# Patient Record
Sex: Male | Born: 1950
Health system: Southern US, Community
[De-identification: ages and names within clinical notes are randomized; demographics above are authoritative.]

## PROBLEM LIST (undated history)

## (undated) DIAGNOSIS — C801 Malignant (primary) neoplasm, unspecified: Secondary | ICD-10-CM

## (undated) DIAGNOSIS — Q231 Congenital insufficiency of aortic valve: Secondary | ICD-10-CM

## (undated) DIAGNOSIS — I350 Nonrheumatic aortic (valve) stenosis: Secondary | ICD-10-CM

## (undated) DIAGNOSIS — I2510 Atherosclerotic heart disease of native coronary artery without angina pectoris: Secondary | ICD-10-CM

## (undated) DIAGNOSIS — I10 Essential (primary) hypertension: Secondary | ICD-10-CM

## (undated) DIAGNOSIS — C61 Malignant neoplasm of prostate: Secondary | ICD-10-CM

## (undated) DIAGNOSIS — R519 Headache, unspecified: Secondary | ICD-10-CM

## (undated) DIAGNOSIS — I726 Aneurysm of vertebral artery: Secondary | ICD-10-CM

## (undated) DIAGNOSIS — S065X9A Traumatic subdural hemorrhage with loss of consciousness of unspecified duration, initial encounter: Secondary | ICD-10-CM

## (undated) DIAGNOSIS — Q2381 Bicuspid aortic valve: Secondary | ICD-10-CM

## (undated) DIAGNOSIS — S065XAA Traumatic subdural hemorrhage with loss of consciousness status unknown, initial encounter: Secondary | ICD-10-CM

## (undated) DIAGNOSIS — Z8719 Personal history of other diseases of the digestive system: Secondary | ICD-10-CM

## (undated) DIAGNOSIS — Z952 Presence of prosthetic heart valve: Secondary | ICD-10-CM

## (undated) DIAGNOSIS — M1990 Unspecified osteoarthritis, unspecified site: Secondary | ICD-10-CM

## (undated) DIAGNOSIS — I7121 Aneurysm of the ascending aorta, without rupture: Secondary | ICD-10-CM

## (undated) DIAGNOSIS — K219 Gastro-esophageal reflux disease without esophagitis: Secondary | ICD-10-CM

## (undated) HISTORY — DX: Bicuspid aortic valve: Q23.81

## (undated) HISTORY — DX: Aneurysm of vertebral artery: I72.6

## (undated) HISTORY — DX: Essential (primary) hypertension: I10

## (undated) HISTORY — PX: VASCULAR SURGERY: SHX849

## (undated) HISTORY — PX: CARDIAC CATHETERIZATION: SHX172

## (undated) HISTORY — PX: KNEE ARTHROPLASTY: SHX992

## (undated) HISTORY — DX: Presence of prosthetic heart valve: Z95.2

## (undated) HISTORY — PX: SHOULDER SURGERY: SHX246

## (undated) HISTORY — DX: Malignant neoplasm of prostate: C61

## (undated) HISTORY — DX: Aneurysm of the ascending aorta, without rupture: I71.21

## (undated) HISTORY — DX: Atherosclerotic heart disease of native coronary artery without angina pectoris: I25.10

## (undated) HISTORY — DX: Nonrheumatic aortic (valve) stenosis: I35.0

## (undated) HISTORY — PX: CARPAL TUNNEL RELEASE: SHX101

## (undated) HISTORY — DX: Congenital insufficiency of aortic valve: Q23.1

---

## 1999-03-07 ENCOUNTER — Ambulatory Visit (HOSPITAL_BASED_OUTPATIENT_CLINIC_OR_DEPARTMENT_OTHER): Admission: RE | Admit: 1999-03-07 | Discharge: 1999-03-07 | Payer: Self-pay | Admitting: Orthopedic Surgery

## 2000-04-25 ENCOUNTER — Encounter: Payer: Self-pay | Admitting: Orthopedic Surgery

## 2000-04-25 ENCOUNTER — Ambulatory Visit (HOSPITAL_COMMUNITY): Admission: RE | Admit: 2000-04-25 | Discharge: 2000-04-25 | Payer: Self-pay | Admitting: Orthopedic Surgery

## 2001-05-14 ENCOUNTER — Encounter: Payer: Self-pay | Admitting: Gastroenterology

## 2001-05-14 ENCOUNTER — Encounter: Admission: RE | Admit: 2001-05-14 | Discharge: 2001-05-14 | Payer: Self-pay | Admitting: Gastroenterology

## 2002-07-28 ENCOUNTER — Observation Stay (HOSPITAL_COMMUNITY): Admission: EM | Admit: 2002-07-28 | Discharge: 2002-07-28 | Payer: Self-pay | Admitting: *Deleted

## 2002-07-28 ENCOUNTER — Encounter: Payer: Self-pay | Admitting: *Deleted

## 2003-11-05 ENCOUNTER — Emergency Department (HOSPITAL_COMMUNITY): Admission: EM | Admit: 2003-11-05 | Discharge: 2003-11-05 | Payer: Self-pay | Admitting: Emergency Medicine

## 2004-03-02 ENCOUNTER — Ambulatory Visit (HOSPITAL_COMMUNITY): Admission: RE | Admit: 2004-03-02 | Discharge: 2004-03-02 | Payer: Self-pay | Admitting: General Surgery

## 2004-03-02 ENCOUNTER — Ambulatory Visit (HOSPITAL_BASED_OUTPATIENT_CLINIC_OR_DEPARTMENT_OTHER): Admission: RE | Admit: 2004-03-02 | Discharge: 2004-03-02 | Payer: Self-pay | Admitting: General Surgery

## 2004-03-02 ENCOUNTER — Encounter (INDEPENDENT_AMBULATORY_CARE_PROVIDER_SITE_OTHER): Payer: Self-pay | Admitting: *Deleted

## 2004-08-15 ENCOUNTER — Ambulatory Visit (HOSPITAL_COMMUNITY): Admission: RE | Admit: 2004-08-15 | Discharge: 2004-08-15 | Payer: Self-pay | Admitting: Gastroenterology

## 2005-11-25 ENCOUNTER — Encounter: Admission: RE | Admit: 2005-11-25 | Discharge: 2005-11-25 | Payer: Self-pay | Admitting: General Surgery

## 2005-12-12 ENCOUNTER — Emergency Department (HOSPITAL_COMMUNITY): Admission: EM | Admit: 2005-12-12 | Discharge: 2005-12-12 | Payer: Self-pay | Admitting: Emergency Medicine

## 2006-07-11 ENCOUNTER — Ambulatory Visit: Payer: Self-pay | Admitting: Family Medicine

## 2006-07-25 ENCOUNTER — Ambulatory Visit: Payer: Self-pay | Admitting: Internal Medicine

## 2006-08-06 ENCOUNTER — Ambulatory Visit: Payer: Self-pay | Admitting: Family Medicine

## 2006-08-23 ENCOUNTER — Ambulatory Visit: Payer: Self-pay | Admitting: Family Medicine

## 2006-09-06 ENCOUNTER — Emergency Department (HOSPITAL_COMMUNITY): Admission: EM | Admit: 2006-09-06 | Discharge: 2006-09-06 | Payer: Self-pay | Admitting: Emergency Medicine

## 2006-09-07 ENCOUNTER — Ambulatory Visit: Payer: Self-pay | Admitting: Family Medicine

## 2006-12-06 ENCOUNTER — Ambulatory Visit: Payer: Self-pay | Admitting: Family Medicine

## 2007-01-08 DIAGNOSIS — M109 Gout, unspecified: Secondary | ICD-10-CM | POA: Insufficient documentation

## 2007-01-13 DIAGNOSIS — Z8739 Personal history of other diseases of the musculoskeletal system and connective tissue: Secondary | ICD-10-CM | POA: Insufficient documentation

## 2007-03-01 ENCOUNTER — Ambulatory Visit: Payer: Self-pay | Admitting: Family Medicine

## 2007-03-01 LAB — CONVERTED CEMR LAB
CO2: 31 meq/L (ref 19–32)
Chloride: 106 meq/L (ref 96–112)
Cholesterol: 220 mg/dL (ref 0–200)
Creatinine, Ser: 1.1 mg/dL (ref 0.4–1.5)
PSA: 2.31 ng/mL (ref 0.10–4.00)
Sodium: 144 meq/L (ref 135–145)
Uric Acid, Serum: 8.5 mg/dL — ABNORMAL HIGH (ref 2.4–7.0)
VLDL: 31 mg/dL (ref 0–40)

## 2007-04-22 ENCOUNTER — Ambulatory Visit: Payer: Self-pay | Admitting: Family Medicine

## 2007-07-10 ENCOUNTER — Telehealth (INDEPENDENT_AMBULATORY_CARE_PROVIDER_SITE_OTHER): Payer: Self-pay | Admitting: *Deleted

## 2007-07-11 ENCOUNTER — Telehealth (INDEPENDENT_AMBULATORY_CARE_PROVIDER_SITE_OTHER): Payer: Self-pay | Admitting: *Deleted

## 2007-07-12 ENCOUNTER — Ambulatory Visit: Payer: Self-pay | Admitting: Family Medicine

## 2007-07-12 DIAGNOSIS — N63 Unspecified lump in unspecified breast: Secondary | ICD-10-CM | POA: Insufficient documentation

## 2007-07-17 ENCOUNTER — Encounter: Admission: RE | Admit: 2007-07-17 | Discharge: 2007-07-17 | Payer: Self-pay | Admitting: Family Medicine

## 2007-11-25 ENCOUNTER — Telehealth (INDEPENDENT_AMBULATORY_CARE_PROVIDER_SITE_OTHER): Payer: Self-pay | Admitting: *Deleted

## 2007-11-26 ENCOUNTER — Ambulatory Visit: Payer: Self-pay | Admitting: Family Medicine

## 2007-12-09 ENCOUNTER — Telehealth (INDEPENDENT_AMBULATORY_CARE_PROVIDER_SITE_OTHER): Payer: Self-pay | Admitting: *Deleted

## 2007-12-16 ENCOUNTER — Telehealth (INDEPENDENT_AMBULATORY_CARE_PROVIDER_SITE_OTHER): Payer: Self-pay | Admitting: *Deleted

## 2007-12-16 ENCOUNTER — Ambulatory Visit: Payer: Self-pay | Admitting: Internal Medicine

## 2007-12-16 DIAGNOSIS — N410 Acute prostatitis: Secondary | ICD-10-CM | POA: Insufficient documentation

## 2007-12-16 LAB — CONVERTED CEMR LAB
Bilirubin Urine: NEGATIVE
Glucose, Urine, Semiquant: NEGATIVE
Specific Gravity, Urine: 1.015
pH: 5

## 2007-12-20 ENCOUNTER — Encounter (INDEPENDENT_AMBULATORY_CARE_PROVIDER_SITE_OTHER): Payer: Self-pay | Admitting: *Deleted

## 2007-12-25 ENCOUNTER — Ambulatory Visit: Payer: Self-pay | Admitting: Family Medicine

## 2007-12-31 ENCOUNTER — Encounter (INDEPENDENT_AMBULATORY_CARE_PROVIDER_SITE_OTHER): Payer: Self-pay | Admitting: *Deleted

## 2007-12-31 ENCOUNTER — Telehealth (INDEPENDENT_AMBULATORY_CARE_PROVIDER_SITE_OTHER): Payer: Self-pay | Admitting: *Deleted

## 2007-12-31 LAB — CONVERTED CEMR LAB
CO2: 29 meq/L (ref 19–32)
GFR calc Af Amer: 80 mL/min
Glucose, Bld: 83 mg/dL (ref 70–99)
HDL: 66.4 mg/dL (ref >39.0)
Potassium: 4.9 meq/L (ref 3.5–5.1)
Sodium: 140 meq/L (ref 135–145)
Triglycerides: 126 mg/dL (ref 0–149)

## 2008-01-02 ENCOUNTER — Encounter (INDEPENDENT_AMBULATORY_CARE_PROVIDER_SITE_OTHER): Payer: Self-pay | Admitting: *Deleted

## 2008-01-03 ENCOUNTER — Telehealth (INDEPENDENT_AMBULATORY_CARE_PROVIDER_SITE_OTHER): Payer: Self-pay | Admitting: *Deleted

## 2008-01-07 ENCOUNTER — Encounter: Payer: Self-pay | Admitting: Internal Medicine

## 2008-01-16 ENCOUNTER — Encounter (INDEPENDENT_AMBULATORY_CARE_PROVIDER_SITE_OTHER): Payer: Self-pay | Admitting: Family Medicine

## 2008-02-14 ENCOUNTER — Ambulatory Visit: Payer: Self-pay | Admitting: Family Medicine

## 2008-02-14 DIAGNOSIS — F4329 Adjustment disorder with other symptoms: Secondary | ICD-10-CM | POA: Insufficient documentation

## 2008-02-14 DIAGNOSIS — M62838 Other muscle spasm: Secondary | ICD-10-CM | POA: Insufficient documentation

## 2008-02-17 LAB — CONVERTED CEMR LAB
Chloride: 104 meq/L (ref 96–112)
Creatinine, Ser: 1.3 mg/dL (ref 0.4–1.5)
Glucose, Bld: 127 mg/dL — ABNORMAL HIGH (ref 70–99)
Sodium: 143 meq/L (ref 135–145)

## 2008-02-18 ENCOUNTER — Telehealth (INDEPENDENT_AMBULATORY_CARE_PROVIDER_SITE_OTHER): Payer: Self-pay | Admitting: *Deleted

## 2008-02-20 ENCOUNTER — Encounter (INDEPENDENT_AMBULATORY_CARE_PROVIDER_SITE_OTHER): Payer: Self-pay | Admitting: *Deleted

## 2008-02-27 ENCOUNTER — Telehealth (INDEPENDENT_AMBULATORY_CARE_PROVIDER_SITE_OTHER): Payer: Self-pay | Admitting: *Deleted

## 2008-03-10 ENCOUNTER — Ambulatory Visit: Payer: Self-pay | Admitting: Internal Medicine

## 2008-03-10 DIAGNOSIS — T887XXA Unspecified adverse effect of drug or medicament, initial encounter: Secondary | ICD-10-CM | POA: Insufficient documentation

## 2008-03-10 DIAGNOSIS — Q828 Other specified congenital malformations of skin: Secondary | ICD-10-CM | POA: Insufficient documentation

## 2008-04-13 ENCOUNTER — Telehealth: Payer: Self-pay | Admitting: Internal Medicine

## 2008-12-02 ENCOUNTER — Ambulatory Visit: Payer: Self-pay | Admitting: Family Medicine

## 2008-12-02 LAB — CONVERTED CEMR LAB: Uric Acid, Serum: 9.9 mg/dL — ABNORMAL HIGH (ref 4.0–7.8)

## 2008-12-03 ENCOUNTER — Encounter (INDEPENDENT_AMBULATORY_CARE_PROVIDER_SITE_OTHER): Payer: Self-pay | Admitting: *Deleted

## 2009-01-08 ENCOUNTER — Encounter: Payer: Self-pay | Admitting: Family Medicine

## 2009-01-23 ENCOUNTER — Telehealth: Payer: Self-pay | Admitting: Family Medicine

## 2009-01-25 ENCOUNTER — Ambulatory Visit: Payer: Self-pay | Admitting: Family Medicine

## 2009-01-25 DIAGNOSIS — R109 Unspecified abdominal pain: Secondary | ICD-10-CM | POA: Insufficient documentation

## 2009-01-25 DIAGNOSIS — R319 Hematuria, unspecified: Secondary | ICD-10-CM | POA: Insufficient documentation

## 2009-01-25 LAB — CONVERTED CEMR LAB
ALT: 28 units/L (ref 0–53)
Albumin: 3.8 g/dL (ref 3.5–5.2)
BUN: 12 mg/dL (ref 6–23)
Basophils Absolute: 0.1 10*3/uL (ref 0.0–0.1)
Basophils Relative: 0.9 % (ref 0.0–3.0)
CO2: 31 meq/L (ref 19–32)
Calcium: 9.4 mg/dL (ref 8.4–10.5)
Creatinine, Ser: 1.2 mg/dL (ref 0.4–1.5)
Eosinophils Relative: 1.3 % (ref 0.0–5.0)
Glucose, Bld: 91 mg/dL (ref 70–99)
Hemoglobin: 14.2 g/dL (ref 13.0–17.0)
Ketones, urine, test strip: NEGATIVE
Lymphocytes Relative: 10.6 % — ABNORMAL LOW (ref 12.0–46.0)
MCHC: 34.3 g/dL (ref 30.0–36.0)
Neutro Abs: 8.4 10*3/uL — ABNORMAL HIGH (ref 1.4–7.7)
Nitrite: NEGATIVE
RBC: 4.38 M/uL (ref 4.22–5.81)
Sed Rate: 77 mm/hr — ABNORMAL HIGH (ref 0–16)
Total Bilirubin: 0.9 mg/dL (ref 0.3–1.2)
Total Protein: 7.1 g/dL (ref 6.0–8.3)
Urobilinogen, UA: 0.2

## 2009-01-26 ENCOUNTER — Encounter: Payer: Self-pay | Admitting: Family Medicine

## 2009-01-27 ENCOUNTER — Telehealth (INDEPENDENT_AMBULATORY_CARE_PROVIDER_SITE_OTHER): Payer: Self-pay | Admitting: *Deleted

## 2009-01-28 ENCOUNTER — Encounter: Payer: Self-pay | Admitting: Family Medicine

## 2009-11-02 ENCOUNTER — Telehealth: Payer: Self-pay | Admitting: Family Medicine

## 2011-01-07 ENCOUNTER — Encounter: Payer: Self-pay | Admitting: General Surgery

## 2011-05-05 NOTE — Assessment & Plan Note (Signed)
[facility]                        [facility] OFFICE NOTE   NAME:Jose [name], Jose [name]                      MRN:          [id number]  DATE:04/22/2007                            DOB:          1951-11-15    REASON FOR VISIT:  Cough x3-4 weeks.   Mr. Terhune is a 60 year old male who is having recurrent cough over the  last 3-4 weeks.  He reports that it becomes very productive in nature.  Sometimes, he gets into a significant fit that he cannot stop  coughing.  He gets short of breath with these episodes.  Several weeks  ago, he reported that he was coughing so heavily and hard that he  passed out.  He has not had any recurrence of that.  The patient does  report significant post nasal drip with nasal congestion.  He also  complains of sneezing.  The patient is not taking anything for  allergies.  He denies any fever or chills.   The patient also has a history of gout and complaints of gout flare up  of his right foot.  He states usually when he misses a couple of doses  of his Allopurinol or eats food that helps precipitate his gout, he gets  a flare up.  Of note, Mr. Parke has not followed up as recommended  with rheumatology as of yet.   MEDICATIONS:  Please see medication list.   ALLERGIES:  No known drug allergies.   REVIEW OF SYSTEMS:  As per HPI.  The patient denies any chest pain,  palpitations, nausea, vomiting or diarrhea.   OBJECTIVE:  VITAL SIGNS:  Weight 206.8, temperature 98.7, pulse 76,  respirations 18, blood pressure 120/90.  GENERAL:  A pleasant male in no acute distress with minimal coughing  during the examination.  HEENT:  Nasal mucosal was very swollen with cobblestone appearance,  clear to yellow nasal discharge.  Oropharynx slightly erythematous with  post nasal drip.  NECK:  Supple.  No lymphadenopathy, carotid bruits or JVD.  LUNGS:  Clear with good air movement.  No rhonchi, wheezing or crackles.  HEART:   Regular rate and rhythm, normal S1, S2, no murmurs, rubs or  gallops.  EXTREMITIES:  Examination of the right foot is significant for increased  redness around the first digit, mild swelling up to the base of the  first metatarsal, mildly increased warmth.   IMPRESSION:  8. A 60 year old male with recurrent history of coughing.  It appears      that it is complicated with allergic rhinitis.  2. Gout of the great right toe similar to previous occurrence.   PLAN:  1. In regards to his cough, I advised the patient will treat      empirically as allergic rhinitis.  The patient will start on      Allegra 180 mg daily.  I also provided samples of Nasonex two      squirts in each nostril daily.  Will also treat with Z-pack given      the history of previous bronchitis.  2. Advise the patient that I will refer to  pulmonology for further      assessment given the repetitive nature of his symptoms.  3. In regards to his gout, I will refill the patient's Indocin 50 mg      t.i.d. p.r.n.  He will hold off on the Allopurinol for now and      restart in 7-10 days.  I advised the patient if the symptoms worsen      or do not improve, he is to follow up.  4. Advise the patient that he should follow up with rheumatology as      previously advised.  5. I did review my medical records and it shows that I referred the      patient to allergy and asthma on January 24, 2007, but it does not      appear that Mr. Defrank went to that appointment based on our      discussion today.     Leanne Chang, M.D.  Electronically Signed    LA/MedQ  DD: 04/22/2007  DT: 04/23/2007  Job #: [id number]

## 2011-05-05 NOTE — Assessment & Plan Note (Signed)
[facility]                        [facility] OFFICE NOTE   NAME:Jose [name], Jose [name]                      MRN:          [id number]  DATE:03/01/2007                            DOB:          Feb 03, 1951    REASON FOR VISIT:  Would like his prostate checked.   Mr. Strollo is a 60 year old male who presents today reporting that he  wants a prostate screen.  Additionally he would like to have his uric  acid level checked.  He does have a history of gout which has not flared  up in a while.   The patient also has a family history of diabetes and would like a  fasting glucose performed as well.   The patient also reports that he has been having trouble with allergies.  Complains of sneezing, postnasal drip, and a tickle in his throat that  causes a cough.  The patient states that he prefers not to take any  medications for his allergies.  He describes the symptoms as an  annoyance.   MEDICATIONS:  Allopurinol 100 mg b.i.d.   ALLERGIES:  NO KNOWN DRUG ALLERGIES.   REVIEW OF SYSTEMS:  As per HPI, otherwise unremarkable.   OBJECTIVE:  Weight 208.8, pulse 78, blood pressure 120/84.  We have a  pleasant male in no acute distress, asks questions appropriately.  HEENT:  Nasal mucosa was foggy, cobblestone appearance with clear to  yellow nasal discharge.  Oropharynx benign except for postnasal drip.  LUNGS:  Clear.  HEART:  Regular rate and rhythm.  RECTAL:  Significant for normal tone, prostate within normal limits, no  palpable masses.   IMPRESSION:  1. Allergic rhinitis.  2. Prostate cancer screen.  3. Family history of diabetes.   PLAN:  1. Refill the patient's prescription for Allopurinol 100 mg b.i.d.      with 6 refills.  Will check a BMET and uric acid level.  2. In regards to family history of diabetes, we will do a fasting      glucose.  Additionally we will do a fasting lipid profile.  3. In regards to his allergies the patient did  agree to try a nasal      spray.  Did provide samples of      Nasacort AQ, 2 squirts in each nostril daily.  The patient to call      the office in 10 days to let      [place] know if medication is helping.  4. The patient to follow up as needed in the interim.     Leanne Chang, M.D.  Electronically Signed    LA/MedQ  DD: 03/01/2007  DT: 03/02/2007  Job #: [id number]

## 2011-05-05 NOTE — Op Note (Signed)
NAME:  Jose [name], Jose [name]                         ACCOUNT NO.:  [id number]   MEDICAL RECORD NO.:  [id number]                   PATIENT TYPE:  AMB   LOCATION:  ENDO                                 FACILITY:  [facility]   PHYSICIAN:  James L. Malon Kindle., M.D.          DATE OF BIRTH:  [birth year]   DATE OF PROCEDURE:  08/15/2004  DATE OF DISCHARGE:                                 OPERATIVE REPORT   PROCEDURE:  Esophagogastroduodenoscopy.   MEDICATIONS:  Cetacaine spray, fentanyl 50 mcg, Versed 6 mg IV.   INDICATIONS:  Worsened esophageal reflux and pain, heartburn daily.   DESCRIPTION OF PROCEDURE:  The procedure had been explained to the patient  and consent obtained.  With the patient in the left lateral decubitus  position, the Olympus scope was inserted and advanced under direct  visualization.  The esophagus was entered.  The distal esophagus was  slightly inflamed, and right at the GE junction was a shallow ulcer with  smooth borders consistent with a reflux-induced ulcer.  The GE junction was  widely patent.  There was a small hiatal hernia.  The stomach was entered,  the pylorus identified and passed.  The duodenum, including the bulb and  second portion, were seen well and were unremarkable.  The scope was  withdrawn back in the stomach.  The pyloric channel, antrum, and body of the  stomach were normal.  The fundus and cardia were seen well on the  retroflexed view and were normal.  The scope was withdrawn.  Initial  findings on entry were confirmed.  The proximal esophagus was seen well and  was normal.  The scope was withdrawn.  The patient tolerated the procedure  well.   ASSESSMENT:  Esophageal ulcer, probably secondary to reflux.   PLAN:  Will continue on Aciphex and Reglan, give antireflux instructions.  Will see back in the office in six weeks.                                               James L. Malon Kindle., M.D.    Waldron Session  D:  08/15/2004  T:  08/16/2004  Job:   [id number]   cc:   Leanne Chang, M.D.  [address]  [place]  [place] [address]  Fax: [phone]

## 2011-05-05 NOTE — Cardiovascular Report (Signed)
NAME:  Jose [name], Jose [name]                         ACCOUNT NO.:  [id number]   MEDICAL RECORD NO.:  [id number]                   PATIENT TYPE:  INP   LOCATION:  5735                                 FACILITY:  [facility]   PHYSICIAN:  Thereasa Solo. Little, M.D.              DATE OF BIRTH:  November 21, 1951   DATE OF PROCEDURE:  07/28/2002  DATE OF DISCHARGE:  07/28/2002                              CARDIAC CATHETERIZATION   INDICATIONS FOR PROCEDURE:  The patient is a 60 year old male who developed  left anterior chest pain around 11 o'clock p.m. associated with diaphoresis  and nausea. He states that the pain is completely different from his usual  indigestion pain, was non pleuritic. He presented to the emergency room  around 5 o'clock in the morning.  ECG, no acute changes, but continued to  have chest pain. Because of this, he is brought to the catheterization  laboratory.   DESCRIPTION OF PROCEDURE:  The patient was prepped and draped in the usual  sterile fashion exposing the right groin. Following local anesthetic with 1%  Xylocaine, the Seldinger technique was employed and a 5 [place] introducer  sheath was placed into the right femoral artery. Left and right coronary  arteriography and ventriculography in the RAO projection was performed.   COMPLICATIONS:  None.   EQUIPMENT:  The 5 French Judkins configuration catheters.   RESULTS:  1. Hemodynamic monitoring:  Central aortic pressure 134/80 left ventricular     pressure 134/158 with no aortic valve gradient noted at time of pullback.  2. Ventriculography:  Ventriculography in the RAO projection and LAO     projection using 25 cc of contrast at 12 cc/sec. revealed mild global LV     dysfunction.  The ejection fraction calculated at 48%. The apex seemed to     be slightly more hypokinetic than the remaining segments. The only normal     segment was the anterobasilar segment. No mitral regurgitation was seen.     LV cavity size appeared  normal.   CORONARY ARTERIOGRAPHY:  No calcification was seen on fluoroscopy.  1. Left main:  Normal.  2. LAD:  The LAD extended down to the apex of the heart and the distal LAD     was relatively small in diameter. The mid and proximal LAD was tortuous.     There were two diagonals. This entire system was basically free of     disease.  3. Circumflex:  The circumflex gave rise to one large OM vessel with two     little small OMs coming off in the more proximal segment of the     circumflex. This system was free of disease.  4. Right coronary artery:  The right coronary artery, the posterior     descending artery and the posterolateral branch were free of disease.   CONCLUSION:  1. No coronary artery disease  2. Decreased ejection fraction at 48%  with mild global left ventricular     dysfunction, etiology unclear.    PLAN:  The patient will be discharged today.  The pain appears to be  noncardiac in origin. Will start on low-dose ACE inhibitors to see if we can  increase the LV function.                                                Thereasa Solo. Little, M.D.    ABL/MEDQ  D:  07/28/2002  T:  07/31/2002  Job:  [id number]   cc:   Cardiac Catheterization Laboratory   Lilyan Punt. Sydnee Levans, M.D.

## 2011-05-05 NOTE — Assessment & Plan Note (Signed)
[facility]                                   ON-CALL NOTE   NAME:[name], [name]                      MRN:          [id number]  DATE:08/04/2006                            DOB:          [birth year]    Patient of Willow Ora, MD   The patient is calling because he has had a problem with two weeks of gout.  He had a gouty flare and went to see Dr. Alwyn Ren.  Dr. Alwyn Ren stopped his  allopurinol, put him on Indocin and prednisone pain pills.  He then saw his  primary care physician, Dr. Blossom Hoops, last week, who restarted his  allopurinol and told him to continue to take the prednisone and the Indocin.  His prednisone dose is 5 mg one tablet twice a day.  He is still having gout  but it is now in his finger, where he had old trauma.  He has no symptoms of  an infection.   We will up his prednisone over the weekend.  He may need more steroids to  get the inflammatory response to settle down.  We will give him six 5 mg  tablets, that will be 30 mg now stat, 30 mg Sunday morning, and then he is  to go to the office at 8:15 Monday morning to see his primary care physician  for evaluation.  I advised him he may need that joint injected if oral  medications do not work.  I also called him in Vicodin ES #20 one q.4-6h.  p.r.n. for pain, no refills.  Advised in the future would not be able to  call in narcotics over the weekend.  He would need to get a supply of pain  medicine to keep at home.                                   Jeffrey A. Tawanna Cooler, MD   JAT/MedQ  DD:  08/04/2006  DT:  08/04/2006  Job #:  [id number]   cc:   Willow Ora, MD

## 2011-05-05 NOTE — Assessment & Plan Note (Signed)
[facility]                          [facility] OFFICE NOTE   NAME:Jose [name], Jose [name]                      MRN:          [id number]  DATE:08/06/2006                            DOB:          02/17/1951    REASON FOR VISIT:  Swollen index finger/gout.   Jose [name] is a 60 year old male, who was seen by my partner several weeks  ago, secondary to acute gouty arthritis.  He was started on prednisone, as  well as discontinued from his allopurinol.  Jose [name] reports that, soon  after stopping the allopurinol, he noticed that his arthralgias increased.  He noticed significant swelling in his left index finger and called our  office.  At that point, I went ahead and called in additional prescription  for Indocin for him to take for three more days, since he ran out of it.  After that, he was to start the allopurinol.  Jose [name] reports that he  noticed that the left index finger became more swollen after he restarted  the allopurinol and he called the doctor on call this past weekend.  The  patient was advised to restart the prednisone which he had left over from  the prescription given by Dr. Alwyn Ren.  He states yesterday he had noticed  significant improvement in the left index finger and the pain has improved  slightly.  Of note, this is the finger he had fractured several months ago.   MEDICATIONS:  Indocin and prednisone, which he believes is 5 mg tablets.   OBJECTIVE:  Examination of the left hand is significant for a swollen second  digit from the PIP joint down.  There is no significant increased warmth,  but there is redness and tenderness.   IMPRESSION:  Acute gouty arthritis, responded with one to two-day treatment  of prednisone.   PLAN:  1. We will continue prednisone at 60 mg daily for five days, taper down to      40 for three days, then 20 for two days, then 10 for two days.  We will      have the patient hold allopurinol for  now.  2. I did provide a prescription for Ultram ER 200 mg daily #15 with no      refills.  He was also provided two sample packs.  Side effects were      reviewed with the patient, including sedation.  3. He is to follow up within ten days or sooner, if he noticed worsening      symptoms or no significant improvement over the next 48 hours.                                   Leanne Chang, MD   LA/MedQ  DD:  08/06/2006  DT:  08/07/2006  Job #:  [id number]

## 2011-05-05 NOTE — Op Note (Signed)
NAME:  Jose [name], Jose [name]                         ACCOUNT NO.:  [id number]   MEDICAL RECORD NO.:  [id number]                   PATIENT TYPE:  AMB   LOCATION:  DSC                                  FACILITY:  [facility]   PHYSICIAN:  Ollen Gross. Vernell Morgans, M.D.              DATE OF BIRTH:  [birth year]   DATE OF PROCEDURE:  03/02/2004  DATE OF DISCHARGE:  03/02/2004                                 OPERATIVE REPORT   PREOPERATIVE DIAGNOSES:  Two small masses on the left buttock.   POSTOPERATIVE DIAGNOSES:  Two small masses on the left buttock.   OPERATION PERFORMED:  Excision of two masses from the left buttock.   SURGEON:  Ollen Gross. Carolynne Edouard, M.D.   ANESTHESIA:  Local.   DESCRIPTION OF PROCEDURE:  After informed consent was obtained, the patient  was brought to the operating room and placed in prone position on the  operating table.  The area in question on the patient's left buttock was  prepped with Betadine and draped in the usual sterile manner.  The more  lateral lesion was approximately 1 cm in diameter and the more medial lesion  was about 5 mm in diameter.  Each of these areas was infiltrated with 1%  lidocaine with epinephrine until a good field block was obtained.  Each area  was then excised sharply with a 15 blade knife using an elliptical type  incision which was carried down full thickness into the fat.  Each specimen  was sent to pathology for further evaluation.  Each of the incisions was  closed with interrupted 4-0 Monocryl subcuticular stitches and then covered  with Dermabond dressing.  The patient tolerated the procedure well.  At the  end of the case all sponge, needle and instrument counts were correct.  The  patient was taken to the recovery room in stable condition.                                               Ollen Gross. Vernell Morgans, M.D.    PST/MEDQ  D:  03/05/2004  T:  03/07/2004  Job:  [id number]

## 2014-12-18 HISTORY — PX: BRAIN SURGERY: SHX531

## 2015-02-16 DIAGNOSIS — I6200 Nontraumatic subdural hemorrhage, unspecified: Secondary | ICD-10-CM | POA: Diagnosis not present

## 2015-02-16 DIAGNOSIS — R5383 Other fatigue: Secondary | ICD-10-CM | POA: Diagnosis not present

## 2015-02-16 DIAGNOSIS — I6202 Nontraumatic subacute subdural hemorrhage: Secondary | ICD-10-CM | POA: Diagnosis not present

## 2015-02-16 DIAGNOSIS — R531 Weakness: Secondary | ICD-10-CM | POA: Diagnosis not present

## 2015-02-16 DIAGNOSIS — M109 Gout, unspecified: Secondary | ICD-10-CM | POA: Diagnosis not present

## 2015-02-16 DIAGNOSIS — M79602 Pain in left arm: Secondary | ICD-10-CM | POA: Diagnosis not present

## 2015-02-16 DIAGNOSIS — Z125 Encounter for screening for malignant neoplasm of prostate: Secondary | ICD-10-CM | POA: Diagnosis not present

## 2015-02-16 DIAGNOSIS — R29898 Other symptoms and signs involving the musculoskeletal system: Secondary | ICD-10-CM | POA: Diagnosis not present

## 2015-02-16 DIAGNOSIS — I729 Aneurysm of unspecified site: Secondary | ICD-10-CM | POA: Diagnosis not present

## 2015-02-16 DIAGNOSIS — Z8612 Personal history of poliomyelitis: Secondary | ICD-10-CM | POA: Diagnosis not present

## 2015-02-16 DIAGNOSIS — M6281 Muscle weakness (generalized): Secondary | ICD-10-CM | POA: Diagnosis not present

## 2015-02-16 DIAGNOSIS — B91 Sequelae of poliomyelitis: Secondary | ICD-10-CM | POA: Diagnosis not present

## 2015-02-16 DIAGNOSIS — K449 Diaphragmatic hernia without obstruction or gangrene: Secondary | ICD-10-CM | POA: Diagnosis not present

## 2015-02-16 DIAGNOSIS — I671 Cerebral aneurysm, nonruptured: Secondary | ICD-10-CM | POA: Diagnosis not present

## 2015-02-16 DIAGNOSIS — T148 Other injury of unspecified body region: Secondary | ICD-10-CM | POA: Diagnosis not present

## 2015-02-24 DIAGNOSIS — K449 Diaphragmatic hernia without obstruction or gangrene: Secondary | ICD-10-CM | POA: Diagnosis not present

## 2015-02-24 DIAGNOSIS — I6789 Other cerebrovascular disease: Secondary | ICD-10-CM | POA: Diagnosis not present

## 2015-02-24 DIAGNOSIS — G40109 Localization-related (focal) (partial) symptomatic epilepsy and epileptic syndromes with simple partial seizures, not intractable, without status epilepticus: Secondary | ICD-10-CM | POA: Diagnosis not present

## 2015-02-24 DIAGNOSIS — R4781 Slurred speech: Secondary | ICD-10-CM | POA: Diagnosis not present

## 2015-02-24 DIAGNOSIS — K219 Gastro-esophageal reflux disease without esophagitis: Secondary | ICD-10-CM | POA: Diagnosis not present

## 2015-02-24 DIAGNOSIS — R7989 Other specified abnormal findings of blood chemistry: Secondary | ICD-10-CM | POA: Diagnosis not present

## 2015-02-24 DIAGNOSIS — G459 Transient cerebral ischemic attack, unspecified: Secondary | ICD-10-CM | POA: Diagnosis not present

## 2015-02-24 DIAGNOSIS — Z8679 Personal history of other diseases of the circulatory system: Secondary | ICD-10-CM | POA: Diagnosis not present

## 2015-02-24 DIAGNOSIS — Z79899 Other long term (current) drug therapy: Secondary | ICD-10-CM | POA: Diagnosis not present

## 2015-02-24 DIAGNOSIS — M109 Gout, unspecified: Secondary | ICD-10-CM | POA: Diagnosis not present

## 2015-02-24 DIAGNOSIS — I10 Essential (primary) hypertension: Secondary | ICD-10-CM | POA: Diagnosis not present

## 2015-02-24 DIAGNOSIS — Z9889 Other specified postprocedural states: Secondary | ICD-10-CM | POA: Diagnosis not present

## 2015-02-24 DIAGNOSIS — R930 Abnormal findings on diagnostic imaging of skull and head, not elsewhere classified: Secondary | ICD-10-CM | POA: Diagnosis not present

## 2015-02-25 DIAGNOSIS — G459 Transient cerebral ischemic attack, unspecified: Secondary | ICD-10-CM | POA: Diagnosis not present

## 2015-02-25 DIAGNOSIS — R4781 Slurred speech: Secondary | ICD-10-CM | POA: Diagnosis not present

## 2015-02-25 DIAGNOSIS — R569 Unspecified convulsions: Secondary | ICD-10-CM | POA: Diagnosis not present

## 2015-02-25 DIAGNOSIS — I083 Combined rheumatic disorders of mitral, aortic and tricuspid valves: Secondary | ICD-10-CM | POA: Diagnosis not present

## 2015-02-26 DIAGNOSIS — Z0000 Encounter for general adult medical examination without abnormal findings: Secondary | ICD-10-CM | POA: Diagnosis not present

## 2015-02-26 DIAGNOSIS — M109 Gout, unspecified: Secondary | ICD-10-CM | POA: Diagnosis not present

## 2015-02-26 DIAGNOSIS — R972 Elevated prostate specific antigen [PSA]: Secondary | ICD-10-CM | POA: Diagnosis not present

## 2015-02-26 DIAGNOSIS — Z8679 Personal history of other diseases of the circulatory system: Secondary | ICD-10-CM | POA: Diagnosis not present

## 2015-03-16 DIAGNOSIS — I6200 Nontraumatic subdural hemorrhage, unspecified: Secondary | ICD-10-CM | POA: Diagnosis not present

## 2015-03-18 DIAGNOSIS — Z8679 Personal history of other diseases of the circulatory system: Secondary | ICD-10-CM | POA: Diagnosis not present

## 2015-03-19 DIAGNOSIS — R972 Elevated prostate specific antigen [PSA]: Secondary | ICD-10-CM | POA: Diagnosis not present

## 2015-03-19 DIAGNOSIS — N401 Enlarged prostate with lower urinary tract symptoms: Secondary | ICD-10-CM | POA: Diagnosis not present

## 2015-04-12 DIAGNOSIS — R9431 Abnormal electrocardiogram [ECG] [EKG]: Secondary | ICD-10-CM | POA: Diagnosis not present

## 2015-04-12 DIAGNOSIS — Z8679 Personal history of other diseases of the circulatory system: Secondary | ICD-10-CM | POA: Diagnosis not present

## 2015-04-12 DIAGNOSIS — R251 Tremor, unspecified: Secondary | ICD-10-CM | POA: Diagnosis not present

## 2015-04-12 DIAGNOSIS — R4182 Altered mental status, unspecified: Secondary | ICD-10-CM | POA: Diagnosis not present

## 2015-04-12 DIAGNOSIS — I444 Left anterior fascicular block: Secondary | ICD-10-CM | POA: Diagnosis not present

## 2015-04-12 DIAGNOSIS — R51 Headache: Secondary | ICD-10-CM | POA: Diagnosis not present

## 2015-04-12 DIAGNOSIS — R479 Unspecified speech disturbances: Secondary | ICD-10-CM | POA: Diagnosis not present

## 2015-04-13 DIAGNOSIS — G40909 Epilepsy, unspecified, not intractable, without status epilepticus: Secondary | ICD-10-CM | POA: Diagnosis not present

## 2015-04-13 DIAGNOSIS — G14 Postpolio syndrome: Secondary | ICD-10-CM | POA: Diagnosis not present

## 2015-04-13 DIAGNOSIS — I77810 Thoracic aortic ectasia: Secondary | ICD-10-CM | POA: Diagnosis not present

## 2015-04-13 DIAGNOSIS — I444 Left anterior fascicular block: Secondary | ICD-10-CM | POA: Diagnosis not present

## 2015-04-13 DIAGNOSIS — I700 Atherosclerosis of aorta: Secondary | ICD-10-CM | POA: Diagnosis not present

## 2015-04-13 DIAGNOSIS — R51 Headache: Secondary | ICD-10-CM | POA: Diagnosis not present

## 2015-04-13 DIAGNOSIS — R4781 Slurred speech: Secondary | ICD-10-CM | POA: Diagnosis not present

## 2015-04-13 DIAGNOSIS — Q231 Congenital insufficiency of aortic valve: Secondary | ICD-10-CM | POA: Diagnosis not present

## 2015-04-13 DIAGNOSIS — I6200 Nontraumatic subdural hemorrhage, unspecified: Secondary | ICD-10-CM | POA: Diagnosis not present

## 2015-04-13 DIAGNOSIS — H539 Unspecified visual disturbance: Secondary | ICD-10-CM | POA: Diagnosis not present

## 2015-04-13 DIAGNOSIS — Q282 Arteriovenous malformation of cerebral vessels: Secondary | ICD-10-CM | POA: Diagnosis not present

## 2015-04-13 DIAGNOSIS — R251 Tremor, unspecified: Secondary | ICD-10-CM | POA: Diagnosis not present

## 2015-04-13 DIAGNOSIS — R9431 Abnormal electrocardiogram [ECG] [EKG]: Secondary | ICD-10-CM | POA: Diagnosis not present

## 2015-04-13 DIAGNOSIS — G459 Transient cerebral ischemic attack, unspecified: Secondary | ICD-10-CM | POA: Diagnosis not present

## 2015-04-13 DIAGNOSIS — Q211 Atrial septal defect: Secondary | ICD-10-CM | POA: Diagnosis not present

## 2015-04-13 DIAGNOSIS — Z8673 Personal history of transient ischemic attack (TIA), and cerebral infarction without residual deficits: Secondary | ICD-10-CM | POA: Diagnosis not present

## 2015-04-13 DIAGNOSIS — Z79899 Other long term (current) drug therapy: Secondary | ICD-10-CM | POA: Diagnosis not present

## 2015-04-13 DIAGNOSIS — M109 Gout, unspecified: Secondary | ICD-10-CM | POA: Diagnosis not present

## 2015-04-14 DIAGNOSIS — R4781 Slurred speech: Secondary | ICD-10-CM | POA: Diagnosis not present

## 2015-04-14 DIAGNOSIS — I728 Aneurysm of other specified arteries: Secondary | ICD-10-CM | POA: Diagnosis not present

## 2015-04-14 DIAGNOSIS — I6200 Nontraumatic subdural hemorrhage, unspecified: Secondary | ICD-10-CM | POA: Diagnosis not present

## 2015-04-14 DIAGNOSIS — R51 Headache: Secondary | ICD-10-CM | POA: Diagnosis not present

## 2015-04-14 DIAGNOSIS — H539 Unspecified visual disturbance: Secondary | ICD-10-CM | POA: Diagnosis not present

## 2015-04-14 DIAGNOSIS — R251 Tremor, unspecified: Secondary | ICD-10-CM | POA: Diagnosis not present

## 2015-04-14 DIAGNOSIS — Q282 Arteriovenous malformation of cerebral vessels: Secondary | ICD-10-CM | POA: Diagnosis not present

## 2015-04-14 DIAGNOSIS — G14 Postpolio syndrome: Secondary | ICD-10-CM | POA: Diagnosis not present

## 2015-05-04 DIAGNOSIS — I6200 Nontraumatic subdural hemorrhage, unspecified: Secondary | ICD-10-CM | POA: Diagnosis not present

## 2015-05-10 DIAGNOSIS — Z79899 Other long term (current) drug therapy: Secondary | ICD-10-CM | POA: Diagnosis not present

## 2015-05-10 DIAGNOSIS — R52 Pain, unspecified: Secondary | ICD-10-CM | POA: Diagnosis not present

## 2015-05-14 DIAGNOSIS — M25511 Pain in right shoulder: Secondary | ICD-10-CM | POA: Diagnosis not present

## 2015-05-14 DIAGNOSIS — M1A00X0 Idiopathic chronic gout, unspecified site, without tophus (tophi): Secondary | ICD-10-CM | POA: Diagnosis not present

## 2015-05-14 DIAGNOSIS — M79671 Pain in right foot: Secondary | ICD-10-CM | POA: Diagnosis not present

## 2015-05-14 DIAGNOSIS — M79672 Pain in left foot: Secondary | ICD-10-CM | POA: Diagnosis not present

## 2015-05-17 ENCOUNTER — Encounter (HOSPITAL_COMMUNITY): Payer: Self-pay | Admitting: Emergency Medicine

## 2015-05-17 ENCOUNTER — Emergency Department (HOSPITAL_COMMUNITY)
Admission: EM | Admit: 2015-05-17 | Discharge: 2015-05-17 | Disposition: A | Discharge status: Home / Self Care | Payer: Medicare Other | Attending: Emergency Medicine | Admitting: Emergency Medicine

## 2015-05-17 DIAGNOSIS — Y9389 Activity, other specified: Secondary | ICD-10-CM | POA: Insufficient documentation

## 2015-05-17 DIAGNOSIS — X58XXXA Exposure to other specified factors, initial encounter: Secondary | ICD-10-CM | POA: Insufficient documentation

## 2015-05-17 DIAGNOSIS — Y9289 Other specified places as the place of occurrence of the external cause: Secondary | ICD-10-CM | POA: Insufficient documentation

## 2015-05-17 DIAGNOSIS — Y998 Other external cause status: Secondary | ICD-10-CM | POA: Insufficient documentation

## 2015-05-17 DIAGNOSIS — S90821A Blister (nonthermal), right foot, initial encounter: Secondary | ICD-10-CM | POA: Insufficient documentation

## 2015-05-17 DIAGNOSIS — Z8679 Personal history of other diseases of the circulatory system: Secondary | ICD-10-CM | POA: Diagnosis not present

## 2015-05-17 HISTORY — DX: Traumatic subdural hemorrhage with loss of consciousness status unknown, initial encounter: S06.5XAA

## 2015-05-17 HISTORY — DX: Traumatic subdural hemorrhage with loss of consciousness of unspecified duration, initial encounter: S06.5X9A

## 2015-05-17 MED ORDER — BACITRACIN 500 UNIT/GM EX OINT
1.0000 | TOPICAL_OINTMENT | Freq: Once | CUTANEOUS | Status: AC
Start: 2015-05-17 — End: 2015-05-17
  Administered 2015-05-17: 1 via TOPICAL
  Filled 2015-05-17: qty 28.4

## 2015-05-17 NOTE — ED Notes (Signed)
Pt. reports worsening pain / swelling at right foot blister with drainage onset last week .

## 2015-05-17 NOTE — Discharge Instructions (Signed)
    Blisters Blisters are fluid-filled sacs that form within the skin. Common causes of blistering are friction, burns, and exposure to irritating chemicals. The fluid in the blister protects the underlying damaged skin. Most of the time it is not recommended that you open blisters. When a blister is opened, there is an increased chance for infection. Usually, a blister will open on its own. They then dry up and peel off within 10 days. If the blister is tense and uncomfortable (painful) the fluid may be drained. If it is drained the roof of the blister should be left intact. The draining should only be done by a medical professional under aseptic conditions. Poorly fitting shoes and boots can cause blisters by being too tight or too loose. Wearing extra socks or using tape, bandages, or pads over the blister-prone area helps prevent the problem by reducing friction. Blisters heal more slowly if you have diabetes or if you have problems with your circulation. You need to be careful about medical follow-up to prevent infection. HOME CARE INSTRUCTIONS  Protect areas where blisters have formed until the skin is healed. Use a special bandage with a hole cut in the middle around the blister. This reduces pressure and friction. When the blister breaks, trim off the loose skin and keep the area clean by washing it with soap daily. Soaking the blister or broken-open blister with diluted vinegar twice daily for 15 minutes will dry it up and speed the healing. Use 3 tablespoons of white vinegar per quart of water (45 mL white vinegar per liter of water). An antibiotic ointment and a bandage can be used to cover the area after soaking.  SEEK MEDICAL CARE IF:   You develop increased redness, pain, swelling, or drainage in the blistered area.  You develop a pus-like discharge from the blistered area, chills, or a fever. MAKE SURE YOU:   Understand these instructions.  Will watch your condition.  Will get help  right away if you are not doing well or get worse. Document Released: 01/11/2005 Document Revised: 02/26/2012 Document Reviewed: 12/09/2008 Exit[employer] Patient Information 2015 [employer]. This information is not intended to replace advice given to you by your health care provider. Make sure you discuss any questions you have with your health care provider.  

## 2015-05-17 NOTE — ED Provider Notes (Signed)
CSN: [id number]     Arrival date & time 05/17/15  2249 History  This chart was scribed for non-physician provider Comer Locket, PA-C, working with Evelina Bucy, MD by Irene Pap, ED Scribe. This patient was seen in room TR07C/TR07C and patient care was started at 11:02 PM.   No chief complaint on file.  The history is provided by the patient. No language interpreter was used.  HPI Comments: Jose [name] is a 64 y.o. male with a history of gout who presents to the Emergency Department complaining of a blister to the bottom of his right foot onset one week ago. He states that he had brain surgery back in March for a subdural hematoma due to head trauma and was told by the doctor to walk a lot. He states that he developed blisters on the bottom of his feet last week with the right foot being the worst. He reports the blister on his right foot worsened in pain two days ago, with increased swelling and drainage. He reports putting Neosporin on the area to no relief. He reports that he woke up with drainage on his bed sheets and reports that pain worsens with laying down; states that the pain is burning and throbbing. He states that he saw his rheumatologist for his gout and the PA who saw him told him to see wound care for the blisters. He denies fever, chills, nausea, or vomiting. He denies history of DM, HIV, Lupus, or any other medical problems. He reports taking Keppra, allopurinol,  and Flomax daily.  Past Medical History  Diagnosis Date  . SDH (subdural hematoma)    Past Surgical History  Procedure Laterality Date  . Carpal tunnel release    . Shoulder surgery     No family history on file. History  Substance Use Topics  . Smoking status: Never Smoker   . Smokeless tobacco: Not on file  . Alcohol Use: No    Review of Systems  Constitutional: Negative for fever and chills.  Gastrointestinal: Negative for nausea and vomiting.  Musculoskeletal: Positive for arthralgias.  Skin:  Positive for wound.  All other systems reviewed and are negative.  Allergies  Review of patient's allergies indicates no known allergies.  Home Medications   Prior to Admission medications   Not on File   BP 127/84 mmHg  Pulse 81  Temp(Src) 98.4 F (36.9 C) (Oral)  Resp 16  Ht 6' (1.829 m)  Wt 192 lb 2 oz (87.147 kg)  BMI 26.05 kg/m2  SpO2 96%  Physical Exam  Constitutional: He is oriented to person, place, and time. He appears well-developed and well-nourished. No distress.  Does not have diabetes or any other immunocompromised disorders  HENT:  Head: Normocephalic and atraumatic.  Eyes: Conjunctivae and EOM are normal.  Neck: Normal range of motion. Neck supple.  Cardiovascular: Normal rate, regular rhythm and normal heart sounds.   Pulmonary/Chest: Effort normal and breath sounds normal.  Abdominal: Soft. There is no tenderness.  Musculoskeletal: Normal range of motion. He exhibits no edema.  full active ROM, neurovascularly intact  Neurological: He is alert and oriented to person, place, and time.  Skin: Skin is warm and dry.  Right foot: small area of blistering/ulceration to MTP joint, no active drainage or surrounding evidence of cellulitis; no fluctuance or other evidence of gross infection.  Psychiatric: He has a normal mood and affect. His behavior is normal.  Nursing note and vitals reviewed.   ED Course  Procedures (including critical  care time) DIAGNOSTIC STUDIES: Oxygen Saturation is 96% on room air, normal by my interpretation.    COORDINATION OF CARE: 11:05 PM-Discussed treatment plan which includes decreased exercise on the ball of feet, keeping the area dry, follow up if symptoms worsen, and topical anti-biotics with pt at bedside and pt agreed to plan.   Labs Review Labs Reviewed - No data to display  Imaging Review No results found.   EKG Interpretation None     Meds given in ED:  Medications  bacitracin ointment 1 application (1  application Topical Given 05/17/15 2332)    There are no discharge medications for this patient.  Danley Danker Vitals:   05/17/15 2254 05/17/15 2334  BP: 127/84 116/80  Pulse: 81 77  Temp: 98.4 F (36.9 C) 98.2 F (36.8 C)  TempSrc: Oral Oral  Resp: 16 18  Height: 6' (1.829 m)   Weight: 192 lb 2 oz (87.147 kg)   SpO2: 96% 98%     MDM  Vitals stable - WNL -afebrile Pt resting comfortably in ED. Patient with small, healing blister to plantar surface of right MTP. No history of diabetes, HIV or other immunocompromise. Wound secondary to exercise and activity. Given topical antibiotic and sterile dressing and discussed appropriate foot hygiene at home.  I discussed all relevant lab findings and imaging results with pt and they verbalized understanding. Discussed f/u with PCP within 48 hrs and return precautions, pt very amenable to plan.  Final diagnoses:  Blister of foot, right, initial encounter    I personally performed the services described in this documentation, which was scribed in my presence. The recorded information has been reviewed and is accurate.   Comer Locket, PA-C 05/18/15 0100  Evelina Bucy, MD 05/20/15 Tresa Moore

## 2015-05-17 NOTE — ED Notes (Signed)
Pt stable, ambulatory, states understanding of discharge instructions 

## 2015-05-19 DIAGNOSIS — F4321 Adjustment disorder with depressed mood: Secondary | ICD-10-CM | POA: Diagnosis not present

## 2015-05-21 ENCOUNTER — Ambulatory Visit: Payer: Self-pay | Admitting: Surgery

## 2015-05-21 DIAGNOSIS — F438 Other reactions to severe stress: Secondary | ICD-10-CM | POA: Diagnosis not present

## 2015-05-25 DIAGNOSIS — F438 Other reactions to severe stress: Secondary | ICD-10-CM | POA: Diagnosis not present

## 2015-05-26 DIAGNOSIS — M19041 Primary osteoarthritis, right hand: Secondary | ICD-10-CM | POA: Diagnosis not present

## 2015-05-26 DIAGNOSIS — M19042 Primary osteoarthritis, left hand: Secondary | ICD-10-CM | POA: Diagnosis not present

## 2015-05-27 DIAGNOSIS — F438 Other reactions to severe stress: Secondary | ICD-10-CM | POA: Diagnosis not present

## 2015-06-01 DIAGNOSIS — F438 Other reactions to severe stress: Secondary | ICD-10-CM | POA: Diagnosis not present

## 2015-06-03 DIAGNOSIS — F438 Other reactions to severe stress: Secondary | ICD-10-CM | POA: Diagnosis not present

## 2015-06-03 DIAGNOSIS — F4321 Adjustment disorder with depressed mood: Secondary | ICD-10-CM | POA: Diagnosis not present

## 2015-06-07 DIAGNOSIS — F438 Other reactions to severe stress: Secondary | ICD-10-CM | POA: Diagnosis not present

## 2015-06-09 DIAGNOSIS — F4321 Adjustment disorder with depressed mood: Secondary | ICD-10-CM | POA: Diagnosis not present

## 2015-06-09 DIAGNOSIS — F438 Other reactions to severe stress: Secondary | ICD-10-CM | POA: Diagnosis not present

## 2015-06-14 DIAGNOSIS — F438 Other reactions to severe stress: Secondary | ICD-10-CM | POA: Diagnosis not present

## 2015-06-16 DIAGNOSIS — F438 Other reactions to severe stress: Secondary | ICD-10-CM | POA: Diagnosis not present

## 2015-06-22 DIAGNOSIS — F438 Other reactions to severe stress: Secondary | ICD-10-CM | POA: Diagnosis not present

## 2015-06-25 DIAGNOSIS — F438 Other reactions to severe stress: Secondary | ICD-10-CM | POA: Diagnosis not present

## 2015-06-26 ENCOUNTER — Emergency Department (HOSPITAL_COMMUNITY): Payer: Medicare Other

## 2015-06-26 ENCOUNTER — Emergency Department (HOSPITAL_COMMUNITY)
Admission: EM | Admit: 2015-06-26 | Discharge: 2015-06-26 | Disposition: A | Discharge status: Home / Self Care | Payer: Medicare Other | Attending: Emergency Medicine | Admitting: Emergency Medicine

## 2015-06-26 ENCOUNTER — Encounter (HOSPITAL_COMMUNITY): Payer: Self-pay

## 2015-06-26 DIAGNOSIS — I517 Cardiomegaly: Secondary | ICD-10-CM | POA: Diagnosis not present

## 2015-06-26 DIAGNOSIS — R51 Headache: Secondary | ICD-10-CM | POA: Diagnosis not present

## 2015-06-26 DIAGNOSIS — Z8679 Personal history of other diseases of the circulatory system: Secondary | ICD-10-CM | POA: Insufficient documentation

## 2015-06-26 DIAGNOSIS — G4489 Other headache syndrome: Secondary | ICD-10-CM | POA: Diagnosis not present

## 2015-06-26 DIAGNOSIS — R05 Cough: Secondary | ICD-10-CM | POA: Insufficient documentation

## 2015-06-26 DIAGNOSIS — R059 Cough, unspecified: Secondary | ICD-10-CM

## 2015-06-26 DIAGNOSIS — I712 Thoracic aortic aneurysm, without rupture: Secondary | ICD-10-CM | POA: Diagnosis not present

## 2015-06-26 DIAGNOSIS — Z9889 Other specified postprocedural states: Secondary | ICD-10-CM | POA: Diagnosis not present

## 2015-06-26 DIAGNOSIS — I728 Aneurysm of other specified arteries: Secondary | ICD-10-CM | POA: Diagnosis not present

## 2015-06-26 LAB — CBC WITH DIFFERENTIAL/PLATELET
BASOS ABS: 0 10*3/uL (ref 0.0–0.1)
Basophils Relative: 1 % (ref 0–1)
EOS PCT: 1 % (ref 0–5)
Eosinophils Absolute: 0.1 10*3/uL (ref 0.0–0.7)
HEMATOCRIT: 43.8 % (ref 39.0–52.0)
HEMOGLOBIN: 15.3 g/dL (ref 13.0–17.0)
LYMPHS PCT: 21 % (ref 12–46)
Lymphs Abs: 1.5 10*3/uL (ref 0.7–4.0)
MCH: 31.6 pg (ref 26.0–34.0)
MCHC: 34.9 g/dL (ref 30.0–36.0)
MCV: 90.5 fL (ref 78.0–100.0)
MONO ABS: 0.6 10*3/uL (ref 0.1–1.0)
MONOS PCT: 9 % (ref 3–12)
NEUTROS ABS: 5 10*3/uL (ref 1.7–7.7)
Neutrophils Relative %: 68 % (ref 43–77)
Platelets: 184 10*3/uL (ref 150–400)
RBC: 4.84 MIL/uL (ref 4.22–5.81)
RDW: 13.8 % (ref 11.5–15.5)
WBC: 7.2 10*3/uL (ref 4.0–10.5)

## 2015-06-26 LAB — URINALYSIS, ROUTINE W REFLEX MICROSCOPIC
BILIRUBIN URINE: NEGATIVE
GLUCOSE, UA: NEGATIVE mg/dL
Hgb urine dipstick: NEGATIVE
KETONES UR: NEGATIVE mg/dL
LEUKOCYTES UA: NEGATIVE
Nitrite: NEGATIVE
PH: 6 (ref 5.0–8.0)
Protein, ur: NEGATIVE mg/dL
SPECIFIC GRAVITY, URINE: 1.011 (ref 1.005–1.030)
Urobilinogen, UA: 1 mg/dL (ref 0.0–1.0)

## 2015-06-26 LAB — BASIC METABOLIC PANEL
ANION GAP: 8 (ref 5–15)
BUN: 12 mg/dL (ref 6–20)
CALCIUM: 9 mg/dL (ref 8.9–10.3)
CHLORIDE: 105 mmol/L (ref 101–111)
CO2: 26 mmol/L (ref 22–32)
CREATININE: 1.11 mg/dL (ref 0.61–1.24)
Glucose, Bld: 112 mg/dL — ABNORMAL HIGH (ref 65–99)
Potassium: 3.7 mmol/L (ref 3.5–5.1)
Sodium: 139 mmol/L (ref 135–145)

## 2015-06-26 MED ORDER — IOHEXOL 350 MG/ML SOLN
100.0000 mL | Freq: Once | INTRAVENOUS | Status: AC | PRN
  Administered 2015-06-26: 100 mL via INTRAVENOUS

## 2015-06-26 MED ORDER — OXYCODONE-ACETAMINOPHEN 5-325 MG PO TABS
ORAL_TABLET | ORAL | Status: AC
  Filled 2015-06-26: qty 1

## 2015-06-26 MED ORDER — HYDROCOD POLST-CPM POLST ER 10-8 MG/5ML PO SUER: 5.0000 mL | Freq: Two times a day (BID) | ORAL | Status: DC | PRN

## 2015-06-26 MED ORDER — OXYCODONE-ACETAMINOPHEN 5-325 MG PO TABS
1.0000 | ORAL_TABLET | Freq: Once | ORAL | Status: AC
  Administered 2015-06-26: 1 via ORAL

## 2015-06-26 MED ORDER — HYDROCOD POLST-CPM POLST ER 10-8 MG/5ML PO SUER
5.0000 mL | Freq: Once | ORAL | Status: AC
  Administered 2015-06-26: 5 mL via ORAL
  Filled 2015-06-26: qty 5

## 2015-06-26 NOTE — ED Notes (Signed)
MD at bedside. 

## 2015-06-26 NOTE — ED Provider Notes (Signed)
Patient's chest CT results discussed with him. I spoke with the cardiothoracic surgeon on call, Dr.gherhart, and he will see the patient in follow-up for his 4.5 cm thoracic aneurysm. Patient is asymmetric at this time.  Lacretia Leigh, MD 06/26/15 [phone]

## 2015-06-26 NOTE — Discharge Instructions (Signed)

## 2015-06-26 NOTE — ED Provider Notes (Signed)
CSN: [id number]     Arrival date & time 06/26/15  1226 History   First MD Initiated Contact with Patient 06/26/15 1342     Chief Complaint  Patient presents with  . Headache     (Consider location/radiation/quality/duration/timing/severity/associated sxs/prior Treatment) Patient is a 64 y.o. male presenting with general illness.  Illness Quality:  Malaise, fatigue Severity:  Moderate Onset quality:  Gradual Duration:  3 days Timing:  Constant Progression:  Worsening Chronicity:  New Context:  Had brain surgery for SDH about 4 months ago Relieved by:  Nothing Worsened by:  Nothing Associated symptoms: cough   Associated symptoms: no chest pain, no fever, no nausea, no shortness of breath and no vomiting   Associated symptoms comment:  Right sided headache   Past Medical History  Diagnosis Date  . SDH (subdural hematoma)    Past Surgical History  Procedure Laterality Date  . Carpal tunnel release    . Shoulder surgery     No family history on file. History  Substance Use Topics  . Smoking status: Never Smoker   . Smokeless tobacco: Not on file  . Alcohol Use: No    Review of Systems  Constitutional: Negative for fever.  Respiratory: Positive for cough. Negative for shortness of breath.   Cardiovascular: Negative for chest pain.  Gastrointestinal: Negative for nausea and vomiting.  All other systems reviewed and are negative.     Allergies  Review of patient's allergies indicates no known allergies.  Home Medications   Prior to Admission medications   Not on File   BP 132/86 mmHg  Pulse 72  Temp(Src) 97.8 F (36.6 C) (Oral)  Resp 18  Ht 6' (1.829 m)  Wt 192 lb (87.091 kg)  BMI 26.03 kg/m2  SpO2 97% Physical Exam  Constitutional: He is oriented to person, place, and time. He appears well-developed and well-nourished. No distress.  HENT:  Head: Normocephalic and atraumatic.  Mouth/Throat: Oropharynx is clear and moist.  Eyes: Conjunctivae are  normal. Pupils are equal, round, and reactive to light. No scleral icterus.  Neck: Neck supple.  Cardiovascular: Normal rate, regular rhythm, normal heart sounds and intact distal pulses.   No murmur heard. Pulmonary/Chest: Effort normal and breath sounds normal. No stridor. No respiratory distress. He has no wheezes. He has no rales.  Abdominal: Soft. He exhibits no distension. There is no tenderness.  Musculoskeletal: Normal range of motion. He exhibits no edema.  Neurological: He is alert and oriented to person, place, and time. He has normal strength. No cranial nerve deficit or sensory deficit. Coordination normal. GCS eye subscore is 4. GCS verbal subscore is 5. GCS motor subscore is 6.  Skin: Skin is warm and dry. No rash noted.  Psychiatric: He has a normal mood and affect. His behavior is normal.  Nursing note and vitals reviewed.   ED Course  Procedures (including critical care time) Labs Review Labs Reviewed  CBC WITH DIFFERENTIAL/PLATELET  BASIC METABOLIC PANEL  URINALYSIS, ROUTINE W REFLEX MICROSCOPIC (NOT AT [facility])    Imaging Review Ct Head Wo Contrast  06/26/2015   CLINICAL DATA:  Headache x2 days.  History of subdural hematoma.  EXAM: CT HEAD WITHOUT CONTRAST  TECHNIQUE: Contiguous axial images were obtained from the base of the skull through the vertex without intravenous contrast.  COMPARISON:  None.  FINDINGS: Previous right posterior frontal craniotomy. Scattered dural calcifications deep to the craniotomy flap. Small anterior falcine lipoma. 11 mm partially calcified aneurysm near the junction of the left  vertebral and basilar arteries, abuts the brainstem. Mild atrophy. There is no evidence of acute intracranial hemorrhage, brain edema, acute infarction, mass effect, or midline shift. Acute infarct may be inapparent on noncontrast CT. No other intra-axial abnormalities are seen, and the ventricles and sulci are within normal limits in size and symmetry. No abnormal  extra-axial fluid collections or other masses are identified. No acute calvarial abnormality.  IMPRESSION: 1. Negative for bleed or other acute intracranial process. 2. 11 mm aneurysm near the distal left vertebral artery 3. Previous right  craniotomy   Electronically Signed   By: Lucrezia Europe M.D.   On: 06/26/2015 14:30     EKG Interpretation None      MDM   Final diagnoses:  Cough    64 yo male with malaise and fatigue.  He had brain surgery 4 months ago and is concerned because he has developed a left sided headache.  He also complains of cough, without fever.    CT head negative (he reports he knows about his aneurysm, being followed in [place]).  He had enlargement of mediastinum on chest xray.  CT pending.  Care transferred to Dr. Zenia Resides.    Serita Grit, MD 06/26/15 1655

## 2015-06-26 NOTE — ED Notes (Signed)
Pt ambulated in hall with RN.  Gait steady and even.  Pt requested prescription for cough medication, Dr. Zenia Resides provided.

## 2015-06-26 NOTE — ED Notes (Addendum)
MD made aware of pt's request for cough medicine and pain medicine for his increasing headache ("due to the cough")

## 2015-06-26 NOTE — ED Notes (Signed)
Back in march had a subdural hematoma. Since Thursday has had a headache like someone has had a hand just grabbing the right side of his head which is the same side the subdural was on. Denies any N/V. Called his MD and recommended he be seen.

## 2015-07-01 DIAGNOSIS — F4321 Adjustment disorder with depressed mood: Secondary | ICD-10-CM | POA: Diagnosis not present

## 2015-07-05 DIAGNOSIS — F438 Other reactions to severe stress: Secondary | ICD-10-CM | POA: Diagnosis not present

## 2015-07-06 ENCOUNTER — Encounter: Payer: Self-pay | Admitting: Cardiothoracic Surgery

## 2015-07-06 ENCOUNTER — Institutional Professional Consult (permissible substitution) (INDEPENDENT_AMBULATORY_CARE_PROVIDER_SITE_OTHER): Payer: Medicare Other | Admitting: Cardiothoracic Surgery

## 2015-07-06 VITALS — BP 110/70 | HR 78 | Resp 16 | Ht 72.0 in | Wt 189.0 lb

## 2015-07-06 DIAGNOSIS — I712 Thoracic aortic aneurysm, without rupture, unspecified: Secondary | ICD-10-CM

## 2015-07-06 DIAGNOSIS — R972 Elevated prostate specific antigen [PSA]: Secondary | ICD-10-CM | POA: Diagnosis not present

## 2015-07-06 DIAGNOSIS — N401 Enlarged prostate with lower urinary tract symptoms: Secondary | ICD-10-CM | POA: Diagnosis not present

## 2015-07-06 NOTE — Patient Instructions (Signed)
Thoracic Aortic Aneurysm  An aneurysm is a bulge in an artery. It happens when the wall of the artery is weakened or damaged. If the aneurysm gets too big, it bursts (ruptures) and severe bleeding occurs. A thoracic aortic aneurysm is an aneurysm that occurs in the first part of the aorta, between the heart and the diaphragm. The aorta is the main artery and supplies blood from the heart to the rest of the body.  A thoracic aortic aneurysm can enlarge and rupture or blood can flow between the layers of the wall of the aorta through a tear (aortic dissection). Both of these conditions can cause bleeding inside the body and can be life threatening unless diagnosed and treated promptly.  CAUSES   The exact cause of a thoracic aortic aneurysm is often unknown. Some contributing factors are:   · A hardening of the arteries caused by the buildup of fat and other substances in the lining of a blood vessel (arteriosclerosis).  · Inflammation of the walls of an artery (arteritis).  · Connective tissue diseases, such as Marfan syndrome.  · Injury or trauma to the aorta.  · An infection, such as syphilis or staphylococcus, in the wall of the aorta (infectious aortitis) caused by bacteria.  RISK FACTORS   Risk factors that contribute to a thoracic aortic aneurysm may include:  · Age older than 60 years.  · High blood pressure (hypertension).  · Male gender.  · Ethnicity (white race).  · Obesity.  · Family history of aneurysm (first degree relatives only).  · Tobacco use.  PREVENTION   The following healthy lifestyle habits may help decrease your risk of a thoracic aortic aneurysm:  · Quitting smoking. Smoking can raise your blood pressure and cause arteriosclerosis.  · Limiting or avoiding alcohol.  · Keeping your blood pressure, blood sugar level, and cholesterol levels within normal limits.  · Decreasing your salt intake. In some people, too much salt can raise blood pressure and increase your risk of abdominal aortic  aneurysm.  · Eating a diet low in saturated fats and cholesterol.  · Increasing your fiber intake by including whole grains, vegetables, and fruits in your diet. Eating these foods may help lower blood pressure.  · Maintaining a healthy weight.  · Staying physically active and exercising regularly.  SYMPTOMS   The symptoms of thoracic aortic aneurysm may vary depending on the size and rate of growth of the aneurysm. Most grow slowly and do not have any symptoms. When symptoms do occur, they may include:  · Pain (chest, back, sides, or abdomen). The pain may vary in intensity. A sudden onset of severe pain may indicate that the aneurysm has ruptured.  · Hoarseness.  · Cough.  · Shortness of breath.  · Swallowing problems.  · Nausea or vomiting or both.  DIAGNOSIS   Since most unruptured thoracic aortic aneurysms have no symptoms, they are often discovered during diagnostic exams for other conditions. An aneurysm may be found during the following procedures:  · Ultrasonography (a one-time screening for thoracic aortic aneurysm by ultrasonography is also recommended for all men aged 65-75 years who have ever smoked).  · X-ray exams.  · A CT scan.  · An MRI.  · Angiography or arteriography.  TREATMENT   Treatment of a thoracic aortic aneurysm depends on the size of your aneurysm, your age, and risk factors for rupture. Medicine to control blood pressure and pain may be used to manage aneurysms smaller than 2.3 in (  6 cm). Regular monitoring for enlargement may be recommended by your health care provider if:  · The aneurysm is 1.2-1.5 in (3-4 cm) in size (an annual ultrasonography may be recommended).  · The aneurysm is 1.5-1.8 in (4-4.5 cm) in size (an ultrasonography every 6 months may be recommended).  · The aneurysm is larger than 1.8 in (4.5 cm) in size (your health care provider may ask that you be examined by a vascular surgeon).  If your aneurysm is larger than 2.2 in (5.5 cm) or if it is enlarging quickly,  surgical repair may be recommended. There are two main methods for repair of an aneurysm:   · Endovascular repair (a minimally invasive surgery).  · Open repair. This method is used if an endovascular repair is not possible.  Document Released: 12/04/2005 Document Revised: 09/24/2013 Document Reviewed: 06/16/2013  [employer]® Patient Information ©2015 [employer]. This information is not intended to replace advice given to you by your health care provider. Make sure you discuss any questions you have with your health care provider.

## 2015-07-06 NOTE — Progress Notes (Signed)
[address]       [place] [address]             [phone]                    Janssen A Treto Hosston Medical Record #[id number] Date of Birth: [birth year]  Referring: Lacretia Leigh, MD Primary Care: Pcp Not In System  Chief Complaint:    Chief Complaint  Patient presents with  . TAA    eval and treat..CTA CHEST per ED MD    History of Present Illness:    Jose [name] 64 y.o. male is seen in the office  today [facility] because of a dilated ascending aorta. The patient has no previous history of cardiac disease, no history of known coronary artery disease or valvular disease.   The patient has a history of intracranial bleed/ subdural hematoma without history of trauma.  He underwent right craniotomy.  Last week he was having increasing cough discussed this with his neurologist who recommended that he be seen and have a CAT scan of the head done. While in the emergency room a CT scan of the chest was also done., Demonstrating mild dilatation of the ascending aorta. The patient is a nonsmoker.     Current Activity/ Functional Status:  Patient is independent with mobility/ambulation, transfers, ADL's, IADL's.   Zubrod Score: At the time of surgery this patient's most appropriate activity status/level should be described as: [x]     0    Normal activity, no symptoms []     1    Restricted in physical strenuous activity but ambulatory, able to do out light work []     2    Ambulatory and capable of self care, unable to do work activities, up and about               >50 % of waking hours                              []     3    Only limited self care, in bed greater than 50% of waking hours []     4    Completely disabled, no self care, confined to bed or chair []     5    Moribund   Past Medical History  Diagnosis Date  . SDH (subdural hematoma)     Past Surgical History  Procedure Laterality Date  . Carpal tunnel  release    . Shoulder surgery      FAmily history: Both of the patient's parents are deceased his father died at age 33 of myocardial infarction previously in his early 83s he had 2 myocardial infarctions. His mother died with Parkinson's at age 46. He has a living brother and sister with history of diabetes Parkinson's and depression, he has one son age 35. He has one cousin who died in his 64s of sudden collapse thought to be a brain aneurysm.   History   Social History  . Marital Status: Married    Spouse Name: N/A  . Number of Children: N/A  . 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 Smoking status: Never Smoker   . Smokeless tobacco: Not on file  . Alcohol Use: No  . Drug  Use: No  . Sexual Activity: Not on file   Other Topics Concern  . Not on file   Social History Narrative    History  Smoking status  . Never Smoker   Smokeless tobacco  . Not on file    History  Alcohol Use No     No Known Allergies  Current Outpatient Prescriptions  Medication Sig Dispense Refill  . allopurinol (ZYLOPRIM) 300 MG tablet Take 300 mg by mouth daily.    . chlorpheniramine-HYDROcodone (TUSSIONEX PENNKINETIC ER) 10-8 MG/5ML SUER Take 5 mLs by mouth every 12 (twelve) hours as needed for cough. 140 mL 0  . colchicine 0.6 MG tablet Take 0.6 mg by mouth as needed (FOR FLAREUPS).    Marland Kitchen levETIRAcetam (KEPPRA) 500 MG tablet Take 500 mg by mouth 2 (two) times daily.    . Multiple Vitamin (MULTIVITAMIN WITH MINERALS) TABS tablet Take 1 tablet by mouth daily.    . tamsulosin (FLOMAX) 0.4 MG CAPS capsule Take 0.4 mg by mouth daily after breakfast.     No current facility-administered medications for this visit.      Review of Systems:     Cardiac Review of Systems: Y or N  Chest Pain [  n  ]  Resting SOB [  n ] Exertional SOB  [n  ]  Orthopnea [ n ]   Pedal Edema [ n  ]    Palpitations [ n  ] Syncope  [ n ]   Presyncope [  n ]  General Review of Systems: [Y] = yes [  ]=no Constitional: recent weight change [  ];  Wt loss over the last 3 months [   ] anorexia [ n ]; fatigue [  ]; nausea [  ]; night sweats [  ]; fever n[  ]; or chills [  ];          Dental: poor dentition[  ]; Last Dentist visit:   Eye : blurred vision [  ]; diplopia [   ]; vision changes [  ];  Amaurosis fugax[  ]; Resp: cough [ y ];  wheezing[ n ];  hemoptysis[ n ]; shortness of breath[n  ]; paroxysmal nocturnal dyspnea[  ]; dyspnea on exertion[  ]; or orthopnea[  ];  GI:  gallstones[  ], vomiting[  ];  dysphagia[  ]; melena[  ];  hematochezia [  ]; heartburn[  ];   Hx of  Colonoscopy[  ]; GU: kidney stones [  ]; hematuria[  ];   dysuria [  ];  nocturia[  ];  history of     obstruction [  ]; urinary frequency [ y ]             Skin: rash, swelling[  ];, hair loss[  ];  peripheral edema[  ];  or itching[  ]; Musculosketetal: myalgias[  ];  joint swelling[  ];  joint erythema[  ];  joint pain[  ];  back pain[  ]; history of gout  Heme/Lymph: bruising[  ];  bleeding[  ];  anemia[  ];  Neuro: TIA[  ];  headaches[  ];  stroke[  ];  vertigo[  ];  seizures[  ];   paresthesias[  ];  difficulty walking[  ];  Psych:depression[ n ]; anxiety[ n ];  Endocrine: diabetes[ n ];  thyroid dysfunction[n  ];  Immunizations: Flu up to date [n  ]; Pneumococcal up to date [ n ];  Other:  Physical Exam: BP 110/70 mmHg  Pulse 78  Resp 16  Ht 6' (1.829 m)  Wt 189 lb (85.73 kg)  BMI 25.63 kg/m2  SpO2 98%  PHYSICAL EXAMINATION: General appearance: alert, cooperative, appears stated age and no distress Head: Normocephalic, without obvious abnormality, atraumatic Neck: no adenopathy, no carotid bruit, no JVD, supple, symmetrical, trachea midline and thyroid not enlarged, symmetric, no tenderness/mass/nodules Lymph nodes: Cervical, supraclavicular, and axillary nodes normal. Resp: clear to auscultation bilaterally Back: symmetric, no  curvature. ROM normal. No CVA tenderness. Cardio: regular rate and rhythm, S1, S2 normal, no murmur, click, rub or gallop GI: soft, non-tender; bowel sounds normal; no masses,  no organomegaly Extremities: extremities normal, atraumatic, no cyanosis or edema and Homans sign is negative, no sign of DVT Neurologic: Grossly normal  Patient has 2+ DP and PT pulses bilaterally Brachial pulses are equal and bilateral radial pulses are equal and bilateral  Diagnostic Studies & Laboratory data:     Recent Radiology Findings:   Dg Chest 2 View  06/26/2015   CLINICAL DATA:  Headache and cough for 3 days  EXAM: CHEST  2 VIEW  COMPARISON:  None.  FINDINGS: The anterior mediastinum is prominent. Normal heart size. Clear lungs. No pneumothorax. No pleural effusion.  IMPRESSION: Prominent anterior mediastinum. Mass is not excluded. CT is recommended.   Electronically Signed   By: Marybelle Killings M.D.   On: 06/26/2015 15:14   Ct Head Wo Contrast  06/26/2015   CLINICAL DATA:  Headache x2 days.  History of subdural hematoma.  EXAM: CT HEAD WITHOUT CONTRAST  TECHNIQUE: Contiguous axial images were obtained from the base of the skull through the vertex without intravenous contrast.  COMPARISON:  None.  FINDINGS: Previous right posterior frontal craniotomy. Scattered dural calcifications deep to the craniotomy flap. Small anterior falcine lipoma. 11 mm partially calcified aneurysm near the junction of the left vertebral and basilar arteries, abuts the brainstem. Mild atrophy. There is no evidence of acute intracranial hemorrhage, brain edema, acute infarction, mass effect, or midline shift. Acute infarct may be inapparent on noncontrast CT. No other intra-axial abnormalities are seen, and the ventricles and sulci are within normal limits in size and symmetry. No abnormal extra-axial fluid collections or other masses are identified. No acute calvarial abnormality.  IMPRESSION: 1. Negative for bleed or other acute intracranial  process. 2. 11 mm aneurysm near the distal left vertebral artery 3. Previous right  craniotomy   Electronically Signed   By: Lucrezia Europe M.D.   On: 06/26/2015 14:30   Ct Angio Chest Aorta W/cm &/or Wo/cm  06/26/2015   CLINICAL DATA:  Headache and cough, prominent anterior mediastinum on chest radiography.  EXAM: CT ANGIOGRAPHY CHEST WITH CONTRAST  TECHNIQUE: Multidetector CT imaging of the chest was performed using the standard protocol during bolus administration of intravenous contrast. Multiplanar CT image reconstructions and MIPs were obtained to evaluate the vascular anatomy.  CONTRAST:  156mL OMNIPAQUE IOHEXOL 350 MG/ML SOLN  COMPARISON:  06/26/2015  FINDINGS: Mediastinum/Nodes: Precontrast images demonstrate calcification of the leaflets of the aortic valve. No evidence of acute intramural hematoma.  Proximal ascending thoracic aortic caliber 4.3 cm. Mid ascending thoracic aortic caliber 4.5 cm. Proximal arch caliber 4.2 cm. Distal arch caliber 2.7 cm. Mid descending thoracic aortic caliber 2.6 cm. Distal descending thoracic aortic caliber 2.4 cm. No aortic dissection identified. The branch vessels patent.  Mild prominence of the upper mediastinal adipose tissue. Mild 4 chamber cardiomegaly. No thoracic adenopathy. No pulmonary embolus is identified.  Lungs/Pleura: Unremarkable  Upper abdomen: Unremarkable  Musculoskeletal: Thoracic spondylosis.  Review of the MIP images confirms the above findings.  IMPRESSION: 1. Ascending aortic aneurysm measures 4.5 cm in diameter. Recommend semi-annual imaging followup by CTA or MRA and referral to cardiothoracic surgery if not already obtained. This recommendation follows 2010 ACCF/AHA/AATS/ACR/ASA/SCA/SCAI/SIR/STS/SVM Guidelines for the Diagnosis and Management of Patients With Thoracic Aortic Disease. Circulation. 2010; 121: Y051-T021 2. Mild 4 chamber cardiomegaly. 3. Thoracic spondylosis. 4. Calcification of the leaflets of the aortic valve.   Electronically Signed    By: Van Clines M.D.   On: 06/26/2015 17:38     I have independently reviewed the above radiology studies  and reviewed the findings with the patient.   Recent Lab Findings: Lab Results  Component Value Date   WBC 7.2 06/26/2015   HGB 15.3 06/26/2015   HCT 43.8 06/26/2015   PLT 184 06/26/2015   GLUCOSE 112* 06/26/2015   CHOL 243* 12/25/2007   TRIG 126 12/25/2007   HDL 66.4 12/25/2007   LDLDIRECT 164.4 12/25/2007   ALT 28 01/25/2009   AST 20 01/25/2009   NA 139 06/26/2015   K 3.7 06/26/2015   CL 105 06/26/2015   CREATININE 1.11 06/26/2015   BUN 12 06/26/2015   CO2 26 06/26/2015    Aortic Size Index=   4.4      /Body surface area is 2.09 meters squared. = 2.2   < 2.75 cm/m2      4% risk per year 2.75 to 4.25          8% risk per year > 4.25 cm/m2    20% risk per year   Assessment / Plan:   Mild dilatation of the ascending aorta without other stigmata of connective tissue disorder or family history of dilated aorta or dissection.- The patient reports while traveling in [place] he was hospitalized and an echocardiogram was recently done and he was told it was "okay". I have reviewed with the patient the diagnosis of dilated ascending aorta. The potential risks of rupture or dissection. The need to seek emergency medical attention with any chest pain. He will attempt to obtain the echocardiogram from [place] for a review, to determine if he has a bicuspid or tricuspid aortic valve.  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 The total time spent in the appointment was 60 minutes.  Grace Isaac MD      [address] [place] [address] Office [phone]   Beeper [phone]  07/06/2015  4:32 PM

## 2015-07-12 DIAGNOSIS — F438 Other reactions to severe stress: Secondary | ICD-10-CM | POA: Diagnosis not present

## 2015-07-15 DIAGNOSIS — R030 Elevated blood-pressure reading, without diagnosis of hypertension: Secondary | ICD-10-CM | POA: Diagnosis not present

## 2015-07-15 DIAGNOSIS — Z6825 Body mass index (BMI) 25.0-25.9, adult: Secondary | ICD-10-CM | POA: Diagnosis not present

## 2015-07-15 DIAGNOSIS — I6200 Nontraumatic subdural hemorrhage, unspecified: Secondary | ICD-10-CM | POA: Diagnosis not present

## 2015-07-16 DIAGNOSIS — F438 Other reactions to severe stress: Secondary | ICD-10-CM | POA: Diagnosis not present

## 2015-07-21 DIAGNOSIS — F438 Other reactions to severe stress: Secondary | ICD-10-CM | POA: Diagnosis not present

## 2015-07-21 DIAGNOSIS — L03818 Cellulitis of other sites: Secondary | ICD-10-CM | POA: Diagnosis not present

## 2015-07-23 DIAGNOSIS — F438 Other reactions to severe stress: Secondary | ICD-10-CM | POA: Diagnosis not present

## 2015-07-26 DIAGNOSIS — F438 Other reactions to severe stress: Secondary | ICD-10-CM | POA: Diagnosis not present

## 2015-07-28 DIAGNOSIS — F438 Other reactions to severe stress: Secondary | ICD-10-CM | POA: Diagnosis not present

## 2015-08-02 DIAGNOSIS — F438 Other reactions to severe stress: Secondary | ICD-10-CM | POA: Diagnosis not present

## 2015-08-03 DIAGNOSIS — N401 Enlarged prostate with lower urinary tract symptoms: Secondary | ICD-10-CM | POA: Diagnosis not present

## 2015-08-03 DIAGNOSIS — R972 Elevated prostate specific antigen [PSA]: Secondary | ICD-10-CM | POA: Diagnosis not present

## 2015-08-05 DIAGNOSIS — F438 Other reactions to severe stress: Secondary | ICD-10-CM | POA: Diagnosis not present

## 2015-08-11 DIAGNOSIS — F438 Other reactions to severe stress: Secondary | ICD-10-CM | POA: Diagnosis not present

## 2015-08-26 DIAGNOSIS — F438 Other reactions to severe stress: Secondary | ICD-10-CM | POA: Diagnosis not present

## 2015-08-30 DIAGNOSIS — F438 Other reactions to severe stress: Secondary | ICD-10-CM | POA: Diagnosis not present

## 2015-09-01 DIAGNOSIS — F438 Other reactions to severe stress: Secondary | ICD-10-CM | POA: Diagnosis not present

## 2015-09-06 DIAGNOSIS — F438 Other reactions to severe stress: Secondary | ICD-10-CM | POA: Diagnosis not present

## 2015-09-08 DIAGNOSIS — F438 Other reactions to severe stress: Secondary | ICD-10-CM | POA: Diagnosis not present

## 2015-09-16 DIAGNOSIS — Z79899 Other long term (current) drug therapy: Secondary | ICD-10-CM | POA: Diagnosis not present

## 2015-09-17 DIAGNOSIS — M170 Bilateral primary osteoarthritis of knee: Secondary | ICD-10-CM | POA: Diagnosis not present

## 2015-09-17 DIAGNOSIS — M25571 Pain in right ankle and joints of right foot: Secondary | ICD-10-CM | POA: Diagnosis not present

## 2015-09-17 DIAGNOSIS — M1A00X0 Idiopathic chronic gout, unspecified site, without tophus (tophi): Secondary | ICD-10-CM | POA: Diagnosis not present

## 2015-09-20 DIAGNOSIS — F438 Other reactions to severe stress: Secondary | ICD-10-CM | POA: Diagnosis not present

## 2015-09-22 DIAGNOSIS — F438 Other reactions to severe stress: Secondary | ICD-10-CM | POA: Diagnosis not present

## 2015-09-23 DIAGNOSIS — M170 Bilateral primary osteoarthritis of knee: Secondary | ICD-10-CM | POA: Diagnosis not present

## 2015-09-23 DIAGNOSIS — M1A00X0 Idiopathic chronic gout, unspecified site, without tophus (tophi): Secondary | ICD-10-CM | POA: Diagnosis not present

## 2015-09-23 DIAGNOSIS — M7072 Other bursitis of hip, left hip: Secondary | ICD-10-CM | POA: Diagnosis not present

## 2015-09-27 DIAGNOSIS — Z23 Encounter for immunization: Secondary | ICD-10-CM | POA: Diagnosis not present

## 2015-09-27 DIAGNOSIS — F438 Other reactions to severe stress: Secondary | ICD-10-CM | POA: Diagnosis not present

## 2015-09-30 DIAGNOSIS — F438 Other reactions to severe stress: Secondary | ICD-10-CM | POA: Diagnosis not present

## 2015-10-04 DIAGNOSIS — F438 Other reactions to severe stress: Secondary | ICD-10-CM | POA: Diagnosis not present

## 2015-10-13 DIAGNOSIS — F438 Other reactions to severe stress: Secondary | ICD-10-CM | POA: Diagnosis not present

## 2015-10-15 DIAGNOSIS — F438 Other reactions to severe stress: Secondary | ICD-10-CM | POA: Diagnosis not present

## 2015-10-18 DIAGNOSIS — F438 Other reactions to severe stress: Secondary | ICD-10-CM | POA: Diagnosis not present

## 2015-10-20 DIAGNOSIS — F438 Other reactions to severe stress: Secondary | ICD-10-CM | POA: Diagnosis not present

## 2015-10-24 DIAGNOSIS — M109 Gout, unspecified: Secondary | ICD-10-CM | POA: Diagnosis not present

## 2015-10-26 DIAGNOSIS — F438 Other reactions to severe stress: Secondary | ICD-10-CM | POA: Diagnosis not present

## 2015-10-28 DIAGNOSIS — F438 Other reactions to severe stress: Secondary | ICD-10-CM | POA: Diagnosis not present

## 2015-11-03 DIAGNOSIS — F438 Other reactions to severe stress: Secondary | ICD-10-CM | POA: Diagnosis not present

## 2015-11-07 DIAGNOSIS — J209 Acute bronchitis, unspecified: Secondary | ICD-10-CM | POA: Diagnosis not present

## 2015-11-26 DIAGNOSIS — F438 Other reactions to severe stress: Secondary | ICD-10-CM | POA: Diagnosis not present

## 2015-11-26 DIAGNOSIS — M170 Bilateral primary osteoarthritis of knee: Secondary | ICD-10-CM | POA: Diagnosis not present

## 2015-12-03 DIAGNOSIS — M170 Bilateral primary osteoarthritis of knee: Secondary | ICD-10-CM | POA: Diagnosis not present

## 2015-12-03 DIAGNOSIS — F438 Other reactions to severe stress: Secondary | ICD-10-CM | POA: Diagnosis not present

## 2015-12-04 DIAGNOSIS — M25562 Pain in left knee: Secondary | ICD-10-CM | POA: Diagnosis not present

## 2015-12-07 DIAGNOSIS — I6200 Nontraumatic subdural hemorrhage, unspecified: Secondary | ICD-10-CM | POA: Diagnosis not present

## 2015-12-07 DIAGNOSIS — I671 Cerebral aneurysm, nonruptured: Secondary | ICD-10-CM | POA: Diagnosis not present

## 2015-12-08 DIAGNOSIS — F438 Other reactions to severe stress: Secondary | ICD-10-CM | POA: Diagnosis not present

## 2015-12-10 DIAGNOSIS — M170 Bilateral primary osteoarthritis of knee: Secondary | ICD-10-CM | POA: Diagnosis not present

## 2015-12-14 DIAGNOSIS — F438 Other reactions to severe stress: Secondary | ICD-10-CM | POA: Diagnosis not present

## 2015-12-15 ENCOUNTER — Other Ambulatory Visit: Payer: Self-pay | Admitting: Cardiothoracic Surgery

## 2015-12-15 DIAGNOSIS — I712 Thoracic aortic aneurysm, without rupture, unspecified: Secondary | ICD-10-CM

## 2015-12-16 ENCOUNTER — Other Ambulatory Visit: Payer: Self-pay | Admitting: Cardiothoracic Surgery

## 2015-12-16 DIAGNOSIS — I712 Thoracic aortic aneurysm, without rupture, unspecified: Secondary | ICD-10-CM

## 2015-12-16 DIAGNOSIS — F438 Other reactions to severe stress: Secondary | ICD-10-CM | POA: Diagnosis not present

## 2015-12-27 DIAGNOSIS — F438 Other reactions to severe stress: Secondary | ICD-10-CM | POA: Diagnosis not present

## 2015-12-31 DIAGNOSIS — F438 Other reactions to severe stress: Secondary | ICD-10-CM | POA: Diagnosis not present

## 2016-01-05 DIAGNOSIS — F438 Other reactions to severe stress: Secondary | ICD-10-CM | POA: Diagnosis not present

## 2016-01-06 ENCOUNTER — Other Ambulatory Visit: Payer: Self-pay | Admitting: *Deleted

## 2016-01-06 ENCOUNTER — Encounter: Payer: Self-pay | Admitting: Cardiothoracic Surgery

## 2016-01-06 ENCOUNTER — Ambulatory Visit (INDEPENDENT_AMBULATORY_CARE_PROVIDER_SITE_OTHER): Payer: Medicare Other | Admitting: Cardiothoracic Surgery

## 2016-01-06 ENCOUNTER — Ambulatory Visit
Admission: RE | Admit: 2016-01-06 | Discharge: 2016-01-06 | Disposition: A | Discharge status: Home / Self Care | Payer: Medicare Other | Source: Ambulatory Visit | Attending: Cardiothoracic Surgery | Admitting: Cardiothoracic Surgery

## 2016-01-06 VITALS — BP 128/97 | HR 85 | Resp 16 | Ht 72.0 in | Wt 194.0 lb

## 2016-01-06 DIAGNOSIS — I712 Thoracic aortic aneurysm, without rupture, unspecified: Secondary | ICD-10-CM

## 2016-01-06 DIAGNOSIS — I77810 Thoracic aortic ectasia: Secondary | ICD-10-CM

## 2016-01-06 DIAGNOSIS — I351 Nonrheumatic aortic (valve) insufficiency: Secondary | ICD-10-CM

## 2016-01-06 LAB — CREATININE, ISTAT: Creatinine, IStat: 1.1 mg/dL (ref 0.6–1.3)

## 2016-01-06 MED ORDER — IOPAMIDOL (ISOVUE-370) INJECTION 76%
75.0000 mL | Freq: Once | INTRAVENOUS | Status: AC | PRN
  Administered 2016-01-06: 75 mL via INTRAVENOUS

## 2016-01-06 NOTE — Progress Notes (Signed)
[address]       [place] [address]             [phone]                    Hendricks A Sharber Kirby Medical Record [id number] Date of Birth: [birth year]  Referring: Lacretia Leigh, MD Primary Care: [facility]  Chief Complaint:    Chief Complaint  Patient presents with  . Thoracic Aortic Aneurysm    6 month f/u with CTA Chest    History of Present Illness:    Jose [name] 65 y.o. male was first seen in the office 07/06/2015  referred by the emergency room at [facility]  because of a dilated ascending aorta. The patient has no previous history of cardiac disease, no history of known coronary artery disease or valvular disease.  The patient has a history of intracranial bleed/ subdural hematoma without history of trauma treated in [place].   He underwent right craniotomy.  In July 2016 he was having increasing cough discussed this with his neurologist who recommended that he be seen and have a CAT scan of the head done. While in the emergency room a CT scan of the chest was also done., Demonstrating mild dilatation of the ascending aorta. The patient is a nonsmoker.  He denies any symptoms of angina, has not had an echocardiogram done recently.   History family history is significant for his father who died in his late 75s with myocardial infarction, after having his first myocardial infarction in his 6s. There is no family history of aortic aneurysms, unexplained sudden death at an early age or aortic dissection.He has one cousin who died in his 35s of sudden collapse thought to be a brain aneurysm  Current Activity/ Functional Status:  Patient is independent with mobility/ambulation, transfers, ADL's, IADL's.   Zubrod Score: At the time of surgery this patient's most appropriate activity status/level should be described as: [x]     0    Normal activity, no symptoms []     1    Restricted in physical strenuous activity but ambulatory, able  to do out light work []     2    Ambulatory and capable of self care, unable to do work activities, up and about               >50 % of waking hours                              []     3    Only limited self care, in bed greater than 50% of waking hours []     4    Completely disabled, no self care, confined to bed or chair []     5    Moribund   Past Medical History  Diagnosis Date  . SDH (subdural hematoma) [facility])     Past Surgical History  Procedure Laterality Date  . Carpal tunnel release    . Shoulder surgery      FAmily history: Both of the patient's parents are deceased his father died at age 44 of myocardial infarction previously in his early 40s he had 2 myocardial infarctions. His mother died with Parkinson's at age 26. He has a living brother and sister with history of diabetes Parkinson's and depression, he has one son age 25. He has one cousin who died in his  16s of sudden collapse thought to be a brain aneurysm.   History   Social History  . Marital Status: Married    Spouse Name: N/A  . Number of Children: N/A  . 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 Smoking status: Never Smoker   . Smokeless tobacco: Not on file  . Alcohol Use: No  . Drug Use: No  . Sexual Activity: Not on file   Other Topics Concern  . Not on file   Social History Narrative    History  Smoking status  . Never Smoker   Smokeless tobacco  . Not on file    History  Alcohol Use No     No Known Allergies  Current Outpatient Prescriptions  Medication Sig Dispense Refill  . allopurinol (ZYLOPRIM) 300 MG tablet Take 300 mg by mouth daily.    . colchicine 0.6 MG tablet Take 0.6 mg by mouth as needed (FOR FLAREUPS).    . Multiple Vitamin (MULTIVITAMIN WITH MINERALS) TABS tablet Take 1 tablet by mouth daily.    . tamsulosin (FLOMAX) 0.4 MG CAPS capsule Take 0.4 mg by mouth daily after  breakfast.     No current facility-administered medications for this visit.      Review of Systems:     Cardiac Review of Systems: Y or N  Chest Pain [  n  ]  Resting SOB [  n ] Exertional SOB  [n  ]  Orthopnea [ n ]   Pedal Edema [ n  ]    Palpitations [ n ] Syncope  [ n ]   Presyncope [  n ]  General Review of Systems: [Y] = yes [  ]=no Constitional: recent weight change [  ];  Wt loss over the last 3 months [   ] anorexia [ n ]; fatigue [  ]; nausea [  ]; night sweats [  ]; fever n[  ]; or chills [  ];          Dental: poor dentition[  ]; Last Dentist visit:   Eye : blurred vision [  ]; diplopia [   ]; vision changes [  ];  Amaurosis fugax[  ]; Resp: cough [ y ];  wheezing[ n ];  hemoptysis[ n ]; shortness of breath[n  ]; paroxysmal nocturnal dyspnea[  ]; dyspnea on exertion[  ]; or orthopnea[  ];  GI:  gallstones[  ], vomiting[  ];  dysphagia[  ]; melena[  ];  hematochezia [  ]; heartburn[  ];   Hx of  Colonoscopy[  ]; GU: kidney stones [  ]; hematuria[  ];   dysuria [  ];  nocturia[  ];  history of     obstruction [  ]; urinary frequency [ y ]             Skin: rash, swelling[  ];, hair loss[  ];  peripheral edema[  ];  or itching[  ]; Musculosketetal: myalgias[  ];  joint swelling[  ];  joint erythema[  ];  joint pain[  ];  back pain[  ]; history of gout  Heme/Lymph: bruising[  ];  bleeding[  ];  anemia[  ];  Neuro: TIA[  ];  headaches[  ];  stroke[  ];  vertigo[  ];  seizures[  ];   paresthesias[  ];  difficulty walking[  ];  Psych:depression[ n ];  anxiety[ n ];  Endocrine: diabetes[ n ];  thyroid dysfunction[n  ];  Immunizations: Flu up to date [n  ]; Pneumococcal up to date [ n ];  Other:  Physical Exam: BP 128/97 mmHg  Pulse 85  Resp 16  Ht 6' (1.829 m)  Wt 194 lb (87.998 kg)  BMI 26.31 kg/m2  SpO2 98%  PHYSICAL EXAMINATION: General appearance: alert, cooperative, appears stated age and no distress Head: Normocephalic, without obvious abnormality, atraumatic Neck: no  adenopathy, no carotid bruit, no JVD, supple, symmetrical, trachea midline and thyroid not enlarged, symmetric, no tenderness/mass/nodules Lymph nodes: Cervical, supraclavicular, and axillary nodes normal. Resp: clear to auscultation bilaterally Back: symmetric, no curvature. ROM normal. No CVA tenderness. Cardio: regular rate and rhythm, S1, S2 normal, no murmur, click, rub or gallop GI: soft, non-tender; bowel sounds normal; no masses,  no organomegaly Extremities: extremities normal, atraumatic, no cyanosis or edema and Homans sign is negative, no sign of DVT Neurologic: Grossly normal  Patient has 2+ DP and PT pulses bilaterally Brachial pulses are equal and bilateral radial pulses are equal and bilateral  Diagnostic Studies & Laboratory data:     Recent Radiology Findings:   Ct Angio Chest Aorta W/cm &/or Wo/cm  01/06/2016  CLINICAL DATA:  Follow-up aneurysm.  Cough.  Nonsmoker. EXAM: CT ANGIOGRAPHY CHEST WITH CONTRAST TECHNIQUE: Multidetector CT imaging of the chest was performed using the standard protocol during bolus administration of intravenous contrast. Multiplanar CT image reconstructions and MIPs were obtained to evaluate the vascular anatomy. CONTRAST:  75 mL Isovue 370 COMPARISON:  06/26/2015 FINDINGS: Mediastinum/Lymph Nodes: No thoracic aortic dissection identified. Ascending thoracic aorta measures 4.5 cm at the level of the right main pulmonary artery. No pericardial effusion. Normal heart size. No masses or pathologically enlarged lymph nodes identified. Lungs/Pleura: No pulmonary mass, infiltrate, or effusion. Upper abdomen: No acute findings. Musculoskeletal: No chest wall mass or suspicious bone lesions identified. Review of the MIP images confirms the above findings. IMPRESSION: 1. Stable ascending thoracic aortic aneurysm measuring 4.5 cm. Ascending thoracic aortic aneurysm. Recommend semi-annual imaging followup by CTA or MRA and referral to cardiothoracic surgery if not  already obtained. This recommendation follows 2010 ACCF/AHA/AATS/ACR/ASA/SCA/SCAI/SIR/STS/SVM Guidelines for the Diagnosis and Management of Patients With Thoracic Aortic Disease. Circulation. 2010; 121: [id number] Electronically Signed   By: Kathreen Devoid   On: 01/06/2016 10:43     I have independently reviewed the above radiology studies  and reviewed the findings with the patient.   Recent Lab Findings: Lab Results  Component Value Date   WBC 7.2 06/26/2015   HGB 15.3 06/26/2015   HCT 43.8 06/26/2015   PLT 184 06/26/2015   GLUCOSE 112* 06/26/2015   CHOL 243* 12/25/2007   TRIG 126 12/25/2007   HDL 66.4 12/25/2007   LDLDIRECT 164.4 12/25/2007   ALT 28 01/25/2009   AST 20 01/25/2009   NA 139 06/26/2015   K 3.7 06/26/2015   CL 105 06/26/2015   CREATININE 1.11 06/26/2015   BUN 12 06/26/2015   CO2 26 06/26/2015    Aortic Size Index=   4.4      /Body surface area is 2.11 meters squared. = 2.2   < 2.75 cm/m2      4% risk per year 2.75 to 4.25          8% risk per year > 4.25 cm/m2    20% risk per year   Assessment / Plan:   Mild dilatation of the ascending aorta without other stigmata of connective  tissue disorder or family history of dilated aorta or dissection.- The patient reports while traveling in [place] he was hospitalized and an echocardiogram was recently done and he was told it was "okay". I have reviewed with the patient the diagnosis of dilated ascending aorta. The potential risks of rupture or dissection. The need to seek emergency medical attention with any chest pain.  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 I've cautioned the patient about competitive weightlifting, and also the need for good blood pressure control According to the 2010 ACC/AHA guidelines, we recommend patients with thoracic aortic disease to maintain a LDL of less than 70 and a HDL of greater than 50. We recommend their blood pressure to remain less than 135/85.  We'll plan on seeing him back with a follow-up CTA of the chest in 12 months,       Grace Isaac MD      [address] [place] [address] Office [phone]   Beeper [phone]  01/06/2016 12:07 PM

## 2016-01-07 DIAGNOSIS — F438 Other reactions to severe stress: Secondary | ICD-10-CM | POA: Diagnosis not present

## 2016-01-14 DIAGNOSIS — F438 Other reactions to severe stress: Secondary | ICD-10-CM | POA: Diagnosis not present

## 2016-01-18 ENCOUNTER — Other Ambulatory Visit (HOSPITAL_COMMUNITY): Payer: Medicare Other

## 2016-01-28 ENCOUNTER — Ambulatory Visit: Payer: Medicare Other | Admitting: Cardiology

## 2016-01-31 DIAGNOSIS — F438 Other reactions to severe stress: Secondary | ICD-10-CM | POA: Diagnosis not present

## 2016-02-01 DIAGNOSIS — R972 Elevated prostate specific antigen [PSA]: Secondary | ICD-10-CM | POA: Diagnosis not present

## 2016-02-01 DIAGNOSIS — N401 Enlarged prostate with lower urinary tract symptoms: Secondary | ICD-10-CM | POA: Diagnosis not present

## 2016-02-02 NOTE — Progress Notes (Signed)
Patient ID: RABUN LOMELI, male   DOB: [birth year], 65 y.o.   MRN: [id number]     Cardiology Office Note   Date:  02/07/2016   ID:  Jose [name], DOB [birth year], MRN [id number]  PCP:  [facility]  Cardiologist:   [name], MD   Chief Complaint  Patient presents with  . Establish Care    hx of cad in family      History of Present Illness: Jose [name] is a 65 y.o. male who presents for evaluation of strong family history of CAD  Followed by [name] for ascending aortic dilatations.  Reviewed CTA 12/28/15 and measured stable 4.5 cm  The patient has a history of intracranial bleed/ subdural hematoma without history of trauma treated in [place]. He underwent right craniotomy. 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 There is no family history of aortic aneurysms, unexplained sudden death at an early age or aortic dissection.He has one cousin who died in his 106s of sudden collapse thought to be a brain aneurysm  I reviewed his echo from today and he has a bicuspid aortic valve  Mean gradient 17 mmhg and peak 31 mmHg.  Mild to moderate AS  Aortic root mildly dilated  Long discussion with him about diagnosis and how we follow it. He has mild exertional dsypnea no chest pain.  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 Systolic function was normal. The estimated ejection fraction was in the range of 55% to 60%. - Aortic valve: Bicuspid  aortic valve Bicuspid. There was mild stenosis. Valve area (VTI): 1.32 cm^2. Valve area (Vmax): 1.23 cm^2. Valve area (Vmean): 1.15 cm^2. - Atrial septum: No defect or patent foramen ovale was identified.   Past Medical History  Diagnosis Date  . SDH (subdural hematoma) [facility])     Past Surgical History  Procedure Laterality Date  . Carpal tunnel release    . Shoulder surgery       Current Outpatient Prescriptions  Medication Sig Dispense Refill  . allopurinol (ZYLOPRIM) 300 MG tablet Take 300 mg by mouth daily.    . colchicine 0.6 MG tablet Take 0.6 mg by mouth daily as needed (FOR GOUT FLAREUPS).     . Multiple Vitamin (MULTIVITAMIN WITH MINERALS) TABS tablet Take 1 tablet by mouth daily.    . tamsulosin (FLOMAX) 0.4 MG CAPS capsule Take 0.4 mg by mouth daily after breakfast.     No current facility-administered medications for this visit.    Allergies:   Review of patient's allergies indicates no known allergies.    Social History:  The patient  reports that he has never smoked. He does not have any smokeless tobacco history on file. He reports that he does not drink alcohol or use illicit drugs.   Family History:  The patient's family history is not on file.    ROS:  Please see the history of present  illness.   Otherwise, review of systems are positive for none.   All other systems are reviewed and negative.    PHYSICAL EXAM: VS:  BP 116/90 mmHg  Pulse 83  Ht 6' (1.829 m)  Wt 90.719 kg (200 lb)  BMI 27.12 kg/m2  SpO2 99% , BMI Body mass index is 27.12 kg/(m^2). Affect appropriate Healthy:  appears stated age 2: normal Neck supple with no adenopathy JVP normal no bruits no thyromegaly Lungs clear with no wheezing and good diaphragmatic motion Heart:  S1/S2 spplit SEM murmur, no rub, gallop or click PMI normal Abdomen: benighn, BS positve, no tenderness, no AAA no bruit.  No HSM or HJR Distal pulses intact with no bruits No edema Neuro non-focal Skin  warm and dry No muscular weakness    EKG:  06/27/15  SR rate 52  Low voltage septal infarct ? Lead placement  02/07/16  SR rate 87  Nonspecific ST changes    Recent Labs: 06/26/2015: BUN 12; Creatinine, Ser 1.11; Hemoglobin 15.3; Platelets 184; Potassium 3.7; Sodium 139    Lipid Panel    Component Value Date/Time   CHOL 243* 12/25/2007 1636   TRIG 126 12/25/2007 1636   HDL 66.4 12/25/2007 1636   CHOLHDL 3.7 CALC 12/25/2007 1636   VLDL 25 12/25/2007 1636   LDLDIRECT 164.4 12/25/2007 1636      Wt Readings from Last 3 Encounters:  02/07/16 90.719 kg (200 lb)  01/06/16 87.998 kg (194 lb)  07/06/15 85.73 kg (189 lb)      Other studies Reviewed: Additional studies/ records that were reviewed today include:  Epic notes CTA, ECG Notes from [name] see HPI.    ASSESSMENT AND PLAN:  1.  Aortic Aneurysm:  Now in context of bicuspid valve will need to be followed closer per [name] 4.5 cm 2. Subdural:  Spontaneous avoid antiplatelet agents follow vertebral artery with ? Duplex per neuro 3. Family history CAD.  Given age and family history with abnormal ECG will order exercise myovue r/o CAD 4. AAA:  Given age and aneurysms elsewhere will order abdominal US r/o AAA 5. Bicuspid AV:  Long discussion about prognosis and indications for surgery and relation to his previously diagnosed aneurysm.   Mild to moderate AS  F/u echo in 6 months   Depending on HR/BP response to exercise will likely start beta blocker to decrease rate of change in aneurysm     Current medicines are reviewed at length with the patient today.  The patient does not have concerns regarding medicines.  The following changes have been made:  no change  Labs/ tests ordered today include:  Ex Myovue Abd [place]     Orders Placed This Encounter  Procedures  . Myocardial Perfusion Imaging  . EKG 12-Lead     Disposition:   FU with 6 months with echo   Discussed issues and new diagnosis with [name]       Signed, [name], MD  02/07/2016 4:16 PM    [facility] [place], [place], [place]  [address] Phone: [phone]; Fax: [phone]

## 2016-02-07 ENCOUNTER — Ambulatory Visit (HOSPITAL_COMMUNITY): Payer: Medicare Other | Attending: Cardiovascular Disease

## 2016-02-07 ENCOUNTER — Encounter: Payer: Self-pay | Admitting: Cardiovascular Disease

## 2016-02-07 ENCOUNTER — Ambulatory Visit (INDEPENDENT_AMBULATORY_CARE_PROVIDER_SITE_OTHER): Payer: Medicare Other | Admitting: Cardiovascular Disease

## 2016-02-07 ENCOUNTER — Other Ambulatory Visit: Payer: Self-pay

## 2016-02-07 VITALS — BP 116/90 | HR 83 | Ht 72.0 in | Wt 200.0 lb

## 2016-02-07 DIAGNOSIS — I77810 Thoracic aortic ectasia: Secondary | ICD-10-CM | POA: Diagnosis not present

## 2016-02-07 DIAGNOSIS — Z7189 Other specified counseling: Secondary | ICD-10-CM | POA: Diagnosis not present

## 2016-02-07 DIAGNOSIS — I351 Nonrheumatic aortic (valve) insufficiency: Secondary | ICD-10-CM | POA: Insufficient documentation

## 2016-02-07 DIAGNOSIS — I350 Nonrheumatic aortic (valve) stenosis: Secondary | ICD-10-CM | POA: Diagnosis not present

## 2016-02-07 DIAGNOSIS — Z7689 Persons encountering health services in other specified circumstances: Secondary | ICD-10-CM

## 2016-02-07 DIAGNOSIS — Q231 Congenital insufficiency of aortic valve: Secondary | ICD-10-CM | POA: Diagnosis not present

## 2016-02-07 DIAGNOSIS — R9431 Abnormal electrocardiogram [ECG] [EKG]: Secondary | ICD-10-CM | POA: Diagnosis not present

## 2016-02-07 DIAGNOSIS — F438 Other reactions to severe stress: Secondary | ICD-10-CM | POA: Diagnosis not present

## 2016-02-07 NOTE — Patient Instructions (Signed)
Medication Instructions:  Your physician recommends that you continue on your current medications as directed. Please refer to the Current Medication list given to you today.  Labwork: NONE  Testing/Procedures: Your physician has requested that you have en exercise stress myoview. For further information please visit [email or web address]. Please follow instruction sheet, as given.  Your physician has requested that you have an abdominal aorta duplex. During this test, an ultrasound is used to evaluate the aorta. Allow 30 minutes for this exam. Do not eat after midnight the day before and avoid carbonated beverages  Follow-Up: Your physician wants you to follow-up in: 6 months with Dr. Johnsie Cancel. You will receive a reminder letter in the mail two months in advance. If you don't receive a letter, please call our office to schedule the follow-up appointment.  If you need a refill on your cardiac medications before your next appointment, please call your pharmacy.

## 2016-02-11 DIAGNOSIS — R972 Elevated prostate specific antigen [PSA]: Secondary | ICD-10-CM | POA: Diagnosis not present

## 2016-02-11 DIAGNOSIS — F438 Other reactions to severe stress: Secondary | ICD-10-CM | POA: Diagnosis not present

## 2016-02-15 ENCOUNTER — Other Ambulatory Visit: Payer: Self-pay | Admitting: Cardiovascular Disease

## 2016-02-15 DIAGNOSIS — I714 Abdominal aortic aneurysm, without rupture: Secondary | ICD-10-CM

## 2016-02-15 DIAGNOSIS — I700 Atherosclerosis of aorta: Secondary | ICD-10-CM

## 2016-02-15 DIAGNOSIS — I7161 Supraceliac aneurysm of the abdominal aorta, without rupture: Secondary | ICD-10-CM

## 2016-02-16 DIAGNOSIS — F438 Other reactions to severe stress: Secondary | ICD-10-CM | POA: Diagnosis not present

## 2016-02-22 ENCOUNTER — Telehealth (HOSPITAL_COMMUNITY): Payer: Self-pay

## 2016-02-22 NOTE — Telephone Encounter (Signed)
Encounter complete. 

## 2016-02-23 ENCOUNTER — Telehealth (HOSPITAL_COMMUNITY): Payer: Self-pay

## 2016-02-23 NOTE — Telephone Encounter (Signed)
Encounter complete. 

## 2016-02-24 ENCOUNTER — Encounter (HOSPITAL_COMMUNITY): Payer: Self-pay | Admitting: Cardiovascular Disease

## 2016-02-24 ENCOUNTER — Inpatient Hospital Stay (HOSPITAL_COMMUNITY): Admission: RE | Admit: 2016-02-24 | Payer: Medicare Other | Source: Ambulatory Visit

## 2016-02-29 DIAGNOSIS — F438 Other reactions to severe stress: Secondary | ICD-10-CM | POA: Diagnosis not present

## 2016-03-01 DIAGNOSIS — M1A00X0 Idiopathic chronic gout, unspecified site, without tophus (tophi): Secondary | ICD-10-CM | POA: Diagnosis not present

## 2016-03-01 DIAGNOSIS — M25511 Pain in right shoulder: Secondary | ICD-10-CM | POA: Diagnosis not present

## 2016-03-01 DIAGNOSIS — M19241 Secondary osteoarthritis, right hand: Secondary | ICD-10-CM | POA: Diagnosis not present

## 2016-03-01 DIAGNOSIS — M79641 Pain in right hand: Secondary | ICD-10-CM | POA: Diagnosis not present

## 2016-03-02 DIAGNOSIS — F438 Other reactions to severe stress: Secondary | ICD-10-CM | POA: Diagnosis not present

## 2016-03-06 ENCOUNTER — Encounter (HOSPITAL_COMMUNITY): Payer: Self-pay | Admitting: Cardiovascular Disease

## 2016-03-06 DIAGNOSIS — F438 Other reactions to severe stress: Secondary | ICD-10-CM | POA: Diagnosis not present

## 2016-03-07 DIAGNOSIS — Z79899 Other long term (current) drug therapy: Secondary | ICD-10-CM | POA: Diagnosis not present

## 2016-03-07 DIAGNOSIS — R5381 Other malaise: Secondary | ICD-10-CM | POA: Diagnosis not present

## 2016-03-07 DIAGNOSIS — R52 Pain, unspecified: Secondary | ICD-10-CM | POA: Diagnosis not present

## 2016-03-16 DIAGNOSIS — R972 Elevated prostate specific antigen [PSA]: Secondary | ICD-10-CM | POA: Diagnosis not present

## 2016-03-27 DIAGNOSIS — F438 Other reactions to severe stress: Secondary | ICD-10-CM | POA: Diagnosis not present

## 2016-04-24 DIAGNOSIS — M1A00X0 Idiopathic chronic gout, unspecified site, without tophus (tophi): Secondary | ICD-10-CM | POA: Diagnosis not present

## 2016-04-24 DIAGNOSIS — M79661 Pain in right lower leg: Secondary | ICD-10-CM | POA: Diagnosis not present

## 2016-04-24 DIAGNOSIS — M25551 Pain in right hip: Secondary | ICD-10-CM | POA: Diagnosis not present

## 2016-04-24 DIAGNOSIS — M167 Other unilateral secondary osteoarthritis of hip: Secondary | ICD-10-CM | POA: Diagnosis not present

## 2016-05-01 DIAGNOSIS — F438 Other reactions to severe stress: Secondary | ICD-10-CM | POA: Diagnosis not present

## 2016-05-08 DIAGNOSIS — F438 Other reactions to severe stress: Secondary | ICD-10-CM | POA: Diagnosis not present

## 2016-05-12 DIAGNOSIS — F438 Other reactions to severe stress: Secondary | ICD-10-CM | POA: Diagnosis not present

## 2016-05-29 DIAGNOSIS — F438 Other reactions to severe stress: Secondary | ICD-10-CM | POA: Diagnosis not present

## 2016-06-09 DIAGNOSIS — F438 Other reactions to severe stress: Secondary | ICD-10-CM | POA: Diagnosis not present

## 2016-06-14 DIAGNOSIS — Z6827 Body mass index (BMI) 27.0-27.9, adult: Secondary | ICD-10-CM | POA: Diagnosis not present

## 2016-06-14 DIAGNOSIS — J029 Acute pharyngitis, unspecified: Secondary | ICD-10-CM | POA: Diagnosis not present

## 2016-06-18 DIAGNOSIS — J209 Acute bronchitis, unspecified: Secondary | ICD-10-CM | POA: Diagnosis not present

## 2016-06-19 DIAGNOSIS — F438 Other reactions to severe stress: Secondary | ICD-10-CM | POA: Diagnosis not present

## 2016-06-28 DIAGNOSIS — F438 Other reactions to severe stress: Secondary | ICD-10-CM | POA: Diagnosis not present

## 2016-06-30 DIAGNOSIS — F438 Other reactions to severe stress: Secondary | ICD-10-CM | POA: Diagnosis not present

## 2016-07-07 DIAGNOSIS — R05 Cough: Secondary | ICD-10-CM | POA: Diagnosis not present

## 2016-07-07 DIAGNOSIS — J209 Acute bronchitis, unspecified: Secondary | ICD-10-CM | POA: Diagnosis not present

## 2016-07-07 DIAGNOSIS — R011 Cardiac murmur, unspecified: Secondary | ICD-10-CM | POA: Diagnosis not present

## 2016-07-10 DIAGNOSIS — F438 Other reactions to severe stress: Secondary | ICD-10-CM | POA: Diagnosis not present

## 2016-07-14 DIAGNOSIS — F438 Other reactions to severe stress: Secondary | ICD-10-CM | POA: Diagnosis not present

## 2016-07-24 DIAGNOSIS — F438 Other reactions to severe stress: Secondary | ICD-10-CM | POA: Diagnosis not present

## 2016-07-30 DIAGNOSIS — H1089 Other conjunctivitis: Secondary | ICD-10-CM | POA: Diagnosis not present

## 2016-07-31 DIAGNOSIS — R300 Dysuria: Secondary | ICD-10-CM | POA: Diagnosis not present

## 2016-07-31 DIAGNOSIS — F438 Other reactions to severe stress: Secondary | ICD-10-CM | POA: Diagnosis not present

## 2016-08-11 DIAGNOSIS — F438 Other reactions to severe stress: Secondary | ICD-10-CM | POA: Diagnosis not present

## 2016-08-22 DIAGNOSIS — F438 Other reactions to severe stress: Secondary | ICD-10-CM | POA: Diagnosis not present

## 2016-08-28 DIAGNOSIS — N401 Enlarged prostate with lower urinary tract symptoms: Secondary | ICD-10-CM | POA: Diagnosis not present

## 2016-08-28 DIAGNOSIS — R972 Elevated prostate specific antigen [PSA]: Secondary | ICD-10-CM | POA: Diagnosis not present

## 2016-08-28 DIAGNOSIS — J069 Acute upper respiratory infection, unspecified: Secondary | ICD-10-CM | POA: Diagnosis not present

## 2016-09-22 DIAGNOSIS — F438 Other reactions to severe stress: Secondary | ICD-10-CM | POA: Diagnosis not present

## 2016-09-25 DIAGNOSIS — F438 Other reactions to severe stress: Secondary | ICD-10-CM | POA: Diagnosis not present

## 2016-09-27 DIAGNOSIS — F438 Other reactions to severe stress: Secondary | ICD-10-CM | POA: Diagnosis not present

## 2016-10-04 DIAGNOSIS — F438 Other reactions to severe stress: Secondary | ICD-10-CM | POA: Diagnosis not present

## 2016-10-09 DIAGNOSIS — F438 Other reactions to severe stress: Secondary | ICD-10-CM | POA: Diagnosis not present

## 2016-10-16 DIAGNOSIS — F438 Other reactions to severe stress: Secondary | ICD-10-CM | POA: Diagnosis not present

## 2016-10-20 DIAGNOSIS — F438 Other reactions to severe stress: Secondary | ICD-10-CM | POA: Diagnosis not present

## 2016-11-01 DIAGNOSIS — F438 Other reactions to severe stress: Secondary | ICD-10-CM | POA: Diagnosis not present

## 2016-11-06 DIAGNOSIS — F438 Other reactions to severe stress: Secondary | ICD-10-CM | POA: Diagnosis not present

## 2016-11-16 DIAGNOSIS — F438 Other reactions to severe stress: Secondary | ICD-10-CM | POA: Diagnosis not present

## 2016-11-18 ENCOUNTER — Other Ambulatory Visit: Payer: Self-pay | Admitting: Rheumatology

## 2016-11-20 DIAGNOSIS — F438 Other reactions to severe stress: Secondary | ICD-10-CM | POA: Diagnosis not present

## 2016-11-22 ENCOUNTER — Other Ambulatory Visit: Payer: Self-pay | Admitting: *Deleted

## 2016-11-22 DIAGNOSIS — Z79899 Other long term (current) drug therapy: Secondary | ICD-10-CM

## 2016-11-22 DIAGNOSIS — M1A00X0 Idiopathic chronic gout, unspecified site, without tophus (tophi): Secondary | ICD-10-CM

## 2016-11-22 DIAGNOSIS — F438 Other reactions to severe stress: Secondary | ICD-10-CM | POA: Diagnosis not present

## 2016-11-22 LAB — CBC WITH DIFFERENTIAL/PLATELET
BASOS ABS: 0 {cells}/uL (ref 0–200)
Basophils Relative: 0 %
EOS ABS: 124 {cells}/uL (ref 15–500)
EOS PCT: 2 %
HCT: 44.8 % (ref 38.5–50.0)
Hemoglobin: 15.3 g/dL (ref 13.2–17.1)
Lymphocytes Relative: 19 %
Lymphs Abs: 1178 cells/uL (ref 850–3900)
MCH: 32.2 pg (ref 27.0–33.0)
MCHC: 34.2 g/dL (ref 32.0–36.0)
MCV: 94.3 fL (ref 80.0–100.0)
MONOS PCT: 10 %
MPV: 11.5 fL (ref 7.5–12.5)
Monocytes Absolute: 620 cells/uL (ref 200–950)
NEUTROS PCT: 69 %
Neutro Abs: 4278 cells/uL (ref 1500–7800)
PLATELETS: 204 10*3/uL (ref 140–400)
RBC: 4.75 MIL/uL (ref 4.20–5.80)
RDW: 14 % (ref 11.0–15.0)
WBC: 6.2 10*3/uL (ref 3.8–10.8)

## 2016-11-22 NOTE — Telephone Encounter (Signed)
ok 

## 2016-11-22 NOTE — Telephone Encounter (Signed)
Last Visit: 04/24/16 Next Visit: 12/01/16 Labs: 03/09/16 WNL Patient in office today to update labs  Okay to refill Allopurinol?

## 2016-11-23 LAB — COMPLETE METABOLIC PANEL WITH GFR
ALBUMIN: 4.2 g/dL (ref 3.6–5.1)
ALK PHOS: 53 U/L (ref 40–115)
ALT: 29 U/L (ref 9–46)
AST: 23 U/L (ref 10–35)
BILIRUBIN TOTAL: 0.7 mg/dL (ref 0.2–1.2)
BUN: 18 mg/dL (ref 7–25)
CO2: 27 mmol/L (ref 20–31)
Calcium: 9.3 mg/dL (ref 8.6–10.3)
Chloride: 105 mmol/L (ref 98–110)
Creat: 1.11 mg/dL (ref 0.70–1.25)
GFR, EST AFRICAN AMERICAN: 79 mL/min (ref >=60)
GFR, EST NON AFRICAN AMERICAN: 68 mL/min (ref >=60)
GLUCOSE: 102 mg/dL — AB (ref 65–99)
POTASSIUM: 4.4 mmol/L (ref 3.5–5.3)
SODIUM: 140 mmol/L (ref 135–146)
TOTAL PROTEIN: 6.5 g/dL (ref 6.1–8.1)

## 2016-11-23 LAB — URIC ACID: URIC ACID, SERUM: 7.9 mg/dL (ref 4.0–8.0)

## 2016-11-23 NOTE — Progress Notes (Signed)
Labs normal. Uric acid is better

## 2016-11-27 DIAGNOSIS — H43813 Vitreous degeneration, bilateral: Secondary | ICD-10-CM | POA: Diagnosis not present

## 2016-11-27 DIAGNOSIS — H0289 Other specified disorders of eyelid: Secondary | ICD-10-CM | POA: Diagnosis not present

## 2016-11-27 DIAGNOSIS — H02403 Unspecified ptosis of bilateral eyelids: Secondary | ICD-10-CM | POA: Diagnosis not present

## 2016-11-27 DIAGNOSIS — D3141 Benign neoplasm of right ciliary body: Secondary | ICD-10-CM | POA: Diagnosis not present

## 2016-11-27 DIAGNOSIS — H2513 Age-related nuclear cataract, bilateral: Secondary | ICD-10-CM | POA: Diagnosis not present

## 2016-11-27 DIAGNOSIS — D3142 Benign neoplasm of left ciliary body: Secondary | ICD-10-CM | POA: Diagnosis not present

## 2016-11-27 DIAGNOSIS — H11823 Conjunctivochalasis, bilateral: Secondary | ICD-10-CM | POA: Diagnosis not present

## 2016-11-27 NOTE — Progress Notes (Signed)
Office Visit Note  Patient: Jose [name]             Date of Birth: 05-20-1951           MRN: [id number]             PCP: [facility] Referring: Living, [facility] Visit Date: 12/01/2016 Occupation: @GUAROCC @    Subjective:  No chief complaint on file. Follow-up on gout and left knee joint pain  History of Present Illness: Jose [name] is a 65 y.o. male   Last seen 04/24/2016  Patient is having significant left knee joint pain and is requesting a cortisone injection in office today for indicated.  Patient does not have gout flare per se but he does have microflares from time to time. He states that he can have cooked seafood without any problems but he might have a microflaresfrom time to time. He states that he can drink some wine at times but he may have a microflares. We discussed that he is actually having flares but they're almost subclinical and this is damaging his joint over time with frequent flares and he should avoid these products that are causing him to have these flares. I advised the patient to practice better gout diet, and to stay hydrated. Currently he drinks about 3 glasses of 8 ounces of water daily. And this is considered to be better than what the patient has been doing in the past.  Last time the patient had Euflexxa in his knee joints was December 2016. Since his pain is started once again in his knees with left being worse than right, he is encouraged to restart his Euflex injections once again once approved by the insurance. Patient is actually agreeable and wants [place] to apply for this Euflexxa for his knees.  Activities of Daily Living:  Patient reports morning stiffness for 30 minutes.   Patient Denies nocturnal pain.  Difficulty dressing/grooming: Denies Difficulty climbing stairs: Reports Difficulty getting out of chair: Reports Difficulty using hands for taps, buttons, cutlery, and/or writing: Denies   No Rheumatology  ROS completed.   PMFS History:  Patient Active Problem List   Diagnosis Date Noted  . Idiopathic chronic gout, unspecified site, without tophus (tophi) 11/29/2016  . HEMATURIA UNSPECIFIED 01/25/2009  . ABDOMINAL PAIN 01/25/2009  . XERODERMA 03/10/2008  . UNS ADVRS EFF UNS RX MEDICINAL&BIOLOGICAL SBSTNC 03/10/2008  . ADJUSTMENT REACTION WITH PHYSICAL SYMPTOMS 02/14/2008  . MUSCLE SPASM 02/14/2008  . ACUTE PROSTATITIS 12/16/2007  . BREAST LUMP OR MASS, RIGHT 07/12/2007  . H/O: RCT (rotator cuff tear) 01/13/2007  . GOUT 01/08/2007    Past Medical History:  Diagnosis Date  . SDH (subdural hematoma) (HCC)     History reviewed. No pertinent family history. Past Surgical History:  Procedure Laterality Date  . CARPAL TUNNEL RELEASE    . SHOULDER SURGERY     Social History   Social History Narrative  . No narrative on file     Objective: Vital Signs: BP (!) 142/86 (BP Location: Left Arm, Patient Position: Sitting, Cuff Size: Normal)   Pulse 75   Resp 12   Ht 6' (1.829 m)   Wt 212 lb (96.2 kg)   BMI 28.75 kg/m    Physical Exam   Musculoskeletal Exam:  Full range of motion of all joints Grip strength is equal and strong bilaterally Fiber myalgia tender points are all absent  CDAI Exam: No CDAI exam completed.  No synovitis on examination  Investigation: No additional findings.   Imaging: No results found.  Speciality Comments: No specialty comments available.    Procedures:  Large Joint Inj Date/Time: 12/01/2016 1:30 PM Performed by: Eliezer Lofts Authorized by: Eliezer Lofts   Consent Given by:  Patient Site marked: the procedure site was marked   Timeout: prior to procedure the correct patient, procedure, and site was verified   Indications:  Pain and joint swelling Location:  Knee Site:  L knee Prep: patient was prepped and draped in usual sterile fashion   Needle Size:  27 G Needle Length:  1.5 inches Approach:  Medial Ultrasound Guidance:  No   Fluoroscopic Guidance: No   Arthrogram: No   Medications:  1.5 mL lidocaine 1 %; 40 mg triamcinolone acetonide 40 MG/ML Aspiration Attempted: Yes   Patient tolerance:  Patient tolerated the procedure well with no immediate complications  Left knee joint pain OA of bilateral knees Lasix Euflex injection to both knees was December 2016 Patient is requesting repeat Euflex injections in the near future after getting a cortisone today.    Allergies: Patient has no known allergies.   Assessment / Plan:     Visit Diagnoses: Primary osteoarthritis of both knees  Idiopathic chronic gout of multiple sites without tophus  H/O: RCT (rotator cuff tear)  Primary osteoarthritis of both hands  Uricacidemia    Left knee joint pain OA of bilateral knees Lasix Euflex injection to both knees was December 2016 Patient is requesting repeat Euflex injections in the near future after getting a cortisone today.   Plan: Return to clinic in 3 months CBC with differential CMP with GFR uric acid in 3 months (patient will return a few days before the actual appointment so he can get his blood drawn. Note that he wants to discuss the results in the office at the time of office visit.). Apply for Euflex of both knees 3 in January 2018 for eventual injection sometime in February March.  Refill allopurinol 3 mg daily. Note that I have advised the patient that since his uric acid is 7.9, he can use 450 mg per day for the next couple of weeks and then he can go to 300 mg daily.  Orders: Orders Placed This Encounter  Procedures  . Large Joint Injection/Arthrocentesis   Meds ordered this encounter  Medications  . allopurinol (ZYLOPRIM) 300 MG tablet    Sig: Take 1 tablet (300 mg total) by mouth daily. For the next 2 weeks starting today, patient will actually use 1-1/2 tablets every day for 2 weeks only,.    Dispense:  90 tablet    Refill:  1    Order Specific Question:   Supervising Provider     Answer:   Bo Merino [[id number]]  . colchicine 0.6 MG tablet    Sig: Take 1 tablet (0.6 mg total) by mouth daily as needed (FOR GOUT FLAREUPS).    Dispense:  30 tablet    Refill:  3    Order Specific Question:   Supervising Provider    Answer:   Bo Merino [phone]    Face-to-face time spent with patient was 30 minutes. 50% of time was spent in counseling and coordination of care.  Follow-Up Instructions: Return in about 3 months (around 03/01/2017) for gout, knee pain,oa hands, hyperurecemia.   Eliezer Lofts, PA-C

## 2016-11-29 DIAGNOSIS — M1A00X0 Idiopathic chronic gout, unspecified site, without tophus (tophi): Secondary | ICD-10-CM | POA: Insufficient documentation

## 2016-12-01 ENCOUNTER — Ambulatory Visit (INDEPENDENT_AMBULATORY_CARE_PROVIDER_SITE_OTHER): Payer: Medicare Other | Admitting: Rheumatology

## 2016-12-01 ENCOUNTER — Encounter: Payer: Self-pay | Admitting: Rheumatology

## 2016-12-01 VITALS — BP 142/86 | HR 75 | Resp 12 | Ht 72.0 in | Wt 212.0 lb

## 2016-12-01 DIAGNOSIS — M170 Bilateral primary osteoarthritis of knee: Secondary | ICD-10-CM

## 2016-12-01 DIAGNOSIS — M19041 Primary osteoarthritis, right hand: Secondary | ICD-10-CM | POA: Diagnosis not present

## 2016-12-01 DIAGNOSIS — M25562 Pain in left knee: Secondary | ICD-10-CM

## 2016-12-01 DIAGNOSIS — E790 Hyperuricemia without signs of inflammatory arthritis and tophaceous disease: Secondary | ICD-10-CM

## 2016-12-01 DIAGNOSIS — Z8739 Personal history of other diseases of the musculoskeletal system and connective tissue: Secondary | ICD-10-CM

## 2016-12-01 DIAGNOSIS — M1A09X0 Idiopathic chronic gout, multiple sites, without tophus (tophi): Secondary | ICD-10-CM | POA: Diagnosis not present

## 2016-12-01 DIAGNOSIS — M19042 Primary osteoarthritis, left hand: Secondary | ICD-10-CM

## 2016-12-01 MED ORDER — LIDOCAINE HCL 1 % IJ SOLN
1.5000 mL | INTRAMUSCULAR | Status: AC | PRN
  Administered 2016-12-01: 1.5 mL

## 2016-12-01 MED ORDER — ALLOPURINOL 300 MG PO TABS: 300.0000 mg | ORAL_TABLET | Freq: Every day | ORAL | 1 refills | Status: DC

## 2016-12-01 MED ORDER — TRIAMCINOLONE ACETONIDE 40 MG/ML IJ SUSP
40.0000 mg | INTRAMUSCULAR | Status: AC | PRN
  Administered 2016-12-01: 40 mg via INTRA_ARTICULAR

## 2016-12-01 MED ORDER — COLCHICINE 0.6 MG PO TABS
0.6000 mg | ORAL_TABLET | Freq: Every day | ORAL | 3 refills | Status: DC | PRN
Start: 2016-12-01 — End: 2018-07-24

## 2016-12-01 NOTE — Patient Instructions (Signed)

## 2016-12-05 ENCOUNTER — Other Ambulatory Visit: Payer: Self-pay | Admitting: *Deleted

## 2016-12-05 DIAGNOSIS — F438 Other reactions to severe stress: Secondary | ICD-10-CM | POA: Diagnosis not present

## 2016-12-07 DIAGNOSIS — F438 Other reactions to severe stress: Secondary | ICD-10-CM | POA: Diagnosis not present

## 2016-12-12 DIAGNOSIS — I671 Cerebral aneurysm, nonruptured: Secondary | ICD-10-CM | POA: Diagnosis not present

## 2016-12-13 DIAGNOSIS — F438 Other reactions to severe stress: Secondary | ICD-10-CM | POA: Diagnosis not present

## 2016-12-19 DIAGNOSIS — F438 Other reactions to severe stress: Secondary | ICD-10-CM | POA: Diagnosis not present

## 2016-12-24 DIAGNOSIS — R05 Cough: Secondary | ICD-10-CM | POA: Diagnosis not present

## 2016-12-24 DIAGNOSIS — J209 Acute bronchitis, unspecified: Secondary | ICD-10-CM | POA: Diagnosis not present

## 2016-12-24 DIAGNOSIS — R52 Pain, unspecified: Secondary | ICD-10-CM | POA: Diagnosis not present

## 2016-12-26 DIAGNOSIS — F438 Other reactions to severe stress: Secondary | ICD-10-CM | POA: Diagnosis not present

## 2016-12-27 ENCOUNTER — Other Ambulatory Visit: Payer: Self-pay | Admitting: *Deleted

## 2016-12-27 DIAGNOSIS — I671 Cerebral aneurysm, nonruptured: Secondary | ICD-10-CM | POA: Diagnosis not present

## 2016-12-27 DIAGNOSIS — I712 Thoracic aortic aneurysm, without rupture, unspecified: Secondary | ICD-10-CM

## 2016-12-27 DIAGNOSIS — Z9889 Other specified postprocedural states: Secondary | ICD-10-CM | POA: Diagnosis not present

## 2016-12-27 DIAGNOSIS — R51 Headache: Secondary | ICD-10-CM | POA: Diagnosis not present

## 2016-12-27 DIAGNOSIS — I726 Aneurysm of vertebral artery: Secondary | ICD-10-CM | POA: Diagnosis not present

## 2016-12-29 DIAGNOSIS — F438 Other reactions to severe stress: Secondary | ICD-10-CM | POA: Diagnosis not present

## 2017-01-04 ENCOUNTER — Ambulatory Visit (INDEPENDENT_AMBULATORY_CARE_PROVIDER_SITE_OTHER): Payer: Medicare Other | Admitting: Cardiothoracic Surgery

## 2017-01-04 ENCOUNTER — Encounter: Payer: Self-pay | Admitting: Cardiothoracic Surgery

## 2017-01-04 ENCOUNTER — Ambulatory Visit
Admission: RE | Admit: 2017-01-04 | Discharge: 2017-01-04 | Disposition: A | Discharge status: Home / Self Care | Payer: Medicare Other | Source: Ambulatory Visit | Attending: Cardiothoracic Surgery | Admitting: Cardiothoracic Surgery

## 2017-01-04 VITALS — BP 134/88 | HR 72 | Resp 20 | Ht 72.0 in | Wt 212.0 lb

## 2017-01-04 DIAGNOSIS — Q231 Congenital insufficiency of aortic valve: Secondary | ICD-10-CM

## 2017-01-04 DIAGNOSIS — Q2381 Bicuspid aortic valve: Secondary | ICD-10-CM

## 2017-01-04 DIAGNOSIS — I712 Thoracic aortic aneurysm, without rupture, unspecified: Secondary | ICD-10-CM

## 2017-01-04 MED ORDER — IOPAMIDOL (ISOVUE-370) INJECTION 76%
75.0000 mL | Freq: Once | INTRAVENOUS | Status: AC | PRN
  Administered 2017-01-04: 75 mL via INTRAVENOUS

## 2017-01-04 NOTE — Progress Notes (Signed)
[address]       [place] [address]             [phone]                    Rayman A Cinnamon [place] Medical Record [id number] Date of Birth: 24-Jul-1951  Referring: Lacretia Leigh, MD Primary Care: [facility]  Chief Complaint:    Chief Complaint  Patient presents with  . Thoracic Aortic Aneurysm    1 year f/u with CTA Chest    History of Present Illness:    Jose [name] 66 y.o. male was first seen in the office 07/06/2015  referred by the emergency room at [facility]  because of a dilated ascending aorta. The patient has no previous history of cardiac disease, no history of known coronary artery disease or valvular disease.  The patient has a history of intracranial bleed/ subdural hematoma without history of trauma treated in [place].   He underwent right craniotomy.  In July 2016 he was having increasing cough discussed this with his neurologist who recommended that he be seen and have a CAT scan of the head done. While in the emergency room a CT scan of the chest was also done., Demonstrating mild dilatation of the ascending aorta. The patient is a nonsmoker.  He denies any symptoms of angina, has not had an echocardiogram done recently.   History family history is significant for his father who died in his late 53s with myocardial infarction, after having his first myocardial infarction in his 76s. There is no family history of aortic aneurysms, unexplained sudden death at an early age or aortic dissection.He has one cousin who died in his 28s of sudden collapse thought to be a brain aneurysm   Since last seen he notes that he has been paying more attention to his overall health, working on controlling his weight and blood pressure. He's had no symptoms of heart failure or angina.   Current Activity/ Functional Status:  Patient is independent with mobility/ambulation, transfers, ADL's, IADL's.   Zubrod Score: At the time of surgery  this patient's most appropriate activity status/level should be described as: [x]     0    Normal activity, no symptoms []     1    Restricted in physical strenuous activity but ambulatory, able to do out light work []     2    Ambulatory and capable of self care, unable to do work activities, up and about               >50 % of waking hours                              []     3    Only limited self care, in bed greater than 50% of waking hours []     4    Completely disabled, no self care, confined to bed or chair []     5    Moribund   Past Medical History:  Diagnosis Date  . SDH (subdural hematoma) (HCC)     Past Surgical History:  Procedure Laterality Date  . CARPAL TUNNEL RELEASE    . SHOULDER SURGERY      FAmily history: Both of the patient's parents are deceased his father died at age 40 of myocardial infarction previously in his early 72s he had 2 myocardial infarctions.  His mother died with Parkinson's at age 34. He has a living brother and sister with history of diabetes Parkinson's and depression, he has one son age 12. He has one cousin who died in his 34s of sudden collapse thought to be a brain aneurysm.   History   Social History  . Marital Status: Married    Spouse Name: N/A  . Number of Children: N/A  . Years of Education: N/A   Occupational History  Patient works as a Optometrist in the [employer]  . Smoking status: Never Smoker   . Smokeless tobacco: Not on file  . Alcohol Use: No  . Drug Use: No  . Sexual Activity: Not on file     History  Smoking Status  . Never Smoker  Smokeless Tobacco  . Never Used    History  Alcohol Use  . 1.8 oz/week  . 2 Glasses of wine, 1 Shots of liquor per week     No Known Allergies  Current Outpatient Prescriptions  Medication Sig Dispense Refill  . allopurinol (ZYLOPRIM) 300 MG tablet Take 1 tablet (300 mg total) by mouth daily. For the next 2 weeks starting today, patient will  actually use 1-1/2 tablets every day for 2 weeks only,. 90 tablet 1  . colchicine 0.6 MG tablet Take 1 tablet (0.6 mg total) by mouth daily as needed (FOR GOUT FLAREUPS). 30 tablet 3  . finasteride (PROSCAR) 5 MG tablet TAKE 1 TABLET ORAL DAILY,X90 DAY(S)  3  . Multiple Vitamin (MULTIVITAMIN WITH MINERALS) TABS tablet Take 1 tablet by mouth daily.    . tamsulosin (FLOMAX) 0.4 MG CAPS capsule Take 0.4 mg by mouth daily after breakfast.     No current facility-administered medications for this visit.       Review of Systems:     Cardiac Review of Systems: Y or N  Chest Pain [  n  ]  Resting SOB [  n ] Exertional SOB  [n  ]  Orthopnea [ n ]   Pedal Edema [ n  ]    Palpitations [ n ] Syncope  [ n ]   Presyncope [  n ]  General Review of Systems: [Y] = yes [  ]=no Constitional: recent weight change [  ];  Wt loss over the last 3 months [   ] anorexia [ n ]; fatigue [  ]; nausea [  ]; night sweats [  ]; fever n[  ]; or chills [  ];          Dental: poor dentition[ n ]; Last Dentist visit:   Eye : blurred vision [  ]; diplopia [   ]; vision changes [  ];  Amaurosis fugax[  ]; Resp: cough [ y ];  wheezing[ n ];  hemoptysis[ n ]; shortness of breath[n  ]; paroxysmal nocturnal dyspnea[  ]; dyspnea on exertion[  ]; or orthopnea[  ];  GI:  gallstones[  ], vomiting[  ];  dysphagia[  ]; melena[ n ];  hematochezia [  ]; heartburn[  ];   Hx of  Colonoscopy[  ]; GU: kidney stones [  ]; hematuria[  ];   dysuria [  ];  nocturia[  ];  history of     obstruction [  ]; urinary frequency [ y ]             Skin: rash, swelling[  ];, hair loss[  ];  peripheral edema[  ];  or itching[  ]; Musculosketetal: myalgias[  ];  joint swelling[  ];  joint erythema[  ];  joint pain[  ];  back pain[  ]; history of gout  Heme/Lymph: bruising[  ];  bleeding[  ];  anemia[  ];  Neuro: TIA[  ];  headaches[  ];  stroke[  ];  vertigo[  ];  seizures[  ];   paresthesias[  ];  difficulty walking[  ];  Psych:depression[ n ]; anxiety[ n  ];  Endocrine: diabetes[ n ];  thyroid dysfunction[n  ];  Immunizations: Flu up to date [n  ]; Pneumococcal up to date [ n ];  Other:  Physical Exam: BP 134/88   Pulse 72   Resp 20   Ht 6' (1.829 m)   Wt 212 lb (96.2 kg)   BMI 28.75 kg/m   PHYSICAL EXAMINATION: General appearance: alert, cooperative, appears stated age and no distress Head: Normocephalic, without obvious abnormality, atraumatic Neck: no adenopathy, no carotid bruit, no JVD, supple, symmetrical, trachea midline and thyroid not enlarged, symmetric, no tenderness/mass/nodules Lymph nodes: Cervical, supraclavicular, and axillary nodes normal. Resp: clear to auscultation bilaterally Back: symmetric, no curvature. ROM normal. No CVA tenderness. Cardio: regular rate and rhythm, S1, S2 normal, no murmur, click, rub or gallop GI: soft, non-tender; bowel sounds normal; no masses,  no organomegaly Extremities: extremities normal, atraumatic, no cyanosis or edema and Homans sign is negative, no sign of DVT Neurologic: Grossly normal  Patient has 2+ DP and PT pulses bilaterally Brachial pulses are equal and bilateral radial pulses are equal and bilateral  Diagnostic Studies & Laboratory data:     Recent Radiology Findings:   Ct Angio Chest Aorta W &/or Wo Contrast  Result Date: 01/04/2017 CLINICAL DATA:  Follow-up of ascending thoracic aortic aneurysm. EXAM: CT ANGIOGRAPHY CHEST WITH CONTRAST TECHNIQUE: Multidetector CT imaging of the chest was performed using the standard protocol during bolus administration of intravenous contrast. Multiplanar CT image reconstructions and MIPs were obtained to evaluate the vascular anatomy. CONTRAST:  75 mL of Isovue 370 COMPARISON:  January 06, 2016 FINDINGS: Cardiovascular: The thoracic aorta measures 4.3 cm in AP diameter at its root root on series 3, image 59 versus 4.3 cm previously. The thoracic aorta measures 4.5 cm at the level of the right main pulmonary artery, unchanged. The thoracic  aorta measures 4.1 cm distally, as it enters the arch on series 3, image 36 versus 4.1 cm previously. The remainder of the thoracic aorta is normal in caliber. No dissection. The vessels off of the thoracic aortic arch are stable. Central pulmonary arteries are unremarkable. The heart size is normal and unchanged. No effusions. Mediastinum/Nodes: No enlarged mediastinal, hilar, or axillary lymph nodes. Thyroid gland, trachea, and esophagus demonstrate no significant findings. Lungs/Pleura: Lungs are clear. No pleural effusion or pneumothorax. 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 There is hepatic steatosis. The upper abdomen is otherwise unremarkable. Musculoskeletal: Degenerative changes seen in the thoracic spine. No other bony abnormalities. Review of the MIP images confirms the above findings. IMPRESSION: 1. Aneurysmal dilatation of the ascending thoracic aorta measuring up to 4.5 cm, unchanged in the interval. 2. 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 3. No other acute abnormalities. Ascending thoracic aortic aneurysm. Recommend semi-annual imaging followup by CTA or MRA and referral to cardiothoracic surgery if not already obtained. This recommendation follows 2010 ACCF/AHA/AATS/ACR/ASA/SCA/SCAI/SIR/STS/SVM Guidelines for the Diagnosis  and Management of Patients With Thoracic Aortic Disease. Circulation. 2010; [id number] Electronically Signed   By: Dorise Bullion III M.D   On: 01/04/2017 14:00     I have independently reviewed the above radiology studies  and reviewed the findings with the patient.  ECHO:02/07/2016 Echocardiography  Patient:    Jose [name], Jose [name] MR #:       [id number] Study Date: 02/07/2016 Gender:     M Age:        18 Height:     182.9 cm Weight:     88 kg BSA:        2.13 m^2 Pt. Status: Room:   ATTENDING    Lanelle Bal  MD  ORDERING     Lanelle Bal MD  SONOGRAPHER  Marygrace Drought, RCS  PERFORMING   [facility], Outpatient  cc:  ------------------------------------------------------------------- LV EF: 55% -   60%  ------------------------------------------------------------------- Indications:      Aortic valve disorder (I35.1).  ------------------------------------------------------------------- Study Conclusions  - Left ventricle: Abnormal septal motion The cavity size was   normal. Systolic function was normal. The estimated ejection   fraction was in the range of 55% to 60%. - Aortic valve: Bicuspid aortic valve Bicuspid. There was mild   stenosis. Valve area (VTI): 1.32 cm^2. Valve area (Vmax): 1.23   cm^2. Valve area (Vmean): 1.15 cm^2. - Atrial septum: No defect or patent foramen ovale was identified.  Echocardiography.  M-mode, complete 2D, spectral Doppler, and color Doppler.  Birthdate:  Patient birthdate: June 15, 1951.  Age:  Patient is 66 yr old.  Sex:  Gender: male.    BMI: 26.3 kg/m^2.  Blood pressure:     150/103  Patient status:  Outpatient.  Study date: Study date: 02/07/2016. Study time: 02:31 PM.  Location:  [place] [facility]  -------------------------------------------------------------------  ------------------------------------------------------------------- Left ventricle:  Abnormal septal motion The cavity size was normal. Systolic function was normal. The estimated ejection fraction was in the range of 55% to 60%.  ------------------------------------------------------------------- Aortic valve:  Bicuspid aortic valve  Bicuspid.  Doppler:   There was mild stenosis.      VTI ratio of LVOT to aortic valve: 0.29. Valve area (VTI): 1.32 cm^2. Indexed valve area (VTI): 0.62 cm^2/m^2. Peak velocity ratio of LVOT to aortic valve: 0.27. Valve area (Vmax): 1.23 cm^2. Indexed valve area (Vmax): 0.58 cm^2/m^2. Mean velocity ratio of LVOT to aortic valve: 0.26. Valve  area (Vmean): 1.15 cm^2. Indexed valve area (Vmean): 0.54 cm^2/m^2. Mean gradient (S): 18 mm Hg. Peak gradient (S): 31 mm Hg.  ------------------------------------------------------------------- Aorta:  Ascending aorta: The ascending aorta was mildly dilated.  ------------------------------------------------------------------- Mitral valve:   Doppler:  There was no significant regurgitation.   ------------------------------------------------------------------- Left atrium:  The atrium was normal in size.  ------------------------------------------------------------------- Atrial septum:  No defect or patent foramen ovale was identified.   ------------------------------------------------------------------- Right ventricle:  The cavity size was normal. Wall thickness was normal. Systolic function was normal.  ------------------------------------------------------------------- Pulmonic valve:    Doppler:  There was trivial regurgitation.  ------------------------------------------------------------------- Tricuspid valve:   Doppler:  There was mild regurgitation.  ------------------------------------------------------------------- Right atrium:  The atrium was normal in size.  ------------------------------------------------------------------- Pericardium:  The pericardium was normal in appearance.  ------------------------------------------------------------------- Systemic veins: Inferior vena cava: The vessel was normal in size. The respirophasic diameter changes were in the normal range (= 50%), consistent with normal central venous pressure. Diameter: 18 mm.  ------------------------------------------------------------------- Measurements   IVC  Value          Reference  ID                                        18    mm       ---------    Left ventricle                            Value          Reference  LV ID, ED, PLAX  chordal           (L)     38.7  mm       43 - 52  LV ID, ES, PLAX chordal                   27    mm       23 - 38  LV fx shortening, PLAX chordal            30    %        >=29  LV PW thickness, ED                       10.9  mm       ---------  IVS/LV PW ratio, ED                       0.6            <=1.3  Stroke volume, 2D                         71    ml       ---------  Stroke volume/bsa, 2D                     33    ml/m^2   ---------  LV ejection fraction, 1-p A4C             64    %        ---------  LV e&', lateral                            6.25  cm/s     ---------  LV E/e&', lateral                          8.21           ---------  LV e&', medial                             4.5   cm/s     ---------  LV E/e&', medial                           11.4           ---------  LV e&', average                            5.38  cm/s     ---------  LV E/e&', average  9.54           ---------    Ventricular septum                        Value          Reference  IVS thickness, ED                         6.58  mm       ---------    LVOT                                      Value          Reference  LVOT ID, S                                24    mm       ---------  LVOT area                                 4.52  cm^2     ---------  LVOT peak velocity, S                     75.9  cm/s     ---------  LVOT mean velocity, S                     50.8  cm/s     ---------  LVOT VTI, S                               15.6  cm       ---------  LVOT peak gradient, S                     2     mm Hg    ---------    Aortic valve                              Value          Reference  Aortic valve peak velocity, S             280   cm/s     ---------  Aortic valve mean velocity, S             199   cm/s     ---------  Aortic valve VTI, S                       53.6  cm       ---------  Aortic mean gradient, S                   18    mm Hg    ---------  Aortic peak gradient, S                    31    mm Hg    ---------  VTI ratio, LVOT/AV  0.29           ---------  Aortic valve area, VTI                    1.32  cm^2     ---------  Aortic valve area/bsa, VTI                0.62  cm^2/m^2 ---------  Velocity ratio, peak, LVOT/AV             0.27           ---------  Aortic valve area, peak velocity          1.23  cm^2     ---------  Aortic valve area/bsa, peak               0.58  cm^2/m^2 ---------  velocity  Velocity ratio, mean, LVOT/AV             0.26           ---------  Aortic valve area, mean velocity          1.15  cm^2     ---------  Aortic valve area/bsa, mean               0.54  cm^2/m^2 ---------  velocity    Aorta                                     Value          Reference  Aortic root ID, ED                        32    mm       ---------    Left atrium                               Value          Reference  LA ID, A-P, ES                            34    mm       ---------  LA ID/bsa, A-P                            1.6   cm/m^2   <=2.2  LA volume, S                              27    ml       ---------  LA volume/bsa, S                          12.7  ml/m^2   ---------  LA volume, ES, 1-p A4C                    18    ml       ---------  LA volume/bsa, ES, 1-p A4C                8.5   ml/m^2   ---------  LA volume, ES, 1-p A2C  42    ml       ---------  LA volume/bsa, ES, 1-p A2C                19.8  ml/m^2   ---------    Mitral valve                              Value          Reference  Mitral E-wave peak velocity               51.3  cm/s     ---------  Mitral A-wave peak velocity               77    cm/s     ---------  Mitral deceleration time          (H)     370   ms       150 - 230  Mitral E/A ratio, peak                    0.7            ---------    Pulmonary arteries                        Value          Reference  PA pressure, S, DP                        19    mm Hg    <=30    Tricuspid valve                            Value          Reference  Tricuspid regurg peak velocity            198   cm/s     ---------  Tricuspid peak RV-RA gradient             16    mm Hg    ---------  Tricuspid maximal regurg                  198   cm/s     ---------  velocity, PISA    Systemic veins                            Value          Reference  Estimated CVP                             3     mm Hg    ---------    Right ventricle                           Value          Reference  RV pressure, S, DP                        19    mm Hg    <=30  RV s&', lateral, S  7.46  cm/s     ---------  Legend: (L)  and  (H)  mark values outside specified reference range.  ------------------------------------------------------------------- Prepared and Electronically Authenticated by  [name], M.D. 2017-02-20T15:34:44  Recent Lab Findings: Lab Results  Component Value Date   WBC 6.2 11/22/2016   HGB 15.3 11/22/2016   HCT 44.8 11/22/2016   PLT 204 11/22/2016   GLUCOSE 102 (H) 11/22/2016   CHOL 243 (HH) 12/25/2007   TRIG 126 12/25/2007   HDL 66.4 12/25/2007   LDLDIRECT 164.4 12/25/2007   ALT 29 11/22/2016   AST 23 11/22/2016   NA 140 11/22/2016   K 4.4 11/22/2016   CL 105 11/22/2016   CREATININE 1.11 11/22/2016   BUN 18 11/22/2016   CO2 27 11/22/2016    Aortic Size Index=   4.5     /Body surface area is 2.21 meters squared. = 2.03  < 2.75 cm/m2      4% risk per year 2.75 to 4.25          8% risk per year > 4.25 cm/m2    20% risk per year   Assessment / Plan:    Dilatation of the ascending aorta 4.5  without other stigmata of connective tissue disorder or family history of dilated aorta or dissection. But  With echo documented bicuspid aortic valve with mild stenosis.   I've cautioned the patient about competitive or strenuous weightlifting involving Valsalva, and also the need for good blood pressure control  With abnormal valve the patient was cautioned about  importance of continuing with good dental care  According to the 2010 ACC/AHA guidelines, we recommend patients with thoracic aortic disease to maintain a LDL of less than 70 and a HDL of greater than 50. We recommend their blood pressure to remain less than 135/85.   We will plan on seeing him back with a follow-up CTA of the chest in 12 months,   He will make a follow-up appointment with cardiology- Dr. Ethlyn Daniels MD      [address] [place] [address] Office [phone]   Beeper [phone]  01/04/2017 2:29 PM

## 2017-01-07 ENCOUNTER — Encounter (HOSPITAL_COMMUNITY): Payer: Self-pay | Admitting: Oncology

## 2017-01-07 ENCOUNTER — Emergency Department (HOSPITAL_COMMUNITY): Payer: Medicare Other

## 2017-01-07 ENCOUNTER — Emergency Department (HOSPITAL_COMMUNITY)
Admission: EM | Admit: 2017-01-07 | Discharge: 2017-01-08 | Disposition: A | Discharge status: Home / Self Care | Payer: Medicare Other | Attending: Emergency Medicine | Admitting: Emergency Medicine

## 2017-01-07 DIAGNOSIS — H538 Other visual disturbances: Secondary | ICD-10-CM | POA: Diagnosis not present

## 2017-01-07 DIAGNOSIS — Z79899 Other long term (current) drug therapy: Secondary | ICD-10-CM | POA: Insufficient documentation

## 2017-01-07 DIAGNOSIS — Y999 Unspecified external cause status: Secondary | ICD-10-CM | POA: Insufficient documentation

## 2017-01-07 DIAGNOSIS — W19XXXA Unspecified fall, initial encounter: Secondary | ICD-10-CM

## 2017-01-07 DIAGNOSIS — W000XXA Fall on same level due to ice and snow, initial encounter: Secondary | ICD-10-CM | POA: Diagnosis not present

## 2017-01-07 DIAGNOSIS — Y929 Unspecified place or not applicable: Secondary | ICD-10-CM | POA: Insufficient documentation

## 2017-01-07 DIAGNOSIS — Y939 Activity, unspecified: Secondary | ICD-10-CM | POA: Diagnosis not present

## 2017-01-07 DIAGNOSIS — M25511 Pain in right shoulder: Secondary | ICD-10-CM | POA: Insufficient documentation

## 2017-01-07 DIAGNOSIS — S199XXA Unspecified injury of neck, initial encounter: Secondary | ICD-10-CM | POA: Diagnosis not present

## 2017-01-07 DIAGNOSIS — M25551 Pain in right hip: Secondary | ICD-10-CM | POA: Insufficient documentation

## 2017-01-07 DIAGNOSIS — S0990XA Unspecified injury of head, initial encounter: Secondary | ICD-10-CM | POA: Insufficient documentation

## 2017-01-07 DIAGNOSIS — S79911A Unspecified injury of right hip, initial encounter: Secondary | ICD-10-CM | POA: Diagnosis not present

## 2017-01-07 NOTE — ED Triage Notes (Signed)
Per pt he fell last night after slipping on ice and hit his head.  Pt states he did not have LOC.  Pt w/ hx of subdural hematoma.  Denies anticoagulant use.  Per pt he has felt, "Spacy" today.  Pt is A&O x 4.  Rates pain 7/10.  +blurred vision.

## 2017-01-07 NOTE — ED Provider Notes (Signed)
[facility] DEPT Provider Note   CSN: [id number] Arrival date & time: 01/07/17  2147  By signing my name below, I, Neta Mends, attest that this documentation has been prepared under the direction and in the presence of Calah Gershman, MD . Electronically Signed: Neta Mends, ED Scribe. 01/07/2017. 11:22 PM.    History   Chief Complaint Chief Complaint  Patient presents with  . Head Injury    The history is provided by the patient. No language interpreter was used.  Fall  This is a recurrent problem. The current episode started yesterday. The problem occurs rarely. The problem has not changed since onset.Pertinent negatives include no chest pain, no abdominal pain and no shortness of breath. Nothing aggravates the symptoms. Nothing relieves the symptoms. He has tried acetaminophen for the symptoms. The treatment provided no relief.   HPI Comments:  Jose [name] is a 66 y.o. male with PMHx of subdural hematoma who presents to the Emergency Department, here due to a fall and head injury that occurred 24 hours ago. Pt reports that he slipped on ice and hit his right hip, right shoulder, and head. Pt complains of continuous right hip and right shoulder pain since the fall occurred which he rates at 7/10. Per triage note, pt reports associated blurred vision. Pt has been ambulatory. Pt does not take any anticoagulates. Pt denies any seizure-like activity. No alleviating factors noted. Pt denies LOC.   Past Medical History:  Diagnosis Date  . SDH (subdural hematoma) [facility])     Patient Active Problem List   Diagnosis Date Noted  . Idiopathic chronic gout, unspecified site, without tophus (tophi) 11/29/2016  . HEMATURIA UNSPECIFIED 01/25/2009  . ABDOMINAL PAIN 01/25/2009  . XERODERMA 03/10/2008  . UNS ADVRS EFF UNS RX MEDICINAL&BIOLOGICAL SBSTNC 03/10/2008  . ADJUSTMENT REACTION WITH PHYSICAL SYMPTOMS 02/14/2008  . MUSCLE SPASM 02/14/2008  . ACUTE PROSTATITIS  12/16/2007  . BREAST LUMP OR MASS, RIGHT 07/12/2007  . H/O: RCT (rotator cuff tear) 01/13/2007  . GOUT 01/08/2007    Past Surgical History:  Procedure Laterality Date  . CARPAL TUNNEL RELEASE    . SHOULDER SURGERY         Home Medications    Prior to Admission medications   Medication Sig Start Date End Date Taking? Authorizing Provider  allopurinol (ZYLOPRIM) 300 MG tablet Take 1 tablet (300 mg total) by mouth daily. For the next 2 weeks starting today, patient will actually use 1-1/2 tablets every day for 2 weeks only,. 12/01/16 05/30/17  Naitik Panwala, PA-C  colchicine 0.6 MG tablet Take 1 tablet (0.6 mg total) by mouth daily as needed (FOR GOUT FLAREUPS). 12/01/16   Naitik Panwala, PA-C  finasteride (PROSCAR) 5 MG tablet TAKE 1 TABLET ORAL DAILY,X90 DAY(S) 08/28/16   Historical Provider, MD  Multiple Vitamin (MULTIVITAMIN WITH MINERALS) TABS tablet Take 1 tablet by mouth daily.    Historical Provider, MD  tamsulosin (FLOMAX) 0.4 MG CAPS capsule Take 0.4 mg by mouth daily after breakfast.    Historical Provider, MD    Family History No family history on file.  Social History Social History  Substance Use Topics  . Smoking status: Never Smoker  . Smokeless tobacco: Never Used  . Alcohol use 1.8 oz/week    2 Glasses of wine, 1 Shots of liquor per week     Allergies   Patient has no known allergies.   Review of Systems Review of Systems  Eyes: Positive for visual disturbance.  Respiratory: Negative for  shortness of breath.   Cardiovascular: Negative for chest pain.  Gastrointestinal: Negative for abdominal pain.  Musculoskeletal: Positive for arthralgias.  Neurological: Negative for syncope.  All other systems reviewed and are negative.    Physical Exam Updated Vital Signs BP (!) 156/110 (BP Location: Left Arm)   Pulse (!) 55   Temp 98.3 F (36.8 C) (Oral)   Resp 18   Ht 6' (1.829 m)   Wt 197 lb (89.4 kg)   SpO2 98%   BMI 26.72 kg/m   Physical Exam    Constitutional: He is oriented to person, place, and time. He appears well-developed and well-nourished. No distress.  HENT:  Head: Normocephalic and atraumatic.  Mouth/Throat: Oropharynx is clear and moist. No oropharyngeal exudate.  Trachea midline. No battle's sign, no raccoon eyes. No hemotympanum on right or left.   Eyes: Conjunctivae and EOM are normal. Pupils are equal, round, and reactive to light.  Neck: Trachea normal and normal range of motion. Neck supple. No JVD present. Carotid bruit is not present.  Cardiovascular: Normal rate, regular rhythm and intact distal pulses.  Exam reveals no gallop and no friction rub.   No murmur heard. Pulmonary/Chest: Effort normal and breath sounds normal. No stridor. He has no wheezes. He has no rales.  Lungs clear.  Abdominal: Soft. Bowel sounds are normal. He exhibits no mass. There is no tenderness. There is no rebound and no guarding.  Musculoskeletal: Normal range of motion.  No foreshortening or external rotation. Negative Neers test on the right. Clavicles stable. Pelvis stable.  Lymphadenopathy:    He has no cervical adenopathy.  Neurological: He is alert and oriented to person, place, and time. He has normal reflexes. He displays normal reflexes. No cranial nerve deficit. He exhibits normal muscle tone. Coordination normal.  Cranial nerves 2-12 intact  Skin: Skin is warm and dry. He is not diaphoretic.  Psychiatric: He has a normal mood and affect. His behavior is normal.     ED Treatments / Results  DIAGNOSTIC STUDIES:  Oxygen Saturation is 98% on RA, normal by my interpretation.    COORDINATION OF CARE:  11:22 PM Discussed treatment plan with pt at bedside and pt agreed to plan.   Radiology  Results for orders placed or performed in visit on 11/22/16  Uric acid  Result Value Ref Range   Uric Acid, Serum 7.9 4.0 - 8.0 mg/dL  CBC with Differential/Platelet  Result Value Ref Range   WBC 6.2 3.8 - 10.8 K/uL   RBC 4.75  4.20 - 5.80 MIL/uL   Hemoglobin 15.3 13.2 - 17.1 g/dL   HCT 44.8 38.5 - 50.0 %   MCV 94.3 80.0 - 100.0 fL   MCH 32.2 27.0 - 33.0 pg   MCHC 34.2 32.0 - 36.0 g/dL   RDW 14.0 11.0 - 15.0 %   Platelets 204 140 - 400 K/uL   MPV 11.5 7.5 - 12.5 fL   Neutro Abs 4,278 1,500 - 7,800 cells/uL   Lymphs Abs 1,178 850 - 3,900 cells/uL   Monocytes Absolute 620 200 - 950 cells/uL   Eosinophils Absolute 124 15 - 500 cells/uL   Basophils Absolute 0 0 - 200 cells/uL   Neutrophils Relative % 69 %   Lymphocytes Relative 19 %   Monocytes Relative 10 %   Eosinophils Relative 2 %   Basophils Relative 0 %   Smear Review Criteria for review not met   COMPLETE METABOLIC PANEL WITH GFR  Result Value Ref Range  Sodium 140 135 - 146 mmol/L   Potassium 4.4 3.5 - 5.3 mmol/L   Chloride 105 98 - 110 mmol/L   CO2 27 20 - 31 mmol/L   Glucose, Bld 102 (H) 65 - 99 mg/dL   BUN 18 7 - 25 mg/dL   Creat 1.11 0.70 - 1.25 mg/dL   Total Bilirubin 0.7 0.2 - 1.2 mg/dL   Alkaline Phosphatase 53 40 - 115 U/L   AST 23 10 - 35 U/L   ALT 29 9 - 46 U/L   Total Protein 6.5 6.1 - 8.1 g/dL   Albumin 4.2 3.6 - 5.1 g/dL   Calcium 9.3 8.6 - 10.3 mg/dL   GFR, Est African American 79 >=60 mL/min   GFR, Est Non African American 68 >=60 mL/min   Ct Head Wo Contrast  Result Date: 01/08/2017 CLINICAL DATA:  Slipped on ice hitting posterior head. Blurry vision. No loss consciousness. EXAM: CT HEAD WITHOUT CONTRAST CT CERVICAL SPINE WITHOUT CONTRAST TECHNIQUE: Multidetector CT imaging of the head and cervical spine was performed following the standard protocol without intravenous contrast. Multiplanar CT image reconstructions of the cervical spine were also generated. COMPARISON:  Head CT from 06/26/2015 FINDINGS: CT HEAD: Brain: Mild bifrontal superficial atrophy. No acute intracranial hemorrhage, midline shift or edema. No large vascular territory infarct. Chronic small vessel ischemic change of periventricular white matter. No  intra-axial mass. Vascular: Stable aneurysmal dilatation with calcification involving the distal left vertebral artery up to 10-11 mm near the basilar artery junction. No hyperdense vessels. Skull: Previous right posterior frontal craniotomy. Scattered dural calcifications deep to the craniotomy flap. Small anterior falcine lipoma. Sinuses and orbits: Nonacute Soft tissues:  Unremarkable CT CERVICAL SPINE FINDINGS Alignment: The craniocervical relationship is maintained. There is accentuation of cervical lordosis. The atlantodental interval is within normal limits. Skull base and vertebrae: No acute fracture nor bone destruction of the vertebrae. Skull base is unremarkable. Known partially calcified 10-11 mm left vertebral artery aneurysm near the basilar artery junction. Soft tissues and spinal canal: No intraspinal hematoma. No prevertebral soft tissue swelling. Disc levels: Degenerative disc disease C2 through C5 with small posterior marginal osteophytes. Small disc-osteophyte complex at C3-4.Mild to moderate left-sided neural foraminal encroachment from C3 through C5 and mild on the right. Upper chest: Nonacute Other: None IMPRESSION: 1. Chronic partially calcified left vertebral artery aneurysm near its junction with the basilar artery measuring 10-11 mm in diameter. 2. Mild superficial atrophy. No acute intracranial abnormality. Chronic minimal small vessel ischemic disease of periventricular white matter. 3. Status post right posterior frontal craniotomy. 4. Cervical spinal spondylosis without acute fracture. Mild multilevel degenerative disc disease C2 through C5 with bilateral neural foraminal encroachment left greater than right. Electronically Signed   By: Ashley Royalty M.D.   On: 01/08/2017 01:58   Ct Cervical Spine Wo Contrast  Result Date: 01/08/2017 CLINICAL DATA:  Slipped on ice hitting posterior head. Blurry vision. No loss consciousness. EXAM: CT HEAD WITHOUT CONTRAST CT CERVICAL SPINE WITHOUT  CONTRAST TECHNIQUE: Multidetector CT imaging of the head and cervical spine was performed following the standard protocol without intravenous contrast. Multiplanar CT image reconstructions of the cervical spine were also generated. COMPARISON:  Head CT from 06/26/2015 FINDINGS: CT HEAD: Brain: Mild bifrontal superficial atrophy. No acute intracranial hemorrhage, midline shift or edema. No large vascular territory infarct. Chronic small vessel ischemic change of periventricular white matter. No intra-axial mass. Vascular: Stable aneurysmal dilatation with calcification involving the distal left vertebral artery up to 10-11 mm near the  basilar artery junction. No hyperdense vessels. Skull: Previous right posterior frontal craniotomy. Scattered dural calcifications deep to the craniotomy flap. Small anterior falcine lipoma. Sinuses and orbits: Nonacute Soft tissues:  Unremarkable CT CERVICAL SPINE FINDINGS Alignment: The craniocervical relationship is maintained. There is accentuation of cervical lordosis. The atlantodental interval is within normal limits. Skull base and vertebrae: No acute fracture nor bone destruction of the vertebrae. Skull base is unremarkable. Known partially calcified 10-11 mm left vertebral artery aneurysm near the basilar artery junction. Soft tissues and spinal canal: No intraspinal hematoma. No prevertebral soft tissue swelling. Disc levels: Degenerative disc disease C2 through C5 with small posterior marginal osteophytes. Small disc-osteophyte complex at C3-4.Mild to moderate left-sided neural foraminal encroachment from C3 through C5 and mild on the right. Upper chest: Nonacute Other: None IMPRESSION: 1. Chronic partially calcified left vertebral artery aneurysm near its junction with the basilar artery measuring 10-11 mm in diameter. 2. Mild superficial atrophy. No acute intracranial abnormality. Chronic minimal small vessel ischemic disease of periventricular white matter. 3. Status post  right posterior frontal craniotomy. 4. Cervical spinal spondylosis without acute fracture. Mild multilevel degenerative disc disease C2 through C5 with bilateral neural foraminal encroachment left greater than right. Electronically Signed   By: Ashley Royalty M.D.   On: 01/08/2017 01:58   Ct Angio Chest Aorta W &/or Wo Contrast  Result Date: 01/04/2017 CLINICAL DATA:  Follow-up of ascending thoracic aortic aneurysm. EXAM: CT ANGIOGRAPHY CHEST WITH CONTRAST TECHNIQUE: Multidetector CT imaging of the chest was performed using the standard protocol during bolus administration of intravenous contrast. Multiplanar CT image reconstructions and MIPs were obtained to evaluate the vascular anatomy. CONTRAST:  75 mL of Isovue 370 COMPARISON:  January 06, 2016 FINDINGS: Cardiovascular: The thoracic aorta measures 4.3 cm in AP diameter at its root root on series 3, image 59 versus 4.3 cm previously. The thoracic aorta measures 4.5 cm at the level of the right main pulmonary artery, unchanged. The thoracic aorta measures 4.1 cm distally, as it enters the arch on series 3, image 36 versus 4.1 cm previously. The remainder of the thoracic aorta is normal in caliber. No dissection. The vessels off of the thoracic aortic arch are stable. Central pulmonary arteries are unremarkable. The heart size is normal and unchanged. No effusions. Mediastinum/Nodes: No enlarged mediastinal, hilar, or axillary lymph nodes. Thyroid gland, trachea, and esophagus demonstrate no significant findings. Lungs/Pleura: Lungs are clear. No pleural effusion or pneumothorax. 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 There is hepatic steatosis. The upper abdomen is otherwise unremarkable. Musculoskeletal: Degenerative changes seen in the thoracic spine. No other bony abnormalities. Review of the MIP images confirms the above findings. IMPRESSION: 1. Aneurysmal  dilatation of the ascending thoracic aorta measuring up to 4.5 cm, unchanged in the interval. 2. 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 3. No other acute abnormalities. Ascending thoracic aortic aneurysm. Recommend semi-annual imaging followup by CTA or MRA and referral to cardiothoracic surgery if not already obtained. This recommendation follows 2010 ACCF/AHA/AATS/ACR/ASA/SCA/SCAI/SIR/STS/SVM Guidelines for the Diagnosis and Management of Patients With Thoracic Aortic Disease. Circulation. 2010; 121: N989-Q119 Electronically Signed   By: Dorise Bullion III M.D   On: 01/04/2017 14:00   Dg Hip Unilat W Or Wo Pelvis 2-3 Views Right  Result Date: 01/08/2017 CLINICAL DATA:  Patient fell on ice.  Right hip pain. EXAM: DG HIP (WITH OR WITHOUT  PELVIS) 2-3V RIGHT COMPARISON:  None. FINDINGS: The bony pelvis appears intact. Slight axial joint space narrowing of the hips. 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 No acute fracture. Mild osteoarthritic sclerosis of the right SI joint. There is lower lumbar degenerative disc disease. IMPRESSION: No acute osseous abnormality. Right SI joint osteoarthritis. 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 Electronically Signed   By: Ashley Royalty M.D.   On: 01/08/2017 00:11    Procedures Procedures (including critical care time)  Medications Ordered in ED  Medications  ketorolac (TORADOL) injection 60 mg (not administered)     Final Clinical Impressions(s) / ED Diagnoses  Fall: All questions answered to patient's satisfaction. Based on history and exam patient has been appropriately medically screened and emergency conditions excluded. Patient is stable for discharge at this time. Strict  return precautions given for any further episodes, persistent fever, weakness or any concerns. New Prescriptions I personally performed the services described in this documentation, which was scribed in my presence. The recorded information has been reviewed and is accurate.       Veatrice Kells, MD 01/08/17 2669

## 2017-01-08 ENCOUNTER — Emergency Department (HOSPITAL_COMMUNITY): Payer: Medicare Other

## 2017-01-08 ENCOUNTER — Encounter (HOSPITAL_COMMUNITY): Payer: Self-pay | Admitting: Emergency Medicine

## 2017-01-08 DIAGNOSIS — S0990XA Unspecified injury of head, initial encounter: Secondary | ICD-10-CM | POA: Diagnosis not present

## 2017-01-08 MED ORDER — MELOXICAM 7.5 MG PO TABS: 7.5000 mg | ORAL_TABLET | Freq: Every day | ORAL | 0 refills | Status: DC

## 2017-01-08 MED ORDER — KETOROLAC TROMETHAMINE 60 MG/2ML IM SOLN
60.0000 mg | Freq: Once | INTRAMUSCULAR | Status: AC
  Administered 2017-01-08: 60 mg via INTRAMUSCULAR
  Filled 2017-01-08: qty 2

## 2017-01-08 NOTE — ED Notes (Signed)
Patient transported to CT 

## 2017-01-11 DIAGNOSIS — J111 Influenza due to unidentified influenza virus with other respiratory manifestations: Secondary | ICD-10-CM | POA: Diagnosis not present

## 2017-01-11 DIAGNOSIS — R05 Cough: Secondary | ICD-10-CM | POA: Diagnosis not present

## 2017-01-15 DIAGNOSIS — R7301 Impaired fasting glucose: Secondary | ICD-10-CM | POA: Diagnosis not present

## 2017-01-18 ENCOUNTER — Ambulatory Visit: Payer: Medicare Other | Admitting: Cardiovascular Disease

## 2017-01-24 DIAGNOSIS — J22 Unspecified acute lower respiratory infection: Secondary | ICD-10-CM | POA: Diagnosis not present

## 2017-01-24 DIAGNOSIS — R0602 Shortness of breath: Secondary | ICD-10-CM | POA: Diagnosis not present

## 2017-01-25 NOTE — Progress Notes (Signed)
Patient ID: Jose [name], male   DOB: 03/30/1951, 66 y.o.   MRN: [id number]     Cardiology Office Note   Date:  02/07/2017   ID:  [name], DOB 20-Mar-1951, MRN [id number]  PCP:  [name], MD  Cardiologist:   [name], MD   Chief Complaint  Patient presents with  . Aortic valve stenosis, etiology of cardiac valve disease uns      History of Present Illness: Jose [name] is a 66 y.o. male who presents for evaluation of strong family history of CAD  Followed by [name] for ascending aortic dilatations.  Reviewed CTA 12/28/15 and measured stable 4.5 cm  The patient has a history of intracranial bleed/ subdural hematoma without history of trauma treated in [place]. He underwent right craniotomy. July  2016  Patient has 11 mm left vertebral artery aneurysm followed by neurosurgery in [place]  History family history is significant for his father who died in his late 70s with myocardial infarction, after having his first myocardial infarction in his 34s. There is no family history of aortic aneurysms, unexplained sudden death at an early age or aortic dissection.He has one cousin who died in his 60s of sudden collapse thought to be a brain aneurysm  I reviewed his echo from today and he has a bicuspid aortic valve  Mean gradient 17 mmhg and peak 31 mmHg.  Mild to moderate AS  Aortic root mildly dilated  Long discussion with him about diagnosis and how we follow it. He has mild exertional dsypnea no chest pain.  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 Systolic function was normal. The estimated ejection fraction was in the range of 55% to 60%. - Aortic valve:  Bicuspid aortic valve Bicuspid. There was mild stenosis. Valve area (VTI): 1.32 cm^2. Valve area (Vmax): 1.23 cm^2. Valve area (Vmean): 1.15 cm^2. - Atrial septum: No defect or patent foramen ovale was identified.  Was supposed to have myvoue after last visit due to family history and abnormal ECG   Past Medical History:  Diagnosis Date  . SDH (subdural hematoma) (HCC)     Past Surgical History:  Procedure Laterality Date  . CARPAL TUNNEL RELEASE    . SHOULDER SURGERY       Current Outpatient Prescriptions  Medication Sig Dispense Refill  . acetaminophen (TYLENOL) 500 MG tablet Take 500-1,000 mg by mouth every 6 (six) hours as needed for mild pain.    Marland Kitchen allopurinol (ZYLOPRIM) 300 MG tablet Take 300 mg by mouth daily.    . colchicine 0.6 MG tablet Take 1 tablet (0.6 mg total) by mouth daily as needed (FOR GOUT FLAREUPS). 30 tablet 3  . finasteride (PROSCAR) 5 MG tablet TAKE 1 TABLET ORAL DAILY.  3  . Multiple Vitamin (MULTIVITAMIN WITH MINERALS) TABS tablet Take 1 tablet by mouth daily.    . tamsulosin (FLOMAX) 0.4 MG CAPS capsule Take 0.4 mg by mouth daily after breakfast.     No current facility-administered medications for this visit.     Allergies:   Patient has no known allergies.    Social History:  The patient  reports that he has never smoked. He has never used smokeless tobacco. He reports that he drinks about 1.8 oz of alcohol per week . He reports that he does not use drugs.   Family History:  The patient's family history is not on file.    ROS:  Please see the history of present illness.   Otherwise, review of systems are positive for none.   All other systems are reviewed and negative.    PHYSICAL EXAM: VS:  BP 110/80   Pulse 98   Ht 6' (1.829 m)   Wt 203 lb (92.1 kg)   SpO2 98%   BMI 27.53 kg/m  , BMI Body mass index is 27.53 kg/m. Affect appropriate Healthy:  appears stated age 60: normal Neck supple with no adenopathy JVP normal no bruits  no thyromegaly Lungs clear with no wheezing and good diaphragmatic motion Heart:  S1/S2 spplit SEM murmur, no rub, gallop or click PMI normal Abdomen: benighn, BS positve, no tenderness, no AAA no bruit.  No HSM or HJR Distal pulses intact with no bruits No edema Neuro non-focal Skin warm and dry No muscular weakness    EKG:  06/27/15  SR rate 52  Low voltage septal infarct ? Lead placement  02/07/16  SR rate 87  Nonspecific ST changes 02/07/17  SR rate 97 ? Old anterior MI    Recent Labs: 11/22/2016: ALT 29; BUN 18; Creat 1.11; Hemoglobin 15.3; Platelets 204; Potassium 4.4; Sodium 140    Lipid Panel    Component Value Date/Time   CHOL 243 (HH) 12/25/2007 1636   TRIG 126 12/25/2007 1636   HDL 66.4 12/25/2007 1636   CHOLHDL 3.7 CALC 12/25/2007 1636   VLDL 25 12/25/2007 1636   LDLDIRECT 164.4 12/25/2007 1636      Wt Readings from Last 3 Encounters:  02/07/17 203 lb (92.1 kg)  01/07/17 197 lb (89.4 kg)  01/04/17 212 lb (96.2 kg)      Other studies Reviewed: Additional studies/ records that were reviewed today include:  Epic notes CTA, ECG Notes from [name] see HPI.    ASSESSMENT AND PLAN:  1.  Aortic Aneurysm:  Now in context of bicuspid valve will need to be followed closer per [name] 4.5 cm CTA 01/04/17 stable  Start Toprol 25 mg daily  2. Subdural:  Spontaneous avoid antiplatelet agents follow vertebral artery with ? Duplex per neuro 3. Family history CAD.   4. Bicuspid AV:  Long discussion about prognosis and indications for surgery and relation to his previously diagnosed aneurysm.   Mild to moderate AS  F/u echo ordered 5. Family history CAD and abnormal ECG known valve dx f/u exercise myovue   Depending on HR/BP response to exercise will likely start beta blocker to decrease rate of change in aneurysm     Current medicines are reviewed at length with the patient today.  The patient does not have concerns regarding medicines.  The following changes  have been made:  no change  Labs/ tests ordered today include:     Orders Placed This Encounter  Procedures  . EKG 12-Lead     Disposition:   FU with 6 months consider f/u imaging of aorta  Discussed issues and new diagnosis with [name]      Signed, [name], MD  02/07/2017 12:03 PM    [address], [place], [place]  [address] Phone: [phone]; Fax: [phone]

## 2017-01-26 DIAGNOSIS — Z23 Encounter for immunization: Secondary | ICD-10-CM | POA: Diagnosis not present

## 2017-01-26 DIAGNOSIS — H9190 Unspecified hearing loss, unspecified ear: Secondary | ICD-10-CM | POA: Diagnosis not present

## 2017-01-26 DIAGNOSIS — R51 Headache: Secondary | ICD-10-CM | POA: Diagnosis not present

## 2017-01-26 DIAGNOSIS — R972 Elevated prostate specific antigen [PSA]: Secondary | ICD-10-CM | POA: Diagnosis not present

## 2017-01-26 DIAGNOSIS — Z0000 Encounter for general adult medical examination without abnormal findings: Secondary | ICD-10-CM | POA: Diagnosis not present

## 2017-01-26 DIAGNOSIS — M109 Gout, unspecified: Secondary | ICD-10-CM | POA: Diagnosis not present

## 2017-02-02 DIAGNOSIS — F438 Other reactions to severe stress: Secondary | ICD-10-CM | POA: Diagnosis not present

## 2017-02-05 DIAGNOSIS — F438 Other reactions to severe stress: Secondary | ICD-10-CM | POA: Diagnosis not present

## 2017-02-07 ENCOUNTER — Encounter (INDEPENDENT_AMBULATORY_CARE_PROVIDER_SITE_OTHER): Payer: Self-pay | Admitting: Orthopedic Surgery

## 2017-02-07 ENCOUNTER — Ambulatory Visit (INDEPENDENT_AMBULATORY_CARE_PROVIDER_SITE_OTHER): Payer: Medicare Other

## 2017-02-07 ENCOUNTER — Ambulatory Visit (INDEPENDENT_AMBULATORY_CARE_PROVIDER_SITE_OTHER): Payer: Medicare Other | Admitting: Cardiovascular Disease

## 2017-02-07 ENCOUNTER — Encounter: Payer: Self-pay | Admitting: Cardiovascular Disease

## 2017-02-07 ENCOUNTER — Ambulatory Visit (INDEPENDENT_AMBULATORY_CARE_PROVIDER_SITE_OTHER): Payer: Medicare Other | Admitting: Orthopedic Surgery

## 2017-02-07 VITALS — Ht 72.0 in | Wt 197.0 lb

## 2017-02-07 VITALS — BP 110/80 | HR 98 | Ht 72.0 in | Wt 203.0 lb

## 2017-02-07 DIAGNOSIS — M25562 Pain in left knee: Secondary | ICD-10-CM | POA: Diagnosis not present

## 2017-02-07 DIAGNOSIS — R079 Chest pain, unspecified: Secondary | ICD-10-CM

## 2017-02-07 DIAGNOSIS — R0602 Shortness of breath: Secondary | ICD-10-CM | POA: Diagnosis not present

## 2017-02-07 DIAGNOSIS — I350 Nonrheumatic aortic (valve) stenosis: Secondary | ICD-10-CM | POA: Diagnosis not present

## 2017-02-07 MED ORDER — HYDROCODONE-ACETAMINOPHEN 5-325 MG PO TABS: ORAL_TABLET | ORAL | 0 refills | Status: DC

## 2017-02-07 MED ORDER — METOPROLOL SUCCINATE ER 25 MG PO TB24: 25.0000 mg | ORAL_TABLET | Freq: Every day | ORAL | 3 refills | Status: DC

## 2017-02-07 NOTE — Progress Notes (Signed)
Office Visit Note   Patient: Jose [name]           Date of Birth: 26-Feb-1951           MRN: [id number] Visit Date: 02/07/2017 Requested by: [name], MD [address], [place], [place] [address] PCP: [name], MD  Subjective: Chief Complaint  Patient presents with  . Left Knee - Pain    HPI Jose [name] is a 66 year old patient with left knee pain.  He walked up a flight of stairs yesterday and felt a pop.  Develop severe pain suprapatellar region.  He had a cold sweat the pain was so severe.  Start him to sleep last night.  He tried to walk it off but it wasn't really getting any better.  Try taking Tylenol without relief.  He sees Dr. Rosana Hoes for for gout.  He does not believe this is gout.  He denies any mechanical symptoms in the knee.              Review of Systems All systems reviewed are negative as they relate to the chief complaint within the history of present illness.  Patient denies  fevers or chills.    Assessment & Plan: Visit Diagnoses:  1. Acute pain of left knee     Plan: Impression is focal pain suprapatellar region near where the quadriceps attaches to the patella.  This looks like a partial strain at that site with focal swelling.  His extensor mechanism is intact.  Radiographs unremarkable.  No effusion or joint line tenderness in the knee.  Plan is for one time prescription for pain medicine.  Samples of topical anti-inflammatory.  Ace wrap.  3 week return to decide for or against further intervention.  Follow-Up Instructions: No Follow-up on file.   Orders:  Orders Placed This Encounter  Procedures  . XR KNEE 3 VIEW LEFT   Meds ordered this encounter  Medications  . HYDROcodone-acetaminophen (NORCO/VICODIN) 5-325 MG tablet    Sig: 1 po q 8 hours prn severe pain    Dispense:  30 tablet    Refill:  0      Procedures: No procedures performed   Clinical Data: No additional findings.  Objective: Vital Signs: Ht 6' (1.829 m)   Wt  197 lb (89.4 kg)   BMI 26.72 kg/m   Physical Exam   Constitutional: Patient appears well-developed HEENT:  Head: Normocephalic Eyes:EOM are normal Neck: Normal range of motion Cardiovascular: Normal rate Pulmonary/chest: Effort normal Neurologic: Patient is alert Skin: Skin is warm Psychiatric: Patient has normal mood and affect    Ortho Exam examination of the left knee demonstrates full active and passive range of motion.  Extensor mechanism is intact.  No groin pain with internal/external rotation of the left leg.  Pedal pulses palpable.  Collaterals are stable.  There is no effusion and no joint line tenderness.  He does have a little bit of focal swelling at the quadriceps tendon attachment site to the patella  Specialty Comments:  No specialty comments available.  Imaging: Xr Knee 3 View Left  Result Date: 02/07/2017 AP lateral merchant view left knee reviewed.  Joint spaces maintained.  Patellofemoral articulation normal.  No soft tissue calcifications present in the knee.  No loose bodies noted.    PMFS History: Patient Active Problem List   Diagnosis Date Noted  . Acute pain of left knee 02/07/2017  . Idiopathic chronic gout, unspecified site, without tophus (tophi) 11/29/2016  . HEMATURIA  UNSPECIFIED 01/25/2009  . ABDOMINAL PAIN 01/25/2009  . XERODERMA 03/10/2008  . UNS ADVRS EFF UNS RX MEDICINAL&BIOLOGICAL SBSTNC 03/10/2008  . ADJUSTMENT REACTION WITH PHYSICAL SYMPTOMS 02/14/2008  . MUSCLE SPASM 02/14/2008  . ACUTE PROSTATITIS 12/16/2007  . BREAST LUMP OR MASS, RIGHT 07/12/2007  . H/O: RCT (rotator cuff tear) 01/13/2007  . GOUT 01/08/2007   Past Medical History:  Diagnosis Date  . SDH (subdural hematoma) (HCC)     No family history on file.  Past Surgical History:  Procedure Laterality Date  . CARPAL TUNNEL RELEASE    . SHOULDER SURGERY     Social History   Occupational History  . Not on file.   Social History Main Topics  . Smoking status:  Never Smoker  . Smokeless tobacco: Never Used  . Alcohol use 1.8 oz/week    2 Glasses of wine, 1 Shots of liquor per week  . Drug use: No  . Sexual activity: Not on file

## 2017-02-07 NOTE — Patient Instructions (Addendum)
Medication Instructions:  Your physician has recommended you make the following change in your medication:  1-Toprol 25 mg by mouth daily  Labwork: NONE  Testing/Procedures: Your physician has requested that you have an echocardiogram. Echocardiography is a painless test that uses sound waves to create images of your heart. It provides your doctor with information about the size and shape of your heart and how well your heart's chambers and valves are working. This procedure takes approximately one hour. There are no restrictions for this procedure.  Your physician has requested that you have en exercise stress myoview. For further information please visit [email or web address]. Please follow instruction sheet, as given.  Follow-Up: Your physician wants you to follow-up in: 6 months with Dr. Johnsie Cancel. You will receive a reminder letter in the mail two months in advance. If you don't receive a letter, please call our office to schedule the follow-up appointment.   If you need a refill on your cardiac medications before your next appointment, please call your pharmacy.

## 2017-02-08 ENCOUNTER — Telehealth (HOSPITAL_COMMUNITY): Payer: Self-pay | Admitting: *Deleted

## 2017-02-08 NOTE — Telephone Encounter (Signed)
Patient given detailed instructions per Myocardial Perfusion Study Information Sheet for the test on 02/13/17. Patient notified to arrive 15 minutes early and that it is imperative to arrive on time for appointment to keep from having the test rescheduled.  If you need to cancel or reschedule your appointment, please call the office within 24 hours of your appointment. Failure to do so may result in a cancellation of your appointment, and a $50 no show fee. Patient verbalized understanding.Kirstie Peri

## 2017-02-09 DIAGNOSIS — F438 Other reactions to severe stress: Secondary | ICD-10-CM | POA: Diagnosis not present

## 2017-02-12 DIAGNOSIS — F438 Other reactions to severe stress: Secondary | ICD-10-CM | POA: Diagnosis not present

## 2017-02-13 ENCOUNTER — Encounter (HOSPITAL_COMMUNITY): Payer: Medicare Other

## 2017-02-14 DIAGNOSIS — F438 Other reactions to severe stress: Secondary | ICD-10-CM | POA: Diagnosis not present

## 2017-02-19 ENCOUNTER — Telehealth (HOSPITAL_COMMUNITY): Payer: Self-pay | Admitting: *Deleted

## 2017-02-19 DIAGNOSIS — F438 Other reactions to severe stress: Secondary | ICD-10-CM | POA: Diagnosis not present

## 2017-02-19 NOTE — Telephone Encounter (Signed)
Left message on voicemail per DPR in reference to upcoming appointment scheduled on 02/19/17 at 1000.me  with detailed instructions given per Myocardial Perfusion Study Information Sheet for the test. LM to arrive 15 minutes early, and that it is imperative to arrive on time for appointment to keep from having the test rescheduled. If you need to cancel or reschedule your appointment, please call the office within 24 hours of your appointment. Failure to do so may result in a cancellation of your appointment, and a $50 no show fee. Phone number given for call back for any questions.

## 2017-02-21 ENCOUNTER — Ambulatory Visit (HOSPITAL_COMMUNITY): Payer: Medicare Other | Attending: Cardiology

## 2017-02-21 ENCOUNTER — Ambulatory Visit (HOSPITAL_BASED_OUTPATIENT_CLINIC_OR_DEPARTMENT_OTHER): Payer: Medicare Other

## 2017-02-21 ENCOUNTER — Ambulatory Visit (INDEPENDENT_AMBULATORY_CARE_PROVIDER_SITE_OTHER): Payer: Medicare Other | Admitting: Rheumatology

## 2017-02-21 ENCOUNTER — Other Ambulatory Visit: Payer: Self-pay

## 2017-02-21 DIAGNOSIS — R0602 Shortness of breath: Secondary | ICD-10-CM | POA: Diagnosis not present

## 2017-02-21 DIAGNOSIS — I719 Aortic aneurysm of unspecified site, without rupture: Secondary | ICD-10-CM | POA: Insufficient documentation

## 2017-02-21 DIAGNOSIS — Z8249 Family history of ischemic heart disease and other diseases of the circulatory system: Secondary | ICD-10-CM | POA: Insufficient documentation

## 2017-02-21 DIAGNOSIS — M25562 Pain in left knee: Secondary | ICD-10-CM

## 2017-02-21 DIAGNOSIS — I350 Nonrheumatic aortic (valve) stenosis: Secondary | ICD-10-CM | POA: Diagnosis not present

## 2017-02-21 DIAGNOSIS — G8929 Other chronic pain: Secondary | ICD-10-CM | POA: Diagnosis not present

## 2017-02-21 DIAGNOSIS — I110 Hypertensive heart disease with heart failure: Secondary | ICD-10-CM | POA: Insufficient documentation

## 2017-02-21 DIAGNOSIS — Q231 Congenital insufficiency of aortic valve: Secondary | ICD-10-CM | POA: Insufficient documentation

## 2017-02-21 DIAGNOSIS — J45909 Unspecified asthma, uncomplicated: Secondary | ICD-10-CM | POA: Insufficient documentation

## 2017-02-21 DIAGNOSIS — E119 Type 2 diabetes mellitus without complications: Secondary | ICD-10-CM | POA: Diagnosis not present

## 2017-02-21 DIAGNOSIS — I509 Heart failure, unspecified: Secondary | ICD-10-CM | POA: Insufficient documentation

## 2017-02-21 DIAGNOSIS — M170 Bilateral primary osteoarthritis of knee: Secondary | ICD-10-CM

## 2017-02-21 DIAGNOSIS — M25561 Pain in right knee: Principal | ICD-10-CM

## 2017-02-21 DIAGNOSIS — R079 Chest pain, unspecified: Secondary | ICD-10-CM

## 2017-02-21 LAB — MYOCARDIAL PERFUSION IMAGING
CHL CUP NUCLEAR SRS: 4
CHL CUP NUCLEAR SSS: 4
CHL CUP RESTING HR STRESS: 65 {beats}/min
CSEPED: 8 min
CSEPEDS: 0 s
CSEPEW: 10.1 METS
CSEPHR: 94 %
CSEPPHR: 144 {beats}/min
LV dias vol: 81 mL (ref 62–150)
LV sys vol: 43 mL
MPHR: 153 {beats}/min
NUC STRESS TID: 0.82
RATE: 0.33
SDS: 0

## 2017-02-21 LAB — ECHOCARDIOGRAM COMPLETE
Height: 72 in
Weight: 3248 oz

## 2017-02-21 MED ORDER — LIDOCAINE HCL 1 % IJ SOLN
1.5000 mL | INTRAMUSCULAR | Status: AC | PRN
  Administered 2017-02-21: 1.5 mL

## 2017-02-21 MED ORDER — TECHNETIUM TC 99M TETROFOSMIN IV KIT
11.0000 | PACK | Freq: Once | INTRAVENOUS | Status: AC | PRN
  Administered 2017-02-21: 11 via INTRAVENOUS
  Filled 2017-02-21: qty 11

## 2017-02-21 MED ORDER — SODIUM HYALURONATE (VISCOSUP) 20 MG/2ML IX SOSY
20.0000 mg | PREFILLED_SYRINGE | INTRA_ARTICULAR | Status: AC | PRN
  Administered 2017-02-21: 20 mg via INTRA_ARTICULAR

## 2017-02-21 MED ORDER — TECHNETIUM TC 99M TETROFOSMIN IV KIT
33.0000 | PACK | Freq: Once | INTRAVENOUS | Status: AC | PRN
  Administered 2017-02-21: 33 via INTRAVENOUS
  Filled 2017-02-21: qty 33

## 2017-02-21 NOTE — Progress Notes (Signed)
   Procedure Note  Patient: Jose [name]             Date of Birth: 09-21-1951           MRN: [id number]             Visit Date: 02/21/2017  Procedures: Visit Diagnoses: No diagnosis found.  Here for Euflex and #1 bilateral knees Patient did well with Euflex injections in the past and tolerated injections well today  Patient injured his left knee a week ago. He states that as he was climbing stairs, he heard a pop and a severe pain. It was followed by swelling. He followed up with Dr. Colon Branch. Patient states that Dr. Marlou Sa told him that since there was significant swelling that we'll have to wait until the swelling comes down before he can do an evaluation. That evaluation will, 03/07/2017. On today's examination he does not have any instability of the left knee or any swelling. He does have point tenderness to the superior medial aspect of the left knee with no effusions, no warmth, no redness. Patient does want to proceed with Euflex injection and I am agreeable. He also states that he wants to see how well these injections do for him to help him determine if there may be other anomalies to the left knee.  Large Joint Inj Date/Time: 02/21/2017 11:11 AM Performed by: Eliezer Lofts Authorized by: Eliezer Lofts   Consent Given by:  Patient Site marked: the procedure site was marked   Timeout: prior to procedure the correct patient, procedure, and site was verified   Indications:  Pain and joint swelling Location:  Knee Site:  R knee Prep: patient was prepped and draped in usual sterile fashion   Needle Size:  27 G Needle Length:  1.5 inches Approach:  Medial Ultrasound Guidance: No   Fluoroscopic Guidance: No   Arthrogram: No   Medications:  1.5 mL lidocaine 1 %; 20 mg Sodium Hyaluronate 20 MG/2ML Aspiration Attempted: Yes   Patient tolerance:  Patient tolerated the procedure well with no immediate complications  Euflex #1 to  bilateral knees Patient tolerated  procedure well Large Joint Inj Date/Time: 02/21/2017 11:12 AM Performed by: Eliezer Lofts Authorized by: Eliezer Lofts   Consent Given by:  Patient Site marked: the procedure site was marked   Timeout: prior to procedure the correct patient, procedure, and site was verified   Indications:  Pain and joint swelling Location:  Knee Site:  L knee Prep: patient was prepped and draped in usual sterile fashion   Needle Size:  27 G Needle Length:  1.5 inches Approach:  Medial Ultrasound Guidance: No   Fluoroscopic Guidance: No   Arthrogram: No   Medications:  1.5 mL lidocaine 1 %; 20 mg Sodium Hyaluronate 20 MG/2ML Aspiration Attempted: Yes   Patient tolerance:  Patient tolerated the procedure well with no immediate complications  Euflex #1 to  bilateral knees Patient tolerated procedure well  History of left knee injury last week while climbing the stairs when he heard a pop and significant pain for few seconds and no instability please see history of present illness for full details

## 2017-02-22 ENCOUNTER — Telehealth: Payer: Self-pay | Admitting: Cardiovascular Disease

## 2017-02-22 NOTE — Telephone Encounter (Signed)
Patient aware to take his Toprol 25 mg. Per Dr. Johnsie Cancel, the beta blocker is to help prevent his aneurysm from getting larger. Patient verbalized understanding.

## 2017-02-22 NOTE — Telephone Encounter (Signed)
New Message     Please call he is returning Misty's Call

## 2017-02-23 DIAGNOSIS — F438 Other reactions to severe stress: Secondary | ICD-10-CM | POA: Diagnosis not present

## 2017-02-27 ENCOUNTER — Other Ambulatory Visit: Payer: Self-pay | Admitting: *Deleted

## 2017-02-27 DIAGNOSIS — Z79899 Other long term (current) drug therapy: Secondary | ICD-10-CM

## 2017-02-27 DIAGNOSIS — M2550 Pain in unspecified joint: Secondary | ICD-10-CM | POA: Diagnosis not present

## 2017-02-27 LAB — COMPLETE METABOLIC PANEL WITH GFR
ALK PHOS: 52 U/L (ref 40–115)
ALT: 30 U/L (ref 9–46)
AST: 23 U/L (ref 10–35)
Albumin: 4.3 g/dL (ref 3.6–5.1)
BILIRUBIN TOTAL: 0.6 mg/dL (ref 0.2–1.2)
BUN: 16 mg/dL (ref 7–25)
CALCIUM: 9.2 mg/dL (ref 8.6–10.3)
CO2: 26 mmol/L (ref 20–31)
Chloride: 105 mmol/L (ref 98–110)
Creat: 1.28 mg/dL — ABNORMAL HIGH (ref 0.70–1.25)
GFR, EST NON AFRICAN AMERICAN: 58 mL/min — AB (ref >=60)
GFR, Est African American: 67 mL/min (ref >=60)
Glucose, Bld: 93 mg/dL (ref 65–99)
POTASSIUM: 4.3 mmol/L (ref 3.5–5.3)
Sodium: 140 mmol/L (ref 135–146)
TOTAL PROTEIN: 6.5 g/dL (ref 6.1–8.1)

## 2017-02-27 LAB — CBC WITH DIFFERENTIAL/PLATELET
BASOS PCT: 1 %
Basophils Absolute: 77 cells/uL (ref 0–200)
EOS PCT: 1 %
Eosinophils Absolute: 77 cells/uL (ref 15–500)
HCT: 44.8 % (ref 38.5–50.0)
Hemoglobin: 15.2 g/dL (ref 13.2–17.1)
LYMPHS PCT: 22 %
Lymphs Abs: 1694 cells/uL (ref 850–3900)
MCH: 32 pg (ref 27.0–33.0)
MCHC: 33.9 g/dL (ref 32.0–36.0)
MCV: 94.3 fL (ref 80.0–100.0)
MONOS PCT: 8 %
MPV: 11.6 fL (ref 7.5–12.5)
Monocytes Absolute: 616 cells/uL (ref 200–950)
NEUTROS ABS: 5236 {cells}/uL (ref 1500–7800)
Neutrophils Relative %: 68 %
Platelets: 204 10*3/uL (ref 140–400)
RBC: 4.75 MIL/uL (ref 4.20–5.80)
RDW: 14.3 % (ref 11.0–15.0)
WBC: 7.7 10*3/uL (ref 3.8–10.8)

## 2017-02-28 ENCOUNTER — Ambulatory Visit (INDEPENDENT_AMBULATORY_CARE_PROVIDER_SITE_OTHER): Payer: Medicare Other | Admitting: Rheumatology

## 2017-02-28 DIAGNOSIS — M25562 Pain in left knee: Secondary | ICD-10-CM | POA: Diagnosis not present

## 2017-02-28 DIAGNOSIS — M170 Bilateral primary osteoarthritis of knee: Secondary | ICD-10-CM | POA: Diagnosis not present

## 2017-02-28 DIAGNOSIS — E790 Hyperuricemia without signs of inflammatory arthritis and tophaceous disease: Secondary | ICD-10-CM | POA: Insufficient documentation

## 2017-02-28 DIAGNOSIS — N401 Enlarged prostate with lower urinary tract symptoms: Secondary | ICD-10-CM | POA: Diagnosis not present

## 2017-02-28 DIAGNOSIS — M19041 Primary osteoarthritis, right hand: Secondary | ICD-10-CM | POA: Insufficient documentation

## 2017-02-28 DIAGNOSIS — G8929 Other chronic pain: Secondary | ICD-10-CM | POA: Diagnosis not present

## 2017-02-28 DIAGNOSIS — M19042 Primary osteoarthritis, left hand: Secondary | ICD-10-CM

## 2017-02-28 DIAGNOSIS — M25561 Pain in right knee: Secondary | ICD-10-CM

## 2017-02-28 DIAGNOSIS — R972 Elevated prostate specific antigen [PSA]: Secondary | ICD-10-CM | POA: Diagnosis not present

## 2017-02-28 LAB — URIC ACID: URIC ACID, SERUM: 4.8 mg/dL (ref 4.0–8.0)

## 2017-02-28 MED ORDER — LIDOCAINE HCL 1 % IJ SOLN
1.5000 mL | INTRAMUSCULAR | Status: AC | PRN
  Administered 2017-02-28: 1.5 mL

## 2017-02-28 MED ORDER — SODIUM HYALURONATE (VISCOSUP) 20 MG/2ML IX SOSY
20.0000 mg | PREFILLED_SYRINGE | INTRA_ARTICULAR | Status: AC | PRN
  Administered 2017-02-28: 20 mg via INTRA_ARTICULAR

## 2017-02-28 NOTE — Progress Notes (Signed)
Has mild increase in his creatinine. Please advised not use any anti-inflammatories. Repeat labs in 1 month BMP with GFR only

## 2017-02-28 NOTE — Patient Instructions (Signed)
Return to clinic in 1 week for the next injection

## 2017-02-28 NOTE — Progress Notes (Signed)
Office Visit Note  Patient: Jose [name]             Date of Birth: 02-03-1951           MRN: [id number]             PCP: [name], MD Referring: [facility], [facility] Visit Date: 03/01/2017 Occupation: @GUAROCC @    Subjective:  Follow-up   History of Present Illness: Jose [name] is a 66 y.o. male  Follow-up on gout. Patient is doing well. No flare. Taking allopurinol 3 mg daily. Taking colchicine when necessary. He has not any flares in the long time. He is observing a proper gout diet, staying hydrated, using his medications as prescribed.  He has a history of bilateral knee joint osteoarthritis with currently left knee pain.  His most recent labs showed slight abnormality in his kidney function (we will monitor).  His left knee has been hurting him now for about 2 weeks now. We were hopeful that the Euflex injections that we've been giving him address the left knee pain. Unfortunately the second injection last night did not give him any relief. He had bad night last night and was hurting all through the night. As a result he is asking for cortisone injection to the left knee. I am agreeable.   Activities of Daily Living:  Patient reports morning stiffness for 30 minutes.   Patient Reports nocturnal pain.  Difficulty dressing/grooming: Denies Difficulty climbing stairs: Reports Difficulty getting out of chair: Reports Difficulty using hands for taps, buttons, cutlery, and/or writing: Denies   Review of Systems  Constitutional: Negative for fatigue.  HENT: Negative for mouth sores and mouth dryness.   Eyes: Negative for dryness.  Respiratory: Negative for shortness of breath.   Gastrointestinal: Negative for constipation and diarrhea.  Musculoskeletal: Negative for myalgias and myalgias.  Skin: Negative for sensitivity to sunlight.  Neurological: Negative for memory loss.  Psychiatric/Behavioral: Negative for sleep disturbance.    PMFS History:   Patient Active Problem List   Diagnosis Date Noted  . Primary osteoarthritis of both knees 02/28/2017  . Primary osteoarthritis of both hands 02/28/2017  . Uricacidemia 02/28/2017  . Pain in joint of left knee 02/07/2017  . Idiopathic chronic gout, unspecified site, without tophus (tophi) 11/29/2016  . HEMATURIA UNSPECIFIED 01/25/2009  . ABDOMINAL PAIN 01/25/2009  . XERODERMA 03/10/2008  . UNS ADVRS EFF UNS RX MEDICINAL&BIOLOGICAL SBSTNC 03/10/2008  . ADJUSTMENT REACTION WITH PHYSICAL SYMPTOMS 02/14/2008  . MUSCLE SPASM 02/14/2008  . ACUTE PROSTATITIS 12/16/2007  . BREAST LUMP OR MASS, RIGHT 07/12/2007  . H/O: RCT (rotator cuff tear) 01/13/2007  . GOUT 01/08/2007    Past Medical History:  Diagnosis Date  . SDH (subdural hematoma) (HCC)     No family history on file. Past Surgical History:  Procedure Laterality Date  . CARPAL TUNNEL RELEASE    . SHOULDER SURGERY     Social History   Social History Narrative  . No narrative on file     Objective: Vital Signs: BP 120/80   Pulse 78   Resp 16   Ht 6' (1.829 m)   Wt 206 lb (93.4 kg)   BMI 27.94 kg/m    Physical Exam  Constitutional: He is oriented to person, place, and time. He appears well-developed and well-nourished.  HENT:  Head: Normocephalic and atraumatic.  Eyes: Conjunctivae and EOM are normal. Pupils are equal, round, and reactive to light.  Neck: Normal range of motion. Neck supple.  Cardiovascular: Normal rate, regular rhythm and normal heart sounds.  Exam reveals no gallop and no friction rub.   No murmur heard. Pulmonary/Chest: Effort normal and breath sounds normal. No respiratory distress. He has no wheezes. He has no rales. He exhibits no tenderness.  Abdominal: Soft. He exhibits no distension and no mass. There is no tenderness. There is no guarding.  Musculoskeletal: Normal range of motion.  Lymphadenopathy:    He has no cervical adenopathy.  Neurological: He is alert and oriented to person,  place, and time. He exhibits normal muscle tone. Coordination normal.  Skin: Skin is warm and dry. Capillary refill takes less than 2 seconds. No rash noted.  Psychiatric: He has a normal mood and affect. His behavior is normal. Judgment and thought content normal.  Nursing note and vitals reviewed.    Musculoskeletal Exam:  Full range of motion of all joints Grip strength is equal and strong bilaterally Fibromyalgia tender points are all absent  CDAI Exam: No CDAI exam completed.    Investigation: Findings:  Lasix Euflex injection to both knees was December 2016   Orders Only on 02/27/2017  Component Date Value Ref Range Status  . WBC 02/27/2017 7.7  3.8 - 10.8 K/uL Final  . RBC 02/27/2017 4.75  4.20 - 5.80 MIL/uL Final  . Hemoglobin 02/27/2017 15.2  13.2 - 17.1 g/dL Final  . HCT 02/27/2017 44.8  38.5 - 50.0 % Final  . MCV 02/27/2017 94.3  80.0 - 100.0 fL Final  . MCH 02/27/2017 32.0  27.0 - 33.0 pg Final  . MCHC 02/27/2017 33.9  32.0 - 36.0 g/dL Final  . RDW 02/27/2017 14.3  11.0 - 15.0 % Final  . Platelets 02/27/2017 204  140 - 400 K/uL Final  . MPV 02/27/2017 11.6  7.5 - 12.5 fL Final  . Neutro Abs 02/27/2017 5236  1,500 - 7,800 cells/uL Final  . Lymphs Abs 02/27/2017 1694  850 - 3,900 cells/uL Final  . Monocytes Absolute 02/27/2017 616  200 - 950 cells/uL Final  . Eosinophils Absolute 02/27/2017 77  15 - 500 cells/uL Final  . Basophils Absolute 02/27/2017 77  0 - 200 cells/uL Final  . Neutrophils Relative % 02/27/2017 68  % Final  . Lymphocytes Relative 02/27/2017 22  % Final  . Monocytes Relative 02/27/2017 8  % Final  . Eosinophils Relative 02/27/2017 1  % Final  . Basophils Relative 02/27/2017 1  % Final  . Smear Review 02/27/2017 Criteria for review not met   Final  . Sodium 02/27/2017 140  135 - 146 mmol/L Final  . Potassium 02/27/2017 4.3  3.5 - 5.3 mmol/L Final  . Chloride 02/27/2017 105  98 - 110 mmol/L Final  . CO2 02/27/2017 26  20 - 31 mmol/L Final  .  Glucose, Bld 02/27/2017 93  65 - 99 mg/dL Final  . BUN 02/27/2017 16  7 - 25 mg/dL Final  . Creat 02/27/2017 1.28* 0.70 - 1.25 mg/dL Final   Comment:   For patients > or = 68 years of age: The upper reference limit for Creatinine is approximately 13% higher for people identified as African-American.     . Total Bilirubin 02/27/2017 0.6  0.2 - 1.2 mg/dL Final  . Alkaline Phosphatase 02/27/2017 52  40 - 115 U/L Final  . AST 02/27/2017 23  10 - 35 U/L Final  . ALT 02/27/2017 30  9 - 46 U/L Final  . Total Protein 02/27/2017 6.5  6.1 - 8.1 g/dL Final  .  Albumin 02/27/2017 4.3  3.6 - 5.1 g/dL Final  . Calcium 02/27/2017 9.2  8.6 - 10.3 mg/dL Final  . GFR, Est African American 02/27/2017 67  >=60 mL/min Final  . GFR, Est Non African American 02/27/2017 58* >=60 mL/min Final  . Uric Acid, Serum 02/27/2017 4.8  4.0 - 8.0 mg/dL Final  Appointment on 02/21/2017  Component Date Value Ref Range Status  . Weight 02/21/2017 3248  oz Final  . Height 02/21/2017 72.000  in Final  Appointment on 02/21/2017  Component Date Value Ref Range Status  . Rest HR 02/21/2017 65  bpm Final  . Rest BP 02/21/2017 131/99  mmHg Final  . Exercise duration (sec) 02/21/2017 0  sec Final  . Percent HR 02/21/2017 94  % Final  . Exercise duration (min) 02/21/2017 8  min Final  . Estimated workload 02/21/2017 10.1  METS Final  . Peak HR 02/21/2017 144  bpm Final  . Peak BP 02/21/2017 169/98  mmHg Final  . MPHR 02/21/2017 153  bpm Final  . SSS 02/21/2017 4   Final  . SRS 02/21/2017 4   Final  . SDS 02/21/2017 0   Final  . LHR 02/21/2017 0.33   Final  . TID 02/21/2017 0.82   Final  . LV sys vol 02/21/2017 43  mL Final  . LV dias vol 02/21/2017 81  62 - 150 mL Final     Orders Only on 02/27/2017  Component Date Value Ref Range Status  . WBC 02/27/2017 7.7  3.8 - 10.8 K/uL Final  . RBC 02/27/2017 4.75  4.20 - 5.80 MIL/uL Final  . Hemoglobin 02/27/2017 15.2  13.2 - 17.1 g/dL Final  . HCT 02/27/2017 44.8  38.5 -  50.0 % Final  . MCV 02/27/2017 94.3  80.0 - 100.0 fL Final  . MCH 02/27/2017 32.0  27.0 - 33.0 pg Final  . MCHC 02/27/2017 33.9  32.0 - 36.0 g/dL Final  . RDW 02/27/2017 14.3  11.0 - 15.0 % Final  . Platelets 02/27/2017 204  140 - 400 K/uL Final  . MPV 02/27/2017 11.6  7.5 - 12.5 fL Final  . Neutro Abs 02/27/2017 5236  1,500 - 7,800 cells/uL Final  . Lymphs Abs 02/27/2017 1694  850 - 3,900 cells/uL Final  . Monocytes Absolute 02/27/2017 616  200 - 950 cells/uL Final  . Eosinophils Absolute 02/27/2017 77  15 - 500 cells/uL Final  . Basophils Absolute 02/27/2017 77  0 - 200 cells/uL Final  . Neutrophils Relative % 02/27/2017 68  % Final  . Lymphocytes Relative 02/27/2017 22  % Final  . Monocytes Relative 02/27/2017 8  % Final  . Eosinophils Relative 02/27/2017 1  % Final  . Basophils Relative 02/27/2017 1  % Final  . Smear Review 02/27/2017 Criteria for review not met   Final  . Sodium 02/27/2017 140  135 - 146 mmol/L Final  . Potassium 02/27/2017 4.3  3.5 - 5.3 mmol/L Final  . Chloride 02/27/2017 105  98 - 110 mmol/L Final  . CO2 02/27/2017 26  20 - 31 mmol/L Final  . Glucose, Bld 02/27/2017 93  65 - 99 mg/dL Final  . BUN 02/27/2017 16  7 - 25 mg/dL Final  . Creat 02/27/2017 1.28* 0.70 - 1.25 mg/dL Final   Comment:   For patients > or = 68 years of age: The upper reference limit for Creatinine is approximately 13% higher for people identified as African-American.     . Total Bilirubin 02/27/2017 0.6  0.2 - 1.2 mg/dL Final  . Alkaline Phosphatase 02/27/2017 52  40 - 115 U/L Final  . AST 02/27/2017 23  10 - 35 U/L Final  . ALT 02/27/2017 30  9 - 46 U/L Final  . Total Protein 02/27/2017 6.5  6.1 - 8.1 g/dL Final  . Albumin 02/27/2017 4.3  3.6 - 5.1 g/dL Final  . Calcium 02/27/2017 9.2  8.6 - 10.3 mg/dL Final  . GFR, Est African American 02/27/2017 67  >=60 mL/min Final  . GFR, Est Non African American 02/27/2017 58* >=60 mL/min Final  . Uric Acid, Serum 02/27/2017 4.8  4.0 - 8.0  mg/dL Final  Appointment on 02/21/2017  Component Date Value Ref Range Status  . Weight 02/21/2017 3248  oz Final  . Height 02/21/2017 72.000  in Final  Appointment on 02/21/2017  Component Date Value Ref Range Status  . Rest HR 02/21/2017 65  bpm Final  . Rest BP 02/21/2017 131/99  mmHg Final  . Exercise duration (sec) 02/21/2017 0  sec Final  . Percent HR 02/21/2017 94  % Final  . Exercise duration (min) 02/21/2017 8  min Final  . Estimated workload 02/21/2017 10.1  METS Final  . Peak HR 02/21/2017 144  bpm Final  . Peak BP 02/21/2017 169/98  mmHg Final  . MPHR 02/21/2017 153  bpm Final  . SSS 02/21/2017 4   Final  . SRS 02/21/2017 4   Final  . SDS 02/21/2017 0   Final  . LHR 02/21/2017 0.33   Final  . TID 02/21/2017 0.82   Final  . LV sys vol 02/21/2017 43  mL Final  . LV dias vol 02/21/2017 81  62 - 150 mL Final  Lab on 11/22/2016  Component Date Value Ref Range Status  . Uric Acid, Serum 11/22/2016 7.9  4.0 - 8.0 mg/dL Final  . WBC 11/22/2016 6.2  3.8 - 10.8 K/uL Final  . RBC 11/22/2016 4.75  4.20 - 5.80 MIL/uL Final  . Hemoglobin 11/22/2016 15.3  13.2 - 17.1 g/dL Final  . HCT 11/22/2016 44.8  38.5 - 50.0 % Final  . MCV 11/22/2016 94.3  80.0 - 100.0 fL Final  . MCH 11/22/2016 32.2  27.0 - 33.0 pg Final  . MCHC 11/22/2016 34.2  32.0 - 36.0 g/dL Final  . RDW 11/22/2016 14.0  11.0 - 15.0 % Final  . Platelets 11/22/2016 204  140 - 400 K/uL Final  . MPV 11/22/2016 11.5  7.5 - 12.5 fL Final  . Neutro Abs 11/22/2016 4278  1,500 - 7,800 cells/uL Final  . Lymphs Abs 11/22/2016 1178  850 - 3,900 cells/uL Final  . Monocytes Absolute 11/22/2016 620  200 - 950 cells/uL Final  . Eosinophils Absolute 11/22/2016 124  15 - 500 cells/uL Final  . Basophils Absolute 11/22/2016 0  0 - 200 cells/uL Final  . Neutrophils Relative % 11/22/2016 69  % Final  . Lymphocytes Relative 11/22/2016 19  % Final  . Monocytes Relative 11/22/2016 10  % Final  . Eosinophils Relative 11/22/2016 2  % Final    . Basophils Relative 11/22/2016 0  % Final  . Smear Review 11/22/2016 Criteria for review not met   Final  . Sodium 11/22/2016 140  135 - 146 mmol/L Final  . Potassium 11/22/2016 4.4  3.5 - 5.3 mmol/L Final  . Chloride 11/22/2016 105  98 - 110 mmol/L Final  . CO2 11/22/2016 27  20 - 31 mmol/L Final  . Glucose, Bld 11/22/2016 102* 65 -  99 mg/dL Final  . BUN 11/22/2016 18  7 - 25 mg/dL Final  . Creat 11/22/2016 1.11  0.70 - 1.25 mg/dL Final   Comment:   For patients > or = 68 years of age: The upper reference limit for Creatinine is approximately 13% higher for people identified as African-American.     . Total Bilirubin 11/22/2016 0.7  0.2 - 1.2 mg/dL Final  . Alkaline Phosphatase 11/22/2016 53  40 - 115 U/L Final  . AST 11/22/2016 23  10 - 35 U/L Final  . ALT 11/22/2016 29  9 - 46 U/L Final  . Total Protein 11/22/2016 6.5  6.1 - 8.1 g/dL Final  . Albumin 11/22/2016 4.2  3.6 - 5.1 g/dL Final  . Calcium 11/22/2016 9.3  8.6 - 10.3 mg/dL Final  . GFR, Est African American 11/22/2016 79  >=60 mL/min Final  . GFR, Est Non African American 11/22/2016 68  >=60 mL/min Final      Imaging: Xr Knee 3 View Left  Result Date: 02/07/2017 AP lateral merchant view left knee reviewed.  Joint spaces maintained.  Patellofemoral articulation normal.  No soft tissue calcifications present in the knee.  No loose bodies noted.   Speciality Comments: No specialty comments available.    Procedures:  Large Joint Inj Date/Time: 03/01/2017 2:35 PM Performed by: Eliezer Lofts Authorized by: Eliezer Lofts   Consent Given by:  Patient Site marked: the procedure site was marked   Timeout: prior to procedure the correct patient, procedure, and site was verified   Indications:  Pain and joint swelling Location:  Knee Site:  L knee Prep: patient was prepped and draped in usual sterile fashion   Needle Size:  27 G Needle Length:  1.5 inches Approach:  Medial Ultrasound Guidance: No    Fluoroscopic Guidance: No   Arthrogram: No   Medications:  1.5 mL lidocaine 1 %; 40 mg triamcinolone acetonide 40 MG/ML Aspiration Attempted: Yes   Patient tolerance:  Patient tolerated the procedure well with no immediate complications  Status post Visco #2 to the left knee (Euflex or). Prior to starting the Euflex couple weeks ago, patient was having left knee joint pain. We are hopeful that it would improve with the Visco supplementation but it hasn't. He received a Euflex injection yesterday and it "hurt like a toothache all night long last night". I will give him a cortisone injection today to help with any inflammation that may be causing his discomfort   Allergies: Patient has no known allergies.   Assessment / Plan:     Visit Diagnoses: Primary osteoarthritis of both knees  Idiopathic chronic gout of multiple sites without tophus  H/O: RCT (rotator cuff tear)  Primary osteoarthritis of both hands  Uricacidemia  Pain in joint of left knee - Plan: Large Joint Injection/Arthrocentesis   Plan: #1: Gout. No flare. Patient is taking allopurinol 3 mg daily. Currently he uric acid is in the desirable range of 4.8. This blood draw was on 02/28/2017 Patient uses [facility] when necessary.  #2: Left knee joint pain. History of osteoarthritis. Pain is been going on for the last 2 weeks now. Despite a second Euflex injection yesterday, patient had ongoing pain to the left knee. After informed consent was obtained the left knee was injected with 40 mg of Kenalog mixed with one half mL's 1% lidocaine. Patient tolerated procedure well. There is no complication  #3: Bilateral knee OA  #4: Mild increasing creatinine and decrease in GFR. We will monitor.  #  5: We'll see the patient back in 6 months and our hope is that if he continues to do well, we will bring him back in 8 months.  Orders: Orders Placed This Encounter  Procedures  . Large Joint Injection/Arthrocentesis   No  orders of the defined types were placed in this encounter.   Face-to-face time spent with patient was 30 minutes. 50% of time was spent in counseling and coordination of care.  Follow-Up Instructions: Return in about 6 months (around 09/01/2017) for Gout, bilateral oa kj, left knee pain, abnl creatinine and gfr ( will monitor).   Eliezer Lofts, PA-C  Note - This record has been created using [employer].  Chart creation errors have been sought, but may not always  have been located. Such creation errors do not reflect on  the standard of medical care.

## 2017-02-28 NOTE — Progress Notes (Signed)
   Procedure Note  Patient: Jose [name]             Date of Birth: [birth year]           MRN: [id number]             Visit Date: 02/28/2017  Procedures: Visit Diagnoses: Primary osteoarthritis of both knees  Bilateral chronic knee pain  Large Joint Inj Date/Time: 02/28/2017 10:08 AM Performed by: Eliezer Lofts Authorized by: Eliezer Lofts   Consent Given by:  Patient Site marked: the procedure site was marked   Timeout: prior to procedure the correct patient, procedure, and site was verified   Indications:  Pain and joint swelling Location:  Knee Site:  R knee Prep: patient was prepped and draped in usual sterile fashion   Needle Size:  27 G Needle Length:  1.5 inches Approach:  Medial Ultrasound Guidance: No   Fluoroscopic Guidance: No   Arthrogram: No   Medications:  1.5 mL lidocaine 1 %; 20 mg Sodium Hyaluronate 20 MG/2ML Aspiration Attempted: Yes   Patient tolerance:  Patient tolerated the procedure well with no immediate complications Large Joint Inj Date/Time: 02/28/2017 10:08 AM Performed by: Eliezer Lofts Authorized by: Eliezer Lofts   Consent Given by:  Patient Site marked: the procedure site was marked   Timeout: prior to procedure the correct patient, procedure, and site was verified   Indications:  Pain and joint swelling Location:  Knee Site:  L knee Prep: patient was prepped and draped in usual sterile fashion   Needle Size:  27 G Needle Length:  1.5 inches Approach:  Medial Ultrasound Guidance: No   Fluoroscopic Guidance: No   Arthrogram: No   Medications:  20 mg Sodium Hyaluronate 20 MG/2ML; 1.5 mL lidocaine 1 % Aspiration Attempted: Yes   Patient tolerance:  Patient tolerated the procedure well with no immediate complications  Euflex #2 bilateral knees Patient tolerating procedure well  Return to clinic in 1 week for Euflex and #3 to both knees  Buy an Bill    Note: Patient has no pain in the right knee. He is really doing well  with Euflex injection. The left knee was painful prior to getting his first injection. The pain has now gone from being general pain to the left knee to a more localized pain to the left knee but has had fair amount of improvement. He's happy with the results. We will see what the second injection does for the patient. If the third injection does not give him the good relief, I have suggested to the patient that the next series that we give we can consider doing Hyalgan injections. Patient will keep that in mind and let us know so that we can document properly and we can ask his insurance company to approve Hyalgan in the future. Note that the left knee has not suffered any trauma/injury prior to his Euflex injection.

## 2017-03-01 ENCOUNTER — Ambulatory Visit (INDEPENDENT_AMBULATORY_CARE_PROVIDER_SITE_OTHER): Payer: Medicare Other | Admitting: Rheumatology

## 2017-03-01 ENCOUNTER — Encounter: Payer: Self-pay | Admitting: Rheumatology

## 2017-03-01 VITALS — BP 120/80 | HR 78 | Resp 16 | Ht 72.0 in | Wt 206.0 lb

## 2017-03-01 DIAGNOSIS — E790 Hyperuricemia without signs of inflammatory arthritis and tophaceous disease: Secondary | ICD-10-CM | POA: Diagnosis not present

## 2017-03-01 DIAGNOSIS — Z8739 Personal history of other diseases of the musculoskeletal system and connective tissue: Secondary | ICD-10-CM

## 2017-03-01 DIAGNOSIS — M170 Bilateral primary osteoarthritis of knee: Secondary | ICD-10-CM | POA: Diagnosis not present

## 2017-03-01 DIAGNOSIS — M19042 Primary osteoarthritis, left hand: Secondary | ICD-10-CM | POA: Diagnosis not present

## 2017-03-01 DIAGNOSIS — M19041 Primary osteoarthritis, right hand: Secondary | ICD-10-CM

## 2017-03-01 DIAGNOSIS — M25562 Pain in left knee: Secondary | ICD-10-CM

## 2017-03-01 DIAGNOSIS — M1A09X0 Idiopathic chronic gout, multiple sites, without tophus (tophi): Secondary | ICD-10-CM | POA: Diagnosis not present

## 2017-03-01 MED ORDER — TRIAMCINOLONE ACETONIDE 40 MG/ML IJ SUSP
40.0000 mg | INTRAMUSCULAR | Status: AC | PRN
  Administered 2017-03-01: 40 mg via INTRA_ARTICULAR

## 2017-03-01 MED ORDER — LIDOCAINE HCL 1 % IJ SOLN
1.5000 mL | INTRAMUSCULAR | Status: AC | PRN
  Administered 2017-03-01: 1.5 mL

## 2017-03-02 DIAGNOSIS — F438 Other reactions to severe stress: Secondary | ICD-10-CM | POA: Diagnosis not present

## 2017-03-07 ENCOUNTER — Ambulatory Visit (INDEPENDENT_AMBULATORY_CARE_PROVIDER_SITE_OTHER): Payer: Medicare Other | Admitting: Orthopedic Surgery

## 2017-03-07 ENCOUNTER — Encounter (INDEPENDENT_AMBULATORY_CARE_PROVIDER_SITE_OTHER): Payer: Self-pay | Admitting: Orthopedic Surgery

## 2017-03-07 ENCOUNTER — Ambulatory Visit (INDEPENDENT_AMBULATORY_CARE_PROVIDER_SITE_OTHER): Payer: Medicare Other | Admitting: Rheumatology

## 2017-03-07 DIAGNOSIS — M25562 Pain in left knee: Secondary | ICD-10-CM

## 2017-03-07 DIAGNOSIS — M170 Bilateral primary osteoarthritis of knee: Secondary | ICD-10-CM | POA: Diagnosis not present

## 2017-03-07 DIAGNOSIS — N401 Enlarged prostate with lower urinary tract symptoms: Secondary | ICD-10-CM | POA: Diagnosis not present

## 2017-03-07 DIAGNOSIS — R972 Elevated prostate specific antigen [PSA]: Secondary | ICD-10-CM | POA: Diagnosis not present

## 2017-03-07 MED ORDER — SODIUM HYALURONATE (VISCOSUP) 20 MG/2ML IX SOSY
20.0000 mg | PREFILLED_SYRINGE | INTRA_ARTICULAR | Status: AC | PRN
  Administered 2017-03-07: 20 mg via INTRA_ARTICULAR

## 2017-03-07 MED ORDER — LIDOCAINE HCL 1 % IJ SOLN
1.5000 mL | INTRAMUSCULAR | Status: AC | PRN
  Administered 2017-03-07: 1.5 mL

## 2017-03-07 NOTE — Progress Notes (Signed)
   Procedure Note  Patient: Jose [name]             Date of Birth: September 13, 1951           MRN: [id number]             Visit Date: 03/07/2017  Procedures: Visit Diagnoses: No diagnosis found.  Large Joint Inj Date/Time: 03/07/2017 9:21 AM Performed by: Eliezer Lofts Authorized by: Eliezer Lofts   Consent Given by:  Patient Site marked: the procedure site was marked   Timeout: prior to procedure the correct patient, procedure, and site was verified   Indications:  Pain and joint swelling Location:  Knee Site:  R knee Prep: patient was prepped and draped in usual sterile fashion   Needle Size:  27 G Needle Length:  1.5 inches Approach:  Medial Ultrasound Guidance: No   Fluoroscopic Guidance: No   Arthrogram: No   Medications:  20 mg Sodium Hyaluronate 20 MG/2ML; 1.5 mL lidocaine 1 % Aspiration Attempted: Yes   Patient tolerance:  Patient tolerated the procedure well with no immediate complications  Patient has done well with the Euflex injections in the past. Today he comes in for his third and final injection. He seen good results. His right knee has done's very well in the past and it is doing very well now and it usually causes a very little problems.  Patient has had good results with the Euflex and the left knee but he had some pain prior to his first injection which we thought would get better after the first Euflex injection. He did not get better. After We gave him the second Euflex injection, the next day we gave him a cortisone shot which was very effective for the patient. Today he comes in for his third and final Euflex injection and he continues to do well with his left knee.  I advised the patient that in 6 months from today, we can restart his Euflexxa series if there is a need or we can wait a little bit until his knee started bothering him. Patient understands and is agreeable  Large Joint Inj Date/Time: 03/07/2017 9:28 AM Performed by: Eliezer Lofts Authorized by: Eliezer Lofts   Consent Given by:  Patient Site marked: the procedure site was marked   Timeout: prior to procedure the correct patient, procedure, and site was verified   Indications:  Pain and joint swelling Location:  Knee Site:  L knee Prep: patient was prepped and draped in usual sterile fashion   Needle Size:  27 G Needle Length:  1.5 inches Approach:  Medial Ultrasound Guidance: No   Fluoroscopic Guidance: No   Arthrogram: No   Medications:  1.5 mL lidocaine 1 %; 20 mg Sodium Hyaluronate 20 MG/2ML Aspiration Attempted: Yes   Patient tolerance:  Patient tolerated the procedure well with no immediate complications

## 2017-03-07 NOTE — Progress Notes (Signed)
Office Visit Note   Patient: Jose [name]           Date of Birth: July 07, 1951           MRN: [id number] Visit Date: 03/07/2017 Requested by: [name], MD [address] [place], [place] [address] PCP: [name], MD  Subjective: Chief Complaint  Patient presents with  . Left Knee - Follow-up   signe  HPI: take care seen in 3 weeks Jose [name] is a 66 year old patient with left knee pain.  Date of injury to 2018.  Here for recheck.  Tried to flex the injection with Dr. Burman Nieves.  After the second injection in the is very painful.  They gave him a cortisone injection with good relief from that.  He is current taking Tylenol for pain.  He localizes the pain to the quad insertional region.  Pain is much better now than it was.  Initially was level VIII out of 10 now for level II out of 10.  Pain does not wake him from sleep he denies any swelling no activity restriction.  He is using a topical for the problem.  Ace wrap helps.  He is able to do a stress test.  He was to do an exercise plan and has walked for an elliptical and weights.              ROS: All systems reviewed are negative as they relate to the chief complaint within the history of present illness.  Patient denies  fevers or chills.   Assessment & Plan: Visit Diagnoses:  1. Pain in joint of left knee   2. Primary osteoarthritis of both knees     Plan: impression is left knee pain improved from where it was after his injury to 2018.  No effusion or mechanical symptoms today.  No real restrictions.  I would say that option for Johm if he has recurrent symptoms would be a different type of gel injection besides the you flex it.  Also consider repeating the cortisone injection if needed.  I will see him back as needed  Follow-Up Instructions: Return if symptoms worsen or fail to improve.   Orders:  No orders of the defined types were placed in this encounter.  No orders of the defined types were placed in  this encounter.     Procedures: No procedures performed   Clinical Data: No additional findings.  Objective: Vital Signs: There were no vitals taken for this visit.  Physical Exam:   Constitutional: Patient appears well-developed HEENT:  Head: Normocephalic Eyes:EOM are normal Neck: Normal range of motion Cardiovascular: Normal rate Pulmonary/chest: Effort normal Neurologic: Patient is alert Skin: Skin is warm Psychiatric: Patient has normal mood and affect    Ortho Exam: examination of the left knee demonstrates full active and passive range of motion no effusion stable collateral crucial ligaments palpable pedal pulses no other masses lymph adenopathy or skin changes in the left knee region no focal joint line tenderness is present  Specialty Comments:  No specialty comments available.  Imaging: No results found.   PMFS History: Patient Active Problem List   Diagnosis Date Noted  . Primary osteoarthritis of both knees 02/28/2017  . Primary osteoarthritis of both hands 02/28/2017  . Uricacidemia 02/28/2017  . Pain in joint of left knee 02/07/2017  . Idiopathic chronic gout, unspecified site, without tophus (tophi) 11/29/2016  . HEMATURIA UNSPECIFIED 01/25/2009  . ABDOMINAL PAIN 01/25/2009  . XERODERMA 03/10/2008  .  UNS ADVRS EFF UNS RX MEDICINAL&BIOLOGICAL SBSTNC 03/10/2008  . ADJUSTMENT REACTION WITH PHYSICAL SYMPTOMS 02/14/2008  . MUSCLE SPASM 02/14/2008  . ACUTE PROSTATITIS 12/16/2007  . BREAST LUMP OR MASS, RIGHT 07/12/2007  . H/O: RCT (rotator cuff tear) 01/13/2007  . GOUT 01/08/2007   Past Medical History:  Diagnosis Date  . SDH (subdural hematoma) (HCC)     No family history on file.  Past Surgical History:  Procedure Laterality Date  . CARPAL TUNNEL RELEASE    . SHOULDER SURGERY     Social History   Occupational History  . Not on file.   Social History Main Topics  . Smoking status: Never Smoker  . Smokeless tobacco: Never Used  .  Alcohol use 1.8 oz/week    2 Glasses of wine, 1 Shots of liquor per week  . Drug use: No  . Sexual activity: Not on file

## 2017-03-08 DIAGNOSIS — F438 Other reactions to severe stress: Secondary | ICD-10-CM | POA: Diagnosis not present

## 2017-03-14 DIAGNOSIS — H903 Sensorineural hearing loss, bilateral: Secondary | ICD-10-CM | POA: Diagnosis not present

## 2017-03-14 DIAGNOSIS — F438 Other reactions to severe stress: Secondary | ICD-10-CM | POA: Diagnosis not present

## 2017-03-15 DIAGNOSIS — R51 Headache: Secondary | ICD-10-CM | POA: Diagnosis not present

## 2017-03-15 DIAGNOSIS — Z8679 Personal history of other diseases of the circulatory system: Secondary | ICD-10-CM | POA: Diagnosis not present

## 2017-03-15 DIAGNOSIS — G4485 Primary stabbing headache: Secondary | ICD-10-CM | POA: Diagnosis not present

## 2017-03-16 DIAGNOSIS — F438 Other reactions to severe stress: Secondary | ICD-10-CM | POA: Diagnosis not present

## 2017-03-19 DIAGNOSIS — F438 Other reactions to severe stress: Secondary | ICD-10-CM | POA: Diagnosis not present

## 2017-03-21 DIAGNOSIS — L814 Other melanin hyperpigmentation: Secondary | ICD-10-CM | POA: Diagnosis not present

## 2017-03-21 DIAGNOSIS — L570 Actinic keratosis: Secondary | ICD-10-CM | POA: Diagnosis not present

## 2017-03-21 DIAGNOSIS — L820 Inflamed seborrheic keratosis: Secondary | ICD-10-CM | POA: Diagnosis not present

## 2017-03-21 DIAGNOSIS — D225 Melanocytic nevi of trunk: Secondary | ICD-10-CM | POA: Diagnosis not present

## 2017-03-21 DIAGNOSIS — C44519 Basal cell carcinoma of skin of other part of trunk: Secondary | ICD-10-CM | POA: Diagnosis not present

## 2017-03-21 DIAGNOSIS — L219 Seborrheic dermatitis, unspecified: Secondary | ICD-10-CM | POA: Diagnosis not present

## 2017-03-21 DIAGNOSIS — D485 Neoplasm of uncertain behavior of skin: Secondary | ICD-10-CM | POA: Diagnosis not present

## 2017-03-23 ENCOUNTER — Ambulatory Visit (HOSPITAL_COMMUNITY)
Admission: EM | Admit: 2017-03-23 | Discharge: 2017-03-23 | Disposition: A | Discharge status: Home / Self Care | Payer: Medicare Other | Attending: Internal Medicine | Admitting: Internal Medicine

## 2017-03-23 ENCOUNTER — Encounter (HOSPITAL_COMMUNITY): Payer: Self-pay | Admitting: Emergency Medicine

## 2017-03-23 DIAGNOSIS — M25562 Pain in left knee: Secondary | ICD-10-CM | POA: Diagnosis not present

## 2017-03-23 DIAGNOSIS — H5711 Ocular pain, right eye: Secondary | ICD-10-CM

## 2017-03-23 MED ORDER — MELOXICAM 15 MG PO TABS: 15.0000 mg | ORAL_TABLET | Freq: Every day | ORAL | 1 refills | Status: DC

## 2017-03-23 MED ORDER — FLUORESCEIN SODIUM 0.6 MG OP STRP
ORAL_STRIP | OPHTHALMIC | Status: AC
  Filled 2017-03-23: qty 1

## 2017-03-23 NOTE — ED Triage Notes (Signed)
Pt c/o left knee pain and right eye irritation onset yest   Reports he was pumping gas on his car when he went to take the nozzle out, gasoline was still pumping and it splashed onto right eye and sts he twisted his right knee  Slow gait... A&O x4... NAD

## 2017-03-23 NOTE — Discharge Instructions (Signed)
With regard to your eye pain, there were no acute findings on exam. Should you have any changes in your vision, or any worsening of your vision I would recommend going to the emergency room as soon as possible. If your symptoms fail to resolve within one week, I'd recommend following up with an ophthalmologist.  With regard to your knee pain, most likely musculoskeletal in nature rather than fracture or dislocation. I prescribed a medicine for pain and inflammation called meloxicam, take one tablet daily. We have placed your knee in a knee sleeve, I recommend this for support. I also recommend rest, ice 15 minutes at a time alternated with heat and elevation when possible. If your symptoms persist or fail to resolve, follow up with your primary care doctor or an orthopedist

## 2017-03-23 NOTE — ED Provider Notes (Signed)
CSN: [id number]     Arrival date & time 03/23/17  1901 History   First MD Initiated Contact with Patient 03/23/17 2004     Chief Complaint  Patient presents with  . Knee Pain  . Eye Pain   (Consider location/radiation/quality/duration/timing/severity/associated sxs/prior Treatment) 66 year old male presents to clinic for evaluation of right eye pain, and left knee pain. He states he was pumping gas yesterday, when there was a spell, and he was "splashed in the eye" he also states that this caused him to step back onto the curve, twisting his knee. He has no change in vision, no loss of vision, no blurred vision, however he does have some eye pain. He has no known history of eye difficulties underlying eye pathology, no glaucoma, no history of retinal detachment, no history of diabetes or hypertension. With regard to his knee pain, he is able to walk and bear weight, pain is centered in the lateral side of the knee. He has had some swelling but no bruising.   The history is provided by the patient.  Knee Pain  Location:  Knee Time since incident:  1 day Injury: yes   Mechanism of injury: fall   Fall:    Fall occurred:  Standing   Height of fall:  Standing position   Impact surface:  Hard floor   Point of impact: side. Knee location:  L knee Pain details:    Quality:  Aching and dull   Radiates to:  Does not radiate   Severity:  Moderate   Onset quality:  Sudden   Duration:  1 day   Timing:  Constant   Progression:  Unchanged Chronicity:  New Dislocation: no   Foreign body present:  No foreign bodies Tetanus status:  Unknown Prior injury to area:  No Relieved by:  Rest and NSAIDs Worsened by:  Bearing weight and rotation Associated symptoms: swelling   Associated symptoms: no back pain, no decreased ROM, no fever, no muscle weakness, no neck pain, no numbness, no stiffness and no tingling   Eye Pain  Pertinent negatives include no chest pain, no abdominal pain and no shortness  of breath.    Past Medical History:  Diagnosis Date  . SDH (subdural hematoma) (HCC)    Past Surgical History:  Procedure Laterality Date  . CARPAL TUNNEL RELEASE    . SHOULDER SURGERY     History reviewed. No pertinent family history. Social History  Substance Use Topics  . Smoking status: Never Smoker  . Smokeless tobacco: Never Used  . Alcohol use 1.8 oz/week    2 Glasses of wine, 1 Shots of liquor per week    Review of Systems  Constitutional: Negative for chills and fever.  HENT: Positive for ear pain. Negative for ear discharge, sinus pain and sinus pressure.   Eyes: Positive for pain.  Respiratory: Negative for shortness of breath and wheezing.   Cardiovascular: Negative for chest pain and palpitations.  Gastrointestinal: Negative for abdominal pain, nausea and vomiting.  Musculoskeletal: Positive for joint swelling. Negative for back pain, neck pain, neck stiffness and stiffness.  Skin: Negative for color change and wound.  Neurological: Negative for dizziness, light-headedness and numbness.    Allergies  Patient has no known allergies.  Home Medications   Prior to Admission medications   Medication Sig Start Date End Date Taking? Authorizing Provider  allopurinol (ZYLOPRIM) 300 MG tablet Take 300 mg by mouth daily.   Yes Historical Provider, MD  finasteride (PROSCAR) 5 MG tablet  TAKE 1 TABLET ORAL DAILY. 08/28/16  Yes Historical Provider, MD  metoprolol succinate (TOPROL-XL) 25 MG 24 hr tablet Take 1 tablet (25 mg total) by mouth daily. Take with or immediately following a meal. 02/07/17 05/08/17 Yes Josue Hector, MD  tamsulosin (FLOMAX) 0.4 MG CAPS capsule Take 0.4 mg by mouth daily after breakfast.   Yes Historical Provider, MD  acetaminophen (TYLENOL) 500 MG tablet Take 500-1,000 mg by mouth every 6 (six) hours as needed for mild pain.    Historical Provider, MD  colchicine 0.6 MG tablet Take 1 tablet (0.6 mg total) by mouth daily as needed (FOR GOUT FLAREUPS).  12/01/16   Naitik Panwala, PA-C  meloxicam (MOBIC) 15 MG tablet Take 1 tablet (15 mg total) by mouth daily. 03/23/17   Barnet Glasgow, NP  Multiple Vitamin (MULTIVITAMIN WITH MINERALS) TABS tablet Take 1 tablet by mouth daily.    Historical Provider, MD   Meds Ordered and Administered this Visit  Medications - No data to display  BP (!) 129/97 (BP Location: Left Arm)   Pulse 90   Temp 98.1 F (36.7 C) (Oral)   Resp 16   SpO2 100%  No data found.   Physical Exam  Constitutional: He is oriented to person, place, and time. He appears well-developed and well-nourished. No distress.  HENT:  Head: Normocephalic and atraumatic.  Right Ear: External ear normal.  Left Ear: External ear normal.  Eyes: Conjunctivae and lids are normal. Pupils are equal, round, and reactive to light. Lids are everted and swept, no foreign bodies found. Right eye exhibits no discharge. No foreign body present in the right eye. Left eye exhibits no discharge. No foreign body present in the left eye. Right conjunctiva is not injected. Right conjunctiva has no hemorrhage. Left conjunctiva is not injected. Left conjunctiva has no hemorrhage. No scleral icterus.  Fundoscopic exam:      The right eye shows no AV nicking, no hemorrhage and no papilledema. The right eye shows red reflex.       The left eye shows no AV nicking, no hemorrhage and no papilledema. The left eye shows red reflex.  Slit lamp exam:      The right eye shows no corneal abrasion and no fluorescein uptake.  Neck: Normal range of motion.  Cardiovascular: Normal rate and regular rhythm.   Pulmonary/Chest: Effort normal and breath sounds normal.  Musculoskeletal: Normal range of motion. He exhibits no edema or deformity.       Left knee: He exhibits swelling. He exhibits no deformity, no LCL laxity and no MCL laxity. Tenderness found. LCL tenderness noted.  Neurological: He is alert and oriented to person, place, and time.  Skin: Skin is warm.  Capillary refill takes less than 2 seconds. He is not diaphoretic.  Psychiatric: He has a normal mood and affect. His behavior is normal.  Nursing note and vitals reviewed.   Urgent Care Course     Procedures (including critical care time)  Labs Review Labs Reviewed - No data to display  Imaging Review No results found.   Visual Acuity Review  Right Eye Distance:  20/50 Left Eye Distance:  20/50 Bilateral Distance:  20/50  Right Eye Near:   Left Eye Near:    Bilateral Near:         MDM   1. Acute pain of left knee   2. Eye pain, right    Funduscopic exam unremarkable, no fluorescein uptake in the right eye, no foreign bodies present.  Visual acuity was 20/50 in both eyes, visual fields were tested by [employer] no deficit were noted. Patient was advised if at any time if symptoms change, worsen, there is any new symptoms, then go to the emergency room as soon as possible.  With regard to the knee pain, knee was placed in a knee sleeve, prescription given for meloxicam, advised RICE and to follow-up with primary care or orthopedics if symptoms persist.    Barnet Glasgow, NP 03/23/17 [phone]

## 2017-04-02 ENCOUNTER — Encounter (INDEPENDENT_AMBULATORY_CARE_PROVIDER_SITE_OTHER): Payer: Self-pay | Admitting: Orthopedic Surgery

## 2017-04-02 ENCOUNTER — Ambulatory Visit (INDEPENDENT_AMBULATORY_CARE_PROVIDER_SITE_OTHER): Payer: Medicare Other | Admitting: Orthopedic Surgery

## 2017-04-02 DIAGNOSIS — M25562 Pain in left knee: Secondary | ICD-10-CM

## 2017-04-02 MED ORDER — BUPIVACAINE HCL 0.25 % IJ SOLN
4.0000 mL | INTRAMUSCULAR | Status: AC | PRN
  Administered 2017-04-02: 4 mL via INTRA_ARTICULAR

## 2017-04-02 MED ORDER — LIDOCAINE HCL 1 % IJ SOLN
5.0000 mL | INTRAMUSCULAR | Status: AC | PRN
  Administered 2017-04-02: 5 mL

## 2017-04-02 NOTE — Progress Notes (Signed)
Office Visit Note   Patient: Jose [name]           Date of Birth: [birth year]           MRN: [id number] Visit Date: 04/02/2017 Requested by: [name], MD [address], [place], [place] [address] PCP: [name], MD  Subjective: Chief Complaint  Patient presents with  . Left Knee - Pain    HPI: Jose [name] is a 66 year old patient with left knee pain.  Date of injury 03/22/2017.  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  Gas was getting on his body and his face and he had to quickly inside.  He slipped and twisted his knee.  The floor was wet in the restroom at the gas station.  He was doing well after the you flex the series until this incident occurred.  He reports more pain and instability.  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  He localizes the pain about 3 fingerbreadths above the patella.  Last cortisone shot helped him but it was 3 weeks ago.  He states his pain is identical.              ROS: All systems reviewed are negative as they relate to the chief complaint within the history of present illness.  Patient denies  fevers or chills.   Assessment & Plan: Visit Diagnoses:  1. Left knee pain, unspecified chronicity     Plan: Impression is left knee pain without effusion some superior patellar tenderness.  Could represent partial quad tear though his extension is full and intact.  Plan is for Toradol injection into the knee today with an Ace wrap.  Think if that doesn't help we could consider further evaluation or intervention.  I'll see him back as needed  Follow-Up Instructions: Return if symptoms worsen or fail to improve.   Orders:  No orders of the defined types were placed in this encounter.  No orders of the defined types were placed in this encounter.     Procedures: Large Joint Inj Date/Time: 04/02/2017 7:27 PM Performed by: Meredith Pel Authorized by: Meredith Pel   Consent  Given by:  Patient Site marked: the procedure site was marked   Timeout: prior to procedure the correct patient, procedure, and site was verified   Indications:  Pain, joint swelling and diagnostic evaluation Location:  Knee Site:  L knee Prep: patient was prepped and draped in usual sterile fashion   Needle Size:  18 G Needle Length:  1.5 inches Approach:  Superolateral Ultrasound Guidance: No   Fluoroscopic Guidance: No   Arthrogram: No   Medications:  5 mL lidocaine 1 %; 4 mL bupivacaine 0.25 % Patient tolerance:  Patient tolerated the procedure well with no immediate complications  30 mg of Toradol injected along with Marcaine     Clinical Data: No additional findings.  Objective: Vital Signs: There were no vitals taken for this visit.  Physical Exam:   Constitutional: Patient appears well-developed HEENT:  Head: Normocephalic Eyes:EOM are normal Neck: Normal range of motion Cardiovascular: Normal rate Pulmonary/chest: Effort normal Neurologic: Patient is alert Skin: Skin is warm Psychiatric: Patient has normal mood and affect    Ortho Exam: Examination of the left knee demonstrates full active and passive range of motion but with some superior quadrant tenderness.  Extensor mechanism is intact with no lag.  Collateral cruciate ligaments are stable.  No discrete  joint line tenderness present.  No effusion in the knee.  No bruising is noted.  Pedal pulses palpable.  No other masses lymph adenopathy or skin changes noted in the left knee region.  Specialty Comments:  No specialty comments available.  Imaging: No results found.   PMFS History: Patient Active Problem List   Diagnosis Date Noted  . Primary osteoarthritis of both knees 02/28/2017  . Primary osteoarthritis of both hands 02/28/2017  . Uricacidemia 02/28/2017  . Pain in joint of left knee 02/07/2017  . Idiopathic chronic gout, unspecified site, without tophus (tophi) 11/29/2016  . HEMATURIA  UNSPECIFIED 01/25/2009  . ABDOMINAL PAIN 01/25/2009  . XERODERMA 03/10/2008  . UNS ADVRS EFF UNS RX MEDICINAL&BIOLOGICAL SBSTNC 03/10/2008  . ADJUSTMENT REACTION WITH PHYSICAL SYMPTOMS 02/14/2008  . MUSCLE SPASM 02/14/2008  . ACUTE PROSTATITIS 12/16/2007  . BREAST LUMP OR MASS, RIGHT 07/12/2007  . H/O: RCT (rotator cuff tear) 01/13/2007  . GOUT 01/08/2007   Past Medical History:  Diagnosis Date  . SDH (subdural hematoma) (HCC)     No family history on file.  Past Surgical History:  Procedure Laterality Date  . CARPAL TUNNEL RELEASE    . SHOULDER SURGERY     Social History   Occupational History  . Not on file.   Social History Main Topics  . Smoking status: Never Smoker  . Smokeless tobacco: Never Used  . Alcohol use 1.8 oz/week    2 Glasses of wine, 1 Shots of liquor per week  . Drug use: No  . Sexual activity: Not on file

## 2017-04-06 ENCOUNTER — Encounter (HOSPITAL_COMMUNITY): Payer: Self-pay | Admitting: *Deleted

## 2017-04-06 ENCOUNTER — Emergency Department (HOSPITAL_COMMUNITY)
Admission: EM | Admit: 2017-04-06 | Discharge: 2017-04-06 | Disposition: A | Discharge status: Home / Self Care | Payer: Medicare Other | Attending: Emergency Medicine | Admitting: Emergency Medicine

## 2017-04-06 DIAGNOSIS — H5711 Ocular pain, right eye: Secondary | ICD-10-CM | POA: Diagnosis present

## 2017-04-06 DIAGNOSIS — H578 Other specified disorders of eye and adnexa: Secondary | ICD-10-CM | POA: Diagnosis not present

## 2017-04-06 DIAGNOSIS — Z79899 Other long term (current) drug therapy: Secondary | ICD-10-CM | POA: Diagnosis not present

## 2017-04-06 DIAGNOSIS — H53141 Visual discomfort, right eye: Secondary | ICD-10-CM | POA: Diagnosis not present

## 2017-04-06 MED ORDER — TETRACAINE HCL 0.5 % OP SOLN
1.0000 [drp] | Freq: Once | OPHTHALMIC | Status: AC
  Administered 2017-04-06: 1 [drp] via OPHTHALMIC
  Filled 2017-04-06: qty 2

## 2017-04-06 MED ORDER — FLUORESCEIN SODIUM 0.6 MG OP STRP
1.0000 | ORAL_STRIP | Freq: Once | OPHTHALMIC | Status: AC
  Administered 2017-04-06: 1 via OPHTHALMIC
  Filled 2017-04-06: qty 1

## 2017-04-06 NOTE — ED Notes (Signed)
Visual acuity on Right eye, no glasses, 10/25

## 2017-04-06 NOTE — ED Notes (Signed)
The pt also wants to be seen for rt knee pain  That has hurt since the 5th of April.  He has been seen by dr s dean the ortho doctor and had it xrayed

## 2017-04-06 NOTE — ED Triage Notes (Signed)
Pt states that he was pumping gas and the gas got in his rt eye on the 5th. Pt was seen at [facility] for that. Pt reports continued eye irritation and blurred vision.

## 2017-04-06 NOTE — ED Notes (Signed)
The pt   Got gas in his rt eye April 5th  Since then he has had some floaters and sometimes a visual cut  No pain.  He has just been too busy with the  [employer] to have it checked until today,  It just bothers him

## 2017-04-06 NOTE — Discharge Instructions (Signed)
Please return to the Emergency Department for new symptoms or if your current symptoms worsen. Please call your optometrist for a follow-up appointment if symptoms persist. Please call your orthopedist to schedule a follow-up appointment for your knee.
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
## 2017-04-12 ENCOUNTER — Encounter (HOSPITAL_COMMUNITY): Payer: Self-pay | Admitting: Family Medicine

## 2017-04-12 ENCOUNTER — Ambulatory Visit (HOSPITAL_COMMUNITY)
Admission: EM | Admit: 2017-04-12 | Discharge: 2017-04-12 | Disposition: A | Discharge status: Home / Self Care | Payer: Medicare Other | Attending: Family Medicine | Admitting: Family Medicine

## 2017-04-12 DIAGNOSIS — K219 Gastro-esophageal reflux disease without esophagitis: Secondary | ICD-10-CM | POA: Diagnosis not present

## 2017-04-12 MED ORDER — ESOMEPRAZOLE MAGNESIUM 40 MG PO CPDR: 40.0000 mg | DELAYED_RELEASE_CAPSULE | Freq: Every day | ORAL | 0 refills | Status: DC

## 2017-04-12 NOTE — ED Provider Notes (Signed)
[place]    CSN: [id number] Arrival date & time: 04/12/17  1128     History   Chief Complaint No chief complaint on file.   HPI Jose [name] is a 66 y.o. male.   This is a 66 year old man who comes in complaining of symptoms consistent with reflux esophagitis.  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 He's also had some hoarseness and burning in his throat.  Patient denies any abdominal pain. He's not a smoker. He has not changed his diet recently. He has had some fatigue however  Patient has tried Zantac without relief. His symptoms are mainly in the evening and at night after his evening meal.      Past Medical History:  Diagnosis Date  . SDH (subdural hematoma) [facility])     Patient Active Problem List   Diagnosis Date Noted  . Primary osteoarthritis of both knees 02/28/2017  . Primary osteoarthritis of both hands 02/28/2017  . Uricacidemia 02/28/2017  . Pain in joint of left knee 02/07/2017  . Idiopathic chronic gout, unspecified site, without tophus (tophi) 11/29/2016  . HEMATURIA UNSPECIFIED 01/25/2009  . ABDOMINAL PAIN 01/25/2009  . XERODERMA 03/10/2008  . UNS ADVRS EFF UNS RX MEDICINAL&BIOLOGICAL SBSTNC 03/10/2008  . ADJUSTMENT REACTION WITH PHYSICAL SYMPTOMS 02/14/2008  . MUSCLE SPASM 02/14/2008  . ACUTE PROSTATITIS 12/16/2007  . BREAST LUMP OR MASS, RIGHT 07/12/2007  . H/O: RCT (rotator cuff tear) 01/13/2007  . GOUT 01/08/2007    Past Surgical History:  Procedure Laterality Date  . CARPAL TUNNEL RELEASE    . SHOULDER SURGERY         Home Medications    Prior to Admission medications   Medication Sig Start Date End Date Taking? Authorizing Provider  acetaminophen (TYLENOL) 500 MG tablet Take 500-1,000 mg by mouth every 6 (six) hours as needed for mild pain.    Historical Provider, MD  allopurinol (ZYLOPRIM) 300 MG  tablet Take 300 mg by mouth daily.    Historical Provider, MD  colchicine 0.6 MG tablet Take 1 tablet (0.6 mg total) by mouth daily as needed (FOR GOUT FLAREUPS). 12/01/16   Naitik Panwala, PA-C  esomeprazole (NEXIUM) 40 MG capsule Take 1 capsule (40 mg total) by mouth daily. 04/12/17   Robyn Haber, MD  finasteride (PROSCAR) 5 MG tablet TAKE 1 TABLET ORAL DAILY. 08/28/16   Historical Provider, MD  metoprolol succinate (TOPROL-XL) 25 MG 24 hr tablet Take 1 tablet (25 mg total) by mouth daily. Take with or immediately following a meal. 02/07/17 05/08/17  Josue Hector, MD  Multiple Vitamin (MULTIVITAMIN WITH MINERALS) TABS tablet Take 1 tablet by mouth daily.    Historical Provider, MD  tamsulosin (FLOMAX) 0.4 MG CAPS capsule Take 0.4 mg by mouth daily after breakfast.    Historical Provider, MD    Family History No family history on file.  Social History Social History  Substance Use Topics  . Smoking status: Never Smoker  . Smokeless tobacco: Never Used  . Alcohol use 1.8 oz/week    2 Glasses of wine, 1 Shots of liquor per week     Allergies   Patient has no known allergies.   Review of Systems Review of Systems  Constitutional: Positive for fatigue.  Gastrointestinal: Positive for vomiting.  All other systems reviewed and are negative.    Physical Exam Triage Vital Signs ED Triage Vitals  Enc Vitals Group     BP      Pulse      Resp      Temp      Temp src      SpO2      Weight      Height      Head Circumference      Peak Flow      Pain Score      Pain Loc      Pain Edu?      Excl. in [place]?    No data found.   Updated Vital Signs BP 120/82 (BP Location: Left Arm)   Pulse 76   Temp 97.9 F (36.6 C) (Oral)   Resp 16   Ht 6' (1.829 m)   Wt 197 lb (89.4 kg)   SpO2 95%   BMI 26.72 kg/m    Physical Exam  Constitutional: He is oriented to person, place, and time. He appears well-developed and well-nourished.  HENT:  Right Ear: External ear normal.    Left Ear: External ear normal.  Mouth/Throat: Oropharynx is clear and moist.  Somewhat raspy voice  Eyes: Conjunctivae and EOM are normal. Pupils are equal, round, and reactive to light.  Neck: Normal range of motion. Neck supple.  Cardiovascular: Normal rate, regular rhythm and normal heart sounds.   Pulmonary/Chest: Effort normal and breath sounds normal.  Abdominal: Soft. He exhibits no distension. There is no tenderness.  Musculoskeletal: Normal range of motion.  Neurological: He is alert and oriented to person, place, and time.  Skin: Skin is warm and dry.  Nursing note and vitals reviewed.    UC Treatments / Results  Labs (all labs ordered are listed, but only abnormal results are displayed) Labs Reviewed - No data to display  EKG  EKG Interpretation None       Radiology No results found.  Procedures Procedures (including critical care time)  Medications Ordered in UC Medications - No data to display   Initial Impression / Assessment and Plan / UC Course  I have reviewed the triage vital signs and the nursing notes.  Pertinent labs & imaging results that were available during my care of the patient were reviewed by me and considered in my medical decision making (see chart for details).     Final Clinical Impressions(s) / UC Diagnoses   Final diagnoses:  Gastroesophageal reflux disease without esophagitis    New Prescriptions New Prescriptions   ESOMEPRAZOLE (NEXIUM) 40 MG CAPSULE    Take 1 capsule (40 mg total) by mouth daily.     Robyn Haber, MD 04/12/17 1215

## 2017-04-12 NOTE — ED Triage Notes (Signed)
PT reports for the past few weeks while laying down or around 5am he wakes up with a "hacking" cough that makes him vomit. PT vomits multiple times when this happens. PT describes vomit as yellow stomach acid. PT reports yesterday his throat was burning significantly. PT reports his voice has become more raspy as well. PT denies chest pain and abdominal pain. PT has had gastric reflux in the past. PT takes zantac PRN.

## 2017-04-12 NOTE — Discharge Instructions (Signed)
Call Dr. Benson Norway if your symptoms are persisting. You may have a gallbladder problem and there may be some esophagitis that continuing to precipitate the bitter vomiting.

## 2017-04-16 ENCOUNTER — Ambulatory Visit (INDEPENDENT_AMBULATORY_CARE_PROVIDER_SITE_OTHER): Payer: Medicare Other | Admitting: Orthopedic Surgery

## 2017-04-16 ENCOUNTER — Encounter (INDEPENDENT_AMBULATORY_CARE_PROVIDER_SITE_OTHER): Payer: Self-pay | Admitting: Orthopedic Surgery

## 2017-04-16 DIAGNOSIS — M170 Bilateral primary osteoarthritis of knee: Secondary | ICD-10-CM | POA: Diagnosis not present

## 2017-04-16 DIAGNOSIS — M25562 Pain in left knee: Secondary | ICD-10-CM | POA: Diagnosis not present

## 2017-04-16 NOTE — Progress Notes (Signed)
Office Visit Note   Patient: Jose [name]           Date of Birth: 05-16-1951           MRN: [id number] Visit Date: 04/16/2017 Requested by: [name], MD [address], [place], [place] [address] PCP: [name], MD  Subjective: Chief Complaint  Patient presents with  . Left Knee - Follow-up    HPI: Jose [name] is a 66 year old patient with left knee pain.  Injury 03/22/2017.  Twisted the knee and he had minimal relief from a Toradol injection performed last office visit.  He localizes the pain superior to the patella.  States he feels like there is a bump in his knee.  Does have difficulty sleeping.  He's taking Tylenol for pain.  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  He has to stop twice during a trip to [place]..              ROS: All systems reviewed are negative as they relate to the chief complaint within the history of present illness.  Patient denies  fevers or chills.   Assessment & Plan: Visit Diagnoses:  1. Primary osteoarthritis of both knees   2. Left knee pain, unspecified chronicity     Plan: Impression is left knee partial quad tendon rupture.  Patient does have full extension against gravity but ultrasound examination today suggest partial quad tendon tear.  Difficult to really quantify the amount of the tear.  MRI scan would be necessary to guide surgical decision-making regarding repair of this partial quad tendon tear.  I'll see him back after that scan.  He has been having symptoms now for over 6 weeks and it is not relenting.  Follow-Up Instructions: Return for after MRI.   Orders:  Orders Placed This Encounter  Procedures  . MR Knee Left w/o contrast   No orders of the defined types were placed in this encounter.     Procedures: No procedures performed   Clinical Data: No additional findings.  Objective: Vital Signs: There were no vitals taken for this visit.  Physical Exam:   Constitutional: Patient appears  well-developed HEENT:  Head: Normocephalic Eyes:EOM are normal Neck: Normal range of motion Cardiovascular: Normal rate Pulmonary/chest: Effort normal Neurologic: Patient is alert Skin: Skin is warm Psychiatric: Patient has normal mood and affect    Ortho Exam: Orthopedic exam demonstrates slight antalgic gait to the left does have a little bit of focal fusiform swelling proximal to the patella by 2-3 fingerbreadths.  Full extension against gravity with palpable quad tendon.  Collateral patient's are stable there is no knee effusion on the left.  Strength is about 5 minus out of 5 on the left compared to 5+ out of 5 on the right.  There is no extensor lag.  Specialty Comments:  No specialty comments available.  Imaging: No results found.   PMFS History: Patient Active Problem List   Diagnosis Date Noted  . Primary osteoarthritis of both knees 02/28/2017  . Primary osteoarthritis of both hands 02/28/2017  . Uricacidemia 02/28/2017  . Pain in joint of left knee 02/07/2017  . Idiopathic chronic gout, unspecified site, without tophus (tophi) 11/29/2016  . HEMATURIA UNSPECIFIED 01/25/2009  . ABDOMINAL PAIN 01/25/2009  . XERODERMA 03/10/2008  . UNS ADVRS EFF UNS RX MEDICINAL&BIOLOGICAL SBSTNC 03/10/2008  . ADJUSTMENT REACTION WITH PHYSICAL SYMPTOMS 02/14/2008  . MUSCLE SPASM 02/14/2008  . ACUTE PROSTATITIS 12/16/2007  . BREAST  LUMP OR MASS, RIGHT 07/12/2007  . H/O: RCT (rotator cuff tear) 01/13/2007  . GOUT 01/08/2007   Past Medical History:  Diagnosis Date  . SDH (subdural hematoma) ([facility])     No family history on file.  Past Surgical History:  Procedure Laterality Date  . CARPAL TUNNEL RELEASE    . SHOULDER SURGERY     Social History   Occupational History  . Not on file.   Social History Main Topics  . Smoking status: Never Smoker  . Smokeless tobacco: Never Used  . Alcohol use 1.8 oz/week    2 Glasses of wine, 1 Shots of liquor per week  . Drug use: No  .  Sexual activity: Not on file

## 2017-04-17 DIAGNOSIS — N400 Enlarged prostate without lower urinary tract symptoms: Secondary | ICD-10-CM | POA: Diagnosis not present

## 2017-04-20 DIAGNOSIS — N138 Other obstructive and reflux uropathy: Secondary | ICD-10-CM | POA: Diagnosis not present

## 2017-04-20 DIAGNOSIS — Z8679 Personal history of other diseases of the circulatory system: Secondary | ICD-10-CM | POA: Diagnosis not present

## 2017-04-20 DIAGNOSIS — K219 Gastro-esophageal reflux disease without esophagitis: Secondary | ICD-10-CM | POA: Diagnosis not present

## 2017-04-20 DIAGNOSIS — N401 Enlarged prostate with lower urinary tract symptoms: Secondary | ICD-10-CM | POA: Diagnosis not present

## 2017-04-20 DIAGNOSIS — N3289 Other specified disorders of bladder: Secondary | ICD-10-CM | POA: Diagnosis not present

## 2017-04-20 DIAGNOSIS — I10 Essential (primary) hypertension: Secondary | ICD-10-CM | POA: Diagnosis not present

## 2017-04-20 DIAGNOSIS — Z79899 Other long term (current) drug therapy: Secondary | ICD-10-CM | POA: Diagnosis not present

## 2017-04-20 DIAGNOSIS — R972 Elevated prostate specific antigen [PSA]: Secondary | ICD-10-CM | POA: Diagnosis not present

## 2017-04-25 DIAGNOSIS — L988 Other specified disorders of the skin and subcutaneous tissue: Secondary | ICD-10-CM | POA: Diagnosis not present

## 2017-04-25 DIAGNOSIS — D485 Neoplasm of uncertain behavior of skin: Secondary | ICD-10-CM | POA: Diagnosis not present

## 2017-04-25 DIAGNOSIS — C44519 Basal cell carcinoma of skin of other part of trunk: Secondary | ICD-10-CM | POA: Diagnosis not present

## 2017-04-26 ENCOUNTER — Ambulatory Visit
Admission: RE | Admit: 2017-04-26 | Discharge: 2017-04-26 | Disposition: A | Discharge status: Home / Self Care | Payer: Medicare Other | Source: Ambulatory Visit | Attending: Orthopedic Surgery | Admitting: Orthopedic Surgery

## 2017-04-26 DIAGNOSIS — F438 Other reactions to severe stress: Secondary | ICD-10-CM | POA: Diagnosis not present

## 2017-04-26 DIAGNOSIS — M25562 Pain in left knee: Secondary | ICD-10-CM

## 2017-04-27 DIAGNOSIS — Z79899 Other long term (current) drug therapy: Secondary | ICD-10-CM | POA: Diagnosis not present

## 2017-04-27 DIAGNOSIS — R972 Elevated prostate specific antigen [PSA]: Secondary | ICD-10-CM | POA: Diagnosis not present

## 2017-04-27 DIAGNOSIS — I10 Essential (primary) hypertension: Secondary | ICD-10-CM | POA: Diagnosis not present

## 2017-04-27 DIAGNOSIS — N138 Other obstructive and reflux uropathy: Secondary | ICD-10-CM | POA: Diagnosis not present

## 2017-04-27 DIAGNOSIS — K219 Gastro-esophageal reflux disease without esophagitis: Secondary | ICD-10-CM | POA: Diagnosis not present

## 2017-04-27 DIAGNOSIS — Z8679 Personal history of other diseases of the circulatory system: Secondary | ICD-10-CM | POA: Diagnosis not present

## 2017-04-27 DIAGNOSIS — N401 Enlarged prostate with lower urinary tract symptoms: Secondary | ICD-10-CM | POA: Diagnosis not present

## 2017-04-27 DIAGNOSIS — N3289 Other specified disorders of bladder: Secondary | ICD-10-CM | POA: Diagnosis not present

## 2017-04-27 DIAGNOSIS — N4289 Other specified disorders of prostate: Secondary | ICD-10-CM | POA: Diagnosis not present

## 2017-04-27 HISTORY — PX: GREEN LIGHT LASER TURP (TRANSURETHRAL RESECTION OF PROSTATE: SHX6260

## 2017-04-28 ENCOUNTER — Inpatient Hospital Stay: Admission: RE | Admit: 2017-04-28 | Payer: Medicare Other | Source: Ambulatory Visit

## 2017-04-28 DIAGNOSIS — N3289 Other specified disorders of bladder: Secondary | ICD-10-CM | POA: Diagnosis not present

## 2017-04-28 DIAGNOSIS — I10 Essential (primary) hypertension: Secondary | ICD-10-CM | POA: Diagnosis not present

## 2017-04-28 DIAGNOSIS — N401 Enlarged prostate with lower urinary tract symptoms: Secondary | ICD-10-CM | POA: Diagnosis not present

## 2017-04-28 DIAGNOSIS — K219 Gastro-esophageal reflux disease without esophagitis: Secondary | ICD-10-CM | POA: Diagnosis not present

## 2017-04-28 DIAGNOSIS — N138 Other obstructive and reflux uropathy: Secondary | ICD-10-CM | POA: Diagnosis not present

## 2017-04-28 DIAGNOSIS — R972 Elevated prostate specific antigen [PSA]: Secondary | ICD-10-CM | POA: Diagnosis not present

## 2017-05-03 DIAGNOSIS — F438 Other reactions to severe stress: Secondary | ICD-10-CM | POA: Diagnosis not present

## 2017-05-07 DIAGNOSIS — F438 Other reactions to severe stress: Secondary | ICD-10-CM | POA: Diagnosis not present

## 2017-05-09 DIAGNOSIS — L820 Inflamed seborrheic keratosis: Secondary | ICD-10-CM | POA: Diagnosis not present

## 2017-05-10 ENCOUNTER — Encounter (HOSPITAL_COMMUNITY): Payer: Self-pay | Admitting: Family Medicine

## 2017-05-10 ENCOUNTER — Ambulatory Visit (HOSPITAL_COMMUNITY)
Admission: EM | Admit: 2017-05-10 | Discharge: 2017-05-10 | Disposition: A | Discharge status: Home / Self Care | Payer: Medicare Other | Attending: Internal Medicine | Admitting: Internal Medicine

## 2017-05-10 DIAGNOSIS — R310 Gross hematuria: Secondary | ICD-10-CM | POA: Insufficient documentation

## 2017-05-10 DIAGNOSIS — R319 Hematuria, unspecified: Secondary | ICD-10-CM | POA: Diagnosis present

## 2017-05-10 LAB — URINALYSIS, ROUTINE W REFLEX MICROSCOPIC
Bilirubin Urine: NEGATIVE
GLUCOSE, UA: NEGATIVE mg/dL
KETONES UR: 5 mg/dL — AB
NITRITE: NEGATIVE
PH: 5 (ref 5.0–8.0)
Protein, ur: 100 mg/dL — AB
Specific Gravity, Urine: 1.019 (ref 1.005–1.030)

## 2017-05-10 NOTE — ED Provider Notes (Signed)
[facility]    CSN: [id number] Arrival date & time: 05/10/17  1830     History   Chief Complaint Chief Complaint  Patient presents with  . Hematuria    HPI Kabir A Rabe is a 66 y.o. male. He had some sort of prostate procedure involving a green laser in [place] recently, about 2 weeks ago. He had some blood in his urine last night, and was advised by his urologist to come have this checked with the urinalysis and urine culture at the urgent care. No fever, no malaise, no flank pain. Not vomiting.    HPI  Past Medical History:  Diagnosis Date  . SDH (subdural hematoma) [facility])     Patient Active Problem List   Diagnosis Date Noted  . Primary osteoarthritis of both knees 02/28/2017  . Primary osteoarthritis of both hands 02/28/2017  . Uricacidemia 02/28/2017  . Pain in joint of left knee 02/07/2017  . Idiopathic chronic gout, unspecified site, without tophus (tophi) 11/29/2016  . HEMATURIA UNSPECIFIED 01/25/2009  . ABDOMINAL PAIN 01/25/2009  . XERODERMA 03/10/2008  . UNS ADVRS EFF UNS RX MEDICINAL&BIOLOGICAL SBSTNC 03/10/2008  . ADJUSTMENT REACTION WITH PHYSICAL SYMPTOMS 02/14/2008  . MUSCLE SPASM 02/14/2008  . ACUTE PROSTATITIS 12/16/2007  . BREAST LUMP OR MASS, RIGHT 07/12/2007  . H/O: RCT (rotator cuff tear) 01/13/2007  . GOUT 01/08/2007    Past Surgical History:  Procedure Laterality Date  . CARPAL TUNNEL RELEASE    . SHOULDER SURGERY         Home Medications    Prior to Admission medications   Medication Sig Start Date End Date Taking? Authorizing Provider  acetaminophen (TYLENOL) 500 MG tablet Take 500-1,000 mg by mouth every 6 (six) hours as needed for mild pain.    [provider]  allopurinol (ZYLOPRIM) 300 MG tablet Take 300 mg by mouth daily.    [provider]  colchicine 0.6 MG tablet Take 1 tablet (0.6 mg total) by mouth daily as needed (FOR GOUT FLAREUPS). 12/01/16   Panwala, Naitik, PA-C  esomeprazole  (NEXIUM) 40 MG capsule Take 1 capsule (40 mg total) by mouth daily. 04/12/17   Robyn Haber, MD  finasteride (PROSCAR) 5 MG tablet TAKE 1 TABLET ORAL DAILY. 08/28/16   [provider]  metoprolol succinate (TOPROL-XL) 25 MG 24 hr tablet Take 1 tablet (25 mg total) by mouth daily. Take with or immediately following a meal. 02/07/17 05/08/17  Josue Hector, MD  Multiple Vitamin (MULTIVITAMIN WITH MINERALS) TABS tablet Take 1 tablet by mouth daily.    [provider]  tamsulosin (FLOMAX) 0.4 MG CAPS capsule Take 0.4 mg by mouth daily after breakfast.    [provider]    Family History History reviewed. No pertinent family history.  Social History Social History  Substance Use Topics  . Smoking status: Never Smoker  . Smokeless tobacco: Never Used  . Alcohol use 1.8 oz/week    2 Glasses of wine, 1 Shots of liquor per week     Allergies   Patient has no known allergies.   Review of Systems Review of Systems  All other systems reviewed and are negative.    Physical Exam Triage Vital Signs ED Triage Vitals [05/10/17 1904]  Enc Vitals Group     BP 129/90     Pulse Rate 85     Resp 18     Temp 98.5 F (36.9 C)     Temp src      SpO2  100 %     Weight      Height      Pain Score    Updated Vital Signs BP 129/90   Pulse 85   Temp 98.5 F (36.9 C)   Resp 18   SpO2 100%   Physical Exam  Constitutional: He is oriented to person, place, and time. No distress.  Alert, nicely groomed  HENT:  Head: Atraumatic.  Eyes:  Conjugate gaze, no eye redness/drainage  Neck: Neck supple.  Cardiovascular: Normal rate.   Pulmonary/Chest: No respiratory distress.  Abdominal: He exhibits no distension.  Musculoskeletal: Normal range of motion.  Neurological: He is alert and oriented to person, place, and time.  Skin: Skin is warm and dry.  No cyanosis  Nursing note and vitals reviewed.    UC Treatments / Results  Labs Results for orders placed or  performed during the hospital encounter of 05/10/17  Urinalysis, Routine w reflex microscopic  Result Value Ref Range   Color, Urine YELLOW YELLOW   APPearance CLOUDY (A) CLEAR   Specific Gravity, Urine 1.019 1.005 - 1.030   pH 5.0 5.0 - 8.0   Glucose, UA NEGATIVE NEGATIVE mg/dL   Hgb urine dipstick LARGE (A) NEGATIVE   Bilirubin Urine NEGATIVE NEGATIVE   Ketones, ur 5 (A) NEGATIVE mg/dL   Protein, ur 100 (A) NEGATIVE mg/dL   Nitrite NEGATIVE NEGATIVE   Leukocytes, UA LARGE (A) NEGATIVE   RBC / HPF TOO NUMEROUS TO COUNT 0 - 5 RBC/hpf   WBC, UA TOO NUMEROUS TO COUNT 0 - 5 WBC/hpf   Bacteria, UA RARE (A) NONE SEEN   Squamous Epithelial / LPF 0-5 (A) NONE SEEN   WBC Clumps PRESENT    Mucous PRESENT     Procedures Procedures (including critical care time) None today  Final Clinical Impressions(s) / UC Diagnoses   Final diagnoses:  Gross hematuria   Urinalysis and culture were obtained this evening.  No fever or GI symptoms, no flank pain, so no antibiotics started.  Followup with urologist as planned.     Sherlene Shams, MD 05/11/17 [phone]

## 2017-05-10 NOTE — ED Triage Notes (Signed)
Pt here for hematuria that he noticed last night. sts some dysuria x 4 days ago. sts that he had recent surgery on prostate. Denies fever.

## 2017-05-10 NOTE — Discharge Instructions (Addendum)
Urinalysis and culture were obtained this evening.  No fever or GI symptoms, no flank pain, so no antibiotics started.  Followup with urologist as planned.

## 2017-05-11 DIAGNOSIS — F438 Other reactions to severe stress: Secondary | ICD-10-CM | POA: Diagnosis not present

## 2017-05-12 LAB — URINE CULTURE

## 2017-05-14 ENCOUNTER — Ambulatory Visit (HOSPITAL_COMMUNITY)
Admission: EM | Admit: 2017-05-14 | Discharge: 2017-05-14 | Disposition: A | Discharge status: Home / Self Care | Payer: Medicare Other | Attending: Internal Medicine | Admitting: Internal Medicine

## 2017-05-14 ENCOUNTER — Encounter (HOSPITAL_COMMUNITY): Payer: Self-pay | Admitting: Emergency Medicine

## 2017-05-14 DIAGNOSIS — R319 Hematuria, unspecified: Secondary | ICD-10-CM

## 2017-05-14 LAB — POCT URINALYSIS DIP (DEVICE)
Glucose, UA: NEGATIVE mg/dL
NITRITE: NEGATIVE
PH: 5.5 (ref 5.0–8.0)
Protein, ur: >=300 mg/dL — AB
Specific Gravity, Urine: 1.025 (ref 1.005–1.030)
Urobilinogen, UA: 0.2 mg/dL (ref 0.0–1.0)

## 2017-05-14 MED ORDER — CEPHALEXIN 500 MG PO CAPS
500.0000 mg | ORAL_CAPSULE | Freq: Four times a day (QID) | ORAL | 0 refills | Status: DC
Start: 2017-05-14 — End: 2017-06-19

## 2017-05-14 NOTE — ED Provider Notes (Signed)
CSN: [id number]     Arrival date & time 05/14/17  1048 History   None    Chief Complaint  Patient presents with  . Hematuria   (Consider location/radiation/quality/duration/timing/severity/associated sxs/prior Treatment) 66 year old male presents to clinic approximately 14 days status post laser TURP, he reports his urologist asked him to come here for urinalysis due to hematuria, and also the possibility of starting antibiotics. He was seen here for similar complaint a few days ago, he is requesting to have this laboratory work. He is denying discomfort, lying complaining of discoloration to his urine.   The history is provided by the patient.  Hematuria     Past Medical History:  Diagnosis Date  . SDH (subdural hematoma) (HCC)    Past Surgical History:  Procedure Laterality Date  . CARPAL TUNNEL RELEASE    . GREEN LIGHT LASER TURP (TRANSURETHRAL RESECTION OF PROSTATE  04/27/2017  . SHOULDER SURGERY     History reviewed. No pertinent family history. Social History  Substance Use Topics  . Smoking status: Never Smoker  . Smokeless tobacco: Never Used  . Alcohol use 1.8 oz/week    2 Glasses of wine, 1 Shots of liquor per week    Review of Systems  Constitutional: Negative.   HENT: Negative.   Gastrointestinal: Negative.   Genitourinary: Positive for hematuria. Negative for dysuria, flank pain, frequency, scrotal swelling and testicular pain.  Musculoskeletal: Negative.   Skin: Negative.   Neurological: Negative.     Allergies  Patient has no known allergies.  Home Medications   Prior to Admission medications   Medication Sig Start Date End Date Taking? Authorizing Provider  acetaminophen (TYLENOL) 500 MG tablet Take 500-1,000 mg by mouth every 6 (six) hours as needed for mild pain.    [provider]  allopurinol (ZYLOPRIM) 300 MG tablet Take 300 mg by mouth daily.    [provider]  cephALEXin (KEFLEX) 500 MG capsule Take 1 capsule (500 mg  total) by mouth 4 (four) times daily. 05/14/17   Barnet Glasgow, NP  colchicine 0.6 MG tablet Take 1 tablet (0.6 mg total) by mouth daily as needed (FOR GOUT FLAREUPS). 12/01/16   Panwala, Naitik, PA-C  esomeprazole (NEXIUM) 40 MG capsule Take 1 capsule (40 mg total) by mouth daily. 04/12/17   Robyn Haber, MD  finasteride (PROSCAR) 5 MG tablet TAKE 1 TABLET ORAL DAILY. 08/28/16   [provider]  metoprolol succinate (TOPROL-XL) 25 MG 24 hr tablet Take 1 tablet (25 mg total) by mouth daily. Take with or immediately following a meal. 02/07/17 05/08/17  Josue Hector, MD  Multiple Vitamin (MULTIVITAMIN WITH MINERALS) TABS tablet Take 1 tablet by mouth daily.    [provider]  tamsulosin (FLOMAX) 0.4 MG CAPS capsule Take 0.4 mg by mouth daily after breakfast.    [provider]   Meds Ordered and Administered this Visit  Medications - No data to display  BP (!) 129/92 (BP Location: Left Arm) Comment: notified cma  Pulse 60   Temp 98.4 F (36.9 C) (Oral)   Resp 14   SpO2 99%  No data found.   Physical Exam  Constitutional: He is oriented to person, place, and time. He appears well-developed and well-nourished. No distress.  HENT:  Head: Normocephalic and atraumatic.  Right Ear: External ear normal.  Left Ear: External ear normal.  Eyes: Conjunctivae are normal.  Neck: Normal range of motion.  Cardiovascular: Normal rate and regular rhythm.   Pulmonary/Chest: Effort normal and breath  sounds normal.  Abdominal: Soft. Bowel sounds are normal. There is no CVA tenderness.  Neurological: He is alert and oriented to person, place, and time.  Skin: Skin is warm and dry. Capillary refill takes less than 2 seconds. He is not diaphoretic.  Psychiatric: He has a normal mood and affect. His behavior is normal.  Nursing note and vitals reviewed.   Urgent Care Course     Procedures (including critical care time)  Labs Review Labs Reviewed  POCT URINALYSIS  DIP (DEVICE) - Abnormal; Notable for the following:       Result Value   Bilirubin Urine SMALL (*)    Ketones, ur TRACE (*)    Hgb urine dipstick LARGE (*)    Protein, ur >=300 (*)    Leukocytes, UA SMALL (*)    All other components within normal limits  URINE CULTURE    Imaging Review No results found.   MDM   1. Hematuria, unspecified type    Patient given paper copies of his laboratory work from both this visit, and his last visit, started on Keflex prophylactically, follow-up with his urologist.     Barnet Glasgow, NP 05/14/17 1229

## 2017-05-14 NOTE — Discharge Instructions (Signed)
Follow up with your urologist as needed for further management and evaluation of your condition.

## 2017-05-14 NOTE — ED Triage Notes (Signed)
The patient presented to the [facility] with a complaint of hematuria post Laser TURP that was completed on 04/27/2017.

## 2017-05-15 LAB — URINE CULTURE: Culture: NO GROWTH

## 2017-05-16 DIAGNOSIS — N401 Enlarged prostate with lower urinary tract symptoms: Secondary | ICD-10-CM | POA: Diagnosis not present

## 2017-05-17 ENCOUNTER — Encounter (INDEPENDENT_AMBULATORY_CARE_PROVIDER_SITE_OTHER): Payer: Self-pay | Admitting: Orthopedic Surgery

## 2017-05-17 ENCOUNTER — Ambulatory Visit (INDEPENDENT_AMBULATORY_CARE_PROVIDER_SITE_OTHER): Payer: Medicare Other | Admitting: Orthopedic Surgery

## 2017-05-17 DIAGNOSIS — F438 Other reactions to severe stress: Secondary | ICD-10-CM | POA: Diagnosis not present

## 2017-05-17 DIAGNOSIS — S76112A Strain of left quadriceps muscle, fascia and tendon, initial encounter: Secondary | ICD-10-CM | POA: Diagnosis not present

## 2017-05-17 NOTE — Progress Notes (Signed)
Office Visit Note   Patient: Jose [name]           Date of Birth: Jul 17, 1951           MRN: [id number] Visit Date: 05/17/2017 Requested by: [name], MD [address] [place], [place] [address] PCP: [name], MD  Subjective: Chief Complaint  Patient presents with  . Left Knee - Follow-up, Pain    HPI: [name] is a 66 year old active patient with left knee pain.  He is about 6-8 weeks out from injury.  He is functional with his leg but does have pain going up and down stairs.  In taking, for symptoms.  Recently had an MRI scan which is reviewed.  The scan shows partial quad tear.  Reviewed with him on the monitor.  He does have tears of the VMO and vastus intermedius.  It is retracted about 2 cm.              ROS: All systems reviewed are negative as they relate to the chief complaint within the history of present illness.  Patient denies  fevers or chills.   Assessment & Plan: Visit Diagnoses:  1. Quadriceps tendon rupture, left, initial encounter     Plan: Impression is left quad tendon partial rupture with clinical symptoms.  I think in general he would do better with operative fixation.  This been 2 months now there is some retraction but still I think there is enough problems that he can benefit from reattaching the quad back to the patella.  That would be an outpatient procedure.  He'll consider his options and call back if he wants to schedule.  I did tell him he would be in a knee immobilizer for about 3-4 weeks post surgery in order to optimize healing.  Follow-Up Instructions: Return if symptoms worsen or fail to improve.   Orders:  No orders of the defined types were placed in this encounter.  No orders of the defined types were placed in this encounter.     Procedures: No procedures performed   Clinical Data: No additional findings.  Objective: Vital Signs: There were no vitals taken for this visit.  Physical Exam:    Constitutional: Patient appears well-developed HEENT:  Head: Normocephalic Eyes:EOM are normal Neck: Normal range of motion Cardiovascular: Normal rate Pulmonary/chest: Effort normal Neurologic: Patient is alert Skin: Skin is warm Psychiatric: Patient has normal mood and affect    Ortho Exam: Orthopedic exam demonstrates full active extension and flexion of the left knee with an effusion.  There is palpable defect laterally around the proximal pole of the patella.  Extensive and strength is actually about 5 minus out of 5 on the left.  Collaterals are stable and there is no knee effusion.  Pedal pulses palpable.  No extensor lag is present.  Specialty Comments:  No specialty comments available.  Imaging: No results found.   PMFS History: Patient Active Problem List   Diagnosis Date Noted  . Quadriceps tendon rupture, left, initial encounter 05/17/2017  . Primary osteoarthritis of both knees 02/28/2017  . Primary osteoarthritis of both hands 02/28/2017  . Uricacidemia 02/28/2017  . Pain in joint of left knee 02/07/2017  . Idiopathic chronic gout, unspecified site, without tophus (tophi) 11/29/2016  . HEMATURIA UNSPECIFIED 01/25/2009  . ABDOMINAL PAIN 01/25/2009  . XERODERMA 03/10/2008  . UNS ADVRS EFF UNS RX MEDICINAL&BIOLOGICAL SBSTNC 03/10/2008  . ADJUSTMENT REACTION WITH PHYSICAL SYMPTOMS 02/14/2008  . MUSCLE SPASM 02/14/2008  .  ACUTE PROSTATITIS 12/16/2007  . BREAST LUMP OR MASS, RIGHT 07/12/2007  . H/O: RCT (rotator cuff tear) 01/13/2007  . GOUT 01/08/2007   Past Medical History:  Diagnosis Date  . SDH (subdural hematoma) (HCC)     No family history on file.  Past Surgical History:  Procedure Laterality Date  . CARPAL TUNNEL RELEASE    . GREEN LIGHT LASER TURP (TRANSURETHRAL RESECTION OF PROSTATE  04/27/2017  . SHOULDER SURGERY     Social History   Occupational History  . Not on file.   Social History Main Topics  . Smoking status: Never Smoker  .  Smokeless tobacco: Never Used  . Alcohol use 1.8 oz/week    2 Glasses of wine, 1 Shots of liquor per week  . Drug use: No  . Sexual activity: Not on file

## 2017-05-25 DIAGNOSIS — F438 Other reactions to severe stress: Secondary | ICD-10-CM | POA: Diagnosis not present

## 2017-05-29 DIAGNOSIS — N401 Enlarged prostate with lower urinary tract symptoms: Secondary | ICD-10-CM | POA: Diagnosis not present

## 2017-06-02 ENCOUNTER — Other Ambulatory Visit: Payer: Self-pay | Admitting: Rheumatology

## 2017-06-02 DIAGNOSIS — F438 Other reactions to severe stress: Secondary | ICD-10-CM | POA: Diagnosis not present

## 2017-06-04 NOTE — Telephone Encounter (Signed)
Last Visit: 03/01/17 Next Visit: 08/31/17 Labs: 02/27/17 mild increase in his creatinine 1.28 Previously normal   Okay to refill Allopurinol?

## 2017-06-07 DIAGNOSIS — F438 Other reactions to severe stress: Secondary | ICD-10-CM | POA: Diagnosis not present

## 2017-06-08 DIAGNOSIS — F438 Other reactions to severe stress: Secondary | ICD-10-CM | POA: Diagnosis not present

## 2017-06-11 DIAGNOSIS — F438 Other reactions to severe stress: Secondary | ICD-10-CM | POA: Diagnosis not present

## 2017-06-14 DIAGNOSIS — F438 Other reactions to severe stress: Secondary | ICD-10-CM | POA: Diagnosis not present

## 2017-06-15 ENCOUNTER — Emergency Department (HOSPITAL_COMMUNITY): Payer: Medicare Other

## 2017-06-15 ENCOUNTER — Inpatient Hospital Stay (HOSPITAL_COMMUNITY)
Admission: EM | Admit: 2017-06-15 | Discharge: 2017-06-19 | DRG: 502 | Disposition: A | Discharge status: Rehabilitation Facility | Payer: Medicare Other | Attending: Orthopedic Surgery | Admitting: Orthopedic Surgery

## 2017-06-15 ENCOUNTER — Encounter (HOSPITAL_COMMUNITY)
Admission: EM | Disposition: A | Discharge status: Rehabilitation Facility | Payer: Self-pay | Source: Home / Self Care | Attending: Orthopedic Surgery

## 2017-06-15 ENCOUNTER — Emergency Department (HOSPITAL_COMMUNITY): Payer: Medicare Other | Admitting: Anesthesiology

## 2017-06-15 ENCOUNTER — Encounter (HOSPITAL_COMMUNITY): Payer: Self-pay | Admitting: Emergency Medicine

## 2017-06-15 DIAGNOSIS — M1A0410 Idiopathic chronic gout, right hand, without tophus (tophi): Secondary | ICD-10-CM | POA: Diagnosis not present

## 2017-06-15 DIAGNOSIS — M1A0420 Idiopathic chronic gout, left hand, without tophus (tophi): Secondary | ICD-10-CM | POA: Diagnosis present

## 2017-06-15 DIAGNOSIS — E46 Unspecified protein-calorie malnutrition: Secondary | ICD-10-CM | POA: Diagnosis present

## 2017-06-15 DIAGNOSIS — Y9301 Activity, walking, marching and hiking: Secondary | ICD-10-CM | POA: Diagnosis present

## 2017-06-15 DIAGNOSIS — S76119A Strain of unspecified quadriceps muscle, fascia and tendon, initial encounter: Secondary | ICD-10-CM | POA: Diagnosis present

## 2017-06-15 DIAGNOSIS — D62 Acute posthemorrhagic anemia: Secondary | ICD-10-CM | POA: Diagnosis not present

## 2017-06-15 DIAGNOSIS — S8992XA Unspecified injury of left lower leg, initial encounter: Secondary | ICD-10-CM | POA: Diagnosis not present

## 2017-06-15 DIAGNOSIS — K5903 Drug induced constipation: Secondary | ICD-10-CM | POA: Diagnosis not present

## 2017-06-15 DIAGNOSIS — Z8639 Personal history of other endocrine, nutritional and metabolic disease: Secondary | ICD-10-CM | POA: Diagnosis not present

## 2017-06-15 DIAGNOSIS — S83241A Other tear of medial meniscus, current injury, right knee, initial encounter: Secondary | ICD-10-CM | POA: Diagnosis not present

## 2017-06-15 DIAGNOSIS — M19041 Primary osteoarthritis, right hand: Secondary | ICD-10-CM | POA: Diagnosis present

## 2017-06-15 DIAGNOSIS — S76111A Strain of right quadriceps muscle, fascia and tendon, initial encounter: Secondary | ICD-10-CM | POA: Diagnosis not present

## 2017-06-15 DIAGNOSIS — W19XXXA Unspecified fall, initial encounter: Secondary | ICD-10-CM

## 2017-06-15 DIAGNOSIS — T50904A Poisoning by unspecified drugs, medicaments and biological substances, undetermined, initial encounter: Secondary | ICD-10-CM | POA: Diagnosis not present

## 2017-06-15 DIAGNOSIS — M170 Bilateral primary osteoarthritis of knee: Secondary | ICD-10-CM | POA: Diagnosis not present

## 2017-06-15 DIAGNOSIS — Z9181 History of falling: Secondary | ICD-10-CM | POA: Diagnosis not present

## 2017-06-15 DIAGNOSIS — M1A0710 Idiopathic chronic gout, right ankle and foot, without tophus (tophi): Secondary | ICD-10-CM | POA: Diagnosis present

## 2017-06-15 DIAGNOSIS — S76111D Strain of right quadriceps muscle, fascia and tendon, subsequent encounter: Secondary | ICD-10-CM | POA: Diagnosis not present

## 2017-06-15 DIAGNOSIS — S76112S Strain of left quadriceps muscle, fascia and tendon, sequela: Secondary | ICD-10-CM | POA: Diagnosis not present

## 2017-06-15 DIAGNOSIS — R2689 Other abnormalities of gait and mobility: Secondary | ICD-10-CM | POA: Diagnosis present

## 2017-06-15 DIAGNOSIS — W109XXA Fall (on) (from) unspecified stairs and steps, initial encounter: Secondary | ICD-10-CM | POA: Diagnosis present

## 2017-06-15 DIAGNOSIS — M19042 Primary osteoarthritis, left hand: Secondary | ICD-10-CM | POA: Diagnosis not present

## 2017-06-15 DIAGNOSIS — F17290 Nicotine dependence, other tobacco product, uncomplicated: Secondary | ICD-10-CM | POA: Diagnosis present

## 2017-06-15 DIAGNOSIS — Z4889 Encounter for other specified surgical aftercare: Secondary | ICD-10-CM | POA: Diagnosis not present

## 2017-06-15 DIAGNOSIS — T148XXA Other injury of unspecified body region, initial encounter: Secondary | ICD-10-CM | POA: Diagnosis not present

## 2017-06-15 DIAGNOSIS — S86811A Strain of other muscle(s) and tendon(s) at lower leg level, right leg, initial encounter: Secondary | ICD-10-CM | POA: Diagnosis not present

## 2017-06-15 DIAGNOSIS — S79911A Unspecified injury of right hip, initial encounter: Secondary | ICD-10-CM | POA: Diagnosis not present

## 2017-06-15 DIAGNOSIS — S76111S Strain of right quadriceps muscle, fascia and tendon, sequela: Secondary | ICD-10-CM | POA: Diagnosis not present

## 2017-06-15 DIAGNOSIS — M1A0720 Idiopathic chronic gout, left ankle and foot, without tophus (tophi): Secondary | ICD-10-CM | POA: Diagnosis present

## 2017-06-15 DIAGNOSIS — Z79899 Other long term (current) drug therapy: Secondary | ICD-10-CM | POA: Diagnosis not present

## 2017-06-15 DIAGNOSIS — K5900 Constipation, unspecified: Secondary | ICD-10-CM | POA: Diagnosis not present

## 2017-06-15 DIAGNOSIS — Z833 Family history of diabetes mellitus: Secondary | ICD-10-CM | POA: Diagnosis not present

## 2017-06-15 DIAGNOSIS — M79609 Pain in unspecified limb: Secondary | ICD-10-CM | POA: Diagnosis not present

## 2017-06-15 DIAGNOSIS — R296 Repeated falls: Secondary | ICD-10-CM

## 2017-06-15 DIAGNOSIS — M1A0110 Idiopathic chronic gout, right shoulder, without tophus (tophi): Secondary | ICD-10-CM | POA: Diagnosis present

## 2017-06-15 DIAGNOSIS — M109 Gout, unspecified: Secondary | ICD-10-CM | POA: Diagnosis present

## 2017-06-15 DIAGNOSIS — G8918 Other acute postprocedural pain: Secondary | ICD-10-CM

## 2017-06-15 DIAGNOSIS — S76112A Strain of left quadriceps muscle, fascia and tendon, initial encounter: Principal | ICD-10-CM | POA: Diagnosis present

## 2017-06-15 DIAGNOSIS — Z8249 Family history of ischemic heart disease and other diseases of the circulatory system: Secondary | ICD-10-CM | POA: Diagnosis not present

## 2017-06-15 DIAGNOSIS — M1A00X0 Idiopathic chronic gout, unspecified site, without tophus (tophi): Secondary | ICD-10-CM | POA: Diagnosis not present

## 2017-06-15 DIAGNOSIS — Q231 Congenital insufficiency of aortic valve: Secondary | ICD-10-CM | POA: Diagnosis not present

## 2017-06-15 DIAGNOSIS — Z820 Family history of epilepsy and other diseases of the nervous system: Secondary | ICD-10-CM | POA: Diagnosis not present

## 2017-06-15 DIAGNOSIS — F419 Anxiety disorder, unspecified: Secondary | ICD-10-CM | POA: Diagnosis present

## 2017-06-15 DIAGNOSIS — M25551 Pain in right hip: Secondary | ICD-10-CM | POA: Diagnosis not present

## 2017-06-15 DIAGNOSIS — W19XXXD Unspecified fall, subsequent encounter: Secondary | ICD-10-CM | POA: Diagnosis not present

## 2017-06-15 DIAGNOSIS — M25569 Pain in unspecified knee: Secondary | ICD-10-CM | POA: Diagnosis not present

## 2017-06-15 DIAGNOSIS — R319 Hematuria, unspecified: Secondary | ICD-10-CM | POA: Diagnosis not present

## 2017-06-15 DIAGNOSIS — H9190 Unspecified hearing loss, unspecified ear: Secondary | ICD-10-CM | POA: Diagnosis present

## 2017-06-15 DIAGNOSIS — R269 Unspecified abnormalities of gait and mobility: Secondary | ICD-10-CM | POA: Diagnosis not present

## 2017-06-15 DIAGNOSIS — S76112D Strain of left quadriceps muscle, fascia and tendon, subsequent encounter: Secondary | ICD-10-CM | POA: Diagnosis not present

## 2017-06-15 DIAGNOSIS — G479 Sleep disorder, unspecified: Secondary | ICD-10-CM | POA: Diagnosis not present

## 2017-06-15 DIAGNOSIS — Z862 Personal history of diseases of the blood and blood-forming organs and certain disorders involving the immune mechanism: Secondary | ICD-10-CM | POA: Diagnosis not present

## 2017-06-15 DIAGNOSIS — E860 Dehydration: Secondary | ICD-10-CM | POA: Diagnosis present

## 2017-06-15 DIAGNOSIS — M25562 Pain in left knee: Secondary | ICD-10-CM | POA: Diagnosis not present

## 2017-06-15 DIAGNOSIS — K219 Gastro-esophageal reflux disease without esophagitis: Secondary | ICD-10-CM | POA: Diagnosis present

## 2017-06-15 DIAGNOSIS — G4700 Insomnia, unspecified: Secondary | ICD-10-CM | POA: Diagnosis present

## 2017-06-15 DIAGNOSIS — K449 Diaphragmatic hernia without obstruction or gangrene: Secondary | ICD-10-CM | POA: Diagnosis present

## 2017-06-15 DIAGNOSIS — M25561 Pain in right knee: Secondary | ICD-10-CM | POA: Diagnosis not present

## 2017-06-15 DIAGNOSIS — K5909 Other constipation: Secondary | ICD-10-CM | POA: Diagnosis not present

## 2017-06-15 DIAGNOSIS — Z8679 Personal history of other diseases of the circulatory system: Secondary | ICD-10-CM

## 2017-06-15 DIAGNOSIS — Z8739 Personal history of other diseases of the musculoskeletal system and connective tissue: Secondary | ICD-10-CM | POA: Diagnosis not present

## 2017-06-15 DIAGNOSIS — B379 Candidiasis, unspecified: Secondary | ICD-10-CM | POA: Diagnosis present

## 2017-06-15 HISTORY — PX: QUADRICEPS TENDON REPAIR: SHX756

## 2017-06-15 HISTORY — DX: Gastro-esophageal reflux disease without esophagitis: K21.9

## 2017-06-15 HISTORY — DX: Personal history of other diseases of the digestive system: Z87.19

## 2017-06-15 HISTORY — DX: Malignant (primary) neoplasm, unspecified: C80.1

## 2017-06-15 HISTORY — DX: Unspecified osteoarthritis, unspecified site: M19.90

## 2017-06-15 LAB — COMPREHENSIVE METABOLIC PANEL
ALT: 32 U/L (ref 17–63)
ANION GAP: 10 (ref 5–15)
AST: 29 U/L (ref 15–41)
Albumin: 4 g/dL (ref 3.5–5.0)
Alkaline Phosphatase: 56 U/L (ref 38–126)
BUN: 11 mg/dL (ref 6–20)
CHLORIDE: 104 mmol/L (ref 101–111)
CO2: 25 mmol/L (ref 22–32)
CREATININE: 1.01 mg/dL (ref 0.61–1.24)
Calcium: 9.2 mg/dL (ref 8.9–10.3)
Glucose, Bld: 99 mg/dL (ref 65–99)
POTASSIUM: 3.5 mmol/L (ref 3.5–5.1)
SODIUM: 139 mmol/L (ref 135–145)
Total Bilirubin: 0.6 mg/dL (ref 0.3–1.2)
Total Protein: 6.9 g/dL (ref 6.5–8.1)

## 2017-06-15 LAB — CBC
HEMATOCRIT: 45.2 % (ref 39.0–52.0)
HEMOGLOBIN: 15.3 g/dL (ref 13.0–17.0)
MCH: 31.7 pg (ref 26.0–34.0)
MCHC: 33.8 g/dL (ref 30.0–36.0)
MCV: 93.6 fL (ref 78.0–100.0)
Platelets: 191 10*3/uL (ref 150–400)
RBC: 4.83 MIL/uL (ref 4.22–5.81)
RDW: 13.7 % (ref 11.5–15.5)
WBC: 9.3 10*3/uL (ref 4.0–10.5)

## 2017-06-15 SURGERY — REPAIR, TENDON, QUADRICEPS
Anesthesia: General | Laterality: Bilateral

## 2017-06-15 MED ORDER — LIDOCAINE HCL (CARDIAC) 20 MG/ML IV SOLN
INTRAVENOUS | Status: DC | PRN
  Administered 2017-06-15: 60 mg via INTRAVENOUS

## 2017-06-15 MED ORDER — LACTATED RINGERS IV SOLN
INTRAVENOUS | Status: DC | PRN
  Administered 2017-06-15 (×2): via INTRAVENOUS

## 2017-06-15 MED ORDER — MIDAZOLAM HCL 2 MG/2ML IJ SOLN
INTRAMUSCULAR | Status: AC
  Administered 2017-06-15: 1 mg via INTRAVENOUS
  Filled 2017-06-15: qty 2

## 2017-06-15 MED ORDER — MORPHINE SULFATE (PF) 4 MG/ML IV SOLN
4.0000 mg | Freq: Once | INTRAVENOUS | Status: AC
  Administered 2017-06-15: 4 mg via INTRAVENOUS
  Filled 2017-06-15: qty 1

## 2017-06-15 MED ORDER — FENTANYL CITRATE (PF) 100 MCG/2ML IJ SOLN
50.0000 ug | Freq: Once | INTRAMUSCULAR | Status: AC
  Administered 2017-06-15: 50 ug via INTRAVENOUS
  Filled 2017-06-15: qty 1

## 2017-06-15 MED ORDER — CHLORHEXIDINE GLUCONATE 4 % EX LIQD: 60.0000 mL | Freq: Once | CUTANEOUS | Status: DC

## 2017-06-15 MED ORDER — FENTANYL CITRATE (PF) 100 MCG/2ML IJ SOLN
INTRAMUSCULAR | Status: DC | PRN
  Administered 2017-06-15: 50 ug via INTRAVENOUS
  Administered 2017-06-15: 100 ug via INTRAVENOUS

## 2017-06-15 MED ORDER — OXYCODONE HCL 5 MG/5ML PO SOLN: 5.0000 mg | Freq: Once | ORAL | Status: DC | PRN

## 2017-06-15 MED ORDER — FENTANYL CITRATE (PF) 100 MCG/2ML IJ SOLN
INTRAMUSCULAR | Status: AC
  Administered 2017-06-15: 50 ug via INTRAVENOUS
  Filled 2017-06-15: qty 2

## 2017-06-15 MED ORDER — LORAZEPAM 0.5 MG PO TABS
0.5000 mg | ORAL_TABLET | Freq: Once | ORAL | Status: AC
  Administered 2017-06-15: 0.5 mg via ORAL
  Filled 2017-06-15: qty 1

## 2017-06-15 MED ORDER — POTASSIUM CHLORIDE IN NACL 20-0.9 MEQ/L-% IV SOLN
INTRAVENOUS | Status: AC
  Administered 2017-06-15: 23:00:00 via INTRAVENOUS
  Filled 2017-06-15: qty 1000

## 2017-06-15 MED ORDER — FENTANYL CITRATE (PF) 100 MCG/2ML IJ SOLN
50.0000 ug | Freq: Once | INTRAMUSCULAR | Status: DC
  Administered 2017-06-15: 50 ug via INTRAVENOUS

## 2017-06-15 MED ORDER — METOPROLOL SUCCINATE ER 25 MG PO TB24
ORAL_TABLET | ORAL | Status: AC
  Filled 2017-06-15: qty 1

## 2017-06-15 MED ORDER — ONDANSETRON HCL 4 MG/2ML IJ SOLN: 4.0000 mg | Freq: Once | INTRAMUSCULAR | Status: DC | PRN

## 2017-06-15 MED ORDER — PROPOFOL 10 MG/ML IV BOLUS
INTRAVENOUS | Status: DC | PRN
Start: 2017-06-15 — End: 2017-06-15
  Administered 2017-06-15: 180 mg via INTRAVENOUS

## 2017-06-15 MED ORDER — POVIDONE-IODINE 10 % EX SWAB: 2.0000 "application " | Freq: Once | CUTANEOUS | Status: DC

## 2017-06-15 MED ORDER — FENTANYL CITRATE (PF) 100 MCG/2ML IJ SOLN
50.0000 ug | Freq: Once | INTRAMUSCULAR | Status: AC
  Administered 2017-06-15: 50 ug via INTRAVENOUS

## 2017-06-15 MED ORDER — HYDROMORPHONE HCL 1 MG/ML IJ SOLN: 0.2500 mg | INTRAMUSCULAR | Status: DC | PRN

## 2017-06-15 MED ORDER — CEFAZOLIN SODIUM-DEXTROSE 2-4 GM/100ML-% IV SOLN
2.0000 g | INTRAVENOUS | Status: AC
Start: 2017-06-15 — End: 2017-06-15
  Administered 2017-06-15: 2 g via INTRAVENOUS
  Filled 2017-06-15: qty 100

## 2017-06-15 MED ORDER — FENTANYL CITRATE (PF) 250 MCG/5ML IJ SOLN
INTRAMUSCULAR | Status: AC
  Filled 2017-06-15: qty 5

## 2017-06-15 MED ORDER — ASPIRIN 325 MG PO TABS
325.0000 mg | ORAL_TABLET | Freq: Once | ORAL | Status: AC
  Administered 2017-06-15: 325 mg via ORAL
  Filled 2017-06-15: qty 1

## 2017-06-15 MED ORDER — FENTANYL CITRATE (PF) 100 MCG/2ML IJ SOLN
50.0000 ug | Freq: Once | INTRAMUSCULAR | Status: DC
  Filled 2017-06-15 (×2): qty 2
  Filled 2017-06-15: qty 1

## 2017-06-15 MED ORDER — LACTATED RINGERS IV SOLN
INTRAVENOUS | Status: DC
  Administered 2017-06-15: 17:00:00 via INTRAVENOUS

## 2017-06-15 MED ORDER — MIDAZOLAM HCL 2 MG/2ML IJ SOLN
INTRAMUSCULAR | Status: AC
  Filled 2017-06-15: qty 2

## 2017-06-15 MED ORDER — PROPOFOL 10 MG/ML IV BOLUS
INTRAVENOUS | Status: AC
  Filled 2017-06-15: qty 20

## 2017-06-15 MED ORDER — METOPROLOL SUCCINATE ER 25 MG PO TB24
25.0000 mg | ORAL_TABLET | Freq: Once | ORAL | Status: AC
  Administered 2017-06-15: 25 mg via ORAL
  Filled 2017-06-15: qty 1

## 2017-06-15 MED ORDER — MIDAZOLAM HCL 2 MG/2ML IJ SOLN
1.0000 mg | Freq: Once | INTRAMUSCULAR | Status: AC
  Administered 2017-06-15: 1 mg via INTRAVENOUS

## 2017-06-15 MED ORDER — OXYCODONE HCL 5 MG PO TABS: 5.0000 mg | ORAL_TABLET | Freq: Once | ORAL | Status: DC | PRN

## 2017-06-15 MED ORDER — MIDAZOLAM HCL 5 MG/5ML IJ SOLN
INTRAMUSCULAR | Status: DC | PRN
  Administered 2017-06-15: 2 mg via INTRAVENOUS

## 2017-06-15 MED ORDER — ASPIRIN 325 MG PO TABS
325.0000 mg | ORAL_TABLET | Freq: Every day | ORAL | Status: DC
  Administered 2017-06-16 – 2017-06-19 (×4): 325 mg via ORAL
  Filled 2017-06-15 (×5): qty 1

## 2017-06-15 MED ORDER — ONDANSETRON HCL 4 MG/2ML IJ SOLN
INTRAMUSCULAR | Status: DC | PRN
  Administered 2017-06-15: 4 mg via INTRAVENOUS

## 2017-06-15 SURGICAL SUPPLY — 83 items
BANDAGE ACE 4X5 VEL STRL LF (GAUZE/BANDAGES/DRESSINGS) ×2 IMPLANT
BANDAGE ACE 6X5 VEL STRL LF (GAUZE/BANDAGES/DRESSINGS) ×2 IMPLANT
BANDAGE ESMARK 6X9 LF (GAUZE/BANDAGES/DRESSINGS) ×1 IMPLANT
BIT DRILL 5/64X5 DISP (BIT) ×2 IMPLANT
BLADE SURG 10 STRL SS (BLADE) ×2 IMPLANT
BNDG CMPR 9X6 STRL LF SNTH (GAUZE/BANDAGES/DRESSINGS) ×1
BNDG CMPR MED 15X6 ELC VLCR LF (GAUZE/BANDAGES/DRESSINGS) ×2
BNDG COHESIVE 4X5 TAN STRL (GAUZE/BANDAGES/DRESSINGS) ×2 IMPLANT
BNDG ELASTIC 6X15 VLCR STRL LF (GAUZE/BANDAGES/DRESSINGS) ×2 IMPLANT
BNDG ESMARK 6X9 LF (GAUZE/BANDAGES/DRESSINGS) ×2
BNDG GAUZE ELAST 4 BULKY (GAUZE/BANDAGES/DRESSINGS) ×4 IMPLANT
COVER MAYO STAND STRL (DRAPES) ×2 IMPLANT
COVER SURGICAL LIGHT HANDLE (MISCELLANEOUS) ×2 IMPLANT
CUFF TOURNIQUET SINGLE 24IN (TOURNIQUET CUFF) IMPLANT
CUFF TOURNIQUET SINGLE 34IN LL (TOURNIQUET CUFF) ×2 IMPLANT
CUFF TOURNIQUET SINGLE 44IN (TOURNIQUET CUFF) IMPLANT
DRAPE IMP U-DRAPE 54X76 (DRAPES) ×1 IMPLANT
DRAPE INCISE IOBAN 66X45 STRL (DRAPES) ×4 IMPLANT
DRAPE U-SHAPE 47X51 STRL (DRAPES) ×2 IMPLANT
DRILL BIT 7/64X5 (BIT) IMPLANT
DRSG AQUACEL AG ADV 3.5X10 (GAUZE/BANDAGES/DRESSINGS) ×2 IMPLANT
DRSG PAD ABDOMINAL 8X10 ST (GAUZE/BANDAGES/DRESSINGS) ×4 IMPLANT
DURAPREP 26ML APPLICATOR (WOUND CARE) ×3 IMPLANT
ELECT REM PT RETURN 9FT ADLT (ELECTROSURGICAL) ×2
ELECTRODE REM PT RTRN 9FT ADLT (ELECTROSURGICAL) ×1 IMPLANT
GAUZE SPONGE 4X4 12PLY STRL (GAUZE/BANDAGES/DRESSINGS) ×2 IMPLANT
GAUZE XEROFORM 5X9 LF (GAUZE/BANDAGES/DRESSINGS) ×2 IMPLANT
GLOVE BIO SURGEON STRL SZ8 (GLOVE) ×1 IMPLANT
GLOVE BIOGEL PI IND STRL 6.5 (GLOVE) IMPLANT
GLOVE BIOGEL PI IND STRL 8 (GLOVE) ×1 IMPLANT
GLOVE BIOGEL PI INDICATOR 6.5 (GLOVE) ×1
GLOVE BIOGEL PI INDICATOR 8 (GLOVE) ×1
GLOVE SURG ORTHO 8.0 STRL STRW (GLOVE) ×2 IMPLANT
GOWN STRL REUS W/ TWL LRG LVL3 (GOWN DISPOSABLE) ×1 IMPLANT
GOWN STRL REUS W/ TWL XL LVL3 (GOWN DISPOSABLE) ×1 IMPLANT
GOWN STRL REUS W/TWL LRG LVL3 (GOWN DISPOSABLE) ×2
GOWN STRL REUS W/TWL XL LVL3 (GOWN DISPOSABLE) ×2
IMMOBILIZER KNEE 22 UNIV (SOFTGOODS) ×2 IMPLANT
IMMOBILIZER KNEE 24 THIGH 36 (MISCELLANEOUS) IMPLANT
IMMOBILIZER KNEE 24 UNIV (MISCELLANEOUS) ×2
KIT BASIN OR (CUSTOM PROCEDURE TRAY) ×2 IMPLANT
KIT ROOM TURNOVER OR (KITS) ×2 IMPLANT
MANIFOLD NEPTUNE II (INSTRUMENTS) ×2 IMPLANT
NDL 18GX1X1/2 (RX/OR ONLY) (NEEDLE) ×1 IMPLANT
NDL SPNL 18GX3.5 QUINCKE PK (NEEDLE) ×1 IMPLANT
NDL TAPERED W/ NITINOL LOOP (MISCELLANEOUS) IMPLANT
NEEDLE 18GX1X1/2 (RX/OR ONLY) (NEEDLE) ×2 IMPLANT
NEEDLE SPNL 18GX3.5 QUINCKE PK (NEEDLE) ×2 IMPLANT
NEEDLE TAPERED W/ NITINOL LOOP (MISCELLANEOUS) ×4 IMPLANT
NS IRRIG 1000ML POUR BTL (IV SOLUTION) ×2 IMPLANT
PACK ORTHO EXTREMITY (CUSTOM PROCEDURE TRAY) ×2 IMPLANT
PAD ARMBOARD 7.5X6 YLW CONV (MISCELLANEOUS) ×4 IMPLANT
PAD CAST 4YDX4 CTTN HI CHSV (CAST SUPPLIES) ×1 IMPLANT
PADDING CAST COTTON 4X4 STRL (CAST SUPPLIES) ×6
PADDING CAST COTTON 6X4 STRL (CAST SUPPLIES) ×3 IMPLANT
PASSER SUT SWANSON 36MM LOOP (INSTRUMENTS) ×2 IMPLANT
PENCIL BUTTON HOLSTER BLD 10FT (ELECTRODE) ×2 IMPLANT
RETRIEVER SUT HEWSON (MISCELLANEOUS) ×1 IMPLANT
SPONGE LAP 18X18 X RAY DECT (DISPOSABLE) ×4 IMPLANT
STAPLER VISISTAT 35W (STAPLE) ×2 IMPLANT
STOCKINETTE IMPERVIOUS 9X36 MD (GAUZE/BANDAGES/DRESSINGS) ×2 IMPLANT
STRIP CLOSURE SKIN 1/2X4 (GAUZE/BANDAGES/DRESSINGS) ×2 IMPLANT
SUT FIBERWIRE #2 38 T-5 BLUE (SUTURE)
SUT MNCRL AB 3-0 PS2 18 (SUTURE) ×2 IMPLANT
SUT VIC AB 0 CT1 27 (SUTURE) ×4
SUT VIC AB 0 CT1 27XBRD ANBCTR (SUTURE) ×2 IMPLANT
SUT VIC AB 0 CT2 27 (SUTURE) ×2 IMPLANT
SUT VIC AB 1 CT1 27 (SUTURE) ×8
SUT VIC AB 1 CT1 27XBRD ANBCTR (SUTURE) ×1 IMPLANT
SUT VIC AB 2-0 CT1 27 (SUTURE) ×8
SUT VIC AB 2-0 CT1 36 (SUTURE) ×1 IMPLANT
SUT VIC AB 2-0 CT1 TAPERPNT 27 (SUTURE) ×2 IMPLANT
SUT VIC AB 2-0 SH 27 (SUTURE) ×2
SUT VIC AB 2-0 SH 27XBRD (SUTURE) IMPLANT
SUT VICRYL AB 2 0 TIES (SUTURE) ×2 IMPLANT
SUTURE FIBERWR #2 38 T-5 BLUE (SUTURE) IMPLANT
SYR CONTROL 10ML LL (SYRINGE) ×2 IMPLANT
SYR TB 1ML LUER SLIP (SYRINGE) ×2 IMPLANT
TOWEL GREEN STERILE FF (TOWEL DISPOSABLE) ×1 IMPLANT
TOWEL OR 17X26 10 PK STRL BLUE (TOWEL DISPOSABLE) ×2 IMPLANT
TUBE CONNECTING 12X1/4 (SUCTIONS) ×2 IMPLANT
WATER STERILE IRR 1000ML POUR (IV SOLUTION) ×2 IMPLANT
YANKAUER SUCT BULB TIP NO VENT (SUCTIONS) ×2 IMPLANT

## 2017-06-15 NOTE — H&P (Signed)
Jose [name] is an 66 y.o. male.   Chief Complaint: Bilateral knee pain HPI: Jose [name] is a [age] patient with bilateral knee pain.  He fell today.  He has a history of partial quad rupture on the left but was very functional with that leg.  Currently the patient reports inability to extend either knee.  No family history or personal history of DVT or pulmonary embolism.  He does have stairs at home.  Past Medical History:  Diagnosis Date  . Arthritis    R shoulder, great toes, hands- has gout & has injections in knees with Dr. Estanislado Pandy   . Cancer ([place])    skin ca-   . GERD (gastroesophageal reflux disease)   . History of hiatal hernia   . SDH (subdural hematoma) (HCC)     Past Surgical History:  Procedure Laterality Date  . BRAIN SURGERY  2016   evacuation of SDH  . CARPAL TUNNEL RELEASE Bilateral   . GREEN LIGHT LASER TURP (TRANSURETHRAL RESECTION OF PROSTATE  04/27/2017  . SHOULDER SURGERY Right     History reviewed. No pertinent family history. Social History:  reports that he has been smoking Cigars.  He has never used smokeless tobacco. He reports that he drinks about 1.8 oz of alcohol per week . He reports that he does not use drugs.  Allergies: No Known Allergies  Medications Prior to Admission  Medication Sig Dispense Refill  . acetaminophen (TYLENOL) 500 MG tablet Take 500-1,000 mg by mouth every 6 (six) hours as needed for mild pain.    Marland Kitchen allopurinol (ZYLOPRIM) 300 MG tablet TAKE 1 TABLET BY MOUTH EVERY DAY 90 tablet 1  . colchicine 0.6 MG tablet Take 1 tablet (0.6 mg total) by mouth daily as needed (FOR GOUT FLAREUPS). 30 tablet 3  . esomeprazole (NEXIUM) 40 MG capsule Take 1 capsule (40 mg total) by mouth daily. 30 capsule 0  . metoprolol succinate (TOPROL-XL) 25 MG 24 hr tablet Take 1 tablet (25 mg total) by mouth daily. Take with or immediately following a meal. 90 tablet 3  . Multiple Vitamin (MULTIVITAMIN WITH MINERALS) TABS tablet Take 1 tablet by mouth daily.     . cephALEXin (KEFLEX) 500 MG capsule Take 1 capsule (500 mg total) by mouth 4 (four) times daily. (Patient not taking: Reported on 06/15/2017) 20 capsule 0    Results for orders placed or performed during the hospital encounter of 06/15/17 (from the past 48 hour(s))  Comprehensive metabolic panel     Status: None   Collection Time: 06/15/17  4:45 PM  Result Value Ref Range   Sodium 139 135 - 145 mmol/L   Potassium 3.5 3.5 - 5.1 mmol/L   Chloride 104 101 - 111 mmol/L   CO2 25 22 - 32 mmol/L   Glucose, Bld 99 65 - 99 mg/dL   BUN 11 6 - 20 mg/dL   Creatinine, Ser 1.01 0.61 - 1.24 mg/dL   Calcium 9.2 8.9 - 10.3 mg/dL   Total Protein 6.9 6.5 - 8.1 g/dL   Albumin 4.0 3.5 - 5.0 g/dL   AST 29 15 - 41 U/L   ALT 32 17 - 63 U/L   Alkaline Phosphatase 56 38 - 126 U/L   Total Bilirubin 0.6 0.3 - 1.2 mg/dL   GFR calc non Af Amer >60 >60 mL/min   GFR calc Af Amer >60 >60 mL/min    Comment: (NOTE) The eGFR has been calculated using the CKD EPI equation. This calculation has  not been validated in all clinical situations. eGFR's persistently <60 mL/min signify possible Chronic Kidney Disease.    Anion gap 10 5 - 15  CBC     Status: None   Collection Time: 06/15/17  4:45 PM  Result Value Ref Range   WBC 9.3 4.0 - 10.5 K/uL   RBC 4.83 4.22 - 5.81 MIL/uL   Hemoglobin 15.3 13.0 - 17.0 g/dL   HCT 45.2 39.0 - 52.0 %   MCV 93.6 78.0 - 100.0 fL   MCH 31.7 26.0 - 34.0 pg   MCHC 33.8 30.0 - 36.0 g/dL   RDW 13.7 11.5 - 15.5 %   Platelets 191 150 - 400 K/uL   Mr Knee Right Wo Contrast  Result Date: 06/15/2017 CLINICAL DATA:  [place] on wet hardwood floor, then after arriving to ED, Pt found lying on R side on floor in Radiology. EXAM: MRI OF THE RIGHT KNEE WITHOUT CONTRAST TECHNIQUE: Multiplanar, multisequence MR imaging of the knee was performed. No intravenous contrast was administered. COMPARISON:  None. FINDINGS: MENISCI Medial meniscus: Small undersurface tear of the posterior horn of the medial  meniscus. Lateral meniscus:  Intact. LIGAMENTS Cruciates:  Intact ACL and PCL. Collaterals: Medial collateral ligament is intact. Lateral collateral ligament complex is intact. CARTILAGE Patellofemoral: Partial-thickness cartilage loss of the lateral patellofemoral compartment. Medial: Mild partial-thickness cartilage loss of the medial femorotibial compartment. Lateral: Partial-thickness cartilage loss of the lateral femorotibial compartment. Joint: Small joint effusion. Normal Hoffa's fat. No plical thickening. Popliteal Fossa: No significant Baker cyst. Intact popliteus tendon. Extensor Mechanism: Complete tear of the quadriceps tendon at its insertion with 10 mm of retraction. Intact patellar tendon. Intact medial and lateral patellar retinaculum. TT -TG distance 18 mm. Bones: Lateral patellar tilting. No acute osseous abnormality. No fracture or dislocation. No aggressive osseous lesion. Other: No fluid collection or hematoma. IMPRESSION: 1. Complete tear of the quadriceps tendon at its insertion with 10 mm of retraction. 2. Small undersurface tear of the posterior horn of the medial meniscus. Electronically Signed   By: Kathreen Devoid   On: 06/15/2017 14:21   Dg Knee Complete 4 Views Left  Result Date: 06/15/2017 CLINICAL DATA:  Fall today with bilateral knee pain, initial encounter EXAM: LEFT KNEE - COMPLETE 4+ VIEW COMPARISON:  04/26/17 FINDINGS: No acute fracture or dislocation is noted. Thinning of the quadriceps tendon is noted consistent with the patient's given clinical history. No joint effusion is seen. IMPRESSION: No acute abnormality noted. Electronically Signed   By: Inez Catalina M.D.   On: 06/15/2017 09:43   Dg Knee Complete 4 Views Right  Result Date: 06/15/2017 CLINICAL DATA:  Recent fall with right knee pain, initial encounter EXAM: RIGHT KNEE - COMPLETE 4+ VIEW COMPARISON:  None. FINDINGS: No evidence of fracture, dislocation, or joint effusion. No evidence of arthropathy or other focal  bone abnormality. Soft tissues are unremarkable. IMPRESSION: No acute abnormality noted. Electronically Signed   By: Inez Catalina M.D.   On: 06/15/2017 09:43   Dg Hip Unilat W Or Wo Pelvis 2-3 Views Right  Result Date: 06/15/2017 CLINICAL DATA:  Fall at home this a.m landing onto bilat knees and then fell in ED transferring from bed to w/c complains of generalized rt hip pain, ltd rom due to knee pain EXAM: DG HIP (WITH OR WITHOUT PELVIS) 2-3V RIGHT COMPARISON:  None. FINDINGS: Hips are located. No evidence of pelvic fracture or sacral fracture. Dedicated view of the RIGHT hip demonstrates no femoral neck fracture. IMPRESSION: No pelvic  fracture or RIGHT hip fracture Electronically Signed   By: Suzy Bouchard M.D.   On: 06/15/2017 11:17    Review of Systems  Musculoskeletal: Positive for joint pain.  All other systems reviewed and are negative.   Blood pressure (!) 132/94, pulse 87, temperature 97.4 F (36.3 C), temperature source Oral, resp. rate 16, SpO2 98 %. Physical Exam  Constitutional: He appears well-developed.  HENT:  Head: Normocephalic.  Eyes: Pupils are equal, round, and reactive to light.  Neck: Normal range of motion.  Cardiovascular: Normal rate.   Respiratory: Effort normal.  Neurological: He is alert.  Skin: Skin is warm.  Psychiatric: He has a normal mood and affect.   Bilateral knee exam demonstrates palpable defect above the patella.  There is no active extension in either leg.  Collaterals are stable bilaterally.  Pedal pulses palpable.  Assessment/Plan Impression is bilateral quad tendon rupture.  Patient had previous partial quad tendon rupture sustained 3-4 months ago.  That has been completed.  Plan at this time is for bilateral quad tendon rupture repair.  Risks and benefits are discussed.  Patient will need to be in the hospital likely 1-2 nights for rehabilitation.  We'll see him back in clinic in 7-10 days after surgery.  Anticipate he will be able to be  weightbearing as tolerated but in the locked knee immobilizers.  Anderson Malta, MD 06/15/2017, 5:49 PM

## 2017-06-15 NOTE — ED Triage Notes (Signed)
Per EMS pt complaint of bilateral knee pain post falling on wet hardwood floor; pt verbalizes unable to straighten knees.

## 2017-06-15 NOTE — ED Notes (Signed)
Pt reports increasing pain and may need medication to reduce anxiety for the MRI.  Gerald Stabs, [place] made aware.

## 2017-06-15 NOTE — Progress Notes (Signed)
My initial thought for DVT prophylaxis was to place the patient on Xarelto.  However he does have a history of subdural hematoma.  Plan at this time is to start him on aspirin tomorrow and we will check him for DVT with ultrasound prior to discharge on Sunday.  Continue with SCDs.

## 2017-06-15 NOTE — Anesthesia Procedure Notes (Signed)
Procedure Name: LMA Insertion Date/Time: 06/15/2017 8:44 PM Performed by: Manus Gunning, Kamilla Hands J Pre-anesthesia Checklist: Patient identified, Emergency Drugs available, Suction available, Patient being monitored and Timeout performed Patient Re-evaluated:Patient Re-evaluated prior to inductionOxygen Delivery Method: Circle system utilized Intubation Type: IV induction Ventilation: Mask ventilation without difficulty LMA: LMA inserted LMA Size: 5.0 Number of attempts: 1 Placement Confirmation: positive ETCO2 and breath sounds checked- equal and bilateral Tube secured with: Tape Dental Injury: Teeth and Oropharynx as per pre-operative assessment

## 2017-06-15 NOTE — ED Provider Notes (Signed)
[facility] DEPT Provider Note   CSN: [id number] Arrival date & time: 06/15/17  7035     History   Chief Complaint Chief Complaint  Patient presents with  . Fall  . Knee Pain    HPI Jose [name] is a 66 y.o. male.  HPI Patient presents to the emergency department with injuries following a fall.  Patient states he was walking this morning when his left knee gave out and he fell, landing on both knees.  The patient states he is having severe right and left knee pain.  States that he is unable to walk.  The patient states he partially tore his quadriceps tendon following a fall several months ago.  He was seen by an orthopedist.  They stated that he most likely need surgical intervention to repair the tendon.  Patient has not seen them to schedule the surgery.  Patient states that his right knee feels similar to his left.  Patient denies any other injuries.  Patient has headache, blurred vision, dysphagia, shortness of breath, syncope, syncope, or loss of consciousness.  Past Medical History:  Diagnosis Date  . SDH (subdural hematoma) [facility])     Patient Active Problem List   Diagnosis Date Noted  . Quadriceps tendon rupture, left, initial encounter 05/17/2017  . Primary osteoarthritis of both knees 02/28/2017  . Primary osteoarthritis of both hands 02/28/2017  . Uricacidemia 02/28/2017  . Pain in joint of left knee 02/07/2017  . Idiopathic chronic gout, unspecified site, without tophus (tophi) 11/29/2016  . HEMATURIA UNSPECIFIED 01/25/2009  . ABDOMINAL PAIN 01/25/2009  . XERODERMA 03/10/2008  . UNS ADVRS EFF UNS RX MEDICINAL&BIOLOGICAL SBSTNC 03/10/2008  . ADJUSTMENT REACTION WITH PHYSICAL SYMPTOMS 02/14/2008  . MUSCLE SPASM 02/14/2008  . ACUTE PROSTATITIS 12/16/2007  . BREAST LUMP OR MASS, RIGHT 07/12/2007  . H/O: RCT (rotator cuff tear) 01/13/2007  . GOUT 01/08/2007    Past Surgical History:  Procedure Laterality Date  . CARPAL TUNNEL RELEASE    . GREEN LIGHT  LASER TURP (TRANSURETHRAL RESECTION OF PROSTATE  04/27/2017  . SHOULDER SURGERY         Home Medications    Prior to Admission medications   Medication Sig Start Date End Date Taking? Authorizing Provider  acetaminophen (TYLENOL) 500 MG tablet Take 500-1,000 mg by mouth every 6 (six) hours as needed for mild pain.   Yes [provider]  allopurinol (ZYLOPRIM) 300 MG tablet TAKE 1 TABLET BY MOUTH EVERY DAY 06/04/17  Yes Panwala, Naitik, PA-C  colchicine 0.6 MG tablet Take 1 tablet (0.6 mg total) by mouth daily as needed (FOR GOUT FLAREUPS). 12/01/16  Yes Panwala, Naitik, PA-C  esomeprazole (NEXIUM) 40 MG capsule Take 1 capsule (40 mg total) by mouth daily. 04/12/17  Yes Robyn Haber, MD  metoprolol succinate (TOPROL-XL) 25 MG 24 hr tablet Take 1 tablet (25 mg total) by mouth daily. Take with or immediately following a meal. 02/07/17 06/15/17 Yes Josue Hector, MD  Multiple Vitamin (MULTIVITAMIN WITH MINERALS) TABS tablet Take 1 tablet by mouth daily.   Yes [provider]  cephALEXin (KEFLEX) 500 MG capsule Take 1 capsule (500 mg total) by mouth 4 (four) times daily. Patient not taking: Reported on 06/15/2017 05/14/17   Barnet Glasgow, NP    Family History No family history on file.  Social History Social History  Substance Use Topics  . Smoking status: Never Smoker  . Smokeless tobacco: Never Used  . Alcohol use 1.8 oz/week    2 Glasses of  wine, 1 Shots of liquor per week     Allergies   Patient has no known allergies.   Review of Systems Review of Systems All other systems negative except as documented in the HPI. All pertinent positives and negatives as reviewed in the HPI.  Physical Exam Updated Vital Signs BP (!) 149/103 (BP Location: Left Arm)   Pulse 68   Temp 97.4 F (36.3 C) (Oral)   Resp 16   SpO2 100%   Physical Exam  Constitutional: He is oriented to person, place, and time. He appears well-developed and well-nourished. No distress.    HENT:  Head: Normocephalic and atraumatic.  Mouth/Throat: Oropharynx is clear and moist.  Eyes: Pupils are equal, round, and reactive to light.  Neck: Normal range of motion. Neck supple.  Cardiovascular: Normal rate, regular rhythm and normal heart sounds.  Exam reveals no gallop and no friction rub.   No murmur heard. Pulmonary/Chest: Effort normal and breath sounds normal. No respiratory distress. He has no wheezes.  Musculoskeletal:       Right knee: He exhibits decreased range of motion. He exhibits no swelling, no effusion, no ecchymosis, no erythema and no bony tenderness. Tenderness found.       Legs: Neurological: He is alert and oriented to person, place, and time. He exhibits normal muscle tone. Coordination normal.  Skin: Skin is warm and dry. Capillary refill takes less than 2 seconds. No rash noted. No erythema.  Psychiatric: He has a normal mood and affect. His behavior is normal.  Nursing note and vitals reviewed.    ED Treatments / Results  Labs (all labs ordered are listed, but only abnormal results are displayed) Labs Reviewed - No data to display  EKG  EKG Interpretation None       Radiology Dg Knee Complete 4 Views Left  Result Date: 06/15/2017 CLINICAL DATA:  Fall today with bilateral knee pain, initial encounter EXAM: LEFT KNEE - COMPLETE 4+ VIEW COMPARISON:  04/26/17 FINDINGS: No acute fracture or dislocation is noted. Thinning of the quadriceps tendon is noted consistent with the patient's given clinical history. No joint effusion is seen. IMPRESSION: No acute abnormality noted. Electronically Signed   By: Inez Catalina M.D.   On: 06/15/2017 09:43   Dg Knee Complete 4 Views Right  Result Date: 06/15/2017 CLINICAL DATA:  Recent fall with right knee pain, initial encounter EXAM: RIGHT KNEE - COMPLETE 4+ VIEW COMPARISON:  None. FINDINGS: No evidence of fracture, dislocation, or joint effusion. No evidence of arthropathy or other focal bone abnormality. Soft  tissues are unremarkable. IMPRESSION: No acute abnormality noted. Electronically Signed   By: Inez Catalina M.D.   On: 06/15/2017 09:43   Dg Hip Unilat W Or Wo Pelvis 2-3 Views Right  Result Date: 06/15/2017 CLINICAL DATA:  Fall at home this a.m landing onto bilat knees and then fell in ED transferring from bed to w/c complains of generalized rt hip pain, ltd rom due to knee pain EXAM: DG HIP (WITH OR WITHOUT PELVIS) 2-3V RIGHT COMPARISON:  None. FINDINGS: Hips are located. No evidence of pelvic fracture or sacral fracture. Dedicated view of the RIGHT hip demonstrates no femoral neck fracture. IMPRESSION: No pelvic fracture or RIGHT hip fracture Electronically Signed   By: Suzy Bouchard M.D.   On: 06/15/2017 11:17    Procedures Procedures (including critical care time)  Medications Ordered in ED Medications  morphine 4 MG/ML injection 4 mg (4 mg Intravenous Given 06/15/17 1117)  LORazepam (ATIVAN) tablet 0.5  mg (0.5 mg Oral Given 06/15/17 1310)  morphine 4 MG/ML injection 4 mg (4 mg Intravenous Given 06/15/17 1310)     Initial Impression / Assessment and Plan / ED Course  I have reviewed the triage vital signs and the nursing notes.  Pertinent labs & imaging results that were available during my care of the patient were reviewed by me and considered in my medical decision making (see chart for details).    I spoke with the patient's orthopedist, who requested we do an MRI of the right to look for quadricep tendon tear.  There is a defect noted at the insertion of the quadriceps tendon. Patient is unable to bear weight.I spoke with Dr. Marlou Sa, who will have the patient transferred to [facility] for surgical intervention  Final Clinical Impressions(s) / ED Diagnoses   Final diagnoses:  Fall    New Prescriptions New Prescriptions   No medications on file     Dalia Heading, PA-C 06/15/17 Wheatley, Cliford, MD 06/22/17 2340    Sherwood Gambler, MD 06/22/17 [phone]

## 2017-06-15 NOTE — Anesthesia Procedure Notes (Signed)
Anesthesia Regional Block: Femoral nerve block   Pre-Anesthetic Checklist: ,, timeout performed, Correct Patient, Correct Site, Correct Laterality, Correct Procedure, Correct Position, site marked, Risks and benefits discussed,  Surgical consent,  Pre-op evaluation,  At surgeon's request and post-op pain management  Laterality: Left  Prep: chloraprep       Needles:  Injection technique: Single-shot  Needle Type: Echogenic Stimulator Needle      Needle Gauge: 21     Additional Needles:   Procedures: ultrasound guided,,,,,,,,  Narrative:  Start time: 06/15/2017 6:55 PM End time: 06/15/2017 7:00 PM Injection made incrementally with aspirations every 5 mL.  Performed by: Personally   Additional Notes: 30 cc 1:1 0.%% Bupivacaine with 1:200 epi and 0.5% Naropin injected easily

## 2017-06-15 NOTE — Brief Op Note (Signed)
06/15/2017  10:43 PM  PATIENT:  Jose [name]  66 y.o. male  PRE-OPERATIVE DIAGNOSIS:  Quadricep tendon repair   POST-OPERATIVE DIAGNOSIS:  QUADRICEP TENDON RUPTURE  PROCEDURE:  Procedure(s): REPAIR QUADRICEP TENDON  SURGEON:  Surgeon(s): Meredith Pel, MD  ASSISTANT: Darlen Round rnfa  ANESTHESIA:   regional and general  EBL: 50 ml    Total I/O In: 1000 [I.V.:1000] Out: -   BLOOD ADMINISTERED: none  DRAINS: none   LOCAL MEDICATIONS USED:  none  SPECIMEN:  No Specimen  COUNTS:  YES  TOURNIQUET:   Total Tourniquet Time Documented: Thigh (Right) - 31 minutes Thigh (Right) - 39 minutes Total: Thigh (Right) - 70 minutes   DICTATION: .Other Dictation: Dictation Number [id number]  PLAN OF CARE: Admit to inpatient     PATIENT DISPOSITION:  PACU - hemodynamically stable

## 2017-06-15 NOTE — Anesthesia Preprocedure Evaluation (Addendum)
Anesthesia Evaluation  Patient identified by MRN, date of birth, ID band Patient awake    Reviewed: Allergy & Precautions, NPO status , Patient's Chart, lab work & pertinent test results  Airway Mallampati: II  TM Distance: >3 FB Neck ROM: Full    Dental  (+) Teeth Intact, Dental Advisory Given   Pulmonary Current Smoker,    breath sounds clear to auscultation       Cardiovascular  Rhythm:Regular Rate:Normal     Neuro/Psych    GI/Hepatic   Endo/Other    Renal/GU      Musculoskeletal   Abdominal   Peds  Hematology   Anesthesia Other Findings   Reproductive/Obstetrics                            Anesthesia Physical Anesthesia Plan  ASA: III  Anesthesia Plan: General   Post-op Pain Management: GA combined w/ Regional for post-op pain   Induction:   PONV Risk Score and Plan: Ondansetron and Dexamethasone  Airway Management Planned: LMA  Additional Equipment:   Intra-op Plan:   Post-operative Plan:   Informed Consent: I have reviewed the patients History and Physical, chart, labs and discussed the procedure including the risks, benefits and alternatives for the proposed anesthesia with the patient or authorized representative who has indicated his/her understanding and acceptance.   Dental advisory given  Plan Discussed with: CRNA and Anesthesiologist  Anesthesia Plan Comments:        Anesthesia Quick Evaluation

## 2017-06-15 NOTE — ED Provider Notes (Signed)
MSE was initiated and I personally evaluated the patient and placed orders (if any) at  9:25 AM on June 15, 2017.  Patient fell while trying to get to the chair after his x-rays in the radiology suite. I went to evaluate patient, he did not hit his head or neck. C-spine appears cleared. Ground level fall.  The patient appears stable so that the remainder of the MSE may be completed by another provider.    Sherwood Gambler, MD 06/15/17 [phone]

## 2017-06-15 NOTE — ED Notes (Signed)
Patient transported to MRI 

## 2017-06-15 NOTE — Anesthesia Procedure Notes (Signed)
Anesthesia Regional Block: Femoral nerve block   Pre-Anesthetic Checklist: ,, timeout performed, Correct Patient, Correct Site, Correct Laterality, Correct Procedure, Correct Position, site marked, Risks and benefits discussed,  Surgical consent,  Pre-op evaluation,  At surgeon's request and post-op pain management  Laterality: Right  Prep: chloraprep       Needles:  Injection technique: Single-shot  Needle Type: Echogenic Stimulator Needle      Needle Gauge: 22     Additional Needles:   Procedures: ultrasound guided,,,,,,,,  Narrative:  Start time: 06/15/2017 6:50 PM End time: 06/15/2017 6:55 PM Injection made incrementally with aspirations every 5 mL.  Performed by: Personally   Additional Notes: A functioning IV was confirmed and monitors were applied.  Sterile prep and drape, hand hygiene and sterile gloves were used.  Negative aspiration prior to incremental administration of local anesthetic using the 22 ga needle. The patient tolerated the procedure well.   30 cc 1:1 0.%% Bupivacaine with 1:200 epi and 0.5% Naropin injected easily

## 2017-06-15 NOTE — Transfer of Care (Signed)
Immediate Anesthesia Transfer of Care Note  Patient: Jose [name]  Procedure(s) Performed: Procedure(s): REPAIR QUADRICEP TENDON (Bilateral)  Patient Location: PACU  Anesthesia Type:General  Level of Consciousness: awake  Airway & Oxygen Therapy: Patient Spontanous Breathing  Post-op Assessment: Report given to RN and Post -op Vital signs reviewed and stable  Post vital signs: Reviewed and stable  Last Vitals:  Vitals:   06/15/17 1905 06/15/17 1906  BP:  (!) 141/104  Pulse: 77 83  Resp: 11 13  Temp:      Last Pain:  Vitals:   06/15/17 1906  TempSrc:   PainSc: 0-No pain      Patients Stated Pain Goal: 2 (79/02/40 9735)  Complications: No apparent anesthesia complications

## 2017-06-15 NOTE — ED Notes (Signed)
Alarm pulled in radiology.  Pt found lying on R side in floor.  Tech reports Pt ambulated from wheelchair onto exam table and fell when transferring back.  Pt denying neck and back pain.  Refusing c-collar.  EDP called room for evaluation.

## 2017-06-15 NOTE — Progress Notes (Signed)
325 mg aspirin given PO at 2325.

## 2017-06-16 MED ORDER — METOCLOPRAMIDE HCL 5 MG PO TABS: 5.0000 mg | ORAL_TABLET | Freq: Three times a day (TID) | ORAL | Status: DC | PRN

## 2017-06-16 MED ORDER — ONDANSETRON HCL 4 MG/2ML IJ SOLN: 4.0000 mg | Freq: Four times a day (QID) | INTRAMUSCULAR | Status: DC | PRN

## 2017-06-16 MED ORDER — ZOLPIDEM TARTRATE 5 MG PO TABS
5.0000 mg | ORAL_TABLET | Freq: Once | ORAL | Status: AC
  Administered 2017-06-16: 5 mg via ORAL
  Filled 2017-06-16: qty 1

## 2017-06-16 MED ORDER — ACETAMINOPHEN 325 MG PO TABS
650.0000 mg | ORAL_TABLET | Freq: Four times a day (QID) | ORAL | Status: DC | PRN
  Filled 2017-06-16: qty 2

## 2017-06-16 MED ORDER — KETOROLAC TROMETHAMINE 30 MG/ML IJ SOLN
30.0000 mg | Freq: Four times a day (QID) | INTRAMUSCULAR | Status: AC | PRN
  Administered 2017-06-16 – 2017-06-17 (×3): 30 mg via INTRAVENOUS
  Filled 2017-06-16 (×3): qty 1

## 2017-06-16 MED ORDER — ONDANSETRON HCL 4 MG PO TABS: 4.0000 mg | ORAL_TABLET | Freq: Four times a day (QID) | ORAL | Status: DC | PRN

## 2017-06-16 MED ORDER — METHOCARBAMOL 1000 MG/10ML IJ SOLN: 500.0000 mg | Freq: Four times a day (QID) | INTRAMUSCULAR | Status: DC | PRN

## 2017-06-16 MED ORDER — OXYCODONE HCL 5 MG PO TABS
5.0000 mg | ORAL_TABLET | ORAL | Status: DC | PRN
  Administered 2017-06-16 – 2017-06-17 (×6): 10 mg via ORAL
  Filled 2017-06-16 (×8): qty 2

## 2017-06-16 MED ORDER — METOPROLOL SUCCINATE ER 25 MG PO TB24
25.0000 mg | ORAL_TABLET | Freq: Every day | ORAL | Status: DC
  Administered 2017-06-16 – 2017-06-19 (×4): 25 mg via ORAL
  Filled 2017-06-16 (×4): qty 1

## 2017-06-16 MED ORDER — HYDROCODONE-ACETAMINOPHEN 5-325 MG PO TABS
1.0000 | ORAL_TABLET | ORAL | Status: DC | PRN
  Administered 2017-06-16 – 2017-06-19 (×8): 2 via ORAL
  Filled 2017-06-16 (×9): qty 2

## 2017-06-16 MED ORDER — ALLOPURINOL 300 MG PO TABS
300.0000 mg | ORAL_TABLET | Freq: Every day | ORAL | Status: DC
  Administered 2017-06-16 – 2017-06-19 (×4): 300 mg via ORAL
  Filled 2017-06-16 (×4): qty 1

## 2017-06-16 MED ORDER — CEFAZOLIN SODIUM-DEXTROSE 1-4 GM/50ML-% IV SOLN
1.0000 g | Freq: Four times a day (QID) | INTRAVENOUS | Status: AC
  Administered 2017-06-16 (×2): 1 g via INTRAVENOUS
  Filled 2017-06-16 (×2): qty 50

## 2017-06-16 MED ORDER — METOCLOPRAMIDE HCL 5 MG/ML IJ SOLN: 5.0000 mg | Freq: Three times a day (TID) | INTRAMUSCULAR | Status: DC | PRN

## 2017-06-16 MED ORDER — DIPHENHYDRAMINE HCL 25 MG PO CAPS
25.0000 mg | ORAL_CAPSULE | Freq: Once | ORAL | Status: AC
  Administered 2017-06-16: 25 mg via ORAL
  Filled 2017-06-16: qty 1

## 2017-06-16 MED ORDER — ADULT MULTIVITAMIN W/MINERALS CH
1.0000 | ORAL_TABLET | Freq: Every day | ORAL | Status: DC
  Administered 2017-06-16 – 2017-06-19 (×4): 1 via ORAL
  Filled 2017-06-16 (×4): qty 1

## 2017-06-16 MED ORDER — ACETAMINOPHEN 650 MG RE SUPP: 650.0000 mg | Freq: Four times a day (QID) | RECTAL | Status: DC | PRN

## 2017-06-16 MED ORDER — PANTOPRAZOLE SODIUM 40 MG PO TBEC
40.0000 mg | DELAYED_RELEASE_TABLET | Freq: Every day | ORAL | Status: DC
  Administered 2017-06-16 – 2017-06-19 (×4): 40 mg via ORAL
  Filled 2017-06-16 (×4): qty 1

## 2017-06-16 MED ORDER — METHOCARBAMOL 500 MG PO TABS
500.0000 mg | ORAL_TABLET | Freq: Four times a day (QID) | ORAL | Status: DC | PRN
  Administered 2017-06-16 – 2017-06-19 (×6): 500 mg via ORAL
  Filled 2017-06-16 (×7): qty 1

## 2017-06-16 MED ORDER — COLCHICINE 0.6 MG PO TABS: 0.6000 mg | ORAL_TABLET | Freq: Every day | ORAL | Status: DC | PRN

## 2017-06-16 NOTE — Anesthesia Postprocedure Evaluation (Signed)
Anesthesia Post Note  Patient: Jose [name]  Procedure(s) Performed: Procedure(s) (LRB): REPAIR QUADRICEP TENDON (Bilateral)     Patient location during evaluation: PACU Anesthesia Type: General Level of consciousness: awake, awake and alert and oriented Pain management: pain level controlled Vital Signs Assessment: post-procedure vital signs reviewed and stable Respiratory status: spontaneous breathing, nonlabored ventilation and respiratory function stable Cardiovascular status: blood pressure returned to baseline Anesthetic complications: no    Last Vitals:  Vitals:   06/15/17 2330 06/15/17 2345  BP: (!) 153/99 (!) 157/99  Pulse: 64 73  Resp: 11 19  Temp:  37.4 C    Last Pain:  Vitals:   06/15/17 2315  TempSrc:   PainSc: Asleep                 Alaney Witter COKER

## 2017-06-16 NOTE — Evaluation (Addendum)
Physical Therapy Evaluation Patient Details Name: Jose [name] MRN: [id number] DOB: 1951/12/12 Today's Date: 06/16/2017   History of Present Illness  Pt is a 66 yo male admitted through ED on 06/15/17 following a fall at home resulting in bilateral quadriceps tendon ruptures. Pt had a partial tear of left quad tendon which was ultimately a weak point as his knee have out and he fell to the floor landing on his knees and rupturing both quadiceps tendons. Pt underwent patella tendon repair on 06/15/17. Pt did have a fall while with imaging. PMH significant for OA, GERD and subdural hematoma.   Clinical Impression  Pt presents with the above diagnosis and below deficits for therapy evaluation. 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 Pt has a history of subdural hematoma which he is does not recall the cause or events from. Pt requires Mod to Max A for all mobility this session due to pain and instability which limits his ability to tolerate standing. Pt does not want to perform any mobility away from the bed at this time and will require Mod-Max A for mobility at this time. Due to current functional limitations, lack of 24hr caregiver assistance and 17 steps to get up to his apartment, pt will benefit from CIR at discharge. Pt continues to benefit from continued acute rehab services in order to address the below deficits prior to discharge.     Follow Up Recommendations CIR    Equipment Recommendations  Rolling walker with 5" wheels;3in1 (PT);Wheelchair (measurements PT);Wheelchair cushion (measurements PT)    Recommendations for Other Services Rehab consult     Precautions / Restrictions Precautions Precautions: Fall;Knee Precaution Booklet Issued: No Required Braces or Orthoses: Knee Immobilizer - Right;Knee Immobilizer - Left Knee Immobilizer - Right: On at all times Knee Immobilizer - Left: On at all  times Restrictions Weight Bearing Restrictions: Yes RLE Weight Bearing: Weight bearing as tolerated LLE Weight Bearing: Weight bearing as tolerated Other Position/Activity Restrictions: in knee immobilizers      Mobility  Bed Mobility Overal bed mobility: Needs Assistance Bed Mobility: Supine to Sit;Sit to Supine     Supine to sit: Min assist;HOB elevated Sit to supine: Min assist   General bed mobility comments: MIn A to bring LE's into and out of bed with use of railings  Transfers Overall transfer level: Needs assistance Equipment used: Rolling walker (2 wheeled) Transfers: Sit to/from Stand Sit to Stand: Mod assist;+2 physical assistance;From elevated surface         General transfer comment: +2 A from elevated bed due to immobilization of bilateral knees. Pt becomes anxious with weight bearing due to multiple falls.   Ambulation/Gait         Gait velocity: Did not attempt this session      Stairs            Wheelchair Mobility    Modified Rankin (Stroke Patients Only)       Balance Overall balance assessment: Needs assistance Sitting-balance support: No upper extremity supported;Feet supported Sitting balance-Leahy Scale: Fair     Standing balance support: Bilateral upper extremity supported Standing balance-Leahy Scale: Poor                               Pertinent Vitals/Pain Pain Assessment: 0-10 Pain Score: 8  Pain Location:  L>R knees Pain Descriptors / Indicators: Aching Pain Intervention(s): Monitored during  session;Premedicated before session;Repositioned    Home Living Family/patient expects to be discharged to:: Private residence Living Arrangements: Alone Available Help at Discharge: Friend(s);Available PRN/intermittently Type of Home: Apartment Home Access: Stairs to enter Entrance Stairs-Rails: Psychiatric nurse of Steps: 17 Home Layout: One level        Prior Function Level of  Independence: Independent         Comments: completely independent, active, driving, still working full time.      Hand Dominance   Dominant Hand: Right    Extremity/Trunk Assessment   Upper Extremity Assessment Upper Extremity Assessment: Defer to OT evaluation    Lower Extremity Assessment Lower Extremity Assessment: RLE deficits/detail;LLE deficits/detail RLE Deficits / Details: post op pain and weakness. At least 3/5 grossly  RLE: Unable to fully assess due to immobilization LLE Deficits / Details: post op pain and weakness. At least 3/5 grossly  LLE: Unable to fully assess due to immobilization    Cervical / Trunk Assessment Cervical / Trunk Assessment: Normal  Communication   Communication: No difficulties  Cognition Arousal/Alertness: Awake/alert Behavior During Therapy: WFL for tasks assessed/performed Overall Cognitive Status: Within Functional Limits for tasks assessed                                        General Comments      Exercises Total Joint Exercises Ankle Circles/Pumps: AROM;Both;20 reps;Supine Hip ABduction/ADduction: AROM;Both;10 reps;Supine   Assessment/Plan    PT Assessment Patient needs continued PT services  PT Problem List Decreased strength;Decreased range of motion;Decreased activity tolerance;Decreased balance;Decreased mobility;Decreased knowledge of use of DME;Pain       PT Treatment Interventions DME instruction;Gait training;Functional mobility training;Therapeutic activities;Therapeutic exercise;Balance training    PT Goals (Current goals can be found in the Care Plan section)  Acute Rehab PT Goals Patient Stated Goal: to get back to moving PT Goal Formulation: With patient Time For Goal Achievement: 06/30/17 Potential to Achieve Goals: Good    Frequency Min 5X/week   Barriers to discharge        Co-evaluation               AM-PAC PT "6 Clicks" Daily Activity  Outcome Measure Difficulty  turning over in bed (including adjusting bedclothes, sheets and blankets)?: Total Difficulty moving from lying on back to sitting on the side of the bed? : Total Difficulty sitting down on and standing up from a chair with arms (e.g., wheelchair, bedside commode, etc,.)?: Total Help needed moving to and from a bed to chair (including a wheelchair)?: Total Help needed walking in hospital room?: Total Help needed climbing 3-5 steps with a railing? : Total 6 Click Score: 6    End of Session Equipment Utilized During Treatment: Gait belt;Right knee immobilizer;Left knee immobilizer Activity Tolerance: Patient limited by pain Patient left: in bed;with call bell/phone within reach;with family/visitor present Nurse Communication: Mobility status PT Visit Diagnosis: Unsteadiness on feet (R26.81);Difficulty in walking, not elsewhere classified (R26.2);Pain;Muscle weakness (generalized) (M62.81) Pain - Right/Left: Left Pain - part of body: Knee    Time: 4854-6270 PT Time Calculation (min) (ACUTE ONLY): 29 min   Charges:   PT Evaluation $PT Eval Moderate Complexity: 1 Procedure PT Treatments $Therapeutic Activity: 8-22 mins   PT G Codes:        Scheryl Marten PT, DPT  [phone]   Jacqulyn Liner Sloan Leiter 06/16/2017, 2:43 PM

## 2017-06-17 ENCOUNTER — Inpatient Hospital Stay (HOSPITAL_COMMUNITY): Payer: Medicare Other

## 2017-06-17 DIAGNOSIS — G8918 Other acute postprocedural pain: Secondary | ICD-10-CM

## 2017-06-17 DIAGNOSIS — S76111D Strain of right quadriceps muscle, fascia and tendon, subsequent encounter: Secondary | ICD-10-CM

## 2017-06-17 DIAGNOSIS — W19XXXD Unspecified fall, subsequent encounter: Secondary | ICD-10-CM

## 2017-06-17 DIAGNOSIS — R296 Repeated falls: Secondary | ICD-10-CM

## 2017-06-17 DIAGNOSIS — M79609 Pain in unspecified limb: Secondary | ICD-10-CM

## 2017-06-17 DIAGNOSIS — Z8679 Personal history of other diseases of the circulatory system: Secondary | ICD-10-CM

## 2017-06-17 MED ORDER — DOCUSATE SODIUM 100 MG PO CAPS
100.0000 mg | ORAL_CAPSULE | Freq: Two times a day (BID) | ORAL | Status: DC
  Administered 2017-06-17 – 2017-06-19 (×5): 100 mg via ORAL
  Filled 2017-06-17 (×5): qty 1

## 2017-06-17 MED ORDER — ZOLPIDEM TARTRATE 5 MG PO TABS
5.0000 mg | ORAL_TABLET | Freq: Every evening | ORAL | Status: DC | PRN
  Administered 2017-06-17 – 2017-06-18 (×2): 5 mg via ORAL
  Filled 2017-06-17 (×2): qty 1

## 2017-06-17 NOTE — Progress Notes (Signed)
Orthopedic Tech Progress Note Patient Details:  Jose [name] 12/20/1950 [id number]  Patient ID: Elayne Snare Cherry, male   DOB: 1951-06-07, 66 y.o.   MRN: [id number]   [place] Pink 06/17/2017, 10:52 AMCalled [employer] for bilateral Bledsoe brace.

## 2017-06-17 NOTE — Progress Notes (Addendum)
Subjective: Patient stable and pain controlled.  He can do bilateral straight leg raises at this time   Objective: Vital signs in last 24 hours: Temp:  [98.3 F (36.8 C)-100.6 F (38.1 C)] 98.8 F (37.1 C) (07/01 0443) Pulse Rate:  [63-71] 63 (07/01 0443) Resp:  [17-18] 18 (07/01 0443) BP: (122-131)/(69-84) 123/76 (07/01 0443) SpO2:  [95 %-98 %] 95 % (07/01 0443)  Intake/Output from previous day: 06/30 0701 - 07/01 0700 In: 720 [P.O.:720] Out: 1400 [Urine:1400] Intake/Output this shift: Total I/O In: -  Out: 200 [Urine:200]  Exam:  Intact pulses distally Dorsiflexion/Plantar flexion intact No groin pain with right leg range of motion Labs:  Recent Labs  06/15/17 1645  HGB 15.3    Recent Labs  06/15/17 1645  WBC 9.3  RBC 4.83  HCT 45.2  PLT 191    Recent Labs  06/15/17 1645  NA 139  K 3.5  CL 104  CO2 25  BUN 11  CREATININE 1.01  GLUCOSE 99  CALCIUM 9.2   No results for input(s): LABPT, INR in the last 72 hours.  Assessment/Plan: Plan at this time is to remove the Ace wrap which is done.  It's okay for him to be in the bed without the need mobilize there is no more knee flexion and when he has right now.  He has an atraumatic subdural, history from 2 years ago.  For that reason wanted to avoid Xarelto and Coumadin type blood thinners.  He is on aspirin for deep prophylaxis at this time.  Plan today to obtain bilateral lower 70 ultrasound to rule out DVT.  If negative we will continue aspirin for DVT prophylaxis if positive we will need to reconsider risk and benefits of pharmacologic DVT prophylaxis versus filter placement.  I also want to obtain bilateral Bledsoe braces for the knees. In extension to start with.  Acute inpatient rehabilitation consult pending   Eliab Closson 06/17/2017, 10:38 AM

## 2017-06-17 NOTE — Progress Notes (Signed)
VASCULAR LAB PRELIMINARY  PRELIMINARY  PRELIMINARY  PRELIMINARY  Bilateral lower extremity venous duplex completed.    Preliminary report:  There is no DVT or SVT noted in the bilateral lower extremities.   Cathi Hazan, RVT 06/17/2017, 12:55 PM

## 2017-06-17 NOTE — Progress Notes (Signed)
OT Cancellation Note  Patient Details Name: Jose [name] MRN: [id number] DOB: 11/09/1951   Cancelled Treatment:    Reason Eval/Treat Not Completed: Medical issues which prohibited therapy.  Pt awaiting bil. LE ultrsound to rule out DVT.  Will proceed once results negative or if cleared by MD.  Lucille Passy, OTR/L [phone]   Lucille Passy M 06/17/2017, 12:05 PM

## 2017-06-17 NOTE — Progress Notes (Signed)
Rehab Admissions Coordinator Note:  Patient was screened by Retta Diones for appropriateness for an Inpatient Acute Rehab Consult.  At this time, we are recommending Inpatient Rehab consult.  Jodell Cipro M 06/17/2017, 9:24 AM  I can be reached at [phone].

## 2017-06-18 ENCOUNTER — Encounter (HOSPITAL_COMMUNITY): Payer: Self-pay | Admitting: Physical Medicine and Rehabilitation

## 2017-06-18 DIAGNOSIS — R296 Repeated falls: Secondary | ICD-10-CM

## 2017-06-18 DIAGNOSIS — G8918 Other acute postprocedural pain: Secondary | ICD-10-CM

## 2017-06-18 DIAGNOSIS — Z8679 Personal history of other diseases of the circulatory system: Secondary | ICD-10-CM

## 2017-06-18 DIAGNOSIS — W19XXXA Unspecified fall, initial encounter: Secondary | ICD-10-CM

## 2017-06-18 NOTE — Progress Notes (Deleted)
Spoke with lab in reference to lab draw this am.  Lab tech stated that an attempt was made but unsuccessful. Lab will re attempt this am.

## 2017-06-18 NOTE — Consult Note (Addendum)
Physical Medicine and Rehabilitation Consult  Reason for Consult:  Bilateral quad tendon rupture Referring Physician: Dr. Marlou Sa   HPI: Jose [name] is a 66 y.o. male with history of SDH, multiple falls, partial quad rupture on left but functional. He was admitted on 06/15/17 with recurrent fall with pain and inability to extend either knee due to bilateral quad tendon rupture. He underwent quad tendon repair by Dr. Marlou Sa and is to wear B-KI at all times. Therapy evaluations done and patient limited by B-KI and difficulty to WB due to anxiety/fear CIR recommended for follow up therapy.  Patient lives alone in an apartment with 17 stairs at entry.  Reports that he is making plans to stay with a friend after discharge.  He was  independent and working PTA. Needed min assist to transfer and walked 25 ' with mobility tech this am.    Review of Systems  HENT: Negative for hearing loss and tinnitus.   Eyes: Negative for blurred vision and double vision.  Respiratory: Negative for cough and shortness of breath.   Cardiovascular: Negative for chest pain and palpitations.  Gastrointestinal: Positive for constipation (no BM since 6/29). Negative for heartburn and nausea.  Genitourinary: Negative for dysuria and urgency.  Musculoskeletal: Positive for joint pain. Negative for myalgias.  Skin: Negative for rash.  Neurological: Negative for dizziness and headaches.  Psychiatric/Behavioral: Negative for hallucinations. The patient has insomnia. The patient is not nervous/anxious.   All other systems reviewed and are negative.     Past Medical History:  Diagnosis Date  . Arthritis    R shoulder, great toes, hands- has gout & has injections in knees with Dr. Estanislado Pandy   . Cancer ([place])    skin ca-   . GERD (gastroesophageal reflux disease)   . History of hiatal hernia   . SDH (subdural hematoma) ([facility])     Past Surgical History:  Procedure Laterality Date  . BRAIN SURGERY  2016   evacuation of SDH  . CARPAL TUNNEL RELEASE Bilateral   . GREEN LIGHT LASER TURP (TRANSURETHRAL RESECTION OF PROSTATE  04/27/2017  . SHOULDER SURGERY Right     Family History  Problem Relation Age of Onset  . Parkinson's disease Mother   . Heart disease Father   . Parkinson's disease Brother   . Diabetes Brother      Social History:  Lives alone. 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 He reports that he has been smoking Cigars.  He has never used smokeless tobacco. He reports that he drinks about 1.8 oz of alcohol per week . He reports that he does not use drugs.    Allergies: No Known Allergies    Medications Prior to Admission  Medication Sig Dispense Refill  . acetaminophen (TYLENOL) 500 MG tablet Take 500-1,000 mg by mouth every 6 (six) hours as needed for mild pain.    Marland Kitchen allopurinol (ZYLOPRIM) 300 MG tablet TAKE 1 TABLET BY MOUTH EVERY DAY 90 tablet 1  . colchicine 0.6 MG tablet Take 1 tablet (0.6 mg total) by mouth daily as needed (FOR GOUT FLAREUPS). 30 tablet 3  . esomeprazole (NEXIUM) 40 MG capsule Take 1 capsule (40 mg total) by mouth daily. 30 capsule 0  . metoprolol succinate (TOPROL-XL) 25 MG 24 hr tablet Take 1 tablet (25 mg total) by mouth daily. Take with or immediately following a meal. 90 tablet 3  . Multiple Vitamin (MULTIVITAMIN WITH MINERALS) TABS tablet Take 1 tablet by mouth daily.    Marland Kitchen  cephALEXin (KEFLEX) 500 MG capsule Take 1 capsule (500 mg total) by mouth 4 (four) times daily. (Patient not taking: Reported on 06/15/2017) 20 capsule 0    Home: Home Living Family/patient expects to be discharged to:: Private residence Living Arrangements: Alone Available Help at Discharge: Friend(s), Available PRN/intermittently Type of Home: Apartment Home Access: Stairs to enter [employer] of Steps: 17 Entrance Stairs-Rails: Right, Left Home Layout: One level Bathroom Shower/Tub: Research officer, trade [employer] Accessibility: Yes  Functional History: Prior  Function Level of Independence: Independent Comments: completely independent, active, driving, still working full time.  Functional Status:  Mobility: Bed Mobility Overal bed mobility: Needs Assistance Bed Mobility: Supine to Sit, Sit to Supine Supine to sit: Min assist, HOB elevated Sit to supine: Min assist General bed mobility comments: MIn A to bring LE's into and out of bed with use of railings Transfers Overall transfer level: Needs assistance Equipment used: Rolling walker (2 wheeled) Transfers: Sit to/from Stand Sit to Stand: Mod assist, +2 physical assistance, From elevated surface General transfer comment: +2 A from elevated bed due to immobilization of bilateral knees. Pt becomes anxious with weight bearing due to multiple falls.  Ambulation/Gait Gait velocity: Did not attempt this session    ADL:    Cognition: Cognition Overall Cognitive Status: Within Functional Limits for tasks assessed Orientation Level: Oriented X4 Cognition Arousal/Alertness: Awake/alert Behavior During Therapy: WFL for tasks assessed/performed Overall Cognitive Status: Within Functional Limits for tasks assessed  Blood pressure 134/71, pulse 66, temperature 99.1 F (37.3 C), resp. rate 19, height 6' (1.829 m), weight 89.4 kg (197 lb), SpO2 96 %. Physical Exam  Nursing note and vitals reviewed. Constitutional: He is oriented to person, place, and time. He appears well-developed and well-nourished.  HENT:  Head: Normocephalic and atraumatic.  Mouth/Throat: Oropharynx is clear and moist.  Eyes: Conjunctivae and EOM are normal. Pupils are equal, round, and reactive to light.  Neck: Normal range of motion. Neck supple.  Cardiovascular: Normal rate and regular rhythm.   Respiratory: Effort normal and breath sounds normal. No stridor. No respiratory distress. He has no wheezes.  GI: Soft. Bowel sounds are normal. He exhibits no distension. There is no tenderness.  Musculoskeletal: He exhibits  edema and tenderness.  Neurological: He is alert and oriented to person, place, and time.  Motor: B/l UE 5/5 proximal to distal B/l LE: HF 4/5, ADF/PF 5/5 Sensation subjectively diminished to light touch b/l toes  Skin: Skin is warm and dry.  Psychiatric: He has a normal mood and affect. His behavior is normal.    No results found for this or any previous visit (from the past 24 hour(s)). No results found.  Assessment/Plan: Diagnosis: B/l quad tendon rupture s/p repair Labs independently reviewed.  Records reviewed and summated above.  1. Does the need for close, 24 hr/day medical supervision in concert with the patient's rehab needs make it unreasonable for this patient to be served in a less intensive setting? No  2. Co-Morbidities requiring supervision/potential complications: recurrent falls, SDH, post-op pain (Biofeedback training with therapies to help reduce reliance on opiate pain medications, particularly IV toradol, monitor pain control during therapies, and sedation at rest and titrate to maximum efficacy to ensure participation and gains in therapies) 3. Due to safety, skin/wound care, disease management, pain management and patient education, does the patient require 24 hr/day rehab nursing? Potentially 4. Does the patient require coordinated care of a physician, rehab nurse, PT (1-2 hrs/day, 5 days/week) and OT (1-2 hrs/day, 5 days/week) to  address physical and functional deficits in the context of the above medical diagnosis(es)? Yes Addressing deficits in the following areas: balance, endurance, locomotion, strength, transferring, bathing, dressing, toileting and psychosocial support 5. Can the patient actively participate in an intensive therapy program of at least 3 hrs of therapy per day at least 5 days per week? Yes 6. The potential for patient to make measurable gains while on inpatient rehab is excellent 7. Anticipated functional outcomes upon discharge from inpatient  rehab are modified independent and supervision  with PT, modified independent and supervision with OT, n/a with SLP. 8. Estimated rehab length of stay to reach the above functional goals is: 6-9 days. 9. Anticipated D/C setting: Home 10. Anticipated post D/C treatments: Outpatient therapy 11. Overall Rehab/Functional Prognosis: excellent  RECOMMENDATIONS: This patient's condition is appropriate for continued rehabilitative care in the following setting: Pt appears to be making good functional progress. Further, pt does not have a medical need that would warrant IRF.  Recommend home with outpatient therapies. Patient has agreed to participate in recommended program. Potentially Note that insurance prior authorization may be required for reimbursement for recommended care.  Comment: Rehab Admissions Coordinator to follow up.  Delice Lesch, MD, Mellody Drown 06/17/17 Bary Leriche, PA-C 06/18/2017

## 2017-06-18 NOTE — Progress Notes (Signed)
Subjective: Pt stable - pain ok - u/s neg for dvt - will continue on asa  Objective: Vital signs in last 24 hours: Temp:  [98.6 F (37 C)-99.1 F (37.3 C)] 99.1 F (37.3 C) (07/02 0601) Pulse Rate:  [60-66] 66 (07/02 0601) Resp:  [19] 19 (07/02 0601) BP: (124-141)/(71-83) 134/71 (07/02 0601) SpO2:  [96 %-97 %] 96 % (07/02 0601)  Intake/Output from previous day: 07/01 0701 - 07/02 0700 In: -  Out: 900 [Urine:900] Intake/Output this shift: No intake/output data recorded.  Exam:  Intact pulses distally Dorsiflexion/Plantar flexion intact  Labs:  Recent Labs  06/15/17 1645  HGB 15.3    Recent Labs  06/15/17 1645  WBC 9.3  RBC 4.83  HCT 45.2  PLT 191    Recent Labs  06/15/17 1645  NA 139  K 3.5  CL 104  CO2 25  BUN 11  CREATININE 1.01  GLUCOSE 99  CALCIUM 9.2   No results for input(s): LABPT, INR in the last 72 hours.  Assessment/Plan: Plan for mobilization wbat in bil bledsoe braces today - rehab consult - ready for transfer once bed available - sw consult   G Tommaso Cavitt 06/18/2017, 7:57 AM

## 2017-06-18 NOTE — Op Note (Signed)
NAME:  Jose [name], Jose [name] NO.:  [id number]  MEDICAL RECORD NO.:  [id number]  LOCATION:                                 FACILITY:  PHYSICIAN:  Anderson Malta, M.D.         DATE OF BIRTH:  DATE OF PROCEDURE:  07/15/2017 DATE OF DISCHARGE:                              OPERATIVE REPORT   PREOPERATIVE DIAGNOSIS:  Bilateral quadriceps tendon rupture.  POSTOPERATIVE DIAGNOSIS:  Bilateral quadriceps tendon rupture.  PROCEDURE:  Bilateral quadriceps tendon repair.  SURGEON:  Anderson Malta, M.D.  ASSISTANT:  Dyke Brackett, RNFA.  INDICATIONS:  Jose [name] is a patient, 66 years old, who fell down the steps today.  Reports bilateral quadriceps pain and inability to extend either knee.  He was seen in the emergency room where MRI scanning of the right quadriceps tendon demonstrated rupture.  Clinically, the patient also had left quadriceps tendon rupture.  He had partial quadriceps rupture about a month ago, which he elected to treat nonoperatively.  After the fall, the patient had both quadriceps tendons ruptured.  He presents now for operative management.  PROCEDURE IN DETAIL:  The patient was brought to the operating room where general anesthetic was induced where LMA anesthetic was induced. Preoperative antibiotics administered.  Time-out was called.  Bilateral legs were prescrubbed with alcohol and Betadine and allowed to air dry, prepped with DuraPrep solution and draped in a sterile manner.  Right leg was operated on first.  [name] was used to cover the operative field. Leg was elevated and exsanguinated with the Esmarch wrap.  Tourniquet was inflated to 300 mmHg.  Total tourniquet time, 31 minutes on the right-hand side.  Anterior approach to the knee was made.  Skin and subcutaneous tissue were sharply divided.  Gap in the quadriceps tendon attachment to the patellar was encountered.  The FiberTape suture was placed in grasping Krackow fashion x2 through the tendon  proximal to distal.  Thorough irrigation performed of the joint.  The bed of the recipient patella site was prepared with a rongeur.  Three drill holes were drilled.  The sutures were then passed, 2 through the central drill hole from proximal to distal, 1 through each of the peripheral holes in the patellar tendon.  The retinaculum was closed using #1 Vicryl suture. The quadriceps tendon was then brought into the prepared bony bed of the proximal patellar itself.  The FiberTapes were then tied.  This gave a very secure repair.  Soft tissue was then reinforced over the superior anterior aspect of this repair.  At this time, the tourniquet was released.  Bleeding points encountered and controlled using electrocautery.  Thorough irrigation again performed, and the right knee was closed using 0 Vicryl suture, 2-0 Vicryl suture, and a running 3-0 Monocryl, followed by Aquacel.  On the left-hand side, the leg was elevated and exsanguinated with the Esmarch wrap.  Tourniquet was inflated.  Total tourniquet time on the left, 39 minutes.  This is the side that had a more chronic partial rupture.  Anterior approach of the knee was made.  Skin and subcutaneous tissue were sharply divided. Patellar tendon was completely avulsed.  Two FiberTapes  were placed in grasping Krackow fashion proximal to distal.  The proximal patella was prepared with a rongeur.  Three drill holes were placed proximal to distal.  FiberTapes were passed.  Retinaculum was repaired using #1 Vicryl suture on both the medial and lateral sides.  This was done with the knee in extension.  The sutures were then tied with the knee in extension to pull the quadriceps tendon into and adjacent to the bleeding proximal patellar bone.  At this time, the proximal aspect was oversewn with remnant quadriceps tendon on the superior and anterior aspects of the patella.  This gave a very secure repair.  Tourniquet released at this time.   Thorough irrigation performed.  Again, the incision was closed using a combination of 0 Vicryl suture, 2-0 Vicryl suture, and 3-0 Monocryl, followed by Aquacel bulky wrap and knee immobilizer.  The patient tolerated the procedure well without immediate complications.  Transferred to the recovery room in stable condition.     Anderson Malta, M.D.   ______________________________ [name]. [name], M.D.    GSD/MEDQ  D:  06/15/2017  T:  06/15/2017  Job:  [id number]

## 2017-06-18 NOTE — Progress Notes (Signed)
Physical Therapy Treatment Patient Details Name: Jose [name] MRN: [id number] DOB: 1951-09-01 Today's Date: 06/18/2017    History of Present Illness Pt is a 66 yo male admitted through ED on 06/15/17 following a fall at home resulting in bilateral quadriceps tendon ruptures. Pt had a partial tear of left quad tendon which was ultimately a weak point as his knee have out and he fell to the floor landing on his knees and rupturing both quadiceps tendons. Pt underwent patella tendon repair on 06/15/17. Pt did have a fall while with imaging. PMH significant for OA, GERD and subdural hematoma.     PT Comments    Patient progressing to hallway ambulation, however, still concerned about fall risk, requiring two person assist for sit to stand for safety and currently unable to consider negotiating stairs appropriate for home entry.  Feel he will need post acute inpatient rehab.  If continues to be inappropriate for CIR would need SNF level rehab.  PT to follow acutely.    Follow Up Recommendations  SNF     Equipment Recommendations  Rolling walker with 5" wheels    Recommendations for Other Services       Precautions / Restrictions Precautions Precautions: Fall;Knee Required Braces or Orthoses: Knee Immobilizer - Right;Knee Immobilizer - Left (bilateral bledsoe braces) Knee Immobilizer - Right: On at all times Knee Immobilizer - Left: On at all times Restrictions Weight Bearing Restrictions: Yes RLE Weight Bearing: Weight bearing as tolerated LLE Weight Bearing: Weight bearing as tolerated Other Position/Activity Restrictions: in knee immobilizers    Mobility  Bed Mobility Overal bed mobility: Needs Assistance Bed Mobility: Sit to Supine       Sit to supine: Mod assist   General bed mobility comments: assist for bilat LE's  Transfers Overall transfer level: Needs assistance Equipment used: Rolling walker (2 wheeled) Transfers: Sit to/from Stand Sit to Stand: +2 physical  assistance;Mod assist         General transfer comment: needs assist for anterior weight shift and lifting assist; cues for technique  Ambulation/Gait Ambulation/Gait assistance: Min assist Ambulation Distance (Feet): 90 Feet Assistive device: Rolling walker (2 wheeled) Gait Pattern/deviations: Step-to pattern;Decreased stride length;Step-through pattern     General Gait Details: gait with bilateral Bledsoe braces and lowered walker to allow improved step length and able to bear weight on walker   Stairs            Wheelchair Mobility    Modified Rankin (Stroke Patients Only)       Balance Overall balance assessment: Needs assistance Sitting-balance support: No upper extremity supported;Feet supported Sitting balance-Leahy Scale: Fair     Standing balance support: Bilateral upper extremity supported Standing balance-Leahy Scale: Poor Standing balance comment: UE support needed for balance in standing as well as reliance on Bledsoe knee braces                            Cognition Arousal/Alertness: Awake/alert Behavior During Therapy: WFL for tasks assessed/performed Overall Cognitive Status: Within Functional Limits for tasks assessed                                        Exercises      General Comments General comments (skin integrity, edema, etc.): educated on don doff brace in full extension bed vs chair.       Pertinent Vitals/Pain  Pain Assessment: 0-10 Pain Score: 5  Pain Location: bil LE Pain Descriptors / Indicators: Operative site guarding Pain Intervention(s): Monitored during session;Repositioned    Home Living Family/patient expects to be discharged to:: Private residence Living Arrangements: Alone Available Help at Discharge: Friend(s);Available PRN/intermittently Type of Home: Apartment Home Access: Stairs to enter Entrance Stairs-Rails: Right;Left Home Layout: One level Home Equipment: None Additional  Comments: will have (A) from "friend" never called her girlfriend but did exchange a kiss lip to lip with "friend" . Friend present reports she will be (A)ing up d/c     Prior Function Level of Independence: Independent      Comments: completely independent, active, driving, still working full time.    PT Goals (current goals can now be found in the care plan section) Acute Rehab PT Goals Patient Stated Goal: to get back to moving Progress towards PT goals: Progressing toward goals    Frequency    Min 5X/week      PT Plan Discharge plan needs to be updated    Co-evaluation              AM-PAC PT "6 Clicks" Daily Activity  Outcome Measure  Difficulty turning over in bed (including adjusting bedclothes, sheets and blankets)?: Total Difficulty moving from lying on back to sitting on the side of the bed? : Total Difficulty sitting down on and standing up from a chair with arms (e.g., wheelchair, bedside commode, etc,.)?: Total Help needed moving to and from a bed to chair (including a wheelchair)?: A Little Help needed walking in hospital room?: A Little Help needed climbing 3-5 steps with a railing? : Total 6 Click Score: 10    End of Session Equipment Utilized During Treatment: Gait belt;Right knee immobilizer;Left knee immobilizer   Patient left: in bed;with call bell/phone within reach   PT Visit Diagnosis: Unsteadiness on feet (R26.81);Difficulty in walking, not elsewhere classified (R26.2);Pain;Muscle weakness (generalized) (M62.81) Pain - Right/Left: Left Pain - part of body: Knee     Time: 3875-6433 PT Time Calculation (min) (ACUTE ONLY): 26 min  Charges:  $Gait Training: 8-22 mins $Therapeutic Activity: 8-22 mins                    G CodesMagda Kiel, [place] [phone] 06/18/2017    Reginia Naas 06/18/2017, 5:13 PM

## 2017-06-18 NOTE — Evaluation (Signed)
Occupational Therapy Evaluation Patient Details Name: Jose [name] MRN: [id number] DOB: 02-15-1951 Today's Date: 06/18/2017    History of Present Illness Pt is a 66 yo male admitted through ED on 06/15/17 following a fall at home resulting in bilateral quadriceps tendon ruptures. Pt had a partial tear of left quad tendon which was ultimately a weak point as his knee have out and he fell to the floor landing on his knees and rupturing both quadiceps tendons. Pt underwent patella tendon repair on 06/15/17. Pt did have a fall while with imaging. PMH significant for OA, GERD and subdural hematoma.    Clinical Impression   Patient is s/p patella tendon repair surgery resulting in functional limitations due to the deficits listed below (see OT problem list). Pt denied CIR so recommend HHOT at this time. Pt currently requires (A) to power up from chair surface. Pt reports " I dont feel ready to go home by myself. "Patient will benefit from skilled OT acutely to increase independence and safety with ADLS to allow discharge HHOT due to CIR denial.     Follow Up Recommendations  Home health OT    Equipment Recommendations  3 in 1 bedside commode;Other (comment) (RW)    Recommendations for Other Services       Precautions / Restrictions Precautions Precautions: Fall;Knee Required Braces or Orthoses: Knee Immobilizer - Right;Knee Immobilizer - Left (bledsoe) Knee Immobilizer - Right: On at all times (locked into full extension) Restrictions Weight Bearing Restrictions: Yes RLE Weight Bearing: Weight bearing as tolerated LLE Weight Bearing: Weight bearing as tolerated Other Position/Activity Restrictions: in knee immobilizers      Mobility Bed Mobility               General bed mobility comments: inc hair on arrival  Transfers Overall transfer level: Needs assistance Equipment used: Rolling walker (2 wheeled) Transfers: Sit to/from Stand Sit to Stand: +2 physical assistance;Mod  assist         General transfer comment: pt requires (A) to anterior weight shift but able to scoot to edge of chair and needs (A) to anterior shift weight onto toes    Balance Overall balance assessment: Needs assistance         Standing balance support: Bilateral upper extremity supported;During functional activity Standing balance-Leahy Scale: Fair                             ADL either performed or assessed with clinical judgement   ADL Overall ADL's : Needs assistance/impaired Eating/Feeding: Independent   Grooming: Wash/dry hands;Wash/dry face;Oral care;Min guard   Upper Body Bathing: Supervision/ safety   Lower Body Bathing: Moderate assistance;Sit to/from stand       Lower Body Dressing: Moderate assistance   Toilet Transfer: Moderate assistance Toilet Transfer Details (indicate cue type and reason): pt able to complete transfer but          Functional mobility during ADLs: Min guard;Rolling walker General ADL Comments: pt requires (A) to power up from chair and toilet. Pt able to progress from toilet with less (A) than chair. pt works for [employer] and already expressed interest in Warden/ranger Baseline Vision/History: No visual deficits       Environmental education officer      Pertinent Vitals/Pain Pain Assessment: 0-10 Pain Score: 5  Pain Location: bil LE Pain Descriptors / Indicators: Aching;Operative site guarding Pain Intervention(s): Monitored during  session;Limited activity within patient's tolerance;Repositioned;Patient requesting pain meds-RN notified     Hand Dominance Right   Extremity/Trunk Assessment Upper Extremity Assessment Upper Extremity Assessment: Overall WFL for tasks assessed   Lower Extremity Assessment Lower Extremity Assessment: Defer to PT evaluation   Cervical / Trunk Assessment Cervical / Trunk Assessment: Normal   Communication Communication Communication: No difficulties   Cognition  Arousal/Alertness: Awake/alert Behavior During Therapy: WFL for tasks assessed/performed Overall Cognitive Status: Within Functional Limits for tasks assessed                                     General Comments  educated on don doff brace in full extension bed vs chair.     Exercises     Shoulder Instructions      Home Living Family/patient expects to be discharged to:: Private residence Living Arrangements: Alone Available Help at Discharge: Friend(s);Available PRN/intermittently Type of Home: Apartment Home Access: Stairs to enter Entrance Stairs-Number of Steps: 17 Entrance Stairs-Rails: Right;Left Home Layout: One level     Bathroom Shower/Tub: Teacher, early years/pre: Standard     Home Equipment: None   Additional Comments: will have (A) from "friend" never called her girlfriend but did exchange a kiss lip to lip with "friend" . Friend present reports she will be (A)ing up d/c       Prior Functioning/Environment Level of Independence: Independent        Comments: completely independent, active, driving, still working full time.         OT Problem List: Decreased strength;Decreased activity tolerance;Impaired balance (sitting and/or standing);Decreased safety awareness;Decreased knowledge of use of DME or AE;Decreased knowledge of precautions;Pain      OT Treatment/Interventions: Self-care/ADL training;Therapeutic exercise;DME and/or AE instruction;Therapeutic activities;Patient/family education;Balance training    OT Goals(Current goals can be found in the care plan section) Acute Rehab OT Goals Patient Stated Goal: to get back to moving OT Goal Formulation: With patient Time For Goal Achievement: 07/02/17 Potential to Achieve Goals: Good  OT Frequency: Min 2X/week   Barriers to D/C:            Co-evaluation              AM-PAC PT "6 Clicks" Daily Activity     Outcome Measure Help from another person eating meals?:  None Help from another person taking care of personal grooming?: None Help from another person toileting, which includes using toliet, bedpan, or urinal?: A Little Help from another person bathing (including washing, rinsing, drying)?: A Little Help from another person to put on and taking off regular upper body clothing?: A Little Help from another person to put on and taking off regular lower body clothing?: A Lot 6 Click Score: 19   End of Session Equipment Utilized During Treatment: Gait belt;Rolling walker Nurse Communication: Mobility status;Precautions  Activity Tolerance: Patient tolerated treatment well Patient left: in chair;with call bell/phone within reach  OT Visit Diagnosis: Unsteadiness on feet (R26.81)                Time: 1329-1350 OT Time Calculation (min): 21 min Charges:  OT General Charges $OT Visit: 1 Procedure OT Evaluation $OT Eval Moderate Complexity: 1 Procedure G-Codes:      Jeri Modena   OTR/L Pager: [phone] Office: [phone] .   Parke Poisson B 06/18/2017, 3:08 PM

## 2017-06-18 NOTE — Progress Notes (Signed)
Rehab admissions - Please see rehab consult done by Dr. Posey Pronto recommending [facility] or outpatient therapy follow-up.  Patient does not have the medical necessity to support an acute inpatient rehab admission.  Call me for questions.  #[phone]

## 2017-06-18 NOTE — Progress Notes (Signed)
Pt sitting up in a chair at this time watching television. Pt stable. Dressings to bilateral knees intact. Immobilizer in place. Pt denies pain at this time. Call bell within reach. Will continue to monitor.

## 2017-06-18 NOTE — Progress Notes (Deleted)
Offered and encouraged prn Miralax but pt refused stating that bowel activity normal. Pt educated. Pt continued to refuse.

## 2017-06-19 ENCOUNTER — Encounter (HOSPITAL_COMMUNITY): Payer: Self-pay

## 2017-06-19 ENCOUNTER — Inpatient Hospital Stay (HOSPITAL_COMMUNITY)
Admission: RE | Admit: 2017-06-19 | Discharge: 2017-06-27 | DRG: 949 | Disposition: A | Discharge status: Home Health | Payer: Medicare Other | Source: Intra-hospital | Attending: Physical Medicine & Rehabilitation | Admitting: Physical Medicine & Rehabilitation

## 2017-06-19 DIAGNOSIS — R2689 Other abnormalities of gait and mobility: Secondary | ICD-10-CM | POA: Diagnosis present

## 2017-06-19 DIAGNOSIS — Z820 Family history of epilepsy and other diseases of the nervous system: Secondary | ICD-10-CM

## 2017-06-19 DIAGNOSIS — K219 Gastro-esophageal reflux disease without esophagitis: Secondary | ICD-10-CM | POA: Diagnosis present

## 2017-06-19 DIAGNOSIS — K5900 Constipation, unspecified: Secondary | ICD-10-CM | POA: Diagnosis not present

## 2017-06-19 DIAGNOSIS — H9190 Unspecified hearing loss, unspecified ear: Secondary | ICD-10-CM | POA: Diagnosis present

## 2017-06-19 DIAGNOSIS — M109 Gout, unspecified: Secondary | ICD-10-CM | POA: Diagnosis present

## 2017-06-19 DIAGNOSIS — Z9181 History of falling: Secondary | ICD-10-CM

## 2017-06-19 DIAGNOSIS — Z8249 Family history of ischemic heart disease and other diseases of the circulatory system: Secondary | ICD-10-CM

## 2017-06-19 DIAGNOSIS — S76111D Strain of right quadriceps muscle, fascia and tendon, subsequent encounter: Secondary | ICD-10-CM

## 2017-06-19 DIAGNOSIS — S76112S Strain of left quadriceps muscle, fascia and tendon, sequela: Secondary | ICD-10-CM

## 2017-06-19 DIAGNOSIS — S76111S Strain of right quadriceps muscle, fascia and tendon, sequela: Secondary | ICD-10-CM

## 2017-06-19 DIAGNOSIS — F419 Anxiety disorder, unspecified: Secondary | ICD-10-CM | POA: Diagnosis present

## 2017-06-19 DIAGNOSIS — Z4889 Encounter for other specified surgical aftercare: Secondary | ICD-10-CM | POA: Diagnosis not present

## 2017-06-19 DIAGNOSIS — Z833 Family history of diabetes mellitus: Secondary | ICD-10-CM | POA: Diagnosis not present

## 2017-06-19 DIAGNOSIS — F17290 Nicotine dependence, other tobacco product, uncomplicated: Secondary | ICD-10-CM | POA: Diagnosis present

## 2017-06-19 DIAGNOSIS — E8809 Other disorders of plasma-protein metabolism, not elsewhere classified: Secondary | ICD-10-CM

## 2017-06-19 DIAGNOSIS — K5903 Drug induced constipation: Secondary | ICD-10-CM | POA: Diagnosis not present

## 2017-06-19 DIAGNOSIS — G479 Sleep disorder, unspecified: Secondary | ICD-10-CM

## 2017-06-19 DIAGNOSIS — G4700 Insomnia, unspecified: Secondary | ICD-10-CM | POA: Diagnosis present

## 2017-06-19 DIAGNOSIS — E860 Dehydration: Secondary | ICD-10-CM | POA: Diagnosis present

## 2017-06-19 DIAGNOSIS — Z8739 Personal history of other diseases of the musculoskeletal system and connective tissue: Secondary | ICD-10-CM | POA: Diagnosis not present

## 2017-06-19 DIAGNOSIS — D62 Acute posthemorrhagic anemia: Secondary | ICD-10-CM

## 2017-06-19 DIAGNOSIS — Z79899 Other long term (current) drug therapy: Secondary | ICD-10-CM

## 2017-06-19 DIAGNOSIS — B379 Candidiasis, unspecified: Secondary | ICD-10-CM

## 2017-06-19 DIAGNOSIS — R269 Unspecified abnormalities of gait and mobility: Secondary | ICD-10-CM

## 2017-06-19 DIAGNOSIS — S76112D Strain of left quadriceps muscle, fascia and tendon, subsequent encounter: Principal | ICD-10-CM

## 2017-06-19 DIAGNOSIS — Q231 Congenital insufficiency of aortic valve: Secondary | ICD-10-CM

## 2017-06-19 DIAGNOSIS — Z862 Personal history of diseases of the blood and blood-forming organs and certain disorders involving the immune mechanism: Secondary | ICD-10-CM | POA: Diagnosis not present

## 2017-06-19 DIAGNOSIS — E46 Unspecified protein-calorie malnutrition: Secondary | ICD-10-CM | POA: Diagnosis present

## 2017-06-19 DIAGNOSIS — M170 Bilateral primary osteoarthritis of knee: Secondary | ICD-10-CM | POA: Diagnosis present

## 2017-06-19 DIAGNOSIS — K449 Diaphragmatic hernia without obstruction or gangrene: Secondary | ICD-10-CM | POA: Diagnosis present

## 2017-06-19 DIAGNOSIS — Z8639 Personal history of other endocrine, nutritional and metabolic disease: Secondary | ICD-10-CM | POA: Diagnosis not present

## 2017-06-19 DIAGNOSIS — G8918 Other acute postprocedural pain: Secondary | ICD-10-CM | POA: Diagnosis not present

## 2017-06-19 DIAGNOSIS — K5909 Other constipation: Secondary | ICD-10-CM | POA: Diagnosis not present

## 2017-06-19 DIAGNOSIS — T50904A Poisoning by unspecified drugs, medicaments and biological substances, undetermined, initial encounter: Secondary | ICD-10-CM | POA: Diagnosis not present

## 2017-06-19 MED ORDER — ASPIRIN 325 MG PO TABS
325.0000 mg | ORAL_TABLET | Freq: Every day | ORAL | Status: DC
  Administered 2017-06-20 – 2017-06-26 (×7): 325 mg via ORAL
  Filled 2017-06-19 (×7): qty 1

## 2017-06-19 MED ORDER — PROCHLORPERAZINE 25 MG RE SUPP
12.5000 mg | Freq: Four times a day (QID) | RECTAL | Status: DC | PRN
  Filled 2017-06-19: qty 1

## 2017-06-19 MED ORDER — ADULT MULTIVITAMIN W/MINERALS CH
1.0000 | ORAL_TABLET | Freq: Every day | ORAL | Status: DC
  Administered 2017-06-20 – 2017-06-27 (×8): 1 via ORAL
  Filled 2017-06-19 (×8): qty 1

## 2017-06-19 MED ORDER — PROCHLORPERAZINE MALEATE 5 MG PO TABS: 5.0000 mg | ORAL_TABLET | Freq: Four times a day (QID) | ORAL | Status: DC | PRN

## 2017-06-19 MED ORDER — METHOCARBAMOL 500 MG PO TABS
500.0000 mg | ORAL_TABLET | Freq: Four times a day (QID) | ORAL | Status: DC | PRN
  Administered 2017-06-19 – 2017-06-24 (×8): 500 mg via ORAL
  Filled 2017-06-19 (×8): qty 1

## 2017-06-19 MED ORDER — COLCHICINE 0.6 MG PO TABS: 0.6000 mg | ORAL_TABLET | Freq: Every day | ORAL | Status: DC | PRN

## 2017-06-19 MED ORDER — BISACODYL 10 MG RE SUPP
10.0000 mg | Freq: Every day | RECTAL | Status: DC | PRN
  Administered 2017-06-20: 10 mg via RECTAL
  Filled 2017-06-19 (×2): qty 1

## 2017-06-19 MED ORDER — ALUM & MAG HYDROXIDE-SIMETH 200-200-20 MG/5ML PO SUSP
30.0000 mL | ORAL | Status: DC | PRN
  Administered 2017-06-26: 30 mL via ORAL
  Filled 2017-06-19: qty 30

## 2017-06-19 MED ORDER — METOPROLOL SUCCINATE ER 25 MG PO TB24
25.0000 mg | ORAL_TABLET | Freq: Every day | ORAL | Status: DC
  Administered 2017-06-20 – 2017-06-27 (×8): 25 mg via ORAL
  Filled 2017-06-19 (×8): qty 1

## 2017-06-19 MED ORDER — PANTOPRAZOLE SODIUM 40 MG PO TBEC
40.0000 mg | DELAYED_RELEASE_TABLET | Freq: Every day | ORAL | Status: DC
  Administered 2017-06-20 – 2017-06-27 (×8): 40 mg via ORAL
  Filled 2017-06-19 (×8): qty 1

## 2017-06-19 MED ORDER — GUAIFENESIN-DM 100-10 MG/5ML PO SYRP: 5.0000 mL | ORAL_SOLUTION | Freq: Four times a day (QID) | ORAL | Status: DC | PRN

## 2017-06-19 MED ORDER — ACETAMINOPHEN 325 MG PO TABS
325.0000 mg | ORAL_TABLET | ORAL | Status: DC | PRN
  Filled 2017-06-19: qty 2

## 2017-06-19 MED ORDER — ENOXAPARIN SODIUM 40 MG/0.4ML ~~LOC~~ SOLN
40.0000 mg | SUBCUTANEOUS | Status: DC
  Administered 2017-06-19 – 2017-06-26 (×8): 40 mg via SUBCUTANEOUS
  Filled 2017-06-19 (×8): qty 0.4

## 2017-06-19 MED ORDER — ASPIRIN 325 MG PO TABS: 325.0000 mg | ORAL_TABLET | Freq: Every day | ORAL | 0 refills | Status: DC

## 2017-06-19 MED ORDER — ALLOPURINOL 300 MG PO TABS
300.0000 mg | ORAL_TABLET | Freq: Every day | ORAL | Status: DC
  Administered 2017-06-20 – 2017-06-27 (×8): 300 mg via ORAL
  Filled 2017-06-19 (×8): qty 1

## 2017-06-19 MED ORDER — POLYETHYLENE GLYCOL 3350 17 G PO PACK
17.0000 g | PACK | Freq: Two times a day (BID) | ORAL | Status: DC
  Administered 2017-06-20 – 2017-06-26 (×14): 17 g via ORAL
  Filled 2017-06-19 (×14): qty 1

## 2017-06-19 MED ORDER — SORBITOL 70 % SOLN
30.0000 mL | Status: AC
  Administered 2017-06-19: 30 mL via ORAL
  Filled 2017-06-19: qty 30

## 2017-06-19 MED ORDER — TRAMADOL HCL 50 MG PO TABS
50.0000 mg | ORAL_TABLET | Freq: Four times a day (QID) | ORAL | Status: DC | PRN
  Administered 2017-06-20 – 2017-06-26 (×7): 50 mg via ORAL
  Filled 2017-06-19 (×9): qty 1

## 2017-06-19 MED ORDER — POLYETHYLENE GLYCOL 3350 17 G PO PACK
17.0000 g | PACK | Freq: Once | ORAL | Status: AC
  Administered 2017-06-19: 17 g via ORAL
  Filled 2017-06-19: qty 1

## 2017-06-19 MED ORDER — DIPHENHYDRAMINE HCL 12.5 MG/5ML PO ELIX
12.5000 mg | ORAL_SOLUTION | Freq: Four times a day (QID) | ORAL | Status: DC | PRN
  Administered 2017-06-20 – 2017-06-23 (×2): 25 mg via ORAL
  Filled 2017-06-19 (×2): qty 10

## 2017-06-19 MED ORDER — TRAZODONE HCL 50 MG PO TABS
50.0000 mg | ORAL_TABLET | Freq: Every evening | ORAL | Status: DC | PRN
  Administered 2017-06-19: 50 mg via ORAL
  Filled 2017-06-19: qty 1

## 2017-06-19 MED ORDER — HYDROCODONE-ACETAMINOPHEN 5-325 MG PO TABS
1.0000 | ORAL_TABLET | ORAL | Status: DC | PRN
  Administered 2017-06-19 – 2017-06-21 (×6): 2 via ORAL
  Administered 2017-06-21 – 2017-06-22 (×2): 1 via ORAL
  Administered 2017-06-22 – 2017-06-23 (×3): 2 via ORAL
  Administered 2017-06-23 (×2): 1 via ORAL
  Administered 2017-06-24 – 2017-06-25 (×5): 2 via ORAL
  Administered 2017-06-26: 1 via ORAL
  Filled 2017-06-19: qty 1
  Filled 2017-06-19 (×4): qty 2
  Filled 2017-06-19 (×3): qty 1
  Filled 2017-06-19 (×3): qty 2
  Filled 2017-06-19 (×3): qty 1
  Filled 2017-06-19 (×7): qty 2

## 2017-06-19 MED ORDER — OXYCODONE HCL 5 MG PO TABS: 5.0000 mg | ORAL_TABLET | ORAL | 0 refills | Status: DC | PRN

## 2017-06-19 MED ORDER — PROCHLORPERAZINE EDISYLATE 5 MG/ML IJ SOLN: 5.0000 mg | Freq: Four times a day (QID) | INTRAMUSCULAR | Status: DC | PRN

## 2017-06-19 MED ORDER — METHOCARBAMOL 500 MG PO TABS: 500.0000 mg | ORAL_TABLET | Freq: Four times a day (QID) | ORAL | 0 refills | Status: DC | PRN

## 2017-06-19 MED ORDER — FLEET ENEMA 7-19 GM/118ML RE ENEM: 1.0000 | ENEMA | Freq: Once | RECTAL | Status: DC | PRN

## 2017-06-19 NOTE — H&P (Signed)
Physical Medicine and Rehabilitation Admission H&P    Chief Complaint  Patient presents with  . Bilateral quad tendon rupture.     HPI:  Jose [name] is a 66 y.o. male with history of SDH, OA bilateral knees, history of falls, partial quad rupture on left but functional. He was admitted on 06/15/17 with recurrent fall with pain and inability to extend either knee due to bilateral quad tendon rupture. He underwent quad tendon repair by Dr. Marlou Sa and is to wear B-KI at all times. Therapy evaluations done and patient with deficits in mobility and ability to carry out ADL tasks.  CIR recommended for follow up therapy   Review of Systems  HENT: Positive for hearing loss (mild). Negative for tinnitus.   Eyes: Negative for blurred vision and double vision.  Respiratory: Negative for cough and shortness of breath.   Cardiovascular: Negative for chest pain and palpitations.  Gastrointestinal: Positive for constipation. Negative for heartburn and nausea.  Genitourinary: Negative for dysuria and urgency.  Musculoskeletal: Positive for joint pain (bilateral knees) and myalgias.  Skin: Negative for itching and rash.  Neurological: Positive for headaches (no meds). Negative for dizziness, sensory change, speech change and weakness.  Psychiatric/Behavioral: The patient has insomnia.      Past Medical History:  Diagnosis Date  . Arthritis    R shoulder, great toes, hands- has gout & has injections in knees with Dr. Estanislado Pandy   . Cancer ([place])    skin ca-   . GERD (gastroesophageal reflux disease)   . History of hiatal hernia   . SDH (subdural hematoma) ([facility])     Past Surgical History:  Procedure Laterality Date  . BRAIN SURGERY  2016   evacuation of SDH  . CARPAL TUNNEL RELEASE Bilateral   . GREEN LIGHT LASER TURP (TRANSURETHRAL RESECTION OF PROSTATE  04/27/2017  . QUADRICEPS TENDON REPAIR Bilateral 06/15/2017   Procedure: REPAIR QUADRICEP TENDON;  Surgeon: Meredith Pel, MD;   Location: [facility];  Service: Orthopedics;  Laterality: Bilateral;  . SHOULDER SURGERY Right     Family History  Problem Relation Age of Onset  . Parkinson's disease Mother   . Heart disease Father   . Parkinson's disease Brother   . Diabetes Brother     Social History:  Lives alone. 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 He reports that he has been smoking Cigars.  He has never used smokeless tobacco. He reports that he drinks about twice a week--wine and/or liquor. He reports that he does not use drugs.    Allergies: No Known Allergies    Medications Prior to Admission  Medication Sig Dispense Refill  . acetaminophen (TYLENOL) 500 MG tablet Take 500-1,000 mg by mouth every 6 (six) hours as needed for mild pain.    Marland Kitchen allopurinol (ZYLOPRIM) 300 MG tablet TAKE 1 TABLET BY MOUTH EVERY DAY 90 tablet 1  . colchicine 0.6 MG tablet Take 1 tablet (0.6 mg total) by mouth daily as needed (FOR GOUT FLAREUPS). 30 tablet 3  . esomeprazole (NEXIUM) 40 MG capsule Take 1 capsule (40 mg total) by mouth daily. 30 capsule 0  . metoprolol succinate (TOPROL-XL) 25 MG 24 hr tablet Take 1 tablet (25 mg total) by mouth daily. Take with or immediately following a meal. 90 tablet 3  . Multiple Vitamin (MULTIVITAMIN WITH MINERALS) TABS tablet Take 1 tablet by mouth daily.    . cephALEXin (KEFLEX) 500 MG capsule Take 1 capsule (500 mg total) by mouth 4 (four) times daily. (  Patient not taking: Reported on 06/15/2017) 20 capsule 0    Home: Home Living Family/patient expects to be discharged to:: Private residence Living Arrangements: Alone Available Help at Discharge: Friend(s), Available PRN/intermittently Type of Home: Apartment Home Access: Stairs to enter [employer] of Steps: 17 Entrance Stairs-Rails: Right, Left Home Layout: One level Bathroom Shower/Tub: Chiropodist: Standard Bathroom Accessibility: Yes Home Equipment: None Additional Comments: will have (A) from  "friend" never called her girlfriend but did exchange a kiss lip to lip with "friend" . Friend present reports she will be (A)ing up d/c    Functional History: Prior Function Level of Independence: Independent Comments: completely independent, active, driving, still working full time.   Functional Status:  Mobility: Bed Mobility Overal bed mobility: Needs Assistance Bed Mobility: Sit to Supine Supine to sit: Min assist, HOB elevated Sit to supine: Mod assist General bed mobility comments: Pt OOB in chair upon arrival Transfers Overall transfer level: Needs assistance Equipment used: Rolling walker (2 wheeled) Transfers: Sit to/from Stand Sit to Stand: Min guard General transfer comment: min guard for sit to stand from chair. Pt with good hand placement and technique with RW Ambulation/Gait Ambulation/Gait assistance: Min assist Ambulation Distance (Feet): 90 Feet Assistive device: Rolling walker (2 wheeled) Gait Pattern/deviations: Step-to pattern, Decreased stride length, Step-through pattern General Gait Details: gait with bilateral Bledsoe braces and lowered walker to allow improved step length and able to bear weight on walker Gait velocity: Did not attempt this session    ADL: ADL Overall ADL's : Needs assistance/impaired Eating/Feeding: Independent Grooming: Wash/dry hands, Wash/dry face, Oral care, Min guard Upper Body Bathing: Supervision/ safety Lower Body Bathing: Moderate assistance, Sit to/from stand Lower Body Dressing: Maximal assistance Lower Body Dressing Details (indicate cue type and reason): to don/doff socks Toilet Transfer: Min guard, Ambulation, RW Toilet Transfer Details (indicate cue type and reason): Simulated by sit to stand from chair with functional mobility. Functional mobility during ADLs: Min guard, Rolling walker General ADL Comments: pt requires (A) to power up from chair and toilet. Pt able to progress from toilet with less (A) than chair.  pt works for [employer] and already expressed interest in Teacher, English as a foreign language  Cognition: Cognition Overall Cognitive Status: Within Functional Limits for tasks assessed Orientation Level: Oriented X4 Cognition Arousal/Alertness: Awake/alert Behavior During Therapy: WFL for tasks assessed/performed Overall Cognitive Status: Within Functional Limits for tasks assessed   Blood pressure 123/76, pulse 64, temperature 98.6 F (37 C), temperature source Oral, resp. rate 18, height 6' (1.829 m), weight 89.4 kg (197 lb), SpO2 96 %. Physical Exam  Nursing note and vitals reviewed. Constitutional: He is oriented to person, place, and time. He appears well-developed and well-nourished. He appears distressed.  HENT:  Head: Normocephalic and atraumatic.  Eyes: Conjunctivae and EOM are normal. Pupils are equal, round, and reactive to light.  Neck: Normal range of motion. Neck supple.  Cardiovascular: Normal rate and regular rhythm.   Respiratory: Effort normal and breath sounds normal. No stridor.  GI: Soft. Bowel sounds are normal. He exhibits no distension. There is no tenderness.  Musculoskeletal: He exhibits edema (min edema BLE).  Bilateral knees with surgical dressing in place--dry and no drainage noted.  Neurological: He is alert and oriented to person, place, and time.  Skin: Skin is warm and dry. He is not diaphoretic.  Psychiatric: He has a normal mood and affect. His behavior is normal. Judgment and thought content normal.    No results found for this or any  previous visit (from the past 48 hour(s)). No results found.     Medical Problem List and Plan: 1.  Functional and mobility deficits secondary to bilateral quad tendon ruptures  -admit to inpatient rehab 2.  DVT Prophylaxis/Anticoagulation: Pharmaceutical: Lovenox 3. Pain Management: will continue hydrocodone prn with robaxin for muscle spams. Encourage use of ice and heat.  4. Mood: LCSW to follow for evaluation and  support.  5. Neuropsych: This patient is capable of making decisions on his own behalf. 6. Skin/Wound Care: monitor incisions daily for healing. Maintain adequate nutritional and hydration status.  7. Fluids/Electrolytes/Nutrition: monitor I/O. Check lytes in am 8. Constipation: Augment bowel program---sorbitol today.  9. GERD: on protonix.  10. H/o gout: controlled on allopurinol.  11. Ascending Aortic dilations/ Bicuspid aortic valve: on low dose BB   Post Admission Physician Evaluation: 1. Functional deficits secondary  to bilateral quad tendon ruptures. 2. Patient is admitted to receive collaborative, interdisciplinary care between the physiatrist, rehab nursing staff, and therapy team. 3. Patient's level of medical complexity and substantial therapy needs in context of that medical necessity cannot be provided at a lesser intensity of care such as a SNF. 4. Patient has experienced substantial functional loss from his/her baseline which was documented above under the "Functional History" and "Functional Status" headings.  Judging by the patient's diagnosis, physical exam, and functional history, the patient has potential for functional progress which will result in measurable gains while on inpatient rehab.  These gains will be of substantial and practical use upon discharge  in facilitating mobility and self-care at the household level. 5. Physiatrist will provide 24 hour management of medical needs as well as oversight of the therapy plan/treatment and provide guidance as appropriate regarding the interaction of the two. 6. The Preadmission Screening has been reviewed and patient status is unchanged unless otherwise stated above. 7. 24 hour rehab nursing will assist with bladder management, bowel management, safety, skin/wound care, disease management, medication administration, pain management and patient education  and help integrate therapy concepts, techniques,education, etc. 8. PT will  assess and treat for/with: Lower extremity strength, range of motion, stamina, balance, functional mobility, safety, adaptive techniques and equipment, ortho precautions, pain control.   Goals are: mod I. 9. OT will assess and treat for/with: ADL's, functional mobility, safety, upper extremity strength, adaptive techniques and equipment, pain mgt, ortho precautions.   Goals are: mod I to set up. Therapy may proceed with showering this patient. 10. SLP will assess and treat for/with: n/a.  Goals are: n/a. 11. Case Management and Social Worker will assess and treat for psychological issues and discharge planning. 12. Team conference will be held weekly to assess progress toward goals and to determine barriers to discharge. 13. Patient will receive at least 3 hours of therapy per day at least 5 days per week. 14. ELOS: 6-10 days       15. Prognosis:  excellent     Meredith Staggers, MD, [facility] 06/19/2017  Bary Leriche, Hershal Coria 06/19/2017

## 2017-06-19 NOTE — Progress Notes (Signed)
Rehab admissions - I was asked to reconsider inpatient rehab admission by PT, social worker, by patient and by attending MD.  Patient feels strongly that he would like to remain in the hospital for his rehab.  I have asked rehab MD to take a second look at patient to see if we can reconsider inpatient rehab admission.  I will update all once I have feedback from rehab MD.  Call me for questions.  #[phone]

## 2017-06-19 NOTE — Discharge Summary (Signed)
Physician Discharge Summary  Patient ID: SHED NIXON MRN: [id number] DOB/AGE: 68-May-1952 68 y.o.  Admit date: 06/15/2017 Discharge date: 06/19/2017  Admission Diagnoses:  Active Problems:   Quadriceps tendon rupture   Fall   History of subdural hematoma   Post-operative pain   Recurrent falls   Discharge Diagnoses:  Same  Surgeries: Procedure(s): REPAIR QUADRICEP TENDON on 06/15/2017   Consultants:   Discharged Condition: Stable  Hospital Course: Tedric Leeth Tisdell is an 66 y.o. male who was admitted 06/15/2017 with a chief complaint of  Chief Complaint  Patient presents with  . Fall  . Knee Pain  , and found to have a diagnosis of Bilateral quadriceps tendon rupture.  They were brought to the operating room on 06/15/2017 and underwent the above named procedures.  Patient tolerated the procedure well and was kept in knee immobilizers for the first 2 days and then transition to Bledsoe brace locked in extension.  He was allowed to be weightbearing as tolerated both lower extremities with the leg locked in extension.  Ultrasound was obtained postop day #2 for DVT which was negative.  Patient does have a history of subdural hematoma which was spontaneous.  Therefore decision was made to use aspirin for DVT prophylaxis as opposed to one of the other agents.  He is discharged in good condition.  He is making progress in therapy.  He will follow-up with me in 7 days.  At that time I'll unlock the Bledsoe brace is to let him bend 0-30.  Antibiotics given:  Anti-infectives    Start     Dose/Rate Route Frequency Ordered Stop   06/16/17 0200  ceFAZolin (ANCEF) IVPB 1 g/50 mL premix     1 g 100 mL/hr over 30 Minutes Intravenous Every 6 hours 06/16/17 0011 06/16/17 0913   06/15/17 1800  ceFAZolin (ANCEF) IVPB 2g/100 mL premix     2 g 200 mL/hr over 30 Minutes Intravenous On call to O.R. 06/15/17 1753 06/15/17 2021    .  Recent vital signs:  Vitals:   06/19/17 0517 06/19/17 1424  BP:  123/76 132/76  Pulse: 64 65  Resp: 18   Temp: 98.6 F (37 C) 98.7 F (37.1 C)    Recent laboratory studies:  Results for orders placed or performed during the hospital encounter of 06/15/17  Comprehensive metabolic panel  Result Value Ref Range   Sodium 139 135 - 145 mmol/L   Potassium 3.5 3.5 - 5.1 mmol/L   Chloride 104 101 - 111 mmol/L   CO2 25 22 - 32 mmol/L   Glucose, Bld 99 65 - 99 mg/dL   BUN 11 6 - 20 mg/dL   Creatinine, Ser 1.01 0.61 - 1.24 mg/dL   Calcium 9.2 8.9 - 10.3 mg/dL   Total Protein 6.9 6.5 - 8.1 g/dL   Albumin 4.0 3.5 - 5.0 g/dL   AST 29 15 - 41 U/L   ALT 32 17 - 63 U/L   Alkaline Phosphatase 56 38 - 126 U/L   Total Bilirubin 0.6 0.3 - 1.2 mg/dL   GFR calc non Af Amer >60 >60 mL/min   GFR calc Af Amer >60 >60 mL/min   Anion gap 10 5 - 15  CBC  Result Value Ref Range   WBC 9.3 4.0 - 10.5 K/uL   RBC 4.83 4.22 - 5.81 MIL/uL   Hemoglobin 15.3 13.0 - 17.0 g/dL   HCT 45.2 39.0 - 52.0 %   MCV 93.6 78.0 - 100.0 fL  MCH 31.7 26.0 - 34.0 pg   MCHC 33.8 30.0 - 36.0 g/dL   RDW 13.7 11.5 - 15.5 %   Platelets 191 150 - 400 K/uL    Discharge Medications:   Allergies as of 06/19/2017   No Known Allergies     Medication List    STOP taking these medications   cephALEXin 500 MG capsule Commonly known as:  KEFLEX     TAKE these medications   acetaminophen 500 MG tablet Commonly known as:  TYLENOL Take 500-1,000 mg by mouth every 6 (six) hours as needed for mild pain.   allopurinol 300 MG tablet Commonly known as:  ZYLOPRIM TAKE 1 TABLET BY MOUTH EVERY DAY   aspirin 325 MG tablet Take 1 tablet (325 mg total) by mouth daily. Start taking on:  06/20/2017   colchicine 0.6 MG tablet Take 1 tablet (0.6 mg total) by mouth daily as needed (FOR GOUT FLAREUPS).   esomeprazole 40 MG capsule Commonly known as:  NEXIUM Take 1 capsule (40 mg total) by mouth daily.   methocarbamol 500 MG tablet Commonly known as:  ROBAXIN Take 1 tablet (500 mg total) by mouth  every 6 (six) hours as needed for muscle spasms.   metoprolol succinate 25 MG 24 hr tablet Commonly known as:  TOPROL-XL Take 1 tablet (25 mg total) by mouth daily. Take with or immediately following a meal.   multivitamin with minerals Tabs tablet Take 1 tablet by mouth daily.   oxyCODONE 5 MG immediate release tablet Commonly known as:  Oxy IR/ROXICODONE Take 1-2 tablets (5-10 mg total) by mouth every 3 (three) hours as needed for breakthrough pain.       Diagnostic Studies: Mr Knee Right Wo Contrast  Result Date: 06/15/2017 CLINICAL DATA:  [place] on wet hardwood floor, then after arriving to ED, Pt found lying on R side on floor in Radiology. EXAM: MRI OF THE RIGHT KNEE WITHOUT CONTRAST TECHNIQUE: Multiplanar, multisequence MR imaging of the knee was performed. No intravenous contrast was administered. COMPARISON:  None. FINDINGS: MENISCI Medial meniscus: Small undersurface tear of the posterior horn of the medial meniscus. Lateral meniscus:  Intact. LIGAMENTS Cruciates:  Intact ACL and PCL. Collaterals: Medial collateral ligament is intact. Lateral collateral ligament complex is intact. CARTILAGE Patellofemoral: Partial-thickness cartilage loss of the lateral patellofemoral compartment. Medial: Mild partial-thickness cartilage loss of the medial femorotibial compartment. Lateral: Partial-thickness cartilage loss of the lateral femorotibial compartment. Joint: Small joint effusion. Normal Hoffa's fat. No plical thickening. Popliteal Fossa: No significant Baker cyst. Intact popliteus tendon. Extensor Mechanism: Complete tear of the quadriceps tendon at its insertion with 10 mm of retraction. Intact patellar tendon. Intact medial and lateral patellar retinaculum. TT -TG distance 18 mm. Bones: Lateral patellar tilting. No acute osseous abnormality. No fracture or dislocation. No aggressive osseous lesion. Other: No fluid collection or hematoma. IMPRESSION: 1. Complete tear of the quadriceps tendon at  its insertion with 10 mm of retraction. 2. Small undersurface tear of the posterior horn of the medial meniscus. Electronically Signed   By: Kathreen Devoid   On: 06/15/2017 14:21   Dg Knee Complete 4 Views Left  Result Date: 06/15/2017 CLINICAL DATA:  Fall today with bilateral knee pain, initial encounter EXAM: LEFT KNEE - COMPLETE 4+ VIEW COMPARISON:  04/26/17 FINDINGS: No acute fracture or dislocation is noted. Thinning of the quadriceps tendon is noted consistent with the patient's given clinical history. No joint effusion is seen. IMPRESSION: No acute abnormality noted. Electronically Signed   By: Elta Guadeloupe  Lukens M.D.   On: 06/15/2017 09:43   Dg Knee Complete 4 Views Right  Result Date: 06/15/2017 CLINICAL DATA:  Recent fall with right knee pain, initial encounter EXAM: RIGHT KNEE - COMPLETE 4+ VIEW COMPARISON:  None. FINDINGS: No evidence of fracture, dislocation, or joint effusion. No evidence of arthropathy or other focal bone abnormality. Soft tissues are unremarkable. IMPRESSION: No acute abnormality noted. Electronically Signed   By: Inez Catalina M.D.   On: 06/15/2017 09:43   Dg Hip Unilat W Or Wo Pelvis 2-3 Views Right  Result Date: 06/15/2017 CLINICAL DATA:  Fall at home this a.m landing onto bilat knees and then fell in ED transferring from bed to w/c complains of generalized rt hip pain, ltd rom due to knee pain EXAM: DG HIP (WITH OR WITHOUT PELVIS) 2-3V RIGHT COMPARISON:  None. FINDINGS: Hips are located. No evidence of pelvic fracture or sacral fracture. Dedicated view of the RIGHT hip demonstrates no femoral neck fracture. IMPRESSION: No pelvic fracture or RIGHT hip fracture Electronically Signed   By: Suzy Bouchard M.D.   On: 06/15/2017 11:17    Disposition: 01-Home or Self Care  Discharge Instructions    Call MD / Call 911    Complete by:  As directed    If you experience chest pain or shortness of breath, CALL 911 and be transported to the hospital emergency room.  If you  develope a fever above 101 F, pus (white drainage) or increased drainage or redness at the wound, or calf pain, call your surgeon's office.   Constipation Prevention    Complete by:  As directed    Drink plenty of fluids.  Prune juice may be helpful.  You may use a stool softener, such as Colace (over the counter) 100 mg twice a day.  Use MiraLax (over the counter) for constipation as needed.   Diet - low sodium heart healthy    Complete by:  As directed    Discharge instructions    Complete by:  As directed    Weightbearing as tolerated with Bledsoe brace is locked in extension for both legs Okay to shower with leg straight Okay for straight leg raises as exercise but don't bend the knee until I see him back in clinic Okay to remove the Bledsoe braces when the patient is reclining in bed Continue one aspirin a day for DVT prophylaxis   Increase activity slowly as tolerated    Complete by:  As directed          Signed: Anderson Malta 06/19/2017, 3:48 PM

## 2017-06-19 NOTE — Progress Notes (Signed)
Jamse Arn, MD Physician Addendum Physical Medicine and Rehabilitation  Consult Note Date of Service: 06/17/2017 12:01 PM  Related encounter: ED to Hosp-Admission (Current) from 06/15/2017 in [facility] All Collapse All   [] Hide copied text [] Hover for attribution information      Physical Medicine and Rehabilitation Consult  Reason for Consult:  Bilateral quad tendon rupture Referring Physician: Dr. Marlou Sa   HPI: Jose [name] is a 66 y.o. male with history of SDH, multiple falls, partial quad rupture on left but functional. He was admitted on 06/15/17 with recurrent fall with pain and inability to extend either knee due to bilateral quad tendon rupture. He underwent quad tendon repair by Dr. Marlou Sa and is to wear B-KI at all times. Therapy evaluations done and patient limited by B-KI and difficulty to WB due to anxiety/fear CIR recommended for follow up therapy.  Patient lives alone in an apartment with 17 stairs at entry.  Reports that he is making plans to stay with a friend after discharge.  He was  independent and working PTA. Needed min assist to transfer and walked 25 ' with mobility tech this am.    Review of Systems  HENT: Negative for hearing loss and tinnitus.   Eyes: Negative for blurred vision and double vision.  Respiratory: Negative for cough and shortness of breath.   Cardiovascular: Negative for chest pain and palpitations.  Gastrointestinal: Positive for constipation (no BM since 6/29). Negative for heartburn and nausea.  Genitourinary: Negative for dysuria and urgency.  Musculoskeletal: Positive for joint pain. Negative for myalgias.  Skin: Negative for rash.  Neurological: Negative for dizziness and headaches.  Psychiatric/Behavioral: Negative for hallucinations. The patient has insomnia. The patient is not nervous/anxious.   All other systems reviewed and are negative.         Past Medical History:   Diagnosis Date  . Arthritis    R shoulder, great toes, hands- has gout & has injections in knees with Dr. Estanislado Pandy   . Cancer ([facility])    skin ca-   . GERD (gastroesophageal reflux disease)   . History of hiatal hernia   . SDH (subdural hematoma) ([facility])          Past Surgical History:  Procedure Laterality Date  . BRAIN SURGERY  2016   evacuation of SDH  . CARPAL TUNNEL RELEASE Bilateral   . GREEN LIGHT LASER TURP (TRANSURETHRAL RESECTION OF PROSTATE  04/27/2017  . SHOULDER SURGERY Right          Family History  Problem Relation Age of Onset  . Parkinson's disease Mother   . Heart disease Father   . Parkinson's disease Brother   . Diabetes Brother      Social History:  Lives alone. 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 He reports that he has been smoking Cigars.  He has never used smokeless tobacco. He reports that he drinks about 1.8 oz of alcohol per week . He reports that he does not use drugs.    Allergies: No Known Allergies          Medications Prior to Admission  Medication Sig Dispense Refill  . acetaminophen (TYLENOL) 500 MG tablet Take 500-1,000 mg by mouth every 6 (six) hours as needed for mild pain.    Marland Kitchen allopurinol (ZYLOPRIM) 300 MG tablet TAKE 1 TABLET BY MOUTH EVERY DAY 90 tablet 1  . colchicine 0.6 MG tablet Take 1 tablet (0.6 mg total) by mouth  daily as needed (FOR GOUT FLAREUPS). 30 tablet 3  . esomeprazole (NEXIUM) 40 MG capsule Take 1 capsule (40 mg total) by mouth daily. 30 capsule 0  . metoprolol succinate (TOPROL-XL) 25 MG 24 hr tablet Take 1 tablet (25 mg total) by mouth daily. Take with or immediately following a meal. 90 tablet 3  . Multiple Vitamin (MULTIVITAMIN WITH MINERALS) TABS tablet Take 1 tablet by mouth daily.    . cephALEXin (KEFLEX) 500 MG capsule Take 1 capsule (500 mg total) by mouth 4 (four) times daily. (Patient not taking: Reported on 06/15/2017) 20 capsule 0    Home: Home Living Family/patient  expects to be discharged to:: Private residence Living Arrangements: Alone Available Help at Discharge: Friend(s), Available PRN/intermittently Type of Home: Apartment Home Access: Stairs to enter [employer] of Steps: 17 Entrance Stairs-Rails: Right, Left Home Layout: One level Bathroom Shower/Tub: Research officer, trade union Accessibility: Yes  Functional History: Prior Function Level of Independence: Independent Comments: completely independent, active, driving, still working full time.  Functional Status:  Mobility: Bed Mobility Overal bed mobility: Needs Assistance Bed Mobility: Supine to Sit, Sit to Supine Supine to sit: Min assist, HOB elevated Sit to supine: Min assist General bed mobility comments: MIn A to bring LE's into and out of bed with use of railings Transfers Overall transfer level: Needs assistance Equipment used: Rolling walker (2 wheeled) Transfers: Sit to/from Stand Sit to Stand: Mod assist, +2 physical assistance, From elevated surface General transfer comment: +2 A from elevated bed due to immobilization of bilateral knees. Pt becomes anxious with weight bearing due to multiple falls.  Ambulation/Gait Gait velocity: Did not attempt this session  ADL:  Cognition: Cognition Overall Cognitive Status: Within Functional Limits for tasks assessed Orientation Level: Oriented X4 Cognition Arousal/Alertness: Awake/alert Behavior During Therapy: WFL for tasks assessed/performed Overall Cognitive Status: Within Functional Limits for tasks assessed  Blood pressure 134/71, pulse 66, temperature 99.1 F (37.3 C), resp. rate 19, height 6' (1.829 m), weight 89.4 kg (197 lb), SpO2 96 %. Physical Exam  Nursing note and vitals reviewed. Constitutional: He is oriented to person, place, and time. He appears well-developed and well-nourished.  HENT:  Head: Normocephalic and atraumatic.  Mouth/Throat: Oropharynx is clear and moist.  Eyes: Conjunctivae and  EOM are normal. Pupils are equal, round, and reactive to light.  Neck: Normal range of motion. Neck supple.  Cardiovascular: Normal rate and regular rhythm.   Respiratory: Effort normal and breath sounds normal. No stridor. No respiratory distress. He has no wheezes.  GI: Soft. Bowel sounds are normal. He exhibits no distension. There is no tenderness.  Musculoskeletal: He exhibits edema and tenderness.  Neurological: He is alert and oriented to person, place, and time.  Motor: B/l UE 5/5 proximal to distal B/l LE: HF 4/5, ADF/PF 5/5 Sensation subjectively diminished to light touch b/l toes  Skin: Skin is warm and dry.  Psychiatric: He has a normal mood and affect. His behavior is normal.    Lab Results Last 24 Hours  No results found for this or any previous visit (from the past 24 hour(s)).   Imaging Results (Last 48 hours)  No results found.    Assessment/Plan: Diagnosis: B/l quad tendon rupture s/p repair Labs independently reviewed.  Records reviewed and summated above.  1. Does the need for close, 24 hr/day medical supervision in concert with the patient's rehab needs make it unreasonable for this patient to be served in a less intensive setting? No  2. Co-Morbidities requiring supervision/potential  complications: recurrent falls, SDH, post-op pain (Biofeedback training with therapies to help reduce reliance on opiate pain medications, particularly IV toradol, monitor pain control during therapies, and sedation at rest and titrate to maximum efficacy to ensure participation and gains in therapies) 3. Due to safety, skin/wound care, disease management, pain management and patient education, does the patient require 24 hr/day rehab nursing? Potentially 4. Does the patient require coordinated care of a physician, rehab nurse, PT (1-2 hrs/day, 5 days/week) and OT (1-2 hrs/day, 5 days/week) to address physical and functional deficits in the context of the above medical diagnosis(es)?  Yes Addressing deficits in the following areas: balance, endurance, locomotion, strength, transferring, bathing, dressing, toileting and psychosocial support 5. Can the patient actively participate in an intensive therapy program of at least 3 hrs of therapy per day at least 5 days per week? Yes 6. The potential for patient to make measurable gains while on inpatient rehab is excellent 7. Anticipated functional outcomes upon discharge from inpatient rehab are modified independent and supervision  with PT, modified independent and supervision with OT, n/a with SLP. 8. Estimated rehab length of stay to reach the above functional goals is: 6-9 days. 9. Anticipated D/C setting: Home 10. Anticipated post D/C treatments: Outpatient therapy 11. Overall Rehab/Functional Prognosis: excellent  RECOMMENDATIONS: This patient's condition is appropriate for continued rehabilitative care in the following setting: Pt appears to be making good functional progress. Further, pt does not have a medical need that would warrant IRF.  Recommend home with outpatient therapies. Patient has agreed to participate in recommended program. Potentially Note that insurance prior authorization may be required for reimbursement for recommended care.  Comment: Rehab Admissions Coordinator to follow up.  Delice Lesch, MD, Mellody Drown 06/17/17 Bary Leriche, PA-C 06/18/2017    Revision History                             Routing History

## 2017-06-19 NOTE — Clinical Social Work Note (Addendum)
CSW consulted for "Skilled nursing facility placement." CSW met with pt in the room to discuss SNF placement. Pt under the impression he would be going to inpatient rehab. CSW informed pt, per CIR note, pt not does not have medical necessity to support an inpatient rehab admission. Pt stated he was not told this and wants to speak with someone from CIR and the doctor. Pt refused SNF workup until after speaking with CIR. P/T recommending SNF. O/T recommending HHPT.   CSW contacted Genie (CIR) and updated. Genie to speak with pt today in the afternoon. CSW made pt aware.  2:53pm-CSW received a call from [facility] stating CIR will accept pt and admit today, if MD agrees. CSW left VM for MD and called office. MD in surgery today. CSW updated RN. CSW signing off as no further Social Work needs identified. Please reconsult if new Social Work needs arise.   Jose [name], [place], [place] Work [phone]

## 2017-06-19 NOTE — Progress Notes (Signed)
Occupational Therapy Treatment Patient Details Name: Jose [name] MRN: [id number] DOB: 1951-06-29 Today's Date: 06/19/2017    History of present illness Pt is a 66 yo male admitted through ED on 06/15/17 following a fall at home resulting in bilateral quadriceps tendon ruptures. Pt had a partial tear of left quad tendon which was ultimately a weak point as his knee have out and he fell to the floor landing on his knees and rupturing both quadiceps tendons. Pt underwent patella tendon repair on 06/15/17. Pt did have a fall while with imaging. PMH significant for OA, GERD and subdural hematoma.    OT comments  Pt making good progress toward OT goals this session but continues to require close min guard assist for functional mobility and max assist for LB dressing. Continue to feel pt is a high fall risk and would have difficulty managing ADL at home; pt continues to be appropriate for CIR admission. Will continue to follow acutely.   Follow Up Recommendations  CIR (if unable to d/c to CIR will likely need SNF)    Equipment Recommendations  3 in 1 bedside commode;Other (comment) (RW)    Recommendations for Other Services      Precautions / Restrictions Precautions Precautions: Fall;Knee Required Braces or Orthoses: Knee Immobilizer - Right;Knee Immobilizer - Left (bil bledsoe braces) Knee Immobilizer - Right: On at all times Knee Immobilizer - Left: On at all times Restrictions Weight Bearing Restrictions: Yes RLE Weight Bearing: Weight bearing as tolerated LLE Weight Bearing: Weight bearing as tolerated       Mobility Bed Mobility               General bed mobility comments: Pt OOB in chair upon arrival  Transfers Overall transfer level: Needs assistance Equipment used: Rolling walker (2 wheeled) Transfers: Sit to/from Stand Sit to Stand: Min guard         General transfer comment: min guard for sit to stand from chair. Pt with good hand placement and technique with  RW    Balance Overall balance assessment: Needs assistance Sitting-balance support: Feet supported;No upper extremity supported Sitting balance-Leahy Scale: Good     Standing balance support: Bilateral upper extremity supported Standing balance-Leahy Scale: Poor Standing balance comment: RW for support                           ADL either performed or assessed with clinical judgement   ADL Overall ADL's : Needs assistance/impaired                     Lower Body Dressing: Maximal assistance Lower Body Dressing Details (indicate cue type and reason): to don/doff socks Toilet Transfer: Min guard;Ambulation;RW Toilet Transfer Details (indicate cue type and reason): Simulated by sit to stand from chair with functional mobility.         Functional mobility during ADLs: Min guard;Rolling walker       Vision       Perception     Praxis      Cognition Arousal/Alertness: Awake/alert Behavior During Therapy: WFL for tasks assessed/performed Overall Cognitive Status: Within Functional Limits for tasks assessed                                          Exercises     Shoulder Instructions  General Comments      Pertinent Vitals/ Pain       Pain Assessment: Faces Faces Pain Scale: Hurts little more Pain Location: bil LEs Pain Descriptors / Indicators: Sore Pain Intervention(s): Monitored during session;Ice applied  Home Living                                          Prior Functioning/Environment              Frequency  Min 2X/week        Progress Toward Goals  OT Goals(current goals can now be found in the care plan section)  Progress towards OT goals: Progressing toward goals  Acute Rehab OT Goals Patient Stated Goal: rehab before home OT Goal Formulation: With patient/family  Plan Discharge plan needs to be updated    Co-evaluation                 AM-PAC PT "6 Clicks" Daily  Activity     Outcome Measure   Help from another person eating meals?: None Help from another person taking care of personal grooming?: None Help from another person toileting, which includes using toliet, bedpan, or urinal?: A Little Help from another person bathing (including washing, rinsing, drying)?: A Lot Help from another person to put on and taking off regular upper body clothing?: A Little Help from another person to put on and taking off regular lower body clothing?: A Lot 6 Click Score: 18    End of Session Equipment Utilized During Treatment: Rolling walker;Other (comment) (bil bledsoe braces)  OT Visit Diagnosis: Unsteadiness on feet (R26.81)   Activity Tolerance Patient tolerated treatment well   Patient Left in chair;with call bell/phone within reach;with family/visitor present   Nurse Communication          Time: 1202-1219 OT Time Calculation (min): 17 min  Charges: OT General Charges $OT Visit: 1 Procedure OT Treatments $Self Care/Home Management : 8-22 mins  Kunio Cummiskey A. Ulice Brilliant, M.S., OTR/L Pager: [place] 06/19/2017, 12:29 PM

## 2017-06-19 NOTE — H&P (Signed)
Physical Medicine and Rehabilitation Admission H&P       Chief Complaint  Patient presents with  . Bilateral quad tendon rupture.     HPI:  Adeyemi A Coreminis a 66 y.o.malewith history of SDH, OA bilateral knees, history of falls, partial quad rupture on left but functional. He was admitted on 06/15/17 with recurrent fall with pain and inability to extend either knee due to bilateral quad tendon rupture. He underwent quad tendon repair by Dr. Marlou Sa and is to wear B-KI at all times. Therapy evaluations done and patient with deficits in mobility and ability to carry out ADL tasks.  CIR recommended for follow up therapy   Review of Systems  HENT: Positive for hearing loss (mild). Negative for tinnitus.   Eyes: Negative for blurred vision and double vision.  Respiratory: Negative for cough and shortness of breath.   Cardiovascular: Negative for chest pain and palpitations.  Gastrointestinal: Positive for constipation. Negative for heartburn and nausea.  Genitourinary: Negative for dysuria and urgency.  Musculoskeletal: Positive for joint pain (bilateral knees) and myalgias.  Skin: Negative for itching and rash.  Neurological: Positive for headaches (no meds). Negative for dizziness, sensory change, speech change and weakness.  Psychiatric/Behavioral: The patient has insomnia.          Past Medical History:  Diagnosis Date  . Arthritis    R shoulder, great toes, hands- has gout & has injections in knees with Dr. Estanislado Pandy   . Cancer ([place])    skin ca-   . GERD (gastroesophageal reflux disease)   . History of hiatal hernia   . SDH (subdural hematoma) (HCC)     Past Surgical History:  Procedure Laterality Date  . BRAIN SURGERY  2016   evacuation of SDH  . CARPAL TUNNEL RELEASE Bilateral   . GREEN LIGHT LASER TURP (TRANSURETHRAL RESECTION OF PROSTATE  04/27/2017  . QUADRICEPS TENDON REPAIR Bilateral 06/15/2017   Procedure: REPAIR QUADRICEP TENDON;   Surgeon: Meredith Pel, MD;  Location: [facility];  Service: Orthopedics;  Laterality: Bilateral;  . SHOULDER SURGERY Right          Family History  Problem Relation Age of Onset  . Parkinson's disease Mother   . Heart disease Father   . Parkinson's disease Brother   . Diabetes Brother     Social History:  Lives alone. 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 He reports that he has been smoking Cigars. He has never used smokeless tobacco. He reports that he drinks about twice a week--wine and/or liquor. He reports that he does not use drugs.   Allergies: No Known Allergies          Medications Prior to Admission  Medication Sig Dispense Refill  . acetaminophen (TYLENOL) 500 MG tablet Take 500-1,000 mg by mouth every 6 (six) hours as needed for mild pain.    Marland Kitchen allopurinol (ZYLOPRIM) 300 MG tablet TAKE 1 TABLET BY MOUTH EVERY DAY 90 tablet 1  . colchicine 0.6 MG tablet Take 1 tablet (0.6 mg total) by mouth daily as needed (FOR GOUT FLAREUPS). 30 tablet 3  . esomeprazole (NEXIUM) 40 MG capsule Take 1 capsule (40 mg total) by mouth daily. 30 capsule 0  . metoprolol succinate (TOPROL-XL) 25 MG 24 hr tablet Take 1 tablet (25 mg total) by mouth daily. Take with or immediately following a meal. 90 tablet 3  . Multiple Vitamin (MULTIVITAMIN WITH MINERALS) TABS tablet Take 1 tablet by mouth daily.    . cephALEXin (KEFLEX) 500 MG  capsule Take 1 capsule (500 mg total) by mouth 4 (four) times daily. (Patient not taking: Reported on 06/15/2017) 20 capsule 0    Home: Home Living Family/patient expects to be discharged to:: Private residence Living Arrangements: Alone Available Help at Discharge: Friend(s), Available PRN/intermittently Type of Home: Apartment Home Access: Stairs to enter [employer] of Steps: 17 Entrance Stairs-Rails: Right, Left Home Layout: One level Bathroom Shower/Tub: Chiropodist: Standard Bathroom Accessibility:  Yes Home Equipment: None Additional Comments: will have (A) from "friend" never called her girlfriend but did exchange a kiss lip to lip with "friend" . Friend present reports she will be (A)ing up d/c    Functional History: Prior Function Level of Independence: Independent Comments: completely independent, active, driving, still working full time.   Functional Status:  Mobility: Bed Mobility Overal bed mobility: Needs Assistance Bed Mobility: Sit to Supine Supine to sit: Min assist, HOB elevated Sit to supine: Mod assist General bed mobility comments: Pt OOB in chair upon arrival Transfers Overall transfer level: Needs assistance Equipment used: Rolling walker (2 wheeled) Transfers: Sit to/from Stand Sit to Stand: Min guard General transfer comment: min guard for sit to stand from chair. Pt with good hand placement and technique with RW Ambulation/Gait Ambulation/Gait assistance: Min assist Ambulation Distance (Feet): 90 Feet Assistive device: Rolling walker (2 wheeled) Gait Pattern/deviations: Step-to pattern, Decreased stride length, Step-through pattern General Gait Details: gait with bilateral Bledsoe braces and lowered walker to allow improved step length and able to bear weight on walker Gait velocity: Did not attempt this session  ADL: ADL Overall ADL's : Needs assistance/impaired Eating/Feeding: Independent Grooming: Wash/dry hands, Wash/dry face, Oral care, Min guard Upper Body Bathing: Supervision/ safety Lower Body Bathing: Moderate assistance, Sit to/from stand Lower Body Dressing: Maximal assistance Lower Body Dressing Details (indicate cue type and reason): to don/doff socks Toilet Transfer: Min guard, Ambulation, RW Toilet Transfer Details (indicate cue type and reason): Simulated by sit to stand from chair with functional mobility. Functional mobility during ADLs: Min guard, Rolling walker General ADL Comments: pt requires (A) to power up from chair and  toilet. Pt able to progress from toilet with less (A) than chair. pt works for [employer] and already expressed interest in Teacher, English as a foreign language  Cognition: Cognition Overall Cognitive Status: Within Functional Limits for tasks assessed Orientation Level: Oriented X4 Cognition Arousal/Alertness: Awake/alert Behavior During Therapy: WFL for tasks assessed/performed Overall Cognitive Status: Within Functional Limits for tasks assessed   Blood pressure 123/76, pulse 64, temperature 98.6 F (37 C), temperature source Oral, resp. rate 18, height 6' (1.829 m), weight 89.4 kg (197 lb), SpO2 96 %. Physical Exam  Nursing note and vitals reviewed. Constitutional: He is oriented to person, place, and time. He appears well-developed and well-nourished. He appears distressed.  HENT:  Head: Normocephalic and atraumatic.  Eyes: Conjunctivae and EOM are normal. Pupils are equal, round, and reactive to light.  Neck: Normal range of motion. Neck supple.  Cardiovascular: Normal rate and regular rhythm.   Respiratory: Effort normal and breath sounds normal. No stridor.  GI: Soft. Bowel sounds are normal. He exhibits no distension. There is no tenderness.  Musculoskeletal: He exhibits edema (min edema BLE).  Bilateral knees with surgical dressing in place--dry and no drainage noted.  Neurological: He is alert and oriented to person, place, and time.  Skin: Skin is warm and dry. He is not diaphoretic.  Psychiatric: He has a normal mood and affect. His behavior is normal. Judgment and thought content  normal.    Lab Results Last 48 Hours  No results found for this or any previous visit (from the past 48 hour(s)).   Imaging Results (Last 48 hours)  No results found.       Medical Problem List and Plan: 1.  Functional and mobility deficits secondary to bilateral quad tendon ruptures             -admit to inpatient rehab 2.  DVT Prophylaxis/Anticoagulation: Pharmaceutical: Lovenox 3. Pain  Management: will continue hydrocodone prn with robaxin for muscle spams. Encourage use of ice and heat.  4. Mood: LCSW to follow for evaluation and support.  5. Neuropsych: This patient is capable of making decisions on his own behalf. 6. Skin/Wound Care: monitor incisions daily for healing. Maintain adequate nutritional and hydration status.  7. Fluids/Electrolytes/Nutrition: monitor I/O. Check lytes in am 8. Constipation: Augment bowel program---sorbitol today.  9. GERD: on protonix.  10. H/o gout: controlled on allopurinol.  11. Ascending Aortic dilations/ Bicuspid aortic valve: on low dose BB   Post Admission Physician Evaluation: 1. Functional deficits secondary  to bilateral quad tendon ruptures. 2. Patient is admitted to receive collaborative, interdisciplinary care between the physiatrist, rehab nursing staff, and therapy team. 3. Patient's level of medical complexity and substantial therapy needs in context of that medical necessity cannot be provided at a lesser intensity of care such as a SNF. 4. Patient has experienced substantial functional loss from his/her baseline which was documented above under the "Functional History" and "Functional Status" headings.  Judging by the patient's diagnosis, physical exam, and functional history, the patient has potential for functional progress which will result in measurable gains while on inpatient rehab.  These gains will be of substantial and practical use upon discharge  in facilitating mobility and self-care at the household level. 5. Physiatrist will provide 24 hour management of medical needs as well as oversight of the therapy plan/treatment and provide guidance as appropriate regarding the interaction of the two. 6. The Preadmission Screening has been reviewed and patient status is unchanged unless otherwise stated above. 7. 24 hour rehab nursing will assist with bladder management, bowel management, safety, skin/wound care, disease  management, medication administration, pain management and patient education  and help integrate therapy concepts, techniques,education, etc. 8. PT will assess and treat for/with: Lower extremity strength, range of motion, stamina, balance, functional mobility, safety, adaptive techniques and equipment, ortho precautions, pain control.   Goals are: mod I. 9. OT will assess and treat for/with: ADL's, functional mobility, safety, upper extremity strength, adaptive techniques and equipment, pain mgt, ortho precautions.   Goals are: mod I to set up. Therapy may proceed with showering this patient. 10. SLP will assess and treat for/with: n/a.  Goals are: n/a. 11. Case Management and Social Worker will assess and treat for psychological issues and discharge planning. 12. Team conference will be held weekly to assess progress toward goals and to determine barriers to discharge. 13. Patient will receive at least 3 hours of therapy per day at least 5 days per week. 14. ELOS: 6-10 days       15. Prognosis:  excellent     Meredith Staggers, MD, [facility] 06/19/2017  Bary Leriche, Hershal Coria 06/19/2017

## 2017-06-19 NOTE — Care Management Important Message (Signed)
Important Message  Patient Details  Name: Jose [name] MRN: [id number] Date of Birth: June 10, 1951   Medicare Important Message Given:  Yes    Brandyce Dimario 06/19/2017, 11:31 AM

## 2017-06-19 NOTE — PMR Pre-admission (Signed)
PMR Admission Coordinator Pre-Admission Assessment  Patient: Jose [name] is an 66 y.o., male MRN: [id number] DOB: March 16, 1951 Height: 6' (182.9 cm) Weight: 89.4 kg (197 lb)              Insurance Information HMO:     PPO:      PCP:      IPA:      80/20:      OTHER:  PRIMARY: Medicare A & B      Policy#: [id number]      Subscriber: Self CM Name:       Phone#:      Fax#:  Pre-Cert#: eligible via [employer]      Employer: Self employed  Benefits:  Phone #: verified online     Name:  Eff. Date: A:09/17/14 B:02/16/15     Deduct: $1340      Out of Pocket Max: None      Life Max: None CIR: 100%       SNF: 100% days 1-20; 80% days 21-100 Outpatient: 80%      Co-Pay: 20% Home Health: 100%      Co-Pay: None DME: 80%     Co-Pay: 20% Providers: Patient's choice   SECONDARY: BCBS       Policy#: [id number]      Subscriber: Self CM Name:       Phone#:      Fax#:  Pre-Cert#:       Employer: Self employed  Benefits:  Phone #: [phone]     Name:  Eff. Date:      Deduct:       Out of Pocket Max:       Life Max:  CIR:       SNF:  Outpatient:      Co-Pay:  Home Health:       Co-Pay:  DME:      Co-Pay:   Medicaid Application Date:       Case Manager:  Disability Application Date:       Case Worker:   Emergency Facilities manager Information    Name Relation Home Work Mobile   Brittingham,Adam Son [phone]     Sherlene Shams Friend   [phone]     Current Medical History  Patient Admitting Diagnosis:  B/l quad tendon rupture s/p repair  History of Present Illness: Jose [name] a 66 y.o.malewith history of SDH, OA bilateral knees, history of falls, partial quad rupture on left but functional. He was admitted on 06/15/17 with recurrent fall with pain and inability to extend either knee due to bilateral quad tendon rupture. He underwent quad tendon repair by Dr. Marlou Sa and is to wear B-KI at all times. Therapy evaluations done and patient with deficits in mobility and ability  to carry out ADL tasks.  CIR recommended for follow up therapy and patient admitted 06/19/17.      Past Medical History  Past Medical History:  Diagnosis Date  . Arthritis    R shoulder, great toes, hands- has gout & has injections in knees with Dr. Estanislado Pandy   . Cancer ([place])    skin ca-   . GERD (gastroesophageal reflux disease)   . History of hiatal hernia   . SDH (subdural hematoma) (HCC)     Family History  family history includes Diabetes in his brother; Heart disease in his father; Parkinson's disease in his brother and mother.  Prior Rehab/Hospitalizations:  Has the patient had major surgery during 100 days prior to admission?  Yes  Current Medications   Current Facility-Administered Medications:  .  acetaminophen (TYLENOL) tablet 650 mg, 650 mg, Oral, Q6H PRN **OR** acetaminophen (TYLENOL) suppository 650 mg, 650 mg, Rectal, Q6H PRN, Meredith Pel, MD .  allopurinol (ZYLOPRIM) tablet 300 mg, 300 mg, Oral, Daily, Meredith Pel, MD, 300 mg at 06/19/17 0948 .  aspirin tablet 325 mg, 325 mg, Oral, Daily, Meredith Pel, MD, 325 mg at 06/19/17 0948 .  colchicine tablet 0.6 mg, 0.6 mg, Oral, Daily PRN, Meredith Pel, MD .  docusate sodium (COLACE) capsule 100 mg, 100 mg, Oral, BID, Meredith Pel, MD, 100 mg at 06/19/17 0948 .  fentaNYL (SUBLIMAZE) injection 50 mcg, 50 mcg, Intravenous, Once, Oleta Mouse, MD .  HYDROcodone-acetaminophen (NORCO/VICODIN) 5-325 MG per tablet 1-2 tablet, 1-2 tablet, Oral, Q4H PRN, Leandrew Koyanagi, MD, 2 tablet at 06/19/17 1319 .  methocarbamol (ROBAXIN) tablet 500 mg, 500 mg, Oral, Q6H PRN, 500 mg at 06/19/17 0301 **OR** methocarbamol (ROBAXIN) 500 mg in dextrose 5 % 50 mL IVPB, 500 mg, Intravenous, Q6H PRN, Meredith Pel, MD .  metoCLOPramide (REGLAN) tablet 5-10 mg, 5-10 mg, Oral, Q8H PRN **OR** metoCLOPramide (REGLAN) injection 5-10 mg, 5-10 mg, Intravenous, Q8H PRN, Meredith Pel, MD .  metoprolol  succinate (TOPROL-XL) 24 hr tablet 25 mg, 25 mg, Oral, Daily, Meredith Pel, MD, 25 mg at 06/19/17 0948 .  multivitamin with minerals tablet 1 tablet, 1 tablet, Oral, Daily, Meredith Pel, MD, 1 tablet at 06/19/17 [phone] .  ondansetron (ZOFRAN) tablet 4 mg, 4 mg, Oral, Q6H PRN **OR** ondansetron (ZOFRAN) injection 4 mg, 4 mg, Intravenous, Q6H PRN, Meredith Pel, MD .  oxyCODONE (Oxy IR/ROXICODONE) immediate release tablet 5-10 mg, 5-10 mg, Oral, Q3H PRN, Meredith Pel, MD, 10 mg at 06/17/17 1843 .  pantoprazole (PROTONIX) EC tablet 40 mg, 40 mg, Oral, Daily, Meredith Pel, MD, 40 mg at 06/19/17 0948 .  zolpidem (AMBIEN) tablet 5 mg, 5 mg, Oral, QHS PRN, Leandrew Koyanagi, MD, 5 mg at 06/18/17 2330  Patients Current Diet: Diet regular Room service appropriate? Yes; Fluid consistency: Thin  Precautions / Restrictions Precautions Precautions: Fall, Knee Precaution Booklet Issued: No Restrictions Weight Bearing Restrictions: Yes RLE Weight Bearing: Weight bearing as tolerated LLE Weight Bearing: Weight bearing as tolerated Other Position/Activity Restrictions: in knee immobilizers   Has the patient had 2 or more falls or a fall with injury in the past year?Yes the fall that led to this admission   Prior Activity Level Community (5-7x/wk): Prior to admission patient was fully independent.  He shares his time between [place] and [place] and was working as a Optometrist.   Home Assistive Devices / Equipment Home Assistive Devices/Equipment: Eyeglasses Home Equipment: None  Prior Device Use: Indicate devices/aids used by the patient prior to current illness, exacerbation or injury? None of the above  Prior Functional Level Prior Function Level of Independence: Independent Comments: completely independent, active, driving, still working full time.   Self Care: Did the patient need help bathing, dressing, using the toilet or eating? Independent  Indoor Mobility:  Did the patient need assistance with walking from room to room (with or without device)? Independent  Stairs: Did the patient need assistance with internal or external stairs (with or without device)? Independent  Functional Cognition: Did the patient need help planning regular tasks such as shopping or remembering to take medications? Independent  Current Functional Level Cognition  Overall Cognitive Status: Within Functional Limits for tasks  assessed Orientation Level: Oriented X4    Extremity Assessment (includes Sensation/Coordination)  Upper Extremity Assessment: Overall WFL for tasks assessed  Lower Extremity Assessment: Defer to PT evaluation RLE Deficits / Details: post op pain and weakness. At least 3/5 grossly  RLE: Unable to fully assess due to immobilization LLE Deficits / Details: post op pain and weakness. At least 3/5 grossly  LLE: Unable to fully assess due to immobilization    ADLs  Overall ADL's : Needs assistance/impaired Eating/Feeding: Independent Grooming: Wash/dry hands, Wash/dry face, Oral care, Min guard Upper Body Bathing: Supervision/ safety Lower Body Bathing: Moderate assistance, Sit to/from stand Lower Body Dressing: Maximal assistance Lower Body Dressing Details (indicate cue type and reason): to don/doff socks Toilet Transfer: Min guard, Ambulation, RW Toilet Transfer Details (indicate cue type and reason): Simulated by sit to stand from chair with functional mobility. Functional mobility during ADLs: Min guard, Rolling walker General ADL Comments: pt requires (A) to power up from chair and toilet. Pt able to progress from toilet with less (A) than chair. pt works for [employer] and already expressed interest in [place] bed mobility: Needs Assistance Bed Mobility: Sit to Supine Supine to sit: Min assist, HOB elevated Sit to supine: Mod assist General bed mobility comments: Pt OOB in chair upon arrival     Transfers  Overall transfer level: Needs assistance Equipment used: Rolling walker (2 wheeled) Transfers: Sit to/from Stand Sit to Stand: Min assist General transfer comment: increased time to rise from chair with support for balance throughout; utilizes UE's and walking feet back under him technique    Ambulation / Gait / Stairs / Wheelchair Mobility  Ambulation/Gait Ambulation/Gait assistance: Museum/gallery curator (Feet): 140 Feet Assistive device: Rolling walker (2 wheeled) Gait Pattern/deviations: Step-through pattern, Step-to pattern, Decreased stride length General Gait Details: increased time to ambulate in hallway; waddling pattern Gait velocity: Did not attempt this session    Posture / Balance Balance Overall balance assessment: Needs assistance Sitting-balance support: Feet supported, No upper extremity supported Sitting balance-Leahy Scale: Good Standing balance support: Bilateral upper extremity supported Standing balance-Leahy Scale: Poor Standing balance comment: RW for support    Special needs/care consideration BiPAP/CPAP: No CPM: No Continuous Drip IV: No Dialysis: No        Life Vest: No Oxygen: No Special Bed: NO Trach Size: NO Wound Vac (area): No       Skin: bruise on bottom of left foot and bilateral surgical incisions knees                               Bowel mgmt: Continent, last BM 06/14/17 Bladder mgmt: Continent  Diabetic mgmt: No     Previous Home Environment Living Arrangements: Alone Available Help at Discharge: Friend(s), Available PRN/intermittently Type of Home: Apartment Home Layout: One level Home Access: Stairs to enter Entrance Stairs-Rails: Right, Left Entrance Stairs-Number of Steps: 17 Bathroom Shower/Tub: Chiropodist: Standard Bathroom Accessibility: Yes Home Care Services: No Additional Comments: will have (A) from "friend" never called her girlfriend but did exchange a kiss lip to lip with  "friend" . Friend present reports she will be (A)ing up d/c   Discharge Living Setting Plans for Discharge Living Setting: Patient's home, Apartment, Alone Type of Home at Discharge: Apartment Discharge Home Layout: One level Discharge Home Access: Stairs to enter Entrance Stairs-Rails: Right Entrance Stairs-Number of Steps: 17 Discharge Bathroom Shower/Tub:  Tub/shower unit, Curtain Discharge Bathroom Toilet: Standard Discharge Bathroom Accessibility: Yes How Accessible: Accessible via walker Does the patient have any problems obtaining your medications?: No  Social/Family/Support Systems Patient Roles: Other (Comment) (Friend ) Contact Information: Butch Penny [phone]; Sherlene Shams [phone]; Son Quita Skye Johnstone  Anticipated Caregiver: TBD patient aware of recommendations and working on developing a dischrage plan  Anticipated Ambulance person Information: TBD Discharge Plan Discussed with Primary Caregiver: No (Discussed with patient ) Is Caregiver In Agreement with Plan?:  (Patient in agreement with plan ) Does Caregiver/Family have Issues with Lodging/Transportation while Pt is in Rehab?: No  Goals/Additional Needs Patient/Family Goal for Rehab: PT/OT Mod I-Supervision  Expected length of stay: 6-9 days Cultural Considerations: None Dietary Needs: Regular textuers and thin liquids Equipment Needs: TBD Special Service Needs: Patient could benefit from more education to assist with discharge planning  Additional Information: None Pt/Family Agrees to Admission and willing to participate: Yes Program Orientation Provided & Reviewed with Pt/Caregiver Including Roles  & Responsibilities: Yes Additional Information Needs: Team to assist with and encourage discharge planning for next level of care  Information Needs to be Provided By: Team   Decrease burden of Care through IP rehab admission: No  Possible need for SNF placement upon discharge: No  Patient Condition: This  patient's medical and functional status has changed since the consult dated 06/18/17 in which the Rehabilitation Physician determined and documented that the patient was potentially appropriate for intensive rehabilitative care in an inpatient rehabilitation facility. Issues have been addressed and update has been discussed with Dr. Naaman Plummer and patient now appropriate for inpatient rehabilitation. Will admit to inpatient rehab today.   Preadmission Screen Completed By:  Gunnar Fusi, 06/19/2017 3:20 PM ______________________________________________________________________   Discussed status with Dr. Naaman Plummer on 06/19/17 at 1525 and received telephone approval for admission today.  Admission Coordinator:  Gunnar Fusi, time 1525/Date 06/19/17

## 2017-06-19 NOTE — Progress Notes (Addendum)
Inpatient Rehabilitation  I have assumed this case from my co-worker. I have called Dr.Dean's office and left a message with staff requesting medical clearance to admit patient to IP Rehab today.  I await a response and plan to update the team as I know.  Please call with questions.  Addendum: O have received acute medical clearance and will proceed with admitting patient to IP Rehab today.    Carmelia Roller., CCC/SLP Admission Coordinator  [place]  Cell [phone]

## 2017-06-19 NOTE — Progress Notes (Signed)
Physical Therapy Treatment Patient Details Name: CHER EGNOR MRN: [id number] DOB: 07-21-1951 Today's Date: 06/19/2017    History of Present Illness Pt is a 66 yo male admitted through ED on 06/15/17 following a fall at home resulting in bilateral quadriceps tendon ruptures. Pt had a partial tear of left quad tendon which was ultimately a weak point as his knee have out and he fell to the floor landing on his knees and rupturing both quadiceps tendons. Pt underwent patella tendon repair on 06/15/17. Pt did have a fall while with imaging. PMH significant for OA, GERD and subdural hematoma.     PT Comments    Patient progressing with ambulation distance and decreased assist for sit to stand transfers though posterior bias throughout and high fall risk.  Feel he will benefit from continued skilled PT in the acute setting as well as CIR level rehab at d/c if approved.     Follow Up Recommendations  CIR     Equipment Recommendations  Rolling walker with 5" wheels    Recommendations for Other Services Rehab consult     Precautions / Restrictions Precautions Precautions: Fall;Knee Required Braces or Orthoses: Knee Immobilizer - Right;Knee Immobilizer - Left (bil Bledsoe braces locked in extension) Knee Immobilizer - Right: On at all times Knee Immobilizer - Left: On at all times Restrictions Weight Bearing Restrictions: Yes RLE Weight Bearing: Weight bearing as tolerated LLE Weight Bearing: Weight bearing as tolerated    Mobility  Bed Mobility               General bed mobility comments: Pt OOB in chair upon arrival  Transfers Overall transfer level: Needs assistance Equipment used: Rolling walker (2 wheeled) Transfers: Sit to/from Stand Sit to Stand: Min assist         General transfer comment: increased time to rise from chair with support for balance throughout; utilizes UE's and walking feet back under him technique  Ambulation/Gait Ambulation/Gait assistance: Min  assist Ambulation Distance (Feet): 140 Feet Assistive device: Rolling walker (2 wheeled) Gait Pattern/deviations: Step-through pattern;Step-to pattern;Decreased stride length     General Gait Details: increased time to ambulate in hallway; waddling pattern   Stairs            Wheelchair Mobility    Modified Rankin (Stroke Patients Only)       Balance Overall balance assessment: Needs assistance Sitting-balance support: Feet supported;No upper extremity supported Sitting balance-Leahy Scale: Good     Standing balance support: Bilateral upper extremity supported Standing balance-Leahy Scale: Poor Standing balance comment: RW for support                            Cognition Arousal/Alertness: Awake/alert Behavior During Therapy: WFL for tasks assessed/performed Overall Cognitive Status: Within Functional Limits for tasks assessed                                        Exercises      General Comments General comments (skin integrity, edema, etc.): braces partially doffed with ice on thighs upon my entry; friend in room assisting with foot hygiene      Pertinent Vitals/Pain Pain Assessment: Faces Pain Score: 3  Faces Pain Scale: Hurts little more Pain Location: bil LEs Pain Descriptors / Indicators: Sore Pain Intervention(s): Monitored during session    Home Living  Prior Function Level of Independence: Independent          PT Goals (current goals can now be found in the care plan section) Acute Rehab PT Goals Patient Stated Goal: rehab before home Progress towards PT goals: Progressing toward goals    Frequency    Min 5X/week      PT Plan Discharge plan needs to be updated    Co-evaluation              AM-PAC PT "6 Clicks" Daily Activity  Outcome Measure  Difficulty turning over in bed (including adjusting bedclothes, sheets and blankets)?: A Lot Difficulty moving from lying on  back to sitting on the side of the bed? : Total Difficulty sitting down on and standing up from a chair with arms (e.g., wheelchair, bedside commode, etc,.)?: Total Help needed moving to and from a bed to chair (including a wheelchair)?: A Little Help needed walking in hospital room?: A Little Help needed climbing 3-5 steps with a railing? : Total 6 Click Score: 11    End of Session Equipment Utilized During Treatment: Right knee immobilizer;Left knee immobilizer;Gait belt Activity Tolerance: Patient tolerated treatment well Patient left: in chair;with call bell/phone within reach;with family/visitor present   PT Visit Diagnosis: Unsteadiness on feet (R26.81);Difficulty in walking, not elsewhere classified (R26.2);Pain;Muscle weakness (generalized) (M62.81) Pain - Right/Left: Right Pain - part of body: Knee     Time: 9166-0600 PT Time Calculation (min) (ACUTE ONLY): 29 min  Charges:  $Gait Training: 8-22 mins $Therapeutic Activity: 8-22 mins                    G CodesMagda Kiel, [place] [phone] 06/19/2017    Reginia Naas 06/19/2017, 3:20 PM

## 2017-06-19 NOTE — Progress Notes (Signed)
Gunnar Fusi Rehab Admission Coordinator Signed Physical Medicine and Rehabilitation  PMR Pre-admission Date of Service: 06/19/2017 3:12 PM  Related encounter: ED to Hosp-Admission (Current) from 06/15/2017 in [facility]       [] Hide copied text PMR Admission Coordinator Pre-Admission Assessment  Patient: Jose [name] is an 66 y.o., male MRN: [id number] DOB: November 28, 1951 Height: 6' (182.9 cm) Weight: 89.4 kg (197 lb)                                                                                                                                                  Insurance Information HMO:     PPO:      PCP:      IPA:      80/20:      OTHER:  PRIMARY: Medicare A & B      Policy#: [id number]      Subscriber: Self CM Name:       Phone#:      Fax#:  Pre-Cert#: eligible via Passport [employer]      Employer: Self employed  Benefits:  Phone #: verified online     Name:  Eff. Date: A:09/17/14 B:02/16/15     Deduct: $1340      Out of Pocket Max: None      Life Max: None CIR: 100%       SNF: 100% days 1-20; 80% days 21-100 Outpatient: 80%      Co-Pay: 20% Home Health: 100%      Co-Pay: None DME: 80%     Co-Pay: 20% Providers: Patient's choice   SECONDARY: BCBS       Policy#: [id number]      Subscriber: Self CM Name:       Phone#:      Fax#:  Pre-Cert#:       Employer: Self employed  Benefits:  Phone #: [phone]     Name:  Eff. Date:      Deduct:       Out of Pocket Max:       Life Max:  CIR:       SNF:  Outpatient:      Co-Pay:  Home Health:       Co-Pay:  DME:      Co-Pay:   Medicaid Application Date:       Case Manager:  Disability Application Date:       Case Worker:   Emergency Tax adviser Information    Name Relation Home Work Mobile   Starzyk,Adam Son [phone]     Sherlene Shams Friend   [phone]     Current Medical History  Patient Admitting Diagnosis: B/l quad tendon rupture s/p repair  History  of Present Illness: Peace A Coreminis a 66 y.o.malewith history of SDH, OA bilateral knees, history  offalls, partial quad rupture on left but functional. He was admitted on 06/15/17 with recurrent fall with pain and inability to extend either knee due to bilateral quad tendon rupture. He underwent quad tendon repair by Dr. Marlou Sa and is to wear B-KI at all times. Therapy evaluations done and patient with deficits in mobility and ability to carry out ADL tasks. CIR recommended for follow up therapy and patient admitted 06/19/17.  Past Medical History      Past Medical History:  Diagnosis Date  . Arthritis    R shoulder, great toes, hands- has gout & has injections in knees with Dr. Estanislado Pandy   . Cancer ([place])    skin ca-   . GERD (gastroesophageal reflux disease)   . History of hiatal hernia   . SDH (subdural hematoma) (HCC)     Family History  family history includes Diabetes in his brother; Heart disease in his father; Parkinson's disease in his brother and mother.  Prior Rehab/Hospitalizations:  Has the patient had major surgery during 100 days prior to admission? Yes  Current Medications   Current Facility-Administered Medications:  .  acetaminophen (TYLENOL) tablet 650 mg, 650 mg, Oral, Q6H PRN **OR** acetaminophen (TYLENOL) suppository 650 mg, 650 mg, Rectal, Q6H PRN, Meredith Pel, MD .  allopurinol (ZYLOPRIM) tablet 300 mg, 300 mg, Oral, Daily, Meredith Pel, MD, 300 mg at 06/19/17 0948 .  aspirin tablet 325 mg, 325 mg, Oral, Daily, Meredith Pel, MD, 325 mg at 06/19/17 0948 .  colchicine tablet 0.6 mg, 0.6 mg, Oral, Daily PRN, Meredith Pel, MD .  docusate sodium (COLACE) capsule 100 mg, 100 mg, Oral, BID, Meredith Pel, MD, 100 mg at 06/19/17 0948 .  fentaNYL (SUBLIMAZE) injection 50 mcg, 50 mcg, Intravenous, Once, Oleta Mouse, MD .  HYDROcodone-acetaminophen (NORCO/VICODIN) 5-325 MG per tablet 1-2 tablet, 1-2 tablet, Oral, Q4H  PRN, Leandrew Koyanagi, MD, 2 tablet at 06/19/17 1319 .  methocarbamol (ROBAXIN) tablet 500 mg, 500 mg, Oral, Q6H PRN, 500 mg at 06/19/17 0301 **OR** methocarbamol (ROBAXIN) 500 mg in dextrose 5 % 50 mL IVPB, 500 mg, Intravenous, Q6H PRN, Meredith Pel, MD .  metoCLOPramide (REGLAN) tablet 5-10 mg, 5-10 mg, Oral, Q8H PRN **OR** metoCLOPramide (REGLAN) injection 5-10 mg, 5-10 mg, Intravenous, Q8H PRN, Meredith Pel, MD .  metoprolol succinate (TOPROL-XL) 24 hr tablet 25 mg, 25 mg, Oral, Daily, Meredith Pel, MD, 25 mg at 06/19/17 0948 .  multivitamin with minerals tablet 1 tablet, 1 tablet, Oral, Daily, Meredith Pel, MD, 1 tablet at 06/19/17 [phone] .  ondansetron (ZOFRAN) tablet 4 mg, 4 mg, Oral, Q6H PRN **OR** ondansetron (ZOFRAN) injection 4 mg, 4 mg, Intravenous, Q6H PRN, Meredith Pel, MD .  oxyCODONE (Oxy IR/ROXICODONE) immediate release tablet 5-10 mg, 5-10 mg, Oral, Q3H PRN, Meredith Pel, MD, 10 mg at 06/17/17 1843 .  pantoprazole (PROTONIX) EC tablet 40 mg, 40 mg, Oral, Daily, Meredith Pel, MD, 40 mg at 06/19/17 0948 .  zolpidem (AMBIEN) tablet 5 mg, 5 mg, Oral, QHS PRN, Leandrew Koyanagi, MD, 5 mg at 06/18/17 2330  Patients Current Diet: Diet regular Room service appropriate? Yes; Fluid consistency: Thin  Precautions / Restrictions Precautions Precautions: Fall, Knee Precaution Booklet Issued: No Restrictions Weight Bearing Restrictions: Yes RLE Weight Bearing: Weight bearing as tolerated LLE Weight Bearing: Weight bearing as tolerated Other Position/Activity Restrictions: in knee immobilizers   Has the patient had 2 or more falls or a fall with injury in  the past year?Yes the fall that led to this admission   Prior Activity Level Community (5-7x/wk): Prior to admission patient was fully independent.  He shares his time between [place] and [place] and was working as a Optometrist.   Home Assistive Devices / Equipment Home Assistive  Devices/Equipment: Eyeglasses Home Equipment: None  Prior Device Use: Indicate devices/aids used by the patient prior to current illness, exacerbation or injury? None of the above  Prior Functional Level Prior Function Level of Independence: Independent Comments: completely independent, active, driving, still working full time.   Self Care: Did the patient need help bathing, dressing, using the toilet or eating? Independent  Indoor Mobility: Did the patient need assistance with walking from room to room (with or without device)? Independent  Stairs: Did the patient need assistance with internal or external stairs (with or without device)? Independent  Functional Cognition: Did the patient need help planning regular tasks such as shopping or remembering to take medications? Independent  Current Functional Level Cognition  Overall Cognitive Status: Within Functional Limits for tasks assessed Orientation Level: Oriented X4    Extremity Assessment (includes Sensation/Coordination)  Upper Extremity Assessment: Overall WFL for tasks assessed  Lower Extremity Assessment: Defer to PT evaluation RLE Deficits / Details: post op pain and weakness. At least 3/5 grossly  RLE: Unable to fully assess due to immobilization LLE Deficits / Details: post op pain and weakness. At least 3/5 grossly  LLE: Unable to fully assess due to immobilization    ADLs  Overall ADL's : Needs assistance/impaired Eating/Feeding: Independent Grooming: Wash/dry hands, Wash/dry face, Oral care, Min guard Upper Body Bathing: Supervision/ safety Lower Body Bathing: Moderate assistance, Sit to/from stand Lower Body Dressing: Maximal assistance Lower Body Dressing Details (indicate cue type and reason): to don/doff socks Toilet Transfer: Min guard, Ambulation, RW Toilet Transfer Details (indicate cue type and reason): Simulated by sit to stand from chair with functional mobility. Functional mobility during  ADLs: Min guard, Rolling walker General ADL Comments: pt requires (A) to power up from chair and toilet. Pt able to progress from toilet with less (A) than chair. pt works for [employer] and already expressed interest in [place] bed mobility: Needs Assistance Bed Mobility: Sit to Supine Supine to sit: Min assist, HOB elevated Sit to supine: Mod assist General bed mobility comments: Pt OOB in chair upon arrival    Transfers  Overall transfer level: Needs assistance Equipment used: Rolling walker (2 wheeled) Transfers: Sit to/from Stand Sit to Stand: Min assist General transfer comment: increased time to rise from chair with support for balance throughout; utilizes UE's and walking feet back under him technique    Ambulation / Gait / Stairs / Wheelchair Mobility  Ambulation/Gait Ambulation/Gait assistance: Museum/gallery curator (Feet): 140 Feet Assistive device: Rolling walker (2 wheeled) Gait Pattern/deviations: Step-through pattern, Step-to pattern, Decreased stride length General Gait Details: increased time to ambulate in hallway; waddling pattern Gait velocity: Did not attempt this session    Posture / Balance Balance Overall balance assessment: Needs assistance Sitting-balance support: Feet supported, No upper extremity supported Sitting balance-Leahy Scale: Good Standing balance support: Bilateral upper extremity supported Standing balance-Leahy Scale: Poor Standing balance comment: RW for support    Special needs/care consideration BiPAP/CPAP: No CPM: No Continuous Drip IV: No Dialysis: No        Life Vest: No Oxygen: No Special Bed: NO Trach Size: NO Wound Vac (area): No       Skin:  bruise on bottom of left foot and bilateral surgical incisions knees                               Bowel mgmt: Continent, last BM 06/14/17 Bladder mgmt: Continent  Diabetic mgmt: No     Previous Home Environment Living  Arrangements: Alone Available Help at Discharge: Friend(s), Available PRN/intermittently Type of Home: Apartment Home Layout: One level Home Access: Stairs to enter Entrance Stairs-Rails: Right, Left Entrance Stairs-Number of Steps: 17 Bathroom Shower/Tub: Optometrist: Yes Home Care Services: No Additional Comments: will have (A) from "friend" never called her girlfriend but did exchange a kiss lip to lip with "friend" . Friend present reports she will be (A)ing up d/c   Discharge Living Setting Plans for Discharge Living Setting: Patient's home, Apartment, Alone Type of Home at Discharge: Apartment Discharge Home Layout: One level Discharge Home Access: Stairs to enter Entrance Stairs-Rails: Right Entrance Stairs-Number of Steps: 17 Discharge Bathroom Shower/Tub: Tub/shower unit, Curtain Discharge Bathroom Toilet: Standard Discharge Bathroom Accessibility: Yes How Accessible: Accessible via walker Does the patient have any problems obtaining your medications?: No  Social/Family/Support Systems Patient Roles: Other (Comment) (Friend ) Contact Information: Butch Penny [phone]; Sherlene Shams [phone]; Son Quita Skye Posa  Anticipated Caregiver: TBD patient aware of recommendations and working on developing a dischrage plan  Anticipated Ambulance person Information: TBD Discharge Plan Discussed with Primary Caregiver: No (Discussed with patient ) Is Caregiver In Agreement with Plan?:  (Patient in agreement with plan ) Does Caregiver/Family have Issues with Lodging/Transportation while Pt is in Rehab?: No  Goals/Additional Needs Patient/Family Goal for Rehab: PT/OT Mod I-Supervision  Expected length of stay: 6-9 days Cultural Considerations: None Dietary Needs: Regular textuers and thin liquids Equipment Needs: TBD Special Service Needs: Patient could benefit from more education to assist with discharge planning    Additional Information: None Pt/Family Agrees to Admission and willing to participate: Yes Program Orientation Provided & Reviewed with Pt/Caregiver Including Roles  & Responsibilities: Yes Additional Information Needs: Team to assist with and encourage discharge planning for next level of care  Information Needs to be Provided By: Team   Decrease burden of Care through IP rehab admission: No  Possible need for SNF placement upon discharge: No  Patient Condition: This patient's medical and functional status has changed since the consult dated 06/18/17 in which the Rehabilitation Physician determined and documented that the patient was potentially appropriate for intensive rehabilitative care in an inpatient rehabilitation facility. Issues have been addressed and update has been discussed with Dr. Naaman Plummer and patient now appropriate for inpatient rehabilitation. Will admit to inpatient rehab today.   Preadmission Screen Completed By:  Gunnar Fusi, 06/19/2017 3:20 PM ______________________________________________________________________   Discussed status with Dr. Naaman Plummer on 06/19/17 at 1525 and received telephone approval for admission today.  Admission Coordinator:  Gunnar Fusi, time 1525/Date 06/19/17       Cosigned by: Meredith Staggers, MD at 06/19/2017 4:07 PM  Revision History

## 2017-06-19 NOTE — Progress Notes (Signed)
Rehab admissions - I have reached out to our medical director and I now have approval from rehab MD to admit patient to acute inpatient rehab.  I do have a bed open today and can admit today if attending MD agrees.  Call me for questions.  #[phone]

## 2017-06-19 NOTE — Progress Notes (Signed)
Subjective: Patient stable.  Moving feet well.  Was walking and also today   Objective: Vital signs in last 24 hours: Temp:  [98.6 F (37 C)-98.7 F (37.1 C)] 98.7 F (37.1 C) (07/03 1424) Pulse Rate:  [64-76] 65 (07/03 1424) Resp:  [18] 18 (07/03 0517) BP: (123-137)/(76-91) 132/76 (07/03 1424) SpO2:  [96 %-99 %] 99 % (07/03 1424)  Intake/Output from previous day: 07/02 0701 - 07/03 0700 In: 1020 [P.O.:1020] Out: 1695 [Urine:1695] Intake/Output this shift: Total I/O In: 240 [P.O.:240] Out: 100 [Urine:100]  Exam:  Dorsiflexion/Plantar flexion intact  Labs: No results for input(s): HGB in the last 72 hours. No results for input(s): WBC, RBC, HCT, PLT in the last 72 hours. No results for input(s): NA, K, CL, CO2, BUN, CREATININE, GLUCOSE, CALCIUM in the last 72 hours. No results for input(s): LABPT, INR in the last 72 hours.  Assessment/Plan: Plan admission to inpatient rehabilitation today   Trinton Prewitt 06/19/2017, 3:44 PM

## 2017-06-19 NOTE — Care Management Note (Signed)
Case Management Note  Patient Details  Name: Jose [name] MRN: [id number] Date of Birth: November 26, 1951  Subjective/Objective:                    Action/Plan: Pt discharging to CIR today. No further needs per CM.  Expected Discharge Date:  06/19/17               Expected Discharge Plan:  [place]  In-House Referral:     Discharge planning Services     Post Acute Care Choice:    Choice offered to:     DME Arranged:    DME Agency:     HH Arranged:    HH Agency:     Status of Service:  Completed, signed off  If discussed at [employer], dates discussed:    Additional Comments:  Pollie Friar, RN 06/19/2017, 5:05 PM

## 2017-06-19 NOTE — Progress Notes (Signed)
Discharged from 6N. Transferring to 4MW-acute inpatient rehab. Patient has ambulated twice with PT and once with nursing assistant. Tolerated well using rolling walker. Medicated for pain once and given a muscle relaxant (see emar) at time of transfer. Left unit via bed, going to room 4Mw05

## 2017-06-19 NOTE — Progress Notes (Signed)
Pt arrived in room around 1700. He is settled in comfortably  With no pain at the moment. He was clean and dry with no complaints

## 2017-06-20 ENCOUNTER — Inpatient Hospital Stay (HOSPITAL_COMMUNITY): Payer: Self-pay | Admitting: Occupational Therapy

## 2017-06-20 ENCOUNTER — Inpatient Hospital Stay (HOSPITAL_COMMUNITY): Payer: Medicare Other | Admitting: Physical Therapy

## 2017-06-20 DIAGNOSIS — Z8639 Personal history of other endocrine, nutritional and metabolic disease: Secondary | ICD-10-CM

## 2017-06-20 DIAGNOSIS — K5900 Constipation, unspecified: Secondary | ICD-10-CM

## 2017-06-20 DIAGNOSIS — E8809 Other disorders of plasma-protein metabolism, not elsewhere classified: Secondary | ICD-10-CM

## 2017-06-20 DIAGNOSIS — E46 Unspecified protein-calorie malnutrition: Secondary | ICD-10-CM

## 2017-06-20 DIAGNOSIS — Z8739 Personal history of other diseases of the musculoskeletal system and connective tissue: Secondary | ICD-10-CM

## 2017-06-20 DIAGNOSIS — G479 Sleep disorder, unspecified: Secondary | ICD-10-CM

## 2017-06-20 DIAGNOSIS — Z862 Personal history of diseases of the blood and blood-forming organs and certain disorders involving the immune mechanism: Secondary | ICD-10-CM

## 2017-06-20 LAB — CBC WITH DIFFERENTIAL/PLATELET
Basophils Absolute: 0.1 10*3/uL (ref 0.0–0.1)
Basophils Relative: 1 %
EOS PCT: 2 %
Eosinophils Absolute: 0.2 10*3/uL (ref 0.0–0.7)
HEMATOCRIT: 37.4 % — AB (ref 39.0–52.0)
HEMOGLOBIN: 12.4 g/dL — AB (ref 13.0–17.0)
LYMPHS ABS: 1.3 10*3/uL (ref 0.7–4.0)
LYMPHS PCT: 17 %
MCH: 31.4 pg (ref 26.0–34.0)
MCHC: 33.2 g/dL (ref 30.0–36.0)
MCV: 94.7 fL (ref 78.0–100.0)
Monocytes Absolute: 1.1 10*3/uL — ABNORMAL HIGH (ref 0.1–1.0)
Monocytes Relative: 14 %
NEUTROS ABS: 5.2 10*3/uL (ref 1.7–7.7)
Neutrophils Relative %: 66 %
PLATELETS: 231 10*3/uL (ref 150–400)
RBC: 3.95 MIL/uL — AB (ref 4.22–5.81)
RDW: 13.4 % (ref 11.5–15.5)
WBC: 7.8 10*3/uL (ref 4.0–10.5)

## 2017-06-20 LAB — COMPREHENSIVE METABOLIC PANEL
ALK PHOS: 48 U/L (ref 38–126)
ALT: 23 U/L (ref 17–63)
AST: 24 U/L (ref 15–41)
Albumin: 2.6 g/dL — ABNORMAL LOW (ref 3.5–5.0)
Anion gap: 8 (ref 5–15)
BILIRUBIN TOTAL: 0.6 mg/dL (ref 0.3–1.2)
BUN: 12 mg/dL (ref 6–20)
CALCIUM: 8.7 mg/dL — AB (ref 8.9–10.3)
CO2: 28 mmol/L (ref 22–32)
CREATININE: 1 mg/dL (ref 0.61–1.24)
Chloride: 104 mmol/L (ref 101–111)
Glucose, Bld: 108 mg/dL — ABNORMAL HIGH (ref 65–99)
Potassium: 3.8 mmol/L (ref 3.5–5.1)
Sodium: 140 mmol/L (ref 135–145)
TOTAL PROTEIN: 5.7 g/dL — AB (ref 6.5–8.1)

## 2017-06-20 MED ORDER — DIPHENHYDRAMINE HCL 25 MG PO CAPS
25.0000 mg | ORAL_CAPSULE | Freq: Four times a day (QID) | ORAL | Status: DC | PRN
  Administered 2017-06-20 – 2017-06-25 (×5): 25 mg via ORAL
  Filled 2017-06-20 (×5): qty 1

## 2017-06-20 MED ORDER — PRO-STAT SUGAR FREE PO LIQD
30.0000 mL | Freq: Two times a day (BID) | ORAL | Status: DC
  Administered 2017-06-20 – 2017-06-27 (×15): 30 mL via ORAL
  Filled 2017-06-20 (×15): qty 30

## 2017-06-20 MED ORDER — ALPRAZOLAM 0.25 MG PO TABS
0.5000 mg | ORAL_TABLET | Freq: Every evening | ORAL | Status: DC | PRN
Start: 2017-06-20 — End: 2017-06-27
  Administered 2017-06-20 – 2017-06-26 (×7): 0.5 mg via ORAL
  Filled 2017-06-20 (×7): qty 2

## 2017-06-20 NOTE — Plan of Care (Signed)
Problem: RH Balance Goal: LTG Patient will maintain dynamic standing balance (PT) LTG:  Patient will maintain dynamic standing balance with assistance during mobility activities (PT) With LRAD  Problem: RH Bed to Chair Transfers Goal: LTG Patient will perform bed/chair transfers w/assist (PT) LTG: Patient will perform bed/chair transfers with assistance, with/without cues (PT). With LRAD  Problem: RH Car Transfers Goal: LTG Patient will perform car transfers with assist (PT) LTG: Patient will perform car transfers with assistance (PT). With LRAD  Problem: RH Furniture Transfers Goal: LTG Patient will perform furniture transfers w/assist (OT/PT LTG: Patient will perform furniture transfers  with assistance (OT/PT). With LRAD  Problem: RH Ambulation Goal: LTG Patient will ambulate in controlled environment (PT) LTG: Patient will ambulate in a controlled environment, # of feet with assistance (PT). 150 ft with LRAD Goal: LTG Patient will ambulate in home environment (PT) LTG: Patient will ambulate in home environment, # of feet with assistance (PT). 19 ft with LRAD  Problem: RH Wheelchair Mobility Goal: LTG Patient will propel w/c in community environment (PT) LTG: Patient will propel wheelchair in community environment, # of feet with assist (PT) 150 ft   Problem: RH Stairs Goal: LTG Patient will ambulate up and down stairs w/assist (PT) LTG: Patient will ambulate up and down # of stairs with assistance (PT) 17 steps with R rail for apartment access

## 2017-06-20 NOTE — Patient Care Conference (Signed)
Inpatient RehabilitationTeam Conference and Plan of Care Update Date: 06/20/2017   Time: 10:25 AM    Patient Name: Jose [name]      Medical Record Number: [id number]  Date of Birth: [birth year] Sex: Male         Room/Bed: 4M05C/4M05C-01 Payor Info: Payor: MEDICARE / Plan: MEDICARE PART A AND B / Product Type: *No Product type* /    Admitting Diagnosis: B Quad Tendon Ruptures  Admit Date/Time:  06/19/2017  5:09 PM Admission Comments: No comment available   Primary Diagnosis:  Quadriceps tendon rupture, left, sequela Principal Problem: Quadriceps tendon rupture, left, sequela  Patient Active Problem List   Diagnosis Date Noted  . Abnormality of gait   . Hypoalbuminemia due to protein-calorie malnutrition ([place])   . History of gout   . Sleep disturbance   . Constipation   . Rupture of quadriceps tendon, right, sequela 06/19/2017  . Quadriceps tendon rupture, left, sequela 06/19/2017  . Acute blood loss anemia 06/19/2017  . Fall   . History of subdural hematoma   . Post-operative pain   . Recurrent falls   . Quadriceps tendon rupture 06/15/2017  . Primary osteoarthritis of both knees 02/28/2017  . Primary osteoarthritis of both hands 02/28/2017  . Uricacidemia 02/28/2017  . Pain in joint of left knee 02/07/2017  . Idiopathic chronic gout, unspecified site, without tophus (tophi) 11/29/2016  . HEMATURIA UNSPECIFIED 01/25/2009  . ABDOMINAL PAIN 01/25/2009  . XERODERMA 03/10/2008  . UNS ADVRS EFF UNS RX MEDICINAL&BIOLOGICAL SBSTNC 03/10/2008  . ADJUSTMENT REACTION WITH PHYSICAL SYMPTOMS 02/14/2008  . MUSCLE SPASM 02/14/2008  . ACUTE PROSTATITIS 12/16/2007  . BREAST LUMP OR MASS, RIGHT 07/12/2007  . H/O: RCT (rotator cuff tear) 01/13/2007  . GOUT 01/08/2007    Expected Discharge Date:    Team Members Present: Physician leading conference: Dr. Delice Lesch Social Worker Present: Ovidio Kin, LCSW Nurse Present: Other (comment) Alda Lea) PT Present: Kem Parkinson, PT OT Present: Clyda Greener, OT SLP Present: Windell Moulding, SLP PPS Coordinator present : Daiva Nakayama, RN, CRRN     Current Status/Progress Goal Weekly Team Focus  Medical   Functional and mobility deficits secondary to bilateral quad tendon ruptures  Improve mobility, transfers, ABLA, constipation, sleep  See above   Bowel/Bladder   continenet of bowel & bladder, LBM 06/12/17, reported to have refused miralax on other floor, given miralx & sorbital tonight  remain continent, regain a pattern for BMs  continue to monitor   Swallow/Nutrition/ Hydration             ADL's     eval pending        Mobility   supervision for ambulation with RW up to 122 ft, steady assist transfers, supervision w/c mobility  Mod I overall, supervision stair negotiation & car transfer  pt education, transfers, gait, stair negotiation, endurance, w/c mobility   Communication             Safety/Cognition/ Behavioral Observations            Pain   c/o pain tonight 6/10 to the left knee, has tylenol, tramadol, robaxin & norco prn  pain scale <4  continue to assess & treat as needed   Skin   mepilex drsgs to bil knees incisions, no drainage noted, edema to both  no signs of infection, no new areas of skin break down  assess q shift      *See Care Plan and progress notes for long and short-term goals.  Barriers to Discharge: Mobility, transfers, ABLA, constipation, sleep    Possible Resolutions to Barriers:  Therapies, follow labs, optimize bowel reg    Discharge Planning/Teaching Needs:    HOme with his friend assisting him. He realizes he will need some help at discharge from rehab.     Team Discussion:  New eval-setting goals for home. Pt is having pain issues which MD is aware of and working on pain management.  Revisions to Treatment Plan:  New eval   Continued Need for Acute Rehabilitation Level of Care: The patient requires daily medical management by a physician with specialized  training in physical medicine and rehabilitation for the following conditions: Daily direction of a multidisciplinary physical rehabilitation program to ensure safe treatment while eliciting the highest outcome that is of practical value to the patient.: Yes Daily medical management of patient stability for increased activity during participation in an intensive rehabilitation regime.: Yes Daily analysis of laboratory values and/or radiology reports with any subsequent need for medication adjustment of medical intervention for : Post surgical problems;Wound care problems;Other  [name] 06/21/2017, 8:41 AM

## 2017-06-20 NOTE — Evaluation (Signed)
Physical Therapy Assessment and Plan  Patient Details  Name: LASON EVELAND MRN: [id number] Date of Birth: January 28, 1951  PT Diagnosis: Abnormality of gait, Difficulty walking, Impaired sensation, Muscle weakness and Pain in B knees Rehab Potential: Excellent ELOS: 7-10 days   Today's Date: 06/20/2017 PT Individual Time: 1006-1106 PT Individual Time Calculation (min): 60 min    Problem List:  Patient Active Problem List   Diagnosis Date Noted  . Hypoalbuminemia due to protein-calorie malnutrition ([place])   . History of gout   . Sleep disturbance   . Constipation   . Rupture of quadriceps tendon, right, sequela 06/19/2017  . Quadriceps tendon rupture, left, sequela 06/19/2017  . Acute blood loss anemia 06/19/2017  . Fall   . History of subdural hematoma   . Post-operative pain   . Recurrent falls   . Quadriceps tendon rupture 06/15/2017  . Primary osteoarthritis of both knees 02/28/2017  . Primary osteoarthritis of both hands 02/28/2017  . Uricacidemia 02/28/2017  . Pain in joint of left knee 02/07/2017  . Idiopathic chronic gout, unspecified site, without tophus (tophi) 11/29/2016  . HEMATURIA UNSPECIFIED 01/25/2009  . ABDOMINAL PAIN 01/25/2009  . XERODERMA 03/10/2008  . UNS ADVRS EFF UNS RX MEDICINAL&BIOLOGICAL SBSTNC 03/10/2008  . ADJUSTMENT REACTION WITH PHYSICAL SYMPTOMS 02/14/2008  . MUSCLE SPASM 02/14/2008  . ACUTE PROSTATITIS 12/16/2007  . BREAST LUMP OR MASS, RIGHT 07/12/2007  . H/O: RCT (rotator cuff tear) 01/13/2007  . GOUT 01/08/2007    Past Medical History:  Past Medical History:  Diagnosis Date  . Arthritis    R shoulder, great toes, hands- has gout & has injections in knees with Dr. Estanislado Pandy   . Cancer ([place])    skin ca-   . GERD (gastroesophageal reflux disease)   . History of hiatal hernia   . SDH (subdural hematoma) ([facility])    Past Surgical History:  Past Surgical History:  Procedure Laterality Date  . BRAIN SURGERY  2016   evacuation of SDH  .  CARPAL TUNNEL RELEASE Bilateral   . GREEN LIGHT LASER TURP (TRANSURETHRAL RESECTION OF PROSTATE  04/27/2017  . QUADRICEPS TENDON REPAIR Bilateral 06/15/2017   Procedure: REPAIR QUADRICEP TENDON;  Surgeon: Meredith Pel, MD;  Location: [facility];  Service: Orthopedics;  Laterality: Bilateral;  . SHOULDER SURGERY Right     Assessment & Plan Clinical Impression: Patient is a 66 y.o. year old male with history of SDH, OA bilateral knees, history offalls, partial quad rupture on left but functional. He was admitted on 06/15/17 with recurrent fall with pain and inability to extend either knee due to bilateral quad tendon rupture. He underwent quad tendon repair by Dr. Marlou Sa and is to wear B-KI at all times. Therapy evaluations done and patient with deficits in mobility and ability to carry out ADL tasks. CIR recommended for follow up therapy.  Patient transferred to CIR on 06/19/2017 .   Patient currently requires min with mobility secondary to muscle weakness, decreased cardiorespiratoy endurance, and decreased standing balance, decreased postural control and decreased balance strategies.  Prior to hospitalization, patient was independent  with mobility and lived with Alone in a [place] home.  Home access is 17Stairs to enter.  Patient will benefit from skilled PT intervention to maximize safe functional mobility, minimize fall risk and decrease caregiver burden for planned discharge home with intermittent assist.  Anticipate patient will HHPT vs OPPT at discharge.  PT - End of Session Activity Tolerance: Decreased this session Endurance Deficit: Yes Endurance Deficit Description: 2/2  fatigue PT Assessment Rehab Potential (ACUTE/IP ONLY): Excellent Barriers to Discharge: Decreased caregiver support;Inaccessible home environment PT Patient demonstrates impairments in the following area(s): Balance;Edema;Endurance;Motor;Pain;Safety;Sensory PT Transfers Functional Problem(s): Bed Mobility;Bed to  Chair;Furniture;Car PT Locomotion Functional Problem(s): Stairs;Wheelchair Mobility;Ambulation PT Plan PT Intensity: Minimum of 1-2 x/day ,45 to 90 minutes PT Frequency: 5 out of 7 days PT Duration Estimated Length of Stay: 7-10 days PT Treatment/Interventions: Ambulation/gait training;Community reintegration;DME/adaptive equipment instruction;Neuromuscular re-education;Psychosocial support;Stair training;UE/LE Strength taining/ROM;Wheelchair propulsion/positioning;UE/LE Coordination activities;Therapeutic Activities;Skin care/wound management;Pain management;Discharge planning;Balance/vestibular training;Functional mobility training;Patient/family education;Therapeutic Exercise PT Transfers Anticipated Outcome(s): mod I with LRAD PT Locomotion Anticipated Outcome(s): supervision<>mod I PT Recommendation Recommendations for Other Services: Therapeutic Recreation consult Therapeutic Recreation Interventions: Pet therapy;Outing/community reintergration Follow Up Recommendations:  (HHPT vs OPPT, intermittent Supervision) Patient destination: Home Equipment Recommended: Wheelchair (measurements);Rolling walker with 5" wheels;3 in 1 bedside comode  Skilled Therapeutic Intervention Patient received in w/c & agreeable to tx. PT evaluation initiated with therapist educating him on ELOS, safety plan, use of B bledsoe braces, CIR schedule, weekly interdisciplinary team meeting and other CIR information. Pt provided PLOF & home set up information. Pt propelled w/c room<>gym with BUE & supervision, with extra time. In gym pt ambulated 122 ft with RW & close supervision with short step length BLE. Pt completes sit<>stand transfers with steady assist and significant reliance on arms 2/2 B knee extension in braces. Pt declined further functional mobility tasks 2/2 fatigue after not sleeping well and feeling fatigued. Pt returned to room & transferred w/c>recliner with RW & supervision. Pt performed BUE bicep curls  with 6# dumbbells. At end of session pt left sitting in recliner with all needs within reach, BLE elevated, and ice applied to B knees (educated pt on need to have ice 20 min, 20 min off & pt verbalized understanding).   PT Evaluation Precautions/Restrictions Precautions Precautions: Fall;Knee Required Braces or Orthoses: Knee Immobilizer - Right;Knee Immobilizer - Left (BLEDSOE braces) Knee Immobilizer - Right: On at all times Knee Immobilizer - Left: On at all times Restrictions RLE Weight Bearing: Weight bearing as tolerated LLE Weight Bearing: Weight bearing as tolerated  General Chart Reviewed: Yes Additional Pertinent History: hx SDH, OA B knees, arthritis, hx R shoulder surgery, hiatal hernia, GERD, skin CA Response to Previous Treatment: Patient reporting fatigue but able to participate. 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 Perception WFL  Cognition Overall Cognitive Status: Within Functional Limits for tasks assessed Arousal/Alertness: Awake/alert Orientation Level: Oriented X4 Memory: Appears intact Awareness: Appears intact Safety/Judgment: Appears intact   Sensation Sensation Light Touch:  (numbness & tingling in B feet, impaired sensation lateral aspect L foot otherwise BLE equal) Proprioception: Appears Intact (BLE)  Motor  Motor Motor:  (general weakness)    Mobility Transfers Sit to Stand: 4: Min assist Stand to Sit: 4: Min assist  Locomotion  Ambulation Ambulation: Yes Ambulation/Gait Assistance: 5: Supervision Ambulation Distance (Feet): 122 Feet Assistive device: Rolling walker Gait Gait: Yes Gait Pattern: Decreased hip/knee flexion -  left;Decreased hip/knee flexion - right;Decreased stride length;Decreased step length - right;Decreased step length - left (2/2 B bledsoe braces) Stairs / Additional Locomotion Stairs: No Architect: Yes Wheelchair Assistance: 5: Investment banker, operational Details: Verbal cues for Marketing executive: Both upper extremities Wheelchair Parts Management: Needs assistance Distance: 125 ft   Trunk/Postural Assessment  Cervical Assessment Cervical Assessment: Within Functional Limits Thoracic Assessment Thoracic Assessment: Within Functional Limits Lumbar Assessment Lumbar Assessment: Within Functional Limits Postural Control Postural Control: Within Functional Limits   Balance Balance Balance Assessed: Yes Dynamic Standing Balance Dynamic Standing - Balance Support: Bilateral upper extremity supported Dynamic Standing - Level of Assistance: 5: Stand by assistance Dynamic Standing - Balance Activities:  (during gait)  Extremity Assessment  RLE Assessment RLE Assessment:  (ROM limited by B bledsoe braces locked in extension) LLE Assessment LLE Assessment:  (ROM limited by B bledsoe braces locked in extension)   See Function Navigator for Current Functional Status.   Refer to Care Plan for Long Term Goals  Recommendations for other services: Therapeutic Recreation  Pet therapy, Stress management and Outing/community reintegration  Discharge Criteria: Patient will be discharged from PT if patient refuses treatment 3 consecutive times without medical reason, if treatment goals not met, if there is a change in medical status, if patient  makes no progress towards goals or if patient is discharged from hospital.  The above assessment, treatment plan, treatment alternatives and goals were discussed and mutually agreed upon: by patient  Waunita Schooner 06/20/2017, 12:25 PM

## 2017-06-20 NOTE — Progress Notes (Signed)
Patient information reviewed and entered into eRehab system by Amiir Heckard, RN, CRRN, PPS Coordinator.  Information including medical coding and functional independence measure will be reviewed and updated through discharge.     Per nursing patient was given "Data Collection Information Summary for Patients in Inpatient Rehabilitation Facilities with attached "Privacy Act Statement-Health Care Records" upon admission.  

## 2017-06-20 NOTE — Progress Notes (Addendum)
[facility]     PROGRESS NOTE  Subjective/Complaints:  Pt seen laying in bed this AM.  He did not sleep well overnight because of being in the hospital.  He is ready for therapies.  ROS: Denies CP, SOB, N/V/D.  Objective: Vital Signs: Blood pressure 122/79, pulse 60, temperature 99 F (37.2 C), temperature source Oral, resp. rate 18, height 6' (1.829 m), SpO2 97 %. No results found.  Recent Labs  06/20/17 0410  WBC 7.8  HGB 12.4*  HCT 37.4*  PLT 231    Recent Labs  06/20/17 0410  NA 140  K 3.8  CL 104  GLUCOSE 108*  BUN 12  CREATININE 1.00  CALCIUM 8.7*   CBG (last 3)  No results for input(s): GLUCAP in the last 72 hours.  Wt Readings from Last 3 Encounters:  06/16/17 89.4 kg (197 lb)  04/12/17 89.4 kg (197 lb)  03/01/17 93.4 kg (206 lb)    Physical Exam:  BP 122/79 (BP Location: Right Arm)   Pulse 60   Temp 99 F (37.2 C) (Oral)   Resp 18   Ht 6' (1.829 m)   SpO2 97%  Constitutional: He appears well-developedand well-nourished. NAD. HENT: Normocephalicand atraumatic.  Eyes: EOMare normal. No discharge.  Cardiovascular: Normal rateand regular rhythm. No JVD. Respiratory: Effort normaland breath sounds normal. GI: Soft. Bowel sounds are normal.   Musculoskeletal: He exhibits edema(min edema BLE).  Neurological: He is alertand oriented  Motor: 5/5 b/l UE, b/l ADF/PF B/l HF 4/5 (pain inhibition) Skin: Bilateral knees with surgical dressing in place, c/d/i. Psychiatric: He has a normal mood and affect. His behavior is normal. Judgmentand thought contentnormal.    Assessment/Plan: 1. Functional deficits secondary to bilateral quad tendon ruptures which require 3+ hours per day of interdisciplinary therapy in a comprehensive inpatient rehab setting. Physiatrist is providing close team supervision and 24 hour management of active medical problems listed below. Physiatrist and rehab team continue to assess  barriers to discharge/monitor patient progress toward functional and medical goals.  Function:  Bathing Bathing position      Bathing parts      Bathing assist        Upper Body Dressing/Undressing Upper body dressing                    Upper body assist        Lower Body Dressing/Undressing Lower body dressing                                  Lower body assist        Toileting Toileting          Toileting assist     Transfers Chair/bed transfer             Locomotion Ambulation           Wheelchair          Cognition Comprehension    Expression    Social Interaction    Problem Solving    Memory      Medical Problem List and Plan: 1. Functional and mobility deficitssecondary to bilateral quad tendon ruptures  Begin CIR 2. DVT Prophylaxis/Anticoagulation: Pharmaceutical: Lovenox 3. Pain Management: will continue hydrocodone prn with robaxin for muscle spams. Encourage use of ice and heat.  4. Mood: LCSW to follow for evaluation and support.  5. Neuropsych: This patient iscapable of making  decisions on hisown behalf. 6. Skin/Wound Care: monitor incisions daily for healing. Maintain adequate nutritional and hydration status.  7. Fluids/Electrolytes/Nutrition: monitor I/Os  BMP within acceptable range on 7/4 8. Constipation: Augment bowel program.  9. GERD: on protonix.  10. H/o gout: controlled on allopurinol.  11. Ascending Aortic dilations/ Bicuspid aortic valve: on low dose BB 12. Hypoalbuminemia  Supplement initiated 7/4 13. ABLA  Hb 12.4 on 7/4  Cont to monitor 14. Sleep disturbance  Xanax per pt  LOS (Days) 1 A FACE TO FACE EVALUATION WAS PERFORMED  Nela Bascom Lorie Phenix 06/20/2017 7:55 AM

## 2017-06-20 NOTE — Progress Notes (Signed)
Social Work Assessment and Plan Social Work Assessment and Plan  Patient Details  Name: Jose [name] MRN: [id number] Date of Birth: January 02, 1951  Today's Date: 06/20/2017  Problem List:  Patient Active Problem List   Diagnosis Date Noted  . Hypoalbuminemia due to protein-calorie malnutrition ([place])   . History of gout   . Sleep disturbance   . Constipation   . Rupture of quadriceps tendon, right, sequela 06/19/2017  . Quadriceps tendon rupture, left, sequela 06/19/2017  . Acute blood loss anemia 06/19/2017  . Fall   . History of subdural hematoma   . Post-operative pain   . Recurrent falls   . Quadriceps tendon rupture 06/15/2017  . Primary osteoarthritis of both knees 02/28/2017  . Primary osteoarthritis of both hands 02/28/2017  . Uricacidemia 02/28/2017  . Pain in joint of left knee 02/07/2017  . Idiopathic chronic gout, unspecified site, without tophus (tophi) 11/29/2016  . HEMATURIA UNSPECIFIED 01/25/2009  . ABDOMINAL PAIN 01/25/2009  . XERODERMA 03/10/2008  . UNS ADVRS EFF UNS RX MEDICINAL&BIOLOGICAL SBSTNC 03/10/2008  . ADJUSTMENT REACTION WITH PHYSICAL SYMPTOMS 02/14/2008  . MUSCLE SPASM 02/14/2008  . ACUTE PROSTATITIS 12/16/2007  . BREAST LUMP OR MASS, RIGHT 07/12/2007  . H/O: RCT (rotator cuff tear) 01/13/2007  . GOUT 01/08/2007   Past Medical History:  Past Medical History:  Diagnosis Date  . Arthritis    R shoulder, great toes, hands- has gout & has injections in knees with Dr. Estanislado Pandy   . Cancer ([place])    skin ca-   . GERD (gastroesophageal reflux disease)   . History of hiatal hernia   . SDH (subdural hematoma) ([facility])    Past Surgical History:  Past Surgical History:  Procedure Laterality Date  . BRAIN SURGERY  2016   evacuation of SDH  . CARPAL TUNNEL RELEASE Bilateral   . GREEN LIGHT LASER TURP (TRANSURETHRAL RESECTION OF PROSTATE  04/27/2017  . QUADRICEPS TENDON REPAIR Bilateral 06/15/2017   Procedure: REPAIR QUADRICEP TENDON;  Surgeon: Meredith Pel, MD;  Location: [facility];  Service: Orthopedics;  Laterality: Bilateral;  . SHOULDER SURGERY Right    Social History:  reports that he has been smoking Cigars.  He has never used smokeless tobacco. He reports that he drinks about 1.8 oz of alcohol per week . He reports that he does not use drugs.  Family / Support Systems Marital Status: Single Patient Roles: Other (Comment), Parent (Employee & Friend) Children: Adam-son who is supportive Other Supports: Ishmal Owens-freind [phone]  Donna-freind [phone] Anticipated Caregiver: Pt is working on a plan aware will need assist at home for a short time Ability/Limitations of Caregiver: Lives alone needs to come up with a plan Caregiver Availability: Other (Comment) (working on a plan) Family Dynamics: Close with son and has many friends who are willing to come by and check on him. He will need someone to assist him at discharge and he is working on this. He does not like asking for help from others, he guesses he will need to get over this now.   Social History Preferred language: English Religion: Non-Denominational Cultural Background: No issues Education: Secretary/administrator educated Read: Yes Write: Yes Employment Status: Employed Name of Employer: Consult[employer] Return to Work Plans: Plans to return when able and recovered. Legal Hisotry/Current Legal Issues: No issues Guardian/Conservator: none-according to MD pt is capable of making his own decisions while here.   Abuse/Neglect Physical Abuse: Denies Verbal Abuse: Denies Sexual Abuse: Denies Exploitation of patient/patient's resources: Denies Self-Neglect: Denies  Emotional Status Pt's affect, behavior adn adjustment status: Pt is motviated to do all that he can for himself, since he doesn't like asking for assistance from others. He has always been independent and could take care of himself. He will do all that he can to recover from this, but realizes it will take time  to do so. Recent Psychosocial Issues: healthy prior to admission has had surgery in 2016 but it went well and he recovered quickly. Pyschiatric History: No history deferred depression screen due to doing well and adjusting to the new unit. He is having pain issues and trying to get comfortable with the braces he has to wear at all times. Will monitor and have neuro-psych see if needed. Substance Abuse History: No issues  Patient / Family Perceptions, Expectations & Goals Pt/Family understanding of illness & functional limitations: Pt can explain his injuries and surgerical repair of his tendons. He is not comfortable in the braces and is still adjusting to them. He does talk with the MD and feels he has a good understanding of his plan. Premorbid pt/family roles/activities: father, employee, freind, etc Anticipated changes in roles/activities/participation: resume Pt/family expectations/goals: Pt states: " I want to get as independent as I can be with these things-braces." " I know I will need some help at home for awhile."  [employer]: None Premorbid Home Care/DME Agencies: None Transportation available at discharge: Friends now he was driving prior to admission  Discharge Planning Living Arrangements: Alone Support Systems: Friends/neighbors, Children Type of Residence: Private residence Insurance Resources: [employer], Multimedia programmer (specify) Nurse, mental health) Financial Resources: Employment Financial Screen Referred: No Living Expenses: Own Money Management: Patient Does the patient have any problems obtaining your medications?: No Home Management: Patient or had a house cleaner Patient/Family Preliminary Plans: Return home with a friend or hired assistance. He is still working on his discharge plan. He realizes he will need someone to assist him and is limited due to the braces he needs to wear at all times. Awaiting therapy team's evaluations and will work on a  safe plan for him. Social Work Anticipated Follow Up Needs: HH/OP  Clinical Impression Pleasant gentleman who is motivated to do well here, but has limitations due to the braces he needs to wear all of the time. He has supportive friends and a son. He is working on a discharge plan and will await therapy team's evaluations. Pt has two homes one in [place] and one in [place] will see which one is more accessible for him.  [name] 06/20/2017, 3:13 PM

## 2017-06-20 NOTE — Care Management Note (Signed)
Inpatient Rehabilitation Center Individual Statement of Services  Patient Name:  Jose [name]  Date:  06/20/2017  Welcome to the [facility].  Our goal is to provide you with an individualized program based on your diagnosis and situation, designed to meet your specific needs.  With this comprehensive rehabilitation program, you will be expected to participate in at least 3 hours of rehabilitation therapies Monday-Friday, with modified therapy programming on the weekends.  Your rehabilitation program will include the following services:  Physical Therapy (PT), Occupational Therapy (OT), 24 hour per day rehabilitation nursing, Case Management (Social Worker), Rehabilitation Medicine, Nutrition Services and Pharmacy Services  Weekly team conferences will be held on Wednesday to discuss your progress.  Your Social Worker will talk with you frequently to get your input and to update you on team discussions.  Team conferences with you and your family in attendance may also be held.  Expected length of stay: 7-10 days  Overall anticipated outcome: supervision with bathing and dressing & mod/i ambulation  Depending on your progress and recovery, your program may change. Your Social Worker will coordinate services and will keep you informed of any changes. Your Social Worker's name and contact numbers are listed  below.  The following services may also be recommended but are not provided by the [facility] will be made to provide these services after discharge if needed.  Arrangements include referral to agencies that provide these services.  Your insurance has been verified to be:  [place] Your primary doctor is:  [name]  Pertinent information will be shared with your doctor and your insurance  company.  Social Worker:  Ovidio Kin, [place] or ([phone]  Information discussed with and copy given to patient by: [name], 06/20/2017, 2:58 PM

## 2017-06-20 NOTE — IPOC Note (Signed)
Overall Plan of Care [facility]) Patient Details Name: Jose [name] MRN: [id number] DOB: May 17, 1951  Admitting Diagnosis: B Quad Tendon Ruptures  Hospital Problems: Principal Problem:   Quadriceps tendon rupture, left, sequela Active Problems:   Rupture of quadriceps tendon, right, sequela   Acute blood loss anemia   Hypoalbuminemia due to protein-calorie malnutrition (HCC)   History of gout   Sleep disturbance   Constipation     Functional Problem List: Nursing Bowel, Pain, Safety, Skin Integrity  PT Balance, Edema, Endurance, Motor, Pain, Safety, Sensory  OT Balance, Edema, Endurance, Motor, Pain, Safety, Skin Integrity  SLP    TR         Basic ADL's: OT Grooming, Bathing, Dressing, Toileting     Advanced  ADL's: OT Simple Meal Preparation     Transfers: PT Bed Mobility, Bed to Chair, Furniture, Teacher, early years/pre, Metallurgist: PT Stairs, Emergency planning/management officer, Ambulation     Additional Impairments: OT None  SLP        TR      Anticipated Outcomes Item Anticipated Outcome  Self Feeding n/a  Swallowing      Basic self-care  supervision to mod I   Toileting  mod I    Bathroom Transfers mod I   Bowel/Bladder  Have a bowel movement within a couple of days. Pt continue to be continent of bowel and bladder during admission  Transfers  mod I with LRAD  Locomotion  supervision<>mod I  Communication     Cognition     Pain  Pt to be free from pain or less than 3 during admission  Safety/Judgment  Pt to free from falls during admission   Therapy Plan: PT Intensity: Minimum of 1-2 x/day ,45 to 90 minutes PT Frequency: 5 out of 7 days PT Duration Estimated Length of Stay: 7-10 days OT Intensity: Minimum of 1-2 x/day, 45 to 90 minutes OT Frequency: 5 out of 7 days OT Duration/Estimated Length of Stay: ~7-10 days         Team Interventions: Nursing Interventions Bowel Management, Pain Management, Skin Care/Wound Management  PT interventions  Ambulation/gait training, Community reintegration, DME/adaptive equipment instruction, Neuromuscular re-education, Psychosocial support, Stair training, UE/LE Strength taining/ROM, Wheelchair propulsion/positioning, UE/LE Coordination activities, Therapeutic Activities, Skin care/wound management, Pain management, Discharge planning, Training and development officer, Functional mobility training, Patient/family education, Therapeutic Exercise  OT Interventions Balance/vestibular training, Disease mangement/prevention, Self Care/advanced ADL retraining, Therapeutic Exercise, Wheelchair propulsion/positioning, UE/LE Strength taining/ROM, Skin care/wound managment, Pain management, DME/adaptive equipment instruction, Community reintegration, Barrister's clerk education, UE/LE Coordination activities, Therapeutic Activities, Psychosocial support, Functional mobility training, Discharge planning  SLP Interventions    TR Interventions    SW/CM Interventions Discharge Planning, Psychosocial Support, Patient/Family Education    Team Discharge Planning: Destination: PT-Home ,OT- Home , SLP-  Projected Follow-up: PT- (HHPT vs OPPT, intermittent Supervision), OT-  Home health OT, SLP-  Projected Equipment Needs: PT-Wheelchair (measurements), Rolling walker with 5" wheels, 3 in 1 bedside comode, OT- To be determined, Tub/shower bench, SLP-  Equipment Details: PT- , OT-  Patient/family involved in discharge planning: PT- Patient,  OT-Patient, SLP-   MD ELOS: 3-6 days. Medical Rehab Prognosis:  Excellent Assessment:  65 y.o.malewith history of SDH, OA bilateral knees, history offalls, partial quad rupture on left but functional. He was admitted on 06/15/17 with recurrent fall with pain and inability to extend either knee due to bilateral quad tendon rupture. He underwent quad tendon repair by Dr. Marlou Sa and is to wear B-KI at  all times. Therapy evaluations done and patient with deficits in mobility and ability to carry  out ADL tasks. Will set goals for Mod I/Supervision with PT/OT.    See Team Conference Notes for weekly updates to the plan of care

## 2017-06-20 NOTE — Evaluation (Signed)
Occupational Therapy Assessment and Plan  Patient Details  Name: LESLIE LANGILLE MRN: [id number] Date of Birth: [birth year]  OT Diagnosis: acute pain and muscle weakness (generalized) Rehab Potential: Rehab Potential (ACUTE ONLY): Good ELOS: ~7-10 days   Today's Date: 06/20/2017 OT Individual Time: 2458-0998 OT Individual Time Calculation (min): 60 min     Problem List:  Patient Active Problem List   Diagnosis Date Noted  . Hypoalbuminemia due to protein-calorie malnutrition ([place])   . History of gout   . Sleep disturbance   . Constipation   . Rupture of quadriceps tendon, right, sequela 06/19/2017  . Quadriceps tendon rupture, left, sequela 06/19/2017  . Acute blood loss anemia 06/19/2017  . Fall   . History of subdural hematoma   . Post-operative pain   . Recurrent falls   . Quadriceps tendon rupture 06/15/2017  . Primary osteoarthritis of both knees 02/28/2017  . Primary osteoarthritis of both hands 02/28/2017  . Uricacidemia 02/28/2017  . Pain in joint of left knee 02/07/2017  . Idiopathic chronic gout, unspecified site, without tophus (tophi) 11/29/2016  . HEMATURIA UNSPECIFIED 01/25/2009  . ABDOMINAL PAIN 01/25/2009  . XERODERMA 03/10/2008  . UNS ADVRS EFF UNS RX MEDICINAL&BIOLOGICAL SBSTNC 03/10/2008  . ADJUSTMENT REACTION WITH PHYSICAL SYMPTOMS 02/14/2008  . MUSCLE SPASM 02/14/2008  . ACUTE PROSTATITIS 12/16/2007  . BREAST LUMP OR MASS, RIGHT 07/12/2007  . H/O: RCT (rotator cuff tear) 01/13/2007  . GOUT 01/08/2007    Past Medical History:  Past Medical History:  Diagnosis Date  . Arthritis    R shoulder, great toes, hands- has gout & has injections in knees with Dr. Estanislado Pandy   . Cancer ([place])    skin ca-   . GERD (gastroesophageal reflux disease)   . History of hiatal hernia   . SDH (subdural hematoma) ([facility])    Past Surgical History:  Past Surgical History:  Procedure Laterality Date  . BRAIN SURGERY  2016   evacuation of SDH  . CARPAL TUNNEL RELEASE  Bilateral   . GREEN LIGHT LASER TURP (TRANSURETHRAL RESECTION OF PROSTATE  04/27/2017  . QUADRICEPS TENDON REPAIR Bilateral 06/15/2017   Procedure: REPAIR QUADRICEP TENDON;  Surgeon: Meredith Pel, MD;  Location: [facility];  Service: Orthopedics;  Laterality: Bilateral;  . SHOULDER SURGERY Right     Assessment & Plan Clinical Impression: Patient is a 66 y.o. year old male  history of SDH, OA bilateral knees, history offalls, partial quad rupture on left but functional. He was admitted on 06/15/17 with recurrent fall with pain and inability to extend either knee due to bilateral quad tendon rupture. He underwent quad tendon repair by Dr. Marlou Sa and is to wear B-KI at all times. Therapy evaluations done and patient with deficits in mobility and ability to carry out ADL tasks. Patient transferred to CIR on 06/19/2017 .    Patient currently requires mod with basic self-care skills and functional mobility  secondary to muscle weakness and acute pain, decreased cardiorespiratoy endurance and decreased standing balance, decreased balance strategies and difficulty maintaining precautions.  Prior to hospitalization, patient could complete ADL with independent .  Patient will benefit from skilled intervention to decrease level of assist with basic self-care skills and increase independence with basic self-care skills prior to discharge home with care partner.  Anticipate patient will require intermittent supervision and follow up home health.  OT - End of Session Activity Tolerance: Tolerates 30+ min activity with multiple rests Endurance Deficit: Yes Endurance Deficit Description: alot to due with pain  OT Assessment Rehab Potential (ACUTE ONLY): Good Barriers to Discharge: Inaccessible home environment Barriers to Discharge Comments: 17 stairs, will need A with transportation OT Patient demonstrates impairments in the following area(s): Balance;Edema;Endurance;Motor;Pain;Safety;Skin Integrity OT Basic  ADL's Functional Problem(s): Grooming;Bathing;Dressing;Toileting OT Advanced ADL's Functional Problem(s): Simple Meal Preparation OT Transfers Functional Problem(s): Toilet;Tub/Shower OT Additional Impairment(s): None OT Plan OT Intensity: Minimum of 1-2 x/day, 45 to 90 minutes OT Frequency: 5 out of 7 days OT Duration/Estimated Length of Stay: ~7-10 days OT Treatment/Interventions: Balance/vestibular training;Disease mangement/prevention;Self Care/advanced ADL retraining;Therapeutic Exercise;Wheelchair propulsion/positioning;UE/LE Strength taining/ROM;Skin care/wound managment;Pain management;DME/adaptive equipment instruction;Community reintegration;Patient/family education;UE/LE Coordination activities;Therapeutic Activities;Psychosocial support;Functional mobility training;Discharge planning OT Self Feeding Anticipated Outcome(s): n/a OT Basic Self-Care Anticipated Outcome(s): supervision to mod I  OT Toileting Anticipated Outcome(s): mod I  OT Bathroom Transfers Anticipated Outcome(s): mod I  OT Recommendation Recommendations for Other Services: Neuropsych consult Patient destination: Home Follow Up Recommendations: Home health OT Equipment Recommended: To be determined;Tub/shower bench   Skilled Therapeutic Intervention 1:1 Ot eval initiated with Ot goals, purpose and role discussed. Self care retraining at sink level. Discussed showering (but would need to keep BLEDSOE braces donned and cover them. Pt performed UB bathing and dressing in standing position. Pt able to perform sit to stand with min to mod A and min A to help control decent along with VC for sequencing and hand placement. Pt required total A for threading LB clothing - discussed using reacher tomorrow to improve independence. Pt demonstrating good activity tolerate this session. Discussed d/c plans and possible options for DME.    2nd session pt decline activity due to fatigue. Missed 30 min   OT  Evaluation Precautions/Restrictions  Precautions Precautions: Fall;Knee Required Braces or Orthoses: Knee Immobilizer - Right;Knee Immobilizer - Left (BLEDSOE braces) Knee Immobilizer - Right: On at all times Knee Immobilizer - Left: On at all times Restrictions RLE Weight Bearing: Weight bearing as tolerated LLE Weight Bearing: Weight bearing as tolerated General Chart Reviewed: Yes Family/Caregiver Present: No    Pain  6/10 in left knee - applied ice after session and allowed for rest as needed.  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 User may not have seen previous data.) Arousal/Alertness: Awake/alert Orientation Level: Person;Place;Situation Person: Oriented Place: Oriented Year: 2018 Month: July Day of Week: Correct Memory: Appears intact Immediate Memory Recall: Sock;Blue;Bed Memory Recall: Sock;Blue;Bed Memory Recall Sock: Without Cue Memory Recall Blue: Without Cue Memory Recall  Bed: Without Cue Attention: Selective Selective Attention: Appears intact Awareness: Appears intact Problem Solving: Appears intact Safety/Judgment: Appears intact Sensation Sensation Light Touch: Appears Intact Stereognosis: Appears Intact Hot/Cold: Appears Intact Proprioception: Appears Intact Coordination Gross Motor Movements are Fluid and Coordinated: No Fine Motor Movements are Fluid and Coordinated:  Yes Heel Shin Test: limited LE mobility due to precautions Motor  Motor Motor - Skilled Clinical Observations: generalized weakness; acute pain Mobility  Transfers Transfers: Sit to Stand;Stand to Sit Sit to Stand: 4: Min assist Stand to Sit: 4: Min assist  Trunk/Postural Assessment  Cervical Assessment Cervical Assessment: Within Functional Limits Thoracic Assessment Thoracic Assessment: Within Functional Limits Lumbar Assessment Lumbar Assessment: Within Functional Limits Postural Control Postural Control: Within Functional Limits  Balance Balance Balance Assessed: Yes Dynamic Sitting Balance Dynamic Sitting - Balance Support: During functional activity Dynamic Sitting - Level of Assistance: 5: Stand by assistance Static Standing Balance Static Standing - Level of Assistance: 5: Stand by assistance;4: Min assist Dynamic Standing Balance Dynamic Standing - Level of Assistance: 4: Min assist;3: Mod assist Extremity/Trunk Assessment RUE Assessment RUE Assessment: Within Functional Limits LUE Assessment LUE Assessment: Within Functional Limits   See Function Navigator for Current Functional Status.   Refer to Care Plan for Long Term Goals  Recommendations for other services: Neuropsych   Discharge Criteria: Patient will be discharged from OT if patient refuses treatment 3 consecutive times without medical reason, if treatment goals not met, if there is a change in medical status, if patient makes no progress towards goals or if patient is discharged from  hospital.  The above assessment, treatment plan, treatment alternatives and goals were discussed and mutually agreed upon: by patient  Nicoletta Ba 06/20/2017, 10:25 AM

## 2017-06-21 ENCOUNTER — Inpatient Hospital Stay (HOSPITAL_COMMUNITY): Payer: Medicare Other | Admitting: Physical Therapy

## 2017-06-21 ENCOUNTER — Inpatient Hospital Stay (HOSPITAL_COMMUNITY): Payer: Medicare Other | Admitting: Occupational Therapy

## 2017-06-21 ENCOUNTER — Inpatient Hospital Stay (HOSPITAL_COMMUNITY): Payer: Medicare Other | Admitting: *Deleted

## 2017-06-21 DIAGNOSIS — R269 Unspecified abnormalities of gait and mobility: Secondary | ICD-10-CM

## 2017-06-21 DIAGNOSIS — K5909 Other constipation: Secondary | ICD-10-CM

## 2017-06-21 DIAGNOSIS — G8918 Other acute postprocedural pain: Secondary | ICD-10-CM

## 2017-06-21 DIAGNOSIS — K5903 Drug induced constipation: Secondary | ICD-10-CM

## 2017-06-21 DIAGNOSIS — T50904A Poisoning by unspecified drugs, medicaments and biological substances, undetermined, initial encounter: Secondary | ICD-10-CM

## 2017-06-21 NOTE — Progress Notes (Signed)
Occupational Therapy Session Note  Patient Details  Name: RICKE KIMOTO MRN: [id number] Date of Birth: [birth year]  Today's Date: 06/21/2017 OT Individual Time: 0930-1030 OT Individual Time Calculation (min): 60 min    Short Term Goals: Week 1:  OT Short Term Goal 1 (Week 1): STG=LTG  Skilled Therapeutic Interventions/Progress Updates:    1:1 Pt reported having a better night and was able to get some rest. Pt able to ambulate to the bathroom with steadying with demonstrating improved ability to perform sit to stands today. Pt showered sitting on tub bench facing out of the shower with LEs propped out (in extension) in the w/c. PT able to bathe peri area up with setup. When pt returned to recliner - pt able to thread pants with reacher with LEs extended out on recliner leg rest. Pt performed lateral leans to pull up pants.  Therapist assisted with bathing lower LEs with braces open with LEs supported. Left in recliner to rest with ice on bilateral knees.   Therapy Documentation Precautions:  Precautions Precautions: Fall, Knee Required Braces or Orthoses: Knee Immobilizer - Right, Knee Immobilizer - Left (BLEDSOE braces) Knee Immobilizer - Right: On at all times Knee Immobilizer - Left: On at all times Restrictions Weight Bearing Restrictions: Yes RLE Weight Bearing: Weight bearing as tolerated LLE Weight Bearing: Weight bearing as tolerated Pain: No report of active pain but iced knees after session.  ADL: ADL ADL Comments: see functional navigator  See Function Navigator for Current Functional Status.   Therapy/Group: Individual Therapy  Willeen Cass [facility] 06/21/2017, 2:26 PM

## 2017-06-21 NOTE — Progress Notes (Signed)
Social Work [place]inda [place], Eliezer Champagne Social Worker Signed   Patient Care Conference Date of Service: 06/20/2017  2:55 PM      Hide copied text Hover for attribution information Inpatient RehabilitationTeam Conference and Plan of Care Update Date: 06/20/2017   Time: 10:25 AM      Patient Name: Jose [name]      Medical Record Number: [id number]  Date of Birth: 23-Sep-1951 Sex: Male         Room/Bed: 4M05C/4M05C-01 Payor Info: Payor: MEDICARE / Plan: MEDICARE PART A AND B / Product Type: *No Product type* /     Admitting Diagnosis: B Quad Tendon Ruptures  Admit Date/Time:  06/19/2017  5:09 PM Admission Comments: No comment available    Primary Diagnosis:  Quadriceps tendon rupture, left, sequela Principal Problem: Quadriceps tendon rupture, left, sequela       Patient Active Problem List    Diagnosis Date Noted  . Abnormality of gait    . Hypoalbuminemia due to protein-calorie malnutrition ([place])    . History of gout    . Sleep disturbance    . Constipation    . Rupture of quadriceps tendon, right, sequela 06/19/2017  . Quadriceps tendon rupture, left, sequela 06/19/2017  . Acute blood loss anemia 06/19/2017  . Fall    . History of subdural hematoma    . Post-operative pain    . Recurrent falls    . Quadriceps tendon rupture 06/15/2017  . Primary osteoarthritis of both knees 02/28/2017  . Primary osteoarthritis of both hands 02/28/2017  . Uricacidemia 02/28/2017  . Pain in joint of left knee 02/07/2017  . Idiopathic chronic gout, unspecified site, without tophus (tophi) 11/29/2016  . HEMATURIA UNSPECIFIED 01/25/2009  . ABDOMINAL PAIN 01/25/2009  . XERODERMA 03/10/2008  . UNS ADVRS EFF UNS RX MEDICINAL&BIOLOGICAL SBSTNC 03/10/2008  . ADJUSTMENT REACTION WITH PHYSICAL SYMPTOMS 02/14/2008  . MUSCLE SPASM 02/14/2008  . ACUTE PROSTATITIS 12/16/2007  . BREAST LUMP OR MASS, RIGHT 07/12/2007  . H/O: RCT (rotator cuff tear) 01/13/2007  . GOUT 01/08/2007      Expected Discharge  Date:     Team Members Present: Physician leading conference: Dr. Delice Lesch Social Worker Present: Ovidio Kin, LCSW Nurse Present: Other (comment) Alda Lea) PT Present: Kem Parkinson, PT OT Present: Clyda Greener, OT SLP Present: Windell Moulding, SLP PPS Coordinator present : Daiva Nakayama, RN, CRRN       Current Status/Progress Goal Weekly Team Focus  Medical     Functional and mobility deficits secondary to bilateral quad tendon ruptures  Improve mobility, transfers, ABLA, constipation, sleep  See above   Bowel/Bladder     continenet of bowel & bladder, LBM 06/12/17, reported to have refused miralax on other floor, given miralx & sorbital tonight  remain continent, regain a pattern for BMs  continue to monitor   Swallow/Nutrition/ Hydration               ADL's       eval pending        Mobility     supervision for ambulation with RW up to 122 ft, steady assist transfers, supervision w/c mobility  Mod I overall, supervision stair negotiation & car transfer  pt education, transfers, gait, stair negotiation, endurance, w/c mobility   Communication               Safety/Cognition/ Behavioral Observations             Pain     c/o pain tonight 6/10  to the left knee, has tylenol, tramadol, robaxin & norco prn  pain scale <4  continue to assess & treat as needed   Skin     mepilex drsgs to bil knees incisions, no drainage noted, edema to both  no signs of infection, no new areas of skin break down  assess q shift     *See Care Plan and progress notes for long and short-term goals.   Barriers to Discharge: Mobility, transfers, ABLA, constipation, sleep     Possible Resolutions to Barriers:  Therapies, follow labs, optimize bowel reg     Discharge Planning/Teaching Needs:    HOme with his friend assisting him. He realizes he will need some help at discharge from rehab.     Team Discussion:  New eval-setting goals for home. Pt is having pain issues which MD is aware  of and working on pain management.  Revisions to Treatment Plan:  New eval    Continued Need for Acute Rehabilitation Level of Care: The patient requires daily medical management by a physician with specialized training in physical medicine and rehabilitation for the following conditions: Daily direction of a multidisciplinary physical rehabilitation program to ensure safe treatment while eliciting the highest outcome that is of practical value to the patient.: Yes Daily medical management of patient stability for increased activity during participation in an intensive rehabilitation regime.: Yes Daily analysis of laboratory values and/or radiology reports with any subsequent need for medication adjustment of medical intervention for : Post surgical problems;Wound care problems;Other   [name] 06/21/2017, 8:41 AM       Patient ID: Jose [name], male   DOB: 03/14/1951, 66 y.o.   MRN: [id number]

## 2017-06-21 NOTE — Progress Notes (Signed)
Physical Therapy Session Note  Patient Details  Name: Jose [name] MRN: [id number] Date of Birth: May 20, 1951  Today's Date: 06/21/2017 PT Individual Time: 1430-1500 PT Individual Time Calculation (min): 30 min   Short Term Goals: Week 1:  PT Short Term Goal 1 (Week 1): STG = LTG due to short ELOS.  Skilled Therapeutic Interventions/Progress Updates: Pt received seated in recliner, denies pain and agreeable to treatment. Pt dons bilateral bledsoe braces with modI. Sit <>stand x4 during session with close S, armrests and RW. Gait to/from gym with RW and S, slow speed. Tennis balls donned to RW to improve pt comfort d/t report of feeling like it is "driving crooked"; reports increased ease of management after. Standing heel raises x15 reps. Remained seated in recliner at end of session, all needs in reach.      Therapy Documentation Precautions:  Precautions Precautions: Fall, Knee Required Braces or Orthoses: Knee Immobilizer - Right, Knee Immobilizer - Left (BLEDSOE braces) Knee Immobilizer - Right: On at all times Knee Immobilizer - Left: On at all times Restrictions Weight Bearing Restrictions: Yes RLE Weight Bearing: Weight bearing as tolerated LLE Weight Bearing: Weight bearing as tolerated   See Function Navigator for Current Functional Status.   Therapy/Group: Individual Therapy  Luberta Mutter 06/21/2017, 3:02 PM

## 2017-06-21 NOTE — Progress Notes (Signed)
Physical Therapy Session Note  Patient Details  Name: Jose [name] MRN: [id number] Date of Birth: [birth year]  Today's Date: 06/21/2017 PT Individual Time: 1300-1400 PT Individual Time Calculation (min): 60 min   Short Term Goals: Week 1:  PT Short Term Goal 1 (Week 1): STG = LTG due to short ELOS.  Skilled Therapeutic Interventions/Progress Updates:    no c/o pain throughout session focus on activity tolerance, gait, and stair negotiation.  Pt with increased anxiety with all new activities per self report; states that upon his arrival to the ED, following his xray he fell trying to get into the transport chair and during his first therapy session his knees buckled, which have led him to be fearful of falling and of attempting new things.  PT provided emotional support and used therapeutic use of self for pt comfort with new activities.   Pt ambulates to and from therapy gym with RW and supervision.  PT instructed pt in stair negotiation with 2 rails, forward negotiation up 3" stairs and down 6" steps.  Pt uses UEs to lift BLEs up and lower to sequential step on descent, discussed only having 1 rail at home and needing to work towards being able to negotiate stairs up and down with 1 rail.  Pt verbalized understanding.  Returned to room at end of session, in recliner with call bell in reach and needs met.   Therapy Documentation Precautions:  Precautions Precautions: Fall, Knee Required Braces or Orthoses: Knee Immobilizer - Right, Knee Immobilizer - Left (BLEDSOE braces) Knee Immobilizer - Right: On at all times Knee Immobilizer - Left: On at all times Restrictions Weight Bearing Restrictions: Yes RLE Weight Bearing: Weight bearing as tolerated LLE Weight Bearing: Weight bearing as tolerated  See Function Navigator for Current Functional Status.   Therapy/Group: Individual Therapy  Keidra Withers E Penven-Crew 06/21/2017, 3:05 PM

## 2017-06-21 NOTE — Progress Notes (Signed)
Physical Therapy Session Note  Patient Details  Name: Jose [name] MRN: [id number] Date of Birth: [birth year]  Today's Date: 06/21/2017 PT Individual Time: 5329-9242 PT Individual Time Calculation (min): 53 min   Short Term Goals: Week 1:  PT Short Term Goal 1 (Week 1): STG = LTG due to short ELOS.  Skilled Therapeutic Interventions/Progress Updates:  Pt received in recliner & agreeable to tx. Pt completes sit<>stand and stand pivot transfers recliner <>w/c with supervision and use of RW. Pt propelled w/c unit<>outside for BUE strengthening & cardiopulmonary endurance training. Pt unable to propel entire way outside but at least 150 ft at a time. Back in room pt performed BLE hip adduction squeezes & ankle pumps with blue theraband. Pt then reporting BLE fatigue & requesting to end session. Pt left in recliner with BLE elevated & all needs within reach, ice applied to B knees.  Therapy Documentation Precautions:  Precautions Precautions: Fall, Knee Required Braces or Orthoses: Knee Immobilizer - Right, Knee Immobilizer - Left (BLEDSOE braces) Knee Immobilizer - Right: On at all times Knee Immobilizer - Left: On at all times Restrictions Weight Bearing Restrictions: Yes RLE Weight Bearing: Weight bearing as tolerated LLE Weight Bearing: Weight bearing as tolerated  General: PT Amount of Missed Time (min): 7 Minutes PT Missed Treatment Reason: Patient fatigue  Pain: 3/10 B knees - ice applied at beginning and end of session.   See Function Navigator for Current Functional Status.   Therapy/Group: Individual Therapy  Waunita Schooner 06/21/2017, 4:39 PM

## 2017-06-21 NOTE — Progress Notes (Signed)
[facility] PHYSICAL MEDICINE & REHABILITATION     PROGRESS NOTE  Subjective/Complaints:  Pt seen laying in bed this AM.  He states she had a much better night last night with the Xanax.  ROS: Denies CP, SOB, N/V/D.  Objective: Vital Signs: Blood pressure 124/88, pulse 63, temperature 97.9 F (36.6 C), temperature source Oral, resp. rate 18, height 6' (1.829 m), SpO2 100 %. No results found.  Recent Labs  06/20/17 0410  WBC 7.8  HGB 12.4*  HCT 37.4*  PLT 231    Recent Labs  06/20/17 0410  NA 140  K 3.8  CL 104  GLUCOSE 108*  BUN 12  CREATININE 1.00  CALCIUM 8.7*   CBG (last 3)  No results for input(s): GLUCAP in the last 72 hours.  Wt Readings from Last 3 Encounters:  06/16/17 89.4 kg (197 lb)  04/12/17 89.4 kg (197 lb)  03/01/17 93.4 kg (206 lb)    Physical Exam:  BP 124/88 (BP Location: Left Arm)   Pulse 63   Temp 97.9 F (36.6 C) (Oral)   Resp 18   Ht 6' (1.829 m)   SpO2 100%  Constitutional: He appears well-developedand well-nourished. NAD. HENT: Normocephalicand atraumatic.  Eyes: EOMare normal. No discharge.  Cardiovascular: RRR. No JVD. Respiratory: Effort normal and breath sounds normal. GI: Soft. Bowel sounds are normal.   Musculoskeletal: He exhibits edema BLE.  Neurological: He is alert and oriented  Motor: 5/5 b/l UE, b/l ADF/PF B/l HF 4/5 (pain inhibition, improving) Skin: Bilateral knees with surgical dressing in place, c/d/i. Psychiatric: He has a normal mood and affect. His behavior is normal. Judgmentand thought contentnormal.    Assessment/Plan: 1. Functional deficits secondary to bilateral quad tendon ruptures which require 3+ hours per day of interdisciplinary therapy in a comprehensive inpatient rehab setting. Physiatrist is providing close team supervision and 24 hour management of active medical problems listed below. Physiatrist and rehab team continue to assess barriers to discharge/monitor patient progress toward  functional and medical goals.  Function:  Bathing Bathing position   Position: Standing at sink  Bathing parts Body parts bathed by patient: Right arm, Left arm, Chest, Abdomen, Front perineal area, Buttocks, Right upper leg, Left upper leg Body parts bathed by helper: Back  Bathing assist Assist Level: Touching or steadying assistance(Pt > 75%)      Upper Body Dressing/Undressing Upper body dressing   What is the patient wearing?: Pull over shirt/dress     Pull over shirt/dress - Perfomed by patient: Thread/unthread right sleeve, Thread/unthread left sleeve, Put head through opening, Pull shirt over trunk          Upper body assist Assist Level: Set up      Lower Body Dressing/Undressing Lower body dressing   What is the patient wearing?: Pants, Non-skid slipper socks     Pants- Performed by patient: Pull pants up/down Pants- Performed by helper: Thread/unthread right pants leg, Thread/unthread left pants leg   Non-skid slipper socks- Performed by helper: Don/doff right sock, Don/doff left sock                  Lower body assist Assist for lower body dressing: Touching or steadying assistance (Pt > 75%)      Toileting Toileting   Toileting steps completed by patient: Adjust clothing prior to toileting, Performs perineal hygiene, Adjust clothing after toileting      Toileting assist Assist level: Supervision or verbal cues   Transfers Chair/bed transfer   Chair/bed transfer method:  Ambulatory Chair/bed transfer assist level: Supervision or verbal cues Chair/bed transfer assistive device: Medical sales representative     Max distance: 122 ft Assist level: Supervision or verbal cues   Wheelchair   Type: Manual Max wheelchair distance: 125 ft Assist Level: Supervision or verbal cues  Cognition Comprehension Comprehension assist level: Follows complex conversation/direction with extra time/assistive device  Expression Expression assist level:  Expresses complex ideas: With extra time/assistive device  Social Interaction Social Interaction assist level: Interacts appropriately with others with medication or extra time (anti-anxiety, antidepressant).  Problem Solving Problem solving assist level: Solves complex problems: With extra time  Memory Memory assist level: Recognizes or recalls 90% of the time/requires cueing < 10% of the time    Medical Problem List and Plan: 1. Functional and mobility deficitssecondary to bilateral quad tendon ruptures on 6/29  Cont CIR 2. DVT Prophylaxis/Anticoagulation: Pharmaceutical: Lovenox 3. Pain Management: will continue hydrocodone prn with robaxin for muscle spams. Encourage use of ice and heat.  4. Mood: LCSW to follow for evaluation and support.  5. Neuropsych: This patient iscapable of making decisions on hisown behalf. 6. Skin/Wound Care: monitor incisions daily for healing. Maintain adequate nutritional and hydration status.  7. Fluids/Electrolytes/Nutrition: monitor I/Os  BMP within acceptable range on 7/4 8. Constipation: Augment bowel program.   Improving 9. GERD: on protonix.  10. H/o gout: controlled on allopurinol.  11. Ascending Aortic dilations/ Bicuspid aortic valve: on low dose BB 12. Hypoalbuminemia  Supplement initiated 7/4 13. ABLA  Hb 12.4 on 7/4  Cont to monitor 14. Sleep disturbance  Xanax per pt  Improving  LOS (Days) 2 A FACE TO FACE EVALUATION WAS PERFORMED  Sheba Whaling Lorie Phenix 06/21/2017 7:58 AM

## 2017-06-21 NOTE — Progress Notes (Signed)
Physical Therapy Session Note  Patient Details  Name: Jose [name] MRN: [id number] Date of Birth: 1951/08/12  Today's Date: 06/20/2017 PT Individual Time: 1630-1700   30 min  Short Term Goals: Week 1:  PT Short Term Goal 1 (Week 1): STG = LTG due to short ELOS.  Skilled Therapeutic Interventions/Progress Updates:   PT instructed pt in Seat BLE therex due to pt declining any ambulation at this time.  BLE hip abduction 2x 10  BLE SLE 2 x 10 .  Ankle PF with level 2 tband 2x 15  Pt provided min assist to initiate LE exercises due to pain in L knee. Mod cues for proper ROM and speed of movement. Education for ankle pumps and BLE positioning to decrease edema around LLE.   Pt left sitting in WC with call bell in reach.      Therapy Documentation Precautions:  Precautions Precautions: Fall, Knee Required Braces or Orthoses: Knee Immobilizer - Right, Knee Immobilizer - Left (BLEDSOE braces) Knee Immobilizer - Right: On at all times Knee Immobilizer - Left: On at all times Restrictions Weight Bearing Restrictions: Yes RLE Weight Bearing: Weight bearing as tolerated LLE Weight Bearing: Weight bearing as tolerated Vital Signs: Therapy Vitals Temp: 97.9 F (36.6 C) Temp Source: Oral Pulse Rate: 63 Resp: 18 BP: 124/88 Patient Position (if appropriate): Lying Oxygen Therapy SpO2: 100 % O2 Device: Not Delivered Pain:   4/10  . L  Knee  See Function Navigator for Current Functional Status.   Therapy/Group: Individual Therapy  Lorie Phenix 06/21/2017, 8:05 AM

## 2017-06-22 ENCOUNTER — Inpatient Hospital Stay (HOSPITAL_COMMUNITY): Payer: Medicare Other

## 2017-06-22 ENCOUNTER — Inpatient Hospital Stay (HOSPITAL_COMMUNITY): Payer: Medicare Other | Admitting: Occupational Therapy

## 2017-06-22 ENCOUNTER — Inpatient Hospital Stay (HOSPITAL_COMMUNITY): Payer: Medicare Other | Admitting: Physical Therapy

## 2017-06-22 MED ORDER — SENNOSIDES-DOCUSATE SODIUM 8.6-50 MG PO TABS
2.0000 | ORAL_TABLET | Freq: Every day | ORAL | Status: DC
  Administered 2017-06-22 – 2017-06-26 (×5): 2 via ORAL
  Filled 2017-06-22 (×5): qty 2

## 2017-06-22 NOTE — Progress Notes (Signed)
Physical Therapy Session Note  Patient Details  Name: Jose [name] MRN: [id number] Date of Birth: November 25, 1951  Today's Date: 06/22/2017 PT Individual Time: 1000-1100, 1540-1625 PT Individual Time Calculation (min): 60 min , 45 min  Short Term Goals: Week 1:  PT Short Term Goal 1 (Week 1): STG = LTG due to short ELOS.  Skilled Therapeutic Interventions/Progress Updates:    Session 1: 1000-1100 Pt sitting in recliner upon PT arrival, agreeable to therapy tx. Pt ambulated x 250 ft, x 172 ft, x165 ft and x133ft with supervision and RW in order to work on endurance, activity tolerance and LE weightbearing tolerance. Worked on stair training with 2 hand rails up/down four 6 inch steps with min assist for balance and verbal cues for technique. Pt ascended/descended six 3 inch steps using single handrail and one crutch, min assist for balance, verbal cueing for technique and crutch placement. Pt reported tolerable aching pain throughout session. Pt left sitting in recliner with call bell in reach and ice for pain releif.   Session 2: 1545-1630 Pt sitting in recliner upon PT arrival, agreeable to therapy tx. Pt reported increased pain and fatigue, requesting to work on w/c propulsion to limit LE weightbearing. Pt transferred from recliner > w/c min assist with RW. Sitting in w/c, assist for set up to don leg rests and lift LEs. Pt propelled w/c x 200 ft, x 170 ft, x 168 ft with rest breaks between. During rest breaks discussed possible options for stair navigation when pt goes home. He is trying to see if his landloard will add a rail, also discussed either using a crutch, hemi walker or quad cane if a second rail is not available. Pt transferred back to recliner from w/c, stand step transfer with RW and min assist. Pt left in recliner with needs in reach.   Therapy Documentation Precautions:  Precautions Precautions: Fall, Knee Required Braces or Orthoses: Knee Immobilizer - Right, Knee Immobilizer -  Left (BLEDSOE braces) Knee Immobilizer - Right: On at all times Knee Immobilizer - Left: On at all times Restrictions Weight Bearing Restrictions: Yes RLE Weight Bearing: Weight bearing as tolerated LLE Weight Bearing: Weight bearing as tolerated   See Function Navigator for Current Functional Status.   Therapy/Group: Individual Therapy  Netta Corrigan, PT, DPT 06/22/2017, 10:53 AM

## 2017-06-22 NOTE — Progress Notes (Signed)
Occupational Therapy Session Note  Patient Details  Name: Jose [name] MRN: [id number] Date of Birth: December 04, 1951  Today's Date: 06/22/2017 OT Individual Time: 0254-2706 OT Individual Time Calculation (min): 32 min    Short Term Goals: Week 1:  OT Short Term Goal 1 (Week 1): STG=LTG  Skilled Therapeutic Interventions/Progress Updates:    Pt completed sit to stand from the lowered recliner with min assist.  Noted his feet sliding with use of the gripper socks.  He reports anticipating use of shoes once he gets them in, but does not have any at the hospital at this time.  He was able to ambulate down to the tub/shower room with close supervision using the RW for support.  Practiced tub/shower transfers using the tub bench.  He was able to complete transfer into the tub with min guard assist.  Min assist also needed for practice of removing left Bledsoe brace and then donning while keeping his left knee straight.  Will need to get clearance from MD regarding if braces can be removed for showering.  Ambulated to the ADL apartment for practice with bed transfers as well.  Supervision for transition in and out of the bed.  Pt reports that he is going to have a higher bed arranged at home as well.  Ambulated back to the room and to the recliner with supervision to conclude session.  Pt left with call button and phone in reach.    Therapy Documentation Precautions:  Precautions Precautions: Fall, Knee Required Braces or Orthoses: Knee Immobilizer - Right, Knee Immobilizer - Left Knee Immobilizer - Right: On at all times Knee Immobilizer - Left: On at all times Restrictions Weight Bearing Restrictions: No RLE Weight Bearing: Weight bearing as tolerated LLE Weight Bearing: Weight bearing as tolerated  Pain: Pain Assessment Pain Assessment: No/denies pain ADL: See Function Navigator for Current Functional Status.   Therapy/Group: Individual Therapy  Karigan Cloninger OTR/L 06/22/2017, 2:56  PM

## 2017-06-22 NOTE — Progress Notes (Signed)
Occupational Therapy Session Note  Patient Details  Name: Jose [name] MRN: [id number] Date of Birth: 02-05-1951  Today's Date: 06/22/2017 OT Individual Time: 0700-0730 OT Individual Time Calculation (min): 30 min    Short Term Goals: Week 1:  OT Short Term Goal 1 (Week 1): STG=LTG  Skilled Therapeutic Interventions/Progress Updates:    1;1. No pain reported. BKI donned and locked in extension throughout session. Pt received with RN on toilet voiding bowel. Pt completes all steps of toileting with MIN A for balance. Pt ambulates to sink and stands with min A to shave, brush teeth, wash UB/peri area, and don pull over shirt. Pt uses reacher to thread BLE into pant legs with A for lifting BLE off recliner surface to pull pants up to hips. Pt leans laterally to advance pants up hips. Exited session with pt seated in w/c with call light in reach and all needs met.   Therapy Documentation Precautions:  Precautions Precautions: Fall, Knee Required Braces or Orthoses: Knee Immobilizer - Right, Knee Immobilizer - Left (BLEDSOE braces) Knee Immobilizer - Right: On at all times Knee Immobilizer - Left: On at all times Restrictions Weight Bearing Restrictions: Yes RLE Weight Bearing: Weight bearing as tolerated LLE Weight Bearing: Weight bearing as tolerated  See Function Navigator for Current Functional Status.   Therapy/Group: Individual Therapy  Tonny Branch 06/22/2017, 12:21 PM

## 2017-06-22 NOTE — Progress Notes (Signed)
Occupational Therapy Session Note  Patient Details  Name: Jose [name] MRN: [id number] Date of Birth: 26-Aug-1951  Today's Date: 06/22/2017 OT Individual Time: 0805-0905 OT Individual Time Calculation (min): 60 min    Short Term Goals: Week 1:  OT Short Term Goal 1 (Week 1): STG=LTG  Skilled Therapeutic Interventions/Progress Updates:    Pt seen this session to work on transitional movements of sit><stands, functional mobilty and activity tolerance.  Pt worked on sit to stands with modified step in approach as he had both Bledsoe braces on with S to RW. Pt was able to ambulate over 300 ft with RW with S.  Rested in arm chair and worked on arm pushups from arm chair with various speeds to change the intensity. Reviewed and practiced UE self stretching for shoulders and forearms.  Pt ambulated back to room and sat in recliner. Elevated legs. Pt in room with all needs met.  Therapy Documentation Precautions:  Precautions Precautions: Fall, Knee Required Braces or Orthoses: Knee Immobilizer - Right, Knee Immobilizer - Left (BLEDSOE braces) Knee Immobilizer - Right: On at all times Knee Immobilizer - Left: On at all times Restrictions Weight Bearing Restrictions: Yes RLE Weight Bearing: Weight bearing as tolerated LLE Weight Bearing: Weight bearing as tolerated       Pain: Pain Assessment Pain Assessment: 0-10 Pain Score: 1  Faces Pain Scale: Hurts a little bit Pain Type: Surgical pain Pain Location: Leg Pain Orientation: Right Pain Descriptors / Indicators: Aching Pain Onset: Gradual Patients Stated Pain Goal: 2 Pain Intervention(s): Medication (See eMAR);Cold applied;Emotional support;Distraction Multiple Pain Sites: No 2nd Pain Site Pain Score: 0 PAINAD (Pain Assessment in Advanced Dementia) Breathing: normal Negative Vocalization: none Facial Expression: smiling or inexpressive Body Language: relaxed Critical Care Pain Observation Tool (CPOT) Facial Expression:  Relaxed, neutral Body Movements: Absence of movements ADL: ADL ADL Comments: see functional navigator  See Function Navigator for Current Functional Status.   Therapy/Group: Individual Therapy  [place] 06/22/2017, 12:01 PM

## 2017-06-22 NOTE — Progress Notes (Signed)
[facility]     PROGRESS [name]  Subjective/Complaints:  Jose [name] seen sitting up in Jose [name] chair this AM.  Jose [name] slept very well overnight and feels energized this AM.    ROS: Denies CP, SOB, N/V/D.  Objective: Vital Signs: Blood pressure 117/76, pulse 64, temperature 98.2 F (36.8 C), temperature source Oral, resp. rate 14, height 6' (1.829 m), SpO2 92 %. No results found.  Recent Labs  06/20/17 0410  WBC 7.8  HGB 12.4*  HCT 37.4*  PLT 231    Recent Labs  06/20/17 0410  NA 140  K 3.8  CL 104  GLUCOSE 108*  BUN 12  CREATININE 1.00  CALCIUM 8.7*   CBG (last 3)  No results for input(s): GLUCAP in the last 72 hours.  Wt Readings from Last 3 Encounters:  06/16/17 89.4 kg (197 lb)  04/12/17 89.4 kg (197 lb)  03/01/17 93.4 kg (206 lb)    Physical Exam:  BP 117/76 (BP Location: Right Arm)   Pulse 64   Temp 98.2 F (36.8 C) (Oral)   Resp 14   Ht 6' (1.829 m)   SpO2 92%  Constitutional: Jose [name] appears well-developedand well-nourished. NAD. HENT: Normocephalicand atraumatic.  Eyes: EOMare normal. No discharge.  Cardiovascular: RRR. No JVD. Respiratory: Effort normal and breath sounds normal. GI: Soft. Bowel sounds are normal.   Musculoskeletal: Jose [name] exhibits edema BLE.  Neurological: Jose [name] is alert and oriented  Motor: 5/5 b/l UE, b/l ADF/PF B/l HF 4/5 (pain inhibition, improving) Skin: Bilateral knees with surgical dressing in place, C/d/i. Psychiatric: Jose [name] has a normal mood and affect. Jose [name] behavior is normal. Judgmentand thought contentnormal.    Assessment/Plan: 1. Functional deficits secondary to bilateral quad tendon ruptures which require 3+ hours per day of interdisciplinary therapy in a comprehensive inpatient rehab setting. Physiatrist is providing close team supervision and 24 hour management of active medical problems listed below. Physiatrist and rehab team continue to assess barriers to discharge/monitor patient progress toward  functional and medical goals.  Function:  Bathing Bathing position   Position: Shower  Bathing parts Body parts bathed by patient: Right arm, Left arm, Chest, Abdomen, Front perineal area, Buttocks, Right upper leg, Left upper leg Body parts bathed by helper: Right lower leg, Left lower leg, Back  Bathing assist Assist Level: Touching or steadying assistance(Jose [name] > 75%)      Upper Body Dressing/Undressing Upper body dressing   What is the patient wearing?: Pull over shirt/dress     Pull over shirt/dress - Perfomed by patient: Thread/unthread right sleeve, Thread/unthread left sleeve, Put head through opening, Pull shirt over trunk          Upper body assist Assist Level: Set up      Lower Body Dressing/Undressing Lower body dressing   What is the patient wearing?: Pants, Non-skid slipper socks     Pants- Performed by patient: Thread/unthread right pants leg, Thread/unthread left pants leg, Pull pants up/down (with reacher) Pants- Performed by helper: Thread/unthread right pants leg, Thread/unthread left pants leg   Non-skid slipper socks- Performed by helper: Don/doff right sock, Don/doff left sock                  Lower body assist Assist for lower body dressing: Touching or steadying assistance (Jose [name] > 75%)      Toileting Toileting   Toileting steps completed by patient: Adjust clothing prior to toileting, Performs perineal hygiene, Adjust clothing after toileting      Toileting assist Assist  level: Supervision or verbal cues   Transfers Chair/bed transfer   Chair/bed transfer method: Stand pivot Chair/bed transfer assist level: Supervision or verbal cues Chair/bed transfer assistive device: Medical sales representative     Max distance: 150 Assist level: Supervision or verbal cues   Wheelchair   Type: Manual Max wheelchair distance: 150 ft Assist Level: Supervision or verbal cues  Cognition Comprehension Comprehension assist level: Follows  complex conversation/direction with extra time/assistive device  Expression Expression assist level: Expresses complex ideas: With extra time/assistive device  Social Interaction Social Interaction assist level: Interacts appropriately with others with medication or extra time (anti-anxiety, antidepressant).  Problem Solving Problem solving assist level: Solves complex problems: With extra time  Memory Memory assist level: Recognizes or recalls 90% of the time/requires cueing < 10% of the time    Medical Problem List and Plan: 1. Functional and mobility deficitssecondary to bilateral quad tendon ruptures on 6/29  Cont CIR 2. DVT Prophylaxis/Anticoagulation: Pharmaceutical: Lovenox 3. Pain Management: will continue hydrocodone prn with robaxin for muscle spams. Encourage use of ice and heat.  4. Mood: LCSW to follow for evaluation and support.  5. Neuropsych: This patient iscapable of making decisions on hisown behalf. 6. Skin/Wound Care: monitor incisions daily for healing. Maintain adequate nutritional and hydration status.  7. Fluids/Electrolytes/Nutrition: monitor I/Os  BMP within acceptable range on 7/4 8. Constipation: Augment bowel program.   Bowel reg increased 9. GERD: on protonix.  10. H/o gout: controlled on allopurinol.  11. Ascending Aortic dilations/ Bicuspid aortic valve: on low dose BB 12. Hypoalbuminemia  Supplement initiated 7/4 13. ABLA  Hb 12.4 on 7/4  Cont to monitor 14. Sleep disturbance  Xanax per Jose [name]  Improved  LOS (Days) 3 A FACE TO FACE EVALUATION WAS PERFORMED  Akylah Hascall Lorie Phenix 06/22/2017 8:14 AM

## 2017-06-23 NOTE — Progress Notes (Signed)
[place] [facility]YSICAL MEDICINE & REHABILITATION     PROGRESS NOTE  Subjective/Complaints:  Overall feeling well. Able to sleep. Pain controlled  ROS: pt denies nausea, vomiting, diarrhea, cough, shortness of breath or chest pain   Objective: Vital Signs: Blood pressure (!) 103/56, pulse (!) 59, temperature 98 F (36.7 C), temperature source Oral, resp. rate 16, height 6' (1.829 m), SpO2 99 %. No results found. No results for input(s): WBC, HGB, HCT, PLT in the last 72 hours. No results for input(s): NA, K, CL, GLUCOSE, BUN, CREATININE, CALCIUM in the last 72 hours.  Invalid input(s): CO CBG (last 3)  No results for input(s): GLUCAP in the last 72 hours.  Wt Readings from Last 3 Encounters:  06/16/17 89.4 kg (197 lb)  04/12/17 89.4 kg (197 lb)  03/01/17 93.4 kg (206 lb)    Physical Exam:  BP (!) 103/56 (BP Location: Left Arm)   Pulse (!) 59   Temp 98 F (36.7 C) (Oral)   Resp 16   Ht 6' (1.829 m)   SpO2 99%  Constitutional: He appears well-developedand well-nourished. NAD. HENT: Normocephalicand atraumatic.  Eyes: EOMare normal. No discharge.  Cardiovascular: RRR. No JVD. Respiratory: Effort normal and breath sounds normal. GI: Soft. Bowel sounds are normal.   Musculoskeletal: He exhibits tr to 1+ edema BLE.  Neurological: He is alert and oriented  Motor: 5/5 b/l UE, b/l ADF/PF B/l HF 4/5 (pain inhibition, improving) Skin: Bilateral knees with surgical dressing in place, C/d/i. Psychiatric: He has a normal mood and affect. His behavior is normal. Judgmentand thought contentnormal.    Assessment/Plan: 1. Functional deficits secondary to bilateral quad tendon ruptures which require 3+ hours per day of interdisciplinary therapy in a comprehensive inpatient rehab setting. Physiatrist is providing close team supervision and 24 hour management of active medical problems listed below. Physiatrist and rehab team continue to assess barriers to discharge/monitor patient  progress toward functional and medical goals.  Function:  Bathing Bathing position   Position: Shower  Bathing parts Body parts bathed by patient: Right arm, Left arm, Chest, Abdomen, Front perineal area, Buttocks, Right upper leg, Left upper leg Body parts bathed by helper: Back  Bathing assist Assist Level: Touching or steadying assistance(Pt > 75%)      Upper Body Dressing/Undressing Upper body dressing   What is the patient wearing?: Pull over shirt/dress     Pull over shirt/dress - Perfomed by patient: Thread/unthread right sleeve, Thread/unthread left sleeve, Put head through opening, Pull shirt over trunk          Upper body assist Assist Level: Set up      Lower Body Dressing/Undressing Lower body dressing   What is the patient wearing?: Pants     Pants- Performed by patient: Thread/unthread right pants leg, Thread/unthread left pants leg, Pull pants up/down Pants- Performed by helper: Thread/unthread right pants leg, Thread/unthread left pants leg   Non-skid slipper socks- Performed by helper: Don/doff right sock, Don/doff left sock                  Lower body assist Assist for lower body dressing: Touching or steadying assistance (Pt > 75%)      Toileting Toileting   Toileting steps completed by patient: Adjust clothing prior to toileting, Performs perineal hygiene, Adjust clothing after toileting   Toileting Assistive Devices: Grab bar or rail  Toileting assist Assist level: Supervision or verbal cues   Transfers Chair/bed transfer   Chair/bed transfer method: Stand pivot Chair/bed transfer assist  level: Supervision or verbal cues Chair/bed transfer assistive device: Walker, Air cabin crew     Max distance: 250 Assist level: Supervision or verbal cues   Wheelchair   Type: Manual Max wheelchair distance: 200 Assist Level: Supervision or verbal cues  Cognition Comprehension Comprehension assist level: Follows complex  conversation/direction with no assist  Expression Expression assist level: Expresses complex ideas: With no assist  Social Interaction Social Interaction assist level: Interacts appropriately with others - No medications needed.  Problem Solving Problem solving assist level: Solves complex problems: With extra time  Memory Memory assist level: Complete Independence: No helper    Medical Problem List and Plan: 1. Functional and mobility deficitssecondary to bilateral quad tendon ruptures on 6/29  Cont CIR 2. DVT Prophylaxis/Anticoagulation: Pharmaceutical: Lovenox 3. Pain Management: will continue hydrocodone prn with robaxin for muscle spams. Encourage use of ice and heat.  4. Mood: LCSW to follow for evaluation and support.  5. Neuropsych: This patient iscapable of making decisions on hisown behalf. 6. Skin/Wound Care: monitor incisions daily for healing. Maintain adequate nutritional and hydration status.  7. Fluids/Electrolytes/Nutrition: monitor I/Os  BMP within acceptable range on 7/4 8. Constipation: Augment bowel program.   Bowel reg increased 9. GERD: on protonix.  10. H/o gout: controlled on allopurinol.  11. Ascending Aortic dilations/ Bicuspid aortic valve: on low dose BB 12. Hypoalbuminemia  Supplement initiated 7/4 13. ABLA  Hb 12.4 on 7/4  Cont to monitor 14. Sleep disturbance  Xanax per pt  Improved  LOS (Days) 4 A FACE TO FACE EVALUATION WAS PERFORMED  Jose [name] T 06/23/2017 9:33 AM

## 2017-06-24 ENCOUNTER — Inpatient Hospital Stay (HOSPITAL_COMMUNITY): Payer: Medicare Other

## 2017-06-24 ENCOUNTER — Inpatient Hospital Stay (HOSPITAL_COMMUNITY): Payer: Medicare Other | Admitting: Occupational Therapy

## 2017-06-24 NOTE — Progress Notes (Signed)
[place] PHYSICAL MEDICINE & REHABILITATION     PROGRESS NOTE  Subjective/Complaints:  No new issues. Anxious to get back to therapies today  ROS: pt denies nausea, vomiting, diarrhea, cough, shortness of breath or chest pain   Objective: Vital Signs: Blood pressure 123/75, pulse 60, temperature 97.9 F (36.6 C), temperature source Oral, resp. rate 16, height 6' (1.829 m), SpO2 100 %. No results found. No results for input(s): WBC, HGB, HCT, PLT in the last 72 hours. No results for input(s): NA, K, CL, GLUCOSE, BUN, CREATININE, CALCIUM in the last 72 hours.  Invalid input(s): CO CBG (last 3)  No results for input(s): GLUCAP in the last 72 hours.  Wt Readings from Last 3 Encounters:  06/16/17 89.4 kg (197 lb)  04/12/17 89.4 kg (197 lb)  03/01/17 93.4 kg (206 lb)    Physical Exam:  BP 123/75 (BP Location: Right Arm)   Pulse 60   Temp 97.9 F (36.6 C) (Oral)   Resp 16   Ht 6' (1.829 m)   SpO2 100%  Constitutional: He appears well-developedand well-nourished. NAD. HENT: Normocephalicand atraumatic.  Eyes: EOMare normal. No discharge.  Cardiovascular:RRR without murmur. No JVD . Respiratory: Effort normal and breath sounds normal. GI: Soft. Bowel sounds are normal.   Musculoskeletal: He exhibits tr to tr to 1+ edema BLE.  Neurological: He is alert and oriented  Motor: 5/5 b/l UE, b/l ADF/PF B/l HF 4/5 (pain inhibition, improving) Skin: Bilateral knees with surgical dressing in place--no drainage. C/d/i. Psychiatric: He has a normal mood and affect. His behavior is normal. Judgmentand thought contentnormal.    Assessment/Plan: 1. Functional deficits secondary to bilateral quad tendon ruptures which require 3+ hours per day of interdisciplinary therapy in a comprehensive inpatient rehab setting. Physiatrist is providing close team supervision and 24 hour management of active medical problems listed below. Physiatrist and rehab team continue to assess barriers to  discharge/monitor patient progress toward functional and medical goals.  Function:  Bathing Bathing position   Position: Shower  Bathing parts Body parts bathed by patient: Right arm, Left arm, Chest, Abdomen, Front perineal area, Buttocks, Right upper leg, Left upper leg Body parts bathed by helper: Back  Bathing assist Assist Level: Touching or steadying assistance(Pt > 75%)      Upper Body Dressing/Undressing Upper body dressing   What is the patient wearing?: Pull over shirt/dress     Pull over shirt/dress - Perfomed by patient: Thread/unthread right sleeve, Thread/unthread left sleeve, Put head through opening, Pull shirt over trunk          Upper body assist Assist Level: Set up      Lower Body Dressing/Undressing Lower body dressing   What is the patient wearing?: Pants     Pants- Performed by patient: Thread/unthread right pants leg, Thread/unthread left pants leg, Pull pants up/down Pants- Performed by helper: Thread/unthread right pants leg, Thread/unthread left pants leg   Non-skid slipper socks- Performed by helper: Don/doff right sock, Don/doff left sock                  Lower body assist Assist for lower body dressing: Touching or steadying assistance (Pt > 75%)      Toileting Toileting   Toileting steps completed by patient: Adjust clothing prior to toileting, Performs perineal hygiene, Adjust clothing after toileting   Toileting Assistive Devices: Grab bar or rail  Toileting assist Assist level: Supervision or verbal cues   Transfers Chair/bed transfer   Chair/bed transfer method: Stand pivot  Chair/bed transfer assist level: Supervision or verbal cues Chair/bed transfer assistive device: Walker, Air cabin crew     Max distance: 250 Assist level: Supervision or verbal cues   Wheelchair   Type: Manual Max wheelchair distance: 200 Assist Level: Supervision or verbal cues  Cognition Comprehension Comprehension  assist level: Follows complex conversation/direction with no assist  Expression Expression assist level: Expresses complex ideas: With no assist  Social Interaction Social Interaction assist level: Interacts appropriately with others - No medications needed.  Problem Solving Problem solving assist level: Solves complex problems: Recognizes & self-corrects  Memory Memory assist level: Complete Independence: No helper    Medical Problem List and Plan: 1. Functional and mobility deficitssecondary to bilateral quad tendon ruptures on 6/29  Cont CIR 2. DVT Prophylaxis/Anticoagulation: Pharmaceutical: Lovenox 3. Pain Management: will continue hydrocodone prn with robaxin for muscle spams. Encourage use of ice and heat.  4. Mood: LCSW to follow for evaluation and support.  5. Neuropsych: This patient iscapable of making decisions on hisown behalf. 6. Skin/Wound Care: monitor incisions daily for healing. Maintain adequate nutritional and hydration status.  7. Fluids/Electrolytes/Nutrition: monitor I/Os  BMP within acceptable range on 7/4 8. Constipation: Augment bowel program.   Bowel reg increased--moving bowels 9. GERD: on protonix.  10. H/o gout: controlled on allopurinol.  11. Ascending Aortic dilations/ Bicuspid aortic valve: on low dose BB 12. Hypoalbuminemia  Supplement initiated 7/4 13. ABLA  Hb 12.4 on 7/4  Cont to monitor 14. Sleep disturbance  Xanax per pt  Improved  LOS (Days) 5 A FACE TO FACE EVALUATION WAS PERFORMED  Machael Raine T 06/24/2017 7:38 AM

## 2017-06-24 NOTE — Progress Notes (Signed)
Occupational Therapy Session Note  Patient Details  Name: Jose [name] MRN: [id number] Date of Birth: May 29, 1951  Today's Date: 06/24/2017 OT Individual Time: 1655-3748 and 2707-8675 OT Individual Time Calculation (min): 85 min and 60 min   Short Term Goals: Week 1:  OT Short Term Goal 1 (Week 1): STG=LTG    Skilled Therapeutic Interventions/Progress Updates:    Tx focus on functional ambulation, adaptive dressing skills/AE proficiency, and standing endurance during meaningful tasks.   Pt greeted in recliner, agreeable to go outdoors for tx. Pt ambulating from room 528 ft towards [employer], escorted in w/c remainder of way. While outdoors, trained pt on use of shoe funnel for donning shoes. After therapist modeled technique with use of reacher as needed, pt able to demonstrate carryover of education. Pt tying laces/positioning tongue under laces prior to lowering shoe funnel with reacher to insert feet with instruction. Pt ambulating outdoors with RW, up/down ramp,  retrieving items off of ground with reacher for increasing balance demands and AE proficiency. Issued reacher bag. Stand<sit and sit<stand from low bench with close supervision and extra time! Pt then reported feeling motivated to ambulate back to unit. Pt making it halfway up ramp of [employer] (218 ft). He was then escorted remainder of way back to unit. Toilet transfer/toileting completed with supervision, and then pt returned to recliner. He was set up for lunch and left with all needs a time of departure.  2nd Session 1:1 tx (60 minutes) Tx focus on functional ambulation, activity tolerance, and UB strengthening.   Pt greeted in recliner, requesting to go outdoors again. Pt ambulating 881 ft with RW and supervision to outdoor settings (without rest breaks). UB strengthening while seated in supported chair in community, with theraband placed under LEs for bicep/tricep and LE strengthening. L UE strength limited due  to polio diagnosis at age 66. Significant deltoid atrophy palpated and observed with attempt at shoulder flexion exercises with this arm. Sit<stand from low metal chair completed with extra time and supervision. Pt ambulating on uneven surfaces, up/down inclines, and avoiding environmental barriers with RW 94 ft without rest. He then ambulated back to unit without rest breaks, 582 ft.   Total ambulation distance approx 757 ft (used foot measuring wheel)   Once in room, pt completed toilet transfer/toileting and oral care with supervision, ambulating with RW as needed.  He was left in recliner with all needs/ice packs at time of departure.     Therapy Documentation Precautions:  Precautions Precautions: Fall, Knee Required Braces or Orthoses: Knee Immobilizer - Right, Knee Immobilizer - Left Knee Immobilizer - Right: On at all times Knee Immobilizer - Left: On at all times Restrictions Weight Bearing Restrictions: Yes RLE Weight Bearing: Weight bearing as tolerated LLE Weight Bearing: Weight bearing as tolerated Pain: Pt reported pain to be manageable with rest breaks  Pain Assessment Pain Assessment: 0-10 Pain Score: 6  Pain Type: Surgical pain Pain Location: Knee Pain Orientation: Right;Left;Lateral ADL: ADL ADL Comments: see functional navigator :    See Function Navigator for Current Functional Status.   Therapy/Group: Individual Therapy  Jose [name] 06/24/2017, 12:45 PM

## 2017-06-24 NOTE — Progress Notes (Signed)
Physical Therapy Session Note  Patient Details  Name: Jose [name] MRN: [id number] Date of Birth: 1951/03/31  Today's Date: 06/24/2017 PT Individual Time: 0805-0900 PT Individual Time Calculation (min): 55 min   Short Term Goals: Week 1:  PT Short Term Goal 1 (Week 1): STG = LTG due to short ELOS.  Skilled Therapeutic Interventions/Progress Updates:    Pt c/o of some pain and states he hasn't received his medication yet today. PT treatment session focused on stair navigation strategy and ambulation for activity tolerance.   Pt sitting in recliner upon arrival and agreeable to PT treatment session. Pt performs sit to stands throughout session using B armrest and RW with supervision for safety. Pt ambulates with RW throughout session with supervision for safety. Pt requests to ascend/descend the 3in stairs prior to the 6in steps for confidence and does so using a SBQC on the L and uses the R HR with steadying assist. Pt ascends four 6in steps using B hand rails with a step to pattern and descends backwards using B UE support on R handrail and a step to pattern. Pt ambulates using RW to bathroom in room for a continent bowel and performs 3/3 toileting steps independently. Pt ascends/descends 3 steps in the stairwell using B UE support on the R hand rail using a step to pattern and going down backwards on the descent with min guard assist. Pt reports this method feels more steady compared to using and AD in one UE, but reports an increase in pain. Pt ambulates >364f with RW with supervision for for safety focusing on activity tolerance. PT discussed with pt a plan to have a family member bring the car that will be used at D/C to practice car transfers. Pt returned to room left in recliner with call bell in place and needs met.   Therapy Documentation Precautions:  Precautions Precautions: Fall, Knee Required Braces or Orthoses: Knee Immobilizer - Right, Knee Immobilizer - Left Knee Immobilizer -  Right: On at all times Knee Immobilizer - Left: On at all times Restrictions Weight Bearing Restrictions: Yes RLE Weight Bearing: Weight bearing as tolerated LLE Weight Bearing: Weight bearing as tolerated  See Function Navigator for Current Functional Status.   Therapy/Group: Individual Therapy  Stephan Draughn 06/24/2017, 10:26 AM

## 2017-06-25 ENCOUNTER — Inpatient Hospital Stay (HOSPITAL_COMMUNITY): Payer: Medicare Other

## 2017-06-25 ENCOUNTER — Telehealth (INDEPENDENT_AMBULATORY_CARE_PROVIDER_SITE_OTHER): Payer: Self-pay | Admitting: Orthopedic Surgery

## 2017-06-25 ENCOUNTER — Inpatient Hospital Stay (HOSPITAL_COMMUNITY): Payer: Medicare Other | Admitting: Occupational Therapy

## 2017-06-25 ENCOUNTER — Inpatient Hospital Stay (HOSPITAL_COMMUNITY): Payer: Medicare Other | Admitting: Physical Therapy

## 2017-06-25 DIAGNOSIS — B379 Candidiasis, unspecified: Secondary | ICD-10-CM

## 2017-06-25 MED ORDER — METHOCARBAMOL 500 MG PO TABS
1000.0000 mg | ORAL_TABLET | Freq: Four times a day (QID) | ORAL | Status: DC | PRN
  Administered 2017-06-25 – 2017-06-26 (×3): 1000 mg via ORAL
  Filled 2017-06-25 (×4): qty 2

## 2017-06-25 MED ORDER — NYSTATIN 100000 UNIT/GM EX CREA
TOPICAL_CREAM | Freq: Two times a day (BID) | CUTANEOUS | Status: DC
  Administered 2017-06-25 – 2017-06-26 (×4): via TOPICAL
  Filled 2017-06-25: qty 15

## 2017-06-25 NOTE — Progress Notes (Signed)
Social Work Patient ID: Jose [name], male   DOB: 28-Dec-1950, 66 y.o.   MRN: [id number]  Team feels pt will be ready for discharge on Wed, MD in agreement with this plan. Met with pt to discuss The plan and his discharge needs. He is in agreement with this plan. He would like to rent a recliner chair if able instead of purchasing one. Have made referral to [facility] for wheelchair, rolling walker and bedside commode Will look into if can rent recliner form them also. Home Health to be provided by [facility] also. Will work toward discharge on Wed. Pt feels he will be ready by then. Pam-PA aware of this plan also.

## 2017-06-25 NOTE — Progress Notes (Signed)
Occupational Therapy Session Note  Patient Details  Name: Jose [name] MRN: [id number] Date of Birth: 17-Feb-1951  Today's Date: 06/25/2017 OT Individual Time: 1500-1530 OT Individual Time Calculation (min): 30 min    Short Term Goals: Week 1:  OT Short Term Goal 1 (Week 1): STG=LTG  Skilled Therapeutic Interventions/Progress Updates:    Pt completed functional mobility around the unit with use of the RW and supervision.  He was able to ambulate to the ADL apartment as well and practice bed transfers, including folding down covers and putting them back over him after laying down.  Supervision for completion of task.  Provided education on positioning of walker toward the head of his bed in order to allow for less restriction with bringing LEs into the bed.  Pt ambulated to the dayroom and down the [employer] without rest after completion of bed mobility.  Returned to room and pt transferred to his recliner to conclude session.    Therapy Documentation Precautions:  Precautions Precautions: Fall, Knee Required Braces or Orthoses: Knee Immobilizer - Right, Knee Immobilizer - Left Knee Immobilizer - Right: On at all times Knee Immobilizer - Left: On at all times Restrictions Weight Bearing Restrictions: No RLE Weight Bearing: Weight bearing as tolerated LLE Weight Bearing: Weight bearing as tolerated  Pain: Pain Assessment Pain Assessment: 0-10 Pain Score: 6  Faces Pain Scale: Hurts even more Pain Type: Surgical pain Pain Location: Leg Pain Orientation: Right;Left Pain Descriptors / Indicators: Aching Pain Onset: With Activity Patients Stated Pain Goal: 1 Pain Intervention(s): Repositioned;Cold applied 2nd Pain Site Pain Type: Surgical pain Pain Descriptors / Indicators: Aching ADL: See Function Navigator for Current Functional Status.   Therapy/Group: Individual Therapy  Katherene Dinino OTR/L 06/25/2017, 3:55 PM

## 2017-06-25 NOTE — Progress Notes (Signed)
[place] PHYSICAL MEDICINE & REHABILITATION     PROGRESS NOTE  Subjective/Complaints:  Pt seen laying in bed this AM.  He slept well overnight and states he had a good weekend.  He does complain about a rash in his groin area and spasms at night.   ROS: Denies nausea, vomiting, diarrhea, shortness of breath or chest pain   Objective: Vital Signs: Blood pressure 121/73, pulse (!) 57, temperature 98 F (36.7 C), temperature source Oral, resp. rate 16, height 6' (1.829 m), SpO2 96 %. No results found. No results for input(s): WBC, HGB, HCT, PLT in the last 72 hours. No results for input(s): NA, K, CL, GLUCOSE, BUN, CREATININE, CALCIUM in the last 72 hours.  Invalid input(s): CO CBG (last 3)  No results for input(s): GLUCAP in the last 72 hours.  Wt Readings from Last 3 Encounters:  06/16/17 89.4 kg (197 lb)  04/12/17 89.4 kg (197 lb)  03/01/17 93.4 kg (206 lb)    Physical Exam:  BP 121/73 (BP Location: Left Arm)   Pulse (!) 57 Comment: rn notified  Temp 98 F (36.7 C) (Oral)   Resp 16   Ht 6' (1.829 m)   SpO2 96%  Constitutional: He appears well-developedand well-nourished. NAD. HENT: Normocephalicand atraumatic.  Eyes: EOMare normal. No discharge.  Cardiovascular: RRR. No JVD . Respiratory: Effort normal and breath sounds normal. GI: Soft. Bowel sounds are normal.   Musculoskeletal: He exhibits tr to tr to 1+ edema BLE.  Neurological: He is alert and oriented  Motor: 5/5 b/l UE, b/l ADF/PF B/l HF 4/5 (pain inhibition, improving) Skin: Bilateral knees c/d/i. Psychiatric: He has a normal mood and affect. His behavior is normal. Judgmentand thought contentnormal.    Assessment/Plan: 1. Functional deficits secondary to bilateral quad tendon ruptures which require 3+ hours per day of interdisciplinary therapy in a comprehensive inpatient rehab setting. Physiatrist is providing close team supervision and 24 hour management of active medical problems listed  below. Physiatrist and rehab team continue to assess barriers to discharge/monitor patient progress toward functional and medical goals.  Function:  Bathing Bathing position   Position: Shower  Bathing parts Body parts bathed by patient: Right arm, Left arm, Chest, Abdomen, Front perineal area, Buttocks, Right upper leg, Left upper leg Body parts bathed by helper: Back  Bathing assist Assist Level: Touching or steadying assistance(Pt > 75%)      Upper Body Dressing/Undressing Upper body dressing   What is the patient wearing?: Pull over shirt/dress     Pull over shirt/dress - Perfomed by patient: Thread/unthread right sleeve, Thread/unthread left sleeve, Put head through opening, Pull shirt over trunk          Upper body assist Assist Level: Set up      Lower Body Dressing/Undressing Lower body dressing   What is the patient wearing?: Pants     Pants- Performed by patient: Thread/unthread right pants leg, Thread/unthread left pants leg, Pull pants up/down Pants- Performed by helper: Thread/unthread right pants leg, Thread/unthread left pants leg   Non-skid slipper socks- Performed by helper: Don/doff right sock, Don/doff left sock                  Lower body assist Assist for lower body dressing: Touching or steadying assistance (Pt > 75%)      Toileting Toileting   Toileting steps completed by patient: Adjust clothing prior to toileting, Performs perineal hygiene, Adjust clothing after toileting   Toileting Assistive Devices: Grab bar or rail  Toileting assist Assist level: Supervision or verbal cues   Transfers Chair/bed transfer   Chair/bed transfer method: Ambulatory, Stand pivot Chair/bed transfer assist level: Supervision or verbal cues Chair/bed transfer assistive device: Walker, Air cabin crew     Max distance: >322ft Assist level: Supervision or verbal cues   Wheelchair   Type: Manual Max wheelchair distance: 200 Assist  Level: Supervision or verbal cues  Cognition Comprehension Comprehension assist level: Follows complex conversation/direction with no assist  Expression Expression assist level: Expresses complex ideas: With no assist  Social Interaction Social Interaction assist level: Interacts appropriately with others - No medications needed.  Problem Solving Problem solving assist level: Solves complex problems: Recognizes & self-corrects  Memory Memory assist level: Complete Independence: No helper    Medical Problem List and Plan: 1. Functional and mobility deficitssecondary to bilateral quad tendon ruptures on 6/29  Cont CIR 2. DVT Prophylaxis/Anticoagulation: Pharmaceutical: Lovenox 3. Pain Management: hydrocodone prn   robaxin for muscle spasms, increased on 7/9  Encourage use of ice and heat.  4. Mood: LCSW to follow for evaluation and support.  5. Neuropsych: This patient iscapable of making decisions on hisown behalf. 6. Skin/Wound Care: monitor incisions daily for healing. Maintain adequate nutritional and hydration status.  7. Fluids/Electrolytes/Nutrition: monitor I/Os  BMP within acceptable range on 7/4 8. Constipation: Augment bowel program.   Bowel reg increased  Improving 9. GERD: on protonix.  10. H/o gout: controlled on allopurinol.  11. Ascending Aortic dilations/ Bicuspid aortic valve: on low dose BB 12. Hypoalbuminemia  Supplement initiated 7/4 13. ABLA  Hb 12.4 on 7/4  Cont to monitor 14. Sleep disturbance  Xanax per pt  Improved 15. Candidiasis  Nystatin ordered  LOS (Days) 6 A FACE TO FACE EVALUATION WAS PERFORMED  Brycelyn Gambino Lorie Phenix 06/25/2017 8:44 AM

## 2017-06-25 NOTE — Progress Notes (Addendum)
Physical Therapy Session Note  Patient Details  Name: Jose [name] MRN: [id number] Date of Birth: 27-Sep-1951  Today's Date: 06/25/2017 PT Individual Time: 1000-1100, 1335-1415 PT Individual Time Calculation (min): 60 min, 40 min  Short Term Goals: Week 1:  PT Short Term Goal 1 (Week 1): STG = LTG due to short ELOS.  Skilled Therapeutic Interventions/Progress Updates:     Session 1: 1000-1100 Pt sitting in recliner upon PT arrival, agreeable to therapy session. Pt transferred from sitting to standing with supervision and RW. Pt ambulated >300 ft with RW and supervision for balance. Pt ambulated outside on uneven surfaces x 50 ft with RW and supervision for balance. PT ascended/descended 17 steps using R handrail and step to pattern. Pt ascended forwards and descended the steps going backwards, leading with the L LE. Pt ambulated >300 ft using RW and supervision back to the gym. Sitting in chair, pt performed seated straight leg raise exercises 2 x 10 per leg for LE strengthening. In standing, pt performed hip abduction, hip flexion and hip extension 2 x 10 for LE strengthening. Pt ambulated back to room and left in recliner with call bell in reach. Pt reported pain 4/10 after stair navigation, requested pain meds from RN.   Session 2: [id number] Pt sitting in recliner upon PT arrival, agreeable to therapy tx. Pt transferred from recliner to standing with supervision and RW. Pt ambulated to sink in order to brush teeth, working on dynamic standing balance without UE support. Pt ambulated to the bathroom, worked on dynamic standing balance to don/doff shorts and wash hands. Session focused on ambulation and LE weight bearing tolerance. Pt ambulated >359ft using RW and supervision for balance, ambulating on an incline/decline, navigating uneven surfaces, and ascending/descending a curb. Pt left in recliner in room with call bell in reach. Pt denies pain this session.    Therapy  Documentation Precautions:  Precautions Precautions: Fall, Knee Required Braces or Orthoses: Knee Immobilizer - Right, Knee Immobilizer - Left Knee Immobilizer - Right: On at all times Knee Immobilizer - Left: On at all times Restrictions Weight Bearing Restrictions: Yes RLE Weight Bearing: Weight bearing as tolerated LLE Weight Bearing: Weight bearing as tolerated      See Function Navigator for Current Functional Status.   Therapy/Group: Individual Therapy  Netta Corrigan, PT, DPT 06/25/2017, 10:21 AM

## 2017-06-25 NOTE — Telephone Encounter (Signed)
Patient called advised he is going to be released Wednesday and want to know if Dr Marlou Sa can come by to see him before he is discharged. Patient want to know if he is going to be set up for (PT) and what medicines he will be on when he is discharged. Patient said he may have a few other questions to ask Dr Marlou Sa. The number to contact patient is [phone]

## 2017-06-25 NOTE — Telephone Encounter (Signed)
Please advise. Thanks.  

## 2017-06-25 NOTE — Progress Notes (Signed)
Occupational Therapy Session Note  Patient Details  Name: Jose [name] MRN: [id number] Date of Birth: 1951/11/05  Today's Date: 06/25/2017 OT Individual Time: 0900-1002 OT Individual Time Calculation (min): 62 min    Short Term Goals: Week 1:  OT Short Term Goal 1 (Week 1): STG=LTG  Skilled Therapeutic Interventions/Progress Updates:    Pt completed shower and dressing during session.  Supervision for all aspects of task.  He was able to ambulate in and out of the shower with supervision using the RW for support.  Therapist applied plastic bags over BLEs and braces secondary to not being able to remove the braces currently for shower.  Utilized reacher and shoe funnel for LB dressing.  Pt tied his shoes and applied before transfer from the tub bench.  He donned pullover shirt in sitting as well as threading shorts over his feet with use of the reacher before standing to pull them up over his bottom.  Completed session by having pt use the RW to gather used towels and washcloths and then place in dirty laundry.  Finished session with pt in bedside recliner and call button in reach.    Therapy Documentation Precautions:  Precautions Precautions: Fall, Knee Required Braces or Orthoses: Knee Immobilizer - Right, Knee Immobilizer - Left Knee Immobilizer - Right: On at all times Knee Immobilizer - Left: On at all times Restrictions Weight Bearing Restrictions: No RLE Weight Bearing: Weight bearing as tolerated LLE Weight Bearing: Weight bearing as tolerated  Pain: Pain Assessment Pain Assessment: Faces Pain Score: 1  Faces Pain Scale: Hurts a little bit Pain Type: Surgical pain Pain Location: Knee Pain Orientation: Right;Left Pain Descriptors / Indicators: Discomfort Pain Onset: With Activity PAINAD (Pain Assessment in Advanced Dementia) Breathing: normal Negative Vocalization: none Facial Expression: smiling or inexpressive Body Language: relaxed Critical Care Pain Observation  Tool (CPOT) Facial Expression: Relaxed, neutral ADL: See Function Navigator for Current Functional Status.   Therapy/Group: Individual Therapy  Omair Dettmer OTR/L 06/25/2017, 12:21 PM

## 2017-06-26 ENCOUNTER — Inpatient Hospital Stay (HOSPITAL_COMMUNITY): Payer: Medicare Other

## 2017-06-26 ENCOUNTER — Inpatient Hospital Stay (HOSPITAL_COMMUNITY): Payer: Medicare Other | Admitting: Occupational Therapy

## 2017-06-26 LAB — BASIC METABOLIC PANEL
ANION GAP: 7 (ref 5–15)
BUN: 21 mg/dL — ABNORMAL HIGH (ref 6–20)
CO2: 26 mmol/L (ref 22–32)
Calcium: 8.8 mg/dL — ABNORMAL LOW (ref 8.9–10.3)
Chloride: 106 mmol/L (ref 101–111)
Creatinine, Ser: 0.99 mg/dL (ref 0.61–1.24)
GFR calc Af Amer: >60 mL/min (ref >60)
Glucose, Bld: 95 mg/dL (ref 65–99)
POTASSIUM: 3.8 mmol/L (ref 3.5–5.1)
Sodium: 139 mmol/L (ref 135–145)

## 2017-06-26 LAB — CBC
HEMATOCRIT: 38 % — AB (ref 39.0–52.0)
HEMOGLOBIN: 12.4 g/dL — AB (ref 13.0–17.0)
MCH: 31.4 pg (ref 26.0–34.0)
MCHC: 32.6 g/dL (ref 30.0–36.0)
MCV: 96.2 fL (ref 78.0–100.0)
Platelets: 286 10*3/uL (ref 150–400)
RBC: 3.95 MIL/uL — ABNORMAL LOW (ref 4.22–5.81)
RDW: 13.2 % (ref 11.5–15.5)
WBC: 7.2 10*3/uL (ref 4.0–10.5)

## 2017-06-26 MED ORDER — ASPIRIN 325 MG PO TABS: 325.0000 mg | ORAL_TABLET | Freq: Every day | ORAL | Status: DC

## 2017-06-26 NOTE — Progress Notes (Signed)
Social Work  Discharge Note  The overall goal for the admission was met for:   Discharge location: Yes-HOME WITH FRIEND'S ASSISTING HIM  Length of Stay: Yes-8 DAYS  Discharge activity level: Yes-SUPERVISION-MOD/I LEVEL  Home/community participation: Yes  Services provided included: MD, RD, PT, OT, RN, CM, Pharmacy and SW  Financial Services: Medicare and Private Insurance: [employer]  Follow-up services arranged: Home Health: [facility]-PT,OT,RN, DME: [facility]-WHEELCHAIR, ROLLING WALKER, BEDSIDE COMMODE, TUB BENCH AND GERI CHAIR and Patient/Family has no preference for HH/DME agencies  Comments (or additional information):PT DID WELL AND REACHED MOD/I-SUPERVISION LEVEL GOALS. PLANS TO GO TO HIS HOME AND FRIEND'S HOME WHERE SOMEONE WILL BE THERE WITH HIM.  Patient/Family verbalized understanding of follow-up arrangements: Yes  Individual responsible for coordination of the follow-up plan: SELF  Confirmed correct DME delivered: [name] 06/26/2017    [name]

## 2017-06-26 NOTE — Progress Notes (Signed)
Occupational Therapy Session Note  Patient Details  Name: Jose [name] MRN: [id number] Date of Birth: 04/08/1951  Today's Date: 06/26/2017 OT Individual Time: 1130-1200 OT Individual Time Calculation (min): 30 min    Short Term Goals: Week 1:  OT Short Term Goal 1 (Week 1): STG=LTG  Skilled Therapeutic Interventions/Progress Updates:    1:1 Pt's d/c equipment adjusted for him including walker and w/c. Practiced maneuvering w/c around and operating LE rests. Pt self propelled from room to [place] tower and then returned by walking with RW with distant supervision to mod I . Left in recliner to rest. Pt able to doff shoes himself.   Therapy Documentation Precautions:  Precautions Precautions: Fall, Knee Required Braces or Orthoses: Knee Immobilizer - Right, Knee Immobilizer - Left Knee Immobilizer - Right: On at all times Knee Immobilizer - Left: On at all times Restrictions Weight Bearing Restrictions: Yes RLE Weight Bearing: Weight bearing as tolerated LLE Weight Bearing: Weight bearing as tolerated Other Position/Activity Restrictions: in knee immobilizers Pain:  no c/o pain ADL: ADL ADL Comments: see functional navigator  See Function Navigator for Current Functional Status.   Therapy/Group: Individual Therapy  Willeen [facility] 06/26/2017, 1:37 PM

## 2017-06-26 NOTE — Progress Notes (Signed)
[place] PHYSICAL MEDICINE & REHABILITATION     PROGRESS NOTE  Subjective/Complaints:  Pt seen sitting up in his chair this AM.  He states he slept the best he has slept yet.  He also notes that he was able to climb 15 stairs yesterday.   ROS: Denies nausea, vomiting, diarrhea, shortness of breath or chest pain   Objective: Vital Signs: Blood pressure 134/80, pulse 60, temperature 97.8 F (36.6 C), temperature source Oral, resp. rate 18, height 6' (1.829 m), SpO2 97 %. No results found.  Recent Labs  06/26/17 0445  WBC 7.2  HGB 12.4*  HCT 38.0*  PLT 286    Recent Labs  06/26/17 0445  NA 139  K 3.8  CL 106  GLUCOSE 95  BUN 21*  CREATININE 0.99  CALCIUM 8.8*   CBG (last 3)  No results for input(s): GLUCAP in the last 72 hours.  Wt Readings from Last 3 Encounters:  06/16/17 89.4 kg (197 lb)  04/12/17 89.4 kg (197 lb)  03/01/17 93.4 kg (206 lb)    Physical Exam:  BP 134/80 (BP Location: Left Arm)   Pulse 60   Temp 97.8 F (36.6 C) (Oral)   Resp 18   Ht 6' (1.829 m)   SpO2 97%  Constitutional: He appears well-developedand well-nourished. NAD. HENT: Normocephalicand atraumatic.  Eyes: EOMare normal. No discharge.  Cardiovascular: RRR. No JVD . Respiratory: Effort normal and breath sounds normal. GI: Soft. Bowel sounds are normal.   Musculoskeletal: He exhibits tr to tr to 1+ edema BLE.  Neurological: He is alert and oriented  Motor: 5/5 b/l UE, b/l ADF/PF B/l HF 4-4+/5 (pain inhibition, improving) Skin: Bilateral knees c/d/i. Psychiatric: He has a normal mood and affect. His behavior is normal. Judgmentand thought contentnormal.    Assessment/Plan: 1. Functional deficits secondary to bilateral quad tendon ruptures which require 3+ hours per day of interdisciplinary therapy in a comprehensive inpatient rehab setting. Physiatrist is providing close team supervision and 24 hour management of active medical problems listed below. Physiatrist and rehab  team continue to assess barriers to discharge/monitor patient progress toward functional and medical goals.  Function:  Bathing Bathing position   Position: Shower  Bathing parts Body parts bathed by patient: Right arm, Left arm, Chest, Abdomen, Front perineal area, Buttocks, Right upper leg, Left upper leg, Back Body parts bathed by helper: Right lower leg, Left lower leg  Bathing assist Assist Level: Supervision or verbal cues      Upper Body Dressing/Undressing Upper body dressing   What is the patient wearing?: Pull over shirt/dress     Pull over shirt/dress - Perfomed by patient: Thread/unthread right sleeve, Thread/unthread left sleeve, Put head through opening, Pull shirt over trunk          Upper body assist Assist Level: Set up      Lower Body Dressing/Undressing Lower body dressing   What is the patient wearing?: Pants, Shoes     Pants- Performed by patient: Thread/unthread right pants leg, Thread/unthread left pants leg, Pull pants up/down Pants- Performed by helper: Thread/unthread right pants leg, Thread/unthread left pants leg   Non-skid slipper socks- Performed by helper: Don/doff right sock, Don/doff left sock     Shoes - Performed by patient: Don/doff right shoe, Don/doff left shoe, Fasten right, Fasten left            Lower body assist Assist for lower body dressing: Supervision or verbal cues      Child psychotherapist  steps completed by patient: Adjust clothing prior to toileting, Performs perineal hygiene, Adjust clothing after toileting   Toileting Assistive Devices: Grab bar or rail  Toileting assist Assist level: Supervision or verbal cues   Transfers Chair/bed transfer   Chair/bed transfer method: Ambulatory, Stand pivot Chair/bed transfer assist level: Supervision or verbal cues Chair/bed transfer assistive device: Walker, Air cabin crew     Max distance: >355ft Assist level: Supervision or verbal  cues   Wheelchair   Type: Manual Max wheelchair distance: 200 Assist Level: Supervision or verbal cues  Cognition Comprehension Comprehension assist level: Follows complex conversation/direction with no assist  Expression Expression assist level: Expresses complex ideas: With no assist  Social Interaction Social Interaction assist level: Interacts appropriately with others - No medications needed.  Problem Solving Problem solving assist level: Solves complex problems: Recognizes & self-corrects  Memory Memory assist level: Complete Independence: No helper    Medical Problem List and Plan: 1. Functional and mobility deficitssecondary to bilateral quad tendon ruptures on 6/29  Cont CIR, plan for d/c tomorrow 2. DVT Prophylaxis/Anticoagulation: Pharmaceutical: Lovenox 3. Pain Management: hydrocodone prn   Robaxin for muscle spasms, increased on 7/9  Encourage use of ice and heat.  4. Mood: LCSW to follow for evaluation and support.  5. Neuropsych: This patient iscapable of making decisions on hisown behalf. 6. Skin/Wound Care: monitor incisions daily for healing. Maintain adequate nutritional and hydration status.  7. Fluids/Electrolytes/Nutrition: monitor I/Os  BMP within acceptable range on 7/10 8. Constipation: Augment bowel program.   Bowel reg increased  Improving 9. GERD: on protonix.  10. H/o gout: controlled on allopurinol.  11. Ascending Aortic dilations/ Bicuspid aortic valve: on low dose BB 12. Hypoalbuminemia  Supplement initiated 7/4 13. ABLA  Hb 12.4 on 7/10  Cont to monitor 14. Sleep disturbance  Xanax per pt  Improved 15. Candidiasis  Nystatin ordered  LOS (Days) 7 A FACE TO FACE EVALUATION WAS PERFORMED  Demetria Lightsey Lorie Phenix 06/26/2017 8:26 AM

## 2017-06-26 NOTE — Progress Notes (Signed)
Physical Therapy Session Note  Patient Details  Name: Jose [name] MRN: [id number] Date of Birth: 05-22-1951  Today's Date: 06/26/2017 PT Individual Time: 1300-1330 PT Individual Time Calculation (min): 30 min   Short Term Goals: Week 1:  PT Short Term Goal 1 (Week 1): STG = LTG due to short ELOS.  Skilled Therapeutic Interventions/Progress Updates:    Session focused on functional gait and endurance on unit and outside over uneven surfaces to simulate community mobility > 2000'. Education on overall activity progress, energy conservation, and d/c planning. Pt mod I with RW for transfers and gait.   Therapy Documentation Precautions:  Precautions Precautions: Fall, Knee Required Braces or Orthoses: Knee Immobilizer - Right, Knee Immobilizer - Left Knee Immobilizer - Right: On at all times Knee Immobilizer - Left: On at all times Restrictions Weight Bearing Restrictions: Yes RLE Weight Bearing: Weight bearing as tolerated LLE Weight Bearing: Weight bearing as tolerated Other Position/Activity Restrictions: in knee immobilizers  Pain:  No complaints.   See Function Navigator for Current Functional Status.   Therapy/Group: Individual Therapy  Canary Brim Ivory Broad, PT, DPT  06/26/2017, 1:34 PM

## 2017-06-26 NOTE — Discharge Summary (Signed)
Physician Discharge Summary  Patient ID: Jose [name] MRN: [id number] DOB/AGE: [birth year] 66 y.o.  Admit date: 06/19/2017 Discharge date: 06/27/2017  Discharge Diagnoses:  Principal Problem:   Quadriceps tendon rupture, left, sequela Active Problems:   Rupture of quadriceps tendon, right, sequela   Acute blood loss anemia   Hypoalbuminemia due to protein-calorie malnutrition (HCC)   History of gout   Sleep disturbance   Constipation   Abnormality of gait   Candidiasis   Discharged Condition: stable   Significant Diagnostic Studies:   Labs:  Basic Metabolic Panel: BMP Latest Ref Rng & Units 06/26/2017 06/20/2017 06/15/2017  Glucose 65 - 99 mg/dL 95 108(H) 99  BUN 6 - 20 mg/dL 21(H) 12 11  Creatinine 0.61 - 1.24 mg/dL 0.99 1.00 1.01  Sodium 135 - 145 mmol/L 139 140 139  Potassium 3.5 - 5.1 mmol/L 3.8 3.8 3.5  Chloride 101 - 111 mmol/L 106 104 104  CO2 22 - 32 mmol/L 26 28 25   Calcium 8.9 - 10.3 mg/dL 8.8(L) 8.7(L) 9.2    CBC: CBC Latest Ref Rng & Units 06/26/2017 06/20/2017 06/15/2017  WBC 4.0 - 10.5 K/uL 7.2 7.8 9.3  Hemoglobin 13.0 - 17.0 g/dL 12.4(L) 12.4(L) 15.3  Hematocrit 39.0 - 52.0 % 38.0(L) 37.4(L) 45.2  Platelets 150 - 400 K/uL 286 231 191    CBG: No results for input(s): GLUCAP in the last 168 hours.  Brief HPI:   Jose [name] a 66 y.o.malewith history of SDH, OA bilateral knees, history offalls, partial quad rupture on left but functional. He was admitted on 06/15/17 with recurrent fall with pain and inability to extend either knee due to bilateral quad tendon rupture. He underwent quad tendon repair by Dr. Marlou Sa and is to wear B-KI at all times. Therapy evaluations done and patient with deficits in mobility and ability to carry out ADL tasks. CIR recommended for follow up therapy   Hospital Course: Jose [name] was admitted to rehab 06/19/2017 for inpatient therapies to consist of PT  and OT at least three hours five days a week. Past admission  physiatrist, therapy team and rehab RN have worked together to provide customized collaborative inpatient rehab.  He was maintained Lovenox for DVT prophylaxis. BLE dopplers were negative for DVT.  Protein supplement was added due to hypoalbuminemia  and to help with healing. CBC done at admission revealed  ABLA which is stable on recheck.  He was started on bowel program due to constipation and this was augmented with good results. Follow up lytes shows evidence of dehydration and he was encouraged to push fluids. He has had issues with muscle spasms and robaxin was increased to help with symptoms. Insomnia has been managed with use of xanax at night to help manage anxiety. Pain is controlled with use of hydrocodone or tramadol and he has been educated on tapering use of medications after discharge. Bilateral knee incision are C/D/I without signs of infection. He has made great progress during his rehab stay and is modified independent for mobility. He will continue to receive follow up HHPT and [place] after [place] after discharge.    Rehab course: During patient's stay in rehab weekly team conferences were held to monitor patient's progress, set goals and discuss barriers to discharge. At admission, patient required min assist with mobility and mod assist with basic self-care tasks.   He has had improvement in activity tolerance, balance, postural control, as well as ability to compensate for deficits.  He is able  to complete ADL tasks with supervision.  He is able to perform transfers at modified independent level and to  to ambulate 300' X 2 with RW. He is able to and is able to climb 15 stairs with bilateral rails and supervision.     Disposition: 01-Home or Self Care  Diet: Regular.   Special Instructions: 1. Keep immobilizers on at all times. May loosen when in bed with legs supported. 2. Continue ASA 325 mg daily for at least a month to prevent blood clots.  3. Use Ice after therapy  and 2-3 X day for pain control.    Discharge Instructions    Ambulatory referral to Physical Medicine Rehab    Complete by:  As directed    3-4 week follow up with Dr. Posey Pronto     Allergies as of 06/27/2017   No Known Allergies     Medication List    STOP taking these medications   oxyCODONE 5 MG immediate release tablet Commonly known as:  Oxy IR/ROXICODONE     TAKE these medications   acetaminophen 500 MG tablet Commonly known as:  TYLENOL Take 500-1,000 mg by mouth every 6 (six) hours as needed for mild pain.   allopurinol 300 MG tablet Commonly known as:  ZYLOPRIM TAKE 1 TABLET BY MOUTH EVERY DAY   aspirin 325 MG tablet Take 1 tablet (325 mg total) by mouth daily.   colchicine 0.6 MG tablet Take 1 tablet (0.6 mg total) by mouth daily as needed (FOR GOUT FLAREUPS).   esomeprazole 40 MG capsule Commonly known as:  NEXIUM Take 1 capsule (40 mg total) by mouth daily.   HYDROcodone-acetaminophen 5-325 MG tablet--Rx # 6 pills Commonly known as:  NORCO Take 1 tablet by mouth every 12 (twelve) hours as needed (breakthrough pain).   methocarbamol 500 MG tablet Commonly known as:  ROBAXIN Take 1-2 tablets (500-1,000 mg total) by mouth every 6 (six) hours as needed for muscle spasms. What changed:  how much to take   metoprolol succinate 25 MG 24 hr tablet Commonly known as:  TOPROL-XL Take 1 tablet (25 mg total) by mouth daily. Take with or immediately following a meal.   multivitamin with minerals Tabs tablet Take 1 tablet by mouth daily.   senna-docusate 8.6-50 MG tablet Commonly known as:  Senokot-S Take 2 tablets by mouth at bedtime.   traMADol 50 MG tablet Commonly known as:  ULTRAM Take 1 tablet (50 mg total) by mouth every 6 (six) hours as needed for severe pain.      Follow-up Information    Jamse Arn, MD Follow up.   Specialty:  Physical Medicine and Rehabilitation Why:  Office will call you with follow up appointment Contact  information: [address] [place] [address] [phone]        Meredith Pel, MD. Call on 06/29/2017.   Specialty:  Orthopedic Surgery Why:  Be there at 8:30 am for follow appointment Contact information: [place] [address] [phone]           Signed: Bary Leriche 06/27/2017, 2:49 PM

## 2017-06-26 NOTE — Progress Notes (Signed)
Occupational Therapy Discharge Summary  Patient Details  Name: Jose [name] MRN: [id number] Date of Birth: 1951/03/09  Today's Date: 06/26/2017 OT Individual Time: 1415-1530 OT Individual Time Calculation (min): 75 min   Session Note:  Pt completed bathing and dressing during session. Modified independent for ambulation to the toilet and for urinating in standing with UE support. Supervision for applying bags over BLEs and [place]e braces.  He was able to bathe sitting on shower bench with supervision and lateral leans for washing peri area.  Dressing sit to stand from the bedside recliner.  He was able to use the reacher only for donning all LB clothing and did not need use of the shoe funnel.  Once finished he ambulated to the sink with modified independence for brushing his teeth and completing other grooming tasks.  He next completed functional mobility from his room to outside of the [place] tower before needing a rest break.  After 4-5 mins rest he ambulated back to his room with modified independence.  Pt left in recliner with walker in reach.  Pt modified independent in room at this time.    Patient has met 7 of 7 long term goals due to improved balance and ability to compensate for deficits.  Patient to discharge at overall Modified Independent level.   Reasons goals not met: NA  Recommendation:  Patient will benefit from ongoing skilled OT services in home health setting to continue to advance functional skills in the area of BADL, iADL and Vocation.  Feel pt will benefit from [facility] eval for safety and ongoing compensation techniques for selfcare tasks and home management in familiar setting since pt will be alone most of the time.    Equipment: tub bench, BSC  Reasons for discharge: treatment goals met and discharge from hospital  Patient/family agrees with progress made and goals achieved: Yes  OT Discharge Precautions/Restrictions  Precautions Precautions: Fall;Knee Required  Braces or Orthoses: Knee Immobilizer - Right;Knee Immobilizer - Left Knee Immobilizer - Right: On at all times Knee Immobilizer - Left: On at all times Restrictions Weight Bearing Restrictions: No RLE Weight Bearing: Weight bearing as tolerated LLE Weight Bearing: Weight bearing as tolerated  Pain Pain Assessment Pain Assessment: Faces Faces Pain Scale: Hurts a little bit Pain Type: Surgical pain Pain Location: Leg Pain Orientation: Right;Left Pain Descriptors / Indicators: Discomfort Pain Onset: With Activity Pain Intervention(s): Medication (See eMAR);Repositioned ADL ADL ADL Comments: see functional navigator Vision Baseline Vision/History: Wears glasses Wears Glasses: Reading only Patient Visual Report: No change from baseline Vision Assessment?: No apparent visual deficits Perception  Perception: Within Functional Limits Praxis Praxis: Intact Cognition Overall Cognitive Status: Within Functional Limits for tasks assessed Arousal/Alertness: Awake/alert Orientation Level: Oriented X4 Attention: Focused;Sustained;Selective Focused Attention: Appears intact Sustained Attention: Appears intact Selective Attention: Appears intact Memory: Appears intact Awareness: Appears intact Problem Solving: Appears intact Safety/Judgment: Appears intact Sensation Sensation Light Touch: Appears Intact Stereognosis: Appears Intact Hot/Cold: Appears Intact Proprioception: Appears Intact Additional Comments: Sensation intact in BUEs Coordination Gross Motor Movements are Fluid and Coordinated: Yes (In BUEs) Fine Motor Movements are Fluid and Coordinated: Yes Motor  Motor Motor: Within Functional Limits Mobility  Transfers Transfers: Sit to Stand;Stand to Sit Sit to Stand: 6: Modified independent (Device/Increase time);With upper extremity assist;From bed;From toilet Stand to Sit: 6: Modified independent (Device/Increase time);With upper extremity assist;To bed;To toilet   Trunk/Postural Assessment  Cervical Assessment Cervical Assessment: Within Functional Limits Thoracic Assessment Thoracic Assessment: Within Functional Limits Lumbar Assessment Lumbar Assessment: Within Functional  Limits Postural Control Postural Control: Within Functional Limits  Balance Balance Balance Assessed: Yes Dynamic Sitting Balance Dynamic Sitting - Balance Support: During functional activity Dynamic Sitting - Level of Assistance: 6: Modified independent (Device/Increase time) Static Standing Balance Static Standing - Balance Support: During functional activity Static Standing - Level of Assistance: 6: Modified independent (Device/Increase time) Dynamic Standing Balance Dynamic Standing - Balance Support: Bilateral upper extremity supported;During functional activity Dynamic Standing - Level of Assistance: 6: Modified independent (Device/Increase time) Extremity/Trunk Assessment RUE Assessment RUE Assessment: Within Functional Limits LUE Assessment LUE Assessment: Within Functional Limits   See Function Navigator for Current Functional Status.  Thien Berka OTR/L 06/26/2017, 4:37 PM

## 2017-06-26 NOTE — Progress Notes (Signed)
Physical Therapy Discharge Summary  Patient Details  Name: Jose [name] MRN: [id number] Date of Birth: 03-14-1951  Today's Date: 06/26/2017 PT Individual Time: 1000-1100 PT Individual Time Calculation (min): 60 min   Therapy interventions: Pt sitting in recliner upon PT arrival, agreeable to therapy tx. Pt ambulated 148f using RW Mod I to the gym. Pt performedbed transfer Mod I. Pt performed car transfer with supervision, pt used RW to get in discussed sitting in the back seat vs front seat in order to scoot across seat to keep legs straight across back seat. Worked on stair navigation, pt ascended/descended 17 steps with supervision for balance and safety. Pt worked on [employer]tolerance and endurance, ambulated >3081fx 2 throughout session, ambulated on uneven surfaces, incline/decline Mod I. Worked on standing balance, standing on foam with eyes open and no UE support 2 x 30 sec, standing on foam with eyes closed 2 x 30 sec. Pt reports no further questions or concerns about going home tomorrow. Discussed the importance of always having someone with him when navigating steps for safety reasons and to carry his RW. Pt left seated in w/c with call bell in reach.    Patient has met 8 of 8 long term goals due to improved activity tolerance, improved balance, increased strength and decreased pain.  Patient to discharge at an ambulatory level Modified Independent.   Patient's care partner is independent to provide the necessary assistance at discharge.  Reasons goals not met: N/A  Recommendation:  Patient will benefit from ongoing skilled PT services in home health setting to continue to advance safe functional mobility, address ongoing impairments in LE ROM, strength, balance and minimize fall risk.  Equipment: w/c and RW  Reasons for discharge: treatment goals met  Patient/family agrees with progress made and goals achieved: Yes  PT  Discharge Precautions/Restrictions Precautions Precautions: Fall;Knee Required Braces or Orthoses: Knee Immobilizer - Right;Knee Immobilizer - Left Knee Immobilizer - Right: On at all times Knee Immobilizer - Left: On at all times Restrictions Weight Bearing Restrictions: Yes RLE Weight Bearing: Weight bearing as tolerated LLE Weight Bearing: Weight bearing as tolerated Other Position/Activity Restrictions: in knee immobilizers Pain Pain Assessment Pain Assessment: 0-10 Pain Score: 4  Faces Pain Scale: Hurts little more Pain Type: Surgical pain Pain Location: Leg Pain Orientation: Right;Left Pain Descriptors / Indicators: Aching Pain Onset: Gradual Patients Stated Pain Goal: 1 Pain Intervention(s): Medication (See eMAR) Multiple Pain Sites: No 2nd Pain Site Pain Intervention(s): Medication (See eMAR) PAINAD (Pain Assessment in Advanced Dementia) Breathing: normal Critical Care Pain Observation Tool (CPOT) Facial Expression: Relaxed, neutral Body Movements: Absence of movements Muscle Tension: Relaxed Cognition Overall Cognitive Status: Within Functional Limits for tasks assessed Arousal/Alertness: Awake/alert Orientation Level: Oriented X4 Attention: Focused Focused Attention: Appears intact Selective Attention: Appears intact Memory: Appears intact Awareness: Appears intact Problem Solving: Appears intact Safety/Judgment: Appears intact Sensation Sensation Light Touch: Impaired Detail (Pt reports numbness in B feet, decreased sensation lateral knees B) Stereognosis: Appears Intact Hot/Cold: Appears Intact Proprioception: Appears Intact Coordination Gross Motor Movements are Fluid and Coordinated: No (due to knee imobilization) Fine Motor Movements are Fluid and Coordinated: Yes Heel Shin Test: limited LE mobility due to precautions, knee immobilization  Motor  Motor Motor:  (weakness secondary to quad tendon rupture and immobilization) Motor - Skilled  Clinical Observations: generalized weakness; acute pain  Trunk/Postural Assessment  Cervical Assessment Cervical Assessment: Within Functional Limits Thoracic Assessment Thoracic Assessment: Within Functional Limits Lumbar Assessment Lumbar Assessment: Within Functional Limits Postural Control  Postural Control: Within Functional Limits  Balance Balance Balance Assessed: Yes Dynamic Sitting Balance Dynamic Sitting - Level of Assistance: 6: Modified independent (Device/Increase time) Static Standing Balance Static Standing - Level of Assistance: 6: Modified independent (Device/Increase time) Dynamic Standing Balance Dynamic Standing - Level of Assistance: 6: Modified independent (Device/Increase time) Extremity Assessment  RLE Assessment RLE Assessment: Exceptions to WFL (ROM limited due to LE locked in extension ) LLE Assessment LLE Assessment: Exceptions to [facility] (ROM limited due to LE locked in extension)   See Function Navigator for Current Functional Status.  Netta Corrigan, PT, DPT 06/26/2017, 10:43 AM

## 2017-06-27 ENCOUNTER — Telehealth (INDEPENDENT_AMBULATORY_CARE_PROVIDER_SITE_OTHER): Payer: Self-pay | Admitting: *Deleted

## 2017-06-27 MED ORDER — TRAMADOL HCL 50 MG PO TABS: 50.0000 mg | ORAL_TABLET | Freq: Four times a day (QID) | ORAL | 0 refills | Status: DC | PRN

## 2017-06-27 MED ORDER — METHOCARBAMOL 500 MG PO TABS: 500.0000 mg | ORAL_TABLET | Freq: Four times a day (QID) | ORAL | 0 refills | Status: DC | PRN

## 2017-06-27 MED ORDER — SENNOSIDES-DOCUSATE SODIUM 8.6-50 MG PO TABS: 2.0000 | ORAL_TABLET | Freq: Every day | ORAL | 0 refills | Status: DC

## 2017-06-27 MED ORDER — HYDROCODONE-ACETAMINOPHEN 5-325 MG PO TABS: 1.0000 | ORAL_TABLET | Freq: Two times a day (BID) | ORAL | 0 refills | Status: DC | PRN

## 2017-06-27 NOTE — Telephone Encounter (Signed)
Pt called left vm on triage stating he received a call yesterday but could not get to phone and thought it may have been Dr. Marlou Sa. Would like a call back [phone]

## 2017-06-27 NOTE — Telephone Encounter (Signed)
I called.

## 2017-06-27 NOTE — Progress Notes (Signed)
Patient discharged this afternoon. Discharge instructions and belonging in his possession. Nurse tech escorted patient via wheelchair along with his girlfriend to the lobby.

## 2017-06-27 NOTE — Progress Notes (Signed)
[facility]     PROGRESS NOTE  Subjective/Complaints:  Pt seen sitting up in his chair this AM.  He slept well overnight. He is appreciative of his care and ready for discharge.   ROS: Denies nausea, vomiting, diarrhea, shortness of breath or chest pain   Objective: Vital Signs: Blood pressure 127/78, pulse (!) 55, temperature 98.4 F (36.9 C), temperature source Oral, resp. rate 16, height 6' (1.829 m), SpO2 98 %. No results found.  Recent Labs  06/26/17 0445  WBC 7.2  HGB 12.4*  HCT 38.0*  PLT 286    Recent Labs  06/26/17 0445  NA 139  K 3.8  CL 106  GLUCOSE 95  BUN 21*  CREATININE 0.99  CALCIUM 8.8*   CBG (last 3)  No results for input(s): GLUCAP in the last 72 hours.  Wt Readings from Last 3 Encounters:  06/16/17 89.4 kg (197 lb)  04/12/17 89.4 kg (197 lb)  03/01/17 93.4 kg (206 lb)    Physical Exam:  BP 127/78 (BP Location: Left Arm)   Pulse (!) 55   Temp 98.4 F (36.9 C) (Oral)   Resp 16   Ht 6' (1.829 m)   SpO2 98%  Constitutional: He appears well-developedand well-nourished. NAD. HENT: Normocephalicand atraumatic.  Eyes: EOMare normal. No discharge.  Cardiovascular: RRR. No JVD . Respiratory: Effort normal and breath sounds normal. GI: Soft. Bowel sounds are normal.   Musculoskeletal: He exhibits edema BLE.  Neurological: He is alert and oriented  Motor: 5/5 b/l UE, b/l ADF/PF B/l HF 4-4+/5 (pain inhibition, improving) Skin: Bilateral knees c/d/i. Psychiatric: He has a normal mood and affect. His behavior is normal. Judgmentand thought contentnormal.    Assessment/Plan: 1. Functional deficits secondary to bilateral quad tendon ruptures which require 3+ hours per day of interdisciplinary therapy in a comprehensive inpatient rehab setting. Physiatrist is providing close team supervision and 24 hour management of active medical problems listed below. Physiatrist and rehab team continue to assess barriers  to discharge/monitor patient progress toward functional and medical goals.  Function:  Bathing Bathing position   Position: Shower  Bathing parts Body parts bathed by patient: Right arm, Left arm, Chest, Abdomen, Front perineal area, Buttocks, Right upper leg, Left upper leg, Back Body parts bathed by helper: Right lower leg, Left lower leg  Bathing assist Assist Level: Supervision or verbal cues      Upper Body Dressing/Undressing Upper body dressing   What is the patient wearing?: Pull over shirt/dress     Pull over shirt/dress - Perfomed by patient: Thread/unthread right sleeve, Thread/unthread left sleeve, Put head through opening, Pull shirt over trunk          Upper body assist Assist Level: More than reasonable time      Lower Body Dressing/Undressing Lower body dressing   What is the patient wearing?: Pants, Shoes     Pants- Performed by patient: Thread/unthread right pants leg, Thread/unthread left pants leg, Pull pants up/down Pants- Performed by helper: Thread/unthread right pants leg, Thread/unthread left pants leg   Non-skid slipper socks- Performed by helper: Don/doff right sock, Don/doff left sock     Shoes - Performed by patient: Don/doff right shoe, Don/doff left shoe, Fasten right, Fasten left            Lower body assist Assist for lower body dressing: More than reasonable time      Toileting Toileting   Toileting steps completed by patient: Adjust clothing prior to toileting, Performs  perineal hygiene, Adjust clothing after toileting   Toileting Assistive Devices: Grab bar or rail  Toileting assist Assist level: More than reasonable time   Transfers Chair/bed transfer   Chair/bed transfer method: Ambulatory Chair/bed transfer assist level: No Help, no cues, assistive device, takes more than a reasonable amount of time Chair/bed transfer assistive device: Armrests, Walker, Orthosis     Locomotion Ambulation     Max distance:  >1000' Assist level: No help, No cues, assistive device, takes more than a reasonable amount of time   Wheelchair   Type: Manual Max wheelchair distance: 200 Assist Level: No help, No cues, assistive device, takes more than reasonable amount of time  Cognition Comprehension Comprehension assist level: Follows complex conversation/direction with no assist  Expression Expression assist level: Expresses complex ideas: With no assist  Social Interaction Social Interaction assist level: Interacts appropriately with others - No medications needed.  Problem Solving Problem solving assist level: Solves complex problems: Recognizes & self-corrects  Memory Memory assist level: Complete Independence: No helper    Medical Problem List and Plan: 1. Functional and mobility deficitssecondary to bilateral quad tendon ruptures on 6/29  D/c today  Will see patient in 1 month for hospital follow up 2. DVT Prophylaxis/Anticoagulation: Pharmaceutical: Lovenox 3. Pain Management: hydrocodone prn   Robaxin for muscle spasms, increased on 7/9, improved  Encourage use of ice and heat.  4. Mood: LCSW to follow for evaluation and support.  5. Neuropsych: This patient iscapable of making decisions on hisown behalf. 6. Skin/Wound Care: monitor incisions daily for healing. Maintain adequate nutritional and hydration status.  7. Fluids/Electrolytes/Nutrition: monitor I/Os  BMP within acceptable range on 7/10 8. Constipation: Augment bowel program.   Bowel reg increased  Improving 9. GERD: on protonix.  10. H/o gout: controlled on allopurinol.  11. Ascending Aortic dilations/ Bicuspid aortic valve: on low dose BB 12. Hypoalbuminemia  Supplement initiated 7/4 13. ABLA  Hb 12.4 on 7/10  Cont to monitor 14. Sleep disturbance  Xanax per pt  Improved 15. Candidiasis  Nystatin ordered, improved  LOS (Days) 8 A FACE TO FACE EVALUATION WAS PERFORMED  Kyrene Longan Lorie Phenix 06/27/2017 8:24 AM

## 2017-06-27 NOTE — Discharge Instructions (Signed)
Inpatient Rehab Discharge Instructions  Jose [name] Discharge date and time: 06/26/17   Activities/Precautions/ Functional Status: Activity: no lifting, driving, or strenuous exercise for till cleared by MD Diet: regular diet Need to drink plenty of fluids--you are getting dehydrated.  Wound Care: Wash with soap and water. Pat dry. Keep Knee immobilizers on at al time. May loose when flat and supported in bed.    Functional status:  ___ No restrictions     ___ Walk up steps independently ___ 24/7 supervision/assistance   ___ Walk up steps with assistance ___ Intermittent supervision/assistance  ___ Bathe/dress independently _X__ Walk with walker    _X__ Bathe/dress with supervision  ___ Walk Independently    ___ Shower independently _X__ Walk with supervision    ___ Shower with assistance _X__ No alcohol     ___ Return to work/school ________  Special Instructions: 1. Ice knees after activity and 2-3 times a day for pain control.    COMMUNITY REFERRALS UPON DISCHARGE:    Home Health:   PT, OT, RN  Agency:[facility]   Phone:[phone]   Date of last service:06/27/2017  Medical Equipment/Items Ordered:WHEELCHAIR, ROLLING WALKER, BEDSIDE COMMODE, TUB TRANSFER BENCH & GERI CHAIR  Agency/Supplier:[facility]   [phone] Other:PLANS TO PRIVATELY PAY FOR GERI CHAIR MADE ARRANGEMENTS WITH [facility]    Opioid Use Disorder Opioids are powerful substances that relieve pain. Opioids include illegal drugs, such as heroin, as well as prescription pain medicines, such as codeine, morphine, hydrocodone, oxycodone, and fentanyl. Opioid use disorder is when you take opioids for nonmedical reasons even though taking them hurts your health and well-being. Taking prescribed opioids regularly can lead to dependence, especially if you take them in larger amounts or more often than they should be taken. Opioid use disorder often disrupts life at home, work, or school. It can cause mental  and physical problems. It also increases your risk of suicide and death from overdose. What are the causes? This condition is caused by taking opioids. Taking opioids repeatedly results in changes in the brain that make it hard to control opioid use. Many people develop this condition because they like the way they feel when they take opioids or because they get addicted to them. What increases the risk? This condition is more likely to develop in:  People with a family history of opioid use disorder.  People who misuse other drugs.  People with a mental illness, such as depression, post-traumatic stress disorder, or antisocial personality disorder.  People who begin use at an early age, such as during their teen years.  What are the signs or symptoms? Symptoms of this condition include:  Taking greater amounts of an opioid than you want to or for longer than you want to.  Trying several times to control your opioid use.  Spending a lot of time getting opioids, using them, or recovering from their effects.  Craving opioids.  Having problems at work, at school, at home, or in a relationship because of opioid use.  Giving up or cutting down on important life activities because of opioid use.  Using opioids when it is dangerous, such as when driving a car.  Continuing to use an opioid even though it has led to a physical problem, such as: ? Severe constipation. ? Poor nutrition. ? Infertility. ? Tuberculosis. ? Aspiration pneumonia. ? An infection, such as hepatitis or HIV (human immunodeficiency virus).  Continuing to use an opioid even though it is causing a mental problem, such  as: ? Depression or anxiety. ? Hallucinations. ? Sleep problems. ? Loss of interest in sex.  Needing more and more of an opioid to get the same effect that you want from the opioid (building up a tolerance).  Having symptoms of withdrawal when you stop using an opioid. Some symptoms of withdrawal  are: ? Depression or anxiety. ? Irritability. ? Nausea or vomiting. ? Muscle aches or spasms. ? Watery eyes. ? Trouble sleeping. ? Yawning.  How is this diagnosed? This condition is diagnosed with an assessment. During the assessment, your health care provider will ask about your opioid use and how it affects your life. Your health care provider may perform a physical exam or do lab tests to see if you have physical problems resulting from opioid use. Your health care provider may also screen for drug use and refer you to a mental health professional for evaluation. How is this treated? Treatment for this condition is usually provided by mental health professionals with training in substance use disorders. The first step in treatment is detoxification, which involves taking medicines to lessen withdrawal symptoms. Additional treatment may involve:  Counseling. This treatment is also called talk therapy. It is provided by substance use treatment counselors. A counselor can address the reasons you use opioids and suggest ways to keep you from using opioids again. The goals of talk therapy are to: ? Find healthy activities and ways to cope with stress. ? Identify and avoid what triggers your opioid use. ? Help you learn how to handle cravings.  Support groups. Support groups are run by people who have quit using opioids. They provide emotional support, advice, and guidance.  A medicine that blocks opioid receptors in your brain. This medicine can reduce opioid cravings that lead to relapse. This medicine also blocks the good feeling that you get from using opioids.  Opioid maintenance treatment. This involves taking certain kinds of opioid medicines. These medicines satisfy cravings but are safer than commonly misused opioids. This is often the best option for people who continue to relapse with other treatments.  Follow these instructions at home:  Take over-the-counter and prescription  medicines only as told by your health care provider.  Check with your health care provider before starting any new medicines.  Keep all follow-up visits as told by your health care provider. This is important. Where to find more information:  [employer] on Drug Abuse: motorcyclefax.com  Substance Abuse and Mental Health Services Administration: ktimeonline.com Contact a health care provider if:  You are not able to take your medicines as told.  Your symptoms get worse. Get help right away if:  You may have taken too much of an opioid (overdosed).  You have serious thoughts about hurting yourself or others. If you ever feel like you may hurt yourself or others, or have thoughts about taking your own life, get help right away. You can go to your nearest emergency department or call:  Your local emergency services (911 in the [place]  A suicide crisis helpline, such as the [place] at 219-785-1241. This is open 24 hours a day.  This information is not intended to replace advice given to you by your health care provider. Make sure you discuss any questions you have with your health care provider. Document Released: 10/01/2007 Document Revised: 09/15/2016 Document Reviewed: 09/15/2016 Elsevier Interactive Patient Education  2018 [employer].     My questions have been answered and I understand these instructions. I will adhere  to these goals and the provided educational materials after my discharge from the hospital.  Patient/Caregiver Signature _______________________________ Date __________  Clinician Signature _______________________________________ Date __________  Please bring this form and your medication list with you to all your follow-up doctor's appointments.

## 2017-06-27 NOTE — Plan of Care (Signed)
Problem: RH BOWEL ELIMINATION Goal: RH STG MANAGE BOWEL WITH ASSISTANCE STG Manage Bowel with Assistance.  Outcome: Progressing Minimum assistance Goal: RH STG MANAGE BOWEL W/MEDICATION W/ASSISTANCE STG Manage Bowel with Medication with Assistance.  Outcome: Progressing On Senna and Miralax  Problem: RH SKIN INTEGRITY Goal: RH STG SKIN FREE OF INFECTION/BREAKDOWN Outcome: Progressing No skin breakdown or infection noted

## 2017-06-29 ENCOUNTER — Ambulatory Visit (INDEPENDENT_AMBULATORY_CARE_PROVIDER_SITE_OTHER): Payer: Medicare Other | Admitting: Orthopedic Surgery

## 2017-06-29 ENCOUNTER — Encounter (INDEPENDENT_AMBULATORY_CARE_PROVIDER_SITE_OTHER): Payer: Self-pay | Admitting: Orthopedic Surgery

## 2017-06-29 ENCOUNTER — Telehealth (INDEPENDENT_AMBULATORY_CARE_PROVIDER_SITE_OTHER): Payer: Self-pay

## 2017-06-29 DIAGNOSIS — S76111S Strain of right quadriceps muscle, fascia and tendon, sequela: Secondary | ICD-10-CM

## 2017-06-29 DIAGNOSIS — S76112S Strain of left quadriceps muscle, fascia and tendon, sequela: Secondary | ICD-10-CM

## 2017-06-29 MED ORDER — HYDROCODONE-ACETAMINOPHEN 5-325 MG PO TABS: 1.0000 | ORAL_TABLET | Freq: Four times a day (QID) | ORAL | 0 refills | Status: DC | PRN

## 2017-06-29 NOTE — Telephone Encounter (Signed)
Patient stated that brace for right new is not locked and that knee is moving. Patient stated that he is on his way back to the office.  Cb# is [phone].

## 2017-06-30 DIAGNOSIS — K219 Gastro-esophageal reflux disease without esophagitis: Secondary | ICD-10-CM | POA: Diagnosis not present

## 2017-06-30 DIAGNOSIS — D62 Acute posthemorrhagic anemia: Secondary | ICD-10-CM | POA: Diagnosis not present

## 2017-06-30 DIAGNOSIS — S76102D Unspecified injury of left quadriceps muscle, fascia and tendon, subsequent encounter: Secondary | ICD-10-CM | POA: Diagnosis not present

## 2017-06-30 DIAGNOSIS — E46 Unspecified protein-calorie malnutrition: Secondary | ICD-10-CM | POA: Diagnosis not present

## 2017-06-30 DIAGNOSIS — M170 Bilateral primary osteoarthritis of knee: Secondary | ICD-10-CM | POA: Diagnosis not present

## 2017-06-30 DIAGNOSIS — R296 Repeated falls: Secondary | ICD-10-CM | POA: Diagnosis not present

## 2017-06-30 DIAGNOSIS — S76101D Unspecified injury of right quadriceps muscle, fascia and tendon, subsequent encounter: Secondary | ICD-10-CM | POA: Diagnosis not present

## 2017-07-01 DIAGNOSIS — R296 Repeated falls: Secondary | ICD-10-CM | POA: Diagnosis not present

## 2017-07-01 DIAGNOSIS — S76102D Unspecified injury of left quadriceps muscle, fascia and tendon, subsequent encounter: Secondary | ICD-10-CM | POA: Diagnosis not present

## 2017-07-01 DIAGNOSIS — S76101D Unspecified injury of right quadriceps muscle, fascia and tendon, subsequent encounter: Secondary | ICD-10-CM | POA: Diagnosis not present

## 2017-07-01 DIAGNOSIS — D62 Acute posthemorrhagic anemia: Secondary | ICD-10-CM | POA: Diagnosis not present

## 2017-07-01 DIAGNOSIS — M170 Bilateral primary osteoarthritis of knee: Secondary | ICD-10-CM | POA: Diagnosis not present

## 2017-07-01 DIAGNOSIS — K219 Gastro-esophageal reflux disease without esophagitis: Secondary | ICD-10-CM | POA: Diagnosis not present

## 2017-07-01 NOTE — Progress Notes (Signed)
Post-Op Visit Note   Patient: Jose [name]           Date of Birth: Mar 19, 1951           MRN: [id number] Visit Date: 06/29/2017 PCP: [name], MD   Assessment & Plan:  Chief Complaint:  Chief Complaint  Patient presents with  . Left Leg - Routine Post Op  . Right Leg - Routine Post Op   Visit Diagnoses:  1. Quadriceps tendon rupture, left, sequela   2. Rupture of quadriceps tendon, right, sequela     Plan:   Jose [name] is a 66 year old patient who underwent bilateral quad rupture repair 06/06/2018.  Been ambulating.  His been in bilateral Bledsoe braces.  Walked 1.3 miles yesterday.  On exam incisions intact no calf tenderness present no refill is Norco change the brace settings 20 30.  He does have full quad extension and can do straight leg raises about 10-15 reps each with each leg.  0-30 for both knees follow-up in 2 weeks change him over to 60 at that time.  Follow-Up Instructions: Return in about 2 weeks (around 07/13/2017).   Orders:  No orders of the defined types were placed in this encounter.  Meds ordered this encounter  Medications  . HYDROcodone-acetaminophen (NORCO/VICODIN) 5-325 MG tablet    Sig: Take 1 tablet by mouth every 6 (six) hours as needed for moderate pain.    Dispense:  42 tablet    Refill:  0    Imaging: No results found.  PMFS History: Patient Active Problem List   Diagnosis Date Noted  . Candidiasis   . Abnormality of gait   . Hypoalbuminemia due to protein-calorie malnutrition ([place])   . History of gout   . Sleep disturbance   . Constipation   . Rupture of quadriceps tendon, right, sequela 06/19/2017  . Quadriceps tendon rupture, left, sequela 06/19/2017  . Acute blood loss anemia 06/19/2017  . Fall   . History of subdural hematoma   . Post-operative pain   . Recurrent falls   . Quadriceps tendon rupture 06/15/2017  . Primary osteoarthritis of both knees 02/28/2017  . Primary osteoarthritis of both hands 02/28/2017  .  Uricacidemia 02/28/2017  . Pain in joint of left knee 02/07/2017  . Idiopathic chronic gout, unspecified site, without tophus (tophi) 11/29/2016  . HEMATURIA UNSPECIFIED 01/25/2009  . ABDOMINAL PAIN 01/25/2009  . XERODERMA 03/10/2008  . UNS ADVRS EFF UNS RX MEDICINAL&BIOLOGICAL SBSTNC 03/10/2008  . ADJUSTMENT REACTION WITH PHYSICAL SYMPTOMS 02/14/2008  . MUSCLE SPASM 02/14/2008  . ACUTE PROSTATITIS 12/16/2007  . BREAST LUMP OR MASS, RIGHT 07/12/2007  . H/O: RCT (rotator cuff tear) 01/13/2007  . GOUT 01/08/2007   Past Medical History:  Diagnosis Date  . Arthritis    R shoulder, great toes, hands- has gout & has injections in knees with Dr. Estanislado Pandy   . Cancer ([place])    skin ca-   . GERD (gastroesophageal reflux disease)   . History of hiatal hernia   . SDH (subdural hematoma) ([facility])     Family History  Problem Relation Age of Onset  . Parkinson's disease Mother   . Heart disease Father   . Parkinson's disease Brother   . Diabetes Brother     Past Surgical History:  Procedure Laterality Date  . BRAIN SURGERY  2016   evacuation of SDH  . CARPAL TUNNEL RELEASE Bilateral   . GREEN LIGHT LASER TURP (TRANSURETHRAL RESECTION OF PROSTATE  04/27/2017  . QUADRICEPS TENDON  REPAIR Bilateral 06/15/2017   Procedure: REPAIR QUADRICEP TENDON;  Surgeon: Meredith Pel, MD;  Location: [facility];  Service: Orthopedics;  Laterality: Bilateral;  . SHOULDER SURGERY Right    Social History   Occupational History  . Not on file.   Social History Main Topics  . Smoking status: Current Every Day Smoker    Types: Cigars  . Smokeless tobacco: Never Used     Comment: celebration use   . Alcohol use 1.8 oz/week    2 Glasses of wine, 1 Shots of liquor per week     Comment: 2 drinks per day  . Drug use: No  . Sexual activity: Not on file

## 2017-07-02 NOTE — Telephone Encounter (Signed)
Patient never came by. He said that he went by [employer] instead. But assured me that brace was locked at 30.

## 2017-07-03 DIAGNOSIS — D62 Acute posthemorrhagic anemia: Secondary | ICD-10-CM | POA: Diagnosis not present

## 2017-07-03 DIAGNOSIS — S76101D Unspecified injury of right quadriceps muscle, fascia and tendon, subsequent encounter: Secondary | ICD-10-CM | POA: Diagnosis not present

## 2017-07-03 DIAGNOSIS — S76102D Unspecified injury of left quadriceps muscle, fascia and tendon, subsequent encounter: Secondary | ICD-10-CM | POA: Diagnosis not present

## 2017-07-03 DIAGNOSIS — R296 Repeated falls: Secondary | ICD-10-CM | POA: Diagnosis not present

## 2017-07-03 DIAGNOSIS — K219 Gastro-esophageal reflux disease without esophagitis: Secondary | ICD-10-CM | POA: Diagnosis not present

## 2017-07-03 DIAGNOSIS — M170 Bilateral primary osteoarthritis of knee: Secondary | ICD-10-CM | POA: Diagnosis not present

## 2017-07-04 DIAGNOSIS — S76102D Unspecified injury of left quadriceps muscle, fascia and tendon, subsequent encounter: Secondary | ICD-10-CM | POA: Diagnosis not present

## 2017-07-04 DIAGNOSIS — R296 Repeated falls: Secondary | ICD-10-CM | POA: Diagnosis not present

## 2017-07-04 DIAGNOSIS — M170 Bilateral primary osteoarthritis of knee: Secondary | ICD-10-CM | POA: Diagnosis not present

## 2017-07-04 DIAGNOSIS — K219 Gastro-esophageal reflux disease without esophagitis: Secondary | ICD-10-CM | POA: Diagnosis not present

## 2017-07-04 DIAGNOSIS — S76101D Unspecified injury of right quadriceps muscle, fascia and tendon, subsequent encounter: Secondary | ICD-10-CM | POA: Diagnosis not present

## 2017-07-04 DIAGNOSIS — D62 Acute posthemorrhagic anemia: Secondary | ICD-10-CM | POA: Diagnosis not present

## 2017-07-06 ENCOUNTER — Telehealth: Payer: Self-pay | Admitting: *Deleted

## 2017-07-06 DIAGNOSIS — D62 Acute posthemorrhagic anemia: Secondary | ICD-10-CM | POA: Diagnosis not present

## 2017-07-06 DIAGNOSIS — S76102D Unspecified injury of left quadriceps muscle, fascia and tendon, subsequent encounter: Secondary | ICD-10-CM | POA: Diagnosis not present

## 2017-07-06 DIAGNOSIS — R296 Repeated falls: Secondary | ICD-10-CM | POA: Diagnosis not present

## 2017-07-06 DIAGNOSIS — K219 Gastro-esophageal reflux disease without esophagitis: Secondary | ICD-10-CM | POA: Diagnosis not present

## 2017-07-06 DIAGNOSIS — S76101D Unspecified injury of right quadriceps muscle, fascia and tendon, subsequent encounter: Secondary | ICD-10-CM | POA: Diagnosis not present

## 2017-07-06 DIAGNOSIS — M170 Bilateral primary osteoarthritis of knee: Secondary | ICD-10-CM | POA: Diagnosis not present

## 2017-07-06 NOTE — Telephone Encounter (Signed)
Yes, he may.  Thanks

## 2017-07-06 NOTE — Telephone Encounter (Signed)
Notified Mr Bohnsack.  He asked about swelling and I advised to keep elevated and ice 15 min intervals.

## 2017-07-06 NOTE — Telephone Encounter (Signed)
Jose [name] is having increased pain in his left knee with increased walking therapy. Stanton Kidney PT is asking if he can take some advil in the evening to help with this.  He is ok during the day.  Please advise.

## 2017-07-07 ENCOUNTER — Other Ambulatory Visit (INDEPENDENT_AMBULATORY_CARE_PROVIDER_SITE_OTHER): Payer: Self-pay | Admitting: Specialist

## 2017-07-07 MED ORDER — DICLOFENAC SODIUM 1 % TD GEL: 4.0000 g | Freq: Four times a day (QID) | TRANSDERMAL | 1 refills | Status: DC

## 2017-07-10 DIAGNOSIS — R296 Repeated falls: Secondary | ICD-10-CM | POA: Diagnosis not present

## 2017-07-10 DIAGNOSIS — S76102D Unspecified injury of left quadriceps muscle, fascia and tendon, subsequent encounter: Secondary | ICD-10-CM | POA: Diagnosis not present

## 2017-07-10 DIAGNOSIS — K219 Gastro-esophageal reflux disease without esophagitis: Secondary | ICD-10-CM | POA: Diagnosis not present

## 2017-07-10 DIAGNOSIS — D62 Acute posthemorrhagic anemia: Secondary | ICD-10-CM | POA: Diagnosis not present

## 2017-07-10 DIAGNOSIS — S76101D Unspecified injury of right quadriceps muscle, fascia and tendon, subsequent encounter: Secondary | ICD-10-CM | POA: Diagnosis not present

## 2017-07-10 DIAGNOSIS — M170 Bilateral primary osteoarthritis of knee: Secondary | ICD-10-CM | POA: Diagnosis not present

## 2017-07-12 ENCOUNTER — Ambulatory Visit (INDEPENDENT_AMBULATORY_CARE_PROVIDER_SITE_OTHER): Payer: Medicare Other | Admitting: Orthopedic Surgery

## 2017-07-12 DIAGNOSIS — S76102D Unspecified injury of left quadriceps muscle, fascia and tendon, subsequent encounter: Secondary | ICD-10-CM | POA: Diagnosis not present

## 2017-07-12 DIAGNOSIS — M170 Bilateral primary osteoarthritis of knee: Secondary | ICD-10-CM | POA: Diagnosis not present

## 2017-07-12 DIAGNOSIS — K219 Gastro-esophageal reflux disease without esophagitis: Secondary | ICD-10-CM | POA: Diagnosis not present

## 2017-07-12 DIAGNOSIS — D62 Acute posthemorrhagic anemia: Secondary | ICD-10-CM | POA: Diagnosis not present

## 2017-07-12 DIAGNOSIS — R296 Repeated falls: Secondary | ICD-10-CM | POA: Diagnosis not present

## 2017-07-12 DIAGNOSIS — S76112S Strain of left quadriceps muscle, fascia and tendon, sequela: Secondary | ICD-10-CM

## 2017-07-12 DIAGNOSIS — S76101D Unspecified injury of right quadriceps muscle, fascia and tendon, subsequent encounter: Secondary | ICD-10-CM | POA: Diagnosis not present

## 2017-07-12 MED ORDER — BACLOFEN 10 MG PO TABS: 10.0000 mg | ORAL_TABLET | Freq: Three times a day (TID) | ORAL | 0 refills | Status: DC

## 2017-07-12 MED ORDER — HYDROCODONE-ACETAMINOPHEN 5-325 MG PO TABS: ORAL_TABLET | ORAL | 0 refills | Status: DC

## 2017-07-12 NOTE — Progress Notes (Signed)
Post-Op Visit Note   Patient: Jose [name]           Date of Birth: 05/09/1951           MRN: [id number] Visit Date: 07/12/2017 PCP: [name], MD   Assessment & Plan:  Chief Complaint:  Chief Complaint  Patient presents with  . Right Leg - Routine Post Op  . Left Leg - Routine Post Op   Visit Diagnoses: No diagnosis found.  Plan: Jose [name] is a 66 year old patient bilateral quad tendon rupture repair.  He is in Bledsoe brace is set 0-30.  He has been a relating.  Left one hurts worse than the right.  On exam the calf tenderness on either side.  No effusion.  He condition leg raises bilaterally.  Plan is to continue with home health physical therapy to work on range of motion not to exceed 90.  Continue ambulating with the brace is 0-30.  Like to see him back in a week change braces 0-45.  Follow-Up Instructions: Return in about 1 week (around 07/19/2017).   Orders:  No orders of the defined types were placed in this encounter.  Meds ordered this encounter  Medications  . baclofen (LIORESAL) 10 MG tablet    Sig: Take 1 tablet (10 mg total) by mouth 3 (three) times daily.    Dispense:  40 each    Refill:  0  . HYDROcodone-acetaminophen (NORCO) 5-325 MG tablet    Sig: 1 po q 6-8hrs prn pain    Dispense:  40 tablet    Refill:  0    Imaging: No results found.  PMFS History: Patient Active Problem List   Diagnosis Date Noted  . Candidiasis   . Abnormality of gait   . Hypoalbuminemia due to protein-calorie malnutrition ([place])   . History of gout   . Sleep disturbance   . Constipation   . Rupture of quadriceps tendon, right, sequela 06/19/2017  . Quadriceps tendon rupture, left, sequela 06/19/2017  . Acute blood loss anemia 06/19/2017  . Fall   . History of subdural hematoma   . Post-operative pain   . Recurrent falls   . Quadriceps tendon rupture 06/15/2017  . Primary osteoarthritis of both knees 02/28/2017  . Primary osteoarthritis of both hands  02/28/2017  . Uricacidemia 02/28/2017  . Pain in joint of left knee 02/07/2017  . Idiopathic chronic gout, unspecified site, without tophus (tophi) 11/29/2016  . HEMATURIA UNSPECIFIED 01/25/2009  . ABDOMINAL PAIN 01/25/2009  . XERODERMA 03/10/2008  . UNS ADVRS EFF UNS RX MEDICINAL&BIOLOGICAL SBSTNC 03/10/2008  . ADJUSTMENT REACTION WITH PHYSICAL SYMPTOMS 02/14/2008  . MUSCLE SPASM 02/14/2008  . ACUTE PROSTATITIS 12/16/2007  . BREAST LUMP OR MASS, RIGHT 07/12/2007  . H/O: RCT (rotator cuff tear) 01/13/2007  . GOUT 01/08/2007   Past Medical History:  Diagnosis Date  . Arthritis    R shoulder, great toes, hands- has gout & has injections in knees with Dr. Estanislado Pandy   . Cancer ([place])    skin ca-   . GERD (gastroesophageal reflux disease)   . History of hiatal hernia   . SDH (subdural hematoma) ([facility])     Family History  Problem Relation Age of Onset  . Parkinson's disease Mother   . Heart disease Father   . Parkinson's disease Brother   . Diabetes Brother     Past Surgical History:  Procedure Laterality Date  . BRAIN SURGERY  2016   evacuation of SDH  . CARPAL TUNNEL RELEASE  Bilateral   . GREEN LIGHT LASER TURP (TRANSURETHRAL RESECTION OF PROSTATE  04/27/2017  . QUADRICEPS TENDON REPAIR Bilateral 06/15/2017   Procedure: REPAIR QUADRICEP TENDON;  Surgeon: Meredith Pel, MD;  Location: [facility];  Service: Orthopedics;  Laterality: Bilateral;  . SHOULDER SURGERY Right    Social History   Occupational History  . Not on file.   Social History Main Topics  . Smoking status: Current Every Day Smoker    Types: Cigars  . Smokeless tobacco: Never Used     Comment: celebration use   . Alcohol use 1.8 oz/week    2 Glasses of wine, 1 Shots of liquor per week     Comment: 2 drinks per day  . Drug use: No  . Sexual activity: Not on file

## 2017-07-13 DIAGNOSIS — K219 Gastro-esophageal reflux disease without esophagitis: Secondary | ICD-10-CM | POA: Diagnosis not present

## 2017-07-13 DIAGNOSIS — D62 Acute posthemorrhagic anemia: Secondary | ICD-10-CM | POA: Diagnosis not present

## 2017-07-13 DIAGNOSIS — M170 Bilateral primary osteoarthritis of knee: Secondary | ICD-10-CM | POA: Diagnosis not present

## 2017-07-13 DIAGNOSIS — R296 Repeated falls: Secondary | ICD-10-CM | POA: Diagnosis not present

## 2017-07-13 DIAGNOSIS — S76102D Unspecified injury of left quadriceps muscle, fascia and tendon, subsequent encounter: Secondary | ICD-10-CM | POA: Diagnosis not present

## 2017-07-13 DIAGNOSIS — S76101D Unspecified injury of right quadriceps muscle, fascia and tendon, subsequent encounter: Secondary | ICD-10-CM | POA: Diagnosis not present

## 2017-07-17 DIAGNOSIS — D62 Acute posthemorrhagic anemia: Secondary | ICD-10-CM | POA: Diagnosis not present

## 2017-07-17 DIAGNOSIS — M170 Bilateral primary osteoarthritis of knee: Secondary | ICD-10-CM | POA: Diagnosis not present

## 2017-07-17 DIAGNOSIS — K219 Gastro-esophageal reflux disease without esophagitis: Secondary | ICD-10-CM | POA: Diagnosis not present

## 2017-07-17 DIAGNOSIS — S76101D Unspecified injury of right quadriceps muscle, fascia and tendon, subsequent encounter: Secondary | ICD-10-CM | POA: Diagnosis not present

## 2017-07-17 DIAGNOSIS — R296 Repeated falls: Secondary | ICD-10-CM | POA: Diagnosis not present

## 2017-07-17 DIAGNOSIS — S76102D Unspecified injury of left quadriceps muscle, fascia and tendon, subsequent encounter: Secondary | ICD-10-CM | POA: Diagnosis not present

## 2017-07-18 DIAGNOSIS — K219 Gastro-esophageal reflux disease without esophagitis: Secondary | ICD-10-CM | POA: Diagnosis not present

## 2017-07-18 DIAGNOSIS — S76102D Unspecified injury of left quadriceps muscle, fascia and tendon, subsequent encounter: Secondary | ICD-10-CM | POA: Diagnosis not present

## 2017-07-18 DIAGNOSIS — D62 Acute posthemorrhagic anemia: Secondary | ICD-10-CM | POA: Diagnosis not present

## 2017-07-18 DIAGNOSIS — E46 Unspecified protein-calorie malnutrition: Secondary | ICD-10-CM | POA: Diagnosis not present

## 2017-07-18 DIAGNOSIS — R296 Repeated falls: Secondary | ICD-10-CM | POA: Diagnosis not present

## 2017-07-18 DIAGNOSIS — S76101D Unspecified injury of right quadriceps muscle, fascia and tendon, subsequent encounter: Secondary | ICD-10-CM | POA: Diagnosis not present

## 2017-07-18 DIAGNOSIS — M170 Bilateral primary osteoarthritis of knee: Secondary | ICD-10-CM | POA: Diagnosis not present

## 2017-07-19 ENCOUNTER — Encounter (INDEPENDENT_AMBULATORY_CARE_PROVIDER_SITE_OTHER): Payer: Self-pay | Admitting: Orthopedic Surgery

## 2017-07-19 ENCOUNTER — Encounter: Payer: Medicare Other | Attending: Physical Medicine & Rehabilitation | Admitting: Physical Medicine & Rehabilitation

## 2017-07-19 ENCOUNTER — Ambulatory Visit (INDEPENDENT_AMBULATORY_CARE_PROVIDER_SITE_OTHER): Payer: Medicare Other | Admitting: Orthopedic Surgery

## 2017-07-19 ENCOUNTER — Encounter: Payer: Self-pay | Admitting: Physical Medicine & Rehabilitation

## 2017-07-19 VITALS — BP 109/80 | HR 60

## 2017-07-19 DIAGNOSIS — S76102D Unspecified injury of left quadriceps muscle, fascia and tendon, subsequent encounter: Secondary | ICD-10-CM | POA: Diagnosis not present

## 2017-07-19 DIAGNOSIS — S76112S Strain of left quadriceps muscle, fascia and tendon, sequela: Secondary | ICD-10-CM | POA: Diagnosis not present

## 2017-07-19 DIAGNOSIS — R296 Repeated falls: Secondary | ICD-10-CM | POA: Diagnosis not present

## 2017-07-19 DIAGNOSIS — K219 Gastro-esophageal reflux disease without esophagitis: Secondary | ICD-10-CM | POA: Diagnosis not present

## 2017-07-19 DIAGNOSIS — R269 Unspecified abnormalities of gait and mobility: Secondary | ICD-10-CM | POA: Diagnosis not present

## 2017-07-19 DIAGNOSIS — K5900 Constipation, unspecified: Secondary | ICD-10-CM | POA: Diagnosis not present

## 2017-07-19 DIAGNOSIS — S76111S Strain of right quadriceps muscle, fascia and tendon, sequela: Secondary | ICD-10-CM | POA: Diagnosis not present

## 2017-07-19 DIAGNOSIS — M109 Gout, unspecified: Secondary | ICD-10-CM | POA: Diagnosis not present

## 2017-07-19 DIAGNOSIS — G479 Sleep disorder, unspecified: Secondary | ICD-10-CM | POA: Diagnosis not present

## 2017-07-19 DIAGNOSIS — Z09 Encounter for follow-up examination after completed treatment for conditions other than malignant neoplasm: Secondary | ICD-10-CM | POA: Diagnosis not present

## 2017-07-19 DIAGNOSIS — F17290 Nicotine dependence, other tobacco product, uncomplicated: Secondary | ICD-10-CM | POA: Insufficient documentation

## 2017-07-19 DIAGNOSIS — G8918 Other acute postprocedural pain: Secondary | ICD-10-CM | POA: Diagnosis not present

## 2017-07-19 DIAGNOSIS — M170 Bilateral primary osteoarthritis of knee: Secondary | ICD-10-CM | POA: Diagnosis not present

## 2017-07-19 DIAGNOSIS — M62838 Other muscle spasm: Secondary | ICD-10-CM | POA: Insufficient documentation

## 2017-07-19 DIAGNOSIS — D62 Acute posthemorrhagic anemia: Secondary | ICD-10-CM | POA: Diagnosis not present

## 2017-07-19 DIAGNOSIS — S76101D Unspecified injury of right quadriceps muscle, fascia and tendon, subsequent encounter: Secondary | ICD-10-CM | POA: Diagnosis not present

## 2017-07-19 NOTE — Progress Notes (Signed)
Post-Op Visit Note   Patient: Jose [name]           Date of Birth: 1951/09/09           MRN: [id number] Visit Date: 07/19/2017 PCP: [name], MD   Assessment & Plan:  Chief Complaint:  Chief Complaint  Patient presents with  . Right Leg - Routine Post Op  . Left Leg - Routine Post Op   Visit Diagnoses: No diagnosis found.  Plan: Jose [name] is a patient underwent bilateral quad tendon rupture repair 06/15/2017.  He is doing well.  He is weightbearing as tolerated in bilateral Bledsoe braces at 0-30.  Magnesium has helped his spasms.  On examination tenderness present.  Extension is intact bilaterally.  Plan is to change his brace settings 0-45 today and then in 1 week to like him to go to 60 when he sees my partner.  After that To 75 a week later.  After that I will likely discontinue use of the braces.  Follow-Up Instructions: Return in about 1 week (around 07/26/2017).   Orders:  No orders of the defined types were placed in this encounter.  No orders of the defined types were placed in this encounter.   Imaging: No results found.  PMFS History: Patient Active Problem List   Diagnosis Date Noted  . Candidiasis   . Abnormality of gait   . Hypoalbuminemia due to protein-calorie malnutrition ([place])   . History of gout   . Sleep disturbance   . Constipation   . Rupture of quadriceps tendon, right, sequela 06/19/2017  . Quadriceps tendon rupture, left, sequela 06/19/2017  . Acute blood loss anemia 06/19/2017  . Fall   . History of subdural hematoma   . Post-operative pain   . Recurrent falls   . Quadriceps tendon rupture 06/15/2017  . Primary osteoarthritis of both knees 02/28/2017  . Primary osteoarthritis of both hands 02/28/2017  . Uricacidemia 02/28/2017  . Pain in joint of left knee 02/07/2017  . Idiopathic chronic gout, unspecified site, without tophus (tophi) 11/29/2016  . HEMATURIA UNSPECIFIED 01/25/2009  . ABDOMINAL PAIN 01/25/2009  . XERODERMA  03/10/2008  . UNS ADVRS EFF UNS RX MEDICINAL&BIOLOGICAL SBSTNC 03/10/2008  . ADJUSTMENT REACTION WITH PHYSICAL SYMPTOMS 02/14/2008  . MUSCLE SPASM 02/14/2008  . ACUTE PROSTATITIS 12/16/2007  . BREAST LUMP OR MASS, RIGHT 07/12/2007  . H/O: RCT (rotator cuff tear) 01/13/2007  . GOUT 01/08/2007   Past Medical History:  Diagnosis Date  . Arthritis    R shoulder, great toes, hands- has gout & has injections in knees with Dr. Estanislado Pandy   . Cancer ([place])    skin ca-   . GERD (gastroesophageal reflux disease)   . History of hiatal hernia   . SDH (subdural hematoma) ([facility])     Family History  Problem Relation Age of Onset  . Parkinson's disease Mother   . Heart disease Father   . Parkinson's disease Brother   . Diabetes Brother     Past Surgical History:  Procedure Laterality Date  . BRAIN SURGERY  2016   evacuation of SDH  . CARPAL TUNNEL RELEASE Bilateral   . GREEN LIGHT LASER TURP (TRANSURETHRAL RESECTION OF PROSTATE  04/27/2017  . QUADRICEPS TENDON REPAIR Bilateral 06/15/2017   Procedure: REPAIR QUADRICEP TENDON;  Surgeon: Meredith Pel, MD;  Location: [facility];  Service: Orthopedics;  Laterality: Bilateral;  . SHOULDER SURGERY Right    Social History   Occupational History  . Not on file.  Social History Main Topics  . Smoking status: Current Every Day Smoker    Types: Cigars  . Smokeless tobacco: Never Used     Comment: celebration use   . Alcohol use 1.8 oz/week    2 Glasses of wine, 1 Shots of liquor per week     Comment: 2 drinks per day  . Drug use: No  . Sexual activity: Not on file

## 2017-07-19 NOTE — Progress Notes (Signed)
Subjective:    Patient ID: Jose [name], male    DOB: [birth year], 66 y.o.   MRN: [id number]  HPI 66 y.o. male with history of SDH, OA bilateral knees, history of falls, partial quad rupture on left presents for hospital follow up for b/l quad tendon ruptures.   Admit date: 06/19/2017 Discharge date: 06/27/2017  At discharge, he was instructed to wear knee immobilizers at all times, which he has been doing.  He continues to take ASA.  He continues to use ice.  He sees Ortho later today. He is making progress with ROM.  He has spasms,mostly at night.  Pain has been controlled. Bowel movements are regular.  His sleep is okay if his spasms are controlled.  Denies falls.   Therapies: 2/week HH.  Pain Inventory Average Pain 3 Pain Right Now 2 My pain is sharp and dull  In the last 24 hours, has pain interfered with the following? General activity 7 Relation with others 9 Enjoyment of life 10 What TIME of day is your pain at its worst? night Sleep (in general) Poor  Pain is worse with: inactivity Pain improves with: heat/ice, therapy/exercise, pacing activities and medication Relief from Meds: 9  Mobility use a walker ability to climb steps?  yes do you drive?  no  Function employed # of hrs/week . I need assistance with the following:  bathing  Neuro/Psych numbness spasms  Prior Studies Any changes since last visit?  no  Physicians involved in your care Any changes since last visit?  no   Family History  Problem Relation Age of Onset  . Parkinson's disease Mother   . Heart disease Father   . Parkinson's disease Brother   . Diabetes Brother    Social History   Social History  . Marital status: Married    Spouse name: N/A  . Number of children: N/A  . Years of education: N/A   Social History Main Topics  . Smoking status: Current Every Day Smoker    Types: Cigars  . Smokeless tobacco: Never Used     Comment: celebration use   . Alcohol use 1.8 oz/week      2 Glasses of wine, 1 Shots of liquor per week     Comment: 2 drinks per day  . Drug use: No  . Sexual activity: Not Asked   Other Topics Concern  . None   Social History Narrative  . None   Past Surgical History:  Procedure Laterality Date  . BRAIN SURGERY  2016   evacuation of SDH  . CARPAL TUNNEL RELEASE Bilateral   . GREEN LIGHT LASER TURP (TRANSURETHRAL RESECTION OF PROSTATE  04/27/2017  . QUADRICEPS TENDON REPAIR Bilateral 06/15/2017   Procedure: REPAIR QUADRICEP TENDON;  Surgeon: Meredith Pel, MD;  Location[place];  Service: Orthopedics;  Laterality: Bilateral;  . SHOULDER SURGERY Right    Past Medical History:  Diagnosis Date  . Arthritis    R shoulder, great toes, hands- has gout & has injections in knees with Dr. Estanislado Pandy   . Cancer ([place])    skin ca-   . GERD (gastroesophageal reflux disease)   . History of hiatal hernia   . SDH (subdural hematoma) (HCC)    BP 109/80   Pulse 60   SpO2 98%   Opioid Risk Score:   Fall Risk Score:  `1  Depression screen PHQ 2/9  Depression screen PHQ 2/9 07/19/2017  Decreased Interest 0  Down, Depressed, Hopeless 0  PHQ - 2 Score 0  Altered sleeping 2  Tired, decreased energy 0  Change in appetite 0  Feeling bad or failure about yourself  0  Trouble concentrating 0  Moving slowly or fidgety/restless 0  Suicidal thoughts 0  PHQ-9 Score 2  Difficult doing work/chores Not difficult at all     Review of Systems  Constitutional: Negative.   HENT: Negative.   Eyes: Negative.   Respiratory: Negative.   Cardiovascular: Negative.   Gastrointestinal: Negative.   Endocrine: Negative.   Genitourinary: Negative.   Musculoskeletal: Negative.   Skin: Negative.   Allergic/Immunologic: Negative.   Neurological: Negative.   Hematological: Negative.   Psychiatric/Behavioral: Negative.   All other systems reviewed and are negative.      Objective:   Physical Exam Constitutional: He appears well-developed and  well-nourished. NAD. HENT: Normocephalic and atraumatic.  Eyes: EOM are normal. No discharge.  Cardiovascular: RRR. No JVD . Respiratory: Effort normal and breath sounds normal.  GI: Soft. Bowel sounds are normal.   Musculoskeletal: He exhibits edema bilateral knees.  Neurological: He is alert and oriented  Motor: 5/5 b/l UE, b/l ADF/PF B/l HF 4+/5  Skin: Bilateral knees incisions c/d/i.  Psychiatric: He has a normal mood and affect. His behavior is normal. Judgment and thought content normal.     Assessment & Plan:  66 y.o. male with history of SDH, OA bilateral knees, history of falls, partial quad rupture on left presents for hospital follow up for b/l quad tendon ruptures.   1. Functional and mobility deficits secondary to bilateral quad tendon ruptures on 6/29  Cont therapies  Cont follow up with Ortho  2. Pain: mainly spasms  Cont Robaxin PRN  Cont ice PRN  3. Constipation:   Regular with meds  4. Sleep disturbance  Improved and continues to improve  5. Gait abnormality  Cont walker at all times.  Meds reviewed Referrals reviewed All questions answered

## 2017-07-23 DIAGNOSIS — S76101D Unspecified injury of right quadriceps muscle, fascia and tendon, subsequent encounter: Secondary | ICD-10-CM | POA: Diagnosis not present

## 2017-07-23 DIAGNOSIS — M170 Bilateral primary osteoarthritis of knee: Secondary | ICD-10-CM | POA: Diagnosis not present

## 2017-07-23 DIAGNOSIS — K219 Gastro-esophageal reflux disease without esophagitis: Secondary | ICD-10-CM | POA: Diagnosis not present

## 2017-07-23 DIAGNOSIS — D62 Acute posthemorrhagic anemia: Secondary | ICD-10-CM | POA: Diagnosis not present

## 2017-07-23 DIAGNOSIS — S76102D Unspecified injury of left quadriceps muscle, fascia and tendon, subsequent encounter: Secondary | ICD-10-CM | POA: Diagnosis not present

## 2017-07-23 DIAGNOSIS — R296 Repeated falls: Secondary | ICD-10-CM | POA: Diagnosis not present

## 2017-07-26 DIAGNOSIS — D62 Acute posthemorrhagic anemia: Secondary | ICD-10-CM | POA: Diagnosis not present

## 2017-07-26 DIAGNOSIS — M170 Bilateral primary osteoarthritis of knee: Secondary | ICD-10-CM | POA: Diagnosis not present

## 2017-07-26 DIAGNOSIS — S76102D Unspecified injury of left quadriceps muscle, fascia and tendon, subsequent encounter: Secondary | ICD-10-CM | POA: Diagnosis not present

## 2017-07-26 DIAGNOSIS — R296 Repeated falls: Secondary | ICD-10-CM | POA: Diagnosis not present

## 2017-07-26 DIAGNOSIS — K219 Gastro-esophageal reflux disease without esophagitis: Secondary | ICD-10-CM | POA: Diagnosis not present

## 2017-07-26 DIAGNOSIS — S76101D Unspecified injury of right quadriceps muscle, fascia and tendon, subsequent encounter: Secondary | ICD-10-CM | POA: Diagnosis not present

## 2017-07-27 ENCOUNTER — Encounter (INDEPENDENT_AMBULATORY_CARE_PROVIDER_SITE_OTHER): Payer: Self-pay

## 2017-07-27 ENCOUNTER — Ambulatory Visit (INDEPENDENT_AMBULATORY_CARE_PROVIDER_SITE_OTHER): Payer: Medicare Other | Admitting: Orthopaedic Surgery

## 2017-07-27 DIAGNOSIS — F438 Other reactions to severe stress: Secondary | ICD-10-CM | POA: Diagnosis not present

## 2017-07-30 ENCOUNTER — Ambulatory Visit (INDEPENDENT_AMBULATORY_CARE_PROVIDER_SITE_OTHER): Payer: Medicare Other | Admitting: Physician Assistant

## 2017-07-30 DIAGNOSIS — S76111S Strain of right quadriceps muscle, fascia and tendon, sequela: Secondary | ICD-10-CM

## 2017-07-30 MED ORDER — ALPRAZOLAM 0.25 MG PO TABS: 0.2500 mg | ORAL_TABLET | Freq: Every evening | ORAL | 0 refills | Status: DC | PRN

## 2017-07-30 NOTE — Progress Notes (Signed)
Post-Op Visit Note   Patient: Jose [name]           Date of Birth: Oct 28, 1951           MRN: [id number] Visit Date: 07/30/2017 PCP: [name], MD   Assessment & Plan: 5 days status post bilateral quadriceps tendon repair. Brace settings changed from 0-60 flexion today. He'll follow with Dr. Marlou Sa in 1 week change flexion to 75. He did ask for some Xanax to help sleep at night. I did discuss with him good sleep hygiene. He is just having a rough time sleeping due to these injuries.  Chief Complaint: No chief complaint on file.  Visit Diagnoses:  1. Rupture of quadriceps tendon, right, sequela    History of present illness: Mr.Jose [name] 45 days status post repair of bilateral quadricep tendon ruptures. Overall doing well. He is having some difficulty sleeping due to the pain been taking naps during the day. Otherwise no complaints  Physical exam: Surgical incisions are healing well no signs of infection. He has good range of motion of both knees without pain. Calves are supple and nontender. Plan:  Follow-Up Instructions: No Follow-up on file.   Orders:  No orders of the defined types were placed in this encounter.  Meds ordered this encounter  Medications  . ALPRAZolam (XANAX) 0.25 MG tablet    Sig: Take 1 tablet (0.25 mg total) by mouth at bedtime as needed for anxiety.    Dispense:  20 tablet    Refill:  0    Imaging: No results found.  PMFS History: Patient Active Problem List   Diagnosis Date Noted  . Candidiasis   . Abnormality of gait   . Hypoalbuminemia due to protein-calorie malnutrition ([place])   . History of gout   . Sleep disturbance   . Constipation   . Rupture of quadriceps tendon, right, sequela 06/19/2017  . Quadriceps tendon rupture, left, sequela 06/19/2017  . Acute blood loss anemia 06/19/2017  . Fall   . History of subdural hematoma   . Post-operative pain   . Recurrent falls   . Quadriceps tendon rupture 06/15/2017  . Primary  osteoarthritis of both knees 02/28/2017  . Primary osteoarthritis of both hands 02/28/2017  . Uricacidemia 02/28/2017  . Pain in joint of left knee 02/07/2017  . Idiopathic chronic gout, unspecified site, without tophus (tophi) 11/29/2016  . HEMATURIA UNSPECIFIED 01/25/2009  . ABDOMINAL PAIN 01/25/2009  . XERODERMA 03/10/2008  . UNS ADVRS EFF UNS RX MEDICINAL&BIOLOGICAL SBSTNC 03/10/2008  . ADJUSTMENT REACTION WITH PHYSICAL SYMPTOMS 02/14/2008  . MUSCLE SPASM 02/14/2008  . ACUTE PROSTATITIS 12/16/2007  . BREAST LUMP OR MASS, RIGHT 07/12/2007  . H/O: RCT (rotator cuff tear) 01/13/2007  . GOUT 01/08/2007   Past Medical History:  Diagnosis Date  . Arthritis    R shoulder, great toes, hands- has gout & has injections in knees with Dr. Estanislado Pandy   . Cancer ([place])    skin ca-   . GERD (gastroesophageal reflux disease)   . History of hiatal hernia   . SDH (subdural hematoma) (HCC)     Family History  Problem Relation Age of Onset  . Parkinson's disease Mother   . Heart disease Father   . Parkinson's disease Brother   . Diabetes Brother     Past Surgical History:  Procedure Laterality Date  . BRAIN SURGERY  2016   evacuation of SDH  . CARPAL TUNNEL RELEASE Bilateral   . GREEN LIGHT LASER TURP (TRANSURETHRAL RESECTION  OF PROSTATE  04/27/2017  . QUADRICEPS TENDON REPAIR Bilateral 06/15/2017   Procedure: REPAIR QUADRICEP TENDON;  Surgeon: Meredith Pel, MD;  Location: [place];  Service: Orthopedics;  Laterality: Bilateral;  . SHOULDER SURGERY Right    Social History   Occupational History  . Not on file.   Social History Main Topics  . Smoking status: Current Every Day Smoker    Types: Cigars  . Smokeless tobacco: Never Used     Comment: celebration use   . Alcohol use 1.8 oz/week    2 Glasses of wine, 1 Shots of liquor per week     Comment: 2 drinks per day  . Drug use: No  . Sexual activity: Not on file

## 2017-07-31 DIAGNOSIS — K219 Gastro-esophageal reflux disease without esophagitis: Secondary | ICD-10-CM | POA: Diagnosis not present

## 2017-07-31 DIAGNOSIS — R296 Repeated falls: Secondary | ICD-10-CM | POA: Diagnosis not present

## 2017-07-31 DIAGNOSIS — M170 Bilateral primary osteoarthritis of knee: Secondary | ICD-10-CM | POA: Diagnosis not present

## 2017-07-31 DIAGNOSIS — D62 Acute posthemorrhagic anemia: Secondary | ICD-10-CM | POA: Diagnosis not present

## 2017-07-31 DIAGNOSIS — S76101D Unspecified injury of right quadriceps muscle, fascia and tendon, subsequent encounter: Secondary | ICD-10-CM | POA: Diagnosis not present

## 2017-07-31 DIAGNOSIS — S76102D Unspecified injury of left quadriceps muscle, fascia and tendon, subsequent encounter: Secondary | ICD-10-CM | POA: Diagnosis not present

## 2017-08-02 ENCOUNTER — Telehealth (INDEPENDENT_AMBULATORY_CARE_PROVIDER_SITE_OTHER): Payer: Self-pay | Admitting: Orthopedic Surgery

## 2017-08-02 DIAGNOSIS — R296 Repeated falls: Secondary | ICD-10-CM | POA: Diagnosis not present

## 2017-08-02 DIAGNOSIS — S76101D Unspecified injury of right quadriceps muscle, fascia and tendon, subsequent encounter: Secondary | ICD-10-CM | POA: Diagnosis not present

## 2017-08-02 DIAGNOSIS — S76102D Unspecified injury of left quadriceps muscle, fascia and tendon, subsequent encounter: Secondary | ICD-10-CM | POA: Diagnosis not present

## 2017-08-02 DIAGNOSIS — M170 Bilateral primary osteoarthritis of knee: Secondary | ICD-10-CM | POA: Diagnosis not present

## 2017-08-02 DIAGNOSIS — K219 Gastro-esophageal reflux disease without esophagitis: Secondary | ICD-10-CM | POA: Diagnosis not present

## 2017-08-02 DIAGNOSIS — D62 Acute posthemorrhagic anemia: Secondary | ICD-10-CM | POA: Diagnosis not present

## 2017-08-02 NOTE — Telephone Encounter (Signed)
IC verbal given.  

## 2017-08-02 NOTE — Telephone Encounter (Signed)
Mary from [facility] called asking for a continuation of PT for the patient. 2 times a week for 2 weeks. CB # [phone]

## 2017-08-06 ENCOUNTER — Telehealth: Payer: Self-pay | Admitting: *Deleted

## 2017-08-06 DIAGNOSIS — R296 Repeated falls: Secondary | ICD-10-CM | POA: Diagnosis not present

## 2017-08-06 DIAGNOSIS — K219 Gastro-esophageal reflux disease without esophagitis: Secondary | ICD-10-CM | POA: Diagnosis not present

## 2017-08-06 DIAGNOSIS — D62 Acute posthemorrhagic anemia: Secondary | ICD-10-CM | POA: Diagnosis not present

## 2017-08-06 DIAGNOSIS — S76102D Unspecified injury of left quadriceps muscle, fascia and tendon, subsequent encounter: Secondary | ICD-10-CM | POA: Diagnosis not present

## 2017-08-06 DIAGNOSIS — M170 Bilateral primary osteoarthritis of knee: Secondary | ICD-10-CM | POA: Diagnosis not present

## 2017-08-06 DIAGNOSIS — S76101D Unspecified injury of right quadriceps muscle, fascia and tendon, subsequent encounter: Secondary | ICD-10-CM | POA: Diagnosis not present

## 2017-08-06 NOTE — Telephone Encounter (Signed)
Mary PT [facility] called for orders for PT 2wk2 as he will be homebound for a couple of more weeks.Marland Kitchen Approval given.

## 2017-08-08 ENCOUNTER — Encounter (HOSPITAL_COMMUNITY): Payer: Self-pay | Admitting: Orthopedic Surgery

## 2017-08-09 ENCOUNTER — Encounter (INDEPENDENT_AMBULATORY_CARE_PROVIDER_SITE_OTHER): Payer: Self-pay | Admitting: Orthopedic Surgery

## 2017-08-09 ENCOUNTER — Ambulatory Visit (INDEPENDENT_AMBULATORY_CARE_PROVIDER_SITE_OTHER): Payer: Medicare Other | Admitting: Orthopedic Surgery

## 2017-08-09 DIAGNOSIS — S76111S Strain of right quadriceps muscle, fascia and tendon, sequela: Secondary | ICD-10-CM

## 2017-08-09 MED ORDER — OXYCODONE HCL 5 MG PO CAPS
5.0000 mg | ORAL_CAPSULE | Freq: Two times a day (BID) | ORAL | 0 refills | Status: DC | PRN
Start: 2017-08-09 — End: 2017-08-23

## 2017-08-09 MED ORDER — TEMAZEPAM 7.5 MG PO CAPS: 7.5000 mg | ORAL_CAPSULE | Freq: Every evening | ORAL | 0 refills | Status: DC | PRN

## 2017-08-09 NOTE — Progress Notes (Signed)
Post-Op Visit Note   Patient: Rosemary Pentecost Graley           Date of Birth: 1951-02-07           MRN: [id number] Visit Date: 08/09/2017 PCP: [name], MD   Assessment & Plan:  Chief Complaint:  Chief Complaint  Patient presents with  . Left Leg - Follow-up  . Right Leg - Follow-up   Visit Diagnoses: No diagnosis found.  Plan: Discussed corpsman is a 67 mL patient bilateral quad tendon repair.  He's doing better.  He is making progress.  On examination he can do about 20-30 straight-leg raises with each knee.  Less than 5 extensor lag.  Flexion is to about 90 bilaterally.  Plan is to refill oxycodone one twice a day and add Restoril to take at night to help him sleep.  He shouldn't take both of those together.  Increased brace to 90 with 2 week return and likely discontinuation of the braces at that time.  He has changed from using a walker to canes.  He is doing exercises 3 times a day.  Overall is making good progress  Follow-Up Instructions: Return in about 2 weeks (around 08/23/2017).   Orders:  No orders of the defined types were placed in this encounter.  Meds ordered this encounter  Medications  . oxycodone (OXY-IR) 5 MG capsule    Sig: Take 1 capsule (5 mg total) by mouth every 12 (twelve) hours as needed.    Dispense:  30 capsule    Refill:  0  . temazepam (RESTORIL) 7.5 MG capsule    Sig: Take 1 capsule (7.5 mg total) by mouth at bedtime as needed for sleep.    Dispense:  30 capsule    Refill:  0    Imaging: No results found.  PMFS History: Patient Active Problem List   Diagnosis Date Noted  . Candidiasis   . Abnormality of gait   . Hypoalbuminemia due to protein-calorie malnutrition ([place])   . History of gout   . Sleep disturbance   . Constipation   . Rupture of quadriceps tendon, right, sequela 06/19/2017  . Quadriceps tendon rupture, left, sequela 06/19/2017  . Acute blood loss anemia 06/19/2017  . Fall   . History of subdural hematoma   .  Post-operative pain   . Recurrent falls   . Quadriceps tendon rupture 06/15/2017  . Primary osteoarthritis of both knees 02/28/2017  . Primary osteoarthritis of both hands 02/28/2017  . Uricacidemia 02/28/2017  . Pain in joint of left knee 02/07/2017  . Idiopathic chronic gout, unspecified site, without tophus (tophi) 11/29/2016  . HEMATURIA UNSPECIFIED 01/25/2009  . ABDOMINAL PAIN 01/25/2009  . XERODERMA 03/10/2008  . UNS ADVRS EFF UNS RX MEDICINAL&BIOLOGICAL SBSTNC 03/10/2008  . ADJUSTMENT REACTION WITH PHYSICAL SYMPTOMS 02/14/2008  . MUSCLE SPASM 02/14/2008  . ACUTE PROSTATITIS 12/16/2007  . BREAST LUMP OR MASS, RIGHT 07/12/2007  . H/O: RCT (rotator cuff tear) 01/13/2007  . GOUT 01/08/2007   Past Medical History:  Diagnosis Date  . Arthritis    R shoulder, great toes, hands- has gout & has injections in knees with Dr. Estanislado Pandy   . Cancer ([place])    skin ca-   . GERD (gastroesophageal reflux disease)   . History of hiatal hernia   . SDH (subdural hematoma) (HCC)     Family History  Problem Relation Age of Onset  . Parkinson's disease Mother   . Heart disease Father   . Parkinson's disease  Brother   . Diabetes Brother     Past Surgical History:  Procedure Laterality Date  . BRAIN SURGERY  2016   evacuation of SDH  . CARPAL TUNNEL RELEASE Bilateral   . GREEN LIGHT LASER TURP (TRANSURETHRAL RESECTION OF PROSTATE  04/27/2017  . QUADRICEPS TENDON REPAIR Bilateral 06/15/2017   Procedure: REPAIR QUADRICEP TENDON;  Surgeon: Meredith Pel, MD;  Location: [place];  Service: Orthopedics;  Laterality: Bilateral;  . SHOULDER SURGERY Right    Social History   Occupational History  . Not on file.   Social History Main Topics  . Smoking status: Current Every Day Smoker    Types: Cigars  . Smokeless tobacco: Never Used     Comment: celebration use   . Alcohol use 1.8 oz/week    2 Glasses of wine, 1 Shots of liquor per week     Comment: 2 drinks per day  . Drug use: No    . Sexual activity: Not on file

## 2017-08-10 ENCOUNTER — Telehealth (INDEPENDENT_AMBULATORY_CARE_PROVIDER_SITE_OTHER): Payer: Self-pay

## 2017-08-10 DIAGNOSIS — R296 Repeated falls: Secondary | ICD-10-CM | POA: Diagnosis not present

## 2017-08-10 DIAGNOSIS — S76101D Unspecified injury of right quadriceps muscle, fascia and tendon, subsequent encounter: Secondary | ICD-10-CM | POA: Diagnosis not present

## 2017-08-10 DIAGNOSIS — S76102D Unspecified injury of left quadriceps muscle, fascia and tendon, subsequent encounter: Secondary | ICD-10-CM | POA: Diagnosis not present

## 2017-08-10 DIAGNOSIS — K219 Gastro-esophageal reflux disease without esophagitis: Secondary | ICD-10-CM | POA: Diagnosis not present

## 2017-08-10 DIAGNOSIS — M170 Bilateral primary osteoarthritis of knee: Secondary | ICD-10-CM | POA: Diagnosis not present

## 2017-08-10 DIAGNOSIS — D62 Acute posthemorrhagic anemia: Secondary | ICD-10-CM | POA: Diagnosis not present

## 2017-08-10 NOTE — Telephone Encounter (Signed)
IC advised.  

## 2017-08-10 NOTE — Telephone Encounter (Signed)
Okay for 10 pounds or less please call thanks

## 2017-08-10 NOTE — Telephone Encounter (Signed)
Please advise 

## 2017-08-10 NOTE — Telephone Encounter (Signed)
Mary with [facility] would like to confirm if patient was told by Dr. Marlou Sa to use light weight for extension.  Cb# is [phone].  Please advise.  Thank You.

## 2017-08-13 DIAGNOSIS — D62 Acute posthemorrhagic anemia: Secondary | ICD-10-CM | POA: Diagnosis not present

## 2017-08-13 DIAGNOSIS — K219 Gastro-esophageal reflux disease without esophagitis: Secondary | ICD-10-CM | POA: Diagnosis not present

## 2017-08-13 DIAGNOSIS — S76101D Unspecified injury of right quadriceps muscle, fascia and tendon, subsequent encounter: Secondary | ICD-10-CM | POA: Diagnosis not present

## 2017-08-13 DIAGNOSIS — M170 Bilateral primary osteoarthritis of knee: Secondary | ICD-10-CM | POA: Diagnosis not present

## 2017-08-13 DIAGNOSIS — S76102D Unspecified injury of left quadriceps muscle, fascia and tendon, subsequent encounter: Secondary | ICD-10-CM | POA: Diagnosis not present

## 2017-08-13 DIAGNOSIS — R296 Repeated falls: Secondary | ICD-10-CM | POA: Diagnosis not present

## 2017-08-14 DIAGNOSIS — F438 Other reactions to severe stress: Secondary | ICD-10-CM | POA: Diagnosis not present

## 2017-08-17 ENCOUNTER — Telehealth (INDEPENDENT_AMBULATORY_CARE_PROVIDER_SITE_OTHER): Payer: Self-pay

## 2017-08-17 DIAGNOSIS — S76102D Unspecified injury of left quadriceps muscle, fascia and tendon, subsequent encounter: Secondary | ICD-10-CM | POA: Diagnosis not present

## 2017-08-17 DIAGNOSIS — K219 Gastro-esophageal reflux disease without esophagitis: Secondary | ICD-10-CM | POA: Diagnosis not present

## 2017-08-17 DIAGNOSIS — D62 Acute posthemorrhagic anemia: Secondary | ICD-10-CM | POA: Diagnosis not present

## 2017-08-17 DIAGNOSIS — S76101D Unspecified injury of right quadriceps muscle, fascia and tendon, subsequent encounter: Secondary | ICD-10-CM | POA: Diagnosis not present

## 2017-08-17 DIAGNOSIS — M170 Bilateral primary osteoarthritis of knee: Secondary | ICD-10-CM | POA: Diagnosis not present

## 2017-08-17 DIAGNOSIS — R296 Repeated falls: Secondary | ICD-10-CM | POA: Diagnosis not present

## 2017-08-17 NOTE — Telephone Encounter (Signed)
Mary with [facility] stated that patient would like 2 more PT visits and then transfer to out patient therapy.  Would like to know if Dr. Marlou Sa had any recommendations for out patient therapy.  Cb# is [phone].  Please advise.  Thank You.

## 2017-08-21 NOTE — Telephone Encounter (Signed)
Please advise on outpatient therapy. Thanks.

## 2017-08-21 NOTE — Telephone Encounter (Signed)
Called gave verbal. Also faxed order to [facility] Patient will call to schedule.

## 2017-08-21 NOTE — Telephone Encounter (Signed)
Please send to [facility] physical therapy 1-2 times a week for leg strengthening and gait training for 6 weeks total thank you

## 2017-08-22 DIAGNOSIS — S76101D Unspecified injury of right quadriceps muscle, fascia and tendon, subsequent encounter: Secondary | ICD-10-CM | POA: Diagnosis not present

## 2017-08-22 DIAGNOSIS — F438 Other reactions to severe stress: Secondary | ICD-10-CM | POA: Diagnosis not present

## 2017-08-22 DIAGNOSIS — R296 Repeated falls: Secondary | ICD-10-CM | POA: Diagnosis not present

## 2017-08-22 DIAGNOSIS — K219 Gastro-esophageal reflux disease without esophagitis: Secondary | ICD-10-CM | POA: Diagnosis not present

## 2017-08-22 DIAGNOSIS — D62 Acute posthemorrhagic anemia: Secondary | ICD-10-CM | POA: Diagnosis not present

## 2017-08-22 DIAGNOSIS — S76102D Unspecified injury of left quadriceps muscle, fascia and tendon, subsequent encounter: Secondary | ICD-10-CM | POA: Diagnosis not present

## 2017-08-22 DIAGNOSIS — M170 Bilateral primary osteoarthritis of knee: Secondary | ICD-10-CM | POA: Diagnosis not present

## 2017-08-23 ENCOUNTER — Ambulatory Visit (INDEPENDENT_AMBULATORY_CARE_PROVIDER_SITE_OTHER): Payer: Medicare Other | Admitting: Orthopedic Surgery

## 2017-08-23 ENCOUNTER — Encounter (INDEPENDENT_AMBULATORY_CARE_PROVIDER_SITE_OTHER): Payer: Self-pay | Admitting: Orthopedic Surgery

## 2017-08-23 DIAGNOSIS — S76111S Strain of right quadriceps muscle, fascia and tendon, sequela: Secondary | ICD-10-CM | POA: Diagnosis not present

## 2017-08-23 MED ORDER — OXYCODONE HCL 5 MG PO CAPS: 5.0000 mg | ORAL_CAPSULE | Freq: Two times a day (BID) | ORAL | 0 refills | Status: DC | PRN

## 2017-08-23 NOTE — Progress Notes (Signed)
Post-Op Visit Note   Patient: Jose [name]           Date of Birth: 12/31/1950           MRN: [id number] Visit Date: 08/23/2017 PCP: [name], MD   Assessment & Plan:  Chief Complaint:  Chief Complaint  Patient presents with  . Post-op Follow-up    bilateral quad tendon repair on 06/15/17   Visit Diagnoses: No diagnosis found.  Plan: Phil is a patient who 66 years old who is now about 10 weeks out bilateral quad tendon repair.  He's been in hinged knee braces.  On exam right leg looks excellent.  Excellent strength with no extensor lag.  On the left-hand side there is a slight defect superior lateral to the patella.  This is where his chronic quad rupture was.  The medial half of the quad repair feels excellent and he has only about a 5 extensor lag on the left-hand side.  Plan at this time is to transition outpatient physical therapy.  One time prescription pain medicine prescribed.  He ising himself off with no pain medicine during  In his knee brace will be supplied.  8 week return for final check.  Follow-Up Instructions: Return in about 8 weeks (around 10/18/2017).   Orders:  No orders of the defined types were placed in this encounter.  Meds ordered this encounter  Medications  . oxycodone (OXY-IR) 5 MG capsule    Sig: Take 1 capsule (5 mg total) by mouth every 12 (twelve) hours as needed.    Dispense:  30 capsule    Refill:  0    Imaging: No results found.  PMFS History: Patient Active Problem List   Diagnosis Date Noted  . Candidiasis   . Abnormality of gait   . Hypoalbuminemia due to protein-calorie malnutrition ([place])   . History of gout   . Sleep disturbance   . Constipation   . Rupture of quadriceps tendon, right, sequela 06/19/2017  . Quadriceps tendon rupture, left, sequela 06/19/2017  . Acute blood loss anemia 06/19/2017  . Fall   . History of subdural hematoma   . Post-operative pain   . Recurrent falls   . Quadriceps tendon rupture  06/15/2017  . Primary osteoarthritis of both knees 02/28/2017  . Primary osteoarthritis of both hands 02/28/2017  . Uricacidemia 02/28/2017  . Pain in joint of left knee 02/07/2017  . Idiopathic chronic gout, unspecified site, without tophus (tophi) 11/29/2016  . HEMATURIA UNSPECIFIED 01/25/2009  . ABDOMINAL PAIN 01/25/2009  . XERODERMA 03/10/2008  . UNS ADVRS EFF UNS RX MEDICINAL&BIOLOGICAL SBSTNC 03/10/2008  . ADJUSTMENT REACTION WITH PHYSICAL SYMPTOMS 02/14/2008  . MUSCLE SPASM 02/14/2008  . ACUTE PROSTATITIS 12/16/2007  . BREAST LUMP OR MASS, RIGHT 07/12/2007  . H/O: RCT (rotator cuff tear) 01/13/2007  . GOUT 01/08/2007   Past Medical History:  Diagnosis Date  . Arthritis    R shoulder, great toes, hands- has gout & has injections in knees with Dr. Estanislado Pandy   . Cancer ([place])    skin ca-   . GERD (gastroesophageal reflux disease)   . History of hiatal hernia   . SDH (subdural hematoma) (HCC)     Family History  Problem Relation Age of Onset  . Parkinson's disease Mother   . Heart disease Father   . Parkinson's disease Brother   . Diabetes Brother     Past Surgical History:  Procedure Laterality Date  . BRAIN SURGERY  2016  evacuation of SDH  . CARPAL TUNNEL RELEASE Bilateral   . GREEN LIGHT LASER TURP (TRANSURETHRAL RESECTION OF PROSTATE  04/27/2017  . QUADRICEPS TENDON REPAIR Bilateral 06/15/2017   Procedure: REPAIR QUADRICEP TENDON;  Surgeon: Meredith Pel, MD;  Location: [place];  Service: Orthopedics;  Laterality: Bilateral;  . SHOULDER SURGERY Right    Social History   Occupational History  . Not on file.   Social History Main Topics  . Smoking status: Current Every Day Smoker    Types: Cigars  . Smokeless tobacco: Never Used     Comment: celebration use   . Alcohol use 1.8 oz/week    2 Glasses of wine, 1 Shots of liquor per week     Comment: 2 drinks per day  . Drug use: No  . Sexual activity: Not on file

## 2017-08-24 ENCOUNTER — Telehealth (INDEPENDENT_AMBULATORY_CARE_PROVIDER_SITE_OTHER): Payer: Self-pay

## 2017-08-24 DIAGNOSIS — M170 Bilateral primary osteoarthritis of knee: Secondary | ICD-10-CM | POA: Diagnosis not present

## 2017-08-24 DIAGNOSIS — G8918 Other acute postprocedural pain: Secondary | ICD-10-CM

## 2017-08-24 DIAGNOSIS — D62 Acute posthemorrhagic anemia: Secondary | ICD-10-CM | POA: Diagnosis not present

## 2017-08-24 DIAGNOSIS — R296 Repeated falls: Secondary | ICD-10-CM | POA: Diagnosis not present

## 2017-08-24 DIAGNOSIS — S76101D Unspecified injury of right quadriceps muscle, fascia and tendon, subsequent encounter: Secondary | ICD-10-CM | POA: Diagnosis not present

## 2017-08-24 DIAGNOSIS — S76102D Unspecified injury of left quadriceps muscle, fascia and tendon, subsequent encounter: Secondary | ICD-10-CM | POA: Diagnosis not present

## 2017-08-24 DIAGNOSIS — K219 Gastro-esophageal reflux disease without esophagitis: Secondary | ICD-10-CM | POA: Diagnosis not present

## 2017-08-24 NOTE — Telephone Encounter (Signed)
Please advise. Thanks.  

## 2017-08-24 NOTE — Telephone Encounter (Signed)
Pls rf to [facility] thx

## 2017-08-24 NOTE — Addendum Note (Signed)
Addended byLaurann Montana on: 08/24/2017 10:39 AM   Modules accepted: Orders

## 2017-08-24 NOTE — Telephone Encounter (Signed)
Error

## 2017-08-24 NOTE — Telephone Encounter (Signed)
Referral made. Patient aware.  

## 2017-08-24 NOTE — Telephone Encounter (Signed)
Patient called stating [facility] does not accept his insurance and would like to know about being referred to another place for physical therapy.  Cb# is [phone].  Please advise.  Thank You.

## 2017-08-25 NOTE — Progress Notes (Deleted)
Office Visit Note  Patient: Jose [name]             Date of Birth: 07/16/1951           MRN: [id number]             PCP: [name], MD Referring: [name], MD Visit Date: 08/31/2017 Occupation: @GUAROCC @    Subjective:  No chief complaint on file.   History of Present Illness: Jose [name] is a 66 y.o. male ***   Activities of Daily Living:  Patient reports morning stiffness for *** {minute/hour:19697}.   Patient {ACTIONS;DENIES/REPORTS:21021675::"Denies"} nocturnal pain.  Difficulty dressing/grooming: {ACTIONS;DENIES/REPORTS:21021675::"Denies"} Difficulty climbing stairs: {ACTIONS;DENIES/REPORTS:21021675::"Denies"} Difficulty getting out of chair: {ACTIONS;DENIES/REPORTS:21021675::"Denies"} Difficulty using hands for taps, buttons, cutlery, and/or writing: {ACTIONS;DENIES/REPORTS:21021675::"Denies"}   No Rheumatology ROS completed.   PMFS History:  Patient Active Problem List   Diagnosis Date Noted  . Candidiasis   . Abnormality of gait   . Hypoalbuminemia due to protein-calorie malnutrition ([place])   . History of gout   . Sleep disturbance   . Constipation   . Rupture of quadriceps tendon, right, sequela 06/19/2017  . Quadriceps tendon rupture, left, sequela 06/19/2017  . Acute blood loss anemia 06/19/2017  . Fall   . History of subdural hematoma   . Post-operative pain   . Recurrent falls   . Quadriceps tendon rupture 06/15/2017  . Primary osteoarthritis of both knees 02/28/2017  . Primary osteoarthritis of both hands 02/28/2017  . Uricacidemia 02/28/2017  . Pain in joint of left knee 02/07/2017  . Idiopathic chronic gout, unspecified site, without tophus (tophi) 11/29/2016  . HEMATURIA UNSPECIFIED 01/25/2009  . ABDOMINAL PAIN 01/25/2009  . XERODERMA 03/10/2008  . UNS ADVRS EFF UNS RX MEDICINAL&BIOLOGICAL SBSTNC 03/10/2008  . ADJUSTMENT REACTION WITH PHYSICAL SYMPTOMS 02/14/2008  . MUSCLE SPASM 02/14/2008  . ACUTE PROSTATITIS 12/16/2007    . BREAST LUMP OR MASS, RIGHT 07/12/2007  . H/O: RCT (rotator cuff tear) 01/13/2007  . GOUT 01/08/2007    Past Medical History:  Diagnosis Date  . Arthritis    R shoulder, great toes, hands- has gout & has injections in knees with Dr. Estanislado Pandy   . Cancer ([place])    skin ca-   . GERD (gastroesophageal reflux disease)   . History of hiatal hernia   . SDH (subdural hematoma) (HCC)     Family History  Problem Relation Age of Onset  . Parkinson's disease Mother   . Heart disease Father   . Parkinson's disease Brother   . Diabetes Brother    Past Surgical History:  Procedure Laterality Date  . BRAIN SURGERY  2016   evacuation of SDH  . CARPAL TUNNEL RELEASE Bilateral   . GREEN LIGHT LASER TURP (TRANSURETHRAL RESECTION OF PROSTATE  04/27/2017  . QUADRICEPS TENDON REPAIR Bilateral 06/15/2017   Procedure: REPAIR QUADRICEP TENDON;  Surgeon: Meredith Pel, MD;  Location: [place];  Service: Orthopedics;  Laterality: Bilateral;  . SHOULDER SURGERY Right    Social History   Social History Narrative  . No narrative on file     Objective: Vital Signs: There were no vitals taken for this visit.   Physical Exam   Musculoskeletal Exam: ***  CDAI Exam: No CDAI exam completed.    Investigation: No additional findings.Uric acid: 4.8 in 02/2017 CBC Latest Ref Rng & Units 06/26/2017 06/20/2017 06/15/2017  WBC 4.0 - 10.5 K/uL 7.2 7.8 9.3  Hemoglobin 13.0 - 17.0 g/dL 12.4(L) 12.4(L) 15.3  Hematocrit 39.0 - 52.0 %  38.0(L) 37.4(L) 45.2  Platelets 150 - 400 K/uL 286 231 191   CMP Latest Ref Rng & Units 06/26/2017 06/20/2017 06/15/2017  Glucose 65 - 99 mg/dL 95 108(H) 99  BUN 6 - 20 mg/dL 21(H) 12 11  Creatinine 0.61 - 1.24 mg/dL 0.99 1.00 1.01  Sodium 135 - 145 mmol/L 139 140 139  Potassium 3.5 - 5.1 mmol/L 3.8 3.8 3.5  Chloride 101 - 111 mmol/L 106 104 104  CO2 22 - 32 mmol/L 26 28 25   Calcium 8.9 - 10.3 mg/dL 8.8(L) 8.7(L) 9.2  Total Protein 6.5 - 8.1 g/dL - 5.7(L) 6.9  Total  Bilirubin 0.3 - 1.2 mg/dL - 0.6 0.6  Alkaline Phos 38 - 126 U/L - 48 56  AST 15 - 41 U/L - 24 29  ALT 17 - 63 U/L - 23 32    Imaging: No results found.  Speciality Comments: No specialty comments available.    Procedures:  No procedures performed Allergies: Patient has no known allergies.   Assessment / Plan:     Visit Diagnoses: No diagnosis found.    Orders: No orders of the defined types were placed in this encounter.  No orders of the defined types were placed in this encounter.   Face-to-face time spent with patient was *** minutes. 50% of time was spent in counseling and coordination of care.  Follow-Up Instructions: No Follow-up on file.   Earnestine Mealing, NT  Note - This record has been created using Editor, commissioning.  Chart creation errors have been sought, but may not always  have been located. Such creation errors do not reflect on  the standard of medical care.

## 2017-08-27 DIAGNOSIS — F438 Other reactions to severe stress: Secondary | ICD-10-CM | POA: Diagnosis not present

## 2017-08-29 DIAGNOSIS — F438 Other reactions to severe stress: Secondary | ICD-10-CM | POA: Diagnosis not present

## 2017-08-31 ENCOUNTER — Ambulatory Visit: Attending: Internal Medicine | Admitting: Physical Therapy

## 2017-08-31 ENCOUNTER — Ambulatory Visit: Payer: Medicare Other | Admitting: Rheumatology

## 2017-08-31 DIAGNOSIS — R2689 Other abnormalities of gait and mobility: Secondary | ICD-10-CM | POA: Diagnosis not present

## 2017-08-31 DIAGNOSIS — X58XXXS Exposure to other specified factors, sequela: Secondary | ICD-10-CM | POA: Insufficient documentation

## 2017-08-31 DIAGNOSIS — M25562 Pain in left knee: Secondary | ICD-10-CM | POA: Insufficient documentation

## 2017-08-31 DIAGNOSIS — M25561 Pain in right knee: Secondary | ICD-10-CM | POA: Insufficient documentation

## 2017-08-31 DIAGNOSIS — S76111S Strain of right quadriceps muscle, fascia and tendon, sequela: Secondary | ICD-10-CM | POA: Insufficient documentation

## 2017-09-03 ENCOUNTER — Encounter: Payer: Self-pay | Admitting: Physical Therapy

## 2017-09-03 ENCOUNTER — Ambulatory Visit: Admitting: Physical Therapy

## 2017-09-03 DIAGNOSIS — M25561 Pain in right knee: Secondary | ICD-10-CM | POA: Diagnosis not present

## 2017-09-03 DIAGNOSIS — S76111S Strain of right quadriceps muscle, fascia and tendon, sequela: Secondary | ICD-10-CM | POA: Diagnosis not present

## 2017-09-03 DIAGNOSIS — R2689 Other abnormalities of gait and mobility: Secondary | ICD-10-CM

## 2017-09-03 DIAGNOSIS — M25562 Pain in left knee: Secondary | ICD-10-CM | POA: Diagnosis not present

## 2017-09-03 NOTE — Therapy (Signed)
[place], [place], [address] Phone: [phone]   Fax:  [phone]  Physical Therapy Evaluation  Patient Details  Name: Jose [name] MRN: [id number] Date of Birth: [birth year] Referring Provider: Dr Meredith Pel   Encounter Date: 08/31/2017      PT End of Session - 09/03/17 1046    Visit Number 1   Number of Visits 16   Date for PT Re-Evaluation 10/29/17   Authorization Type Blue cross blue shield    PT Start Time 0845   PT Stop Time 0930   PT Time Calculation (min) 45 min   Activity Tolerance Patient tolerated treatment well   Behavior During Therapy [facility] for tasks assessed/performed      Past Medical History:  Diagnosis Date  . Arthritis    R shoulder, great toes, hands- has gout & has injections in knees with Dr. Estanislado Pandy   . Cancer ([place])    skin ca-   . GERD (gastroesophageal reflux disease)   . History of hiatal hernia   . SDH (subdural hematoma) (HCC)     Past Surgical History:  Procedure Laterality Date  . BRAIN SURGERY  2016   evacuation of SDH  . CARPAL TUNNEL RELEASE Bilateral   . GREEN LIGHT LASER TURP (TRANSURETHRAL RESECTION OF PROSTATE  04/27/2017  . QUADRICEPS TENDON REPAIR Bilateral 06/15/2017   Procedure: REPAIR QUADRICEP TENDON;  Surgeon: Meredith Pel, MD;  Location: [place];  Service: Orthopedics;  Laterality: Bilateral;  . SHOULDER SURGERY Right     There were no vitals filed for this visit.       Subjective Assessment - 09/03/17 1254    Subjective Patient had a left quad tear. He was holding off on surgery. His left quad gave way and he fell tearing his right side. He had both repaired on 06/15/2017. He reports most of the pain is at night. He has some increased pain in standing. he was active prior to his surgery.    Limitations Lifting   How long can you stand comfortably? < 20 minutes    Diagnostic tests nothing post op    Currently in Pain? Yes   Pain Score 8    Pain Location Knee   Pain Orientation Left   Pain Descriptors / Indicators Aching   Pain Type Surgical pain   Pain Onset More than a month ago   Pain Frequency Constant   Aggravating Factors  standing, walking, night time    Pain Relieving Factors ice    Pain Score 8  at worst    Pain Location Knee   Pain Orientation Right   Pain Descriptors / Indicators Aching   Pain Type Surgical pain   Pain Onset More than a month ago   Pain Frequency Constant   Aggravating Factors  standing, sitting for long periods of time    Pain Relieving Factors ice, movement             [facility] PT Assessment - 09/03/17 0001      Assessment   Medical Diagnosis Bilateral quad tear and repair    Referring Provider Dr Meredith Pel    Onset Date/Surgical Date 06/15/17   Hand Dominance Right   Next MD Visit 10/18/2017    Prior Therapy [facility]      Precautions   Precautions None     Restrictions   Weight Bearing Restrictions No     Balance Screen   Has the  patient fallen in the past 6 months No   Has the patient had a decrease in activity level because of a fear of falling?  No   Is the patient reluctant to leave their home because of a fear of falling?  No     Home Environment   Additional Comments 17 steps inside the house. Walking up and down stairs      Prior Function   Level of Independence Independent   Vocation Full time employment   Vocation Requirements Has to drive    Leisure Going to the gym; Golf      Cognition   Overall Cognitive Status Within Functional Limits for tasks assessed   Attention Focused   Focused Attention Appears intact   Memory Appears intact   Awareness Appears intact   Problem Solving Appears intact     Observation/Other Assessments   Observations wearing brace on the left    Focus on Therapeutic Outcomes (FOTO)  43%      Sensation   Light Touch Appears Intact   Additional Comments denies parathesias       Coordination   Gross Motor Movements are Fluid and Coordinated Yes   Fine Motor Movements are Fluid and Coordinated Yes     Posture/Postural Control   Posture/Postural Control No significant limitations     AROM   Right Knee Extension 0   Right Knee Flexion 130   Left Knee Extension 123     PROM   Right Knee Extension 0   Right Knee Flexion 130   Left Knee Extension 0   Left Knee Flexion 127     Strength   Right Hip Flexion 4+/5   Right Hip ABduction 4+/5   Right Hip ADduction 4+/5   Left Hip Flexion 4/5   Left Hip ABduction 4/5   Left Hip ADduction 4/5   Right Knee Flexion 4+/5   Right Knee Extension 4+/5   Left Knee Flexion 4+/5   Left Knee Extension 4+/5     Palpation   Palpation comment No tenderness to palpation      Ambulation/Gait   Gait Comments increased hip abduction bulateral.Latetral trunk perterbations with gait.             Objective measurements completed on examination: See above findings.          [place] Adult PT Treatment/Exercise - 09/03/17 0001      Knee/Hip Exercises: Standing   Other Standing Knee Exercises 2x10 standing      Knee/Hip Exercises: Seated   Long Arc Quad Limitations yellow band 2x10    Knee/Hip Flexion knee flexion 2x10 yellow      Knee/Hip Exercises: Supine   Bridges Limitations 2x10    Straight Leg Raises Limitations 2x10                 PT Education - 09/03/17 1045    Education provided Yes   Education Details HEP; symptom managament    Person(s) Educated Patient   Methods Explanation;Demonstration;Tactile cues;Verbal cues;Handout   Comprehension Verbalized understanding;Returned demonstration;Verbal cues required;Tactile cues required;Need further instruction          PT Short Term Goals - 09/03/17 1055      PT SHORT TERM GOAL #1   Title Patient will increase bilateral lower extremity strength to 5/5    Time 4   Period Weeks   Status New     PT SHORT TERM GOAL #2   Title Patient will be  independent with inital  HEP    Time 4   Period Weeks   Status New     PT SHORT TERM GOAL #3   Title Patient will demsotrate 20 second single leg stance bilateral    Time 4   Period Weeks   Status New           PT Long Term Goals - 09/03/17 1058      PT LONG TERM GOAL #1   Title Patient will walk 1 moile without self report of pain    Time 8   Period Weeks   Status New     PT LONG TERM GOAL #2   Title Patient will demsotrate a 30% limitation on FOTO    Time 8   Period Weeks   Status New     PT LONG TERM GOAL #3   Title Patient will sleep through the night without increased pain    Time 8   Period Weeks   Status New     PT LONG TERM GOAL #4   Title Patient will stand for 1 hour without increased pain in order to perfrom daily tasks   Time 8   Period Weeks   Status New                Plan - 09/03/17 1047    Clinical Impression Statement Patient is a 66 year old male S/P bilateral quad repairs. His left is weaker then the right. He has increased pain when he sits and stiffnes up or when he stands. He also has pain at night. His motion is full. He has strength deficits L > R. He was very active prior to surgery. He would benefit from skilled therapy to progress strength and progress back to an active lifestyle.    History and Personal Factors relevant to plan of care: multi joint arthirtis    Clinical Presentation Stable   Clinical Decision Making Low   PT Frequency 2x / week   PT Duration 8 weeks   PT Treatment/Interventions ADLs/Self Care Home Management;Cryotherapy;Electrical Stimulation;Iontophoresis 4mg /ml Dexamethasone;Stair training;Gait training;Moist Heat;Therapeutic activities;Therapeutic exercise;Neuromuscular re-education;Patient/family education;Passive range of motion;Manual techniques;Taping;Ultrasound   PT Next Visit Plan advance patient as tolerated. Continue to review the importance of symptom management and activity progression. Nu-step; heel  raises; step up 4 inch; leg press; SAQ; hip abduction; hip extension    PT Home Exercise Plan SLR; bridge, LAQ yellow; hamstring yellow; standing march    Consulted and Agree with Plan of Care Patient      Patient will benefit from skilled therapeutic intervention in order to improve the following deficits and impairments:  Abnormal gait, Decreased range of motion, Pain, Decreased strength, Decreased activity tolerance, Impaired UE functional use, Decreased knowledge of use of DME  Visit Diagnosis: Acute pain of left knee - Plan: PT plan of care cert/re-cert  Acute pain of right knee - Plan: PT plan of care cert/re-cert  Other abnormalities of gait and mobility - Plan: PT plan of care cert/re-cert      G-Codes - 34/74/25 1127    Functional Assessment Tool Used (Outpatient Only) foto, clinical decision making    Functional Limitation Mobility: Walking and moving around   Mobility: Walking and Moving Around Current Status (Z5638) At least 20 percent but less than 40 percent impaired, limited or restricted   Mobility: Walking and Moving Around Goal Status (V5643) At least 1 percent but less than 20 percent impaired, limited or restricted       Problem List  Patient Active Problem List   Diagnosis Date Noted  . Candidiasis   . Abnormality of gait   . Hypoalbuminemia due to protein-calorie malnutrition ([place])   . History of gout   . Sleep disturbance   . Constipation   . Rupture of quadriceps tendon, right, sequela 06/19/2017  . Quadriceps tendon rupture, left, sequela 06/19/2017  . Acute blood loss anemia 06/19/2017  . Fall   . History of subdural hematoma   . Post-operative pain   . Recurrent falls   . Quadriceps tendon rupture 06/15/2017  . Primary osteoarthritis of both knees 02/28/2017  . Primary osteoarthritis of both hands 02/28/2017  . Uricacidemia 02/28/2017  . Pain in joint of left knee 02/07/2017  . Idiopathic chronic gout, unspecified site, without tophus (tophi)  11/29/2016  . HEMATURIA UNSPECIFIED 01/25/2009  . ABDOMINAL PAIN 01/25/2009  . XERODERMA 03/10/2008  . UNS ADVRS EFF UNS RX MEDICINAL&BIOLOGICAL SBSTNC 03/10/2008  . ADJUSTMENT REACTION WITH PHYSICAL SYMPTOMS 02/14/2008  . MUSCLE SPASM 02/14/2008  . ACUTE PROSTATITIS 12/16/2007  . BREAST LUMP OR MASS, RIGHT 07/12/2007  . H/O: RCT (rotator cuff tear) 01/13/2007  . GOUT 01/08/2007    Carney Living  PT DPT  09/03/2017, 12:56 PM  [facility] [address] [place], [place], [address] Phone: [phone]   Fax:  [phone]  Name: Jose [name] MRN: [id number] Date of Birth: 04-Oct-1951

## 2017-09-04 ENCOUNTER — Ambulatory Visit (INDEPENDENT_AMBULATORY_CARE_PROVIDER_SITE_OTHER): Payer: Medicare Other | Admitting: Rheumatology

## 2017-09-04 ENCOUNTER — Encounter: Payer: Self-pay | Admitting: Rheumatology

## 2017-09-04 VITALS — BP 130/74 | HR 78 | Resp 18 | Ht 72.0 in | Wt 194.0 lb

## 2017-09-04 DIAGNOSIS — F438 Other reactions to severe stress: Secondary | ICD-10-CM | POA: Diagnosis not present

## 2017-09-04 DIAGNOSIS — M19042 Primary osteoarthritis, left hand: Secondary | ICD-10-CM

## 2017-09-04 DIAGNOSIS — M1A09X0 Idiopathic chronic gout, multiple sites, without tophus (tophi): Secondary | ICD-10-CM

## 2017-09-04 DIAGNOSIS — M170 Bilateral primary osteoarthritis of knee: Secondary | ICD-10-CM

## 2017-09-04 DIAGNOSIS — R739 Hyperglycemia, unspecified: Secondary | ICD-10-CM

## 2017-09-04 DIAGNOSIS — E790 Hyperuricemia without signs of inflammatory arthritis and tophaceous disease: Secondary | ICD-10-CM

## 2017-09-04 DIAGNOSIS — M19041 Primary osteoarthritis, right hand: Secondary | ICD-10-CM

## 2017-09-04 MED ORDER — ALLOPURINOL 300 MG PO TABS: 300.0000 mg | ORAL_TABLET | Freq: Every day | ORAL | 1 refills | Status: DC

## 2017-09-04 NOTE — Therapy (Signed)
[place], [place], [address] Phone: [phone]   Fax:  [phone]  Physical Therapy Treatment  Patient Details  Name: Jose [name] MRN: [id number] Date of Birth: 04/12/1951 Referring Provider: Dr Meredith Pel   Encounter Date: 09/03/2017      PT End of Session - 09/03/17 1438    Visit Number 2   Number of Visits 16   Date for PT Re-Evaluation 10/29/17   Authorization Type Blue cross blue shield    PT Start Time 1417   PT Stop Time 1500   PT Time Calculation (min) 43 min   Activity Tolerance Patient tolerated treatment well   Behavior During Therapy [facility] for tasks assessed/performed      Past Medical History:  Diagnosis Date  . Arthritis    R shoulder, great toes, hands- has gout & has injections in knees with Dr. Estanislado Pandy   . Cancer ([place])    skin ca-   . GERD (gastroesophageal reflux disease)   . History of hiatal hernia   . SDH (subdural hematoma) (HCC)     Past Surgical History:  Procedure Laterality Date  . BRAIN SURGERY  2016   evacuation of SDH  . CARPAL TUNNEL RELEASE Bilateral   . GREEN LIGHT LASER TURP (TRANSURETHRAL RESECTION OF PROSTATE  04/27/2017  . QUADRICEPS TENDON REPAIR Bilateral 06/15/2017   Procedure: REPAIR QUADRICEP TENDON;  Surgeon: Meredith Pel, MD;  Location: [place];  Service: Orthopedics;  Laterality: Bilateral;  . SHOULDER SURGERY Right     There were no vitals filed for this visit.      Subjective Assessment - 09/04/17 0855    Subjective No significant increase in pain after the last treatment. He has been working on his exercises at home. He continues to have pain at night in the left knee.    Limitations Lifting   How long can you stand comfortably? < 20 minutes    Diagnostic tests nothing post op    Currently in Pain? Yes   Pain Score 3    Pain Location Knee   Pain Orientation Left   Pain Descriptors / Indicators Aching   Pain Type  Surgical pain   Pain Onset More than a month ago   Pain Frequency Constant   Aggravating Factors  standing    Pain Relieving Factors ice    Pain Score 2   Pain Location Knee   Pain Orientation Right   Pain Descriptors / Indicators Aching   Pain Type Surgical pain   Pain Onset More than a month ago   Pain Frequency Constant   Aggravating Factors  standing and sitting for a long period of time    Pain Relieving Factors ice and movement                          [facility] Adult PT Treatment/Exercise - 09/04/17 0001      Knee/Hip Exercises: Standing   Forward Step Up Limitations 4 inch x10 each leg    Step Down Limitations 4 inch x10 each   Other Standing Knee Exercises 2x10 standing      Knee/Hip Exercises: Seated   Long Arc Quad Limitations red band 2x10    Knee/Hip Flexion red knee flexion 2x10 yellow      Knee/Hip Exercises: Supine   Short Arc Quad Sets Limitations SAQ 1 lb 2x10    Bridges Limitations 2x10    Straight Leg Raises  Limitations 2x10 1 lb                 PT Education - 09/25/2017 1437    Education provided Yes   Education Details reviewed activity progression and HEP    Person(s) Educated Patient   Methods Explanation;Demonstration;Tactile cues;Verbal cues   Comprehension Verbalized understanding;Returned demonstration          PT Short Term Goals - September 25, 2017 1055      PT SHORT TERM GOAL #1   Title Patient will increase bilateral lower extremity strength to 5/5    Time 4   Period Weeks   Status New     PT SHORT TERM GOAL #2   Title Patient will be independent with inital HEP    Time 4   Period Weeks   Status New     PT SHORT TERM GOAL #3   Title Patient will demsotrate 20 second single leg stance bilateral    Time 4   Period Weeks   Status New           PT Long Term Goals - 2017/09/25 1058      PT LONG TERM GOAL #1   Title Patient will walk 1 moile without self report of pain    Time 8   Period Weeks   Status New      PT LONG TERM GOAL #2   Title Patient will demsotrate a 30% limitation on FOTO    Time 8   Period Weeks   Status New     PT LONG TERM GOAL #3   Title Patient will sleep through the night without increased pain    Time 8   Period Weeks   Status New     PT LONG TERM GOAL #4   Title Patient will stand for 1 hour without increased pain in order to perfrom daily tasks   Time 8   Period Weeks   Status New               Plan - 09/04/17 0917    Clinical Impression Statement Patient is making good progress. Therapy was able to add leg press and low step without increased pain. he was advised to continue exercises at home. Therapy added heel raise to HEP.    PT Frequency 2x / week   PT Duration 8 weeks   PT Treatment/Interventions ADLs/Self Care Home Management;Cryotherapy;Electrical Stimulation;Iontophoresis 4mg /ml Dexamethasone;Stair training;Gait training;Moist Heat;Therapeutic activities;Therapeutic exercise;Neuromuscular re-education;Patient/family education;Passive range of motion;Manual techniques;Taping;Ultrasound   PT Next Visit Plan continue with steps; consider standing hip exercises; progress weights and bands as tolerated.    PT Home Exercise Plan SLR; bridge, LAQ yellow; hamstring yellow; standing march    Consulted and Agree with Plan of Care Patient      Patient will benefit from skilled therapeutic intervention in order to improve the following deficits and impairments:  Abnormal gait, Decreased range of motion, Pain, Decreased strength, Decreased activity tolerance, Impaired UE functional use, Decreased knowledge of use of DME  Visit Diagnosis: Acute pain of left knee  Acute pain of right knee  Other abnormalities of gait and mobility       G-Codes - 2017-09-25 1127    Functional Assessment Tool Used (Outpatient Only) foto, clinical decision making    Functional Limitation Mobility: Walking and moving around   Mobility: Walking and Moving Around Current  Status (B7628) At least 20 percent but less than 40 percent impaired, limited or restricted   Mobility: Walking and Moving  Around Goal Status [phone]) At least 1 percent but less than 20 percent impaired, limited or restricted      Problem List Patient Active Problem List   Diagnosis Date Noted  . Candidiasis   . Abnormality of gait   . Hypoalbuminemia due to protein-calorie malnutrition ([place])   . History of gout   . Sleep disturbance   . Constipation   . Rupture of quadriceps tendon, right, sequela 06/19/2017  . Quadriceps tendon rupture, left, sequela 06/19/2017  . Acute blood loss anemia 06/19/2017  . Fall   . History of subdural hematoma   . Post-operative pain   . Recurrent falls   . Quadriceps tendon rupture 06/15/2017  . Primary osteoarthritis of both knees 02/28/2017  . Primary osteoarthritis of both hands 02/28/2017  . Uricacidemia 02/28/2017  . Pain in joint of left knee 02/07/2017  . Idiopathic chronic gout, unspecified site, without tophus (tophi) 11/29/2016  . HEMATURIA UNSPECIFIED 01/25/2009  . ABDOMINAL PAIN 01/25/2009  . XERODERMA 03/10/2008  . UNS ADVRS EFF UNS RX MEDICINAL&BIOLOGICAL SBSTNC 03/10/2008  . ADJUSTMENT REACTION WITH PHYSICAL SYMPTOMS 02/14/2008  . MUSCLE SPASM 02/14/2008  . ACUTE PROSTATITIS 12/16/2007  . BREAST LUMP OR MASS, RIGHT 07/12/2007  . H/O: RCT (rotator cuff tear) 01/13/2007  . GOUT 01/08/2007    Carney Living PT DPT  09/04/2017, 9:59 AM  [facility] [address] [place], [place], [address] Phone: [phone]   Fax:  [phone]  Name: Jose [name] MRN: [id number] Date of Birth: August 22, 1951

## 2017-09-04 NOTE — Progress Notes (Signed)
Office Visit Note  Patient: Jose [name]             Date of Birth: 06-28-1951           MRN: [id number]             PCP: [name], MD Referring: [name], MD Visit Date: 09/04/2017 Occupation: _0 @    Subjective:  Medication Management (has torn both quadriceps )  History of Present Illness: Jose [name] is a 66 y.o. male  Last seen in our office on 03/07/2017 for osteoarthritis of the knees (received Euflexxa 2 bilateral knees).  Patient also reports that he had a quadricep rupture recently. Please see Epic for full details. (pt saw dr. Nicki Reaper dean around may 2018). Hx:  Slipped on wet floor at [employer] across from [employer]. On June 29, pt's left leg gave out while getting dressed and when he caught him self on the right leg, the right leg also gave out and sustained a tear to the right quadriceps (and left quadriceps was already torn in may -- and awaiting surgery). Pt had surgery done and did physical therapy and has done well.   Pt's gout is doing well. No flare. Using allopurinol as rx'd.   Activities of Daily Living:  Patient reports morning stiffness for 15 minutes.   Patient Denies nocturnal pain.  Difficulty dressing/grooming: Denies Difficulty climbing stairs: Denies Difficulty getting out of chair: Denies Difficulty using hands for taps, buttons, cutlery, and/or writing: Denies   Review of Systems  Constitutional: Negative for fatigue.  HENT: Negative for mouth sores and mouth dryness.   Eyes: Negative for dryness.  Respiratory: Negative for shortness of breath.   Gastrointestinal: Negative for constipation and diarrhea.  Musculoskeletal: Negative for myalgias and myalgias.  Skin: Negative for sensitivity to sunlight.  Neurological: Negative for memory loss.  Psychiatric/Behavioral: Negative for sleep disturbance.    PMFS History:  Patient Active Problem List   Diagnosis Date Noted  . Candidiasis   . Abnormality of  gait   . Hypoalbuminemia due to protein-calorie malnutrition ([place])   . History of gout   . Sleep disturbance   . Constipation   . Rupture of quadriceps tendon, right, sequela 06/19/2017  . Quadriceps tendon rupture, left, sequela 06/19/2017  . Acute blood loss anemia 06/19/2017  . Fall   . History of subdural hematoma   . Post-operative pain   . Recurrent falls   . Quadriceps tendon rupture 06/15/2017  . Primary osteoarthritis of both knees 02/28/2017  . Primary osteoarthritis of both hands 02/28/2017  . Uricacidemia 02/28/2017  . Pain in joint of left knee 02/07/2017  . Idiopathic chronic gout, unspecified site, without tophus (tophi) 11/29/2016  . HEMATURIA UNSPECIFIED 01/25/2009  . ABDOMINAL PAIN 01/25/2009  . XERODERMA 03/10/2008  . UNS ADVRS EFF UNS RX MEDICINAL&BIOLOGICAL SBSTNC 03/10/2008  . ADJUSTMENT REACTION WITH PHYSICAL SYMPTOMS 02/14/2008  . MUSCLE SPASM 02/14/2008  . ACUTE PROSTATITIS 12/16/2007  . BREAST LUMP OR MASS, RIGHT 07/12/2007  . H/O: RCT (rotator cuff tear) 01/13/2007  . GOUT 01/08/2007    Past Medical History:  Diagnosis Date  . Arthritis    R shoulder, great toes, hands- has gout & has injections in knees with Dr. Estanislado Pandy   . Cancer ([place])    skin ca-   . GERD (gastroesophageal reflux disease)   . History of hiatal hernia   . SDH (subdural hematoma) (HCC)     Family History  Problem Relation Age  of Onset  . Parkinson's disease Mother   . Heart disease Father   . Parkinson's disease Brother   . Diabetes Brother    Past Surgical History:  Procedure Laterality Date  . BRAIN SURGERY  2016   evacuation of SDH  . CARPAL TUNNEL RELEASE Bilateral   . GREEN LIGHT LASER TURP (TRANSURETHRAL RESECTION OF PROSTATE  04/27/2017  . QUADRICEPS TENDON REPAIR Bilateral 06/15/2017   Procedure: REPAIR QUADRICEP TENDON;  Surgeon: Meredith Pel, MD;  Location: [facility];  Service: Orthopedics;  Laterality: Bilateral;  . SHOULDER SURGERY Right    Social  History   Social History Narrative  . No narrative on file     Objective: Vital Signs: BP 130/74   Pulse 78   Resp 18   Ht 6' (1.829 m)   Wt 194 lb (88 kg)   BMI 26.31 kg/m    Physical Exam  Constitutional: He is oriented to person, place, and time. He appears well-developed and well-nourished.  HENT:  Head: Normocephalic and atraumatic.  Eyes: Pupils are equal, round, and reactive to light. Conjunctivae and EOM are normal.  Neck: Normal range of motion. Neck supple.  Cardiovascular: Normal rate, regular rhythm and normal heart sounds.  Exam reveals no gallop and no friction rub.   No murmur heard. Pulmonary/Chest: Effort normal and breath sounds normal. No respiratory distress. He has no wheezes. He has no rales. He exhibits no tenderness.  Abdominal: Soft. He exhibits no distension and no mass. There is no tenderness. There is no guarding.  Musculoskeletal: Normal range of motion.  Lymphadenopathy:    He has no cervical adenopathy.  Neurological: He is alert and oriented to person, place, and time. He exhibits normal muscle tone. Coordination normal.  Skin: Skin is warm and dry. Capillary refill takes less than 2 seconds. No rash noted.  Psychiatric: He has a normal mood and affect. His behavior is normal. Judgment and thought content normal.  Vitals reviewed.    Musculoskeletal Exam:  Full range of motion of all joints except decreased range of motion of bilateral shoulder joint with left worse than right (history of left rotator cuff tear). Grip strength is equal and strong bilaterally Fiber myalgia tender points are absent  CDAI Exam: No CDAI exam completed.  No synovitis on examination  Investigation: No additional findings. Admission on 06/19/2017, Discharged on 06/27/2017  Component Date Value Ref Range Status  . WBC 06/20/2017 7.8  4.0 - 10.5 K/uL Final  . RBC 06/20/2017 3.95* 4.22 - 5.81 MIL/uL Final  . Hemoglobin 06/20/2017 12.4* 13.0 - 17.0 g/dL Final  .  HCT 06/20/2017 37.4* 39.0 - 52.0 % Final  . MCV 06/20/2017 94.7  78.0 - 100.0 fL Final  . MCH 06/20/2017 31.4  26.0 - 34.0 pg Final  . MCHC 06/20/2017 33.2  30.0 - 36.0 g/dL Final  . RDW 06/20/2017 13.4  11.5 - 15.5 % Final  . Platelets 06/20/2017 231  150 - 400 K/uL Final  . Neutrophils Relative % 06/20/2017 66  % Final  . Neutro Abs 06/20/2017 5.2  1.7 - 7.7 K/uL Final  . Lymphocytes Relative 06/20/2017 17  % Final  . Lymphs Abs 06/20/2017 1.3  0.7 - 4.0 K/uL Final  . Monocytes Relative 06/20/2017 14  % Final  . Monocytes Absolute 06/20/2017 1.1* 0.1 - 1.0 K/uL Final  . Eosinophils Relative 06/20/2017 2  % Final  . Eosinophils Absolute 06/20/2017 0.2  0.0 - 0.7 K/uL Final  . Basophils Relative 06/20/2017 1  %  Final  . Basophils Absolute 06/20/2017 0.1  0.0 - 0.1 K/uL Final  . Sodium 06/20/2017 140  135 - 145 mmol/L Final  . Potassium 06/20/2017 3.8  3.5 - 5.1 mmol/L Final  . Chloride 06/20/2017 104  101 - 111 mmol/L Final  . CO2 06/20/2017 28  22 - 32 mmol/L Final  . Glucose, Bld 06/20/2017 108* 65 - 99 mg/dL Final  . BUN 06/20/2017 12  6 - 20 mg/dL Final  . Creatinine, Ser 06/20/2017 1.00  0.61 - 1.24 mg/dL Final  . Calcium 06/20/2017 8.7* 8.9 - 10.3 mg/dL Final  . Total Protein 06/20/2017 5.7* 6.5 - 8.1 g/dL Final  . Albumin 06/20/2017 2.6* 3.5 - 5.0 g/dL Final  . AST 06/20/2017 24  15 - 41 U/L Final  . ALT 06/20/2017 23  17 - 63 U/L Final  . Alkaline Phosphatase 06/20/2017 48  38 - 126 U/L Final  . Total Bilirubin 06/20/2017 0.6  0.3 - 1.2 mg/dL Final  . GFR calc non Af Amer 06/20/2017 >60  >60 mL/min Final  . GFR calc Af Amer 06/20/2017 >60  >60 mL/min Final   Comment: (NOTE) The eGFR has been calculated using the CKD EPI equation. This calculation has not been validated in all clinical situations. eGFR's persistently <60 mL/min signify possible Chronic Kidney Disease.   . Anion gap 06/20/2017 8  5 - 15 Final  . Sodium 06/26/2017 139  135 - 145 mmol/L Final  . Potassium  06/26/2017 3.8  3.5 - 5.1 mmol/L Final  . Chloride 06/26/2017 106  101 - 111 mmol/L Final  . CO2 06/26/2017 26  22 - 32 mmol/L Final  . Glucose, Bld 06/26/2017 95  65 - 99 mg/dL Final  . BUN 06/26/2017 21* 6 - 20 mg/dL Final  . Creatinine, Ser 06/26/2017 0.99  0.61 - 1.24 mg/dL Final  . Calcium 06/26/2017 8.8* 8.9 - 10.3 mg/dL Final  . GFR calc non Af Amer 06/26/2017 >60  >60 mL/min Final  . GFR calc Af Amer 06/26/2017 >60  >60 mL/min Final   Comment: (NOTE) The eGFR has been calculated using the CKD EPI equation. This calculation has not been validated in all clinical situations. eGFR's persistently <60 mL/min signify possible Chronic Kidney Disease.   . Anion gap 06/26/2017 7  5 - 15 Final  . WBC 06/26/2017 7.2  4.0 - 10.5 K/uL Final  . RBC 06/26/2017 3.95* 4.22 - 5.81 MIL/uL Final  . Hemoglobin 06/26/2017 12.4* 13.0 - 17.0 g/dL Final  . HCT 06/26/2017 38.0* 39.0 - 52.0 % Final  . MCV 06/26/2017 96.2  78.0 - 100.0 fL Final  . MCH 06/26/2017 31.4  26.0 - 34.0 pg Final  . MCHC 06/26/2017 32.6  30.0 - 36.0 g/dL Final  . RDW 06/26/2017 13.2  11.5 - 15.5 % Final  . Platelets 06/26/2017 286  150 - 400 K/uL Final  Admission on 06/15/2017, Discharged on 06/19/2017  Component Date Value Ref Range Status  . Sodium 06/15/2017 139  135 - 145 mmol/L Final  . Potassium 06/15/2017 3.5  3.5 - 5.1 mmol/L Final  . Chloride 06/15/2017 104  101 - 111 mmol/L Final  . CO2 06/15/2017 25  22 - 32 mmol/L Final  . Glucose, Bld 06/15/2017 99  65 - 99 mg/dL Final  . BUN 06/15/2017 11  6 - 20 mg/dL Final  . Creatinine, Ser 06/15/2017 1.01  0.61 - 1.24 mg/dL Final  . Calcium 06/15/2017 9.2  8.9 - 10.3 mg/dL Final  . Total  Protein 06/15/2017 6.9  6.5 - 8.1 g/dL Final  . Albumin 06/15/2017 4.0  3.5 - 5.0 g/dL Final  . AST 06/15/2017 29  15 - 41 U/L Final  . ALT 06/15/2017 32  17 - 63 U/L Final  . Alkaline Phosphatase 06/15/2017 56  38 - 126 U/L Final  . Total Bilirubin 06/15/2017 0.6  0.3 - 1.2 mg/dL Final   . GFR calc non Af Amer 06/15/2017 >60  >60 mL/min Final  . GFR calc Af Amer 06/15/2017 >60  >60 mL/min Final   Comment: (NOTE) The eGFR has been calculated using the CKD EPI equation. This calculation has not been validated in all clinical situations. eGFR's persistently <60 mL/min signify possible Chronic Kidney Disease.   . Anion gap 06/15/2017 10  5 - 15 Final  . WBC 06/15/2017 9.3  4.0 - 10.5 K/uL Final  . RBC 06/15/2017 4.83  4.22 - 5.81 MIL/uL Final  . Hemoglobin 06/15/2017 15.3  13.0 - 17.0 g/dL Final  . HCT 06/15/2017 45.2  39.0 - 52.0 % Final  . MCV 06/15/2017 93.6  78.0 - 100.0 fL Final  . MCH 06/15/2017 31.7  26.0 - 34.0 pg Final  . MCHC 06/15/2017 33.8  30.0 - 36.0 g/dL Final  . RDW 06/15/2017 13.7  11.5 - 15.5 % Final  . Platelets 06/15/2017 191  150 - 400 K/uL Final  Admission on 05/14/2017, Discharged on 05/14/2017  Component Date Value Ref Range Status  . Glucose, UA 05/14/2017 NEGATIVE  NEGATIVE mg/dL Final  . Bilirubin Urine 05/14/2017 SMALL* NEGATIVE Final  . Ketones, ur 05/14/2017 TRACE* NEGATIVE mg/dL Final  . Specific Gravity, Urine 05/14/2017 1.025  1.005 - 1.030 Final  . Hgb urine dipstick 05/14/2017 LARGE* NEGATIVE Final  . pH 05/14/2017 5.5  5.0 - 8.0 Final  . Protein, ur 05/14/2017 >=300* NEGATIVE mg/dL Final  . Urobilinogen, UA 05/14/2017 0.2  0.0 - 1.0 mg/dL Final  . Nitrite 05/14/2017 NEGATIVE  NEGATIVE Final  . Leukocytes, UA 05/14/2017 SMALL* NEGATIVE Final   Biochemical Testing Only. Please order routine urinalysis from main lab if confirmatory testing is needed.  Marland Kitchen Specimen Description 05/14/2017 URINE, CLEAN CATCH   Final  . Special Requests 05/14/2017 NONE   Final  . Culture 05/14/2017 NO GROWTH   Final  . Report Status 05/14/2017 05/15/2017 FINAL   Final  Admission on 05/10/2017, Discharged on 05/10/2017  Component Date Value Ref Range Status  . Color, Urine 05/10/2017 YELLOW  YELLOW Final  . APPearance 05/10/2017 CLOUDY* CLEAR Final  .  Specific Gravity, Urine 05/10/2017 1.019  1.005 - 1.030 Final  . pH 05/10/2017 5.0  5.0 - 8.0 Final  . Glucose, UA 05/10/2017 NEGATIVE  NEGATIVE mg/dL Final  . Hgb urine dipstick 05/10/2017 LARGE* NEGATIVE Final  . Bilirubin Urine 05/10/2017 NEGATIVE  NEGATIVE Final  . Ketones, ur 05/10/2017 5* NEGATIVE mg/dL Final  . Protein, ur 05/10/2017 100* NEGATIVE mg/dL Final  . Nitrite 05/10/2017 NEGATIVE  NEGATIVE Final  . Leukocytes, UA 05/10/2017 LARGE* NEGATIVE Final  . RBC / HPF 05/10/2017 TOO NUMEROUS TO COUNT  0 - 5 RBC/hpf Final  . WBC, UA 05/10/2017 TOO NUMEROUS TO COUNT  0 - 5 WBC/hpf Final  . Bacteria, UA 05/10/2017 RARE* NONE SEEN Final  . Squamous Epithelial / LPF 05/10/2017 0-5* NONE SEEN Final  . WBC Clumps 05/10/2017 PRESENT   Final  . Mucus 05/10/2017 PRESENT   Final  . Specimen Description 05/10/2017 URINE, RANDOM   Final  . Special Requests 05/10/2017 NONE  Final  . Culture 05/10/2017 <10,000 COLONIES/mL INSIGNIFICANT GROWTH*  Final  . Report Status 05/10/2017 05/12/2017 FINAL   Final     Imaging: No results found.  Speciality Comments: No specialty comments available.    Procedures:  No procedures performed Allergies: Patient has no known allergies.   Assessment / Plan:     Visit Diagnoses: Idiopathic chronic gout of multiple sites without tophus - Plan: Uric acid, CBC with Differential/Platelet, COMPLETE METABOLIC PANEL WITH GFR  Uricacidemia - Plan: Uric acid  Primary osteoarthritis of both knees  Primary osteoarthritis of both hands  Elevated serum glucose - Plan: Hemoglobin A1c   Plan: #1: History of gout. No flare. Doing well. Using allopurinol 3 mg daily.  #2: History of OA of bilateral knees. Has done well with Euflex in the past. Would like to start Euflex again in early 2019  #3: OA of bilateral hands. Doing well.  #4: Elevated serum glucose on past labs. Patient is requesting hemoglobin A1c.  #5: Recent history of bilateral quadriceps  tear. Please see Epic for full details.  #6: Refill allopurinol 300 mg daily; ninety-day supply with a refill  #7: CBC with differential, CMP with GFR, uric acid, hemoglobin A1c today   Orders: Orders Placed This Encounter  Procedures  . Uric acid  . CBC with Differential/Platelet  . COMPLETE METABOLIC PANEL WITH GFR  . Hemoglobin A1c   No orders of the defined types were placed in this encounter.   Face-to-face time spent with patient was 30 minutes. 50% of time was spent in counseling and coordination of care.  Follow-Up Instructions: No Follow-up on file.   Eliezer Lofts, PA-C  Note - This record has been created using [employer].  Chart creation errors have been sought, but may not always  have been located. Such creation errors do not reflect on  the standard of medical care.

## 2017-09-04 NOTE — Patient Instructions (Signed)
Ask about applying for visco (euflexxa if your insurance approves) on your feb 2019 visit. Continue allopurinol qd Use colchicine prn flares,  Discuss hgA1c RESULTS w/ your pcp

## 2017-09-05 LAB — COMPLETE METABOLIC PANEL WITH GFR
AG RATIO: 1.9 (calc) (ref 1.0–2.5)
ALKALINE PHOSPHATASE (APISO): 64 U/L (ref 40–115)
ALT: 17 U/L (ref 9–46)
AST: 14 U/L (ref 10–35)
Albumin: 4.1 g/dL (ref 3.6–5.1)
BILIRUBIN TOTAL: 0.2 mg/dL (ref 0.2–1.2)
BUN: 20 mg/dL (ref 7–25)
CHLORIDE: 106 mmol/L (ref 98–110)
CO2: 26 mmol/L (ref 20–32)
CREATININE: 1.08 mg/dL (ref 0.70–1.25)
Calcium: 9.3 mg/dL (ref 8.6–10.3)
GFR, Est African American: 82 mL/min/{1.73_m2} (ref >=60)
GFR, Est Non African American: 71 mL/min/{1.73_m2} (ref >=60)
GLOBULIN: 2.2 g/dL (ref 1.9–3.7)
Glucose, Bld: 76 mg/dL (ref 65–99)
POTASSIUM: 4.4 mmol/L (ref 3.5–5.3)
Sodium: 141 mmol/L (ref 135–146)
Total Protein: 6.3 g/dL (ref 6.1–8.1)

## 2017-09-05 LAB — CBC WITH DIFFERENTIAL/PLATELET
BASOS PCT: 0.9 %
Basophils Absolute: 62 cells/uL (ref 0–200)
Eosinophils Absolute: 104 cells/uL (ref 15–500)
Eosinophils Relative: 1.5 %
HEMATOCRIT: 43 % (ref 38.5–50.0)
Hemoglobin: 14.1 g/dL (ref 13.2–17.1)
LYMPHS ABS: 1311 {cells}/uL (ref 850–3900)
MCH: 29.9 pg (ref 27.0–33.0)
MCHC: 32.8 g/dL (ref 32.0–36.0)
MCV: 91.1 fL (ref 80.0–100.0)
MPV: 12.4 fL (ref 7.5–12.5)
Monocytes Relative: 10.4 %
NEUTROS PCT: 68.2 %
Neutro Abs: 4706 cells/uL (ref 1500–7800)
Platelets: 239 10*3/uL (ref 140–400)
RBC: 4.72 10*6/uL (ref 4.20–5.80)
RDW: 13.1 % (ref 11.0–15.0)
TOTAL LYMPHOCYTE: 19 %
WBC: 6.9 10*3/uL (ref 3.8–10.8)
WBCMIX: 718 {cells}/uL (ref 200–950)

## 2017-09-05 LAB — HEMOGLOBIN A1C
EAG (MMOL/L): 6.2 (calc)
Hgb A1c MFr Bld: 5.5 % of total Hgb (ref <5.7)
Mean Plasma Glucose: 111 (calc)

## 2017-09-05 LAB — URIC ACID: Uric Acid, Serum: 5 mg/dL (ref 4.0–8.0)

## 2017-09-07 ENCOUNTER — Ambulatory Visit: Admitting: Physical Therapy

## 2017-09-07 DIAGNOSIS — M25561 Pain in right knee: Secondary | ICD-10-CM | POA: Diagnosis not present

## 2017-09-07 DIAGNOSIS — R2689 Other abnormalities of gait and mobility: Secondary | ICD-10-CM

## 2017-09-07 DIAGNOSIS — M25562 Pain in left knee: Secondary | ICD-10-CM

## 2017-09-07 DIAGNOSIS — S76111S Strain of right quadriceps muscle, fascia and tendon, sequela: Secondary | ICD-10-CM | POA: Diagnosis not present

## 2017-09-07 NOTE — Therapy (Signed)
[place], [place], [address] Phone: [phone]   Fax:  [phone]  Physical Therapy Treatment  Patient Details  Name: Jose [name] MRN: [id number] Date of Birth: 05-27-1951 Referring Provider: Dr Meredith Pel   Encounter Date: 09/07/2017      PT End of Session - 09/07/17 0809    Visit Number 3   Number of Visits 16   Date for PT Re-Evaluation 10/29/17   Authorization Type Blue cross blue shield    PT Start Time 0800   PT Stop Time 0855   PT Time Calculation (min) 55 min      Past Medical History:  Diagnosis Date  . Arthritis    R shoulder, great toes, hands- has gout & has injections in knees with Dr. Estanislado Pandy   . Cancer ([place])    skin ca-   . GERD (gastroesophageal reflux disease)   . History of hiatal hernia   . SDH (subdural hematoma) (HCC)     Past Surgical History:  Procedure Laterality Date  . BRAIN SURGERY  2016   evacuation of SDH  . CARPAL TUNNEL RELEASE Bilateral   . GREEN LIGHT LASER TURP (TRANSURETHRAL RESECTION OF PROSTATE  04/27/2017  . QUADRICEPS TENDON REPAIR Bilateral 06/15/2017   Procedure: REPAIR QUADRICEP TENDON;  Surgeon: Meredith Pel, MD;  Location: [facility];  Service: Orthopedics;  Laterality: Bilateral;  . SHOULDER SURGERY Right     There were no vitals filed for this visit.      Subjective Assessment - 09/07/17 0809    Subjective Pain at the end of the day can be 8/10 in knees    Currently in Pain? No/denies                         [facility] Adult PT Treatment/Exercise - 09/07/17 0001      Knee/Hip Exercises: Stretches   Other Knee/Hip Stretches slant board stretch      Knee/Hip Exercises: Aerobic   Nustep L3 LE only x 5 minutes      Knee/Hip Exercises: Machines for Strengthening   Cybex Leg Press 1 plate 2 plates x 20 each      Knee/Hip Exercises: Standing   Heel Raises 20 reps   Knee Flexion 10 reps   Knee Flexion  Limitations each    Lateral Step Up Limitations 6 inch x 15 each    Forward Step Up Limitations 6 inch x 15 each      Knee/Hip Exercises: Seated   Long Arc Quad 10 reps;2 sets   Long Arc Quad Weight 3 lbs.   Knee/Hip Flexion red knee flexion 2x10      Knee/Hip Exercises: Supine   Bridges Limitations 2x10    Straight Leg Raises Limitations 2x10      Modalities   Modalities Cryotherapy     Cryotherapy   Number Minutes Cryotherapy 10 Minutes   Cryotherapy Location Knee  bilateral    Type of Cryotherapy Ice pack                  PT Short Term Goals - 09/03/17 1055      PT SHORT TERM GOAL #1   Title Patient will increase bilateral lower extremity strength to 5/5    Time 4   Period Weeks   Status New     PT SHORT TERM GOAL #2   Title Patient will be independent with inital HEP  Time 4   Period Weeks   Status New     PT SHORT TERM GOAL #3   Title Patient will demsotrate 20 second single leg stance bilateral    Time 4   Period Weeks   Status New           PT Long Term Goals - 09/03/17 1058      PT LONG TERM GOAL #1   Title Patient will walk 1 moile without self report of pain    Time 8   Period Weeks   Status New     PT LONG TERM GOAL #2   Title Patient will demsotrate a 30% limitation on FOTO    Time 8   Period Weeks   Status New     PT LONG TERM GOAL #3   Title Patient will sleep through the night without increased pain    Time 8   Period Weeks   Status New     PT LONG TERM GOAL #4   Title Patient will stand for 1 hour without increased pain in order to perfrom daily tasks   Time 8   Period Weeks   Status New               Plan - 09/07/17 9371    Clinical Impression Statement Continued leg press and step ups, increased to 6 inch. Began standing hamstring curls. Patella mobility decreased. Some pain with patella mobs. Quad lag present on left, cues for initial quad set with SLR. Ice post session.     PT Next Visit Plan  continue with steps; consider standing hip exercises; progress weights and bands as tolerated.    PT Home Exercise Plan SLR; bridge, LAQ yellow; hamstring yellow; standing march    Consulted and Agree with Plan of Care Patient      Patient will benefit from skilled therapeutic intervention in order to improve the following deficits and impairments:  Abnormal gait, Decreased range of motion, Pain, Decreased strength, Decreased activity tolerance, Impaired UE functional use, Decreased knowledge of use of DME  Visit Diagnosis: Acute pain of left knee  Acute pain of right knee  Other abnormalities of gait and mobility  Rupture of quadriceps tendon, right, sequela     Problem List Patient Active Problem List   Diagnosis Date Noted  . Candidiasis   . Abnormality of gait   . Hypoalbuminemia due to protein-calorie malnutrition ([place])   . History of gout   . Sleep disturbance   . Constipation   . Rupture of quadriceps tendon, right, sequela 06/19/2017  . Quadriceps tendon rupture, left, sequela 06/19/2017  . Acute blood loss anemia 06/19/2017  . Fall   . History of subdural hematoma   . Post-operative pain   . Recurrent falls   . Quadriceps tendon rupture 06/15/2017  . Primary osteoarthritis of both knees 02/28/2017  . Primary osteoarthritis of both hands 02/28/2017  . Uricacidemia 02/28/2017  . Pain in joint of left knee 02/07/2017  . Idiopathic chronic gout, unspecified site, without tophus (tophi) 11/29/2016  . HEMATURIA UNSPECIFIED 01/25/2009  . ABDOMINAL PAIN 01/25/2009  . XERODERMA 03/10/2008  . UNS ADVRS EFF UNS RX MEDICINAL&BIOLOGICAL SBSTNC 03/10/2008  . ADJUSTMENT REACTION WITH PHYSICAL SYMPTOMS 02/14/2008  . MUSCLE SPASM 02/14/2008  . ACUTE PROSTATITIS 12/16/2007  . BREAST LUMP OR MASS, RIGHT 07/12/2007  . H/O: RCT (rotator cuff tear) 01/13/2007  . GOUT 01/08/2007    Dorene Ar, PTA 09/07/2017, 9:02 AM  [facility]  [facility] [address] [place], [place], [address] Phone: [phone]   Fax:  [phone]  Name: Jose [name] MRN: [id number] Date of Birth: 1951-07-01

## 2017-09-10 ENCOUNTER — Ambulatory Visit: Admitting: Physical Therapy

## 2017-09-10 DIAGNOSIS — M25561 Pain in right knee: Secondary | ICD-10-CM | POA: Diagnosis not present

## 2017-09-10 DIAGNOSIS — R2689 Other abnormalities of gait and mobility: Secondary | ICD-10-CM

## 2017-09-10 DIAGNOSIS — S76111S Strain of right quadriceps muscle, fascia and tendon, sequela: Secondary | ICD-10-CM

## 2017-09-10 DIAGNOSIS — M25562 Pain in left knee: Secondary | ICD-10-CM

## 2017-09-10 NOTE — Therapy (Signed)
[place], [place], [address] Phone: [phone]   Fax:  [phone]  Physical Therapy Treatment  Patient Details  Name: Jose [name] MRN: [id number] Date of Birth: 03-12-1951 Referring Provider: Dr Meredith Pel   Encounter Date: 09/10/2017      PT End of Session - 09/10/17 0845    Visit Number 4   Number of Visits 16   Date for PT Re-Evaluation 10/29/17   Authorization Type Blue cross blue shield    PT Start Time 0843   PT Stop Time 0935   PT Time Calculation (min) 52 min      Past Medical History:  Diagnosis Date  . Arthritis    R shoulder, great toes, hands- has gout & has injections in knees with Dr. Estanislado Pandy   . Cancer ([place])    skin ca-   . GERD (gastroesophageal reflux disease)   . History of hiatal hernia   . SDH (subdural hematoma) (HCC)     Past Surgical History:  Procedure Laterality Date  . BRAIN SURGERY  2016   evacuation of SDH  . CARPAL TUNNEL RELEASE Bilateral   . GREEN LIGHT LASER TURP (TRANSURETHRAL RESECTION OF PROSTATE  04/27/2017  . QUADRICEPS TENDON REPAIR Bilateral 06/15/2017   Procedure: REPAIR QUADRICEP TENDON;  Surgeon: Meredith Pel, MD;  Location: [facility];  Service: Orthopedics;  Laterality: Bilateral;  . SHOULDER SURGERY Right     There were no vitals filed for this visit.      Subjective Assessment - 09/10/17 0844    Subjective I walked 4 miles yesterday and then did my exercises. Felt good afterward. No pain today.    Currently in Pain? No/denies                         [facility] Adult PT Treatment/Exercise - 09/10/17 0001      Knee/Hip Exercises: Stretches   Other Knee/Hip Stretches slant board stretch      Knee/Hip Exercises: Aerobic   Nustep L4 LE only x 5 minutes      Knee/Hip Exercises: Machines for Strengthening   Cybex Leg Press 2 plates, request to lower seat     Knee/Hip Exercises: Standing   Heel Raises 20 reps    Knee Flexion 10 reps   Knee Flexion Limitations each    Lateral Step Up Limitations 8 inch x 10 each   Forward Step Up Limitations 8 inch x 10 each    Other Standing Knee Exercises 3 way hip x 10 each way bilateral with 2 finger support for proprioception     Knee/Hip Exercises: Seated   Long Arc Quad 10 reps;2 sets   Long Arc Quad Weight 3 lbs.   Knee/Hip Flexion green knee flexion in doorway     Knee/Hip Exercises: Supine   Quad Sets 10 reps   Short Arc Quad Sets Limitations 3lb 20x 10   Bridges Limitations 2x10    Straight Leg Raises Limitations 2x10   with initial quad set                   PT Short Term Goals - 09/03/17 1055      PT SHORT TERM GOAL #1   Title Patient will increase bilateral lower extremity strength to 5/5    Time 4   Period Weeks   Status New     PT SHORT TERM GOAL #2   Title Patient will be  independent with inital HEP    Time 4   Period Weeks   Status New     PT SHORT TERM GOAL #3   Title Patient will demsotrate 20 second single leg stance bilateral    Time 4   Period Weeks   Status New           PT Long Term Goals - 09/03/17 1058      PT LONG TERM GOAL #1   Title Patient will walk 1 moile without self report of pain    Time 8   Period Weeks   Status New     PT LONG TERM GOAL #2   Title Patient will demsotrate a 30% limitation on FOTO    Time 8   Period Weeks   Status New     PT LONG TERM GOAL #3   Title Patient will sleep through the night without increased pain    Time 8   Period Weeks   Status New     PT LONG TERM GOAL #4   Title Patient will stand for 1 hour without increased pain in order to perfrom daily tasks   Time 8   Period Weeks   Status New               Plan - 09/10/17 2637    Clinical Impression Statement Able to walk 4 miles without increased pain yesterday. Working on step ups at home with some difficulty due to height of step. Did okay with 8 inch step in clinic today. SLS 5 sec best  on left. Began 3 way hip  to increase proprioception. Instructed pt in seated hamstring curls at doorway due to voiced difficulty using his other leg as anchor. Advised him not to use too low of a chair at home. Continues with quad lag on left.    PT Next Visit Plan continue with steps; consider standing hip exercises; progress weights and bands as tolerated.    PT Home Exercise Plan SLR; bridge, LAQ yellow; hamstring yellow; standing march    Consulted and Agree with Plan of Care Patient      Patient will benefit from skilled therapeutic intervention in order to improve the following deficits and impairments:  Abnormal gait, Decreased range of motion, Pain, Decreased strength, Decreased activity tolerance, Impaired UE functional use, Decreased knowledge of use of DME  Visit Diagnosis: Acute pain of right knee  Acute pain of left knee  Other abnormalities of gait and mobility  Rupture of quadriceps tendon, right, sequela     Problem List Patient Active Problem List   Diagnosis Date Noted  . Candidiasis   . Abnormality of gait   . Hypoalbuminemia due to protein-calorie malnutrition ([place])   . History of gout   . Sleep disturbance   . Constipation   . Rupture of quadriceps tendon, right, sequela 06/19/2017  . Quadriceps tendon rupture, left, sequela 06/19/2017  . Acute blood loss anemia 06/19/2017  . Fall   . History of subdural hematoma   . Post-operative pain   . Recurrent falls   . Quadriceps tendon rupture 06/15/2017  . Primary osteoarthritis of both knees 02/28/2017  . Primary osteoarthritis of both hands 02/28/2017  . Uricacidemia 02/28/2017  . Pain in joint of left knee 02/07/2017  . Idiopathic chronic gout, unspecified site, without tophus (tophi) 11/29/2016  . HEMATURIA UNSPECIFIED 01/25/2009  . ABDOMINAL PAIN 01/25/2009  . XERODERMA 03/10/2008  . UNS ADVRS EFF UNS RX MEDICINAL&BIOLOGICAL SBSTNC 03/10/2008  .  ADJUSTMENT REACTION WITH PHYSICAL SYMPTOMS 02/14/2008   . MUSCLE SPASM 02/14/2008  . ACUTE PROSTATITIS 12/16/2007  . BREAST LUMP OR MASS, RIGHT 07/12/2007  . H/O: RCT (rotator cuff tear) 01/13/2007  . GOUT 01/08/2007    Dorene Ar, PTA 09/10/2017, 12:09 PM  [place], [place], [address] Phone: [phone]   Fax:  [phone]  Name: KELE BARTHELEMY MRN: [id number] Date of Birth: Dec 01, 1951

## 2017-09-12 DIAGNOSIS — F438 Other reactions to severe stress: Secondary | ICD-10-CM | POA: Diagnosis not present

## 2017-09-13 ENCOUNTER — Ambulatory Visit: Admitting: Physical Therapy

## 2017-09-13 DIAGNOSIS — M25562 Pain in left knee: Secondary | ICD-10-CM

## 2017-09-13 DIAGNOSIS — M25561 Pain in right knee: Secondary | ICD-10-CM | POA: Diagnosis not present

## 2017-09-13 DIAGNOSIS — R2689 Other abnormalities of gait and mobility: Secondary | ICD-10-CM | POA: Diagnosis not present

## 2017-09-13 DIAGNOSIS — S76111S Strain of right quadriceps muscle, fascia and tendon, sequela: Secondary | ICD-10-CM | POA: Diagnosis not present

## 2017-09-13 NOTE — Therapy (Signed)
[place], [place], [address] Phone: [phone]   Fax:  [phone]  Physical Therapy Treatment  Patient Details  Name: Jose [name] MRN: [id number] Date of Birth: 06-01-1951 Referring Provider: Dr Meredith Pel   Encounter Date: 09/13/2017      PT End of Session - 09/13/17 1538    Visit Number 5   Number of Visits 16   Date for PT Re-Evaluation 10/29/17   Authorization Type Blue cross blue shield    PT Start Time 0932   PT Stop Time 1025   PT Time Calculation (min) 53 min   Activity Tolerance Patient tolerated treatment well   Behavior During Therapy [facility] for tasks assessed/performed      Past Medical History:  Diagnosis Date  . Arthritis    R shoulder, great toes, hands- has gout & has injections in knees with Dr. Estanislado Pandy   . Cancer ([place])    skin ca-   . GERD (gastroesophageal reflux disease)   . History of hiatal hernia   . SDH (subdural hematoma) (HCC)     Past Surgical History:  Procedure Laterality Date  . BRAIN SURGERY  2016   evacuation of SDH  . CARPAL TUNNEL RELEASE Bilateral   . GREEN LIGHT LASER TURP (TRANSURETHRAL RESECTION OF PROSTATE  04/27/2017  . QUADRICEPS TENDON REPAIR Bilateral 06/15/2017   Procedure: REPAIR QUADRICEP TENDON;  Surgeon: Meredith Pel, MD;  Location: [facility];  Service: Orthopedics;  Laterality: Bilateral;  . SHOULDER SURGERY Right     There were no vitals filed for this visit.      Subjective Assessment - 09/13/17 1536    Subjective Patients right knee is doing well. He is having pain in his left knee and feels like it is swelling significantly. He continues to do his exercises at home. He has been wearing his brace onthe left.    Limitations Lifting   How long can you stand comfortably? < 20 minutes    Diagnostic tests nothing post op    Currently in Pain? Yes   Pain Score 4    Pain Location Knee   Pain Orientation Left   Pain  Descriptors / Indicators Aching   Pain Type Surgical pain   Pain Onset More than a month ago   Pain Frequency Constant   Aggravating Factors  standing   Pain Relieving Factors ice                          [facility] Adult PT Treatment/Exercise - 09/13/17 0001      Knee/Hip Exercises: Stretches   Active Hamstring Stretch Limitations with strap 3x20 sec hold    Quad Stretch Limitations thomas stretch 3x20 sec hold    Other Knee/Hip Stretches slant board stretch      Knee/Hip Exercises: Aerobic   Nustep L4 LE only x 5 minutes      Knee/Hip Exercises: Standing   Forward Step Up Limitations 6 inch right 2x10 4 inch left x10      Knee/Hip Exercises: Supine   Quad Sets Limitations 3x10 bilateral    Short Arc Quad Sets Limitations 3lb 2x10 right no weight left    Bridges Limitations 2x10    Straight Leg Raises Limitations 2x10 right x10 left quad lag noted on the left      Modalities   Modalities Cryotherapy     Cryotherapy   Number Minutes Cryotherapy 10 Minutes  Cryotherapy Location Knee  bilateral    Type of Cryotherapy Ice pack                  PT Short Term Goals - 09/13/17 1539      PT SHORT TERM GOAL #1   Title Patient will increase bilateral lower extremity strength to 5/5    Baseline working on strengthening    Time 4   Period Weeks   Status On-going     PT SHORT TERM GOAL #2   Title Patient will be independent with inital HEP    Time 4   Period Weeks   Status On-going     PT SHORT TERM GOAL #3   Title Patient will demsotrate 20 second single leg stance bilateral    Time 4   Period Weeks   Status On-going           PT Long Term Goals - 09/03/17 1058      PT LONG TERM GOAL #1   Title Patient will walk 1 moile without self report of pain    Time 8   Period Weeks   Status New     PT LONG TERM GOAL #2   Title Patient will demsotrate a 30% limitation on FOTO    Time 8   Period Weeks   Status New     PT LONG TERM GOAL #3    Title Patient will sleep through the night without increased pain    Time 8   Period Weeks   Status New     PT LONG TERM GOAL #4   Title Patient will stand for 1 hour without increased pain in order to perfrom daily tasks   Time 8   Period Weeks   Status New               Plan - 09/13/17 1538    Clinical Impression Statement Therapy scaled back the work on his left leg but kept his right consistent. He was advised to see how his swelling and pain goes over the next few days on the left. Therapy will progress him as tolerated. Monitor quad lag on the left.    Clinical Presentation Stable   Clinical Decision Making Low   Rehab Potential Good   PT Frequency 2x / week   PT Duration 8 weeks   PT Treatment/Interventions ADLs/Self Care Home Management;Cryotherapy;Electrical Stimulation;Iontophoresis 4mg /ml Dexamethasone;Stair training;Gait training;Moist Heat;Therapeutic activities;Therapeutic exercise;Neuromuscular re-education;Patient/family education;Passive range of motion;Manual techniques;Taping;Ultrasound   PT Next Visit Plan continue with steps; consider standing hip exercises; progress weights and bands as tolerated.    PT Home Exercise Plan SLR; bridge, LAQ yellow; hamstring yellow; standing march    Consulted and Agree with Plan of Care Patient      Patient will benefit from skilled therapeutic intervention in order to improve the following deficits and impairments:  Abnormal gait, Decreased range of motion, Pain, Decreased strength, Decreased activity tolerance, Impaired UE functional use, Decreased knowledge of use of DME  Visit Diagnosis: Acute pain of right knee  Acute pain of left knee  Other abnormalities of gait and mobility     Problem List Patient Active Problem List   Diagnosis Date Noted  . Candidiasis   . Abnormality of gait   . Hypoalbuminemia due to protein-calorie malnutrition ([place])   . History of gout   . Sleep disturbance   . Constipation    . Rupture of quadriceps tendon, right, sequela 06/19/2017  . Quadriceps tendon rupture,  left, sequela 06/19/2017  . Acute blood loss anemia 06/19/2017  . Fall   . History of subdural hematoma   . Post-operative pain   . Recurrent falls   . Quadriceps tendon rupture 06/15/2017  . Primary osteoarthritis of both knees 02/28/2017  . Primary osteoarthritis of both hands 02/28/2017  . Uricacidemia 02/28/2017  . Pain in joint of left knee 02/07/2017  . Idiopathic chronic gout, unspecified site, without tophus (tophi) 11/29/2016  . HEMATURIA UNSPECIFIED 01/25/2009  . ABDOMINAL PAIN 01/25/2009  . XERODERMA 03/10/2008  . UNS ADVRS EFF UNS RX MEDICINAL&BIOLOGICAL SBSTNC 03/10/2008  . ADJUSTMENT REACTION WITH PHYSICAL SYMPTOMS 02/14/2008  . MUSCLE SPASM 02/14/2008  . ACUTE PROSTATITIS 12/16/2007  . BREAST LUMP OR MASS, RIGHT 07/12/2007  . H/O: RCT (rotator cuff tear) 01/13/2007  . GOUT 01/08/2007    Carney Living PT DPT  09/13/2017, 3:42 PM  [facility] [address] [place], [place], [address] Phone: [phone]   Fax:  [phone]  Name: Jose [name] MRN: [id number] Date of Birth: [birth year]

## 2017-09-14 DIAGNOSIS — F438 Other reactions to severe stress: Secondary | ICD-10-CM | POA: Diagnosis not present

## 2017-09-17 ENCOUNTER — Ambulatory Visit: Payer: Medicare Other | Admitting: Physical Therapy

## 2017-09-17 ENCOUNTER — Ambulatory Visit: Payer: Medicare Other | Attending: Internal Medicine | Admitting: Physical Therapy

## 2017-09-17 ENCOUNTER — Encounter: Payer: Self-pay | Admitting: Physical Therapy

## 2017-09-17 DIAGNOSIS — M25562 Pain in left knee: Secondary | ICD-10-CM | POA: Diagnosis not present

## 2017-09-17 DIAGNOSIS — X58XXXS Exposure to other specified factors, sequela: Secondary | ICD-10-CM | POA: Diagnosis not present

## 2017-09-17 DIAGNOSIS — R2689 Other abnormalities of gait and mobility: Secondary | ICD-10-CM | POA: Diagnosis not present

## 2017-09-17 DIAGNOSIS — M25561 Pain in right knee: Secondary | ICD-10-CM | POA: Diagnosis not present

## 2017-09-17 DIAGNOSIS — S76111S Strain of right quadriceps muscle, fascia and tendon, sequela: Secondary | ICD-10-CM | POA: Diagnosis not present

## 2017-09-17 DIAGNOSIS — F438 Other reactions to severe stress: Secondary | ICD-10-CM | POA: Diagnosis not present

## 2017-09-17 DIAGNOSIS — N401 Enlarged prostate with lower urinary tract symptoms: Secondary | ICD-10-CM | POA: Diagnosis not present

## 2017-09-17 DIAGNOSIS — R972 Elevated prostate specific antigen [PSA]: Secondary | ICD-10-CM | POA: Diagnosis not present

## 2017-09-17 NOTE — Therapy (Signed)
[place], [place], [address] Phone: [phone]   Fax:  [phone]  Physical Therapy Treatment  Patient Details  Name: Jose [name] MRN: [id number] Date of Birth: 05/15/1951 Referring Provider: Dr Meredith Pel   Encounter Date: 09/17/2017      PT End of Session - 09/17/17 1057    Visit Number 6   Number of Visits 16   Date for PT Re-Evaluation 10/29/17   PT Start Time 0847   PT Stop Time 0930   PT Time Calculation (min) 43 min   Activity Tolerance Patient tolerated treatment well   Behavior During Therapy [facility] for tasks assessed/performed      Past Medical History:  Diagnosis Date  . Arthritis    R shoulder, great toes, hands- has gout & has injections in knees with Dr. Estanislado Pandy   . Cancer ([place])    skin ca-   . GERD (gastroesophageal reflux disease)   . History of hiatal hernia   . SDH (subdural hematoma) (HCC)     Past Surgical History:  Procedure Laterality Date  . BRAIN SURGERY  2016   evacuation of SDH  . CARPAL TUNNEL RELEASE Bilateral   . GREEN LIGHT LASER TURP (TRANSURETHRAL RESECTION OF PROSTATE  04/27/2017  . QUADRICEPS TENDON REPAIR Bilateral 06/15/2017   Procedure: REPAIR QUADRICEP TENDON;  Surgeon: Meredith Pel, MD;  Location: [facility];  Service: Orthopedics;  Laterality: Bilateral;  . SHOULDER SURGERY Right     There were no vitals filed for this visit.      Subjective Assessment - 09/17/17 0852    Currently in Pain? Yes   Pain Score 8    Pain Location Knee   Pain Orientation Left   Pain Descriptors / Indicators Aching   Pain Type Surgical pain   Pain Frequency Constant   Aggravating Factors  night,  activity during the day delay   Pain Relieving Factors ice,  tylenol,  pain meds   Pain Score 2   Pain Orientation Right                         [facility] Adult PT Treatment/Exercise - 09/17/17 0001      Ambulation/Gait   Stairs Yes    Height of Stairs --  4, 6 inches.On 4 in. step over step with hands on rails   Pre-Gait Activities wall slides facing forward,  cued to avoid locking knee   Gait Comments YOGA walking,  cued  30 feet,  less knee locking noted.     Self-Care   Self-Care Other Self-Care Comments   Other Self-Care Comments  OK to use heat as long as it does not ach.  May try elevating leg prior to going to bed.       Knee/Hip Exercises: Stretches   Passive Hamstring Stretch 3 reps   Other Knee/Hip Stretches slant board stretch      Knee/Hip Exercises: Aerobic   Nustep LE 6 minutes L4     Knee/Hip Exercises: Machines for Strengthening   Cybex Leg Press 1, plate single , double legs up to 3 plates     Knee/Hip Exercises: Standing   Lateral Step Up Limitations 6 inch 10 x on steps   Forward Step Up Limitations 10 x 6 inches,  cued to avoid locking knee  on steps   Other Standing Knee Exercises walking with knees slightly bent 10 feet X2.   Other Standing Knee  Exercises 3 X 3 way.  Cued to do with knee slightly flexed.  Difficult     Knee/Hip Exercises: Supine   Short Arc Quad Sets 10 reps   Short Arc Quad Sets Limitations 4 LBS,  10 x 10 second holds.   Patellar Mobs yes,  creeping medial lateral glides concurrent with moist heat     Moist Heat Therapy   Number Minutes Moist Heat 5 Minutes   Moist Heat Location Knee  concurrent with moist heat.                 PT Education - 09/17/17 1056    Education provided Yes   Education Details Pain control,  gait   Person(s) Educated Patient   Methods Explanation;Demonstration;Verbal cues   Comprehension Verbalized understanding;Returned demonstration          PT Short Term Goals - 09/17/17 1059      PT SHORT TERM GOAL #1   Title Patient will increase bilateral lower extremity strength to 5/5    Time 4   Period Weeks     PT SHORT TERM GOAL #2   Title Patient will be independent with inital HEP    Baseline independent with exercises  issued so far   Time 4   Period Weeks   Status On-going     PT SHORT TERM GOAL #3   Title Patient will demsotrate 20 second single leg stance bilateral    Time 4   Period Weeks   Status Unable to assess           PT Long Term Goals - 09/03/17 1058      PT LONG TERM GOAL #1   Title Patient will walk 1 moile without self report of pain    Time 8   Period Weeks   Status New     PT LONG TERM GOAL #2   Title Patient will demsotrate a 30% limitation on FOTO    Time 8   Period Weeks   Status New     PT LONG TERM GOAL #3   Title Patient will sleep through the night without increased pain    Time 8   Period Weeks   Status New     PT LONG TERM GOAL #4   Title Patient will stand for 1 hour without increased pain in order to perfrom daily tasks   Time 8   Period Weeks   Status New               Plan - 09/17/17 1057    Clinical Impression Statement Exercise today open and closed chain.  care taken to avoid pain.  Noted increased use of quads at end of session.  He tends  to walk/stand  with Left  knee locked.  No pain increase with session.   PT Treatment/Interventions ADLs/Self Care Home Management;Cryotherapy;Electrical Stimulation;Iontophoresis 4mg /ml Dexamethasone;Stair training;Gait training;Moist Heat;Therapeutic activities;Therapeutic exercise;Neuromuscular re-education;Patient/family education;Passive range of motion;Manual techniques;Taping;Ultrasound   PT Next Visit Plan continue with steps; consider standing hip exercises; progress weights and bands as tolerated. Terminal knee extension.   PT Home Exercise Plan SLR; bridge, LAQ yellow; hamstring yellow; standing march    Consulted and Agree with Plan of Care Patient      Patient will benefit from skilled therapeutic intervention in order to improve the following deficits and impairments:     Visit Diagnosis: Acute pain of right knee  Acute pain of left knee  Other abnormalities of gait and  mobility  Rupture of quadriceps tendon, right, sequela     Problem List Patient Active Problem List   Diagnosis Date Noted  . Candidiasis   . Abnormality of gait   . Hypoalbuminemia due to protein-calorie malnutrition ([place])   . History of gout   . Sleep disturbance   . Constipation   . Rupture of quadriceps tendon, right, sequela 06/19/2017  . Quadriceps tendon rupture, left, sequela 06/19/2017  . Acute blood loss anemia 06/19/2017  . Fall   . History of subdural hematoma   . Post-operative pain   . Recurrent falls   . Quadriceps tendon rupture 06/15/2017  . Primary osteoarthritis of both knees 02/28/2017  . Primary osteoarthritis of both hands 02/28/2017  . Uricacidemia 02/28/2017  . Pain in joint of left knee 02/07/2017  . Idiopathic chronic gout, unspecified site, without tophus (tophi) 11/29/2016  . HEMATURIA UNSPECIFIED 01/25/2009  . ABDOMINAL PAIN 01/25/2009  . XERODERMA 03/10/2008  . UNS ADVRS EFF UNS RX MEDICINAL&BIOLOGICAL SBSTNC 03/10/2008  . ADJUSTMENT REACTION WITH PHYSICAL SYMPTOMS 02/14/2008  . MUSCLE SPASM 02/14/2008  . ACUTE PROSTATITIS 12/16/2007  . BREAST LUMP OR MASS, RIGHT 07/12/2007  . H/O: RCT (rotator cuff tear) 01/13/2007  . GOUT 01/08/2007    HARRIS,KAREN PTA 09/17/2017, 11:35 AM  [facility] [address] [place], [place], [address] Phone: [phone]   Fax:  [phone]  Name: Jose [name] MRN: [id number] Date of Birth: 14-Feb-1951

## 2017-09-19 ENCOUNTER — Ambulatory Visit: Payer: Medicare Other | Admitting: Physical Therapy

## 2017-09-19 DIAGNOSIS — Z23 Encounter for immunization: Secondary | ICD-10-CM | POA: Diagnosis not present

## 2017-09-20 DIAGNOSIS — F438 Other reactions to severe stress: Secondary | ICD-10-CM | POA: Diagnosis not present

## 2017-09-21 ENCOUNTER — Ambulatory Visit: Payer: Medicare Other | Admitting: Physical Therapy

## 2017-09-21 DIAGNOSIS — M25562 Pain in left knee: Secondary | ICD-10-CM | POA: Diagnosis not present

## 2017-09-21 DIAGNOSIS — R2689 Other abnormalities of gait and mobility: Secondary | ICD-10-CM | POA: Diagnosis not present

## 2017-09-21 DIAGNOSIS — S76111S Strain of right quadriceps muscle, fascia and tendon, sequela: Secondary | ICD-10-CM

## 2017-09-21 DIAGNOSIS — M25561 Pain in right knee: Secondary | ICD-10-CM | POA: Diagnosis not present

## 2017-09-21 NOTE — Therapy (Signed)
[place], [place], [address] Phone: [phone]   Fax:  [phone]  Physical Therapy Treatment  Patient Details  Name: Jose [name] MRN: [id number] Date of Birth: December 01, 1951 Referring Provider: Dr Meredith Pel   Encounter Date: 09/21/2017      PT End of Session - 09/21/17 1205    Visit Number 7   Number of Visits 16   Date for PT Re-Evaluation 10/29/17   Authorization Type Blue cross blue shield    PT Start Time 1100   PT Stop Time 1155   PT Time Calculation (min) 55 min      Past Medical History:  Diagnosis Date  . Arthritis    R shoulder, great toes, hands- has gout & has injections in knees with Dr. Estanislado Pandy   . Cancer ([place])    skin ca-   . GERD (gastroesophageal reflux disease)   . History of hiatal hernia   . SDH (subdural hematoma) (HCC)     Past Surgical History:  Procedure Laterality Date  . BRAIN SURGERY  2016   evacuation of SDH  . CARPAL TUNNEL RELEASE Bilateral   . GREEN LIGHT LASER TURP (TRANSURETHRAL RESECTION OF PROSTATE  04/27/2017  . QUADRICEPS TENDON REPAIR Bilateral 06/15/2017   Procedure: REPAIR QUADRICEP TENDON;  Surgeon: Meredith Pel, MD;  Location: [facility];  Service: Orthopedics;  Laterality: Bilateral;  . SHOULDER SURGERY Right     There were no vitals filed for this visit.      Subjective Assessment - 09/21/17 1103    Subjective No pain now. Left knee is giving away more.    Currently in Pain? No/denies                         [facility] Adult PT Treatment/Exercise - 09/21/17 0001      Knee/Hip Exercises: Aerobic   Nustep LE 8 minutes L4     Knee/Hip Exercises: Machines for Strengthening   Cybex Leg Press 1, plate single , double legs up to 3 plates     Knee/Hip Exercises: Standing   Lateral Step Up Limitations 6 inch x 15 each    Forward Step Up Limitations 10 x 6 inches,  cued to avoid locking knee   SLS 17 Rt, 15 Lt   Other Standing Knee Exercises  3 way hip 10x .  Cued to do with knee slightly flexed.  Difficult     Knee/Hip Exercises: Seated   Long Arc Quad 2 sets;10 reps  left   Long Arc Quad Weight 5 lbs.     Knee/Hip Exercises: Supine   Quad Sets 10 reps   Short Arc Quad Sets 20 reps   Short Arc Quad Sets Limitations assist to reach full extension    Terminal Knee Extension 10 reps   Theraband Level (Terminal Knee Extension) Level 3 (Green)   Terminal Knee Extension Limitations blue    Straight Leg Raise with External Rotation 10 reps     Cryotherapy   Number Minutes Cryotherapy 10 Minutes   Cryotherapy Location Knee   Type of Cryotherapy Ice pack                  PT Short Term Goals - 09/21/17 1203      PT SHORT TERM GOAL #1   Title Patient will increase bilateral lower extremity strength to 5/5    Baseline working on strengthening    Time 4  Period Weeks   Status Unable to assess     PT SHORT TERM GOAL #2   Title Patient will be independent with inital HEP    Baseline independent with exercises issued so far   Time 4   Period Weeks   Status Achieved     PT SHORT TERM GOAL #3   Title Patient will demsotrate 20 second single leg stance bilateral    Time 4   Period Weeks   Status On-going           PT Long Term Goals - 09/03/17 1058      PT LONG TERM GOAL #1   Title Patient will walk 1 moile without self report of pain    Time 8   Period Weeks   Status New     PT LONG TERM GOAL #2   Title Patient will demsotrate a 30% limitation on FOTO    Time 8   Period Weeks   Status New     PT LONG TERM GOAL #3   Title Patient will sleep through the night without increased pain    Time 8   Period Weeks   Status New     PT LONG TERM GOAL #4   Title Patient will stand for 1 hour without increased pain in order to perfrom daily tasks   Time 8   Period Weeks   Status New               Plan - 09/21/17 1103    Clinical Impression Statement Feels  left knee is buckling more often. Is walking up and down stairs and ambulating during the days several times and will not have pain until evening. Continued closed chain with slight knee flexion as well as closed chain quad exercises. Pt continues with quad lag on left. SLS improved however goal not yet met.    PT Next Visit Plan continue with steps; consider standing hip exercises; progress weights and bands as tolerated. Terminal knee extension.   PT Home Exercise Plan SLR; bridge, LAQ yellow; hamstring yellow; standing march    Consulted and Agree with Plan of Care Patient      Patient will benefit from skilled therapeutic intervention in order to improve the following deficits and impairments:  Abnormal gait, Decreased range of motion, Pain, Decreased strength, Decreased activity tolerance, Impaired UE functional use, Decreased knowledge of use of DME  Visit Diagnosis: Acute pain of right knee  Acute pain of left knee  Other abnormalities of gait and mobility  Rupture of quadriceps tendon, right, sequela     Problem List Patient Active Problem List   Diagnosis Date Noted  . Candidiasis   . Abnormality of gait   . Hypoalbuminemia due to protein-calorie malnutrition ([place])   . History of gout   . Sleep disturbance   . Constipation   . Rupture of quadriceps tendon, right, sequela 06/19/2017  . Quadriceps tendon rupture, left, sequela 06/19/2017  . Acute blood loss anemia 06/19/2017  . Fall   . History of subdural hematoma   . Post-operative pain   . Recurrent falls   . Quadriceps tendon rupture 06/15/2017  . Primary osteoarthritis of both knees 02/28/2017  . Primary osteoarthritis of both hands 02/28/2017  . Uricacidemia 02/28/2017  . Pain in joint of left knee 02/07/2017  . Idiopathic chronic gout, unspecified site, without tophus (tophi) 11/29/2016  . HEMATURIA UNSPECIFIED 01/25/2009  . ABDOMINAL PAIN 01/25/2009  . XERODERMA 03/10/2008  . UNS ADVRS  EFF UNS RX  MEDICINAL&BIOLOGICAL SBSTNC 03/10/2008  . ADJUSTMENT REACTION WITH PHYSICAL SYMPTOMS 02/14/2008  . MUSCLE SPASM 02/14/2008  . ACUTE PROSTATITIS 12/16/2007  . BREAST LUMP OR MASS, RIGHT 07/12/2007  . H/O: RCT (rotator cuff tear) 01/13/2007  . GOUT 01/08/2007    Dorene Ar , PTA 09/21/2017, 12:05 PM  [facility] [address] [place], [place], [address] Phone: [phone]   Fax:  [phone]  Name: Jose [name] MRN: [id number] Date of Birth: 01-Jul-1951

## 2017-09-24 ENCOUNTER — Telehealth (INDEPENDENT_AMBULATORY_CARE_PROVIDER_SITE_OTHER): Payer: Self-pay | Admitting: Orthopedic Surgery

## 2017-09-24 ENCOUNTER — Ambulatory Visit: Payer: Medicare Other | Admitting: Physical Therapy

## 2017-09-24 NOTE — Telephone Encounter (Signed)
Has been working really hard at [employer].  No pain during the day but at night but has severe muscle spasms and cramping in his leg. He is not sleeping at all. As soon as he lays down the spasms start. Wants to know what you suggest. Not currently taking anything. He has to be able to work.  Wants to know if he could have rx for muscle relaxer? And any other suggestions?

## 2017-09-24 NOTE — Telephone Encounter (Signed)
Received voicemail message from patient stating he is having problem with his left knee. Patient said he is also having problems when he has therapy. Patient asked for a call back to discuss. The number to contact patient is [phone]

## 2017-09-24 NOTE — Telephone Encounter (Signed)
Recommend that he buy cramp 911 over the Internet and we can also send him in a prescription for baclofen 10 mg twice a day #40 scratch that #60 with 1 refill

## 2017-09-25 MED ORDER — BACLOFEN 10 MG PO TABS: 10.0000 mg | ORAL_TABLET | Freq: Two times a day (BID) | ORAL | 1 refills | Status: DC

## 2017-09-25 NOTE — Telephone Encounter (Signed)
IC s/w patient and advised rx submitted.  

## 2017-09-26 ENCOUNTER — Ambulatory Visit: Payer: Medicare Other | Admitting: Physical Therapy

## 2017-09-26 ENCOUNTER — Encounter: Payer: Self-pay | Admitting: Physical Therapy

## 2017-09-26 DIAGNOSIS — M25562 Pain in left knee: Secondary | ICD-10-CM | POA: Diagnosis not present

## 2017-09-26 DIAGNOSIS — S76111S Strain of right quadriceps muscle, fascia and tendon, sequela: Secondary | ICD-10-CM | POA: Diagnosis not present

## 2017-09-26 DIAGNOSIS — R2689 Other abnormalities of gait and mobility: Secondary | ICD-10-CM | POA: Diagnosis not present

## 2017-09-26 DIAGNOSIS — M25561 Pain in right knee: Secondary | ICD-10-CM | POA: Diagnosis not present

## 2017-09-26 NOTE — Therapy (Signed)
[place], [place], [address] Phone: [phone]   Fax:  [phone]  Physical Therapy Treatment  Patient Details  Name: Jose [name] MRN: [id number] Date of Birth: Nov 19, 1951 Referring Provider: Dr Meredith Pel   Encounter Date: 09/26/2017      PT End of Session - 09/26/17 0952    Visit Number 8   Number of Visits 16   Date for PT Re-Evaluation 10/29/17   Authorization Type Blue cross blue shield    PT Start Time 0855   PT Stop Time 0953   PT Time Calculation (min) 58 min   Activity Tolerance Patient tolerated treatment well   Behavior During Therapy [facility] for tasks assessed/performed      Past Medical History:  Diagnosis Date  . Arthritis    R shoulder, great toes, hands- has gout & has injections in knees with Dr. Estanislado Pandy   . Cancer ([place])    skin ca-   . GERD (gastroesophageal reflux disease)   . History of hiatal hernia   . SDH (subdural hematoma) (HCC)     Past Surgical History:  Procedure Laterality Date  . BRAIN SURGERY  2016   evacuation of SDH  . CARPAL TUNNEL RELEASE Bilateral   . GREEN LIGHT LASER TURP (TRANSURETHRAL RESECTION OF PROSTATE  04/27/2017  . QUADRICEPS TENDON REPAIR Bilateral 06/15/2017   Procedure: REPAIR QUADRICEP TENDON;  Surgeon: Meredith Pel, MD;  Location: [facility];  Service: Orthopedics;  Laterality: Bilateral;  . SHOULDER SURGERY Right     There were no vitals filed for this visit.      Subjective Assessment - 09/26/17 0902    Subjective Walks 1.7 miles every day since last Friday.  Was on his feet in grocery store for 48 min and did OK.  No cramping, pain last night.  MD prescribed new meds but not the cream.     Currently in Pain? No/denies            [facility] PT Assessment - 09/26/17 0001      Observation/Other Assessments   Focus on Therapeutic Outcomes (FOTO)  46%     AROM   Right Knee Extension 0   Right Knee Flexion 138   Left  Knee Extension 0   Left Knee Flexion 130     Strength   Right Hip Flexion 4+/5   Left Hip Flexion 4+/5   Right Knee Flexion 4+/5   Right Knee Extension 5/5   Left Knee Flexion 5/5   Left Knee Extension 4/5  pain             [facility] Adult PT Treatment/Exercise - 09/26/17 0001      Knee/Hip Exercises: Standing   Heel Raises 1 set;20 reps   Hip Abduction Stengthening;1 set;10 reps   Hip Extension Stengthening;Both;1 set;10 reps   Forward Step Up Both;1 set;10 reps   Forward Step Up Limitations no UE assist focus on knee control    Functional Squat 1 set;10 reps   Wall Squat 1 set;20 reps   Wall Squat Limitations mid range, 5 sec hold      Knee/Hip Exercises: Seated   Long Arc Quad Strengthening;Both;1 set;20 reps;Weights   Long Arc Quad Weight 5 lbs.  ball squeeze     Knee/Hip Exercises: Supine   Bridges Limitations knee ext with ball squeeze alternating x 10    Bridges with [employer] Strengthening;Both;1 set;10 reps   Straight Leg Raises Strengthening;Both;1 set;10 reps  Cryotherapy   Number Minutes Cryotherapy 10 Minutes   Cryotherapy Location Knee   Type of Cryotherapy Ice pack                PT Education - 09/26/17 0952    Education provided Yes   Education Details progress with ROM and strength, FOTO    Person(s) Educated Patient   Methods Explanation   Comprehension Verbalized understanding          PT Short Term Goals - 09/26/17 0953      PT SHORT TERM GOAL #1   Title Patient will increase bilateral lower extremity strength to 5/5    Baseline progressing, L quad 4/5   Status Partially Met     PT SHORT TERM GOAL #2   Title Patient will be independent with inital HEP    Status Achieved     PT SHORT TERM GOAL #3   Title Patient will demsotrate 20 second single leg stance bilateral    Baseline 15 sec needs occ UE A   Status Partially Met           PT Long Term Goals - 09/26/17 0953      PT LONG TERM GOAL #1   Title Patient  will walk 1 moile without self report of pain    Baseline min difficulty   Status Partially Met     PT LONG TERM GOAL #2   Title Patient will demsotrate a 30% limitation on FOTO    Baseline 46%   Status On-going     PT LONG TERM GOAL #3   Title Patient will sleep through the night without increased pain    Baseline did so last night, recently added Baclofen for night time muscle   Status Partially Met     PT LONG TERM GOAL #4   Title Patient will stand for 1 hour without increased pain in order to perfrom daily tasks   Baseline 48 min with min pain    Status Partially Met               Plan - 09/26/17 0954    Clinical Impression Statement Patient doing well, reports min pain with walking and periods of standing up to 48 min (timed in grocery store).  He has increased AROM in bilateral knees up to [facility] (130 deg) in flexion.  He continues to demo knee hyperextension in standing.  Decreased quad endurance and has a 10 -15 deg quad lag bilaterally.     PT Next Visit Plan continue with steps; SLS, knee control , standing hip exercises (add to HEP?) ; progress weights and bands as tolerated. Terminal knee extension.   PT Home Exercise Plan SLR; bridge, LAQ yellow; hamstring yellow; standing march    Consulted and Agree with Plan of Care Patient      Patient will benefit from skilled therapeutic intervention in order to improve the following deficits and impairments:  Abnormal gait, Decreased range of motion, Pain, Decreased strength, Decreased activity tolerance, Impaired UE functional use, Decreased knowledge of use of DME  Visit Diagnosis: Acute pain of right knee  Acute pain of left knee  Other abnormalities of gait and mobility  Rupture of quadriceps tendon, right, sequela     Problem List Patient Active Problem List   Diagnosis Date Noted  . Candidiasis   . Abnormality of gait   . Hypoalbuminemia due to protein-calorie malnutrition ([place])   . History of gout   .  Sleep disturbance   .  Constipation   . Rupture of quadriceps tendon, right, sequela 06/19/2017  . Quadriceps tendon rupture, left, sequela 06/19/2017  . Acute blood loss anemia 06/19/2017  . Fall   . History of subdural hematoma   . Post-operative pain   . Recurrent falls   . Quadriceps tendon rupture 06/15/2017  . Primary osteoarthritis of both knees 02/28/2017  . Primary osteoarthritis of both hands 02/28/2017  . Uricacidemia 02/28/2017  . Pain in joint of left knee 02/07/2017  . Idiopathic chronic gout, unspecified site, without tophus (tophi) 11/29/2016  . HEMATURIA UNSPECIFIED 01/25/2009  . ABDOMINAL PAIN 01/25/2009  . XERODERMA 03/10/2008  . UNS ADVRS EFF UNS RX MEDICINAL&BIOLOGICAL SBSTNC 03/10/2008  . ADJUSTMENT REACTION WITH PHYSICAL SYMPTOMS 02/14/2008  . MUSCLE SPASM 02/14/2008  . ACUTE PROSTATITIS 12/16/2007  . BREAST LUMP OR MASS, RIGHT 07/12/2007  . H/O: RCT (rotator cuff tear) 01/13/2007  . GOUT 01/08/2007    Jose [name] 09/26/2017, 10:02 AM  [facility] [address] [place], [place], [address] Phone: [phone]   Fax:  [phone]  Name: Jose [name] MRN: [id number] Date of Birth: 1951-04-20  Raeford Razor, PT 09/26/17 10:05 AM Phone: [phone] Fax: [phone]

## 2017-10-01 ENCOUNTER — Ambulatory Visit: Payer: Medicare Other | Admitting: Physical Therapy

## 2017-10-01 ENCOUNTER — Encounter: Payer: Self-pay | Admitting: Physical Therapy

## 2017-10-01 DIAGNOSIS — R2689 Other abnormalities of gait and mobility: Secondary | ICD-10-CM | POA: Diagnosis not present

## 2017-10-01 DIAGNOSIS — S76111S Strain of right quadriceps muscle, fascia and tendon, sequela: Secondary | ICD-10-CM | POA: Diagnosis not present

## 2017-10-01 DIAGNOSIS — M25561 Pain in right knee: Secondary | ICD-10-CM

## 2017-10-01 DIAGNOSIS — M25562 Pain in left knee: Secondary | ICD-10-CM

## 2017-10-01 NOTE — Therapy (Signed)
[place], [place], [address] Phone: [phone]   Fax:  [phone]  Physical Therapy Treatment  Patient Details  Name: Jose [name] MRN: [id number] Date of Birth: 03-25-1951 Referring Provider: Dr Meredith Pel   Encounter Date: 10/01/2017      PT End of Session - 10/01/17 1549    Visit Number 9   Number of Visits 16   Date for PT Re-Evaluation 10/29/17   PT Start Time 0300   PT Stop Time 1417   PT Time Calculation (min) 42 min   Activity Tolerance Patient tolerated treatment well   Behavior During Therapy [facility] for tasks assessed/performed      Past Medical History:  Diagnosis Date  . Arthritis    R shoulder, great toes, hands- has gout & has injections in knees with Dr. Estanislado Pandy   . Cancer ([place])    skin ca-   . GERD (gastroesophageal reflux disease)   . History of hiatal hernia   . SDH (subdural hematoma) (HCC)     Past Surgical History:  Procedure Laterality Date  . BRAIN SURGERY  2016   evacuation of SDH  . CARPAL TUNNEL RELEASE Bilateral   . GREEN LIGHT LASER TURP (TRANSURETHRAL RESECTION OF PROSTATE  04/27/2017  . QUADRICEPS TENDON REPAIR Bilateral 06/15/2017   Procedure: REPAIR QUADRICEP TENDON;  Surgeon: Meredith Pel, MD;  Location: [facility];  Service: Orthopedics;  Laterality: Bilateral;  . SHOULDER SURGERY Right     There were no vitals filed for this visit.                       [facility] Adult PT Treatment/Exercise - 10/01/17 0001      Knee/Hip Exercises: Machines for Strengthening   Cybex Knee Flexion 25 X 10,  35 X 10,  both     Knee/Hip Exercises: Standing   Terminal Knee Extension Limitations 5 X ball press into wall   Gait Training 30- 40 feet cued  with brace     Knee/Hip Exercises: Seated   Long Arc Quad Strengthening;3 sets   [employer] Limitations  with Russian stim,  Passive extension then hold.     Knee/Hip Exercises: Supine   Quad Sets Limitations 8 minutes with russian stim.  able to grt a fair quad contraction without movinng patella superiorly.  Improved post stim.   Short Arc [employer] Limitations 4 minutes with [place] stim                  PT Short Term Goals - 10/01/17 1552      PT SHORT TERM GOAL #1   Title Patient will increase bilateral lower extremity strength to 5/5    Baseline progressing, L quad 4/5   Time 4   Period Weeks   Status Partially Met     PT SHORT TERM GOAL #2   Title Patient will be independent with inital HEP    Baseline independent   Time 4   Period Weeks   Status Achieved     PT SHORT TERM GOAL #3   Title Patient will demsotrate 20 second single leg stance bilateral    Time 4   Period Weeks   Status Unable to assess           PT Long Term Goals - 09/26/17 9233      PT LONG TERM GOAL #1   Title Patient will walk 1 moile without self  report of pain    Baseline min difficulty   Status Partially Met     PT LONG TERM GOAL #2   Title Patient will demsotrate a 30% limitation on FOTO    Baseline 46%   Status On-going     PT LONG TERM GOAL #3   Title Patient will sleep through the night without increased pain    Baseline did so last night, recently added Baclofen for night time muscle   Status Partially Met     PT LONG TERM GOAL #4   Title Patient will stand for 1 hour without increased pain in order to perfrom daily tasks   Baseline 48 min with min pain    Status Partially Met               Plan - 10/01/17 1549    Clinical Impression Statement Trial of FES to quads,  He has been able to have a fair quad contraction without moving patella superiorly.  Patellar active ROM improved some .  Gait less guarded at end of session.  No pain.  Divot proximal patella from brace strap?     PT Next Visit Plan continue with steps; SLS, knee control ,  ; progress weights and bands as tolerated. Terminal knee extension.  Consider IFC again,     PT Home  Exercise Plan SLR; bridge, LAQ yellow; hamstring yellow; standing march    Consulted and Agree with Plan of Care Patient      Patient will benefit from skilled therapeutic intervention in order to improve the following deficits and impairments:     Visit Diagnosis: Acute pain of right knee  Acute pain of left knee  Other abnormalities of gait and mobility  Rupture of quadriceps tendon, right, sequela     Problem List Patient Active Problem List   Diagnosis Date Noted  . Candidiasis   . Abnormality of gait   . Hypoalbuminemia due to protein-calorie malnutrition ([place])   . History of gout   . Sleep disturbance   . Constipation   . Rupture of quadriceps tendon, right, sequela 06/19/2017  . Quadriceps tendon rupture, left, sequela 06/19/2017  . Acute blood loss anemia 06/19/2017  . Fall   . History of subdural hematoma   . Post-operative pain   . Recurrent falls   . Quadriceps tendon rupture 06/15/2017  . Primary osteoarthritis of both knees 02/28/2017  . Primary osteoarthritis of both hands 02/28/2017  . Uricacidemia 02/28/2017  . Pain in joint of left knee 02/07/2017  . Idiopathic chronic gout, unspecified site, without tophus (tophi) 11/29/2016  . HEMATURIA UNSPECIFIED 01/25/2009  . ABDOMINAL PAIN 01/25/2009  . XERODERMA 03/10/2008  . UNS ADVRS EFF UNS RX MEDICINAL&BIOLOGICAL SBSTNC 03/10/2008  . ADJUSTMENT REACTION WITH PHYSICAL SYMPTOMS 02/14/2008  . MUSCLE SPASM 02/14/2008  . ACUTE PROSTATITIS 12/16/2007  . BREAST LUMP OR MASS, RIGHT 07/12/2007  . H/O: RCT (rotator cuff tear) 01/13/2007  . GOUT 01/08/2007    Shaun Runyon PTA 10/01/2017, 3:54 PM  [facility] [address] [place], [place], [address] Phone: [phone]   Fax:  [phone]  Name: LUAY BALDING MRN: [id number] Date of Birth: 28-Jun-1951

## 2017-10-03 ENCOUNTER — Ambulatory Visit: Payer: Medicare Other | Admitting: Physical Therapy

## 2017-10-03 DIAGNOSIS — M25562 Pain in left knee: Secondary | ICD-10-CM | POA: Diagnosis not present

## 2017-10-03 DIAGNOSIS — R2689 Other abnormalities of gait and mobility: Secondary | ICD-10-CM | POA: Diagnosis not present

## 2017-10-03 DIAGNOSIS — M25561 Pain in right knee: Secondary | ICD-10-CM | POA: Diagnosis not present

## 2017-10-03 DIAGNOSIS — S76111S Strain of right quadriceps muscle, fascia and tendon, sequela: Secondary | ICD-10-CM

## 2017-10-03 NOTE — Therapy (Addendum)
[place], [place], [address] Phone: [phone]   Fax:  [phone]  Physical Therapy Treatment  Patient Details  Name: Jose [name] MRN: [id number] Date of Birth: 29-Sep-1951 Referring Provider: Dr Meredith Pel   Encounter Date: 10/03/2017      PT End of Session - 10/03/17 1322    Visit Number 10   Number of Visits 16   Date for PT Re-Evaluation 10/29/17   PT Start Time 0017   PT Stop Time 1350   PT Time Calculation (min) 48 min      Past Medical History:  Diagnosis Date  . Arthritis    R shoulder, great toes, hands- has gout & has injections in knees with Dr. Estanislado Pandy   . Cancer ([place])    skin ca-   . GERD (gastroesophageal reflux disease)   . History of hiatal hernia   . SDH (subdural hematoma) (HCC)     Past Surgical History:  Procedure Laterality Date  . BRAIN SURGERY  2016   evacuation of SDH  . CARPAL TUNNEL RELEASE Bilateral   . GREEN LIGHT LASER TURP (TRANSURETHRAL RESECTION OF PROSTATE  04/27/2017  . QUADRICEPS TENDON REPAIR Bilateral 06/15/2017   Procedure: REPAIR QUADRICEP TENDON;  Surgeon: Meredith Pel, MD;  Location: [place];  Service: Orthopedics;  Laterality: Bilateral;  . SHOULDER SURGERY Right     There were no vitals filed for this visit.      Subjective Assessment - 10/03/17 1305    Currently in Pain? No/denies                         [facility] Adult PT Treatment/Exercise - 10/03/17 0001      Knee/Hip Exercises: Aerobic   Nustep LE 5 minutes L5     Knee/Hip Exercises: Machines for Strengthening   Cybex Knee Flexion 25 X 10,  35 X 10,  both   Cybex Leg Press 45#, 55#  x 20 , single L 15#      Knee/Hip Exercises: Standing   Terminal Knee Extension Limitations 20 x ball press into wall      Knee/Hip Exercises: Supine   Quad Sets Limitations 5 minutes with russian stim   Short Arc [employer] 20 reps   Short Arc Quad Sets Limitations   5 min with russian stim, passive extension then hold    Straight Leg Raises Limitations 5 min  with russian stim      Modalities   Modalities Social worker Location Left quad   Chartered certified accountant Forensic scientist Parameters 17 ma 10 on/20 off    Electrical Stimulation Goals Strength                  PT Short Term Goals - 10/01/17 1552      PT SHORT TERM GOAL #1   Title Patient will increase bilateral lower extremity strength to 5/5    Baseline progressing, L quad 4/5   Time 4   Period Weeks   Status Partially Met     PT SHORT TERM GOAL #2   Title Patient will be independent with inital HEP    Baseline independent   Time 4   Period Weeks   Status Achieved     PT SHORT TERM GOAL #3   Title Patient will demsotrate 20 second single leg stance bilateral  Time 4   Period Weeks   Status Unable to assess           PT Long Term Goals - 09/26/17 0932      PT LONG TERM GOAL #1   Title Patient will walk 1 moile without self report of pain    Baseline min difficulty   Status Partially Met     PT LONG TERM GOAL #2   Title Patient will demsotrate a 30% limitation on FOTO    Baseline 46%   Status On-going     PT LONG TERM GOAL #3   Title Patient will sleep through the night without increased pain    Baseline did so last night, recently added Baclofen for night time muscle   Status Partially Met     PT LONG TERM GOAL #4   Title Patient will stand for 1 hour without increased pain in order to perfrom daily tasks   Baseline 48 min with min pain    Status Partially Met               Plan - 10/03/17 1306    Clinical Impression Statement Pt reports stairs are still difficult with LLE. He continues with quad lag on left side but with good quad contraction. Improving rectus femoris contraction and improved patella movement superior with contraction. He reports this  round of Estim made his left knee feel tighter than the last time. He declined modalities.   PT Next Visit Plan continue with steps; SLS, knee control ,  ; progress weights and bands as tolerated. Terminal knee extension.  Repeat Russian    PT Home Exercise Plan SLR; bridge, LAQ yellow; hamstring yellow; standing march    Consulted and Agree with Plan of Care Patient      Patient will benefit from skilled therapeutic intervention in order to improve the following deficits and impairments:  Abnormal gait, Decreased range of motion, Pain, Decreased strength, Decreased activity tolerance, Impaired UE functional use, Decreased knowledge of use of DME  Visit Diagnosis: Acute pain of right knee  Acute pain of left knee  Other abnormalities of gait and mobility  Rupture of quadriceps tendon, right, sequela     Problem List Patient Active Problem List   Diagnosis Date Noted  . Candidiasis   . Abnormality of gait   . Hypoalbuminemia due to protein-calorie malnutrition ([place])   . History of gout   . Sleep disturbance   . Constipation   . Rupture of quadriceps tendon, right, sequela 06/19/2017  . Quadriceps tendon rupture, left, sequela 06/19/2017  . Acute blood loss anemia 06/19/2017  . Fall   . History of subdural hematoma   . Post-operative pain   . Recurrent falls   . Quadriceps tendon rupture 06/15/2017  . Primary osteoarthritis of both knees 02/28/2017  . Primary osteoarthritis of both hands 02/28/2017  . Uricacidemia 02/28/2017  . Pain in joint of left knee 02/07/2017  . Idiopathic chronic gout, unspecified site, without tophus (tophi) 11/29/2016  . HEMATURIA UNSPECIFIED 01/25/2009  . ABDOMINAL PAIN 01/25/2009  . XERODERMA 03/10/2008  . UNS ADVRS EFF UNS RX MEDICINAL&BIOLOGICAL SBSTNC 03/10/2008  . ADJUSTMENT REACTION WITH PHYSICAL SYMPTOMS 02/14/2008  . MUSCLE SPASM 02/14/2008  . ACUTE PROSTATITIS 12/16/2007  . BREAST LUMP OR MASS, RIGHT 07/12/2007  . H/O: RCT (rotator  cuff tear) 01/13/2007  . GOUT 01/08/2007    Dorene Ar, PTA 10/03/2017, 1:57 PM  [facility] [place],  [place], [address] Phone: [phone]   Fax:  [phone]  Name: Jose [name] MRN: [id number] Date of Birth: [birth year]

## 2017-10-09 ENCOUNTER — Ambulatory Visit: Payer: Medicare Other | Admitting: Physical Therapy

## 2017-10-09 DIAGNOSIS — M25561 Pain in right knee: Secondary | ICD-10-CM

## 2017-10-09 DIAGNOSIS — S76111S Strain of right quadriceps muscle, fascia and tendon, sequela: Secondary | ICD-10-CM | POA: Diagnosis not present

## 2017-10-09 DIAGNOSIS — M25562 Pain in left knee: Secondary | ICD-10-CM | POA: Diagnosis not present

## 2017-10-09 DIAGNOSIS — R2689 Other abnormalities of gait and mobility: Secondary | ICD-10-CM

## 2017-10-09 NOTE — Therapy (Signed)
[place], [place], [address] Phone: [phone]   Fax:  [phone]  Physical Therapy Treatment  Patient Details  Name: Jose [name] MRN: [id number] Date of Birth: May 16, 1951 Referring Provider: Dr Meredith Pel   Encounter Date: 10/09/2017      PT End of Session - 10/09/17 1511    Visit Number 11   Number of Visits 16   Date for PT Re-Evaluation 10/29/17   Authorization Type Blue cross blue shield    PT Start Time 0300   PT Stop Time 0345   PT Time Calculation (min) 45 min      Past Medical History:  Diagnosis Date  . Arthritis    R shoulder, great toes, hands- has gout & has injections in knees with Dr. Estanislado Pandy   . Cancer ([place])    skin ca-   . GERD (gastroesophageal reflux disease)   . History of hiatal hernia   . SDH (subdural hematoma) (HCC)     Past Surgical History:  Procedure Laterality Date  . BRAIN SURGERY  2016   evacuation of SDH  . CARPAL TUNNEL RELEASE Bilateral   . GREEN LIGHT LASER TURP (TRANSURETHRAL RESECTION OF PROSTATE  04/27/2017  . QUADRICEPS TENDON REPAIR Bilateral 06/15/2017   Procedure: REPAIR QUADRICEP TENDON;  Surgeon: Meredith Pel, MD;  Location: [facility];  Service: Orthopedics;  Laterality: Bilateral;  . SHOULDER SURGERY Right     There were no vitals filed for this visit.      Subjective Assessment - 10/09/17 1506    Subjective Walked 4.8 miles , attended a football game yesterday. I had to walk up 3 huge ramps.    Currently in Pain? Yes   Pain Score 2    Pain Location Knee   Pain Orientation Left;Right   Pain Descriptors / Indicators Aching   Aggravating Factors  increased walking, inclines    Pain Relieving Factors ice, tylenol, pain meds,                          [facility] Adult PT Treatment/Exercise - 10/09/17 0001      Knee/Hip Exercises: Aerobic   Nustep LE 5 minutes L5     Knee/Hip Exercises: Standing   Terminal  Knee Extension Limitations 20 x ball press into wall      Knee/Hip Exercises: Supine   Quad Sets 20 reps   Short Arc Quad Sets 20 reps   Terminal Knee Extension 10 reps   Straight Leg Raises 15 reps   Straight Leg Raises Limitations with initial quad set      Cryotherapy   Number Minutes Cryotherapy 10 Minutes   Cryotherapy Location Knee   Type of Cryotherapy Ice pack                  PT Short Term Goals - 10/01/17 1552      PT SHORT TERM GOAL #1   Title Patient will increase bilateral lower extremity strength to 5/5    Baseline progressing, L quad 4/5   Time 4   Period Weeks   Status Partially Met     PT SHORT TERM GOAL #2   Title Patient will be independent with inital HEP    Baseline independent   Time 4   Period Weeks   Status Achieved     PT SHORT TERM GOAL #3   Title Patient will demsotrate 20 second single leg stance bilateral  Time 4   Period Weeks   Status Unable to assess           PT Long Term Goals - 09/26/17 1607      PT LONG TERM GOAL #1   Title Patient will walk 1 moile without self report of pain    Baseline min difficulty   Status Partially Met     PT LONG TERM GOAL #2   Title Patient will demsotrate a 30% limitation on FOTO    Baseline 46%   Status On-going     PT LONG TERM GOAL #3   Title Patient will sleep through the night without increased pain    Baseline did so last night, recently added Baclofen for night time muscle   Status Partially Met     PT LONG TERM GOAL #4   Title Patient will stand for 1 hour without increased pain in order to perfrom daily tasks   Baseline 48 min with min pain    Status Partially Met               Plan - 10/09/17 1535    Clinical Impression Statement Pt walked 4.8 miles on saturday while attending a football games. Notes difficulty with inclines. Was sore afterward, better today. He continues with a significant divot superior to knee cap. Quad lag does seem a little better today.  Recommended pt call MD.    PT Next Visit Plan continue with steps; SLS, knee control ,  ; progress weights and bands as tolerated. Terminal knee extension.  Repeat Russian    PT Home Exercise Plan SLR; bridge, LAQ yellow; hamstring yellow; standing march    Consulted and Agree with Plan of Care Patient      Patient will benefit from skilled therapeutic intervention in order to improve the following deficits and impairments:  Abnormal gait, Decreased range of motion, Pain, Decreased strength, Decreased activity tolerance, Impaired UE functional use, Decreased knowledge of use of DME  Visit Diagnosis: Acute pain of right knee  Acute pain of left knee  Other abnormalities of gait and mobility  Rupture of quadriceps tendon, right, sequela     Problem List Patient Active Problem List   Diagnosis Date Noted  . Candidiasis   . Abnormality of gait   . Hypoalbuminemia due to protein-calorie malnutrition ([place])   . History of gout   . Sleep disturbance   . Constipation   . Rupture of quadriceps tendon, right, sequela 06/19/2017  . Quadriceps tendon rupture, left, sequela 06/19/2017  . Acute blood loss anemia 06/19/2017  . Fall   . History of subdural hematoma   . Post-operative pain   . Recurrent falls   . Quadriceps tendon rupture 06/15/2017  . Primary osteoarthritis of both knees 02/28/2017  . Primary osteoarthritis of both hands 02/28/2017  . Uricacidemia 02/28/2017  . Pain in joint of left knee 02/07/2017  . Idiopathic chronic gout, unspecified site, without tophus (tophi) 11/29/2016  . HEMATURIA UNSPECIFIED 01/25/2009  . ABDOMINAL PAIN 01/25/2009  . XERODERMA 03/10/2008  . UNS ADVRS EFF UNS RX MEDICINAL&BIOLOGICAL SBSTNC 03/10/2008  . ADJUSTMENT REACTION WITH PHYSICAL SYMPTOMS 02/14/2008  . MUSCLE SPASM 02/14/2008  . ACUTE PROSTATITIS 12/16/2007  . BREAST LUMP OR MASS, RIGHT 07/12/2007  . H/O: RCT (rotator cuff tear) 01/13/2007  . GOUT 01/08/2007    Dorene Ar, PTA 10/09/2017, 3:47 PM  [place], [place], [address] Phone: [phone]   Fax:  [phone]  Name: Jose [name] MRN: [id number] Date of Birth: 22-Jun-1951

## 2017-10-10 ENCOUNTER — Ambulatory Visit (INDEPENDENT_AMBULATORY_CARE_PROVIDER_SITE_OTHER): Payer: Medicare Other | Admitting: Orthopedic Surgery

## 2017-10-10 ENCOUNTER — Encounter (INDEPENDENT_AMBULATORY_CARE_PROVIDER_SITE_OTHER): Payer: Self-pay | Admitting: Orthopedic Surgery

## 2017-10-10 DIAGNOSIS — S76112S Strain of left quadriceps muscle, fascia and tendon, sequela: Secondary | ICD-10-CM

## 2017-10-10 NOTE — Progress Notes (Signed)
Office Visit Note   Patient: Jose [name]           Date of Birth: 1951-11-22           MRN: [id number] Visit Date: 10/10/2017 Requested by: Jose Spurling, MD [address] [place], [place] [address] PCP: Jose Spurling, MD  Subjective: Chief Complaint  Patient presents with  . Office Visit    L knee pain    HPI: Jose [name] is now 4 months out bilateral quad tendon repair.  Physical therapy was concerned about a divot proximal to the patella on the left-hand side.  This was the side of his chronic tear prior to repair.  He has been walking around with no assistive devices but does use a brace on that left knee.  He has had a couple of occasions of giving way.  This is been only on the left-hand side              ROS: All systems reviewed are negative as they relate to the chief complaint within the history of present illness.  Patient denies  fevers or chills.   Assessment & Plan: Visit Diagnoses: No diagnosis found.  Plan: Impression is well-functioning right quad tendon repair.  On the left-hand side I think he has had partial pulling away of that chronically torn and repaired lateral sided quad tendon rupture.  Centrally and medially the repair is intact as viewed by ultrasound.  He has about a 10 extensor lag.  I don't think a repeat surgery would necessarily be indicated for [place] at this time.  I would favor continued therapy and a home exercise program with final check in 3 months.  Follow-Up Instructions: Return in about 3 months (around 01/10/2018).   Orders:  No orders of the defined types were placed in this encounter.  No orders of the defined types were placed in this encounter.     Procedures: No procedures performed   Clinical Data: No additional findings.  Objective: Vital Signs: There were no vitals taken for this visit.  Physical Exam:   Constitutional: Patient appears well-developed HEENT:  Head: Normocephalic Eyes:EOM are normal Neck:  Normal range of motion Cardiovascular: Normal rate Pulmonary/chest: Effort normal Neurologic: Patient is alert Skin: Skin is warm Psychiatric: Patient has normal mood and affect    Ortho Exam: Orthopedic exam demonstrates pretty normal gait and alignment.  Right leg has full extension and full flexion with no effusion.  On the left-hand side there is a 10 extensor lag but 5 out of 5 leg extension strength.  Does have palpable defect about a centimeter on the lateral aspect of the superior lateral patella.  This is in the area where he has had chronic tear before.  Plan as described above  Specialty Comments:  No specialty comments available.  Imaging: No results found.   PMFS History: Patient Active Problem List   Diagnosis Date Noted  . Candidiasis   . Abnormality of gait   . Hypoalbuminemia due to protein-calorie malnutrition ([place])   . History of gout   . Sleep disturbance   . Constipation   . Rupture of quadriceps tendon, right, sequela 06/19/2017  . Quadriceps tendon rupture, left, sequela 06/19/2017  . Acute blood loss anemia 06/19/2017  . Fall   . History of subdural hematoma   . Post-operative pain   . Recurrent falls   . Quadriceps tendon rupture 06/15/2017  . Primary osteoarthritis of both knees 02/28/2017  . Primary osteoarthritis of  both hands 02/28/2017  . Uricacidemia 02/28/2017  . Pain in joint of left knee 02/07/2017  . Idiopathic chronic gout, unspecified site, without tophus (tophi) 11/29/2016  . HEMATURIA UNSPECIFIED 01/25/2009  . ABDOMINAL PAIN 01/25/2009  . XERODERMA 03/10/2008  . UNS ADVRS EFF UNS RX MEDICINAL&BIOLOGICAL SBSTNC 03/10/2008  . ADJUSTMENT REACTION WITH PHYSICAL SYMPTOMS 02/14/2008  . MUSCLE SPASM 02/14/2008  . ACUTE PROSTATITIS 12/16/2007  . BREAST LUMP OR MASS, RIGHT 07/12/2007  . H/O: RCT (rotator cuff tear) 01/13/2007  . GOUT 01/08/2007   Past Medical History:  Diagnosis Date  . Arthritis    R shoulder, great toes, hands-  has gout & has injections in knees with Dr. Estanislado Pandy   . Cancer ([place])    skin ca-   . GERD (gastroesophageal reflux disease)   . History of hiatal hernia   . SDH (subdural hematoma) (HCC)     Family History  Problem Relation Age of Onset  . Parkinson's disease Mother   . Heart disease Father   . Parkinson's disease Brother   . Diabetes Brother     Past Surgical History:  Procedure Laterality Date  . BRAIN SURGERY  2016   evacuation of SDH  . CARPAL TUNNEL RELEASE Bilateral   . GREEN LIGHT LASER TURP (TRANSURETHRAL RESECTION OF PROSTATE  04/27/2017  . QUADRICEPS TENDON REPAIR Bilateral 06/15/2017   Procedure: REPAIR QUADRICEP TENDON;  Surgeon: Meredith Pel, MD;  Location: [place];  Service: Orthopedics;  Laterality: Bilateral;  . SHOULDER SURGERY Right    Social History   Occupational History  . Not on file.   Social History Main Topics  . Smoking status: Current Every Day Smoker    Types: Cigars  . Smokeless tobacco: Never Used     Comment: celebration use   . Alcohol use 1.8 oz/week    2 Glasses of wine, 1 Shots of liquor per week     Comment: 2 drinks per day  . Drug use: No  . Sexual activity: Not on file

## 2017-10-11 ENCOUNTER — Ambulatory Visit: Payer: Medicare Other | Admitting: Physical Therapy

## 2017-10-11 ENCOUNTER — Encounter: Payer: Self-pay | Admitting: Physical Therapy

## 2017-10-11 DIAGNOSIS — S76111S Strain of right quadriceps muscle, fascia and tendon, sequela: Secondary | ICD-10-CM | POA: Diagnosis not present

## 2017-10-11 DIAGNOSIS — M25561 Pain in right knee: Secondary | ICD-10-CM

## 2017-10-11 DIAGNOSIS — R2689 Other abnormalities of gait and mobility: Secondary | ICD-10-CM | POA: Diagnosis not present

## 2017-10-11 DIAGNOSIS — M25562 Pain in left knee: Secondary | ICD-10-CM | POA: Diagnosis not present

## 2017-10-11 NOTE — Therapy (Signed)
[facility] [address] [place], [place], [address] Phone: [phone]   Fax:  [phone]  Physical Therapy Treatment  Patient Details  Name: Jose [name] MRN: [id number] Date of Birth: 10-31-1951 Referring Provider: Dr Cammy Copa   Encounter Date: 10/11/2017      PT End of Session - 10/11/17 1809    Visit Number 12   Number of Visits 16   Date for PT Re-Evaluation 10/29/17   PT Start Time 1631   PT Stop Time 1735   PT Time Calculation (min) 64 min   Activity Tolerance Patient tolerated treatment well   Behavior During Therapy [facility] for tasks assessed/performed      Past Medical History:  Diagnosis Date  . Arthritis    R shoulder, great toes, hands- has gout & has injections in knees with Dr. Corliss Skains   . Cancer (HCC)    skin ca-   . GERD (gastroesophageal reflux disease)   . History of hiatal hernia   . SDH (subdural hematoma) (HCC)     Past Surgical History:  Procedure Laterality Date  . BRAIN SURGERY  2016   evacuation of SDH  . CARPAL TUNNEL RELEASE Bilateral   . GREEN LIGHT LASER TURP (TRANSURETHRAL RESECTION OF PROSTATE  04/27/2017  . QUADRICEPS TENDON REPAIR Bilateral 06/15/2017   Procedure: REPAIR QUADRICEP TENDON;  Surgeon: Cammy Copa, MD;  Location: [facility];  Service: Orthopedics;  Laterality: Bilateral;  . SHOULDER SURGERY Right     There were no vitals filed for this visit.      Subjective Assessment - 10/11/17 1635    Subjective Saw Md  yesterday.  he has a retear of left quads.  New script says quad strengthening 3 x a week  for 8 weeks.  No pain ,  has a lot of swelling.    Currently in Pain? No/denies   Pain Location Knee   Pain Orientation Left;Right   Pain Relieving Factors ice,                         [facility] Adult PT Treatment/Exercise - 10/11/17 0001      Knee/Hip Exercises: Aerobic   Nustep LE 5 minutes L5     Knee/Hip Exercises: Machines for  Strengthening   Cybex Leg Press 40, 45 X 10   both,, single 1, 1.5 plate 12 ax each     Knee/Hip Exercises: Standing   Forward Step Up 1 set;10 reps;Both   Step Down 1 set;10 reps;Hand Hold: 1;Step Height: 4"  both   Step Down Limitations mod use of cues and hand   Functional Squat Limitations mini squat with 2 step side steps, 10 sets   Wall Squat 1 set;10 reps   Wall Squat Limitations small range     Knee/Hip Exercises: Seated   Long Arc Quad Strengthening  10 X 10-15 second hold each slow lowering.    Long Arc Quad Weight 5 lbs.   Long [employer] Limitations 5   difficult left.  AA into extension     Knee/Hip Exercises: Supine   Quad Sets Limitations with Russian stim   Short Arc [employer] --  with [place] stim   Straight Leg Raises Limitations with Russian stim     Cryotherapy   Number Minutes Cryotherapy 10 Minutes   Cryotherapy Location Knee  both   Type of Cryotherapy --  cold pack     Insurance claims handler  Stimulation Location left quad   Electrical Stimulation Action Russian  cramp with resisted extension right in anterior tib.     Electrical Stimulation Parameters 63 Ma 10 on 20 off    Electrical Stimulation Goals Strength  QS,  SAQ with 2 LB weight,  also 5 reps concurrent with RT                  PT Short Term Goals - 10/11/17 1814      PT SHORT TERM GOAL #1   Title Patient will increase bilateral lower extremity strength to 5/5    Baseline progressing,    Time 4   Period Weeks   Status On-going     PT SHORT TERM GOAL #2   Baseline independent   Time 4   Period Weeks   Status Achieved     PT SHORT TERM GOAL #3   Title Patient will demsotrate 20 second single leg stance bilateral    Time 4   Period Weeks   Status Unable to assess           PT Long Term Goals - 09/26/17 9371      PT LONG TERM GOAL #1   Title Patient will walk 1 moile without self report of pain    Baseline min difficulty   Status Partially Met      PT LONG TERM GOAL #2   Title Patient will demsotrate a 30% limitation on FOTO    Baseline 46%   Status On-going     PT LONG TERM GOAL #3   Title Patient will sleep through the night without increased pain    Baseline did so last night, recently added Baclofen for night time muscle   Status Partially Met     PT LONG TERM GOAL #4   Title Patient will stand for 1 hour without increased pain in order to perfrom daily tasks   Baseline 48 min with min pain    Status Partially Met               Plan - 10/11/17 1810    Clinical Impression Statement Patient found out he has a tear in Left quads.  MD suggested 3 X a week for 8 weeks to strengthen Left quad, as tolerated .  Modalities as needed,  25% retear Left quad  I think script also says: ( will strengthen the rest)  Increased strengthening today . Patient was carefully monitored so all exercises were pain free.  No increased pain at end of session.  Rt anterior tib had 1 cramp with resisted SAQ.   PT Treatment/Interventions ADLs/Self Care Home Management;Cryotherapy;Electrical Stimulation;Iontophoresis '4mg'$ /ml Dexamethasone;Stair training;Gait training;Moist Heat;Therapeutic activities;Therapeutic exercise;Neuromuscular re-education;Patient/family education;Passive range of motion;Manual techniques;Taping;Ultrasound   PT Next Visit Plan continue with steps; SLS, knee control ,  ; progress weights and bands as tolerated. Terminal knee extension.  Repeat [place] ,  Quad strength as tolerated.   PT Home Exercise Plan SLR; bridge, LAQ yellow; hamstring yellow; standing march    Consulted and Agree with Plan of Care Patient      Patient will benefit from skilled therapeutic intervention in order to improve the following deficits and impairments:     Visit Diagnosis: Acute pain of right knee  Acute pain of left knee  Other abnormalities of gait and mobility  Rupture of quadriceps tendon, right, sequela     Problem List Patient  Active Problem List   Diagnosis Date Noted  . Candidiasis   .  Abnormality of gait   . Hypoalbuminemia due to protein-calorie malnutrition ([place])   . History of gout   . Sleep disturbance   . Constipation   . Rupture of quadriceps tendon, right, sequela 06/19/2017  . Quadriceps tendon rupture, left, sequela 06/19/2017  . Acute blood loss anemia 06/19/2017  . Fall   . History of subdural hematoma   . Post-operative pain   . Recurrent falls   . Quadriceps tendon rupture 06/15/2017  . Primary osteoarthritis of both knees 02/28/2017  . Primary osteoarthritis of both hands 02/28/2017  . Uricacidemia 02/28/2017  . Pain in joint of left knee 02/07/2017  . Idiopathic chronic gout, unspecified site, without tophus (tophi) 11/29/2016  . HEMATURIA UNSPECIFIED 01/25/2009  . ABDOMINAL PAIN 01/25/2009  . XERODERMA 03/10/2008  . UNS ADVRS EFF UNS RX MEDICINAL&BIOLOGICAL SBSTNC 03/10/2008  . ADJUSTMENT REACTION WITH PHYSICAL SYMPTOMS 02/14/2008  . MUSCLE SPASM 02/14/2008  . ACUTE PROSTATITIS 12/16/2007  . BREAST LUMP OR MASS, RIGHT 07/12/2007  . H/O: RCT (rotator cuff tear) 01/13/2007  . GOUT 01/08/2007    HARRIS,KAREN PTA 10/11/2017, 6:15 PM  [place], [place], [address] Phone: [phone]   Fax:  [phone]  Name: Jose [name] MRN: [id number] Date of Birth: 1951/07/21

## 2017-10-15 ENCOUNTER — Ambulatory Visit: Payer: Medicare Other | Admitting: Physical Therapy

## 2017-10-15 ENCOUNTER — Encounter: Payer: Self-pay | Admitting: Physical Therapy

## 2017-10-15 DIAGNOSIS — M25561 Pain in right knee: Secondary | ICD-10-CM

## 2017-10-15 DIAGNOSIS — R2689 Other abnormalities of gait and mobility: Secondary | ICD-10-CM

## 2017-10-15 DIAGNOSIS — M25562 Pain in left knee: Secondary | ICD-10-CM | POA: Diagnosis not present

## 2017-10-15 DIAGNOSIS — S76111S Strain of right quadriceps muscle, fascia and tendon, sequela: Secondary | ICD-10-CM | POA: Diagnosis not present

## 2017-10-15 NOTE — Therapy (Signed)
[place], [place], [address] Phone: [phone]   Fax:  [phone]  Physical Therapy Treatment  Patient Details  Name: Jose [name] MRN: [id number] Date of Birth: 1951-10-11 Referring Provider: Dr Meredith Pel   Encounter Date: 10/15/2017      PT End of Session - 10/15/17 1147    Visit Number 13   Number of Visits 16   Date for PT Re-Evaluation 10/29/17   Authorization Type Blue cross blue shield    PT Start Time 1145   PT Stop Time 1230   PT Time Calculation (min) 45 min   Activity Tolerance Patient tolerated treatment well   Behavior During Therapy [facility] for tasks assessed/performed      Past Medical History:  Diagnosis Date  . Arthritis    R shoulder, great toes, hands- has gout & has injections in knees with Dr. Estanislado Pandy   . Cancer ([place])    skin ca-   . GERD (gastroesophageal reflux disease)   . History of hiatal hernia   . SDH (subdural hematoma) (HCC)     Past Surgical History:  Procedure Laterality Date  . BRAIN SURGERY  2016   evacuation of SDH  . CARPAL TUNNEL RELEASE Bilateral   . GREEN LIGHT LASER TURP (TRANSURETHRAL RESECTION OF PROSTATE  04/27/2017  . QUADRICEPS TENDON REPAIR Bilateral 06/15/2017   Procedure: REPAIR QUADRICEP TENDON;  Surgeon: Meredith Pel, MD;  Location: [place];  Service: Orthopedics;  Laterality: Bilateral;  . SHOULDER SURGERY Right     There were no vitals filed for this visit.      Subjective Assessment - 10/15/17 1200    Subjective Pain at night is imprving. He has been riding a bike and improving his mile time. He has not had swelling since PTA did lymphnode therapy.    Limitations Lifting   How long can you stand comfortably? < 20 minutes    Diagnostic tests nothing post op    Currently in Pain? No/denies                         [facility] Adult PT Treatment/Exercise - 10/15/17 0001      Knee/Hip Exercises: Aerobic    Nustep LE 5 minutes L5     Knee/Hip Exercises: Machines for Strengthening   Cybex Leg Press 60# x20; 20lb left 2x10; right 2x10 40#      Knee/Hip Exercises: Standing   Hip Flexion Limitations standing march 2x10 2lb weight    Forward Step Up Limitations 2x10 6 inch    Step Down 1 set;10 reps;Hand Hold: 1;Step Height: 4"  both   Step Down Limitations mod use of cues and hand   Functional Squat Limitations mini squat with 2 step side steps, 10 sets   Wall Squat 1 set;10 reps   Wall Squat Limitations small range   SLS single legf stance 4x15 second hold bilateral                 PT Education - 10/15/17 1159    Education provided Yes   Education Details reviewed tehchnique with ther-ex    Person(s) Educated Patient   Methods Explanation   Comprehension Verbalized understanding;Returned demonstration;Verbal cues required;Tactile cues required          PT Short Term Goals - 10/11/17 1814      PT SHORT TERM GOAL #1   Title Patient will increase bilateral lower extremity strength to 5/5  Baseline progressing,    Time 4   Period Weeks   Status On-going     PT SHORT TERM GOAL #2   Baseline independent   Time 4   Period Weeks   Status Achieved     PT SHORT TERM GOAL #3   Title Patient will demsotrate 20 second single leg stance bilateral    Time 4   Period Weeks   Status Unable to assess           PT Long Term Goals - 09/26/17 7915      PT LONG TERM GOAL #1   Title Patient will walk 1 moile without self report of pain    Baseline min difficulty   Status Partially Met     PT LONG TERM GOAL #2   Title Patient will demsotrate a 30% limitation on FOTO    Baseline 46%   Status On-going     PT LONG TERM GOAL #3   Title Patient will sleep through the night without increased pain    Baseline did so last night, recently added Baclofen for night time muscle   Status Partially Met     PT LONG TERM GOAL #4   Title Patient will stand for 1 hour without  increased pain in order to perfrom daily tasks   Baseline 48 min with min pain    Status Partially Met               Plan - 10/15/17 1200    Clinical Impression Statement Patient tolerated treatment well. he had no increase in pain with exercises. He continues to have a 10- degree extensor lag. Therapy will continue to progress exercises as tolerated. Therapy added single leg stance to HEP.    Rehab Potential Good   PT Frequency 2x / week   PT Duration 8 weeks   PT Treatment/Interventions ADLs/Self Care Home Management;Cryotherapy;Electrical Stimulation;Iontophoresis 63m/ml Dexamethasone;Stair training;Gait training;Moist Heat;Therapeutic activities;Therapeutic exercise;Neuromuscular re-education;Patient/family education;Passive range of motion;Manual techniques;Taping;Ultrasound   PT Next Visit Plan continue with steps; SLS, knee control ,  ; progress weights and bands as tolerated. Terminal knee extension.  Repeat [place],  Quad strength as tolerated.   PT Home Exercise Plan SLR; bridge, LAQ yellow; hamstring yellow; standing march    Consulted and Agree with Plan of Care Patient      Patient will benefit from skilled therapeutic intervention in order to improve the following deficits and impairments:  Abnormal gait, Decreased range of motion, Pain, Decreased strength, Decreased activity tolerance, Impaired UE functional use, Decreased knowledge of use of DME  Visit Diagnosis: Acute pain of right knee  Acute pain of left knee  Other abnormalities of gait and mobility     Problem List Patient Active Problem List   Diagnosis Date Noted  . Candidiasis   . Abnormality of gait   . Hypoalbuminemia due to protein-calorie malnutrition ([place]   . History of gout   . Sleep disturbance   . Constipation   . Rupture of quadriceps tendon, right, sequela 06/19/2017  . Quadriceps tendon rupture, left, sequela 06/19/2017  . Acute blood loss anemia 06/19/2017  . Fall   . History of  subdural hematoma   . Post-operative pain   . Recurrent falls   . Quadriceps tendon rupture 06/15/2017  . Primary osteoarthritis of both knees 02/28/2017  . Primary osteoarthritis of both hands 02/28/2017  . Uricacidemia 02/28/2017  . Pain in joint of left knee 02/07/2017  . Idiopathic chronic gout, unspecified site,  without tophus (tophi) 11/29/2016  . HEMATURIA UNSPECIFIED 01/25/2009  . ABDOMINAL PAIN 01/25/2009  . XERODERMA 03/10/2008  . UNS ADVRS EFF UNS RX MEDICINAL&BIOLOGICAL SBSTNC 03/10/2008  . ADJUSTMENT REACTION WITH PHYSICAL SYMPTOMS 02/14/2008  . MUSCLE SPASM 02/14/2008  . ACUTE PROSTATITIS 12/16/2007  . BREAST LUMP OR MASS, RIGHT 07/12/2007  . H/O: RCT (rotator cuff tear) 01/13/2007  . GOUT 01/08/2007    Carney Living PT DTP  10/15/2017, 2:33 PM  [facility] [address] [place], [place], [address] Phone: [phone]   Fax:  [phone]  Name: VALERIA KRISKO MRN: [id number] Date of Birth: [birth year]

## 2017-10-16 ENCOUNTER — Encounter: Payer: Self-pay | Admitting: Physical Therapy

## 2017-10-16 ENCOUNTER — Ambulatory Visit: Payer: Medicare Other | Admitting: Physical Therapy

## 2017-10-16 DIAGNOSIS — R2689 Other abnormalities of gait and mobility: Secondary | ICD-10-CM | POA: Diagnosis not present

## 2017-10-16 DIAGNOSIS — M25561 Pain in right knee: Secondary | ICD-10-CM | POA: Diagnosis not present

## 2017-10-16 DIAGNOSIS — S76111S Strain of right quadriceps muscle, fascia and tendon, sequela: Secondary | ICD-10-CM | POA: Diagnosis not present

## 2017-10-16 DIAGNOSIS — M25562 Pain in left knee: Secondary | ICD-10-CM

## 2017-10-16 NOTE — Therapy (Signed)
[place], [place], [address] Phone: [phone]   Fax:  [phone]  Physical Therapy Treatment  Patient Details  Name: Jose [name] MRN: [id number] Date of Birth: 02/03/1951 Referring Provider: Dr Meredith Pel   Encounter Date: 10/16/2017      PT End of Session - 10/16/17 1310    Visit Number 14   Number of Visits 16   Date for PT Re-Evaluation 10/29/17   PT Start Time 1150   PT Stop Time 1241   PT Time Calculation (min) 51 min   Activity Tolerance Patient tolerated treatment well   Behavior During Therapy [facility] for tasks assessed/performed      Past Medical History:  Diagnosis Date  . Arthritis    R shoulder, great toes, hands- has gout & has injections in knees with Dr. Estanislado Pandy   . Cancer ([employer])    skin ca-   . GERD (gastroesophageal reflux disease)   . History of hiatal hernia   . SDH (subdural hematoma) (HCC)     Past Surgical History:  Procedure Laterality Date  . BRAIN SURGERY  2016   evacuation of SDH  . CARPAL TUNNEL RELEASE Bilateral   . GREEN LIGHT LASER TURP (TRANSURETHRAL RESECTION OF PROSTATE  04/27/2017  . QUADRICEPS TENDON REPAIR Bilateral 06/15/2017   Procedure: REPAIR QUADRICEP TENDON;  Surgeon: Meredith Pel, MD;  Location: [place];  Service: Orthopedics;  Laterality: Bilateral;  . SHOULDER SURGERY Right     There were no vitals filed for this visit.      Subjective Assessment - 10/16/17 1156    Subjective Edema cxontinues to improved.  consistantl                         [facility] Adult PT Treatment/Exercise - 10/16/17 0001      Knee/Hip Exercises: Aerobic   Nustep LE 5 minutes L5     Knee/Hip Exercises: Machines for Strengthening   Cybex Knee Flexion 55 Lbs both 10 x 2 sets   Cybex Leg Press 60# x20; 20lb left 2x10; right 2x10 40#      Knee/Hip Exercises: Standing   Heel Raises Limitations heel lifts,  tip toe walk forward/reverse.   cued to keep knees a little flexed,  This was difficult   Hip Flexion Limitations standing march 2x10 2lb weight    SLS with Vectors SLS with green ball toss /catch , 3 in row best for left,  ,  Both.  Min assist for balance needed  a few times.   Walking with Sports Cord 5 x forward 17 LBS.  cued for keeping knee slightly flexed.    Revers up to 23 LBS  with cues for terminal knee control     Cryotherapy   Number Minutes Cryotherapy 10 Minutes   Cryotherapy Location Knee  both   Type of Cryotherapy --  cold packs                PT Education - 10/16/17 1310    Education provided No          PT Short Term Goals - 10/11/17 1814      PT SHORT TERM GOAL #1   Title Patient will increase bilateral lower extremity strength to 5/5    Baseline progressing,    Time 4   Period Weeks   Status On-going     PT SHORT TERM GOAL #2   Baseline independent  Time 4   Period Weeks   Status Achieved     PT SHORT TERM GOAL #3   Title Patient will demsotrate 20 second single leg stance bilateral    Time 4   Period Weeks   Status Unable to assess           PT Long Term Goals - 09/26/17 0086      PT LONG TERM GOAL #1   Title Patient will walk 1 moile without self report of pain    Baseline min difficulty   Status Partially Met     PT LONG TERM GOAL #2   Title Patient will demsotrate a 30% limitation on FOTO    Baseline 46%   Status On-going     PT LONG TERM GOAL #3   Title Patient will sleep through the night without increased pain    Baseline did so last night, recently added Baclofen for night time muscle   Status Partially Met     PT LONG TERM GOAL #4   Title Patient will stand for 1 hour without increased pain in order to perfrom daily tasks   Baseline 48 min with min pain    Status Partially Met               Plan - 10/16/17 1311    Clinical Impression Statement Patient continues to tolerate exercise difficulty progression.  He noted shakey hips and  knees post session.  No pain.  Balance was an issue with plyotoss and SLS.  Min assist needed 3 x to avoid falls.    PT Treatment/Interventions ADLs/Self Care Home Management;Cryotherapy;Electrical Stimulation;Iontophoresis 53m/ml Dexamethasone;Stair training;Gait training;Moist Heat;Therapeutic activities;Therapeutic exercise;Neuromuscular re-education;Patient/family education;Passive range of motion;Manual techniques;Taping;Ultrasound   PT Next Visit Plan continue with steps; SLS, knee control ,  ; progress weights and bands as tolerated. Terminal knee extension.  Repeat [place],  Quad strength as tolerated.   PT Home Exercise Plan SLR; bridge, LAQ yellow; hamstring yellow; standing march    Consulted and Agree with Plan of Care Patient      Patient will benefit from skilled therapeutic intervention in order to improve the following deficits and impairments:     Visit Diagnosis: Acute pain of right knee  Acute pain of left knee  Other abnormalities of gait and mobility  Rupture of quadriceps tendon, right, sequela     Problem List Patient Active Problem List   Diagnosis Date Noted  . Candidiasis   . Abnormality of gait   . Hypoalbuminemia due to protein-calorie malnutrition ([place]   . History of gout   . Sleep disturbance   . Constipation   . Rupture of quadriceps tendon, right, sequela 06/19/2017  . Quadriceps tendon rupture, left, sequela 06/19/2017  . Acute blood loss anemia 06/19/2017  . Fall   . History of subdural hematoma   . Post-operative pain   . Recurrent falls   . Quadriceps tendon rupture 06/15/2017  . Primary osteoarthritis of both knees 02/28/2017  . Primary osteoarthritis of both hands 02/28/2017  . Uricacidemia 02/28/2017  . Pain in joint of left knee 02/07/2017  . Idiopathic chronic gout, unspecified site, without tophus (tophi) 11/29/2016  . HEMATURIA UNSPECIFIED 01/25/2009  . ABDOMINAL PAIN 01/25/2009  . XERODERMA 03/10/2008  . UNS ADVRS EFF UNS RX  MEDICINAL&BIOLOGICAL SBSTNC 03/10/2008  . ADJUSTMENT REACTION WITH PHYSICAL SYMPTOMS 02/14/2008  . MUSCLE SPASM 02/14/2008  . ACUTE PROSTATITIS 12/16/2007  . BREAST LUMP OR MASS, RIGHT 07/12/2007  . H/O: RCT (rotator cuff  tear) 01/13/2007  . GOUT 01/08/2007    HARRIS,KAREN PTA 10/16/2017, 1:14 PM  [place], [place], [address] Phone: [phone]   Fax:  [phone]  Name: SHOAIB SIEFKER MRN: [id number] Date of Birth: 1951-12-13

## 2017-10-16 NOTE — Progress Notes (Signed)
Patient ID: Jose [name], male   DOB: Mar 18, 1951, 66 y.o.   MRN: [id number]     Cardiology Office Note   Date:  10/17/2017   ID:  Jose [name], DOB 1951/07/18, MRN [id number]  PCP:  [name], MD  Cardiologist:   [name], MD   No chief complaint on file.     History of Present Illness: Jose [name] is a 66 y.o. male who presents for f/u vascular disease and bicuspid AV with moderate AS   Followed by [name] for ascending aortic dilatations.  Reviewed CTA 12/28/15 and measured stable 4.5 cm  The patient has a history of intracranial bleed/ subdural hematoma without history of trauma treated in [place]. He underwent right craniotomy. 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 There is no family history of aortic aneurysms, unexplained sudden death at an early age or aortic dissection.He has one cousin who died in his 37s of sudden collapse thought to be a brain aneurysm  Echo 02/21/17 reviewed Study Conclusions  - Left ventricle: The cavity size was normal. Systolic function was   normal. The estimated ejection fraction was in the range of 50%   to 55%. Wall motion was normal; there were no regional wall   motion abnormalities. Doppler parameters are consistent with   abnormal left ventricular relaxation (grade 1 diastolic   dysfunction). - Aortic valve: Bicuspid; normal thickness leaflets. There was   moderate regurgitation. Mean gradient (S): 22 mm Hg. Peak   gradient (S): 35 mm Hg. Valve area (VTI): 1.66 cm^2. Valve area   (Vmax): 1.8 cm^2. Valve area (Vmean): 1.53 cm^2. - Aortic root: The aortic root was mildly dilated measuring 40 mm. - Ascending aorta: The ascending aorta was dilated measuring 45 mm. - Mitral valve: There was trivial  regurgitation. - Left atrium: The atrium was mildly dilated. - Right ventricle: Systolic function was normal. - Tricuspid valve: There was no regurgitation. - Pulmonary arteries: Systolic pressure could not be accurately   estimated. - Inferior vena cava: The vessel was normal in size.  Impressions:  - Compared to the prior study on 02/07/2016 aortic stenosis is now   in moderate range.   LVEF appears mildly decreased at 50-55%  Had a nonischemic myovue 02/21/17 EF estimated 46% but low normal on echo .  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 Arthritis    R shoulder, great toes, hands- has gout & has injections in knees with Dr. Estanislado Pandy   . Cancer ([place])    skin ca-   . GERD (gastroesophageal reflux disease)   . History of hiatal hernia   . SDH (subdural hematoma) (HCC)     Past Surgical History:  Procedure Laterality Date  . BRAIN SURGERY  2016   evacuation of SDH  . CARPAL TUNNEL RELEASE Bilateral   . GREEN LIGHT LASER TURP (TRANSURETHRAL RESECTION OF PROSTATE  04/27/2017  . QUADRICEPS TENDON REPAIR Bilateral 06/15/2017   Procedure: REPAIR QUADRICEP  TENDON;  Surgeon: Meredith Pel, MD;  Location: [place];  Service: Orthopedics;  Laterality: Bilateral;  . SHOULDER SURGERY Right      Current Outpatient Prescriptions  Medication Sig Dispense Refill  . acetaminophen (TYLENOL) 500 MG tablet Take 500-1,000 mg by mouth every 6 (six) hours as needed for mild pain.    Marland Kitchen allopurinol (ZYLOPRIM) 300 MG tablet Take 1 tablet (300 mg total) by mouth daily. 90 tablet 1  . baclofen (LIORESAL) 10 MG tablet Take 1 tablet (10 mg total) by mouth 2 (two) times daily. 60 each 1  . colchicine 0.6 MG tablet Take 1 tablet  (0.6 mg total) by mouth daily as needed (FOR GOUT FLAREUPS). 30 tablet 3  . diclofenac sodium (VOLTAREN) 1 % GEL Apply 4 g topically 4 (four) times daily. 3 Tube 1  . esomeprazole (NEXIUM) 40 MG capsule Take 1 capsule (40 mg total) by mouth daily. 30 capsule 0  . Multiple Vitamin (MULTIVITAMIN WITH MINERALS) TABS tablet Take 1 tablet by mouth daily.     No current facility-administered medications for this visit.     Allergies:   Patient has no known allergies.    Social History:  The patient  reports that he has been smoking Cigars.  He has never used smokeless tobacco. He reports that he drinks about 1.8 oz of alcohol per week . He reports that he does not use drugs.   Family History:  The patient's family history includes Diabetes in his brother; Heart disease in his father; Parkinson's disease in his brother and mother.    ROS:  Please see the history of present illness.   Otherwise, review of systems are positive for none.   All other systems are reviewed and negative.    PHYSICAL EXAM: VS:  BP 132/70   Pulse (!) 56   Ht 6' (1.829 m)   Wt 202 lb 12.8 oz (92 kg)   SpO2 97%   BMI 27.50 kg/m  , BMI Body mass index is 27.5 kg/m. Affect appropriate Healthy:  appears stated age 1: normal Neck supple with no adenopathy JVP normal no bruits no thyromegaly Lungs clear with no wheezing and good diaphragmatic motion Heart:  S1/S2 spplit SEM murmur, no rub, gallop or click PMI normal Abdomen: benighn, BS positve, no tenderness, no AAA no bruit.  No HSM or HJR Distal pulses intact with no bruits No edema Neuro non-focal Skin warm and dry Post bilateral knee replacements with brace on left still     EKG:  06/27/15  SR rate 52  Low voltage septal infarct ? Lead placement  02/07/16  SR rate 87  Nonspecific ST changes 02/07/17  SR rate 97 ? Old anterior MI    Recent Labs: 09/04/2017: ALT 17; BUN 20; Creat 1.08; Hemoglobin 14.1; Platelets 239; Potassium 4.4; Sodium 141     Lipid Panel    Component Value Date/Time   CHOL 243 (HH) 12/25/2007 1636   TRIG 126 12/25/2007 1636   HDL 66.4 12/25/2007 1636   CHOLHDL 3.7 CALC 12/25/2007 1636   VLDL 25 12/25/2007 1636   LDLDIRECT 164.4 12/25/2007 1636      Wt Readings from Last 3 Encounters:  10/17/17 202 lb 12.8 oz (92 kg)  09/04/17 194 lb (88 kg)  06/16/17 197 lb (89.4 kg)      Other studies Reviewed: Additional studies/ records that were reviewed today include:  Epic notes CTA, ECG Notes from [name] see HPI.    ASSESSMENT AND PLAN:  1.  Aortic Aneurysm:  Now in context of bicuspid valve will need to be followed closer per [name] 4.5 cm CTA 01/04/17 stable  Continue Toprol 25 mg daily  Repeat CT January 2019 if stable consider f/u with MRI/MRA 2. Subdural:  Spontaneous avoid antiplatelet agents follow vertebral artery with ? Duplex per neuro 3. Family history CAD.  Non ischemic myovue 02/21/17 can image coronary arteries with cardiac CTA at time of aneurysm imaging  4. Bicuspid AV:  Moderate AS mean gradient 22 mmHg f/u echo March 2019    [name]

## 2017-10-16 NOTE — Patient Instructions (Addendum)
Take an easier day of exercise tomorrow.

## 2017-10-17 ENCOUNTER — Encounter: Payer: Self-pay | Admitting: Cardiovascular Disease

## 2017-10-17 ENCOUNTER — Ambulatory Visit (INDEPENDENT_AMBULATORY_CARE_PROVIDER_SITE_OTHER): Payer: Medicare Other | Admitting: Cardiovascular Disease

## 2017-10-17 VITALS — BP 132/70 | HR 56 | Ht 72.0 in | Wt 202.8 lb

## 2017-10-17 DIAGNOSIS — Q2381 Bicuspid aortic valve: Secondary | ICD-10-CM

## 2017-10-17 DIAGNOSIS — I350 Nonrheumatic aortic (valve) stenosis: Secondary | ICD-10-CM | POA: Diagnosis not present

## 2017-10-17 DIAGNOSIS — Q231 Congenital insufficiency of aortic valve: Secondary | ICD-10-CM | POA: Diagnosis not present

## 2017-10-17 NOTE — Patient Instructions (Addendum)
Medication Instructions:  Your physician recommends that you continue on your current medications as directed. Please refer to the Current Medication list given to you today.  Labwork: Your physician recommends that you return for lab work in: January, [place] before cardiac CT  Testing/Procedures: Your physician has requested that you have an echocardiogram. Echocardiography is a painless test that uses sound waves to create images of your heart. It provides your doctor with information about the size and shape of your heart and how well your heart's chambers and valves are working. This procedure takes approximately one hour. There are no restrictions for this procedure.  Your physician has requested that you have cardiac CT. Cardiac computed tomography (CT) is a painless test that uses an x-ray machine to take clear, detailed pictures of your heart. For further information please visit [email or web address]tn. Please follow instruction sheet as given.  Follow-Up: Your physician wants you to follow-up in: 12 months with Dr. Johnsie Cancel. You will receive a reminder letter in the mail two months in advance. If you don't receive a letter, please call our office to schedule the follow-up appointment.   If you need a refill on your cardiac medications before your next appointment, please call your pharmacy.   Please arrive at the [facility] main entrance of [facility] at xx:xx AM (30-45 minutes prior to test start time)  [facility] [address] [place], [place] [address] [phone]  Proceed to the [facility] Radiology Department (First Floor).  Please follow these instructions carefully (unless otherwise directed):  Hold all erectile dysfunction medications at least 48 hours prior to test.  On the Night Before the Test: . Drink plenty of water. . Do not consume any caffeinated/decaffeinated beverages or chocolate 12 hours prior to your test. . Do not take any  antihistamines 12 hours prior to your test.  On the Day of the Test: . Drink plenty of water. Do not drink any water within one hour of the test. . Do not eat any food 4 hours prior to the test. . You may take your regular medications prior to the test. . IF NOT ON A BETA BLOCKER - Take 50 mg of lopressor (metoprolol) one hour before the test. After the Test: . Drink plenty of water. . After receiving IV contrast, you may experience a mild flushed feeling. This is normal. . On occasion, you may experience a mild rash up to 24 hours after the test. This is not dangerous. If this occurs, you can take Benadryl 25 mg and increase your fluid intake. . If you experience trouble breathing, this can be serious. If it is severe call 911 IMMEDIATELY. If it is mild, please call our office.

## 2017-10-18 ENCOUNTER — Encounter: Payer: Self-pay | Admitting: Physical Therapy

## 2017-10-18 ENCOUNTER — Ambulatory Visit (INDEPENDENT_AMBULATORY_CARE_PROVIDER_SITE_OTHER): Payer: Medicare Other | Admitting: Orthopedic Surgery

## 2017-10-18 ENCOUNTER — Ambulatory Visit: Payer: Medicare Other | Attending: Internal Medicine | Admitting: Physical Therapy

## 2017-10-18 DIAGNOSIS — R2689 Other abnormalities of gait and mobility: Secondary | ICD-10-CM | POA: Diagnosis not present

## 2017-10-18 DIAGNOSIS — M25561 Pain in right knee: Secondary | ICD-10-CM

## 2017-10-18 DIAGNOSIS — M25562 Pain in left knee: Secondary | ICD-10-CM | POA: Diagnosis not present

## 2017-10-18 DIAGNOSIS — X58XXXS Exposure to other specified factors, sequela: Secondary | ICD-10-CM | POA: Insufficient documentation

## 2017-10-18 DIAGNOSIS — S76111S Strain of right quadriceps muscle, fascia and tendon, sequela: Secondary | ICD-10-CM

## 2017-10-18 NOTE — Patient Instructions (Signed)
Make an appointment with MD if there is a concern about quad divot.

## 2017-10-18 NOTE — Therapy (Signed)
[address] [place], [place], [address] Phone: [phone]   Fax:  [phone]  Physical Therapy Treatment  Patient Details  Name: Jose [name] MRN: [id number] Date of Birth: 1951/11/15 Referring Provider: Dr Meredith Pel   Encounter Date: 10/18/2017      PT End of Session - 10/18/17 1259    Visit Number 15   Number of Visits 16   Date for PT Re-Evaluation 10/29/17   PT Start Time 1104   PT Stop Time 1157   PT Time Calculation (min) 53 min   Behavior During Therapy [facility] for tasks assessed/performed      Past Medical History:  Diagnosis Date  . Arthritis    R shoulder, great toes, hands- has gout & has injections in knees with Dr. Estanislado Pandy   . Cancer ([place])    skin ca-   . GERD (gastroesophageal reflux disease)   . History of hiatal hernia   . SDH (subdural hematoma) (HCC)     Past Surgical History:  Procedure Laterality Date  . BRAIN SURGERY  2016   evacuation of SDH  . CARPAL TUNNEL RELEASE Bilateral   . GREEN LIGHT LASER TURP (TRANSURETHRAL RESECTION OF PROSTATE  04/27/2017  . QUADRICEPS TENDON REPAIR Bilateral 06/15/2017   Procedure: REPAIR QUADRICEP TENDON;  Surgeon: Meredith Pel, MD;  Location: [facility];  Service: Orthopedics;  Laterality: Bilateral;  . SHOULDER SURGERY Right     There were no vitals filed for this visit.      Subjective Assessment - 10/18/17 1110    Subjective   I feel my quads are weaker,  I feel my divot is getting bigger.   I noticed Tuesday evening  I was weaker    I noticed the divit yesterday.My hips are starting to get defination.     Currently in Pain? Yes   Pain Score --  4/10   right,  7/LT stiffness   Pain Location Knee   Pain Orientation Right;Left   Pain Descriptors / Indicators --  stiff                         [facility] Adult PT Treatment/Exercise - 10/18/17 0001      Knee/Hip Exercises: Aerobic   Nustep 8 minutes L3,  decrease  to 3 to decrease sting eft     Knee/Hip Exercises: Machines for Strengthening   Cybex Knee Flexion 55 Lbs both 10 x 3 sets     Knee/Hip Exercises: Standing   Heel Raises Limitations walking on heels and on toes.   10 feet about 5 X     Knee/Hip Exercises: Supine   Quad Sets 5 reps   Bridges Limitations 10 x both   Bridges with Clamshell 10 reps  green band   Single Leg Bridge 1 set;10 reps;Right  not with left.   Straight Leg Raises 1 set;10 reps  2 LBS,  each cued    Straight Leg Raises Limitations quad lagboth Left >right.     Cryotherapy   Number Minutes Cryotherapy 10 Minutes   Cryotherapy Location Knee  both,  eletavated   Type of Cryotherapy --  cold pack                PT Education - 10/18/17 1258    Education provided No          PT Short Term Goals - 10/11/17 1814      PT SHORT TERM  GOAL #1   Title Patient will increase bilateral lower extremity strength to 5/5    Baseline progressing,    Time 4   Period Weeks   Status On-going     PT SHORT TERM GOAL #2   Baseline independent   Time 4   Period Weeks   Status Achieved     PT SHORT TERM GOAL #3   Title Patient will demsotrate 20 second single leg stance bilateral    Time 4   Period Weeks   Status Unable to assess           PT Long Term Goals - 09/26/17 6063      PT LONG TERM GOAL #1   Title Patient will walk 1 moile without self report of pain    Baseline min difficulty   Status Partially Met     PT LONG TERM GOAL #2   Title Patient will demsotrate a 30% limitation on FOTO    Baseline 46%   Status On-going     PT LONG TERM GOAL #3   Title Patient will sleep through the night without increased pain    Baseline did so last night, recently added Baclofen for night time muscle   Status Partially Met     PT LONG TERM GOAL #4   Title Patient will stand for 1 hour without increased pain in order to perfrom daily tasks   Baseline 48 min with min pain    Status Partially Met                Plan - 10/18/17 1259    Clinical Impression Statement Patient concerened about his divot getting bigger.  It is difficult to assess .  It is possible it appeard bigger due to decreased edema.  Patellar movement superiorly with quad contraction is as good and may be slightly improved since I last assessed.   patient to call MD for assessment.Less intense workout today per PT Shanon Brow.  No increased pain with exerise except   "Stinging"  both quads  Right 4/10 and Left 6/10.  No pain after exercise prior to cold pack.    PT Treatment/Interventions ADLs/Self Care Home Management;Cryotherapy;Electrical Stimulation;Iontophoresis 35m/ml Dexamethasone;Stair training;Gait training;Moist Heat;Therapeutic activities;Therapeutic exercise;Neuromuscular re-education;Patient/family education;Passive range of motion;Manual techniques;Taping;Ultrasound   PT Next Visit Plan   See what MD said.  Exercise progression as tolerated. continue with steps; SLS, knee control ,  ; progress weights and bands as tolerated. Terminal knee extension.  [place]peat [place],  Quad strength as tolerated.   PT Home Exercise Plan SLR; bridge, LAQ yellow; hamstring yellow; standing march    Consulted and Agree with Plan of Care Patient      Patient will benefit from skilled therapeutic intervention in order to improve the following deficits and impairments:     Visit Diagnosis: Acute pain of right knee  Acute pain of left knee  Other abnormalities of gait and mobility  Rupture of quadriceps tendon, right, sequela     Problem List Patient Active Problem List   Diagnosis Date Noted  . Candidiasis   . Abnormality of gait   . Hypoalbuminemia due to protein[place]calorie malnutrition ([place]   . History of gout   . Sleep disturbance   . Constipation   . Rupture of quadriceps tendon, right, sequela 06/19/2017  . Quadriceps tendon rupture, left, sequela 06/19/2017  . Acute blood loss anemia 06/19/2017  . Fall   .  History of subdural hematoma   . Post-operative pain   .  Recurrent falls   . Quadriceps tendon rupture 06/15/2017  . Primary osteoarthritis of both knees 02/28/2017  . Primary osteoarthritis of both hands 02/28/2017  . Uricacidemia 02/28/2017  . Pain in joint of left knee 02/07/2017  . Idiopathic chronic gout, unspecified site, without tophus (tophi) 11/29/2016  . HEMATURIA UNSPECIFIED 01/25/2009  . ABDOMINAL PAIN 01/25/2009  . XERODERMA 03/10/2008  . UNS ADVRS EFF UNS RX MEDICINAL&BIOLOGICAL SBSTNC 03/10/2008  . ADJUSTMENT REACTION WITH PHYSICAL SYMPTOMS 02/14/2008  . MUSCLE SPASM 02/14/2008  . ACUTE PROSTATITIS 12/16/2007  . BREAST LUMP OR MASS, RIGHT 07/12/2007  . H/O: RCT (rotator cuff tear) 01/13/2007  . GOUT 01/08/2007    Quin Mathenia PTA 10/18/2017, 1:04 PM  [facility] [address] [place], [place], [address] Phone: [phone]   Fax:  [phone]  Name: JOANDY BURGET MRN: [id number] Date of Birth: [birth year]

## 2017-10-19 ENCOUNTER — Encounter: Payer: Self-pay | Admitting: Physical Medicine & Rehabilitation

## 2017-10-19 ENCOUNTER — Encounter: Payer: Medicare Other | Attending: Physical Medicine & Rehabilitation | Admitting: Physical Medicine & Rehabilitation

## 2017-10-19 VITALS — BP 112/80 | HR 69

## 2017-10-19 DIAGNOSIS — S76112S Strain of left quadriceps muscle, fascia and tendon, sequela: Secondary | ICD-10-CM | POA: Diagnosis not present

## 2017-10-19 DIAGNOSIS — M170 Bilateral primary osteoarthritis of knee: Secondary | ICD-10-CM | POA: Insufficient documentation

## 2017-10-19 DIAGNOSIS — S76111S Strain of right quadriceps muscle, fascia and tendon, sequela: Secondary | ICD-10-CM | POA: Diagnosis not present

## 2017-10-19 DIAGNOSIS — F17290 Nicotine dependence, other tobacco product, uncomplicated: Secondary | ICD-10-CM | POA: Diagnosis not present

## 2017-10-19 DIAGNOSIS — M62838 Other muscle spasm: Secondary | ICD-10-CM | POA: Diagnosis not present

## 2017-10-19 DIAGNOSIS — Z09 Encounter for follow-up examination after completed treatment for conditions other than malignant neoplasm: Secondary | ICD-10-CM | POA: Insufficient documentation

## 2017-10-19 DIAGNOSIS — G479 Sleep disorder, unspecified: Secondary | ICD-10-CM | POA: Diagnosis not present

## 2017-10-19 DIAGNOSIS — K219 Gastro-esophageal reflux disease without esophagitis: Secondary | ICD-10-CM | POA: Insufficient documentation

## 2017-10-19 DIAGNOSIS — R269 Unspecified abnormalities of gait and mobility: Secondary | ICD-10-CM | POA: Diagnosis not present

## 2017-10-19 DIAGNOSIS — G8918 Other acute postprocedural pain: Secondary | ICD-10-CM | POA: Diagnosis not present

## 2017-10-19 DIAGNOSIS — M109 Gout, unspecified: Secondary | ICD-10-CM | POA: Insufficient documentation

## 2017-10-19 DIAGNOSIS — K5900 Constipation, unspecified: Secondary | ICD-10-CM | POA: Diagnosis not present

## 2017-10-19 NOTE — Progress Notes (Addendum)
Subjective:    Patient ID: Jose [name], male    DOB: 04-30-1951, 66 y.o.   MRN: [id number]  HPI 66 y.o. male with history of SDH, OA bilateral knees, history of falls, partial quad rupture on left presents for follow up for b/l quad tendon ruptures.   Last clinic visit 07/19/13.  Since last visit, he is continuing therapies.  He saw ortho recently and therapies were increased.  Pain is controlled. Bowel movements are controlled.  Sleep is fair.  Using cane for ambulation.  Denies falls.   Pain Inventory Average Pain 5 Pain Right Now 3 My pain is sharp and dull  In the last 24 hours, has pain interfered with the following? General activity 2 Relation with others 0 Enjoyment of life 4 What TIME of day is your pain at its worst? night Sleep (in general) Fair  Pain is worse with: inactivity Pain improves with: heat/ice, therapy/exercise, pacing activities and medication Relief from Meds: 9  Mobility use a walker ability to climb steps?  yes do you drive?  no  Function employed # of hrs/week . I need assistance with the following:  bathing  Neuro/Psych numbness spasms  Prior Studies Any changes since last visit?  no  Physicians involved in your care Any changes since last visit?  no   Family History  Problem Relation Age of Onset  . Parkinson's disease Mother   . Heart disease Father   . Parkinson's disease Brother   . Diabetes Brother    Social History   Social History  . Marital status: Married    Spouse name: N/A  . Number of children: N/A  . Years of education: N/A   Social History Main Topics  . Smoking status: Current Every Day Smoker    Types: Cigars  . Smokeless tobacco: Never Used     Comment: celebration use   . Alcohol use 1.8 oz/week    2 Glasses of wine, 1 Shots of liquor per week     Comment: 2 drinks per day  . Drug use: No  . Sexual activity: Not Asked   Other Topics Concern  . None   Social History Narrative  . None   Past  Surgical History:  Procedure Laterality Date  . BRAIN SURGERY  2016   evacuation of SDH  . CARPAL TUNNEL RELEASE Bilateral   . GREEN LIGHT LASER TURP (TRANSURETHRAL RESECTION OF PROSTATE  04/27/2017  . QUADRICEPS TENDON REPAIR Bilateral 06/15/2017   Procedure: REPAIR QUADRICEP TENDON;  Surgeon: Meredith Pel, MD;  Location: [facility];  Service: Orthopedics;  Laterality: Bilateral;  . SHOULDER SURGERY Right    Past Medical History:  Diagnosis Date  . Arthritis    R shoulder, great toes, hands- has gout & has injections in knees with Dr. Estanislado Pandy   . Cancer ([place])    skin ca-   . GERD (gastroesophageal reflux disease)   . History of hiatal hernia   . SDH (subdural hematoma) (HCC)    BP 112/80   Pulse 69   SpO2 98%   Opioid Risk Score:   Fall Risk Score:  `1  Depression screen PHQ 2/9  Depression screen PHQ 2/9 07/19/2017  Decreased Interest 0  Down, Depressed, Hopeless 0  PHQ - 2 Score 0  Altered sleeping 2  Tired, decreased energy 0  Change in appetite 0  Feeling bad or failure about yourself  0  Trouble concentrating 0  Moving slowly or fidgety/restless 0  Suicidal thoughts  0  PHQ-9 Score 2  Difficult doing work/chores Not difficult at all     Review of Systems  Constitutional: Negative.   HENT: Negative.   Eyes: Negative.   Respiratory: Negative.   Cardiovascular: Negative.   Gastrointestinal: Negative.   Endocrine: Negative.   Genitourinary: Negative.   Musculoskeletal: Positive for arthralgias, gait problem, joint swelling and myalgias.  Skin: Negative.   Allergic/Immunologic: Negative.   Hematological: Negative.   Psychiatric/Behavioral: Negative.   All other systems reviewed and are negative.      Objective:   Physical Exam Constitutional: He appears well-developed and well-nourished. NAD. HENT: Normocephalic and atraumatic.  Eyes: EOM are normal. No discharge.  Cardiovascular: RRR. No JVD . Respiratory: Effort normal and breath sounds normal.    GI: Soft. Bowel sounds are normal.   Musculoskeletal: He exhibits edema left knee.  Gait: Mildly antalgic Neurological: He is alert and oriented  Motor: 5/5 b/l UE, b/l ADF/PF B/l: HF, KE 4+/5, 5/5 ADF/PF Skin: Bilateral knees incisions healed. Some discoloration of left knee.  Psychiatric: He has a normal mood and affect. His behavior is normal. Judgment and thought content normal.     Assessment & Plan:  66 y.o. male with history of SDH, OA bilateral knees, history of falls, partial quad rupture on left presents for follow up for b/l quad tendon ruptures.   1. Functional and mobility deficits secondary to bilateral quad tendon ruptures on 6/29  Cont therapies, ortho prescribing  Cont follow up with Ortho  2. Pain: mainly spasms  Cont Robaxin PRN, ortho now prescribing  Cont ice PRN  3. Constipation:   Regular with meds  4. Sleep disturbance  Improved and continues to improve  5. Gait abnormality  Cont cane at all times.

## 2017-10-22 ENCOUNTER — Encounter: Payer: Self-pay | Admitting: Physical Therapy

## 2017-10-22 ENCOUNTER — Ambulatory Visit: Payer: Medicare Other | Admitting: Physical Therapy

## 2017-10-22 DIAGNOSIS — S76111S Strain of right quadriceps muscle, fascia and tendon, sequela: Secondary | ICD-10-CM | POA: Diagnosis not present

## 2017-10-22 DIAGNOSIS — R2689 Other abnormalities of gait and mobility: Secondary | ICD-10-CM

## 2017-10-22 DIAGNOSIS — M25561 Pain in right knee: Secondary | ICD-10-CM

## 2017-10-22 DIAGNOSIS — M25562 Pain in left knee: Secondary | ICD-10-CM | POA: Diagnosis not present

## 2017-10-22 NOTE — Therapy (Signed)
[place], [place], [address] Phone: [phone]   Fax:  [phone]  Physical Therapy Treatment/ Recert   Patient Details  Name: Jose [name] MRN: [id number] Date of Birth: [birth year] Referring Provider: Dr Meredith Pel    Encounter Date: 10/22/2017  PT End of Session - 10/22/17 0851    Visit Number  16    Number of Visits  24    Date for PT Re-Evaluation  11/19/17    Authorization Type  [employer] blue shield     PT Start Time  0847    PT Stop Time  0930    PT Time Calculation (min)  43 min    Activity Tolerance  Patient tolerated treatment well    Behavior During Therapy  [facility] for tasks assessed/performed       Past Medical History:  Diagnosis Date  . Arthritis    R shoulder, great toes, hands- has gout & has injections in knees with Dr. Estanislado Pandy   . Cancer ([place])    skin ca-   . GERD (gastroesophageal reflux disease)   . History of hiatal hernia   . SDH (subdural hematoma) (HCC)     Past Surgical History:  Procedure Laterality Date  . BRAIN SURGERY  2016   evacuation of SDH  . CARPAL TUNNEL RELEASE Bilateral   . GREEN LIGHT LASER TURP (TRANSURETHRAL RESECTION OF PROSTATE  04/27/2017  . SHOULDER SURGERY Right     There were no vitals filed for this visit.  Subjective Assessment - 10/22/17 0850    Subjective  Patient reports no pain on the left side just weakness. He hads been rising the bije and improving his time.     Limitations  Lifting    How long can you stand comfortably?  < 20 minutes     Diagnostic tests  nothing post op     Currently in Pain?  No/denies    Multiple Pain Sites  No         [facility] PT Assessment - 10/22/17 0001      Functional Tests   Functional tests  Squat;Single leg stance;Step up      Squat   Comments  Decreased control on the left       Step Up   Comments  Decreased control with the left       Single Leg Stance   Comments  decresed  stability on the left       Strength   Right Hip Flexion  4+/5    Left Hip Flexion  4+/5    Right Knee Flexion  5/5    Right Knee Extension  5/5    Left Knee Flexion  4+/5    Left Knee Extension  4/5                  [facility] Adult PT Treatment/Exercise - 10/22/17 0001      Knee/Hip Exercises: Aerobic   Nustep  8 minutes L3,  decrease to 3 to decrease sting eft      Knee/Hip Exercises: Machines for Strengthening   Cybex Knee Flexion  55 Lbs both 10 x 3 sets      Knee/Hip Exercises: Standing   Hip Flexion Limitations  standing march 2x10 2lb weight     Forward Step Up Limitations  2x10 6 inch     SLS  single legf stance 4x15 second hold bilateral     Walking with Sports Cord  5 x forward 17 LBS.  cued for keeping knee slightly flexed.    Revers up to 23 LBS  with cues for terminal knee control      Knee/Hip Exercises: Supine   Quad Sets Limitations  x10     Bridges Limitations  2x10 with blue band     Straight Leg Raises Limitations  2x10              PT Education - 10/22/17 0851    Education provided  No       PT Short Term Goals - 10/22/17 0854      PT SHORT TERM GOAL #1   Title  Patient will increase bilateral lower extremity strength to 5/5     Baseline  progressing,     Time  4    Period  Weeks    Status  On-going      PT SHORT TERM GOAL #2   Title  Patient will be independent with inital HEP     Baseline  independent    Time  4    Period  Weeks    Status  Achieved      PT SHORT TERM GOAL #3   Title  Patient will demsotrate 20 second single leg stance bilateral     Baseline  15 sec needs occ UE A    Time  4    Period  Weeks    Status  Achieved        PT Long Term Goals - 09/26/17 2244      PT LONG TERM GOAL #1   Title  Patient will walk 1 moile without self report of pain     Baseline  min difficulty    Status  Partially Met      PT LONG TERM GOAL #2   Title  Patient will demsotrate a 30% limitation on FOTO     Baseline  46%     Status  On-going      PT LONG TERM GOAL #3   Title  Patient will sleep through the night without increased pain     Baseline  did so last night, recently added Baclofen for night time muscle    Status  Partially Met      PT LONG TERM GOAL #4   Title  Patient will stand for 1 hour without increased pain in order to perfrom daily tasks    Baseline  48 min with min pain     Status  Partially Met            Plan - 10/22/17 0853    Clinical Impression Statement  No pain with treatment today. Patient otolerated treatment wll. He continues to have an extensor lag. His right leg is doing very well. His left leg is progressing slowly. He would beneift from skilled therapy 3x a week for 4 more weeks he will likley progress to HEPat that time. His FOTO score has improved.     Clinical Presentation  Stable    Clinical Decision Making  Low    Rehab Potential  Good    PT Frequency  2x / week    PT Duration  8 weeks    PT Treatment/Interventions  ADLs/Self Care Home Management;Cryotherapy;Electrical Stimulation;Iontophoresis 56m/ml Dexamethasone;Stair training;Gait training;Moist Heat;Therapeutic activities;Therapeutic exercise;Neuromuscular re-education;Patient/family education;Passive range of motion;Manual techniques;Taping;Ultrasound    PT Next Visit Plan    See what MD said.  Exercise progression as tolerated. continue with steps; SLS, knee control ,  ;  progress weights and bands as tolerated. Terminal knee extension.  Repeat [place] ,  Quad strength as tolerated.    PT Home Exercise Plan  SLR; bridge, LAQ yellow; hamstring yellow; standing march     Consulted and Agree with Plan of Care  Patient       Patient will benefit from skilled therapeutic intervention in order to improve the following deficits and impairments:  Abnormal gait, Decreased range of motion, Pain, Decreased strength, Decreased activity tolerance, Impaired UE functional use, Decreased knowledge of use of DME  Visit  Diagnosis: Acute pain of right knee - Plan: PT plan of care cert/re-cert  Acute pain of left knee - Plan: PT plan of care cert/re-cert  Other abnormalities of gait and mobility - Plan: PT plan of care cert/re-cert     Problem List Patient Active Problem List   Diagnosis Date Noted  . Candidiasis   . Abnormality of gait   . Hypoalbuminemia due to protein-calorie malnutrition ([place])   . History of gout   . Sleep disturbance   . Constipation   . Rupture of quadriceps tendon, right, sequela 06/19/2017  . Quadriceps tendon rupture, left, sequela 06/19/2017  . Acute blood loss anemia 06/19/2017  . Fall   . History of subdural hematoma   . Post-operative pain   . Recurrent falls   . Quadriceps tendon rupture 06/15/2017  . Primary osteoarthritis of both knees 02/28/2017  . Primary osteoarthritis of both hands 02/28/2017  . Uricacidemia 02/28/2017  . Pain in joint of left knee 02/07/2017  . Idiopathic chronic gout, unspecified site, without tophus (tophi) 11/29/2016  . HEMATURIA UNSPECIFIED 01/25/2009  . ABDOMINAL PAIN 01/25/2009  . XERODERMA 03/10/2008  . UNS ADVRS EFF UNS RX MEDICINAL&BIOLOGICAL SBSTNC 03/10/2008  . ADJUSTMENT REACTION WITH PHYSICAL SYMPTOMS 02/14/2008  . MUSCLE SPASM 02/14/2008  . ACUTE PROSTATITIS 12/16/2007  . BREAST LUMP OR MASS, RIGHT 07/12/2007  . H/O: RCT (rotator cuff tear) 01/13/2007  . GOUT 01/08/2007    Carney Living PT DPT  10/22/2017, 12:01 PM  [facility] [address] [place], [place], [address] Phone: [phone]   Fax:  [phone]  Name: Jose [name] MRN: [id number] Date of Birth: 10-Aug-1951

## 2017-10-24 ENCOUNTER — Ambulatory Visit: Payer: Medicare Other | Admitting: Physical Therapy

## 2017-10-24 ENCOUNTER — Encounter: Payer: Self-pay | Admitting: Physical Therapy

## 2017-10-24 DIAGNOSIS — M25562 Pain in left knee: Secondary | ICD-10-CM

## 2017-10-24 DIAGNOSIS — S76111S Strain of right quadriceps muscle, fascia and tendon, sequela: Secondary | ICD-10-CM

## 2017-10-24 DIAGNOSIS — R2689 Other abnormalities of gait and mobility: Secondary | ICD-10-CM | POA: Diagnosis not present

## 2017-10-24 DIAGNOSIS — M25561 Pain in right knee: Secondary | ICD-10-CM

## 2017-10-24 NOTE — Therapy (Signed)
[place], [place], [address] Phone: [phone]   Fax:  [phone]  Physical Therapy Treatment  Patient Details  Name: Jose [name] MRN: [id number] Date of Birth: July 10, 1951 Referring Provider: Dr Meredith Pel    Encounter Date: 10/24/2017  PT End of Session - 10/24/17 0850    Visit Number  17    Number of Visits  24    Date for PT Re-Evaluation  11/19/17    Authorization Type  [employer] cross blue shield     PT Start Time  0845    PT Stop Time  0939    PT Time Calculation (min)  54 min    Activity Tolerance  Patient tolerated treatment well    Behavior During Therapy  [facility] for tasks assessed/performed       Past Medical History:  Diagnosis Date  . Arthritis    R shoulder, great toes, hands- has gout & has injections in knees with Dr. Estanislado Pandy   . Cancer ([place])    skin ca-   . GERD (gastroesophageal reflux disease)   . History of hiatal hernia   . SDH (subdural hematoma) (HCC)     Past Surgical History:  Procedure Laterality Date  . BRAIN SURGERY  2016   evacuation of SDH  . CARPAL TUNNEL RELEASE Bilateral   . GREEN LIGHT LASER TURP (TRANSURETHRAL RESECTION OF PROSTATE  04/27/2017  . SHOULDER SURGERY Right     There were no vitals filed for this visit.  Subjective Assessment - 10/24/17 0848    Subjective  Patient had no soreness after the last visit. He still feels like something is off on the left lower extremity, He is having no pain this morning,     Limitations  Lifting    How long can you stand comfortably?  < 20 minutes     Currently in Pain?  No/denies                      [facility] Adult PT Treatment/Exercise - 10/24/17 0001      Knee/Hip Exercises: Aerobic   Nustep  8 minutes L3,  decrease to 3 to decrease sting eft      Knee/Hip Exercises: Machines for Strengthening   Cybex Knee Flexion  55 Lbs both 10 x 3 sets      Knee/Hip Exercises: Standing   Hip  Flexion Limitations  standing march 2x10 2lb weight     Forward Step Up Limitations  2x10 6 inch     SLS  single leg stance 4x15 second hold bilateral eyes closed with CGA     Other Standing Knee Exercises  step onto air-ex 2x10     Other Standing Knee Exercises  rocker board controlled side to side 2x10       Knee/Hip Exercises: Supine   Bridges Limitations  2x10 with blue band     Straight Leg Raises Limitations  2x10       Cryotherapy   Number Minutes Cryotherapy  10 Minutes    Cryotherapy Location  Knee             PT Education - 10/24/17 0849    Education provided  Yes    Education Details  reviewed higher level ther-ex     Person(s) Educated  Patient    Methods  Explanation    Comprehension  Verbalized understanding;Returned demonstration;Verbal cues required;Tactile cues required       PT Short Term  Goals - 10/22/17 0854      PT SHORT TERM GOAL #1   Title  Patient will increase bilateral lower extremity strength to 5/5     Baseline  progressing,     Time  4    Period  Weeks    Status  On-going      PT SHORT TERM GOAL #2   Title  Patient will be independent with inital HEP     Baseline  independent    Time  4    Period  Weeks    Status  Achieved      PT SHORT TERM GOAL #3   Title  Patient will demsotrate 20 second single leg stance bilateral     Baseline  15 sec needs occ UE A    Time  4    Period  Weeks    Status  Achieved        PT Long Term Goals - 09/26/17 9935      PT LONG TERM GOAL #1   Title  Patient will walk 1 moile without self report of pain     Baseline  min difficulty    Status  Partially Met      PT LONG TERM GOAL #2   Title  Patient will demsotrate a 30% limitation on FOTO     Baseline  46%    Status  On-going      PT LONG TERM GOAL #3   Title  Patient will sleep through the night without increased pain     Baseline  did so last night, recently added Baclofen for night time muscle    Status  Partially Met      PT LONG TERM  GOAL #4   Title  Patient will stand for 1 hour without increased pain in order to perfrom daily tasks    Baseline  48 min with min pain     Status  Partially Met            Plan - 10/24/17 0916    Clinical Impression Statement  Patient contnues to tolerate treatment well. he appeared to have more control with air-ex ther-ex. Therapy added higher level stability exercises.     Clinical Presentation  Stable    Clinical Decision Making  Low    Rehab Potential  Good    PT Frequency  2x / week    PT Duration  8 weeks    PT Treatment/Interventions  ADLs/Self Care Home Management;Cryotherapy;Electrical Stimulation;Iontophoresis 38m/ml Dexamethasone;Stair training;Gait training;Moist Heat;Therapeutic activities;Therapeutic exercise;Neuromuscular re-education;Patient/family education;Passive range of motion;Manual techniques;Taping;Ultrasound    PT Next Visit Plan  continue to focus on neuro-musuclar re-ex and stabilization exerises     PT Home Exercise Plan  SLR; bridge, LAQ yellow; hamstring yellow; standing march     Consulted and Agree with Plan of Care  Patient       Patient will benefit from skilled therapeutic intervention in order to improve the following deficits and impairments:  Abnormal gait, Decreased range of motion, Pain, Decreased strength, Decreased activity tolerance, Impaired UE functional use, Decreased knowledge of use of DME  Visit Diagnosis: Acute pain of right knee  Acute pain of left knee  Other abnormalities of gait and mobility  Rupture of quadriceps tendon, right, sequela     Problem List Patient Active Problem List   Diagnosis Date Noted  . Candidiasis   . Abnormality of gait   . Hypoalbuminemia due to protein-calorie malnutrition ([place]   . History of  gout   . Sleep disturbance   . Constipation   . Rupture of quadriceps tendon, right, sequela 06/19/2017  . Quadriceps tendon rupture, left, sequela 06/19/2017  . Acute blood loss anemia 06/19/2017   . Fall   . History of subdural hematoma   . Post-operative pain   . Recurrent falls   . Quadriceps tendon rupture 06/15/2017  . Primary osteoarthritis of both knees 02/28/2017  . Primary osteoarthritis of both hands 02/28/2017  . Uricacidemia 02/28/2017  . Pain in joint of left knee 02/07/2017  . Idiopathic chronic gout, unspecified site, without tophus (tophi) 11/29/2016  . HEMATURIA UNSPECIFIED 01/25/2009  . ABDOMINAL PAIN 01/25/2009  . XERODERMA 03/10/2008  . UNS ADVRS EFF UNS RX MEDICINAL&BIOLOGICAL SBSTNC 03/10/2008  . ADJUSTMENT REACTION WITH PHYSICAL SYMPTOMS 02/14/2008  . MUSCLE SPASM 02/14/2008  . ACUTE PROSTATITIS 12/16/2007  . BREAST LUMP OR MASS, RIGHT 07/12/2007  . H/O: RCT (rotator cuff tear) 01/13/2007  . GOUT 01/08/2007    Carney Living PT DPT  10/24/2017, 3:26 PM  [facility] [address] [place], [place], [address] Phone: [phone]   Fax:  [phone]  Name: Jose [name] MRN: [id number] Date of Birth: 06-Nov-1951

## 2017-10-26 ENCOUNTER — Ambulatory Visit: Payer: Medicare Other | Admitting: Physical Therapy

## 2017-10-26 ENCOUNTER — Encounter: Payer: Self-pay | Admitting: Physical Therapy

## 2017-10-26 DIAGNOSIS — R2689 Other abnormalities of gait and mobility: Secondary | ICD-10-CM | POA: Diagnosis not present

## 2017-10-26 DIAGNOSIS — M25562 Pain in left knee: Secondary | ICD-10-CM | POA: Diagnosis not present

## 2017-10-26 DIAGNOSIS — M25561 Pain in right knee: Secondary | ICD-10-CM

## 2017-10-26 DIAGNOSIS — S76111S Strain of right quadriceps muscle, fascia and tendon, sequela: Secondary | ICD-10-CM | POA: Diagnosis not present

## 2017-10-26 NOTE — Therapy (Signed)
[place], [place], [address] Phone: [phone]   Fax:  [phone]  Physical Therapy Treatment  Patient Details  Name: Jose [name] MRN: [id number] Date of Birth: 1951-11-25 Referring Provider: Dr Meredith Pel    Encounter Date: 10/26/2017  PT End of Session - 10/26/17 1111    Visit Number  18    Number of Visits  24    Date for PT Re-Evaluation  11/19/17    Authorization Type  [employer] cross blue shield     PT Start Time  1101    PT Stop Time  1153    PT Time Calculation (min)  52 min    Activity Tolerance  Patient tolerated treatment well    Behavior During Therapy  [facility] for tasks assessed/performed       Past Medical History:  Diagnosis Date  . Arthritis    R shoulder, great toes, hands- has gout & has injections in knees with Dr. Estanislado Pandy   . Cancer ([place])    skin ca-   . GERD (gastroesophageal reflux disease)   . History of hiatal hernia   . SDH (subdural hematoma) (HCC)     Past Surgical History:  Procedure Laterality Date  . BRAIN SURGERY  2016   evacuation of SDH  . CARPAL TUNNEL RELEASE Bilateral   . GREEN LIGHT LASER TURP (TRANSURETHRAL RESECTION OF PROSTATE  04/27/2017  . SHOULDER SURGERY Right     There were no vitals filed for this visit.  Subjective Assessment - 10/26/17 1102    Subjective  Patient has no complaints today. He feels like the divot in his quad has not been as bad. He has not had any pain. He has been working on his exercises. He is still riding the bike.     Limitations  Lifting    How long can you stand comfortably?  < 20 minutes     Diagnostic tests  nothing post op     Currently in Pain?  No/denies                      [facility] Adult PT Treatment/Exercise - 10/26/17 0001      Knee/Hip Exercises: Aerobic   Nustep  8 minutes L3,  decrease to 3 to decrease sting eft      Knee/Hip Exercises: Machines for Strengthening   Cybex Knee  Flexion  55 Lbs both 10 x 3 sets      Knee/Hip Exercises: Standing   Hip Flexion Limitations  standing march 2x10 2lb weight     Abduction Limitations  red band x10 each leg     Extension Limitations  red band x10 each leg    Forward Step Up Limitations  2x10 6 inch     SLS  single leg stance 4x15 second hold bilateral eyes closed with CGA     Other Standing Knee Exercises  step onto air-ex 2x10; cable walk with close guard 3 plates x5 forward/ 5 times back;     Other Standing Knee Exercises  rocker board controlled side to side 2x10; lateral band walk 5 laps at short counter       Knee/Hip Exercises: Supine   Bridges Limitations  2x10 with blue band     Straight Leg Raises Limitations  2x10       Cryotherapy   Number Minutes Cryotherapy  10 Minutes    Cryotherapy Location  Knee    Type of Cryotherapy  Ice pack             PT Education - 10/26/17 1105    Education provided  Yes    Education Details  reviewed stability exercises     Person(s) Educated  Patient    Methods  Explanation    Comprehension  Verbalized understanding;Returned demonstration;Verbal cues required;Tactile cues required       PT Short Term Goals - 10/22/17 0854      PT SHORT TERM GOAL #1   Title  Patient will increase bilateral lower extremity strength to 5/5     Baseline  progressing,     Time  4    Period  Weeks    Status  On-going      PT SHORT TERM GOAL #2   Title  Patient will be independent with inital HEP     Baseline  independent    Time  4    Period  Weeks    Status  Achieved      PT SHORT TERM GOAL #3   Title  Patient will demsotrate 20 second single leg stance bilateral     Baseline  15 sec needs occ UE A    Time  4    Period  Weeks    Status  Achieved        PT Long Term Goals - 09/26/17 1751      PT LONG TERM GOAL #1   Title  Patient will walk 1 moile without self report of pain     Baseline  min difficulty    Status  Partially Met      PT LONG TERM GOAL #2    Title  Patient will demsotrate a 30% limitation on FOTO     Baseline  46%    Status  On-going      PT LONG TERM GOAL #3   Title  Patient will sleep through the night without increased pain     Baseline  did so last night, recently added Baclofen for night time muscle    Status  Partially Met      PT LONG TERM GOAL #4   Title  Patient will stand for 1 hour without increased pain in order to perfrom daily tasks    Baseline  48 min with min pain     Status  Partially Met            Plan - 10/26/17 1128    Clinical Impression Statement  Patient is making good progress. He is having no pain but continues to have some buckling at toimes. He was fatigued with advanced ther-ex. He was advised to continue his exercises over the weekend.     Clinical Presentation  Stable    Clinical Decision Making  Low       Patient will benefit from skilled therapeutic intervention in order to improve the following deficits and impairments:     Visit Diagnosis: Acute pain of right knee  Acute pain of left knee  Other abnormalities of gait and mobility     Problem List Patient Active Problem List   Diagnosis Date Noted  . Candidiasis   . Abnormality of gait   . Hypoalbuminemia due to protein-calorie malnutrition ([place])   . History of gout   . Sleep disturbance   . Constipation   . Rupture of quadriceps tendon, right, sequela 06/19/2017  . Quadriceps tendon rupture, left, sequela 06/19/2017  . Acute blood loss anemia 06/19/2017  . Fall   .  History of subdural hematoma   . Post-operative pain   . Recurrent falls   . Quadriceps tendon rupture 06/15/2017  . Primary osteoarthritis of both knees 02/28/2017  . Primary osteoarthritis of both hands 02/28/2017  . Uricacidemia 02/28/2017  . Pain in joint of left knee 02/07/2017  . Idiopathic chronic gout, unspecified site, without tophus (tophi) 11/29/2016  . HEMATURIA UNSPECIFIED 01/25/2009  . ABDOMINAL PAIN 01/25/2009  . XERODERMA  03/10/2008  . UNS ADVRS EFF UNS RX MEDICINAL&BIOLOGICAL SBSTNC 03/10/2008  . ADJUSTMENT REACTION WITH PHYSICAL SYMPTOMS 02/14/2008  . MUSCLE SPASM 02/14/2008  . ACUTE PROSTATITIS 12/16/2007  . BREAST LUMP OR MASS, RIGHT 07/12/2007  . H/O: RCT (rotator cuff tear) 01/13/2007  . GOUT 01/08/2007    Carney Living PT DPT  10/26/2017, 1:40 PM  [place], [place], [address] Phone: [phone]   Fax:  [phone]  Name: Jose [name] MRN: [id number] Date of Birth: 1951-12-09

## 2017-10-29 ENCOUNTER — Ambulatory Visit: Payer: Medicare Other | Admitting: Physical Therapy

## 2017-10-29 ENCOUNTER — Telehealth (INDEPENDENT_AMBULATORY_CARE_PROVIDER_SITE_OTHER): Payer: Self-pay | Admitting: Radiology

## 2017-10-29 ENCOUNTER — Encounter: Payer: Self-pay | Admitting: Physical Therapy

## 2017-10-29 DIAGNOSIS — S76111S Strain of right quadriceps muscle, fascia and tendon, sequela: Secondary | ICD-10-CM

## 2017-10-29 DIAGNOSIS — R2689 Other abnormalities of gait and mobility: Secondary | ICD-10-CM | POA: Diagnosis not present

## 2017-10-29 DIAGNOSIS — M25561 Pain in right knee: Secondary | ICD-10-CM

## 2017-10-29 DIAGNOSIS — M25562 Pain in left knee: Secondary | ICD-10-CM | POA: Diagnosis not present

## 2017-10-29 DIAGNOSIS — F438 Other reactions to severe stress: Secondary | ICD-10-CM | POA: Diagnosis not present

## 2017-10-29 NOTE — Telephone Encounter (Signed)
Patient called stating his left knee brace is ripped and was wanting a new one. Will insurance cover another brace?  Call back # [phone]

## 2017-10-29 NOTE — Telephone Encounter (Signed)
I think since he has worn it for two months, it would be considered wear and tear, not a faulty brace.  I think since insurance covered it, and he was postop, that maybe they would cover it again, but we cannot know that for sure until we file it with them.

## 2017-10-29 NOTE — Telephone Encounter (Signed)
Can you please help with this? Patient is s/p bilateral quad tendon repair. He has been wearing hinged knee brace on left side only.......I called and spoke with patient he was asking if insurance would cover another hinged knee brace. We billed his insurance for it back in September. He stated that the material has worn away from the inside hinge bar and is sticking out and he is afraid that it is going to rip his clothing. After speaking with insurance dept, I called him back and let him know that I could not guarantee that his insurance would cover, they may say its too soon since he just received one in Sept. He asked if there was anything else that could be done due to the amount he would be charged if we billed him and insurance denied coverage and especially since he has only had product for 2 months. Please advise.

## 2017-10-29 NOTE — Therapy (Signed)
[place], [place], [address] Phone: [phone]   Fax:  [phone]  Physical Therapy Treatment  Patient Details  Name: Jose [name] MRN: [id number] Date of Birth: 1951-02-18 Referring Provider: Dr Meredith Pel    Encounter Date: 10/29/2017  PT End of Session - 10/29/17 1050    Visit Number  19    Number of Visits  24    Date for PT Re-Evaluation  11/19/17    PT Start Time  0848    PT Stop Time  0943    PT Time Calculation (min)  55 min    Activity Tolerance  Patient tolerated treatment well    Behavior During Therapy  [name] for tasks assessed/performed       Past Medical History:  Diagnosis Date  . Arthritis    R shoulder, great toes, hands- has gout & has injections in knees with Dr. Estanislado Pandy   . Cancer ([place])    skin ca-   . GERD (gastroesophageal reflux disease)   . History of hiatal hernia   . SDH (subdural hematoma) (HCC)     Past Surgical History:  Procedure Laterality Date  . BRAIN SURGERY  2016   evacuation of SDH  . CARPAL TUNNEL RELEASE Bilateral   . GREEN LIGHT LASER TURP (TRANSURETHRAL RESECTION OF PROSTATE  04/27/2017  . SHOULDER SURGERY Right     There were no vitals filed for this visit.  Subjective Assessment - 10/29/17 0852    Subjective  Patient has been walking some inside withoud brace with cane.  He is sore from standing to work out arms more at the gym.  he has been riding stationary bike with a longer range to work it differtntly.  i want to work on stretching and edema.  I feel I am walking stronger.      Currently in Pain?  No/denies    Pain Location  Knee    Pain Orientation  Left;Right    Pain Descriptors / Indicators  Sore                      [facility] Adult PT Treatment/Exercise - 10/29/17 0001      High Level Balance   High Level Balance Activities  -- multiple dynamic/ static, compliant/non compliant     High Level Balance Comments   SBA      Knee/Hip Exercises: Aerobic   Nustep  8 min L3      Knee/Hip Exercises: Machines for Strengthening   Total Gym Leg Press  60 LBS 20 X both and 20 X each single      Knee/Hip Exercises: Standing   Forward Step Up Limitations  2x10 6 inch  1 hand,  cued to engage hips    Functional Squat  10 reps    Functional Squat Limitations  to different levels,  cued le position.     Wall Squat  1 set;10 reps    SLS  eyes open on floor and airex,  challanging      Cryotherapy   Number Minutes Cryotherapy  10 Minutes    Cryotherapy Location  Knee    Type of Cryotherapy  -- cold pack      Manual Therapy   Manual therapy comments  retrograde and PROM extension left at patient's request.                PT Short Term Goals - 10/22/17 8416  PT SHORT TERM GOAL #1   Title  Patient will increase bilateral lower extremity strength to 5/5     Baseline  progressing,     Time  4    Period  Weeks    Status  On-going      PT SHORT TERM GOAL #2   Title  Patient will be independent with inital HEP     Baseline  independent    Time  4    Period  Weeks    Status  Achieved      PT SHORT TERM GOAL #3   Title  Patient will demsotrate 20 second single leg stance bilateral     Baseline  15 sec needs occ UE A    Time  4    Period  Weeks    Status  Achieved        PT Long Term Goals - 10/29/17 1054      PT LONG TERM GOAL #1   Title  Patient will walk 1 moile without self report of pain     Baseline  able    Time  8    Period  Weeks    Status  Achieved      PT LONG TERM GOAL #2   Title  Patient will demsotrate a 30% limitation on FOTO     Time  8    Period  Weeks    Status  Unable to assess      PT LONG TERM GOAL #3   Title  Patient will sleep through the night without increased pain     Baseline  pain at times    Time  8    Period  Weeks    Status  On-going      PT LONG TERM GOAL #4   Title  Patient will stand for 1 hour without increased pain in order to  perfrom daily tasks    Baseline  able    Time  8    Period  Weeks    Status  Achieved            Plan - 10/29/17 1051    Clinical Impression Statement  Quad strength improving with increased superior tracking of patella noted since I last saw.    LTG #1,  #4 met.  Felt exercised at end of session.  No pain increased.  distal quad soreness noted during session with functional squats,   brief.All session with brace off except for Nu step. at beginning of session.    PT Treatment/Interventions  ADLs/Self Care Home Management;Cryotherapy;Electrical Stimulation;Iontophoresis 33m/ml Dexamethasone;Stair training;Gait training;Moist Heat;Therapeutic activities;Therapeutic exercise;Neuromuscular re-education;Patient/family education;Passive range of motion;Manual techniques;Taping;Ultrasound    PT Next Visit Plan  continue to focus on neuro-musuclar re-ex and stabilization exerises     PT Home Exercise Plan  SLR; bridge, LAQ yellow; hamstring yellow; standing march     Consulted and Agree with Plan of Care  Patient       Patient will benefit from skilled therapeutic intervention in order to improve the following deficits and impairments:     Visit Diagnosis: Acute pain of right knee  Acute pain of left knee  Other abnormalities of gait and mobility  Rupture of quadriceps tendon, right, sequela     Problem List Patient Active Problem List   Diagnosis Date Noted  . Candidiasis   . Abnormality of gait   . Hypoalbuminemia due to protein-calorie malnutrition ([place]   . History of gout   . Sleep  disturbance   . Constipation   . Rupture of quadriceps tendon, right, sequela 06/19/2017  . Quadriceps tendon rupture, left, sequela 06/19/2017  . Acute blood loss anemia 06/19/2017  . Fall   . History of subdural hematoma   . Post-operative pain   . Recurrent falls   . Quadriceps tendon rupture 06/15/2017  . Primary osteoarthritis of both knees 02/28/2017  . Primary osteoarthritis of  both hands 02/28/2017  . Uricacidemia 02/28/2017  . Pain in joint of left knee 02/07/2017  . Idiopathic chronic gout, unspecified site, without tophus (tophi) 11/29/2016  . HEMATURIA UNSPECIFIED 01/25/2009  . ABDOMINAL PAIN 01/25/2009  . XERODERMA 03/10/2008  . UNS ADVRS EFF UNS RX MEDICINAL&BIOLOGICAL SBSTNC 03/10/2008  . ADJUSTMENT REACTION WITH PHYSICAL SYMPTOMS 02/14/2008  . MUSCLE SPASM 02/14/2008  . ACUTE PROSTATITIS 12/16/2007  . BREAST LUMP OR MASS, RIGHT 07/12/2007  . H/O: RCT (rotator cuff tear) 01/13/2007  . GOUT 01/08/2007    HARRIS,KAREN PTA 10/29/2017, 10:56 AM  [facility] [address] [place], [place], [address] Phone: [phone]   Fax:  [phone]  Name: Jose [name] MRN: [id number] Date of Birth: June 06, 1951

## 2017-10-29 NOTE — Telephone Encounter (Signed)
Tried calling to discuss with patient. No answer. Left detailed message advising of below.

## 2017-10-30 ENCOUNTER — Telehealth (INDEPENDENT_AMBULATORY_CARE_PROVIDER_SITE_OTHER): Payer: Self-pay | Admitting: Orthopedic Surgery

## 2017-10-30 ENCOUNTER — Ambulatory Visit (INDEPENDENT_AMBULATORY_CARE_PROVIDER_SITE_OTHER): Payer: Medicare Other

## 2017-10-30 NOTE — Telephone Encounter (Signed)
Patient called back returning your call and wanted to speak with you. CB # [phone]

## 2017-10-30 NOTE — Telephone Encounter (Signed)
Put up front for patient to come by and pick up.

## 2017-10-31 ENCOUNTER — Encounter: Payer: Self-pay | Admitting: Physical Therapy

## 2017-10-31 ENCOUNTER — Ambulatory Visit: Payer: Medicare Other | Admitting: Physical Therapy

## 2017-10-31 DIAGNOSIS — R2689 Other abnormalities of gait and mobility: Secondary | ICD-10-CM | POA: Diagnosis not present

## 2017-10-31 DIAGNOSIS — M25562 Pain in left knee: Secondary | ICD-10-CM

## 2017-10-31 DIAGNOSIS — D485 Neoplasm of uncertain behavior of skin: Secondary | ICD-10-CM | POA: Diagnosis not present

## 2017-10-31 DIAGNOSIS — L820 Inflamed seborrheic keratosis: Secondary | ICD-10-CM | POA: Diagnosis not present

## 2017-10-31 DIAGNOSIS — C44612 Basal cell carcinoma of skin of right upper limb, including shoulder: Secondary | ICD-10-CM | POA: Diagnosis not present

## 2017-10-31 DIAGNOSIS — S76111S Strain of right quadriceps muscle, fascia and tendon, sequela: Secondary | ICD-10-CM

## 2017-10-31 DIAGNOSIS — M25561 Pain in right knee: Secondary | ICD-10-CM | POA: Diagnosis not present

## 2017-10-31 DIAGNOSIS — Z08 Encounter for follow-up examination after completed treatment for malignant neoplasm: Secondary | ICD-10-CM | POA: Diagnosis not present

## 2017-10-31 DIAGNOSIS — Z85828 Personal history of other malignant neoplasm of skin: Secondary | ICD-10-CM | POA: Diagnosis not present

## 2017-10-31 DIAGNOSIS — L570 Actinic keratosis: Secondary | ICD-10-CM | POA: Diagnosis not present

## 2017-10-31 NOTE — Therapy (Signed)
[place], [place], [address] Phone: [phone]   Fax:  [phone]  Physical Therapy Treatment  Patient Details  Name: Jose [name] MRN: [id number] Date of Birth: 1951-10-14 Referring Provider: Dr Meredith Pel    Encounter Date: 10/31/2017  PT End of Session - 10/31/17 1049    Visit Number  20    Number of Visits  24    Date for PT Re-Evaluation  11/19/17    PT Start Time  0853    PT Stop Time  0942    PT Time Calculation (min)  49 min    Activity Tolerance  Patient tolerated treatment well    Behavior During Therapy  [facility] for tasks assessed/performed       Past Medical History:  Diagnosis Date  . Arthritis    R shoulder, great toes, hands- has gout & has injections in knees with Dr. Estanislado Pandy   . Cancer ([place])    skin ca-   . GERD (gastroesophageal reflux disease)   . History of hiatal hernia   . SDH (subdural hematoma) (HCC)     Past Surgical History:  Procedure Laterality Date  . BRAIN SURGERY  2016   evacuation of SDH  . CARPAL TUNNEL RELEASE Bilateral   . GREEN LIGHT LASER TURP (TRANSURETHRAL RESECTION OF PROSTATE  04/27/2017  . SHOULDER SURGERY Right     There were no vitals filed for this visit.      [facility] PT Assessment - 10/31/17 0001      Observation/Other Assessments-Edema    Edema  Circumferential      Circumferential Edema   Circumferential - Right  40.3 cm    Circumferential - Left   40.6 mid patella                  [facility] Adult PT Treatment/Exercise - 10/31/17 0001      Knee/Hip Exercises: Aerobic   Elliptical  3 min ramp, level 1.  limited by fatigue      Knee/Hip Exercises: Machines for Strengthening   Cybex Knee Extension  5  LBS  up 2 lower one 20 X patirny feels a 30 % difference right vs left.     Cybex Knee Flexion  both and single (     Total Gym Leg Press  60 LBS 20 X both and 20 X each single  letf 1       Knee/Hip Exercises:  Standing   Heel Raises  1 set singles both    Forward Step Up  Both;2 sets;10 reps;Hand Hold: 1;Hand Hold: 0;Step Height: 6";Limitations    Forward Step Up Limitations  cued for no hands,  then a balance challange    Step Down  Both;1 set;20 reps;Hand Hold: 1;Step Height: 4";Limitations    Step Down Limitations  needs hands     Functional Squat Limitations  small squat walking 25 feet estimated      Cryotherapy   Number Minutes Cryotherapy  10 Minutes    Cryotherapy Location  Knee both    Type of Cryotherapy  -- cold pack      Manual Therapy   Manual Therapy  -- patellar mobilization  med/lat/superior mobility  improved    Manual therapy comments  retrograde at patient's request               PT Short Term Goals - 10/31/17 1054      PT SHORT TERM GOAL #1   Title  Patient will increase bilateral lower extremity strength to 5/5     Baseline  progressing,     Time  4    Period  Weeks    Status  On-going      PT SHORT TERM GOAL #2   Title  Patient will be independent with inital HEP     Baseline  independent    Time  4    Period  Weeks    Status  Achieved      PT SHORT TERM GOAL #3   Title  Patient will demsotrate 20 second single leg stance bilateral     Time  4    Period  Weeks    Status  Achieved        PT Long Term Goals - 10/31/17 1054      PT LONG TERM GOAL #1   Title  Patient will walk 1 mile without self report of pain     Baseline  able    Time  8    Period  Weeks    Status  Achieved      PT LONG TERM GOAL #2   Title  Patient will demsotrate a 30% limitation on FOTO     Time  8    Period  Weeks    Status  On-going      PT LONG TERM GOAL #3   Title  Patient will sleep through the night without increased pain     Time  8    Period  Weeks    Status  Unable to assess      PT LONG TERM GOAL #4   Title  Patient will stand for 1 hour without increased pain in order to perfrom daily tasks    Baseline  able    Time  8    Period  Weeks     Status  Achieved            Plan - 10/31/17 1049    Clinical Impression Statement  Patient continues to be dedicated to his HEP. His POC is 11/19/2017 and he realizes strengthening is going to be a lifelong thing.  No pain increased at the end of session,  Nothing other than good exercise discomfort noted.  Patient Tolerated elliptical 3 minutes prior to fatugue limiting.  ( ramp 1)      PT Next Visit Plan  continue to focus on neuro-musuclar re-ex and stabilization exerises .  Needs FOTO.    PT Home Exercise Plan  SLR; bridge, LAQ yellow; hamstring yellow; standing march     Consulted and Agree with Plan of Care  Patient       Patient will benefit from skilled therapeutic intervention in order to improve the following deficits and impairments:     Visit Diagnosis: Acute pain of right knee  Acute pain of left knee  Other abnormalities of gait and mobility  Rupture of quadriceps tendon, right, sequela     Problem List Patient Active Problem List   Diagnosis Date Noted  . Candidiasis   . Abnormality of gait   . Hypoalbuminemia due to protein-calorie malnutrition ([place])   . History of gout   . Sleep disturbance   . Constipation   . Rupture of quadriceps tendon, right, sequela 06/19/2017  . Quadriceps tendon rupture, left, sequela 06/19/2017  . Acute blood loss anemia 06/19/2017  . Fall   . History of subdural hematoma   . Post-operative pain   . Recurrent falls   .  Quadriceps tendon rupture 06/15/2017  . Primary osteoarthritis of both knees 02/28/2017  . Primary osteoarthritis of both hands 02/28/2017  . Uricacidemia 02/28/2017  . Pain in joint of left knee 02/07/2017  . Idiopathic chronic gout, unspecified site, without tophus (tophi) 11/29/2016  . HEMATURIA UNSPECIFIED 01/25/2009  . ABDOMINAL PAIN 01/25/2009  . XERODERMA 03/10/2008  . UNS ADVRS EFF UNS RX MEDICINAL&BIOLOGICAL SBSTNC 03/10/2008  . ADJUSTMENT REACTION WITH PHYSICAL SYMPTOMS 02/14/2008  . MUSCLE  SPASM 02/14/2008  . ACUTE PROSTATITIS 12/16/2007  . BREAST LUMP OR MASS, RIGHT 07/12/2007  . H/O: RCT (rotator cuff tear) 01/13/2007  . GOUT 01/08/2007    HARRIS,KAREN PTA 10/31/2017, 10:56 AM  [facility] [address] [place], [place], [address] Phone: [phone]   Fax:  [phone]  Name: Jose [name] MRN: [id number] Date of Birth: 16-May-1951

## 2017-11-02 ENCOUNTER — Ambulatory Visit: Payer: Medicare Other | Admitting: Physical Therapy

## 2017-11-02 ENCOUNTER — Encounter: Payer: Self-pay | Admitting: Physical Therapy

## 2017-11-02 DIAGNOSIS — M25562 Pain in left knee: Secondary | ICD-10-CM | POA: Diagnosis not present

## 2017-11-02 DIAGNOSIS — S76111S Strain of right quadriceps muscle, fascia and tendon, sequela: Secondary | ICD-10-CM | POA: Diagnosis not present

## 2017-11-02 DIAGNOSIS — M25561 Pain in right knee: Secondary | ICD-10-CM

## 2017-11-02 DIAGNOSIS — R2689 Other abnormalities of gait and mobility: Secondary | ICD-10-CM | POA: Diagnosis not present

## 2017-11-02 NOTE — Therapy (Signed)
[place], [place], [address] Phone: [phone]   Fax:  [phone]  Physical Therapy Treatment  Patient Details  Name: Jose [name] MRN: [id number] Date of Birth: October 19, 1951 Referring Provider: Dr Meredith Pel    Encounter Date: 11/02/2017  PT End of Session - 11/02/17 0941    Visit Number  21    Number of Visits  24    Date for PT Re-Evaluation  11/19/17    Authorization Type  [employer]     PT Start Time  0850    PT Stop Time  0940    PT Time Calculation (min)  50 min       Past Medical History:  Diagnosis Date  . Arthritis    R shoulder, great toes, hands- has gout & has injections in knees with Dr. Estanislado Pandy   . Cancer ([place])    skin ca-   . GERD (gastroesophageal reflux disease)   . History of hiatal hernia   . SDH (subdural hematoma) (HCC)     Past Surgical History:  Procedure Laterality Date  . BRAIN SURGERY  2016   evacuation of SDH  . CARPAL TUNNEL RELEASE Bilateral   . GREEN LIGHT LASER TURP (TRANSURETHRAL RESECTION OF PROSTATE  04/27/2017  . REPAIR QUADRICEP TENDON Bilateral 06/15/2017   Performed by Meredith Pel, MD at [facility]     There were no vitals filed for this visit.  Subjective Assessment - 11/02/17 0858    Subjective   No pain today. Had some swelling yesterday and numbness left knee and below. Numbness has resolved.     Currently in Pain?  No/denies                      [facility] Adult PT Treatment/Exercise - 11/02/17 0001      Knee/Hip Exercises: Aerobic   Elliptical  4 min Level 3 ramp and incline       Knee/Hip Exercises: Machines for Strengthening   Cybex Knee Extension  5  LBS  up 2 lower one 20 X patirny feels a 30 % difference right vs left.     Cybex Knee Flexion  both and single , bilateral with 20#     Total Gym Leg Press  60 LBS 20 X both and 10 X 2 each single   40#      Knee/Hip  Exercises: Standing   Forward Step Up  Both;2 sets;10 reps;Hand Hold: 1;Hand Hold: 0;Step Height: 6";Limitations    Forward Step Up Limitations  cued for no hands,  then a balance challange    SLS  25 sec right, 10 sec left , tandem stance x 60 sec left leg back     Other Standing Knee Exercises  step onto air-ex 2x10 forawrd and side step 2 x 10  .tandem gait       Cryotherapy   Number Minutes Cryotherapy  10 Minutes    Cryotherapy Location  Knee    Type of Cryotherapy  Ice pack               PT Short Term Goals - 10/31/17 1054      PT SHORT TERM GOAL #1   Title  Patient will increase bilateral lower extremity strength to 5/5     Baseline  progressing,     Time  4    Period  Weeks    Status  On-going      PT SHORT TERM GOAL #2   Title  Patient will be independent with inital HEP     Baseline  independent    Time  4    Period  Weeks    Status  Achieved      PT SHORT TERM GOAL #3   Title  Patient will demsotrate 20 second single leg stance bilateral     Time  4    Period  Weeks    Status  Achieved        PT Long Term Goals - 10/31/17 1054      PT LONG TERM GOAL #1   Title  Patient will walk 1 mile without self report of pain     Baseline  able    Time  8    Period  Weeks    Status  Achieved      PT LONG TERM GOAL #2   Title  Patient will demsotrate a 30% limitation on FOTO     Time  8    Period  Weeks    Status  On-going      PT LONG TERM GOAL #3   Title  Patient will sleep through the night without increased pain     Time  8    Period  Weeks    Status  Unable to assess      PT LONG TERM GOAL #4   Title  Patient will stand for 1 hour without increased pain in order to perfrom daily tasks    Baseline  able    Time  8    Period  Weeks    Status  Achieved            Plan - 11/02/17 0942    Clinical Impression Statement  Able to tolerate Elliptical 4 minutes with increased ramp and resistance. Continued strengthening and balance exercises.  Improving SLS times bilateral.     PT Next Visit Plan  Check goals. continue to focus on neuro-musuclar re-ed and stabilization exerises .  Needs FOTO.    PT Home Exercise Plan  SLR; bridge, LAQ yellow; hamstring yellow; standing march     Consulted and Agree with Plan of Care  Patient       Patient will benefit from skilled therapeutic intervention in order to improve the following deficits and impairments:  Abnormal gait, Decreased range of motion, Pain, Decreased strength, Decreased activity tolerance, Impaired UE functional use, Decreased knowledge of use of DME  Visit Diagnosis: Acute pain of right knee  Acute pain of left knee  Other abnormalities of gait and mobility  Rupture of quadriceps tendon, right, sequela     Problem List Patient Active Problem List   Diagnosis Date Noted  . Candidiasis   . Abnormality of gait   . Hypoalbuminemia due to protein-calorie malnutrition ([place])   . History of gout   . Sleep disturbance   . Constipation   . Rupture of quadriceps tendon, right, sequela 06/19/2017  . Quadriceps tendon rupture, left, sequela 06/19/2017  . Acute blood loss anemia 06/19/2017  . Fall   . History of subdural hematoma   . Post-operative pain   . Recurrent falls   . Quadriceps tendon rupture 06/15/2017  . Primary osteoarthritis of both knees 02/28/2017  . Primary osteoarthritis of both hands 02/28/2017  . Uricacidemia 02/28/2017  . Pain in joint of left knee 02/07/2017  . Idiopathic chronic gout, unspecified site, without tophus (tophi) 11/29/2016  .  HEMATURIA UNSPECIFIED 01/25/2009  . ABDOMINAL PAIN 01/25/2009  . XERODERMA 03/10/2008  . UNS ADVRS EFF UNS RX MEDICINAL&BIOLOGICAL SBSTNC 03/10/2008  . ADJUSTMENT REACTION WITH PHYSICAL SYMPTOMS 02/14/2008  . MUSCLE SPASM 02/14/2008  . ACUTE PROSTATITIS 12/16/2007  . BREAST LUMP OR MASS, RIGHT 07/12/2007  . H/O: RCT (rotator cuff tear) 01/13/2007  . GOUT 01/08/2007    Dorene Ar,  PTA 11/02/2017, 9:48 AM  [place], [place], [address] Phone: [phone]   Fax:  [phone]  Name: Jose [name] MRN: [id number] Date of Birth: 29-Aug-1951

## 2017-11-05 ENCOUNTER — Telehealth (INDEPENDENT_AMBULATORY_CARE_PROVIDER_SITE_OTHER): Payer: Self-pay | Admitting: Orthopedic Surgery

## 2017-11-05 ENCOUNTER — Encounter: Payer: Self-pay | Admitting: Physical Therapy

## 2017-11-05 ENCOUNTER — Ambulatory Visit: Payer: Medicare Other | Admitting: Physical Therapy

## 2017-11-05 ENCOUNTER — Telehealth: Payer: Self-pay | Admitting: Cardiovascular Disease

## 2017-11-05 DIAGNOSIS — S76111S Strain of right quadriceps muscle, fascia and tendon, sequela: Secondary | ICD-10-CM

## 2017-11-05 DIAGNOSIS — M25561 Pain in right knee: Secondary | ICD-10-CM | POA: Diagnosis not present

## 2017-11-05 DIAGNOSIS — M25562 Pain in left knee: Secondary | ICD-10-CM

## 2017-11-05 DIAGNOSIS — R2689 Other abnormalities of gait and mobility: Secondary | ICD-10-CM | POA: Diagnosis not present

## 2017-11-05 NOTE — Telephone Encounter (Signed)
Pt is just giving you a call back. Says he will have his phone available today [phone]

## 2017-11-05 NOTE — Telephone Encounter (Signed)
Jose [name] is wanting the nurse to give him a call.  ( did want to leave content of message) . Please call

## 2017-11-05 NOTE — Telephone Encounter (Signed)
IC patient advised him that I had talked with Dr Marlou Sa and advised him that per Dr Marlou Sa, not likely that the Cipro had caused his bilateral quad tendon rupture.

## 2017-11-05 NOTE — Therapy (Signed)
[place], [place], [address] Phone: [phone]   Fax:  [phone]  Physical Therapy Treatment  Patient Details  Name: Jose [name] MRN: [id number] Date of Birth: 04-07-1951 Referring Provider: Dr Meredith Pel    Encounter Date: 11/05/2017  PT End of Session - 11/05/17 1316    Visit Number  22    Number of Visits  24    Date for PT Re-Evaluation  11/19/17    PT Start Time  0933    PT Stop Time  1026    PT Time Calculation (min)  53 min    Activity Tolerance  Patient tolerated treatment well    Behavior During Therapy  [facility] for tasks assessed/performed       Past Medical History:  Diagnosis Date  . Arthritis    R shoulder, great toes, hands- has gout & has injections in knees with Dr. Estanislado Pandy   . Cancer ([place])    skin ca-   . GERD (gastroesophageal reflux disease)   . History of hiatal hernia   . SDH (subdural hematoma) (HCC)     Past Surgical History:  Procedure Laterality Date  . BRAIN SURGERY  2016   evacuation of SDH  . CARPAL TUNNEL RELEASE Bilateral   . GREEN LIGHT LASER TURP (TRANSURETHRAL RESECTION OF PROSTATE  04/27/2017  . REPAIR QUADRICEP TENDON Bilateral 06/15/2017   Performed by Meredith Pel, MD at [facility]     There were no vitals filed for this visit.  Subjective Assessment - 11/05/17 0937    Subjective  Saw Dermatologist.  She said powerful antibiotics  have been known to affect tendons.      Currently in Pain?  No/denies    Pain Score  0-No pain    Aggravating Factors   -- has pins/ needles in feet  at night mostly  feet 2/10 numbness,  has been like that since surgery    Pain Location  Knee    Pain Orientation  Left    Pain Descriptors / Indicators  Aching                      [facility] Adult PT Treatment/Exercise - 11/05/17 0001      High Level Balance   High Level Balance Comments  Multiple balance activities in  bars,  narrow, single, static, dynamic, compliant and non compliant,    balance improved with rep increase      Knee/Hip Exercises: Aerobic   Elliptical  5 minutes ramp 1 endurance improving.      Knee/Hip Exercises: Machines for Strengthening   Total Gym Leg Press  60 LBS 20 X both and 10 X 2 each single   40#  then 80 both      Knee/Hip Exercises: Standing   Wall Squat  1 set;10 seconds      Knee/Hip Exercises: Supine   Quad Sets  10 reps;Both    Single Leg Bridge  5 reps;2 sets each,  min assist with lifted leg,  both      Cryotherapy   Number Minutes Cryotherapy  10 Minutes    Cryotherapy Location  Knee    Type of Cryotherapy  -- cold pack               PT Short Term Goals - 11/05/17 1319      PT SHORT TERM GOAL #1   Title  Patient will  increase bilateral lower extremity strength to 5/5     Time  4    Period  Weeks    Status  Unable to assess      PT SHORT TERM GOAL #2   Title  Patient will be independent with inital HEP     Baseline  independent    Time  4    Period  Weeks    Status  Achieved      PT SHORT TERM GOAL #3   Time  4    Period  Weeks    Status  Achieved        PT Long Term Goals - 11/05/17 1320      PT LONG TERM GOAL #1   Title  Patient will walk 1 mile without self report of pain     Baseline  able    Time  8    Period  Weeks    Status  Achieved      PT LONG TERM GOAL #2   Title  Patient will demsotrate a 30% limitation on FOTO     Baseline  30% limitation    Time  8    Period  Weeks    Status  Achieved      PT LONG TERM GOAL #3   Title  Patient will sleep through the night without increased pain     Baseline  occasionally wakes with pain in the night    Time  8    Period  Weeks    Status  On-going      PT LONG TERM GOAL #4   Title  Patient will stand for 1 hour without increased pain in order to perfrom daily tasks    Baseline  able    Time  8    Period  Weeks    Status  Achieved            Plan - 11/05/17  1317    Clinical Impression Statement  No pain increased during session today.  Balance activities improve with repitition. FOTO 30% limitation.(Predicted)  Pain will occasionally wake him at night.    PT Next Visit Plan   continue to focus on neuro-musuclar re-ed and stabilization exerises .      PT Home Exercise Plan  SLR; bridge, LAQ yellow; hamstring yellow; standing march     Consulted and Agree with Plan of Care  Patient       Patient will benefit from skilled therapeutic intervention in order to improve the following deficits and impairments:     Visit Diagnosis: Acute pain of right knee  Acute pain of left knee  Other abnormalities of gait and mobility  Rupture of quadriceps tendon, right, sequela     Problem List Patient Active Problem List   Diagnosis Date Noted  . Candidiasis   . Abnormality of gait   . Hypoalbuminemia due to protein-calorie malnutrition ([place])   . History of gout   . Sleep disturbance   . Constipation   . Rupture of quadriceps tendon, right, sequela 06/19/2017  . Quadriceps tendon rupture, left, sequela 06/19/2017  . Acute blood loss anemia 06/19/2017  . Fall   . History of subdural hematoma   . Post-operative pain   . Recurrent falls   . Quadriceps tendon rupture 06/15/2017  . Primary osteoarthritis of both knees 02/28/2017  . Primary osteoarthritis of both hands 02/28/2017  . Uricacidemia 02/28/2017  . Pain in joint of left knee 02/07/2017  . Idiopathic  chronic gout, unspecified site, without tophus (tophi) 11/29/2016  . HEMATURIA UNSPECIFIED 01/25/2009  . ABDOMINAL PAIN 01/25/2009  . XERODERMA 03/10/2008  . UNS ADVRS EFF UNS RX MEDICINAL&BIOLOGICAL SBSTNC 03/10/2008  . ADJUSTMENT REACTION WITH PHYSICAL SYMPTOMS 02/14/2008  . MUSCLE SPASM 02/14/2008  . ACUTE PROSTATITIS 12/16/2007  . BREAST LUMP OR MASS, RIGHT 07/12/2007  . H/O: RCT (rotator cuff tear) 01/13/2007  . GOUT 01/08/2007    Tawnee Clegg PTA 11/05/2017, 1:22 PM  [facility] [address] [place], [place], [address] Phone: [phone]   Fax:  [phone]  Name: Jose [name] MRN: [id number] Date of Birth: June 06, 1951

## 2017-11-05 NOTE — Telephone Encounter (Signed)
Has not been a clinical issue that I have seen

## 2017-11-05 NOTE — Telephone Encounter (Signed)
I tried calling patient back. No answer. LMVM advising was returning his call and could call back to discuss his concerns.

## 2017-11-05 NOTE — Telephone Encounter (Signed)
Patient would like a call back.  CB# is [phone].  Please advise.  Thank you.

## 2017-11-05 NOTE — Telephone Encounter (Signed)
Patient calling wanting to discuss a concern that has recently come up. Patient stated that throughout the past 3 years that he has been dealing with his prostate issues, procedures, ect., that they have given him Cipro. Patient recently discovered an article that states that Cipro can have a side effect of an aortic aneurysm. Patient stated what brought this about was his ortho doctor said it was rare for both quadricepts to go out at the same time. His ortho doctor stated that Cipro could have caused this. That is how the patient found the information. Patient wanted to know what Dr. Johnsie Cancel thought, and see if he has heard of this. Will forward to Dr. Johnsie Cancel.

## 2017-11-05 NOTE — Telephone Encounter (Signed)
Patient called wanting to speak with you about an issue he's having, asked to be more descriptive but said he only wanted to explain it once to you. CB #  [phone]

## 2017-11-06 ENCOUNTER — Telehealth (INDEPENDENT_AMBULATORY_CARE_PROVIDER_SITE_OTHER): Payer: Self-pay

## 2017-11-06 DIAGNOSIS — F438 Other reactions to severe stress: Secondary | ICD-10-CM | POA: Diagnosis not present

## 2017-11-06 NOTE — Progress Notes (Addendum)
Office Visit Note  Patient: Jose [name]             Date of Birth: 23-Nov-1951           MRN: [id number]             PCP: [name], MD Referring: [name], MD Visit Date: 11/16/2017 Occupation: @GUAROCC @    Subjective:  Tingling in bilateral feet.   History of Present Illness: Jose [name] is a 66 y.o. male with a history of chronic gout and osteoarthritis. Patient states in February 2018 he was on a skiing  trip and injured his left knee.  He states the pain was not severe and he did not have swelling.  He followed up with Dr. Marlou Sa who ordered an MRI.  He was diagnosed with a partial quadricep rupture. In June he fell and injured his right knee and had a partial quadricep rupture of  his right quadriceps.   Patient states he has periodically taken Ciprofloxacin over the past year from his urologist, and he wonders if this is related.  He had bilateral quadriceps ruptures in June.Both quadricep ruptures were repaired by Dr. Marlou Sa.  He wore bilateral braces during the summer, and he now wearing one on his left knee.  He experiences knee stiffness.  He is in physical therapy 4 times a week.  He has been using a cane   Since then he has been experiencing "pins and needles tingling" in bilateral feet."  He denies any numbness.    He states he has intermittently been having "gout-like pain" in his feet and large toe. He denies any joint swelling..  Activities of Daily Living:  Patient reports morning stiffness for 2 minutes.   Patient Reports nocturnal pain.  Difficulty dressing/grooming: Denies Difficulty climbing stairs: Reports Difficulty getting out of chair: Reports Difficulty using hands for taps, buttons, cutlery, and/or writing: Denies   Review of Systems  Constitutional: Positive for fatigue and weakness. Negative for night sweats.  HENT: Negative.  Negative for mouth sores, mouth dryness and nose dryness.   Eyes: Negative.  Negative for redness and dryness.    Respiratory: Negative.  Negative for shortness of breath and difficulty breathing.   Cardiovascular: Negative.  Negative for chest pain, palpitations, hypertension, irregular heartbeat and swelling in legs/feet.  Gastrointestinal: Negative.  Negative for constipation and diarrhea.  Endocrine: Negative.  Negative for increased urination.  Genitourinary: Positive for urgency.  Musculoskeletal: Positive for arthralgias, joint pain, joint swelling, myalgias, muscle weakness, morning stiffness and myalgias. Negative for muscle tenderness.  Skin: Negative for color change, rash, hair loss, nodules/bumps, skin tightness, ulcers and sensitivity to sunlight.  Allergic/Immunologic: Positive for susceptible to infections.  Neurological: Negative for dizziness, fainting, memory loss and night sweats.  Hematological: Negative for swollen glands.  Psychiatric/Behavioral: Positive for sleep disturbance. Negative for depressed mood. The patient is not nervous/anxious.     PMFS History:  Patient Active Problem List   Diagnosis Date Noted  . Candidiasis   . Abnormality of gait   . Hypoalbuminemia due to protein-calorie malnutrition ([place])   . History of gout   . Sleep disturbance   . Constipation   . Rupture of quadriceps tendon, right, sequela 06/19/2017  . Quadriceps tendon rupture, left, sequela 06/19/2017  . Acute blood loss anemia 06/19/2017  . Fall   . History of subdural hematoma   . Post-operative pain   . Recurrent falls   . Quadriceps tendon rupture 06/15/2017  . Primary osteoarthritis  of both knees 02/28/2017  . Primary osteoarthritis of both hands 02/28/2017  . Uricacidemia 02/28/2017  . Pain in joint of left knee 02/07/2017  . Idiopathic chronic gout, unspecified site, without tophus (tophi) 11/29/2016  . HEMATURIA UNSPECIFIED 01/25/2009  . ABDOMINAL PAIN 01/25/2009  . XERODERMA 03/10/2008  . UNS ADVRS EFF UNS RX MEDICINAL&BIOLOGICAL SBSTNC 03/10/2008  . ADJUSTMENT REACTION WITH  PHYSICAL SYMPTOMS 02/14/2008  . MUSCLE SPASM 02/14/2008  . ACUTE PROSTATITIS 12/16/2007  . BREAST LUMP OR MASS, RIGHT 07/12/2007  . H/O: RCT (rotator cuff tear) 01/13/2007  . GOUT 01/08/2007    Past Medical History:  Diagnosis Date  . Arthritis    R shoulder, great toes, hands- has gout & has injections in knees with Dr. Estanislado Pandy   . Cancer ([place])    skin ca-   . GERD (gastroesophageal reflux disease)   . History of hiatal hernia   . SDH (subdural hematoma) ([facility])     Family History  Problem Relation Age of Onset  . Parkinson's disease Mother   . Heart disease Father   . Parkinson's disease Brother   . Diabetes Brother    Past Surgical History:  Procedure Laterality Date  . BRAIN SURGERY  2016   evacuation of SDH  . CARPAL TUNNEL RELEASE Bilateral   . GREEN LIGHT LASER TURP (TRANSURETHRAL RESECTION OF PROSTATE  04/27/2017  . QUADRICEPS TENDON REPAIR Bilateral 06/15/2017   Procedure: REPAIR QUADRICEP TENDON;  Surgeon: Meredith Pel, MD;  Location: [facility];  Service: Orthopedics;  Laterality: Bilateral;  . SHOULDER SURGERY Right    Social History   Social History Narrative  . Not on file     Objective: Vital Signs: BP 119/79 (BP Location: Left Arm, Patient Position: Sitting, Cuff Size: Normal)   Pulse 68   Ht 6' (1.829 m)   Wt 201 lb (91.2 kg)   BMI 27.26 kg/m    Physical Exam  Constitutional: He is oriented to person, place, and time. He appears well-developed and well-nourished.  HENT:  Head: Normocephalic and atraumatic.  Eyes: Conjunctivae and EOM are normal. Pupils are equal, round, and reactive to light.  Neck: Normal range of motion. Neck supple.  Cardiovascular: Normal rate, regular rhythm and normal heart sounds.  Pulmonary/Chest: Effort normal and breath sounds normal.  Abdominal: Soft. Bowel sounds are normal.  Neurological: He is alert and oriented to person, place, and time.  Skin: Skin is warm and dry. Capillary refill takes less than 2  seconds.  Psychiatric: He has a normal mood and affect. His behavior is normal.  Nursing note and vitals reviewed.    Musculoskeletal Exam:   CDAI Exam: No CDAI exam completed.    Investigation: No additional findings.uric acid: 09/04/2017 5.0 CBC Latest Ref Rng & Units 09/04/2017 06/26/2017 06/20/2017  WBC 3.8 - 10.8 Thousand/uL 6.9 7.2 7.8  Hemoglobin 13.2 - 17.1 g/dL 14.1 12.4(L) 12.4(L)  Hematocrit 38.5 - 50.0 % 43.0 38.0(L) 37.4(L)  Platelets 140 - 400 Thousand/uL 239 286 231   CMP Latest Ref Rng & Units 11/12/2017 09/04/2017 06/26/2017  Glucose 65 - 99 mg/dL 90 76 95  BUN 8 - 27 mg/dL 23 20 21(H)  Creatinine 0.76 - 1.27 mg/dL 1.04 1.08 0.99  Sodium 134 - 144 mmol/L 142 141 139  Potassium 3.5 - 5.2 mmol/L 4.2 4.4 3.8  Chloride 96 - 106 mmol/L 105 106 106  CO2 20 - 29 mmol/L 22 26 26   Calcium 8.6 - 10.2 mg/dL 9.2 9.3 8.8(L)  Total Protein 6.1 -  8.1 g/dL - 6.3 -  Total Bilirubin 0.2 - 1.2 mg/dL - 0.2 -  Alkaline Phos 38 - 126 U/L - - -  AST 10 - 35 U/L - 14 -  ALT 9 - 46 U/L - 17 -    Imaging: No results found.  Speciality Comments: No specialty comments available.    Procedures:  No procedures performed Allergies: Patient has no known allergies.   Assessment / Plan:     Visit Diagnoses: Idiopathic chronic gout of multiple sites without tophus - allopurinol, colchicineUric acid: 09/04/2017 5.0 - Plan: Uric acid. Patient has not had any gout flares since his last visit. His uric acid isn't desirable range. He is on allopurinol.  Neuropathy: He's been having new onset paresthesias in his bilateral feet. He also had some numbness on palpation of his bilateral feet. He has a neurologist in the room. I've advised him to schedule an appointment with neurologist for nerve conduction velocities and evaluation of neuropathy.  Primary osteoarthritis of both knees: He has some chronic discomfort.  Primary osteoarthritis of both hands: He has osteoarthritic changes in his hands  which causes stiffness.  Rupture of quadriceps tendon, bilateral, sequela: He had bilateral quadriceps tendon repair by Dr. Marlou Sa and is going through rehabilitation currently. It is uncertain if his tendon rupture was related to Ciprofloxacillin use. He states that he has taken ciprofloxacin and twice in the last 1 year by his urologist.  Medication monitoring encounter - Plan: CBC with Differential/Platelet, COMPLETE METABOLIC PANEL WITH GFR, Uric acid in March for long-term allopurinol use.  Other medical problems are listed as follows:   Polio - child. decreased ROM in left shoulder joint.  Subdural hematoma ([place]) - 2016    Orders: Orders Placed This Encounter  Procedures  . CBC with Differential/Platelet  . COMPLETE METABOLIC PANEL WITH GFR  . Uric acid   No orders of the defined types were placed in this encounter.   Face-to-face time spent with patient was 30 minutes. greater than 50% of time was spent in counseling and coordination of care.  Follow-Up Instructions: No Follow-up on file.   Bo Merino, MD  Note - This record has been created using Editor, commissioning.  Chart creation errors have been sought, but may not always  have been located. Such creation errors do not reflect on  the standard of medical care.

## 2017-11-06 NOTE — Telephone Encounter (Signed)
Patient calling again to see if he can be worked into Dr. Estanislado Pandy schedule.  Stated that he is having foot pain.  CB# is [phone].  Please advise.  Thank you.

## 2017-11-06 NOTE — Telephone Encounter (Signed)
Patient states he is having discomfort in bilateral feet. Patient states it is like a pens and needles feeling. More like numbness. Started around the time of his bilateral knee surgery. Patient states he mentioned it to the surgeon but did not get it worked up through them. Patient has been scheduled for 11/16/17 at 9:00 am.

## 2017-11-06 NOTE — Telephone Encounter (Signed)
See previous phone message. 

## 2017-11-07 ENCOUNTER — Encounter: Payer: Self-pay | Admitting: Physical Therapy

## 2017-11-07 ENCOUNTER — Ambulatory Visit: Payer: Medicare Other | Admitting: Physical Therapy

## 2017-11-07 DIAGNOSIS — M25561 Pain in right knee: Secondary | ICD-10-CM | POA: Diagnosis not present

## 2017-11-07 DIAGNOSIS — M25562 Pain in left knee: Secondary | ICD-10-CM | POA: Diagnosis not present

## 2017-11-07 DIAGNOSIS — S76111S Strain of right quadriceps muscle, fascia and tendon, sequela: Secondary | ICD-10-CM | POA: Diagnosis not present

## 2017-11-07 DIAGNOSIS — R2689 Other abnormalities of gait and mobility: Secondary | ICD-10-CM

## 2017-11-07 NOTE — Therapy (Signed)
[place], [place], [address] Phone: [phone]   Fax:  [phone]  Physical Therapy Treatment  Patient Details  Name: Jose [name] MRN: [id number] Date of Birth: 1951/09/11 Referring Provider: Dr Meredith Pel    Encounter Date: 11/07/2017  PT End of Session - 11/07/17 0942    Visit Number  23    Number of Visits  24    Date for PT Re-Evaluation  11/19/17    Authorization Type  Blue cross blue shield     PT Start Time  0934    PT Stop Time  1015    PT Time Calculation (min)  41 min    Activity Tolerance  Patient tolerated treatment well    Behavior During Therapy  [facility] for tasks assessed/performed       Past Medical History:  Diagnosis Date  . Arthritis    R shoulder, great toes, hands- has gout & has injections in knees with Dr. Estanislado Pandy   . Cancer ([place])    skin ca-   . GERD (gastroesophageal reflux disease)   . History of hiatal hernia   . SDH (subdural hematoma) (HCC)     Past Surgical History:  Procedure Laterality Date  . BRAIN SURGERY  2016   evacuation of SDH  . CARPAL TUNNEL RELEASE Bilateral   . GREEN LIGHT LASER TURP (TRANSURETHRAL RESECTION OF PROSTATE  04/27/2017  . QUADRICEPS TENDON REPAIR Bilateral 06/15/2017   Procedure: REPAIR QUADRICEP TENDON;  Surgeon: Meredith Pel, MD;  Location: [facility];  Service: Orthopedics;  Laterality: Bilateral;  . SHOULDER SURGERY Right     There were no vitals filed for this visit.  Subjective Assessment - 11/07/17 0940    Subjective  Patient has no complaints today.  He is sleeping a little better at night. He is still using the cane in case he needs it.     Limitations  Lifting    How long can you stand comfortably?  < 20 minutes     Diagnostic tests  nothing post op     Currently in Pain?  No/denies                      [facility] Adult PT Treatment/Exercise - 11/07/17 0001      Knee/Hip Exercises: Aerobic   Elliptical  5 minutes ramp 1 endurance improving.      Knee/Hip Exercises: Machines for Strengthening   Total Gym Leg Press  60 LBS 20 X both and 10 X 2 each single   40#  then 80 both      Knee/Hip Exercises: Standing   SLS  25 sec right, 10 sec left , tandem stance x 60 sec left leg back     Other Standing Knee Exercises  step onto air-ex 2x10 forawrd and side step 2 x 10  .tandem gait ; rocker board x10 each side     Other Standing Knee Exercises  cone drill 2x10; air-ex; narrow base of support eyes closed 3x30sec hold; tandem stance on air-ex 2x20 sec bilateral       Knee/Hip Exercises: Supine   Bridges Limitations  2x10 with blue band     Single Leg Bridge  -- each,  min assist with lifted leg,  both      Cryotherapy   Number Minutes Cryotherapy  10 Minutes    Cryotherapy Location  Knee    Type of Cryotherapy  Ice pack  PT Education - 11/07/17 1422    Education provided  Yes    Education Details  reviewed exercises     Person(s) Educated  Patient    Methods  Explanation;Demonstration;Tactile cues;Verbal cues    Comprehension  Returned demonstration;Verbalized understanding;Verbal cues required;Tactile cues required       PT Short Term Goals - 11/05/17 1319      PT SHORT TERM GOAL #1   Title  Patient will increase bilateral lower extremity strength to 5/5     Time  4    Period  Weeks    Status  Unable to assess      PT SHORT TERM GOAL #2   Title  Patient will be independent with inital HEP     Baseline  independent    Time  4    Period  Weeks    Status  Achieved      PT SHORT TERM GOAL #3   Time  4    Period  Weeks    Status  Achieved        PT Long Term Goals - 11/05/17 1320      PT LONG TERM GOAL #1   Title  Patient will walk 1 mile without self report of pain     Baseline  able    Time  8    Period  Weeks    Status  Achieved      PT LONG TERM GOAL #2   Title  Patient will demsotrate a 30% limitation on FOTO     Baseline  30%  limitation    Time  8    Period  Weeks    Status  Achieved      PT LONG TERM GOAL #3   Title  Patient will sleep through the night without increased pain     Baseline  occasionally wakes with pain in the night    Time  8    Period  Weeks    Status  On-going      PT LONG TERM GOAL #4   Title  Patient will stand for 1 hour without increased pain in order to perfrom daily tasks    Baseline  able    Time  8    Period  Weeks    Status  Achieved            Plan - 11/07/17 0946    Clinical Impression Statement  Patient continues to make good progress. Patient will be seen 2-3x a week s to continue to work on strength and balance.     Clinical Presentation  Stable    Clinical Decision Making  Low    Rehab Potential  Good    PT Frequency  2x / week    PT Duration  8 weeks    PT Treatment/Interventions  ADLs/Self Care Home Management;Cryotherapy;Electrical Stimulation;Iontophoresis 4mg /ml Dexamethasone;Stair training;Gait training;Moist Heat;Therapeutic activities;Therapeutic exercise;Neuromuscular re-education;Patient/family education;Passive range of motion;Manual techniques;Taping;Ultrasound    PT Next Visit Plan   continue to focus on neuro-musuclar re-ed and stabilization exerises .      PT Home Exercise Plan  SLR; bridge, LAQ yellow; hamstring yellow; standing march     Consulted and Agree with Plan of Care  Patient       Patient will benefit from skilled therapeutic intervention in order to improve the following deficits and impairments:  Abnormal gait, Decreased range of motion, Pain, Decreased strength, Decreased activity tolerance, Impaired UE functional use, Decreased knowledge of use of DME  Visit Diagnosis: Acute pain of right knee  Acute pain of left knee  Other abnormalities of gait and mobility     Problem List Patient Active Problem List   Diagnosis Date Noted  . Candidiasis   . Abnormality of gait   . Hypoalbuminemia due to protein-calorie malnutrition  ([place])   . History of gout   . Sleep disturbance   . Constipation   . Rupture of quadriceps tendon, right, sequela 06/19/2017  . Quadriceps tendon rupture, left, sequela 06/19/2017  . Acute blood loss anemia 06/19/2017  . Fall   . History of subdural hematoma   . Post-operative pain   . Recurrent falls   . Quadriceps tendon rupture 06/15/2017  . Primary osteoarthritis of both knees 02/28/2017  . Primary osteoarthritis of both hands 02/28/2017  . Uricacidemia 02/28/2017  . Pain in joint of left knee 02/07/2017  . Idiopathic chronic gout, unspecified site, without tophus (tophi) 11/29/2016  . HEMATURIA UNSPECIFIED 01/25/2009  . ABDOMINAL PAIN 01/25/2009  . XERODERMA 03/10/2008  . UNS ADVRS EFF UNS RX MEDICINAL&BIOLOGICAL SBSTNC 03/10/2008  . ADJUSTMENT REACTION WITH PHYSICAL SYMPTOMS 02/14/2008  . MUSCLE SPASM 02/14/2008  . ACUTE PROSTATITIS 12/16/2007  . BREAST LUMP OR MASS, RIGHT 07/12/2007  . H/O: RCT (rotator cuff tear) 01/13/2007  . GOUT 01/08/2007    Carney Living 11/07/2017, 8:51 PM  [facility] [address] [place], [place], [address] Phone: [phone]   Fax:  [phone]  Name: Jose [name] MRN: [id number] Date of Birth: 16-Mar-1951

## 2017-11-12 ENCOUNTER — Ambulatory Visit: Payer: Medicare Other | Admitting: Physical Therapy

## 2017-11-12 ENCOUNTER — Encounter: Payer: Self-pay | Admitting: Physical Therapy

## 2017-11-12 ENCOUNTER — Other Ambulatory Visit: Payer: Medicare Other | Admitting: *Deleted

## 2017-11-12 DIAGNOSIS — Q231 Congenital insufficiency of aortic valve: Secondary | ICD-10-CM | POA: Diagnosis not present

## 2017-11-12 DIAGNOSIS — R2689 Other abnormalities of gait and mobility: Secondary | ICD-10-CM | POA: Diagnosis not present

## 2017-11-12 DIAGNOSIS — S76111S Strain of right quadriceps muscle, fascia and tendon, sequela: Secondary | ICD-10-CM | POA: Diagnosis not present

## 2017-11-12 DIAGNOSIS — M25562 Pain in left knee: Secondary | ICD-10-CM | POA: Diagnosis not present

## 2017-11-12 DIAGNOSIS — I350 Nonrheumatic aortic (valve) stenosis: Secondary | ICD-10-CM

## 2017-11-12 DIAGNOSIS — Q2381 Bicuspid aortic valve: Secondary | ICD-10-CM

## 2017-11-12 DIAGNOSIS — M25561 Pain in right knee: Secondary | ICD-10-CM | POA: Diagnosis not present

## 2017-11-12 LAB — BASIC METABOLIC PANEL
BUN / CREAT RATIO: 22 (ref 10–24)
BUN: 23 mg/dL (ref 8–27)
CO2: 22 mmol/L (ref 20–29)
CREATININE: 1.04 mg/dL (ref 0.76–1.27)
Calcium: 9.2 mg/dL (ref 8.6–10.2)
Chloride: 105 mmol/L (ref 96–106)
GFR calc Af Amer: 85 mL/min/{1.73_m2} (ref >59)
GFR, EST NON AFRICAN AMERICAN: 73 mL/min/{1.73_m2} (ref >59)
GLUCOSE: 90 mg/dL (ref 65–99)
POTASSIUM: 4.2 mmol/L (ref 3.5–5.2)
SODIUM: 142 mmol/L (ref 134–144)

## 2017-11-12 NOTE — Therapy (Signed)
[place], [place], [address] Phone: [phone]   Fax:  [phone]  Physical Therapy Treatment/ recertification  Patient Details  Name: Jose [name] MRN: [id number] Date of Birth: 02-09-1951 Referring Provider: Dr Meredith Pel    Encounter Date: 11/12/2017  PT End of Session - 11/12/17 0851    Visit Number  24    Number of Visits  34    Date for PT Re-Evaluation  12/17/17    Authorization Type  Blue cross blue shield     PT Start Time  0846    PT Stop Time  0930    PT Time Calculation (min)  44 min    Activity Tolerance  Patient tolerated treatment well    Behavior During Therapy  [facility] for tasks assessed/performed       Past Medical History:  Diagnosis Date  . Arthritis    R shoulder, great toes, hands- has gout & has injections in knees with Dr. Estanislado Pandy   . Cancer ([place])    skin ca-   . GERD (gastroesophageal reflux disease)   . History of hiatal hernia   . SDH (subdural hematoma) (HCC)     Past Surgical History:  Procedure Laterality Date  . BRAIN SURGERY  2016   evacuation of SDH  . CARPAL TUNNEL RELEASE Bilateral   . GREEN LIGHT LASER TURP (TRANSURETHRAL RESECTION OF PROSTATE  04/27/2017  . QUADRICEPS TENDON REPAIR Bilateral 06/15/2017   Procedure: REPAIR QUADRICEP TENDON;  Surgeon: Meredith Pel, MD;  Location: [facility];  Service: Orthopedics;  Laterality: Bilateral;  . SHOULDER SURGERY Right     There were no vitals filed for this visit.  Subjective Assessment - 11/12/17 0850    Subjective  Patient feels like he is having pressure in his knees. He was able to go the entire day yesterday without the brace. His knee did not buckle. He is not having much pain just the pressure in his knees.     Limitations  Lifting    How long can you stand comfortably?  < 20 minutes     Diagnostic tests  nothing post op     Currently in Pain?  No/denies         [facility] PT Assessment -  11/12/17 0001      Strength   Right Hip Flexion  5/5    Right Hip ABduction  5/5    Left Hip Flexion  4+/5    Left Hip ABduction  4/5    Right Knee Flexion  5/5    Right Knee Extension  5/5    Left Knee Flexion  5/5    Left Knee Extension  4+/5      Palpation   Palpation comment  contiues to have indentation between patella and quad muscle on the left but does not appear to have changed.                   [facility] Adult PT Treatment/Exercise - 11/12/17 0001      Knee/Hip Exercises: Aerobic   Elliptical  5 minutes ramp 1 endurance improving.      Knee/Hip Exercises: Machines for Strengthening   Total Gym Leg Press  60 LBS 20 X both and 10 X 2 each single   40#  then 80 both      Knee/Hip Exercises: Standing   SLS  25 sec right, 10 sec left , tandem stance x 60 sec left leg back  Other Standing Knee Exercises  step onto air-ex 2x10 forawrd and side step 2 x 10  .tandem gait ; rocker board x10 each side; cable walk 2x10     Other Standing Knee Exercises  cone drill 2x10; air-ex; narrow base of support eyes closed 3x30sec hold; tandem stance on air-ex 2x20 sec bilateral       Knee/Hip Exercises: Supine   Bridges Limitations  2x10 with blue band     Single Leg Bridge  -- each,  min assist with lifted leg,  both      Cryotherapy   Number Minutes Cryotherapy  10 Minutes    Cryotherapy Location  Knee    Type of Cryotherapy  Ice pack             PT Education - 11/12/17 0851    Education provided  Yes    Education Details  reviewed technique with exercises.     Person(s) Educated  Patient    Methods  Explanation;Demonstration;Tactile cues;Verbal cues    Comprehension  Verbalized understanding;Returned demonstration;Verbal cues required;Tactile cues required       PT Short Term Goals - 11/12/17 0932      PT SHORT TERM GOAL #1   Title  Patient will increase bilateral lower extremity strength to 5/5     Baseline  4+/5 left lower extremity 5/5 right     Status   Partially Met      PT SHORT TERM GOAL #2   Title  Patient will be independent with inital HEP     Baseline  independent    Time  4    Period  Weeks    Status  Achieved      PT SHORT TERM GOAL #3   Title  Patient will demsotrate 20 second single leg stance bilateral     Baseline  continues to have         PT Long Term Goals - 11/05/17 1320      PT LONG TERM GOAL #1   Title  Patient will walk 1 mile without self report of pain     Baseline  able    Time  8    Period  Weeks    Status  Achieved      PT LONG TERM GOAL #2   Title  Patient will demsotrate a 30% limitation on FOTO     Baseline  30% limitation    Time  8    Period  Weeks    Status  Achieved      PT LONG TERM GOAL #3   Title  Patient will sleep through the night without increased pain     Baseline  occasionally wakes with pain in the night    Time  8    Period  Weeks    Status  On-going      PT LONG TERM GOAL #4   Title  Patient will stand for 1 hour without increased pain in order to perfrom daily tasks    Baseline  able    Time  8    Period  Weeks    Status  Achieved            Plan - 11/12/17 0904    Clinical Impression Statement  Patient's satrength has improved. Patient still feels like his left not as stable. He has some instability with walking and stairs. Therapy will continue to work on functional training.     Clinical Presentation  Stable  Clinical Decision Making  Low    Rehab Potential  Good    PT Frequency  2x / week    PT Duration  8 weeks    PT Treatment/Interventions  ADLs/Self Care Home Management;Cryotherapy;Electrical Stimulation;Iontophoresis 61m/ml Dexamethasone;Stair training;Gait training;Moist Heat;Therapeutic activities;Therapeutic exercise;Neuromuscular re-education;Patient/family education;Passive range of motion;Manual techniques;Taping;Ultrasound    PT Next Visit Plan   continue to focus on neuro-musuclar re-ed and stabilization exerises .      PT Home Exercise Plan   SLR; bridge, LAQ yellow; hamstring yellow; standing march     Consulted and Agree with Plan of Care  Patient       Patient will benefit from skilled therapeutic intervention in order to improve the following deficits and impairments:  Pain  Visit Diagnosis: Acute pain of right knee  Acute pain of left knee  Other abnormalities of gait and mobility  Rupture of quadriceps tendon, right, sequela     Problem List Patient Active Problem List   Diagnosis Date Noted  . Candidiasis   . Abnormality of gait   . Hypoalbuminemia due to protein-calorie malnutrition ([place]   . History of gout   . Sleep disturbance   . Constipation   . Rupture of quadriceps tendon, right, sequela 06/19/2017  . Quadriceps tendon rupture, left, sequela 06/19/2017  . Acute blood loss anemia 06/19/2017  . Fall   . History of subdural hematoma   . Post-operative pain   . Recurrent falls   . Quadriceps tendon rupture 06/15/2017  . Primary osteoarthritis of both knees 02/28/2017  . Primary osteoarthritis of both hands 02/28/2017  . Uricacidemia 02/28/2017  . Pain in joint of left knee 02/07/2017  . Idiopathic chronic gout, unspecified site, without tophus (tophi) 11/29/2016  . HEMATURIA UNSPECIFIED 01/25/2009  . ABDOMINAL PAIN 01/25/2009  . XERODERMA 03/10/2008  . UNS ADVRS EFF UNS RX MEDICINAL&BIOLOGICAL SBSTNC 03/10/2008  . ADJUSTMENT REACTION WITH PHYSICAL SYMPTOMS 02/14/2008  . MUSCLE SPASM 02/14/2008  . ACUTE PROSTATITIS 12/16/2007  . BREAST LUMP OR MASS, RIGHT 07/12/2007  . H/O: RCT (rotator cuff tear) 01/13/2007  . GOUT 01/08/2007    DCarney LivingPT DPT  11/12/2017, 11:41 AM  [facility] [address]Phone: [phone]  Fax:  [phone] Name: Jose CAMBREMRN: [id number] of Birth: 101/22/1952

## 2017-11-12 NOTE — Telephone Encounter (Signed)
Patient called back. Informed patient of Dr. Kyla Balzarine message. Patient verbalized understanding.

## 2017-11-12 NOTE — Telephone Encounter (Signed)
Left message for patient to call back  

## 2017-11-13 DIAGNOSIS — F438 Other reactions to severe stress: Secondary | ICD-10-CM | POA: Diagnosis not present

## 2017-11-14 ENCOUNTER — Other Ambulatory Visit (INDEPENDENT_AMBULATORY_CARE_PROVIDER_SITE_OTHER): Payer: Self-pay | Admitting: Orthopedic Surgery

## 2017-11-14 ENCOUNTER — Encounter: Payer: Self-pay | Admitting: Physical Therapy

## 2017-11-14 ENCOUNTER — Ambulatory Visit: Payer: Medicare Other | Admitting: Physical Therapy

## 2017-11-14 DIAGNOSIS — M25561 Pain in right knee: Secondary | ICD-10-CM

## 2017-11-14 DIAGNOSIS — R2689 Other abnormalities of gait and mobility: Secondary | ICD-10-CM

## 2017-11-14 DIAGNOSIS — S76111S Strain of right quadriceps muscle, fascia and tendon, sequela: Secondary | ICD-10-CM | POA: Diagnosis not present

## 2017-11-14 DIAGNOSIS — M25562 Pain in left knee: Secondary | ICD-10-CM

## 2017-11-14 NOTE — Therapy (Signed)
[place], [place], [address] Phone: [phone]   Fax:  [phone]  Physical Therapy Treatment  Patient Details  Name: Jose [name] MRN: [id number] Date of Birth: 1950-12-23 Referring Provider: Dr Meredith Pel    Encounter Date: 11/14/2017  PT End of Session - 11/14/17 0940    Visit Number  25    Number of Visits  34    Date for PT Re-Evaluation  12/17/17    PT Start Time  0845    PT Stop Time  0946    PT Time Calculation (min)  61 min    Activity Tolerance  Patient tolerated treatment well    Behavior During Therapy  [facility] for tasks assessed/performed       Past Medical History:  Diagnosis Date  . Arthritis    R shoulder, great toes, hands- has gout & has injections in knees with Dr. Estanislado Pandy   . Cancer ([place])    skin ca-   . GERD (gastroesophageal reflux disease)   . History of hiatal hernia   . SDH (subdural hematoma) (HCC)     Past Surgical History:  Procedure Laterality Date  . BRAIN SURGERY  2016   evacuation of SDH  . CARPAL TUNNEL RELEASE Bilateral   . GREEN LIGHT LASER TURP (TRANSURETHRAL RESECTION OF PROSTATE  04/27/2017  . QUADRICEPS TENDON REPAIR Bilateral 06/15/2017   Procedure: REPAIR QUADRICEP TENDON;  Surgeon: Meredith Pel, MD;  Location: [facility];  Service: Orthopedics;  Laterality: Bilateral;  . SHOULDER SURGERY Right     There were no vitals filed for this visit.  Subjective Assessment - 11/14/17 0856    Subjective  Patient has been walking inside without brace and has noticed some slight "weakness"  almost give away,   No pain.      Currently in Pain?  No/denies    Pain Score  0-No pain 2-4/10 pain upon standin from sitting    Pain Location  Knee    Pain Orientation  Right;Left    Pain Descriptors / Indicators  Sharp pressure distal quads,  both    Pain Frequency  Intermittent    Aggravating Factors   standing up from lower surfaces.                       [facility] Adult PT Treatment/Exercise - 11/14/17 0001      High Level Balance   High Level Balance Comments  Kettle bell, 25 LBS at side walking hand right,  hand left,  single leg stand with @ Lbs on bar lift with 2 hands 5-8 X,  each,  warrior pose, each, ,  single leg stand with 5 LB kettlebell moving bell out and in with both hands,  both,   etc,  standing Baps L3  single leg stand, then DF/PF.  Unable to SLS on Lt on BAPS,  even with hands on counter,  Close SBA for all.      Knee/Hip Exercises: Aerobic   Nustep  8 minutes L5.  patient's request.      Knee/Hip Exercises: Machines for Strengthening   Cybex Knee Flexion  35. 45,, 55  X 10,  35 X both x    Total Gym Leg Press  80 X 20 both,  20 LBS single   Lt 12 X single right   12,  left  12,  both 20 LBs x  20      Cryotherapy  Number Minutes Cryotherapy  15 Minutes    Cryotherapy Location  Knee Both elevated    Type of Cryotherapy  -- cold packs               PT Short Term Goals - 11/12/17 0932      PT SHORT TERM GOAL #1   Title  Patient will increase bilateral lower extremity strength to 5/5     Baseline  4+/5 left lower extremity 5/5 right     Status  Partially Met      PT SHORT TERM GOAL #2   Title  Patient will be independent with inital HEP     Baseline  independent    Time  4    Period  Weeks    Status  Achieved      PT SHORT TERM GOAL #3   Title  Patient will demsotrate 20 second single leg stance bilateral     Baseline  continues to have         PT Long Term Goals - 11/05/17 1320      PT LONG TERM GOAL #1   Title  Patient will walk 1 mile without self report of pain     Baseline  able    Time  8    Period  Weeks    Status  Achieved      PT LONG TERM GOAL #2   Title  Patient will demsotrate a 30% limitation on FOTO     Baseline  30% limitation    Time  8    Period  Weeks    Status  Achieved      PT LONG TERM GOAL #3   Title  Patient will sleep through the  night without increased pain     Baseline  occasionally wakes with pain in the night    Time  8    Period  Weeks    Status  On-going      PT LONG TERM GOAL #4   Title  Patient will stand for 1 hour without increased pain in order to perfrom daily tasks    Baseline  able    Time  8    Period  Weeks    Status  Achieved            Plan - 11/14/17 0940    Clinical Impression Statement  patient's almost "Give away"  happens only with brace off.  The strap distal quads may be helping with quad strength.  Balance activities continues with ankle control lacking in challanging exercises.  No pain reported post session or during session.   He  is concerned about divot getting bigger.  I referred him to the MD.  There is no obvious change in divot.    PT Next Visit Plan   continue to focus on neuro-musuclar re-ed and stabilization exerises .      PT Home Exercise Plan  SLR; bridge, LAQ yellow; hamstring yellow; standing march     Consulted and Agree with Plan of Care  Patient       Patient will benefit from skilled therapeutic intervention in order to improve the following deficits and impairments:     Visit Diagnosis: Acute pain of right knee  Acute pain of left knee  Other abnormalities of gait and mobility  Rupture of quadriceps tendon, right, sequela     Problem List Patient Active Problem List   Diagnosis Date Noted  . Candidiasis   . Abnormality  of gait   . Hypoalbuminemia due to protein-calorie malnutrition ([place])   . History of gout   . Sleep disturbance   . Constipation   . Rupture of quadriceps tendon, right, sequela 06/19/2017  . Quadriceps tendon rupture, left, sequela 06/19/2017  . Acute blood loss anemia 06/19/2017  . Fall   . History of subdural hematoma   . Post-operative pain   . Recurrent falls   . Quadriceps tendon rupture 06/15/2017  . Primary osteoarthritis of both knees 02/28/2017  . Primary osteoarthritis of both hands 02/28/2017  . Uricacidemia  02/28/2017  . Pain in joint of left knee 02/07/2017  . Idiopathic chronic gout, unspecified site, without tophus (tophi) 11/29/2016  . HEMATURIA UNSPECIFIED 01/25/2009  . ABDOMINAL PAIN 01/25/2009  . XERODERMA 03/10/2008  . UNS ADVRS EFF UNS RX MEDICINAL&BIOLOGICAL SBSTNC 03/10/2008  . ADJUSTMENT REACTION WITH PHYSICAL SYMPTOMS 02/14/2008  . MUSCLE SPASM 02/14/2008  . ACUTE PROSTATITIS 12/16/2007  . BREAST LUMP OR MASS, RIGHT 07/12/2007  . H/O: RCT (rotator cuff tear) 01/13/2007  . GOUT 01/08/2007    Zhane Donlan PTA 11/14/2017, 9:47 AM  [facility] [address] [place], [place], [address] Phone: [phone]   Fax:  [phone]  Name: Jose [name] MRN: [id number] Date of Birth: 1951-05-07

## 2017-11-15 DIAGNOSIS — C44612 Basal cell carcinoma of skin of right upper limb, including shoulder: Secondary | ICD-10-CM | POA: Diagnosis not present

## 2017-11-16 ENCOUNTER — Encounter: Payer: Self-pay | Admitting: Rheumatology

## 2017-11-16 ENCOUNTER — Ambulatory Visit: Payer: Medicare Other | Admitting: Physical Therapy

## 2017-11-16 ENCOUNTER — Encounter: Payer: Self-pay | Admitting: Physical Therapy

## 2017-11-16 ENCOUNTER — Ambulatory Visit (INDEPENDENT_AMBULATORY_CARE_PROVIDER_SITE_OTHER): Payer: Medicare Other | Admitting: Rheumatology

## 2017-11-16 VITALS — BP 119/79 | HR 68 | Ht 72.0 in | Wt 201.0 lb

## 2017-11-16 DIAGNOSIS — Z5181 Encounter for therapeutic drug level monitoring: Secondary | ICD-10-CM

## 2017-11-16 DIAGNOSIS — R2689 Other abnormalities of gait and mobility: Secondary | ICD-10-CM | POA: Diagnosis not present

## 2017-11-16 DIAGNOSIS — G629 Polyneuropathy, unspecified: Secondary | ICD-10-CM | POA: Diagnosis not present

## 2017-11-16 DIAGNOSIS — M170 Bilateral primary osteoarthritis of knee: Secondary | ICD-10-CM | POA: Diagnosis not present

## 2017-11-16 DIAGNOSIS — S76111S Strain of right quadriceps muscle, fascia and tendon, sequela: Secondary | ICD-10-CM | POA: Diagnosis not present

## 2017-11-16 DIAGNOSIS — M25562 Pain in left knee: Secondary | ICD-10-CM

## 2017-11-16 DIAGNOSIS — M25561 Pain in right knee: Secondary | ICD-10-CM | POA: Diagnosis not present

## 2017-11-16 DIAGNOSIS — S065X9A Traumatic subdural hemorrhage with loss of consciousness of unspecified duration, initial encounter: Secondary | ICD-10-CM

## 2017-11-16 DIAGNOSIS — M1A09X0 Idiopathic chronic gout, multiple sites, without tophus (tophi): Secondary | ICD-10-CM

## 2017-11-16 DIAGNOSIS — M19041 Primary osteoarthritis, right hand: Secondary | ICD-10-CM | POA: Diagnosis not present

## 2017-11-16 DIAGNOSIS — S76112S Strain of left quadriceps muscle, fascia and tendon, sequela: Secondary | ICD-10-CM | POA: Diagnosis not present

## 2017-11-16 DIAGNOSIS — M19042 Primary osteoarthritis, left hand: Secondary | ICD-10-CM | POA: Diagnosis not present

## 2017-11-16 DIAGNOSIS — A809 Acute poliomyelitis, unspecified: Secondary | ICD-10-CM | POA: Diagnosis not present

## 2017-11-16 DIAGNOSIS — S065XAA Traumatic subdural hemorrhage with loss of consciousness status unknown, initial encounter: Secondary | ICD-10-CM

## 2017-11-16 NOTE — Patient Instructions (Addendum)
Please schedule an appointment with your neurologist to have a work-up for bilateral peripheral neuropathy.     Standing Labs We placed an order today for your standing lab work.    Please come back and get your standing labs in March and every 6 months following (CBC, CMP, and uric acid)  We have open lab Monday through Friday from 8:30-11:30 AM and 1:30-4 PM at the office of Dr. Bo Merino.   The office is located at [address] [place], [place], [place] [address] No appointment is necessary.   Labs are drawn by [facility].  You may receive a bill from [facility] for your lab work. If you have any questions regarding directions or hours of operation,  please call [phone].

## 2017-11-18 NOTE — Therapy (Signed)
[place], [place], [address] Phone: [phone]   Fax:  [phone]  Physical Therapy Treatment  Patient Details  Name: Jose [name] MRN: [id number] Date of Birth: 08-05-1951 Referring Provider: Dr Meredith Pel    Encounter Date: 11/16/2017  PT End of Session - 11/18/17 2126    Visit Number  26    Number of Visits  34    Date for PT Re-Evaluation  12/17/17    Authorization Type  [employer] blue shield     PT Start Time  0930    PT Stop Time  1022    PT Time Calculation (min)  52 min    Activity Tolerance  Patient tolerated treatment well    Behavior During Therapy  [facility] for tasks assessed/performed       Past Medical History:  Diagnosis Date  . Arthritis    R shoulder, great toes, hands- has gout & has injections in knees with Dr. Estanislado Pandy   . Cancer ([place])    skin ca-   . GERD (gastroesophageal reflux disease)   . History of hiatal hernia   . SDH (subdural hematoma) (HCC)     Past Surgical History:  Procedure Laterality Date  . BRAIN SURGERY  2016   evacuation of SDH  . CARPAL TUNNEL RELEASE Bilateral   . GREEN LIGHT LASER TURP (TRANSURETHRAL RESECTION OF PROSTATE  04/27/2017  . QUADRICEPS TENDON REPAIR Bilateral 06/15/2017   Procedure: REPAIR QUADRICEP TENDON;  Surgeon: Meredith Pel, MD;  Location: [facility];  Service: Orthopedics;  Laterality: Bilateral;  . SHOULDER SURGERY Right     There were no vitals filed for this visit.  Subjective Assessment - 11/18/17 2114    Subjective  Patient reports that yesterday his leg gave way on the left. It has been happening frequently on the left and also from time to time on the right. He has been to pain mangement today.     Limitations  Lifting    How long can you stand comfortably?  < 20 minutes     Diagnostic tests  nothing post op     Currently in Pain?  No/denies                      [facility] Adult PT  Treatment/Exercise - 11/18/17 2118      Ambulation/Gait   Gait Comments  Up and down steps with cuing. Does not have good control with left LE going up and down stairs.       High Level Balance   High Level Balance Comments  tandem stance EC; narrow bace on air-ex EC; step onto air-ex;       Knee/Hip Exercises: Stretches   Active Hamstring Stretch Limitations  with strap 3x20 sec hold       Knee/Hip Exercises: Aerobic   Nustep  5 min Level 5       Knee/Hip Exercises: Machines for Strengthening   Cybex Knee Flexion  35. 45,, 55  X 10,  35 X both x      Knee/Hip Exercises: Supine   Bridges Limitations  2x10 with blue band     Straight Leg Raises Limitations  2x10 2lb each leg       Cryotherapy   Number Minutes Cryotherapy  10 Minutes    Cryotherapy Location  Knee Both elevated    Type of Cryotherapy  Ice pack  PT Education - 11/18/17 2115    Education provided  Yes    Education Details  reviewed expected outcomes and improtance of continued     Person(s) Educated  Patient    Methods  Explanation;Demonstration;Tactile cues;Verbal cues    Comprehension  Verbalized understanding;Returned demonstration;Verbal cues required;Tactile cues required       PT Short Term Goals - 11/12/17 0932      PT SHORT TERM GOAL #1   Title  Patient will increase bilateral lower extremity strength to 5/5     Baseline  4+/5 left lower extremity 5/5 right     Status  Partially Met      PT SHORT TERM GOAL #2   Title  Patient will be independent with inital HEP     Baseline  independent    Time  4    Period  Weeks    Status  Achieved      PT SHORT TERM GOAL #3   Title  Patient will demsotrate 20 second single leg stance bilateral     Baseline  continues to have         PT Long Term Goals - 11/05/17 1320      PT LONG TERM GOAL #1   Title  Patient will walk 1 mile without self report of pain     Baseline  able    Time  8    Period  Weeks    Status  Achieved      PT  LONG TERM GOAL #2   Title  Patient will demsotrate a 30% limitation on FOTO     Baseline  30% limitation    Time  8    Period  Weeks    Status  Achieved      PT LONG TERM GOAL #3   Title  Patient will sleep through the night without increased pain     Baseline  occasionally wakes with pain in the night    Time  8    Period  Weeks    Status  On-going      PT LONG TERM GOAL #4   Title  Patient will stand for 1 hour without increased pain in order to perfrom daily tasks    Baseline  able    Time  8    Period  Weeks    Status  Achieved            Plan - 11/18/17 2116    Clinical Impression Statement  Therapy reviewed patients gait on steps. He continues to have to vault with the left but has a smooth gait on the right. He also did well with balance activity. No sing of buckling during treatment. Patient reports on the days he has therapy he has very little actual buckling. He does his exercises on the days he is not here but does not seem to have the same activation. Therapy will contrinue     Clinical Presentation  Stable    Clinical Decision Making  Low    Rehab Potential  Good    PT Frequency  2x / week    PT Duration  8 weeks    PT Treatment/Interventions  ADLs/Self Care Home Management;Cryotherapy;Electrical Stimulation;Iontophoresis 58m/ml Dexamethasone;Stair training;Gait training;Moist Heat;Therapeutic activities;Therapeutic exercise;Neuromuscular re-education;Patient/family education;Passive range of motion;Manual techniques;Taping;Ultrasound    PT Next Visit Plan   continue to focus on neuro-musuclar re-ed and stabilization exerises .      PT Home Exercise Plan  SLR; bridge, LAQ yellow; hamstring  yellow; standing march     Consulted and Agree with Plan of Care  Patient       Patient will benefit from skilled therapeutic intervention in order to improve the following deficits and impairments:  Pain  Visit Diagnosis: Acute pain of right knee  Acute pain of left  knee  Other abnormalities of gait and mobility  Rupture of quadriceps tendon, right, sequela     Problem List Patient Active Problem List   Diagnosis Date Noted  . Candidiasis   . Abnormality of gait   . Hypoalbuminemia due to protein-calorie malnutrition ([place])   . History of gout   . Sleep disturbance   . Constipation   . Rupture of quadriceps tendon, right, sequela 06/19/2017  . Quadriceps tendon rupture, left, sequela 06/19/2017  . Acute blood loss anemia 06/19/2017  . Fall   . History of subdural hematoma   . Post-operative pain   . Recurrent falls   . Quadriceps tendon rupture 06/15/2017  . Primary osteoarthritis of both knees 02/28/2017  . Primary osteoarthritis of both hands 02/28/2017  . Uricacidemia 02/28/2017  . Pain in joint of left knee 02/07/2017  . Idiopathic chronic gout, unspecified site, without tophus (tophi) 11/29/2016  . HEMATURIA UNSPECIFIED 01/25/2009  . ABDOMINAL PAIN 01/25/2009  . XERODERMA 03/10/2008  . UNS ADVRS EFF UNS RX MEDICINAL&BIOLOGICAL SBSTNC 03/10/2008  . ADJUSTMENT REACTION WITH PHYSICAL SYMPTOMS 02/14/2008  . MUSCLE SPASM 02/14/2008  . ACUTE PROSTATITIS 12/16/2007  . BREAST LUMP OR MASS, RIGHT 07/12/2007  . H/O: RCT (rotator cuff tear) 01/13/2007  . GOUT 01/08/2007    Carney Living  PT DPT  11/18/2017, 9:27 PM  [facility] [address] [place], [place], [address] Phone: [phone]   Fax:  [phone]  Name: ANTIONNE ENRIQUE MRN: [id number] Date of Birth: 05/08/1951

## 2017-11-19 ENCOUNTER — Ambulatory Visit: Payer: Medicare Other | Admitting: Physical Therapy

## 2017-11-21 ENCOUNTER — Ambulatory Visit: Payer: Medicare Other | Attending: Internal Medicine | Admitting: Physical Therapy

## 2017-11-21 DIAGNOSIS — F438 Other reactions to severe stress: Secondary | ICD-10-CM | POA: Diagnosis not present

## 2017-11-21 DIAGNOSIS — M25562 Pain in left knee: Secondary | ICD-10-CM | POA: Insufficient documentation

## 2017-11-21 DIAGNOSIS — M25561 Pain in right knee: Secondary | ICD-10-CM | POA: Insufficient documentation

## 2017-11-21 DIAGNOSIS — R2689 Other abnormalities of gait and mobility: Secondary | ICD-10-CM | POA: Insufficient documentation

## 2017-11-22 ENCOUNTER — Ambulatory Visit (HOSPITAL_COMMUNITY): Admission: RE | Admit: 2017-11-22 | Payer: Medicare Other | Source: Ambulatory Visit

## 2017-11-22 ENCOUNTER — Ambulatory Visit (HOSPITAL_COMMUNITY)
Admission: RE | Admit: 2017-11-22 | Discharge: 2017-11-22 | Disposition: A | Discharge status: Home / Self Care | Payer: Medicare Other | Source: Ambulatory Visit | Attending: Cardiovascular Disease | Admitting: Cardiovascular Disease

## 2017-11-22 DIAGNOSIS — I712 Thoracic aortic aneurysm, without rupture: Secondary | ICD-10-CM | POA: Insufficient documentation

## 2017-11-22 DIAGNOSIS — Q231 Congenital insufficiency of aortic valve: Secondary | ICD-10-CM | POA: Insufficient documentation

## 2017-11-22 DIAGNOSIS — I350 Nonrheumatic aortic (valve) stenosis: Secondary | ICD-10-CM

## 2017-11-22 DIAGNOSIS — Q2381 Bicuspid aortic valve: Secondary | ICD-10-CM

## 2017-11-22 MED ORDER — METOPROLOL TARTRATE 5 MG/5ML IV SOLN
INTRAVENOUS | Status: AC
  Administered 2017-11-22: 5 mg via INTRAVENOUS
  Filled 2017-11-22: qty 5

## 2017-11-22 MED ORDER — METOPROLOL TARTRATE 5 MG/5ML IV SOLN
5.0000 mg | Freq: Once | INTRAVENOUS | Status: AC
  Administered 2017-11-22: 5 mg via INTRAVENOUS

## 2017-11-22 MED ORDER — NITROGLYCERIN 0.4 MG SL SUBL
SUBLINGUAL_TABLET | SUBLINGUAL | Status: AC
  Filled 2017-11-22: qty 2

## 2017-11-22 MED ORDER — NITROGLYCERIN 0.4 MG SL SUBL
0.8000 mg | SUBLINGUAL_TABLET | Freq: Once | SUBLINGUAL | Status: AC
  Administered 2017-11-22: 0.8 mg via SUBLINGUAL

## 2017-11-22 MED ORDER — IOPAMIDOL (ISOVUE-370) INJECTION 76%
INTRAVENOUS | Status: AC
  Administered 2017-11-22: 80 mL
  Filled 2017-11-22: qty 100

## 2017-11-23 ENCOUNTER — Ambulatory Visit: Payer: Medicare Other | Admitting: Physical Therapy

## 2017-11-23 ENCOUNTER — Encounter: Payer: Self-pay | Admitting: Physical Therapy

## 2017-11-23 ENCOUNTER — Telehealth: Payer: Self-pay | Admitting: Physical Therapy

## 2017-11-23 NOTE — Telephone Encounter (Signed)
Patient contacted regarding missed visit. Therapy left message. Patients next visit is on 12/10 at 8:45.

## 2017-11-26 ENCOUNTER — Ambulatory Visit: Payer: Medicare Other | Admitting: Physical Therapy

## 2017-11-28 ENCOUNTER — Ambulatory Visit: Payer: Medicare Other | Admitting: Physical Therapy

## 2017-11-29 ENCOUNTER — Telehealth (INDEPENDENT_AMBULATORY_CARE_PROVIDER_SITE_OTHER): Payer: Self-pay | Admitting: Orthopedic Surgery

## 2017-11-29 ENCOUNTER — Ambulatory Visit: Payer: Medicare Other | Admitting: Physical Therapy

## 2017-11-29 NOTE — Telephone Encounter (Signed)
FYI patient says divot is getting really bad, so could you call him if an appointment opens before 12/21? Thanks!

## 2017-11-30 ENCOUNTER — Encounter: Admitting: Physical Therapy

## 2017-12-03 ENCOUNTER — Encounter (INDEPENDENT_AMBULATORY_CARE_PROVIDER_SITE_OTHER): Payer: Self-pay | Admitting: Orthopedic Surgery

## 2017-12-03 ENCOUNTER — Ambulatory Visit (INDEPENDENT_AMBULATORY_CARE_PROVIDER_SITE_OTHER): Payer: Medicare Other | Admitting: Orthopedic Surgery

## 2017-12-03 ENCOUNTER — Encounter: Admitting: Physical Therapy

## 2017-12-03 DIAGNOSIS — M25562 Pain in left knee: Secondary | ICD-10-CM

## 2017-12-05 ENCOUNTER — Encounter: Admitting: Physical Therapy

## 2017-12-05 DIAGNOSIS — F438 Other reactions to severe stress: Secondary | ICD-10-CM | POA: Diagnosis not present

## 2017-12-06 ENCOUNTER — Encounter: Payer: Self-pay | Admitting: Physical Therapy

## 2017-12-06 ENCOUNTER — Ambulatory Visit: Payer: Medicare Other | Admitting: Physical Therapy

## 2017-12-06 DIAGNOSIS — R2689 Other abnormalities of gait and mobility: Secondary | ICD-10-CM | POA: Diagnosis not present

## 2017-12-06 DIAGNOSIS — M25561 Pain in right knee: Secondary | ICD-10-CM

## 2017-12-06 DIAGNOSIS — M25562 Pain in left knee: Secondary | ICD-10-CM

## 2017-12-06 NOTE — Therapy (Addendum)
[place], [place], [address] Phone: [phone]   Fax:  [phone]  Physical Therapy Treatment/ discharge   Patient Details  Name: Jose [name] MRN: [id number] Date of Birth: 10-Jan-1951 Referring Provider: Dr Meredith Pel    Encounter Date: 12/06/2017  PT End of Session - 12/06/17 0947    Visit Number  27    Number of Visits  34    Date for PT Re-Evaluation  12/17/17    PT Start Time  0935 Patient 5 min later or not checked in     PT Stop Time  1025    PT Time Calculation (min)  50 min    Activity Tolerance  Patient tolerated treatment well    Behavior During Therapy  [facility] for tasks assessed/performed       Past Medical History:  Diagnosis Date  . Arthritis    R shoulder, great toes, hands- has gout & has injections in knees with Dr. Estanislado Pandy   . Cancer ([place])    skin ca-   . GERD (gastroesophageal reflux disease)   . History of hiatal hernia   . SDH (subdural hematoma) (HCC)     Past Surgical History:  Procedure Laterality Date  . BRAIN SURGERY  2016   evacuation of SDH  . CARPAL TUNNEL RELEASE Bilateral   . GREEN LIGHT LASER TURP (TRANSURETHRAL RESECTION OF PROSTATE  04/27/2017  . QUADRICEPS TENDON REPAIR Bilateral 06/15/2017   Procedure: REPAIR QUADRICEP TENDON;  Surgeon: Meredith Pel, MD;  Location: [facility];  Service: Orthopedics;  Laterality: Bilateral;  . SHOULDER SURGERY Right     There were no vitals filed for this visit.                   [facility] Adult PT Treatment/Exercise - 12/06/17 0001      Knee/Hip Exercises: Stretches   Active Hamstring Stretch Limitations  with strap 3x20 sec hold       Knee/Hip Exercises: Aerobic   Nustep  5 min Level 5       Knee/Hip Exercises: Machines for Strengthening   Cybex Knee Flexion  35. 45,, 55  X 10,  35 X both x      Knee/Hip Exercises: Supine   Bridges Limitations  2x10 with blue band     Straight Leg  Raises Limitations  2x10 2lb right leg  no weight left because of pain with weight       Cryotherapy   Number Minutes Cryotherapy  10 Minutes    Cryotherapy Location  Knee Both elevated    Type of Cryotherapy  Ice pack               PT Short Term Goals - 11/12/17 0932      PT SHORT TERM GOAL #1   Title  Patient will increase bilateral lower extremity strength to 5/5     Baseline  4+/5 left lower extremity 5/5 right     Status  Partially Met      PT SHORT TERM GOAL #2   Title  Patient will be independent with inital HEP     Baseline  independent    Time  4    Period  Weeks    Status  Achieved      PT SHORT TERM GOAL #3   Title  Patient will demsotrate 20 second single leg stance bilateral     Baseline  continues to have  PT Long Term Goals - 11/05/17 1320      PT LONG TERM GOAL #1   Title  Patient will walk 1 mile without self report of pain     Baseline  able    Time  8    Period  Weeks    Status  Achieved      PT LONG TERM GOAL #2   Title  Patient will demsotrate a 30% limitation on FOTO     Baseline  30% limitation    Time  8    Period  Weeks    Status  Achieved      PT LONG TERM GOAL #3   Title  Patient will sleep through the night without increased pain     Baseline  occasionally wakes with pain in the night    Time  8    Period  Weeks    Status  On-going      PT LONG TERM GOAL #4   Title  Patient will stand for 1 hour without increased pain in order to perfrom daily tasks    Baseline  able    Time  8    Period  Weeks    Status  Achieved            Plan - 12/06/17 1405    Clinical Impression Statement  Per MD therapy is to continue to work on strengthening for 6 more weeks. Therapy will have to be careful not to worssen the tear. The patient will be careful to let therapy know how his knee is feeling. Therapy will continue to advance exercises as tolerated. At this time the patient feels like he is having difficulty walking  outside on uneven surfaces and going up/down stairs. Therapy will continue to work on functional strengthening.     Clinical Presentation  Stable    Clinical Decision Making  Low    Rehab Potential  Good    PT Frequency  2x / week    PT Duration  8 weeks    PT Treatment/Interventions  ADLs/Self Care Home Management;Cryotherapy;Electrical Stimulation;Iontophoresis 36m/ml Dexamethasone;Stair training;Gait training;Moist Heat;Therapeutic activities;Therapeutic exercise;Neuromuscular re-education;Patient/family education;Passive range of motion;Manual techniques;Taping;Ultrasound    PT Next Visit Plan   continue to focus on neuro-musuclar re-ed and stabilization exerises .      PT Home Exercise Plan  SLR; bridge, LAQ yellow; hamstring yellow; standing march     Consulted and Agree with Plan of Care  Patient       Patient will benefit from skilled therapeutic intervention in order to improve the following deficits and impairments:  Pain  Visit Diagnosis: Acute pain of right knee  Acute pain of left knee  Other abnormalities of gait and mobility     Problem List Patient Active Problem List   Diagnosis Date Noted  . Candidiasis   . Abnormality of gait   . Hypoalbuminemia due to protein[place]calorie malnutrition [place]   . History of gout   . Sleep disturbance   . Constipation   . Rupture of quadriceps tendon, right, sequela 06/19/2017  . Quadriceps tendon rupture, left, sequela 06/19/2017  . Acute blood loss anemia 06/19/2017  . Fall   . History of subdural hematoma   . Post-operative pain   . Recurrent falls   . Quadriceps tendon rupture 06/15/2017  . Primary osteoarthritis of both knees 02/28/2017  . Primary osteoarthritis of both hands 02/28/2017  . Uricacidemia 02/28/2017  . Pain in joint of left knee 02/07/2017  . Idiopathic  chronic gout, unspecified site, without tophus (tophi) 11/29/2016  . HEMATURIA UNSPECIFIED 01/25/2009  . ABDOMINAL PAIN 01/25/2009  . XERODERMA  03/10/2008  . UNS ADVRS EFF UNS RX MEDICINAL&BIOLOGICAL SBSTNC 03/10/2008  . ADJUSTMENT REACTION WITH PHYSICAL SYMPTOMS 02/14/2008  . MUSCLE SPASM 02/14/2008  . ACUTE PROSTATITIS 12/16/2007  . BREAST LUMP OR MASS, RIGHT 07/12/2007  . H/O: RCT (rotator cuff tear) 01/13/2007  . GOUT 01/08/2007   PHYSICAL THERAPY DISCHARGE SUMMARY  Visits from Start of Care: 27  Current functional level related to goals / functional outcomes: Continued to have weakness    Remaining deficits: None    Education / Equipment: HEP  Plan: Patient agrees to discharge.  Patient goals were not met. Patient is being discharged due to meeting the stated rehab goals.  ?????      Carney Living PT DPT 12/06/2017, 2:37 PM  [facility] [address] [place], [place], [address] Phone: [phone]   Fax:  [phone]  Name: Jose [name] MRN: [id number] Date of Birth: [birth year]

## 2017-12-07 ENCOUNTER — Other Ambulatory Visit: Payer: Self-pay | Admitting: *Deleted

## 2017-12-07 ENCOUNTER — Ambulatory Visit (INDEPENDENT_AMBULATORY_CARE_PROVIDER_SITE_OTHER): Payer: Medicare Other | Admitting: Orthopedic Surgery

## 2017-12-07 ENCOUNTER — Encounter: Admitting: Physical Therapy

## 2017-12-07 DIAGNOSIS — I712 Thoracic aortic aneurysm, without rupture, unspecified: Secondary | ICD-10-CM

## 2017-12-08 ENCOUNTER — Encounter (INDEPENDENT_AMBULATORY_CARE_PROVIDER_SITE_OTHER): Payer: Self-pay | Admitting: Orthopedic Surgery

## 2017-12-08 NOTE — Progress Notes (Signed)
Office Visit Note   Patient: Jose [name]           Date of Birth: [birth year]           MRN: [id number] Visit Date: 12/03/2017 Requested by: [name], MD [address] [place], [place] [address] PCP: [name], MD  Subjective: Chief Complaint  Patient presents with  . Left Knee - Follow-up    HPI: Several months out from bilateral quad tendon repair.  Well on the right side.  On the left side this was a repair states that the left knee is not really getting much stronger.  He has been ambulating with a cane. At the last clinic visit the patient had pain in the left knee region.  The area in the left knee which was chronically torn prior to surgery had evidence of tearing.                ROS:All systems reviewed are negative as they relate to the chief complaint within the history of present illness.  Patient denies  fevers or chills. All systems reviewed are negative as they relate to the chief complaint within the history of present illness.  Patient denies  fevers or chills.  essment & Plan: Visit Diagnoses:  1. Pain in joint of left knee     Plan: Impression is left knee pain.  I think the patient may have expanding partial re-tearing of his repaired left quad tendon.  Still has good function and hasHe still has good function and has full extension against gravity but the palpable defect above the patella is increasing in size.  I think that he will likely need some type of revision and tissue interposition surgery.  He's going to consider his options and continue with therapy or another 6 weeks.  I will see him back at that time and we can make a final decision about surgery . Follow-Up Instructions: Return in about 6 weeks (around 01/14/2018).   Orders:  No orders of the defined types were placed in this encounter.  No orders of the defined types were placed in this encounter.     Procedures: No procedures performed   Clinical Data: No  additional findings.  Objective: Vital Signs: There were no vitals taken for this visit.  Physical Exam:   Constitutional: Patient appears well-developed HEENT:  Head: Normocephalic Eyes:EOM are normal Neck: Normal range of motion Cardiovascular: Normal rate Pulmonary/chest: Effort normal Neurologic: Patient is alert Skin: Skin is warm Psychiatric: Patient has normal mood and affect    Ortho Exam: orthopedic exam demonstrates full active and passive range of motion of the right knee with good strength.  On the left knee there is a palpable defect that used to be one finger width but now is about finger and a half with above the patella.  Patient still has full active extension against gravity.  There is no warmth or effusion in the left knee.   Patient still has full active extension against gravity. Specialty Comments:  No specialty comments available.  Imaging: No results found.   PMFS History: Patient Active Problem List   Diagnosis Date Noted  . Candidiasis   . Abnormality of gait   . Hypoalbuminemia due to protein-calorie malnutrition ([place])   . History of gout   . Sleep disturbance   . Constipation   . Rupture of quadriceps tendon, right, sequela 06/19/2017  . Quadriceps tendon rupture, left, sequela 06/19/2017  . Acute blood loss anemia  06/19/2017  . Fall   . History of subdural hematoma   . Post-operative pain   . Recurrent falls   . Quadriceps tendon rupture 06/15/2017  . Primary osteoarthritis of both knees 02/28/2017  . Primary osteoarthritis of both hands 02/28/2017  . Uricacidemia 02/28/2017  . Pain in joint of left knee 02/07/2017  . Idiopathic chronic gout, unspecified site, without tophus (tophi) 11/29/2016  . HEMATURIA UNSPECIFIED 01/25/2009  . ABDOMINAL PAIN 01/25/2009  . XERODERMA 03/10/2008  . UNS ADVRS EFF UNS RX MEDICINAL&BIOLOGICAL SBSTNC 03/10/2008  . ADJUSTMENT REACTION WITH PHYSICAL SYMPTOMS 02/14/2008  . MUSCLE SPASM 02/14/2008  .  ACUTE PROSTATITIS 12/16/2007  . BREAST LUMP OR MASS, RIGHT 07/12/2007  . H/O: RCT (rotator cuff tear) 01/13/2007  . GOUT 01/08/2007   Past Medical History:  Diagnosis Date  . Arthritis    R shoulder, great toes, hands- has gout & has injections in knees with Dr. Estanislado Pandy   . Cancer ([place])    skin ca-   . GERD (gastroesophageal reflux disease)   . History of hiatal hernia   . SDH (subdural hematoma) ([facility])     Family History  Problem Relation Age of Onset  . Parkinson's disease Mother   . Heart disease Father   . Parkinson's disease Brother   . Diabetes Brother     Past Surgical History:  Procedure Laterality Date  . BRAIN SURGERY  2016   evacuation of SDH  . CARPAL TUNNEL RELEASE Bilateral   . GREEN LIGHT LASER TURP (TRANSURETHRAL RESECTION OF PROSTATE  04/27/2017  . QUADRICEPS TENDON REPAIR Bilateral 06/15/2017   Procedure: REPAIR QUADRICEP TENDON;  Surgeon: Meredith Pel, MD;  Location: [facility];  Service: Orthopedics;  Laterality: Bilateral;  . SHOULDER SURGERY Right    Social History   Occupational History  . Not on file  Tobacco Use  . Smoking status: Current Every Day Smoker    Types: Cigars  . Smokeless tobacco: Never Used  . Tobacco comment: celebration use   Substance and Sexual Activity  . Alcohol use: Yes    Alcohol/week: 1.8 oz    Types: 2 Glasses of wine, 1 Shots of liquor per week    Comment: 2 drinks per day  . Drug use: No  . Sexual activity: Not on file

## 2017-12-10 ENCOUNTER — Encounter: Admitting: Physical Therapy

## 2017-12-12 ENCOUNTER — Encounter: Admitting: Physical Therapy

## 2017-12-12 DIAGNOSIS — F438 Other reactions to severe stress: Secondary | ICD-10-CM | POA: Diagnosis not present

## 2017-12-14 ENCOUNTER — Encounter: Admitting: Physical Therapy

## 2017-12-17 ENCOUNTER — Encounter: Admitting: Physical Therapy

## 2017-12-19 DIAGNOSIS — F438 Other reactions to severe stress: Secondary | ICD-10-CM | POA: Diagnosis not present

## 2017-12-20 ENCOUNTER — Telehealth (INDEPENDENT_AMBULATORY_CARE_PROVIDER_SITE_OTHER): Payer: Self-pay | Admitting: Orthopedic Surgery

## 2017-12-20 NOTE — Telephone Encounter (Signed)
12/03/2017 OV note faxed to [facility] [phone]

## 2017-12-22 ENCOUNTER — Other Ambulatory Visit (INDEPENDENT_AMBULATORY_CARE_PROVIDER_SITE_OTHER): Payer: Self-pay | Admitting: Orthopedic Surgery

## 2017-12-23 ENCOUNTER — Other Ambulatory Visit (INDEPENDENT_AMBULATORY_CARE_PROVIDER_SITE_OTHER): Payer: Self-pay | Admitting: Orthopedic Surgery

## 2017-12-24 NOTE — Telephone Encounter (Signed)
y

## 2017-12-24 NOTE — Telephone Encounter (Signed)
Duplicate. Waiting to be advised by Dr Marlou Sa.

## 2017-12-24 NOTE — Telephone Encounter (Signed)
Ok to rf? 

## 2017-12-26 DIAGNOSIS — F438 Other reactions to severe stress: Secondary | ICD-10-CM | POA: Diagnosis not present

## 2018-01-02 DIAGNOSIS — F438 Other reactions to severe stress: Secondary | ICD-10-CM | POA: Diagnosis not present

## 2018-01-09 DIAGNOSIS — F438 Other reactions to severe stress: Secondary | ICD-10-CM | POA: Diagnosis not present

## 2018-01-16 ENCOUNTER — Encounter (INDEPENDENT_AMBULATORY_CARE_PROVIDER_SITE_OTHER): Payer: Self-pay | Admitting: Orthopedic Surgery

## 2018-01-16 ENCOUNTER — Ambulatory Visit (INDEPENDENT_AMBULATORY_CARE_PROVIDER_SITE_OTHER): Payer: Medicare Other | Admitting: Orthopedic Surgery

## 2018-01-16 DIAGNOSIS — F438 Other reactions to severe stress: Secondary | ICD-10-CM | POA: Diagnosis not present

## 2018-01-16 DIAGNOSIS — M25562 Pain in left knee: Secondary | ICD-10-CM | POA: Diagnosis not present

## 2018-01-17 ENCOUNTER — Encounter: Payer: Self-pay | Admitting: Cardiothoracic Surgery

## 2018-01-17 ENCOUNTER — Ambulatory Visit (INDEPENDENT_AMBULATORY_CARE_PROVIDER_SITE_OTHER): Payer: Medicare Other | Admitting: Cardiothoracic Surgery

## 2018-01-17 ENCOUNTER — Ambulatory Visit
Admission: RE | Admit: 2018-01-17 | Discharge: 2018-01-17 | Disposition: A | Discharge status: Home / Self Care | Payer: Medicare Other | Source: Ambulatory Visit | Attending: Cardiothoracic Surgery | Admitting: Cardiothoracic Surgery

## 2018-01-17 VITALS — BP 160/94 | HR 60 | Resp 20 | Ht 72.0 in | Wt 195.0 lb

## 2018-01-17 DIAGNOSIS — I712 Thoracic aortic aneurysm, without rupture, unspecified: Secondary | ICD-10-CM

## 2018-01-17 DIAGNOSIS — Q231 Congenital insufficiency of aortic valve: Secondary | ICD-10-CM

## 2018-01-17 MED ORDER — IOPAMIDOL (ISOVUE-370) INJECTION 76%
75.0000 mL | Freq: Once | INTRAVENOUS | Status: AC | PRN
  Administered 2018-01-17: 75 mL via INTRAVENOUS

## 2018-01-17 NOTE — Patient Instructions (Signed)

## 2018-01-17 NOTE — Progress Notes (Signed)
[address]       [place] [address]             [phone]                    [name] Medical Record #[id number] Date of Birth: 1951/01/30  Referring: Lacretia Leigh, MD Primary Care: [name], MD  Chief Complaint:    Chief Complaint  Patient presents with  . Thoracic Aortic Aneurysm    1 year f/u with CTA Chest    History of Present Illness:    Jose [name] 67 y.o. male was first seen in the office 07/06/2015  referred by the emergency room at [facility]  because of a dilated ascending aorta. The patient has no previous history of cardiac disease, no history of known coronary artery disease or valvular disease.  The patient has a history of intracranial bleed/ subdural hematoma without history of trauma treated in [place].   He underwent right craniotomy.  In July 2016 he was having increasing cough discussed this with his neurologist who recommended that he be seen and have a CAT scan of the head done. While in the emergency room a CT scan of the chest was also done., Demonstrating mild dilatation of the ascending aorta. The patient is a nonsmoker.  He denies any symptoms of angina, echocardiograms confirmed a bicuspid aortic valve   History family history is significant for his father who died in his late 42s with myocardial infarction, after having his first myocardial infarction in his 98s. There is no family history of aortic aneurysms, unexplained sudden death at an early age or aortic dissection.He has one cousin who died in his 24s of sudden collapse thought to be a brain aneurysm   Since last seen , patient had repair of bilateral ruptured quadricep tendons, right right leg is now working well still having problems with the left leg and requires wearing a brace.   Current Activity/ Functional Status:  Patient is independent with mobility/ambulation, transfers, ADL's, IADL's.   Zubrod Score: At the time of surgery this  patient's most appropriate activity status/level should be described as: []     0    Normal activity, no symptoms [x]     1    Restricted in physical strenuous activity but ambulatory, able to do out light work []     2    Ambulatory and capable of self care, unable to do work activities, up and about               >50 % of waking hours                              []     3    Only limited self care, in bed greater than 50% of waking hours []     4    Completely disabled, no self care, confined to bed or chair []     5    Moribund   Past Medical History:  Diagnosis Date  . Arthritis    R shoulder, great toes, hands- has gout & has injections in knees with Dr. Estanislado Pandy   . Cancer ([facility])    skin ca-   . GERD (gastroesophageal reflux disease)   . History of hiatal hernia   . SDH (subdural hematoma) (HCC)     Past Surgical History:  Procedure Laterality Date  .  BRAIN SURGERY  2016   evacuation of SDH  . CARPAL TUNNEL RELEASE Bilateral   . GREEN LIGHT LASER TURP (TRANSURETHRAL RESECTION OF PROSTATE  04/27/2017  . QUADRICEPS TENDON REPAIR Bilateral 06/15/2017   Procedure: REPAIR QUADRICEP TENDON;  Surgeon: Meredith Pel, MD;  Location: [facility];  Service: Orthopedics;  Laterality: Bilateral;  . SHOULDER SURGERY Right     FAmily history: Both of the patient's parents are deceased his father died at age 26 of myocardial infarction previously in his early 60s he had 2 myocardial infarctions. His mother died with Parkinson's at age 80. He has a living brother and sister with history of diabetes Parkinson's and depression, he has one son age 82. He has one cousin who died in his 51s of sudden collapse thought to be a brain aneurysm.   History   Social History  . Marital Status: Married    Spouse Name: N/A  . Number of Children: N/A  . Years of Education: N/A   Occupational History  Patient works as a Optometrist in the [employer]  . Smoking status:  Never Smoker   . Smokeless tobacco: Not on file  . Alcohol Use: No  . Drug Use: No  . Sexual Activity: Not on file     Social History   Tobacco Use  Smoking Status Current Every Day Smoker  . Types: Cigars  Smokeless Tobacco Never Used  Tobacco Comment   celebration use     Social History   Substance and Sexual Activity  Alcohol Use Yes  . Alcohol/week: 1.8 oz  . Types: 2 Glasses of wine, 1 Shots of liquor per week   Comment: 2 drinks per day     No Known Allergies  Current Outpatient Medications  Medication Sig Dispense Refill  . acetaminophen (TYLENOL) 500 MG tablet Take 500-1,000 mg by mouth every 6 (six) hours as needed for mild pain.    . baclofen (LIORESAL) 10 MG tablet TAKE 1 TABLET BY MOUTH TWICE A DAY 60 tablet 1  . colchicine 0.6 MG tablet Take 1 tablet (0.6 mg total) by mouth daily as needed (FOR GOUT FLAREUPS). 30 tablet 3  . esomeprazole (NEXIUM) 40 MG capsule Take 1 capsule (40 mg total) by mouth daily. 30 capsule 0  . metoprolol succinate (TOPROL-XL) 25 MG 24 hr tablet TAKE 1 TABLET (25 MG TOTAL) BY MOUTH DAILY. TAKE WITH OR IMMEDIATELY FOLLOWING A MEAL.  3  . metoprolol tartrate (LOPRESSOR) 25 MG tablet Take 25 mg by mouth once.    . Multiple Vitamin (MULTIVITAMIN WITH MINERALS) TABS tablet Take 1 tablet by mouth daily.    . tamsulosin (FLOMAX) 0.4 MG CAPS capsule Take by mouth.    . VOLTAREN 1 % GEL APPLY 4 G TOPICALLY 4 (FOUR) TIMES DAILY. 300 g 1   No current facility-administered medications for this visit.       Review of Systems:  Review of Systems  Constitutional: Negative for chills, diaphoresis, fever, malaise/fatigue and weight loss.  HENT: Negative.   Eyes: Negative.   Respiratory: Negative.   Cardiovascular: Negative.   Gastrointestinal: Negative.   Genitourinary: Positive for dysuria. Negative for flank pain, frequency, hematuria and urgency.  Musculoskeletal: Positive for falls and joint pain.  Skin: Negative.   Neurological:  Positive for weakness.  Endo/Heme/Allergies: Negative.   Psychiatric/Behavioral: Negative.     Immunizations: Flu up to date [n  ]; Pneumococcal up to date [ n ];   Physical Exam:  BP (!) 160/94   Pulse 60   Resp 20   Ht 6' (1.829 m)   Wt 195 lb (88.5 kg)   SpO2 95% Comment: RA  BMI 26.45 kg/m   PHYSICAL EXAMINATION: General appearance: alert and cooperative Head: Normocephalic, without obvious abnormality, atraumatic Neck: no adenopathy, no carotid bruit, no JVD, supple, symmetrical, trachea midline and thyroid not enlarged, symmetric, no tenderness/mass/nodules Lymph nodes: Cervical, supraclavicular, and axillary nodes normal. Resp: clear to auscultation bilaterally Back: symmetric, no curvature. ROM normal. No CVA tenderness. Cardio: systolic murmur: early systolic 2/6, crescendo at apex GI: soft, non-tender; bowel sounds normal; no masses,  no organomegaly Extremities: extremities normal, atraumatic, no cyanosis or edema and Homans sign is negative, no sign of DVT Neurologic: Grossly normal Patient has 2+ DP and PT pulses bilaterally Brachial pulses are equal and bilateral radial pulses are equal and bilateral  Diagnostic Studies & Laboratory data:     Recent Radiology Findings:  Ct Angio Chest Aorta W/cm &/or Wo/cm  Result Date: 01/17/2018 CLINICAL DATA:  Followup thoracic aortic aneurysm EXAM: CT ANGIOGRAPHY CHEST WITH CONTRAST TECHNIQUE: Multidetector CT imaging of the chest was performed using the standard protocol during bolus administration of intravenous contrast. Multiplanar CT image reconstructions and MIPs were obtained to evaluate the vascular anatomy. CONTRAST:  75 mL Isovue 370. Creatinine was obtained on site at [facility] at [address] Results: Creatinine 1.2 mg/dL. COMPARISON:  Coronary CT angiogram from 11/22/2017 FINDINGS: Cardiovascular: Thoracic aorta again demonstrates dilatation of the ascending aorta. It measures approximately 4.5 x 4.3  cm in greatest dimension at the level of the main pulmonary artery. It measures approximately 4.1 cm at the level of the sinus of Valsalva. The sino-tubular junction measures 3.6 cm stable from the prior exam. Bicuspid aortic valve is again noted with calcifications similar to that seen on the recent exam. No significant coronary calcifications are noted. No cardiac enlargement is noted. The pulmonary artery as visualized is within normal limits. Mediastinum/Nodes: Thoracic inlet is within normal limits. No hilar or mediastinal adenopathy is noted. The esophagus is within normal limits. Lungs/Pleura: The lungs are well aerated bilaterally without evidence of focal infiltrate or sizable effusion. Upper Abdomen: No acute abnormality is noted in the upper abdomen. Fatty infiltration of the liver is seen. Musculoskeletal: Degenerative changes of the thoracic spine are noted. No acute bony abnormality is seen. Review of the MIP images confirms the above findings. IMPRESSION: Overall stable appearance of the ascending aortic aneurysm measuring 4.5 cm in greatest dimension. Recommend semi-annual imaging followup by CTA or MRA. This recommendation follows 2010 ACCF/AHA/AATS/ACR/ASA/SCA/SCAI/SIR/STS/SVM Guidelines for the Diagnosis and Management of Patients With Thoracic Aortic Disease. Circulation. 2010; 121: N562-Z308 Bicuspid aortic valve. No acute abnormality is noted. Aortic aneurysm NOS (ICD10-I71.9). Electronically Signed   By: Inez Catalina M.D.   On: 01/17/2018 15:20       Ct Angio Chest Aorta W &/or Wo Contrast  Result Date: 01/04/2017 CLINICAL DATA:  Follow-up of ascending thoracic aortic aneurysm. EXAM: CT ANGIOGRAPHY CHEST WITH CONTRAST TECHNIQUE: Multidetector CT imaging of the chest was performed using the standard protocol during bolus administration of intravenous contrast. Multiplanar CT image reconstructions and MIPs were obtained to evaluate the vascular anatomy. CONTRAST:  75 mL of Isovue 370  COMPARISON:  January 06, 2016 FINDINGS: Cardiovascular: The thoracic aorta measures 4.3 cm in AP diameter at its root root on series 3, image 59 versus 4.3 cm previously. The thoracic aorta measures 4.5 cm at the level of the  right main pulmonary artery, unchanged. The thoracic aorta measures 4.1 cm distally, as it enters the arch on series 3, image 36 versus 4.1 cm previously. The remainder of the thoracic aorta is normal in caliber. No dissection. The vessels off of the thoracic aortic arch are stable. Central pulmonary arteries are unremarkable. The heart size is normal and unchanged. No effusions. Mediastinum/Nodes: No enlarged mediastinal, hilar, or axillary lymph nodes. Thyroid gland, trachea, and esophagus demonstrate no significant findings. Lungs/Pleura: Lungs are clear. No pleural effusion or pneumothorax. 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 There is hepatic steatosis. The upper abdomen is otherwise unremarkable. Musculoskeletal: Degenerative changes seen in the thoracic spine. No other bony abnormalities. Review of the MIP images confirms the above findings. IMPRESSION: 1. Aneurysmal dilatation of the ascending thoracic aorta measuring up to 4.5 cm, unchanged in the interval. 2. 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 3. No other acute abnormalities. Ascending thoracic aortic aneurysm. Recommend semi-annual imaging followup by CTA or MRA and referral to cardiothoracic surgery if not already obtained. This recommendation follows 2010 ACCF/AHA/AATS/ACR/ASA/SCA/SCAI/SIR/STS/SVM Guidelines for the Diagnosis and Management of Patients With Thoracic Aortic Disease. Circulation. 2010; 121: D408-X448 Electronically Signed   By: Dorise Bullion III M.D   On: 01/04/2017 14:00     I have independently reviewed the above radiology studies  and reviewed the findings  with the patient.  02/2017: Echocardiography  Patient:    Tamotsu, Wiederholt MR #:       [id number] Study Date: 02/21/2017 Gender:     M Age:        63 Height:     182.9 cm Weight:     89.4 kg BSA:        2.14 m^2 Pt. Status: Room:   Jetty Duhamel, M.D.  REFERRING    [name], M.D.  ATTENDING    Ena Dawley, M.D.  PERFORMING   [facility], Outpatient  SONOGRAPHER  [facility], RDCS  cc:  ------------------------------------------------------------------- LV EF: 50% -   55%  ------------------------------------------------------------------- Indications:      Aortic Stenosis (I35.0).  ------------------------------------------------------------------- History:   PMH:  Asthma, Aortic Aneurysm, Bicuspid Aortic Valve. Congestive heart failure.  Risk factors:  Family history of coronary artery disease. Hypertension. Diabetes mellitus.  ------------------------------------------------------------------- Study Conclusions  - Left ventricle: The cavity size was normal. Systolic function was   normal. The estimated ejection fraction was in the range of 50%   to 55%. Wall motion was normal; there were no regional wall   motion abnormalities. Doppler parameters are consistent with   abnormal left ventricular relaxation (grade 1 diastolic   dysfunction). - Aortic valve: Bicuspid; normal thickness leaflets. There was   moderate regurgitation. Mean gradient (S): 22 mm Hg. Peak   gradient (S): 35 mm Hg. Valve area (VTI): 1.66 cm^2. Valve area   (Vmax): 1.8 cm^2. Valve area (Vmean): 1.53 cm^2. - Aortic root: The aortic root was mildly dilated measuring 40 mm. - Ascending aorta: The ascending aorta was dilated measuring 45 mm. - Mitral valve: There was trivial regurgitation. - Left atrium: The atrium was mildly dilated. - Right ventricle: Systolic function was normal. - Tricuspid valve: There was no regurgitation. - Pulmonary arteries: Systolic pressure  could not be accurately   estimated. - Inferior vena cava: The vessel was normal in size.  Impressions:  - Compared to the  prior study on 02/07/2016 aortic stenosis is now   in moderate range.   LVEF appears mildly decreased at 50-55%.  ------------------------------------------------------------------- Labs, prior tests, procedures, and surgery: ECG.     Abnormal.  ------------------------------------------------------------------- Study data:  Comparison was made to the study of 02/07/2016.  Study status:  Routine.  Procedure:  Transthoracic echocardiography. Image quality was adequate.          Echocardiography.  M-mode, complete 2D, spectral Doppler, and color Doppler.  Birthdate: Patient birthdate: [birth year].  Age:  Patient is 67 yr old.  Sex: Gender: male.    BMI: 26.7 kg/m^2.  Blood pressure:     110/80 Patient status:  Outpatient.  Study date:  Study date: 02/21/2017. Study time: 01:23 PM.  Location:  [facility]  -------------------------------------------------------------------  ------------------------------------------------------------------- Left ventricle:  The cavity size was normal. Systolic function was normal. The estimated ejection fraction was in the range of 50% to 55%. Wall motion was normal; there were no regional wall motion abnormalities. Doppler parameters are consistent with abnormal left ventricular relaxation (grade 1 diastolic dysfunction). There was no evidence of elevated ventricular filling pressure by Doppler parameters.  ------------------------------------------------------------------- Aortic valve:   Bicuspid; normal thickness leaflets. Mobility was not restricted.  Doppler:  Transvalvular velocity was within the normal range. There was no stenosis. There was moderate regurgitation.    VTI ratio of LVOT to aortic valve: 0.29. Valve area (VTI): 1.66 cm^2. Indexed valve area (VTI): 0.77 cm^2/m^2. Peak velocity ratio of LVOT  to aortic valve: 0.31. Valve area (Vmax): 1.8 cm^2. Indexed valve area (Vmax): 0.84 cm^2/m^2. Mean velocity ratio of LVOT to aortic valve: 0.27. Valve area (Vmean): 1.53 cm^2. Indexed valve area (Vmean): 0.71 cm^2/m^2.    Mean gradient (S): 22 mm Hg. Peak gradient (S): 35 mm Hg.  ------------------------------------------------------------------- Aorta:  Aortic root: The aortic root was mildly dilated measuring 40 mm. Ascending aorta: The ascending aorta was dilated measuring 45 mm.  ------------------------------------------------------------------- Mitral valve:   Structurally normal valve.   Mobility was not restricted.  Doppler:  Transvalvular velocity was within the normal range. There was no evidence for stenosis. There was trivial regurgitation.  ------------------------------------------------------------------- Left atrium:  The atrium was mildly dilated.  ------------------------------------------------------------------- Right ventricle:  The cavity size was normal. Wall thickness was normal. Systolic function was normal.  ------------------------------------------------------------------- Pulmonic valve:    Doppler:  Transvalvular velocity was within the normal range. There was no evidence for stenosis.  ------------------------------------------------------------------- Tricuspid valve:   Structurally normal valve.    Doppler: Transvalvular velocity was within the normal range. There was no regurgitation.  ------------------------------------------------------------------- Pulmonary artery:   The main pulmonary artery was normal-sized. Systolic pressure could not be accurately estimated.  ------------------------------------------------------------------- Right atrium:  The atrium was normal in size.  ------------------------------------------------------------------- Pericardium:  There was no pericardial  effusion.  ------------------------------------------------------------------- Systemic veins: Inferior vena cava: The vessel was normal in size.  ------------------------------------------------------------------- Measurements   Left ventricle                           Value          Reference  LV ID, ED, PLAX chordal          (L)     41.2  mm       43 - 52  LV ID, ES, PLAX chordal                  27    mm  23 - 38  LV fx shortening, PLAX chordal           34    %        >=29  LV PW thickness, ED                      11.5  mm       ----------  IVS/LV PW ratio, ED                      0.96           <=1.3  Stroke volume, 2D                        99    ml       ----------  Stroke volume/bsa, 2D                    46    ml/m^2   ----------  LV e&', lateral                           9.68  cm/s     ----------  LV E/e&', lateral                         4.81           ----------  LV e&', medial                            4.46  cm/s     ----------  LV E/e&', medial                          10.45          ----------  LV e&', average                           7.07  cm/s     ----------  LV E/e&', average                         6.59           ----------    Ventricular septum                       Value          Reference  IVS thickness, ED                        11    mm       ----------    LVOT                                     Value          Reference  LVOT ID, S                               27    mm       ----------  LVOT area  5.73  cm^2     ----------  LVOT peak velocity, S                    86.8  cm/s     ----------  LVOT mean velocity, S                    54.5  cm/s     ----------  LVOT VTI, S                              17.3  cm       ----------    Aortic valve                             Value          Reference  Aortic valve peak velocity, S            277   cm/s     ----------  Aortic valve mean velocity, S            204   cm/s      ----------  Aortic valve VTI, S                      59.6  cm       ----------  Aortic mean gradient, S                  19    mm Hg    ----------  Aortic peak gradient, S                  31    mm Hg    ----------  VTI ratio, LVOT/AV                       0.29           ----------  Aortic valve area, VTI                   1.66  cm^2     ----------  Aortic valve area/bsa, VTI               0.77  cm^2/m^2 ----------  Velocity ratio, peak, LVOT/AV            0.31           ----------  Aortic valve area, peak velocity         1.8   cm^2     ----------  Aortic valve area/bsa, peak              0.84  cm^2/m^2 ----------  velocity  Velocity ratio, mean, LVOT/AV            0.27           ----------  Aortic valve area, mean velocity         1.53  cm^2     ----------  Aortic valve area/bsa, mean              0.71  cm^2/m^2 ----------  velocity    Aorta                                    Value  Reference  Aortic root ID, ED                       40    mm       ----------  Ascending aorta ID, A-P, S               42    mm       ----------    Left atrium                              Value          Reference  LA ID, A-P, ES                           42    mm       ----------  LA ID/bsa, A-P                           1.96  cm/m^2   <=2.2  LA volume, S                             32.9  ml       ----------  LA volume/bsa, S                         15.4  ml/m^2   ----------  LA volume, ES, 1-p A4C                   27.2  ml       ----------  LA volume/bsa, ES, 1-p A4C               12.7  ml/m^2   ----------  LA volume, ES, 1-p A2C                   33.9  ml       ----------  LA volume/bsa, ES, 1-p A2C               15.8  ml/m^2   ----------    Mitral valve                             Value          Reference  Mitral E-wave peak velocity              46.6  cm/s     ----------  Mitral A-wave peak velocity              84.4  cm/s     ----------  Mitral deceleration time         (H)     292   ms        150 - 230  Mitral E/A ratio, peak                   0.5            ----------    Pulmonary arteries                       Value          Reference  PA pressure, S, DP  20    mm Hg    <=30    Tricuspid valve                          Value          Reference  Tricuspid regurg peak velocity           205   cm/s     ----------  Tricuspid peak RV-RA gradient            17    mm Hg    ----------    Right atrium                             Value          Reference  RA ID, S-I, ES, A4C                      48.3  mm       34 - 49  RA area, ES, A4C                         15.8  cm^2     8.3 - 19.5  RA volume, ES, A/L                       38.3  ml       ----------  RA volume/bsa, ES, A/L                   17.9  ml/m^2   ----------    Right ventricle                          Value          Reference  TAPSE                                    15.5  mm       ----------  RV s&', lateral, S                        8.27  cm/s     ----------  Legend: (L)  and  (H)  mark values outside specified reference range.  ------------------------------------------------------------------- Prepared and Electronically Authenticated by  Ena Dawley, M.D. 2018-03-07T15:04:47  Recent Lab Findings: Lab Results  Component Value Date   WBC 6.9 09/04/2017   HGB 14.1 09/04/2017   HCT 43.0 09/04/2017   PLT 239 09/04/2017   GLUCOSE 90 11/12/2017   CHOL 243 (HH) 12/25/2007   TRIG 126 12/25/2007   HDL 66.4 12/25/2007   LDLDIRECT 164.4 12/25/2007   ALT 17 09/04/2017   AST 14 09/04/2017   NA 142 11/12/2017   K 4.2 11/12/2017   CL 105 11/12/2017   CREATININE 1.04 11/12/2017   BUN 23 11/12/2017   CO2 22 11/12/2017   HGBA1C 5.5 09/04/2017     Aortic Size Index=  4.5  /Body surface area is 2.12 meters squared. =2.13  < 2.75 cm/m2      4% risk per year 2.75 to 4.25          8% risk per year > 4.25 cm/m2    20% risk per year  Aortic Cross  section area/ Height ratio=  8.7    Assessment / Plan:   1/ Dilatation of the ascending aorta 4.5  without other stigmata of connective tissue disorder or family history of dilated aorta or dissection.  2/ echo documented bicuspid aortic valve with mild stenosis.  3/hypertension   Patient was warned about not using Cipro and similar antibiotics. Recent studies have raised concern that fluoroquinolone antibiotics could be associated with an increased risk of aortic aneurysm Fluoroquinolones have non-antimicrobial properties that might jeopardise the integrity of the extracellular matrix of the vascular wall In a  propensity score matched cohort study in [place], there was a 66% increased rate of aortic aneurysm or dissection associated with oral fluoroquinolone use, compared with amoxicillin use, within a 60 day risk period from start of treatment  I've cautioned the patient about competitive or strenuous weightlifting involving Valsalva, and also the need for good blood pressure control  With abnormal valve the patient was cautioned about importance of continuing with good dental care   According to the 2010 ACC/AHA guidelines, we recommend patients with thoracic aortic disease to maintain a LDL of less than 70 and a HDL of greater than 50. We recommend their blood pressure to remain less than 135/85.   Discussed with patient follow-up CTA of chest 8 months He is aware of the signs and symptoms of aortic dissection   Grace Isaac MD      [address] [place] [address] Office [phone]   Beeper [phone]  01/17/2018 3:48 PM

## 2018-01-19 ENCOUNTER — Encounter (INDEPENDENT_AMBULATORY_CARE_PROVIDER_SITE_OTHER): Payer: Self-pay | Admitting: Orthopedic Surgery

## 2018-01-19 NOTE — Progress Notes (Signed)
Office Visit Note   Patient: Jose [name]           Date of Birth: 03-11-1951           MRN: [id number] Visit Date: 01/16/2018 Requested by: [name], MD [address] [place], [place] [address] PCP: [name], MD  Subjective: Chief Complaint  Patient presents with  . Right Leg - Follow-up  . Left Leg - Follow-up    HPI: See above              ROS: See above  Assessment & Plan: Visit Diagnoses:  1. Left knee pain, unspecified chronicity     Plan: See above  Follow-Up Instructions: No Follow-up on file.   Orders:  No orders of the defined types were placed in this encounter.  No orders of the defined types were placed in this encounter.     Procedures: No procedures performed   Clinical Data: No additional findings.  Objective: Vital Signs: There were no vitals taken for this visit.  Physical Exam: See above  Ortho Exam: See above  Specialty Comments:  No specialty comments available.  Imaging: No results found.   PMFS History: Patient Active Problem List   Diagnosis Date Noted  . Candidiasis   . Abnormality of gait   . Hypoalbuminemia due to protein-calorie malnutrition ([facility])   . History of gout   . Sleep disturbance   . Constipation   . Rupture of quadriceps tendon, right, sequela 06/19/2017  . Quadriceps tendon rupture, left, sequela 06/19/2017  . Acute blood loss anemia 06/19/2017  . Fall   . History of subdural hematoma   . Post-operative pain   . Recurrent falls   . Quadriceps tendon rupture 06/15/2017  . Primary osteoarthritis of both knees 02/28/2017  . Primary osteoarthritis of both hands 02/28/2017  . Uricacidemia 02/28/2017  . Pain in joint of left knee 02/07/2017  . Idiopathic chronic gout, unspecified site, without tophus (tophi) 11/29/2016  . HEMATURIA UNSPECIFIED 01/25/2009  . ABDOMINAL PAIN 01/25/2009  . XERODERMA 03/10/2008  . UNS ADVRS EFF UNS RX MEDICINAL&BIOLOGICAL SBSTNC 03/10/2008  .  ADJUSTMENT REACTION WITH PHYSICAL SYMPTOMS 02/14/2008  . MUSCLE SPASM 02/14/2008  . ACUTE PROSTATITIS 12/16/2007  . BREAST LUMP OR MASS, RIGHT 07/12/2007  . H/O: RCT (rotator cuff tear) 01/13/2007  . GOUT 01/08/2007   Past Medical History:  Diagnosis Date  . Arthritis    R shoulder, great toes, hands- has gout & has injections in knees with Dr. Estanislado Pandy   . Cancer ([facility])    skin ca-   . GERD (gastroesophageal reflux disease)   . History of hiatal hernia   . SDH (subdural hematoma) ([facility])     Family History  Problem Relation Age of Onset  . Parkinson's disease Mother   . Heart disease Father   . Parkinson's disease Brother   . Diabetes Brother     Past Surgical History:  Procedure Laterality Date  . BRAIN SURGERY  2016   evacuation of SDH  . CARPAL TUNNEL RELEASE Bilateral   . GREEN LIGHT LASER TURP (TRANSURETHRAL RESECTION OF PROSTATE  04/27/2017  . QUADRICEPS TENDON REPAIR Bilateral 06/15/2017   Procedure: REPAIR QUADRICEP TENDON;  Surgeon: Meredith Pel, MD;  Location: [facility];  Service: Orthopedics;  Laterality: Bilateral;  . SHOULDER SURGERY Right    Social History   Occupational History  . Not on file  Tobacco Use  . Smoking status: Current Every Day Smoker  Types: Cigars  . Smokeless tobacco: Never Used  . Tobacco comment: celebration use   Substance and Sexual Activity  . Alcohol use: Yes    Alcohol/week: 1.8 oz    Types: 2 Glasses of wine, 1 Shots of liquor per week    Comment: 2 drinks per day  . Drug use: No  . Sexual activity: Not on file

## 2018-01-19 NOTE — Progress Notes (Signed)
Office Visit Note   Patient: Jose [name]           Date of Birth: October 12, 1951           MRN: [id number] Visit Date: 01/16/2018 Requested by: Jose Spurling, MD [address] [place], [place] [address] PCP: Jose Spurling, MD  Subjective: Chief Complaint  Patient presents with  . Right Leg - Follow-up  . Left Leg - Follow-up    HPI: Jose [name] is a patient who underwent bilateral quad tendon repair 618.  The brace on the left leg.  He has had a partial disruption of his quad repair on the left-hand side.  This was a chronic tear which was partial which tore completely back in June his right leg is doing well.  For not to have further intervention at this particular time but could consider further intervention on the left leg in the future if needed.              ROS: All systems reviewed are negative as they relate to the chief complaint within the history of present illness.  Patient denies  fevers or chills.   Assessment & Plan: Visit Diagnoses:  1. 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  Gradually getting a little bit worse but he does have full extension with only a 5 degree extension lag.  Re-repair could be considered with autograft hamstring tendon.  He will consider that option.  For now he is functional.  I will see him back in 6 weeks for determination.  Follow-Up Instructions: Return in about 6 weeks (around 02/27/2018).   Orders:  No orders of the defined types were placed in this encounter.  No orders of the defined types were placed in this encounter.     Procedures: No procedures performed   Clinical Data: No additional findings.  Objective: Vital Signs: There were no vitals taken for this visit.  Physical Exam:   Constitutional: Patient appears well-developed HEENT:  Head: Normocephalic Eyes:EOM are normal Neck: Normal range of motion Cardiovascular: Normal  rate Pulmonary/chest: Effort normal Neurologic: Patient is alert Skin: Skin is warm Psychiatric: Patient has normal mood and affect    Ortho Exam: Orthopedic exam demonstrates full extension on the right with no extensor lag.  Examination on the left demonstrates palpable defect lateral aspect of the left proximal patella.  I does have extension to within 5 degrees of full extension on the left-hand side.  Flexion is easily past 90.  There is no effusion in the knee.  The medially and laterally the retinaculum is intact.  Specialty Comments:  No specialty comments available.  Imaging: No results found.   PMFS History: Patient Active Problem List   Diagnosis Date Noted  . Candidiasis   . Abnormality of gait   . Hypoalbuminemia due to protein-calorie malnutrition ([facility])   . History of gout   . Sleep disturbance   . Constipation   . Rupture of quadriceps tendon, right, sequela 06/19/2017  . Quadriceps tendon rupture, left, sequela 06/19/2017  . Acute blood loss anemia 06/19/2017  . Fall   . History of subdural hematoma   . Post-operative pain   . Recurrent falls   . Quadriceps tendon rupture 06/15/2017  . Primary osteoarthritis of both knees 02/28/2017  . Primary osteoarthritis of both hands 02/28/2017  . Uricacidemia 02/28/2017  . Pain in joint of left knee 02/07/2017  . Idiopathic  chronic gout, unspecified site, without tophus (tophi) 11/29/2016  . HEMATURIA UNSPECIFIED 01/25/2009  . ABDOMINAL PAIN 01/25/2009  . XERODERMA 03/10/2008  . UNS ADVRS EFF UNS RX MEDICINAL&BIOLOGICAL SBSTNC 03/10/2008  . ADJUSTMENT REACTION WITH PHYSICAL SYMPTOMS 02/14/2008  . MUSCLE SPASM 02/14/2008  . ACUTE PROSTATITIS 12/16/2007  . BREAST LUMP OR MASS, RIGHT 07/12/2007  . H/O: RCT (rotator cuff tear) 01/13/2007  . GOUT 01/08/2007   Past Medical History:  Diagnosis Date  . Arthritis    R shoulder, great toes, hands- has gout & has injections in knees with Dr. Estanislado [name]   . Cancer ([facility])     skin ca-   . GERD (gastroesophageal reflux disease)   . History of hiatal hernia   . SDH (subdural hematoma) ([facility])     Family History  Problem Relation Age of Onset  . Parkinson's disease Mother   . Heart disease Father   . Parkinson's disease Brother   . Diabetes Brother     Past Surgical History:  Procedure Laterality Date  . BRAIN SURGERY  2016   evacuation of SDH  . CARPAL TUNNEL RELEASE Bilateral   . GREEN LIGHT LASER TURP (TRANSURETHRAL RESECTION OF PROSTATE  04/27/2017  . QUADRICEPS TENDON REPAIR Bilateral 06/15/2017   Procedure: REPAIR QUADRICEP TENDON;  Surgeon: Jose Pel, MD;  Location: [facility];  Service: Orthopedics;  Laterality: Bilateral;  . SHOULDER SURGERY Right    Social History   Occupational History  . Not on file  Tobacco Use  . Smoking status: Current Every Day Smoker    Types: Cigars  . Smokeless tobacco: Never Used  . Tobacco comment: celebration use   Substance and Sexual Activity  . Alcohol use: Yes    Alcohol/week: 1.8 oz    Types: 2 Glasses of wine, 1 Shots of liquor per week    Comment: 2 drinks per day  . Drug use: No  . Sexual activity: Not on file

## 2018-01-28 DIAGNOSIS — R7303 Prediabetes: Secondary | ICD-10-CM | POA: Diagnosis not present

## 2018-01-28 DIAGNOSIS — Z23 Encounter for immunization: Secondary | ICD-10-CM | POA: Diagnosis not present

## 2018-01-28 DIAGNOSIS — N400 Enlarged prostate without lower urinary tract symptoms: Secondary | ICD-10-CM | POA: Diagnosis not present

## 2018-01-28 DIAGNOSIS — E781 Pure hyperglyceridemia: Secondary | ICD-10-CM | POA: Diagnosis not present

## 2018-01-28 DIAGNOSIS — M109 Gout, unspecified: Secondary | ICD-10-CM | POA: Diagnosis not present

## 2018-01-28 DIAGNOSIS — K219 Gastro-esophageal reflux disease without esophagitis: Secondary | ICD-10-CM | POA: Diagnosis not present

## 2018-01-28 DIAGNOSIS — Z0000 Encounter for general adult medical examination without abnormal findings: Secondary | ICD-10-CM | POA: Diagnosis not present

## 2018-01-31 DIAGNOSIS — F438 Other reactions to severe stress: Secondary | ICD-10-CM | POA: Diagnosis not present

## 2018-02-04 ENCOUNTER — Ambulatory Visit: Payer: Medicare Other | Admitting: Rheumatology

## 2018-02-11 ENCOUNTER — Emergency Department (HOSPITAL_COMMUNITY): Payer: Medicare Other

## 2018-02-11 ENCOUNTER — Other Ambulatory Visit: Payer: Self-pay

## 2018-02-11 ENCOUNTER — Emergency Department (HOSPITAL_COMMUNITY)
Admission: EM | Admit: 2018-02-11 | Discharge: 2018-02-11 | Disposition: A | Discharge status: Home / Self Care | Payer: Medicare Other | Attending: Emergency Medicine | Admitting: Emergency Medicine

## 2018-02-11 ENCOUNTER — Telehealth (INDEPENDENT_AMBULATORY_CARE_PROVIDER_SITE_OTHER): Payer: Self-pay | Admitting: Orthopedic Surgery

## 2018-02-11 ENCOUNTER — Encounter (HOSPITAL_COMMUNITY): Payer: Self-pay | Admitting: *Deleted

## 2018-02-11 DIAGNOSIS — Z79899 Other long term (current) drug therapy: Secondary | ICD-10-CM | POA: Insufficient documentation

## 2018-02-11 DIAGNOSIS — M25551 Pain in right hip: Secondary | ICD-10-CM | POA: Insufficient documentation

## 2018-02-11 DIAGNOSIS — F17290 Nicotine dependence, other tobacco product, uncomplicated: Secondary | ICD-10-CM | POA: Diagnosis not present

## 2018-02-11 MED ORDER — OXYCODONE-ACETAMINOPHEN 5-325 MG PO TABS
1.0000 | ORAL_TABLET | ORAL | Status: AC | PRN
  Administered 2018-02-11 (×2): 1 via ORAL
  Filled 2018-02-11 (×2): qty 1

## 2018-02-11 MED ORDER — TRAMADOL HCL 50 MG PO TABS: 50.0000 mg | ORAL_TABLET | Freq: Four times a day (QID) | ORAL | 0 refills | Status: DC | PRN

## 2018-02-11 MED ORDER — KETOROLAC TROMETHAMINE 60 MG/2ML IM SOLN
60.0000 mg | Freq: Once | INTRAMUSCULAR | Status: AC
  Administered 2018-02-11: 60 mg via INTRAMUSCULAR
  Filled 2018-02-11: qty 2

## 2018-02-11 MED ORDER — NAPROXEN 500 MG PO TABS: 500.0000 mg | ORAL_TABLET | Freq: Two times a day (BID) | ORAL | 0 refills | Status: DC

## 2018-02-11 NOTE — ED Notes (Signed)
Patient transported to xray via wheelchair.

## 2018-02-11 NOTE — Discharge Instructions (Signed)
X-ray reveals arthritis in your right hip.  Please take Aleve 500 mg twice a day for pain.  Go to your appointment tomorrow with the orthopedic doctor for further evaluation and workup.  Your blood pressure was elevated in the ER today, please have this rechecked with your primary doctor.  Return to the emergency department if you can no longer walk due to pain, have numbness in which you can no longer feel your foot or legs or have any new or worsening symptoms.

## 2018-02-11 NOTE — ED Provider Notes (Signed)
[facility] EMERGENCY DEPARTMENT Provider Note   CSN: [id number] Arrival date & time: 02/11/18  1658     History   Chief Complaint Chief Complaint  Patient presents with  . Hip Pain    HPI Jose [name] is a 67 y.o. male.  HPI  Jose [name] is a 67yo male with a history of gout, bilateral quadriceps tendon rupture who presents to the emergency department for evaluation of right hip pain.  Patient states that his pain began four days ago.  Denies inciting injury.  States that pain initially was moderate, but over time has worsened.  He states his pain is 8/10 in severity and located over the right hip.  Pain radiates down the right leg at times.  The pain is worsened with laying on the right side or with ambulation.  He has not taken any over-the-counter medications for his symptoms.  He denies numbness, weakness, fever, chills, abdominal pain, dysuria, urinary frequency, hematuria.  He is able to ambulate independently despite pain.  States that he has an appointment with his orthopedic doctor tomorrow for further evaluation.  Past Medical History:  Diagnosis Date  . Arthritis    R shoulder, great toes, hands- has gout & has injections in knees with Dr. Estanislado Pandy   . Cancer ([facility])    skin ca-   . GERD (gastroesophageal reflux disease)   . History of hiatal hernia   . SDH (subdural hematoma) [facility])     Patient Active Problem List   Diagnosis Date Noted  . Candidiasis   . Abnormality of gait   . Hypoalbuminemia due to protein-calorie malnutrition ([facility])   . History of gout   . Sleep disturbance   . Constipation   . Rupture of quadriceps tendon, right, sequela 06/19/2017  . Quadriceps tendon rupture, left, sequela 06/19/2017  . Acute blood loss anemia 06/19/2017  . Fall   . History of subdural hematoma   . Post-operative pain   . Recurrent falls   . Quadriceps tendon rupture 06/15/2017  . Primary osteoarthritis of both knees 02/28/2017  . Primary  osteoarthritis of both hands 02/28/2017  . Uricacidemia 02/28/2017  . Pain in joint of left knee 02/07/2017  . Idiopathic chronic gout, unspecified site, without tophus (tophi) 11/29/2016  . HEMATURIA UNSPECIFIED 01/25/2009  . ABDOMINAL PAIN 01/25/2009  . XERODERMA 03/10/2008  . UNS ADVRS EFF UNS RX MEDICINAL&BIOLOGICAL SBSTNC 03/10/2008  . ADJUSTMENT REACTION WITH PHYSICAL SYMPTOMS 02/14/2008  . MUSCLE SPASM 02/14/2008  . ACUTE PROSTATITIS 12/16/2007  . BREAST LUMP OR MASS, RIGHT 07/12/2007  . H/O: RCT (rotator cuff tear) 01/13/2007  . GOUT 01/08/2007    Past Surgical History:  Procedure Laterality Date  . BRAIN SURGERY  2016   evacuation of SDH  . CARPAL TUNNEL RELEASE Bilateral   . GREEN LIGHT LASER TURP (TRANSURETHRAL RESECTION OF PROSTATE  04/27/2017  . QUADRICEPS TENDON REPAIR Bilateral 06/15/2017   Procedure: REPAIR QUADRICEP TENDON;  Surgeon: Meredith Pel, MD;  Location: [facility];  Service: Orthopedics;  Laterality: Bilateral;  . SHOULDER SURGERY Right        Home Medications    Prior to Admission medications   Medication Sig Start Date End Date Taking? Authorizing Provider  acetaminophen (TYLENOL) 500 MG tablet Take 500-1,000 mg by mouth every 6 (six) hours as needed for mild pain.    [provider]  baclofen (LIORESAL) 10 MG tablet TAKE 1 TABLET BY MOUTH TWICE A DAY 12/24/17   Meredith Pel, MD  colchicine 0.6 MG tablet Take 1 tablet (0.6 mg total) by mouth daily as needed (FOR GOUT FLAREUPS). 12/01/16   Panwala, Naitik, PA-C  esomeprazole (NEXIUM) 40 MG capsule Take 1 capsule (40 mg total) by mouth daily. 04/12/17   Robyn Haber, MD  metoprolol succinate (TOPROL-XL) 25 MG 24 hr tablet TAKE 1 TABLET (25 MG TOTAL) BY MOUTH DAILY. TAKE WITH OR IMMEDIATELY FOLLOWING A MEAL. 12/08/17   [provider]  metoprolol tartrate (LOPRESSOR) 25 MG tablet Take 25 mg by mouth once.    [provider]  Multiple Vitamin (MULTIVITAMIN WITH  MINERALS) TABS tablet Take 1 tablet by mouth daily.    [provider]  tamsulosin (FLOMAX) 0.4 MG CAPS capsule Take by mouth.    [provider]  VOLTAREN 1 % GEL APPLY 4 G TOPICALLY 4 (FOUR) TIMES DAILY. 11/15/17   Meredith Pel, MD    Family History Family History  Problem Relation Age of Onset  . Parkinson's disease Mother   . Heart disease Father   . Parkinson's disease Brother   . Diabetes Brother     Social History Social History   Tobacco Use  . Smoking status: Current Every Day Smoker    Types: Cigars  . Smokeless tobacco: Never Used  . Tobacco comment: celebration use   Substance Use Topics  . Alcohol use: Yes    Alcohol/week: 1.8 oz    Types: 2 Glasses of wine, 1 Shots of liquor per week    Comment: 2 drinks per day  . Drug use: No     Allergies   Patient has no known allergies.   Review of Systems Review of Systems  Constitutional: Negative for chills and fever.  Gastrointestinal: Negative for abdominal pain, nausea and vomiting.  Genitourinary: Negative for difficulty urinating, dysuria, frequency and hematuria.  Musculoskeletal: Positive for arthralgias (right hip). Negative for gait problem.  Skin: Negative for color change, rash and wound.  Neurological: Negative for weakness and numbness.     Physical Exam Updated Vital Signs BP (!) 141/109   Pulse (!) 52   Temp 97.6 F (36.4 C) (Oral)   Resp 17   Ht 6' (1.829 m)   Wt 87.5 kg (193 lb)   SpO2 99%   BMI 26.18 kg/m   Physical Exam  Constitutional: He is oriented to person, place, and time. He appears well-developed and well-nourished. No distress.  HENT:  Head: Normocephalic and atraumatic.  Eyes: Right eye exhibits no discharge. Left eye exhibits no discharge.  Pulmonary/Chest: Effort normal. No respiratory distress.  Abdominal: Soft. Bowel sounds are normal. There is no tenderness. There is no guarding.  Musculoskeletal:  Right hip non-tender to palpation. No  erythema, warmth, induration overlying the joint. No overlying rash. Full active flexion and extension of the hip joint. Painful log roll of the right hip. Strength 5/5 in bilateral knee flexion/extension. DP pulses 2+ bilaterally.   Neurological: He is alert and oriented to person, place, and time. Coordination normal.  Distal sensation to light touch intact in bilateral lower extremities.  Patellar reflex 2+ bilaterally.  Gait normal in coronation and balance.  Skin: Skin is warm and dry. He is not diaphoretic.  Psychiatric: He has a normal mood and affect. His behavior is normal.  Nursing note and vitals reviewed.    ED Treatments / Results  Labs (all labs ordered are listed, but only abnormal results are displayed) Labs Reviewed - No data to display  EKG  EKG Interpretation None  Radiology Dg Hip Unilat W Or Wo Pelvis 2-3 Views Right  Result Date: 02/11/2018 CLINICAL DATA:  67 year old male with right hip pain for the past 4 days. EXAM: DG HIP (WITH OR WITHOUT PELVIS) 2-3V RIGHT COMPARISON:  Pelvic radiograph dated 06/15/2017. FINDINGS: There is no acute fracture or dislocation. Mild arthritic changes of the hips. The soft tissues appear unremarkable. IMPRESSION: 1. No acute fracture or dislocation. 2. Mild arthritic changes. Electronically Signed   By: Anner Crete M.D.   On: 02/11/2018 22:21    Procedures Procedures (including critical care time)  Medications Ordered in ED Medications  oxyCODONE-acetaminophen (PERCOCET/ROXICET) 5-325 MG per tablet 1 tablet (1 tablet Oral Given 02/11/18 2132)     Initial Impression / Assessment and Plan / ED Course  I have reviewed the triage vital signs and the nursing notes.  Pertinent labs & imaging results that were available during my care of the patient were reviewed by me and considered in my medical decision making (see chart for details).    Presents with atraumatic right hip pain.  No abdominal tenderness or urinary  symptoms. Leg is neurovascularly intact on exam.  No erythema, warmth or induration over joint to suggest infection.  Patient is able to ambulate independently.  X-ray right hip reveals arthritic changes, no acute fracture or dislocation.  Exam is also consistent with arthritis given painful log roll of left leg. Have counseled patient on use of NSAIDs for pain.  He has appointment with his orthopedic doctor tomorrow.  His blood pressure was elevated in the ER today, counseled him to have this rechecked.  Discussed return precautions and patient agrees and voices understanding to the above plan.   Final Clinical Impressions(s) / ED Diagnoses   Final diagnoses:  Right hip pain    ED Discharge Orders        Ordered    naproxen (NAPROSYN) 500 MG tablet  2 times daily     02/11/18 2251       Bernarda Caffey 02/11/18 2251    Blanchie Dessert, MD 02/12/18 2033

## 2018-02-11 NOTE — Telephone Encounter (Signed)
Come in tomorro ow

## 2018-02-11 NOTE — Telephone Encounter (Signed)
Patient called stating he is in a lot of pain and would like to speak with you as soon as possible. CB # [phone]

## 2018-02-11 NOTE — Telephone Encounter (Signed)
Patient called again, sounded like he was in a lot of pain. Just wanted to speak with you real fast whenever you get a chance. He as for a call back as soon as possible. CB # [phone]

## 2018-02-11 NOTE — Telephone Encounter (Signed)
I called patient and he states that he is having severe low back and right hip pain. States that this started on Saturday and has gotten much worse. No injury that he can recall,recently but the only thing that he can think of was when he initially was injured and fell off of the xray table at [facility]. He said that he feels like he has having a severe spasm. Cannot get comfortable. Tried tylenol, voltaren, heat and ice without relief.

## 2018-02-11 NOTE — Telephone Encounter (Signed)
IC s/w patient he was presently at ER for pain.

## 2018-02-11 NOTE — Telephone Encounter (Signed)
IC s/w patient again and advised per our earlier conversation this afternoon, we scheduled him to see Dr Marlou Sa tomorrow and that I had sent message to Dr Marlou Sa to see if he could advise if anything could be done/prescribed between now and then to help relieve some of the pain. I let him know I would be in touch with him as soon as I knew something but could not do anything without Dr Randel Pigg approval. Also did advise if he felt he could not tolerate the pain could go to an urgent care or ER

## 2018-02-11 NOTE — ED Notes (Signed)
Patient's pain decreased. Ready for home.

## 2018-02-11 NOTE — Telephone Encounter (Signed)
Patient left voicemail to let Dr. Marlou Sa know he was at the ER.

## 2018-02-11 NOTE — ED Triage Notes (Signed)
Pt reports recent fall and having progressively worsening pain to right hip, causing difficulty ambulating. Pain radiates down right leg. Pt is ambulatory at triage.

## 2018-02-12 ENCOUNTER — Other Ambulatory Visit (INDEPENDENT_AMBULATORY_CARE_PROVIDER_SITE_OTHER): Payer: Self-pay | Admitting: Orthopedic Surgery

## 2018-02-12 ENCOUNTER — Ambulatory Visit (INDEPENDENT_AMBULATORY_CARE_PROVIDER_SITE_OTHER): Payer: Medicare Other

## 2018-02-12 ENCOUNTER — Encounter (INDEPENDENT_AMBULATORY_CARE_PROVIDER_SITE_OTHER): Payer: Self-pay | Admitting: Orthopedic Surgery

## 2018-02-12 ENCOUNTER — Ambulatory Visit (INDEPENDENT_AMBULATORY_CARE_PROVIDER_SITE_OTHER): Payer: Medicare Other | Admitting: Orthopedic Surgery

## 2018-02-12 DIAGNOSIS — M5441 Lumbago with sciatica, right side: Secondary | ICD-10-CM | POA: Diagnosis not present

## 2018-02-12 MED ORDER — HYDROCODONE-ACETAMINOPHEN 5-325 MG PO TABS: 1.0000 | ORAL_TABLET | Freq: Four times a day (QID) | ORAL | 0 refills | Status: DC | PRN

## 2018-02-12 MED ORDER — METHOCARBAMOL 500 MG PO TABS: 500.0000 mg | ORAL_TABLET | Freq: Three times a day (TID) | ORAL | 0 refills | Status: DC | PRN

## 2018-02-12 MED ORDER — METHYLPREDNISOLONE 4 MG PO TABS: ORAL_TABLET | ORAL | 0 refills | Status: DC

## 2018-02-12 NOTE — Progress Notes (Signed)
Office Visit Note   Patient: Jose [name]           Date of Birth: [birth year]           MRN: [id number] Visit Date: 02/12/2018 Requested by: [name], MD [address], [place],  [address] PCP: [name], MD  Subjective: Chief Complaint  Patient presents with  . Lower Back - Pain  . Right Hip - Pain    HPI: Jose [name] is a 67 year old patient with buttock pain on the right-hand side for the last 5 days.  Denies any history of injury.  Was in the emergency room last night where he was provided with Toradol and tramadol.  He is out of baclofen.  Denies any frank groin pain.  Radiographs from that emergency room visit are reviewed.  Hip shows no arthritis.  He reports some numbness and tingling radiating down to the right toes.  He is using a cane.  He does report falling on that right hip in June 2018 when he was getting x-rays of his knees.  This happened while he was at [facility].  Thursday night he woke up with stabbing pain in the back and right buttock region.  Denies any saddle paresthesias.              ROS: All systems reviewed are negative as they relate to the chief complaint within the history of present illness.  Patient denies  fevers or chills.   Assessment & Plan: Visit Diagnoses:  1. Acute bilateral low back pain with right-sided sciatica     Plan: Impression is L5-S1 spondylolisthesis which is likely now becoming symptomatic.  Plan is 6-day Medrol Dosepak with Robaxin and Norco.  Needs MRI scan L-spine right-sided radiculopathy with likely ESI to follow.  I will see him back after that study.  Follow-Up Instructions: Return for after MRI.   Orders:  Orders Placed This Encounter  Procedures  . XR Lumbar Spine 2-3 Views   Meds ordered this encounter  Medications  . HYDROcodone-acetaminophen (NORCO/VICODIN) 5-325 MG tablet    Sig: Take 1 tablet by mouth every 6 (six) hours as needed for moderate pain.    Dispense:  30 tablet   Refill:  0  . methylPREDNISolone (MEDROL) 4 MG tablet    Sig: Take as directed    Dispense:  21 tablet    Refill:  0  . methocarbamol (ROBAXIN) 500 MG tablet    Sig: Take 1 tablet (500 mg total) by mouth every 8 (eight) hours as needed for muscle spasms.    Dispense:  30 tablet    Refill:  0      Procedures: No procedures performed   Clinical Data: No additional findings.  Objective: Vital Signs: There were no vitals taken for this visit.  Physical Exam:   Constitutional: Patient appears well-developed HEENT:  Head: Normocephalic Eyes:EOM are normal Neck: Normal range of motion Cardiovascular: Normal rate Pulmonary/chest: Effort normal Neurologic: Patient is alert Skin: Skin is warm Psychiatric: Patient has normal mood and affect    Ortho Exam: Orthopedic exam demonstrates slightly antalgic gait to the right using a cane.  Patient has no definite nerve root tension signs right versus left but does have some paresthesias in the L5 distribution right versus left.  Pedal pulses palpable.  Patient has 5 out of 5 ankle dorsiflexion plantar flexion quad and hamstring strength with negative Babinski bilaterally.  No definite saddle paresthesias present.  No groin pain with internal/external rotation  of the leg.  With  Specialty Comments:  No specialty comments available.  Imaging: Dg Hip Unilat W Or Wo Pelvis 2-3 Views Right  Result Date: 02/11/2018 CLINICAL DATA:  67 year old male with right hip pain for the past 4 days. EXAM: DG HIP (WITH OR WITHOUT PELVIS) 2-3V RIGHT COMPARISON:  Pelvic radiograph dated 06/15/2017. FINDINGS: There is no acute fracture or dislocation. Mild arthritic changes of the hips. The soft tissues appear unremarkable. IMPRESSION: 1. No acute fracture or dislocation. 2. Mild arthritic changes. Electronically Signed   By: Anner Crete M.D.   On: 02/11/2018 22:21   Xr Lumbar Spine 2-3 Views  Result Date: 02/12/2018 AP lateral lumbar spine reviewed.   Hip joints appear preserved.  No scoliosis is present.  There is grade 2 spondylolisthesis at L5-S1.  Facet arthritis also present at this level.  Remainder of the lumbar spine looks reasonable with minimal arthritic change.    PMFS History: Patient Active Problem List   Diagnosis Date Noted  . Candidiasis   . Abnormality of gait   . Hypoalbuminemia due to protein-calorie malnutrition ([place])   . History of gout   . Sleep disturbance   . Constipation   . Rupture of quadriceps tendon, right, sequela 06/19/2017  . Quadriceps tendon rupture, left, sequela 06/19/2017  . Acute blood loss anemia 06/19/2017  . Fall   . History of subdural hematoma   . Post-operative pain   . Recurrent falls   . Quadriceps tendon rupture 06/15/2017  . Primary osteoarthritis of both knees 02/28/2017  . Primary osteoarthritis of both hands 02/28/2017  . Uricacidemia 02/28/2017  . Pain in joint of left knee 02/07/2017  . Idiopathic chronic gout, unspecified site, without tophus (tophi) 11/29/2016  . HEMATURIA UNSPECIFIED 01/25/2009  . ABDOMINAL PAIN 01/25/2009  . XERODERMA 03/10/2008  . UNS ADVRS EFF UNS RX MEDICINAL&BIOLOGICAL SBSTNC 03/10/2008  . ADJUSTMENT REACTION WITH PHYSICAL SYMPTOMS 02/14/2008  . MUSCLE SPASM 02/14/2008  . ACUTE PROSTATITIS 12/16/2007  . BREAST LUMP OR MASS, RIGHT 07/12/2007  . H/O: RCT (rotator cuff tear) 01/13/2007  . GOUT 01/08/2007   Past Medical History:  Diagnosis Date  . Arthritis    R shoulder, great toes, hands- has gout & has injections in knees with Dr. Estanislado Pandy   . Cancer ([place])    skin ca-   . GERD (gastroesophageal reflux disease)   . History of hiatal hernia   . SDH (subdural hematoma) ([facility])     Family History  Problem Relation Age of Onset  . Parkinson's disease Mother   . Heart disease Father   . Parkinson's disease Brother   . Diabetes Brother     Past Surgical History:  Procedure Laterality Date  . BRAIN SURGERY  2016   evacuation of SDH  .  CARPAL TUNNEL RELEASE Bilateral   . GREEN LIGHT LASER TURP (TRANSURETHRAL RESECTION OF PROSTATE  04/27/2017  . QUADRICEPS TENDON REPAIR Bilateral 06/15/2017   Procedure: REPAIR QUADRICEP TENDON;  Surgeon: Meredith Pel, MD;  Location: [facility];  Service: Orthopedics;  Laterality: Bilateral;  . SHOULDER SURGERY Right    Social History   Occupational History  . Not on file  Tobacco Use  . Smoking status: Current Every Day Smoker    Types: Cigars  . Smokeless tobacco: Never Used  . Tobacco comment: celebration use   Substance and Sexual Activity  . Alcohol use: Yes    Alcohol/week: 1.8 oz    Types: 2 Glasses of wine, 1 Shots of liquor per  week    Comment: 2 drinks per day  . Drug use: No  . Sexual activity: Not on file

## 2018-02-12 NOTE — Telephone Encounter (Signed)
y

## 2018-02-12 NOTE — Telephone Encounter (Signed)
Ok to rf? 

## 2018-02-15 ENCOUNTER — Telehealth (INDEPENDENT_AMBULATORY_CARE_PROVIDER_SITE_OTHER): Payer: Self-pay | Admitting: Orthopedic Surgery

## 2018-02-15 DIAGNOSIS — M5410 Radiculopathy, site unspecified: Secondary | ICD-10-CM

## 2018-02-15 NOTE — Telephone Encounter (Signed)
Patient said Dr. Marlou Sa was supposed to order an MRI or a CT but he hasnt heard anything about getting it scheduled. I didn't see any referrals for one either, please advise [phone]

## 2018-02-15 NOTE — Telephone Encounter (Signed)
IC s/w patient. Advised order for scan submitted and he would be contacted to get this scheduled once auth'd by insurance.

## 2018-02-18 ENCOUNTER — Other Ambulatory Visit: Payer: Self-pay

## 2018-02-18 ENCOUNTER — Ambulatory Visit (HOSPITAL_COMMUNITY): Payer: Medicare Other | Attending: Cardiology

## 2018-02-18 DIAGNOSIS — Q231 Congenital insufficiency of aortic valve: Secondary | ICD-10-CM | POA: Insufficient documentation

## 2018-02-18 DIAGNOSIS — I5189 Other ill-defined heart diseases: Secondary | ICD-10-CM | POA: Diagnosis not present

## 2018-02-18 DIAGNOSIS — Z720 Tobacco use: Secondary | ICD-10-CM | POA: Insufficient documentation

## 2018-02-18 DIAGNOSIS — I350 Nonrheumatic aortic (valve) stenosis: Secondary | ICD-10-CM

## 2018-02-19 ENCOUNTER — Telehealth: Payer: Self-pay | Admitting: Cardiovascular Disease

## 2018-02-19 ENCOUNTER — Telehealth: Payer: Self-pay

## 2018-02-19 DIAGNOSIS — I350 Nonrheumatic aortic (valve) stenosis: Secondary | ICD-10-CM

## 2018-02-19 NOTE — Telephone Encounter (Signed)
Sent results through [facility]. Placed order for echo, to be scheduled in one year.

## 2018-02-19 NOTE — Telephone Encounter (Signed)
Called patient with echo results, and clarified results for patient. Patient verbalized understanding.

## 2018-02-19 NOTE — Telephone Encounter (Signed)
Jose [name] is calling because he had a echocardiogram done on yesterday and is asking for clarification of the results . Please call   Thanks

## 2018-02-19 NOTE — Telephone Encounter (Signed)
-----   Message from Josue Hector, MD sent at 02/19/2018  7:49 AM EST ----- Low normal EF moderate AS mean gradient has gone up a bit from 22 to 30 mmHg f/u echo in a year

## 2018-02-20 ENCOUNTER — Encounter (INDEPENDENT_AMBULATORY_CARE_PROVIDER_SITE_OTHER): Payer: Self-pay | Admitting: Orthopedic Surgery

## 2018-02-20 ENCOUNTER — Ambulatory Visit (INDEPENDENT_AMBULATORY_CARE_PROVIDER_SITE_OTHER): Payer: Medicare Other | Admitting: Orthopedic Surgery

## 2018-02-20 DIAGNOSIS — M5410 Radiculopathy, site unspecified: Secondary | ICD-10-CM

## 2018-02-20 NOTE — Progress Notes (Signed)
Office Visit Note   Patient: Jose [name]           Date of Birth: 06-01-1951           MRN: [id number] Visit Date: 02/20/2018 Requested by: [name], MD [address] [place], [place] [address] PCP: [name], MD  Subjective: Chief Complaint  Patient presents with  . Follow-up    bilateral quad tendon repair on 06/15/17    HPI: Jose [name] the patient with bilateral quad tendon repair but he is been having some back pain recently.  He did start to get some relief from the Medrol Dosepak on Sunday.  MRI scan of his back is pending.  Still does have pain and discomfort and right greater than left pain with numbness and tingling in the leg.  He is not traveling now with work.  He finished his Medrol Dosepak on Monday.              ROS: All systems reviewed are negative as they relate to the chief complaint within the history of present illness.  Patient denies  fevers or chills.   Assessment & Plan: Visit Diagnoses:  1. Radicular leg pain     Plan: Impression is radicular leg pain right greater than left.  He still has very good quad and hamstring strength as well as ankle dorsiflexion and plantar flexion strength bilaterally.  Needs MRI scan because I think he is got either bulging disc or some component of spinal stenosis.  Symptoms are worse when he is walking.  Also has some symptoms when he is laying down.  I will see him back after MRI scan.  Likely ESI to follow.  Follow-Up Instructions: Return for after MRI.   Orders:  No orders of the defined types were placed in this encounter.  No orders of the defined types were placed in this encounter.     Procedures: No procedures performed   Clinical Data: No additional findings.  Objective: Vital Signs: There were no vitals taken for this visit.  Physical Exam:   Constitutional: Patient appears well-developed HEENT:  Head: Normocephalic Eyes:EOM are normal Neck: Normal range of  motion Cardiovascular: Normal rate Pulmonary/chest: Effort normal Neurologic: Patient is alert Skin: Skin is warm Psychiatric: Patient has normal mood and affect  Orthopedic exam demonstrates full active and passive range of motion of the right leg.  Left leg also has excellent range of motion.  Ortho Exam: Collaterals are stable.  Patient has 5 out of 5 ankle dorsiflex and plantar flexion quad hamstring strength.  Has palpable pedal pulses.  No nerve root tension signs and no groin pain with internal/external rotation of either leg.  No definite paresthesias in either leg.  Specialty Comments:  No specialty comments available.  Imaging: No results found.   PMFS History: Patient Active Problem List   Diagnosis Date Noted  . Candidiasis   . Abnormality of gait   . Hypoalbuminemia due to protein-calorie malnutrition ([place])   . History of gout   . Sleep disturbance   . Constipation   . Rupture of quadriceps tendon, right, sequela 06/19/2017  . Quadriceps tendon rupture, left, sequela 06/19/2017  . Acute blood loss anemia 06/19/2017  . Fall   . History of subdural hematoma   . Post-operative pain   . Recurrent falls   . Quadriceps tendon rupture 06/15/2017  . Primary osteoarthritis of both knees 02/28/2017  . Primary osteoarthritis of both hands 02/28/2017  . Uricacidemia 02/28/2017  .  Pain in joint of left knee 02/07/2017  . Idiopathic chronic gout, unspecified site, without tophus (tophi) 11/29/2016  . HEMATURIA UNSPECIFIED 01/25/2009  . ABDOMINAL PAIN 01/25/2009  . XERODERMA 03/10/2008  . UNS ADVRS EFF UNS RX MEDICINAL&BIOLOGICAL SBSTNC 03/10/2008  . ADJUSTMENT REACTION WITH PHYSICAL SYMPTOMS 02/14/2008  . MUSCLE SPASM 02/14/2008  . ACUTE PROSTATITIS 12/16/2007  . BREAST LUMP OR MASS, RIGHT 07/12/2007  . H/O: RCT (rotator cuff tear) 01/13/2007  . GOUT 01/08/2007   Past Medical History:  Diagnosis Date  . Arthritis    R shoulder, great toes, hands- has gout & has  injections in knees with Dr. Estanislado Pandy   . Cancer ([facility])    skin ca-   . GERD (gastroesophageal reflux disease)   . History of hiatal hernia   . SDH (subdural hematoma) ([facility])     Family History  Problem Relation Age of Onset  . Parkinson's disease Mother   . Heart disease Father   . Parkinson's disease Brother   . Diabetes Brother     Past Surgical History:  Procedure Laterality Date  . BRAIN SURGERY  2016   evacuation of SDH  . CARPAL TUNNEL RELEASE Bilateral   . GREEN LIGHT LASER TURP (TRANSURETHRAL RESECTION OF PROSTATE  04/27/2017  . QUADRICEPS TENDON REPAIR Bilateral 06/15/2017   Procedure: REPAIR QUADRICEP TENDON;  Surgeon: Meredith Pel, MD;  Location: [facility];  Service: Orthopedics;  Laterality: Bilateral;  . SHOULDER SURGERY Right    Social History   Occupational History  . Not on file  Tobacco Use  . Smoking status: Current Every Day Smoker    Types: Cigars  . Smokeless tobacco: Never Used  . Tobacco comment: celebration use   Substance and Sexual Activity  . Alcohol use: Yes    Alcohol/week: 1.8 oz    Types: 2 Glasses of wine, 1 Shots of liquor per week    Comment: 2 drinks per day  . Drug use: No  . Sexual activity: Not on file

## 2018-02-21 ENCOUNTER — Telehealth (INDEPENDENT_AMBULATORY_CARE_PROVIDER_SITE_OTHER): Payer: Self-pay | Admitting: Orthopedic Surgery

## 2018-02-21 MED ORDER — HYDROCODONE-ACETAMINOPHEN 5-325 MG PO TABS: 1.0000 | ORAL_TABLET | Freq: Three times a day (TID) | ORAL | 0 refills | Status: DC | PRN

## 2018-02-21 MED ORDER — PREDNISONE 5 MG (21) PO TBPK: ORAL_TABLET | ORAL | 0 refills | Status: DC

## 2018-02-21 NOTE — Telephone Encounter (Signed)
IC s/w patient.  Appt to review scan scheduled for Tuesday. I s/w Dr Marlou Sa who ok'd refill on dosepak and norco to hold patient until he can get in to review results on Tuesday.

## 2018-02-21 NOTE — Addendum Note (Signed)
Addended byLaurann Montana on: 02/21/2018 04:28 PM   Modules accepted: Orders

## 2018-02-21 NOTE — Telephone Encounter (Signed)
Patient called stating that his MRI is scheduled for tomorrow, but he has started having sharpe pains in his lower back and would like to speak with you. CB # [phone]

## 2018-02-22 ENCOUNTER — Ambulatory Visit
Admission: RE | Admit: 2018-02-22 | Discharge: 2018-02-22 | Disposition: A | Discharge status: Home / Self Care | Payer: Medicare Other | Source: Ambulatory Visit | Attending: Orthopedic Surgery | Admitting: Orthopedic Surgery

## 2018-02-22 DIAGNOSIS — M5410 Radiculopathy, site unspecified: Secondary | ICD-10-CM

## 2018-02-22 DIAGNOSIS — M48061 Spinal stenosis, lumbar region without neurogenic claudication: Secondary | ICD-10-CM | POA: Diagnosis not present

## 2018-02-26 ENCOUNTER — Ambulatory Visit (INDEPENDENT_AMBULATORY_CARE_PROVIDER_SITE_OTHER): Payer: Medicare Other | Admitting: Orthopedic Surgery

## 2018-02-26 ENCOUNTER — Encounter (INDEPENDENT_AMBULATORY_CARE_PROVIDER_SITE_OTHER): Payer: Self-pay | Admitting: Orthopedic Surgery

## 2018-02-26 DIAGNOSIS — M5441 Lumbago with sciatica, right side: Secondary | ICD-10-CM | POA: Diagnosis not present

## 2018-02-26 MED ORDER — HYDROCODONE-ACETAMINOPHEN 5-325 MG PO TABS: ORAL_TABLET | ORAL | 0 refills | Status: DC

## 2018-02-26 MED ORDER — NAPROXEN 500 MG PO TABS: ORAL_TABLET | ORAL | 0 refills | Status: DC

## 2018-02-26 MED ORDER — METHOCARBAMOL 500 MG PO TABS: ORAL_TABLET | ORAL | 0 refills | Status: DC

## 2018-02-27 ENCOUNTER — Ambulatory Visit (INDEPENDENT_AMBULATORY_CARE_PROVIDER_SITE_OTHER): Payer: Self-pay

## 2018-02-27 ENCOUNTER — Encounter (INDEPENDENT_AMBULATORY_CARE_PROVIDER_SITE_OTHER): Payer: Self-pay | Admitting: Physical Medicine and Rehabilitation

## 2018-02-27 ENCOUNTER — Encounter (INDEPENDENT_AMBULATORY_CARE_PROVIDER_SITE_OTHER): Payer: Self-pay | Admitting: Orthopedic Surgery

## 2018-02-27 ENCOUNTER — Ambulatory Visit (INDEPENDENT_AMBULATORY_CARE_PROVIDER_SITE_OTHER): Payer: Medicare Other | Admitting: Physical Medicine and Rehabilitation

## 2018-02-27 VITALS — BP 129/97 | HR 83 | Temp 98.1°F

## 2018-02-27 DIAGNOSIS — M5416 Radiculopathy, lumbar region: Secondary | ICD-10-CM

## 2018-02-27 DIAGNOSIS — M5116 Intervertebral disc disorders with radiculopathy, lumbar region: Secondary | ICD-10-CM | POA: Diagnosis not present

## 2018-02-27 NOTE — Progress Notes (Signed)
   Numeric Pain Rating Scale and Functional Assessment Average Pain (8)   In the last MONTH (on 0-10 scale) has pain interfered with the following?  1. General activity like being  able to carry out your everyday physical activities such as walking, climbing stairs, carrying groceries, or moving a chair?  Rating(9)   +Driver, -BT, -Dye Allergies.  

## 2018-02-27 NOTE — Progress Notes (Signed)
Office Visit Note   Patient: Jose [name]           Date of Birth: [birth year]           MRN: [id number] Visit Date: 02/26/2018 Requested by: [name], MD [address] [place], [place] [address] PCP: [name], MD  Subjective: Chief Complaint  Patient presents with  . Lower Back - Follow-up    HPI: Jose [name] is a patient with low back pain.  Since I have seen him he has had an MRI scan of his lumbar spine.  2 weeks ago his pain was unbearable now it is better.  He is taking anti-inflammatory and pain meds.  MRI scan is reviewed and he does have a right-sided L3-4 disc.  He is using a cane for ambulation              ROS: All systems reviewed are negative as they relate to the chief complaint within the history of present illness.  Patient denies  fevers or chills.   Assessment & Plan: Visit Diagnoses:  1. Acute right-sided low back pain with right-sided sciatica     Plan: Impression is right sided L3-4 disc which may or may not be operative.  Plan is to refill Norco muscle relaxer nonsteroidal.  Start physical therapy next week.  Arrange for him to have a foraminal injection on 02/27/2018.  I will see him back in 3 weeks for decision for or against surgical referral  Follow-Up Instructions: No Follow-up on file.   Orders:  No orders of the defined types were placed in this encounter.  Meds ordered this encounter  Medications  . HYDROcodone-acetaminophen (NORCO/VICODIN) 5-325 MG tablet    Sig: 1 po tid prn pain    Dispense:  30 tablet    Refill:  0  . methocarbamol (ROBAXIN) 500 MG tablet    Sig: 1 po q 8 hrs prn    Dispense:  30 tablet    Refill:  0  . naproxen (NAPROSYN) 500 MG tablet    Sig: 1 po q d prn    Dispense:  30 tablet    Refill:  0      Procedures: No procedures performed   Clinical Data: No additional findings.  Objective: Vital Signs: There were no vitals taken for this visit.  Physical Exam:   Constitutional: Patient  appears well-developed HEENT:  Head: Normocephalic Eyes:EOM are normal Neck: Normal range of motion Cardiovascular: Normal rate Pulmonary/chest: Effort normal Neurologic: Patient is alert Skin: Skin is warm Psychiatric: Patient has normal mood and affect    Ortho Exam: Orthopedic exam demonstrates some nerve root tension signs on the right.  Strength is actually reasonable with hip flexion abduction and adduction.  He has had bilateral quad repairs in the left one is a little weaker than the right.  No definite paresthesias today L1-S1.  Specialty Comments:  No specialty comments available.  Imaging: No results found.   PMFS History: Patient Active Problem List   Diagnosis Date Noted  . Candidiasis   . Abnormality of gait   . Hypoalbuminemia due to protein-calorie malnutrition ([place])   . History of gout   . Sleep disturbance   . Constipation   . Rupture of quadriceps tendon, right, sequela 06/19/2017  . Quadriceps tendon rupture, left, sequela 06/19/2017  . Acute blood loss anemia 06/19/2017  . Fall   . History of subdural hematoma   . Post-operative pain   . Recurrent falls   .  Quadriceps tendon rupture 06/15/2017  . Primary osteoarthritis of both knees 02/28/2017  . Primary osteoarthritis of both hands 02/28/2017  . Uricacidemia 02/28/2017  . Pain in joint of left knee 02/07/2017  . Idiopathic chronic gout, unspecified site, without tophus (tophi) 11/29/2016  . HEMATURIA UNSPECIFIED 01/25/2009  . ABDOMINAL PAIN 01/25/2009  . XERODERMA 03/10/2008  . UNS ADVRS EFF UNS RX MEDICINAL&BIOLOGICAL SBSTNC 03/10/2008  . ADJUSTMENT REACTION WITH PHYSICAL SYMPTOMS 02/14/2008  . MUSCLE SPASM 02/14/2008  . ACUTE PROSTATITIS 12/16/2007  . BREAST LUMP OR MASS, RIGHT 07/12/2007  . H/O: RCT (rotator cuff tear) 01/13/2007  . GOUT 01/08/2007   Past Medical History:  Diagnosis Date  . Arthritis    R shoulder, great toes, hands- has gout & has injections in knees with Dr.  Estanislado Pandy   . Cancer ([facility])    skin ca-   . GERD (gastroesophageal reflux disease)   . History of hiatal hernia   . SDH (subdural hematoma) ([facility])     Family History  Problem Relation Age of Onset  . Parkinson's disease Mother   . Heart disease Father   . Parkinson's disease Brother   . Diabetes Brother     Past Surgical History:  Procedure Laterality Date  . BRAIN SURGERY  2016   evacuation of SDH  . CARPAL TUNNEL RELEASE Bilateral   . GREEN LIGHT LASER TURP (TRANSURETHRAL RESECTION OF PROSTATE  04/27/2017  . QUADRICEPS TENDON REPAIR Bilateral 06/15/2017   Procedure: REPAIR QUADRICEP TENDON;  Surgeon: Meredith Pel, MD;  Location: [facility];  Service: Orthopedics;  Laterality: Bilateral;  . SHOULDER SURGERY Right    Social History   Occupational History  . Not on file  Tobacco Use  . Smoking status: Current Every Day Smoker    Types: Cigars  . Smokeless tobacco: Never Used  . Tobacco comment: celebration use   Substance and Sexual Activity  . Alcohol use: Yes    Alcohol/week: 1.8 oz    Types: 2 Glasses of wine, 1 Shots of liquor per week    Comment: 2 drinks per day  . Drug use: No  . Sexual activity: Not on file

## 2018-02-28 ENCOUNTER — Telehealth (INDEPENDENT_AMBULATORY_CARE_PROVIDER_SITE_OTHER): Payer: Self-pay | Admitting: Orthopedic Surgery

## 2018-02-28 DIAGNOSIS — M5416 Radiculopathy, lumbar region: Secondary | ICD-10-CM | POA: Diagnosis not present

## 2018-02-28 DIAGNOSIS — F438 Other reactions to severe stress: Secondary | ICD-10-CM | POA: Diagnosis not present

## 2018-02-28 DIAGNOSIS — M5116 Intervertebral disc disorders with radiculopathy, lumbar region: Secondary | ICD-10-CM

## 2018-02-28 MED ORDER — BETAMETHASONE SOD PHOS & ACET 6 (3-3) MG/ML IJ SUSP
12.0000 mg | Freq: Once | INTRAMUSCULAR | Status: AC
  Administered 2018-02-28: 12 mg

## 2018-02-28 NOTE — Telephone Encounter (Signed)
Patient would like to talk to you in regards to his injections that he received yesterday.  [phone].  Thank you.

## 2018-02-28 NOTE — Telephone Encounter (Signed)
S/w patient and he is already feeling good after injection with Dr Ernestina Patches yesterday.  Still some pain but not as sharp he is confident that he is going to get great relief from the injection. He just wanted to let you know.

## 2018-02-28 NOTE — Patient Instructions (Signed)

## 2018-03-01 NOTE — Telephone Encounter (Signed)
ok 

## 2018-03-04 ENCOUNTER — Encounter: Payer: Self-pay | Admitting: Physical Therapy

## 2018-03-04 ENCOUNTER — Other Ambulatory Visit: Payer: Self-pay

## 2018-03-04 ENCOUNTER — Ambulatory Visit: Payer: Medicare Other | Attending: Orthopedic Surgery | Admitting: Physical Therapy

## 2018-03-04 DIAGNOSIS — F438 Other reactions to severe stress: Secondary | ICD-10-CM | POA: Diagnosis not present

## 2018-03-04 DIAGNOSIS — M545 Low back pain, unspecified: Secondary | ICD-10-CM

## 2018-03-04 DIAGNOSIS — M25562 Pain in left knee: Secondary | ICD-10-CM | POA: Diagnosis not present

## 2018-03-04 DIAGNOSIS — M62838 Other muscle spasm: Secondary | ICD-10-CM | POA: Diagnosis not present

## 2018-03-04 DIAGNOSIS — R262 Difficulty in walking, not elsewhere classified: Secondary | ICD-10-CM | POA: Insufficient documentation

## 2018-03-04 DIAGNOSIS — M25561 Pain in right knee: Secondary | ICD-10-CM | POA: Insufficient documentation

## 2018-03-04 DIAGNOSIS — G8929 Other chronic pain: Secondary | ICD-10-CM | POA: Insufficient documentation

## 2018-03-05 ENCOUNTER — Encounter: Payer: Self-pay | Admitting: Physical Therapy

## 2018-03-05 ENCOUNTER — Ambulatory Visit: Payer: Medicare Other | Admitting: Physical Therapy

## 2018-03-05 DIAGNOSIS — M25562 Pain in left knee: Secondary | ICD-10-CM | POA: Diagnosis not present

## 2018-03-05 DIAGNOSIS — M62838 Other muscle spasm: Secondary | ICD-10-CM | POA: Diagnosis not present

## 2018-03-05 DIAGNOSIS — M545 Low back pain, unspecified: Secondary | ICD-10-CM

## 2018-03-05 DIAGNOSIS — G8929 Other chronic pain: Secondary | ICD-10-CM

## 2018-03-05 DIAGNOSIS — R262 Difficulty in walking, not elsewhere classified: Secondary | ICD-10-CM

## 2018-03-05 DIAGNOSIS — M25561 Pain in right knee: Secondary | ICD-10-CM | POA: Diagnosis not present

## 2018-03-05 NOTE — Therapy (Signed)
[place], [place], [address] Phone: [phone]   Fax:  [phone]  Physical Therapy Evaluation  Patient Details  Name: Jose [name] MRN: [id number] Date of Birth: 06-03-1951 Referring Provider: Dr Heather Roberts    Encounter Date: 03/04/2018  PT End of Session - 03/04/18 1659    Visit Number  1    Number of Visits  18    Date for PT Re-Evaluation  04/15/18    Authorization Type  Blue cross blue shield     PT Start Time  1545    PT Stop Time  1630    PT Time Calculation (min)  45 min    Activity Tolerance  Patient tolerated treatment well    Behavior During Therapy  [facility] for tasks assessed/performed       Past Medical History:  Diagnosis Date  . Arthritis    R shoulder, great toes, hands- has gout & has injections in knees with Dr. Estanislado Pandy   . Cancer ([place])    skin ca-   . GERD (gastroesophageal reflux disease)   . History of hiatal hernia   . SDH (subdural hematoma) (HCC)     Past Surgical History:  Procedure Laterality Date  . BRAIN SURGERY  2016   evacuation of SDH  . CARPAL TUNNEL RELEASE Bilateral   . GREEN LIGHT LASER TURP (TRANSURETHRAL RESECTION OF PROSTATE  04/27/2017  . QUADRICEPS TENDON REPAIR Bilateral 06/15/2017   Procedure: REPAIR QUADRICEP TENDON;  Surgeon: Meredith Pel, MD;  Location: [facility];  Service: Orthopedics;  Laterality: Bilateral;  . SHOULDER SURGERY Right     There were no vitals filed for this visit.   Subjective Assessment - 03/05/18 1154    Subjective  Pt reports that he has performed HEP twice since yesterdays visit. Pt reports no increase in pain or soreness from HEP. Pt denies pain coming into therapy today.   (Pended)     Limitations  --  (Pended)     Currently in Pain?  No/denies  (Pended)     Pain Score  0-No pain  (Pended)          [facility] PT Assessment - 03/05/18 0001      Assessment   Medical Diagnosis  Right sided low back pain    Referring Provider  Dr Heather Roberts     Onset Date/Surgical Date  02/11/18    Hand Dominance  Right    Next MD Visit  Nothing scheudled     Prior Therapy  Fotr bilateral quad tear       Precautions   Precautions  None      Restrictions   Weight Bearing Restrictions  No      Balance Screen   Has the patient fallen in the past 6 months  No    Has the patient had a decrease in activity level because of a fear of falling?   No    Is the patient reluctant to leave their home because of a fear of falling?   No      Home Environment   Additional Comments  17 steps inside the house. Walking up and down stairs       Prior Function   Level of Independence  Independent with community mobility with device    Leisure  Going to the gym; Golf       Cognition   Overall Cognitive Status  Within Functional Limits for tasks assessed  Attention  Focused    Focused Attention  Appears intact    Memory  Appears intact    Awareness  Appears intact    Problem Solving  Appears intact      Observation/Other Assessments   Observations  wearing brace on the left       Sensation   Additional Comments  pain that radiates into his buttocks at times       AROM   Lumbar Flexion  25% LIMITED     Lumbar Extension  50%    Lumbar - Right Side Bend  pain with right side bending     Lumbar - Left Side Bend  no limit     Lumbar - Right Rotation  no limit     Lumbar - Left Rotation  no limit       PROM   Overall PROM Comments  No restrictions in hip motion       Strength   Right Hip Flexion  4/5    Right Hip ADduction  5/5    Left Hip Flexion  4+/5      Ambulation/Gait   Gait Comments  decreased left single leg stance time on the left. Incresed movement to the right.              Objective measurements completed on examination: See above findings.      [facility] Adult PT Treatment/Exercise - 03/05/18 0001      Lumbar Exercises: Stretches   Piriformis Stretch Limitations  3x20 sec  hold       Lumbar Exercises: Standing   Other Standing Lumbar Exercises  tennis ball tirgger point release       Lumbar Exercises: Supine   Bent Knee Raise Limitations  x10     Bridge Limitations  x10              PT Education - 03/04/18 1657    Education Details  HEP; Symptom management; Lumbar anatomy     Person(s) Educated  Patient    Methods  Explanation;Demonstration;Verbal cues    Comprehension  Verbalized understanding;Returned demonstration;Need further instruction       PT Short Term Goals - 03/05/18 0757      PT SHORT TERM GOAL #1   Title  Patient will become independent with HEP for core strength and stretching.    Baseline  pain 4/10     Time  3    Period  Weeks    Status  New    Target Date  03/26/18      PT SHORT TERM GOAL #2   Title  Patient will decrease self reported tenderness in the lumbar paraspinals with palpation.    Time  3    Period  Weeks    Status  New    Target Date  03/26/18      PT SHORT TERM GOAL #3   Title  Patient will increase lumbar extensbility by 25%.    Time  3    Period  Weeks    Status  New    Target Date  03/26/18        PT Long Term Goals - 03/05/18 0811      PT LONG TERM GOAL #1   Title  Patient will demonstrat a 35% limitaion on FOTO.    Time  6    Period  Weeks    Status  New    Target Date  04/16/18      PT LONG TERM  GOAL #2   Title  Pt will increase hip flexor strength from a 4/5 to a 5/5 in order to stand at work greater than 30 mins with no pain.    Time  6    Period  Weeks    Status  New    Target Date  04/16/18      PT LONG TERM GOAL #3   Title  Patient will increase core strength in odert o stabilize the lower back to perform daily functional activities.     Time  6    Period  Weeks    Status  New    Target Date  04/16/18             Plan - 03/04/18 1701    Clinical Impression Statement  Patient is a 67 year old male with right sided low back pain. He recently had an injection which  improved his pain and decreased his radiuclar symptoms. Signs and symptoms are consistent with diagnosis of lumbar disc degeneration.  He presented with limited right hip flexion strength and spasming in the lumbar paraspianls and upper gluteals. Patient would beneift from skilled therapy to decrease spasming in the hip and back and to improve core strength. He was seen for a low complexity evaluation.     History and Personal Factors relevant to plan of care:  bilateral quad tears; subdural hematoma;      Clinical Presentation  Stable    Clinical Decision Making  Low    Rehab Potential  Good    PT Frequency  3x / week    PT Duration  8 weeks    PT Treatment/Interventions  ADLs/Self Care Home Management;Cryotherapy;Electrical Stimulation;Moist Heat;Traction;Ultrasound;Gait Scientist, forensic;Therapeutic activities;Therapeutic exercise;Patient/family education;Manual techniques;Passive range of motion;Dry needling;Taping    PT Next Visit Plan  soft tissue mobilization to the lower  back; progress HEP for hip strenghtening; dry needling; LAD; look at joint mobility; LAD;     PT Home Exercise Plan  SLR; bridge, LAQ yellow; hamstring yellow; standing march     Consulted and Agree with Plan of Care  Patient       Patient will benefit from skilled therapeutic intervention in order to improve the following deficits and impairments:  Abnormal gait, Pain, Increased muscle spasms, Decreased activity tolerance, Decreased endurance, Decreased strength  Visit Diagnosis: Chronic right-sided low back pain without sciatica - Plan: PT plan of care cert/re-cert  Other muscle spasm - Plan: PT plan of care cert/re-cert  Difficulty in walking, not elsewhere classified - Plan: PT plan of care cert/re-cert     Problem List Patient Active Problem List   Diagnosis Date Noted  . Candidiasis   . Abnormality of gait   . Hypoalbuminemia due to protein-calorie malnutrition ([place])   . History of gout   . Sleep  disturbance   . Constipation   . Rupture of quadriceps tendon, right, sequela 06/19/2017  . Quadriceps tendon rupture, left, sequela 06/19/2017  . Acute blood loss anemia 06/19/2017  . Fall   . History of subdural hematoma   . Post-operative pain   . Recurrent falls   . Quadriceps tendon rupture 06/15/2017  . Primary osteoarthritis of both knees 02/28/2017  . Primary osteoarthritis of both hands 02/28/2017  . Uricacidemia 02/28/2017  . Pain in joint of left knee 02/07/2017  . Idiopathic chronic gout, unspecified site, without tophus (tophi) 11/29/2016  . HEMATURIA UNSPECIFIED 01/25/2009  . ABDOMINAL PAIN 01/25/2009  . XERODERMA 03/10/2008  . UNS ADVRS EFF UNS  RX MEDICINAL&BIOLOGICAL SBSTNC 03/10/2008  . ADJUSTMENT REACTION WITH PHYSICAL SYMPTOMS 02/14/2008  . MUSCLE SPASM 02/14/2008  . ACUTE PROSTATITIS 12/16/2007  . BREAST LUMP OR MASS, RIGHT 07/12/2007  . H/O: RCT (rotator cuff tear) 01/13/2007  . GOUT 01/08/2007    Carney Living PT DPT  03/05/2018, 1:00 PM  [facility] [address] [place], [place], [address] Phone: [phone]   Fax:  [phone]  Name: Jose [name] MRN: [id number] Date of Birth: Oct 02, 1951

## 2018-03-06 NOTE — Addendum Note (Signed)
Addended byLaurann Montana on: 03/06/2018 09:37 AM   Modules accepted: Orders

## 2018-03-06 NOTE — Therapy (Signed)
[place], [place], [address] Phone: [phone]   Fax:  [phone]  Physical Therapy Treatment  Patient Details  Name: Jose [name] MRN: [id number] Date of Birth: 25-May-1951 Referring Provider: Dr Heather Roberts    Encounter Date: 03/05/2018  PT End of Session - 03/06/18 1254    Visit Number  2    Number of Visits  18    Date for PT Re-Evaluation  04/15/18    Authorization Type  Blue cross blue shield     PT Start Time  1145    PT Stop Time  1228    PT Time Calculation (min)  43 min    Activity Tolerance  Patient tolerated treatment well    Behavior During Therapy  [facility] for tasks assessed/performed       Past Medical History:  Diagnosis Date  . Arthritis    R shoulder, great toes, hands- has gout & has injections in knees with Dr. Estanislado Pandy   . Cancer ([place])    skin ca-   . GERD (gastroesophageal reflux disease)   . History of hiatal hernia   . SDH (subdural hematoma) (HCC)     Past Surgical History:  Procedure Laterality Date  . BRAIN SURGERY  2016   evacuation of SDH  . CARPAL TUNNEL RELEASE Bilateral   . GREEN LIGHT LASER TURP (TRANSURETHRAL RESECTION OF PROSTATE  04/27/2017  . QUADRICEPS TENDON REPAIR Bilateral 06/15/2017   Procedure: REPAIR QUADRICEP TENDON;  Surgeon: Meredith Pel, MD;  Location: [facility];  Service: Orthopedics;  Laterality: Bilateral;  . SHOULDER SURGERY Right     There were no vitals filed for this visit.  Subjective Assessment - 03/05/18 1154    Subjective  Pt reports that he has performed HEP twice since yesterdays visit. Pt reports no increase in pain or soreness from HEP. Pt denies pain coming into therapy today.   (Pended)     Limitations  --  (Pended)     Currently in Pain?  No/denies  (Pended)     Pain Score  0-No pain  (Pended)                       [facility] Adult PT Treatment/Exercise - 03/06/18 0001      Lumbar Exercises:  Stretches   Piriformis Stretch Limitations  3x20 sec hold       Lumbar Exercises: Standing   Other Standing Lumbar Exercises  standing extension red 2x10; scap retractions 2x10 red       Manual Therapy   Manual Therapy  Soft tissue mobilization;Manual Traction    Soft tissue mobilization  IASTYM to lumbar spine; held after report of pain     Manual Traction  LAD of right lower extremity 5x30 sec hold        Trigger Point Dry Needling - 03/06/18 1253    Consent Given?  Yes    Education Handout Provided  Yes    Muscles Treated Upper Body  Longissimus    Longissimus Response  Twitch response elicited           PT Education - 03/06/18 1254    Education provided  Yes    Education Details  reviewed HEP, symotkm mangement     Person(s) Educated  Patient    Methods  Demonstration;Tactile cues;Explanation;Verbal cues;Handout    Comprehension  Verbalized understanding;Returned demonstration;Verbal cues required;Tactile cues required       PT Short Term Goals -  03/05/18 0757      PT SHORT TERM GOAL #1   Title  Patient will become independent with HEP for core strength and stretching.    Baseline  pain 4/10     Time  3    Period  Weeks    Status  New    Target Date  03/26/18      PT SHORT TERM GOAL #2   Title  Patient will decrease self reported tenderness in the lumbar paraspinals with palpation.    Time  3    Period  Weeks    Status  New    Target Date  03/26/18      PT SHORT TERM GOAL #3   Title  Patient will increase lumbar extensbility by 25%.    Time  3    Period  Weeks    Status  New    Target Date  03/26/18        PT Long Term Goals - 03/05/18 0811      PT LONG TERM GOAL #1   Title  Patient will demonstrat a 35% limitaion on FOTO.    Time  6    Period  Weeks    Status  New    Target Date  04/16/18      PT LONG TERM GOAL #2   Title  Pt will increase hip flexor strength from a 4/5 to a 5/5 in order to stand at work greater than 30 mins with no pain.     Time  6    Period  Weeks    Status  New    Target Date  04/16/18      PT LONG TERM GOAL #3   Title  Patient will increase core strength in odert o stabilize the lower back to perform daily functional activities.     Time  6    Period  Weeks    Status  New    Target Date  04/16/18            Plan - 03/06/18 1254    Clinical Impression Statement  Patient emdonstrated a good twitch respose with TPDN. He did report an increase inpain with soft tissue work. he reported an improvement after standing and walking for a minute. He reports this is what his pain does on a regular basis. Patient goiven standing core exercises 2nd to increased painlying supine and prone.     Clinical Presentation  Stable    Clinical Decision Making  Low    Rehab Potential  Good    PT Frequency  3x / week    PT Duration  8 weeks    PT Treatment/Interventions  ADLs/Self Care Home Management;Cryotherapy;Electrical Stimulation;Moist Heat;Traction;Ultrasound;Gait Scientist, forensic;Therapeutic activities;Therapeutic exercise;Patient/family education;Manual techniques;Passive range of motion;Dry needling;Taping    PT Next Visit Plan  soft tissue mobilization to the lower  back; progress HEP for hip strenghtening; dry needling; LAD; look at joint mobility; LAD;     PT Home Exercise Plan  SLR; bridge, LAQ yellow; hamstring yellow; standing march     Consulted and Agree with Plan of Care  Patient       Patient will benefit from skilled therapeutic intervention in order to improve the following deficits and impairments:  Abnormal gait, Pain, Increased muscle spasms, Decreased activity tolerance, Decreased endurance, Decreased strength  Visit Diagnosis: Chronic right-sided low back pain without sciatica  Other muscle spasm  Difficulty in walking, not elsewhere classified     Problem List Patient  Active Problem List   Diagnosis Date Noted  . Candidiasis   . Abnormality of gait   . Hypoalbuminemia due  to protein-calorie malnutrition ([place])   . History of gout   . Sleep disturbance   . Constipation   . Rupture of quadriceps tendon, right, sequela 06/19/2017  . Quadriceps tendon rupture, left, sequela 06/19/2017  . Acute blood loss anemia 06/19/2017  . Fall   . History of subdural hematoma   . Post-operative pain   . Recurrent falls   . Quadriceps tendon rupture 06/15/2017  . Primary osteoarthritis of both knees 02/28/2017  . Primary osteoarthritis of both hands 02/28/2017  . Uricacidemia 02/28/2017  . Pain in joint of left knee 02/07/2017  . Idiopathic chronic gout, unspecified site, without tophus (tophi) 11/29/2016  . HEMATURIA UNSPECIFIED 01/25/2009  . ABDOMINAL PAIN 01/25/2009  . XERODERMA 03/10/2008  . UNS ADVRS EFF UNS RX MEDICINAL&BIOLOGICAL SBSTNC 03/10/2008  . ADJUSTMENT REACTION WITH PHYSICAL SYMPTOMS 02/14/2008  . MUSCLE SPASM 02/14/2008  . ACUTE PROSTATITIS 12/16/2007  . BREAST LUMP OR MASS, RIGHT 07/12/2007  . H/O: RCT (rotator cuff tear) 01/13/2007  . GOUT 01/08/2007    Carney Living PT DPT  03/06/2018, 1:00 PM  [facility] [address] [place], [place], [address] Phone: [phone]   Fax:  [phone]  Name: BRACKEN MOFFA MRN: [id number] Date of Birth: 1951-07-28

## 2018-03-07 ENCOUNTER — Ambulatory Visit: Payer: Medicare Other | Admitting: Physical Therapy

## 2018-03-07 DIAGNOSIS — M62838 Other muscle spasm: Secondary | ICD-10-CM

## 2018-03-07 DIAGNOSIS — M545 Low back pain, unspecified: Secondary | ICD-10-CM

## 2018-03-07 DIAGNOSIS — R262 Difficulty in walking, not elsewhere classified: Secondary | ICD-10-CM

## 2018-03-07 DIAGNOSIS — G8929 Other chronic pain: Secondary | ICD-10-CM | POA: Diagnosis not present

## 2018-03-07 DIAGNOSIS — M25561 Pain in right knee: Secondary | ICD-10-CM | POA: Diagnosis not present

## 2018-03-07 DIAGNOSIS — M25562 Pain in left knee: Secondary | ICD-10-CM | POA: Diagnosis not present

## 2018-03-07 NOTE — Therapy (Signed)
[place], [place], [address] Phone: [phone]   Fax:  [phone]  Physical Therapy Treatment  Patient Details  Name: Jose [name] MRN: [id number] Date of Birth: 05/01/1951 Referring Provider: Dr Heather Roberts    Encounter Date: 03/07/2018  PT End of Session - 03/07/18 1423    Visit Number  3    Number of Visits  18    Date for PT Re-Evaluation  04/15/18    Authorization Type  Blue cross blue shield     PT Start Time  1415    PT Stop Time  1512    PT Time Calculation (min)  57 min    Activity Tolerance  Patient tolerated treatment well    Behavior During Therapy  [facility] for tasks assessed/performed       Past Medical History:  Diagnosis Date  . Arthritis    R shoulder, great toes, hands- has gout & has injections in knees with Dr. Estanislado Pandy   . Cancer ([place])    skin ca-   . GERD (gastroesophageal reflux disease)   . History of hiatal hernia   . SDH (subdural hematoma) (HCC)     Past Surgical History:  Procedure Laterality Date  . BRAIN SURGERY  2016   evacuation of SDH  . CARPAL TUNNEL RELEASE Bilateral   . GREEN LIGHT LASER TURP (TRANSURETHRAL RESECTION OF PROSTATE  04/27/2017  . QUADRICEPS TENDON REPAIR Bilateral 06/15/2017   Procedure: REPAIR QUADRICEP TENDON;  Surgeon: Meredith Pel, MD;  Location: [facility];  Service: Orthopedics;  Laterality: Bilateral;  . SHOULDER SURGERY Right     There were no vitals filed for this visit.  Subjective Assessment - 03/07/18 1419    Subjective  Pt reports some soreness after last therapy visit. Pt has been attending the gym the past 2 days doing 6 mins on the bike to warm up and 36-48 reps on the machines. Pt reports that the soreness he feels is what he would expect to feel while workingout.     Limitations  Lifting    How long can you stand comfortably?  < 20 minutes     Diagnostic tests  nothing post op     Currently in Pain?  Other  (Comment)    Pain Score  4     Pain Descriptors / Indicators  Sore    Pain Type  Acute pain    Pain Onset  More than a month ago    Pain Frequency  Intermittent    Multiple Pain Sites  No                      [facility] Adult PT Treatment/Exercise - 03/07/18 0001      Lumbar Exercises: Stretches   Piriformis Stretch Limitations  3x20 sec hold       Lumbar Exercises: Supine   Clam  10 reps;Limitations    Clam Limitations  green band     Bent Knee Raise Limitations  x10     Bridge Limitations  x10       Knee/Hip Exercises: Stretches   Active Hamstring Stretch  20 seconds;Right;3 reps      Manual Therapy   Manual Therapy  Soft tissue mobilization;Manual Traction R glute and ITB rolled out; R lumbar trigger point release     Soft tissue mobilization  side lying STM to trigger point on right side of the lumbar spine; glut and IT band roll  Manual Traction  LAD of right lower extremity 5x30 sec hold              PT Education - 03/07/18 1425    Education provided  Yes    Education Details  review HEP; exercise technique    Person(s) Educated  Patient    Methods  Explanation;Demonstration;Tactile cues;Verbal cues    Comprehension  Verbalized understanding;Returned demonstration;Need further instruction       PT Short Term Goals - 03/05/18 0757      PT SHORT TERM GOAL #1   Title  Patient will become independent with HEP for core strength and stretching.    Baseline  pain 4/10     Time  3    Period  Weeks    Status  New    Target Date  03/26/18      PT SHORT TERM GOAL #2   Title  Patient will decrease self reported tenderness in the lumbar paraspinals with palpation.    Time  3    Period  Weeks    Status  New    Target Date  03/26/18      PT SHORT TERM GOAL #3   Title  Patient will increase lumbar extensbility by 25%.    Time  3    Period  Weeks    Status  New    Target Date  03/26/18        PT Long Term Goals - 03/05/18 0811      PT LONG  TERM GOAL #1   Title  Patient will demonstrat a 35% limitaion on FOTO.    Time  6    Period  Weeks    Status  New    Target Date  04/16/18      PT LONG TERM GOAL #2   Title  Pt will increase hip flexor strength from a 4/5 to a 5/5 in order to stand at work greater than 30 mins with no pain.    Time  6    Period  Weeks    Status  New    Target Date  04/16/18      PT LONG TERM GOAL #3   Title  Patient will increase core strength in odert o stabilize the lower back to perform daily functional activities.     Time  6    Period  Weeks    Status  New    Target Date  04/16/18            Plan - 03/07/18 1424    Clinical Impression Statement  Therapy continues to work on lumbar stabilization exercises and hip exercises. Patient responded well to manual soft tissue work and trigger release to the right lumbar region.     Rehab Potential  Good    PT Frequency  3x / week    PT Duration  8 weeks    PT Treatment/Interventions  ADLs/Self Care Home Management;Cryotherapy;Electrical Stimulation;Moist Heat;Traction;Ultrasound;Gait Scientist, forensic;Therapeutic activities;Therapeutic exercise;Patient/family education;Manual techniques;Passive range of motion;Dry needling;Taping    PT Next Visit Plan  soft tissue mobilization to the lower back; progress hip strenghtening exercises     PT Home Exercise Plan  SLR; bridge, LAQ yellow; hamstring yellow; standing march     Consulted and Agree with Plan of Care  Patient       Patient will benefit from skilled therapeutic intervention in order to improve the following deficits and impairments:  Abnormal gait, Pain, Increased muscle spasms, Decreased activity tolerance, Decreased  endurance, Decreased strength  Visit Diagnosis: Chronic right-sided low back pain without sciatica  Other muscle spasm  Difficulty in walking, not elsewhere classified     Problem List Patient Active Problem List   Diagnosis Date Noted  . Candidiasis   .  Abnormality of gait   . Hypoalbuminemia due to protein-calorie malnutrition ([place])   . History of gout   . Sleep disturbance   . Constipation   . Rupture of quadriceps tendon, right, sequela 06/19/2017  . Quadriceps tendon rupture, left, sequela 06/19/2017  . Acute blood loss anemia 06/19/2017  . Fall   . History of subdural hematoma   . Post-operative pain   . Recurrent falls   . Quadriceps tendon rupture 06/15/2017  . Primary osteoarthritis of both knees 02/28/2017  . Primary osteoarthritis of both hands 02/28/2017  . Uricacidemia 02/28/2017  . Pain in joint of left knee 02/07/2017  . Idiopathic chronic gout, unspecified site, without tophus (tophi) 11/29/2016  . HEMATURIA UNSPECIFIED 01/25/2009  . ABDOMINAL PAIN 01/25/2009  . XERODERMA 03/10/2008  . UNS ADVRS EFF UNS RX MEDICINAL&BIOLOGICAL SBSTNC 03/10/2008  . ADJUSTMENT REACTION WITH PHYSICAL SYMPTOMS 02/14/2008  . MUSCLE SPASM 02/14/2008  . ACUTE PROSTATITIS 12/16/2007  . BREAST LUMP OR MASS, RIGHT 07/12/2007  . H/O: RCT (rotator cuff tear) 01/13/2007  . GOUT 01/08/2007  Cooper Render SPT  03/07/2018   Carney Living PT DPT  03/07/2018, 11:36 PM   Student participated in treatment under direct supervision of therapist.   [place], [place], [address] Phone: [phone]   Fax:  [phone]  Name: Jose [name] MRN: [id number] Date of Birth: [birth year]

## 2018-03-09 ENCOUNTER — Other Ambulatory Visit: Payer: Self-pay | Admitting: Cardiovascular Disease

## 2018-03-11 NOTE — Telephone Encounter (Signed)
Pt has 2 different metoprolol medications on his list. Which metoprolol is the correction medication? Please advise

## 2018-03-12 ENCOUNTER — Ambulatory Visit: Payer: Medicare Other | Admitting: Physical Therapy

## 2018-03-12 DIAGNOSIS — M62838 Other muscle spasm: Secondary | ICD-10-CM

## 2018-03-12 DIAGNOSIS — R262 Difficulty in walking, not elsewhere classified: Secondary | ICD-10-CM

## 2018-03-12 DIAGNOSIS — G8929 Other chronic pain: Secondary | ICD-10-CM

## 2018-03-12 DIAGNOSIS — M25562 Pain in left knee: Secondary | ICD-10-CM | POA: Diagnosis not present

## 2018-03-12 DIAGNOSIS — M25561 Pain in right knee: Secondary | ICD-10-CM | POA: Diagnosis not present

## 2018-03-12 DIAGNOSIS — M545 Low back pain, unspecified: Secondary | ICD-10-CM

## 2018-03-13 ENCOUNTER — Encounter: Payer: Self-pay | Admitting: Physical Therapy

## 2018-03-13 ENCOUNTER — Ambulatory Visit: Payer: Medicare Other | Admitting: Physical Therapy

## 2018-03-13 DIAGNOSIS — M25561 Pain in right knee: Secondary | ICD-10-CM | POA: Diagnosis not present

## 2018-03-13 DIAGNOSIS — M62838 Other muscle spasm: Secondary | ICD-10-CM

## 2018-03-13 DIAGNOSIS — G8929 Other chronic pain: Secondary | ICD-10-CM

## 2018-03-13 DIAGNOSIS — M545 Low back pain, unspecified: Secondary | ICD-10-CM

## 2018-03-13 DIAGNOSIS — M25562 Pain in left knee: Secondary | ICD-10-CM | POA: Diagnosis not present

## 2018-03-13 DIAGNOSIS — R262 Difficulty in walking, not elsewhere classified: Secondary | ICD-10-CM | POA: Diagnosis not present

## 2018-03-13 NOTE — Therapy (Signed)
[place], [place], [address] Phone: [phone]   Fax:  [phone]  Physical Therapy Treatment  Patient Details  Name: Jose [name] MRN: [id number] Date of Birth: 06-Mar-1951 Referring Provider: Dr Heather Roberts    Encounter Date: 03/12/2018  PT End of Session - 03/12/18 1527    Visit Number  4    Number of Visits  18    Date for PT Re-Evaluation  04/15/18    Authorization Type  [employer] cross blue shield     PT Start Time  0300    PT Stop Time  0340    PT Time Calculation (min)  40 min    Activity Tolerance  Patient tolerated treatment well    Behavior During Therapy  [employer] for tasks assessed/performed       Past Medical History:  Diagnosis Date  . Arthritis    R shoulder, great toes, hands- has gout & has injections in knees with Dr. Estanislado Pandy   . Cancer ([place])    skin ca-   . GERD (gastroesophageal reflux disease)   . History of hiatal hernia   . SDH (subdural hematoma) (HCC)     Past Surgical History:  Procedure Laterality Date  . BRAIN SURGERY  2016   evacuation of SDH  . CARPAL TUNNEL RELEASE Bilateral   . GREEN LIGHT LASER TURP (TRANSURETHRAL RESECTION OF PROSTATE  04/27/2017  . QUADRICEPS TENDON REPAIR Bilateral 06/15/2017   Procedure: REPAIR QUADRICEP TENDON;  Surgeon: Meredith Pel, MD;  Location: [facility];  Service: Orthopedics;  Laterality: Bilateral;  . SHOULDER SURGERY Right     There were no vitals filed for this visit.  Subjective Assessment - 03/13/18 1149    Subjective  Pt reports that he has been to the gym and that has felt good.     Limitations  Lifting    How long can you stand comfortably?  < 20 mins     Diagnostic tests  noting post op     Pain Score  1     Pain Location  Back    Pain Orientation  Right    Pain Descriptors / Indicators  Sore    Pain Onset  More than a month ago    Pain Frequency  Intermittent    Aggravating Factors   standing up from a  lower surface     Pain Relieving Factors  ice     Effect of Pain on Daily Activities  pain with daily activites     Pain Score  4    Pain Location  Knee    Pain Orientation  Right;Left    Pain Descriptors / Indicators  Aching    Pain Type  Surgical pain    Pain Onset  More than a month ago    Pain Frequency  Constant    Aggravating Factors   standing and sitting for a long period of time     Pain Relieving Factors  ice and movement                 No data recorded       [facility] Adult PT Treatment/Exercise - 03/13/18 1329      Lumbar Exercises: Supine   Clam  10 reps;Limitations    Clam Limitations  green band     Bent Knee Raise Limitations  x10       Knee/Hip Exercises: Stretches   Active Hamstring Stretch  20 seconds;Right;3 reps  Knee/Hip Exercises: Standing   Hip Flexion Limitations  x10    Abduction Limitations  x10    Extension Limitations  x10      Knee/Hip Exercises: Supine   Bridges Limitations  x10      Manual Therapy   Manual Therapy  Soft tissue mobilization;Manual Traction R glute and ITB rolled out; R lumbar trigger point release     Soft tissue mobilization  side lying STM to trigger point on right side of the lumbar spine; glut and IT band roll     Manual Traction  LAD of right lower extremity 5x30 sec hold        Trigger Point Dry Needling - 03/13/18 1319    Consent Given?  Yes    Education Handout Provided  Yes    Muscles Treated Upper Body  Longissimus    Longissimus Response  Twitch response elicited           PT Education - 03/12/18 1526    Education provided  Yes    Education Details  reviewed benefits and risks of TPDN     Person(s) Educated  Patient    Methods  Explanation;Demonstration;Tactile cues;Verbal cues    Comprehension  Verbalized understanding;Returned demonstration;Verbal cues required;Tactile cues required       PT Short Term Goals - 03/05/18 0757      PT SHORT TERM GOAL #1   Title  Patient will  become independent with HEP for core strength and stretching.    Baseline  pain 4/10     Time  3    Period  Weeks    Status  New    Target Date  03/26/18      PT SHORT TERM GOAL #2   Title  Patient will decrease self reported tenderness in the lumbar paraspinals with palpation.    Time  3    Period  Weeks    Status  New    Target Date  03/26/18      PT SHORT TERM GOAL #3   Title  Patient will increase lumbar extensbility by 25%.    Time  3    Period  Weeks    Status  New    Target Date  03/26/18        PT Long Term Goals - 03/05/18 0811      PT LONG TERM GOAL #1   Title  Patient will demonstrat a 35% limitaion on FOTO.    Time  6    Period  Weeks    Status  New    Target Date  04/16/18      PT LONG TERM GOAL #2   Title  Pt will increase hip flexor strength from a 4/5 to a 5/5 in order to stand at work greater than 30 mins with no pain.    Time  6    Period  Weeks    Status  New    Target Date  04/16/18      PT LONG TERM GOAL #3   Title  Patient will increase core strength in odert o stabilize the lower back to perform daily functional activities.     Time  6    Period  Weeks    Status  New    Target Date  04/16/18            Plan - 03/12/18 1532    Clinical Impression Statement  Pt recieved dry needling to three areas in the lumbar paraspinals after  skill palpation found trigger points in the lower back. Pt tolerated dry needling well. Therapy progressed hip strengthening with standing hip exercises.     Clinical Presentation  Stable    Rehab Potential  Good    PT Frequency  3x / week    PT Duration  8 weeks    PT Treatment/Interventions  ADLs/Self Care Home Management;Cryotherapy;Electrical Stimulation;Moist Heat;Traction;Ultrasound;Gait Scientist, forensic;Therapeutic activities;Therapeutic exercise;Patient/family education;Manual techniques;Passive range of motion;Dry needling;Taping    PT Next Visit Plan  soft tissue mobilization to the lower back;  progress hip strenghtening exercises; PA mobilizations L3-L5    PT Home Exercise Plan  SLR; bridge, LAQ yellow; hamstring yellow; standing march     Consulted and Agree with Plan of Care  Patient       Patient will benefit from skilled therapeutic intervention in order to improve the following deficits and impairments:  Abnormal gait, Pain, Increased muscle spasms, Decreased activity tolerance, Decreased endurance, Decreased strength  Visit Diagnosis: Chronic right-sided low back pain without sciatica  Other muscle spasm  Difficulty in walking, not elsewhere classified     Problem List Patient Active Problem List   Diagnosis Date Noted  . Candidiasis   . Abnormality of gait   . Hypoalbuminemia due to protein-calorie malnutrition ([place])   . History of gout   . Sleep disturbance   . Constipation   . Rupture of quadriceps tendon, right, sequela 06/19/2017  . Quadriceps tendon rupture, left, sequela 06/19/2017  . Acute blood loss anemia 06/19/2017  . Fall   . History of subdural hematoma   . Post-operative pain   . Recurrent falls   . Quadriceps tendon rupture 06/15/2017  . Primary osteoarthritis of both knees 02/28/2017  . Primary osteoarthritis of both hands 02/28/2017  . Uricacidemia 02/28/2017  . Pain in joint of left knee 02/07/2017  . Idiopathic chronic gout, unspecified site, without tophus (tophi) 11/29/2016  . HEMATURIA UNSPECIFIED 01/25/2009  . ABDOMINAL PAIN 01/25/2009  . XERODERMA 03/10/2008  . UNS ADVRS EFF UNS RX MEDICINAL&BIOLOGICAL SBSTNC 03/10/2008  . ADJUSTMENT REACTION WITH PHYSICAL SYMPTOMS 02/14/2008  . MUSCLE SPASM 02/14/2008  . ACUTE PROSTATITIS 12/16/2007  . BREAST LUMP OR MASS, RIGHT 07/12/2007  . H/O: RCT (rotator cuff tear) 01/13/2007  . GOUT 01/08/2007   Cooper Render SPT  03/13/2018   During this treatment session, the therapist was present, participating in and directing the treatment.  Carney Living PT DPT  03/13/2018, 5:46 PM  [facility] [address] [place], [place], [address] Phone: [phone]   Fax:  [phone]  Name: Jose [name] MRN: [id number] Date of Birth: 1951/02/18

## 2018-03-14 ENCOUNTER — Encounter: Payer: Self-pay | Admitting: Physical Therapy

## 2018-03-14 ENCOUNTER — Ambulatory Visit: Payer: Medicare Other | Admitting: Physical Therapy

## 2018-03-14 DIAGNOSIS — M545 Low back pain, unspecified: Secondary | ICD-10-CM

## 2018-03-14 DIAGNOSIS — M62838 Other muscle spasm: Secondary | ICD-10-CM | POA: Diagnosis not present

## 2018-03-14 DIAGNOSIS — G8929 Other chronic pain: Secondary | ICD-10-CM | POA: Diagnosis not present

## 2018-03-14 DIAGNOSIS — M25562 Pain in left knee: Secondary | ICD-10-CM

## 2018-03-14 DIAGNOSIS — F438 Other reactions to severe stress: Secondary | ICD-10-CM | POA: Diagnosis not present

## 2018-03-14 DIAGNOSIS — M25561 Pain in right knee: Secondary | ICD-10-CM | POA: Diagnosis not present

## 2018-03-14 DIAGNOSIS — R262 Difficulty in walking, not elsewhere classified: Secondary | ICD-10-CM

## 2018-03-14 NOTE — Procedures (Signed)
Lumbosacral Transforaminal Epidural Steroid Injection - Sub-Pedicular Approach with Fluoroscopic Guidance  Patient: Jose [name]      Date of Birth: 11-Mar-1951 MRN: [id number] PCP: [name], MD      Visit Date: 02/27/2018   Universal Protocol:    Date/Time: 02/27/2018  Consent Given By: the patient  Position: PRONE  Additional Comments: Vital signs were monitored before and after the procedure. Patient was prepped and draped in the usual sterile fashion. The correct patient, procedure, and site was verified.   Injection Procedure Details:  Procedure Site One Meds Administered:  Meds ordered this encounter  Medications  . betamethasone acetate-betamethasone sodium phosphate (CELESTONE) injection 12 mg    Laterality: Right  Location/Site:  L3-L4 L4-L5  Needle size: 22 G  Needle type: Spinal  Needle Placement: Transforaminal  Findings:    -Comments: Excellent flow of contrast along the nerve and into the epidural space.  Procedure Details: After squaring off the end-plates to get a true AP view, the C-arm was positioned so that an oblique view of the foramen as noted above was visualized. The target area is just inferior to the "nose of the scotty dog" or sub pedicular. The soft tissues overlying this structure were infiltrated with 2-3 ml. of 1% Lidocaine without Epinephrine.  The spinal needle was inserted toward the target using a "trajectory" view along the fluoroscope beam.  Under AP and lateral visualization, the needle was advanced so it did not puncture dura and was located close the 6 O'Clock position of the pedical in AP tracterory. Biplanar projections were used to confirm position. Aspiration was confirmed to be negative for CSF and/or blood. A 1-2 ml. volume of Isovue-250 was injected and flow of contrast was noted at each level. Radiographs were obtained for documentation purposes.   After attaining the desired flow of contrast documented above, a  0.5 to 1.0 ml test dose of 0.25% Marcaine was injected into each respective transforaminal space.  The patient was observed for 90 seconds post injection.  After no sensory deficits were reported, and normal lower extremity motor function was noted,   the above injectate was administered so that equal amounts of the injectate were placed at each foramen (level) into the transforaminal epidural space.   Additional Comments:  The patient tolerated the procedure well Dressing: Band-Aid    Post-procedure details: Patient was observed during the procedure. Post-procedure instructions were reviewed.  Patient left the clinic in stable condition.

## 2018-03-14 NOTE — Therapy (Addendum)
[place], [place], [address] Phone: [phone]   Fax:  [phone]  Physical Therapy Treatment  Patient Details  Name: Jose [name] MRN: [id number] Date of Birth: 1951-11-15 Referring Provider: Dr Heather Roberts    Encounter Date: 03/13/2018  PT End of Session - 03/14/18 1256    Visit Number  5    Number of Visits  18    Date for PT Re-Evaluation  04/15/18    Authorization Type  Blue cross blue shield     PT Start Time  1145    PT Stop Time  1228    PT Time Calculation (min)  43 min    Activity Tolerance  Patient tolerated treatment well    Behavior During Therapy  [facility] for tasks assessed/performed       Past Medical History:  Diagnosis Date  . Arthritis    R shoulder, great toes, hands- has gout & has injections in knees with Dr. Estanislado Pandy   . Cancer ([place])    skin ca-   . GERD (gastroesophageal reflux disease)   . History of hiatal hernia   . SDH (subdural hematoma) (HCC)     Past Surgical History:  Procedure Laterality Date  . BRAIN SURGERY  2016   evacuation of SDH  . CARPAL TUNNEL RELEASE Bilateral   . GREEN LIGHT LASER TURP (TRANSURETHRAL RESECTION OF PROSTATE  04/27/2017  . QUADRICEPS TENDON REPAIR Bilateral 06/15/2017   Procedure: REPAIR QUADRICEP TENDON;  Surgeon: Meredith Pel, MD;  Location: [place];  Service: Orthopedics;  Laterality: Bilateral;  . SHOULDER SURGERY Right     There were no vitals filed for this visit.  Subjective Assessment - 03/13/18 1149    Subjective  Pt reports that he has been to the gym and that has felt good. Pt reports mild muscle soreness after last PT visit. Pt's back pain is a 1/10.     Limitations  Lifting    How long can you stand comfortably?  < 20 mins     Diagnostic tests  noting post op     Pain Score  1     Pain Location  Back    Pain Orientation  Right    Pain Descriptors / Indicators  Sore    Pain Onset  More than a month ago     Pain Frequency  Intermittent    Aggravating Factors   standing up from a lower surface     Pain Relieving Factors  ice     Effect of Pain on Daily Activities  pain with daily activites     Pain Score  4    Pain Location  Knee    Pain Orientation  Right;Left    Pain Descriptors / Indicators  Aching    Pain Type  Surgical pain    Pain Onset  More than a month ago    Pain Frequency  Constant    Aggravating Factors   standing and sitting for a long period of time     Pain Relieving Factors  ice and movement                 No data recorded       [facility] Adult PT Treatment/Exercise - 03/14/18 0001      Lumbar Exercises: Standing   Other Standing Lumbar Exercises  standing extension red 2x10; scap retractions 2x10 red       Lumbar Exercises: Supine   Clam  10 reps;Limitations    Clam Limitations  blue band     Bridge Limitations  2x10     Other Supine Lumbar Exercises  SLR      Knee/Hip Exercises: Stretches   Active Hamstring Stretch  20 seconds;Right;3 reps      Knee/Hip Exercises: Standing   Hip Flexion Limitations  x10    Abduction Limitations  x10    Extension Limitations  x10      Knee/Hip Exercises: Supine   Bridges Limitations  x10    Straight Leg Raises  Both;2 sets;10 reps      Manual Therapy   Manual Therapy  Soft tissue mobilization;Manual Traction R glute and ITB rolled out; R lumbar trigger point release     Soft tissue mobilization  side lying grade 3-4 PA glides to L3-L5    Manual Traction  LAD of right lower extremity 5x30 sec hold        Trigger Point Dry Needling - 03/13/18 1319    Consent Given?  Yes    Education Handout Provided  Yes    Muscles Treated Upper Body  Longissimus    Longissimus Response  Twitch response elicited           PT Education - 03/14/18 1255    Education Details  Anatomy of spinal segments; exercise technique     Person(s) Educated  Patient    Methods  Explanation;Demonstration;Tactile cues;Verbal cues     Comprehension  Verbalized understanding;Need further instruction;Returned demonstration       PT Short Term Goals - 03/05/18 0757      PT SHORT TERM GOAL #1   Title  Patient will become independent with HEP for core strength and stretching.    Baseline  pain 4/10     Time  3    Period  Weeks    Status  New    Target Date  03/26/18      PT SHORT TERM GOAL #2   Title  Patient will decrease self reported tenderness in the lumbar paraspinals with palpation.    Time  3    Period  Weeks    Status  New    Target Date  03/26/18      PT SHORT TERM GOAL #3   Title  Patient will increase lumbar extensbility by 25%.    Time  3    Period  Weeks    Status  New    Target Date  03/26/18        PT Long Term Goals - 03/05/18 0811      PT LONG TERM GOAL #1   Title  Patient will demonstrat a 35% limitaion on FOTO.    Time  6    Period  Weeks    Status  New    Target Date  04/16/18      PT LONG TERM GOAL #2   Title  Pt will increase hip flexor strength from a 4/5 to a 5/5 in order to stand at work greater than 30 mins with no pain.    Time  6    Period  Weeks    Status  New    Target Date  04/16/18      PT LONG TERM GOAL #3   Title  Patient will increase core strength in odert o stabilize the lower back to perform daily functional activities.     Time  6    Period  Weeks    Status  New  Target Date  04/16/18            Plan - 03/14/18 1257    Clinical Impression Statement  Therapy assessed pt's spinal mobility and found decreased spinal mobility of L3-L4. Pain was increase when assessing L3 mobility. Therapy provided grades 3-4 PA mobilizations to these lumbar segments. Pt tolerated manual treatment well. Therapy continues to progress pt's hip strengthening exercies.     Clinical Presentation  Stable    Clinical Decision Making  Low    Rehab Potential  Good    PT Frequency  3x / week    PT Duration  8 weeks    PT Treatment/Interventions  ADLs/Self Care Home  Management;Cryotherapy;Electrical Stimulation;Moist Heat;Traction;Ultrasound;Gait Scientist, forensic;Therapeutic activities;Therapeutic exercise;Patient/family education;Manual techniques;Passive range of motion;Dry needling;Taping    PT Next Visit Plan  soft tissue mobilization to the lower back; progress hip strenghtening exercises; PA mobilizations L3-L5    PT Home Exercise Plan  SLR; bridge, clamshell; standing hip abduction and extension; standing march     Consulted and Agree with Plan of Care  Patient       Patient will benefit from skilled therapeutic intervention in order to improve the following deficits and impairments:  Abnormal gait, Pain, Increased muscle spasms, Decreased activity tolerance, Decreased endurance, Decreased strength  Visit Diagnosis: Chronic right-sided low back pain without sciatica  Other muscle spasm  Difficulty in walking, not elsewhere classified     Problem List Patient Active Problem List   Diagnosis Date Noted  . Candidiasis   . Abnormality of gait   . Hypoalbuminemia due to protein-calorie malnutrition ([place])   . History of gout   . Sleep disturbance   . Constipation   . Rupture of quadriceps tendon, right, sequela 06/19/2017  . Quadriceps tendon rupture, left, sequela 06/19/2017  . Acute blood loss anemia 06/19/2017  . Fall   . History of subdural hematoma   . Post-operative pain   . Recurrent falls   . Quadriceps tendon rupture 06/15/2017  . Primary osteoarthritis of both knees 02/28/2017  . Primary osteoarthritis of both hands 02/28/2017  . Uricacidemia 02/28/2017  . Pain in joint of left knee 02/07/2017  . Idiopathic chronic gout, unspecified site, without tophus (tophi) 11/29/2016  . HEMATURIA UNSPECIFIED 01/25/2009  . ABDOMINAL PAIN 01/25/2009  . XERODERMA 03/10/2008  . UNS ADVRS EFF UNS RX MEDICINAL&BIOLOGICAL SBSTNC 03/10/2008  . ADJUSTMENT REACTION WITH PHYSICAL SYMPTOMS 02/14/2008  . MUSCLE SPASM 02/14/2008  . ACUTE  PROSTATITIS 12/16/2007  . BREAST LUMP OR MASS, RIGHT 07/12/2007  . H/O: RCT (rotator cuff tear) 01/13/2007  . GOUT 01/08/2007   Carolyne Littles PT DPT  03/14/2018   During this treatment session, the therapist was present, participating in and directing the treatment.   Cooper Render 03/14/2018, 1:05 PM  [facility] [address] [place], [place], [address] Phone: [phone]   Fax:  [phone]  Name: Jose [name] MRN: [id number] Date of Birth: [birth year]

## 2018-03-14 NOTE — Progress Notes (Signed)
Jose [name] - 67 y.o. male MRN [id number]  Date of birth: [birth year]  Office Visit Note: Visit Date: 02/27/2018 PCP: [name], MD Referred by: [name], MD  Subjective: Chief Complaint  Patient presents with  . Lower Back - Pain  . Right Hip - Pain   HPI: Jose [name] is a 67 year old gentleman referred here by Dr. Marlou Sa in our office for severe right hip and leg pain.  The patient has large disc herniation which is foraminal extraforaminal somewhat lateral recess with probably free fragment extraforaminal E.  This is at L3-4.  This is consistent with his symptoms.  He has not gotten relief with conservative care to this point he is in quite a bit of pain.  We are going to complete a right L3 and L4 transforaminal epidural steroid injection.  We discussed at length the natural history of these findings and possible surgical issues as well as what to look for with the injection.  As a side note he has an interesting story of being recruited to play football at the [employer] which is where I graduated from some had some interesting stories to tell.   ROS Otherwise per HPI.  Assessment & Plan: Visit Diagnoses:  1. Lumbar radiculopathy   2. Radiculopathy due to lumbar intervertebral disc disorder     Plan: No additional findings.   Meds & Orders:  Meds ordered this encounter  Medications  . betamethasone acetate-betamethasone sodium phosphate (CELESTONE) injection 12 mg    Orders Placed This Encounter  Procedures  . XR C-ARM NO REPORT  . Epidural Steroid injection    Follow-up: Return if symptoms worsen or fail to improve.   Procedures: No procedures performed  Lumbosacral Transforaminal Epidural Steroid Injection - Sub-Pedicular Approach with Fluoroscopic Guidance  Patient: Jose [name]      Date of Birth: 01-19-1951 MRN: [id number] PCP: [name], MD      Visit Date: 02/27/2018   Universal Protocol:    Date/Time:  02/27/2018  Consent Given By: the patient  Position: PRONE  Additional Comments: Vital signs were monitored before and after the procedure. Patient was prepped and draped in the usual sterile fashion. The correct patient, procedure, and site was verified.   Injection Procedure Details:  Procedure Site One Meds Administered:  Meds ordered this encounter  Medications  . betamethasone acetate-betamethasone sodium phosphate (CELESTONE) injection 12 mg    Laterality: Right  Location/Site:  L3-L4 L4-L5  Needle size: 22 G  Needle type: Spinal  Needle Placement: Transforaminal  Findings:    -Comments: Excellent flow of contrast along the nerve and into the epidural space.  Procedure Details: After squaring off the end-plates to get a true AP view, the C-arm was positioned so that an oblique view of the foramen as noted above was visualized. The target area is just inferior to the "nose of the scotty dog" or sub pedicular. The soft tissues overlying this structure were infiltrated with 2-3 ml. of 1% Lidocaine without Epinephrine.  The spinal needle was inserted toward the target using a "trajectory" view along the fluoroscope beam.  Under AP and lateral visualization, the needle was advanced so it did not puncture dura and was located close the 6 O'Clock position of the pedical in AP tracterory. Biplanar projections were used to confirm position. Aspiration was confirmed to be negative for CSF and/or blood. A 1-2 ml. volume of Isovue-250 was injected and flow of contrast was noted at each level. Radiographs were obtained  for documentation purposes.   After attaining the desired flow of contrast documented above, a 0.5 to 1.0 ml test dose of 0.25% Marcaine was injected into each respective transforaminal space.  The patient was observed for 90 seconds post injection.  After no sensory deficits were reported, and normal lower extremity motor function was noted,   the above injectate was  administered so that equal amounts of the injectate were placed at each foramen (level) into the transforaminal epidural space.   Additional Comments:  The patient tolerated the procedure well Dressing: Band-Aid    Post-procedure details: Patient was observed during the procedure. Post-procedure instructions were reviewed.  Patient left the clinic in stable condition.    Clinical History: MRI LUMBAR SPINE WITHOUT CONTRAST  TECHNIQUE: Multiplanar, multisequence MR imaging of the lumbar spine was performed. No intravenous contrast was administered.  COMPARISON:  Lumbar spine radiographs February 12, 2018  FINDINGS: SEGMENTATION: For the purposes of this report, the last well-formed intervertebral disc is reported as L5-S1.  ALIGNMENT: Maintained lumbar lordosis. Grade 1 L5-S1 anterolisthesis. Chronic bilateral L5 pars interarticularis defects.  VERTEBRAE:Vertebral bodies are intact. Severe L4-5 and L5-S1 disc height loss with moderate to severe subacute discogenic endplate changes W5-4. Moderate acute discogenic endplate changes O2-V0. Mild acute discogenic endplate changes J5-0. Multilevel mild disc desiccation and mild subacute on chronic discogenic endplate changes. Scattered small Schmorl's nodes severe in a gist.  CONUS MEDULLARIS AND CAUDA EQUINA: Conus medullaris terminates at L1 and demonstrates normal morphology and signal characteristics. Cauda equina is normal.  PARASPINAL AND OTHER SOFT TISSUES: Nonacute. 8 mm T2 bright cyst upper pole LEFT kidney.  DISC LEVELS:  T11-12 and T12-L1: Annular bulging without canal stenosis nor neural foraminal narrowing.  L1-2, L2-3: No disc bulge, canal stenosis nor neural foraminal narrowing. Mild LEFT L2-3 facet arthropathy.  L3-4: Large RIGHT subarticular to extraforaminal disc extrusion, difficult to quantify due to slice selection with surrounding inflammatory changes. Potential free fragment along the  superior RIGHT extraforaminal space. Posteriorly displaced exited RIGHT L3 nerve. Partially effaced RIGHT lateral recess posteriorly displacing the traversing RIGHT L4 nerve. Mild facet arthropathy and ligamentum flavum redundancy without canal stenosis. Moderate RIGHT neural foraminal narrowing with extruded disc within superior foramen. No LEFT neural foraminal narrowing.  L4-5: Small broad-based disc osteophyte complex asymmetric to the RIGHT. Moderate facet arthropathy and ligamentum flavum redundancy without canal stenosis. Mild to moderate bilateral neural foraminal narrowing.  L5-S1: Anterolisthesis. Unroofing of the disc with annular bulging. Minimal facet arthropathy without canal stenosis. Moderate RIGHT, severe LEFT neural foraminal narrowing.  IMPRESSION: 1. Large RIGHT L3-4 subarticular two extraforaminal disc extrusion with suspected extraforaminal free fragment. Exited RIGHT L3 and traversing RIGHT L4 nerve impingement. 2. Grade 1 L5-S1 anterolisthesis on the basis of chronic bilateral L5 pars interarticularis defects. 3. No canal stenosis. Neural foraminal narrowing L3-4 through L5-S1: Severe on the LEFT at L5-S1.   Electronically Signed   By: Elon Alas M.D.   On: 02/22/2018 20:33   He reports that he has been smoking cigars.  He has never used smokeless tobacco.  Recent Labs    09/04/17 0936  HGBA1C 5.5  LABURIC 5.0    Objective:  VS:  HT:    WT:   BMI:     BP:(!) 129/97  HR:83bpm  TEMP:98.1 F (36.7 C)(Oral)  RESP:100 % Physical Exam  Ortho Exam Imaging: No results found.  Past Medical/Family/Surgical/Social History: Medications & Allergies reviewed per EMR, new medications updated. Patient Active Problem List   Diagnosis Date  Noted  . Candidiasis   . Abnormality of gait   . Hypoalbuminemia due to protein-calorie malnutrition ([facility])   . History of gout   . Sleep disturbance   . Constipation   . Rupture of quadriceps tendon,  right, sequela 06/19/2017  . Quadriceps tendon rupture, left, sequela 06/19/2017  . Acute blood loss anemia 06/19/2017  . Fall   . History of subdural hematoma   . Post-operative pain   . Recurrent falls   . Quadriceps tendon rupture 06/15/2017  . Primary osteoarthritis of both knees 02/28/2017  . Primary osteoarthritis of both hands 02/28/2017  . Uricacidemia 02/28/2017  . Pain in joint of left knee 02/07/2017  . Idiopathic chronic gout, unspecified site, without tophus (tophi) 11/29/2016  . HEMATURIA UNSPECIFIED 01/25/2009  . ABDOMINAL PAIN 01/25/2009  . XERODERMA 03/10/2008  . UNS ADVRS EFF UNS RX MEDICINAL&BIOLOGICAL SBSTNC 03/10/2008  . ADJUSTMENT REACTION WITH PHYSICAL SYMPTOMS 02/14/2008  . MUSCLE SPASM 02/14/2008  . ACUTE PROSTATITIS 12/16/2007  . BREAST LUMP OR MASS, RIGHT 07/12/2007  . H/O: RCT (rotator cuff tear) 01/13/2007  . GOUT 01/08/2007   Past Medical History:  Diagnosis Date  . Arthritis    R shoulder, great toes, hands- has gout & has injections in knees with Dr. Estanislado Pandy   . Cancer ([facility])    skin ca-   . GERD (gastroesophageal reflux disease)   . History of hiatal hernia   . SDH (subdural hematoma) ([facility])    Family History  Problem Relation Age of Onset  . Parkinson's disease Mother   . Heart disease Father   . Parkinson's disease Brother   . Diabetes Brother    Past Surgical History:  Procedure Laterality Date  . BRAIN SURGERY  2016   evacuation of SDH  . CARPAL TUNNEL RELEASE Bilateral   . GREEN LIGHT LASER TURP (TRANSURETHRAL RESECTION OF PROSTATE  04/27/2017  . QUADRICEPS TENDON REPAIR Bilateral 06/15/2017   Procedure: REPAIR QUADRICEP TENDON;  Surgeon: Meredith Pel, MD;  Location: [facility];  Service: Orthopedics;  Laterality: Bilateral;  . SHOULDER SURGERY Right    Social History   Occupational History  . Not on file  Tobacco Use  . Smoking status: Current Every Day Smoker    Types: Cigars  . Smokeless tobacco: Never Used  .  Tobacco comment: celebration use   Substance and Sexual Activity  . Alcohol use: Yes    Alcohol/week: 1.8 oz    Types: 2 Glasses of wine, 1 Shots of liquor per week    Comment: 2 drinks per day  . Drug use: No  . Sexual activity: Not on file

## 2018-03-15 NOTE — Therapy (Signed)
[place], [place], [address] Phone: [phone]   Fax:  [phone]  Physical Therapy Treatment  Patient Details  Name: Jose [name] MRN: [id number] Date of Birth: Apr 13, 1951 Referring Provider: Dr Heather Roberts    Encounter Date: 03/14/2018  PT End of Session - 03/15/18 0823    Visit Number  6    Number of Visits  18    Date for PT Re-Evaluation  04/15/18    Authorization Type  [employer] cross blue shield     PT Start Time  1500    PT Stop Time  1541    PT Time Calculation (min)  41 min    Activity Tolerance  Patient tolerated treatment well    Behavior During Therapy  [facility] for tasks assessed/performed       Past Medical History:  Diagnosis Date  . Arthritis    R shoulder, great toes, hands- has gout & has injections in knees with Dr. Estanislado Pandy   . Cancer ([facility])    skin ca-   . GERD (gastroesophageal reflux disease)   . History of hiatal hernia   . SDH (subdural hematoma) ([facility])     Past Surgical History:  Procedure Laterality Date  . BRAIN SURGERY  2016   evacuation of SDH  . CARPAL TUNNEL RELEASE Bilateral   . GREEN LIGHT LASER TURP (TRANSURETHRAL RESECTION OF PROSTATE  04/27/2017  . QUADRICEPS TENDON REPAIR Bilateral 06/15/2017   Procedure: REPAIR QUADRICEP TENDON;  Surgeon: Meredith Pel, MD;  Location: [facility];  Service: Orthopedics;  Laterality: Bilateral;  . SHOULDER SURGERY Right     There were no vitals filed for this visit.  Subjective Assessment - 03/14/18 1512    Subjective  Patient reports bilateral knee soreness but overall his back is feeling good. He feels like the back is a little more swollen when he is lying flat at the gym.     How long can you stand comfortably?  < 20 mins     Diagnostic tests  noting post op     Currently in Pain?  Yes    Pain Score  4     Pain Location  Knee    Pain Orientation  Left;Right    Pain Onset  More than a month ago    Pain  Frequency  Intermittent    Aggravating Factors   standing up from a low surface     Effect of Pain on Daily Activities  pain with daily activity     Multiple Pain Sites  No    Pain Onset  More than a month ago    Aggravating Factors   standing and sitting for long periods of time     Pain Relieving Factors  ice and movement                 No data recorded       [facility] Adult PT Treatment/Exercise - 03/15/18 0001      Lumbar Exercises: Standing   Other Standing Lumbar Exercises  standing extension red 2x10; scap retractions 2x10 red       Lumbar Exercises: Supine   Clam  10 reps;Limitations    Clam Limitations  blue band     Bridge Limitations  2x10     Other Supine Lumbar Exercises  SLR      Knee/Hip Exercises: Stretches   Active Hamstring Stretch  20 seconds;Right;3 reps      Knee/Hip Exercises:  Standing   Hip Flexion Limitations  x10 1 lb     Abduction Limitations  x10 1lb     Extension Limitations  x10 1 lb       Knee/Hip Exercises: Supine   Bridges Limitations  x10    Straight Leg Raises  Both;2 sets;10 reps      Manual Therapy   Manual Therapy  Soft tissue mobilization;Manual Traction R glute and ITB rolled out; R lumbar trigger point release     Manual therapy comments  IASTM to left lumbar spine     Soft tissue mobilization  side lying grade 3-4 PA glides to L3-L5    Manual Traction  LAD of right lower extremity 5x30 sec hold              PT Education - 03/14/18 1524    Education provided  Yes    Education Details  reviewed exercises     Person(s) Educated  Patient    Methods  Explanation;Demonstration;Tactile cues;Verbal cues    Comprehension  Verbalized understanding;Returned demonstration;Verbal cues required;Tactile cues required       PT Short Term Goals - 03/05/18 0757      PT SHORT TERM GOAL #1   Title  Patient will become independent with HEP for core strength and stretching.    Baseline  pain 4/10     Time  3    Period  Weeks     Status  New    Target Date  03/26/18      PT SHORT TERM GOAL #2   Title  Patient will decrease self reported tenderness in the lumbar paraspinals with palpation.    Time  3    Period  Weeks    Status  New    Target Date  03/26/18      PT SHORT TERM GOAL #3   Title  Patient will increase lumbar extensbility by 25%.    Time  3    Period  Weeks    Status  New    Target Date  03/26/18        PT Long Term Goals - 03/05/18 0811      PT LONG TERM GOAL #1   Title  Patient will demonstrat a 35% limitaion on FOTO.    Time  6    Period  Weeks    Status  New    Target Date  04/16/18      PT LONG TERM GOAL #2   Title  Pt will increase hip flexor strength from a 4/5 to a 5/5 in order to stand at work greater than 30 mins with no pain.    Time  6    Period  Weeks    Status  New    Target Date  04/16/18      PT LONG TERM GOAL #3   Title  Patient will increase core strength in odert o stabilize the lower back to perform daily functional activities.     Time  6    Period  Weeks    Status  New    Target Date  04/16/18            Plan - 03/15/18 0825    Clinical Impression Statement  Patient continues to tolerate exercises well. he is limited somewhat by his knees . Overall he is doing well. 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     Clinical Presentation  Stable    Clinical Decision Making  Low    Rehab Potential  Good    PT Frequency  3x / week    PT Duration  8 weeks    PT Treatment/Interventions  ADLs/Self Care Home Management;Cryotherapy;Electrical Stimulation;Moist Heat;Traction;Ultrasound;Gait Scientist, forensic;Therapeutic activities;Therapeutic exercise;Patient/family education;Manual techniques;Passive range of motion;Dry needling;Taping    PT Next Visit Plan  soft tissue mobilization to the lower back; progress hip strenghtening exercises; PA mobilizations L3-L5    PT Home Exercise Plan  SLR; bridge, clamshell; standing hip abduction and  extension; standing march     Consulted and Agree with Plan of Care  Patient       Patient will benefit from skilled therapeutic intervention in order to improve the following deficits and impairments:  Abnormal gait, Pain, Increased muscle spasms, Decreased activity tolerance, Decreased endurance, Decreased strength  Visit Diagnosis: Chronic right-sided low back pain without sciatica  Other muscle spasm  Difficulty in walking, not elsewhere classified  Acute pain of right knee  Acute pain of left knee     Problem List Patient Active Problem List   Diagnosis Date Noted  . Candidiasis   . Abnormality of gait   . Hypoalbuminemia due to protein-calorie malnutrition ([facility])   . History of gout   . Sleep disturbance   . Constipation   . Rupture of quadriceps tendon, right, sequela 06/19/2017  . Quadriceps tendon rupture, left, sequela 06/19/2017  . Acute blood loss anemia 06/19/2017  . Fall   . History of subdural hematoma   . Post-operative pain   . Recurrent falls   . Quadriceps tendon rupture 06/15/2017  . Primary osteoarthritis of both knees 02/28/2017  . Primary osteoarthritis of both hands 02/28/2017  . Uricacidemia 02/28/2017  . Pain in joint of left knee 02/07/2017  . Idiopathic chronic gout, unspecified site, without tophus (tophi) 11/29/2016  . HEMATURIA UNSPECIFIED 01/25/2009  . ABDOMINAL PAIN 01/25/2009  . XERODERMA 03/10/2008  . UNS ADVRS EFF UNS RX MEDICINAL&BIOLOGICAL SBSTNC 03/10/2008  . ADJUSTMENT REACTION WITH PHYSICAL SYMPTOMS 02/14/2008  . MUSCLE SPASM 02/14/2008  . ACUTE PROSTATITIS 12/16/2007  . BREAST LUMP OR MASS, RIGHT 07/12/2007  . H/O: RCT (rotator cuff tear) 01/13/2007  . GOUT 01/08/2007    Carney Living PT DPT  03/15/2018, 8:31 AM   Cooper Render SPT  03/15/2018   [facility] [place], [place], [address] Phone: [phone]   Fax:  [phone]  Name: Jose [name] MRN: [id number] Date of Birth: [birth year]

## 2018-03-18 DIAGNOSIS — Z08 Encounter for follow-up examination after completed treatment for malignant neoplasm: Secondary | ICD-10-CM | POA: Diagnosis not present

## 2018-03-18 DIAGNOSIS — L820 Inflamed seborrheic keratosis: Secondary | ICD-10-CM | POA: Diagnosis not present

## 2018-03-18 DIAGNOSIS — D485 Neoplasm of uncertain behavior of skin: Secondary | ICD-10-CM | POA: Diagnosis not present

## 2018-03-18 DIAGNOSIS — C4441 Basal cell carcinoma of skin of scalp and neck: Secondary | ICD-10-CM | POA: Diagnosis not present

## 2018-03-18 DIAGNOSIS — Z85828 Personal history of other malignant neoplasm of skin: Secondary | ICD-10-CM | POA: Diagnosis not present

## 2018-03-18 DIAGNOSIS — D0461 Carcinoma in situ of skin of right upper limb, including shoulder: Secondary | ICD-10-CM | POA: Diagnosis not present

## 2018-03-18 DIAGNOSIS — N401 Enlarged prostate with lower urinary tract symptoms: Secondary | ICD-10-CM | POA: Diagnosis not present

## 2018-03-18 DIAGNOSIS — D225 Melanocytic nevi of trunk: Secondary | ICD-10-CM | POA: Diagnosis not present

## 2018-03-18 DIAGNOSIS — L570 Actinic keratosis: Secondary | ICD-10-CM | POA: Diagnosis not present

## 2018-03-18 NOTE — Progress Notes (Signed)
Office Visit Note  Patient: Jose [name]             Date of Birth: [birth year]           MRN: [id number]             PCP: [name], MD Referring: [name], MD Visit Date: 03/29/2018 Occupation: @GUAROCC @    Subjective:  Other (BIL knee pain )   History of Present Illness: Jose [name] is a 67 y.o. male with history of gout and osteoarthritis.  He has not had a gout flare in a long time.  He continues to have some stiffness in his hands and knee joints.  He has had bilateral quadriceps repair which is still causes discomfort.  The discomfort from neuropathy is tolerable.  Activities of Daily Living:  Patient reports morning stiffness for 2 minutes.   Patient Denies nocturnal pain.  Difficulty dressing/grooming: Denies Difficulty climbing stairs: Reports Difficulty getting out of chair: Denies Difficulty using hands for taps, buttons, cutlery, and/or writing: Denies   Review of Systems  Constitutional: Positive for fatigue. Negative for night sweats.  HENT: Negative for mouth sores, mouth dryness and nose dryness.   Eyes: Negative for redness and dryness.  Respiratory: Negative for shortness of breath and difficulty breathing.   Cardiovascular: Negative for chest pain, palpitations, hypertension, irregular heartbeat and swelling in legs/feet.  Gastrointestinal: Negative for constipation and diarrhea.  Endocrine: Negative for increased urination.  Musculoskeletal: Positive for arthralgias, joint pain and morning stiffness. Negative for joint swelling, myalgias, muscle weakness, muscle tenderness and myalgias.  Skin: Negative for color change, rash, hair loss, nodules/bumps, skin tightness, ulcers and sensitivity to sunlight.  Allergic/Immunologic: Negative for susceptible to infections.  Neurological: Negative for dizziness, fainting, memory loss, night sweats and weakness ( ).  Hematological: Negative for swollen glands.  Psychiatric/Behavioral: Negative for  depressed mood and sleep disturbance. The patient is not nervous/anxious.     PMFS History:  Patient Active Problem List   Diagnosis Date Noted  . Candidiasis   . Abnormality of gait   . Hypoalbuminemia due to protein-calorie malnutrition ([place])   . History of gout   . Sleep disturbance   . Constipation   . Rupture of quadriceps tendon, right, sequela 06/19/2017  . Quadriceps tendon rupture, left, sequela 06/19/2017  . Acute blood loss anemia 06/19/2017  . Fall   . History of subdural hematoma   . Post-operative pain   . Recurrent falls   . Quadriceps tendon rupture 06/15/2017  . Primary osteoarthritis of both knees 02/28/2017  . Primary osteoarthritis of both hands 02/28/2017  . Uricacidemia 02/28/2017  . Pain in joint of left knee 02/07/2017  . Idiopathic chronic gout, unspecified site, without tophus (tophi) 11/29/2016  . HEMATURIA UNSPECIFIED 01/25/2009  . ABDOMINAL PAIN 01/25/2009  . XERODERMA 03/10/2008  . UNS ADVRS EFF UNS RX MEDICINAL&BIOLOGICAL SBSTNC 03/10/2008  . ADJUSTMENT REACTION WITH PHYSICAL SYMPTOMS 02/14/2008  . MUSCLE SPASM 02/14/2008  . ACUTE PROSTATITIS 12/16/2007  . BREAST LUMP OR MASS, RIGHT 07/12/2007  . H/O: RCT (rotator cuff tear) 01/13/2007  . GOUT 01/08/2007    Past Medical History:  Diagnosis Date  . Arthritis    R shoulder, great toes, hands- has gout & has injections in knees with Dr. Estanislado Pandy   . Cancer ([place])    skin ca-   . GERD (gastroesophageal reflux disease)   . History of hiatal hernia   . SDH (subdural hematoma) ([place])     Family History  Problem Relation Age of Onset  . Parkinson's disease Mother   . Heart disease Father   . Parkinson's disease Brother   . Diabetes Brother    Past Surgical History:  Procedure Laterality Date  . BRAIN SURGERY  2016   evacuation of SDH  . CARPAL TUNNEL RELEASE Bilateral   . GREEN LIGHT LASER TURP (TRANSURETHRAL RESECTION OF PROSTATE  04/27/2017  . QUADRICEPS TENDON REPAIR Bilateral  06/15/2017   Procedure: REPAIR QUADRICEP TENDON;  Surgeon: Meredith Pel, MD;  Location: [facility];  Service: Orthopedics;  Laterality: Bilateral;  . SHOULDER SURGERY Right    Social History   Social History Narrative  . Not on file     Objective: Vital Signs: BP 111/87 (BP Location: Left Arm, Patient Position: Sitting, Cuff Size: Normal)   Pulse 64   Resp 15   Ht 6' (1.829 m)   Wt 200 lb (90.7 kg)   BMI 27.12 kg/m    Physical Exam  Constitutional: He is oriented to person, place, and time. He appears well-developed and well-nourished.  HENT:  Head: Normocephalic and atraumatic.  Eyes: Pupils are equal, round, and reactive to light. Conjunctivae and EOM are normal.  Neck: Normal range of motion. Neck supple.  Cardiovascular: Normal rate, regular rhythm and normal heart sounds.  Pulmonary/Chest: Effort normal and breath sounds normal.  Abdominal: Soft. Bowel sounds are normal.  Neurological: He is alert and oriented to person, place, and time.  Skin: Skin is warm and dry. Capillary refill takes less than 2 seconds.  Psychiatric: He has a normal mood and affect. His behavior is normal.  Nursing note and vitals reviewed.    Musculoskeletal Exam: C-spine thoracic spine good range of motion.  He has some discomfort range of motion of his lumbar spine.  Decreased range of motion of his left shoulder due to polio.  Elbow joints wrist joints are in good range of motion.  He has DIP PIP thickening in his hands.  His knee joints were in good range of motion.  He has bilateral quadriceps repair which causes some discomfort with range of motion.  He also has atrophy of bilateral quadriceps.  CDAI Exam: No CDAI exam completed.    Investigation: No additional findings.Uric acid: 09/04/2017 5.0 CBC Latest Ref Rng & Units 09/04/2017 06/26/2017 06/20/2017  WBC 3.8 - 10.8 Thousand/uL 6.9 7.2 7.8  Hemoglobin 13.2 - 17.1 g/dL 14.1 12.4(L) 12.4(L)  Hematocrit 38.5 - 50.0 % 43.0 38.0(L) 37.4(L)    Platelets 140 - 400 Thousand/uL 239 286 231   CMP Latest Ref Rng & Units 11/12/2017 09/04/2017 06/26/2017  Glucose 65 - 99 mg/dL 90 76 95  BUN 8 - 27 mg/dL 23 20 21(H)  Creatinine 0.76 - 1.27 mg/dL 1.04 1.08 0.99  Sodium 134 - 144 mmol/L 142 141 139  Potassium 3.5 - 5.2 mmol/L 4.2 4.4 3.8  Chloride 96 - 106 mmol/L 105 106 106  CO2 20 - 29 mmol/L 22 26 26   Calcium 8.6 - 10.2 mg/dL 9.2 9.3 8.8(L)  Total Protein 6.1 - 8.1 g/dL - 6.3 -  Total Bilirubin 0.2 - 1.2 mg/dL - 0.2 -  Alkaline Phos 38 - 126 U/L - - -  AST 10 - 35 U/L - 14 -  ALT 9 - 46 U/L - 17 -    Imaging: Xr C-arm No Report  Result Date: 02/27/2018 Please see Notes or Procedures tab for imaging impression.   Speciality Comments: No specialty comments available.    Procedures:  No  procedures performed Allergies: Patient has no known allergies.   Assessment / Plan:     Visit Diagnoses: Idiopathic chronic gout of multiple sites without tophus - colchicine Uric acid: 09/04/2017 5.0 .patient not patient has not had any gout flare since the last visit. He has been taking allopurinol and colchicine on a as needed basis.  Plan: Uric acid  Primary osteoarthritis of both hands: He has some stiffness in his hands due to underlying osteoarthritis.  Primary osteoarthritis of both knees - status post bilateral quadriceps repair.  He has been trying to work out and strengthen the muscles in bilateral lower extremities.  Lower back pain: He does not have any radiculopathy.  A handout on back muscle strengthening exercises was given.  Neuropathy  Subdural hematoma ([place]) - 2016  Polio - child. decreased ROM in left shoulder joint.  Medication monitoring encounter - Plan: CBC with Differential/Platelet, COMPLETE METABOLIC PANEL WITH GFR    Orders: Orders Placed This Encounter  Procedures  . CBC with Differential/Platelet  . COMPLETE METABOLIC PANEL WITH GFR  . Uric acid   No orders of the defined types were placed in this  encounter.    Follow-Up Instructions: Return in about 6 months (around 09/28/2018) for Gout, Osteoarthritis.   Bo Merino, MD  Note - This record has been created using Editor, commissioning.  Chart creation errors have been sought, but may not always  have been located. Such creation errors do not reflect on  the standard of medical care.

## 2018-03-19 ENCOUNTER — Ambulatory Visit: Payer: Medicare Other | Attending: Orthopedic Surgery | Admitting: Physical Therapy

## 2018-03-19 ENCOUNTER — Encounter: Payer: Self-pay | Admitting: Physical Therapy

## 2018-03-19 DIAGNOSIS — M25561 Pain in right knee: Secondary | ICD-10-CM | POA: Diagnosis not present

## 2018-03-19 DIAGNOSIS — R262 Difficulty in walking, not elsewhere classified: Secondary | ICD-10-CM | POA: Diagnosis not present

## 2018-03-19 DIAGNOSIS — M62838 Other muscle spasm: Secondary | ICD-10-CM | POA: Diagnosis not present

## 2018-03-19 DIAGNOSIS — M25562 Pain in left knee: Secondary | ICD-10-CM | POA: Insufficient documentation

## 2018-03-19 DIAGNOSIS — R2689 Other abnormalities of gait and mobility: Secondary | ICD-10-CM | POA: Diagnosis not present

## 2018-03-19 DIAGNOSIS — G8929 Other chronic pain: Secondary | ICD-10-CM | POA: Diagnosis not present

## 2018-03-19 DIAGNOSIS — M545 Low back pain, unspecified: Secondary | ICD-10-CM

## 2018-03-20 ENCOUNTER — Encounter: Payer: Self-pay | Admitting: Physical Therapy

## 2018-03-20 ENCOUNTER — Ambulatory Visit: Payer: Medicare Other | Admitting: Physical Therapy

## 2018-03-20 DIAGNOSIS — M62838 Other muscle spasm: Secondary | ICD-10-CM

## 2018-03-20 DIAGNOSIS — M25562 Pain in left knee: Secondary | ICD-10-CM | POA: Diagnosis not present

## 2018-03-20 DIAGNOSIS — M545 Low back pain, unspecified: Secondary | ICD-10-CM

## 2018-03-20 DIAGNOSIS — R262 Difficulty in walking, not elsewhere classified: Secondary | ICD-10-CM | POA: Diagnosis not present

## 2018-03-20 DIAGNOSIS — G8929 Other chronic pain: Secondary | ICD-10-CM | POA: Diagnosis not present

## 2018-03-20 DIAGNOSIS — M25561 Pain in right knee: Secondary | ICD-10-CM | POA: Diagnosis not present

## 2018-03-20 NOTE — Therapy (Signed)
[place], [place], [address] Phone: [phone]   Fax:  [phone]  Physical Therapy Treatment  Patient Details  Name: Jose [name] MRN: [id number] Date of Birth: 1951/01/04 Referring Provider: Dr Heather Roberts    Encounter Date: 03/19/2018  PT End of Session - 03/20/18 0819    Visit Number  7    Number of Visits  18    Date for PT Re-Evaluation  04/15/18    Authorization Type  Blue cross blue shield     PT Start Time  1545    PT Stop Time  1625    PT Time Calculation (min)  40 min    Activity Tolerance  Patient tolerated treatment well    Behavior During Therapy  [facility] for tasks assessed/performed       Past Medical History:  Diagnosis Date  . Arthritis    R shoulder, great toes, hands- has gout & has injections in knees with Dr. Estanislado Pandy   . Cancer ([place])    skin ca-   . GERD (gastroesophageal reflux disease)   . History of hiatal hernia   . SDH (subdural hematoma) ([facility])     Past Surgical History:  Procedure Laterality Date  . BRAIN SURGERY  2016   evacuation of SDH  . CARPAL TUNNEL RELEASE Bilateral   . GREEN LIGHT LASER TURP (TRANSURETHRAL RESECTION OF PROSTATE  04/27/2017  . QUADRICEPS TENDON REPAIR Bilateral 06/15/2017   Procedure: REPAIR QUADRICEP TENDON;  Surgeon: Meredith Pel, MD;  Location: [facility];  Service: Orthopedics;  Laterality: Bilateral;  . SHOULDER SURGERY Right     There were no vitals filed for this visit.  Subjective Assessment - 03/19/18 1610    Subjective  Patient reports his back was sore this morning. The pain reached a 9/10. He was able to use his stretches to get his pain back down to a 0/10. He took the weekend off of the gym.     Limitations  Lifting    How long can you stand comfortably?  < 20 mins     Diagnostic tests  noting post op     Currently in Pain?  No/denies                       [facility] Adult PT Treatment/Exercise -  03/20/18 0001      Lumbar Exercises: Standing   Other Standing Lumbar Exercises  standing extension red 2x10; scap retractions 2x10 red       Lumbar Exercises: Supine   Clam  10 reps;Limitations    Clam Limitations  blue band     Bridge Limitations  2x10     Other Supine Lumbar Exercises  SLR      Knee/Hip Exercises: Stretches   Active Hamstring Stretch  20 seconds;Right;3 reps      Knee/Hip Exercises: Standing   Hip Flexion Limitations  2x10 2lb     Abduction Limitations  2x10 2lb     Extension Limitations  2x10 2 lb       Knee/Hip Exercises: Supine   Bridges Limitations  x10    Straight Leg Raises  Both;2 sets;10 reps      Manual Therapy   Manual Therapy  Soft tissue mobilization;Manual Traction R glute and ITB rolled out; R lumbar trigger point release     Manual Traction  LAD of right lower extremity 5x30 sec hold  PT Education - 03/19/18 1614    Education provided  Yes    Education Details  technique with ther-ex     Person(s) Educated  Patient    Methods  Explanation;Tactile cues;Demonstration;Verbal cues    Comprehension  Verbalized understanding;Returned demonstration;Verbal cues required;Tactile cues required       PT Short Term Goals - 03/20/18 0820      PT SHORT TERM GOAL #1   Title  Patient will become independent with HEP for core strength and stretching.    Baseline  pain 4/10     Time  3    Period  Weeks    Status  On-going      PT SHORT TERM GOAL #2   Title  Patient will decrease self reported tenderness in the lumbar paraspinals with palpation.    Baseline  independent    Time  3    Period  Weeks    Status  On-going      PT SHORT TERM GOAL #3   Title  Patient will increase lumbar extensbility by 25%.    Baseline  continues to have     Time  3    Period  Weeks    Status  On-going        PT Long Term Goals - 03/05/18 1287      PT LONG TERM GOAL #1   Title  Patient will demonstrat a 35% limitaion on FOTO.    Time  6     Period  Weeks    Status  New    Target Date  04/16/18      PT LONG TERM GOAL #2   Title  Pt will increase hip flexor strength from a 4/5 to a 5/5 in order to stand at work greater than 30 mins with no pain.    Time  6    Period  Weeks    Status  New    Target Date  04/16/18      PT LONG TERM GOAL #3   Title  Patient will increase core strength in odert o stabilize the lower back to perform daily functional activities.     Time  6    Period  Weeks    Status  New    Target Date  04/16/18            Plan - 03/20/18 0819    Clinical Impression Statement  Despite pain this morning the patient the patient tolerated treatment well. He had no increase in pain. He will be going to th gym later.     Clinical Presentation  Stable    Clinical Decision Making  Low    Rehab Potential  Good    PT Frequency  3x / week    PT Duration  8 weeks    PT Treatment/Interventions  ADLs/Self Care Home Management;Cryotherapy;Electrical Stimulation;Moist Heat;Traction;Ultrasound;Gait Scientist, forensic;Therapeutic activities;Therapeutic exercise;Patient/family education;Manual techniques;Passive range of motion;Dry needling;Taping    PT Next Visit Plan  soft tissue mobilization to the lower back; progress hip strenghtening exercises; PA mobilizations L3-L5    PT Home Exercise Plan  SLR; bridge, clamshell; standing hip abduction and extension; standing march     Consulted and Agree with Plan of Care  Patient       Patient will benefit from skilled therapeutic intervention in order to improve the following deficits and impairments:  Abnormal gait, Pain, Increased muscle spasms, Decreased activity tolerance, Decreased endurance, Decreased strength  Visit Diagnosis: Chronic right-sided low back pain  without sciatica  Other muscle spasm  Difficulty in walking, not elsewhere classified  Acute pain of right knee  Acute pain of left knee  Other abnormalities of gait and  mobility     Problem List Patient Active Problem List   Diagnosis Date Noted  . Candidiasis   . Abnormality of gait   . Hypoalbuminemia due to protein-calorie malnutrition ([place])   . History of gout   . Sleep disturbance   . Constipation   . Rupture of quadriceps tendon, right, sequela 06/19/2017  . Quadriceps tendon rupture, left, sequela 06/19/2017  . Acute blood loss anemia 06/19/2017  . Fall   . History of subdural hematoma   . Post-operative pain   . Recurrent falls   . Quadriceps tendon rupture 06/15/2017  . Primary osteoarthritis of both knees 02/28/2017  . Primary osteoarthritis of both hands 02/28/2017  . Uricacidemia 02/28/2017  . Pain in joint of left knee 02/07/2017  . Idiopathic chronic gout, unspecified site, without tophus (tophi) 11/29/2016  . HEMATURIA UNSPECIFIED 01/25/2009  . ABDOMINAL PAIN 01/25/2009  . XERODERMA 03/10/2008  . UNS ADVRS EFF UNS RX MEDICINAL&BIOLOGICAL SBSTNC 03/10/2008  . ADJUSTMENT REACTION WITH PHYSICAL SYMPTOMS 02/14/2008  . MUSCLE SPASM 02/14/2008  . ACUTE PROSTATITIS 12/16/2007  . BREAST LUMP OR MASS, RIGHT 07/12/2007  . H/O: RCT (rotator cuff tear) 01/13/2007  . GOUT 01/08/2007    Carney Living PT DPT  03/20/2018, 8:22 AM  Cooper Render SPT  03/20/2018   [facility] [place], [place], [address] Phone: [phone]   Fax:  [phone]  Name: Jose [name] MRN: [id number] Date of Birth: 1951-03-14

## 2018-03-20 NOTE — Therapy (Signed)
[place], [place], [address] Phone: [phone]   Fax:  [phone]  Physical Therapy Treatment  Patient Details  Name: Jose [name] MRN: [id number] Date of Birth: Mar 07, 1951 Referring Provider: Dr Heather Roberts    Encounter Date: 03/20/2018  PT End of Session - 03/20/18 1233    Visit Number  8    Number of Visits  18    Date for PT Re-Evaluation  04/15/18    Authorization Type  Blue cross blue shield     PT Start Time  0945 Patient 15 minutes late to appointment.     PT Stop Time  1020    PT Time Calculation (min)  35 min    Activity Tolerance  Patient tolerated treatment well    Behavior During Therapy  W[facility] for tasks assessed/performed       Past Medical History:  Diagnosis Date  . Arthritis    R shoulder, great toes, hands- has gout & has injections in knees with Dr. Estanislado Pandy   . Cancer ([place])    skin ca-   . GERD (gastroesophageal reflux disease)   . History of hiatal hernia   . SDH (subdural hematoma) ([facility])     Past Surgical History:  Procedure Laterality Date  . BRAIN SURGERY  2016   evacuation of SDH  . CARPAL TUNNEL RELEASE Bilateral   . GREEN LIGHT LASER TURP (TRANSURETHRAL RESECTION OF PROSTATE  04/27/2017  . QUADRICEPS TENDON REPAIR Bilateral 06/15/2017   Procedure: REPAIR QUADRICEP TENDON;  Surgeon: Meredith Pel, MD;  Location: [facility];  Service: Orthopedics;  Laterality: Bilateral;  . SHOULDER SURGERY Right     There were no vitals filed for this visit.  Subjective Assessment - 03/20/18 0949    Subjective  Patient reports he hasd minor pain this morning. He went to the gym last night. He was able to stretch and decrease the pain.     Limitations  Lifting    How long can you stand comfortably?  < 20 mins     Currently in Pain?  Yes    Pain Location  Knee    Pain Orientation  Right;Left    Pain Descriptors / Indicators  Sore    Pain Type  Acute pain    Pain Onset   More than a month ago    Pain Frequency  Intermittent    Aggravating Factors   standing up from alow surface     Pain Relieving Factors  ice     Effect of Pain on Daily Activities  pain with daily activity     Pain Score  4    Pain Location  Knee    Pain Orientation  Right;Left    Pain Descriptors / Indicators  Aching    Pain Onset  More than a month ago    Pain Frequency  Constant    Aggravating Factors   standing and sitting for long periods of time     Pain Relieving Factors  ice and movement                        [facility] Adult PT Treatment/Exercise - 03/20/18 1045      Lumbar Exercises: Stretches   Other Lumbar Stretch Exercise  glute stretch 3x20 sec hold       Manual Therapy   Manual therapy comments  IASTM to left lumbar spine     Manual Traction  LAD  of right lower extremity 5x30 sec hold              PT Education - 03/20/18 1217    Education provided  Yes    Education Details  technique with exercises     Person(s) Educated  Patient    Methods  Explanation;Demonstration;Tactile cues;Verbal cues    Comprehension  Verbalized understanding;Returned demonstration;Verbal cues required;Tactile cues required;Need further instruction       PT Short Term Goals - 03/20/18 0820      PT SHORT TERM GOAL #1   Title  Patient will become independent with HEP for core strength and stretching.    Baseline  pain 4/10     Time  3    Period  Weeks    Status  On-going      PT SHORT TERM GOAL #2   Title  Patient will decrease self reported tenderness in the lumbar paraspinals with palpation.    Baseline  independent    Time  3    Period  Weeks    Status  On-going      PT SHORT TERM GOAL #3   Title  Patient will increase lumbar extensbility by 25%.    Baseline  continues to have     Time  3    Period  Weeks    Status  On-going        PT Long Term Goals - 03/05/18 7026      PT LONG TERM GOAL #1   Title  Patient will demonstrat a 35% limitaion on  FOTO.    Time  6    Period  Weeks    Status  New    Target Date  04/16/18      PT LONG TERM GOAL #2   Title  Pt will increase hip flexor strength from a 4/5 to a 5/5 in order to stand at work greater than 30 mins with no pain.    Time  6    Period  Weeks    Status  New    Target Date  04/16/18      PT LONG TERM GOAL #3   Title  Patient will increase core strength in odert o stabilize the lower back to perform daily functional activities.     Time  6    Period  Weeks    Status  New    Target Date  04/16/18            Plan - 03/20/18 1234    Clinical Impression Statement  Patient reported feeling like the muscle released with soft tissue mobilization. He was advised to do exercises at home 2nd to being late. He will have several meetings over the next week. He was advised to stretch when needed.     Clinical Presentation  Stable    Clinical Decision Making  Low    Rehab Potential  Good    PT Frequency  3x / week    PT Duration  8 weeks    PT Treatment/Interventions  ADLs/Self Care Home Management;Cryotherapy;Electrical Stimulation;Moist Heat;Traction;Ultrasound;Gait Scientist, forensic;Therapeutic activities;Therapeutic exercise;Patient/family education;Manual techniques;Passive range of motion;Dry needling;Taping    PT Next Visit Plan  soft tissue mobilization to the lower back; progress hip strenghtening exercises; PA mobilizations L3-L5    PT Home Exercise Plan  SLR; bridge, clamshell; standing hip abduction and extension; standing march     Consulted and Agree with Plan of Care  Patient       Patient will  benefit from skilled therapeutic intervention in order to improve the following deficits and impairments:  Abnormal gait, Pain, Increased muscle spasms, Decreased activity tolerance, Decreased endurance, Decreased strength  Visit Diagnosis: Chronic right-sided low back pain without sciatica  Other muscle spasm  Difficulty in walking, not elsewhere  classified     Problem List Patient Active Problem List   Diagnosis Date Noted  . Candidiasis   . Abnormality of gait   . Hypoalbuminemia due to protein-calorie malnutrition ([place])   . History of gout   . Sleep disturbance   . Constipation   . Rupture of quadriceps tendon, right, sequela 06/19/2017  . Quadriceps tendon rupture, left, sequela 06/19/2017  . Acute blood loss anemia 06/19/2017  . Fall   . History of subdural hematoma   . Post-operative pain   . Recurrent falls   . Quadriceps tendon rupture 06/15/2017  . Primary osteoarthritis of both knees 02/28/2017  . Primary osteoarthritis of both hands 02/28/2017  . Uricacidemia 02/28/2017  . Pain in joint of left knee 02/07/2017  . Idiopathic chronic gout, unspecified site, without tophus (tophi) 11/29/2016  . HEMATURIA UNSPECIFIED 01/25/2009  . ABDOMINAL PAIN 01/25/2009  . XERODERMA 03/10/2008  . UNS ADVRS EFF UNS RX MEDICINAL&BIOLOGICAL SBSTNC 03/10/2008  . ADJUSTMENT REACTION WITH PHYSICAL SYMPTOMS 02/14/2008  . MUSCLE SPASM 02/14/2008  . ACUTE PROSTATITIS 12/16/2007  . BREAST LUMP OR MASS, RIGHT 07/12/2007  . H/O: RCT (rotator cuff tear) 01/13/2007  . GOUT 01/08/2007    Carney Living  PT DPT  03/20/2018, 12:38 PM  [facility] [address] [place], [place], [address] Phone: [phone]   Fax:  [phone]  Name: Jose [name] MRN: [id number] Date of Birth: 12/18/1951

## 2018-03-27 DIAGNOSIS — F438 Other reactions to severe stress: Secondary | ICD-10-CM | POA: Diagnosis not present

## 2018-03-28 ENCOUNTER — Encounter: Payer: Self-pay | Admitting: Physical Therapy

## 2018-03-28 ENCOUNTER — Ambulatory Visit: Payer: Medicare Other | Admitting: Physical Therapy

## 2018-03-28 DIAGNOSIS — M62838 Other muscle spasm: Secondary | ICD-10-CM | POA: Diagnosis not present

## 2018-03-28 DIAGNOSIS — M545 Low back pain, unspecified: Secondary | ICD-10-CM

## 2018-03-28 DIAGNOSIS — G8929 Other chronic pain: Secondary | ICD-10-CM

## 2018-03-28 DIAGNOSIS — M25561 Pain in right knee: Secondary | ICD-10-CM | POA: Diagnosis not present

## 2018-03-28 DIAGNOSIS — R262 Difficulty in walking, not elsewhere classified: Secondary | ICD-10-CM

## 2018-03-28 DIAGNOSIS — M25562 Pain in left knee: Secondary | ICD-10-CM | POA: Diagnosis not present

## 2018-03-28 NOTE — Therapy (Signed)
[place], [place], [address] Phone: [phone]   Fax:  [phone]  Physical Therapy Treatment  Patient Details  Name: Jose [name] MRN: [id number] Date of Birth: [birth year] Referring Provider: Dr Heather Roberts    Encounter Date: 03/28/2018  PT End of Session - 03/28/18 0854    Visit Number  9    Number of Visits  18    Date for PT Re-Evaluation  04/15/18    Authorization Type  Blue cross blue shield     PT Start Time  0848    PT Stop Time  0940    PT Time Calculation (min)  52 min    Activity Tolerance  Patient tolerated treatment well    Behavior During Therapy  [facility] for tasks assessed/performed       Past Medical History:  Diagnosis Date  . Arthritis    R shoulder, great toes, hands- has gout & has injections in knees with Dr. Estanislado Pandy   . Cancer ([place])    skin ca-   . GERD (gastroesophageal reflux disease)   . History of hiatal hernia   . SDH (subdural hematoma) (HCC)     Past Surgical History:  Procedure Laterality Date  . BRAIN SURGERY  2016   evacuation of SDH  . CARPAL TUNNEL RELEASE Bilateral   . GREEN LIGHT LASER TURP (TRANSURETHRAL RESECTION OF PROSTATE  04/27/2017  . QUADRICEPS TENDON REPAIR Bilateral 06/15/2017   Procedure: REPAIR QUADRICEP TENDON;  Surgeon: Meredith Pel, MD;  Location: [place];  Service: Orthopedics;  Laterality: Bilateral;  . SHOULDER SURGERY Right     There were no vitals filed for this visit.  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 Patient reports back pain at 6/10.     Limitations  Lifting    How long can you stand comfortably?  < 20 mins     Diagnostic tests  noting post op     Currently in Pain?  Yes    Pain Score  4     Pain Location  Knee    Pain Descriptors / Indicators  Sore    Pain Type  Acute pain     Pain Onset  More than a month ago    Pain Frequency  Intermittent    Pain Score  6    Pain Location  Back    Pain Descriptors / Indicators  Aching                       [facility] Adult PT Treatment/Exercise - 03/28/18 0001      Lumbar Exercises: Supine   Clam  10 reps;Limitations    Clam Limitations  Red Band     Bridge Limitations  2x10     Other Supine Lumbar Exercises  SLR 2x10      Knee/Hip Exercises: Stretches   Piriformis Stretch  3 reps;20 seconds;Both      Cryotherapy   Number Minutes Cryotherapy  10 Minutes    Cryotherapy Location  Lumbar Spine    Type of Cryotherapy  Ice pack      Manual Therapy   Manual therapy comments  IASTM to right lumbar spine and upper gluteals             PT Education - 03/28/18 7858  Education provided  Yes    Education Details  Reviewed effectiveness of dry needling for symptom relief     Person(s) Educated  Patient    Methods  Explanation;Demonstration;Tactile cues;Verbal cues    Comprehension  Verbalized understanding;Returned demonstration;Need further instruction       PT Short Term Goals - 03/20/18 0820      PT SHORT TERM GOAL #1   Title  Patient will become independent with HEP for core strength and stretching.    Baseline  pain 4/10     Time  3    Period  Weeks    Status  On-going      PT SHORT TERM GOAL #2   Title  Patient will decrease self reported tenderness in the lumbar paraspinals with palpation.    Baseline  independent    Time  3    Period  Weeks    Status  On-going      PT SHORT TERM GOAL #3   Title  Patient will increase lumbar extensbility by 25%.    Baseline  continues to have     Time  3    Period  Weeks    Status  On-going        PT Long Term Goals - 03/05/18 1540      PT LONG TERM GOAL #1   Title  Patient will demonstrat a 35% limitaion on FOTO.    Time  6    Period  Weeks    Status  New    Target Date  04/16/18      PT LONG TERM GOAL #2   Title  Pt will increase  hip flexor strength from a 4/5 to a 5/5 in order to stand at work greater than 30 mins with no pain.    Time  6    Period  Weeks    Status  New    Target Date  04/16/18      PT LONG TERM GOAL #3   Title  Patient will increase core strength in odert o stabilize the lower back to perform daily functional activities.     Time  6    Period  Weeks    Status  New    Target Date  04/16/18            Plan - 03/28/18 0856    Clinical Impression Statement  Patient reported tightness in the right side of his lower back. With skilled palpation therapy found muscle spasming and trigger points in the lumbar paraspinals and upper gluteals. Therapy performed dry needling targeting trigger points that were found and then performed soft tissue mobilization to the area. Patient responded well the manual treatment and was able to perform light stretches and exercises.     Clinical Presentation  Stable    Clinical Decision Making  Low    Rehab Potential  Good    PT Frequency  3x / week    PT Duration  8 weeks    PT Treatment/Interventions  ADLs/Self Care Home Management;Cryotherapy;Electrical Stimulation;Moist Heat;Traction;Ultrasound;Gait Scientist, forensic;Therapeutic activities;Therapeutic exercise;Patient/family education;Manual techniques;Passive range of motion;Dry needling;Taping    PT Next Visit Plan  soft tissue mobilization to the lower back; progress hip strenghtening exercises; PA mobilizations L3-L5    PT Home Exercise Plan  SLR; bridge, clamshell; standing hip abduction and extension; standing march     Consulted and Agree with Plan of Care  Patient       Patient will benefit from skilled therapeutic  intervention in order to improve the following deficits and impairments:  Abnormal gait, Pain, Increased muscle spasms, Decreased activity tolerance, Decreased endurance, Decreased strength  Visit Diagnosis: Chronic right-sided low back pain without sciatica  Other muscle  spasm  Difficulty in walking, not elsewhere classified     Problem List Patient Active Problem List   Diagnosis Date Noted  . Candidiasis   . Abnormality of gait   . Hypoalbuminemia due to protein-calorie malnutrition ([place])   . History of gout   . Sleep disturbance   . Constipation   . Rupture of quadriceps tendon, right, sequela 06/19/2017  . Quadriceps tendon rupture, left, sequela 06/19/2017  . Acute blood loss anemia 06/19/2017  . Fall   . History of subdural hematoma   . Post-operative pain   . Recurrent falls   . Quadriceps tendon rupture 06/15/2017  . Primary osteoarthritis of both knees 02/28/2017  . Primary osteoarthritis of both hands 02/28/2017  . Uricacidemia 02/28/2017  . Pain in joint of left knee 02/07/2017  . Idiopathic chronic gout, unspecified site, without tophus (tophi) 11/29/2016  . HEMATURIA UNSPECIFIED 01/25/2009  . ABDOMINAL PAIN 01/25/2009  . XERODERMA 03/10/2008  . UNS ADVRS EFF UNS RX MEDICINAL&BIOLOGICAL SBSTNC 03/10/2008  . ADJUSTMENT REACTION WITH PHYSICAL SYMPTOMS 02/14/2008  . MUSCLE SPASM 02/14/2008  . ACUTE PROSTATITIS 12/16/2007  . BREAST LUMP OR MASS, RIGHT 07/12/2007  . H/O: RCT (rotator cuff tear) 01/13/2007  . GOUT 01/08/2007    Carney Living PT DPT  03/28/2018, 9:52 AM  Cooper Render SPT  03/28/2018 1:24PM   During this treatment session, the therapist was present, participating in and directing the treatment.  [place], [place], [address] Phone: [phone]   Fax:  [phone]  Name: Jose [name] MRN: [id number] Date of Birth: 1951/09/27

## 2018-03-29 ENCOUNTER — Encounter: Payer: Self-pay | Admitting: Rheumatology

## 2018-03-29 ENCOUNTER — Ambulatory Visit (INDEPENDENT_AMBULATORY_CARE_PROVIDER_SITE_OTHER): Payer: Medicare Other | Admitting: Rheumatology

## 2018-03-29 VITALS — BP 111/87 | HR 64 | Resp 15 | Ht 72.0 in | Wt 200.0 lb

## 2018-03-29 DIAGNOSIS — A809 Acute poliomyelitis, unspecified: Secondary | ICD-10-CM | POA: Diagnosis not present

## 2018-03-29 DIAGNOSIS — M170 Bilateral primary osteoarthritis of knee: Secondary | ICD-10-CM | POA: Diagnosis not present

## 2018-03-29 DIAGNOSIS — G8929 Other chronic pain: Secondary | ICD-10-CM

## 2018-03-29 DIAGNOSIS — M545 Low back pain, unspecified: Secondary | ICD-10-CM

## 2018-03-29 DIAGNOSIS — M1A09X0 Idiopathic chronic gout, multiple sites, without tophus (tophi): Secondary | ICD-10-CM

## 2018-03-29 DIAGNOSIS — S065XAA Traumatic subdural hemorrhage with loss of consciousness status unknown, initial encounter: Secondary | ICD-10-CM

## 2018-03-29 DIAGNOSIS — Z5181 Encounter for therapeutic drug level monitoring: Secondary | ICD-10-CM | POA: Diagnosis not present

## 2018-03-29 DIAGNOSIS — M19041 Primary osteoarthritis, right hand: Secondary | ICD-10-CM

## 2018-03-29 DIAGNOSIS — G629 Polyneuropathy, unspecified: Secondary | ICD-10-CM

## 2018-03-29 DIAGNOSIS — S065X9A Traumatic subdural hemorrhage with loss of consciousness of unspecified duration, initial encounter: Secondary | ICD-10-CM

## 2018-03-29 DIAGNOSIS — M19042 Primary osteoarthritis, left hand: Secondary | ICD-10-CM

## 2018-03-29 NOTE — Patient Instructions (Signed)

## 2018-03-30 LAB — CBC WITH DIFFERENTIAL/PLATELET
BASOS PCT: 0.8 %
Basophils Absolute: 52 cells/uL (ref 0–200)
Eosinophils Absolute: 72 cells/uL (ref 15–500)
Eosinophils Relative: 1.1 %
HCT: 40.3 % (ref 38.5–50.0)
HEMOGLOBIN: 13.9 g/dL (ref 13.2–17.1)
LYMPHS ABS: 1463 {cells}/uL (ref 850–3900)
MCH: 31.7 pg (ref 27.0–33.0)
MCHC: 34.5 g/dL (ref 32.0–36.0)
MCV: 91.8 fL (ref 80.0–100.0)
MPV: 11.6 fL (ref 7.5–12.5)
Monocytes Relative: 8.4 %
NEUTROS ABS: 4368 {cells}/uL (ref 1500–7800)
Neutrophils Relative %: 67.2 %
Platelets: 193 10*3/uL (ref 140–400)
RBC: 4.39 10*6/uL (ref 4.20–5.80)
RDW: 13.6 % (ref 11.0–15.0)
Total Lymphocyte: 22.5 %
WBC: 6.5 10*3/uL (ref 3.8–10.8)
WBCMIX: 546 {cells}/uL (ref 200–950)

## 2018-03-30 LAB — COMPLETE METABOLIC PANEL WITH GFR
AG RATIO: 1.8 (calc) (ref 1.0–2.5)
ALBUMIN MSPROF: 3.8 g/dL (ref 3.6–5.1)
ALT: 24 U/L (ref 9–46)
AST: 18 U/L (ref 10–35)
Alkaline phosphatase (APISO): 61 U/L (ref 40–115)
BUN: 19 mg/dL (ref 7–25)
CHLORIDE: 108 mmol/L (ref 98–110)
CO2: 31 mmol/L (ref 20–32)
Calcium: 9.4 mg/dL (ref 8.6–10.3)
Creat: 1.01 mg/dL (ref 0.70–1.25)
GFR, EST AFRICAN AMERICAN: 88 mL/min/{1.73_m2} (ref >=60)
GFR, Est Non African American: 76 mL/min/{1.73_m2} (ref >=60)
GLUCOSE: 118 mg/dL — AB (ref 65–99)
Globulin: 2.1 g/dL (calc) (ref 1.9–3.7)
Potassium: 4.7 mmol/L (ref 3.5–5.3)
Sodium: 144 mmol/L (ref 135–146)
TOTAL PROTEIN: 5.9 g/dL — AB (ref 6.1–8.1)
Total Bilirubin: 0.4 mg/dL (ref 0.2–1.2)

## 2018-03-30 LAB — URIC ACID: Uric Acid, Serum: 4.8 mg/dL (ref 4.0–8.0)

## 2018-04-01 ENCOUNTER — Encounter: Payer: Self-pay | Admitting: Physical Therapy

## 2018-04-01 ENCOUNTER — Ambulatory Visit: Payer: Medicare Other | Admitting: Physical Therapy

## 2018-04-01 DIAGNOSIS — G8929 Other chronic pain: Secondary | ICD-10-CM

## 2018-04-01 DIAGNOSIS — M62838 Other muscle spasm: Secondary | ICD-10-CM

## 2018-04-01 DIAGNOSIS — M545 Low back pain, unspecified: Secondary | ICD-10-CM

## 2018-04-01 DIAGNOSIS — F438 Other reactions to severe stress: Secondary | ICD-10-CM | POA: Diagnosis not present

## 2018-04-01 DIAGNOSIS — M25561 Pain in right knee: Secondary | ICD-10-CM | POA: Diagnosis not present

## 2018-04-01 DIAGNOSIS — R262 Difficulty in walking, not elsewhere classified: Secondary | ICD-10-CM

## 2018-04-01 DIAGNOSIS — M25562 Pain in left knee: Secondary | ICD-10-CM | POA: Diagnosis not present

## 2018-04-01 NOTE — Therapy (Addendum)
[place], [place], [address] Phone: [phone]   Fax:  [phone]  Physical Therapy Treatment  Patient Details  Name: Jose [name] MRN: [id number] Date of Birth: 27-Oct-1951 Referring Provider: Dr Heather Roberts    Encounter Date: 04/01/2018  PT End of Session - 04/01/18 1515    Visit Number  10    Number of Visits  18    Date for PT Re-Evaluation  04/15/18    Authorization Type  Blue cross blue shield     PT Start Time  1508    PT Stop Time  1559    PT Time Calculation (min)  51 min    Activity Tolerance  Patient tolerated treatment well    Behavior During Therapy  [facility] for tasks assessed/performed       Past Medical History:  Diagnosis Date  . Arthritis    R shoulder, great toes, hands- has gout & has injections in knees with Dr. Estanislado Pandy   . Cancer ([place])    skin ca-   . GERD (gastroesophageal reflux disease)   . History of hiatal hernia   . SDH (subdural hematoma) (HCC)     Past Surgical History:  Procedure Laterality Date  . BRAIN SURGERY  2016   evacuation of SDH  . CARPAL TUNNEL RELEASE Bilateral   . GREEN LIGHT LASER TURP (TRANSURETHRAL RESECTION OF PROSTATE  04/27/2017  . QUADRICEPS TENDON REPAIR Bilateral 06/15/2017   Procedure: REPAIR QUADRICEP TENDON;  Surgeon: Meredith Pel, MD;  Location: [place];  Service: Orthopedics;  Laterality: Bilateral;  . SHOULDER SURGERY Right     There were no vitals filed for this visit.  Subjective Assessment - 04/01/18 1511    Subjective  Patient reports that he is experiencing pain today in his back and knees. Patient reports that he felt better after last visit but the pain came back on after a few days because of his increase in activity over the weekenc.     Limitations  Lifting    How long can you stand comfortably?  < 20 mins     Diagnostic tests  noting post op     Currently in Pain?  Yes    Pain Score  7     Pain Location   Knee    Pain Orientation  Right;Left    Pain Descriptors / Indicators  Sore    Pain Type  Acute pain    Pain Onset  More than a month ago    Pain Frequency  Intermittent    Aggravating Factors   standing up from a low surface     Pain Relieving Factors  ice     Effect of Pain on Daily Activities  pain with daily activity     Pain Score  6    Pain Location  Back    Pain Orientation  Right;Left    Pain Descriptors / Indicators  Aching    Pain Type  Surgical pain    Pain Onset  More than a month ago    Pain Frequency  Constant    Aggravating Factors   standing and sitting for long periods of time     Pain Relieving Factors  ice and movement                        [facility] Adult PT Treatment/Exercise - 04/01/18 0001      Lumbar Exercises: Supine  Clam  Limitations;20 reps    Clam Limitations  Green band    Bridge Limitations  2x10     Other Supine Lumbar Exercises  SLR 2x10; bilateral       Cryotherapy   Number Minutes Cryotherapy  10 Minutes    Cryotherapy Location  Lumbar Spine;Knee    Type of Cryotherapy  Ice pack      Manual Therapy   Manual therapy comments  IASTM to right lumbar spine and upper gluteals    Manual Traction  LAD of right lower extremity 5x30 sec hold        Trigger Point Dry Needling - 04/01/18 1557    Consent Given?  Yes    Education Handout Provided  Yes    Muscles Treated Upper Body  Longissimus    Longissimus Response  Twitch response elicited           PT Education - 04/01/18 1553    Education provided  Yes    Education Details  Reviewed importance of stretches for symptom relief     Person(s) Educated  Patient    Methods  Explanation;Demonstration;Tactile cues;Verbal cues    Comprehension  Verbalized understanding;Need further instruction;Returned demonstration       PT Short Term Goals - 03/20/18 0820      PT SHORT TERM GOAL #1   Title  Patient will become independent with HEP for core strength and stretching.     Baseline  pain 4/10     Time  3    Period  Weeks    Status  On-going      PT SHORT TERM GOAL #2   Title  Patient will decrease self reported tenderness in the lumbar paraspinals with palpation.    Baseline  independent    Time  3    Period  Weeks    Status  On-going      PT SHORT TERM GOAL #3   Title  Patient will increase lumbar extensbility by 25%.    Baseline  continues to have     Time  3    Period  Weeks    Status  On-going        PT Long Term Goals - 03/05/18 7425      PT LONG TERM GOAL #1   Title  Patient will demonstrat a 35% limitaion on FOTO.    Time  6    Period  Weeks    Status  New    Target Date  04/16/18      PT LONG TERM GOAL #2   Title  Pt will increase hip flexor strength from a 4/5 to a 5/5 in order to stand at work greater than 30 mins with no pain.    Time  6    Period  Weeks    Status  New    Target Date  04/16/18      PT LONG TERM GOAL #3   Title  Patient will increase core strength in odert o stabilize the lower back to perform daily functional activities.     Time  6    Period  Weeks    Status  New    Target Date  04/16/18            Plan - 04/01/18 1600    Clinical Impression Statement  Patient recieved dry needling after trigger points were located in the lower back with skilled palpation. Patient responded well to TPDN. Therapy contined with the use of  IASTM to the lumbar paraspinals and upper gluteals in order to further release trigger points. Patient reported no increase in pain with hip strengthening exercises following soft tissue mobilization. Therapy will continue with manual treatment to paitents lower back and strengthening.     Clinical Presentation  Stable    Clinical Decision Making  Low    Rehab Potential  Good    PT Frequency  3x / week    PT Duration  8 weeks    PT Treatment/Interventions  ADLs/Self Care Home Management;Cryotherapy;Electrical Stimulation;Moist Heat;Traction;Ultrasound;Gait Art gallery manager;Therapeutic activities;Therapeutic exercise;Patient/family education;Manual techniques;Passive range of motion;Dry needling;Taping    PT Next Visit Plan  soft tissue mobilization to the lower back; progress hip strenghtening exercises; PA mobilizations L3-L5    PT Home Exercise Plan  SLR; bridge, clamshell; standing hip abduction and extension; standing march     Consulted and Agree with Plan of Care  Patient       Patient will benefit from skilled therapeutic intervention in order to improve the following deficits and impairments:  Abnormal gait, Pain, Increased muscle spasms, Decreased activity tolerance, Decreased endurance, Decreased strength  Visit Diagnosis: Chronic right-sided low back pain without sciatica  Other muscle spasm  Difficulty in walking, not elsewhere classified     Problem List Patient Active Problem List   Diagnosis Date Noted  . Candidiasis   . Abnormality of gait   . Hypoalbuminemia due to protein-calorie malnutrition ([place])   . History of gout   . Sleep disturbance   . Constipation   . Rupture of quadriceps tendon, right, sequela 06/19/2017  . Quadriceps tendon rupture, left, sequela 06/19/2017  . Acute blood loss anemia 06/19/2017  . Fall   . History of subdural hematoma   . Post-operative pain   . Recurrent falls   . Quadriceps tendon rupture 06/15/2017  . Primary osteoarthritis of both knees 02/28/2017  . Primary osteoarthritis of both hands 02/28/2017  . Uricacidemia 02/28/2017  . Pain in joint of left knee 02/07/2017  . Idiopathic chronic gout, unspecified site, without tophus (tophi) 11/29/2016  . HEMATURIA UNSPECIFIED 01/25/2009  . ABDOMINAL PAIN 01/25/2009  . XERODERMA 03/10/2008  . UNS ADVRS EFF UNS RX MEDICINAL&BIOLOGICAL SBSTNC 03/10/2008  . ADJUSTMENT REACTION WITH PHYSICAL SYMPTOMS 02/14/2008  . MUSCLE SPASM 02/14/2008  . ACUTE PROSTATITIS 12/16/2007  . BREAST LUMP OR MASS, RIGHT 07/12/2007  . H/O: RCT (rotator cuff tear)  01/13/2007  . GOUT 01/08/2007   Carolyne Littles PT DPT  04/01/2018  Cooper Render SPT  04/01/2018, 4:03 PM   During this treatment session, the therapist was present, participating in and directing the treatment.   [place], [place], [address] Phone: [phone]   Fax:  [phone]  Name: Jose [name] MRN: [id number] Date of Birth: May 05, 1951

## 2018-04-03 ENCOUNTER — Ambulatory Visit: Payer: Medicare Other | Admitting: Physical Therapy

## 2018-04-03 ENCOUNTER — Encounter: Payer: Self-pay | Admitting: Physical Therapy

## 2018-04-03 DIAGNOSIS — M25562 Pain in left knee: Secondary | ICD-10-CM | POA: Diagnosis not present

## 2018-04-03 DIAGNOSIS — M545 Low back pain, unspecified: Secondary | ICD-10-CM

## 2018-04-03 DIAGNOSIS — G8929 Other chronic pain: Secondary | ICD-10-CM

## 2018-04-03 DIAGNOSIS — D485 Neoplasm of uncertain behavior of skin: Secondary | ICD-10-CM | POA: Diagnosis not present

## 2018-04-03 DIAGNOSIS — M62838 Other muscle spasm: Secondary | ICD-10-CM | POA: Diagnosis not present

## 2018-04-03 DIAGNOSIS — L988 Other specified disorders of the skin and subcutaneous tissue: Secondary | ICD-10-CM | POA: Diagnosis not present

## 2018-04-03 DIAGNOSIS — R262 Difficulty in walking, not elsewhere classified: Secondary | ICD-10-CM | POA: Diagnosis not present

## 2018-04-03 DIAGNOSIS — M25561 Pain in right knee: Secondary | ICD-10-CM | POA: Diagnosis not present

## 2018-04-03 DIAGNOSIS — D045 Carcinoma in situ of skin of trunk: Secondary | ICD-10-CM | POA: Diagnosis not present

## 2018-04-04 NOTE — Therapy (Addendum)
[place], [place], [address] Phone: [phone]   Fax:  [phone]  Physical Therapy Treatment  Patient Details  Name: Jose [name] MRN: [id number] Date of Birth: Dec 02, 1951 Referring Provider: Dr Heather Roberts    Encounter Date: 04/03/2018  PT End of Session - 04/03/18 1536    Visit Number  11    Number of Visits  18    Date for PT Re-Evaluation  04/15/18    Authorization Type  Blue cross blue shield     PT Start Time  1520    PT Stop Time  1600    PT Time Calculation (min)  40 min    Activity Tolerance  Patient tolerated treatment well    Behavior During Therapy  [facility] for tasks assessed/performed       Past Medical History:  Diagnosis Date  . Arthritis    R shoulder, great toes, hands- has gout & has injections in knees with Dr. Estanislado Pandy   . Cancer ([place])    skin ca-   . GERD (gastroesophageal reflux disease)   . History of hiatal hernia   . SDH (subdural hematoma) (HCC)     Past Surgical History:  Procedure Laterality Date  . BRAIN SURGERY  2016   evacuation of SDH  . CARPAL TUNNEL RELEASE Bilateral   . GREEN LIGHT LASER TURP (TRANSURETHRAL RESECTION OF PROSTATE  04/27/2017  . QUADRICEPS TENDON REPAIR Bilateral 06/15/2017   Procedure: REPAIR QUADRICEP TENDON;  Surgeon: Meredith Pel, MD;  Location: [place];  Service: Orthopedics;  Laterality: Bilateral;  . SHOULDER SURGERY Right     There were no vitals filed for this visit.  Subjective Assessment - 04/03/18 1523    Subjective  Patient reports that his pain has gotten better over the past few days and that he has returned to the gym. Patient reports that the pain that he was orginally feeling in his right side has gotten better but he is now experiencing that same pain on the left..    Limitations  Lifting    How long can you stand comfortably?  <62mins     Diagnostic tests  noting post op    Pain Score  4     Pain Location   Knee    Pain Orientation  Right;Left    Pain Descriptors / Indicators  Sore    Pain Onset  More than a month ago    Pain Frequency  Intermittent    Aggravating Factors   standing up from a low surface     Pain Relieving Factors  ice     Effect of Pain on Daily Activities  pain with daily activity     Pain Score  1    Pain Location  Back    Pain Orientation  Right;Left    Pain Descriptors / Indicators  Aching    Pain Type  Chronic pain    Pain Onset  More than a month ago    Pain Frequency  Constant    Aggravating Factors   standing and sitting for long periods of time     Pain Relieving Factors  ice and movement                        [facility] Adult PT Treatment/Exercise - 04/04/18 0001      Lumbar Exercises: Stretches   Single Knee to Chest Stretch  Right;Left;3 reps;20 seconds  Hip Flexor Stretch  Right;2 reps;20 seconds    Other Lumbar Stretch Exercise  glute stretch 3x20 sec hold       Cryotherapy   Cryotherapy Location  Lumbar Spine;Knee      Manual Therapy   Manual therapy comments  IASTM to left lumbar spine and upper gluteals             PT Education - 04/03/18 1535    Education provided  Yes    Education Details  updated HEP and reviewed technique; anatomy of condition    Person(s) Educated  Patient    Methods  Explanation;Demonstration;Tactile cues;Verbal cues    Comprehension  Verbalized understanding;Returned demonstration;Verbal cues required;Tactile cues required       PT Short Term Goals - 04/03/18 1615      PT SHORT TERM GOAL #1   Title  Patient will become independent with HEP for core strength and stretching.    Baseline  Patient independent with HEP    Time  3    Period  Weeks    Status  Achieved    Target Date  03/26/18      PT SHORT TERM GOAL #2   Title  Patient will decrease self reported tenderness in the lumbar paraspinals with palpation.    Baseline  Patient continues to experience tenderness in lumbar paraspinals;  pain at 4/10    Time  3    Period  Weeks    Status  On-going    Target Date  03/26/18      PT SHORT TERM GOAL #3   Title  Patient will increase lumbar extensbility by 25%.    Baseline  slight limitation     Time  3    Period  Weeks    Status  On-going    Target Date  03/26/18        PT Long Term Goals - 03/05/18 0811      PT LONG TERM GOAL #1   Title  Patient will demonstrat a 35% limitaion on FOTO.    Time  6    Period  Weeks    Status  New    Target Date  04/16/18      PT LONG TERM GOAL #2   Title  Pt will increase hip flexor strength from a 4/5 to a 5/5 in order to stand at work greater than 30 mins with no pain.    Time  6    Period  Weeks    Status  New    Target Date  04/16/18      PT LONG TERM GOAL #3   Title  Patient will increase core strength in odert o stabilize the lower back to perform daily functional activities.     Time  6    Period  Weeks    Status  New    Target Date  04/16/18            Plan - 04/03/18 1537    Clinical Impression Statement  Patient was 20 minutes late for his appointment. Patient reported that he is now feeling the same pain that he typically feels in his right lower back in the left lower back. Therapy focused on soft tissue mobilization using IASTM to the left lumbar paraspinals and upper gluteals. Patient tolerated IASTM well and reported relief after treatment. Therapy will continue to moniter bilateral lumbar paraspinals through palapation and patient reported tenderness and strengthening exercises as tolerated.  Clinical Presentation  Stable    Clinical Decision Making  Low    PT Frequency  3x / week    PT Duration  8 weeks    PT Treatment/Interventions  ADLs/Self Care Home Management;Cryotherapy;Electrical Stimulation;Moist Heat;Traction;Ultrasound;Gait Scientist, forensic;Therapeutic activities;Therapeutic exercise;Patient/family education;Manual techniques;Passive range of motion;Dry needling;Taping    PT Next  Visit Plan  soft tissue mobilization to the lower back; progress hip strenghtening exercises; PA mobilizations L3-L5; continue to look at right side if he reports its sore.     PT Home Exercise Plan  SLR; bridge, clamshell; standing hip abduction and extension; standing march     Consulted and Agree with Plan of Care  Patient       Patient will benefit from skilled therapeutic intervention in order to improve the following deficits and impairments:  Abnormal gait, Pain, Increased muscle spasms, Decreased activity tolerance, Decreased endurance, Decreased strength  Visit Diagnosis: Chronic right-sided low back pain without sciatica  Other muscle spasm  Difficulty in walking, not elsewhere classified     Problem List Patient Active Problem List   Diagnosis Date Noted  . Candidiasis   . Abnormality of gait   . Hypoalbuminemia due to protein-calorie malnutrition ([place])   . History of gout   . Sleep disturbance   . Constipation   . Rupture of quadriceps tendon, right, sequela 06/19/2017  . Quadriceps tendon rupture, left, sequela 06/19/2017  . Acute blood loss anemia 06/19/2017  . Fall   . History of subdural hematoma   . Post-operative pain   . Recurrent falls   . Quadriceps tendon rupture 06/15/2017  . Primary osteoarthritis of both knees 02/28/2017  . Primary osteoarthritis of both hands 02/28/2017  . Uricacidemia 02/28/2017  . Pain in joint of left knee 02/07/2017  . Idiopathic chronic gout, unspecified site, without tophus (tophi) 11/29/2016  . HEMATURIA UNSPECIFIED 01/25/2009  . ABDOMINAL PAIN 01/25/2009  . XERODERMA 03/10/2008  . UNS ADVRS EFF UNS RX MEDICINAL&BIOLOGICAL SBSTNC 03/10/2008  . ADJUSTMENT REACTION WITH PHYSICAL SYMPTOMS 02/14/2008  . MUSCLE SPASM 02/14/2008  . ACUTE PROSTATITIS 12/16/2007  . BREAST LUMP OR MASS, RIGHT 07/12/2007  . H/O: RCT (rotator cuff tear) 01/13/2007  . GOUT 01/08/2007   Carolyne Littles PT DPT  04/04/2018  Carney Living SPT   04/04/2018, 8:01 AM  During this treatment session, the therapist was present, participating in and directing the treatment.   [place], [place], [address] Phone: [phone]   Fax:  [phone]  Name: DANIELA HERNAN MRN: [id number] Date of Birth: 02/24/1951

## 2018-04-08 ENCOUNTER — Ambulatory Visit: Payer: Medicare Other | Admitting: Physical Therapy

## 2018-04-08 DIAGNOSIS — M545 Low back pain, unspecified: Secondary | ICD-10-CM

## 2018-04-08 DIAGNOSIS — M62838 Other muscle spasm: Secondary | ICD-10-CM

## 2018-04-08 DIAGNOSIS — R262 Difficulty in walking, not elsewhere classified: Secondary | ICD-10-CM | POA: Diagnosis not present

## 2018-04-08 DIAGNOSIS — G8929 Other chronic pain: Secondary | ICD-10-CM | POA: Diagnosis not present

## 2018-04-08 DIAGNOSIS — F438 Other reactions to severe stress: Secondary | ICD-10-CM | POA: Diagnosis not present

## 2018-04-08 DIAGNOSIS — M25561 Pain in right knee: Secondary | ICD-10-CM | POA: Diagnosis not present

## 2018-04-08 DIAGNOSIS — M25562 Pain in left knee: Secondary | ICD-10-CM | POA: Diagnosis not present

## 2018-04-09 ENCOUNTER — Encounter: Payer: Self-pay | Admitting: Physical Therapy

## 2018-04-09 NOTE — Therapy (Signed)
[place], [place], [address] Phone: [phone]   Fax:  [phone]  Physical Therapy Treatment  Patient Details  Name: Jose [name] MRN: [id number] Date of Birth: Jul 18, 1951 Referring Provider: Dr Heather Roberts    Encounter Date: 04/08/2018  PT End of Session - 04/08/18 1505    Visit Number  12    Number of Visits  18    Date for PT Re-Evaluation  04/15/18    Authorization Type  Blue cross blue shield     PT Start Time  1500    PT Stop Time  1540    PT Time Calculation (min)  40 min    Activity Tolerance  Patient tolerated treatment well    Behavior During Therapy  [facility] for tasks assessed/performed       Past Medical History:  Diagnosis Date  . Arthritis    R shoulder, great toes, hands- has gout & has injections in knees with Dr. Estanislado Pandy   . Cancer ([place])    skin ca-   . GERD (gastroesophageal reflux disease)   . History of hiatal hernia   . SDH (subdural hematoma) (HCC)     Past Surgical History:  Procedure Laterality Date  . BRAIN SURGERY  2016   evacuation of SDH  . CARPAL TUNNEL RELEASE Bilateral   . GREEN LIGHT LASER TURP (TRANSURETHRAL RESECTION OF PROSTATE  04/27/2017  . QUADRICEPS TENDON REPAIR Bilateral 06/15/2017   Procedure: REPAIR QUADRICEP TENDON;  Surgeon: Meredith Pel, MD;  Location: [place];  Service: Orthopedics;  Laterality: Bilateral;  . SHOULDER SURGERY Right     There were no vitals filed for this visit.  Subjective Assessment - 04/08/18 1503    Subjective  Patient his pain is predomintly on the left side today. Patient states that his pain has been about the same over the weekend.     Limitations  Lifting    How long can you stand comfortably?  <21mins     Diagnostic tests  noting post op    Pain Score  5    Pain Location  Back    Pain Orientation  Left    Pain Descriptors / Indicators  Aching    Pain Type  Chronic pain    Pain Onset  More than a  month ago    Pain Frequency  Constant    Aggravating Factors   standing and sitting for long periods of time     Pain Relieving Factors  ice and movement                       [facility] Adult PT Treatment/Exercise - 04/09/18 0001      Lumbar Exercises: Stretches   Single Knee to Chest Stretch  Right;Left;3 reps;20 seconds    Hip Flexor Stretch  Right;2 reps;20 seconds    Other Lumbar Stretch Exercise  glute stretch 3x20 sec hold       Lumbar Exercises: Supine   Clam  Limitations;20 reps    Clam Limitations  Green band    Bridge Limitations  2x10     Other Supine Lumbar Exercises  SLR 2x10; bilateral       Manual Therapy   Manual therapy comments  IASTM to left lumbar spine and upper gluteals    Manual Traction  LAD of right lower extremity 5x30 sec hold        Trigger Point Dry Needling - 04/09/18 1312  Consent Given?  Yes    Education Handout Provided  Yes    Muscles Treated Lower Body  Gluteus minimus    Longissimus Response  -- glut medius and maximus           PT Education - 04/08/18 1505    Education provided  Yes    Education Details  reviewed HEP    Person(s) Educated  Patient    Methods  Explanation;Demonstration;Tactile cues    Comprehension  Verbalized understanding;Need further instruction       PT Short Term Goals - 04/03/18 1615      PT SHORT TERM GOAL #1   Title  Patient will become independent with HEP for core strength and stretching.    Baseline  Patient independent with HEP    Time  3    Period  Weeks    Status  Achieved    Target Date  03/26/18      PT SHORT TERM GOAL #2   Title  Patient will decrease self reported tenderness in the lumbar paraspinals with palpation.    Baseline  Patient continues to experience tenderness in lumbar paraspinals; pain at 4/10    Time  3    Period  Weeks    Status  On-going    Target Date  03/26/18      PT SHORT TERM GOAL #3   Title  Patient will increase lumbar extensbility by 25%.     Baseline  slight limitation     Time  3    Period  Weeks    Status  On-going    Target Date  03/26/18        PT Long Term Goals - 03/05/18 0811      PT LONG TERM GOAL #1   Title  Patient will demonstrat a 35% limitaion on FOTO.    Time  6    Period  Weeks    Status  New    Target Date  04/16/18      PT LONG TERM GOAL #2   Title  Pt will increase hip flexor strength from a 4/5 to a 5/5 in order to stand at work greater than 30 mins with no pain.    Time  6    Period  Weeks    Status  New    Target Date  04/16/18      PT LONG TERM GOAL #3   Title  Patient will increase core strength in odert o stabilize the lower back to perform [name] functional activities.     Time  6    Period  Weeks    Status  New    Target Date  04/16/18            Plan - 04/08/18 1506    Clinical Impression Statement  Patient tolerated trigger point dry needling well. He had a good twtich repose with three needles to the glut medius. He tolerated exercises and stretches well. Likely D/C next visit to HEP.     Clinical Presentation  Stable    Clinical Decision Making  Low    Rehab Potential  Good    PT Frequency  3x / week    PT Duration  8 weeks    PT Treatment/Interventions  ADLs/Self Care Home Management;Cryotherapy;Electrical Stimulation;Moist Heat;Traction;Ultrasound;Gait Scientist, forensic;Therapeutic activities;Therapeutic exercise;Patient/family education;Manual techniques;Passive range of motion;Dry needling;Taping    PT Next Visit Plan  soft tissue mobilization to the lower back; progress hip strenghtening exercises; PA mobilizations L3-L5;  continue to look at right side if he reports its sore.     PT Home Exercise Plan  SLR; bridge, clamshell; standing hip abduction and extension; standing march     Consulted and Agree with Plan of Care  Patient       Patient will benefit from skilled therapeutic intervention in order to improve the following deficits and impairments:  Abnormal  gait, Pain, Increased muscle spasms, Decreased activity tolerance, Decreased endurance, Decreased strength  Visit Diagnosis: Chronic right-sided low back pain without sciatica  Other muscle spasm  Difficulty in walking, not elsewhere classified     Problem List Patient Active Problem List   Diagnosis Date Noted  . Candidiasis   . Abnormality of gait   . Hypoalbuminemia due to protein-calorie malnutrition ([place])   . History of gout   . Sleep disturbance   . Constipation   . Rupture of quadriceps tendon, right, sequela 06/19/2017  . Quadriceps tendon rupture, left, sequela 06/19/2017  . Acute blood loss anemia 06/19/2017  . Fall   . History of subdural hematoma   . Post-operative pain   . Recurrent falls   . Quadriceps tendon rupture 06/15/2017  . Primary osteoarthritis of both knees 02/28/2017  . Primary osteoarthritis of both hands 02/28/2017  . Uricacidemia 02/28/2017  . Pain in joint of left knee 02/07/2017  . Idiopathic chronic gout, unspecified site, without tophus (tophi) 11/29/2016  . HEMATURIA UNSPECIFIED 01/25/2009  . ABDOMINAL PAIN 01/25/2009  . XERODERMA 03/10/2008  . UNS ADVRS EFF UNS RX MEDICINAL&BIOLOGICAL SBSTNC 03/10/2008  . ADJUSTMENT REACTION WITH PHYSICAL SYMPTOMS 02/14/2008  . MUSCLE SPASM 02/14/2008  . ACUTE PROSTATITIS 12/16/2007  . BREAST LUMP OR MASS, RIGHT 07/12/2007  . H/O: RCT (rotator cuff tear) 01/13/2007  . GOUT 01/08/2007    Carney Living DPT  04/09/2018, 1:13 PM  Cooper Render SPT  04/09/2018  [facility] [place], [place], [address] Phone: [phone]   Fax:  [phone]  Name: Jose [name] MRN: [id number] Date of Birth: December 25, 1950

## 2018-04-10 ENCOUNTER — Ambulatory Visit: Payer: Medicare Other | Admitting: Physical Therapy

## 2018-04-10 ENCOUNTER — Encounter: Payer: Self-pay | Admitting: Physical Therapy

## 2018-04-10 DIAGNOSIS — R262 Difficulty in walking, not elsewhere classified: Secondary | ICD-10-CM | POA: Diagnosis not present

## 2018-04-10 DIAGNOSIS — M545 Low back pain, unspecified: Secondary | ICD-10-CM

## 2018-04-10 DIAGNOSIS — M62838 Other muscle spasm: Secondary | ICD-10-CM

## 2018-04-10 DIAGNOSIS — M25562 Pain in left knee: Secondary | ICD-10-CM | POA: Diagnosis not present

## 2018-04-10 DIAGNOSIS — M25561 Pain in right knee: Secondary | ICD-10-CM | POA: Diagnosis not present

## 2018-04-10 DIAGNOSIS — G8929 Other chronic pain: Secondary | ICD-10-CM

## 2018-04-11 NOTE — Therapy (Signed)
[place], [place], [address] Phone: [phone]   Fax:  [phone]  Physical Therapy Treatment  Patient Details  Name: Jose [name] MRN: [id number] Date of Birth: 10/27/1951 Referring Provider: Dr Heather Roberts    Encounter Date: 04/10/2018  PT End of Session - 04/10/18 1507    Visit Number  13    Number of Visits  18    Date for PT Re-Evaluation  04/15/18    Authorization Type  Blue cross blue shield     PT Start Time  1505    PT Stop Time  1555    PT Time Calculation (min)  50 min    Activity Tolerance  Patient tolerated treatment well    Behavior During Therapy  [facility] for tasks assessed/performed       Past Medical History:  Diagnosis Date  . Arthritis    R shoulder, great toes, hands- has gout & has injections in knees with Dr. Estanislado Pandy   . Cancer ([place])    skin ca-   . GERD (gastroesophageal reflux disease)   . History of hiatal hernia   . SDH (subdural hematoma) (HCC)     Past Surgical History:  Procedure Laterality Date  . BRAIN SURGERY  2016   evacuation of SDH  . CARPAL TUNNEL RELEASE Bilateral   . GREEN LIGHT LASER TURP (TRANSURETHRAL RESECTION OF PROSTATE  04/27/2017  . QUADRICEPS TENDON REPAIR Bilateral 06/15/2017   Procedure: REPAIR QUADRICEP TENDON;  Surgeon: Meredith Pel, MD;  Location: [place];  Service: Orthopedics;  Laterality: Bilateral;  . SHOULDER SURGERY Right     There were no vitals filed for this visit.  Subjective Assessment - 04/10/18 1508    Subjective  Patient reports that he is still feeling more discomfort on the left side than on the right side. Patient states that he was not sore after last therapy visit and has been to the gym since then.     Limitations  Lifting    How long can you stand comfortably?  <55mins     Diagnostic tests  noting post op    Pain Score  3     Pain Location  Knee    Pain Orientation  Right;Left    Pain Descriptors /  Indicators  Sore    Pain Type  Acute pain    Pain Onset  More than a month ago    Pain Frequency  Intermittent    Aggravating Factors   standing up from a low surface     Pain Relieving Factors  ice     Effect of Pain on Daily Activities  pain with daily activites     Pain Score  3    Pain Location  Back    Pain Orientation  Left    Pain Descriptors / Indicators  Aching    Pain Type  Chronic pain    Pain Onset  More than a month ago    Pain Frequency  Constant    Aggravating Factors   standing and sitting for long periods of time     Pain Relieving Factors  ice and movement          [facility] PT Assessment - 04/11/18 0001      AROM   Lumbar Flexion  25% limited     Lumbar Extension  50%     Lumbar - Right Side Bend  pain on the left side with right SB  Lumbar - Left Side Bend  no limit     Lumbar - Right Rotation  no limit     Lumbar - Left Rotation  no limit       Strength   Right Hip Flexion  4/5    Right Hip ABduction  5/5    Right Hip ADduction  5/5    Left Hip Flexion  4+/5    Left Hip ABduction  4+/5    Left Hip ADduction  4+/5    Right Knee Flexion  4+/5    Right Knee Extension  4+/5    Left Knee Flexion  4+/5    Left Knee Extension  4-/5                   [facility] Adult PT Treatment/Exercise - 04/11/18 0001      Lumbar Exercises: Stretches   Single Knee to Chest Stretch  Right;Left;3 reps;20 seconds    Hip Flexor Stretch  Right;2 reps;20 seconds    Other Lumbar Stretch Exercise  glute stretch 3x20 sec hold       Lumbar Exercises: Supine   Clam  Limitations;20 reps    Clam Limitations  Green band    Bridge Limitations  2x10     Other Supine Lumbar Exercises  SLR 2x10; bilateral       Cryotherapy   Number Minutes Cryotherapy  10 Minutes    Cryotherapy Location  Lumbar Spine;Knee    Type of Cryotherapy  Ice pack      Manual Therapy   Manual therapy comments  IASTM to left lumbar spine and upper gluteals    Manual Traction  LAD of right lower  extremity 5x30 sec hold              PT Education - 04/10/18 1707    Education provided  Yes    Education Details  trigger point referral patterns     Person(s) Educated  Patient    Methods  Explanation;Demonstration;Tactile cues;Verbal cues    Comprehension  Verbalized understanding       PT Short Term Goals - 04/11/18 0752      PT SHORT TERM GOAL #1   Title  Patient will become independent with HEP for core strength and stretching.    Baseline  Patient independent with HEP    Time  3    Period  Weeks    Status  Achieved    Target Date  03/26/18      PT SHORT TERM GOAL #2   Title  Patient will decrease self reported tenderness in the lumbar paraspinals with palpation.    Baseline  Patient continues to experience tenderness in lumbar paraspinals; pain at 4/10    Time  3    Period  Weeks    Status  On-going    Target Date  03/26/18      PT SHORT TERM GOAL #3   Title  Patient will increase lumbar extensbility by 25%.    Baseline  slight limitation     Time  3    Period  Weeks    Status  On-going    Target Date  03/26/18        PT Long Term Goals - 03/05/18 0811      PT LONG TERM GOAL #1   Title  Patient will demonstrat a 35% limitaion on FOTO.    Time  6    Period  Weeks    Status  New  Target Date  04/16/18      PT LONG TERM GOAL #2   Title  Pt will increase hip flexor strength from a 4/5 to a 5/5 in order to stand at work greater than 30 mins with no pain.    Time  6    Period  Weeks    Status  New    Target Date  04/16/18      PT LONG TERM GOAL #3   Title  Patient will increase core strength in odert o stabilize the lower back to perform daily functional activities.     Time  6    Period  Weeks    Status  New    Target Date  04/16/18            Plan - 04/11/18 0753    Clinical Impression Statement  Patient has been reporting that the pain that he originally felt in the right lower back and hip is now being felt on the left side after a  weekend of prolonged standing for work, as well as and increase in R knee pain. Therapy has addressed the left side for the past few visits and patient has reported a decrease in tenderness of the left lumbar paraspinals and upper gluteals. Patient's hip strength has remained consistent throughout treatment. Patient wishes too continue therapy in order to strengthening core and hips.     Clinical Presentation  Stable    Clinical Decision Making  Low    Rehab Potential  Good    PT Frequency  3x / week    PT Duration  8 weeks    PT Treatment/Interventions  ADLs/Self Care Home Management;Cryotherapy;Electrical Stimulation;Moist Heat;Traction;Ultrasound;Gait Scientist, forensic;Therapeutic activities;Therapeutic exercise;Patient/family education;Manual techniques;Passive range of motion;Dry needling;Taping    PT Next Visit Plan  soft tissue mobilization to the lower back; progress hip strenghtening exercises; PA mobilizations L3-L5; continue to look at right side if he reports its sore.     PT Home Exercise Plan  SLR; bridge, clamshell; standing hip abduction and extension; standing march     Consulted and Agree with Plan of Care  Patient       Patient will benefit from skilled therapeutic intervention in order to improve the following deficits and impairments:  Abnormal gait, Pain, Increased muscle spasms, Decreased activity tolerance, Decreased endurance, Decreased strength  Visit Diagnosis: Chronic right-sided low back pain without sciatica  Other muscle spasm  Difficulty in walking, not elsewhere classified     Problem List Patient Active Problem List   Diagnosis Date Noted  . Candidiasis   . Abnormality of gait   . Hypoalbuminemia due to protein-calorie malnutrition ([place])   . History of gout   . Sleep disturbance   . Constipation   . Rupture of quadriceps tendon, right, sequela 06/19/2017  . Quadriceps tendon rupture, left, sequela 06/19/2017  . Acute blood loss anemia  06/19/2017  . Fall   . History of subdural hematoma   . Post-operative pain   . Recurrent falls   . Quadriceps tendon rupture 06/15/2017  . Primary osteoarthritis of both knees 02/28/2017  . Primary osteoarthritis of both hands 02/28/2017  . Uricacidemia 02/28/2017  . Pain in joint of left knee 02/07/2017  . Idiopathic chronic gout, unspecified site, without tophus (tophi) 11/29/2016  . HEMATURIA UNSPECIFIED 01/25/2009  . ABDOMINAL PAIN 01/25/2009  . XERODERMA 03/10/2008  . UNS ADVRS EFF UNS RX MEDICINAL&BIOLOGICAL SBSTNC 03/10/2008  . ADJUSTMENT REACTION WITH PHYSICAL SYMPTOMS 02/14/2008  . MUSCLE SPASM 02/14/2008  .  ACUTE PROSTATITIS 12/16/2007  . BREAST LUMP OR MASS, RIGHT 07/12/2007  . H/O: RCT (rotator cuff tear) 01/13/2007  . GOUT 01/08/2007    Jose [name] 04/11/2018, 8:07 AM  [facility] [address] [place], [place], [address] Phone: [phone]   Fax:  [phone]  Name: Jose [name] MRN: [id number] Date of Birth: 10-Oct-1951

## 2018-04-15 ENCOUNTER — Telehealth (INDEPENDENT_AMBULATORY_CARE_PROVIDER_SITE_OTHER): Payer: Self-pay | Admitting: Orthopedic Surgery

## 2018-04-15 NOTE — Telephone Encounter (Signed)
Has been going to P.T. For his lumbar spine. Pain has shifted from the right side to the left side but they have been working on it in therapy. He has started going to the gym as well. However, he states that his knees are not doing well. Reports that the right knee is extremely painful, Left knee giving way a lot, Wearing hinged knee brace on the left.  Wants rx to return to PT for bilateral knees. Also wanted OV to see Dr Marlou Sa. Worked him in for Thursday morning.

## 2018-04-15 NOTE — Telephone Encounter (Signed)
Patient called asking for a call back, no details left. # [phone]

## 2018-04-16 DIAGNOSIS — C4441 Basal cell carcinoma of skin of scalp and neck: Secondary | ICD-10-CM | POA: Diagnosis not present

## 2018-04-16 DIAGNOSIS — L570 Actinic keratosis: Secondary | ICD-10-CM | POA: Diagnosis not present

## 2018-04-18 ENCOUNTER — Encounter: Payer: Self-pay | Admitting: Physical Therapy

## 2018-04-18 ENCOUNTER — Ambulatory Visit (INDEPENDENT_AMBULATORY_CARE_PROVIDER_SITE_OTHER): Payer: Medicare Other | Admitting: Orthopedic Surgery

## 2018-04-18 ENCOUNTER — Ambulatory Visit: Payer: Medicare Other | Attending: Orthopedic Surgery | Admitting: Physical Therapy

## 2018-04-18 ENCOUNTER — Encounter (INDEPENDENT_AMBULATORY_CARE_PROVIDER_SITE_OTHER): Payer: Self-pay | Admitting: Orthopedic Surgery

## 2018-04-18 DIAGNOSIS — M25562 Pain in left knee: Secondary | ICD-10-CM | POA: Insufficient documentation

## 2018-04-18 DIAGNOSIS — S76111S Strain of right quadriceps muscle, fascia and tendon, sequela: Secondary | ICD-10-CM | POA: Diagnosis not present

## 2018-04-18 DIAGNOSIS — M62838 Other muscle spasm: Secondary | ICD-10-CM | POA: Diagnosis not present

## 2018-04-18 DIAGNOSIS — M25561 Pain in right knee: Secondary | ICD-10-CM | POA: Diagnosis not present

## 2018-04-18 DIAGNOSIS — R2689 Other abnormalities of gait and mobility: Secondary | ICD-10-CM | POA: Diagnosis not present

## 2018-04-18 DIAGNOSIS — R262 Difficulty in walking, not elsewhere classified: Secondary | ICD-10-CM | POA: Diagnosis not present

## 2018-04-18 DIAGNOSIS — X58XXXS Exposure to other specified factors, sequela: Secondary | ICD-10-CM | POA: Diagnosis not present

## 2018-04-18 DIAGNOSIS — G8929 Other chronic pain: Secondary | ICD-10-CM | POA: Diagnosis not present

## 2018-04-18 DIAGNOSIS — M545 Low back pain, unspecified: Secondary | ICD-10-CM

## 2018-04-18 MED ORDER — HYDROCODONE-ACETAMINOPHEN 5-325 MG PO TABS: ORAL_TABLET | ORAL | 0 refills | Status: DC

## 2018-04-18 NOTE — Therapy (Signed)
[place], [place], [address] Phone: [phone]   Fax:  [phone]  Physical Therapy Treatment  Patient Details  Name: Jose [name] MRN: [id number] Date of Birth: Aug 21, 1951 Referring Provider: Dr Heather Roberts    Encounter Date: 04/18/2018  PT End of Session - 04/18/18 0900    Visit Number  14    Number of Visits  23    Date for PT Re-Evaluation  05/16/18    PT Start Time  0803    PT Stop Time  0904    PT Time Calculation (min)  61 min    Activity Tolerance  Patient tolerated treatment well    Behavior During Therapy  [facility] for tasks assessed/performed       Past Medical History:  Diagnosis Date  . Arthritis    R shoulder, great toes, hands- has gout & has injections in knees with Dr. Estanislado Pandy   . Cancer ([place])    skin ca-   . GERD (gastroesophageal reflux disease)   . History of hiatal hernia   . SDH (subdural hematoma) (HCC)     Past Surgical History:  Procedure Laterality Date  . BRAIN SURGERY  2016   evacuation of SDH  . CARPAL TUNNEL RELEASE Bilateral   . GREEN LIGHT LASER TURP (TRANSURETHRAL RESECTION OF PROSTATE  04/27/2017  . QUADRICEPS TENDON REPAIR Bilateral 06/15/2017   Procedure: REPAIR QUADRICEP TENDON;  Surgeon: Meredith Pel, MD;  Location: [place];  Service: Orthopedics;  Laterality: Bilateral;  . SHOULDER SURGERY Right     There were no vitals filed for this visit.  Subjective Assessment - 04/18/18 0811    Subjective  Had a  surgery on Tuesday to remove a spot on head.   I have been working out at the gym my whole body.  My knees have been pretty good since i have been here last.    Currently in Pain?  No/denies    Pain Location  Knee    Pain Orientation  Right;Left    Pain Descriptors / Indicators  Aching achey in the morning ,  leaves as he gets going    Pain Frequency  Intermittent    Aggravating Factors   getting up in the morning    Pain Relieving Factors   moving around,  ice    Pain Score  -- under a 3/10    Pain Location  Back    Pain Orientation  Left    Pain Type  Chronic pain    Aggravating Factors   did not answer question clearly answered    Pain Relieving Factors  ice and movement                       [facility] Adult PT Treatment/Exercise - 04/18/18 0001      Lumbar Exercises: Seated   Other Seated Lumbar Exercises  On green ball sitting  various ex:  pelvic mobility movement isolation, bounce in neutral,  lateral and anterior posterior tilts,  figure 8 laterally and anterior posterior,  Cued SBA      Lumbar Exercises: Supine   Single Leg Bridge  10 reps each, challanging HEP      Lumbar Exercises: Quadruped   Straight Leg Raise  10 reps HEP,  cued challanging    Straight Leg Raises Limitations  bent knee, 10 x each,  cued HEP challanging      Knee/Hip Exercises: Standing   Functional Squat  5 sets 2 steps to right and left,  small lowering, Cued,  HEP    Functional Squat Limitations  Challanging      Knee/Hip Exercises: Sidelying   Clams  1o x each  pilates style,  HEP      Cryotherapy   Number Minutes Cryotherapy  10 Minutes    Cryotherapy Location  Lumbar Spine;Knee    Type of Cryotherapy  -- cold packs             PT Education - 04/18/18 0900    Education provided  Yes    Education Details  HEP    Person(s) Educated  Patient    Methods  Explanation;Demonstration;Tactile cues;Verbal cues;Handout    Comprehension  Verbalized understanding;Returned demonstration       PT Short Term Goals - 04/11/18 0752      PT SHORT TERM GOAL #1   Title  Patient will become independent with HEP for core strength and stretching.    Baseline  Patient independent with HEP    Time  3    Period  Weeks    Status  Achieved    Target Date  03/26/18      PT SHORT TERM GOAL #2   Title  Patient will decrease self reported tenderness in the lumbar paraspinals with palpation.    Baseline  Patient continues to  experience tenderness in lumbar paraspinals; pain at 4/10    Time  3    Period  Weeks    Status  On-going    Target Date  03/26/18      PT SHORT TERM GOAL #3   Title  Patient will increase lumbar extensbility by 25%.    Baseline  slight limitation     Time  3    Period  Weeks    Status  On-going    Target Date  03/26/18        PT Long Term Goals - 04/11/18 0929      PT LONG TERM GOAL #1   Title  Patient will demonstrat a 35% limitaion on FOTO.    Baseline  58% limitation     Time  6    Period  Weeks    Status  On-going      PT LONG TERM GOAL #2   Title  Pt will increase hip flexor strength from a 4/5 to a 5/5 in order to stand at work greater than 30 mins with no pain.    Baseline  no increase in strength     Time  6    Period  Weeks    Status  On-going      PT LONG TERM GOAL #3   Title  Patient will increase core strength in odert o stabilize the lower back to perform daily functional activities.     Baseline  occasionally wakes with pain in the night    Time  6    Period  Weeks    Status  On-going      PT LONG TERM GOAL #4   Title  Patient will stand for 1 hour without increased pain in order to perfrom daily tasks    Baseline  able    Time  8    Period  Weeks    Status  On-going            Plan - 04/18/18 0901    Clinical Impression Statement  Pain in knees 4/10 at end of session from exercise. No mention  of increased back pain by patient post session.  Patient was able to progress to more challanging hip strengthening exercises with mild increase in knee pain.  Today we worked toward goals of strengthening for core and hips with exercise. No new goals met    PT Next Visit Plan  review new HEP (hip strengthening)    PT Home Exercise Plan  SLR; bridge, clamshell; standing hip abduction and extension; standing march , single leg bridge, clam,quadriped (on elbows/knees)  straight leg and bent leg lifts, minisquats with side steps.    Consulted and Agree with  Plan of Care  Patient       Patient will benefit from skilled therapeutic intervention in order to improve the following deficits and impairments:     Visit Diagnosis: Chronic right-sided low back pain without sciatica  Other muscle spasm  Difficulty in walking, not elsewhere classified  Acute pain of right knee  Acute pain of left knee  Other abnormalities of gait and mobility     Problem List Patient Active Problem List   Diagnosis Date Noted  . Candidiasis   . Abnormality of gait   . Hypoalbuminemia due to protein-calorie malnutrition ([place])   . History of gout   . Sleep disturbance   . Constipation   . Rupture of quadriceps tendon, right, sequela 06/19/2017  . Quadriceps tendon rupture, left, sequela 06/19/2017  . Acute blood loss anemia 06/19/2017  . Fall   . History of subdural hematoma   . Post-operative pain   . Recurrent falls   . Quadriceps tendon rupture 06/15/2017  . Primary osteoarthritis of both knees 02/28/2017  . Primary osteoarthritis of both hands 02/28/2017  . Uricacidemia 02/28/2017  . Pain in joint of left knee 02/07/2017  . Idiopathic chronic gout, unspecified site, without tophus (tophi) 11/29/2016  . HEMATURIA UNSPECIFIED 01/25/2009  . ABDOMINAL PAIN 01/25/2009  . XERODERMA 03/10/2008  . UNS ADVRS EFF UNS RX MEDICINAL&BIOLOGICAL SBSTNC 03/10/2008  . ADJUSTMENT REACTION WITH PHYSICAL SYMPTOMS 02/14/2008  . MUSCLE SPASM 02/14/2008  . ACUTE PROSTATITIS 12/16/2007  . BREAST LUMP OR MASS, RIGHT 07/12/2007  . H/O: RCT (rotator cuff tear) 01/13/2007  . GOUT 01/08/2007    Kailand Seda  PTA 04/18/2018, 9:08 AM  [facility] [address] [place], [place], [address] Phone: [phone]   Fax:  [phone]  Name: Jose [name] MRN: [id number] Date of Birth: [birth year]

## 2018-04-18 NOTE — Patient Instructions (Signed)
Issued from exercise drawer: JOSPT hip strengthening All issued Every other day 10 X each Pause vs holding

## 2018-04-20 ENCOUNTER — Encounter (INDEPENDENT_AMBULATORY_CARE_PROVIDER_SITE_OTHER): Payer: Self-pay | Admitting: Orthopedic Surgery

## 2018-04-20 NOTE — Progress Notes (Signed)
Office Visit Note   Patient: Jose [name]           Date of Birth: 09/01/1951           MRN: [id number] Visit Date: 04/18/2018 Requested by: [name], MD [address] [place], [place] [address] PCP: [name], MD  Subjective: Chief Complaint  Patient presents with  . Right Knee - Pain  . Left Knee - Pain    HPI: Jose [name] is a patient with left greater than right knee pain.  Had bilateral quad tendon rupture repair done in July 2018.  Is been going to physical therapy for his back.  He states his left knee is giving way.  Has a brace.  Had a fairly large chronic tear of the quad tendon on the left-hand side prior to falling and having both tendons rupture completely.  He has been going to the gym and feels stronger.  He is in therapy.  He is fairly functional with both legs but the left leg is keeping him from being as active as he would like to be.              ROS: All systems reviewed are negative as they relate to the chief complaint within the history of present illness.  Patient denies  fevers or chills.   Assessment & Plan: Visit Diagnoses:  1. Acute pain of left knee     Plan: Impression is well-functioning right quad tendon rupture repair.  Has about a 5 degree lag but excellent strength and no palpable defect in the tendon.  No effusion in the right knee.  On the left-hand side I think he is getting a progressive tearing of the repair.  Looks a little bit worse this clinic visit last clinic visit he still has functional leg extension with about a 10 degree extensor lag.  Nonetheless that palpable defect is slightly larger today than it was before indicating continued retraction of that quad tendon.  I think Mert would do well to have some type of interpositional grafting performed to try to get the retracted quad tendon more involved in the knee if extension.  We talked about hamstring tendon but at this point with giving way I do not think I want to use  own tissue due to the possibility of weakening further the strength in the leg.  I think I would favor some type of interpositional graft the type which is used for superior capsular reconstruction comes to mind.  That would potentially at least link and restore a percentage of his quad function back to the patella.  Would like to do this sooner rather than later.  He will consider his options and call me once he wants to get that scheduled.  One-time prescription for Norco written.  He really has not been taking that much of it.  Follow-Up Instructions: Return if symptoms worsen or fail to improve.   Orders:  No orders of the defined types were placed in this encounter.  Meds ordered this encounter  Medications  . HYDROcodone-acetaminophen (NORCO/VICODIN) 5-325 MG tablet    Sig: 1 po bid prn pain    Dispense:  30 tablet    Refill:  0      Procedures: No procedures performed   Clinical Data: No additional findings.  Objective: Vital Signs: There were no vitals taken for this visit.  Physical Exam:   Constitutional: Patient appears well-developed HEENT:  Head: Normocephalic Eyes:EOM are normal Neck: Normal  range of motion Cardiovascular: Normal rate Pulmonary/chest: Effort normal Neurologic: Patient is alert Skin: Skin is warm Psychiatric: Patient has normal mood and affect    Ortho Exam: Orthopedic exam demonstrates full active and passive range of motion of the right leg.  On the left-hand side he is got about a 10 to 15 degrees extensor lag but can actively extend and flex the leg.  No effusion of the right or left knee.  Pedal pulses palpable.  No groin pain with internal/external rotation of the leg.  He did have a small defect laterally last clinic visit but that is extended across about 5 or 6 4 mm this visit compared to where he was before.  Mild quad atrophy present on the left compared to the right.  Again the patient has about 4+ out of 5 leg extension strength on  the left-hand side.  5- out of 5 on the right-hand side.  Specialty Comments:  No specialty comments available.  Imaging: No results found.   PMFS History: Patient Active Problem List   Diagnosis Date Noted  . Candidiasis   . Abnormality of gait   . Hypoalbuminemia due to protein-calorie malnutrition ([place])   . History of gout   . Sleep disturbance   . Constipation   . Rupture of quadriceps tendon, right, sequela 06/19/2017  . Quadriceps tendon rupture, left, sequela 06/19/2017  . Acute blood loss anemia 06/19/2017  . Fall   . History of subdural hematoma   . Post-operative pain   . Recurrent falls   . Quadriceps tendon rupture 06/15/2017  . Primary osteoarthritis of both knees 02/28/2017  . Primary osteoarthritis of both hands 02/28/2017  . Uricacidemia 02/28/2017  . Pain in joint of left knee 02/07/2017  . Idiopathic chronic gout, unspecified site, without tophus (tophi) 11/29/2016  . HEMATURIA UNSPECIFIED 01/25/2009  . ABDOMINAL PAIN 01/25/2009  . XERODERMA 03/10/2008  . UNS ADVRS EFF UNS RX MEDICINAL&BIOLOGICAL SBSTNC 03/10/2008  . ADJUSTMENT REACTION WITH PHYSICAL SYMPTOMS 02/14/2008  . MUSCLE SPASM 02/14/2008  . ACUTE PROSTATITIS 12/16/2007  . BREAST LUMP OR MASS, RIGHT 07/12/2007  . H/O: RCT (rotator cuff tear) 01/13/2007  . GOUT 01/08/2007   Past Medical History:  Diagnosis Date  . Arthritis    R shoulder, great toes, hands- has gout & has injections in knees with Dr. Estanislado Pandy   . Cancer ([facility])    skin ca-   . GERD (gastroesophageal reflux disease)   . History of hiatal hernia   . SDH (subdural hematoma) ([facility])     Family History  Problem Relation Age of Onset  . Parkinson's disease Mother   . Heart disease Father   . Parkinson's disease Brother   . Diabetes Brother     Past Surgical History:  Procedure Laterality Date  . BRAIN SURGERY  2016   evacuation of SDH  . CARPAL TUNNEL RELEASE Bilateral   . GREEN LIGHT LASER TURP (TRANSURETHRAL RESECTION  OF PROSTATE  04/27/2017  . QUADRICEPS TENDON REPAIR Bilateral 06/15/2017   Procedure: REPAIR QUADRICEP TENDON;  Surgeon: Meredith Pel, MD;  Location: [facility];  Service: Orthopedics;  Laterality: Bilateral;  . SHOULDER SURGERY Right    Social History   Occupational History  . Not on file  Tobacco Use  . Smoking status: Light Tobacco Smoker    Types: Cigars  . Smokeless tobacco: Never Used  . Tobacco comment: celebration use   Substance and Sexual Activity  . Alcohol use: Yes    Alcohol/week: 1.8 oz  Types: 2 Glasses of wine, 1 Shots of liquor per week    Comment: 2 drinks per day  . Drug use: No  . Sexual activity: Not on file

## 2018-04-22 ENCOUNTER — Telehealth (INDEPENDENT_AMBULATORY_CARE_PROVIDER_SITE_OTHER): Payer: Self-pay | Admitting: Orthopedic Surgery

## 2018-04-22 DIAGNOSIS — F438 Other reactions to severe stress: Secondary | ICD-10-CM | POA: Diagnosis not present

## 2018-04-22 NOTE — Telephone Encounter (Signed)
Corene Cornea @ [facility] called checking status of request from April. I told him records were mailed 4/9. He has not received them. I emailed records to him [email or web address]. Ph [phone] ext [phone]

## 2018-04-23 ENCOUNTER — Encounter: Payer: Medicare Other | Admitting: Physical Therapy

## 2018-04-25 ENCOUNTER — Encounter: Payer: Self-pay | Admitting: Physical Therapy

## 2018-04-25 ENCOUNTER — Ambulatory Visit: Payer: Medicare Other | Admitting: Physical Therapy

## 2018-04-25 DIAGNOSIS — G8929 Other chronic pain: Secondary | ICD-10-CM | POA: Diagnosis not present

## 2018-04-25 DIAGNOSIS — M545 Low back pain, unspecified: Secondary | ICD-10-CM

## 2018-04-25 DIAGNOSIS — M25562 Pain in left knee: Secondary | ICD-10-CM | POA: Diagnosis not present

## 2018-04-25 DIAGNOSIS — M25561 Pain in right knee: Secondary | ICD-10-CM | POA: Diagnosis not present

## 2018-04-25 DIAGNOSIS — M62838 Other muscle spasm: Secondary | ICD-10-CM

## 2018-04-25 DIAGNOSIS — R262 Difficulty in walking, not elsewhere classified: Secondary | ICD-10-CM

## 2018-04-25 NOTE — Therapy (Addendum)
[place], [place], [address] Phone: [phone]   Fax:  [phone]  Physical Therapy Treatment  Patient Details  Name: Jose [name] MRN: [id number] Date of Birth: Nov 13, 1951 Referring Provider: Dr Heather Roberts    Encounter Date: 04/25/2018  PT End of Session - 04/25/18 0847    Visit Number  15    Number of Visits  23    Date for PT Re-Evaluation  05/16/18    Authorization Type  [employer] cross blue shield     PT Start Time  0844    PT Stop Time  0940    PT Time Calculation (min)  56 min    Activity Tolerance  Patient tolerated treatment well    Behavior During Therapy  [facility] for tasks assessed/performed       Past Medical History:  Diagnosis Date  . Arthritis    R shoulder, great toes, hands- has gout & has injections in knees with Dr. Estanislado Pandy   . Cancer ([place])    skin ca-   . GERD (gastroesophageal reflux disease)   . History of hiatal hernia   . SDH (subdural hematoma) ([facility])     Past Surgical History:  Procedure Laterality Date  . BRAIN SURGERY  2016   evacuation of SDH  . CARPAL TUNNEL RELEASE Bilateral   . GREEN LIGHT LASER TURP (TRANSURETHRAL RESECTION OF PROSTATE  04/27/2017  . QUADRICEPS TENDON REPAIR Bilateral 06/15/2017   Procedure: REPAIR QUADRICEP TENDON;  Surgeon: Meredith Pel, MD;  Location: [facility];  Service: Orthopedics;  Laterality: Bilateral;  . SHOULDER SURGERY Right     There were no vitals filed for this visit.  Subjective Assessment - 04/25/18 0845    Subjective  Patient states that he was absent from therapy on Tuesday due to being sick. He is feeling better. Patient has some soreness in the back and knees today. Patient has been discussing surgery on the knee with his doctor.     Limitations  Lifting    How long can you stand comfortably?  <48mins     Diagnostic tests  noting post op    Currently in Pain?  Yes    Pain Score  4     Pain Location  Knee    Pain Orientation  Right;Left    Pain Descriptors / Indicators  Aching    Pain Type  Acute pain    Pain Onset  More than a month ago    Pain Frequency  Intermittent    Aggravating Factors   getting up in the morning     Pain Relieving Factors  moving around, ice     Effect of Pain on Daily Activities  pain with daily activites     Pain Score  4    Pain Location  Back    Pain Orientation  Left    Pain Descriptors / Indicators  Aching    Pain Type  Chronic pain    Pain Onset  More than a month ago    Pain Frequency  Constant    Aggravating Factors   did not answer question clearly answered     Pain Relieving Factors  ice and movement                        [facility] Adult PT Treatment/Exercise - 04/25/18 0001      Lumbar Exercises: Stretches   Passive Hamstring Stretch  3 reps;30 seconds  Single Knee to Chest Stretch  Right;Left;3 reps;20 seconds      Lumbar Exercises: Supine   Bridge Limitations  2x10 bridge w/legs on physioball     Single Leg Bridge  10 reps each, challanging HEP; L knee pain     Other Supine Lumbar Exercises  SLR 2x10; bilateral  2lb weights haulted after 10 reps due to knee pain      Knee/Hip Exercises: Sidelying   Hip ABduction  2 sets;10 reps;Both      Cryotherapy   Number Minutes Cryotherapy  10 Minutes    Cryotherapy Location  Lumbar Spine;Knee    Type of Cryotherapy  Ice pack      Manual Therapy   Manual therapy comments  IASTM to left lumbar spine and upper gluteals    Manual Traction  LAD of right lower extremity 5x30 sec hold              PT Education - 04/25/18 0848    Education provided  Yes    Education Details  Symptom management     Person(s) Educated  Patient    Methods  Explanation;Demonstration;Tactile cues;Verbal cues    Comprehension  Verbalized understanding;Returned demonstration;Need further instruction       PT Short Term Goals - 04/25/18 1046      PT SHORT TERM GOAL #1   Title  Patient will become  independent with HEP for core strength and stretching.    Baseline  Patient independent with HEP    Time  3    Period  Weeks    Status  Achieved    Target Date  03/26/18      PT SHORT TERM GOAL #2   Title  Patient will decrease self reported tenderness in the lumbar paraspinals with palpation.    Baseline  Patient continues to experience tenderness in lumbar paraspinals; pain at 4/10    Time  3    Period  Weeks    Status  On-going    Target Date  03/26/18      PT SHORT TERM GOAL #3   Title  Patient will increase lumbar extensbility by 25%.    Baseline  slight limitation     Time  3    Period  Weeks    Status  On-going    Target Date  03/26/18        PT Long Term Goals - 04/11/18 0929      PT LONG TERM GOAL #1   Title  Patient will demonstrat a 35% limitaion on FOTO.    Baseline  58% limitation     Time  6    Period  Weeks    Status  On-going      PT LONG TERM GOAL #2   Title  Pt will increase hip flexor strength from a 4/5 to a 5/5 in order to stand at work greater than 30 mins with no pain.    Baseline  no increase in strength     Time  6    Period  Weeks    Status  On-going      PT LONG TERM GOAL #3   Title  Patient will increase core strength in odert o stabilize the lower back to perform daily functional activities.     Baseline  occasionally wakes with pain in the night    Time  6    Period  Weeks    Status  On-going      PT LONG TERM  GOAL #4   Title  Patient will stand for 1 hour without increased pain in order to perfrom daily tasks    Baseline  able    Time  8    Period  Weeks    Status  On-going            Plan - 04/25/18 1143    Clinical Impression Statement  Patient reported to PT with 4/10 pain in his L lower back and in bilateral knees. Therapy attempted to continue more challenging hip strengthening exercises that were added last visit but was limited due to patient's fear of increasing knee pain.     Clinical Presentation  Stable     Clinical Decision Making  Low    Rehab Potential  Good    PT Frequency  3x / week    PT Duration  8 weeks    PT Treatment/Interventions  ADLs/Self Care Home Management;Cryotherapy;Electrical Stimulation;Moist Heat;Traction;Ultrasound;Gait Scientist, forensic;Therapeutic activities;Therapeutic exercise;Patient/family education;Manual techniques;Passive range of motion;Dry needling;Taping    PT Next Visit Plan  review new HEP (hip strengthening)    PT Home Exercise Plan  SLR; bridge, clamshell; standing hip abduction and extension; standing march , single leg bridge, clam,quadriped (on elbows/knees)  straight leg and bent leg lifts, minisquats with side steps.    Consulted and Agree with Plan of Care  Patient       Patient will benefit from skilled therapeutic intervention in order to improve the following deficits and impairments:  Abnormal gait, Pain, Increased muscle spasms, Decreased activity tolerance, Decreased endurance, Decreased strength  Visit Diagnosis: Chronic right-sided low back pain without sciatica  Other muscle spasm  Difficulty in walking, not elsewhere classified     Problem List Patient Active Problem List   Diagnosis Date Noted  . Candidiasis   . Abnormality of gait   . Hypoalbuminemia due to protein-calorie malnutrition ([place])   . History of gout   . Sleep disturbance   . Constipation   . Rupture of quadriceps tendon, right, sequela 06/19/2017  . Quadriceps tendon rupture, left, sequela 06/19/2017  . Acute blood loss anemia 06/19/2017  . Fall   . History of subdural hematoma   . Post-operative pain   . Recurrent falls   . Quadriceps tendon rupture 06/15/2017  . Primary osteoarthritis of both knees 02/28/2017  . Primary osteoarthritis of both hands 02/28/2017  . Uricacidemia 02/28/2017  . Pain in joint of left knee 02/07/2017  . Idiopathic chronic gout, unspecified site, without tophus (tophi) 11/29/2016  . HEMATURIA UNSPECIFIED 01/25/2009  .  ABDOMINAL PAIN 01/25/2009  . XERODERMA 03/10/2008  . UNS ADVRS EFF UNS RX MEDICINAL&BIOLOGICAL SBSTNC 03/10/2008  . ADJUSTMENT REACTION WITH PHYSICAL SYMPTOMS 02/14/2008  . MUSCLE SPASM 02/14/2008  . ACUTE PROSTATITIS 12/16/2007  . BREAST LUMP OR MASS, RIGHT 07/12/2007  . H/O: RCT (rotator cuff tear) 01/13/2007  . GOUT 01/08/2007    Carolyne Littles PT DPT  04/25/2018  Cooper Render SPT  04/25/2018, 11:49 AM   During this treatment session, the therapist was present, participating in and directing the treatment.  [place], [place], [address] Phone: [phone]   Fax:  [phone]  Name: Jose [name] MRN: [id number] Date of Birth: May 30, 1951

## 2018-04-29 ENCOUNTER — Telehealth (INDEPENDENT_AMBULATORY_CARE_PROVIDER_SITE_OTHER): Payer: Self-pay | Admitting: Orthopedic Surgery

## 2018-04-29 DIAGNOSIS — F438 Other reactions to severe stress: Secondary | ICD-10-CM | POA: Diagnosis not present

## 2018-04-29 NOTE — Telephone Encounter (Signed)
IC patient  He would like to know if he could have a rx for voltaren gel and also request the hand out that you had discussed with him at his last office visit of exercises for strengthening his knees/legs. Please advise. Thanks.

## 2018-04-29 NOTE — Telephone Encounter (Signed)
Patient requesting a call back to talk with you. No details given # [phone]

## 2018-04-30 ENCOUNTER — Ambulatory Visit: Payer: Medicare Other | Admitting: Physical Therapy

## 2018-04-30 ENCOUNTER — Encounter: Payer: Self-pay | Admitting: Physical Therapy

## 2018-04-30 DIAGNOSIS — M25561 Pain in right knee: Secondary | ICD-10-CM | POA: Diagnosis not present

## 2018-04-30 DIAGNOSIS — M545 Low back pain, unspecified: Secondary | ICD-10-CM

## 2018-04-30 DIAGNOSIS — M25562 Pain in left knee: Secondary | ICD-10-CM

## 2018-04-30 DIAGNOSIS — R262 Difficulty in walking, not elsewhere classified: Secondary | ICD-10-CM | POA: Diagnosis not present

## 2018-04-30 DIAGNOSIS — G8929 Other chronic pain: Secondary | ICD-10-CM | POA: Diagnosis not present

## 2018-04-30 DIAGNOSIS — M62838 Other muscle spasm: Secondary | ICD-10-CM

## 2018-04-30 MED ORDER — DICLOFENAC SODIUM 1 % TD GEL: TRANSDERMAL | 3 refills | Status: DC

## 2018-04-30 NOTE — Telephone Encounter (Signed)
rx sent to pharmacy, order for PT put up front for patient to pick up. Patient advised of all.

## 2018-04-30 NOTE — Telephone Encounter (Signed)
Patient said there was a misunderstanding about rx. He said he needed prescription for physical therapy to treat his knees. He said he has discussed with Dr. Marlou Sa before. His CB # [phone]

## 2018-04-30 NOTE — Therapy (Signed)
[place], [place], [address] Phone: [phone]   Fax:  [phone]  Physical Therapy Treatment  Patient Details  Name: Jose [name] MRN: [id number] Date of Birth: Sep 12, 1951 Referring Provider: Dr Heather Roberts    Encounter Date: 04/30/2018  PT End of Session - 04/30/18 0854    Visit Number  16    Number of Visits  23    Date for PT Re-Evaluation  05/16/18    Authorization Type  [employer] cross blue shield     PT Start Time  0846    PT Stop Time  0937    PT Time Calculation (min)  51 min    Activity Tolerance  Patient tolerated treatment well    Behavior During Therapy  [facility] for tasks assessed/performed       Past Medical History:  Diagnosis Date  . Arthritis    R shoulder, great toes, hands- has gout & has injections in knees with Dr. Estanislado Pandy   . Cancer ([place])    skin ca-   . GERD (gastroesophageal reflux disease)   . History of hiatal hernia   . SDH (subdural hematoma) (HCC)     Past Surgical History:  Procedure Laterality Date  . BRAIN SURGERY  2016   evacuation of SDH  . CARPAL TUNNEL RELEASE Bilateral   . GREEN LIGHT LASER TURP (TRANSURETHRAL RESECTION OF PROSTATE  04/27/2017  . QUADRICEPS TENDON REPAIR Bilateral 06/15/2017   Procedure: REPAIR QUADRICEP TENDON;  Surgeon: Meredith Pel, MD;  Location: [facility];  Service: Orthopedics;  Laterality: Bilateral;  . SHOULDER SURGERY Right     There were no vitals filed for this visit.  Subjective Assessment - 04/30/18 0851    Subjective  Patient reports the pain in his back was in the right over the weekend but right now it is back on the left. The pain level is about 1-2/10. His left knee has been giving him trouble. He continues to go to the gym.      Limitations  Lifting    How long can you stand comfortably?  <90mins     Diagnostic tests  noting post op    Currently in Pain?  Yes    Pain Score  2     Pain Location  Back    Pain  Orientation  Left    Pain Descriptors / Indicators  Aching    Pain Type  Acute pain    Pain Onset  More than a month ago    Pain Frequency  Intermittent    Aggravating Factors   getting up in the morning     Pain Relieving Factors  moving around, ice     Effect of Pain on Daily Activities  pain with daily activity     Multiple Pain Sites  Yes    Pain Score  4    Pain Location  Knee    Pain Orientation  Left    Pain Descriptors / Indicators  Aching    Pain Type  Chronic pain    Pain Onset  More than a month ago    Pain Frequency  Constant    Aggravating Factors   activity     Pain Relieving Factors  ice and movement                        [facility] Adult PT Treatment/Exercise - 04/30/18 0001      Lumbar Exercises:  Stretches   Passive Hamstring Stretch  3 reps;30 seconds    Single Knee to Chest Stretch  Right;Left;3 reps;20 seconds    Piriformis Stretch Limitations  3x20 sec hold     Other Lumbar Stretch Exercise  slantboard stretch 3x30 sec hold      Lumbar Exercises: Standing   Other Standing Lumbar Exercises  lateral band walk with abdominal bracing.       Lumbar Exercises: Supine   Clam  Limitations;20 reps    Clam Limitations  Blue band     Bridge Limitations  2x10 bridge w/legs on physioball     Other Supine Lumbar Exercises  SLR 2x10; bilateral  2lb weights haulted after 10 reps due to knee pain      Knee/Hip Exercises: Standing   Other Standing Knee Exercises  Single leg stance x15 seconds each side. moderate UE assist on the left.       Cryotherapy   Number Minutes Cryotherapy  10 Minutes    Cryotherapy Location  Lumbar Spine;Knee    Type of Cryotherapy  Ice pack      Manual Therapy   Manual therapy comments  IASTM to left lumbar spine and upper gluteals    Manual Traction  LAD of right lower extremity 5x30 sec hold              PT Education - 04/30/18 0854    Education provided  Yes    Education Details  exercise technique     Person(s)  Educated  Patient    Methods  Explanation;Demonstration;Tactile cues;Verbal cues    Comprehension  Returned demonstration;Verbalized understanding;Tactile cues required;Verbal cues required       PT Short Term Goals - 04/25/18 1046      PT SHORT TERM GOAL #1   Title  Patient will become independent with HEP for core strength and stretching.    Baseline  Patient independent with HEP    Time  3    Period  Weeks    Status  Achieved    Target Date  03/26/18      PT SHORT TERM GOAL #2   Title  Patient will decrease self reported tenderness in the lumbar paraspinals with palpation.    Baseline  Patient continues to experience tenderness in lumbar paraspinals; pain at 4/10    Time  3    Period  Weeks    Status  On-going    Target Date  03/26/18      PT SHORT TERM GOAL #3   Title  Patient will increase lumbar extensbility by 25%.    Baseline  slight limitation     Time  3    Period  Weeks    Status  On-going    Target Date  03/26/18        PT Long Term Goals - 04/11/18 0929      PT LONG TERM GOAL #1   Title  Patient will demonstrat a 35% limitaion on FOTO.    Baseline  58% limitation     Time  6    Period  Weeks    Status  On-going      PT LONG TERM GOAL #2   Title  Pt will increase hip flexor strength from a 4/5 to a 5/5 in order to stand at work greater than 30 mins with no pain.    Baseline  no increase in strength     Time  6    Period  Weeks  Status  On-going      PT LONG TERM GOAL #3   Title  Patient will increase core strength in odert o stabilize the lower back to perform daily functional activities.     Baseline  occasionally wakes with pain in the night    Time  6    Period  Weeks    Status  On-going      PT LONG TERM GOAL #4   Title  Patient will stand for 1 hour without increased pain in order to perfrom daily tasks    Baseline  able    Time  8    Period  Weeks    Status  On-going            Plan - 04/30/18 0917    Clinical Impression  Statement  Therapy added single leg stance exercises to his POC> He has a significant difference between the left and right. he had back pain on the left . he reported pain in his lower back with the left at first but pain on both sides in his back by the end. Therapy also added lateral band walk to continue to strengtrhen hip hips. He had a large spasm in his gluteal after treatment. Therapy will continue to progress as tolerated.      Rehab Potential  Good    PT Frequency  3x / week    PT Duration  8 weeks    PT Treatment/Interventions  ADLs/Self Care Home Management;Cryotherapy;Electrical Stimulation;Moist Heat;Traction;Ultrasound;Gait Scientist, forensic;Therapeutic activities;Therapeutic exercise;Patient/family education;Manual techniques;Passive range of motion;Dry needling;Taping    PT Next Visit Plan  review new HEP (hip strengthening)    PT Home Exercise Plan  SLR; bridge, clamshell; standing hip abduction and extension; standing march , single leg bridge, clam,quadriped (on elbows/knees)  straight leg and bent leg lifts, minisquats with side steps.    Consulted and Agree with Plan of Care  Patient       Patient will benefit from skilled therapeutic intervention in order to improve the following deficits and impairments:  Abnormal gait, Pain, Increased muscle spasms, Decreased activity tolerance, Decreased endurance, Decreased strength  Visit Diagnosis: Chronic right-sided low back pain without sciatica  Other muscle spasm  Difficulty in walking, not elsewhere classified  Acute pain of right knee  Acute pain of left knee     Problem List Patient Active Problem List   Diagnosis Date Noted  . Candidiasis   . Abnormality of gait   . Hypoalbuminemia due to protein-calorie malnutrition ([place])   . History of gout   . Sleep disturbance   . Constipation   . Rupture of quadriceps tendon, right, sequela 06/19/2017  . Quadriceps tendon rupture, left, sequela 06/19/2017  . Acute  blood loss anemia 06/19/2017  . Fall   . History of subdural hematoma   . Post-operative pain   . Recurrent falls   . Quadriceps tendon rupture 06/15/2017  . Primary osteoarthritis of both knees 02/28/2017  . Primary osteoarthritis of both hands 02/28/2017  . Uricacidemia 02/28/2017  . Pain in joint of left knee 02/07/2017  . Idiopathic chronic gout, unspecified site, without tophus (tophi) 11/29/2016  . HEMATURIA UNSPECIFIED 01/25/2009  . ABDOMINAL PAIN 01/25/2009  . XERODERMA 03/10/2008  . UNS ADVRS EFF UNS RX MEDICINAL&BIOLOGICAL SBSTNC 03/10/2008  . ADJUSTMENT REACTION WITH PHYSICAL SYMPTOMS 02/14/2008  . MUSCLE SPASM 02/14/2008  . ACUTE PROSTATITIS 12/16/2007  . BREAST LUMP OR MASS, RIGHT 07/12/2007  . H/O: RCT (rotator cuff tear) 01/13/2007  . GOUT  01/08/2007    Carney Living PT DPT  04/30/2018, 10:31 AM  [facility] [address] [place], [place], [address] Phone: [phone]   Fax:  [phone]  Name: DELFORD WINGERT MRN: [id number] Date of Birth: [birth year]

## 2018-04-30 NOTE — Telephone Encounter (Signed)
Okay for prescription for Voltaren gel to be applied to the left knee twice a day.  Also okay for physical therapy for range of motion and strengthening of the left leg.  He can do that about twice a week for 6 weeks thanks

## 2018-04-30 NOTE — Telephone Encounter (Signed)
Waiting to be advised by Dr Marlou Sa from previous message.

## 2018-05-01 DIAGNOSIS — L570 Actinic keratosis: Secondary | ICD-10-CM | POA: Diagnosis not present

## 2018-05-01 DIAGNOSIS — Z08 Encounter for follow-up examination after completed treatment for malignant neoplasm: Secondary | ICD-10-CM | POA: Diagnosis not present

## 2018-05-01 DIAGNOSIS — Z85828 Personal history of other malignant neoplasm of skin: Secondary | ICD-10-CM | POA: Diagnosis not present

## 2018-05-01 DIAGNOSIS — D225 Melanocytic nevi of trunk: Secondary | ICD-10-CM | POA: Diagnosis not present

## 2018-05-01 DIAGNOSIS — D485 Neoplasm of uncertain behavior of skin: Secondary | ICD-10-CM | POA: Diagnosis not present

## 2018-05-02 ENCOUNTER — Ambulatory Visit: Payer: Medicare Other | Admitting: Physical Therapy

## 2018-05-02 ENCOUNTER — Encounter: Payer: Self-pay | Admitting: Physical Therapy

## 2018-05-02 DIAGNOSIS — R262 Difficulty in walking, not elsewhere classified: Secondary | ICD-10-CM | POA: Diagnosis not present

## 2018-05-02 DIAGNOSIS — G8929 Other chronic pain: Secondary | ICD-10-CM | POA: Diagnosis not present

## 2018-05-02 DIAGNOSIS — M62838 Other muscle spasm: Secondary | ICD-10-CM | POA: Diagnosis not present

## 2018-05-02 DIAGNOSIS — M25561 Pain in right knee: Secondary | ICD-10-CM | POA: Diagnosis not present

## 2018-05-02 DIAGNOSIS — M545 Low back pain, unspecified: Secondary | ICD-10-CM

## 2018-05-02 DIAGNOSIS — M25562 Pain in left knee: Secondary | ICD-10-CM | POA: Diagnosis not present

## 2018-05-02 NOTE — Therapy (Signed)
[place], [place], [address] Phone: [phone]   Fax:  [phone]  Physical Therapy Treatment/ Re-eval   Patient Details  Name: Jose [name] MRN: [id number] Date of Birth: 1951-11-20 Referring Provider: Dr Heather Roberts    Encounter Date: 05/02/2018  PT End of Session - 05/02/18 0850    Visit Number  17    Number of Visits  26    Date for PT Re-Evaluation  06/13/18    Authorization Type  Blue cross blue shield     PT Start Time  0845    PT Stop Time  0936    PT Time Calculation (min)  51 min    Activity Tolerance  Patient tolerated treatment well    Behavior During Therapy  [facility] for tasks assessed/performed       Past Medical History:  Diagnosis Date  . Arthritis    R shoulder, great toes, hands- has gout & has injections in knees with Dr. Estanislado Pandy   . Cancer ([place])    skin ca-   . GERD (gastroesophageal reflux disease)   . History of hiatal hernia   . SDH (subdural hematoma) (HCC)     Past Surgical History:  Procedure Laterality Date  . BRAIN SURGERY  2016   evacuation of SDH  . CARPAL TUNNEL RELEASE Bilateral   . GREEN LIGHT LASER TURP (TRANSURETHRAL RESECTION OF PROSTATE  04/27/2017  . QUADRICEPS TENDON REPAIR Bilateral 06/15/2017   Procedure: REPAIR QUADRICEP TENDON;  Surgeon: Meredith Pel, MD;  Location: [place];  Service: Orthopedics;  Laterality: Bilateral;  . SHOULDER SURGERY Right     There were no vitals filed for this visit.  Subjective Assessment - 05/02/18 0848    Subjective  Patient reports the back is about the same. His knee did not get too sore after last visit. He is going to the gym today,     Limitations  Lifting    How long can you stand comfortably?  <29mins     Diagnostic tests  noting post op    Currently in Pain?  Yes    Pain Score  4     Pain Location  Back    Pain Orientation  Mid;Lower    Pain Descriptors / Indicators  Aching    Pain Type   Acute pain    Pain Onset  More than a month ago    Pain Frequency  Intermittent    Aggravating Factors   getting up in the morning     Pain Relieving Factors  moving around and ice     Effect of Pain on Daily Activities  pain with dialy activity     Pain Score  2    Pain Location  Knee    Pain Orientation  Left    Pain Descriptors / Indicators  Aching    Pain Type  Chronic pain    Pain Onset  More than a month ago    Pain Frequency  Constant    Aggravating Factors   actvity     Pain Relieving Factors  ice and movement     Effect of Pain on Daily Activities  `         [facility] PT Assessment - 05/02/18 0001      Strength   Right Hip Flexion  4/5    Right Hip ABduction  5/5    Right Hip ADduction  5/5    Left Hip Flexion  4/5    Right Knee Flexion  4+/5    Right Knee Extension  4+/5    Left Knee Flexion  4+/5    Left Knee Extension  4-/5                   [facility] Adult PT Treatment/Exercise - 05/02/18 0001      Lumbar Exercises: Stretches   Passive Hamstring Stretch  3 reps;30 seconds    Single Knee to Chest Stretch  Right;Left;3 reps;20 seconds    Piriformis Stretch Limitations  3x20 sec hold     Other Lumbar Stretch Exercise  slantboard stretch 3x30 sec hold      Lumbar Exercises: Standing   Other Standing Lumbar Exercises  lateral band walk with abdominal bracing.       Lumbar Exercises: Supine   Clam  Limitations;20 reps    Clam Limitations  Blue band     Bridge Limitations  2x10 bridge w/legs on physioball     Other Supine Lumbar Exercises  SLR 2x10; bilateral  2lb weights haulted after 10 reps due to knee pain      Knee/Hip Exercises: Standing   Forward Step Up  2 sets;10 reps;Step Height: 4"    Other Standing Knee Exercises  Single leg stance x15 seconds each side. moderate UE assist on the left.       Modalities   Modalities  Cryotherapy      Cryotherapy   Number Minutes Cryotherapy  10 Minutes    Cryotherapy Location  Lumbar Spine;Knee    Type  of Cryotherapy  Ice pack      Manual Therapy   Manual therapy comments  IASTM to left lumbar spine and upper gluteals    Manual Traction  LAD of right lower extremity 5x30 sec hold              PT Education - 05/02/18 0849    Education provided  Yes    Education Details  HEP for left lower extrmity strengthening     Person(s) Educated  Patient    Methods  Explanation;Demonstration;Tactile cues;Verbal cues    Comprehension  Verbalized understanding;Returned demonstration;Verbal cues required;Tactile cues required       PT Short Term Goals - 05/02/18 1453      PT SHORT TERM GOAL #1   Title  Patient will become independent with HEP for core strength and stretching.    Baseline  Patient independent with HEP    Time  3    Period  Weeks    Status  Achieved      PT SHORT TERM GOAL #2   Title  Patient will decrease self reported tenderness in the lumbar paraspinals with palpation.    Baseline  Patient continues to experience tenderness in lumbar paraspinals; pain at 4/10    Time  3    Period  Weeks    Status  On-going      PT SHORT TERM GOAL #3   Title  Patient will increase lumbar extensbility by 25%.    Baseline  slight limitation     Time  3    Period  Weeks    Status  On-going      PT SHORT TERM GOAL #4   Title  Patient will demonstrete 4+/5 gross left lower extremity strength     Time  4    Period  Weeks    Status  New      PT SHORT TERM GOAL #5  Title  Patient will increase left single leg stance time to 7 seconds     Time  4    Period  Weeks    Status  New    Target Date  05/30/18        PT Long Term Goals - 05/02/18 1500      PT LONG TERM GOAL #1   Title  Patient will demonstrat a 35% limitaion on FOTO.    Baseline  58% limitation     Time  6    Period  Weeks    Status  On-going      PT LONG TERM GOAL #2   Title  Pt will increase hip flexor strength from a 4/5 to a 5/5 in order to stand at work greater than 30 mins with no pain.    Baseline   no increase in strength     Time  6    Period  Weeks    Status  On-going      PT LONG TERM GOAL #3   Title  Patient will increase core strength in odert o stabilize the lower back to perform daily functional activities.     Baseline  occasionally wakes with pain in the night    Time  6    Period  Weeks    Status  On-going      PT LONG TERM GOAL #4   Title  Patient will stand for 1 hour without increased pain in order to perfrom daily tasks    Baseline  able    Time  8    Period  Weeks    Status  On-going      PT LONG TERM GOAL #5   Title  Patient will go up/down 8 steps with reciprocol gait pattenr with good control on the left to improve community safety.     Time  8    Status  New    Target Date  06/27/18            Plan - 05/02/18 1448    Clinical Impression Statement  Therapy Assessed patients knee today. He has full range of motion of his knee but has significant knee and hip eeakness on the left side. The patient would beneift from skilled therapy to continue to improve single leg stability and improve quad strength prior to repair. The patients back pain continues to fluctuate. He had spasming and pain with palpation this morning.     Clinical Presentation  Stable    Clinical Decision Making  Low    Rehab Potential  Good    PT Frequency  3x / week    PT Duration  8 weeks    PT Treatment/Interventions  ADLs/Self Care Home Management;Cryotherapy;Electrical Stimulation;Moist Heat;Traction;Ultrasound;Gait Scientist, forensic;Therapeutic activities;Therapeutic exercise;Patient/family education;Manual techniques;Passive range of motion;Dry needling;Taping    PT Next Visit Plan  review new HEP (hip strengthening)    PT Home Exercise Plan  SLR; bridge, clamshell; standing hip abduction and extension; standing march , single leg bridge, clam,quadriped (on elbows/knees)  straight leg and bent leg lifts, minisquats with side steps.    Consulted and Agree with Plan of Care   Patient       Patient will benefit from skilled therapeutic intervention in order to improve the following deficits and impairments:  Abnormal gait, Pain, Increased muscle spasms, Decreased activity tolerance, Decreased endurance, Decreased strength  Visit Diagnosis: Chronic right-sided low back pain without sciatica - Plan: PT plan of care cert/re-cert  Other muscle spasm - Plan: PT plan of care cert/re-cert  Difficulty in walking, not elsewhere classified - Plan: PT plan of care cert/re-cert  Acute pain of right knee - Plan: PT plan of care cert/re-cert  Acute pain of left knee - Plan: PT plan of care cert/re-cert     Problem List Patient Active Problem List   Diagnosis Date Noted  . Candidiasis   . Abnormality of gait   . Hypoalbuminemia due to protein-calorie malnutrition ([place])   . History of gout   . Sleep disturbance   . Constipation   . Rupture of quadriceps tendon, right, sequela 06/19/2017  . Quadriceps tendon rupture, left, sequela 06/19/2017  . Acute blood loss anemia 06/19/2017  . Fall   . History of subdural hematoma   . Post-operative pain   . Recurrent falls   . Quadriceps tendon rupture 06/15/2017  . Primary osteoarthritis of both knees 02/28/2017  . Primary osteoarthritis of both hands 02/28/2017  . Uricacidemia 02/28/2017  . Pain in joint of left knee 02/07/2017  . Idiopathic chronic gout, unspecified site, without tophus (tophi) 11/29/2016  . HEMATURIA UNSPECIFIED 01/25/2009  . ABDOMINAL PAIN 01/25/2009  . XERODERMA 03/10/2008  . UNS ADVRS EFF UNS RX MEDICINAL&BIOLOGICAL SBSTNC 03/10/2008  . ADJUSTMENT REACTION WITH PHYSICAL SYMPTOMS 02/14/2008  . MUSCLE SPASM 02/14/2008  . ACUTE PROSTATITIS 12/16/2007  . BREAST LUMP OR MASS, RIGHT 07/12/2007  . H/O: RCT (rotator cuff tear) 01/13/2007  . GOUT 01/08/2007    Carney Living PT DPT  05/02/2018, 3:04 PM  [facility] [address] [place], [place], [address] Phone: [phone]   Fax:  [phone]  Name: Jose [name] MRN: [id number] Date of Birth: 06/14/1951

## 2018-05-06 DIAGNOSIS — F438 Other reactions to severe stress: Secondary | ICD-10-CM | POA: Diagnosis not present

## 2018-05-07 ENCOUNTER — Ambulatory Visit: Payer: Medicare Other | Admitting: Physical Therapy

## 2018-05-07 ENCOUNTER — Encounter: Payer: Self-pay | Admitting: Physical Therapy

## 2018-05-07 DIAGNOSIS — G8929 Other chronic pain: Secondary | ICD-10-CM | POA: Diagnosis not present

## 2018-05-07 DIAGNOSIS — M25561 Pain in right knee: Secondary | ICD-10-CM

## 2018-05-07 DIAGNOSIS — R262 Difficulty in walking, not elsewhere classified: Secondary | ICD-10-CM | POA: Diagnosis not present

## 2018-05-07 DIAGNOSIS — M545 Low back pain, unspecified: Secondary | ICD-10-CM

## 2018-05-07 DIAGNOSIS — M25562 Pain in left knee: Secondary | ICD-10-CM

## 2018-05-07 DIAGNOSIS — M62838 Other muscle spasm: Secondary | ICD-10-CM | POA: Diagnosis not present

## 2018-05-07 DIAGNOSIS — S76111S Strain of right quadriceps muscle, fascia and tendon, sequela: Secondary | ICD-10-CM

## 2018-05-07 DIAGNOSIS — R2689 Other abnormalities of gait and mobility: Secondary | ICD-10-CM

## 2018-05-07 NOTE — Therapy (Signed)
[place], [place], [address] Phone: [phone]   Fax:  [phone]  Physical Therapy Treatment  Patient Details  Name: Jose [name] MRN: [id number] Date of Birth: 06-22-1951 Referring Provider: Dr Heather Roberts    Encounter Date: 05/07/2018  PT End of Session - 05/07/18 0941    Visit Number  18    Number of Visits  26    Date for PT Re-Evaluation  06/13/18    PT Start Time  0848    PT Stop Time  0947    PT Time Calculation (min)  59 min    Activity Tolerance  Patient tolerated treatment well    Behavior During Therapy  [facility] for tasks assessed/performed       Past Medical History:  Diagnosis Date  . Arthritis    R shoulder, great toes, hands- has gout & has injections in knees with Dr. Estanislado Pandy   . Cancer ([place])    skin ca-   . GERD (gastroesophageal reflux disease)   . History of hiatal hernia   . SDH (subdural hematoma) (H[facility])     Past Surgical History:  Procedure Laterality Date  . BRAIN SURGERY  2016   evacuation of SDH  . CARPAL TUNNEL RELEASE Bilateral   . GREEN LIGHT LASER TURP (TRANSURETHRAL RESECTION OF PROSTATE  04/27/2017  . QUADRICEPS TENDON REPAIR Bilateral 06/15/2017   Procedure: REPAIR QUADRICEP TENDON;  Surgeon: Meredith Pel, MD;  Location: [facility];  Service: Orthopedics;  Laterality: Bilateral;  . SHOULDER SURGERY Right     There were no vitals filed for this visit.  Subjective Assessment - 05/07/18 0850    Subjective  i HAVE BEEN DOING THE EXERCISES AND THEY  seem to be helping me get stronger.  I need surgery on the left knee.  They are going to reattach the quad on the right when I give them the date. Wears brace first thing in am. then takes it off.  He will put it on when he goes on steps or if he goes out  due to knee gives away  random  with a dip vs collapse.  (Left)    Currently in Pain?  Yes    Pain Score  3     Pain Location  Back    Pain Orientation   Left;Lower iT MOVES.  IT WAS ON THE RIGHT.     Pain Descriptors / Indicators  Spasm SOMETING HIT ME LIKE A CATTLE PROD.  i MOVE AND  THERE IT IS.  nO CERTAIN IRRITANT    Aggravating Factors   WORSE IN MORNING IN GENERAL    Pain Relieving Factors  stretching and workout at gym    Effect of Pain on Daily Activities  some pain with activity    Pain Score  3    Pain Location  Knee    Pain Orientation  Left    Pain Descriptors / Indicators  Aching    Pain Type  Chronic pain    Pain Frequency  Intermittent    Aggravating Factors   walking up the stAIRS IT IS A STRUGGLE,  IT FEELS LIKE SOMETHING IS MISSING    Pain Relieving Factors  ICE,  PAIN PILLS WHEN HE REALLY NEEDS IT.     Effect of Pain on Daily Activities  -- right knee 3/10  [facility] Adult PT Treatment/Exercise - 05/07/18 0001      Self-Care   Other Self-Care Comments   ADL bed making      Lumbar Exercises: Stretches   Passive Hamstring Stretch  3 reps;30 seconds tight    Single Knee to Chest Stretch  3 reps;10 seconds feels pressure in left hip anterior.      Lumbar Exercises: Supine   Bridge  10 reps    Single Leg Bridge  10 reps felt tightness left low back lifting with single right      Lumbar Exercises: Quadruped   Single Arm Raise  3 seconds      Knee/Hip Exercises: Machines for Strengthening   Cybex Leg Press  80  LBS  X 20      Modalities   Modalities  Cryotherapy      Cryotherapy   Number Minutes Cryotherapy  10 Minutes    Cryotherapy Location  Lumbar Spine;Knee    Type of Cryotherapy  -- cold packs             PT Education - 05/07/18 0940    Education provided  Yes    Education Details  Exercise form,  posture ed    Person(s) Educated  Patient    Methods  Explanation;Verbal cues    Comprehension  Verbalized understanding       PT Short Term Goals - 05/07/18 0904      PT SHORT TERM GOAL #1   Baseline  Patient independent with HEP    Time  3    Period  Weeks     Status  Achieved      PT SHORT TERM GOAL #2   Title  Patient will decrease self reported tenderness in the lumbar paraspinals with palpation.    Baseline  80 % better    Time  3    Period  Weeks    Status  Achieved      PT SHORT TERM GOAL #3   Title  Patient will increase lumbar extensbility by 25%.    Time  3    Period  Weeks    Status  Unable to assess      PT SHORT TERM GOAL #4   Title  Patient will demonstrete 4+/5 gross left lower extremity strength     Time  4    Period  Weeks    Status  Unable to assess      PT SHORT TERM GOAL #5   Time  4    Period  Weeks    Status  Unable to assess        PT Long Term Goals - 05/02/18 1500      PT LONG TERM GOAL #1   Title  Patient will demonstrat a 35% limitaion on FOTO.    Baseline  58% limitation     Time  6    Period  Weeks    Status  On-going      PT LONG TERM GOAL #2   Title  Pt will increase hip flexor strength from a 4/5 to a 5/5 in order to stand at work greater than 30 mins with no pain.    Baseline  no increase in strength     Time  6    Period  Weeks    Status  On-going      PT LONG TERM GOAL #3   Title  Patient will increase core strength in odert o stabilize the lower back to  perform daily functional activities.     Baseline  occasionally wakes with pain in the night    Time  6    Period  Weeks    Status  On-going      PT LONG TERM GOAL #4   Title  Patient will stand for 1 hour without increased pain in order to perfrom daily tasks    Baseline  able    Time  8    Period  Weeks    Status  On-going      PT LONG TERM GOAL #5   Title  Patient will go up/down 8 steps with reciprocol gait pattenr with good control on the left to improve community safety.     Time  8    Status  New    Target Date  06/27/18            Plan - 05/07/18 0942    Clinical Impression Statement  Exercise and stabilization focus.  thghtness noted with some exercises ,  eased with stretching. STG #       Patient  will benefit from skilled therapeutic intervention in order to improve the following deficits and impairments:     Visit Diagnosis: Chronic right-sided low back pain without sciatica  Other muscle spasm  Difficulty in walking, not elsewhere classified  Acute pain of right knee  Acute pain of left knee  Rupture of quadriceps tendon, right, sequela  Other abnormalities of gait and mobility     Problem List Patient Active Problem List   Diagnosis Date Noted  . Candidiasis   . Abnormality of gait   . Hypoalbuminemia due to protein-calorie malnutrition ([facility])   . History of gout   . Sleep disturbance   . Constipation   . Rupture of quadriceps tendon, right, sequela 06/19/2017  . Quadriceps tendon rupture, left, sequela 06/19/2017  . Acute blood loss anemia 06/19/2017  . Fall   . History of subdural hematoma   . Post-operative pain   . Recurrent falls   . Quadriceps tendon rupture 06/15/2017  . Primary osteoarthritis of both knees 02/28/2017  . Primary osteoarthritis of both hands 02/28/2017  . Uricacidemia 02/28/2017  . Pain in joint of left knee 02/07/2017  . Idiopathic chronic gout, unspecified site, without tophus (tophi) 11/29/2016  . HEMATURIA UNSPECIFIED 01/25/2009  . ABDOMINAL PAIN 01/25/2009  . XERODERMA 03/10/2008  . UNS ADVRS EFF UNS RX MEDICINAL&BIOLOGICAL SBSTNC 03/10/2008  . ADJUSTMENT REACTION WITH PHYSICAL SYMPTOMS 02/14/2008  . MUSCLE SPASM 02/14/2008  . ACUTE PROSTATITIS 12/16/2007  . BREAST LUMP OR MASS, RIGHT 07/12/2007  . H/O: RCT (rotator cuff tear) 01/13/2007  . GOUT 01/08/2007    HARRIS,KAREN PTA 05/07/2018, 10:11 AM  [facility] [address] [place], [place], [address] Phone: [phone]   Fax:  [phone]  Name: COURTNEY FENLON MRN: [id number] Date of Birth: 11/05/1951

## 2018-05-09 ENCOUNTER — Ambulatory Visit: Payer: Medicare Other | Admitting: Physical Therapy

## 2018-05-09 ENCOUNTER — Encounter: Payer: Self-pay | Admitting: Physical Therapy

## 2018-05-09 DIAGNOSIS — M25561 Pain in right knee: Secondary | ICD-10-CM | POA: Diagnosis not present

## 2018-05-09 DIAGNOSIS — R262 Difficulty in walking, not elsewhere classified: Secondary | ICD-10-CM

## 2018-05-09 DIAGNOSIS — M25562 Pain in left knee: Secondary | ICD-10-CM | POA: Diagnosis not present

## 2018-05-09 DIAGNOSIS — M545 Low back pain, unspecified: Secondary | ICD-10-CM

## 2018-05-09 DIAGNOSIS — M62838 Other muscle spasm: Secondary | ICD-10-CM

## 2018-05-09 DIAGNOSIS — G8929 Other chronic pain: Secondary | ICD-10-CM

## 2018-05-09 DIAGNOSIS — S76111S Strain of right quadriceps muscle, fascia and tendon, sequela: Secondary | ICD-10-CM

## 2018-05-09 NOTE — Therapy (Signed)
[place], [place], [address] Phone: [phone]   Fax:  [phone]  Physical Therapy Treatment  Patient Details  Name: Jose [name] MRN: [id number] Date of Birth: 1951/01/23 Referring Provider: Dr Heather Roberts    Encounter Date: 05/09/2018  PT End of Session - 05/09/18 1019    Visit Number  19    Number of Visits  26    PT Start Time  0932    PT Stop Time  1027    PT Time Calculation (min)  55 min    Activity Tolerance  Patient tolerated treatment well    Behavior During Therapy  [facility] for tasks assessed/performed       Past Medical History:  Diagnosis Date  . Arthritis    R shoulder, great toes, hands- has gout & has injections in knees with Dr. Estanislado Pandy   . Cancer ([place])    skin ca-   . GERD (gastroesophageal reflux disease)   . History of hiatal hernia   . SDH (subdural hematoma) (HCC)     Past Surgical History:  Procedure Laterality Date  . BRAIN SURGERY  2016   evacuation of SDH  . CARPAL TUNNEL RELEASE Bilateral   . GREEN LIGHT LASER TURP (TRANSURETHRAL RESECTION OF PROSTATE  04/27/2017  . QUADRICEPS TENDON REPAIR Bilateral 06/15/2017   Procedure: REPAIR QUADRICEP TENDON;  Surgeon: Meredith Pel, MD;  Location: [facility];  Service: Orthopedics;  Laterality: Bilateral;  . SHOULDER SURGERY Right     There were no vitals filed for this visit.  Subjective Assessment - 05/09/18 0936    Subjective  I have had 2 good workouts at the gym this week.      Currently in Pain?  Yes    Pain Score  3  3-4/10    Pain Location  Back    Pain Orientation  Left;Lower    Pain Descriptors / Indicators  Tightness I can feel the knot in my back    Aggravating Factors   may be due to workouts.  no pain with work outs    Pain Relieving Factors  as day goes on    Multiple Pain Sites  No                       [facility] Adult PT Treatment/Exercise - 05/09/18 0001      Lumbar  Exercises: Stretches   Hip Flexor Stretch  3 reps;30 seconds min assist,  opposite knee to ckest to protect  both  HEP      Knee/Hip Exercises: Stretches   Sports administrator  3 reps;30 seconds both HEP      Knee/Hip Exercises: Aerobic   Nustep  6 minutes L5  LE/UE      Knee/Hip Exercises: Machines for Strengthening   Cybex Knee Extension  5 LBS  boto 10 second hold and 5 second lowering    Cybex Knee Flexion  25 LBS  10 X2 cued for pause    Cybex Leg Press  60 LBS x    Hip Cybex  2 plates  hip flexion, abduction, extension3 plates.  each      Moist Heat Therapy   Number Minutes Moist Heat  10 Minutes    Moist Heat Location  Lumbar Spine      Cryotherapy   Number Minutes Cryotherapy  10 Minutes    Cryotherapy Location  Knee both    Type of Cryotherapy  -- cold  pack             PT Education - 05/09/18 1006    Education provided  Yes    Education Details  HEP,  standing posture ed.     Person(s) Educated  Patient    Methods  Explanation;Demonstration;Tactile cues;Verbal cues;Handout    Comprehension  Returned demonstration;Verbalized understanding       PT Short Term Goals - 05/07/18 0904      PT SHORT TERM GOAL #1   Baseline  Patient independent with HEP    Time  3    Period  Weeks    Status  Achieved      PT SHORT TERM GOAL #2   Title  Patient will decrease self reported tenderness in the lumbar paraspinals with palpation.    Baseline  80 % better    Time  3    Period  Weeks    Status  Achieved      PT SHORT TERM GOAL #3   Title  Patient will increase lumbar extensbility by 25%.    Time  3    Period  Weeks    Status  Unable to assess      PT SHORT TERM GOAL #4   Title  Patient will demonstrete 4+/5 gross left lower extremity strength     Time  4    Period  Weeks    Status  Unable to assess      PT SHORT TERM GOAL #5   Time  4    Period  Weeks    Status  Unable to assess        PT Long Term Goals - 05/02/18 1500      PT LONG TERM GOAL #1   Title   Patient will demonstrat a 35% limitaion on FOTO.    Baseline  58% limitation     Time  6    Period  Weeks    Status  On-going      PT LONG TERM GOAL #2   Title  Pt will increase hip flexor strength from a 4/5 to a 5/5 in order to stand at work greater than 30 mins with no pain.    Baseline  no increase in strength     Time  6    Period  Weeks    Status  On-going      PT LONG TERM GOAL #3   Title  Patient will increase core strength in odert o stabilize the lower back to perform daily functional activities.     Baseline  occasionally wakes with pain in the night    Time  6    Period  Weeks    Status  On-going      PT LONG TERM GOAL #4   Title  Patient will stand for 1 hour without increased pain in order to perfrom daily tasks    Baseline  able    Time  8    Period  Weeks    Status  On-going      PT LONG TERM GOAL #5   Title  Patient will go up/down 8 steps with reciprocol gait pattenr with good control on the left to improve community safety.     Time  8    Status  New    Target Date  06/27/18            Plan - 05/09/18 1020    Clinical Impression Statement  Progress toward HEP goal.  standing tolerance 15 minutes with 7/10 pain per report.      PT Next Visit Plan  review new HEP (hip strengthening)revies stretches, new    PT Home Exercise Plan  SLR; bridge, clamshell; standing hip abduction and extension; standing march , single leg bridge, clam,quadriped (on elbows/knees)  Hip flexor stretch,  quad stretch.  straight leg and bent leg lifts, minisquats with side steps.    Consulted and Agree with Plan of Care  Patient       Patient will benefit from skilled therapeutic intervention in order to improve the following deficits and impairments:     Visit Diagnosis: Chronic right-sided low back pain without sciatica  Other muscle spasm  Difficulty in walking, not elsewhere classified  Acute pain of right knee  Acute pain of left knee  Rupture of quadriceps  tendon, right, sequela     Problem List Patient Active Problem List   Diagnosis Date Noted  . Candidiasis   . Abnormality of gait   . Hypoalbuminemia due to protein-calorie malnutrition ([place])   . History of gout   . Sleep disturbance   . Constipation   . Rupture of quadriceps tendon, right, sequela 06/19/2017  . Quadriceps tendon rupture, left, sequela 06/19/2017  . Acute blood loss anemia 06/19/2017  . Fall   . History of subdural hematoma   . Post-operative pain   . Recurrent falls   . Quadriceps tendon rupture 06/15/2017  . Primary osteoarthritis of both knees 02/28/2017  . Primary osteoarthritis of both hands 02/28/2017  . Uricacidemia 02/28/2017  . Pain in joint of left knee 02/07/2017  . Idiopathic chronic gout, unspecified site, without tophus (tophi) 11/29/2016  . HEMATURIA UNSPECIFIED 01/25/2009  . ABDOMINAL PAIN 01/25/2009  . XERODERMA 03/10/2008  . UNS ADVRS EFF UNS RX MEDICINAL&BIOLOGICAL SBSTNC 03/10/2008  . ADJUSTMENT REACTION WITH PHYSICAL SYMPTOMS 02/14/2008  . MUSCLE SPASM 02/14/2008  . ACUTE PROSTATITIS 12/16/2007  . BREAST LUMP OR MASS, RIGHT 07/12/2007  . H/O: RCT (rotator cuff tear) 01/13/2007  . GOUT 01/08/2007    HARRIS,KAREN PTA 05/09/2018, 10:21 AM  [facility] [address] [place], [place], [address] Phone: [phone]   Fax:  [phone]  Name: Jose [name] MRN: [id number] Date of Birth: January 28, 1951

## 2018-05-09 NOTE — Patient Instructions (Signed)
Hip Flexor Stretch    Lying on back near edge of bed, bend one leg, foot flat. Hang other leg over edge, relaxed, thigh resting entirely on bed for _30___ seconds. Repeat _3___ times. Do ____ sessions per day. Advanced Exercise: Bend knee back keeping thigh in contact with bed.  3 X 30 seconds.  http://gt2.exer.us/346   Copyright  VHI. All rights reserved.

## 2018-05-09 NOTE — Progress Notes (Signed)
Office Visit Note  Patient: Jose [name]             Date of Birth: 13-Feb-1951           MRN: [id number]             PCP: [name], MD Referring: [name], MD Visit Date: 05/10/2018 Occupation: @GUAROCC @    Subjective:  Knee pain    History of Present Illness: [name] is a 67 y.o. male with history of gout and osteoarthritis.  He continues to take allopurinol 300 mg daily.  He states he has not had a gout flare in over 5 years.  He takes colchicine 0.6 mg on a as needed basis.  He continues to avoid trigger foods.  He is having some discomfort in his bilateral knees due to previous quadricep ruptures.  He continues to see Dr. Marlou Sa on a regular basis in our discussion of having a left quadriceps reconstruction surgery soon.  He reports he continues to go to physical therapy and is a started working out on his own.  He states he is also having pain and discomfort in his right fifth digit.   He denies any joint swelling.    Activities of Daily Living:  Patient reports morning stiffness for 15 minutes.   Patient Reports nocturnal pain.  Difficulty dressing/grooming: Denies Difficulty climbing stairs: Reports Difficulty getting out of chair: Denies Difficulty using hands for taps, buttons, cutlery, and/or writing: Denies   Review of Systems  Constitutional: Positive for fatigue. Negative for night sweats.  HENT: Negative for mouth sores, trouble swallowing, trouble swallowing, mouth dryness and nose dryness.   Eyes: Positive for dryness. Negative for redness.  Respiratory: Negative for cough, hemoptysis, shortness of breath and difficulty breathing.   Cardiovascular: Negative.  Negative for chest pain, palpitations, hypertension, irregular heartbeat and swelling in legs/feet.  Gastrointestinal: Negative for blood in stool, constipation and diarrhea.  Endocrine: Negative for increased urination.  Genitourinary: Negative for difficulty urinating and painful  urination.  Musculoskeletal: Positive for arthralgias, gait problem, joint pain and morning stiffness. Negative for joint swelling, myalgias, muscle weakness, muscle tenderness and myalgias.  Skin: Negative for color change, rash, hair loss, nodules/bumps, skin tightness, ulcers and sensitivity to sunlight.  Allergic/Immunologic: Negative for susceptible to infections.  Neurological: Negative for dizziness, fainting, memory loss, night sweats and weakness.  Hematological: Negative for bruising/bleeding tendency and swollen glands.  Psychiatric/Behavioral: Positive for sleep disturbance. Negative for depressed mood. The patient is not nervous/anxious.     PMFS History:  Patient Active Problem List   Diagnosis Date Noted  . Candidiasis   . Abnormality of gait   . Hypoalbuminemia due to protein-calorie malnutrition ([place])   . History of gout   . Sleep disturbance   . Constipation   . Rupture of quadriceps tendon, right, sequela 06/19/2017  . Quadriceps tendon rupture, left, sequela 06/19/2017  . Acute blood loss anemia 06/19/2017  . Fall   . History of subdural hematoma   . Post-operative pain   . Recurrent falls   . Quadriceps tendon rupture 06/15/2017  . Primary osteoarthritis of both knees 02/28/2017  . Primary osteoarthritis of both hands 02/28/2017  . Uricacidemia 02/28/2017  . Pain in joint of left knee 02/07/2017  . Idiopathic chronic gout, unspecified site, without tophus (tophi) 11/29/2016  . HEMATURIA UNSPECIFIED 01/25/2009  . ABDOMINAL PAIN 01/25/2009  . XERODERMA 03/10/2008  . UNS ADVRS EFF UNS RX MEDICINAL&BIOLOGICAL SBSTNC 03/10/2008  . ADJUSTMENT REACTION WITH  PHYSICAL SYMPTOMS 02/14/2008  . MUSCLE SPASM 02/14/2008  . ACUTE PROSTATITIS 12/16/2007  . BREAST LUMP OR MASS, RIGHT 07/12/2007  . H/O: RCT (rotator cuff tear) 01/13/2007  . GOUT 01/08/2007    Past Medical History:  Diagnosis Date  . Arthritis    R shoulder, great toes, hands- has gout & has injections  in knees with Dr. Estanislado Pandy   . Cancer ([place])    skin ca-   . GERD (gastroesophageal reflux disease)   . History of hiatal hernia   . SDH (subdural hematoma) ([facility])     Family History  Problem Relation Age of Onset  . Parkinson's disease Mother   . Heart disease Father   . Parkinson's disease Brother   . Diabetes Brother    Past Surgical History:  Procedure Laterality Date  . BRAIN SURGERY  2016   evacuation of SDH  . CARPAL TUNNEL RELEASE Bilateral   . GREEN LIGHT LASER TURP (TRANSURETHRAL RESECTION OF PROSTATE  04/27/2017  . KNEE ARTHROPLASTY    . QUADRICEPS TENDON REPAIR Bilateral 06/15/2017   Procedure: REPAIR QUADRICEP TENDON;  Surgeon: Meredith Pel, MD;  Location: [facility];  Service: Orthopedics;  Laterality: Bilateral;  . SHOULDER SURGERY Right    Social History   Social History Narrative  . Not on file     Objective: Vital Signs: BP 135/82 (BP Location: Left Arm, Patient Position: Sitting, Cuff Size: Normal)   Pulse 66   Resp 16   Ht 6' (1.829 m)   Wt 201 lb (91.2 kg)   BMI 27.26 kg/m    Physical Exam  Constitutional: He is oriented to person, place, and time. He appears well-developed and well-nourished.  HENT:  Head: Normocephalic and atraumatic.  Eyes: Pupils are equal, round, and reactive to light. Conjunctivae and EOM are normal.  Neck: Normal range of motion. Neck supple.  Cardiovascular: Normal rate, regular rhythm and normal heart sounds.  Pulmonary/Chest: Effort normal and breath sounds normal.  Abdominal: Soft. Bowel sounds are normal.  Lymphadenopathy:    He has no cervical adenopathy.  Neurological: He is alert and oriented to person, place, and time.  Skin: Skin is warm and dry. Capillary refill takes less than 2 seconds.  Psychiatric: He has a normal mood and affect. His behavior is normal.  Nursing note and vitals reviewed.    Musculoskeletal Exam: C-spine, thoracic, and lumbar spine good ROM.  No midline spinal tenderness.  No SI  joint tenderness.  Dorsal ganglion cyst on right hand.  Shoulder abduction to 90 degrees.  Left elbow extension limited. Right elbow good ROM.  Wrist joints, MCPs, PIPs, and DIPs good ROM with no synovitis.  DIP synovial thickening consistent with osteoarthritis.  Hip joints, knee joints, ankle joints, MTPs, PIPs, and DIPs good ROM with no synovitis.  No warmth or effusion of knee joints.    CDAI Exam: No CDAI exam completed.    Investigation: No additional findings. Uric acid: 03/29/2018 4.8 CBC Latest Ref Rng & Units 03/29/2018 09/04/2017 06/26/2017  WBC 3.8 - 10.8 Thousand/uL 6.5 6.9 7.2  Hemoglobin 13.2 - 17.1 g/dL 13.9 14.1 12.4(L)  Hematocrit 38.5 - 50.0 % 40.3 43.0 38.0(L)  Platelets 140 - 400 Thousand/uL 193 239 286   CMP Latest Ref Rng & Units 03/29/2018 11/12/2017 09/04/2017  Glucose 65 - 99 mg/dL 118(H) 90 76  BUN 7 - 25 mg/dL 19 23 20   Creatinine 0.70 - 1.25 mg/dL 1.01 1.04 1.08  Sodium 135 - 146 mmol/L 144 142 141  Potassium 3.5 - 5.3 mmol/L 4.7 4.2 4.4  Chloride 98 - 110 mmol/L 108 105 106  CO2 20 - 32 mmol/L 31 22 26   Calcium 8.6 - 10.3 mg/dL 9.4 9.2 9.3  Total Protein 6.1 - 8.1 g/dL 5.9(L) - 6.3  Total Bilirubin 0.2 - 1.2 mg/dL 0.4 - 0.2  Alkaline Phos 38 - 126 U/L - - -  AST 10 - 35 U/L 18 - 14  ALT 9 - 46 U/L 24 - 17    Imaging: No results found.  Speciality Comments: No specialty comments available.    Procedures:  No procedures performed Allergies: Patient has no known allergies.   Assessment / Plan:     Visit Diagnoses: Idiopathic chronic gout of multiple sites without tophus -He has not had any recent gout flares.  His last gout flare was about 5 years ago.  He continues to take allopurinol 300 mg daily and colchicine 0.6 mg PRN. His last Uric acid on 03/29/2018 was 4.8.  He was advised to notify us if he develops a flare.  He does not need any refills of his medications at this time.  We will continue to check uric acid, CBC, and CMP every 6 months.    Primary osteoarthritis of both hands: She has DIP synovial thickening consistent with osteoarthritis of bilateral hands.  He has no active synovitis.  We discussed the use of using Voltaren gel.  Joint protection and muscle strengthening were discussed.  Primary osteoarthritis of both knees: No warmth or effusion of bilateral knee joints.  He continues to have discomfort in bilateral knee joints.  He previously had bilateral quadricep tendon ruptures.  He will be having a left quadricep reconstruction performed by Dr. Marlou Sa soon.  Other medical conditions are listed as follows:  Subdural hematoma ([place]) - 2016.  Neuropathy  Polio - child.     Orders: No orders of the defined types were placed in this encounter.  No orders of the defined types were placed in this encounter.    Follow-Up Instructions: Return in about 6 months (around 11/10/2018) for Gout, Osteoarthritis.   Ofilia Neas, PA-C   I examined and evaluated the patient with Hazel Sams PA.  Patient has not had any gout flare.  He had no joint swelling on examination.  The plan of care was discussed as noted above.  Bo Merino, MD  Note - This record has been created using Editor, commissioning.  Chart creation errors have been sought, but may not always  have been located. Such creation errors do not reflect on  the standard of medical care.

## 2018-05-10 ENCOUNTER — Encounter: Payer: Self-pay | Admitting: Rheumatology

## 2018-05-10 ENCOUNTER — Ambulatory Visit (INDEPENDENT_AMBULATORY_CARE_PROVIDER_SITE_OTHER): Payer: Medicare Other | Admitting: Rheumatology

## 2018-05-10 VITALS — BP 135/82 | HR 66 | Resp 16 | Ht 72.0 in | Wt 201.0 lb

## 2018-05-10 DIAGNOSIS — G629 Polyneuropathy, unspecified: Secondary | ICD-10-CM | POA: Diagnosis not present

## 2018-05-10 DIAGNOSIS — M170 Bilateral primary osteoarthritis of knee: Secondary | ICD-10-CM | POA: Diagnosis not present

## 2018-05-10 DIAGNOSIS — M1A09X0 Idiopathic chronic gout, multiple sites, without tophus (tophi): Secondary | ICD-10-CM | POA: Diagnosis not present

## 2018-05-10 DIAGNOSIS — M19041 Primary osteoarthritis, right hand: Secondary | ICD-10-CM

## 2018-05-10 DIAGNOSIS — S065X9A Traumatic subdural hemorrhage with loss of consciousness of unspecified duration, initial encounter: Secondary | ICD-10-CM

## 2018-05-10 DIAGNOSIS — A809 Acute poliomyelitis, unspecified: Secondary | ICD-10-CM | POA: Diagnosis not present

## 2018-05-10 DIAGNOSIS — F438 Other reactions to severe stress: Secondary | ICD-10-CM | POA: Diagnosis not present

## 2018-05-10 DIAGNOSIS — M19042 Primary osteoarthritis, left hand: Secondary | ICD-10-CM

## 2018-05-10 DIAGNOSIS — S065XAA Traumatic subdural hemorrhage with loss of consciousness status unknown, initial encounter: Secondary | ICD-10-CM

## 2018-05-11 ENCOUNTER — Other Ambulatory Visit (INDEPENDENT_AMBULATORY_CARE_PROVIDER_SITE_OTHER): Payer: Self-pay | Admitting: Orthopedic Surgery

## 2018-05-11 NOTE — Telephone Encounter (Signed)
Rx request 

## 2018-05-14 ENCOUNTER — Ambulatory Visit: Payer: Medicare Other | Admitting: Physical Therapy

## 2018-05-14 ENCOUNTER — Encounter: Payer: Self-pay | Admitting: Physical Therapy

## 2018-05-14 DIAGNOSIS — M25562 Pain in left knee: Secondary | ICD-10-CM | POA: Diagnosis not present

## 2018-05-14 DIAGNOSIS — M545 Low back pain, unspecified: Secondary | ICD-10-CM

## 2018-05-14 DIAGNOSIS — M25561 Pain in right knee: Secondary | ICD-10-CM

## 2018-05-14 DIAGNOSIS — M62838 Other muscle spasm: Secondary | ICD-10-CM | POA: Diagnosis not present

## 2018-05-14 DIAGNOSIS — R262 Difficulty in walking, not elsewhere classified: Secondary | ICD-10-CM

## 2018-05-14 DIAGNOSIS — G8929 Other chronic pain: Secondary | ICD-10-CM

## 2018-05-14 NOTE — Telephone Encounter (Signed)
y

## 2018-05-14 NOTE — Therapy (Signed)
[place], [place], [address] Phone: [phone]   Fax:  [phone]  Physical Therapy Treatment  Patient Details  Name: Jose [name] MRN: [id number] Date of Birth: 01-29-1951 Referring Provider: Dr Heather Roberts    Encounter Date: 05/14/2018  PT End of Session - 05/14/18 0950    Visit Number  20    Number of Visits  26    Date for PT Re-Evaluation  06/13/18    Authorization Type  [employer] cross blue shield     PT Start Time  0930    PT Stop Time  1029    PT Time Calculation (min)  59 min    Activity Tolerance  Patient tolerated treatment well    Behavior During Therapy  [facility] for tasks assessed/performed       Past Medical History:  Diagnosis Date  . Arthritis    R shoulder, great toes, hands- has gout & has injections in knees with Dr. Estanislado Pandy   . Cancer ([place])    skin ca-   . GERD (gastroesophageal reflux disease)   . History of hiatal hernia   . SDH (subdural hematoma) (HCC)     Past Surgical History:  Procedure Laterality Date  . BRAIN SURGERY  2016   evacuation of SDH  . CARPAL TUNNEL RELEASE Bilateral   . GREEN LIGHT LASER TURP (TRANSURETHRAL RESECTION OF PROSTATE  04/27/2017  . KNEE ARTHROPLASTY    . QUADRICEPS TENDON REPAIR Bilateral 06/15/2017   Procedure: REPAIR QUADRICEP TENDON;  Surgeon: Meredith Pel, MD;  Location: [place];  Service: Orthopedics;  Laterality: Bilateral;  . SHOULDER SURGERY Right     There were no vitals filed for this visit.  Subjective Assessment - 05/14/18 0942    Subjective  Patient reports the knees have been tolerating exercises well. He is still having pain but it is at its usaula level. His back was sore last night. He is nt haveing any back pain right now.     Limitations  Lifting    How long can you stand comfortably?  <48mins     Diagnostic tests  noting post op    Currently in Pain?  Yes    Pain Score  2     Pain Location  Knee    Pain  Orientation  Right    Pain Descriptors / Indicators  Aching    Pain Type  Chronic pain    Pain Onset  More than a month ago    Aggravating Factors   being up on it     Pain Relieving Factors  rest                        [facility] Adult PT Treatment/Exercise - 05/14/18 0001      Lumbar Exercises: Stretches   Passive Hamstring Stretch  3 reps;30 seconds tight    Single Knee to Chest Stretch  3 reps;10 seconds feels pressure in left hip anterior.    Piriformis Stretch Limitations  3x20 sec hold       Lumbar Exercises: Supine   Clam  Limitations;20 reps    Clam Limitations  Blue band     Bridge  20 reps    Bridge Limitations  with blue band     Other Supine Lumbar Exercises  SLR 2x10; 2lb ith right no weight with left  2lb weights haulted after 10 reps due to knee pain  Knee/Hip Exercises: Aerobic   Nustep  8 L5       Knee/Hip Exercises: Standing   Other Standing Knee Exercises  lateral band walks green around thighs 3 laps at long counter; SLS     Other Standing Knee Exercises  Single leg stance x15 seconds each side. moderate UE assist on the left.       Cryotherapy   Number Minutes Cryotherapy  10 Minutes    Cryotherapy Location  Knee    Type of Cryotherapy  Ice pack      Manual Therapy   Manual therapy comments  IASTM to left lumbar spine and upper gluteals    Manual Traction  LAD of right lower extremity 5x30 sec hold              PT Education - 05/14/18 0949    Education provided  Yes    Education Details  reviewed exercises     Person(s) Educated  Patient    Methods  Explanation;Demonstration;Tactile cues;Verbal cues    Comprehension  Verbalized understanding;Returned demonstration;Verbal cues required;Tactile cues required       PT Short Term Goals - 05/14/18 1551      PT SHORT TERM GOAL #1   Title  Patient will become independent with HEP for core strength and stretching.    Baseline  Patient independent with HEP    Time  3    Period   Weeks    Status  Achieved      PT SHORT TERM GOAL #2   Title  Patient will decrease self reported tenderness in the lumbar paraspinals with palpation.    Baseline  80 % better    Time  3    Period  Weeks    Status  Achieved      PT SHORT TERM GOAL #3   Title  Patient will increase lumbar extensbility by 25%.    Baseline  slight limitation     Time  3    Period  Weeks    Status  On-going      PT SHORT TERM GOAL #4   Title  Patient will demonstrete 4+/5 gross left lower extremity strength     Time  4    Period  Weeks    Status  On-going      PT SHORT TERM GOAL #5   Title  Patient will increase left single leg stance time to 7 seconds     Time  4    Period  Weeks    Status  On-going        PT Long Term Goals - 05/02/18 1500      PT LONG TERM GOAL #1   Title  Patient will demonstrat a 35% limitaion on FOTO.    Baseline  58% limitation     Time  6    Period  Weeks    Status  On-going      PT LONG TERM GOAL #2   Title  Pt will increase hip flexor strength from a 4/5 to a 5/5 in order to stand at work greater than 30 mins with no pain.    Baseline  no increase in strength     Time  6    Period  Weeks    Status  On-going      PT LONG TERM GOAL #3   Title  Patient will increase core strength in odert o stabilize the lower back to perform daily functional activities.  Baseline  occasionally wakes with pain in the night    Time  6    Period  Weeks    Status  On-going      PT LONG TERM GOAL #4   Title  Patient will stand for 1 hour without increased pain in order to perfrom daily tasks    Baseline  able    Time  8    Period  Weeks    Status  On-going      PT LONG TERM GOAL #5   Title  Patient will go up/down 8 steps with reciprocol gait pattenr with good control on the left to improve community safety.     Time  8    Status  New    Target Date  06/27/18            Plan - 05/14/18 1005    Clinical Impression Statement  Good tolerance to exercises  today. Able to advance nbands for lateral band walk. Patient continues to have spasming of the left lumbar spine, He was encouraged to continue stretching .     Clinical Presentation  Stable    Clinical Decision Making  Low    Rehab Potential  Good    PT Frequency  3x / week    PT Duration  8 weeks    PT Treatment/Interventions  ADLs/Self Care Home Management;Cryotherapy;Electrical Stimulation;Moist Heat;Traction;Ultrasound;Gait Scientist, forensic;Therapeutic activities;Therapeutic exercise;Patient/family education;Manual techniques;Passive range of motion;Dry needling;Taping    PT Next Visit Plan  review new HEP (hip strengthening)revies stretches, new    PT Home Exercise Plan  SLR; bridge, clamshell; standing hip abduction and extension; standing march , single leg bridge, clam,quadriped (on elbows/knees)  Hip flexor stretch,  quad stretch.  straight leg and bent leg lifts, minisquats with side steps.       Patient will benefit from skilled therapeutic intervention in order to improve the following deficits and impairments:  Abnormal gait, Pain, Increased muscle spasms, Decreased activity tolerance, Decreased endurance, Decreased strength  Visit Diagnosis: Chronic right-sided low back pain without sciatica  Other muscle spasm  Difficulty in walking, not elsewhere classified  Acute pain of right knee  Acute pain of left knee     Problem List Patient Active Problem List   Diagnosis Date Noted  . Candidiasis   . Abnormality of gait   . Hypoalbuminemia due to protein-calorie malnutrition ([place])   . History of gout   . Sleep disturbance   . Constipation   . Rupture of quadriceps tendon, right, sequela 06/19/2017  . Quadriceps tendon rupture, left, sequela 06/19/2017  . Acute blood loss anemia 06/19/2017  . Fall   . History of subdural hematoma   . Post-operative pain   . Recurrent falls   . Quadriceps tendon rupture 06/15/2017  . Primary osteoarthritis of both knees  02/28/2017  . Primary osteoarthritis of both hands 02/28/2017  . Uricacidemia 02/28/2017  . Pain in joint of left knee 02/07/2017  . Idiopathic chronic gout, unspecified site, without tophus (tophi) 11/29/2016  . HEMATURIA UNSPECIFIED 01/25/2009  . ABDOMINAL PAIN 01/25/2009  . XERODERMA 03/10/2008  . UNS ADVRS EFF UNS RX MEDICINAL&BIOLOGICAL SBSTNC 03/10/2008  . ADJUSTMENT REACTION WITH PHYSICAL SYMPTOMS 02/14/2008  . MUSCLE SPASM 02/14/2008  . ACUTE PROSTATITIS 12/16/2007  . BREAST LUMP OR MASS, RIGHT 07/12/2007  . H/O: RCT (rotator cuff tear) 01/13/2007  . GOUT 01/08/2007    Carney Living PT DPT  05/14/2018, 3:54 PM  [place]  [place], [place], [address] Phone: [phone]   Fax:  [phone]  Name: Jose [name] MRN: [id number] Date of Birth: 1951/07/25

## 2018-05-16 ENCOUNTER — Ambulatory Visit: Payer: Medicare Other | Admitting: Physical Therapy

## 2018-05-16 ENCOUNTER — Encounter: Payer: Self-pay | Admitting: Physical Therapy

## 2018-05-16 DIAGNOSIS — G8929 Other chronic pain: Secondary | ICD-10-CM | POA: Diagnosis not present

## 2018-05-16 DIAGNOSIS — R262 Difficulty in walking, not elsewhere classified: Secondary | ICD-10-CM

## 2018-05-16 DIAGNOSIS — M25561 Pain in right knee: Secondary | ICD-10-CM | POA: Diagnosis not present

## 2018-05-16 DIAGNOSIS — M25562 Pain in left knee: Secondary | ICD-10-CM | POA: Diagnosis not present

## 2018-05-16 DIAGNOSIS — M545 Low back pain, unspecified: Secondary | ICD-10-CM

## 2018-05-16 DIAGNOSIS — M62838 Other muscle spasm: Secondary | ICD-10-CM | POA: Diagnosis not present

## 2018-05-16 NOTE — Therapy (Signed)
[place], [place], [address] Phone: [phone]   Fax:  [phone]  Physical Therapy Treatment  Patient Details  Name: Jose [name] MRN: [id number] Date of Birth: August 02, 1951 Referring Provider: Dr Heather Roberts    Encounter Date: 05/16/2018  PT End of Session - 05/16/18 0851    Visit Number  21    Number of Visits  26    Date for PT Re-Evaluation  06/13/18    Authorization Type  Blue cross blue shield     PT Start Time  0845    PT Stop Time  0936    PT Time Calculation (min)  51 min    Activity Tolerance  Patient tolerated treatment well    Behavior During Therapy  [facility] for tasks assessed/performed       Past Medical History:  Diagnosis Date  . Arthritis    R shoulder, great toes, hands- has gout & has injections in knees with Dr. Estanislado Pandy   . Cancer ([place])    skin ca-   . GERD (gastroesophageal reflux disease)   . History of hiatal hernia   . SDH (subdural hematoma) (HCC)     Past Surgical History:  Procedure Laterality Date  . BRAIN SURGERY  2016   evacuation of SDH  . CARPAL TUNNEL RELEASE Bilateral   . GREEN LIGHT LASER TURP (TRANSURETHRAL RESECTION OF PROSTATE  04/27/2017  . KNEE ARTHROPLASTY    . QUADRICEPS TENDON REPAIR Bilateral 06/15/2017   Procedure: REPAIR QUADRICEP TENDON;  Surgeon: Meredith Pel, MD;  Location: [place];  Service: Orthopedics;  Laterality: Bilateral;  . SHOULDER SURGERY Right     There were no vitals filed for this visit.  Subjective Assessment - 05/16/18 0848    Subjective  Patient reports the pain is doing OK today., his back pain is a 2/10. Each of his knees are a 1/10. He has been going to the gy,. He had a little back soreness when he woke up this moinring .     Currently in Pain?  Yes    Pain Score  1     Pain Location  Knee    Pain Orientation  Right    Pain Descriptors / Indicators  Aching    Pain Type  Chronic pain    Pain Onset  More  than a month ago    Pain Frequency  Intermittent    Aggravating Factors   too much exercise     Pain Relieving Factors  rest     Effect of Pain on Daily Activities  some pain with activity     Multiple Pain Sites  Yes    Pain Score  2    Pain Location  Back    Pain Orientation  Left    Pain Descriptors / Indicators  Aching    Pain Onset  More than a month ago    Pain Frequency  Intermittent    Aggravating Factors   sleeping     Pain Relieving Factors  difficulty perfroming daily activity                        [facility] Adult PT Treatment/Exercise - 05/16/18 0001      Lumbar Exercises: Stretches   Passive Hamstring Stretch  3 reps;30 seconds tight    Piriformis Stretch Limitations  3x20 sec hold       Lumbar Exercises: Standing   Other Standing Lumbar Exercises  heel raise on step 2x10;       Lumbar Exercises: Supine   Clam  Limitations;20 reps    Clam Limitations  Blue band     Bridge  20 reps    Other Supine Lumbar Exercises  SLR 2x10; 2lb ith right no weight with left  2lb weights haulted after 10 reps due to knee pain      Knee/Hip Exercises: Machines for Strengthening   Cybex Knee Flexion  20 lbs 10x2     Cybex Leg Press  60lb x20       Knee/Hip Exercises: Standing   Forward Step Up Limitations  6 inch 2x10     Other Standing Knee Exercises  Single leg stance x20 seconds each side. moderate UE assist on the left.       Manual Therapy   Manual therapy comments  IASTM to left lumbar spine and upper gluteals    Manual Traction  LAD of right lower extremity 5x30 sec hold              PT Education - 05/16/18 0851    Education provided  Yes    Education Details  reviewed technique with stretching and exercises     Person(s) Educated  Patient    Methods  Explanation;Demonstration;Tactile cues;Verbal cues    Comprehension  Verbalized understanding;Returned demonstration;Verbal cues required;Tactile cues required       PT Short Term Goals - 05/14/18  1551      PT SHORT TERM GOAL #1   Title  Patient will become independent with HEP for core strength and stretching.    Baseline  Patient independent with HEP    Time  3    Period  Weeks    Status  Achieved      PT SHORT TERM GOAL #2   Title  Patient will decrease self reported tenderness in the lumbar paraspinals with palpation.    Baseline  80 % better    Time  3    Period  Weeks    Status  Achieved      PT SHORT TERM GOAL #3   Title  Patient will increase lumbar extensbility by 25%.    Baseline  slight limitation     Time  3    Period  Weeks    Status  On-going      PT SHORT TERM GOAL #4   Title  Patient will demonstrete 4+/5 gross left lower extremity strength     Time  4    Period  Weeks    Status  On-going      PT SHORT TERM GOAL #5   Title  Patient will increase left single leg stance time to 7 seconds     Time  4    Period  Weeks    Status  On-going        PT Long Term Goals - 05/02/18 1500      PT LONG TERM GOAL #1   Title  Patient will demonstrat a 35% limitaion on FOTO.    Baseline  58% limitation     Time  6    Period  Weeks    Status  On-going      PT LONG TERM GOAL #2   Title  Pt will increase hip flexor strength from a 4/5 to a 5/5 in order to stand at work greater than 30 mins with no pain.    Baseline  no increase in strength  Time  6    Period  Weeks    Status  On-going      PT LONG TERM GOAL #3   Title  Patient will increase core strength in odert o stabilize the lower back to perform daily functional activities.     Baseline  occasionally wakes with pain in the night    Time  6    Period  Weeks    Status  On-going      PT LONG TERM GOAL #4   Title  Patient will stand for 1 hour without increased pain in order to perfrom daily tasks    Baseline  able    Time  8    Period  Weeks    Status  On-going      PT LONG TERM GOAL #5   Title  Patient will go up/down 8 steps with reciprocol gait pattenr with good control on the left to  improve community safety.     Time  8    Status  New    Target Date  06/27/18            Plan - 05/16/18 1421    Clinical Impression Statement  Patient continues to make good progress. He was able tolerate a 20 second single leg stance today on the left side. He had no increase in pain with treatment. Therapy added heel raises; hamstring Curls and     History and Personal Factors relevant to plan of care:  bilateral quad tears,     Clinical Presentation  Stable    Clinical Decision Making  Low    Rehab Potential  Good    PT Frequency  3x / week    PT Duration  8 weeks    PT Treatment/Interventions  ADLs/Self Care Home Management;Cryotherapy;Electrical Stimulation;Moist Heat;Traction;Ultrasound;Gait Scientist, forensic;Therapeutic activities;Therapeutic exercise;Patient/family education;Manual techniques;Passive range of motion;Dry needling;Taping    PT Next Visit Plan  review new HEP (hip strengthening)revies stretches, new    PT Home Exercise Plan  SLR; bridge, clamshell; standing hip abduction and extension; standing march , single leg bridge, clam,quadriped (on elbows/knees)  Hip flexor stretch,  quad stretch.  straight leg and bent leg lifts, minisquats with side steps.    Consulted and Agree with Plan of Care  Patient       Patient will benefit from skilled therapeutic intervention in order to improve the following deficits and impairments:  Abnormal gait, Pain, Increased muscle spasms, Decreased activity tolerance, Decreased endurance, Decreased strength  Visit Diagnosis: Chronic right-sided low back pain without sciatica  Other muscle spasm  Difficulty in walking, not elsewhere classified  Acute pain of left knee  Acute pain of right knee     Problem List Patient Active Problem List   Diagnosis Date Noted  . Candidiasis   . Abnormality of gait   . Hypoalbuminemia due to protein-calorie malnutrition ([place])   . History of gout   . Sleep disturbance   .  Constipation   . Rupture of quadriceps tendon, right, sequela 06/19/2017  . Quadriceps tendon rupture, left, sequela 06/19/2017  . Acute blood loss anemia 06/19/2017  . Fall   . History of subdural hematoma   . Post-operative pain   . Recurrent falls   . Quadriceps tendon rupture 06/15/2017  . Primary osteoarthritis of both knees 02/28/2017  . Primary osteoarthritis of both hands 02/28/2017  . Uricacidemia 02/28/2017  . Pain in joint of left knee 02/07/2017  . Idiopathic chronic gout, unspecified site, without tophus (  tophi) 11/29/2016  . HEMATURIA UNSPECIFIED 01/25/2009  . ABDOMINAL PAIN 01/25/2009  . XERODERMA 03/10/2008  . UNS ADVRS EFF UNS RX MEDICINAL&BIOLOGICAL SBSTNC 03/10/2008  . ADJUSTMENT REACTION WITH PHYSICAL SYMPTOMS 02/14/2008  . MUSCLE SPASM 02/14/2008  . ACUTE PROSTATITIS 12/16/2007  . BREAST LUMP OR MASS, RIGHT 07/12/2007  . H/O: RCT (rotator cuff tear) 01/13/2007  . GOUT 01/08/2007    Carney Living 05/16/2018, 2:40 PM  [facility] [address] [place], [place], [address] Phone: [phone]   Fax:  [phone]  Name: Jose [name] MRN: [id number] Date of Birth: 1951/05/04

## 2018-05-17 DIAGNOSIS — F438 Other reactions to severe stress: Secondary | ICD-10-CM | POA: Diagnosis not present

## 2018-05-20 DIAGNOSIS — F438 Other reactions to severe stress: Secondary | ICD-10-CM | POA: Diagnosis not present

## 2018-05-30 DIAGNOSIS — F438 Other reactions to severe stress: Secondary | ICD-10-CM | POA: Diagnosis not present

## 2018-06-04 ENCOUNTER — Encounter: Payer: Self-pay | Admitting: Physical Therapy

## 2018-06-04 ENCOUNTER — Ambulatory Visit: Payer: Medicare Other | Attending: Orthopedic Surgery | Admitting: Physical Therapy

## 2018-06-04 DIAGNOSIS — G8929 Other chronic pain: Secondary | ICD-10-CM

## 2018-06-04 DIAGNOSIS — M25562 Pain in left knee: Secondary | ICD-10-CM

## 2018-06-04 DIAGNOSIS — M545 Low back pain, unspecified: Secondary | ICD-10-CM

## 2018-06-04 DIAGNOSIS — M5441 Lumbago with sciatica, right side: Secondary | ICD-10-CM | POA: Insufficient documentation

## 2018-06-04 DIAGNOSIS — M25561 Pain in right knee: Secondary | ICD-10-CM

## 2018-06-04 DIAGNOSIS — R262 Difficulty in walking, not elsewhere classified: Secondary | ICD-10-CM

## 2018-06-04 DIAGNOSIS — M62838 Other muscle spasm: Secondary | ICD-10-CM

## 2018-06-04 DIAGNOSIS — S76111S Strain of right quadriceps muscle, fascia and tendon, sequela: Secondary | ICD-10-CM

## 2018-06-04 DIAGNOSIS — R2689 Other abnormalities of gait and mobility: Secondary | ICD-10-CM

## 2018-06-04 NOTE — Therapy (Signed)
[place], [place], [address] Phone: [phone]   Fax:  [phone]  Physical Therapy Treatment  Patient Details  Name: Jose [name] MRN: [id number] Date of Birth: April 12, 1951 Referring Provider: Dr Heather Roberts    Encounter Date: 06/04/2018  PT End of Session - 06/04/18 1101    Visit Number  22    Number of Visits  26    Date for PT Re-Evaluation  06/13/18    PT Start Time  1020    PT Stop Time  1108    PT Time Calculation (min)  48 min    Activity Tolerance  Patient tolerated treatment well    Behavior During Therapy  [facility] for tasks assessed/performed       Past Medical History:  Diagnosis Date  . Arthritis    R shoulder, great toes, hands- has gout & has injections in knees with Dr. Estanislado Pandy   . Cancer ([place])    skin ca-   . GERD (gastroesophageal reflux disease)   . History of hiatal hernia   . SDH (subdural hematoma) (HCC)     Past Surgical History:  Procedure Laterality Date  . BRAIN SURGERY  2016   evacuation of SDH  . CARPAL TUNNEL RELEASE Bilateral   . GREEN LIGHT LASER TURP (TRANSURETHRAL RESECTION OF PROSTATE  04/27/2017  . KNEE ARTHROPLASTY    . QUADRICEPS TENDON REPAIR Bilateral 06/15/2017   Procedure: REPAIR QUADRICEP TENDON;  Surgeon: Meredith Pel, MD;  Location: [place];  Service: Orthopedics;  Laterality: Bilateral;  . SHOULDER SURGERY Right     There were no vitals filed for this visit.  Subjective Assessment - 06/04/18 1024    Subjective  Pain in low back increased from sitting in car to D/C and Back.   Left knee has not given away in house  for a week and a half.     Currently in Pain?  No/denies Left sometimes painful with walking     Pain Score  -- brace helps left knee     Pain Location  Knee    Pain Orientation  Right    Pain Descriptors / Indicators  -- pressure dstal quads    Aggravating Factors   too much exercise    Pain Relieving Factors  rest     Pain Score  5    Pain Location  Back    Pain Orientation  Left;Lower    Pain Descriptors / Indicators  Sore stiff    Pain Type  Chronic pain    Pain Radiating Towards  1/3 of hamstring    Aggravating Factors   longer sitting ,  pain the next day                       [facility] Adult PT Treatment/Exercise - 06/04/18 0001      Lumbar Exercises: Stretches   Passive Hamstring Stretch  3 reps;20 seconds both    Piriformis Stretch  2 reps;20 seconds each,  knee to opposite shoulder      Lumbar Exercises: Supine   Bent Knee Raise  10 reps    Bridge  20 reps    Other Supine Lumbar Exercises  SLR 2x10; 2lb ith right no weight with left  2lb weights haulted after 10 reps due to knee pain      Knee/Hip Exercises: Aerobic   Nustep  7 minutes L5  LE/UE      Knee/Hip Exercises:  Machines for Strengthening   Cybex Leg Press  60lb x20       Knee/Hip Exercises: Standing   Wall Squat  5 reps cued technique      Cryotherapy   Number Minutes Cryotherapy  10 Minutes    Cryotherapy Location  Knee    Type of Cryotherapy  -- cold pack             PT Education - 06/04/18 1101    Education provided  No       PT Short Term Goals - 05/14/18 1551      PT SHORT TERM GOAL #1   Title  Patient will become independent with HEP for core strength and stretching.    Baseline  Patient independent with HEP    Time  3    Period  Weeks    Status  Achieved      PT SHORT TERM GOAL #2   Title  Patient will decrease self reported tenderness in the lumbar paraspinals with palpation.    Baseline  80 % better    Time  3    Period  Weeks    Status  Achieved      PT SHORT TERM GOAL #3   Title  Patient will increase lumbar extensbility by 25%.    Baseline  slight limitation     Time  3    Period  Weeks    Status  On-going      PT SHORT TERM GOAL #4   Title  Patient will demonstrete 4+/5 gross left lower extremity strength     Time  4    Period  Weeks    Status  On-going      PT  SHORT TERM GOAL #5   Title  Patient will increase left single leg stance time to 7 seconds     Time  4    Period  Weeks    Status  On-going        PT Long Term Goals - 05/02/18 1500      PT LONG TERM GOAL #1   Title  Patient will demonstrat a 35% limitaion on FOTO.    Baseline  58% limitation     Time  6    Period  Weeks    Status  On-going      PT LONG TERM GOAL #2   Title  Pt will increase hip flexor strength from a 4/5 to a 5/5 in order to stand at work greater than 30 mins with no pain.    Baseline  no increase in strength     Time  6    Period  Weeks    Status  On-going      PT LONG TERM GOAL #3   Title  Patient will increase core strength in odert o stabilize the lower back to perform daily functional activities.     Baseline  occasionally wakes with pain in the night    Time  6    Period  Weeks    Status  On-going      PT LONG TERM GOAL #4   Title  Patient will stand for 1 hour without increased pain in order to perfrom daily tasks    Baseline  able    Time  8    Period  Weeks    Status  On-going      PT LONG TERM GOAL #5   Title  Patient will go up/down 8 steps with  reciprocol gait pattenr with good control on the left to improve community safety.     Time  8    Status  New    Target Date  06/27/18            Plan - 06/04/18 1102    Clinical Impression Statement  Strengthening and stretching decreased pain.  No pain at ens of session prior to cold packs.  Patient able to be compliant with HEP,  hamstring  ROM improving ( guarded with stretching HOWEVER)    PT Next Visit Plan  review new HEP (hip strengthening)revies stretches, new    PT Home Exercise Plan  SLR; bridge, clamshell; standing hip abduction and extension; standing march , single leg bridge, clam,quadriped (on elbows/knees)  Hip flexor stretch,  quad stretch.  straight leg and bent leg lifts, minisquats with side steps.    Consulted and Agree with Plan of Care  Patient       Patient will  benefit from skilled therapeutic intervention in order to improve the following deficits and impairments:     Visit Diagnosis: Chronic right-sided low back pain without sciatica  Other muscle spasm  Difficulty in walking, not elsewhere classified  Acute pain of left knee  Acute pain of right knee  Rupture of quadriceps tendon, right, sequela  Other abnormalities of gait and mobility     Problem List Patient Active Problem List   Diagnosis Date Noted  . Candidiasis   . Abnormality of gait   . Hypoalbuminemia due to protein-calorie malnutrition ([place])   . History of gout   . Sleep disturbance   . Constipation   . Rupture of quadriceps tendon, right, sequela 06/19/2017  . Quadriceps tendon rupture, left, sequela 06/19/2017  . Acute blood loss anemia 06/19/2017  . Fall   . History of subdural hematoma   . Post-operative pain   . Recurrent falls   . Quadriceps tendon rupture 06/15/2017  . Primary osteoarthritis of both knees 02/28/2017  . Primary osteoarthritis of both hands 02/28/2017  . Uricacidemia 02/28/2017  . Pain in joint of left knee 02/07/2017  . Idiopathic chronic gout, unspecified site, without tophus (tophi) 11/29/2016  . HEMATURIA UNSPECIFIED 01/25/2009  . ABDOMINAL PAIN 01/25/2009  . XERODERMA 03/10/2008  . UNS ADVRS EFF UNS RX MEDICINAL&BIOLOGICAL SBSTNC 03/10/2008  . ADJUSTMENT REACTION WITH PHYSICAL SYMPTOMS 02/14/2008  . MUSCLE SPASM 02/14/2008  . ACUTE PROSTATITIS 12/16/2007  . BREAST LUMP OR MASS, RIGHT 07/12/2007  . H/O: RCT (rotator cuff tear) 01/13/2007  . GOUT 01/08/2007    Rosalba Totty pta 06/04/2018, 11:04 AM  [facility] [address] [place], [place], [address] Phone: [phone]   Fax:  [phone]  Name: TRAMOND SLINKER MRN: [id number] Date of Birth: Jun 23, 1951

## 2018-06-06 ENCOUNTER — Ambulatory Visit: Payer: Medicare Other | Admitting: Physical Therapy

## 2018-06-06 DIAGNOSIS — F438 Other reactions to severe stress: Secondary | ICD-10-CM | POA: Diagnosis not present

## 2018-06-13 DIAGNOSIS — L570 Actinic keratosis: Secondary | ICD-10-CM | POA: Diagnosis not present

## 2018-06-17 DIAGNOSIS — F438 Other reactions to severe stress: Secondary | ICD-10-CM | POA: Diagnosis not present

## 2018-06-18 ENCOUNTER — Ambulatory Visit: Payer: Medicare Other | Admitting: Physical Therapy

## 2018-06-25 ENCOUNTER — Ambulatory Visit: Payer: Medicare Other | Admitting: Physical Therapy

## 2018-06-26 DIAGNOSIS — F438 Other reactions to severe stress: Secondary | ICD-10-CM | POA: Diagnosis not present

## 2018-06-27 ENCOUNTER — Ambulatory Visit: Payer: Medicare Other | Attending: Orthopedic Surgery | Admitting: Physical Therapy

## 2018-06-27 ENCOUNTER — Ambulatory Visit: Payer: Medicare Other | Admitting: Physical Therapy

## 2018-06-27 ENCOUNTER — Encounter: Payer: Self-pay | Admitting: Physical Therapy

## 2018-06-27 DIAGNOSIS — S76111S Strain of right quadriceps muscle, fascia and tendon, sequela: Secondary | ICD-10-CM

## 2018-06-27 DIAGNOSIS — R262 Difficulty in walking, not elsewhere classified: Secondary | ICD-10-CM

## 2018-06-27 DIAGNOSIS — M25562 Pain in left knee: Secondary | ICD-10-CM | POA: Diagnosis not present

## 2018-06-27 DIAGNOSIS — X58XXXS Exposure to other specified factors, sequela: Secondary | ICD-10-CM | POA: Insufficient documentation

## 2018-06-27 DIAGNOSIS — M545 Low back pain, unspecified: Secondary | ICD-10-CM

## 2018-06-27 DIAGNOSIS — M62838 Other muscle spasm: Secondary | ICD-10-CM | POA: Diagnosis not present

## 2018-06-27 DIAGNOSIS — G8929 Other chronic pain: Secondary | ICD-10-CM | POA: Diagnosis not present

## 2018-06-27 DIAGNOSIS — R2689 Other abnormalities of gait and mobility: Secondary | ICD-10-CM

## 2018-06-27 DIAGNOSIS — M25561 Pain in right knee: Secondary | ICD-10-CM | POA: Diagnosis not present

## 2018-06-27 NOTE — Therapy (Signed)
[place], [place], [address] Phone: [phone]   Fax:  [phone]  Physical Therapy Treatment/ Re-eval  Patient Details  Name: Jose [name] MRN: [id number] Date of Birth: Aug 26, 1951 Referring Provider: Dr Heather Roberts    Encounter Date: 06/27/2018  PT End of Session - 06/27/18 2107    Visit Number  23    Number of Visits  31    Date for PT Re-Evaluation  07/25/18    Authorization Type  [employer] blue [employer]     PT Start Time  1148    PT Stop Time  1240    PT Time Calculation (min)  52 min    Activity Tolerance  Patient tolerated treatment well    Behavior During Therapy  [facility] for tasks assessed/performed       Past Medical History:  Diagnosis Date  . Arthritis    R shoulder, great toes, hands- has gout & has injections in knees with Dr. Estanislado Pandy   . Cancer ([place])    skin ca-   . GERD (gastroesophageal reflux disease)   . History of hiatal hernia   . SDH (subdural hematoma) (HCC)     Past Surgical History:  Procedure Laterality Date  . BRAIN SURGERY  2016   evacuation of SDH  . CARPAL TUNNEL RELEASE Bilateral   . GREEN LIGHT LASER TURP (TRANSURETHRAL RESECTION OF PROSTATE  04/27/2017  . KNEE ARTHROPLASTY    . QUADRICEPS TENDON REPAIR Bilateral 06/15/2017   Procedure: REPAIR QUADRICEP TENDON;  Surgeon: Meredith Pel, MD;  Location: [place];  Service: Orthopedics;  Laterality: Bilateral;  . SHOULDER SURGERY Right     There were no vitals filed for this visit.  Subjective Assessment - 06/27/18 1152    Subjective  Patient has had an increase in low back pain over the past week. He has not been seen by PT in nearly a month. He reports he wasdoing well until he had a flare up in lower back pain. His left leg is now buckling as well.     Limitations  Lifting    How long can you stand comfortably?  <74mins     Diagnostic tests  noting post op    Currently in Pain?  Yes    Pain  Score  6     Pain Location  Back    Pain Orientation  Right;Left    Pain Descriptors / Indicators  Aching    Pain Type  Chronic pain    Pain Onset  More than a month ago    Pain Frequency  Intermittent    Aggravating Factors   stiff in the mornings     Pain Relieving Factors  movement     Multiple Pain Sites  No    Pain Score  7    Pain Orientation  Left    Pain Descriptors / Indicators  Aching    Pain Type  Chronic pain    Pain Onset  More than a month ago    Pain Frequency  Intermittent    Aggravating Factors   longer sitting, pain the next day ; pain at night     Pain Relieving Factors  difficulty perfroming daly activity          [facility] PT Assessment - 06/27/18 0001      AROM   Lumbar Flexion  50% limited     Lumbar Extension  50 % limited     Lumbar -  Right Side Bend  increased pain on the left       Strength   Right Hip Flexion  4+/5    Right Knee Flexion  4+/5    Right Knee Extension  4+/5    Left Knee Flexion  4/5    Left Knee Extension  3+/5      faircore contraction              [facility] Adult PT Treatment/Exercise - 06/27/18 0001      Manual Therapy   Manual Therapy  Joint mobilization    Manual therapy comments  IASTM to left lumbar spine and upper gluteals and right     Joint Mobilization  PA mobilization     Manual Traction  LAD of bilateral lower extremity 5x30 sec hold        Trigger Point Dry Needling - 06/27/18 2119    Consent Given?  Yes    Longissimus Response  Twitch response elicited    Gluteus Minimus Response  Twitch response elicited glut medius            PT Education - 06/27/18 1154    Education provided  Yes    Education Details  talked with patient about scaling back gym exercises at this time     Person(s) Educated  Patient    Methods  Explanation;Demonstration;Tactile cues;Verbal cues    Comprehension  Verbalized understanding;Returned demonstration;Verbal cues required;Tactile cues required       PT Short Term  Goals - 05/14/18 1551      PT SHORT TERM GOAL #1   Title  Patient will become independent with HEP for core strength and stretching.    Baseline  Patient independent with HEP    Time  3    Period  Weeks    Status  Achieved      PT SHORT TERM GOAL #2   Title  Patient will decrease self reported tenderness in the lumbar paraspinals with palpation.    Baseline  80 % better    Time  3    Period  Weeks    Status  Achieved      PT SHORT TERM GOAL #3   Title  Patient will increase lumbar extensbility by 25%.    Baseline  slight limitation     Time  3    Period  Weeks    Status  On-going      PT SHORT TERM GOAL #4   Title  Patient will demonstrete 4+/5 gross left lower extremity strength     Time  4    Period  Weeks    Status  On-going      PT SHORT TERM GOAL #5   Title  Patient will increase left single leg stance time to 7 seconds     Time  4    Period  Weeks    Status  On-going        PT Long Term Goals - 05/02/18 1500      PT LONG TERM GOAL #1   Title  Patient will demonstrat a 35% limitaion on FOTO.    Baseline  58% limitation     Time  6    Period  Weeks    Status  On-going      PT LONG TERM GOAL #2   Title  Pt will increase hip flexor strength from a 4/5 to a 5/5 in order to stand at work greater than 30 mins with no pain.  Baseline  no increase in strength     Time  6    Period  Weeks    Status  On-going      PT LONG TERM GOAL #3   Title  Patient will increase core strength in odert o stabilize the lower back to perform daily functional activities.     Baseline  occasionally wakes with pain in the night    Time  6    Period  Weeks    Status  On-going      PT LONG TERM GOAL #4   Title  Patient will stand for 1 hour without increased pain in order to perfrom daily tasks    Baseline  able    Time  8    Period  Weeks    Status  On-going      PT LONG TERM GOAL #5   Title  Patient will go up/down 8 steps with reciprocol gait pattenr with good control  on the left to improve community safety.     Time  8    Status  New    Target Date  06/27/18            Plan - 06/27/18 2108    Clinical Impression Statement  Patient presentstoday with significant spasming in his bac. Has increased motion limitations with lumbar flexion and extension as compared to the last time he was evaluatied. Therapy reviewed his gym program and advised him to continue working on exercise bike and walking but back off his other exercises. At this time he would benefit from further therapy to see if we can stabilize his back pain and get him back on his gym program to maintain his strength until his quad is reparied.     Clinical Presentation  Evolving    Clinical Decision Making  Moderate    PT Frequency  2x / week    PT Duration  4 weeks    PT Treatment/Interventions  ADLs/Self Care Home Management;Cryotherapy;Electrical Stimulation;Moist Heat;Traction;Ultrasound;Gait Scientist, forensic;Therapeutic activities;Therapeutic exercise;Patient/family education;Manual techniques;Passive range of motion;Dry needling;Taping    PT Next Visit Plan  continue with manual therapy; continue with dry needling; continue with stretching and strengthening as able. Progress back to HEP when able.     PT Home Exercise Plan  SLR; bridge, clamshell; standing hip abduction and extension; standing march , single leg bridge, clam,quadriped (on elbows/knees)  Hip flexor stretch,  quad stretch.  straight leg and bent leg lifts, minisquats with side steps.    Consulted and Agree with Plan of Care  Patient       Patient will benefit from skilled therapeutic intervention in order to improve the following deficits and impairments:  Abnormal gait, Pain, Increased muscle spasms, Decreased activity tolerance, Decreased endurance, Decreased strength  Visit Diagnosis: Chronic right-sided low back pain without sciatica  Other muscle spasm  Difficulty in walking, not elsewhere  classified  Acute pain of left knee  Acute pain of right knee  Rupture of quadriceps tendon, right, sequela  Other abnormalities of gait and mobility     Problem List Patient Active Problem List   Diagnosis Date Noted  . Candidiasis   . Abnormality of gait   . Hypoalbuminemia due to protein-calorie malnutrition ([place])   . History of gout   . Sleep disturbance   . Constipation   . Rupture of quadriceps tendon, right, sequela 06/19/2017  . Quadriceps tendon rupture, left, sequela 06/19/2017  . Acute blood loss anemia 06/19/2017  . Fall   .  History of subdural hematoma   . Post-operative pain   . Recurrent falls   . Quadriceps tendon rupture 06/15/2017  . Primary osteoarthritis of both knees 02/28/2017  . Primary osteoarthritis of both hands 02/28/2017  . Uricacidemia 02/28/2017  . Pain in joint of left knee 02/07/2017  . Idiopathic chronic gout, unspecified site, without tophus (tophi) 11/29/2016  . HEMATURIA UNSPECIFIED 01/25/2009  . ABDOMINAL PAIN 01/25/2009  . XERODERMA 03/10/2008  . UNS ADVRS EFF UNS RX MEDICINAL&BIOLOGICAL SBSTNC 03/10/2008  . ADJUSTMENT REACTION WITH PHYSICAL SYMPTOMS 02/14/2008  . MUSCLE SPASM 02/14/2008  . ACUTE PROSTATITIS 12/16/2007  . BREAST LUMP OR MASS, RIGHT 07/12/2007  . H/O: RCT (rotator cuff tear) 01/13/2007  . GOUT 01/08/2007    Jose [name] 06/27/2018, 9:23 PM  [facility] [address] [place], [place], [address] Phone: [phone]   Fax:  [phone]  Name: Jose [name] MRN: [id number] Date of Birth: November 12, 1951

## 2018-06-28 ENCOUNTER — Encounter

## 2018-06-30 ENCOUNTER — Other Ambulatory Visit (INDEPENDENT_AMBULATORY_CARE_PROVIDER_SITE_OTHER): Payer: Self-pay | Admitting: Orthopedic Surgery

## 2018-07-01 ENCOUNTER — Telehealth (INDEPENDENT_AMBULATORY_CARE_PROVIDER_SITE_OTHER): Payer: Self-pay

## 2018-07-01 ENCOUNTER — Telehealth (INDEPENDENT_AMBULATORY_CARE_PROVIDER_SITE_OTHER): Payer: Self-pay | Admitting: Orthopedic Surgery

## 2018-07-01 NOTE — Telephone Encounter (Signed)
Patient called stated he was tied up when you called him.  Patient requested for a all back.  [phone]

## 2018-07-01 NOTE — Telephone Encounter (Signed)
Patient had been transferred to my voicemail earlier today and LM asking for return call stating he had several questions and a problem.  I tried calling patient back to discuss. No answer. LM for him advising that we were returning his call. Should patient call back just need to know what his concerns are so they can be sent to Dr Marlou Sa .

## 2018-07-01 NOTE — Telephone Encounter (Signed)
IC s/w patient all he needed was an appt to see Dr Marlou Sa appt scheduled.

## 2018-07-02 ENCOUNTER — Encounter: Payer: Self-pay | Admitting: Physical Therapy

## 2018-07-02 ENCOUNTER — Ambulatory Visit: Payer: Medicare Other | Admitting: Physical Therapy

## 2018-07-02 DIAGNOSIS — M62838 Other muscle spasm: Secondary | ICD-10-CM

## 2018-07-02 DIAGNOSIS — R262 Difficulty in walking, not elsewhere classified: Secondary | ICD-10-CM

## 2018-07-02 DIAGNOSIS — M545 Low back pain, unspecified: Secondary | ICD-10-CM

## 2018-07-02 DIAGNOSIS — G8929 Other chronic pain: Secondary | ICD-10-CM

## 2018-07-02 DIAGNOSIS — M25562 Pain in left knee: Secondary | ICD-10-CM | POA: Diagnosis not present

## 2018-07-02 DIAGNOSIS — M25561 Pain in right knee: Secondary | ICD-10-CM | POA: Diagnosis not present

## 2018-07-02 NOTE — Therapy (Signed)
[place], [place], [address] Phone: [phone]   Fax:  [phone]  Physical Therapy Treatment  Patient Details  Name: Jose [name] MRN: [id number] Date of Birth: [birth year] Referring Provider: Dr Heather Roberts    Encounter Date: 07/02/2018  PT End of Session - 07/02/18 0958    Visit Number  24    Number of Visits  31    Date for PT Re-Evaluation  07/25/18    PT Start Time  0847    PT Stop Time  0945    PT Time Calculation (min)  58 min    Activity Tolerance  Patient tolerated treatment well    Behavior During Therapy  [facility] for tasks assessed/performed       Past Medical History:  Diagnosis Date  . Arthritis    R shoulder, great toes, hands- has gout & has injections in knees with Dr. Estanislado Pandy   . Cancer ([place])    skin ca-   . GERD (gastroesophageal reflux disease)   . History of hiatal hernia   . SDH (subdural hematoma) (HCC)     Past Surgical History:  Procedure Laterality Date  . BRAIN SURGERY  2016   evacuation of SDH  . CARPAL TUNNEL RELEASE Bilateral   . GREEN LIGHT LASER TURP (TRANSURETHRAL RESECTION OF PROSTATE  04/27/2017  . KNEE ARTHROPLASTY    . QUADRICEPS TENDON REPAIR Bilateral 06/15/2017   Procedure: REPAIR QUADRICEP TENDON;  Surgeon: Meredith Pel, MD;  Location: [place];  Service: Orthopedics;  Laterality: Bilateral;  . SHOULDER SURGERY Right     There were no vitals filed for this visit.  Subjective Assessment - 07/02/18 0850    Subjective  It took me 30 seconds to stand up this morning.  6/10 .  I moved around and it is 2/10.  Left knee dip is getting more pronunced.     Pain Score  2     Pain Location  Back    Pain Orientation  Left    Pain Descriptors / Indicators  Aching stiff,  intense    Pain Type  Chronic pain    Pain Frequency  Intermittent    Aggravating Factors   feels funny    Pain Location  Knee    Pain Orientation  Left;Right    Pain  Descriptors / Indicators  -- feels funny,  right knee pressure distal quad,  constant    Pain Type  Chronic pain    Aggravating Factors   longer sitting    Pain Relieving Factors  keeps moving                       [facility] Adult PT Treatment/Exercise - 07/02/18 0001      Lumbar Exercises: Stretches   Double Knee to Chest Stretch  2 reps;20 seconds    Lower Trunk Rotation  -- 10 reps      Lumbar Exercises: Supine   Pelvic Tilt  5 reps    Bridge  20 reps    Other Supine Lumbar Exercises  scissors level 1 X 10  and level 2 X 5,  good core control      Lumbar Exercises: Prone   Other Prone Lumbar Exercises  Multifitus:  over 2 pillows,  press 10 X, press and hold with knee flexion 10 x single (mod instruction0 and press with hip extension.  HEP      Knee/Hip Exercises: Stretches  Other Knee/Hip Stretches  prone hip Ir/ER stretch 20  seconds X 3      Cryotherapy   Number Minutes Cryotherapy  10 Minutes    Cryotherapy Location  Lumbar Spine    Type of Cryotherapy  -- cold pack             PT Education - 07/02/18 0958    Education provided  Yes    Education Details  HEP    Person(s) Educated  Patient    Methods  Explanation;Tactile cues;Verbal cues;Handout    Comprehension  Verbalized understanding;Returned demonstration       PT Short Term Goals - 05/14/18 1551      PT SHORT TERM GOAL #1   Title  Patient will become independent with HEP for core strength and stretching.    Baseline  Patient independent with HEP    Time  3    Period  Weeks    Status  Achieved      PT SHORT TERM GOAL #2   Title  Patient will decrease self reported tenderness in the lumbar paraspinals with palpation.    Baseline  80 % better    Time  3    Period  Weeks    Status  Achieved      PT SHORT TERM GOAL #3   Title  Patient will increase lumbar extensbility by 25%.    Baseline  slight limitation     Time  3    Period  Weeks    Status  On-going      PT SHORT TERM GOAL  #4   Title  Patient will demonstrete 4+/5 gross left lower extremity strength     Time  4    Period  Weeks    Status  On-going      PT SHORT TERM GOAL #5   Title  Patient will increase left single leg stance time to 7 seconds     Time  4    Period  Weeks    Status  On-going        PT Long Term Goals - 05/02/18 1500      PT LONG TERM GOAL #1   Title  Patient will demonstrat a 35% limitaion on FOTO.    Baseline  58% limitation     Time  6    Period  Weeks    Status  On-going      PT LONG TERM GOAL #2   Title  Pt will increase hip flexor strength from a 4/5 to a 5/5 in order to stand at work greater than 30 mins with no pain.    Baseline  no increase in strength     Time  6    Period  Weeks    Status  On-going      PT LONG TERM GOAL #3   Title  Patient will increase core strength in odert o stabilize the lower back to perform daily functional activities.     Baseline  occasionally wakes with pain in the night    Time  6    Period  Weeks    Status  On-going      PT LONG TERM GOAL #4   Title  Patient will stand for 1 hour without increased pain in order to perfrom daily tasks    Baseline  able    Time  8    Period  Weeks    Status  On-going      PT  LONG TERM GOAL #5   Title  Patient will go up/down 8 steps with reciprocol gait pattenr with good control on the left to improve community safety.     Time  8    Status  New    Target Date  06/27/18            Plan - 07/02/18 1000    Clinical Impression Statement  Lumbar focus today with  increased soreness from new exercises (prone multifitus).  Soreness addressed with double knees to chest and cold pack.    PT Next Visit Plan  continue with manual therapy; continue with dry needling; continue with stretching and strengthening as able. Progress back to HEP when able.     PT Home Exercise Plan  SLR; bridge, clamshell; standing hip abduction and extension; standing march , single leg bridge, clam,quadriped (on  elbows/knees)  Hip flexor stretch,  quad stretch.  straight leg and bent leg lifts, minisquats with side steps.Multifitus press, press with knee flexion,  press with hip extension.    Consulted and Agree with Plan of Care  Patient       Patient will benefit from skilled therapeutic intervention in order to improve the following deficits and impairments:     Visit Diagnosis: Chronic right-sided low back pain without sciatica  Other muscle spasm  Difficulty in walking, not elsewhere classified     Problem List Patient Active Problem List   Diagnosis Date Noted  . Candidiasis   . Abnormality of gait   . Hypoalbuminemia due to protein-calorie malnutrition ([place])   . History of gout   . Sleep disturbance   . Constipation   . Rupture of quadriceps tendon, right, sequela 06/19/2017  . Quadriceps tendon rupture, left, sequela 06/19/2017  . Acute blood loss anemia 06/19/2017  . Fall   . History of subdural hematoma   . Post-operative pain   . Recurrent falls   . Quadriceps tendon rupture 06/15/2017  . Primary osteoarthritis of both knees 02/28/2017  . Primary osteoarthritis of both hands 02/28/2017  . Uricacidemia 02/28/2017  . Pain in joint of left knee 02/07/2017  . Idiopathic chronic gout, unspecified site, without tophus (tophi) 11/29/2016  . HEMATURIA UNSPECIFIED 01/25/2009  . ABDOMINAL PAIN 01/25/2009  . XERODERMA 03/10/2008  . UNS ADVRS EFF UNS RX MEDICINAL&BIOLOGICAL SBSTNC 03/10/2008  . ADJUSTMENT REACTION WITH PHYSICAL SYMPTOMS 02/14/2008  . MUSCLE SPASM 02/14/2008  . ACUTE PROSTATITIS 12/16/2007  . BREAST LUMP OR MASS, RIGHT 07/12/2007  . H/O: RCT (rotator cuff tear) 01/13/2007  . GOUT 01/08/2007    HARRIS,KAREN PTA 07/02/2018, 10:04 AM  [facility] [address] [place], [place], [address] Phone: [phone]   Fax:  [phone]  Name: RAKEEM COLLEY MRN: [id number] Date of Birth: [birth year]

## 2018-07-02 NOTE — Patient Instructions (Addendum)
Issued  multifitus exercise from Exercise drawer First 3 exercises issued Daily 1 second hold 10 x each

## 2018-07-04 ENCOUNTER — Ambulatory Visit: Payer: Medicare Other | Admitting: Physical Therapy

## 2018-07-04 DIAGNOSIS — M62838 Other muscle spasm: Secondary | ICD-10-CM | POA: Diagnosis not present

## 2018-07-04 DIAGNOSIS — M545 Low back pain, unspecified: Secondary | ICD-10-CM

## 2018-07-04 DIAGNOSIS — R262 Difficulty in walking, not elsewhere classified: Secondary | ICD-10-CM | POA: Diagnosis not present

## 2018-07-04 DIAGNOSIS — G8929 Other chronic pain: Secondary | ICD-10-CM

## 2018-07-04 DIAGNOSIS — M25561 Pain in right knee: Secondary | ICD-10-CM | POA: Diagnosis not present

## 2018-07-04 DIAGNOSIS — M25562 Pain in left knee: Secondary | ICD-10-CM | POA: Diagnosis not present

## 2018-07-04 NOTE — Therapy (Signed)
[place], [place], [address] Phone: [phone]   Fax:  [phone]  Physical Therapy Treatment  Patient Details  Name: Jose [name] MRN: [id number] Date of Birth: 1951-04-27 Referring Provider: Dr Heather Roberts    Encounter Date: 07/04/2018  PT End of Session - 07/04/18 1009    Visit Number  25    Number of Visits  31    Date for PT Re-Evaluation  07/25/18    Authorization Type  [employer]     PT Start Time  0930    PT Stop Time  1022    PT Time Calculation (min)  52 min    Activity Tolerance  Patient tolerated treatment well    Behavior During Therapy  [facility] for tasks assessed/performed       Past Medical History:  Diagnosis Date  . Arthritis    R shoulder, great toes, hands- has gout & has injections in knees with Dr. Estanislado Pandy   . Cancer ([place])    skin ca-   . GERD (gastroesophageal reflux disease)   . History of hiatal hernia   . SDH (subdural hematoma) (HCC)     Past Surgical History:  Procedure Laterality Date  . BRAIN SURGERY  2016   evacuation of SDH  . CARPAL TUNNEL RELEASE Bilateral   . GREEN LIGHT LASER TURP (TRANSURETHRAL RESECTION OF PROSTATE  04/27/2017  . KNEE ARTHROPLASTY    . QUADRICEPS TENDON REPAIR Bilateral 06/15/2017   Procedure: REPAIR QUADRICEP TENDON;  Surgeon: Meredith Pel, MD;  Location: [place];  Service: Orthopedics;  Laterality: Bilateral;  . SHOULDER SURGERY Right     There were no vitals filed for this visit.  Subjective Assessment - 07/04/18 1947    Subjective  Patient [place] the [place] this weekend. He did a lot of walking and did a lot of driving, He reported his back held up pretty well. He was able to use some stretches given to him.     Limitations  Lifting    How long can you stand comfortably?  <25mins     Diagnostic tests  noting post op    Currently in Pain?  Yes    Pain Score  3     Pain Location  Back    Pain Orientation  Left    Pain Descriptors / Indicators  Aching    Pain Type  Chronic pain    Pain Onset  More than a month ago    Pain Frequency  Intermittent    Aggravating Factors   feels funny     Pain Relieving Factors  movement     Effect of Pain on Daily Activities  some pain with activity                        [facility] Adult PT Treatment/Exercise - 07/04/18 0001      Lumbar Exercises: Stretches   Double Knee to Chest Stretch  2 reps;20 seconds    Piriformis Stretch  2 reps;20 seconds each,  knee to opposite shoulder      Lumbar Exercises: Supine   Bridge  20 reps      Cryotherapy   Number Minutes Cryotherapy  10 Minutes    Cryotherapy Location  Lumbar Spine      Manual Therapy   Manual Therapy  Joint mobilization    Manual therapy comments  IASTM to left lumbar spine and upper  gluteals and right     Joint Mobilization  PA mobilization with emphais on L2, L3 and L4     Soft tissue mobilization  '    Manual Traction  right LAD        Trigger Point Dry Needling - 07/04/18 1956    Consent Given?  Yes    Longissimus Response  Twitch response elicited [id number] right            PT Education - 07/04/18 1951    Education provided  Yes    Education Details  reviewed the benefits and risks of TPDN     Person(s) Educated  Patient    Methods  Explanation;Demonstration;Tactile cues;Verbal cues    Comprehension  Verbalized understanding;Returned demonstration;Verbal cues required       PT Short Term Goals - 05/14/18 1551      PT SHORT TERM GOAL #1   Title  Patient will become independent with HEP for core strength and stretching.    Baseline  Patient independent with HEP    Time  3    Period  Weeks    Status  Achieved      PT SHORT TERM GOAL #2   Title  Patient will decrease self reported tenderness in the lumbar paraspinals with palpation.    Baseline  80 % better    Time  3    Period  Weeks    Status  Achieved      PT SHORT TERM GOAL #3    Title  Patient will increase lumbar extensbility by 25%.    Baseline  slight limitation     Time  3    Period  Weeks    Status  On-going      PT SHORT TERM GOAL #4   Title  Patient will demonstrete 4+/5 gross left lower extremity strength     Time  4    Period  Weeks    Status  On-going      PT SHORT TERM GOAL #5   Title  Patient will increase left single leg stance time to 7 seconds     Time  4    Period  Weeks    Status  On-going        PT Long Term Goals - 05/02/18 1500      PT LONG TERM GOAL #1   Title  Patient will demonstrat a 35% limitaion on FOTO.    Baseline  58% limitation     Time  6    Period  Weeks    Status  On-going      PT LONG TERM GOAL #2   Title  Pt will increase hip flexor strength from a 4/5 to a 5/5 in order to stand at work greater than 30 mins with no pain.    Baseline  no increase in strength     Time  6    Period  Weeks    Status  On-going      PT LONG TERM GOAL #3   Title  Patient will increase core strength in odert o stabilize the lower back to perform daily functional activities.     Baseline  occasionally wakes with pain in the night    Time  6    Period  Weeks    Status  On-going      PT LONG TERM GOAL #4   Title  Patient will stand for 1 hour without increased pain in order to perfrom daily  tasks    Baseline  able    Time  8    Period  Weeks    Status  On-going      PT LONG TERM GOAL #5   Title  Patient will go up/down 8 steps with reciprocol gait pattenr with good control on the left to improve community safety.     Time  8    Status  New    Target Date  06/27/18            Plan - 07/04/18 1952    Clinical Impression Statement  Therapy needled the patients right parapsinal. He had a gfood twithc respose with all 4 needles. Therapy also perfromed soft tissue mobilization and long axis distraction to the right side. He continues to have spasming on the right.     Clinical Presentation  Evolving    Clinical Decision  Making  Moderate    PT Frequency  2x / week    PT Duration  4 weeks    PT Treatment/Interventions  ADLs/Self Care Home Management;Cryotherapy;Electrical Stimulation;Moist Heat;Traction;Ultrasound;Gait Scientist, forensic;Therapeutic activities;Therapeutic exercise;Patient/family education;Manual techniques;Passive range of motion;Dry needling;Taping    PT Next Visit Plan  continue with manual therapy; continue with dry needling; continue with stretching and strengthening as able. Progress back to HEP when able.     PT Home Exercise Plan  SLR; bridge, clamshell; standing hip abduction and extension; standing march , single leg bridge, clam,quadriped (on elbows/knees)  Hip flexor stretch,  quad stretch.  straight leg and bent leg lifts, minisquats with side steps.Multifitus press, press with knee flexion,  press with hip extension.    Consulted and Agree with Plan of Care  Patient       Patient will benefit from skilled therapeutic intervention in order to improve the following deficits and impairments:  Abnormal gait, Pain, Increased muscle spasms, Decreased activity tolerance, Decreased endurance, Decreased strength  Visit Diagnosis: Chronic right-sided low back pain without sciatica  Other muscle spasm  Difficulty in walking, not elsewhere classified     Problem List Patient Active Problem List   Diagnosis Date Noted  . Candidiasis   . Abnormality of gait   . Hypoalbuminemia due to protein-calorie malnutrition ([place])   . History of gout   . Sleep disturbance   . Constipation   . Rupture of quadriceps tendon, right, sequela 06/19/2017  . Quadriceps tendon rupture, left, sequela 06/19/2017  . Acute blood loss anemia 06/19/2017  . Fall   . History of subdural hematoma   . Post-operative pain   . Recurrent falls   . Quadriceps tendon rupture 06/15/2017  . Primary osteoarthritis of both knees 02/28/2017  . Primary osteoarthritis of both hands 02/28/2017  . Uricacidemia  02/28/2017  . Pain in joint of left knee 02/07/2017  . Idiopathic chronic gout, unspecified site, without tophus (tophi) 11/29/2016  . HEMATURIA UNSPECIFIED 01/25/2009  . ABDOMINAL PAIN 01/25/2009  . XERODERMA 03/10/2008  . UNS ADVRS EFF UNS RX MEDICINAL&BIOLOGICAL SBSTNC 03/10/2008  . ADJUSTMENT REACTION WITH PHYSICAL SYMPTOMS 02/14/2008  . MUSCLE SPASM 02/14/2008  . ACUTE PROSTATITIS 12/16/2007  . BREAST LUMP OR MASS, RIGHT 07/12/2007  . H/O: RCT (rotator cuff tear) 01/13/2007  . GOUT 01/08/2007    Carney Living PT DPT  07/04/2018, 7:57 PM  [facility] [address] [place], [place], [address] Phone: [phone]   Fax:  [phone]  Name: TAVIOUS GRIESINGER MRN: [id number] Date of Birth: Apr 28, 1951

## 2018-07-09 ENCOUNTER — Other Ambulatory Visit: Payer: Self-pay | Admitting: *Deleted

## 2018-07-09 MED ORDER — ALLOPURINOL 300 MG PO TABS: 300.0000 mg | ORAL_TABLET | Freq: Every day | ORAL | 1 refills | Status: DC

## 2018-07-09 NOTE — Telephone Encounter (Signed)
Refill request received via fax  Last Visit 05/10/18 Next Visit: 10/14/18 Labs: 03/29/18 CBC WNL. Glucose mildly elevated. Uric acid WNL.  Okay to refill per Dr. Estanislado Pandy

## 2018-07-10 ENCOUNTER — Ambulatory Visit (INDEPENDENT_AMBULATORY_CARE_PROVIDER_SITE_OTHER): Payer: Medicare Other | Admitting: Orthopedic Surgery

## 2018-07-10 ENCOUNTER — Encounter (INDEPENDENT_AMBULATORY_CARE_PROVIDER_SITE_OTHER): Payer: Self-pay | Admitting: Orthopedic Surgery

## 2018-07-10 DIAGNOSIS — G8918 Other acute postprocedural pain: Secondary | ICD-10-CM | POA: Diagnosis not present

## 2018-07-10 DIAGNOSIS — S76112S Strain of left quadriceps muscle, fascia and tendon, sequela: Secondary | ICD-10-CM | POA: Diagnosis not present

## 2018-07-10 MED ORDER — HYDROCODONE-ACETAMINOPHEN 5-325 MG PO TABS: ORAL_TABLET | ORAL | 0 refills | Status: DC

## 2018-07-10 MED ORDER — DICLOFENAC SODIUM 1 % TD GEL: TRANSDERMAL | 1 refills | Status: DC

## 2018-07-12 ENCOUNTER — Encounter (INDEPENDENT_AMBULATORY_CARE_PROVIDER_SITE_OTHER): Payer: Self-pay | Admitting: Orthopedic Surgery

## 2018-07-12 NOTE — Progress Notes (Signed)
Office Visit Note   Patient: Jose [name]           Date of Birth: 1951/04/23           MRN: [id number] Visit Date: 07/10/2018 Requested by: Jose Spurling, MD [address] [place], [place] [address] PCP: Jose Spurling, MD  Subjective: Chief Complaint  Patient presents with  . leg weakness    HPI: Jose [name] is a 67 year old patient with bilateral leg weakness.  Continues to report some weakness more in the left leg in the right.  He had a chronic rod Jose [name] on the left which was treated with repair at the same time as his acute right one.  The right one is done fine but the left quad has had some progressive detachment of that repaired tendon.  He has been doing stretching in the gym as well as machine work.  He does this on a regular basis.  He is in physical therapy and massage therapy helping his back.              ROS: All systems reviewed are negative as they relate to the chief complaint within the history of present illness.  Patient denies  fevers or chills.   Assessment & Plan: Visit Diagnoses:  1. Quadriceps tendon rupture, left, sequela   2. Post-op pain     Plan: Impression is left leg weakness with some progressive quad tendon detachment based on exam.  He is lacking about 10 degrees of full extension on the left.  The right knee looks good in terms of the repair.  I meant to have him keep using Voltaren gel.  Functionally he is doing well but he does have weakness.  He needs to wait about 3 months before doing anything surgically.  I think surgery based on the change in his physical exam but not strength is indicated to prevent further detachment.  I will see him back in 3 months for clinical recheck.  I did write him a one-time prescription for Norco.  Follow-Up Instructions: Return in about 3 months (around 10/10/2018).   Orders:  No orders of the defined types were placed in this encounter.  Meds ordered this encounter  Medications  .  HYDROcodone-acetaminophen (NORCO/VICODIN) 5-325 MG tablet    Sig: 1 po q 12hrs prn pain    Dispense:  30 tablet    Refill:  0  . diclofenac sodium (VOLTAREN) 1 % GEL    Sig: Apply to left knee prn pain daily    Dispense:  3 Tube    Refill:  1      Procedures: No procedures performed   Clinical Data: No additional findings.  Objective: Vital Signs: There were no vitals taken for this visit.  Physical Exam:   Constitutional: Patient appears well-developed HEENT:  Head: Normocephalic Eyes:EOM are normal Neck: Normal range of motion Cardiovascular: Normal rate Pulmonary/chest: Effort normal Neurologic: Patient is alert Skin: Skin is warm Psychiatric: Patient has normal mood and affect    Ortho Exam: Orthopedic exam demonstrates normal gait alignment.  On the right-hand side he has excellent leg extension with no effusion and good range of motion.  On the left he is lacking about 10 degrees of full extension and the defect between the quad tendon and the superior pole of the patella is now about 2 cm where it was less than 1 cm before.  There is no effusion.  He still has excellent range of motion and quad strength.  Specialty Comments:  No specialty comments available.  Imaging: No results found.   PMFS History: Patient Active Problem List   Diagnosis Date Noted  . Candidiasis   . Abnormality of gait   . Hypoalbuminemia due to protein-calorie malnutrition ([place])   . History of gout   . Sleep disturbance   . Constipation   . Rupture of quadriceps tendon, right, sequela 06/19/2017  . Quadriceps tendon rupture, left, sequela 06/19/2017  . Acute blood loss anemia 06/19/2017  . Fall   . History of subdural hematoma   . Post-operative pain   . Recurrent falls   . Quadriceps tendon rupture 06/15/2017  . Primary osteoarthritis of both knees 02/28/2017  . Primary osteoarthritis of both hands 02/28/2017  . Uricacidemia 02/28/2017  . Pain in joint of left knee  02/07/2017  . Idiopathic chronic gout, unspecified site, without tophus (tophi) 11/29/2016  . HEMATURIA UNSPECIFIED 01/25/2009  . ABDOMINAL PAIN 01/25/2009  . XERODERMA 03/10/2008  . UNS ADVRS EFF UNS RX MEDICINAL&BIOLOGICAL SBSTNC 03/10/2008  . ADJUSTMENT REACTION WITH PHYSICAL SYMPTOMS 02/14/2008  . MUSCLE SPASM 02/14/2008  . ACUTE PROSTATITIS 12/16/2007  . BREAST LUMP OR MASS, RIGHT 07/12/2007  . H/O: RCT (rotator cuff tear) 01/13/2007  . GOUT 01/08/2007   Past Medical History:  Diagnosis Date  . Arthritis    R shoulder, great toes, hands- has gout & has injections in knees with Dr. Estanislado Pandy   . Cancer ([place])    skin ca-   . GERD (gastroesophageal reflux disease)   . History of hiatal hernia   . SDH (subdural hematoma) (HCC)     Family History  Problem Relation Age of Onset  . Parkinson's disease Mother   . Heart disease Father   . Parkinson's disease Brother   . Diabetes Brother     Past Surgical History:  Procedure Laterality Date  . BRAIN SURGERY  2016   evacuation of SDH  . CARPAL TUNNEL RELEASE Bilateral   . GREEN LIGHT LASER TURP (TRANSURETHRAL RESECTION OF PROSTATE  04/27/2017  . KNEE ARTHROPLASTY    . QUADRICEPS TENDON REPAIR Bilateral 06/15/2017   Procedure: REPAIR QUADRICEP TENDON;  Surgeon: Meredith Pel, MD;  Location: [place];  Service: Orthopedics;  Laterality: Bilateral;  . SHOULDER SURGERY Right    Social History   Occupational History  . Not on file  Tobacco Use  . Smoking status: Never Smoker  . Smokeless tobacco: Never Used  Substance and Sexual Activity  . Alcohol use: Yes    Alcohol/week: 4.8 oz    Types: 1 Shots of liquor, 7 Glasses of wine per week    Comment: 2 drinks per day  . Drug use: No  . Sexual activity: Not on file

## 2018-07-16 ENCOUNTER — Ambulatory Visit: Payer: Medicare Other | Admitting: Physical Therapy

## 2018-07-16 ENCOUNTER — Telehealth: Payer: Self-pay | Admitting: Physical Therapy

## 2018-07-16 DIAGNOSIS — L570 Actinic keratosis: Secondary | ICD-10-CM | POA: Diagnosis not present

## 2018-07-16 NOTE — Telephone Encounter (Signed)
Left phone message about missed visit today.  Informed him of next appointment date and time.  I asked him to call if he no longer needs PT.  Our number given.  I asked him to refer to our attendance policy for missed visits. Melvenia Needles PTA

## 2018-07-17 DIAGNOSIS — F438 Other reactions to severe stress: Secondary | ICD-10-CM | POA: Diagnosis not present

## 2018-07-18 DIAGNOSIS — D485 Neoplasm of uncertain behavior of skin: Secondary | ICD-10-CM | POA: Diagnosis not present

## 2018-07-18 DIAGNOSIS — L905 Scar conditions and fibrosis of skin: Secondary | ICD-10-CM | POA: Diagnosis not present

## 2018-07-18 DIAGNOSIS — L578 Other skin changes due to chronic exposure to nonionizing radiation: Secondary | ICD-10-CM | POA: Diagnosis not present

## 2018-07-18 DIAGNOSIS — L570 Actinic keratosis: Secondary | ICD-10-CM | POA: Diagnosis not present

## 2018-07-19 ENCOUNTER — Ambulatory Visit: Payer: Medicare Other | Attending: Orthopedic Surgery | Admitting: Physical Therapy

## 2018-07-19 DIAGNOSIS — G8929 Other chronic pain: Secondary | ICD-10-CM | POA: Insufficient documentation

## 2018-07-19 DIAGNOSIS — S76111S Strain of right quadriceps muscle, fascia and tendon, sequela: Secondary | ICD-10-CM | POA: Insufficient documentation

## 2018-07-19 DIAGNOSIS — X58XXXS Exposure to other specified factors, sequela: Secondary | ICD-10-CM | POA: Insufficient documentation

## 2018-07-19 DIAGNOSIS — M62838 Other muscle spasm: Secondary | ICD-10-CM | POA: Insufficient documentation

## 2018-07-19 DIAGNOSIS — R262 Difficulty in walking, not elsewhere classified: Secondary | ICD-10-CM | POA: Insufficient documentation

## 2018-07-19 DIAGNOSIS — M545 Low back pain: Secondary | ICD-10-CM | POA: Insufficient documentation

## 2018-07-19 DIAGNOSIS — R2689 Other abnormalities of gait and mobility: Secondary | ICD-10-CM | POA: Insufficient documentation

## 2018-07-19 DIAGNOSIS — M25562 Pain in left knee: Secondary | ICD-10-CM | POA: Insufficient documentation

## 2018-07-19 DIAGNOSIS — M25561 Pain in right knee: Secondary | ICD-10-CM | POA: Insufficient documentation

## 2018-07-22 ENCOUNTER — Ambulatory Visit: Payer: Medicare Other | Admitting: Physical Therapy

## 2018-07-22 ENCOUNTER — Encounter: Payer: Self-pay | Admitting: Physical Therapy

## 2018-07-22 DIAGNOSIS — G8929 Other chronic pain: Secondary | ICD-10-CM

## 2018-07-22 DIAGNOSIS — R2689 Other abnormalities of gait and mobility: Secondary | ICD-10-CM | POA: Diagnosis not present

## 2018-07-22 DIAGNOSIS — M545 Low back pain, unspecified: Secondary | ICD-10-CM

## 2018-07-22 DIAGNOSIS — M25561 Pain in right knee: Secondary | ICD-10-CM | POA: Diagnosis not present

## 2018-07-22 DIAGNOSIS — X58XXXS Exposure to other specified factors, sequela: Secondary | ICD-10-CM | POA: Diagnosis not present

## 2018-07-22 DIAGNOSIS — M25562 Pain in left knee: Secondary | ICD-10-CM | POA: Diagnosis not present

## 2018-07-22 DIAGNOSIS — F438 Other reactions to severe stress: Secondary | ICD-10-CM | POA: Diagnosis not present

## 2018-07-22 DIAGNOSIS — M62838 Other muscle spasm: Secondary | ICD-10-CM | POA: Diagnosis not present

## 2018-07-22 DIAGNOSIS — R262 Difficulty in walking, not elsewhere classified: Secondary | ICD-10-CM | POA: Diagnosis not present

## 2018-07-22 DIAGNOSIS — S76111S Strain of right quadriceps muscle, fascia and tendon, sequela: Secondary | ICD-10-CM

## 2018-07-22 NOTE — Patient Instructions (Signed)
Do more SLR's at home

## 2018-07-22 NOTE — Therapy (Signed)
[place], [place], [address] Phone: [phone]   Fax:  [phone]  Physical Therapy Treatment  Patient Details  Name: Jose [name] MRN: [id number] Date of Birth: Dec 31, 1950 Referring Provider: Dr Heather Roberts    Encounter Date: 07/22/2018  PT End of Session - 07/22/18 1735    Visit Number  26    Number of Visits  31    Date for PT Re-Evaluation  07/25/18    PT Start Time  0846    PT Stop Time  0943    PT Time Calculation (min)  57 min    Activity Tolerance  Patient tolerated treatment well    Behavior During Therapy  [facility] for tasks assessed/performed       Past Medical History:  Diagnosis Date  . Arthritis    R shoulder, great toes, hands- has gout & has injections in knees with Dr. Estanislado Pandy   . Cancer ([place])    skin ca-   . GERD (gastroesophageal reflux disease)   . History of hiatal hernia   . SDH (subdural hematoma) (HCC)     Past Surgical History:  Procedure Laterality Date  . BRAIN SURGERY  2016   evacuation of SDH  . CARPAL TUNNEL RELEASE Bilateral   . GREEN LIGHT LASER TURP (TRANSURETHRAL RESECTION OF PROSTATE  04/27/2017  . KNEE ARTHROPLASTY    . QUADRICEPS TENDON REPAIR Bilateral 06/15/2017   Procedure: REPAIR QUADRICEP TENDON;  Surgeon: Meredith Pel, MD;  Location: [facility];  Service: Orthopedics;  Laterality: Bilateral;  . SHOULDER SURGERY Right     There were no vitals filed for this visit.  Subjective Assessment - 07/22/18 0848    Subjective  Not having the same stiffness getting out of bed.  He has been seeing a massage therapist.  Low back is gettig better.  He has been stretching at the gym.  upper back 4 to 7/10 nEW.   Waiting until Oct to decide about 2nd surgery..  hamstring better with stretching.    Has been doing more core and single leg bridges,  squats   and more.      Currently in Pain?  Yes    Pain Location  Back upper back new pain  up to 7/10,  Feels  pressure in low back  vs pain    Pain Orientation  Upper;Lower;Left    Pain Descriptors / Indicators  Pressure;Sore    Pain Type  Chronic pain    Pain Score  -- not a lot    Pain Location  Knee    Pain Orientation  Right;Left    Pain Descriptors / Indicators  -- It just does not feel right.    Aggravating Factors   longer sitting    Pain Relieving Factors  moving legs helpful                       [facility] Adult PT Treatment/Exercise - 07/22/18 0001      Lumbar Exercises: Standing   Heel Raises  10 reps both and single  fatigue needed to hold doorway      Lumbar Exercises: Supine   Bridge  10 reps    Bridge Limitations  cued for higher lifts    Straight Leg Raise  --    Other Supine Lumbar Exercises  single leg flexion/ extension with abdominal bracing  10 X abdominal control improving    Other Supine Lumbar Exercises  Decompression  series 5 x 5 seconds shouder , head and leg press,  leg lengthener,   leg lengthener right increased pressure on the left.        Knee/Hip Exercises: Standing   Functional Squat  10 reps    Functional Squat Limitations  needed cues for knee position suggested 5 second holds to increase challange.  he was able to hold 5 seconds X 2    SLS  4 seconds,  wobbley each    Other Standing Knee Exercises  wall slides facing wall 10 x each  fatigue needed min assist for balance    Cued to keep knees slightly flexed to avoid locking.        Knee/Hip Exercises: Supine   Bridges  10 reps    Bridges Limitations  single small lift so stopped,  cued for highter lift using both    Straight Leg Raises  10 reps    Straight Leg Raises Limitations  quad lag LT>RT      Knee/Hip Exercises: Sidelying   Clams  10 X each      Cryotherapy   Number Minutes Cryotherapy  10 Minutes    Cryotherapy Location  Lumbar Spine;Knee both    Type of Cryotherapy  -- Cols pack               PT Short Term Goals - 05/14/18 1551      PT SHORT TERM GOAL #1    Title  Patient will become independent with HEP for core strength and stretching.    Baseline  Patient independent with HEP    Time  3    Period  Weeks    Status  Achieved      PT SHORT TERM GOAL #2   Title  Patient will decrease self reported tenderness in the lumbar paraspinals with palpation.    Baseline  80 % better    Time  3    Period  Weeks    Status  Achieved      PT SHORT TERM GOAL #3   Title  Patient will increase lumbar extensbility by 25%.    Baseline  slight limitation     Time  3    Period  Weeks    Status  On-going      PT SHORT TERM GOAL #4   Title  Patient will demonstrete 4+/5 gross left lower extremity strength     Time  4    Period  Weeks    Status  On-going      PT SHORT TERM GOAL #5   Title  Patient will increase left single leg stance time to 7 seconds     Time  4    Period  Weeks    Status  On-going        PT Long Term Goals - 07/22/18 3762      PT LONG TERM GOAL #1   Title  Patient will demonstrat a 35% limitaion on FOTO.    Time  6    Period  Weeks    Status  Unable to assess      PT LONG TERM GOAL #2   Title  Pt will increase hip flexor strength from a 4/5 to a 5/5 in order to stand at work greater than 30 mins with no pain.    Time  6    Period  Weeks    Status  Unable to assess      PT LONG TERM GOAL #3  Title  Patient will increase core strength in odert o stabilize the lower back to perform daily functional activities.     Baseline  observed increased core control during exercise today    Time  6    Period  Weeks    Status  Partially Met      PT LONG TERM GOAL #4   Title  Patient will stand for 1 hour without increased pain in order to perfrom daily tasks    Baseline  able    Time  8    Period  Weeks    Status  Achieved      PT LONG TERM GOAL #5   Title  Patient will go up/down 8 steps with reciprocol gait pattenr with good control on the left to improve community safety.     Baseline  can go reciprocol with rail/s.   Fair control    Time  8    Period  Weeks    Status  On-going            Plan - 07/22/18 1736    Clinical Impression Statement  Patient has been working in gym and with his HEP. LTG#3 partially met.  LTG#4 met.  He continues to have a quad lag with SLR's. Current POC ends 07/25/2018.  Attendance intermittant.  Abdominal control improving as noted with supine hip knee flexion/ extension.  Back pain has moved to upper back.  patient denies gym workout the cause.      PT Next Visit Plan  continue with manual therapy; continue with dry needling; continue with stretching and strengthening as able. Progress back to HEP when able. Current POC through 07/25/2018.    PT Home Exercise Plan  SLR; bridge, clamshell; standing hip abduction and extension; standing march , single leg bridge, clam,quadriped (on elbows/knees)  Hip flexor stretch,  quad stretch.  straight leg and bent leg lifts, minisquats with side steps.Multifitus press, press with knee flexion,  press with hip extension.    Consulted and Agree with Plan of Care  Patient       Patient will benefit from skilled therapeutic intervention in order to improve the following deficits and impairments:     Visit Diagnosis: Chronic right-sided low back pain without sciatica  Other muscle spasm  Difficulty in walking, not elsewhere classified  Acute pain of left knee  Acute pain of right knee  Rupture of quadriceps tendon, right, sequela  Other abnormalities of gait and mobility     Problem List Patient Active Problem List   Diagnosis Date Noted  . Candidiasis   . Abnormality of gait   . Hypoalbuminemia due to protein-calorie malnutrition ([place])   . History of gout   . Sleep disturbance   . Constipation   . Rupture of quadriceps tendon, right, sequela 06/19/2017  . Quadriceps tendon rupture, left, sequela 06/19/2017  . Acute blood loss anemia 06/19/2017  . Fall   . History of subdural hematoma   . Post-operative pain   .  Recurrent falls   . Quadriceps tendon rupture 06/15/2017  . Primary osteoarthritis of both knees 02/28/2017  . Primary osteoarthritis of both hands 02/28/2017  . Uricacidemia 02/28/2017  . Pain in joint of left knee 02/07/2017  . Idiopathic chronic gout, unspecified site, without tophus (tophi) 11/29/2016  . HEMATURIA UNSPECIFIED 01/25/2009  . ABDOMINAL PAIN 01/25/2009  . XERODERMA 03/10/2008  . UNS ADVRS EFF UNS RX MEDICINAL&BIOLOGICAL SBSTNC 03/10/2008  . ADJUSTMENT REACTION WITH PHYSICAL SYMPTOMS 02/14/2008  .  MUSCLE SPASM 02/14/2008  . ACUTE PROSTATITIS 12/16/2007  . BREAST LUMP OR MASS, RIGHT 07/12/2007  . H/O: RCT (rotator cuff tear) 01/13/2007  . GOUT 01/08/2007    Carly Applegate PTA 07/22/2018, 5:46 PM  [facility] [address] [place], [place], [address] Phone: [phone]   Fax:  [phone]  Name: NGAI PARCELL MRN: [id number] Date of Birth: [birth year]

## 2018-07-24 ENCOUNTER — Ambulatory Visit (INDEPENDENT_AMBULATORY_CARE_PROVIDER_SITE_OTHER): Payer: Medicare Other | Admitting: Physician Assistant

## 2018-07-24 ENCOUNTER — Encounter: Payer: Self-pay | Admitting: Physician Assistant

## 2018-07-24 VITALS — BP 121/92 | HR 67 | Resp 14 | Ht 72.0 in | Wt 207.0 lb

## 2018-07-24 DIAGNOSIS — M170 Bilateral primary osteoarthritis of knee: Secondary | ICD-10-CM

## 2018-07-24 DIAGNOSIS — M1A09X0 Idiopathic chronic gout, multiple sites, without tophus (tophi): Secondary | ICD-10-CM

## 2018-07-24 DIAGNOSIS — S76111S Strain of right quadriceps muscle, fascia and tendon, sequela: Secondary | ICD-10-CM

## 2018-07-24 DIAGNOSIS — S76112S Strain of left quadriceps muscle, fascia and tendon, sequela: Secondary | ICD-10-CM

## 2018-07-24 DIAGNOSIS — A809 Acute poliomyelitis, unspecified: Secondary | ICD-10-CM

## 2018-07-24 DIAGNOSIS — Z8739 Personal history of other diseases of the musculoskeletal system and connective tissue: Secondary | ICD-10-CM | POA: Diagnosis not present

## 2018-07-24 DIAGNOSIS — M19042 Primary osteoarthritis, left hand: Secondary | ICD-10-CM | POA: Diagnosis not present

## 2018-07-24 DIAGNOSIS — M19041 Primary osteoarthritis, right hand: Secondary | ICD-10-CM

## 2018-07-24 DIAGNOSIS — G629 Polyneuropathy, unspecified: Secondary | ICD-10-CM | POA: Diagnosis not present

## 2018-07-24 DIAGNOSIS — M65312 Trigger thumb, left thumb: Secondary | ICD-10-CM

## 2018-07-24 DIAGNOSIS — S065XAA Traumatic subdural hemorrhage with loss of consciousness status unknown, initial encounter: Secondary | ICD-10-CM

## 2018-07-24 DIAGNOSIS — S065X9A Traumatic subdural hemorrhage with loss of consciousness of unspecified duration, initial encounter: Secondary | ICD-10-CM

## 2018-07-24 MED ORDER — COLCHICINE 0.6 MG PO TABS: 0.6000 mg | ORAL_TABLET | Freq: Every day | ORAL | 3 refills | Status: DC | PRN

## 2018-07-24 MED ORDER — TRIAMCINOLONE ACETONIDE 40 MG/ML IJ SUSP
10.0000 mg | INTRAMUSCULAR | Status: AC | PRN
  Administered 2018-07-24: 10 mg

## 2018-07-24 MED ORDER — LIDOCAINE HCL 1 % IJ SOLN
0.5000 mL | INTRAMUSCULAR | Status: AC | PRN
  Administered 2018-07-24: .5 mL

## 2018-07-24 NOTE — Progress Notes (Signed)
Office Visit Note  Patient: Jose [name]             Date of Birth: [birth year]           MRN: [id number]             PCP: [name], MD Referring: [name], MD Visit Date: 07/24/2018 Occupation: @GUAROCC @  Subjective:  Left thumb pain  History of Present Illness: Jose [name] is a 67 y.o. male with history of gout and osteoarthritis.  Patient takes allopurinol 300 mg po daily and colchicine 0.6 mg po PRN. He denies any recent gout flares in over 5 years.  He needs a refill of colchicine today.  He states the continues to have chronic bilateral knee pain.  He wears a left knee brace due to his left knee giving out on him periodically.  He states he will be following up with Dr. Marlou Sa in October 2019 to further discuss another left knee operation.  He denies any knee joint swelling.  He states several months ago he was experiencing lower back pain and muscle spasms.  He was evaluated by Dr. Ernestina Patches and had several injections performed which provided significant relief.  He presents today with left thumb pain.  He has pain with ROM of the left first MCP and PIP joint.  He reports mild swelling.  He reports that at times the left thumb will lock and he has to manually extend the first PIP joint.  He continues to have pain in the right 4th and 5th PIP joints.   Activities of Daily Living:  Patient reports morning stiffness for 15-20  minutes.   Patient Reports nocturnal pain.  Difficulty dressing/grooming: Denies Difficulty climbing stairs: Reports Difficulty getting out of chair: Reports Difficulty using hands for taps, buttons, cutlery, and/or writing: Denies  Review of Systems  Constitutional: Negative for fatigue and night sweats.  HENT: Positive for mouth dryness. Negative for mouth sores and nose dryness.   Eyes: Positive for dryness. Negative for redness and visual disturbance.  Respiratory: Negative for cough, hemoptysis, shortness of breath and difficulty breathing.    Cardiovascular: Negative for chest pain, palpitations, hypertension, irregular heartbeat and swelling in legs/feet.  Gastrointestinal: Negative for blood in stool, constipation and diarrhea.  Endocrine: Negative for increased urination.  Genitourinary: Negative for painful urination.  Musculoskeletal: Positive for arthralgias, joint pain, joint swelling and morning stiffness. Negative for myalgias, muscle weakness, muscle tenderness and myalgias.  Skin: Negative for color change, rash, hair loss, nodules/bumps, skin tightness, ulcers and sensitivity to sunlight.  Allergic/Immunologic: Negative for susceptible to infections.  Neurological: Negative for dizziness, fainting, memory loss, night sweats and weakness.  Hematological: Negative for swollen glands.  Psychiatric/Behavioral: Negative for depressed mood and sleep disturbance. The patient is not nervous/anxious.     PMFS History:  Patient Active Problem List   Diagnosis Date Noted  . Candidiasis   . Abnormality of gait   . Hypoalbuminemia due to protein-calorie malnutrition ([place])   . History of gout   . Sleep disturbance   . Constipation   . Rupture of quadriceps tendon, right, sequela 06/19/2017  . Quadriceps tendon rupture, left, sequela 06/19/2017  . Acute blood loss anemia 06/19/2017  . Fall   . History of subdural hematoma   . Post-operative pain   . Recurrent falls   . Quadriceps tendon rupture 06/15/2017  . Primary osteoarthritis of both knees 02/28/2017  . Primary osteoarthritis of both hands 02/28/2017  . Uricacidemia 02/28/2017  .  Pain in joint of left knee 02/07/2017  . Idiopathic chronic gout, unspecified site, without tophus (tophi) 11/29/2016  . HEMATURIA UNSPECIFIED 01/25/2009  . ABDOMINAL PAIN 01/25/2009  . XERODERMA 03/10/2008  . UNS ADVRS EFF UNS RX MEDICINAL&BIOLOGICAL SBSTNC 03/10/2008  . ADJUSTMENT REACTION WITH PHYSICAL SYMPTOMS 02/14/2008  . MUSCLE SPASM 02/14/2008  . ACUTE PROSTATITIS 12/16/2007    . BREAST LUMP OR MASS, RIGHT 07/12/2007  . H/O: RCT (rotator cuff tear) 01/13/2007  . GOUT 01/08/2007    Past Medical History:  Diagnosis Date  . Arthritis    R shoulder, great toes, hands- has gout & has injections in knees with Dr. Estanislado Pandy   . Cancer ([place])    skin ca-   . GERD (gastroesophageal reflux disease)   . History of hiatal hernia   . SDH (subdural hematoma) (HCC)     Family History  Problem Relation Age of Onset  . Parkinson's disease Mother   . Heart disease Father   . Parkinson's disease Brother   . Diabetes Brother    Past Surgical History:  Procedure Laterality Date  . BRAIN SURGERY  2016   evacuation of SDH  . CARPAL TUNNEL RELEASE Bilateral   . GREEN LIGHT LASER TURP (TRANSURETHRAL RESECTION OF PROSTATE  04/27/2017  . KNEE ARTHROPLASTY    . QUADRICEPS TENDON REPAIR Bilateral 06/15/2017   Procedure: REPAIR QUADRICEP TENDON;  Surgeon: Meredith Pel, MD;  Location: [facility];  Service: Orthopedics;  Laterality: Bilateral;  . SHOULDER SURGERY Right    Social History   Social History Narrative  . Not on file    Objective: Vital Signs: BP (!) 121/92 (BP Location: Left Arm, Patient Position: Sitting, Cuff Size: Normal)   Pulse 67   Resp 14   Ht 6' (1.829 m)   Wt 207 lb (93.9 kg)   BMI 28.07 kg/m    Physical Exam  Constitutional: He is oriented to person, place, and time. He appears well-developed and well-nourished.  HENT:  Head: Normocephalic and atraumatic.  Eyes: Pupils are equal, round, and reactive to light. Conjunctivae and EOM are normal.  Neck: Normal range of motion. Neck supple.  Cardiovascular: Normal rate, regular rhythm and normal heart sounds.  Pulmonary/Chest: Effort normal and breath sounds normal.  Abdominal: Soft. Bowel sounds are normal.  Lymphadenopathy:    He has no cervical adenopathy.  Neurological: He is alert and oriented to person, place, and time.  Skin: Skin is warm and dry. Capillary refill takes less than 2  seconds.  Psychiatric: He has a normal mood and affect. His behavior is normal.  Nursing note and vitals reviewed.    Musculoskeletal Exam: C-spine, thoracic spine, and lumbar spine good ROM.  No midline spinal tenderness.  No SI joint tenderness. Shoulder abduction to about 90 degrees bilaterally.  Limited ROM of left elbow joint.  Right elbow full ROM.  Wrist joints, MCPs, PIPS, and DIPs good ROM with no synovitis.  Tenderness of right 4th and 5th PIP and left 1st PIP and MCP joints.  DIP synovial thickening.  Hip joints good ROM.  No tenderness of trochanteric bursa.  Limited extension of bilateral knee joints.  He has a left knee brace on.  No warmth or effusion noted. Ankle joints good ROM with no synovitis.    CDAI Exam: No CDAI exam completed.   Investigation: No additional findings.  Imaging: No results found.  Recent Labs: Lab Results  Component Value Date   WBC 6.5 03/29/2018   HGB 13.9 03/29/2018  PLT 193 03/29/2018   NA 144 03/29/2018   K 4.7 03/29/2018   CL 108 03/29/2018   CO2 31 03/29/2018   GLUCOSE 118 (H) 03/29/2018   BUN 19 03/29/2018   CREATININE 1.01 03/29/2018   BILITOT 0.4 03/29/2018   ALKPHOS 48 06/20/2017   AST 18 03/29/2018   ALT 24 03/29/2018   PROT 5.9 (L) 03/29/2018   ALBUMIN 2.6 (L) 06/20/2017   CALCIUM 9.4 03/29/2018   GFRAA 88 03/29/2018    Speciality Comments: No specialty comments available.  Procedures:  Hand/UE Inj: L thumb A1 for trigger finger on 07/24/2018 10:26 AM Indications: pain Details: 27 G needle, ultrasound-guided volar approach Medications: 0.5 mL lidocaine 1 %; 10 mg triamcinolone acetonide 40 MG/ML    Allergies: Patient has no known allergies.   Assessment / Plan:     Visit Diagnoses: Idiopathic chronic gout of multiple sites without tophus -  He has not had any recent gout flares in over 5 years.  He continues take allopurinol 300 mg by mouth daily.  He takes colchicine 0.6 mg as needed if he develops a flare.  He  needs a refill today.  He continues to avoid dietary triggers.  His uric acid was 4.8 on 03/29/2018.    Primary osteoarthritis of both hands: He has PIP and DIP synovial thickening consistent with osteoarthritis of bilateral hands.  He is complete fist formation bilaterally.  He has tenderness in the right fourth and fifth PIP joints.  Left first PIP and first MCP joint.  We discussed the use of Voltaren gel.  He was given a handout of hand exercises that he can perform at home.  Joint protection and muscle strengthening were discussed.  Trigger thumb, left thumb: He has a left trigger thumb.  We discussed using voltaren gel topically.  He requested a cortisone injection today in the office.  He tolerated the procedure well.    Primary osteoarthritis of both knees: No warmth or effusion.  Has limited extension of bilateral knee joints.  He has been wearing a left knee brace to prevent the sensation of his left knee giving way.  He will be following up with Dr. Marlou Sa in October 2019 to discuss a future left knee surgery.  Other medical conditions are listed as follows:  Subdural hematoma (HCC)  Neuropathy  [place]  Quadriceps tendon rupture, left, sequela  Rupture of quadriceps tendon, right, sequela  H/O: RCT (rotator cuff tear)   Orders: Orders Placed This Encounter  Procedures  . Hand/UE Inj   No orders of the defined types were placed in this encounter.   Face-to-face time spent with patient was 30 minutes. Greater than 50% of time was spent in counseling and coordination of care.  Follow-Up Instructions: Return in about 6 months (around 01/24/2019) for Gout, Osteoarthritis.   Ofilia Neas, PA-C   I examined and evaluated the patient with Hazel Sams PA.  Patient had left trigger thumb.  After different treatment options were discussed left thumb flexor tendon was injected with cortisone as described above.  A splint was applied.  Post procedure precautions were discussed.  The  plan of care was discussed as noted above.  Bo Merino, MD  Note - This record has been created using Editor, commissioning.  Chart creation errors have been sought, but may not always  have been located. Such creation errors do not reflect on  the standard of medical care.

## 2018-07-24 NOTE — Patient Instructions (Signed)

## 2018-07-26 ENCOUNTER — Ambulatory Visit: Payer: Medicare Other | Admitting: Physical Therapy

## 2018-07-26 ENCOUNTER — Encounter: Payer: Self-pay | Admitting: Physical Therapy

## 2018-07-26 DIAGNOSIS — M25562 Pain in left knee: Secondary | ICD-10-CM

## 2018-07-26 DIAGNOSIS — R262 Difficulty in walking, not elsewhere classified: Secondary | ICD-10-CM | POA: Diagnosis not present

## 2018-07-26 DIAGNOSIS — M62838 Other muscle spasm: Secondary | ICD-10-CM | POA: Diagnosis not present

## 2018-07-26 DIAGNOSIS — M25561 Pain in right knee: Secondary | ICD-10-CM

## 2018-07-26 DIAGNOSIS — G8929 Other chronic pain: Secondary | ICD-10-CM | POA: Diagnosis not present

## 2018-07-26 DIAGNOSIS — M545 Low back pain, unspecified: Secondary | ICD-10-CM

## 2018-07-26 NOTE — Therapy (Signed)
[place], [place], [address] Phone: [phone]   Fax:  [phone]  Physical Therapy Treatment  Patient Details  Name: Jose [name] MRN: [id number] Date of Birth: 1951-09-08 Referring Provider: Dr Heather Roberts    Encounter Date: 07/26/2018  PT End of Session - 07/26/18 0910    Visit Number  27    Number of Visits  31    Date for PT Re-Evaluation  08/23/18    Authorization Type  [employer] blue shield     PT Start Time  0845    PT Stop Time  0928    PT Time Calculation (min)  43 min    Activity Tolerance  Patient tolerated treatment well    Behavior During Therapy  [facility] for tasks assessed/performed       Past Medical History:  Diagnosis Date  . Arthritis    R shoulder, great toes, hands- has gout & has injections in knees with Dr. Estanislado Pandy   . Cancer ([place])    skin ca-   . GERD (gastroesophageal reflux disease)   . History of hiatal hernia   . SDH (subdural hematoma) (HCC)     Past Surgical History:  Procedure Laterality Date  . BRAIN SURGERY  2016   evacuation of SDH  . CARPAL TUNNEL RELEASE Bilateral   . GREEN LIGHT LASER TURP (TRANSURETHRAL RESECTION OF PROSTATE  04/27/2017  . KNEE ARTHROPLASTY    . QUADRICEPS TENDON REPAIR Bilateral 06/15/2017   Procedure: REPAIR QUADRICEP TENDON;  Surgeon: Meredith Pel, MD;  [facility];  Service: Orthopedics;  Laterality: Bilateral;  . SHOULDER SURGERY Right     There were no vitals filed for this visit.  Subjective Assessment - 07/26/18 1043    Subjective  Patient reports his back has been a little sore the past few days. He has been working on his exercises and going to a Clinical research associate.     Limitations  Lifting    How long can you stand comfortably?  <83mns     Diagnostic tests  noting post op    Currently in Pain?  Yes    Pain Score  4     Pain Location  Back    Pain Orientation  Upper;Lower;Left    Pain Descriptors /  Indicators  Pressure;Sore    Pain Type  Chronic pain    Pain Onset  More than a month ago    Pain Frequency  Intermittent    Aggravating Factors   feels funny     Pain Relieving Factors  movement     Effect of Pain on Daily Activities  some pain with activity          [facility] PT Assessment - 07/26/18 0001      AROM   Lumbar Flexion  50% limited     Lumbar Extension  50% limited     Lumbar - Right Side Bend  No increased pain today       Strength   Right Hip Flexion  4+/5    Right Knee Flexion  4+/5    Right Knee Extension  4+/5    Left Knee Flexion  4+/5    Left Knee Extension  3+/5                   [facility] Adult PT Treatment/Exercise - 07/26/18 0001      Lumbar Exercises: Supine   Other Supine Lumbar Exercises  SLR 2x20  each leg     Other Supine Lumbar Exercises  Double Knee to chest with ball 2x10       Knee/Hip Exercises: Machines for Strengthening   Cybex Leg Press  60lb 2x20       Knee/Hip Exercises: Standing   Functional Squat  20 reps    Functional Squat Limitations  needed cues for knee position suggested 5 second holds to increase challange.  he was able to hold 5 seconds X 2    SLS  10sec hold 3x bilateral with min UE assit     Other Standing Knee Exercises  wall slides facing wall 10 x each  fatigue needed min assist for balance    Cued to keep knees slightly flexed to avoid locking.        Knee/Hip Exercises: Supine   Bridges  20 reps    Bridges Limitations  on ball              PT Education - 07/26/18 0950    Education provided  Yes    Education Details  reviewed tehcniue qith ther-ex     Person(s) Educated  Patient    Methods  Explanation;Demonstration;Tactile cues;Verbal cues;Handout    Comprehension  Verbalized understanding;Returned demonstration;Tactile cues required;Verbal cues required       PT Short Term Goals - 05/14/18 1551      PT SHORT TERM GOAL #1   Title  Patient will become independent with HEP for core strength and  stretching.    Baseline  Patient independent with HEP    Time  3    Period  Weeks    Status  Achieved      PT SHORT TERM GOAL #2   Title  Patient will decrease self reported tenderness in the lumbar paraspinals with palpation.    Baseline  80 % better    Time  3    Period  Weeks    Status  Achieved      PT SHORT TERM GOAL #3   Title  Patient will increase lumbar extensbility by 25%.    Baseline  slight limitation     Time  3    Period  Weeks    Status  On-going      PT SHORT TERM GOAL #4   Title  Patient will demonstrete 4+/5 gross left lower extremity strength     Time  4    Period  Weeks    Status  On-going      PT SHORT TERM GOAL #5   Title  Patient will increase left single leg stance time to 7 seconds     Time  4    Period  Weeks    Status  On-going        PT Long Term Goals - 07/22/18 4696      PT LONG TERM GOAL #1   Title  Patient will demonstrat a 35% limitaion on FOTO.    Time  6    Period  Weeks    Status  Unable to assess      PT LONG TERM GOAL #2   Title  Pt will increase hip flexor strength from a 4/5 to a 5/5 in order to stand at work greater than 30 mins with no pain.    Time  6    Period  Weeks    Status  Unable to assess      PT LONG TERM GOAL #3   Title  Patient will increase  core strength in odert o stabilize the lower back to perform daily functional activities.     Baseline  observed increased core control during exercise today    Time  6    Period  Weeks    Status  Partially Met      PT LONG TERM GOAL #4   Title  Patient will stand for 1 hour without increased pain in order to perfrom daily tasks    Baseline  able    Time  8    Period  Weeks    Status  Achieved      PT LONG TERM GOAL #5   Title  Patient will go up/down 8 steps with reciprocol gait pattenr with good control on the left to improve community safety.     Baseline  can go reciprocol with rail/s.  Fair control    Time  8    Period  Weeks    Status  On-going             Plan - 07/26/18 0912    Clinical Impression Statement  Patient has made progress. He has improved strength on the left and an improved FOTO score. His back is still hurting but he has been going to a massage therapist and going to the gym. He will finish his 2 weeks scheduled then D/C to HEP. Therap yfocused on quad and core strengthening today,    Clinical Presentation  Evolving    Clinical Decision Making  Moderate    Rehab Potential  Good    PT Frequency  2x / week    PT Duration  4 weeks    PT Treatment/Interventions  ADLs/Self Care Home Management;Cryotherapy;Electrical Stimulation;Moist Heat;Traction;Ultrasound;Gait Scientist, forensic;Therapeutic activities;Therapeutic exercise;Patient/family education;Manual techniques;Passive range of motion;Dry needling;Taping    PT Next Visit Plan  continue with manual therapy; continue with dry needling; continue with stretching and strengthening as able. Progress back to HEP when able. Current POC through 07/25/2018.    PT Home Exercise Plan  SLR; bridge, clamshell; standing hip abduction and extension; standing march , single leg bridge, clam,quadriped (on elbows/knees)  Hip flexor stretch,  quad stretch.  straight leg and bent leg lifts, minisquats with side steps.Multifitus press, press with knee flexion,  press with hip extension.    Consulted and Agree with Plan of Care  Patient       Patient will benefit from skilled therapeutic intervention in order to improve the following deficits and impairments:  Abnormal gait, Pain, Increased muscle spasms, Decreased activity tolerance, Decreased endurance, Decreased strength  Visit Diagnosis: Chronic right-sided low back pain without sciatica  Other muscle spasm  Difficulty in walking, not elsewhere classified  Acute pain of left knee  Acute pain of right knee     Problem List Patient Active Problem List   Diagnosis Date Noted  . Candidiasis   . Abnormality of gait   .  Hypoalbuminemia due to protein-calorie malnutrition ([place])   . History of gout   . Sleep disturbance   . Constipation   . Rupture of quadriceps tendon, right, sequela 06/19/2017  . Quadriceps tendon rupture, left, sequela 06/19/2017  . Acute blood loss anemia 06/19/2017  . Fall   . History of subdural hematoma   . Post-operative pain   . Recurrent falls   . Quadriceps tendon rupture 06/15/2017  . Primary osteoarthritis of both knees 02/28/2017  . Primary osteoarthritis of both hands 02/28/2017  . Uricacidemia 02/28/2017  . Pain in joint of left knee 02/07/2017  .  Idiopathic chronic gout, unspecified site, without tophus (tophi) 11/29/2016  . HEMATURIA UNSPECIFIED 01/25/2009  . ABDOMINAL PAIN 01/25/2009  . XERODERMA 03/10/2008  . UNS ADVRS EFF UNS RX MEDICINAL&BIOLOGICAL SBSTNC 03/10/2008  . ADJUSTMENT REACTION WITH PHYSICAL SYMPTOMS 02/14/2008  . MUSCLE SPASM 02/14/2008  . ACUTE PROSTATITIS 12/16/2007  . BREAST LUMP OR MASS, RIGHT 07/12/2007  . H/O: RCT (rotator cuff tear) 01/13/2007  . GOUT 01/08/2007    Carney Living PT DPT  07/26/2018, 10:47 AM  [facility] [address] [place], [place], [address] Phone: [phone]   Fax:  [phone]  Name: Jose [name] MRN: [id number] Date of Birth: 1951-05-13

## 2018-07-28 ENCOUNTER — Ambulatory Visit (HOSPITAL_COMMUNITY)
Admission: EM | Admit: 2018-07-28 | Discharge: 2018-07-28 | Disposition: A | Discharge status: Home / Self Care | Payer: Medicare Other | Attending: Family Medicine | Admitting: Family Medicine

## 2018-07-28 ENCOUNTER — Encounter (HOSPITAL_COMMUNITY): Payer: Self-pay | Admitting: Family Medicine

## 2018-07-28 ENCOUNTER — Other Ambulatory Visit: Payer: Self-pay

## 2018-07-28 DIAGNOSIS — J069 Acute upper respiratory infection, unspecified: Secondary | ICD-10-CM | POA: Diagnosis not present

## 2018-07-28 DIAGNOSIS — H6501 Acute serous otitis media, right ear: Secondary | ICD-10-CM | POA: Diagnosis not present

## 2018-07-28 MED ORDER — HYDROCODONE-HOMATROPINE 5-1.5 MG/5ML PO SYRP: 5.0000 mL | ORAL_SOLUTION | Freq: Four times a day (QID) | ORAL | 0 refills | Status: DC | PRN

## 2018-07-28 MED ORDER — AMOXICILLIN 875 MG PO TABS: 875.0000 mg | ORAL_TABLET | Freq: Two times a day (BID) | ORAL | 0 refills | Status: DC

## 2018-07-28 NOTE — ED Triage Notes (Signed)
Sore throat and losing his voice and cough 3 days.

## 2018-07-28 NOTE — ED Provider Notes (Signed)
[name]    CSN: [id number] Arrival date & time: 07/28/18  1004     History   Chief Complaint Chief Complaint  Patient presents with  . Sore Throat  . Cough    HPI Jose [name] is a 67 y.o. male.   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  He is developed 2 days of sinus congestion and cough along with hoarse voice.  Patient has an ongoing problem with his right ear.  Is also rehabilitating torn quadriceps tendons in both of his legs.  Patient has not had fever or shortness of breath Past Medical History:  Diagnosis Date  . Arthritis    R shoulder, great toes, hands- has gout & has injections in knees with Dr. Estanislado Pandy   . Cancer ([place])    skin ca-   . GERD (gastroesophageal reflux disease)   . History of hiatal hernia   . SDH (subdural hematoma) [facility])     Patient Active Problem List   Diagnosis Date Noted  . Candidiasis   . Abnormality of gait   . Hypoalbuminemia due to protein-calorie malnutrition ([place])   . History of gout   . Sleep disturbance   . Constipation   . Rupture of quadriceps tendon, right, sequela 06/19/2017  . Quadriceps tendon rupture, left, sequela 06/19/2017  . Acute blood loss anemia 06/19/2017  . Fall   . History of subdural hematoma   . Post-operative pain   . Recurrent falls   . Quadriceps tendon rupture 06/15/2017  . Primary osteoarthritis of both knees 02/28/2017  . Primary osteoarthritis of both hands 02/28/2017  . Uricacidemia 02/28/2017  . Pain in joint of left knee 02/07/2017  . Idiopathic chronic gout, unspecified site, without tophus (tophi) 11/29/2016  . HEMATURIA UNSPECIFIED 01/25/2009  . ABDOMINAL PAIN 01/25/2009  . XERODERMA 03/10/2008  . UNS ADVRS EFF UNS RX MEDICINAL&BIOLOGICAL SBSTNC 03/10/2008  . ADJUSTMENT REACTION WITH PHYSICAL SYMPTOMS 02/14/2008  . MUSCLE SPASM 02/14/2008  . ACUTE PROSTATITIS 12/16/2007  . BREAST LUMP OR MASS, RIGHT 07/12/2007  . H/O: RCT (rotator cuff  tear) 01/13/2007  . GOUT 01/08/2007    Past Surgical History:  Procedure Laterality Date  . BRAIN SURGERY  2016   evacuation of SDH  . CARPAL TUNNEL RELEASE Bilateral   . GREEN LIGHT LASER TURP (TRANSURETHRAL RESECTION OF PROSTATE  04/27/2017  . KNEE ARTHROPLASTY    . QUADRICEPS TENDON REPAIR Bilateral 06/15/2017   Procedure: REPAIR QUADRICEP TENDON;  Surgeon: Meredith Pel, MD;  Location: [place];  Service: Orthopedics;  Laterality: Bilateral;  . SHOULDER SURGERY Right        Home Medications    Prior to Admission medications   Medication Sig Start Date End Date Taking? Authorizing Provider  acetaminophen (TYLENOL) 500 MG tablet Take 500-1,000 mg by mouth every 6 (six) hours as needed for mild pain.    [provider]  allopurinol (ZYLOPRIM) 300 MG tablet Take 1 tablet (300 mg total) by mouth daily. 07/09/18   Bo Merino, MD  amoxicillin (AMOXIL) 875 MG tablet Take 1 tablet (875 mg total) by mouth 2 (two) times daily. 07/28/18   Robyn Haber, MD  baclofen (LIORESAL) 10 MG tablet TAKE 1 TABLET BY MOUTH TWICE A DAY Patient taking differently: TAKE 1 TABLET BY MOUTH TWICE A DAY, AS NEEDED 05/14/18   Meredith Pel, MD  colchicine 0.6 MG tablet Take 1 tablet (0.6 mg total) by mouth daily as needed (FOR GOUT FLAREUPS). 07/24/18  Bo Merino, MD  diclofenac sodium (VOLTAREN) 1 % GEL Apply to left knee prn pain daily 07/10/18   Meredith Pel, MD  esomeprazole (NEXIUM) 40 MG capsule Take 1 capsule (40 mg total) by mouth daily. 04/12/17   Robyn Haber, MD  HYDROcodone-homatropine (HYDROMET) 5-1.5 MG/5ML syrup Take 5 mLs by mouth every 6 (six) hours as needed for cough. 07/28/18   Robyn Haber, MD  metoprolol succinate (TOPROL-XL) 25 MG 24 hr tablet TAKE 1 TABLET (25 MG TOTAL) BY MOUTH DAILY. TAKE WITH OR IMMEDIATELY FOLLOWING A MEAL. 03/11/18 07/24/18  Josue Hector, MD  Multiple Vitamin (MULTIVITAMIN WITH MINERALS) TABS tablet Take 1 tablet by mouth  daily.    [provider]    Family History Family History  Problem Relation Age of Onset  . Parkinson's disease Mother   . Heart disease Father   . Parkinson's disease Brother   . Diabetes Brother     Social History Social History   Tobacco Use  . Smoking status: Never Smoker  . Smokeless tobacco: Never Used  Substance Use Topics  . Alcohol use: Yes    Alcohol/week: 8.0 standard drinks    Types: 1 Shots of liquor, 7 Glasses of wine per week    Comment: 2 drinks per day  . Drug use: No     Allergies   Patient has no known allergies.   Review of Systems Review of Systems   Physical Exam Triage Vital Signs ED Triage Vitals  Enc Vitals Group     BP 07/28/18 1019 124/90     Pulse Rate 07/28/18 1019 74     Resp 07/28/18 1019 16     Temp 07/28/18 1019 98.4 F (36.9 C)     Temp src --      SpO2 07/28/18 1019 100 %     Weight 07/28/18 1021 197 lb (89.4 kg)     Height --      Head Circumference --      Peak Flow --      Pain Score 07/28/18 1020 0     Pain Loc --      Pain Edu? --      Excl. in [place]? --    No data found.  Updated Vital Signs BP 124/90   Pulse 74   Temp 98.4 F (36.9 C)   Resp 16   Wt 89.4 kg   SpO2 100%   BMI 26.72 kg/m    Physical Exam  Constitutional: He appears well-developed and well-nourished.  HENT:  Head: Normocephalic and atraumatic.  Right Ear: Hearing and ear canal normal.  Left Ear: Hearing and ear canal normal.  Mouth/Throat: Uvula is midline and oropharynx is clear and moist.  Right eardrum is retracted with thickening and white meniscus Nasal passages shows marked white thick discharge  Eyes: Pupils are equal, round, and reactive to light. EOM are normal.  Neck: Normal range of motion. Neck supple.  Pulmonary/Chest: Effort normal and breath sounds normal.  Skin: Skin is warm and dry.  Nursing note and vitals reviewed.    UC Treatments / Results  Labs (all labs ordered are listed, but only abnormal  results are displayed) Labs Reviewed - No data to display  EKG None  Radiology No results found.  Procedures Procedures (including critical care time)  Medications Ordered in UC Medications - No data to display  Initial Impression / Assessment and Plan / UC Course  I have reviewed the triage vital signs and the nursing  notes.  Pertinent labs & imaging results that were available during my care of the patient were reviewed by me and considered in my medical decision making (see chart for details).     Final Clinical Impressions(s) / UC Diagnoses   Final diagnoses:  Upper respiratory tract infection, unspecified type  Right acute serous otitis media, recurrence not specified   Discharge Instructions   None    ED Prescriptions    Medication Sig Dispense Auth. Provider   amoxicillin (AMOXIL) 875 MG tablet Take 1 tablet (875 mg total) by mouth 2 (two) times daily. 20 tablet Robyn Haber, MD   HYDROcodone-homatropine (HYDROMET) 5-1.5 MG/5ML syrup Take 5 mLs by mouth every 6 (six) hours as needed for cough. 60 mL Robyn Haber, MD     Controlled Substance Prescriptions [employer] Controlled Substance Registry consulted? Not Applicable   Robyn Haber, MD 07/28/18 1037

## 2018-07-29 ENCOUNTER — Encounter: Payer: Self-pay | Admitting: Physical Therapy

## 2018-07-29 ENCOUNTER — Ambulatory Visit: Payer: Medicare Other | Admitting: Physical Therapy

## 2018-07-29 DIAGNOSIS — G8929 Other chronic pain: Secondary | ICD-10-CM

## 2018-07-29 DIAGNOSIS — M25561 Pain in right knee: Secondary | ICD-10-CM

## 2018-07-29 DIAGNOSIS — M62838 Other muscle spasm: Secondary | ICD-10-CM | POA: Diagnosis not present

## 2018-07-29 DIAGNOSIS — R262 Difficulty in walking, not elsewhere classified: Secondary | ICD-10-CM

## 2018-07-29 DIAGNOSIS — R2689 Other abnormalities of gait and mobility: Secondary | ICD-10-CM

## 2018-07-29 DIAGNOSIS — S76111S Strain of right quadriceps muscle, fascia and tendon, sequela: Secondary | ICD-10-CM

## 2018-07-29 DIAGNOSIS — M25562 Pain in left knee: Secondary | ICD-10-CM

## 2018-07-29 DIAGNOSIS — M545 Low back pain, unspecified: Secondary | ICD-10-CM

## 2018-07-29 NOTE — Therapy (Signed)
[place], [place], [address] Phone: [phone]   Fax:  [phone]  Physical Therapy Treatment  Patient Details  Name: Jose [name] MRN: [id number] Date of Birth: 1951-08-12 Referring Provider: Dr Heather Roberts    Encounter Date: 07/29/2018  PT End of Session - 07/29/18 0824    Visit Number  28    Number of Visits  31    Date for PT Re-Evaluation  08/16/18    PT Start Time  0735    PT Stop Time  0813    PT Time Calculation (min)  38 min    Activity Tolerance  Patient tolerated treatment well    Behavior During Therapy  [facility] for tasks assessed/performed       Past Medical History:  Diagnosis Date  . Arthritis    R shoulder, great toes, hands- has gout & has injections in knees with Dr. Estanislado Pandy   . Cancer ([place])    skin ca-   . GERD (gastroesophageal reflux disease)   . History of hiatal hernia   . SDH (subdural hematoma) ([facility])     Past Surgical History:  Procedure Laterality Date  . BRAIN SURGERY  2016   evacuation of SDH  . CARPAL TUNNEL RELEASE Bilateral   . GREEN LIGHT LASER TURP (TRANSURETHRAL RESECTION OF PROSTATE  04/27/2017  . KNEE ARTHROPLASTY    . QUADRICEPS TENDON REPAIR Bilateral 06/15/2017   Procedure: REPAIR QUADRICEP TENDON;  Surgeon: Meredith Pel, MD;  Location: [place];  Service: Orthopedics;  Laterality: Bilateral;  . SHOULDER SURGERY Right     There were no vitals filed for this visit.  Subjective Assessment - 07/29/18 0737    Subjective  I DID NOT SLEEP LAST NIGHT.  Pain flared up after Friday  i saw DAVE AND THEN WENT to get a deep tissue massage.     Currently in Pain?  Yes    Pain Score  2    8/10   Pain Location  Back    Pain Orientation  Left    Pain Descriptors / Indicators  Sharp   stiff,  left low back   Pain Type  Chronic pain    Aggravating Factors   first sitting in the morning,      Pain Relieving Factors  stretches.  moving around     Effect of Pain on Daily Activities  slow in am  today    Multiple Pain Sites  No    Pain Location  Knee                       [facility] Adult PT Treatment/Exercise - 07/29/18 0001      Lumbar Exercises: Stretches   Single Knee to Chest Stretch  5 reps    Lower Trunk Rotation  5 reps;10 seconds    Hip Flexor Stretch  Right;2 reps;20 seconds    Other Lumbar Stretch Exercise  on left side quadratus lumborum stretch AA  to decrease tightness      Lumbar Exercises: Supine   Bent Knee Raise  10 reps   with abdominal bracing   Bridge  10 reps    Other Supine Lumbar Exercises  scissors  L2   HEP after practice      Lumbar Exercises: Quadruped   Single Arm Raise  5 reps   limited motion Lt shoulder,  longstanding   Straight Leg Raise  10 reps   patient declined arm/leg  lifts     Knee/Hip Exercises: Standing   Functional Squat  10 reps    SLS  2 seconds best left ,  right best 15+         Cryotherapy   Number Minutes Cryotherapy  10 Minutes    Cryotherapy Location  Lumbar Spine   knees too   Type of Cryotherapy  --   cold pack            PT Education - 07/29/18 0823    Education provided  Yes    Education Details  HEP    Person(s) Educated  Patient    Methods  Explanation;Verbal cues;Handout    Comprehension  Verbalized understanding;Returned demonstration       PT Short Term Goals - 05/14/18 1551      PT SHORT TERM GOAL #1   Title  Patient will become independent with HEP for core strength and stretching.    Baseline  Patient independent with HEP    Time  3    Period  Weeks    Status  Achieved      PT SHORT TERM GOAL #2   Title  Patient will decrease self reported tenderness in the lumbar paraspinals with palpation.    Baseline  80 % better    Time  3    Period  Weeks    Status  Achieved      PT SHORT TERM GOAL #3   Title  Patient will increase lumbar extensbility by 25%.    Baseline  slight limitation     Time  3    Period  Weeks    Status   On-going      PT SHORT TERM GOAL #4   Title  Patient will demonstrete 4+/5 gross left lower extremity strength     Time  4    Period  Weeks    Status  On-going      PT SHORT TERM GOAL #5   Title  Patient will increase left single leg stance time to 7 seconds     Time  4    Period  Weeks    Status  On-going        PT Long Term Goals - 07/22/18 6256      PT LONG TERM GOAL #1   Title  Patient will demonstrat a 35% limitaion on FOTO.    Time  6    Period  Weeks    Status  Unable to assess      PT LONG TERM GOAL #2   Title  Pt will increase hip flexor strength from a 4/5 to a 5/5 in order to stand at work greater than 30 mins with no pain.    Time  6    Period  Weeks    Status  Unable to assess      PT LONG TERM GOAL #3   Title  Patient will increase core strength in odert o stabilize the lower back to perform daily functional activities.     Baseline  observed increased core control during exercise today    Time  6    Period  Weeks    Status  Partially Met      PT LONG TERM GOAL #4   Title  Patient will stand for 1 hour without increased pain in order to perfrom daily tasks    Baseline  able    Time  8    Period  Weeks  Status  Achieved      PT LONG TERM GOAL #5   Title  Patient will go up/down 8 steps with reciprocol gait pattenr with good control on the left to improve community safety.     Baseline  can go reciprocol with rail/s.  Fair control    Time  8    Period  Weeks    Status  On-going            Plan - 07/29/18 0825    Clinical Impression Statement  pain reduced to 0/10 at end of session.  Patient progressed his HEP today with out increasd pain.    PT Next Visit Plan  review scissors core  stretch strenghten    PT Home Exercise Plan  SLR; bridge, clamshell; standing hip abduction and extension; standing march , single leg bridge, clam,quadriped (on elbows/knees)  Hip flexor stretch,  quad stretch.  straight leg and bent leg lifts, minisquats with  side steps.Multifitus press, press with knee flexion,  press with hip extension.Scissors    Consulted and Agree with Plan of Care  Patient       Patient will benefit from skilled therapeutic intervention in order to improve the following deficits and impairments:     Visit Diagnosis: Chronic right-sided low back pain without sciatica  Other muscle spasm  Difficulty in walking, not elsewhere classified  Acute pain of left knee  Acute pain of right knee  Rupture of quadriceps tendon, right, sequela  Other abnormalities of gait and mobility     Problem List Patient Active Problem List   Diagnosis Date Noted  . Candidiasis   . Abnormality of gait   . Hypoalbuminemia due to protein-calorie malnutrition ([place])   . History of gout   . Sleep disturbance   . Constipation   . Rupture of quadriceps tendon, right, sequela 06/19/2017  . Quadriceps tendon rupture, left, sequela 06/19/2017  . Acute blood loss anemia 06/19/2017  . Fall   . History of subdural hematoma   . Post-operative pain   . Recurrent falls   . Quadriceps tendon rupture 06/15/2017  . Primary osteoarthritis of both knees 02/28/2017  . Primary osteoarthritis of both hands 02/28/2017  . Uricacidemia 02/28/2017  . Pain in joint of left knee 02/07/2017  . Idiopathic chronic gout, unspecified site, without tophus (tophi) 11/29/2016  . HEMATURIA UNSPECIFIED 01/25/2009  . ABDOMINAL PAIN 01/25/2009  . XERODERMA 03/10/2008  . UNS ADVRS EFF UNS RX MEDICINAL&BIOLOGICAL SBSTNC 03/10/2008  . ADJUSTMENT REACTION WITH PHYSICAL SYMPTOMS 02/14/2008  . MUSCLE SPASM 02/14/2008  . ACUTE PROSTATITIS 12/16/2007  . BREAST LUMP OR MASS, RIGHT 07/12/2007  . H/O: RCT (rotator cuff tear) 01/13/2007  . GOUT 01/08/2007    Tyson Parkison  PTA 07/29/2018, 8:32 AM  [facility] [address] [place], [place], [address] Phone: [phone]   Fax:  [phone]  Name: ARVILLE POSTLEWAITE MRN: [id number] Date of Birth: 1951-09-23

## 2018-07-29 NOTE — Patient Instructions (Signed)
Issued Pilates scissors from exercise drawer Level 2 daily 10 x\  1 breath hold

## 2018-08-01 ENCOUNTER — Other Ambulatory Visit: Payer: Self-pay | Admitting: *Deleted

## 2018-08-01 DIAGNOSIS — I712 Thoracic aortic aneurysm, without rupture, unspecified: Secondary | ICD-10-CM

## 2018-08-02 ENCOUNTER — Encounter: Payer: Self-pay | Admitting: Physical Therapy

## 2018-08-02 ENCOUNTER — Ambulatory Visit: Payer: Medicare Other | Admitting: Physical Therapy

## 2018-08-02 DIAGNOSIS — M62838 Other muscle spasm: Secondary | ICD-10-CM

## 2018-08-02 DIAGNOSIS — G8929 Other chronic pain: Secondary | ICD-10-CM

## 2018-08-02 DIAGNOSIS — R262 Difficulty in walking, not elsewhere classified: Secondary | ICD-10-CM

## 2018-08-02 DIAGNOSIS — M545 Low back pain, unspecified: Secondary | ICD-10-CM

## 2018-08-02 DIAGNOSIS — M25561 Pain in right knee: Secondary | ICD-10-CM

## 2018-08-02 DIAGNOSIS — M25562 Pain in left knee: Secondary | ICD-10-CM | POA: Diagnosis not present

## 2018-08-02 DIAGNOSIS — F438 Other reactions to severe stress: Secondary | ICD-10-CM | POA: Diagnosis not present

## 2018-08-02 NOTE — Therapy (Signed)
[place], [place], [address] Phone: [phone]   Fax:  [phone]  Physical Therapy Treatment  Patient Details  Name: Jose [name] MRN: [id number] Date of Birth: [birth year] Referring Provider: Dr Heather Roberts    Encounter Date: 08/02/2018  PT End of Session - 08/02/18 0809    Visit Number  29    Number of Visits  31    Date for PT Re-Evaluation  08/16/18    Authorization Type  [employer] blue shield     PT Start Time  0800    PT Stop Time  0852    PT Time Calculation (min)  52 min    Activity Tolerance  Patient tolerated treatment well    Behavior During Therapy  [facility] for tasks assessed/performed       Past Medical History:  Diagnosis Date  . Arthritis    R shoulder, great toes, hands- has gout & has injections in knees with Dr. Estanislado Pandy   . Cancer ([place])    skin ca-   . GERD (gastroesophageal reflux disease)   . History of hiatal hernia   . SDH (subdural hematoma) (HCC)     Past Surgical History:  Procedure Laterality Date  . BRAIN SURGERY  2016   evacuation of SDH  . CARPAL TUNNEL RELEASE Bilateral   . GREEN LIGHT LASER TURP (TRANSURETHRAL RESECTION OF PROSTATE  04/27/2017  . KNEE ARTHROPLASTY    . QUADRICEPS TENDON REPAIR Bilateral 06/15/2017   Procedure: REPAIR QUADRICEP TENDON;  Surgeon: Meredith Pel, MD;  Location: [place];  Service: Orthopedics;  Laterality: Bilateral;  . SHOULDER SURGERY Right     There were no vitals filed for this visit.  Subjective Assessment - 08/02/18 0759    Subjective  Patient reports no pain today. He feels like he over did it last Friday. he is going to cut out th gym today.     Limitations  Lifting    How long can you stand comfortably?  <25mns     Diagnostic tests  noting post op    Currently in Pain?  No/denies                       [facility] - 08[facility]02[facility]18 0001      Lumbar Exercises: Stretches    Passive Hamstring Stretch  3 reps;20 seconds    Piriformis Stretch  20 seconds;3 reps      Lumbar Exercises: Supine   Other Supine Lumbar Exercises  SLR 2x20 each leg     Other Supine Lumbar Exercises  clam shell x20 bloack; bridge wwith iso black x20       Knee/Hip Exercises: Aerobic   Nustep  8 minutes L5  LE/UE      Knee/Hip Exercises: Machines for Strengthening   Cybex Leg Press  60lb 2x20       Knee/Hip Exercises: Standing   Forward Step Up Limitations  4 ich stpe and hold x15 with right could not do left     SLS  20 sec 3x each leg     Other Standing Knee Exercises  scap retraction while squatting blue x20; shoulder extension with squat x20      Cryotherapy   Number Minutes Cryotherapy  10 Minutes    Cryotherapy Location  Lumbar Spine   knees too            PT Education - 08/02/18 07342  Education provided  Yes    Education Details  reviewed HEP    Person(s) Educated  Patient    Methods  Explanation;Demonstration;Tactile cues;Verbal cues    Comprehension  Verbalized understanding;Returned demonstration;Verbal cues required;Tactile cues required;Need further instruction       PT Short Term Goals - 05/14/18 1551      PT SHORT TERM GOAL #1   Title  Patient will become independent with HEP for core strength and stretching.    Baseline  Patient independent with HEP    Time  3    Period  Weeks    Status  Achieved      PT SHORT TERM GOAL #2   Title  Patient will decrease self reported tenderness in the lumbar paraspinals with palpation.    Baseline  80 % better    Time  3    Period  Weeks    Status  Achieved      PT SHORT TERM GOAL #3   Title  Patient will increase lumbar extensbility by 25%.    Baseline  slight limitation     Time  3    Period  Weeks    Status  On-going      PT SHORT TERM GOAL #4   Title  Patient will demonstrete 4+/5 gross left lower extremity strength     Time  4    Period  Weeks    Status  On-going      PT SHORT TERM GOAL #5    Title  Patient will increase left single leg stance time to 7 seconds     Time  4    Period  Weeks    Status  On-going        PT Long Term Goals - 07/22/18 2202      PT LONG TERM GOAL #1   Title  Patient will demonstrat a 35% limitaion on FOTO.    Time  6    Period  Weeks    Status  Unable to assess      PT LONG TERM GOAL #2   Title  Pt will increase hip flexor strength from a 4/5 to a 5/5 in order to stand at work greater than 30 mins with no pain.    Time  6    Period  Weeks    Status  Unable to assess      PT LONG TERM GOAL #3   Title  Patient will increase core strength in odert o stabilize the lower back to perform daily functional activities.     Baseline  observed increased core control during exercise today    Time  6    Period  Weeks    Status  Partially Met      PT LONG TERM GOAL #4   Title  Patient will stand for 1 hour without increased pain in order to perfrom daily tasks    Baseline  able    Time  8    Period  Weeks    Status  Achieved      PT LONG TERM GOAL #5   Title  Patient will go up/down 8 steps with reciprocol gait pattenr with good control on the left to improve community safety.     Baseline  can go reciprocol with rail/s.  Fair control    Time  8    Period  Weeks    Status  On-going  Plan - 08/02/18 1145    Clinical Impression Statement  Patient tolerated treatment well his left leg was fatigued after treatment. he had minor soreness in his back.     Clinical Presentation  Evolving    Clinical Decision Making  Moderate    Rehab Potential  Good    PT Frequency  2x / week    PT Duration  4 weeks    PT Treatment/Interventions  ADLs/Self Care Home Management;Cryotherapy;Electrical Stimulation;Moist Heat;Traction;Ultrasound;Gait Scientist, forensic;Therapeutic activities;Therapeutic exercise;Patient/family education;Manual techniques;Passive range of motion;Dry needling;Taping    PT Next Visit Plan  review scissors core   stretch strenghten    PT Home Exercise Plan  SLR; bridge, clamshell; standing hip abduction and extension; standing march , single leg bridge, clam,quadriped (on elbows/knees)  Hip flexor stretch,  quad stretch.  straight leg and bent leg lifts, minisquats with side steps.Multifitus press, press with knee flexion,  press with hip extension.Scissors    Consulted and Agree with Plan of Care  Patient       Patient will benefit from skilled therapeutic intervention in order to improve the following deficits and impairments:  Abnormal gait, Pain, Increased muscle spasms, Decreased activity tolerance, Decreased endurance, Decreased strength  Visit Diagnosis: Chronic right-sided low back pain without sciatica  Other muscle spasm  Difficulty in walking, not elsewhere classified  Acute pain of left knee  Acute pain of right knee     Problem List Patient Active Problem List   Diagnosis Date Noted  . Candidiasis   . Abnormality of gait   . Hypoalbuminemia due to protein-calorie malnutrition ([place])   . History of gout   . Sleep disturbance   . Constipation   . Rupture of quadriceps tendon, right, sequela 06/19/2017  . Quadriceps tendon rupture, left, sequela 06/19/2017  . Acute blood loss anemia 06/19/2017  . Fall   . History of subdural hematoma   . Post-operative pain   . Recurrent falls   . Quadriceps tendon rupture 06/15/2017  . Primary osteoarthritis of both knees 02/28/2017  . Primary osteoarthritis of both hands 02/28/2017  . Uricacidemia 02/28/2017  . Pain in joint of left knee 02/07/2017  . Idiopathic chronic gout, unspecified site, without tophus (tophi) 11/29/2016  . HEMATURIA UNSPECIFIED 01/25/2009  . ABDOMINAL PAIN 01/25/2009  . XERODERMA 03/10/2008  . UNS ADVRS EFF UNS RX MEDICINAL&BIOLOGICAL SBSTNC 03/10/2008  . ADJUSTMENT REACTION WITH PHYSICAL SYMPTOMS 02/14/2008  . MUSCLE SPASM 02/14/2008  . ACUTE PROSTATITIS 12/16/2007  . BREAST LUMP OR MASS, RIGHT 07/12/2007   . H/O: RCT (rotator cuff tear) 01/13/2007  . GOUT 01/08/2007    Carney Living PT DPT  08/02/2018, 11:54 AM  [facility] [address] [place], [place], [address] Phone: [phone]   Fax:  [phone]  Name: LELAN CUSH MRN: [id number] Date of Birth: [birth year]

## 2018-08-05 ENCOUNTER — Ambulatory Visit: Payer: Medicare Other | Admitting: Physical Therapy

## 2018-08-05 NOTE — Progress Notes (Signed)
Office Visit Note  Patient: Jose [name]             Date of Birth: 08/28/1951           MRN: [id number]             PCP: [name], MD Referring: [name], MD Visit Date: 08/06/2018 Occupation: @GUAROCC @  Subjective:  Left thumb pain   History of Present Illness: Jose [name] is a 67 y.o. male with history of gout.  He takes allopurinol 300 mg po daily  and colchicine 0.6 mg po PRN for management of gout.  He denies any recent gout flares.  He does not need any refills at this time.  He continues to have locking of the left thumb distally to where he was experiencing pain and locking.  He had a trigger thumb cortisone injection on 07/24/18 that provided good relief. He has been experiencing locking symptoms in his right fifth finger over the past 1 week.  He denies any joint swelling.     Activities of Daily Living:  Patient reports morning stiffness for 2 minutes.   Patient Denies nocturnal pain.  Difficulty dressing/grooming: Denies Difficulty climbing stairs: Reports Difficulty getting out of chair: Denies Difficulty using hands for taps, buttons, cutlery, and/or writing: Denies  Review of Systems  Constitutional: Negative for fatigue and night sweats.  HENT: Negative for mouth sores, trouble swallowing, trouble swallowing, mouth dryness and nose dryness.   Eyes: Negative for redness, visual disturbance and dryness.  Respiratory: Negative for cough, hemoptysis, shortness of breath and difficulty breathing.   Cardiovascular: Negative for chest pain, palpitations, hypertension, irregular heartbeat and swelling in legs/feet.  Gastrointestinal: Negative for blood in stool, constipation and diarrhea.  Endocrine: Negative for increased urination.  Genitourinary: Negative for painful urination.  Musculoskeletal: Positive for arthralgias and joint pain. Negative for joint swelling, myalgias, muscle weakness, morning stiffness, muscle tenderness and myalgias.  Skin:  Negative for color change, rash, hair loss, nodules/bumps, skin tightness, ulcers and sensitivity to sunlight.  Allergic/Immunologic: Negative for susceptible to infections.  Neurological: Negative for dizziness, fainting, memory loss, night sweats and weakness.  Hematological: Negative for swollen glands.  Psychiatric/Behavioral: Negative for depressed mood and sleep disturbance. The patient is not nervous/anxious.     PMFS History:  Patient Active Problem List   Diagnosis Date Noted  . Candidiasis   . Abnormality of gait   . Hypoalbuminemia due to protein-calorie malnutrition ([place])   . History of gout   . Sleep disturbance   . Constipation   . Rupture of quadriceps tendon, right, sequela 06/19/2017  . Quadriceps tendon rupture, left, sequela 06/19/2017  . Acute blood loss anemia 06/19/2017  . Fall   . History of subdural hematoma   . Post-operative pain   . Recurrent falls   . Quadriceps tendon rupture 06/15/2017  . Primary osteoarthritis of both knees 02/28/2017  . Primary osteoarthritis of both hands 02/28/2017  . Uricacidemia 02/28/2017  . Pain in joint of left knee 02/07/2017  . Idiopathic chronic gout, unspecified site, without tophus (tophi) 11/29/2016  . HEMATURIA UNSPECIFIED 01/25/2009  . ABDOMINAL PAIN 01/25/2009  . XERODERMA 03/10/2008  . UNS ADVRS EFF UNS RX MEDICINAL&BIOLOGICAL SBSTNC 03/10/2008  . ADJUSTMENT REACTION WITH PHYSICAL SYMPTOMS 02/14/2008  . MUSCLE SPASM 02/14/2008  . ACUTE PROSTATITIS 12/16/2007  . BREAST LUMP OR MASS, RIGHT 07/12/2007  . H/O: RCT (rotator cuff tear) 01/13/2007  . GOUT 01/08/2007    Past Medical History:  Diagnosis Date  .  Arthritis    R shoulder, great toes, hands- has gout & has injections in knees with Dr. Estanislado Pandy   . Cancer ([place])    skin ca-   . GERD (gastroesophageal reflux disease)   . History of hiatal hernia   . SDH (subdural hematoma) (HCC)     Family History  Problem Relation Age of Onset  . Parkinson's  disease Mother   . Heart disease Father   . Parkinson's disease Brother   . Diabetes Brother    Past Surgical History:  Procedure Laterality Date  . BRAIN SURGERY  2016   evacuation of SDH  . CARPAL TUNNEL RELEASE Bilateral   . GREEN LIGHT LASER TURP (TRANSURETHRAL RESECTION OF PROSTATE  04/27/2017  . KNEE ARTHROPLASTY    . QUADRICEPS TENDON REPAIR Bilateral 06/15/2017   Procedure: REPAIR QUADRICEP TENDON;  Surgeon: Meredith Pel, MD;  Location: [place];  Service: Orthopedics;  Laterality: Bilateral;  . SHOULDER SURGERY Right    Social History   Social History Narrative  . Not on file    Objective: Vital Signs: BP 122/78 (BP Location: Left Arm, Patient Position: Sitting, Cuff Size: Normal)   Pulse 82   Resp 15   Ht 6' (1.829 m)   Wt 203 lb 6.4 oz (92.3 kg)   BMI 27.59 kg/m    Physical Exam  Constitutional: He is oriented to person, place, and time. He appears well-developed and well-nourished.  HENT:  Head: Normocephalic and atraumatic.  Eyes: Pupils are equal, round, and reactive to light. Conjunctivae and EOM are normal.  Neck: Normal range of motion. Neck supple.  Cardiovascular: Normal rate, regular rhythm and normal heart sounds.  Pulmonary/Chest: Effort normal and breath sounds normal.  Abdominal: Soft. Bowel sounds are normal.  Lymphadenopathy:    He has no cervical adenopathy.  Neurological: He is alert and oriented to person, place, and time.  Skin: Skin is warm and dry. Capillary refill takes less than 2 seconds.  Psychiatric: He has a normal mood and affect. His behavior is normal.  Nursing note and vitals reviewed.    Musculoskeletal Exam: Shoulder joints elbow joints wrist joints are good range of motion.  He has DIP and PIP thickening in his hands consistent with osteoarthritis.  He had thickening of left thumb flexor tendon and right fifth finger flexor tendon.  All other joints are full range of motion with no synovitis.  CDAI Exam: CDAI Score:  Not documented Patient Global Assessment: Not documented; Provider Global Assessment: Not documented Swollen: Not documented; Tender: Not documented Joint Exam   Not documented   There is currently no information documented on the homunculus. Go to the Rheumatology activity and complete the homunculus joint exam.  Investigation: No additional findings.  Imaging: No results found.  Recent Labs: Lab Results  Component Value Date   WBC 6.5 03/29/2018   HGB 13.9 03/29/2018   PLT 193 03/29/2018   NA 144 03/29/2018   K 4.7 03/29/2018   CL 108 03/29/2018   CO2 31 03/29/2018   GLUCOSE 118 (H) 03/29/2018   BUN 19 03/29/2018   CREATININE 1.01 03/29/2018   BILITOT 0.4 03/29/2018   ALKPHOS 48 06/20/2017   AST 18 03/29/2018   ALT 24 03/29/2018   PROT 5.9 (L) 03/29/2018   ALBUMIN 2.6 (L) 06/20/2017   CALCIUM 9.4 03/29/2018   GFRAA 88 03/29/2018    Speciality Comments: No specialty comments available.  Procedures:  Hand/UE Inj: L thumb A2 for trigger finger on 08/06/2018 2:08 PM  Indications: pain, tendon swelling and therapeutic Details: 27 G needle, ultrasound-guided volar approach Medications: 0.5 mL lidocaine 1 %; 10 mg triamcinolone acetonide 40 MG/ML Aspirate: 0 mL Procedure, treatment alternatives, risks and benefits explained, specific risks discussed. Immediately prior to procedure a time out was called to verify the correct patient, procedure, equipment, support staff and site/side marked as required. Patient was prepped and draped in the usual sterile fashion.   Hand/UE Inj: L small A1 for trigger finger on 08/06/2018 2:19 PM Indications: pain, tendon swelling and therapeutic Details: 27 G needle, ultrasound-guided volar approach Medications: 0.5 mL lidocaine 1 %; 10 mg triamcinolone acetonide 40 MG/ML Aspirate: 0 mL Procedure, treatment alternatives, risks and benefits explained, specific risks discussed. Immediately prior to procedure a time out was called to verify  the correct patient, procedure, equipment, support staff and site/side marked as required. Patient was prepped and draped in the usual sterile fashion.     Allergies: Patient has no known allergies.   Assessment / Plan:     Visit Diagnoses: Idiopathic chronic gout of multiple sites without tophus - He has not had any recent gout flares.  He takes Allopurinol 300 mg po daily and colchicine 0.6 mg prn.  He does not need any refills.   Uric acid was 4.8 on 03/29/18.   Primary osteoarthritis of both hands: He has PIP and DIP synovial thickening consistent with osteoarthritis of both hands.  Joint protection and muscle strengthening consistent with osteoarthritis.   Primary osteoarthritis of both knees: He has no warmth or effusion. He follows up with Dr. Marlou Sa.   Trigger thumb, left thumb -He has thickening of left thumb flexor tendon.  He requested a cortisone injection today in the office.  He tolerated it well.  Splint was applied.  He can apply Voltaren gel topically.    Plan: Hand/UE Inj: L thumb A2  Trigger little finger of right hand -He has thickening of the right fifth finger flexor tendon. He requested a cortisone injection today.  He tolerated the procedure well. A splint was applied in the office today.  He has voltaren gel, which he can apply topically.  Plan: Hand/UE Inj: L small A1  Other medical conditions are listed as follows:   Subdural hematoma (HCC)  [place]  Neuropathy  Rupture of quadriceps tendon, right, sequela: Dr. Marlou Sa  Quadriceps tendon rupture, left, sequela: Dr. Marlou Sa   Medication monitoring encounter   Orders: Orders Placed This Encounter  Procedures  . Hand/UE Inj: L thumb A2  . Hand/UE Inj: L small A1   No orders of the defined types were placed in this encounter.    Follow-Up Instructions: Return in about 6 months (around 02/06/2019) for Gout.   Ofilia Neas, PA-C   I examined and evaluated the patient with Hazel Sams PA.  Patient had trigger  fingers as described above on examination today.  After informed consent was obtained the cortisone injections were given.  Postinjection precautions were discussed.  The plan of care was discussed as noted above.  Bo Merino, MD  Note - This record has been created using Editor, commissioning.  Chart creation errors have been sought, but may not always  have been located. Such creation errors do not reflect on  the standard of medical care.

## 2018-08-06 ENCOUNTER — Ambulatory Visit (INDEPENDENT_AMBULATORY_CARE_PROVIDER_SITE_OTHER): Payer: Medicare Other | Admitting: Rheumatology

## 2018-08-06 ENCOUNTER — Encounter: Payer: Self-pay | Admitting: Rheumatology

## 2018-08-06 VITALS — BP 122/78 | HR 82 | Resp 15 | Ht 72.0 in | Wt 203.4 lb

## 2018-08-06 DIAGNOSIS — M1A09X0 Idiopathic chronic gout, multiple sites, without tophus (tophi): Secondary | ICD-10-CM

## 2018-08-06 DIAGNOSIS — M65312 Trigger thumb, left thumb: Secondary | ICD-10-CM

## 2018-08-06 DIAGNOSIS — M65351 Trigger finger, right little finger: Secondary | ICD-10-CM

## 2018-08-06 DIAGNOSIS — S065X9A Traumatic subdural hemorrhage with loss of consciousness of unspecified duration, initial encounter: Secondary | ICD-10-CM

## 2018-08-06 DIAGNOSIS — A809 Acute poliomyelitis, unspecified: Secondary | ICD-10-CM | POA: Diagnosis not present

## 2018-08-06 DIAGNOSIS — Z5181 Encounter for therapeutic drug level monitoring: Secondary | ICD-10-CM

## 2018-08-06 DIAGNOSIS — M19041 Primary osteoarthritis, right hand: Secondary | ICD-10-CM

## 2018-08-06 DIAGNOSIS — S76112S Strain of left quadriceps muscle, fascia and tendon, sequela: Secondary | ICD-10-CM

## 2018-08-06 DIAGNOSIS — M19042 Primary osteoarthritis, left hand: Secondary | ICD-10-CM | POA: Diagnosis not present

## 2018-08-06 DIAGNOSIS — S76111S Strain of right quadriceps muscle, fascia and tendon, sequela: Secondary | ICD-10-CM | POA: Diagnosis not present

## 2018-08-06 DIAGNOSIS — G629 Polyneuropathy, unspecified: Secondary | ICD-10-CM | POA: Diagnosis not present

## 2018-08-06 DIAGNOSIS — M170 Bilateral primary osteoarthritis of knee: Secondary | ICD-10-CM

## 2018-08-06 DIAGNOSIS — S065XAA Traumatic subdural hemorrhage with loss of consciousness status unknown, initial encounter: Secondary | ICD-10-CM

## 2018-08-06 MED ORDER — TRIAMCINOLONE ACETONIDE 40 MG/ML IJ SUSP
10.0000 mg | INTRAMUSCULAR | Status: AC | PRN
  Administered 2018-08-06: 10 mg

## 2018-08-06 MED ORDER — LIDOCAINE HCL 1 % IJ SOLN
0.5000 mL | INTRAMUSCULAR | Status: AC | PRN
  Administered 2018-08-06: .5 mL

## 2018-08-07 ENCOUNTER — Ambulatory Visit: Payer: Medicare Other | Admitting: Physical Therapy

## 2018-08-07 ENCOUNTER — Encounter: Payer: Self-pay | Admitting: Physical Therapy

## 2018-08-07 DIAGNOSIS — M62838 Other muscle spasm: Secondary | ICD-10-CM

## 2018-08-07 DIAGNOSIS — M545 Low back pain, unspecified: Secondary | ICD-10-CM

## 2018-08-07 DIAGNOSIS — M25562 Pain in left knee: Secondary | ICD-10-CM | POA: Diagnosis not present

## 2018-08-07 DIAGNOSIS — R262 Difficulty in walking, not elsewhere classified: Secondary | ICD-10-CM | POA: Diagnosis not present

## 2018-08-07 DIAGNOSIS — G8929 Other chronic pain: Secondary | ICD-10-CM | POA: Diagnosis not present

## 2018-08-07 DIAGNOSIS — M25561 Pain in right knee: Secondary | ICD-10-CM | POA: Diagnosis not present

## 2018-08-07 NOTE — Therapy (Signed)
[place], [place], [address] Phone: [phone]   Fax:  [phone]  Physical Therapy Treatment  Patient Details  Name: Jose [name] MRN: [id number]  Date of Birth: 09-05-1951 Referring Provider: Dr Heather Roberts    Encounter Date: 08/07/2018  PT End of Session - 08/07/18 0920    Visit Number  30    Number of Visits  31    Date for PT Re-Evaluation  08/16/18    Authorization Type  [employer] cross blue shield     PT Start Time  0845    PT Stop Time  0935    PT Time Calculation (min)  50 min    Activity Tolerance  Patient tolerated treatment well    Behavior During Therapy  [facility] for tasks assessed/performed       Past Medical History:  Diagnosis Date  . Arthritis    R shoulder, great toes, hands- has gout & has injections in knees with Dr. Estanislado Pandy   . Cancer ([place])    skin ca-   . GERD (gastroesophageal reflux disease)   . History of hiatal hernia   . SDH (subdural hematoma) ([facility])     Past Surgical History:  Procedure Laterality Date  . BRAIN SURGERY  2016   evacuation of SDH  . CARPAL TUNNEL RELEASE Bilateral   . GREEN LIGHT LASER TURP (TRANSURETHRAL RESECTION OF PROSTATE  04/27/2017  . KNEE ARTHROPLASTY    . QUADRICEPS TENDON REPAIR Bilateral 06/15/2017   Procedure: REPAIR QUADRICEP TENDON;  Surgeon: Meredith Pel, MD;  Location: [place];  Service: Orthopedics;  Laterality: Bilateral;  . SHOULDER SURGERY Right     There were no vitals filed for this visit.  Subjective Assessment - 08/07/18 0852    Subjective  Patient has been workijng hard at the gym. He still feels like the left leg will give out at times. He will be going to [place] next week to have his knee looked at.     How long can you stand comfortably?  <31mns     Diagnostic tests  noting post op    Currently in Pain?  No/denies         [facility]      AROM   Lumbar Flexion  25% limited  without pain     Lumbar Extension  25% limited without pain     Lumbar - Right Side Bend  No increased pain       Strength   Right Hip Flexion  5/5    Left Hip Flexion  4/5    Left Hip ABduction  5/5    Right Knee Flexion  5/5    Right Knee Extension  5/5    Left Knee Extension  4/5                   [facility] Adult PT Treatment/Exercise - 08/07/18 0001      Lumbar Exercises: Stretches   Passive Hamstring Stretch  3 reps;20 seconds    Piriformis Stretch  20 seconds;3 reps      Lumbar Exercises: Supine   Bent Knee Raise  20 reps   with abdominal bracing   Bridge  20 reps    Other Supine Lumbar Exercises  SLR 2x20 each leg     Other Supine Lumbar Exercises  clam shell x20 bloack; bridge wwith iso black x20       Knee/Hip Exercises: Aerobic  Nustep  8 minutes L5  LE/UE      Knee/Hip Exercises: Machines for Strengthening   Cybex Leg Press  60lb 2x20       Knee/Hip Exercises: Standing   Forward Step Up Limitations  --    SLS  20 sec 3x each leg     Other Standing Knee Exercises  scap retraction while squatting blue x20; shoulder extension with squat x20    Other Standing Knee Exercises  step onto air-ex 2x10       Cryotherapy   Cryotherapy Location  Lumbar Spine   knees too            PT Education - 08/07/18 0855    Education provided  Yes    Education Details  reviewed techniuqe with ther-ex     Person(s) Educated  Patient    Methods  Explanation;Demonstration;Tactile cues;Verbal cues    Comprehension  Verbalized understanding;Returned demonstration;Tactile cues required;Verbal cues required       PT Short Term Goals - 05/14/18 1551      PT SHORT TERM GOAL #1   Title  Patient will become independent with HEP for core strength and stretching.    Baseline  Patient independent with HEP    Time  3    Period  Weeks    Status  Achieved      PT SHORT TERM GOAL #2   Title  Patient will decrease self reported tenderness in the lumbar paraspinals with  palpation.    Baseline  80 % better    Time  3    Period  Weeks    Status  Achieved      PT SHORT TERM GOAL #3   Title  Patient will increase lumbar extensbility by 25%.    Baseline  slight limitation     Time  3    Period  Weeks    Status  On-going      PT SHORT TERM GOAL #4   Title  Patient will demonstrete 4+/5 gross left lower extremity strength     Time  4    Period  Weeks    Status  On-going      PT SHORT TERM GOAL #5   Title  Patient will increase left single leg stance time to 7 seconds     Time  4    Period  Weeks    Status  On-going        PT Long Term Goals - 08/07/18 4462      PT LONG TERM GOAL #1   Title  Patient will demonstrat a 35% limitaion on FOTO.    Baseline  not assessed today     Time  6    Period  Weeks    Status  On-going      PT LONG TERM GOAL #2   Title  Pt will increase hip flexor strength from a 4/5 to a 5/5 in order to stand at work greater than 30 mins with no pain.    Baseline  4+/5 left hip flexor and knee extension     Time  6    Period  Weeks    Status  Achieved      PT LONG TERM GOAL #3   Title  Patient will increase core strength in odert o stabilize the lower back to perform daily functional activities.     Baseline  observed increased core control during exercise today    Time  6  Period  Weeks    Status  Achieved      PT LONG TERM GOAL #4   Title  Patient will stand for 1 hour without increased pain in order to perfrom daily tasks    Baseline  able to stand without increased pain. Left knee does buckle at times     Time  8    Period  Weeks    Status  Achieved      PT LONG TERM GOAL #5   Title  Patient will go up/down 8 steps with reciprocol gait pattenr with good control on the left to improve community safety.     Baseline  continues to have some difficulty with the left leg but this may be baseline     Time  8    Period  Weeks    Status  Partially Met            Plan - 08/07/18 0920    Clinical  Impression Statement  Patient is making good progress. His single leg stance on the left has improved to 20 sec. he has been perfroming gym exercises on his own without significant pain. he is going for massages weekly for his lower back. He has 1 more visit left then will discharge to HEP. His strength is still limited slightly on the left but is improving.     Clinical Presentation  Evolving    Clinical Decision Making  Moderate    Rehab Potential  Good    PT Frequency  2x / week    PT Duration  4 weeks    PT Treatment/Interventions  ADLs/Self Care Home Management;Cryotherapy;Electrical Stimulation;Moist Heat;Traction;Ultrasound;Gait Scientist, forensic;Therapeutic activities;Therapeutic exercise;Patient/family education;Manual techniques;Passive range of motion;Dry needling;Taping    PT Next Visit Plan  review scissors core  stretch strenghten    PT Home Exercise Plan  SLR; bridge, clamshell; standing hip abduction and extension; standing march , single leg bridge, clam,quadriped (on elbows/knees)  Hip flexor stretch,  quad stretch.  straight leg and bent leg lifts, minisquats with side steps.Multifitus press, press with knee flexion,  press with hip extension.Scissors    Consulted and Agree with Plan of Care  Patient       Patient will benefit from skilled therapeutic intervention in order to improve the following deficits and impairments:  Abnormal gait, Pain, Increased muscle spasms, Decreased activity tolerance, Decreased endurance, Decreased strength  Visit Diagnosis: Chronic right-sided low back pain without sciatica  Other muscle spasm  Difficulty in walking, not elsewhere classified  Acute pain of left knee  Acute pain of right knee     Problem List Patient Active Problem List   Diagnosis Date Noted  . Candidiasis   . Abnormality of gait   . Hypoalbuminemia due to protein-calorie malnutrition ([place])   . History of gout   . Sleep disturbance   . Constipation   .  Rupture of quadriceps tendon, right, sequela 06/19/2017  . Quadriceps tendon rupture, left, sequela 06/19/2017  . Acute blood loss anemia 06/19/2017  . Fall   . History of subdural hematoma   . Post-operative pain   . Recurrent falls   . Quadriceps tendon rupture 06/15/2017  . Primary osteoarthritis of both knees 02/28/2017  . Primary osteoarthritis of both hands 02/28/2017  . Uricacidemia 02/28/2017  . Pain in joint of left knee 02/07/2017  . Idiopathic chronic gout, unspecified site, without tophus (tophi) 11/29/2016  . HEMATURIA UNSPECIFIED 01/25/2009  . ABDOMINAL PAIN 01/25/2009  . XERODERMA 03/10/2008  .  UNS ADVRS EFF UNS RX MEDICINAL&BIOLOGICAL SBSTNC 03/10/2008  . ADJUSTMENT REACTION WITH PHYSICAL SYMPTOMS 02/14/2008  . MUSCLE SPASM 02/14/2008  . ACUTE PROSTATITIS 12/16/2007  . BREAST LUMP OR MASS, RIGHT 07/12/2007  . H/O: RCT (rotator cuff tear) 01/13/2007  . GOUT 01/08/2007    Carney Living PT DPT  08/07/2018, 1:28 PM  [facility] [address] [place], [place], [address] Phone: [phone]   Fax:  [phone]  Name: Jose [name] MRN: [id number] Date of Birth: 03-24-1951

## 2018-08-09 ENCOUNTER — Ambulatory Visit: Payer: Medicare Other | Admitting: Physical Therapy

## 2018-08-09 ENCOUNTER — Encounter: Payer: Self-pay | Admitting: Physical Therapy

## 2018-08-09 DIAGNOSIS — M25562 Pain in left knee: Secondary | ICD-10-CM

## 2018-08-09 DIAGNOSIS — M545 Low back pain, unspecified: Secondary | ICD-10-CM

## 2018-08-09 DIAGNOSIS — M25561 Pain in right knee: Secondary | ICD-10-CM

## 2018-08-09 DIAGNOSIS — R262 Difficulty in walking, not elsewhere classified: Secondary | ICD-10-CM

## 2018-08-09 DIAGNOSIS — G8929 Other chronic pain: Secondary | ICD-10-CM | POA: Diagnosis not present

## 2018-08-09 DIAGNOSIS — M62838 Other muscle spasm: Secondary | ICD-10-CM

## 2018-08-09 DIAGNOSIS — F438 Other reactions to severe stress: Secondary | ICD-10-CM | POA: Diagnosis not present

## 2018-08-09 NOTE — Therapy (Addendum)
[place], [place], [address] Phone: [phone]   Fax:  [phone]  Physical Therapy Treatment / Discharge   Patient Details  Name: Jose [name] MRN: [id number] Date of Birth: 04/18/1951 Referring Provider: Dr Heather Roberts    Encounter Date: 08/09/2018  PT End of Session - 08/09/18 0841    Visit Number  31    Number of Visits  31    Date for PT Re-Evaluation  08/16/18    Authorization Type  Blue cross blue shield     PT Start Time  0838    PT Stop Time  0934    PT Time Calculation (min)  56 min       Past Medical History:  Diagnosis Date  . Arthritis    R shoulder, great toes, hands- has gout & has injections in knees with Dr. Estanislado Pandy   . Cancer ([place])    skin ca-   . GERD (gastroesophageal reflux disease)   . History of hiatal hernia   . SDH (subdural hematoma) ([facility]CC)     Past Surgical History:  Procedure Laterality Date  . BRAIN SURGERY  2016   evacuation of SDH  . CARPAL TUNNEL RELEASE Bilateral   . GREEN LIGHT LASER TURP (TRANSURETHRAL RESECTION OF PROSTATE  04/27/2017  . KNEE ARTHROPLASTY    . QUADRICEPS TENDON REPAIR Bilateral 06/15/2017   Procedure: REPAIR QUADRICEP TENDON;  Surgeon: Meredith Pel, MD;  Location: [place];  Service: Orthopedics;  Laterality: Bilateral;  . SHOULDER SURGERY Right     There were no vitals filed for this visit.  Subjective Assessment - 08/09/18 0840    Subjective  No pain. Doing stretches and core 2 x per day at home. Going to gym. Pt reports back pain resolved and he is back to his baseline aches and pains.     Currently in Pain?  No/denies         [facility] PT Assessment - 08/09/18 0001      Observation/Other Assessments   Focus on Therapeutic Outcomes (FOTO)   29% limited                    [facility] Adult PT Treatment/Exercise - 08/09/18 0001      Lumbar Exercises: Supine   Bent Knee Raise  20 reps   with abdominal  bracing   Single Leg Bridge  10 reps    Other Supine Lumbar Exercises  SLR 2x20 each leg       Lumbar Exercises: Quadruped   Straight Leg Raise  10 reps   patient declined arm/leg lifts     Knee/Hip Exercises: Aerobic   Nustep  8 minutes L5  LE/UE      Knee/Hip Exercises: Machines for Strengthening   Cybex Leg Press  60lb 2x20       Knee/Hip Exercises: Standing   SLS  9 sec best left 45 sec right     Gait Training  up and down 8 stairs with 1 HR -needs knee brace to prevent buckle per patient     Other Standing Knee Exercises  review of mini squats with lateral steps and squats at counter x 20 each       Cryotherapy   Number Minutes Cryotherapy  10 Minutes    Cryotherapy Location  Lumbar Spine   knees too   Type of Cryotherapy  Ice pack  PT Short Term Goals - 08/09/18 4818      PT SHORT TERM GOAL #1   Title  Patient will become independent with HEP for core strength and stretching.    Time  3    Period  Weeks    Status  Achieved      PT SHORT TERM GOAL #2   Title  Patient will decrease self reported tenderness in the lumbar paraspinals with palpation.    Baseline  80 % better    Time  3    Period  Weeks    Status  Achieved      PT SHORT TERM GOAL #3   Title  Patient will increase lumbar extensbility by 25%.    Baseline  slight limitation     Time  3    Period  Weeks    Status  Partially Met      PT SHORT TERM GOAL #4   Title  Patient will demonstrete 4+/5 gross left lower extremity strength     Time  4    Period  Weeks    Status  Partially Met      PT SHORT TERM GOAL #5   Title  Patient will increase left single leg stance time to 7 seconds     Baseline  9 sec best on left     Time  4    Period  Weeks    Status  Achieved        PT Long Term Goals - 08/09/18 5631      PT LONG TERM GOAL #1   Title  Patient will demonstrat a 35% limitaion on FOTO.    Baseline  29% limited     Time  6    Period  Weeks    Status  Achieved       PT LONG TERM GOAL #2   Title  Pt will increase hip flexor strength from a 4/5 to a 5/5 in order to stand at work greater than 30 mins with no pain.    Baseline  4+/5 left hip flexor and knee extension     Time  6    Period  Weeks    Status  Achieved      PT LONG TERM GOAL #3   Title  Patient will increase core strength in odert o stabilize the lower back to perform daily functional activities.     Baseline  observed increased core control during exercise today    Time  6    Period  Weeks    Status  Achieved      PT LONG TERM GOAL #4   Title  Patient will stand for 1 hour without increased pain in order to perfrom daily tasks    Baseline  able to stand without increased pain. Left knee does buckle at times     Time  8    Period  Weeks    Status  Achieved      PT LONG TERM GOAL #5   Title  Patient will go up/down 8 steps with reciprocol gait pattenr with good control on the left to improve community safety.     Baseline  continues to have some difficulty with the left leg but this may be baseline     Time  8    Period  Weeks    Status  Partially Met            Plan - 08/09/18 4970  Clinical Impression Statement  Pt reports resolvement of back pain since eval. His left knee remains weak and gives away intermittently especially if he is not wearing his knee brace. He is independent with HEP thus far. He has met or partially met all goals. FOTO sore improved to 29% limited.     PT Next Visit Plan  DC to HEP     PT Home Exercise Plan  SLR; bridge, clamshell; standing hip abduction and extension; standing march , single leg bridge, clam,quadriped (on elbows/knees)  Hip flexor stretch,  quad stretch.  straight leg and bent leg lifts, minisquats with side steps.Multifitus press, press with knee flexion,  press with hip extension.Scissors    Consulted and Agree with Plan of Care  Patient       Patient will benefit from skilled therapeutic intervention in order to improve the  following deficits and impairments:  Abnormal gait, Pain, Increased muscle spasms, Decreased activity tolerance, Decreased endurance, Decreased strength  Visit Diagnosis: Chronic right-sided low back pain without sciatica  Other muscle spasm  Difficulty in walking, not elsewhere classified  Acute pain of left knee  Acute pain of right knee  PHYSICAL THERAPY DISCHARGE SUMMARY  Visits from Start of Care:31  Current functional level related to goals / functional outcomes: Continued pain but improved ability yo perform activity    Remaining deficits: Pain at times    Education / Equipment: HEP   Plan: Patient agrees to discharge.  Patient goals were partially met. Patient is being discharged due to being pleased with the current functional level.  ?????       Problem List Patient Active Problem List   Diagnosis Date Noted  . Candidiasis   . Abnormality of gait   . Hypoalbuminemia due to protein-calorie malnutrition ([place])   . History of gout   . Sleep disturbance   . Constipation   . Rupture of quadriceps tendon, right, sequela 06/19/2017  . Quadriceps tendon rupture, left, sequela 06/19/2017  . Acute blood loss anemia 06/19/2017  . Fall   . History of subdural hematoma   . Post-operative pain   . Recurrent falls   . Quadriceps tendon rupture 06/15/2017  . Primary osteoarthritis of both knees 02/28/2017  . Primary osteoarthritis of both hands 02/28/2017  . Uricacidemia 02/28/2017  . Pain in joint of left knee 02/07/2017  . Idiopathic chronic gout, unspecified site, without tophus (tophi) 11/29/2016  . HEMATURIA UNSPECIFIED 01/25/2009  . ABDOMINAL PAIN 01/25/2009  . XERODERMA 03/10/2008  . UNS ADVRS EFF UNS RX MEDICINAL&BIOLOGICAL SBSTNC 03/10/2008  . ADJUSTMENT REACTION WITH PHYSICAL SYMPTOMS 02/14/2008  . MUSCLE SPASM 02/14/2008  . ACUTE PROSTATITIS 12/16/2007  . BREAST LUMP OR MASS, RIGHT 07/12/2007  . H/O: RCT (rotator cuff tear) 01/13/2007  . GOUT  01/08/2007   Carolyne Littles PT DPT  10/15/2018   Dorene Ar ,PTA  08/09/2018, 9:32 AM     [facility] [address] [place], [place], [address] Phone: [phone]   Fax:  [phone]  Name: Jose [name] MRN: [id number] Date of Birth: [birth year]

## 2018-08-16 DIAGNOSIS — F438 Other reactions to severe stress: Secondary | ICD-10-CM | POA: Diagnosis not present

## 2018-08-23 DIAGNOSIS — F438 Other reactions to severe stress: Secondary | ICD-10-CM | POA: Diagnosis not present

## 2018-08-27 DIAGNOSIS — F438 Other reactions to severe stress: Secondary | ICD-10-CM | POA: Diagnosis not present

## 2018-09-02 DIAGNOSIS — Z4789 Encounter for other orthopedic aftercare: Secondary | ICD-10-CM | POA: Diagnosis not present

## 2018-09-02 DIAGNOSIS — N400 Enlarged prostate without lower urinary tract symptoms: Secondary | ICD-10-CM | POA: Diagnosis not present

## 2018-09-02 DIAGNOSIS — R828 Abnormal findings on cytological and histological examination of urine: Secondary | ICD-10-CM | POA: Diagnosis not present

## 2018-09-02 DIAGNOSIS — R310 Gross hematuria: Secondary | ICD-10-CM | POA: Diagnosis not present

## 2018-09-03 DIAGNOSIS — F438 Other reactions to severe stress: Secondary | ICD-10-CM | POA: Diagnosis not present

## 2018-09-10 IMAGING — MR MR KNEE*L* W/O CM
4 of 6 series · 28 of 40 positions shown · non-contrast
Comparison: None.

CLINICAL DATA: Left knee pain since a slip and [place] 6 weeks ago.

EXAM:
MRI OF THE LEFT KNEE WITHOUT CONTRAST
TECHNIQUE: Multiplanar, multisequence MR imaging of the knee was performed. No
intravenous contrast was administered.

[Series 2: PD · axial · 4.0mm · 0.36mm/px · z∈[-81,+33]mm · 8 of 24 slices shown (1 of 2)]
[im 1/24]
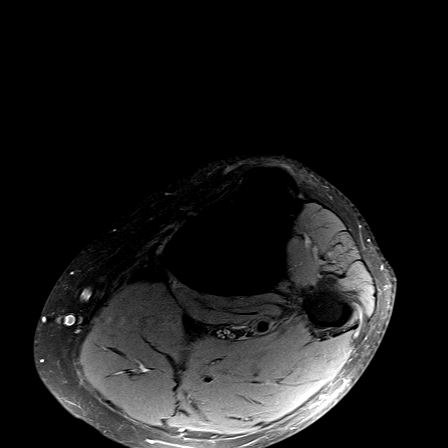
[im 4/24]
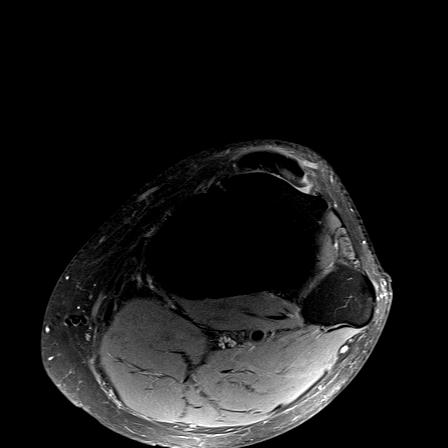
[im 7/24]
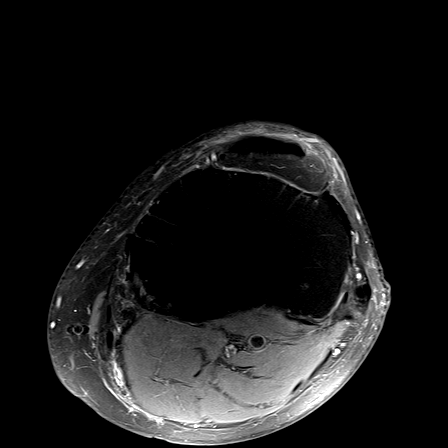
[im 10/24]
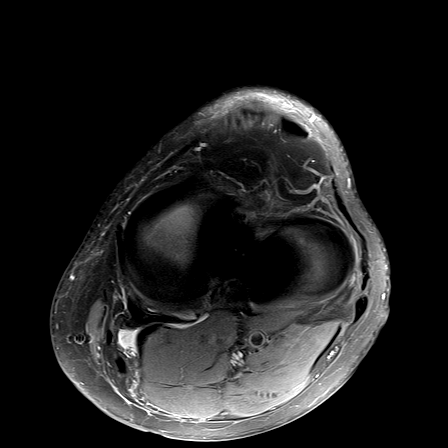
[im 14/24]
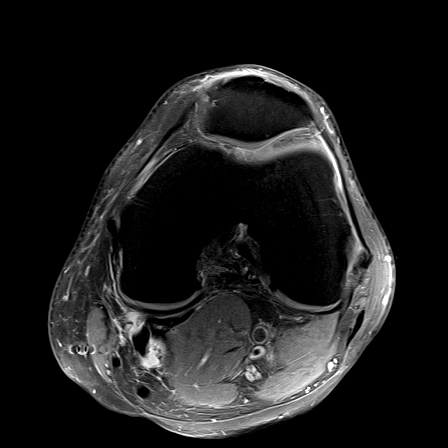
[im 17/24]
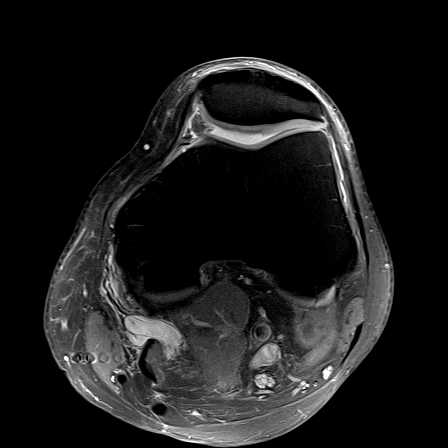
[im 20/24]
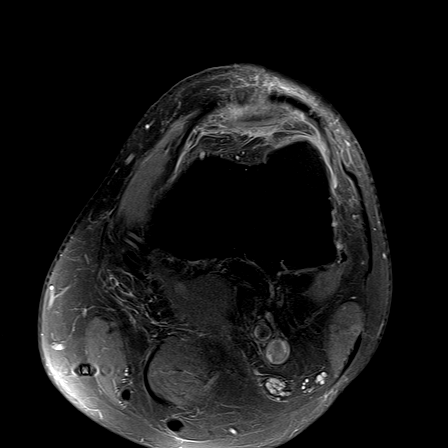
[im 24/24]
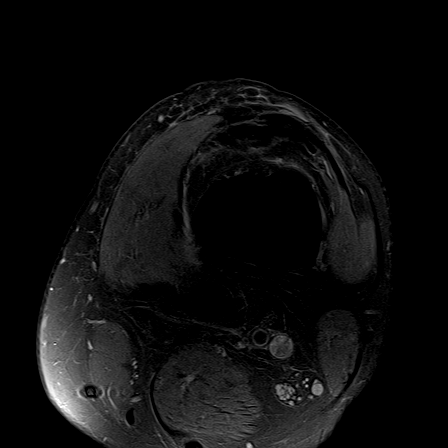

[Series 4: PD fat-sat · sagittal · 4.0mm · 0.38mm/px · 7 of 24 slices shown (1 of 2)]
[im 1/24]
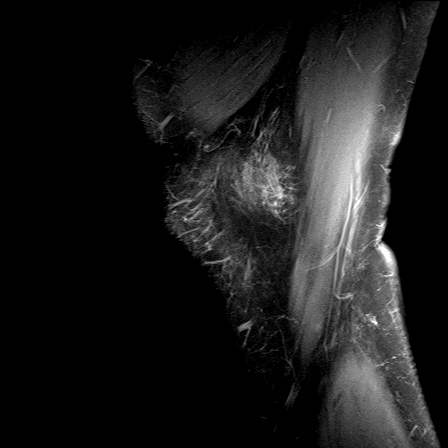
[im 4/24]
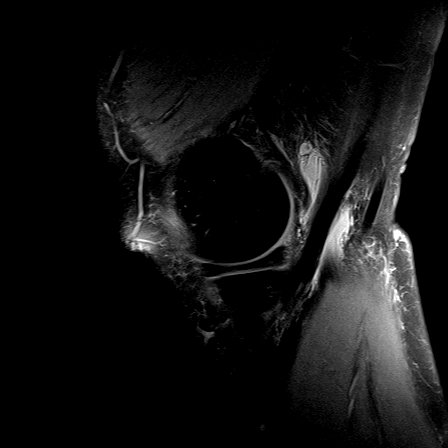
[im 8/24]
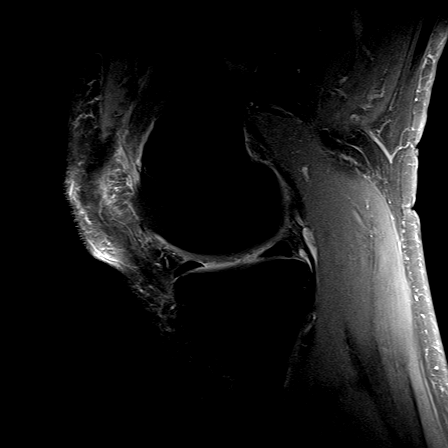
[im 12/24]
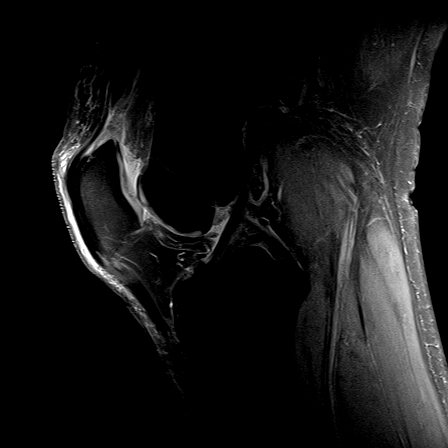
[im 16/24]
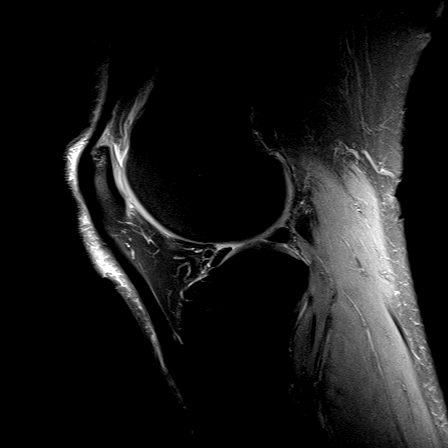
[im 20/24]
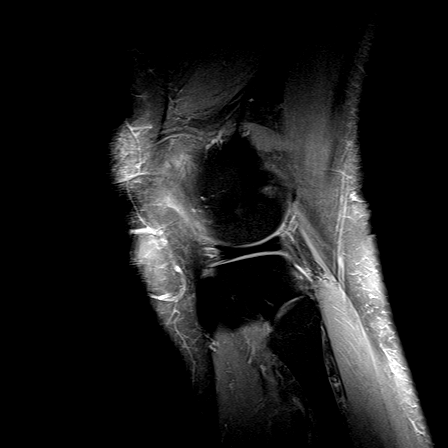
[im 24/24]
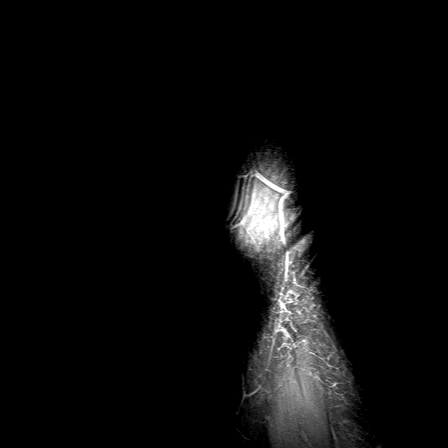

[Series 6: PD · coronal · 4.0mm · 0.38mm/px · 6 of 22 slices shown (2 of 2)]
[im 1/22]
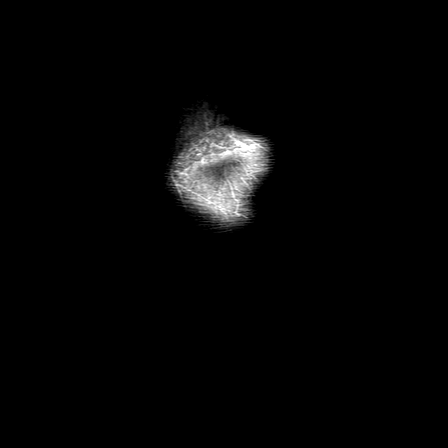
[im 5/22]
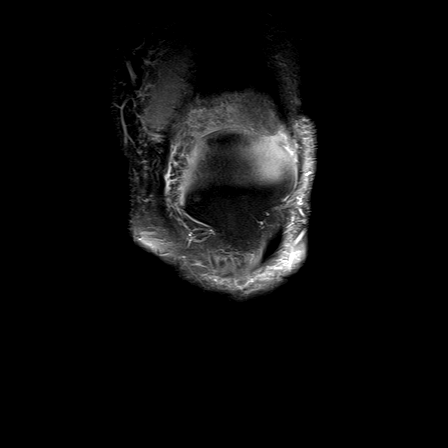
[im 9/22]
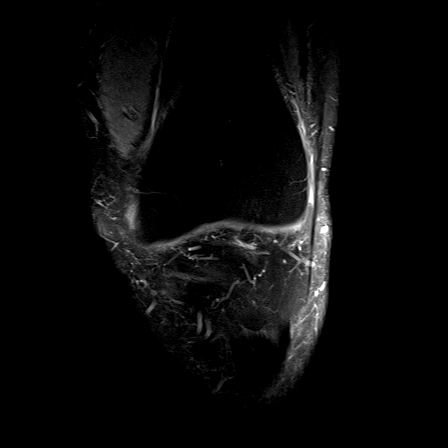
[im 13/22]
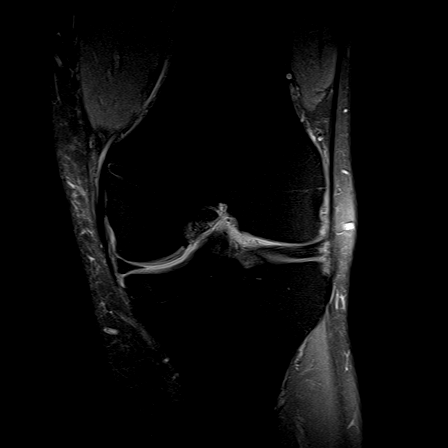
[im 17/22]
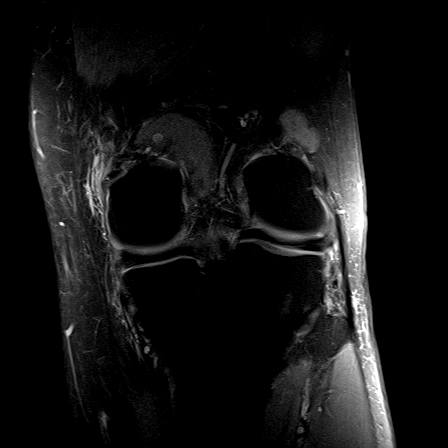
[im 22/22]
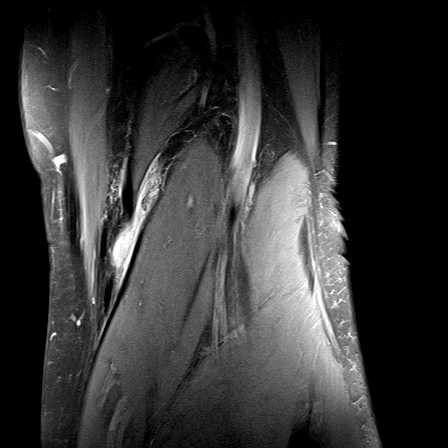

[Series 8: PD fat-sat · sagittal · 3.0mm · 0.44mm/px · 7 of 24 slices shown (2 of 2)]
[im 1/24]
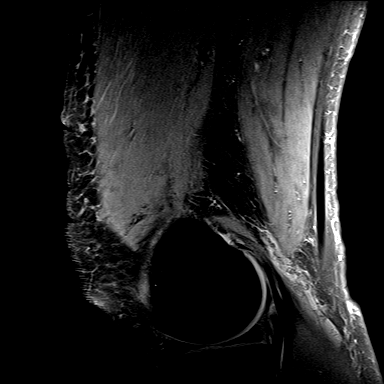
[im 4/24]
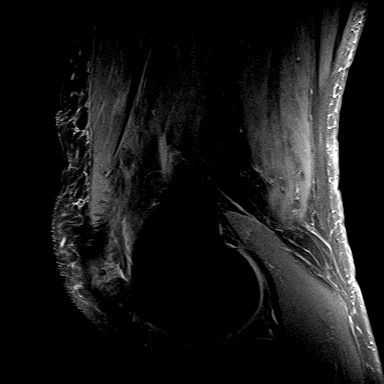
[im 8/24]
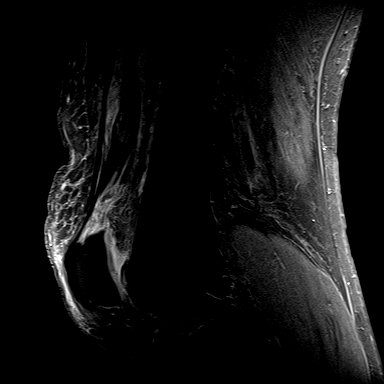
[im 12/24]
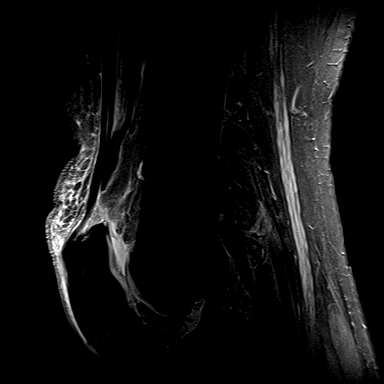
[im 16/24]
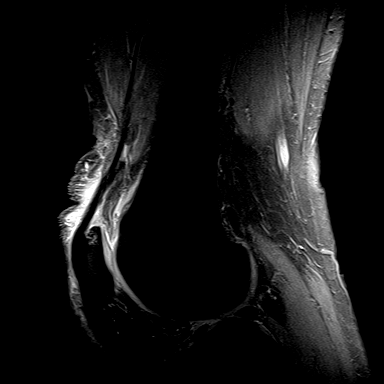
[im 20/24]
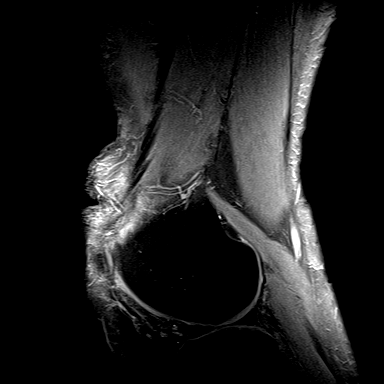
[im 24/24]
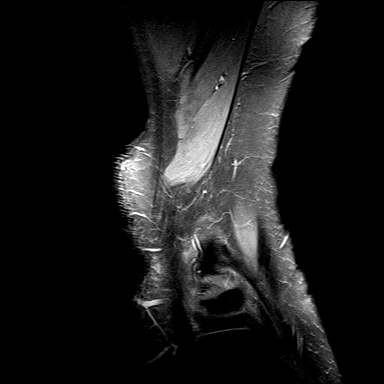

[28 of 40 positions shown; findings below may reference images not displayed]

FINDINGS: MENISCI

Medial meniscus: Degenerative signal in the posterior horn without
tear.

Lateral meniscus:  Intact.

LIGAMENTS

Cruciates:  Intact

Collaterals:  Intact.

CARTILAGE

Patellofemoral:  Minimally degenerated.

Medial:  Mildly degenerated.

Lateral:  Negative.

Joint:  Trace amount of joint fluid.

Popliteal Fossa:  Small Baker's cyst.

Extensor Mechanism: There is a high-grade tear of the quadriceps
tendon. The vastus intermedius appears completely torn and retracted
retracted 2 cm. A thin band of the superficial fibers of the rectus
femoris appears intact and attaches to the superior pole of the
patella. The vastus lateralis is intact. The vastus medialis is
difficult to fully evaluate but appears partially torn.

Bones: No fracture or worrisome lesion. Tiny subchondral cyst in the
superior pole of the lateral patellar facet is noted. There is also
a small focus of subchondral edema in the medial tibial plateau.

Other: None.
IMPRESSION: Extensive partial tear of the quadriceps tendon appears to involve
the entire vastus intermedius which is retracted approximately 2 cm
above the superior pole of the patella. High-grade partial tear of
the deep fibers of the rectus femoris is also seen. Thin band of
superficial fibers is intact. The vastus medialis is not well seen
but likely partially torn. Vastus lateralis is intact.

Negative for meniscal or cruciate or collateral ligament tear.

Mild osteoarthritis about the knee.

## 2018-09-11 DIAGNOSIS — F438 Other reactions to severe stress: Secondary | ICD-10-CM | POA: Diagnosis not present

## 2018-09-12 ENCOUNTER — Encounter: Payer: Self-pay | Admitting: Cardiothoracic Surgery

## 2018-09-12 ENCOUNTER — Ambulatory Visit
Admission: RE | Admit: 2018-09-12 | Discharge: 2018-09-12 | Disposition: A | Discharge status: Home / Self Care | Payer: Medicare Other | Source: Ambulatory Visit | Attending: Cardiothoracic Surgery | Admitting: Cardiothoracic Surgery

## 2018-09-12 ENCOUNTER — Ambulatory Visit (INDEPENDENT_AMBULATORY_CARE_PROVIDER_SITE_OTHER): Payer: Medicare Other | Admitting: Cardiothoracic Surgery

## 2018-09-12 ENCOUNTER — Other Ambulatory Visit: Payer: Self-pay

## 2018-09-12 VITALS — BP 132/98 | HR 105 | Resp 18 | Ht 72.0 in | Wt 202.0 lb

## 2018-09-12 DIAGNOSIS — I712 Thoracic aortic aneurysm, without rupture, unspecified: Secondary | ICD-10-CM

## 2018-09-12 DIAGNOSIS — Q231 Congenital insufficiency of aortic valve: Secondary | ICD-10-CM | POA: Diagnosis not present

## 2018-09-12 MED ORDER — IOPAMIDOL (ISOVUE-370) INJECTION 76%
75.0000 mL | Freq: Once | INTRAVENOUS | Status: AC | PRN
  Administered 2018-09-12: 75 mL via INTRAVENOUS

## 2018-09-12 NOTE — Patient Instructions (Signed)
Aortic Valve Stenosis Aortic valve stenosis is a narrowing of the aortic valve. The aortic valve opens and closes to regulate blood flow between the lower left chamber of the heart (left ventricle) and the blood vessel that leads away from the heart (aorta). When the aortic valve becomes narrow, it makes it difficult for the heart to pump blood into the aorta, which causes the heart to work harder. The extra work can weaken the heart over time. Aortic valve stenosis can range from mild to severe. If untreated, it can become more severe over time and can lead to heart failure. What are the causes? This condition may be caused by:  Buildup of calcium around and on the valve. This can occur with aging. This is the most common cause of aortic valve stenosis.  Birth defect.  Rheumatic fever.  Radiation to the chest. What increases the risk? You may be more likely to develop this condition if:  You are over the age of 65.  You were born with an abnormal bicuspid valve. What are the signs or symptoms? You may have no symptoms until your condition becomes severe. It may take 10-20 years for mild or moderate aortic valve stenosis to become severe. Symptoms may include:  Shortness of breath. This may get worse during physical activity.  Feeling unusually weak and tired (fatigue).  Extreme discomfort in the chest, neck, or arm (angina).  A heartbeat that is irregular or faster than normal (palpitations).  Dizziness or fainting. This may happen when you get physically tired or after you take certain heart medicines, such as nitroglycerin. How is this diagnosed? This condition may be diagnosed with:  A physical exam.  Echocardiogram. This is a type of imaging test that uses sound waves (ultrasound) to make an image of your heart. There are two types that may be used:  Transthoracic echocardiogram (TTE). This type of echocardiogram is noninvasive, and it is usually done  first.  Transesophageal echocardiogram (TEE). This type of echocardiogram is done by passing a flexible tube down your esophagus. The heart and the esophagus are close to each other, so your health care provider can take very clear, detailed pictures of the heart using this type of test.  Cardiac catheterization. In this procedure, a thin, flexible tube (catheter) is passed through a large vein in your neck, groin, or arm. This procedure provides information about arteries, structures, blood pressure, and oxygen levels in your heart.  Electrocardiogram (ECG). This records the electrical impulses of your heart and assesses heart function.  Stress tests. These are tests that evaluate the blood supply to your heart and your heart's response to exercise.  Blood tests. You may work with a health care provider who specializes in the heart (cardiologist). How is this treated? Treatment depends on how severe your condition is and what your symptoms are. You will need to have your heart checked regularly to make sure that your condition is not getting worse or causing serious problems. If your condition is mild, no treatment may be needed. Treatment may include:  Medicines that help keep your heart rate regular.  Medicines that thin your blood (anticoagulants) to prevent the formation of blood clots.  Antibiotic medicines to help prevent infection.  Surgery to replace your aortic valve. This is the most common treatment for aortic valve stenosis. Several types of surgeries are available. The surgery may be done through a large incision over your heart (open heart surgery), or it may be done using a minimally   done using a minimally invasive technique (transcatheter aortic valve replacement, or TAVR).  Follow these instructions at home: Lifestyle   Limit alcohol intake to no more than 1 drink per day for nonpregnant women and 2 drinks per day for men. One drink equals 12 oz of beer, 5 oz of wine, or 1 oz of hard  liquor.  Do not use any tobacco products, such as cigarettes, chewing tobacco, or e-cigarettes. If you need help quitting, ask your health care provider.  Work with your health care provider to manage your blood pressure and cholesterol.  Maintain a healthy weight. Eating and drinking  Follow instructions from your health care provider about eating or drinking restrictions. ? Limit how much caffeine you drink. Caffeine can affect your heart's rate and rhythm.  Drink enough fluid to keep your urine clear or pale yellow.  Eat a heart-healthy diet. This should include plenty of fresh fruits and vegetables. If you eat meat, it should be lean cuts. Avoid foods that are: ? High in salt, saturated fat, or sugar. ? Canned or highly processed. ? Fried. Activity  Return to your normal activities as told by your health care provider. Ask your health care provider what activities are safe for you.  Exercise regularly, as told by your health care provider. Ask your health care provider what types of exercise are safe for you.  If your aortic valve stenosis is mild, you may need to avoid only very intense physical activity. The more severe your aortic valve stenosis is, the more activities you may need to avoid. General instructions  Take over-the-counter and prescription medicines only as told by your health care provider.  If you are a woman and you plan to become pregnant, talk with your health care provider before you become pregnant.  Tell all health care providers who care for you that you have aortic valve stenosis.  Keep all follow-up visits as told by your health care provider. This is important. Contact a health care provider if:  You have a fever. Get help right away if:  You develop chest pain or tightness.  You develop shortness of breath or difficulty breathing.  You feel light-headed.  You feel like you might faint.  Your heartbeat is irregular or faster than  normal. These symptoms may represent a serious problem that is an emergency. Do not wait to see if the symptoms will go away. Get medical help right away. Call your local emergency services (911 in the [place] Do not drive yourself to the hospital. This information is not intended to replace advice given to you by your health care provider. Make sure you discuss any questions you have with your health care provider. Document Released: 09/02/2003 Document Revised: 05/11/2016 Document Reviewed: 11/07/2015 Elsevier Interactive Patient Education  2017 [place].                              Ascending Aortic Aneurysm/ Thoracic Aortic Aneurysm   Recent studies have raised concern that fluoroquinolone antibiotics could be associated with an increased risk of aortic aneurysm or aortic dissection. You should avoid use of Cipro and other associated antibiotics (flouroquinolone antibiotics )  It is  best to avoid activities that cause grunting or straining (medically referred to as a "valsalva maneuver"). This happens when a person bears down against a closed throat to increase the strength of arm or abdominal muscles. There's often a tendency to do this when  lifting heavy weights, doing sit-ups, push-ups or chin-ups, etc., but it may be harmful.     An aneurysm is a bulge in an artery. It happens when blood pushes up against a weakened or damaged artery wall. A thoracic aortic aneurysm is an aneurysm that occurs in the first part of the aorta, between the heart and the diaphragm. The aorta is the main artery of the body. It supplies blood from the heart to the rest of the body. Some aneurysms may not cause symptoms or problems. However, the major concern with a thoracic aortic aneurysm is that it can enlarge and burst (rupture), or blood can flow between the layers of the wall of the aorta through a tear (aorticdissection). Both of these conditions can cause bleeding inside the body and can be  life-threatening if they are not diagnosed and treated right away. What are the causes? The exact cause of this condition is not known. What increases the risk? The following factors may make you more likely to develop this condition:  Being age 65 or older.  Having a hardening of the arteries caused by the buildup of fat and other substances in the lining of a blood vessel (arteriosclerosis).  Having inflammation of the walls of an artery (arteritis).  Having a genetic disease that weakens the body's connective tissue, such as Marfan syndrome.  Having an injury or trauma to the aorta.  Having an infection that is caused by bacteria, such as syphilis or staphylococcus, in the wall of the aorta (infectious aortitis).  Having high blood pressure (hypertension).  Being male.  Being white (Caucasian).  Having high cholesterol.  Having a family history of aneurysms.  Using tobacco.  Having chronic obstructive pulmonary disease (COPD). What are the signs or symptoms? Symptoms of this condition vary depending on the size and rate of growth of the aneurysm. Most grow slowly and do not cause any symptoms. When symptoms do occur, they may include:  Pain in the chest, back, sides, or abdomen. The pain may vary in intensity. A sudden onset of severe pain may indicate that the aneurysm has ruptured.  Hoarseness.  Cough.  Shortness of breath.  Swallowing problems.  Swelling in the face, arms, or legs.  Fever.  Unexplained weight loss. How is this diagnosed? This condition may be diagnosed with:  An ultrasound.  X-rays.  A CT scan.  An MRI.  Tests to check the arteries for damage or blockages (angiogram). Most unruptured thoracic aortic aneurysms cause no symptoms, so they are often found during exams for other conditions. How is this treated? Treatment for this condition depends on:  The size of the aneurysm.  How fast the aneurysm is growing.  Your age.  Risk  factors for rupture. Aneurysms that are smaller than 2.2 inches (5.5 cm) may be managed by using medicines to control blood pressure, manage pain, or fight infection. You may need regular monitoring to see if the aneurysm is getting bigger. Your health care provider may recommend that you have an ultrasound every year or every 6 months. How often you need to have an ultrasound depends on the size of the aneurysm, how fast it is growing, and whether you have a family history of aneurysms. Surgical repair may be needed if your aneurysm is larger than 2.2 inches or if it is growing quickly. Follow these instructions at home: Eating and drinking   Eat a healthy diet. Your health care provider may recommend that you:  Lower your salt (sodium)  intake. In some people, too much salt can raise blood pressure and increase the risk of thoracic aortic aneurysm.  Avoid foods that are high in saturated fat and cholesterol, such as red meat and dairy.  Eat a diet that is low in sugar.  Increase your fiber intake by including whole grains, vegetables, and fruits in your diet. Eating these foods may help to lower blood pressure.  Limit or avoid alcohol as recommended by your health care provider. Lifestyle   Follow instructions from your health care provider about healthy lifestyle habits. Your health care provider may recommend that you:  Do not use any products that contain nicotine or tobacco, such as cigarettes and e-cigarettes. If you need help quitting, ask your health care provider.  Keep your blood pressure within normal limits. The target limit for most people is below 120/80. Check your blood pressure regularly. If it is high, ask your health care provider about ways that you can control it.  Keep your blood sugar (glucose) level and cholesterol levels within normal limits. Target limits for most people are:  Blood glucose level: Less than 100 mg/dL.  Total cholesterol level: Less than 200  mg/dL.  Maintain a healthy weight. Activity   Stay physically active and exercise regularly. Talk with your health care provider about how often you should exercise and ask which types of exercise are safe for you.  Avoid heavy lifting and activities that take a lot of effort (are strenuous). Ask your health care provider what activities are safe for you. General instructions   Keep all follow-up visits as told by your health care provider. This is important.  Talk with your health care provider about regular screenings to see if the aneurysm is getting bigger.  Take over-the-counter and prescription medicines only as told by your health care provider. Contact a health care provider if:  You have discomfort in your upper back, neck, or abdomen.  You have trouble swallowing.  You have a cough or hoarseness.  You have a family history of aneurysms.  You have unexplained weight loss. Get help right away if:  You have sudden, severe pain in your upper back and abdomen. This pain may move into your chest and arms.  You have shortness of breath.  You have a fever. This information is not intended to replace advice given to you by your health care provider. Make sure you discuss any questions you have with your health care provider. Document Released: 12/04/2005 Document Revised: 09/15/2016 Document Reviewed: 09/15/2016 Elsevier Interactive Patient Education  2017 [place].   Aortic Dissection An aortic dissection happens when there is a tear in the main blood vessel of the body (aorta). The aorta comes out of the heart, curves around, and then goes down the chest (thoracic aorta) and into the abdomen (abdominal aorta) to supply arteries with blood. The wall of the aorta has inner and outer layers. Aortic dissection occurs most often in the thoracic aorta. As the tear widens and blood flows through it, the aorta becomes "double-barreled." This means that one part of the aorta  continues to carry blood to the body, but blood also flows into the tear, between the layers of the aorta. The torn part of the aorta fills with blood and swells up. This can reduce blood flow through the part of the aorta that is still supplying blood to the body. Aortic dissection is a medical emergency. What are the causes? An aortic dissection is commonly caused by weakening  of the artery wall due to high blood pressure. Other causes may include:  An injury, such as from a car crash.  Birth defects that affect the heart (congenital heart defects).  Thickening of the artery walls. In some cases, the cause is not known. What increases the risk? The following factors may make you more likely to develop this condition:  Having certain medical conditions, such as:  High blood pressure (hypertension).  Hardening and narrowing of the arteries (atherosclerosis).  A genetic disorder that affects the connective tissue, such as Marfan syndrome or Ehlers-Danlos syndrome.  A condition that causes inflammation of blood vessels, such as giant cell arteritis.  Having a chest injury.  Having surgery on the aorta.  Being born with a congenital heart defect.  Being male.  Being older than age 26.  Using cocaine.  Smoking.  Lifting heavy weights or doing other types of high-intensity resistance training. What are the signs or symptoms? Signs and symptoms of aortic dissection start suddenly. The most common symptoms are:  Severe chest pain that may feel like tearing, stabbing, or sharp pain.  Severe pain that spreads (radiates) to the back, neck, jaw, or abdomen. Other symptoms may include:  Trouble breathing.  Dizziness or fainting.  Sudden weakness on one side of the body.  Nausea or vomiting.  Trouble swallowing.  Coughing up blood.  Vomiting blood.  Clammy skin. How is this diagnosed? This condition may be diagnosed based on:  Your symptoms.  A physical exam. This  may include:  Listening for abnormal blood flow sounds (murmurs) in your chest or abdomen.  Checking your pulse in your arms and legs.  Checking your blood pressure to see whether it is low or whether there is a difference between the measurements in your right and left arm.  Electrocardiogram (ECG). This test measures the electrical activity in your heart.  Chest X-ray.  CT scan.  MRI.  Aortic angiogram. This test involves injecting dye to make it easier to see your blood vessels clearly.  Echocardiogram to study your heart using sound waves.  Blood tests. How is this treated? It is important to treat an aortic dissection as quickly as possible. Treatment may start as soon as your health care provider thinks that you have aortic dissection. Treatment depends on the location and severity of your dissection and your overall health. Treatment may include:  Medicines to lower your blood pressure.  Surgery to repair the dissected part of your aorta with artificial material (syntheticgraft).  A medical procedure to insert a stent-graft into the aorta (endovascular procedure). During this procedure, a long, thin tube (stent) is inserted into an artery near the groin (femoral artery) and moved up to the damaged part of the aorta. Then, the stent is opened to help improve blood flow and prevent future dissection. Follow these instructions at home: Activity   Avoid activities that could injure your chest or your abdomen. Ask your health care provider what activities are safe for you.  After you have recovered, try to stay active. Ask your health care provider what activities are safe for you after recovery.  Do not lift anything that is heavier than 10 lb (4.5 kg) until your health care provider approves.  Do not drive or use heavy machinery while taking prescription pain medicine. Eating and drinking   Eat a heart-healthy diet, which includes lots of fresh fruits and vegetables,  low-fat (lean) protein, and whole grains.  Check ingredients and nutrition facts on packaged foods  and beverages, and avoid foods with high amounts of:  Salt (sodium).  Saturated fats (like red meat).  Trans fats (like fried food). General instructions   Take over-the-counter and prescription medicines only as told by your health care provider.  Work with your health care provider to manage your blood pressure.  Talk with your health care provider about how to manage stress.  Do not use any products that contain nicotine or tobacco, such as cigarettes and e-cigarettes. If you need help quitting, ask your health care provider.  Keep all follow-up visits as told by your health care provider. This is important. Get help right away if:  You develop any symptoms of aortic dissection after treatment, including severe pain in your chest, back, or abdomen.  You have a pain in your abdomen.  You have trouble breathing or you develop a cough.  You faint.  You develop a racing heartbeat. These symptoms may represent a serious problem that is an emergency. Do not wait to see if the symptoms will go away. Get medical help right away. Call your local emergency services (911 in the [place] Do not drive yourself to the hospital. Summary  An aortic dissection happens when there is a tear in the main blood vessel of the body (aorta). It is a medical emergency.  The most common symptom is severe pain in the chest that spreads (radiates) to the back, neck, jaw, or abdomen.  It is important to treat an aortic dissection as quickly as possible. Treatment typically includes surgery and medicines. This information is not intended to replace advice given to you by your health care provider. Make sure you discuss any questions you have with your health care provider. Document Released: 03/12/2008 Document Revised: 10/23/2016 Document Reviewed: 10/23/2016 Elsevier Interactive Patient Education  2017  [employer].

## 2018-09-12 NOTE — Progress Notes (Signed)
[address]       [place] [address]             [phone]                    Jose [name] [facility] Medical Record #[id number] Date of Birth: May 21, 1951  Referring: Lacretia Leigh, MD Primary Care: [name], MD Primary Cardiology: No primary care provider on file. Chief Complaint:    Chief Complaint  Patient presents with  . Thoracic Aortic Aneurysm    8 month f/u with CTA Chest    History of Present Illness:    Jose [name] 67 y.o. male who comes to the office today with a follow-up CTA of the chest.  He was first seen in the office 07/06/2015  referred by the emergency room at [facility]  because of a dilated ascending aorta. The patient has no previous history of cardiac disease, no history of known coronary artery disease or valvular disease.  The patient has a history of intracranial bleed/ subdural hematoma without history of trauma treated in [place].   He underwent right craniotomy.  In July 2016 he was having increasing cough discussed this with his neurologist who recommended that he be seen and have a CAT scan of the head done. While in the emergency room a CT scan of the chest was also done., Demonstrating mild dilatation of the ascending aorta. The patient is a nonsmoker.  He denies any symptoms of angina, echocardiograms confirmed a bicuspid aortic valve   History family history is significant for his father who died in his late 42s with myocardial infarction, after having his first myocardial infarction in his 23s. There is no family history of aortic aneurysms, unexplained sudden death at an early age or aortic dissection.He has one cousin who died in his 69s of sudden collapse thought to be a brain aneurysm   Since last seen , patient had repair of bilateral ruptured quadricep tendons, right right leg is now working well still having problems with the left leg and requires wearing a brace.  Patient denies any signs or symptoms of  congestive heart failure, denies angina, denies syncope or presyncope.  Continues to exercise on a regular basis without symptoms but is careful not to do strenuous lifting.  Is  Current Activity/ Functional Status:  Patient is independent with mobility/ambulation, transfers, ADL's, IADL's.   Zubrod Score: At the time of surgery this patient's most appropriate activity status/level should be described as: []     0    Normal activity, no symptoms [x]     1    Restricted in physical strenuous activity but ambulatory, able to do out light work []     2    Ambulatory and capable of self care, unable to do work activities, up and about               >50 % of waking hours                              []     3    Only limited self care, in bed greater than 50% of waking hours []     4    Completely disabled, no self care, confined to bed or chair []     5    Moribund   Past Medical History:  Diagnosis Date  . Arthritis    R  shoulder, great toes, hands- has gout & has injections in knees with Dr. Estanislado Pandy   . Cancer ([place])    skin ca-   . GERD (gastroesophageal reflux disease)   . History of hiatal hernia   . SDH (subdural hematoma) (HCC)     Past Surgical History:  Procedure Laterality Date  . BRAIN SURGERY  2016   evacuation of SDH  . CARPAL TUNNEL RELEASE Bilateral   . GREEN LIGHT LASER TURP (TRANSURETHRAL RESECTION OF PROSTATE  04/27/2017  . KNEE ARTHROPLASTY    . QUADRICEPS TENDON REPAIR Bilateral 06/15/2017   Procedure: REPAIR QUADRICEP TENDON;  Surgeon: Meredith Pel, MD;  Location: [place];  Service: Orthopedics;  Laterality: Bilateral;  . SHOULDER SURGERY Right     FAmily history: No change in family history since seen 8 months ago Both of the patient's parents are deceased his father died at age 53 of myocardial infarction previously in his early 67s he had 2 myocardial infarctions. His mother died with Parkinson's at age 27. He has a living brother and sister with history of  diabetes Parkinson's and depression, he has one son age 33. He has one cousin who died in his 61s of sudden collapse thought to be a brain aneurysm.   History   Social History  . Marital Status: Married    Spouse Name: N/A  . Number of Children: N/A  . Years of Education: N/A   Occupational History  Patient works as a Optometrist in the [employer]  . Smoking status: Never Smoker   . Smokeless tobacco: Not on file  . Alcohol Use: No  . Drug Use: No  . Sexual Activity: Not on file     Social History   Tobacco Use  Smoking Status Never Smoker  Smokeless Tobacco Never Used    Social History   Substance and Sexual Activity  Alcohol Use Yes  . Alcohol/week: 8.0 standard drinks  . Types: 1 Shots of liquor, 7 Glasses of wine per week   Comment: 2 drinks per day     Allergies  Allergen Reactions  . Quinolones     Patient was warned about not using Cipro and similar antibiotics. Recent studies have raised concern that fluoroquinolone antibiotics could be associated with an increased risk of aortic aneurysm Fluoroquinolones have non-antimicrobial properties that might jeopardise the integrity of the extracellular matrix of the vascular wall In a  propensity score matched cohort study in [place], there was a 66% increased rate of aortic aneurysm or dissection associated with oral fluoroquinolone use, compared wit    Current Outpatient Medications  Medication Sig Dispense Refill  . acetaminophen (TYLENOL) 500 MG tablet Take 500-1,000 mg by mouth every 6 (six) hours as needed for mild pain.    Marland Kitchen allopurinol (ZYLOPRIM) 300 MG tablet Take 1 tablet (300 mg total) by mouth daily. 90 tablet 1  . baclofen (LIORESAL) 10 MG tablet TAKE 1 TABLET BY MOUTH TWICE A DAY (Patient taking differently: TAKE 1 TABLET BY MOUTH TWICE A DAY, AS NEEDED) 60 tablet 1  . diclofenac sodium (VOLTAREN) 1 % GEL Apply to left knee prn pain daily 3 Tube 1  . esomeprazole  (NEXIUM) 40 MG capsule Take 1 capsule (40 mg total) by mouth daily. 30 capsule 0  . Multiple Vitamin (MULTIVITAMIN WITH MINERALS) TABS tablet Take 1 tablet by mouth daily.    Marland Kitchen UNABLE TO FIND 1 tablet as needed. Med Name: Col-quit    .  metoprolol succinate (TOPROL-XL) 25 MG 24 hr tablet TAKE 1 TABLET (25 MG TOTAL) BY MOUTH DAILY. TAKE WITH OR IMMEDIATELY FOLLOWING A MEAL. 90 tablet 3   No current facility-administered medications for this visit.       Review of Systems:  Review of Systems  Constitutional: Negative.   HENT: Negative.   Eyes: Negative.   Respiratory: Negative.   Cardiovascular: Negative.   Gastrointestinal: Negative.   Genitourinary: Positive for dysuria and frequency.  Musculoskeletal: Positive for joint pain.  Skin: Negative.   Neurological: Negative.   Endo/Heme/Allergies: Negative.   Psychiatric/Behavioral: Negative.     Physical Exam: BP (!) 132/98 (BP Location: Left Arm, Patient Position: Sitting, Cuff Size: Normal)   Pulse (!) 105   Resp 18   Ht 6' (1.829 m)   Wt 202 lb (91.6 kg)   SpO2 98% Comment: RA  BMI 27.40 kg/m   PHYSICAL EXAMINATION: General appearance: alert, cooperative, appears stated age and no distress Head: Normocephalic, without obvious abnormality, atraumatic Neck: no adenopathy, no carotid bruit, no JVD, supple, symmetrical, trachea midline and thyroid not enlarged, symmetric, no tenderness/mass/nodules Lymph nodes: Cervical, supraclavicular, and axillary nodes normal. Resp: clear to auscultation bilaterally Back: symmetric, no curvature. ROM normal. No CVA tenderness. Cardio: regular rate and rhythm and systolic murmur: early systolic 2/6, crescendo at 2nd left intercostal space GI: soft, non-tender; bowel sounds normal; no masses,  no organomegaly Extremities: extremities normal, atraumatic, no cyanosis or edema, Homans sign is negative, no sign of DVT and Bilateral knee surgery Neurologic: Grossly normal  DP and PT pulses 2+  bilaterally radial pulses and radial pulses are equal bilaterally Brace on left knee no palpable popliteal aneurysm appreciated  Diagnostic Studies & Laboratory data:     Recent Radiology Findings:  Ct Angio Chest Aorta W &/or Wo Contrast  Result Date: 09/12/2018 CLINICAL DATA:  Follow-up thoracic aortic aneurysm EXAM: CT ANGIOGRAPHY CHEST WITH CONTRAST TECHNIQUE: Multidetector CT imaging of the chest was performed using the standard protocol during bolus administration of intravenous contrast. Multiplanar CT image reconstructions and MIPs were obtained to evaluate the vascular anatomy. CONTRAST:  78mL ISOVUE-370 IOPAMIDOL (ISOVUE-370) INJECTION 76% Creatinine was obtained on site at [facility] at [address] Results: Creatinine 1.1 mg/dL.  GFR 69 COMPARISON:  01/17/2018 FINDINGS: Cardiovascular: The thoracic aorta demonstrates a normal branching pattern. Dilatation of the ascending aorta is again identified a 4.5 cm in greatest diameter. Calcific changes of the aortic valve are noted. Bicuspid aortic valve is again noted. At the level of the sinus of Valsalva, the aorta measures approximately 4.2 cm. At the sino-tubular junction the measurement is approximately 3.5 cm. These are roughly stable from the prior exam given some slight variation in the imaging technique. No dissection is noted. And normal tapering of the thoracic aortic arch and descending aorta is seen. Visualized visceral or vessels in the abdomen are within normal limits. No cardiac enlargement is seen. The pulmonary artery although not timed for embolus evaluation appears within normal limits. No significant coronary calcifications are noted. Mediastinum/Nodes: Thoracic inlet is again within normal limits. No hilar or mediastinal adenopathy is noted. The esophagus as visualized is within normal limits. Lungs/Pleura: Lungs are again well aerated without focal infiltrate or sizable effusion. Upper Abdomen: Fatty infiltration  of the liver is again seen. The upper abdomen is otherwise within normal limits. Musculoskeletal: Degenerative changes of the thoracic spine are noted. No compression deformities are seen. No other bony abnormality is noted. Review of the MIP images  confirms the above findings. IMPRESSION: Stable appearance of ascending aortic aneurysm to 4.5 cm. Ascending thoracic aortic aneurysm. Recommend semi-annual imaging followup by CTA or MRA. This recommendation follows 2010 ACCF/AHA/AATS/ACR/ASA/SCA/SCAI/SIR/STS/SVM Guidelines for the Diagnosis and Management of Patients With Thoracic Aortic Disease. Circulation. 2010; 121: U725-D664 Bicuspid aortic valve with calcifications. Aortic aneurysm NOS (ICD10-I71.9). Electronically Signed   By: Inez Catalina M.D.   On: 09/12/2018 13:20   I have independently reviewed the above radiology studies  and reviewed the findings with the patient.    Ct Angio Chest Aorta W/cm &/or Wo/cm  Result Date: 01/17/2018 CLINICAL DATA:  Followup thoracic aortic aneurysm EXAM: CT ANGIOGRAPHY CHEST WITH CONTRAST TECHNIQUE: Multidetector CT imaging of the chest was performed using the standard protocol during bolus administration of intravenous contrast. Multiplanar CT image reconstructions and MIPs were obtained to evaluate the vascular anatomy. CONTRAST:  75 mL Isovue 370. Creatinine was obtained on site at [facility] at [address] Results: Creatinine 1.2 mg/dL. COMPARISON:  Coronary CT angiogram from 11/22/2017 FINDINGS: Cardiovascular: Thoracic aorta again demonstrates dilatation of the ascending aorta. It measures approximately 4.5 x 4.3 cm in greatest dimension at the level of the main pulmonary artery. It measures approximately 4.1 cm at the level of the sinus of Valsalva. The sino-tubular junction measures 3.6 cm stable from the prior exam. Bicuspid aortic valve is again noted with calcifications similar to that seen on the recent exam. No significant coronary  calcifications are noted. No cardiac enlargement is noted. The pulmonary artery as visualized is within normal limits. Mediastinum/Nodes: Thoracic inlet is within normal limits. No hilar or mediastinal adenopathy is noted. The esophagus is within normal limits. Lungs/Pleura: The lungs are well aerated bilaterally without evidence of focal infiltrate or sizable effusion. Upper Abdomen: No acute abnormality is noted in the upper abdomen. Fatty infiltration of the liver is seen. Musculoskeletal: Degenerative changes of the thoracic spine are noted. No acute bony abnormality is seen. Review of the MIP images confirms the above findings. IMPRESSION: Overall stable appearance of the ascending aortic aneurysm measuring 4.5 cm in greatest dimension. Recommend semi-annual imaging followup by CTA or MRA. This recommendation follows 2010 ACCF/AHA/AATS/ACR/ASA/SCA/SCAI/SIR/STS/SVM Guidelines for the Diagnosis and Management of Patients With Thoracic Aortic Disease. Circulation. 2010; 121: Q034-V425 Bicuspid aortic valve. No acute abnormality is noted. Aortic aneurysm NOS (ICD10-I71.9). Electronically Signed   By: Inez Catalina M.D.   On: 01/17/2018 15:20       Ct Angio Chest Aorta W &/or Wo Contrast  Result Date: 01/04/2017 CLINICAL DATA:  Follow-up of ascending thoracic aortic aneurysm. EXAM: CT ANGIOGRAPHY CHEST WITH CONTRAST TECHNIQUE: Multidetector CT imaging of the chest was performed using the standard protocol during bolus administration of intravenous contrast. Multiplanar CT image reconstructions and MIPs were obtained to evaluate the vascular anatomy. CONTRAST:  75 mL of Isovue 370 COMPARISON:  January 06, 2016 FINDINGS: Cardiovascular: The thoracic aorta measures 4.3 cm in AP diameter at its root root on series 3, image 59 versus 4.3 cm previously. The thoracic aorta measures 4.5 cm at the level of the right main pulmonary artery, unchanged. The thoracic aorta measures 4.1 cm distally, as it enters the arch on  series 3, image 36 versus 4.1 cm previously. The remainder of the thoracic aorta is normal in caliber. No dissection. The vessels off of the thoracic aortic arch are stable. Central pulmonary arteries are unremarkable. The heart size is normal and unchanged. No effusions. Mediastinum/Nodes: No enlarged mediastinal, hilar, or axillary lymph nodes. Thyroid gland,  trachea, and esophagus demonstrate no significant findings. Lungs/Pleura: Lungs are clear. No pleural effusion or pneumothorax. 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 There is hepatic steatosis. The upper abdomen is otherwise unremarkable. Musculoskeletal: Degenerative changes seen in the thoracic spine. No other bony abnormalities. Review of the MIP images confirms the above findings. IMPRESSION: 1. Aneurysmal dilatation of the ascending thoracic aorta measuring up to 4.5 cm, unchanged in the interval. 2. 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 3. No other acute abnormalities. Ascending thoracic aortic aneurysm. Recommend semi-annual imaging followup by CTA or MRA and referral to cardiothoracic surgery if not already obtained. This recommendation follows 2010 ACCF/AHA/AATS/ACR/ASA/SCA/SCAI/SIR/STS/SVM Guidelines for the Diagnosis and Management of Patients With Thoracic Aortic Disease. Circulation. 2010; 121: D220-U542 Electronically Signed   By: Dorise Bullion III M.D   On: 01/04/2017 14:00  Recent Lab Findings: Lab Results  Component Value Date   WBC 6.5 03/29/2018   HGB 13.9 03/29/2018   HCT 40.3 03/29/2018   PLT 193 03/29/2018   GLUCOSE 118 (H) 03/29/2018   CHOL 243 (HH) 12/25/2007   TRIG 126 12/25/2007   HDL 66.4 12/25/2007   LDLDIRECT 164.4 12/25/2007   ALT 24 03/29/2018   AST 18 03/29/2018   NA 144 03/29/2018   K 4.7 03/29/2018   CL 108 03/29/2018   CREATININE 1.01 03/29/2018   BUN 19  03/29/2018   CO2 31 03/29/2018   HGBA1C 5.5 09/04/2017   Transthoracic Echocardiography  Patient:    Tenoch, Mcclure MR #:       [id number] Study Date: 02/18/2018 Gender:     M Age:        34 Height:     182.9 cm Weight:     87.5 kg BSA:        2.12 m^2 Pt. Status: Room:   ATTENDING    [name], M.D.  ORDERING     [name], M.D.  REFERRING    [name], M.D.  SONOGRAPHER  Wyatt Mage, RDCS  PERFORMING   [facility], Outpatient  cc:  ------------------------------------------------------------------- LV EF: 50% -   55%  ------------------------------------------------------------------- Indications:      Aortic stenosis (I35).  ------------------------------------------------------------------- History:   Risk factors:  Bicuspid aortic valve. Current tobacco use.  ------------------------------------------------------------------- Study Conclusions  - Left ventricle: The cavity size was normal. Wall thickness was   normal. Systolic function was normal. The estimated ejection   fraction was in the range of 50% to 55%. Wall motion was normal;   there were no regional wall motion abnormalities. Doppler   parameters are consistent with abnormal left ventricular   relaxation (grade 1 diastolic dysfunction). - Aortic valve: Bicuspid; moderately calcified leaflets. Valve   mobility was restricted. There was moderate stenosis. Mean   gradient (S): 30 mm Hg. Peak gradient (S): 53 mm Hg. - Ascending aorta: The ascending aorta was mildly dilated.  Impressions:  - Normal LV systolic function; mild diastolic dysfunction; mildly   dilated ascending aorta (45 mm); suggest CTA or MRA to further   assess; bicuspid aortic valve with moderate AS (mean gradient 30   mmHg).  ------------------------------------------------------------------- Labs, prior tests, procedures, and surgery: Transthoracic echocardiography (02/21/2017).    The aortic valve showed moderate  stenosis.  EF was 50%. Aortic valve: peak gradient of 35 mm Hg and mean gradient of 22 mm Hg.  ------------------------------------------------------------------- Study data:  Comparison was  made to the study of 02/21/2017.  Study status:  Routine.  Procedure:  The patient reported no pain pre or post test. Transthoracic echocardiography. Image quality was adequate.  Study completion:  There were no complications. Transthoracic echocardiography.  M-mode, complete 2D, spectral Doppler, and color Doppler.  Birthdate:  Patient birthdate: 01/04/1951.  Age:  Patient is 67 yr old.  Sex:  Gender: male. BMI: 26.2 kg/m^2.  Blood pressure:     160/94  Patient status: Outpatient.  Study date:  Study date: 02/18/2018. Study time: 09:34 AM.  Location:  [facility]  -------------------------------------------------------------------  ------------------------------------------------------------------- Left ventricle:  The cavity size was normal. Wall thickness was normal. Systolic function was normal. The estimated ejection fraction was in the range of 50% to 55%. Wall motion was normal; there were no regional wall motion abnormalities. Doppler parameters are consistent with abnormal left ventricular relaxation (grade 1 diastolic dysfunction).  ------------------------------------------------------------------- Aortic valve:   Bicuspid; moderately calcified leaflets. Valve mobility was restricted.  Doppler:   There was moderate stenosis. There was no regurgitation.    VTI ratio of LVOT to aortic valve: 0.2. Valve area (VTI): 0.63 cm^2. Indexed valve area (VTI): 0.3 cm^2/m^2. Peak velocity ratio of LVOT to aortic valve: 0.22. Valve area (Vmax): 0.7 cm^2. Indexed valve area (Vmax): 0.33 cm^2/m^2. Mean velocity ratio of LVOT to aortic valve: 0.19. Valve area (Vmean): 0.61 cm^2. Indexed valve area (Vmean): 0.29 cm^2/m^2. Mean gradient (S): 30 mm Hg. Peak gradient (S): 53 mm  Hg.  ------------------------------------------------------------------- Aorta:  Aortic root: The aortic root was normal in size. Ascending aorta: The ascending aorta was mildly dilated.  ------------------------------------------------------------------- Mitral valve:   Structurally normal valve.   Mobility was not restricted.  Doppler:  Transvalvular velocity was within the normal range. There was no evidence for stenosis. There was no regurgitation.    Valve area by pressure half-time: 2.02 cm^2. Indexed valve area by pressure half-time: 0.95 cm^2/m^2.  ------------------------------------------------------------------- Left atrium:  The atrium was normal in size.  ------------------------------------------------------------------- Right ventricle:  The cavity size was normal. Systolic function was normal.  ------------------------------------------------------------------- Pulmonic valve:    Doppler:  Transvalvular velocity was within the normal range. There was no evidence for stenosis.  ------------------------------------------------------------------- Tricuspid valve:   Structurally normal valve.    Doppler: Transvalvular velocity was within the normal range. There was trivial regurgitation.  ------------------------------------------------------------------- Right atrium:  The atrium was normal in size.  ------------------------------------------------------------------- Pericardium:  There was no pericardial effusion.  ------------------------------------------------------------------- Measurements   Left ventricle                            Value          Reference  LV ID, ED, PLAX chordal                   47    mm       43 - 52  LV ID, ES, PLAX chordal                   33    mm       23 - 38  LV fx shortening, PLAX chordal            30    %        >=29  LV PW thickness, ED  9     mm       ---------  IVS/LV PW ratio, ED                        1.11           <=1.3  Stroke volume, 2D                         54    ml       ---------  Stroke volume/bsa, 2D                     25    ml/m^2   ---------  LV e&', lateral                            8.16  cm/s     ---------  LV E/e&', lateral                          6              ---------  LV e&', medial                             3.83  cm/s     ---------  LV E/e&', medial                           12.79          ---------  LV e&', average                            6     cm/s     ---------  LV E/e&', average                          8.17           ---------    Ventricular septum                        Value          Reference  IVS thickness, ED                         10    mm       ---------    LVOT                                      Value          Reference  LVOT ID, S                                20    mm       ---------  LVOT area                                 3.14  cm^2     ---------  LVOT peak velocity, S  80.9  cm/s     ---------  LVOT mean velocity, S                     50.7  cm/s     ---------  LVOT VTI, S                               17.3  cm       ---------    Aortic valve                              Value          Reference  Aortic valve peak velocity, S             364   cm/s     ---------  Aortic valve mean velocity, S             261   cm/s     ---------  Aortic valve VTI, S                       86.3  cm       ---------  Aortic mean gradient, S                   30    mm Hg    ---------  Aortic peak gradient, S                   53    mm Hg    ---------  VTI ratio, LVOT/AV                        0.2            ---------  Aortic valve area, VTI                    0.63  cm^2     ---------  Aortic valve area/bsa, VTI                0.3   cm^2/m^2 ---------  Velocity ratio, peak, LVOT/AV             0.22           ---------  Aortic valve area, peak velocity          0.7   cm^2     ---------  Aortic valve area/bsa, peak                0.33  cm^2/m^2 ---------  velocity  Velocity ratio, mean, LVOT/AV             0.19           ---------  Aortic valve area, mean velocity          0.61  cm^2     ---------  Aortic valve area/bsa, mean               0.29  cm^2/m^2 ---------  velocity    Aorta                                     Value          Reference  Aortic root ID, ED  33    mm       ---------  Ascending aorta ID, A-P, S                45    mm       ---------    Left atrium                               Value          Reference  LA ID, A-P, ES                            39    mm       ---------  LA ID/bsa, A-P                            1.84  cm/m^2   <=2.2  LA volume, S                              31.3  ml       ---------  LA volume/bsa, S                          14.8  ml/m^2   ---------  LA volume, ES, 1-p A4C                    23.5  ml       ---------  LA volume/bsa, ES, 1-p A4C                11.1  ml/m^2   ---------  LA volume, ES, 1-p A2C                    42    ml       ---------  LA volume/bsa, ES, 1-p A2C                19.8  ml/m^2   ---------    Mitral valve                              Value          Reference  Mitral E-wave peak velocity               49    cm/s     ---------  Mitral A-wave peak velocity               85.9  cm/s     ---------  Mitral deceleration time          (H)     373   ms       150 - 230  Mitral pressure half-time                 109   ms       ---------  Mitral E/A ratio, peak                    0.6            ---------  Mitral valve area, PHT, DP                2.02  cm^2     ---------  Mitral valve area/bsa, PHT, DP            0.95  cm^2/m^2 ---------    Systemic veins                            Value          Reference  Estimated CVP                             3     mm Hg    ---------    Right ventricle                           Value          Reference  TAPSE                                     21.7  mm       ---------  RV s&', lateral, S                          11    cm/s     ---------  Legend: (L)  and  (H)  mark values outside specified reference range.  ------------------------------------------------------------------- Prepared and Electronically Authenticated by  Kirk Ruths 2019-03-04T14:13:12  Aortic Size Index=  4.5  /Body surface area is 2.16 meters squared. =2.13  < 2.75 cm/m2      4% risk per year 2.75 to 4.25          8% risk per year > 4.25 cm/m2    20% risk per year  Aortic Cross section area/ Height ratio= 8.7    Assessment / Plan:   1/ Dilatation of the ascending aorta 4.5  without other stigmata of connective tissue disorder or family history of dilated aorta or dissection.  2/ echo documented bicuspid aortic valve with  moderate AS (mean gradient 30   mmHg). Stenosis Aortic- valve peak velocity, S  364   cm/s asymptomatic 3/hypertension   Patient was warned about not using Cipro and similar antibiotics. Recent studies have raised concern that fluoroquinolone antibiotics could be associated with an increased risk of aortic aneurysm Fluoroquinolones have non-antimicrobial properties that might jeopardise the integrity of the extracellular matrix of the vascular wall In a  propensity score matched cohort study in [place], there was a 66% increased rate of aortic aneurysm or dissection associated with oral fluoroquinolone use, compared with amoxicillin use, within a 60 day risk period from start of treatment  I've cautioned the patient about competitive or strenuous weightlifting involving Valsalva, and also the need for good blood pressure control  With abnormal valve the patient was cautioned about importance of continuing with good dental care   According to the 2010 ACC/AHA guidelines, we recommend patients with thoracic aortic disease to maintain a LDL of less than 70 and a HDL of greater than 50. We recommend their blood pressure to remain less than 135/85.   Discussed with patient  follow-up CTA of chest 8 months, he continues to have his aortic valve monitored for progression of aortic stenosis by cardiology with serial echocardiograms.    Grace Isaac MD      [address] [place] [address] Office [phone]  Beeper [phone]  09/12/2018 3:51 PM

## 2018-09-17 ENCOUNTER — Telehealth: Payer: Self-pay | Admitting: Cardiovascular Disease

## 2018-09-17 NOTE — Telephone Encounter (Signed)
Called patient back. Informed patient that he should be due for another echo in March. Per his last echo, he was suppose to repeat in one year, 02/19/2019 will be one year. Informed patient that he can discuss if he needs it earlier at his next visit with Dr. Johnsie Cancel. Patient stated he recently had a CT as well with Dr. Servando Snare and Dr. Servando Snare wanted to make sure he got an echo as well. Will forward to Dr. Johnsie Cancel to make sure we do not need a echo sooner than March.

## 2018-09-17 NOTE — Telephone Encounter (Signed)
New Message:     Pt wants to know if he needs an Echo before his appointment on 10-17-18?

## 2018-09-18 DIAGNOSIS — F438 Other reactions to severe stress: Secondary | ICD-10-CM | POA: Diagnosis not present

## 2018-09-18 NOTE — Telephone Encounter (Signed)
Left message on patient's voicemail that he would not need echo before his up coming appointment, and to call if he has any questions.

## 2018-09-18 NOTE — Telephone Encounter (Signed)
Aneurysm was stable on CT can have TTE in March for AS

## 2018-09-19 ENCOUNTER — Ambulatory Visit
Admission: RE | Admit: 2018-09-19 | Discharge: 2018-09-19 | Disposition: A | Discharge status: Home / Self Care | Payer: Medicare Other | Source: Ambulatory Visit | Attending: Nurse Practitioner | Admitting: Nurse Practitioner

## 2018-09-19 ENCOUNTER — Other Ambulatory Visit: Payer: Self-pay | Admitting: Nurse Practitioner

## 2018-09-19 DIAGNOSIS — R310 Gross hematuria: Secondary | ICD-10-CM

## 2018-09-19 MED ORDER — IOPAMIDOL (ISOVUE-300) INJECTION 61%
100.0000 mL | Freq: Once | INTRAVENOUS | Status: AC | PRN
  Administered 2018-09-19: 100 mL via INTRAVENOUS

## 2018-09-23 DIAGNOSIS — R972 Elevated prostate specific antigen [PSA]: Secondary | ICD-10-CM | POA: Diagnosis not present

## 2018-09-24 ENCOUNTER — Other Ambulatory Visit: Payer: Medicare Other

## 2018-09-25 ENCOUNTER — Other Ambulatory Visit: Payer: Self-pay | Admitting: Orthopaedic Surgery

## 2018-09-25 DIAGNOSIS — Z4789 Encounter for other orthopedic aftercare: Secondary | ICD-10-CM

## 2018-09-25 DIAGNOSIS — F438 Other reactions to severe stress: Secondary | ICD-10-CM | POA: Diagnosis not present

## 2018-09-30 ENCOUNTER — Telehealth (INDEPENDENT_AMBULATORY_CARE_PROVIDER_SITE_OTHER): Payer: Self-pay | Admitting: Orthopedic Surgery

## 2018-09-30 DIAGNOSIS — M5410 Radiculopathy, site unspecified: Secondary | ICD-10-CM

## 2018-09-30 NOTE — Telephone Encounter (Signed)
I think a repeat injection would be reasonable.  He definitely has reason with foraminal stenosis to be having some back issues and leg issues.  I did review the CT scan and it did not show anything new but I think more injections are a reasonable option.  Please call and schedule that for him thanks

## 2018-09-30 NOTE — Telephone Encounter (Signed)
Please advise. Patient still having a lot of numbness in his left leg. He has had multiple PT visits and an injection with Dr Ernestina Patches back in March. Do you recommend repeat injection? Patient recently had a CT scan Abd/Pelvis please review impression of that as well. Thanks.

## 2018-09-30 NOTE — Telephone Encounter (Signed)
Patient said he is having a lot of back pain and numbness in his left leg. He is requesting a call back from you # [phone]

## 2018-09-30 NOTE — Progress Notes (Signed)
Office Visit Note  Patient: Jose [name]             Date of Birth: 1951-11-30           MRN: [id number]             PCP: [name], MD Referring: [name], MD Visit Date: 10/14/2018 Occupation: @GUAROCC @  Subjective:  Lower back pain   History of Present Illness: Jose [name] is a 67 y.o. male with history of gout and osteoarthritis.  He takes Allopurinol 300 mg po daily and colchicine 0.6 mg by mouth PRN.  He denies any recent gout flares.  He reports he has an appointment with Dr. Ernestina Patches for an epidural steroid injection this morning.  He states he typically gets good results following the injections.  He reports that both trigger fingers have resolved following the cortisone injections in August. He continues to have bilateral knee pain and weakness due to previous quadricep tendon rupture bilaterally. He has been wearing a brace on the left knee joint to have with stabilization.     Activities of Daily Living:  Patient reports morning stiffness for 2  minutes.   Patient Denies nocturnal pain.  Difficulty dressing/grooming: Denies Difficulty climbing stairs: Reports Difficulty getting out of chair: Reports Difficulty using hands for taps, buttons, cutlery, and/or writing: Denies  Review of Systems  Constitutional: Negative for fatigue and night sweats.  HENT: Negative for mouth sores, mouth dryness and nose dryness.   Eyes: Positive for dryness. Negative for redness and visual disturbance.  Respiratory: Negative for cough, hemoptysis, shortness of breath and difficulty breathing.   Cardiovascular: Negative for chest pain, palpitations, hypertension, irregular heartbeat and swelling in legs/feet.  Gastrointestinal: Negative for blood in stool, constipation and diarrhea.  Endocrine: Negative for increased urination.  Genitourinary: Negative for painful urination.  Musculoskeletal: Positive for arthralgias, joint pain, myalgias, morning stiffness and myalgias.  Negative for joint swelling, muscle weakness and muscle tenderness.  Skin: Negative for color change, rash, hair loss, nodules/bumps, skin tightness, ulcers and sensitivity to sunlight.  Allergic/Immunologic: Negative for susceptible to infections.  Neurological: Negative for dizziness, fainting, memory loss, night sweats and weakness.  Hematological: Negative for swollen glands.  Psychiatric/Behavioral: Negative for depressed mood and sleep disturbance. The patient is not nervous/anxious.     PMFS History:  Patient Active Problem List   Diagnosis Date Noted  . Candidiasis   . Abnormality of gait   . Hypoalbuminemia due to protein-calorie malnutrition ([facility] [place])   . History of gout   . Sleep disturbance   . Constipation   . Rupture of quadriceps tendon, right, sequela 06/19/2017  . Quadriceps tendon rupture, left, sequela 06/19/2017  . Acute blood loss anemia 06/19/2017  . Fall   . History of subdural hematoma   . Post-operative pain   . Recurrent falls   . Quadriceps tendon rupture 06/15/2017  . Primary osteoarthritis of both knees 02/28/2017  . Primary osteoarthritis of both hands 02/28/2017  . Uricacidemia 02/28/2017  . Pain in joint of left knee 02/07/2017  . Idiopathic chronic gout, unspecified site, without tophus (tophi) 11/29/2016  . HEMATURIA UNSPECIFIED 01/25/2009  . ABDOMINAL PAIN 01/25/2009  . XERODERMA 03/10/2008  . UNS ADVRS EFF UNS RX MEDICINAL&BIOLOGICAL SBSTNC 03/10/2008  . ADJUSTMENT REACTION WITH PHYSICAL SYMPTOMS 02/14/2008  . MUSCLE SPASM 02/14/2008  . ACUTE PROSTATITIS 12/16/2007  . BREAST LUMP OR MASS, RIGHT 07/12/2007  . H/O: RCT (rotator cuff tear) 01/13/2007  . GOUT 01/08/2007    Past  Medical History:  Diagnosis Date  . Arthritis    R shoulder, great toes, hands- has gout & has injections in knees with Dr. Estanislado Pandy   . Cancer ([place])    skin ca-   . GERD (gastroesophageal reflux disease)   . History of hiatal hernia   . SDH (subdural hematoma)  ([facility])     Family History  Problem Relation Age of Onset  . Parkinson's disease Mother   . Heart disease Father   . Parkinson's disease Brother   . Diabetes Brother    Past Surgical History:  Procedure Laterality Date  . BRAIN SURGERY  2016   evacuation of SDH  . CARPAL TUNNEL RELEASE Bilateral   . GREEN LIGHT LASER TURP (TRANSURETHRAL RESECTION OF PROSTATE  04/27/2017  . KNEE ARTHROPLASTY    . QUADRICEPS TENDON REPAIR Bilateral 06/15/2017   Procedure: REPAIR QUADRICEP TENDON;  Surgeon: Meredith Pel, MD;  Location: [facility];  Service: Orthopedics;  Laterality: Bilateral;  . SHOULDER SURGERY Right    Social History   Social History Narrative  . Not on file    Objective: Vital Signs: BP 124/86 (BP Location: Left Arm, Patient Position: Sitting, Cuff Size: Normal)   Pulse (!) 58   Resp 14   Ht 6' (1.829 m)   Wt 208 lb 3.2 oz (94.4 kg)   BMI 28.24 kg/m    Physical Exam  Constitutional: He is oriented to person, place, and time. He appears well-developed and well-nourished.  HENT:  Head: Normocephalic and atraumatic.  Eyes: Pupils are equal, round, and reactive to light. Conjunctivae and EOM are normal.  Neck: Normal range of motion. Neck supple.  Cardiovascular: Normal rate, regular rhythm and normal heart sounds.  Pulmonary/Chest: Effort normal and breath sounds normal.  Abdominal: Soft. Bowel sounds are normal.  Lymphadenopathy:    He has no cervical adenopathy.  Neurological: He is alert and oriented to person, place, and time.  Skin: Skin is warm and dry. Capillary refill takes less than 2 seconds.  Psychiatric: He has a normal mood and affect. His behavior is normal.  Nursing note and vitals reviewed.    Musculoskeletal Exam: C-spine good ROM.  Right shoulder abduction to 90 degrees and left shoulder abduction to 30 degrees.  Full ROM of both elbow joints. Wrist joints, MCPs, PIPs, and DIPs good ROM with no synovitis.  PIP and DIP synovial thickening consistent  with osteoarthritis.  Hip joints good ROM with no discomfort. Right knee full ROM with no discomfort.  Brace present on left knee.  No warmth or effusion of knee joints.  Ankle joints, MTPs, PIPs, and DIPs good ROM with no synovitis.  No tenderness or swelling of ankle joints.  No achilles tendonitis or plantar fasciitis.    CDAI Exam: CDAI Score: Not documented Patient Global Assessment: Not documented; Provider Global Assessment: Not documented Swollen: Not documented; Tender: Not documented Joint Exam   Not documented   There is currently no information documented on the homunculus. Go to the Rheumatology activity and complete the homunculus joint exam.  Investigation: No additional findings.  Imaging: Ct Abdomen Pelvis W Wo Contrast  Result Date: 09/20/2018 CLINICAL DATA:  Gross painless hematuria for a few weeks. History of prostatitis and prostate surgery. No history of malignancy. EXAM: CT ABDOMEN AND PELVIS WITHOUT AND WITH CONTRAST TECHNIQUE: Multidetector CT imaging of the abdomen and pelvis was performed following the standard protocol before and following the bolus administration of intravenous contrast. CONTRAST:  140mL ISOVUE-300 IOPAMIDOL (ISOVUE-300) INJECTION  61% COMPARISON:  CT urogram 01/28/2009. FINDINGS: Lower chest: Emphysematous changes at both lung bases. There is no significant pleural or pericardial effusion. Coronary artery atherosclerosis noted. Hepatobiliary: Pre contrast images demonstrate diffuse hepatic steatosis. Following contrast, no focal lesion or abnormal enhancement demonstrated. No evidence of gallstones, gallbladder wall thickening or biliary dilatation. Pancreas: Unremarkable. No pancreatic ductal dilatation or surrounding inflammatory changes. Spleen: Normal in size without focal abnormality. Adrenals/Urinary Tract: Both adrenal glands appear normal. Pre-contrast images demonstrate no renal, ureteral or bladder calculi. Post-contrast, both kidneys enhance  normally. There is no evidence of enhancing renal mass. Delayed images result in segmental visualization of the ureters. No focal upper tract urothelial abnormalities are identified. There is asymmetric soft tissue along the bladder base on the right, likely due to prostatic tissue. No definite bladder lesion. Stomach/Bowel: No evidence of bowel wall thickening, distention or surrounding inflammatory change. There are diverticular changes throughout the descending and sigmoid colon. The appendix appears normal. Vascular/Lymphatic: There are no enlarged abdominal or pelvic lymph nodes. Mild aortic and branch vessel atherosclerosis. Reproductive: Diminutive prostate gland consistent with previous surgery. Probable residual hypertrophy of the median lobe protruding into the bladder base on the right. The seminal vesicles appear normal. Other: No evidence of abdominal wall mass or hernia. No ascites. Musculoskeletal: No acute or significant osseous findings. There are degenerative and probable postsurgical changes in the lower lumbar spine. There are bilateral L5 pars defects with a resulting grade 1 anterolisthesis and moderate biforaminal narrowing at L5-S1. IMPRESSION: 1. Asymmetric soft tissue density along the bladder base on the right, likely median lobe hypertrophy status post reported prostate surgery. Cystoscopy should be considered for confirmation. 2. No other explanation for hematuria identified. Specifically, no evidence of urinary tract calculus or renal mass. 3. Hepatic steatosis, lumbar spondylosis with bilateral L5 pars defects, distal colonic diverticulosis and Aortic Atherosclerosis (ICD10-I70.0). Electronically Signed   By: Richardean Sale M.D.   On: 09/20/2018 08:15   Mr Knee Left Wo Contrast  Result Date: 10/02/2018 CLINICAL DATA:  Left knee pain. Slipped and fell at the gas station. EXAM: MRI OF THE LEFT KNEE WITHOUT CONTRAST TECHNIQUE: Multiplanar, multisequence MR imaging of the knee was  performed. No intravenous contrast was administered. COMPARISON:  None. FINDINGS: MENISCI Medial meniscus:  Intact. Lateral meniscus: Oblique tear of the anterior horn of the lateral meniscus extending to the superior articular surface. Fraying along the inferior articular surface of the posterior horn of the lateral meniscus. LIGAMENTS Cruciates:  Intact ACL and PCL. Collaterals: Medial collateral ligament is intact. Lateral collateral ligament complex is intact. CARTILAGE Patellofemoral: Partial-thickness cartilage loss of the medial patellofemoral compartment. Medial: Partial-thickness cartilage loss of the medial femorotibial compartment. Lateral:  No chondral defect. Joint: Small joint effusion. Normal Hoffa's fat. No plical thickening. Popliteal Fossa:  Tiny Baker cyst.  Intact popliteus tendon. Extensor Mechanism: Prior quadriceps tendon repair with the collateral sepsis tendon intact. Intact patellar tendon. Intact medial patellar retinaculum. Intact lateral patellar retinaculum. Intact MPFL. Bones:  No acute osseous abnormality.  No aggressive osseous lesion. Other: No fluid collection or hematoma. No aggressive osseous lesion. IMPRESSION: 1. Oblique tear of the anterior horn of the lateral meniscus extending to the superior articular surface. Fraying along the inferior articular surface of the posterior horn of the lateral meniscus. 2. Partial-thickness cartilage loss of the medial patellofemoral compartment. 3. Partial-thickness cartilage loss of the medial femorotibial compartment. Electronically Signed   By: Kathreen Devoid   On: 10/02/2018 09:08    Recent Labs: Lab  Results  Component Value Date   WBC 6.5 03/29/2018   HGB 13.9 03/29/2018   PLT 193 03/29/2018   NA 144 03/29/2018   K 4.7 03/29/2018   CL 108 03/29/2018   CO2 31 03/29/2018   GLUCOSE 118 (H) 03/29/2018   BUN 19 03/29/2018   CREATININE 1.01 03/29/2018   BILITOT 0.4 03/29/2018   ALKPHOS 48 06/20/2017   AST 18 03/29/2018   ALT  24 03/29/2018   PROT 5.9 (L) 03/29/2018   ALBUMIN 2.6 (L) 06/20/2017   CALCIUM 9.4 03/29/2018   GFRAA 88 03/29/2018    Speciality Comments: No specialty comments available.  Procedures:  No procedures performed Allergies: Quinolones   Assessment / Plan:     Visit Diagnoses: Idiopathic chronic gout of multiple sites without tophus -He has not had any recent gout flares. He takes Allopurinol 300 mg po daily and colchicine 0.6 mg prn. Uric acid will be checked today.  He does not need any refills of his medications at this time.  Most recent uric acid: 03/29/2018 4.8 - Plan: Uric acid  Medication monitoring encounter - CBC and CMP were checked today to monitor for drug toxicity. Plan: COMPLETE METABOLIC PANEL WITH GFR, CBC with Differential/Platelet   Primary osteoarthritis of both hands: He has PIP and DIP synovial thickening consistent with osteoarthritis.  Complete fist formation bilaterally.  No synovitis noted. Joint protection and muscle strengthening were discussed.    Primary osteoarthritis of both knees: No warmth or effusion noted. Good ROM bilaterally.  He has been wearing a left knee brace to help with stabilization.   Trigger little finger of right hand - He had a cortisone injection on 08/06/2018, which resolved his symptoms.   Trigger thumb, left thumb - He had a cortisone injection on 08/06/2018.  No triggering or thickening noted.   Quadriceps tendon rupture, left, sequela - Dr. Marlou Sa: He is wearing a left knee brace for stability.  No warmth or effusion noted.  He has been experiencing left knee buckling at times.    Rupture of quadriceps tendon, right, sequela - Dr. Marlou Sa: No warmth or effusion.  Good ROM with no discomfort.    Other medical conditions are listed as follows:   Subdural hematoma ([facility])  Polio  Neuropathy   Orders: Orders Placed This Encounter  Procedures  . Uric acid  . COMPLETE METABOLIC PANEL WITH GFR  . CBC with Differential/Platelet   No  orders of the defined types were placed in this encounter.    Follow-Up Instructions: Return in about 6 months (around 04/15/2019) for Gout, Osteoarthritis.   Ofilia Neas, PA-C  Note - This record has been created using Dragon software.  Chart creation errors have been sought, but may not always  have been located. Such creation errors do not reflect on  the standard of medical care.

## 2018-10-01 ENCOUNTER — Ambulatory Visit
Admission: RE | Admit: 2018-10-01 | Discharge: 2018-10-01 | Disposition: A | Discharge status: Home / Self Care | Payer: Medicare Other | Source: Ambulatory Visit | Attending: Orthopaedic Surgery | Admitting: Orthopaedic Surgery

## 2018-10-01 DIAGNOSIS — S83282A Other tear of lateral meniscus, current injury, left knee, initial encounter: Secondary | ICD-10-CM | POA: Diagnosis not present

## 2018-10-01 DIAGNOSIS — F438 Other reactions to severe stress: Secondary | ICD-10-CM | POA: Diagnosis not present

## 2018-10-01 DIAGNOSIS — Z4789 Encounter for other orthopedic aftercare: Secondary | ICD-10-CM

## 2018-10-01 NOTE — Telephone Encounter (Signed)
I put order in for repeat injection but he is asking for something to help with pain until he can get in to see Dr Ernestina Patches. He states that he has to work [place] all week next week. Please advise. Thanks.

## 2018-10-01 NOTE — Telephone Encounter (Signed)
You have not prescribed him anything recently. Do you want me to just send in mobic and muscle relaxer?

## 2018-10-01 NOTE — Telephone Encounter (Signed)
Ok for what he last had pls clal thx

## 2018-10-02 MED ORDER — MELOXICAM 15 MG PO TABS: ORAL_TABLET | ORAL | 0 refills | Status: DC

## 2018-10-02 MED ORDER — ACETAMINOPHEN-CODEINE #3 300-30 MG PO TABS: ORAL_TABLET | ORAL | 0 refills | Status: DC

## 2018-10-02 MED ORDER — METHOCARBAMOL 500 MG PO TABS: ORAL_TABLET | ORAL | 0 refills | Status: DC

## 2018-10-02 NOTE — Telephone Encounter (Signed)
Ok for t 3 1 po q 8  35  and mobic 15 qd  30 and mr rob 500 po q 8 # 30

## 2018-10-02 NOTE — Telephone Encounter (Signed)
T#3 called to pharmacy. Other 2 rx submitted. Patient advised done.

## 2018-10-02 NOTE — Addendum Note (Signed)
Addended by: Laurann Montana on: 10/02/2018 11:03 AM   Modules accepted: Orders

## 2018-10-07 DIAGNOSIS — M25561 Pain in right knee: Secondary | ICD-10-CM | POA: Diagnosis not present

## 2018-10-07 DIAGNOSIS — M25562 Pain in left knee: Secondary | ICD-10-CM | POA: Diagnosis not present

## 2018-10-10 ENCOUNTER — Ambulatory Visit (INDEPENDENT_AMBULATORY_CARE_PROVIDER_SITE_OTHER): Payer: Medicare Other | Admitting: Orthopedic Surgery

## 2018-10-10 DIAGNOSIS — F438 Other reactions to severe stress: Secondary | ICD-10-CM | POA: Diagnosis not present

## 2018-10-12 DIAGNOSIS — Z23 Encounter for immunization: Secondary | ICD-10-CM | POA: Diagnosis not present

## 2018-10-14 ENCOUNTER — Ambulatory Visit (INDEPENDENT_AMBULATORY_CARE_PROVIDER_SITE_OTHER): Payer: Medicare Other | Admitting: Physician Assistant

## 2018-10-14 ENCOUNTER — Ambulatory Visit (INDEPENDENT_AMBULATORY_CARE_PROVIDER_SITE_OTHER): Payer: Medicare Other | Admitting: Physical Medicine and Rehabilitation

## 2018-10-14 ENCOUNTER — Encounter: Payer: Self-pay | Admitting: Physician Assistant

## 2018-10-14 ENCOUNTER — Telehealth: Payer: Self-pay | Admitting: Rheumatology

## 2018-10-14 ENCOUNTER — Ambulatory Visit (INDEPENDENT_AMBULATORY_CARE_PROVIDER_SITE_OTHER): Payer: Self-pay

## 2018-10-14 VITALS — BP 124/86 | HR 58 | Resp 14 | Ht 72.0 in | Wt 208.2 lb

## 2018-10-14 VITALS — BP 137/88 | HR 50 | Temp 97.8°F

## 2018-10-14 DIAGNOSIS — S065XAA Traumatic subdural hemorrhage with loss of consciousness status unknown, initial encounter: Secondary | ICD-10-CM

## 2018-10-14 DIAGNOSIS — Z5181 Encounter for therapeutic drug level monitoring: Secondary | ICD-10-CM | POA: Diagnosis not present

## 2018-10-14 DIAGNOSIS — S76112S Strain of left quadriceps muscle, fascia and tendon, sequela: Secondary | ICD-10-CM

## 2018-10-14 DIAGNOSIS — M65312 Trigger thumb, left thumb: Secondary | ICD-10-CM | POA: Diagnosis not present

## 2018-10-14 DIAGNOSIS — M19042 Primary osteoarthritis, left hand: Secondary | ICD-10-CM

## 2018-10-14 DIAGNOSIS — S76111S Strain of right quadriceps muscle, fascia and tendon, sequela: Secondary | ICD-10-CM | POA: Diagnosis not present

## 2018-10-14 DIAGNOSIS — M5416 Radiculopathy, lumbar region: Secondary | ICD-10-CM | POA: Diagnosis not present

## 2018-10-14 DIAGNOSIS — M170 Bilateral primary osteoarthritis of knee: Secondary | ICD-10-CM

## 2018-10-14 DIAGNOSIS — G629 Polyneuropathy, unspecified: Secondary | ICD-10-CM | POA: Diagnosis not present

## 2018-10-14 DIAGNOSIS — M65351 Trigger finger, right little finger: Secondary | ICD-10-CM

## 2018-10-14 DIAGNOSIS — A809 Acute poliomyelitis, unspecified: Secondary | ICD-10-CM

## 2018-10-14 DIAGNOSIS — M48061 Spinal stenosis, lumbar region without neurogenic claudication: Secondary | ICD-10-CM | POA: Diagnosis not present

## 2018-10-14 DIAGNOSIS — M4316 Spondylolisthesis, lumbar region: Secondary | ICD-10-CM

## 2018-10-14 DIAGNOSIS — S065X9A Traumatic subdural hemorrhage with loss of consciousness of unspecified duration, initial encounter: Secondary | ICD-10-CM | POA: Diagnosis not present

## 2018-10-14 DIAGNOSIS — M19041 Primary osteoarthritis, right hand: Secondary | ICD-10-CM | POA: Diagnosis not present

## 2018-10-14 DIAGNOSIS — M1A09X0 Idiopathic chronic gout, multiple sites, without tophus (tophi): Secondary | ICD-10-CM | POA: Diagnosis not present

## 2018-10-14 MED ORDER — ACETAMINOPHEN-CODEINE #3 300-30 MG PO TABS: 1.0000 | ORAL_TABLET | Freq: Four times a day (QID) | ORAL | 0 refills | Status: AC | PRN

## 2018-10-14 MED ORDER — BETAMETHASONE SOD PHOS & ACET 6 (3-3) MG/ML IJ SUSP
12.0000 mg | Freq: Once | INTRAMUSCULAR | Status: AC
  Administered 2018-10-14: 12 mg

## 2018-10-14 NOTE — Patient Instructions (Signed)

## 2018-10-14 NOTE — Progress Notes (Signed)
.  ......  Numeric Pain Rating Scale and Functional Assessment Average Pain   2-10 - "not been consistent pain" In the last MONTH (on 0-10 scale) has pain interfered with the following?  1. General activity like being  able to carry out your everyday physical activities such as walking, climbing stairs, carrying groceries, or moving a chair?  Rating "From 2 - 10"  +Driver, -BT, -Dye Allergies.

## 2018-10-14 NOTE — Telephone Encounter (Signed)
Patient called requesting a referral to be sent to Dr. Posey Pronto, neurologist.  Phone [phone]

## 2018-10-15 ENCOUNTER — Telehealth: Payer: Self-pay | Admitting: *Deleted

## 2018-10-15 DIAGNOSIS — Z79899 Other long term (current) drug therapy: Secondary | ICD-10-CM

## 2018-10-15 LAB — CBC WITH DIFFERENTIAL/PLATELET
Basophils Absolute: 38 cells/uL (ref 0–200)
Basophils Relative: 0.6 %
EOS PCT: 3.1 %
Eosinophils Absolute: 198 cells/uL (ref 15–500)
HEMATOCRIT: 41.2 % (ref 38.5–50.0)
Hemoglobin: 14.1 g/dL (ref 13.2–17.1)
LYMPHS ABS: 1286 {cells}/uL (ref 850–3900)
MCH: 31.3 pg (ref 27.0–33.0)
MCHC: 34.2 g/dL (ref 32.0–36.0)
MCV: 91.4 fL (ref 80.0–100.0)
MPV: 12.2 fL (ref 7.5–12.5)
Monocytes Relative: 9.2 %
NEUTROS ABS: 4288 {cells}/uL (ref 1500–7800)
Neutrophils Relative %: 67 %
Platelets: 196 10*3/uL (ref 140–400)
RBC: 4.51 10*6/uL (ref 4.20–5.80)
RDW: 13.4 % (ref 11.0–15.0)
Total Lymphocyte: 20.1 %
WBC: 6.4 10*3/uL (ref 3.8–10.8)
WBCMIX: 589 {cells}/uL (ref 200–950)

## 2018-10-15 LAB — COMPLETE METABOLIC PANEL WITH GFR
AG RATIO: 1.8 (calc) (ref 1.0–2.5)
ALKALINE PHOSPHATASE (APISO): 55 U/L (ref 40–115)
ALT: 25 U/L (ref 9–46)
AST: 18 U/L (ref 10–35)
Albumin: 4 g/dL (ref 3.6–5.1)
BUN/Creatinine Ratio: 17 (calc) (ref 6–22)
BUN: 22 mg/dL (ref 7–25)
CO2: 29 mmol/L (ref 20–32)
CREATININE: 1.28 mg/dL — AB (ref 0.70–1.25)
Calcium: 9.6 mg/dL (ref 8.6–10.3)
Chloride: 108 mmol/L (ref 98–110)
GFR, Est African American: 66 mL/min/{1.73_m2} (ref >=60)
GFR, Est Non African American: 57 mL/min/{1.73_m2} — ABNORMAL LOW (ref >=60)
Globulin: 2.2 g/dL (calc) (ref 1.9–3.7)
Glucose, Bld: 71 mg/dL (ref 65–99)
POTASSIUM: 4.8 mmol/L (ref 3.5–5.3)
SODIUM: 143 mmol/L (ref 135–146)
Total Bilirubin: 0.5 mg/dL (ref 0.2–1.2)
Total Protein: 6.2 g/dL (ref 6.1–8.1)

## 2018-10-15 LAB — URIC ACID: URIC ACID, SERUM: 4.8 mg/dL (ref 4.0–8.0)

## 2018-10-15 NOTE — Telephone Encounter (Signed)
-----   Message from Ofilia Neas, PA-C sent at 10/15/2018 10:31 AM EDT ----- Uric acid within desirable range.  Creatinine is elevated and GFR borderline low.  Please advise patient to avoid taking Mobic at this time.  Dr. Estanislado Pandy would like the patient to return in 46 month for CMP. CBC WNL.

## 2018-10-15 NOTE — Progress Notes (Signed)
Patient ID: Jose [name], male   DOB: 06-19-1951, 67 y.o.   MRN: [id number]     Cardiology Office Note   Date:  10/17/2018   ID:  Jose [name], DOB [birth year], MRN [id number]  PCP:  Jose Spurling, MD  Cardiologist:   Jose Rouge, MD   No chief complaint on file.     History of Present Illness: Jose [name] is a 67 y.o. male who presents for f/u vascular disease and bicuspid AV with moderate AS   Followed by Jose [name] for ascending aortic dilatations.  Reviewed CTA 09/12/18  and measured stable 4.5 cm  The patient has a history of intracranial bleed/ subdural hematoma without history of trauma treated in [place]. He underwent right craniotomy. 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 There is no family history of aortic aneurysms, unexplained sudden death at an early age or aortic dissection.He has one cousin who died in his 45s of sudden collapse thought to be a brain aneurysm  TTE done 02/18/18 with moderate AS some progression of gradients with mean 30 peak 58 mmHg  Had a nonischemic myovue 02/21/17 EF estimated 46% but low normal on echo .  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 Arthritis    R shoulder, great toes, hands- has gout & has injections in knees with Jose. Estanislado [name]   . Cancer ([facility])    skin ca-   . GERD (gastroesophageal reflux disease)   . History of hiatal hernia   . SDH (subdural hematoma) (HCC)     Past Surgical  History:  Procedure Laterality Date  . BRAIN SURGERY  2016   evacuation of SDH  . CARPAL TUNNEL RELEASE Bilateral   . GREEN LIGHT LASER TURP (TRANSURETHRAL RESECTION OF PROSTATE  04/27/2017  . KNEE ARTHROPLASTY    . QUADRICEPS TENDON REPAIR Bilateral 06/15/2017   Procedure: REPAIR QUADRICEP TENDON;  Surgeon: Jose Pel, MD;  Location: [facility];  Service: Orthopedics;  Laterality: Bilateral;  . SHOULDER SURGERY Right      Current Outpatient Medications  Medication Sig Dispense Refill  . acetaminophen (TYLENOL) 500 MG tablet Take 500-1,000 mg by mouth every 6 (six) hours as needed for mild pain.    Marland Kitchen acetaminophen-codeine (TYLENOL #3) 300-30 MG tablet Take 1 tablet by mouth every 6 (six) hours as needed for up to 5 days for moderate pain. 20 tablet 0  . allopurinol (ZYLOPRIM) 300 MG tablet Take 1 tablet (300 mg total) by mouth daily. 90 tablet 1  . diclofenac sodium (VOLTAREN) 1 % GEL Apply to left knee prn pain daily 3 Tube 1  . esomeprazole (NEXIUM) 40 MG capsule Take 1 capsule (40 mg total) by mouth daily. 30 capsule 0  . metoprolol succinate (TOPROL-XL) 25 MG 24 hr tablet TAKE 1 TABLET (25 MG TOTAL) BY MOUTH DAILY. TAKE WITH OR IMMEDIATELY FOLLOWING A MEAL. 90 tablet 3  .  Multiple Vitamin (MULTIVITAMIN WITH MINERALS) TABS tablet Take 1 tablet by mouth daily.    Marland Kitchen UNABLE TO FIND 1 tablet as needed. Med Name: Col-quit     No current facility-administered medications for this visit.     Allergies:   Quinolones    Social History:  The patient  reports that he has never smoked. He has never used smokeless tobacco. He reports that he drinks about 8.0 standard drinks of alcohol per week. He reports that he does not use drugs.   Family History:  The patient's family history includes Diabetes in his brother; Heart disease in his father; Parkinson's disease in his brother and mother.    ROS:  Please see the history of present illness.   Otherwise, review of systems are positive for  none.   All other systems are reviewed and negative.    PHYSICAL EXAM: VS:  BP 124/84   Pulse (!) 58   Ht 6' (1.829 m)   Wt 206 lb 8 oz (93.7 kg)   SpO2 97%   BMI 28.01 kg/m  , BMI Body mass index is 28.01 kg/m. Affect appropriate Healthy:  appears stated age 59: normal Neck supple with no adenopathy JVP normal no bruits no thyromegaly Lungs clear with no wheezing and good diaphragmatic motion Heart:  S1/S2 spplit SEM murmur, no rub, gallop or click PMI normal Abdomen: benighn, BS positve, no tenderness, no AAA no bruit.  No HSM or HJR Distal pulses intact with no bruits No edema Neuro non-focal Skin warm and dry Post bilateral knee replacements with brace on left still     EKG:  10/17/18 SR rate 58 normal    Recent Labs: 10/14/2018: ALT 25; BUN 22; Creat 1.28; Hemoglobin 14.1; Platelets 196; Potassium 4.8; Sodium 143    Lipid Panel    Component Value Date/Time   CHOL 243 (HH) 12/25/2007 1636   TRIG 126 12/25/2007 1636   HDL 66.4 12/25/2007 1636   CHOLHDL 3.7 CALC 12/25/2007 1636   VLDL 25 12/25/2007 1636   LDLDIRECT 164.4 12/25/2007 1636      Wt Readings from Last 3 Encounters:  10/17/18 206 lb 8 oz (93.7 kg)  10/14/18 208 lb 3.2 oz (94.4 kg)  09/12/18 202 lb (91.6 kg)      Other studies Reviewed: Additional studies/ records that were reviewed today include:  Epic notes CTA, ECG Notes from Jose [name] see HPI. TTE done 02/18/18   ASSESSMENT AND PLAN:  1.  Aortic Aneurysm:  Now in context of bicuspid valve will need to be followed closer per Jose [name] 4.5 cm CTA 09/12/18  stable  Continue Toprol 25 mg daily  2. Subdural:  Spontaneous avoid antiplatelet agents follow vertebral artery with ? Duplex per neuro 3. Family history CAD.  Non ischemic myovue 02/21/17 can image coronary arteries with cardiac CTA at time of aneurysm imaging  4. Bicuspid AV:  Moderate AS mean gradient 30 mmHg peak 53 mmHg TTE done 02/18/18  f/u echo March 2020    Jose [name]

## 2018-10-15 NOTE — Telephone Encounter (Signed)
yes

## 2018-10-15 NOTE — Telephone Encounter (Signed)
Patient states he needs a referral to Dr. Posey Pronto for neuropathy. Okay to place referral?

## 2018-10-15 NOTE — Telephone Encounter (Signed)
Referral placed.

## 2018-10-15 NOTE — Progress Notes (Signed)
Uric acid within desirable range.  Creatinine is elevated and GFR borderline low.  Please advise patient to avoid taking Mobic at this time.  Dr. Estanislado Pandy would like the patient to return in 80 month for CMP. CBC WNL.

## 2018-10-16 ENCOUNTER — Encounter (INDEPENDENT_AMBULATORY_CARE_PROVIDER_SITE_OTHER): Payer: Self-pay | Admitting: Orthopedic Surgery

## 2018-10-16 ENCOUNTER — Ambulatory Visit (INDEPENDENT_AMBULATORY_CARE_PROVIDER_SITE_OTHER): Payer: Medicare Other | Admitting: Orthopedic Surgery

## 2018-10-16 ENCOUNTER — Encounter (INDEPENDENT_AMBULATORY_CARE_PROVIDER_SITE_OTHER): Payer: Self-pay | Admitting: Physical Medicine and Rehabilitation

## 2018-10-16 DIAGNOSIS — M25562 Pain in left knee: Secondary | ICD-10-CM

## 2018-10-16 NOTE — Progress Notes (Signed)
Office Visit Note   Patient: Jose [name]           Date of Birth: 30-May-1951           MRN: [id number] Visit Date: 10/16/2018 Requested by: Jose Spurling, MD [address] [place], [place] [address] PCP: Jose Spurling, MD  Subjective: Chief Complaint  Patient presents with  . Knee Pain    bilateral    HPI: Jose [name] is a patient with bilateral knee pain left worse than right.  He has had bilateral quad rupture repair.  The left was chronic the right was acute.  The left one has gone on to slowly pull away.  He still has full extension but does report some giving way.  Went for second opinion in [place] who agreed that reconstruction with graft interposition is indicated.  He does do exercises.  Brace does help the left knee.  He also has a thoracic aneurysm which is being followed.              ROS: All systems reviewed are negative as they relate to the chief complaint within the history of present illness.  Patient denies  fevers or chills.   Assessment & Plan: Visit Diagnoses:  1. Pain in joint of left knee     Plan: Impression is well-functioning right quad tendon repair.  The left one is not is well-functioning.  He does have retraction of at least part is not most of that quadriceps tendon rupture repair.  In general we have been following this and is never been a great time for him to consider surgical salvage surgery to try to bridge the gap in that defect.  Plan at this time is to consider surgical treatment with potential allograft interposition in the spring.  That really the first time that he has available that he would want to do that.  I think he still is functional with that left leg.  Nonetheless I would like to reestablish some type of soft tissue connection between that part of the quad that is retracted in his patella.  Follow-Up Instructions: No follow-ups on file.   Orders:  No orders of the defined types were placed in this encounter.  No  orders of the defined types were placed in this encounter.     Procedures: No procedures performed   Clinical Data: No additional findings.  Objective: Vital Signs: There were no vitals taken for this visit.  Physical Exam:   Constitutional: Patient appears well-developed HEENT:  Head: Normocephalic Eyes:EOM are normal Neck: Normal range of motion Cardiovascular: Normal rate Pulmonary/chest: Effort normal Neurologic: Patient is alert Skin: Skin is warm Psychiatric: Patient has normal mood and affect    Ortho Exam: Ortho exam demonstrates full extension with both legs.  No effusion in the left knee.  Does have a palpable defect at the superior pole of the patella consistent with retraction of previously repaired quad tendon.  That retraction is about 2-1/2 to 3 cm.  Collateral and cruciates are stable.  No joint line tenderness is present.  Specialty Comments:  No specialty comments available.  Imaging: No results found.   PMFS History: Patient Active Problem List   Diagnosis Date Noted  . Candidiasis   . Abnormality of gait   . Hypoalbuminemia due to protein-calorie malnutrition ([facility])   . History of gout   . Sleep disturbance   . Constipation   . Rupture of quadriceps tendon, right, sequela 06/19/2017  . Quadriceps tendon  rupture, left, sequela 06/19/2017  . Acute blood loss anemia 06/19/2017  . Fall   . History of subdural hematoma   . Post-operative pain   . Recurrent falls   . Quadriceps tendon rupture 06/15/2017  . Primary osteoarthritis of both knees 02/28/2017  . Primary osteoarthritis of both hands 02/28/2017  . Uricacidemia 02/28/2017  . Pain in joint of left knee 02/07/2017  . Idiopathic chronic gout, unspecified site, without tophus (tophi) 11/29/2016  . HEMATURIA UNSPECIFIED 01/25/2009  . ABDOMINAL PAIN 01/25/2009  . XERODERMA 03/10/2008  . UNS ADVRS EFF UNS RX MEDICINAL&BIOLOGICAL SBSTNC 03/10/2008  . ADJUSTMENT REACTION WITH PHYSICAL  SYMPTOMS 02/14/2008  . MUSCLE SPASM 02/14/2008  . ACUTE PROSTATITIS 12/16/2007  . BREAST LUMP OR MASS, RIGHT 07/12/2007  . H/O: RCT (rotator cuff tear) 01/13/2007  . GOUT 01/08/2007   Past Medical History:  Diagnosis Date  . Arthritis    R shoulder, great toes, hands- has gout & has injections in knees with Dr. Estanislado Pandy   . Cancer ([facility])    skin ca-   . GERD (gastroesophageal reflux disease)   . History of hiatal hernia   . SDH (subdural hematoma) (HCC)     Family History  Problem Relation Age of Onset  . Parkinson's disease Mother   . Heart disease Father   . Parkinson's disease Brother   . Diabetes Brother     Past Surgical History:  Procedure Laterality Date  . BRAIN SURGERY  2016   evacuation of SDH  . CARPAL TUNNEL RELEASE Bilateral   . GREEN LIGHT LASER TURP (TRANSURETHRAL RESECTION OF PROSTATE  04/27/2017  . KNEE ARTHROPLASTY    . QUADRICEPS TENDON REPAIR Bilateral 06/15/2017   Procedure: REPAIR QUADRICEP TENDON;  Surgeon: Meredith Pel, MD;  Location: [facility];  Service: Orthopedics;  Laterality: Bilateral;  . SHOULDER SURGERY Right    Social History   Occupational History  . Not on file  Tobacco Use  . Smoking status: Never Smoker  . Smokeless tobacco: Never Used  Substance and Sexual Activity  . Alcohol use: Yes    Alcohol/week: 8.0 standard drinks    Types: 1 Shots of liquor, 7 Glasses of wine per week    Comment: 2 drinks per day  . Drug use: No  . Sexual activity: Not on file

## 2018-10-17 ENCOUNTER — Ambulatory Visit (INDEPENDENT_AMBULATORY_CARE_PROVIDER_SITE_OTHER): Payer: Medicare Other | Admitting: Cardiovascular Disease

## 2018-10-17 ENCOUNTER — Encounter: Payer: Self-pay | Admitting: Cardiovascular Disease

## 2018-10-17 VITALS — BP 124/84 | HR 58 | Ht 72.0 in | Wt 206.5 lb

## 2018-10-17 DIAGNOSIS — Q231 Congenital insufficiency of aortic valve: Secondary | ICD-10-CM

## 2018-10-17 DIAGNOSIS — I719 Aortic aneurysm of unspecified site, without rupture: Secondary | ICD-10-CM

## 2018-10-17 DIAGNOSIS — Z8249 Family history of ischemic heart disease and other diseases of the circulatory system: Secondary | ICD-10-CM | POA: Diagnosis not present

## 2018-10-17 DIAGNOSIS — Q230 Congenital stenosis of aortic valve: Secondary | ICD-10-CM

## 2018-10-17 NOTE — Patient Instructions (Addendum)
Medication Instructions:   If you need a refill on your cardiac medications before your next appointment, please call your pharmacy.   Lab work:  If you have labs (blood work) drawn today and your tests are completely normal, you will receive your results only by: Marland Kitchen MyChart Message (if you have MyChart) OR . A paper copy in the mail If you have any lab test that is abnormal or we need to change your treatment, we will call you to review the results.  Testing/Procedures: Your physician has requested that you have an echocardiogram in March. Echocardiography is a painless test that uses sound waves to create images of your heart. It provides your doctor with information about the size and shape of your heart and how well your heart's chambers and valves are working. This procedure takes approximately one hour. There are no restrictions for this procedure.  Follow-Up: At [facility], you and your health needs are our priority.  As part of our continuing mission to provide you with exceptional heart care, we have created designated Provider Care Teams.  These Care Teams include your primary Cardiologist (physician) and Advanced Practice Providers (APPs -  Physician Assistants and Nurse Practitioners) who all work together to provide you with the care you need, when you need it. You will need a follow up appointment in March 2020.   You may see [name], MD or one of the following Advanced Practice Providers on your designated Care Team:   Truitt Merle, NP Cecilie Kicks, NP . Kathyrn Drown, NP

## 2018-10-20 ENCOUNTER — Encounter: Payer: Self-pay | Admitting: Rheumatology

## 2018-10-22 DIAGNOSIS — F438 Other reactions to severe stress: Secondary | ICD-10-CM | POA: Diagnosis not present

## 2018-10-23 NOTE — Progress Notes (Signed)
Jose [name] - 67 y.o. male MRN [id number]  Date of birth: 11-05-1951  Office Visit Note: Visit Date: 10/14/2018 PCP: [name], MD Referred by: [name], MD  Subjective: Chief Complaint  Patient presents with  . Spine - Pain, Follow-up  . Left Hip - Pain  . Left Leg - Numbness  . Lower Back - Pain   HPI: Jose [name] is a 67 y.o. male who comes in today For reevaluation and management of his low back and radicular type pain.  He is followed extensively by Dr. Anderson Malta.  He has a history of gout as well as bilateral severe knee arthritis and a history of quadriceps tendon rupture.  I last saw him and completed a right L3 and L4 transforaminal injection which is really relieved his right-sided symptoms quite nicely.  He had a fairly significant subarticular and extraforaminal disc protrusion extrusion at L3-4.  At the time that we are evaluating we also noted he had a bilateral pars defect at L5-S1 with listhesis and foraminal narrowing particularly on the left but he is having no symptoms of that.  We did complete the injection he did quite well.  He still tries to maintain good activity level.  Today he describes worsening pain across the lower back but predominantly on the left side as far as his back pain and referring symptoms down the left leg with some numbness tingling in the left foot and a predominantly L5 distribution.  He denies any real right sided complaints down the leg gets some right-sided low back pain.  No hip or groin pain internally and medially.  He does have worsening symptoms with standing for any length of time.  He says his pain is not really consistent as it can come and go at times.  He did recently work the [employer] and was on his feet quite a bit and this gave him a lot of pain.  He has not noted any focal weakness or bowel or bladder changes or any other red flag complaints.  He has had no specific new trauma.  MRI reviewed again with the  patient today.  Review of Systems  Constitutional: Negative for chills, fever, malaise/fatigue and weight loss.  HENT: Negative for hearing loss and sinus pain.   Eyes: Negative for blurred vision, double vision and photophobia.  Respiratory: Negative for cough and shortness of breath.   Cardiovascular: Negative for chest pain, palpitations and leg swelling.  Gastrointestinal: Negative for abdominal pain, nausea and vomiting.  Genitourinary: Negative for flank pain.  Musculoskeletal: Positive for back pain and joint pain. Negative for myalgias.  Skin: Negative for itching and rash.  Neurological: Positive for tingling. Negative for tremors, focal weakness and weakness.  Endo/Heme/Allergies: Negative.   Psychiatric/Behavioral: Negative for depression.  All other systems reviewed and are negative.  Otherwise per HPI.  Assessment & Plan: Visit Diagnoses:  1. Lumbar radiculopathy   2. Spondylolisthesis of lumbar region   3. Foraminal stenosis of lumbar region     Plan: Findings:  Orthopedically complicated patient with history of severe knee osteoarthritis and knee replacement along with history of quadriceps tendon rupture and gout along with degenerative lumbar spine with known disc herniation at L3-4 more with right-sided anatomical issues and then with pars defect at L5-S1 with grade 1 listhesis and foraminal narrowing left more than right.  His pain is still multifactorial but I do think the left-sided low back and hip and leg pain is related  to the pars defect and foraminal narrowing.  He could have a small disc or something change since last time we saw him but I think it still would be treated the same way.  Diagnostically he needs a left L5 transforaminal injection to see if it helps and if it helps a lot that may be all he needs therapeutically.  This would be done with fluoroscopic guidance.  We talked in detail about stretching of the hamstrings as well as the lower back given the  pars defects.  He has some challenges with stretching given his orthopedic complaints.  We also talked about activity modification and the fact that he is not going to be standing is much [place] market is over may help him.  He will continue with current medications.  He was given small amount of Tylenol 3 by Dr. Marlou Sa prior to furniture market.  He is asking for refill.  I did give him a small quantity of this until the injection hopefully can work.  He will continue to follow-up with Dr. Marlou Sa concerning medication management.    Meds & Orders:  Meds ordered this encounter  Medications  . betamethasone acetate-betamethasone sodium phosphate (CELESTONE) injection 12 mg  . acetaminophen-codeine (TYLENOL #3) 300-30 MG tablet    Sig: Take 1 tablet by mouth every 6 (six) hours as needed for up to 5 days for moderate pain.    Dispense:  20 tablet    Refill:  0    Orders Placed This Encounter  Procedures  . XR C-ARM NO REPORT  . Epidural Steroid injection    Follow-up: Return if symptoms worsen or fail to improve.   Procedures: No procedures performed  Lumbosacral Transforaminal Epidural Steroid Injection - Sub-Pedicular Approach with Fluoroscopic Guidance  Patient: Jose [name]      Date of Birth: [birth year] MRN: [id number] PCP: [name], MD      Visit Date: 10/14/2018   Universal Protocol:    Date/Time: 10/14/2018  Consent Given By: the patient  Position: PRONE  Additional Comments: Vital signs were monitored before and after the procedure. Patient was prepped and draped in the usual sterile fashion. The correct patient, procedure, and site was verified.   Injection Procedure Details:  Procedure Site One Meds Administered:  Meds ordered this encounter  Medications  . betamethasone acetate-betamethasone sodium phosphate (CELESTONE) injection 12 mg  . acetaminophen-codeine (TYLENOL #3) 300-30 MG tablet    Sig: Take 1 tablet by mouth every 6 (six) hours as  needed for up to 5 days for moderate pain.    Dispense:  20 tablet    Refill:  0    Laterality: Left  Location/Site:  L5-S1  Needle size: 22 G  Needle type: Spinal  Needle Placement: Transforaminal  Findings:    -Comments: Excellent flow of contrast along the nerve and into the epidural space.  Procedure Details: After squaring off the end-plates to get a true AP view, the C-arm was positioned so that an oblique view of the foramen as noted above was visualized. The target area is just inferior to the "nose of the scotty dog" or sub pedicular. The soft tissues overlying this structure were infiltrated with 2-3 ml. of 1% Lidocaine without Epinephrine.  The spinal needle was inserted toward the target using a "trajectory" view along the fluoroscope beam.  Under AP and lateral visualization, the needle was advanced so it did not puncture dura and was located close the 6 O'Clock position of the pedical in  AP tracterory. Biplanar projections were used to confirm position. Aspiration was confirmed to be negative for CSF and/or blood. A 1-2 ml. volume of Isovue-250 was injected and flow of contrast was noted at each level. Radiographs were obtained for documentation purposes.   After attaining the desired flow of contrast documented above, a 0.5 to 1.0 ml test dose of 0.25% Marcaine was injected into each respective transforaminal space.  The patient was observed for 90 seconds post injection.  After no sensory deficits were reported, and normal lower extremity motor function was noted,   the above injectate was administered so that equal amounts of the injectate were placed at each foramen (level) into the transforaminal epidural space.   Additional Comments:  The patient tolerated the procedure well Dressing: Band-Aid    Post-procedure details: Patient was observed during the procedure. Post-procedure instructions were reviewed.  Patient left the clinic in stable condition.      Clinical History: MRI LUMBAR SPINE WITHOUT CONTRAST  TECHNIQUE: Multiplanar, multisequence MR imaging of the lumbar spine was performed. No intravenous contrast was administered.  COMPARISON:  Lumbar spine radiographs February 12, 2018  FINDINGS: SEGMENTATION: For the purposes of this report, the last well-formed intervertebral disc is reported as L5-S1.  ALIGNMENT: Maintained lumbar lordosis. Grade 1 L5-S1 anterolisthesis. Chronic bilateral L5 pars interarticularis defects.  VERTEBRAE:Vertebral bodies are intact. Severe L4-5 and L5-S1 disc height loss with moderate to severe subacute discogenic endplate changes L3-8. Moderate acute discogenic endplate changes B0-F7. Mild acute discogenic endplate changes P1-0. Multilevel mild disc desiccation and mild subacute on chronic discogenic endplate changes. Scattered small Schmorl's nodes severe in a gist.  CONUS MEDULLARIS AND CAUDA EQUINA: Conus medullaris terminates at L1 and demonstrates normal morphology and signal characteristics. Cauda equina is normal.  PARASPINAL AND OTHER SOFT TISSUES: Nonacute. 8 mm T2 bright cyst upper pole LEFT kidney.  DISC LEVELS:  T11-12 and T12-L1: Annular bulging without canal stenosis nor neural foraminal narrowing.  L1-2, L2-3: No disc bulge, canal stenosis nor neural foraminal narrowing. Mild LEFT L2-3 facet arthropathy.  L3-4: Large RIGHT subarticular to extraforaminal disc extrusion, difficult to quantify due to slice selection with surrounding inflammatory changes. Potential free fragment along the superior RIGHT extraforaminal space. Posteriorly displaced exited RIGHT L3 nerve. Partially effaced RIGHT lateral recess posteriorly displacing the traversing RIGHT L4 nerve. Mild facet arthropathy and ligamentum flavum redundancy without canal stenosis. Moderate RIGHT neural foraminal narrowing with extruded disc within superior foramen. No LEFT neural foraminal  narrowing.  L4-5: Small broad-based disc osteophyte complex asymmetric to the RIGHT. Moderate facet arthropathy and ligamentum flavum redundancy without canal stenosis. Mild to moderate bilateral neural foraminal narrowing.  L5-S1: Anterolisthesis. Unroofing of the disc with annular bulging. Minimal facet arthropathy without canal stenosis. Moderate RIGHT, severe LEFT neural foraminal narrowing.  IMPRESSION: 1. Large RIGHT L3-4 subarticular two extraforaminal disc extrusion with suspected extraforaminal free fragment. Exited RIGHT L3 and traversing RIGHT L4 nerve impingement. 2. Grade 1 L5-S1 anterolisthesis on the basis of chronic bilateral L5 pars interarticularis defects. 3. No canal stenosis. Neural foraminal narrowing L3-4 through L5-S1: Severe on the LEFT at L5-S1.   Electronically Signed   By: Elon Alas M.D.   On: 02/22/2018 20:33   He reports that he has never smoked. He has never used smokeless tobacco.  Recent Labs    03/29/18 0904 10/14/18 0833  LABURIC 4.8 4.8    Objective:  VS:  HT:    WT:   BMI:     BP:137/88  HR:(!) 50bpm  TEMP:97.8 F (36.6 C)( )  RESP:100 % Physical Exam  Constitutional: He is oriented to person, place, and time. He appears well-developed and well-nourished. No distress.  HENT:  Head: Normocephalic and atraumatic.  Eyes: Pupils are equal, round, and reactive to light. Conjunctivae are normal.  Neck: Neck supple. No JVD present. No tracheal deviation present.  Cardiovascular: Regular rhythm and intact distal pulses.  Pulmonary/Chest: Effort normal. No respiratory distress.  Musculoskeletal: He exhibits no edema.  Patient ambulates without aid with an antalgic gait to the left.  Visual examination shows significant changes of the left knee status post surgery replacement.  He does stand with a forward flexed lumbar spine he has pain with extension and concordant pain with facet loading.  He has a positive slump test on the  left and not the right.  He has no pain with hip rotation.  Neurological: He is alert and oriented to person, place, and time. He exhibits normal muscle tone. Coordination normal.  Skin: Skin is warm and dry. No rash noted. No erythema.  Psychiatric: He has a normal mood and affect.  Nursing note and vitals reviewed.   Ortho Exam Imaging: No results found.  Past Medical/Family/Surgical/Social History: Medications & Allergies reviewed per EMR, new medications updated. Patient Active Problem List   Diagnosis Date Noted  . Candidiasis   . Abnormality of gait   . Hypoalbuminemia due to protein-calorie malnutrition ([facility])   . History of gout   . Sleep disturbance   . Constipation   . Rupture of quadriceps tendon, right, sequela 06/19/2017  . Quadriceps tendon rupture, left, sequela 06/19/2017  . Acute blood loss anemia 06/19/2017  . Fall   . History of subdural hematoma   . Post-operative pain   . Recurrent falls   . Quadriceps tendon rupture 06/15/2017  . Primary osteoarthritis of both knees 02/28/2017  . Primary osteoarthritis of both hands 02/28/2017  . Uricacidemia 02/28/2017  . Pain in joint of left knee 02/07/2017  . Idiopathic chronic gout, unspecified site, without tophus (tophi) 11/29/2016  . HEMATURIA UNSPECIFIED 01/25/2009  . ABDOMINAL PAIN 01/25/2009  . XERODERMA 03/10/2008  . UNS ADVRS EFF UNS RX MEDICINAL&BIOLOGICAL SBSTNC 03/10/2008  . ADJUSTMENT REACTION WITH PHYSICAL SYMPTOMS 02/14/2008  . MUSCLE SPASM 02/14/2008  . ACUTE PROSTATITIS 12/16/2007  . BREAST LUMP OR MASS, RIGHT 07/12/2007  . H/O: RCT (rotator cuff tear) 01/13/2007  . GOUT 01/08/2007   Past Medical History:  Diagnosis Date  . Arthritis    R shoulder, great toes, hands- has gout & has injections in knees with Dr. Estanislado Pandy   . Cancer ([place])    skin ca-   . GERD (gastroesophageal reflux disease)   . History of hiatal hernia   . SDH (subdural hematoma) ([facility])    Family History  Problem Relation  Age of Onset  . Parkinson's disease Mother   . Heart disease Father   . Parkinson's disease Brother   . Diabetes Brother    Past Surgical History:  Procedure Laterality Date  . BRAIN SURGERY  2016   evacuation of SDH  . CARPAL TUNNEL RELEASE Bilateral   . GREEN LIGHT LASER TURP (TRANSURETHRAL RESECTION OF PROSTATE  04/27/2017  . KNEE ARTHROPLASTY    . QUADRICEPS TENDON REPAIR Bilateral 06/15/2017   Procedure: REPAIR QUADRICEP TENDON;  Surgeon: Meredith Pel, MD;  Location: [facility];  Service: Orthopedics;  Laterality: Bilateral;  . SHOULDER SURGERY Right    Social History   Occupational History  . Not on file  Tobacco Use  . Smoking status: Never Smoker  . Smokeless tobacco: Never Used  Substance and Sexual Activity  . Alcohol use: Yes    Alcohol/week: 8.0 standard drinks    Types: 1 Shots of liquor, 7 Glasses of wine per week    Comment: 2 drinks per day  . Drug use: No  . Sexual activity: Not on file

## 2018-10-23 NOTE — Procedures (Signed)
Lumbosacral Transforaminal Epidural Steroid Injection - Sub-Pedicular Approach with Fluoroscopic Guidance  Patient: Jose [name]      Date of Birth: 1951/09/19 MRN: [id number] PCP: [name], MD      Visit Date: 10/14/2018   Universal Protocol:    Date/Time: 10/14/2018  Consent Given By: the patient  Position: PRONE  Additional Comments: Vital signs were monitored before and after the procedure. Patient was prepped and draped in the usual sterile fashion. The correct patient, procedure, and site was verified.   Injection Procedure Details:  Procedure Site One Meds Administered:  Meds ordered this encounter  Medications  . betamethasone acetate-betamethasone sodium phosphate (CELESTONE) injection 12 mg  . acetaminophen-codeine (TYLENOL #3) 300-30 MG tablet    Sig: Take 1 tablet by mouth every 6 (six) hours as needed for up to 5 days for moderate pain.    Dispense:  20 tablet    Refill:  0    Laterality: Left  Location/Site:  L5-S1  Needle size: 22 G  Needle type: Spinal  Needle Placement: Transforaminal  Findings:    -Comments: Excellent flow of contrast along the nerve and into the epidural space.  Procedure Details: After squaring off the end-plates to get a true AP view, the C-arm was positioned so that an oblique view of the foramen as noted above was visualized. The target area is just inferior to the "nose of the scotty dog" or sub pedicular. The soft tissues overlying this structure were infiltrated with 2-3 ml. of 1% Lidocaine without Epinephrine.  The spinal needle was inserted toward the target using a "trajectory" view along the fluoroscope beam.  Under AP and lateral visualization, the needle was advanced so it did not puncture dura and was located close the 6 O'Clock position of the pedical in AP tracterory. Biplanar projections were used to confirm position. Aspiration was confirmed to be negative for CSF and/or blood. A 1-2 ml. volume of  Isovue-250 was injected and flow of contrast was noted at each level. Radiographs were obtained for documentation purposes.   After attaining the desired flow of contrast documented above, a 0.5 to 1.0 ml test dose of 0.25% Marcaine was injected into each respective transforaminal space.  The patient was observed for 90 seconds post injection.  After no sensory deficits were reported, and normal lower extremity motor function was noted,   the above injectate was administered so that equal amounts of the injectate were placed at each foramen (level) into the transforaminal epidural space.   Additional Comments:  The patient tolerated the procedure well Dressing: Band-Aid    Post-procedure details: Patient was observed during the procedure. Post-procedure instructions were reviewed.  Patient left the clinic in stable condition.

## 2018-10-28 ENCOUNTER — Telehealth: Payer: Self-pay | Admitting: Rheumatology

## 2018-10-28 MED ORDER — PREDNISONE 5 MG PO TABS: ORAL_TABLET | ORAL | 0 refills | Status: DC

## 2018-10-28 NOTE — Telephone Encounter (Signed)
Patient states he started to have a flare of his gout in his right foot at his ankle on 11/8/1. Patient states he started the [place]. Patient states the [place] has not even helped with the flare. Patient states it is interfering with his sleep. Patient states he is out of town for work and has a busy schedule this week and will be on his feet a lot this week. Patient states he has tried tylenol and Ibuprofen with no relief. Please advise.

## 2018-10-28 NOTE — Telephone Encounter (Signed)
Ok to give Pred taper , starting at 20 mg and taper by 5 mg q 2 days.

## 2018-10-28 NOTE — Telephone Encounter (Signed)
Patient called stating his right foot is swollen and extremely painful.  Patient states he is taking his medications and requesting a return call.  Patient is unable to make an appointment at this time because he is out of town.

## 2018-10-28 NOTE — Telephone Encounter (Signed)
Patient advised prescription sent to the pharmacy.  

## 2018-11-05 DIAGNOSIS — F438 Other reactions to severe stress: Secondary | ICD-10-CM | POA: Diagnosis not present

## 2018-11-10 ENCOUNTER — Other Ambulatory Visit (INDEPENDENT_AMBULATORY_CARE_PROVIDER_SITE_OTHER): Payer: Self-pay | Admitting: Orthopedic Surgery

## 2018-11-11 NOTE — Telephone Encounter (Signed)
y

## 2018-11-11 NOTE — Telephone Encounter (Signed)
Ok to refill 

## 2018-11-12 ENCOUNTER — Other Ambulatory Visit: Payer: Self-pay

## 2018-11-12 DIAGNOSIS — Z79899 Other long term (current) drug therapy: Secondary | ICD-10-CM

## 2018-11-12 DIAGNOSIS — F438 Other reactions to severe stress: Secondary | ICD-10-CM | POA: Diagnosis not present

## 2018-11-12 LAB — COMPLETE METABOLIC PANEL WITH GFR
AG RATIO: 2 (calc) (ref 1.0–2.5)
ALBUMIN MSPROF: 4.4 g/dL (ref 3.6–5.1)
ALT: 37 U/L (ref 9–46)
AST: 25 U/L (ref 10–35)
Alkaline phosphatase (APISO): 63 U/L (ref 40–115)
BUN: 19 mg/dL (ref 7–25)
CALCIUM: 9.5 mg/dL (ref 8.6–10.3)
CO2: 29 mmol/L (ref 20–32)
CREATININE: 1.09 mg/dL (ref 0.70–1.25)
Chloride: 106 mmol/L (ref 98–110)
GFR, EST AFRICAN AMERICAN: 80 mL/min/{1.73_m2} (ref >=60)
GFR, EST NON AFRICAN AMERICAN: 69 mL/min/{1.73_m2} (ref >=60)
GLOBULIN: 2.2 g/dL (ref 1.9–3.7)
Glucose, Bld: 84 mg/dL (ref 65–99)
POTASSIUM: 4.2 mmol/L (ref 3.5–5.3)
SODIUM: 142 mmol/L (ref 135–146)
TOTAL PROTEIN: 6.6 g/dL (ref 6.1–8.1)
Total Bilirubin: 0.6 mg/dL (ref 0.2–1.2)

## 2018-11-14 ENCOUNTER — Other Ambulatory Visit (INDEPENDENT_AMBULATORY_CARE_PROVIDER_SITE_OTHER): Payer: Self-pay | Admitting: Orthopedic Surgery

## 2018-11-18 MED ORDER — DICLOFENAC SODIUM 1 % TD GEL: 2.0000 g | Freq: Four times a day (QID) | TRANSDERMAL | 2 refills | Status: DC

## 2018-11-19 DIAGNOSIS — F438 Other reactions to severe stress: Secondary | ICD-10-CM | POA: Diagnosis not present

## 2018-11-21 ENCOUNTER — Ambulatory Visit (INDEPENDENT_AMBULATORY_CARE_PROVIDER_SITE_OTHER): Payer: Medicare Other | Admitting: Rheumatology

## 2018-11-21 ENCOUNTER — Encounter: Payer: Self-pay | Admitting: Rheumatology

## 2018-11-21 VITALS — BP 126/99 | HR 81 | Resp 14 | Ht 72.0 in | Wt 205.4 lb

## 2018-11-21 DIAGNOSIS — M1A09X0 Idiopathic chronic gout, multiple sites, without tophus (tophi): Secondary | ICD-10-CM

## 2018-11-21 DIAGNOSIS — G629 Polyneuropathy, unspecified: Secondary | ICD-10-CM | POA: Diagnosis not present

## 2018-11-21 DIAGNOSIS — M19071 Primary osteoarthritis, right ankle and foot: Secondary | ICD-10-CM

## 2018-11-21 DIAGNOSIS — M19072 Primary osteoarthritis, left ankle and foot: Secondary | ICD-10-CM

## 2018-11-21 DIAGNOSIS — S76112S Strain of left quadriceps muscle, fascia and tendon, sequela: Secondary | ICD-10-CM | POA: Diagnosis not present

## 2018-11-21 DIAGNOSIS — S065XAA Traumatic subdural hemorrhage with loss of consciousness status unknown, initial encounter: Secondary | ICD-10-CM

## 2018-11-21 DIAGNOSIS — M65351 Trigger finger, right little finger: Secondary | ICD-10-CM

## 2018-11-21 DIAGNOSIS — A809 Acute poliomyelitis, unspecified: Secondary | ICD-10-CM | POA: Diagnosis not present

## 2018-11-21 DIAGNOSIS — S065X9A Traumatic subdural hemorrhage with loss of consciousness of unspecified duration, initial encounter: Secondary | ICD-10-CM

## 2018-11-21 DIAGNOSIS — M65312 Trigger thumb, left thumb: Secondary | ICD-10-CM

## 2018-11-21 DIAGNOSIS — S76111S Strain of right quadriceps muscle, fascia and tendon, sequela: Secondary | ICD-10-CM | POA: Diagnosis not present

## 2018-11-21 DIAGNOSIS — M19042 Primary osteoarthritis, left hand: Secondary | ICD-10-CM

## 2018-11-21 DIAGNOSIS — M170 Bilateral primary osteoarthritis of knee: Secondary | ICD-10-CM | POA: Diagnosis not present

## 2018-11-21 DIAGNOSIS — M19041 Primary osteoarthritis, right hand: Secondary | ICD-10-CM

## 2018-11-21 NOTE — Progress Notes (Signed)
Office Visit Note  Patient: Jose [name]             Date of Birth: 1951-09-08           MRN: [id number]             PCP: [name], MD Referring: [name], MD Visit Date: 11/21/2018 Occupation: @GUAROCC @  Subjective:  Pain and Edema of the Right Foot and Edema and Pain of the Left Foot   History of Present Illness: Jose [name] is a 67 y.o. male with history of gout and osteoarthritis.  Patient states that over Thanksgiving he had some wine and following that day he developed sharp pain and discomfort in his left foot.  He called [place] and got a prednisone taper.  He also took some Colcrys.  Gradually the symptoms got better.  He states he also had mild symptoms in his right foot.  He describes redness over his left MTP joint when the flare happened.  He also has underlying neuropathy in his feet which causes discomfort.  He has discomfort in his hands and knee joints from underlying osteoarthritis.  The trigger fingers have resolved.  Activities of Daily Living:  Patient reports morning stiffness for 10 minutes.   Patient Denies nocturnal pain.  Difficulty dressing/grooming: Denies Difficulty climbing stairs: Denies Difficulty getting out of chair: Denies Difficulty using hands for taps, buttons, cutlery, and/or writing: Denies  Review of Systems  Constitutional: Positive for fatigue. Negative for night sweats.  HENT: Negative for mouth sores, mouth dryness and nose dryness.   Eyes: Negative for redness and dryness.  Respiratory: Negative for shortness of breath and difficulty breathing.   Cardiovascular: Negative for chest pain, palpitations, hypertension, irregular heartbeat and swelling in legs/feet.  Gastrointestinal: Negative for constipation and diarrhea.  Endocrine: Negative for increased urination.  Musculoskeletal: Positive for arthralgias, joint pain and morning stiffness. Negative for joint swelling, myalgias, muscle weakness, muscle tenderness and  myalgias.  Skin: Negative for color change, rash, hair loss, nodules/bumps, skin tightness, ulcers and sensitivity to sunlight.  Allergic/Immunologic: Negative for susceptible to infections.  Neurological: Negative for dizziness, fainting, memory loss, night sweats and weakness ( ).  Hematological: Negative for swollen glands.  Psychiatric/Behavioral: Negative for depressed mood and sleep disturbance. The patient is not nervous/anxious.     PMFS History:  Patient Active Problem List   Diagnosis Date Noted  . Candidiasis   . Abnormality of gait   . Hypoalbuminemia due to protein-calorie malnutrition ([place])   . History of gout   . Sleep disturbance   . Constipation   . Rupture of quadriceps tendon, right, sequela 06/19/2017  . Quadriceps tendon rupture, left, sequela 06/19/2017  . Acute blood loss anemia 06/19/2017  . Fall   . History of subdural hematoma   . Post-operative pain   . Recurrent falls   . Quadriceps tendon rupture 06/15/2017  . Primary osteoarthritis of both knees 02/28/2017  . Primary osteoarthritis of both hands 02/28/2017  . Uricacidemia 02/28/2017  . Pain in joint of left knee 02/07/2017  . Idiopathic chronic gout, unspecified site, without tophus (tophi) 11/29/2016  . HEMATURIA UNSPECIFIED 01/25/2009  . ABDOMINAL PAIN 01/25/2009  . XERODERMA 03/10/2008  . UNS ADVRS EFF UNS RX MEDICINAL&BIOLOGICAL SBSTNC 03/10/2008  . ADJUSTMENT REACTION WITH PHYSICAL SYMPTOMS 02/14/2008  . MUSCLE SPASM 02/14/2008  . ACUTE PROSTATITIS 12/16/2007  . BREAST LUMP OR MASS, RIGHT 07/12/2007  . H/O: RCT (rotator cuff tear) 01/13/2007  . GOUT 01/08/2007  Past Medical History:  Diagnosis Date  . Arthritis    R shoulder, great toes, hands- has gout & has injections in knees with Dr. Estanislado Pandy   . Cancer ([place])    skin ca-   . GERD (gastroesophageal reflux disease)   . History of hiatal hernia   . SDH (subdural hematoma) ([facility])     Family History  Problem Relation Age of Onset   . Parkinson's disease Mother   . Heart disease Father   . Parkinson's disease Brother   . Diabetes Brother    Past Surgical History:  Procedure Laterality Date  . BRAIN SURGERY  2016   evacuation of SDH  . CARPAL TUNNEL RELEASE Bilateral   . GREEN LIGHT LASER TURP (TRANSURETHRAL RESECTION OF PROSTATE  04/27/2017  . KNEE ARTHROPLASTY    . QUADRICEPS TENDON REPAIR Bilateral 06/15/2017   Procedure: REPAIR QUADRICEP TENDON;  Surgeon: Meredith Pel, MD;  Location: [facility];  Service: Orthopedics;  Laterality: Bilateral;  . SHOULDER SURGERY Right    Social History   Social History Narrative  . Not on file    Objective: Vital Signs: BP (!) 126/99 (BP Location: Left Arm, Patient Position: Sitting, Cuff Size: Large)   Pulse 81   Resp 14   Ht 6' (1.829 m)   Wt 205 lb 6.4 oz (93.2 kg)   BMI 27.86 kg/m    Physical Exam  Constitutional: He is oriented to person, place, and time. He appears well-developed and well-nourished.  HENT:  Head: Normocephalic and atraumatic.  Eyes: Pupils are equal, round, and reactive to light. Conjunctivae and EOM are normal.  Neck: Normal range of motion. Neck supple.  Cardiovascular: Normal rate, regular rhythm and normal heart sounds.  Pulmonary/Chest: Effort normal and breath sounds normal.  Abdominal: Soft. Bowel sounds are normal.  Neurological: He is alert and oriented to person, place, and time.  Skin: Skin is warm and dry. Capillary refill takes less than 2 seconds.  Psychiatric: He has a normal mood and affect. His behavior is normal.  Nursing note and vitals reviewed.    Musculoskeletal Exam: C-spine thoracic lumbar spine good range of motion.  Shoulder joints elbow joints wrist joints with good range of motion.  He has DIP and PIP thickening with no synovitis.  Hip joints knee joints ankles MTPs PIPs were in good range of motion.  He has some MTP thickening but no tenderness on examination today.  CDAI Exam: CDAI Score: Not  documented Patient Global Assessment: Not documented; Provider Global Assessment: Not documented Swollen: Not documented; Tender: Not documented Joint Exam   Not documented   There is currently no information documented on the homunculus. Go to the Rheumatology activity and complete the homunculus joint exam.  Investigation: No additional findings.  Imaging: No results found.  Recent Labs: Lab Results  Component Value Date   WBC 6.4 10/14/2018   HGB 14.1 10/14/2018   PLT 196 10/14/2018   NA 142 11/12/2018   K 4.2 11/12/2018   CL 106 11/12/2018   CO2 29 11/12/2018   GLUCOSE 84 11/12/2018   BUN 19 11/12/2018   CREATININE 1.09 11/12/2018   BILITOT 0.6 11/12/2018   ALKPHOS 48 06/20/2017   AST 25 11/12/2018   ALT 37 11/12/2018   PROT 6.6 11/12/2018   ALBUMIN 2.6 (L) 06/20/2017   CALCIUM 9.5 11/12/2018   GFRAA 80 11/12/2018    Speciality Comments: No specialty comments available.  Procedures:  No procedures performed Allergies: Quinolones   Assessment / Plan:  Visit Diagnoses: Idiopathic chronic gout of multiple sites without tophus - Allopurinol 300 mg po daily and colchicine 0.6 mg prn.  Patient states that he recently had pain and discomfort in his bilateral feet after drinking wine.  The symptoms resolved after taking Colcrys and prednisone taper.  Today he does not have any tenderness on palpation..uric acid: 10/14/2018 4.8 - Plan: Uric acid  Primary osteoarthritis of both feet-he has been using proper fitting shoes.  Neuropathy -patient states that he has underlying neuropathy and he sees a neurologist.  He wanted me to order some labs to evaluate for neuropathy.  Plan: Hemoglobin A1c, Vitamin B12, Folate  Primary osteoarthritis of both hands-he has DIP and PIP thickening and chronic discomfort.  Primary osteoarthritis of both knees-he has discomfort with climbing stairs.  Trigger little finger of right hand -  cortisone injection on 08/06/2018, his symptoms  have resolved.  Trigger thumb, left thumb -  cortisone injection on 08/06/2018, resolved.  Other medical problems are listed as follows:  [place]  Rupture of quadriceps tendon, right, sequela - Dr. Marlou Sa  Quadriceps tendon rupture, left, sequela - Dr. Marlou Sa  Subdural hematoma [facility])   Orders: Orders Placed This Encounter  Procedures  . Hemoglobin A1c  . Vitamin B12  . Folate  . Uric acid   No orders of the defined types were placed in this encounter.    Follow-Up Instructions: Return for Osteoarthritis, Gout.   Bo Merino, MD  Note - This record has been created using Editor, commissioning.  Chart creation errors have been sought, but may not always  have been located. Such creation errors do not reflect on  the standard of medical care.

## 2018-11-22 LAB — HEMOGLOBIN A1C
Hgb A1c MFr Bld: 5.6 % of total Hgb (ref <5.7)
Mean Plasma Glucose: 114 (calc)
eAG (mmol/L): 6.3 (calc)

## 2018-11-22 LAB — FOLATE: Folate: 15.9 ng/mL

## 2018-11-22 LAB — URIC ACID: Uric Acid, Serum: 4.2 mg/dL (ref 4.0–8.0)

## 2018-11-22 LAB — VITAMIN B12: Vitamin B-12: 452 pg/mL (ref 200–1100)

## 2018-11-22 NOTE — Progress Notes (Signed)
WNLs

## 2018-11-23 ENCOUNTER — Ambulatory Visit (HOSPITAL_COMMUNITY)
Admission: EM | Admit: 2018-11-23 | Discharge: 2018-11-23 | Disposition: A | Discharge status: Home / Self Care | Payer: Medicare Other

## 2018-11-23 ENCOUNTER — Encounter (HOSPITAL_COMMUNITY): Payer: Self-pay | Admitting: Emergency Medicine

## 2018-11-23 ENCOUNTER — Other Ambulatory Visit: Payer: Self-pay

## 2018-11-23 DIAGNOSIS — G8929 Other chronic pain: Secondary | ICD-10-CM

## 2018-11-23 DIAGNOSIS — M545 Low back pain, unspecified: Secondary | ICD-10-CM

## 2018-11-23 MED ORDER — DICLOFENAC SODIUM 75 MG PO TBEC: 75.0000 mg | DELAYED_RELEASE_TABLET | Freq: Two times a day (BID) | ORAL | 0 refills | Status: AC

## 2018-11-23 MED ORDER — KETOROLAC TROMETHAMINE 30 MG/ML IJ SOLN
30.0000 mg | Freq: Once | INTRAMUSCULAR | Status: AC
  Administered 2018-11-23: 30 mg via INTRAMUSCULAR

## 2018-11-23 MED ORDER — KETOROLAC TROMETHAMINE 30 MG/ML IJ SOLN
INTRAMUSCULAR | Status: AC
  Filled 2018-11-23: qty 1

## 2018-11-23 NOTE — Discharge Instructions (Addendum)
Take robaxin as prescribed by Dr. Marlou Sa. Follow up with orthopedist office on Monday

## 2018-11-23 NOTE — ED Triage Notes (Signed)
Patient has lower back pain, patient has chronic back issues and has had cortisone shots x 2 .  Second shot did not help as much as previous shot.    On Thursday pain started and has progressively worsened.  Patient has chronic issues with legs.  Tingling in lower left leg is new

## 2018-11-23 NOTE — ED Provider Notes (Signed)
[facility]    CSN: [id number] Arrival date & time: 11/23/18  1203     History   Chief Complaint Chief Complaint  Patient presents with  . Back Pain    HPI Jose [name] is a 67 y.o. male.   67 year old male presents with left lower back pain that radiates down his left leg.  Condition is chronic in nature.  Patient states that 2016 he was transferring from stretcher to a scanner when he fell off the stretcher and hurt his back.  Patient reports 2 years later began having back pain that he correlates with that initial incident.  Patient has been seen by orthopedist and treated with injections one occurring approximately 7 weeks ago and the last one occurring approximately 2 weeks ago.  Patient states that the last injection had minimal relief and that he had an increase in pain times the last 48 hours.  Is requesting pain meds with codeine until he can schedule to be seen by his orthopedist.  Patient denies any urinary symptoms or loss of bowel or bladder.     Past Medical History:  Diagnosis Date  . Arthritis    R shoulder, great toes, hands- has gout & has injections in knees with Dr. Estanislado Pandy   . Cancer ([place])    skin ca-   . GERD (gastroesophageal reflux disease)   . History of hiatal hernia   . SDH (subdural hematoma) [facility])     Patient Active Problem List   Diagnosis Date Noted  . Candidiasis   . Abnormality of gait   . Hypoalbuminemia due to protein-calorie malnutrition ([place])   . History of gout   . Sleep disturbance   . Constipation   . Rupture of quadriceps tendon, right, sequela 06/19/2017  . Quadriceps tendon rupture, left, sequela 06/19/2017  . Acute blood loss anemia 06/19/2017  . Fall   . History of subdural hematoma   . Post-operative pain   . Recurrent falls   . Quadriceps tendon rupture 06/15/2017  . Primary osteoarthritis of both knees 02/28/2017  . Primary osteoarthritis of both hands 02/28/2017  . Uricacidemia 02/28/2017  . Pain in  joint of left knee 02/07/2017  . Idiopathic chronic gout, unspecified site, without tophus (tophi) 11/29/2016  . HEMATURIA UNSPECIFIED 01/25/2009  . ABDOMINAL PAIN 01/25/2009  . XERODERMA 03/10/2008  . UNS ADVRS EFF UNS RX MEDICINAL&BIOLOGICAL SBSTNC 03/10/2008  . ADJUSTMENT REACTION WITH PHYSICAL SYMPTOMS 02/14/2008  . MUSCLE SPASM 02/14/2008  . ACUTE PROSTATITIS 12/16/2007  . BREAST LUMP OR MASS, RIGHT 07/12/2007  . H/O: RCT (rotator cuff tear) 01/13/2007  . GOUT 01/08/2007    Past Surgical History:  Procedure Laterality Date  . BRAIN SURGERY  2016   evacuation of SDH  . CARPAL TUNNEL RELEASE Bilateral   . GREEN LIGHT LASER TURP (TRANSURETHRAL RESECTION OF PROSTATE  04/27/2017  . KNEE ARTHROPLASTY    . QUADRICEPS TENDON REPAIR Bilateral 06/15/2017   Procedure: REPAIR QUADRICEP TENDON;  Surgeon: Meredith Pel, MD;  Location: [facility];  Service: Orthopedics;  Laterality: Bilateral;  . SHOULDER SURGERY Right        Home Medications    Prior to Admission medications   Medication Sig Start Date End Date Taking? Authorizing Provider  allopurinol (ZYLOPRIM) 300 MG tablet Take 1 tablet (300 mg total) by mouth daily. 07/09/18  Yes Deveshwar, Abel Presto, MD  esomeprazole (NEXIUM) 40 MG capsule Take 1 capsule (40 mg total) by mouth daily. 04/12/17  Yes Robyn Haber, MD  metoprolol  tartrate (LOPRESSOR) 25 MG tablet Take 25 mg by mouth 2 (two) times daily.   Yes [provider]  Multiple Vitamin (MULTIVITAMIN WITH MINERALS) TABS tablet Take 1 tablet by mouth daily.   Yes [provider]  acetaminophen (TYLENOL) 500 MG tablet Take 500-1,000 mg by mouth every 6 (six) hours as needed for mild pain.    [provider]  diclofenac sodium (VOLTAREN) 1 % GEL Apply to left knee prn pain daily 07/10/18   Meredith Pel, MD  diclofenac sodium (VOLTAREN) 1 % GEL Apply 2 g topically 4 (four) times daily. 11/18/18   Meredith Pel, MD  methocarbamol (ROBAXIN) 500 MG  tablet TAKE 1 TABLET BY MOUTH EVERY 8 HOURS AS NEEDED 11/11/18   Meredith Pel, MD  metoprolol succinate (TOPROL-XL) 25 MG 24 hr tablet TAKE 1 TABLET (25 MG TOTAL) BY MOUTH DAILY. TAKE WITH OR IMMEDIATELY FOLLOWING A MEAL. 03/11/18 10/17/18  Josue Hector, MD  predniSONE (DELTASONE) 5 MG tablet Take 4 tabs po x 2 days, 3  tabs po x 2 days, 2  tabs po x 2 days, 1  tab po x 2 days Patient not taking: Reported on 11/21/2018 10/28/18   Bo Merino, MD  UNABLE TO FIND 1 tablet as needed. Med Name: Col-quit    [provider]    Family History Family History  Problem Relation Age of Onset  . Parkinson's disease Mother   . Heart disease Father   . Parkinson's disease Brother   . Diabetes Brother     Social History Social History   Tobacco Use  . Smoking status: Never Smoker  . Smokeless tobacco: Never Used  Substance Use Topics  . Alcohol use: Yes    Alcohol/week: 8.0 standard drinks    Types: 7 Glasses of wine, 1 Shots of liquor per week  . Drug use: No     Allergies   Quinolones   Review of Systems Review of Systems   Physical Exam Triage Vital Signs ED Triage Vitals  Enc Vitals Group     BP 11/23/18 1325 119/87     Pulse Rate 11/23/18 1325 85     Resp 11/23/18 1325 16     Temp 11/23/18 1325 (!) 97 F (36.1 C)     Temp Source 11/23/18 1325 Oral     SpO2 11/23/18 1325 98 %     Weight --      Height --      Head Circumference --      Peak Flow --      Pain Score 11/23/18 1322 10     Pain Loc --      Pain Edu? --      Excl. in [place]? --    No data found.  Updated Vital Signs BP 119/87 (BP Location: Right Arm)   Pulse 85   Temp (!) 97 F (36.1 C) (Oral)   Resp 16   SpO2 98%   Visual Acuity Right Eye Distance:   Left Eye Distance:   Bilateral Distance:    Right Eye Near:   Left Eye Near:    Bilateral Near:     Physical Exam   UC Treatments / Results  Labs (all labs ordered are listed, but only abnormal results are  displayed) Labs Reviewed - No data to display  EKG None  Radiology No results found.  Procedures Procedures (including critical care time)  Medications Ordered in UC Medications - No data to display  Initial  Impression / Assessment and Plan / UC Course  I have reviewed the triage vital signs and the nursing notes.  Pertinent labs & imaging results that were available during my care of the patient were reviewed by me and considered in my medical decision making (see chart for details).      Final Clinical Impressions(s) / UC Diagnoses   Final diagnoses:  None   Discharge Instructions   None    ED Prescriptions    None     Controlled Substance Prescriptions [place] Controlled Substance Registry consulted?    Jacqualine Mau, NP 11/23/18 1352

## 2018-11-25 ENCOUNTER — Telehealth (INDEPENDENT_AMBULATORY_CARE_PROVIDER_SITE_OTHER): Payer: Self-pay | Admitting: Orthopedic Surgery

## 2018-11-25 NOTE — Telephone Encounter (Signed)
Just let him know he's not here this week, we may can try to work him in next week mar

## 2018-11-25 NOTE — Telephone Encounter (Signed)
Patient called asked for a call back concerning his lower back ad left knee. Patient asked if he can be worked into Dr Randel Pigg schedule as soon as possible. The number to contact patient is [phone]

## 2018-11-26 ENCOUNTER — Telehealth (INDEPENDENT_AMBULATORY_CARE_PROVIDER_SITE_OTHER): Payer: Self-pay | Admitting: Orthopedic Surgery

## 2018-11-26 NOTE — Telephone Encounter (Signed)
Returned call to patient left message to return call to schedule an appointment with Dr. Junius Roads

## 2018-11-26 NOTE — Telephone Encounter (Signed)
Called patient left message on voicemail to return call concerning scheduling an appointment  With Dr. Junius Roads for low back and left knee pain.

## 2018-11-27 ENCOUNTER — Encounter (INDEPENDENT_AMBULATORY_CARE_PROVIDER_SITE_OTHER): Payer: Self-pay | Admitting: Family Medicine

## 2018-11-27 ENCOUNTER — Ambulatory Visit (INDEPENDENT_AMBULATORY_CARE_PROVIDER_SITE_OTHER): Payer: Medicare Other | Admitting: Family Medicine

## 2018-11-27 DIAGNOSIS — M545 Low back pain, unspecified: Secondary | ICD-10-CM

## 2018-11-27 DIAGNOSIS — M25562 Pain in left knee: Secondary | ICD-10-CM

## 2018-11-27 DIAGNOSIS — F438 Other reactions to severe stress: Secondary | ICD-10-CM | POA: Diagnosis not present

## 2018-11-27 DIAGNOSIS — G8929 Other chronic pain: Secondary | ICD-10-CM | POA: Diagnosis not present

## 2018-11-27 MED ORDER — ACETAMINOPHEN-CODEINE #3 300-30 MG PO TABS: 1.0000 | ORAL_TABLET | Freq: Four times a day (QID) | ORAL | 0 refills | Status: DC | PRN

## 2018-11-27 NOTE — Progress Notes (Signed)
Office Visit Note   Patient: Jose [name]           Date of Birth: [birth year]           MRN: [id number] Visit Date: 11/27/2018 Requested by: [name], MD [address], [place], [place] [address] PCP: [name], MD  Subjective: Chief Complaint  Patient presents with  . Left Knee - Pain  . Lower Back - Pain    Left leg went numb yesterday, while he was just standing - stands 8 hours every day at work.    HPI: He is a 67 year old with low back and left leg pain.  He has a longstanding history of problems with his back.  In March he had an MRI scan showing right sided L3-4 disc extrusion causing L3 and L4 nerve impingement.  He also has grade 1 spondylolisthesis at L5-S1 with severe left-sided foraminal narrowing.  He underwent epidural injection per Dr. Ernestina Patches in March and it helped quite a bit for many months.  A couple months ago he had another flareup and went back for an epidural injection in October, this time on the other side, and it did not help as much as the first 1.  Recently his pain has been so severe that he went to urgent care over the weekend requesting medication.  They did not give him anything for his pain.  He is feeling a little bit better today but would like to have medicine on hand just in case.  In the past he did well with physical therapy and he would like to try that again.  He is also interested in trying chiropractic.  He is working in Scientist, research (medical) for [employer] and this has been difficult, pain gets pretty severe when standing long periods of time.  He also gets occasional numbness in his leg when standing.               ROS: Otherwise noncontributory  Objective: Vital Signs: There were no vitals taken for this visit.  Physical Exam:  Low back: Tender near the L5-S1 level, also in the left sciatic notch.  Straight leg raise negative, lower extremity strength and reflexes are still normal.  Imaging: None today.  Assessment &  Plan: 1.  Chronic low back pain with foraminal stenosis on the left at L5-S1, and history of right sided L3-4 disc protrusion -Tylenol 3 given for pain.  Referral to physical therapy.  Referral for another epidural injection similar to the one in March. - Trial of Exos back brace.    Follow-Up Instructions: No follow-ups on file.      Procedures: No procedures performed  No notes on file    PMFS History: Patient Active Problem List   Diagnosis Date Noted  . Candidiasis   . Abnormality of gait   . Hypoalbuminemia due to protein-calorie malnutrition ([place])   . History of gout   . Sleep disturbance   . Constipation   . Rupture of quadriceps tendon, right, sequela 06/19/2017  . Quadriceps tendon rupture, left, sequela 06/19/2017  . Acute blood loss anemia 06/19/2017  . Fall   . History of subdural hematoma   . Post-operative pain   . Recurrent falls   . Quadriceps tendon rupture 06/15/2017  . Primary osteoarthritis of both knees 02/28/2017  . Primary osteoarthritis of both hands 02/28/2017  . Uricacidemia 02/28/2017  . Pain in joint of left knee 02/07/2017  . Idiopathic chronic gout, unspecified site, without tophus (  tophi) 11/29/2016  . HEMATURIA UNSPECIFIED 01/25/2009  . ABDOMINAL PAIN 01/25/2009  . XERODERMA 03/10/2008  . UNS ADVRS EFF UNS RX MEDICINAL&BIOLOGICAL SBSTNC 03/10/2008  . ADJUSTMENT REACTION WITH PHYSICAL SYMPTOMS 02/14/2008  . MUSCLE SPASM 02/14/2008  . ACUTE PROSTATITIS 12/16/2007  . BREAST LUMP OR MASS, RIGHT 07/12/2007  . H/O: RCT (rotator cuff tear) 01/13/2007  . GOUT 01/08/2007   Past Medical History:  Diagnosis Date  . Arthritis    R shoulder, great toes, hands- has gout & has injections in knees with Dr. Estanislado Pandy   . Cancer ([place])    skin ca-   . GERD (gastroesophageal reflux disease)   . History of hiatal hernia   . SDH (subdural hematoma) ([facility])     Family History  Problem Relation Age of Onset  . Parkinson's disease Mother   . Heart  disease Father   . Parkinson's disease Brother   . Diabetes Brother     Past Surgical History:  Procedure Laterality Date  . BRAIN SURGERY  2016   evacuation of SDH  . CARPAL TUNNEL RELEASE Bilateral   . GREEN LIGHT LASER TURP (TRANSURETHRAL RESECTION OF PROSTATE  04/27/2017  . KNEE ARTHROPLASTY    . QUADRICEPS TENDON REPAIR Bilateral 06/15/2017   Procedure: REPAIR QUADRICEP TENDON;  Surgeon: Meredith Pel, MD;  Location: [facility];  Service: Orthopedics;  Laterality: Bilateral;  . SHOULDER SURGERY Right    Social History   Occupational History  . Not on file  Tobacco Use  . Smoking status: Never Smoker  . Smokeless tobacco: Never Used  Substance and Sexual Activity  . Alcohol use: Yes    Alcohol/week: 8.0 standard drinks    Types: 7 Glasses of wine, 1 Shots of liquor per week  . Drug use: No  . Sexual activity: Not on file

## 2018-11-28 ENCOUNTER — Telehealth (INDEPENDENT_AMBULATORY_CARE_PROVIDER_SITE_OTHER): Payer: Self-pay | Admitting: Family Medicine

## 2018-11-28 NOTE — Telephone Encounter (Signed)
Order & demo sheet emailed to [place] @ [facility]

## 2018-12-03 ENCOUNTER — Telehealth: Payer: Self-pay | Admitting: Physical Therapy

## 2018-12-03 NOTE — Telephone Encounter (Signed)
Done in error.   Raeford Razor, PT 12/03/18 4:05 PM Phone: [phone] Fax: [phone]

## 2018-12-05 DIAGNOSIS — F438 Other reactions to severe stress: Secondary | ICD-10-CM | POA: Diagnosis not present

## 2018-12-06 ENCOUNTER — Other Ambulatory Visit: Payer: Self-pay

## 2018-12-06 ENCOUNTER — Encounter: Payer: Self-pay | Admitting: Physical Therapy

## 2018-12-06 ENCOUNTER — Ambulatory Visit: Payer: Medicare Other | Attending: Family Medicine | Admitting: Physical Therapy

## 2018-12-06 DIAGNOSIS — G8929 Other chronic pain: Secondary | ICD-10-CM | POA: Insufficient documentation

## 2018-12-06 DIAGNOSIS — M62838 Other muscle spasm: Secondary | ICD-10-CM | POA: Diagnosis not present

## 2018-12-06 DIAGNOSIS — M545 Low back pain, unspecified: Secondary | ICD-10-CM

## 2018-12-06 DIAGNOSIS — R262 Difficulty in walking, not elsewhere classified: Secondary | ICD-10-CM | POA: Insufficient documentation

## 2018-12-06 NOTE — Therapy (Signed)
[place], [place], [address] Phone: [phone]   Fax:  [phone]  Physical Therapy Evaluation  Patient Details  Name: Jose [name] MRN: [id number] Date of Birth: 1951/05/30 Referring Provider (PT): Dr. Junius Roads    Encounter Date: 12/06/2018  PT End of Session - 12/06/18 0823    Visit Number  1    Number of Visits  12    Date for PT Re-Evaluation  01/17/19    PT Start Time  0800    PT Stop Time  0915    PT Time Calculation (min)  75 min       Past Medical History:  Diagnosis Date  . Arthritis    R shoulder, great toes, hands- has gout & has injections in knees with Dr. Estanislado Pandy   . Cancer ([place])    skin ca-   . GERD (gastroesophageal reflux disease)   . History of hiatal hernia   . SDH (subdural hematoma) (HCC)     Past Surgical History:  Procedure Laterality Date  . BRAIN SURGERY  2016   evacuation of SDH  . CARPAL TUNNEL RELEASE Bilateral   . GREEN LIGHT LASER TURP (TRANSURETHRAL RESECTION OF PROSTATE  04/27/2017  . KNEE ARTHROPLASTY    . QUADRICEPS TENDON REPAIR Bilateral 06/15/2017   Procedure: REPAIR QUADRICEP TENDON;  Surgeon: Meredith Pel, MD;  Location: [facility];  Service: Orthopedics;  Laterality: Bilateral;  . SHOULDER SURGERY Right     There were no vitals filed for this visit.   Subjective Assessment - 12/06/18 0805    Subjective  Patient is familiar to this clinic for previous treatment for knee injury and lumbar radicuolpathy, which then affected his R>L LE.   HIs pain flared up in Oct. and since then has been off and on.  He recently went to Urgent Care as it was severe and he was leaving for out of town. He reports L sided leg pain and feelings of instability/weakness, mild sensory disurbance from time to time. He continues to have difficulty with standing, walking for work, has not been back to the gym due to pain.     Pertinent History  previous back pain, quad tendon  rupture, arthritis    Limitations  Lifting;Standing;Walking    How long can you sit comfortably?  not limited    How long can you stand comfortably?  not as long as walking , 30 min is usually difficult     How long can you walk comfortably?  can walk a distance     Diagnostic tests  MRI 02/2018 RIGHT L3-4 subarticular two extraforaminal disc extrusion , L5-S1 foraminal stenosis .  Also L knee meniscus tear.     Patient Stated Goals  return to the gym     Currently in Pain?  Yes    Pain Score  3     Pain Location  Back    Pain Orientation  Right;Left;Lower    Pain Descriptors / Indicators  Aching    Pain Type  Chronic pain    Pain Radiating Towards  L LE     Pain Onset  More than a month ago    Pain Frequency  Constant    Aggravating Factors   standing     Pain Relieving Factors  stretching , laying on sofa  Rt side     Effect of Pain on Daily Activities  limiting work, gym         [employer]  PT Assessment - 12/06/18 0001      Assessment   Medical Diagnosis  low back pain R side     Referring Provider (PT)  Dr. Junius Roads     Onset Date/Surgical Date  --   chronic    Next MD Visit  see Dr. Ernestina Patches for injection early Jan.     Prior Therapy  Yes      Precautions   Precautions  None    Required Braces or Orthoses  Other Brace/Splint    Other Brace/Splint  knee L       Restrictions   Weight Bearing Restrictions  No      Balance Screen   Has the patient fallen in the past 6 months  Yes    How many times?  1   fall at the gas station    Has the patient had a decrease in activity level because of a fear of falling?   Yes    Is the patient reluctant to leave their home because of a fear of falling?   No      Home Environment   Living Environment  Private residence    Additional Comments  17 steps inside house       Prior Function   Level of Independence  Independent    Vocation  Full time employment    Vocation Requirements  walking, standing, no lifting     Leisure  travel        Cognition   Overall Cognitive Status  Within Functional Limits for tasks assessed      Observation/Other Assessments   Focus on Therapeutic Outcomes (FOTO)   53%      Sensation   Light Touch  Appears Intact    Additional Comments  reports sensory disturbance LLE      Functional Tests   Functional tests  Squat      Squat   Comments  no pain in back       Posture/Postural Control   Posture/Postural Control  Postural limitations    Postural Limitations  Rounded Shoulders;Decreased lumbar lordosis;Increased thoracic kyphosis      AROM   Lumbar Flexion  hands to knee level    pain post thighs and back    Lumbar Extension  75% pain central and L     Lumbar - Right Side Bend  WFL    Lumbar - Left Side Bend  WFL    Lumbar - Right Rotation  25%    Lumbar - Left Rotation  25%      PROM   Overall PROM Comments  decr L hip IR> Rt       Strength   Right Hip Flexion  4+/5    Right Hip ABduction  3+/5    Left Hip Flexion  4+/5    Left Hip ABduction  3-/5    Right Knee Flexion  4+/5    Right Knee Extension  5/5    Left Knee Flexion  4+/5    Left Knee Extension  3/5    Right Ankle Dorsiflexion  5/5    Left Ankle Dorsiflexion  4+/5      Flexibility   Hamstrings  90/90 R 36 deg, L 30 deg       Palpation   Spinal mobility  hypomobile throughout thoracolumbar    Palpation comment  pain lateral to L5 to S1  on Lt side , bilateral piriformis soreness       Slump test  Findings  Negative      Straight Leg Raise   Findings  Negative                Objective measurements completed on examination: See above findings.      [facility] Adult PT Treatment/Exercise - 12/06/18 0001      Self-Care   Other Self-Care Comments   eval findings, POC, verbal review of HEP       Traction   Type of Traction  Lumbar    Min (lbs)  60    Max (lbs)  80    Hold Time  60    Rest Time  15    Time  15             PT Education - 12/06/18 1744    Education Details  PT/POC,  traction rationale, indications    Person(s) Educated  Patient    Methods  Explanation;Verbal cues    Comprehension  Verbalized understanding          PT Long Term Goals - 12/06/18 0454      PT LONG TERM GOAL #1   Title  Patient will demonstrate less than 35% limitation on FOTO.    Time  6    Period  Weeks    Status  New    Target Date  01/17/19      PT LONG TERM GOAL #2   Title  Pt will be show independence with HEP to control pain, LE and back stiffness.     Time  6    Period  Weeks    Status  New    Target Date  01/17/19      PT LONG TERM GOAL #3   Title  Pt will consistently show good body mechanics and lifting techniques to reduce risk of further injury.     Time  6    Period  Weeks    Status  New    Target Date  01/17/19      PT LONG TERM GOAL #4   Title  Patient will stand/walk for up to hour without increased pain in order to perform daily tasks at work.     Time  6    Period  Weeks    Status  New    Target Date  01/17/19      PT LONG TERM GOAL #5   Title  Patient will go up/down 8 steps with reciprocol gait pattern with good control on the L  to improve community and household mobility.      Time  6    Period  Weeks    Status  New    Target Date  01/17/19      Additional Long Term Goals   Additional Long Term Goals  Yes      PT LONG TERM GOAL #6   Title  Pt will report no further LE symptoms (sensory)  with normal daily activities.      Time  6    Period  Weeks    Status  New    Target Date  01/17/19      PT LONG TERM GOAL #7   Title  Pt will return to the gym to further improve his core control and stabilty with no more than min increase in back pain     Time  6    Period  Weeks    Status  New    Target Date  01/17/19  Plan - 12/06/18 0824    Clinical Impression Statement  Pt presents for mod complexity eval of chronic low back pain which has been radicular in nature and affecting daily mobility tasks. His pain has changed  in location and quality over the past 6-8 mos.  He has a back brace on order to improve his ability to work. He showed weakness in L quad which is due to his previous injury.  He had some relief with supine long axis distraction, traction of LLE so we trialed mechanical traction today.  Due to the fact that he has been through multiple visits of PT, this episode will focus on Pilates for lumbar stabilization to give him a mode of therapeutic exercise he can take to the community. He has not tried this approach in the past.      History and Personal Factors relevant to plan of care:  OA, back pain, L knee weakness, falls    Clinical Presentation  Evolving    Clinical Presentation due to:  variablility of symptoms     Clinical Decision Making  Moderate    Rehab Potential  Good    PT Frequency  2x / week    PT Duration  6 weeks    PT Treatment/Interventions  ADLs/Self Care Home Management;Moist Heat;Traction;Therapeutic activities;Dry needling;Therapeutic exercise;Ultrasound;Manual techniques;Cryotherapy;Electrical Stimulation;Functional mobility training;Patient/family education;Gait training;Passive range of motion;Neuromuscular re-education;Balance training;Taping   Pilates based PT   PT Next Visit Plan  develop/review HEP . focus on stab.  give prepilates L stab and intro to reformer when able.     PT Home Exercise Plan  has from previous     Consulted and Agree with Plan of Care  Patient       Patient will benefit from skilled therapeutic intervention in order to improve the following deficits and impairments:  Abnormal gait, Hypomobility, Impaired sensation, Decreased strength, Increased fascial restricitons, Pain, Increased muscle spasms, Difficulty walking, Decreased mobility, Decreased balance, Decreased range of motion, Postural dysfunction, Impaired flexibility  Visit Diagnosis: Chronic right-sided low back pain without sciatica  Other muscle spasm  Difficulty in walking, not elsewhere  classified     Problem List Patient Active Problem List   Diagnosis Date Noted  . Candidiasis   . Abnormality of gait   . Hypoalbuminemia due to protein-calorie malnutrition ([place])   . History of gout   . Sleep disturbance   . Constipation   . Rupture of quadriceps tendon, right, sequela 06/19/2017  . Quadriceps tendon rupture, left, sequela 06/19/2017  . Acute blood loss anemia 06/19/2017  . Fall   . History of subdural hematoma   . Post-operative pain   . Recurrent falls   . Quadriceps tendon rupture 06/15/2017  . Primary osteoarthritis of both knees 02/28/2017  . Primary osteoarthritis of both hands 02/28/2017  . Uricacidemia 02/28/2017  . Pain in joint of left knee 02/07/2017  . Idiopathic chronic gout, unspecified site, without tophus (tophi) 11/29/2016  . HEMATURIA UNSPECIFIED 01/25/2009  . ABDOMINAL PAIN 01/25/2009  . XERODERMA 03/10/2008  . UNS ADVRS EFF UNS RX MEDICINAL&BIOLOGICAL SBSTNC 03/10/2008  . ADJUSTMENT REACTION WITH PHYSICAL SYMPTOMS 02/14/2008  . MUSCLE SPASM 02/14/2008  . ACUTE PROSTATITIS 12/16/2007  . BREAST LUMP OR MASS, RIGHT 07/12/2007  . H/O: RCT (rotator cuff tear) 01/13/2007  . GOUT 01/08/2007    Ned Kakar 12/06/2018, 6:06 PM  [facility] [address] [place], [place], [address] Phone: [phone]   Fax:  [phone]  Name: Hadley Detloff Feliz MRN:  [id number] Date of Birth: Apr 17, 1951   Raeford Razor, PT 12/06/18 6:06 PM Phone: [phone] Fax: [phone]

## 2018-12-13 DIAGNOSIS — J029 Acute pharyngitis, unspecified: Secondary | ICD-10-CM | POA: Diagnosis not present

## 2018-12-13 DIAGNOSIS — J22 Unspecified acute lower respiratory infection: Secondary | ICD-10-CM | POA: Diagnosis not present

## 2018-12-13 DIAGNOSIS — R05 Cough: Secondary | ICD-10-CM | POA: Diagnosis not present

## 2018-12-16 DIAGNOSIS — F438 Other reactions to severe stress: Secondary | ICD-10-CM | POA: Diagnosis not present

## 2018-12-23 ENCOUNTER — Encounter: Payer: Self-pay | Admitting: Physical Therapy

## 2018-12-23 ENCOUNTER — Ambulatory Visit: Payer: Medicare Other | Attending: Family Medicine | Admitting: Physical Therapy

## 2018-12-23 DIAGNOSIS — M545 Low back pain, unspecified: Secondary | ICD-10-CM

## 2018-12-23 DIAGNOSIS — R2689 Other abnormalities of gait and mobility: Secondary | ICD-10-CM | POA: Diagnosis present

## 2018-12-23 DIAGNOSIS — S76111S Strain of right quadriceps muscle, fascia and tendon, sequela: Secondary | ICD-10-CM | POA: Insufficient documentation

## 2018-12-23 DIAGNOSIS — M25562 Pain in left knee: Secondary | ICD-10-CM | POA: Insufficient documentation

## 2018-12-23 DIAGNOSIS — G8929 Other chronic pain: Secondary | ICD-10-CM | POA: Insufficient documentation

## 2018-12-23 DIAGNOSIS — R262 Difficulty in walking, not elsewhere classified: Secondary | ICD-10-CM | POA: Insufficient documentation

## 2018-12-23 DIAGNOSIS — M62838 Other muscle spasm: Secondary | ICD-10-CM | POA: Insufficient documentation

## 2018-12-23 DIAGNOSIS — M25561 Pain in right knee: Secondary | ICD-10-CM | POA: Insufficient documentation

## 2018-12-23 NOTE — Patient Instructions (Signed)
   PELVIC TILT  Lie on back, legs bent. Exhale, tilting top of pelvis back, pubic bone up, to flatten lower back. Inhale, rolling pelvis opposite way, top forward, pubic bone down, arch in back. Repeat __10__ times. Do __2__ sessions per day. Copyright  VHI. All rights reserved.    Isometric Hold With Pelvic Floor (Hook-Lying)  Lie with hips and knees bent. Slowly inhale, and then exhale. Pull navel toward spine and tighten pelvic floor. Hold for __10_ seconds. Continue to breathe in and out during hold. Rest for _10__ seconds. Repeat __10_ times. Do __2-3_ times a day.   Knee Fold  Lie on back, legs bent, arms by sides. Exhale, lifting knee to chest. Inhale, returning. Keep abdominals flat, navel to spine. Repeat __10__ times, alternating legs. Do __2__ sessions per day.  Knee Drop  Keep pelvis stable. Without rotating hips, slowly drop knee to side, pause, return to center, bring knee across midline toward opposite hip. Feel obliques engaging. Repeat for ___10_ times each leg.   Copyright  VHI. All rights reserved.       Heel Slide to Straight    TODAY WE DID A STRAIGHT LEG RAISE INSTEAD.  SAME RULES APPLY.  Slide one leg down to straight. Return. Be sure pelvis does not rock forward, tilt, rotate, or tip to side. Do _10__ times. Restabilize pelvis. Repeat with other leg. Do __1-2_ sets, __2_ times per day.  http://ss.exer.us/16   Copyright  VHI. All rights reserved.

## 2018-12-23 NOTE — Therapy (Signed)
[place], [place], [address] Phone: [phone]   Fax:  [phone]  Physical Therapy Treatment  Patient Details  Name: Jose [name] MRN: [id number] Date of Birth: 1951-10-01 Referring Provider (PT): Dr. Junius Roads    Encounter Date: 12/23/2018  PT End of Session - 12/23/18 0855    Visit Number  2    Number of Visits  12    Date for PT Re-Evaluation  01/17/19    PT Start Time  0850    PT Stop Time  0935    PT Time Calculation (min)  45 min    Activity Tolerance  Patient tolerated treatment well    Behavior During Therapy  [facility] for tasks assessed/performed       Past Medical History:  Diagnosis Date  . Arthritis    R shoulder, great toes, hands- has gout & has injections in knees with Dr. Estanislado Pandy   . Cancer ([place])    skin ca-   . GERD (gastroesophageal reflux disease)   . History of hiatal hernia   . SDH (subdural hematoma) (HCC)     Past Surgical History:  Procedure Laterality Date  . BRAIN SURGERY  2016   evacuation of SDH  . CARPAL TUNNEL RELEASE Bilateral   . GREEN LIGHT LASER TURP (TRANSURETHRAL RESECTION OF PROSTATE  04/27/2017  . KNEE ARTHROPLASTY    . QUADRICEPS TENDON REPAIR Bilateral 06/15/2017   Procedure: REPAIR QUADRICEP TENDON;  Surgeon: Meredith Pel, MD;  Location: [facility];  Service: Orthopedics;  Laterality: Bilateral;  . SHOULDER SURGERY Right     There were no vitals filed for this visit.  Subjective Assessment - 12/23/18 0851    Subjective  I'm going for shots tomorrow.  They helped last time for about 6 mos.  When I wake up in the AM I am stiff.  Traction helped last time for a couple days.     Currently in Pain?  Yes    Pain Score  3     Pain Location  Back    Pain Orientation  Lower;Mid   central   Pain Descriptors / Indicators  Discomfort    Pain Type  Chronic pain    Pain Radiating Towards  occasional numbness "melting down my leg" and it gets weak  Always  while i'm standing.     Pain Onset  More than a month ago             [facility] Adult PT Treatment/Exercise - 12/23/18 0001      Lumbar Exercises: Stretches   Pelvic Tilt  10 reps    Pelvic Tilt Limitations  mobility    Lumbar Stabilization Level 1 Limitations  --      Lumbar Exercises: Supine   Clam  20 reps    Clam Limitations  unilateral and bilateral     Bent Knee Raise  15 reps    Bent Knee Raise Limitations  ball     Bridge  10 reps    Bridge Limitations  mod cues to articulate spine, ribs flare     Other Supine Lumbar Exercises  used ball for above stab ex     Other Supine Lumbar Exercises  bridge with clam x 10       Traction   Type of Traction  Lumbar    Min (lbs)  65    Max (lbs)  85    Hold Time  60    Rest Time  15    Time  15             PT Education - 12/23/18 0909    Education Details  HEP for stabilization and concepts     Person(s) Educated  Patient    Methods  Explanation;Demonstration;Verbal cues    Comprehension  Verbalized understanding;Returned demonstration;Need further instruction          PT Long Term Goals - 12/23/18 0919      PT LONG TERM GOAL #1   Title  Patient will demonstrate less than 35% limitation on FOTO.    Status  Unable to assess      PT LONG TERM GOAL #2   Title  Pt will be show independence with HEP to control pain, LE and back stiffness.     Status  On-going      PT LONG TERM GOAL #3   Title  Pt will consistently show good body mechanics and lifting techniques to reduce risk of further injury.     Status  On-going      PT LONG TERM GOAL #4   Title  Patient will stand/walk for up to hour without increased pain in order to perform daily tasks at work.     Status  Unable to assess      PT LONG TERM GOAL #5   Title  Patient will go up/down 8 steps with reciprocol gait pattern with good control on the L  to improve community and household mobility.      Status  On-going      PT LONG TERM GOAL #6   Title  Pt  will report no further LE symptoms (sensory)  with normal daily activities.      Status  On-going      PT LONG TERM GOAL #7   Title  Pt will return to the gym to further improve his core control and stabilty with no more than min increase in back pain     Status  On-going            Plan - 12/23/18 0859    Clinical Impression Statement  Patient here for 1 st treatment.  Did have relief of symptoms for a couple days following mechanical lumbar traction.  Given Pre-Pilates HEP, needed mod cues to breathe and show proper form.      PT Treatment/Interventions  ADLs/Self Care Home Management;Moist Heat;Traction;Therapeutic activities;Dry needling;Therapeutic exercise;Ultrasound;Manual techniques;Cryotherapy;Electrical Stimulation;Functional mobility training;Patient/family education;Gait training;Passive range of motion;Neuromuscular re-education;Balance training;Taping    PT Next Visit Plan  develop/review HEP . focus on stabilization.  How was injection?  and intro to reformer when able.     PT Home Exercise Plan  stabilization: neutral clam (unilateral) and march , SLR vs heel slide     Consulted and Agree with Plan of Care  Patient       Patient will benefit from skilled therapeutic intervention in order to improve the following deficits and impairments:  Abnormal gait, Hypomobility, Impaired sensation, Decreased strength, Increased fascial restricitons, Pain, Increased muscle spasms, Difficulty walking, Decreased mobility, Decreased balance, Decreased range of motion, Postural dysfunction, Impaired flexibility  Visit Diagnosis: Chronic right-sided low back pain without sciatica  Other muscle spasm  Difficulty in walking, not elsewhere classified     Problem List Patient Active Problem List   Diagnosis Date Noted  . Candidiasis   . Abnormality of gait   . Hypoalbuminemia due to protein-calorie malnutrition ([place])   . History of gout   . Sleep  disturbance   . Constipation   .  Rupture of quadriceps tendon, right, sequela 06/19/2017  . Quadriceps tendon rupture, left, sequela 06/19/2017  . Acute blood loss anemia 06/19/2017  . Fall   . History of subdural hematoma   . Post-operative pain   . Recurrent falls   . Quadriceps tendon rupture 06/15/2017  . Primary osteoarthritis of both knees 02/28/2017  . Primary osteoarthritis of both hands 02/28/2017  . Uricacidemia 02/28/2017  . Pain in joint of left knee 02/07/2017  . Idiopathic chronic gout, unspecified site, without tophus (tophi) 11/29/2016  . HEMATURIA UNSPECIFIED 01/25/2009  . ABDOMINAL PAIN 01/25/2009  . XERODERMA 03/10/2008  . UNS ADVRS EFF UNS RX MEDICINAL&BIOLOGICAL SBSTNC 03/10/2008  . ADJUSTMENT REACTION WITH PHYSICAL SYMPTOMS 02/14/2008  . MUSCLE SPASM 02/14/2008  . ACUTE PROSTATITIS 12/16/2007  . BREAST LUMP OR MASS, RIGHT 07/12/2007  . H/O: RCT (rotator cuff tear) 01/13/2007  . GOUT 01/08/2007    Braydyn Schultes 12/23/2018, 9:23 AM  [facility] [address] [place], [place], [address] Phone: [phone]   Fax:  [phone]  Name: Jose [name] MRN: [id number] Date of Birth: 1951-08-21  Raeford Razor, PT 12/23/18 9:23 AM Phone: [phone] Fax: [phone]

## 2018-12-24 ENCOUNTER — Ambulatory Visit (INDEPENDENT_AMBULATORY_CARE_PROVIDER_SITE_OTHER): Payer: Self-pay

## 2018-12-24 ENCOUNTER — Encounter

## 2018-12-24 ENCOUNTER — Ambulatory Visit (INDEPENDENT_AMBULATORY_CARE_PROVIDER_SITE_OTHER): Payer: Medicare Other | Admitting: Physical Medicine and Rehabilitation

## 2018-12-24 ENCOUNTER — Encounter (INDEPENDENT_AMBULATORY_CARE_PROVIDER_SITE_OTHER): Payer: Self-pay | Admitting: Physical Medicine and Rehabilitation

## 2018-12-24 VITALS — BP 135/92 | HR 89 | Temp 98.6°F

## 2018-12-24 DIAGNOSIS — M4316 Spondylolisthesis, lumbar region: Secondary | ICD-10-CM

## 2018-12-24 DIAGNOSIS — M48061 Spinal stenosis, lumbar region without neurogenic claudication: Secondary | ICD-10-CM | POA: Diagnosis not present

## 2018-12-24 DIAGNOSIS — M5116 Intervertebral disc disorders with radiculopathy, lumbar region: Secondary | ICD-10-CM | POA: Diagnosis not present

## 2018-12-24 DIAGNOSIS — M5416 Radiculopathy, lumbar region: Secondary | ICD-10-CM

## 2018-12-24 MED ORDER — BETAMETHASONE SOD PHOS & ACET 6 (3-3) MG/ML IJ SUSP
12.0000 mg | Freq: Once | INTRAMUSCULAR | Status: AC
  Administered 2018-12-24: 12 mg

## 2018-12-24 NOTE — Procedures (Signed)
Lumbosacral Transforaminal Epidural Steroid Injection - Sub-Pedicular Approach with Fluoroscopic Guidance  Patient: Jose [name]      Date of Birth: Jul 15, 1951 MRN: [id number] PCP: [name], MD      Visit Date: 12/24/2018   Universal Protocol:    Date/Time: 12/24/2018  Consent Given By: the patient  Position: PRONE  Additional Comments: Vital signs were monitored before and after the procedure. Patient was prepped and draped in the usual sterile fashion. The correct patient, procedure, and site was verified.   Injection Procedure Details:  Procedure Site One Meds Administered:  Meds ordered this encounter  Medications  . betamethasone acetate-betamethasone sodium phosphate (CELESTONE) injection 12 mg    Laterality: Left  Location/Site:  L4-L5  Needle size: 22 G  Needle type: Spinal  Needle Placement: Transforaminal  Findings:    -Comments: Excellent flow of contrast along the nerve and into the epidural space.  Procedure Details: After squaring off the end-plates to get a true AP view, the C-arm was positioned so that an oblique view of the foramen as noted above was visualized. The target area is just inferior to the "nose of the scotty dog" or sub pedicular. The soft tissues overlying this structure were infiltrated with 2-3 ml. of 1% Lidocaine without Epinephrine.  The spinal needle was inserted toward the target using a "trajectory" view along the fluoroscope beam.  Under AP and lateral visualization, the needle was advanced so it did not puncture dura and was located close the 6 O'Clock position of the pedical in AP tracterory. Biplanar projections were used to confirm position. Aspiration was confirmed to be negative for CSF and/or blood. A 1-2 ml. volume of Isovue-250 was injected and flow of contrast was noted at each level. Radiographs were obtained for documentation purposes.   After attaining the desired flow of contrast documented above, a 0.5 to  1.0 ml test dose of 0.25% Marcaine was injected into each respective transforaminal space.  The patient was observed for 90 seconds post injection.  After no sensory deficits were reported, and normal lower extremity motor function was noted,   the above injectate was administered so that equal amounts of the injectate were placed at each foramen (level) into the transforaminal epidural space.   Additional Comments:  The patient tolerated the procedure well Dressing: Band-Aid    Post-procedure details: Patient was observed during the procedure. Post-procedure instructions were reviewed.  Patient left the clinic in stable condition.

## 2018-12-24 NOTE — Patient Instructions (Signed)

## 2018-12-24 NOTE — Progress Notes (Signed)
 .  Numeric Pain Rating Scale and Functional Assessment Average Pain 8   In the last MONTH (on 0-10 scale) has pain interfered with the following?  1. General activity like being  able to carry out your everyday physical activities such as walking, climbing stairs, carrying groceries, or moving a chair?  Rating(6)   +Driver, -BT, -Dye Allergies.  

## 2018-12-25 ENCOUNTER — Telehealth (INDEPENDENT_AMBULATORY_CARE_PROVIDER_SITE_OTHER): Payer: Self-pay

## 2018-12-25 NOTE — Telephone Encounter (Signed)
If worsens let us know but shot looked fine, ok to take easy

## 2018-12-25 NOTE — Telephone Encounter (Signed)
Please advise 

## 2018-12-25 NOTE — Telephone Encounter (Signed)
Patient called stating that last night he started having an aching pain and that it continues to ache.  Wanted to make sure if he should take it easy for the next 2 days.  Cb# is [phone].  Please advise.  Thank you.

## 2018-12-25 NOTE — Telephone Encounter (Signed)
Called patient and left message advising

## 2018-12-27 ENCOUNTER — Telehealth (INDEPENDENT_AMBULATORY_CARE_PROVIDER_SITE_OTHER): Payer: Self-pay | Admitting: Physical Medicine and Rehabilitation

## 2018-12-27 ENCOUNTER — Ambulatory Visit: Payer: Medicare Other | Admitting: Physical Therapy

## 2018-12-27 DIAGNOSIS — R262 Difficulty in walking, not elsewhere classified: Secondary | ICD-10-CM

## 2018-12-27 DIAGNOSIS — M545 Low back pain, unspecified: Secondary | ICD-10-CM

## 2018-12-27 DIAGNOSIS — M62838 Other muscle spasm: Secondary | ICD-10-CM

## 2018-12-27 DIAGNOSIS — G8929 Other chronic pain: Secondary | ICD-10-CM

## 2018-12-27 NOTE — Therapy (Signed)
[place], [place], [address] Phone: [phone]   Fax:  [phone]  Physical Therapy Treatment  Patient Details  Name: Jose [name] MRN: [id number] Date of Birth: 30-Nov-1951 Referring Provider (PT): Dr. Junius Roads    Encounter Date: 12/27/2018  PT End of Session - 12/27/18 1122    Visit Number  3    Number of Visits  12    Date for PT Re-Evaluation  01/17/19    PT Start Time  0800    PT Stop Time  6144    PT Time Calculation (min)  57 min    Activity Tolerance  Patient tolerated treatment well    Behavior During Therapy  [facility] for tasks assessed/performed       Past Medical History:  Diagnosis Date  . Arthritis    R shoulder, great toes, hands- has gout & has injections in knees with Dr. Estanislado Pandy   . Cancer ([place])    skin ca-   . GERD (gastroesophageal reflux disease)   . History of hiatal hernia   . SDH (subdural hematoma) (HCC)     Past Surgical History:  Procedure Laterality Date  . BRAIN SURGERY  2016   evacuation of SDH  . CARPAL TUNNEL RELEASE Bilateral   . GREEN LIGHT LASER TURP (TRANSURETHRAL RESECTION OF PROSTATE  04/27/2017  . KNEE ARTHROPLASTY    . QUADRICEPS TENDON REPAIR Bilateral 06/15/2017   Procedure: REPAIR QUADRICEP TENDON;  Surgeon: Meredith Pel, MD;  Location: [place];  Service: Orthopedics;  Laterality: Bilateral;  . SHOULDER SURGERY Right     There were no vitals filed for this visit.  Subjective Assessment - 12/27/18 1121    Subjective  Patient had soreness after his injection but it has improved over the past few days with res.     Pertinent History  previous back pain, quad tendon rupture, arthritis    Limitations  Lifting;Standing;Walking    How long can you sit comfortably?  not limited    How long can you stand comfortably?  not as long as walking , 30 min is usually difficult     How long can you walk comfortably?  can walk a distance     Diagnostic tests   MRI 02/2018 RIGHT L3-4 subarticular two extraforaminal disc extrusion , L5-S1 foraminal stenosis .  Also L knee meniscus tear.     Patient Stated Goals  return to the gym     Currently in Pain?  Yes    Pain Score  2     Pain Location  Back    Pain Orientation  Lower;Right    Pain Descriptors / Indicators  Aching    Pain Type  Chronic pain    Pain Onset  More than a month ago    Pain Frequency  Constant    Aggravating Factors   standing     Pain Relieving Factors  stretching     Effect of Pain on Daily Activities  limiting activity                        [facility] Adult PT Treatment/Exercise - 12/27/18 0001      Lumbar Exercises: Stretches   Active Hamstring Stretch Limitations  3x30 sec with strap each lef     Pelvic Tilt  10 reps    Pelvic Tilt Limitations  mobility    Piriformis Stretch Limitations  3x30 sec hold bilateral  Lumbar Exercises: Supine   Clam  20 reps    Clam Limitations  green band       Traction   Type of Traction  Lumbar    Min (lbs)  65    Max (lbs)  85    Hold Time  60    Rest Time  15    Time  15      Manual Therapy   Manual Therapy  Manual Traction;Soft tissue mobilization    Manual therapy comments  skilled paplaption of trigger points     Soft tissue mobilization  IASTYM and trigger point release to bilateral glutes; and lumbar paraspinals     Manual Traction  LAD to right LE 3x30 sec hold        Trigger Point Dry Needling - 12/27/18 1128    Consent Given?  Yes    Muscles Treated Lower Body  Gluteus minimus    Gluteus Minimus Response  Palpable increased muscle length;Twitch response elicited   glute medius           PT Education - 12/27/18 1122    Education Details  reviewed benefits and risks of TPDN     Person(s) Educated  Patient    Methods  Explanation;Tactile cues;Demonstration;Verbal cues    Comprehension  Verbalized understanding;Verbal cues required;Returned demonstration;Tactile cues required           PT Long Term Goals - 12/23/18 0919      PT LONG TERM GOAL #1   Title  Patient will demonstrate less than 35% limitation on FOTO.    Status  Unable to assess      PT LONG TERM GOAL #2   Title  Pt will be show independence with HEP to control pain, LE and back stiffness.     Status  On-going      PT LONG TERM GOAL #3   Title  Pt will consistently show good body mechanics and lifting techniques to reduce risk of further injury.     Status  On-going      PT LONG TERM GOAL #4   Title  Patient will stand/walk for up to hour without increased pain in order to perform daily tasks at work.     Status  Unable to assess      PT LONG TERM GOAL #5   Title  Patient will go up/down 8 steps with reciprocol gait pattern with good control on the L  to improve community and household mobility.      Status  On-going      PT LONG TERM GOAL #6   Title  Pt will report no further LE symptoms (sensory)  with normal daily activities.      Status  On-going      PT LONG TERM GOAL #7   Title  Pt will return to the gym to further improve his core control and stabilty with no more than min increase in back pain     Status  On-going            Plan - 12/27/18 1123    Clinical Impression Statement  Patient had a great twitch respose in the right glut medius. He continues to have spasming on the right. Therapy focused on manual therapy and traction this visit.     Clinical Presentation  Evolving    Clinical Decision Making  Moderate    Rehab Potential  Good    PT Frequency  2x / week    PT  Duration  6 weeks    PT Treatment/Interventions  ADLs/Self Care Home Management;Moist Heat;Traction;Therapeutic activities;Dry needling;Therapeutic exercise;Ultrasound;Manual techniques;Cryotherapy;Electrical Stimulation;Functional mobility training;Patient/family education;Gait training;Passive range of motion;Neuromuscular re-education;Balance training;Taping    PT Next Visit Plan  continue with tranction  and manual therapy as tolerated. Work on core strengthening     PT Home Exercise Plan  stabilization: neutral clam (unilateral) and march , SLR vs heel slide     Consulted and Agree with Plan of Care  Patient       Patient will benefit from skilled therapeutic intervention in order to improve the following deficits and impairments:  Abnormal gait, Hypomobility, Impaired sensation, Decreased strength, Increased fascial restricitons, Pain, Increased muscle spasms, Difficulty walking, Decreased mobility, Decreased balance, Decreased range of motion, Postural dysfunction, Impaired flexibility  Visit Diagnosis: Chronic right-sided low back pain without sciatica  Other muscle spasm  Difficulty in walking, not elsewhere classified     Problem List Patient Active Problem List   Diagnosis Date Noted  . Candidiasis   . Abnormality of gait   . Hypoalbuminemia due to protein-calorie malnutrition ([place])   . History of gout   . Sleep disturbance   . Constipation   . Rupture of quadriceps tendon, right, sequela 06/19/2017  . Quadriceps tendon rupture, left, sequela 06/19/2017  . Acute blood loss anemia 06/19/2017  . Fall   . History of subdural hematoma   . Post-operative pain   . Recurrent falls   . Quadriceps tendon rupture 06/15/2017  . Primary osteoarthritis of both knees 02/28/2017  . Primary osteoarthritis of both hands 02/28/2017  . Uricacidemia 02/28/2017  . Pain in joint of left knee 02/07/2017  . Idiopathic chronic gout, unspecified site, without tophus (tophi) 11/29/2016  . HEMATURIA UNSPECIFIED 01/25/2009  . ABDOMINAL PAIN 01/25/2009  . XERODERMA 03/10/2008  . UNS ADVRS EFF UNS RX MEDICINAL&BIOLOGICAL SBSTNC 03/10/2008  . ADJUSTMENT REACTION WITH PHYSICAL SYMPTOMS 02/14/2008  . MUSCLE SPASM 02/14/2008  . ACUTE PROSTATITIS 12/16/2007  . BREAST LUMP OR MASS, RIGHT 07/12/2007  . H/O: RCT (rotator cuff tear) 01/13/2007  . GOUT 01/08/2007    Carney Living PT DPT   12/27/2018, 11:29 AM  [facility] [address] [place], [place], [address] Phone: [phone]   Fax:  [phone]  Name: Jose [name] MRN: [id number] Date of Birth: 23-Jun-1951

## 2018-12-30 ENCOUNTER — Encounter (INDEPENDENT_AMBULATORY_CARE_PROVIDER_SITE_OTHER): Payer: Self-pay

## 2018-12-30 ENCOUNTER — Ambulatory Visit: Payer: Medicare Other | Admitting: Physical Therapy

## 2018-12-30 ENCOUNTER — Telehealth (INDEPENDENT_AMBULATORY_CARE_PROVIDER_SITE_OTHER): Payer: Self-pay | Admitting: Family Medicine

## 2018-12-30 ENCOUNTER — Encounter: Payer: Self-pay | Admitting: Physical Therapy

## 2018-12-30 DIAGNOSIS — G8929 Other chronic pain: Secondary | ICD-10-CM

## 2018-12-30 DIAGNOSIS — M545 Low back pain, unspecified: Secondary | ICD-10-CM

## 2018-12-30 DIAGNOSIS — M62838 Other muscle spasm: Secondary | ICD-10-CM

## 2018-12-30 DIAGNOSIS — R262 Difficulty in walking, not elsewhere classified: Secondary | ICD-10-CM

## 2018-12-30 NOTE — Telephone Encounter (Signed)
Advised patient note is up front for pickup.

## 2018-12-30 NOTE — Telephone Encounter (Signed)
Jose [name] tried returning to work on Saturday (12/28/18) and half-way through had increased pain.  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  He went to PT today and had some dry needling on some knots in his back. He is asking if he can be out of work 2-3 more weeks, while he continues therapy. Please advise.

## 2018-12-30 NOTE — Therapy (Signed)
[place], [place], [address] Phone: [phone]   Fax:  [phone]  Physical Therapy Treatment  Patient Details  Name: Jose [name] MRN: [id number] Date of Birth: November 29, 1951 Referring Provider (PT): Dr. Junius Roads    Encounter Date: 12/30/2018  PT End of Session - 12/30/18 0955    Visit Number  4    Number of Visits  12    Date for PT Re-Evaluation  01/17/19    PT Start Time  0846    PT Stop Time  0952    PT Time Calculation (min)  66 min    Activity Tolerance  Patient tolerated treatment well    Behavior During Therapy  [facility] for tasks assessed/performed       Past Medical History:  Diagnosis Date  . Arthritis    R shoulder, great toes, hands- has gout & has injections in knees with Dr. Estanislado Pandy   . Cancer ([place])    skin ca-   . GERD (gastroesophageal reflux disease)   . History of hiatal hernia   . SDH (subdural hematoma) (HCC)     Past Surgical History:  Procedure Laterality Date  . BRAIN SURGERY  2016   evacuation of SDH  . CARPAL TUNNEL RELEASE Bilateral   . GREEN LIGHT LASER TURP (TRANSURETHRAL RESECTION OF PROSTATE  04/27/2017  . KNEE ARTHROPLASTY    . QUADRICEPS TENDON REPAIR Bilateral 06/15/2017   Procedure: REPAIR QUADRICEP TENDON;  Surgeon: Meredith Pel, MD;  Location: [place];  Service: Orthopedics;  Laterality: Bilateral;  . SHOULDER SURGERY Right     There were no vitals filed for this visit.  Subjective Assessment - 12/30/18 0852    Subjective  I feel like light bulb went off when Waunita Schooner talked about the twitch of the muscle in my hip.  I stretch and I feel better.  Its kinda the same.     Currently in Pain?  No/denies         [facility] Adult PT Treatment/Exercise - 12/30/18 0001      Pilates   Pilates Reformer  See note       Cryotherapy   Number Minutes Cryotherapy  15 Minutes    Cryotherapy Location  Lumbar Spine    Type of Cryotherapy  Ice pack   during traction       Traction   Type of Traction  Lumbar    Min (lbs)  65    Max (lbs)  85    Hold Time  60    Rest Time  15    Time  15     Pilates Reformer used for LE/core strength, postural strength, lumbopelvic disassociation and core control.  Exercises included:  Footwork 2 Red 1 blue parallel heels and toes, turnout as well.  Min cues for alignment, full knee ext.  With proper breathing, better abdominal engagement including ribcage.  Double and single leg work   Bridging 3 springs cues to articulate fully especially through thoracic spine, breathing execution.    Supine Arm work 1 Red 1 yellow Arcs, circles , cues needed, legs move with momentum.   Reverse abdominals 1 red UE x 8, LE x 10     PT Long Term Goals - 12/23/18 0919      PT LONG TERM GOAL #1   Title  Patient will demonstrate less than 35% limitation on FOTO.    Status  Unable to assess      PT LONG TERM  GOAL #2   Title  Pt will be show independence with HEP to control pain, LE and back stiffness.     Status  On-going      PT LONG TERM GOAL #3   Title  Pt will consistently show good body mechanics and lifting techniques to reduce risk of further injury.     Status  On-going      PT LONG TERM GOAL #4   Title  Patient will stand/walk for up to hour without increased pain in order to perform daily tasks at work.     Status  Unable to assess      PT LONG TERM GOAL #5   Title  Patient will go up/down 8 steps with reciprocol gait pattern with good control on the L  to improve community and household mobility.      Status  On-going      PT LONG TERM GOAL #6   Title  Pt will report no further LE symptoms (sensory)  with normal daily activities.      Status  On-going      PT LONG TERM GOAL #7   Title  Pt will return to the gym to further improve his core control and stabilty with no more than min increase in back pain     Status  On-going            Plan - 12/30/18 0947    Clinical Impression Statement  Patient  introduced to PIlates Reformer.  Limited in lower abdominal strength , almost bears down vs drawing in with exercises.  When cued fro breathing, this improves.  Showed no increase in pain.  Discussed the fact that long term pain relief will be dependent on compliance with proper exercises and technique.      PT Treatment/Interventions  ADLs/Self Care Home Management;Moist Heat;Traction;Therapeutic activities;Dry needling;Therapeutic exercise;Ultrasound;Manual techniques;Cryotherapy;Electrical Stimulation;Functional mobility training;Patient/family education;Gait training;Passive range of motion;Neuromuscular re-education;Balance training;Taping    PT Next Visit Plan  continue with tranction and manual therapy as tolerated. Work on core strengthening     PT Home Exercise Plan  stabilization: neutral clam (unilateral) and march , SLR vs heel slide     Consulted and Agree with Plan of Care  Patient       Patient will benefit from skilled therapeutic intervention in order to improve the following deficits and impairments:  Abnormal gait, Hypomobility, Impaired sensation, Decreased strength, Increased fascial restricitons, Pain, Increased muscle spasms, Difficulty walking, Decreased mobility, Decreased balance, Decreased range of motion, Postural dysfunction, Impaired flexibility  Visit Diagnosis: Chronic right-sided low back pain without sciatica  Other muscle spasm  Difficulty in walking, not elsewhere classified     Problem List Patient Active Problem List   Diagnosis Date Noted  . Candidiasis   . Abnormality of gait   . Hypoalbuminemia due to protein-calorie malnutrition ([place])   . History of gout   . Sleep disturbance   . Constipation   . Rupture of quadriceps tendon, right, sequela 06/19/2017  . Quadriceps tendon rupture, left, sequela 06/19/2017  . Acute blood loss anemia 06/19/2017  . Fall   . History of subdural hematoma   . Post-operative pain   . Recurrent falls   .  Quadriceps tendon rupture 06/15/2017  . Primary osteoarthritis of both knees 02/28/2017  . Primary osteoarthritis of both hands 02/28/2017  . Uricacidemia 02/28/2017  . Pain in joint of left knee 02/07/2017  . Idiopathic chronic gout, unspecified site, without tophus (tophi) 11/29/2016  . HEMATURIA  UNSPECIFIED 01/25/2009  . ABDOMINAL PAIN 01/25/2009  . XERODERMA 03/10/2008  . UNS ADVRS EFF UNS RX MEDICINAL&BIOLOGICAL SBSTNC 03/10/2008  . ADJUSTMENT REACTION WITH PHYSICAL SYMPTOMS 02/14/2008  . MUSCLE SPASM 02/14/2008  . ACUTE PROSTATITIS 12/16/2007  . BREAST LUMP OR MASS, RIGHT 07/12/2007  . H/O: RCT (rotator cuff tear) 01/13/2007  . GOUT 01/08/2007    PAA,JENNIFER 12/30/2018, 10:00 [place], [place], [address] Phone: [phone]   Fax:  [phone]  Name: Jose [name] MRN: [id number] Date of Birth: 07-17-1951  Raeford Razor, PT 12/30/18 10:00 AM Phone: [phone] Fax: [phone]

## 2018-12-30 NOTE — Telephone Encounter (Signed)
Yes, that's fine 

## 2018-12-30 NOTE — Telephone Encounter (Signed)
Pt called stating that he is having a lot of pain and would like to speak to Karna Christmas about his back PT # is [phone]

## 2018-12-31 NOTE — Progress Notes (Signed)
Jose [name] - 68 y.o. male MRN [id number]  Date of birth: 07/20/1951  Office Visit Note: Visit Date: 12/24/2018 PCP: [name], MD Referred by: [name], MD  Subjective: Chief Complaint  Patient presents with  . Lower Back - Pain  . Left Leg - Pain   HPI:  Jose [name] is a 68 y.o. male who comes in today Comes in for planned interventional spine procedure.  He has history of lumbar disc herniation pretty significant a year or so ago with symptoms on the right side that were very much alleviated with epidural injection.  Now left-sided complaints with prior injection that helped to some degree.  He has vague complaints to try to get a dermatomal pattern on the left.  He has significant knee issues on the left.  He is recently seen Dr. Junius Roads but is been mainly followed by Dr. Marlou Sa.  He reports some pain down the whole leg and some discomfort really in a nondermatomal fashion.  He feels like water running in the leg or a weird sensation or dysesthesia.  I think the best approach because of the prior disc herniation is to complete a left L4 transforaminal injection since the prior injection was really not beneficial.  ROS Otherwise per HPI.  Assessment & Plan: Visit Diagnoses:  1. Lumbar radiculopathy   2. Spondylolisthesis of lumbar region   3. Foraminal stenosis of lumbar region   4. Radiculopathy due to lumbar intervertebral disc disorder     Plan: No additional findings.   Meds & Orders:  Meds ordered this encounter  Medications  . betamethasone acetate-betamethasone sodium phosphate (CELESTONE) injection 12 mg    Orders Placed This Encounter  Procedures  . XR C-ARM NO REPORT  . Epidural Steroid injection    Follow-up: Return if symptoms worsen or fail to improve.   Procedures: No procedures performed  Lumbosacral Transforaminal Epidural Steroid Injection - Sub-Pedicular Approach with Fluoroscopic Guidance  Patient: Jose [name]      Date of Birth:  1951-09-17 MRN: [id number] PCP: [name], MD      Visit Date: 12/24/2018   Universal Protocol:    Date/Time: 12/24/2018  Consent Given By: the patient  Position: PRONE  Additional Comments: Vital signs were monitored before and after the procedure. Patient was prepped and draped in the usual sterile fashion. The correct patient, procedure, and site was verified.   Injection Procedure Details:  Procedure Site One Meds Administered:  Meds ordered this encounter  Medications  . betamethasone acetate-betamethasone sodium phosphate (CELESTONE) injection 12 mg    Laterality: Left  Location/Site:  L4-L5  Needle size: 22 G  Needle type: Spinal  Needle Placement: Transforaminal  Findings:    -Comments: Excellent flow of contrast along the nerve and into the epidural space.  Procedure Details: After squaring off the end-plates to get a true AP view, the C-arm was positioned so that an oblique view of the foramen as noted above was visualized. The target area is just inferior to the "nose of the scotty dog" or sub pedicular. The soft tissues overlying this structure were infiltrated with 2-3 ml. of 1% Lidocaine without Epinephrine.  The spinal needle was inserted toward the target using a "trajectory" view along the fluoroscope beam.  Under AP and lateral visualization, the needle was advanced so it did not puncture dura and was located close the 6 O'Clock position of the pedical in AP tracterory. Biplanar projections were used to confirm position. Aspiration was confirmed to  be negative for CSF and/or blood. A 1-2 ml. volume of Isovue-250 was injected and flow of contrast was noted at each level. Radiographs were obtained for documentation purposes.   After attaining the desired flow of contrast documented above, a 0.5 to 1.0 ml test dose of 0.25% Marcaine was injected into each respective transforaminal space.  The patient was observed for 90 seconds post injection.  After  no sensory deficits were reported, and normal lower extremity motor function was noted,   the above injectate was administered so that equal amounts of the injectate were placed at each foramen (level) into the transforaminal epidural space.   Additional Comments:  The patient tolerated the procedure well Dressing: Band-Aid    Post-procedure details: Patient was observed during the procedure. Post-procedure instructions were reviewed.  Patient left the clinic in stable condition.    Clinical History: MRI LUMBAR SPINE WITHOUT CONTRAST  TECHNIQUE: Multiplanar, multisequence MR imaging of the lumbar spine was performed. No intravenous contrast was administered.  COMPARISON:  Lumbar spine radiographs February 12, 2018  FINDINGS: SEGMENTATION: For the purposes of this report, the last well-formed intervertebral disc is reported as L5-S1.  ALIGNMENT: Maintained lumbar lordosis. Grade 1 L5-S1 anterolisthesis. Chronic bilateral L5 pars interarticularis defects.  VERTEBRAE:Vertebral bodies are intact. Severe L4-5 and L5-S1 disc height loss with moderate to severe subacute discogenic endplate changes T4-1. Moderate acute discogenic endplate changes D6-Q2. Mild acute discogenic endplate changes W9-7. Multilevel mild disc desiccation and mild subacute on chronic discogenic endplate changes. Scattered small Schmorl's nodes severe in a gist.  CONUS MEDULLARIS AND CAUDA EQUINA: Conus medullaris terminates at L1 and demonstrates normal morphology and signal characteristics. Cauda equina is normal.  PARASPINAL AND OTHER SOFT TISSUES: Nonacute. 8 mm T2 bright cyst upper pole LEFT kidney.  DISC LEVELS:  T11-12 and T12-L1: Annular bulging without canal stenosis nor neural foraminal narrowing.  L1-2, L2-3: No disc bulge, canal stenosis nor neural foraminal narrowing. Mild LEFT L2-3 facet arthropathy.  L3-4: Large RIGHT subarticular to extraforaminal disc  extrusion, difficult to quantify due to slice selection with surrounding inflammatory changes. Potential free fragment along the superior RIGHT extraforaminal space. Posteriorly displaced exited RIGHT L3 nerve. Partially effaced RIGHT lateral recess posteriorly displacing the traversing RIGHT L4 nerve. Mild facet arthropathy and ligamentum flavum redundancy without canal stenosis. Moderate RIGHT neural foraminal narrowing with extruded disc within superior foramen. No LEFT neural foraminal narrowing.  L4-5: Small broad-based disc osteophyte complex asymmetric to the RIGHT. Moderate facet arthropathy and ligamentum flavum redundancy without canal stenosis. Mild to moderate bilateral neural foraminal narrowing.  L5-S1: Anterolisthesis. Unroofing of the disc with annular bulging. Minimal facet arthropathy without canal stenosis. Moderate RIGHT, severe LEFT neural foraminal narrowing.  IMPRESSION: 1. Large RIGHT L3-4 subarticular two extraforaminal disc extrusion with suspected extraforaminal free fragment. Exited RIGHT L3 and traversing RIGHT L4 nerve impingement. 2. Grade 1 L5-S1 anterolisthesis on the basis of chronic bilateral L5 pars interarticularis defects. 3. No canal stenosis. Neural foraminal narrowing L3-4 through L5-S1: Severe on the LEFT at L5-S1.   Electronically Signed   By: Elon Alas M.D.   On: 02/22/2018 20:33     Objective:  VS:  HT:    WT:   BMI:     BP:(!) 135/92  HR:89bpm  TEMP:98.6 F (37 C)(Oral)  RESP:  Physical Exam  Ortho Exam Imaging: No results found.

## 2019-01-03 ENCOUNTER — Ambulatory Visit: Payer: Medicare Other | Admitting: Physical Therapy

## 2019-01-06 ENCOUNTER — Encounter: Payer: Self-pay | Admitting: Physical Therapy

## 2019-01-06 ENCOUNTER — Ambulatory Visit: Payer: Medicare Other | Admitting: Physical Therapy

## 2019-01-06 DIAGNOSIS — M545 Low back pain, unspecified: Secondary | ICD-10-CM

## 2019-01-06 DIAGNOSIS — M62838 Other muscle spasm: Secondary | ICD-10-CM

## 2019-01-06 DIAGNOSIS — G8929 Other chronic pain: Secondary | ICD-10-CM

## 2019-01-06 DIAGNOSIS — R262 Difficulty in walking, not elsewhere classified: Secondary | ICD-10-CM

## 2019-01-06 NOTE — Therapy (Signed)
[place], [place], [address] Phone: [phone]   Fax:  [phone]  Physical Therapy Treatment  Patient Details  Name: Jose [name] MRN: [id number] Date of Birth: 12-19-1950 Referring Provider (PT): Dr. Junius Roads    Encounter Date: 01/06/2019  PT End of Session - 01/06/19 0853    Visit Number  5    Number of Visits  12    Date for PT Re-Evaluation  01/17/19    PT Start Time  0848    PT Stop Time  0943    PT Time Calculation (min)  55 min    Activity Tolerance  Patient tolerated treatment well    Behavior During Therapy  [facility] for tasks assessed/performed       Past Medical History:  Diagnosis Date  . Arthritis    R shoulder, great toes, hands- has gout & has injections in knees with Dr. Estanislado Pandy   . Cancer ([place])    skin ca-   . GERD (gastroesophageal reflux disease)   . History of hiatal hernia   . SDH (subdural hematoma) (HCC)     Past Surgical History:  Procedure Laterality Date  . BRAIN SURGERY  2016   evacuation of SDH  . CARPAL TUNNEL RELEASE Bilateral   . GREEN LIGHT LASER TURP (TRANSURETHRAL RESECTION OF PROSTATE  04/27/2017  . KNEE ARTHROPLASTY    . QUADRICEPS TENDON REPAIR Bilateral 06/15/2017   Procedure: REPAIR QUADRICEP TENDON;  Surgeon: Meredith Pel, MD;  Location: [place];  Service: Orthopedics;  Laterality: Bilateral;  . SHOULDER SURGERY Right     There were no vitals filed for this visit.  Subjective Assessment - 01/06/19 0850    Subjective  I have had a bad week/weekend.  Sees Neuro surgeon tomorrow.  Woke Thur with neck and upper back pain. Cont. all night long. Is better now but still there.      Currently in Pain?  Yes    Pain Score  4     Pain Location  Neck    Pain Orientation  Right;Lateral    Pain Descriptors / Indicators  Tightness   muscular    Pain Onset  In the past 7 days    Multiple Pain Sites  Yes    Pain Score  3    Pain Location  Back    Pain  Orientation  Lower    Pain Type  Chronic pain    Pain Onset  More than a month ago    Pain Frequency  Intermittent           [facility] Adult PT Treatment/Exercise - 01/06/19 0001      Lumbar Exercises: Stretches   Lower Trunk Rotation  10 seconds      Lumbar Exercises: Supine   Bridge  15 reps    Basic Lumbar Stabilization Limitations  clam, march and bent knee raise x 10-15     Other Supine Lumbar Exercises  on foam roller : UE alternating flex/ext, horizontal abduction x 10 cues for breathing throughout    Other Supine Lumbar Exercises  chin tuck, scapular squeeze x 10       Modalities   Modalities  Cryotherapy      Cryotherapy   Number Minutes Cryotherapy  15 Minutes    Cryotherapy Location  Lumbar Spine    Type of Cryotherapy  Ice pack      Traction   Type of Traction  Lumbar    Min (lbs)  65    Max (lbs)  85    Hold Time  60    Rest Time  15    Time  15                  PT Long Term Goals - 12/23/18 0919      PT LONG TERM GOAL #1   Title  Patient will demonstrate less than 35% limitation on FOTO.    Status  Unable to assess      PT LONG TERM GOAL #2   Title  Pt will be show independence with HEP to control pain, LE and back stiffness.     Status  On-going      PT LONG TERM GOAL #3   Title  Pt will consistently show good body mechanics and lifting techniques to reduce risk of further injury.     Status  On-going      PT LONG TERM GOAL #4   Title  Patient will stand/walk for up to hour without increased pain in order to perform daily tasks at work.     Status  Unable to assess      PT LONG TERM GOAL #5   Title  Patient will go up/down 8 steps with reciprocol gait pattern with good control on the L  to improve community and household mobility.      Status  On-going      PT LONG TERM GOAL #6   Title  Pt will report no further LE symptoms (sensory)  with normal daily activities.      Status  On-going      PT LONG TERM GOAL #7   Title  Pt will  return to the gym to further improve his core control and stabilty with no more than min increase in back pain     Status  On-going            Plan - 01/06/19 0855    Clinical Impression Statement  Pt with neck pain since last Thursday.  Was unable to come to PT Friday (last appt was Monday 12/30/18).  Able to stretch cervical spine effectively here in clinic. Pain with L sidebending and Rt rotation.   Worked core on foam roller, very challenged and needed mod cues to stabilize prior to limb movement.     PT Treatment/Interventions  ADLs/Self Care Home Management;Moist Heat;Traction;Therapeutic activities;Dry needling;Therapeutic exercise;Ultrasound;Manual techniques;Cryotherapy;Electrical Stimulation;Functional mobility training;Patient/family education;Gait training;Passive range of motion;Neuromuscular re-education;Balance training;Taping    PT Next Visit Plan  continue with traction and manual therapy as tolerated. Work on [employer], [employer]. How's neck?      PT Home Exercise Plan  stabilization: neutral clam (unilateral) and march , SLR vs heel slide , neck stretches     Consulted and Agree with Plan of Care  Patient       Patient will benefit from skilled therapeutic intervention in order to improve the following deficits and impairments:  Abnormal gait, Hypomobility, Impaired sensation, Decreased strength, Increased fascial restricitons, Pain, Increased muscle spasms, Difficulty walking, Decreased mobility, Decreased balance, Decreased range of motion, Postural dysfunction, Impaired flexibility  Visit Diagnosis: Chronic right-sided low back pain without sciatica  Other muscle spasm  Difficulty in walking, not elsewhere classified     Problem List Patient Active Problem List   Diagnosis Date Noted  . Candidiasis   . Abnormality of gait   . Hypoalbuminemia due to protein-calorie malnutrition ([place])   . History of gout   . Sleep disturbance   .  Constipation   . Rupture of  quadriceps tendon, right, sequela 06/19/2017  . Quadriceps tendon rupture, left, sequela 06/19/2017  . Acute blood loss anemia 06/19/2017  . Fall   . History of subdural hematoma   . Post-operative pain   . Recurrent falls   . Quadriceps tendon rupture 06/15/2017  . Primary osteoarthritis of both knees 02/28/2017  . Primary osteoarthritis of both hands 02/28/2017  . Uricacidemia 02/28/2017  . Pain in joint of left knee 02/07/2017  . Idiopathic chronic gout, unspecified site, without tophus (tophi) 11/29/2016  . HEMATURIA UNSPECIFIED 01/25/2009  . ABDOMINAL PAIN 01/25/2009  . XERODERMA 03/10/2008  . UNS ADVRS EFF UNS RX MEDICINAL&BIOLOGICAL SBSTNC 03/10/2008  . ADJUSTMENT REACTION WITH PHYSICAL SYMPTOMS 02/14/2008  . MUSCLE SPASM 02/14/2008  . ACUTE PROSTATITIS 12/16/2007  . BREAST LUMP OR MASS, RIGHT 07/12/2007  . H/O: RCT (rotator cuff tear) 01/13/2007  . GOUT 01/08/2007    Analyse Angst 01/06/2019, 10:17 AM  [facility] [address] [place], [place], [address] Phone: [phone]   Fax:  [phone]  Name: GAVINN COLLARD MRN: [id number] Date of Birth: 1951/02/01  Raeford Razor, PT 01/06/19 10:17 AM Phone: [phone] Fax: [phone]

## 2019-01-10 ENCOUNTER — Other Ambulatory Visit (INDEPENDENT_AMBULATORY_CARE_PROVIDER_SITE_OTHER): Payer: Self-pay | Admitting: Family Medicine

## 2019-01-10 ENCOUNTER — Ambulatory Visit: Payer: Medicare Other | Admitting: Physical Therapy

## 2019-01-10 ENCOUNTER — Encounter: Payer: Self-pay | Admitting: Physical Therapy

## 2019-01-10 DIAGNOSIS — G8929 Other chronic pain: Secondary | ICD-10-CM

## 2019-01-10 DIAGNOSIS — M25562 Pain in left knee: Secondary | ICD-10-CM

## 2019-01-10 DIAGNOSIS — M545 Low back pain, unspecified: Secondary | ICD-10-CM

## 2019-01-10 DIAGNOSIS — M62838 Other muscle spasm: Secondary | ICD-10-CM

## 2019-01-10 DIAGNOSIS — R262 Difficulty in walking, not elsewhere classified: Secondary | ICD-10-CM

## 2019-01-10 DIAGNOSIS — M25561 Pain in right knee: Secondary | ICD-10-CM

## 2019-01-10 NOTE — Therapy (Signed)
[place], [place], [address] Phone: [phone]   Fax:  [phone]  Physical Therapy Treatment  Patient Details  Name: Jose [name] MRN: [id number] Date of Birth: [birth year] Referring Provider (PT): Dr. Junius Roads    Encounter Date: 01/10/2019  PT End of Session - 01/10/19 0852    Visit Number  6    Number of Visits  12    Date for PT Re-Evaluation  01/17/19    PT Start Time  0805    PT Stop Time  0845    PT Time Calculation (min)  40 min    Activity Tolerance  Patient tolerated treatment well    Behavior During Therapy  [facility] for tasks assessed/performed       Past Medical History:  Diagnosis Date  . Arthritis    R shoulder, great toes, hands- has gout & has injections in knees with Dr. Estanislado Pandy   . Cancer ([place])    skin ca-   . GERD (gastroesophageal reflux disease)   . History of hiatal hernia   . SDH (subdural hematoma) (HCC)     Past Surgical History:  Procedure Laterality Date  . BRAIN SURGERY  2016   evacuation of SDH  . CARPAL TUNNEL RELEASE Bilateral   . GREEN LIGHT LASER TURP (TRANSURETHRAL RESECTION OF PROSTATE  04/27/2017  . KNEE ARTHROPLASTY    . QUADRICEPS TENDON REPAIR Bilateral 06/15/2017   Procedure: REPAIR QUADRICEP TENDON;  Surgeon: Meredith Pel, MD;  Location: [place];  Service: Orthopedics;  Laterality: Bilateral;  . SHOULDER SURGERY Right     There were no vitals filed for this visit.  Subjective Assessment - 01/10/19 0850    Subjective  Patient reports that about 2 days ago his back began hurting very bad. Last night he had significnat pain. He had difficulty sleeping.     Pertinent History  previous back pain, quad tendon rupture, arthritis    Limitations  Lifting;Standing;Walking    How long can you sit comfortably?  not limited    How long can you stand comfortably?  not as long as walking , 30 min is usually difficult     How long can you walk comfortably?   can walk a distance     Diagnostic tests  MRI 02/2018 RIGHT L3-4 subarticular two extraforaminal disc extrusion , L5-S1 foraminal stenosis .  Also L knee meniscus tear.     Patient Stated Goals  return to the gym     Pain Score  7    Pain Location  Back    Pain Orientation  Left;Lower    Pain Descriptors / Indicators  Aching    Pain Type  Chronic pain    Pain Onset  More than a month ago    Pain Frequency  Constant    Aggravating Factors   rolling in bed     Pain Relieving Factors  rest and stretching     Effect of Pain on Daily Activities  difficulty perfroming ADL's                        [facility] Adult PT Treatment/Exercise - 01/10/19 0001      Manual Therapy   Manual Therapy  Manual Traction;Soft tissue mobilization    Manual therapy comments  skilled paplaption of trigger points     Soft tissue mobilization  IASTYM and trigger point release to bilateral glutes; and lumbar paraspinals  Manual Traction  LAD to bilateral LE 3x30 sec hold        Trigger Point Dry Needling - 01/10/19 0858    Muscles Treated Upper Body  Longissimus    Longissimus Response  Twitch response elicited;Palpable increased muscle length    Gluteus Minimus Response  Twitch response elicited;Palpable increased muscle length           PT Education - 01/10/19 0852    Education Details  benefits and risk of TPDN     Person(s) Educated  Patient    Methods  Explanation;Tactile cues;Demonstration;Verbal cues;Handout    Comprehension  Verbalized understanding;Returned demonstration;Verbal cues required;Tactile cues required          PT Long Term Goals - 01/10/19 0854      PT LONG TERM GOAL #1   Title  Patient will demonstrate less than 35% limitation on FOTO.    Baseline  29% limited     Period  Weeks    Status  Unable to assess      PT LONG TERM GOAL #2   Title  Pt will be show independence with HEP to control pain, LE and back stiffness.     Baseline  4+/5 left hip flexor and  knee extension     Time  6    Period  Weeks    Status  On-going      PT LONG TERM GOAL #3   Title  Pt will consistently show good body mechanics and lifting techniques to reduce risk of further injury.     Baseline  observed increased core control during exercise today    Time  6    Period  Weeks    Status  On-going      PT LONG TERM GOAL #4   Title  Patient will stand/walk for up to hour without increased pain in order to perform daily tasks at work.     Baseline  able to stand without increased pain. Left knee does buckle at times     Time  6    Period  Weeks    Status  On-going      PT LONG TERM GOAL #5   Title  Patient will go up/down 8 steps with reciprocol gait pattern with good control on the L  to improve community and household mobility.      Baseline  continues to have some difficulty with the left leg but this may be baseline     Time  6    Period  Weeks    Status  On-going      PT LONG TERM GOAL #6   Title  Pt will report no further LE symptoms (sensory)  with normal daily activities.      Time  6    Period  Weeks    Status  On-going      PT LONG TERM GOAL #7   Title  Pt will return to the gym to further improve his core control and stabilty with no more than min increase in back pain     Time  6    Period  Weeks    Status  On-going            Plan - 01/10/19 6948    Clinical Impression Statement  Therapy focused on manual therapy this visit. He reported an improvement in pain after treatment. He is not longer having throbbing pain in is back. He was encouraged to work on his lighter exercises  and stretches when he goest home.     Clinical Presentation  Evolving    Clinical Decision Making  Moderate    Rehab Potential  Good    PT Frequency  2x / week    PT Duration  6 weeks    PT Treatment/Interventions  ADLs/Self Care Home Management;Moist Heat;Traction;Therapeutic activities;Dry needling;Therapeutic exercise;Ultrasound;Manual  techniques;Cryotherapy;Electrical Stimulation;Functional mobility training;Patient/family education;Gait training;Passive range of motion;Neuromuscular re-education;Balance training;Taping    PT Next Visit Plan  continue with exercises and manual therapy     PT Home Exercise Plan  stabilization: neutral clam (unilateral) and march , SLR vs heel slide , neck stretches     Consulted and Agree with Plan of Care  Patient       Patient will benefit from skilled therapeutic intervention in order to improve the following deficits and impairments:  Abnormal gait, Hypomobility, Impaired sensation, Decreased strength, Increased fascial restricitons, Pain, Increased muscle spasms, Difficulty walking, Decreased mobility, Decreased balance, Decreased range of motion, Postural dysfunction, Impaired flexibility  Visit Diagnosis: Chronic right-sided low back pain without sciatica  Other muscle spasm  Difficulty in walking, not elsewhere classified  Acute pain of left knee  Acute pain of right knee     Problem List Patient Active Problem List   Diagnosis Date Noted  . Candidiasis   . Abnormality of gait   . Hypoalbuminemia due to protein-calorie malnutrition ([place])   . History of gout   . Sleep disturbance   . Constipation   . Rupture of quadriceps tendon, right, sequela 06/19/2017  . Quadriceps tendon rupture, left, sequela 06/19/2017  . Acute blood loss anemia 06/19/2017  . Fall   . History of subdural hematoma   . Post-operative pain   . Recurrent falls   . Quadriceps tendon rupture 06/15/2017  . Primary osteoarthritis of both knees 02/28/2017  . Primary osteoarthritis of both hands 02/28/2017  . Uricacidemia 02/28/2017  . Pain in joint of left knee 02/07/2017  . Idiopathic chronic gout, unspecified site, without tophus (tophi) 11/29/2016  . HEMATURIA UNSPECIFIED 01/25/2009  . ABDOMINAL PAIN 01/25/2009  . XERODERMA 03/10/2008  . UNS ADVRS EFF UNS RX MEDICINAL&BIOLOGICAL SBSTNC  03/10/2008  . ADJUSTMENT REACTION WITH PHYSICAL SYMPTOMS 02/14/2008  . MUSCLE SPASM 02/14/2008  . ACUTE PROSTATITIS 12/16/2007  . BREAST LUMP OR MASS, RIGHT 07/12/2007  . H/O: RCT (rotator cuff tear) 01/13/2007  . GOUT 01/08/2007    Carney Living PT DPT  01/10/2019, 9:00 AM  [facility] [address] [place], [place], [address] Phone: [phone]   Fax:  [phone]  Name: Jose [name] MRN: [id number] Date of Birth: 1951/04/18

## 2019-01-13 ENCOUNTER — Encounter: Payer: Self-pay | Admitting: Physical Therapy

## 2019-01-13 ENCOUNTER — Other Ambulatory Visit (INDEPENDENT_AMBULATORY_CARE_PROVIDER_SITE_OTHER): Payer: Self-pay | Admitting: Family Medicine

## 2019-01-13 ENCOUNTER — Encounter (INDEPENDENT_AMBULATORY_CARE_PROVIDER_SITE_OTHER): Payer: Self-pay | Admitting: Family Medicine

## 2019-01-13 ENCOUNTER — Ambulatory Visit: Payer: Medicare Other | Admitting: Physical Therapy

## 2019-01-13 DIAGNOSIS — M545 Low back pain, unspecified: Secondary | ICD-10-CM

## 2019-01-13 DIAGNOSIS — R262 Difficulty in walking, not elsewhere classified: Secondary | ICD-10-CM

## 2019-01-13 DIAGNOSIS — G8929 Other chronic pain: Secondary | ICD-10-CM

## 2019-01-13 DIAGNOSIS — M62838 Other muscle spasm: Secondary | ICD-10-CM

## 2019-01-13 NOTE — Therapy (Signed)
[place], [place], [address] Phone: [phone]   Fax:  [phone]  Physical Therapy Treatment  Patient Details  Name: Jose [name] MRN: [id number] Date of Birth: [birth year] Referring Provider (PT): Dr. Junius Roads    Encounter Date: 01/13/2019  PT End of Session - 01/13/19 0858    Visit Number  7    Number of Visits  12    Date for PT Re-Evaluation  01/17/19    PT Start Time  0850    PT Stop Time  0945    PT Time Calculation (min)  55 min    Activity Tolerance  Patient tolerated treatment well    Behavior During Therapy  [facility] for tasks assessed/performed       Past Medical History:  Diagnosis Date  . Arthritis    R shoulder, great toes, hands- has gout & has injections in knees with Dr. Estanislado Pandy   . Cancer ([place])    skin ca-   . GERD (gastroesophageal reflux disease)   . History of hiatal hernia   . SDH (subdural hematoma) (HCC)     Past Surgical History:  Procedure Laterality Date  . BRAIN SURGERY  2016   evacuation of SDH  . CARPAL TUNNEL RELEASE Bilateral   . GREEN LIGHT LASER TURP (TRANSURETHRAL RESECTION OF PROSTATE  04/27/2017  . KNEE ARTHROPLASTY    . QUADRICEPS TENDON REPAIR Bilateral 06/15/2017   Procedure: REPAIR QUADRICEP TENDON;  Surgeon: Meredith Pel, MD;  Location: [place];  Service: Orthopedics;  Laterality: Bilateral;  . SHOULDER SURGERY Right     There were no vitals filed for this visit.  Subjective Assessment - 01/13/19 0853    Subjective  Better but still hurting.  I may see if my Neuro can treat me here in [place].  Neuro is in [place].  This flare up was central and L LE.  Has had injection in L lumbar spine. Dr. Junius Roads said I should only stand for 15 min out of the hour due to my disc.     Currently in Pain?  Yes    Pain Score  4     Pain Location  Back    Pain Orientation  Lower    Pain Descriptors / Indicators  Aching;Discomfort    Pain Type  Chronic pain     Pain Onset  1 to 4 weeks ago    Pain Frequency  Constant    Pain Relieving Factors  stretching, moving typically makes it better         [facility] Adult PT Treatment/Exercise - 01/13/19 0001      Lumbar Exercises: Stretches   Active Hamstring Stretch  3 reps;30 seconds    Single Knee to Chest Stretch  3 reps;30 seconds    Piriformis Stretch Limitations  2 x 30 crossover for lateral hip       Lumbar Exercises: Aerobic   Nustep  5 min L5 UE and LE       Lumbar Exercises: Supine   Clam  20 reps    Clam Limitations  done bilateral and unilateral    cues for technique    Dead Bug  10 reps    Dead Bug Limitations  lacks lower abs, too challenging     Straight Leg Raise  15 reps      Lumbar Exercises: Quadruped   Single Arm Raise  5 reps    Straight Leg Raise  5 reps    Straight  Leg Raises Limitations       Opposite Arm/Leg Raise  Right arm/Left leg;Left arm/Right leg;10 reps      Modalities   Modalities  Cryotherapy      Cryotherapy   Number Minutes Cryotherapy  15 Minutes    Cryotherapy Location  Lumbar Spine    Type of Cryotherapy  Ice pack      Traction   Type of Traction  Lumbar    Min (lbs)  65    Max (lbs)  85    Hold Time  60    Rest Time  15    Time  15             PT Education - 01/13/19 0941    Education Details  HEP core technique, POC  and DC prep    Person(s) Educated  Patient    Methods  Explanation    Comprehension  Verbalized understanding          PT Long Term Goals - 01/10/19 0854      PT LONG TERM GOAL #1   Title  Patient will demonstrate less than 35% limitation on FOTO.    Baseline  29% limited     Period  Weeks    Status  Unable to assess      PT LONG TERM GOAL #2   Title  Pt will be show independence with HEP to control pain, LE and back stiffness.     Baseline  4+/5 left hip flexor and knee extension     Time  6    Period  Weeks    Status  On-going      PT LONG TERM GOAL #3   Title  Pt will consistently show good body  mechanics and lifting techniques to reduce risk of further injury.     Baseline  observed increased core control during exercise today    Time  6    Period  Weeks    Status  On-going      PT LONG TERM GOAL #4   Title  Patient will stand/walk for up to hour without increased pain in order to perform daily tasks at work.     Baseline  able to stand without increased pain. Left knee does buckle at times     Time  6    Period  Weeks    Status  On-going      PT LONG TERM GOAL #5   Title  Patient will go up/down 8 steps with reciprocol gait pattern with good control on the L  to improve community and household mobility.      Baseline  continues to have some difficulty with the left leg but this may be baseline     Time  6    Period  Weeks    Status  On-going      PT LONG TERM GOAL #6   Title  Pt will report no further LE symptoms (sensory)  with normal daily activities.      Time  6    Period  Weeks    Status  On-going      PT LONG TERM GOAL #7   Title  Pt will return to the gym to further improve his core control and stabilty with no more than min increase in back pain     Time  6    Period  Weeks    Status  On-going  Plan - 01/13/19 0939    Clinical Impression Statement  ABle to reduce pain to none with mat exercises.  Tightness in bilateral hamstrings.  Needs cues for technique of lower abdominals, draw in.  Noted UE weakness in quadruped.     PT Treatment/Interventions  ADLs/Self Care Home Management;Moist Heat;Traction;Therapeutic activities;Dry needling;Therapeutic exercise;Ultrasound;Manual techniques;Cryotherapy;Electrical Stimulation;Functional mobility training;Patient/family education;Gait training;Passive range of motion;Neuromuscular re-education;Balance training;Taping    PT Next Visit Plan  finish POC, DC, FOTO and review final HEP     PT Home Exercise Plan  stabilization: neutral clam (unilateral) and march , SLR vs heel slide , neck stretches      Consulted and Agree with Plan of Care  Patient       Patient will benefit from skilled therapeutic intervention in order to improve the following deficits and impairments:  Abnormal gait, Hypomobility, Impaired sensation, Decreased strength, Increased fascial restricitons, Pain, Increased muscle spasms, Difficulty walking, Decreased mobility, Decreased balance, Decreased range of motion, Postural dysfunction, Impaired flexibility  Visit Diagnosis: Chronic right-sided low back pain without sciatica  Other muscle spasm  Difficulty in walking, not elsewhere classified     Problem List Patient Active Problem List   Diagnosis Date Noted  . Candidiasis   . Abnormality of gait   . Hypoalbuminemia due to protein-calorie malnutrition ([place])   . History of gout   . Sleep disturbance   . Constipation   . Rupture of quadriceps tendon, right, sequela 06/19/2017  . Quadriceps tendon rupture, left, sequela 06/19/2017  . Acute blood loss anemia 06/19/2017  . Fall   . History of subdural hematoma   . Post-operative pain   . Recurrent falls   . Quadriceps tendon rupture 06/15/2017  . Primary osteoarthritis of both knees 02/28/2017  . Primary osteoarthritis of both hands 02/28/2017  . Uricacidemia 02/28/2017  . Pain in joint of left knee 02/07/2017  . Idiopathic chronic gout, unspecified site, without tophus (tophi) 11/29/2016  . HEMATURIA UNSPECIFIED 01/25/2009  . ABDOMINAL PAIN 01/25/2009  . XERODERMA 03/10/2008  . UNS ADVRS EFF UNS RX MEDICINAL&BIOLOGICAL SBSTNC 03/10/2008  . ADJUSTMENT REACTION WITH PHYSICAL SYMPTOMS 02/14/2008  . MUSCLE SPASM 02/14/2008  . ACUTE PROSTATITIS 12/16/2007  . BREAST LUMP OR MASS, RIGHT 07/12/2007  . H/O: RCT (rotator cuff tear) 01/13/2007  . GOUT 01/08/2007    Jibreel Fedewa 01/13/2019, 9:41 AM  [facility] [address] [place], [place], [address] Phone: [phone]   Fax:  [phone]  Name:  Jose [name] MRN: [id number] Date of Birth: [birth year]  Raeford Razor, PT 01/13/19 9:42 AM Phone: [phone] Fax: [phone]

## 2019-01-15 ENCOUNTER — Other Ambulatory Visit: Payer: Self-pay | Admitting: Rheumatology

## 2019-01-15 NOTE — Telephone Encounter (Signed)
Last Visit: 11/21/18  Next Visit: 04/15/19 Labs: 11/12/18 CMP WNL. Creatinine and GFR WNL.  Okay to refill per Dr. Estanislado Pandy

## 2019-01-17 ENCOUNTER — Encounter: Payer: Self-pay | Admitting: Physical Therapy

## 2019-01-17 ENCOUNTER — Ambulatory Visit: Payer: Medicare Other | Admitting: Physical Therapy

## 2019-01-17 DIAGNOSIS — M25562 Pain in left knee: Secondary | ICD-10-CM

## 2019-01-17 DIAGNOSIS — G8929 Other chronic pain: Secondary | ICD-10-CM

## 2019-01-17 DIAGNOSIS — R262 Difficulty in walking, not elsewhere classified: Secondary | ICD-10-CM

## 2019-01-17 DIAGNOSIS — M545 Low back pain, unspecified: Secondary | ICD-10-CM

## 2019-01-17 DIAGNOSIS — M25561 Pain in right knee: Secondary | ICD-10-CM

## 2019-01-17 DIAGNOSIS — S76111S Strain of right quadriceps muscle, fascia and tendon, sequela: Secondary | ICD-10-CM

## 2019-01-17 DIAGNOSIS — R2689 Other abnormalities of gait and mobility: Secondary | ICD-10-CM

## 2019-01-17 DIAGNOSIS — M62838 Other muscle spasm: Secondary | ICD-10-CM

## 2019-01-19 ENCOUNTER — Encounter: Payer: Self-pay | Admitting: Physical Therapy

## 2019-01-19 NOTE — Therapy (Signed)
[place], [place], [address] Phone: [phone]   Fax:  [phone]  Physical Therapy Treatment/Discharge   Patient Details  Name: Jose [name] MRN: [id number] Date of Birth: 12/23/1950 Referring Provider (PT): Dr. Junius Roads    Encounter Date: 01/17/2019  PT End of Session - 01/19/19 1629    Visit Number  8    Number of Visits  12    Date for PT Re-Evaluation  01/17/19    PT Start Time  0803    PT Stop Time  0900    PT Time Calculation (min)  57 min    Activity Tolerance  Patient tolerated treatment well    Behavior During Therapy  [facility] for tasks assessed/performed       Past Medical History:  Diagnosis Date  . Arthritis    R shoulder, great toes, hands- has gout & has injections in knees with Dr. Estanislado Pandy   . Cancer ([facility])    skin ca-   . GERD (gastroesophageal reflux disease)   . History of hiatal hernia   . SDH (subdural hematoma) ([facility])     Past Surgical History:  Procedure Laterality Date  . BRAIN SURGERY  2016   evacuation of SDH  . CARPAL TUNNEL RELEASE Bilateral   . GREEN LIGHT LASER TURP (TRANSURETHRAL RESECTION OF PROSTATE  04/27/2017  . KNEE ARTHROPLASTY    . QUADRICEPS TENDON REPAIR Bilateral 06/15/2017   Procedure: REPAIR QUADRICEP TENDON;  Surgeon: Meredith Pel, MD;  Location: [facility];  Service: Orthopedics;  Laterality: Bilateral;  . SHOULDER SURGERY Right     There were no vitals filed for this visit.                    [facility] Adult PT Treatment/Exercise - 01/19/19 0001      Self-Care   Other Self-Care Comments   reviewed progression of exercises and reviewed community resources for continued care if he needs needling      Traction   Type of Traction  Lumbar    Min (lbs)  65    Max (lbs)  85    Hold Time  60    Rest Time  15    Time  15      Manual Therapy   Manual Therapy  Manual Traction;Soft tissue mobilization    Manual therapy comments   skilled paplaption of trigger points     Soft tissue mobilization  IASTYM and trigger point release to bilateral glutes; and lumbar paraspinals     Manual Traction  LAD to bilateral LE 3x30 sec hold              PT Education - 01/19/19 1621    Education Details  reviewed final HEP     Person(s) Educated  Patient    Methods  Explanation;Demonstration;Tactile cues;Verbal cues    Comprehension  Verbalized understanding;Returned demonstration;Verbal cues required;Tactile cues required          PT Long Term Goals - 01/19/19 1624      PT LONG TERM GOAL #1   Title  Patient will demonstrate less than 35% limitation on FOTO.    Baseline  29% limited     Time  6    Period  Weeks    Status  Achieved      PT LONG TERM GOAL #2   Title  Pt will be show independence with HEP to control pain, LE and back stiffness.  Baseline  perfroming HEP independently     Time  6    Period  Weeks    Status  Achieved      PT LONG TERM GOAL #3   Title  Pt will consistently show good body mechanics and lifting techniques to reduce risk of further injury.     Baseline  observed increased core control during exercise     Time  6    Period  Weeks    Status  Achieved      PT LONG TERM GOAL #4   Title  Patient will stand/walk for up to hour without increased pain in order to perform daily tasks at work.     Baseline  leg still buckles at times and has increased pain     Time  6    Period  Weeks    Status  Not Met      PT LONG TERM GOAL #5   Title  Patient will go up/down 8 steps with reciprocol gait pattern with good control on the L  to improve community and household mobility.      Baseline  continues to have some difficulty with the left leg but this may be baseline     Time  6    Period  Weeks    Status  Not Met      PT LONG TERM GOAL #6   Title  Pt will report no further LE symptoms (sensory)  with normal daily activities.      Baseline  no radiating pain     Time  6    Period   Weeks    Status  Achieved            Plan - 01/19/19 1621    Clinical Impression Statement  Patient has reached max benefit for PT. He has acomplete HEP from this episode and his previous episodes. He has recieved needling and mechanical traction with only intermittent benefit. He will be d/C'd to HEP.     Clinical Presentation  Evolving    Clinical Decision Making  Moderate    Rehab Potential  Good    PT Frequency  2x / week    PT Duration  6 weeks    PT Treatment/Interventions  ADLs/Self Care Home Management;Moist Heat;Traction;Therapeutic activities;Dry needling;Therapeutic exercise;Ultrasound;Manual techniques;Cryotherapy;Electrical Stimulation;Functional mobility training;Patient/family education;Gait training;Passive range of motion;Neuromuscular re-education;Balance training;Taping    PT Next Visit Plan  finish POC, DC, FOTO and review final HEP     PT Home Exercise Plan  stabilization: neutral clam (unilateral) and march , SLR vs heel slide , neck stretches     Consulted and Agree with Plan of Care  Patient       Patient will benefit from skilled therapeutic intervention in order to improve the following deficits and impairments:  Abnormal gait, Hypomobility, Impaired sensation, Decreased strength, Increased fascial restricitons, Pain, Increased muscle spasms, Difficulty walking, Decreased mobility, Decreased balance, Decreased range of motion, Postural dysfunction, Impaired flexibility  Visit Diagnosis: Chronic right-sided low back pain without sciatica  Other muscle spasm  Difficulty in walking, not elsewhere classified  Acute pain of left knee  Acute pain of right knee  Rupture of quadriceps tendon, right, sequela  Other abnormalities of gait and mobility  PHYSICAL THERAPY DISCHARGE SUMMARY  Visits from Start of Care: 8  Current functional level related to goals / functional outcomes: Continued pain but more tolerable    Remaining deficits: Pain      Education / Equipment:  HEP    Plan: Patient agrees to discharge.  Patient goals were partially met. Patient is being discharged due to being pleased with the current functional level.  ?????       Problem List Patient Active Problem List   Diagnosis Date Noted  . Candidiasis   . Abnormality of gait   . Hypoalbuminemia due to protein-calorie malnutrition ([place])   . History of gout   . Sleep disturbance   . Constipation   . Rupture of quadriceps tendon, right, sequela 06/19/2017  . Quadriceps tendon rupture, left, sequela 06/19/2017  . Acute blood loss anemia 06/19/2017  . Fall   . History of subdural hematoma   . Post-operative pain   . Recurrent falls   . Quadriceps tendon rupture 06/15/2017  . Primary osteoarthritis of both knees 02/28/2017  . Primary osteoarthritis of both hands 02/28/2017  . Uricacidemia 02/28/2017  . Pain in joint of left knee 02/07/2017  . Idiopathic chronic gout, unspecified site, without tophus (tophi) 11/29/2016  . HEMATURIA UNSPECIFIED 01/25/2009  . ABDOMINAL PAIN 01/25/2009  . XERODERMA 03/10/2008  . UNS ADVRS EFF UNS RX MEDICINAL&BIOLOGICAL SBSTNC 03/10/2008  . ADJUSTMENT REACTION WITH PHYSICAL SYMPTOMS 02/14/2008  . MUSCLE SPASM 02/14/2008  . ACUTE PROSTATITIS 12/16/2007  . BREAST LUMP OR MASS, RIGHT 07/12/2007  . H/O: RCT (rotator cuff tear) 01/13/2007  . GOUT 01/08/2007    Carney Living  PT DPT  01/19/2019, 4:30 PM  [facility] [address] [place], [place], [address] Phone: [phone]   Fax:  [phone]  Name: ABDULLAH RIZZI MRN: [id number] Date of Birth: [birth year]

## 2019-01-23 NOTE — Telephone Encounter (Signed)
Closing encounter

## 2019-01-27 ENCOUNTER — Ambulatory Visit (INDEPENDENT_AMBULATORY_CARE_PROVIDER_SITE_OTHER): Payer: Medicare Other | Admitting: Orthopedic Surgery

## 2019-01-27 ENCOUNTER — Encounter (INDEPENDENT_AMBULATORY_CARE_PROVIDER_SITE_OTHER): Payer: Self-pay | Admitting: Orthopedic Surgery

## 2019-01-27 VITALS — Ht 72.0 in | Wt 197.0 lb

## 2019-01-27 DIAGNOSIS — M545 Low back pain, unspecified: Secondary | ICD-10-CM

## 2019-01-27 DIAGNOSIS — G8929 Other chronic pain: Secondary | ICD-10-CM | POA: Diagnosis not present

## 2019-01-27 MED ORDER — ACETAMINOPHEN-CODEINE #3 300-30 MG PO TABS: 1.0000 | ORAL_TABLET | Freq: Two times a day (BID) | ORAL | 0 refills | Status: DC | PRN

## 2019-01-28 ENCOUNTER — Encounter (INDEPENDENT_AMBULATORY_CARE_PROVIDER_SITE_OTHER): Payer: Self-pay | Admitting: Orthopedic Surgery

## 2019-01-28 NOTE — Progress Notes (Signed)
Office Visit Note   Patient: Jose [name]           Date of Birth: 1951/07/05           MRN: [id number] Visit Date: 01/27/2019 Requested by: [name], MD [address], [place], [place] [address] PCP: [name], MD  Subjective: Chief Complaint  Patient presents with  . Left Knee - Pain  . Lower Back - Pain    HPI: Jose [name] is a patient here for follow-up of his left knee.  Has chronic partial quad rupture.  That is really unchanged.  He is now reporting a lot of low back pain with radiation on the left-hand side.  He has been for 2 epidural steroid injections and the second shot did not help much.  He is going to therapy.  Has low back pain and spasms which radiate down that left leg.  He is in physical therapy and doing dry needling.  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  Left knee occasionally gives out.  His left knee is minimally symptomatic.              ROS: All systems reviewed are negative as they relate to the chief complaint within the history of present illness.  Patient denies  fevers or chills.   Assessment & Plan: Visit Diagnoses:  1. Chronic low back pain, unspecified back pain laterality, unspecified whether sciatica present     Plan: Impression is low back pain with some left leg quad weakness.  He has had 2 injections.  I will refill Tylenol 3 and do some work adjustment for him on a form.  Come back in about 8 weeks for clinical recheck.  He may be heading for surgical intervention.  Overall his condition is deteriorating regarding his back.  Follow-Up Instructions: Return in about 8 weeks (around 03/24/2019).   Orders:  No orders of the defined types were placed in this encounter.  Meds ordered this encounter  Medications  . acetaminophen-codeine (TYLENOL #3) 300-30 MG tablet    Sig: Take 1 tablet by mouth 2 (two) times daily as needed for moderate pain.    Dispense:  45 tablet    Refill:  0      Procedures: No  procedures performed   Clinical Data: No additional findings.  Objective: Vital Signs: Ht 6' (1.829 m)   Wt 197 lb (89.4 kg)   BMI 26.72 kg/m   Physical Exam:   Constitutional: Patient appears well-developed HEENT:  Head: Normocephalic Eyes:EOM are normal Neck: Normal range of motion Cardiovascular: Normal rate Pulmonary/chest: Effort normal Neurologic: Patient is alert Skin: Skin is warm Psychiatric: Patient has normal mood and affect    Ortho Exam: Ortho exam demonstrates full active and passive range of motion of both knees.  Has little bit of weakness on the left compared to right but no effusion in either knee.  Pedal pulses palpable.  No masses lymphadenopathy or skin changes noted in that left knee region  Specialty Comments:  No specialty comments available.  Imaging: No results found.   PMFS History: Patient Active Problem List   Diagnosis Date Noted  . Candidiasis   . Abnormality of gait   . Hypoalbuminemia due to protein-calorie malnutrition ([place])   . History of gout   . Sleep disturbance   . Constipation   . Rupture of quadriceps tendon, right, sequela 06/19/2017  . Quadriceps tendon rupture, left, sequela 06/19/2017  . Acute  blood loss anemia 06/19/2017  . Fall   . History of subdural hematoma   . Post-operative pain   . Recurrent falls   . Quadriceps tendon rupture 06/15/2017  . Primary osteoarthritis of both knees 02/28/2017  . Primary osteoarthritis of both hands 02/28/2017  . Uricacidemia 02/28/2017  . Pain in joint of left knee 02/07/2017  . Idiopathic chronic gout, unspecified site, without tophus (tophi) 11/29/2016  . HEMATURIA UNSPECIFIED 01/25/2009  . ABDOMINAL PAIN 01/25/2009  . XERODERMA 03/10/2008  . UNS ADVRS EFF UNS RX MEDICINAL&BIOLOGICAL SBSTNC 03/10/2008  . ADJUSTMENT REACTION WITH PHYSICAL SYMPTOMS 02/14/2008  . MUSCLE SPASM 02/14/2008  . ACUTE PROSTATITIS 12/16/2007  . BREAST LUMP OR MASS, RIGHT 07/12/2007  . H/O: RCT  (rotator cuff tear) 01/13/2007  . GOUT 01/08/2007   Past Medical History:  Diagnosis Date  . Arthritis    R shoulder, great toes, hands- has gout & has injections in knees with Dr. Estanislado Pandy   . Cancer ([facility])    skin ca-   . GERD (gastroesophageal reflux disease)   . History of hiatal hernia   . SDH (subdural hematoma) ([facility])     Family History  Problem Relation Age of Onset  . Parkinson's disease Mother   . Heart disease Father   . Parkinson's disease Brother   . Diabetes Brother     Past Surgical History:  Procedure Laterality Date  . BRAIN SURGERY  2016   evacuation of SDH  . CARPAL TUNNEL RELEASE Bilateral   . GREEN LIGHT LASER TURP (TRANSURETHRAL RESECTION OF PROSTATE  04/27/2017  . KNEE ARTHROPLASTY    . QUADRICEPS TENDON REPAIR Bilateral 06/15/2017   Procedure: REPAIR QUADRICEP TENDON;  Surgeon: Meredith Pel, MD;  Location: [facility];  Service: Orthopedics;  Laterality: Bilateral;  . SHOULDER SURGERY Right    Social History   Occupational History  . Not on file  Tobacco Use  . Smoking status: Never Smoker  . Smokeless tobacco: Never Used  Substance and Sexual Activity  . Alcohol use: Yes    Alcohol/week: 8.0 standard drinks    Types: 7 Glasses of wine, 1 Shots of liquor per week  . Drug use: No  . Sexual activity: Not on file

## 2019-01-31 ENCOUNTER — Telehealth (INDEPENDENT_AMBULATORY_CARE_PROVIDER_SITE_OTHER): Payer: Self-pay

## 2019-01-31 NOTE — Telephone Encounter (Signed)
HR dept received medical leave paper work and needs to confirm date that patient is out of work until? Pt says 03/06/19 and she needs to confirm.

## 2019-02-03 NOTE — Telephone Encounter (Signed)
Please advise. Are you keeping patient out of work through 03/06/19?

## 2019-02-03 NOTE — Telephone Encounter (Signed)
Ok but it was modified type work based on my recollection of forms

## 2019-02-04 NOTE — Telephone Encounter (Signed)
Tried calling her back. No answer. LMVM for her advising per form in patients chart and Dr Forbes Cellar note that should be modified work rather than continuous leave.

## 2019-02-26 ENCOUNTER — Telehealth: Payer: Self-pay | Admitting: Cardiovascular Disease

## 2019-02-26 DIAGNOSIS — E785 Hyperlipidemia, unspecified: Secondary | ICD-10-CM

## 2019-02-26 NOTE — Telephone Encounter (Signed)
Patient would like to get lipid and liver panel this Friday when he gets echo done. Will forward to Dr. Marlou Porch to get orders for lab work since Dr. Johnsie Cancel is not in this week.

## 2019-02-26 NOTE — Telephone Encounter (Signed)
New Message         Patient is having a Echocardiogram on Friday 02/28/19 and he is asking if he needs blood work on that day and if so, can he do the blood work the same day of the Echo? pls call and advise.

## 2019-02-26 NOTE — Telephone Encounter (Signed)
Left message for patient to call back  

## 2019-02-28 ENCOUNTER — Other Ambulatory Visit: Payer: Self-pay

## 2019-02-28 ENCOUNTER — Ambulatory Visit (HOSPITAL_COMMUNITY): Payer: Medicare Other | Attending: Cardiology

## 2019-02-28 DIAGNOSIS — Z8249 Family history of ischemic heart disease and other diseases of the circulatory system: Secondary | ICD-10-CM | POA: Diagnosis present

## 2019-02-28 DIAGNOSIS — Q231 Congenital insufficiency of aortic valve: Secondary | ICD-10-CM | POA: Insufficient documentation

## 2019-02-28 DIAGNOSIS — I350 Nonrheumatic aortic (valve) stenosis: Secondary | ICD-10-CM | POA: Diagnosis present

## 2019-02-28 DIAGNOSIS — Q2381 Bicuspid aortic valve: Secondary | ICD-10-CM

## 2019-02-28 NOTE — Telephone Encounter (Signed)
Please order lipid panel and lfts Candee Furbish, MD

## 2019-03-03 ENCOUNTER — Other Ambulatory Visit: Payer: Medicare Other | Admitting: *Deleted

## 2019-03-03 ENCOUNTER — Other Ambulatory Visit: Payer: Self-pay

## 2019-03-03 ENCOUNTER — Telehealth: Payer: Self-pay | Admitting: Cardiovascular Disease

## 2019-03-03 DIAGNOSIS — E785 Hyperlipidemia, unspecified: Secondary | ICD-10-CM

## 2019-03-03 LAB — LIPID PANEL
Chol/HDL Ratio: 3.3 ratio (ref 0.0–5.0)
Cholesterol, Total: 204 mg/dL — ABNORMAL HIGH (ref 100–199)
HDL: 61 mg/dL (ref >39)
LDL Calculated: 110 mg/dL — ABNORMAL HIGH (ref 0–99)
Triglycerides: 163 mg/dL — ABNORMAL HIGH (ref 0–149)
VLDL Cholesterol Cal: 33 mg/dL (ref 5–40)

## 2019-03-03 LAB — HEPATIC FUNCTION PANEL
ALT: 39 IU/L (ref 0–44)
AST: 25 IU/L (ref 0–40)
Albumin: 4.3 g/dL (ref 3.8–4.8)
Alkaline Phosphatase: 68 IU/L (ref 39–117)
Bilirubin Total: 0.6 mg/dL (ref 0.0–1.2)
Bilirubin, Direct: 0.16 mg/dL (ref 0.00–0.40)
Total Protein: 6.6 g/dL (ref 6.0–8.5)

## 2019-03-03 NOTE — Telephone Encounter (Signed)
New Message   Patient states he needs to have Labs done and wants to speak to the nurse.  Advised patient I could schedule him for the lab but he wants to speak to the nurse about it.

## 2019-03-03 NOTE — Telephone Encounter (Signed)
Patient will come in today for lab work. 

## 2019-03-04 ENCOUNTER — Telehealth: Payer: Self-pay

## 2019-03-04 MED ORDER — ATORVASTATIN CALCIUM 10 MG PO TABS: 10.0000 mg | ORAL_TABLET | Freq: Every day | ORAL | 3 refills | Status: DC

## 2019-03-04 NOTE — Telephone Encounter (Signed)
-----   Message from Josue Hector, MD sent at 03/04/2019  9:37 AM EDT ----- With family history and aneurysm LDL should be less than 75 start lipitor 10 mg daily and f/u labs in 3 months

## 2019-03-04 NOTE — Telephone Encounter (Signed)
Patient aware of results. Per Dr. Johnsie Cancel, With family history and aneurysm LDL should be less than 75 start lipitor 10 mg daily and f/u labs in 3 months. Patient will schedule lab work at appt in April and will start Lipitor 10 mg daily. Patient verbalized understanding.

## 2019-03-07 ENCOUNTER — Ambulatory Visit: Payer: Medicare Other | Admitting: Cardiovascular Disease

## 2019-03-07 ENCOUNTER — Encounter

## 2019-03-07 ENCOUNTER — Other Ambulatory Visit: Payer: Medicare Other

## 2019-03-18 ENCOUNTER — Other Ambulatory Visit: Payer: Self-pay | Admitting: Cardiothoracic Surgery

## 2019-03-18 DIAGNOSIS — I712 Thoracic aortic aneurysm, without rupture, unspecified: Secondary | ICD-10-CM

## 2019-03-18 NOTE — Progress Notes (Unsigned)
Ct

## 2019-03-21 ENCOUNTER — Telehealth (INDEPENDENT_AMBULATORY_CARE_PROVIDER_SITE_OTHER): Payer: Self-pay | Admitting: Orthopedic Surgery

## 2019-03-21 ENCOUNTER — Telehealth: Payer: Self-pay

## 2019-03-21 NOTE — Telephone Encounter (Signed)
IC s/w patient and he was going to email me form that he needs updated that Dr Marlou Sa had previously filled out for him. He is asking for it to say he can stand 45 minutes out of every hour and is not to do any lifting.  Advised would be looking for form and would discuss with Dr Marlou Sa.

## 2019-03-21 NOTE — Telephone Encounter (Signed)
Patient states he needs a email address to send a return to work form to be completed by Dr Marlou Sa of Dr Junius Roads. Patient's callback # [phone]

## 2019-03-21 NOTE — Telephone Encounter (Signed)
Spoke with pt who would like to do a phone visit and move appt sooner. Appointment from 4/13 at 9:15 with Dr. Johnsie Cancel has been moved to 4/10 at 9 am. Pt verbalized understanding and thanked me for the call.

## 2019-03-21 NOTE — Telephone Encounter (Signed)
YOUR CARDIOLOGY TEAM HAS ARRANGED FOR AN E-VISIT FOR YOUR APPOINTMENT - PLEASE REVIEW IMPORTANT INFORMATION BELOW SEVERAL DAYS PRIOR TO YOUR APPOINTMENT  Due to the recent COVID-19 pandemic, we are transitioning in-person office visits to tele-medicine visits in an effort to decrease unnecessary exposure to our patients and staff. Medicare and most insurances are covering these visits without a copay needed. We also encourage you to sign up for MyChart if you have not already done so. You will need a smartphone if possible. For patients that do not have this, we can still complete the visit using a regular telephone but do prefer a smartphone to enable video when possible. You may have a close family member that lives with you that can help. If possible, we also ask that you have a blood pressure cuff and scale at home to measure your blood pressure, heart rate and weight prior to your scheduled appointment. Patients with clinical needs that need an in-person evaluation and testing will still be able to come to the office if absolutely necessary. If you have any questions, feel free to call our office.   THE DAY OF YOUR APPOINTMENT  Approximately 15 minutes prior to your scheduled appointment, you will receive a telephone call from one of [facility] team - your caller ID may say "Unknown caller."  Our staff will confirm medications, vital signs for the day and any symptoms you may be experiencing. Please have this information available prior to the time of visit start. It may also be helpful for you to have a pad of paper and pen handy for any instructions given during your visit. They will also walk you through joining the WebEx smartphone meeting if this is a video visit.    CONSENT FOR TELE-HEALTH VISIT - PLEASE REVIEW  I hereby voluntarily request, consent and authorize [facility] and its employed or contracted physicians, physician assistants, nurse practitioners or other licensed health care  professionals (the Practitioner), to provide me with telemedicine health care services (the "Services") as deemed necessary by the treating Practitioner. I acknowledge and consent to receive the Services by the Practitioner via telemedicine. I understand that the telemedicine visit will involve communicating with the Practitioner through live audiovisual communication technology and the disclosure of certain medical information by electronic transmission. I acknowledge that I have been given the opportunity to request an in-person assessment or other available alternative prior to the telemedicine visit and am voluntarily participating in the telemedicine visit.  I understand that I have the right to withhold or withdraw my consent to the use of telemedicine in the course of my care at any time, without affecting my right to future care or treatment, and that the Practitioner or I may terminate the telemedicine visit at any time. I understand that I have the right to inspect all information obtained and/or recorded in the course of the telemedicine visit and may receive copies of available information for a reasonable fee.  I understand that some of the potential risks of receiving the Services via telemedicine include:  . Delay or interruption in medical evaluation due to technological equipment failure or disruption; . Information transmitted may not be sufficient (e.g. poor resolution of images) to allow for appropriate medical decision making by the Practitioner; and/or  . In rare instances, security protocols could fail, causing a breach of personal health information.  Furthermore, I acknowledge that it is my responsibility to provide information about my medical history, conditions and care that is complete and accurate   to the best of my ability. I acknowledge that Practitioner's advice, recommendations, and/or decision may be based on factors not within their control, such as incomplete or inaccurate  data provided by me or distortions of diagnostic images or specimens that may result from electronic transmissions. I understand that the practice of medicine is not an exact science and that Practitioner makes no warranties or guarantees regarding treatment outcomes. I acknowledge that I will receive a copy of this consent concurrently upon execution via email to the email address I last provided but may also request a printed copy by calling the office of [facility].    I understand that my insurance will be billed for this visit.   I have read or had this consent read to me. . I understand the contents of this consent, which adequately explains the benefits and risks of the Services being provided via telemedicine.  . I have been provided ample opportunity to ask questions regarding this consent and the Services and have had my questions answered to my satisfaction. . I give my informed consent for the services to be provided through the use of telemedicine in my medical care  By participating in this telemedicine visit I agree to the above.   

## 2019-03-21 NOTE — Telephone Encounter (Signed)
Forms received. Will discuss with Dr Marlou Sa next week when he returns to office.

## 2019-03-24 NOTE — Telephone Encounter (Signed)
Completed and emailed back to patient.

## 2019-03-24 NOTE — Progress Notes (Signed)
Virtual Visit via Video Note   This visit type was conducted due to national recommendations for restrictions regarding the COVID-19 Pandemic (e.g. social distancing) in an effort to limit this patient's exposure and mitigate transmission in our community.  Due to his co-morbid illnesses, this patient is at least at moderate risk for complications without adequate follow up.  This format is felt to be most appropriate for this patient at this time.  All issues noted in this document were discussed and addressed.  A limited physical exam was performed with this format.  Please refer to the patient's chart for his consent to telehealth for [facility].   Evaluation Performed:  Follow-up visit  Date:  03/28/2019   ID:  [place], DOB 12-27-1950, MRN [id number]  Patient Location: Home  Provider Location: Office  PCP:  [name], MD  Cardiologist:  [name], MD   Electrophysiologist:  None   Chief Complaint:  Aortic Stenosis / Aneurysm Bicuspid AV  History of Present Illness:    Jose [name] is a 68 y.o. male who presents for f/u vascular disease and bicuspid AV with moderate AS   Followed by [name] for ascending aortic dilatations.  Reviewed CTA 09/12/18  and measured stable 4.5 cm  The patient has a history of intracranial bleed/ subdural hematoma without history of trauma treated in [place]. He underwent right craniotomy. 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 There is no family history of aortic aneurysms, unexplained sudden death at an early age or aortic dissection.He has one cousin who died in his 39s of sudden collapse thought to be a brain aneurysm  TTE done 02/18/18 with moderate AS some progression of gradients with mean 30 peak 58 mmHg  TTE done 02/28/19 stable moderate AS mean gradient 31 peak 59 mmHG  Had a nonischemic myovue 02/21/17 EF estimated 46% but low normal on echo .  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    Past Medical History:  Diagnosis Date  . Arthritis    R shoulder, great toes, hands- has gout & has injections in knees with Dr. Estanislado Pandy   . Cancer ([place])    skin ca-   . GERD (gastroesophageal reflux disease)   . History of hiatal hernia   . SDH (subdural hematoma) (HCC)    Past Surgical History:  Procedure Laterality Date  . BRAIN SURGERY  2016   evacuation of SDH  . CARPAL TUNNEL RELEASE Bilateral   . GREEN LIGHT LASER TURP (TRANSURETHRAL RESECTION OF PROSTATE  04/27/2017  . KNEE ARTHROPLASTY    . QUADRICEPS TENDON REPAIR Bilateral 06/15/2017   Procedure: REPAIR QUADRICEP TENDON;  Surgeon: Meredith Pel, MD;  Location: [facility];  Service: Orthopedics;  Laterality: Bilateral;  . SHOULDER SURGERY Right      Current Meds  Medication  Sig  . acetaminophen (TYLENOL) 500 MG tablet Take 500-1,000 mg by mouth every 6 (six) hours as needed for mild pain.  Marland Kitchen allopurinol (ZYLOPRIM) 300 MG tablet TAKE 1 TABLET BY MOUTH EVERY DAY  . atorvastatin (LIPITOR) 10 MG tablet Take 1 tablet (10 mg total) by mouth daily at 6 PM.  . esomeprazole (NEXIUM) 40 MG capsule Take 1 capsule (40 mg total) by mouth daily.  . metoprolol succinate (TOPROL-XL) 25 MG 24 hr tablet TAKE 1 TABLET (25 MG TOTAL) BY MOUTH DAILY. TAKE WITH OR IMMEDIATELY FOLLOWING A MEAL.  . Multiple Vitamin (MULTIVITAMIN WITH MINERALS) TABS  tablet Take 1 tablet by mouth daily.  Marland Kitchen UNABLE TO FIND 1 tablet as needed. Med Name: Col-quit     Allergies:   Quinolones   Social History   Tobacco Use  . Smoking status: Never Smoker  . Smokeless tobacco: Never Used  Substance Use Topics  . Alcohol use: Yes    Alcohol/week: 8.0 standard drinks    Types: 7 Glasses of wine, 1 Shots of liquor per week  . Drug use: No     Family Hx: The patient's family history includes Diabetes in his brother; Heart disease in his father; Parkinson's disease in his brother and mother.  ROS:   Please see the history of present illness.     All other systems reviewed and are negative.   Prior CV studies:   The following studies were reviewed today:  Echo 02/28/19 Myovue 02/21/17  Labs/Other Tests and Data Reviewed:    EKG:  SR rate 58 borderline LVH 10/17/18  Recent Labs: 10/14/2018: Hemoglobin 14.1; Platelets 196 11/12/2018: BUN 19; Creat 1.09; Potassium 4.2; Sodium 142 03/03/2019: ALT 39   Recent Lipid Panel Lab Results  Component Value Date/Time   CHOL 204 (H) 03/03/2019 12:09 PM   TRIG 163 (H) 03/03/2019 12:09 PM   HDL 61 03/03/2019 12:09 PM   CHOLHDL 3.3 03/03/2019 12:09 PM   CHOLHDL 3.7 CALC 12/25/2007 04:36 PM   LDLCALC 110 (H) 03/03/2019 12:09 PM   LDLDIRECT 164.4 12/25/2007 04:36 PM    Wt Readings from Last 3 Encounters:  03/28/19 87.1 kg  01/27/19 89.4 kg  11/21/18 93.2 kg     Objective:    Vital Signs:  Ht 6' (1.829 m)   Wt 87.1 kg   BMI 26.04 kg/m    White male  in no distress Previous right craniotomy No tachypnea  JVP not elevated Skin warm and dry  No edema    ASSESSMENT & PLAN:    1. Aortic Stenosis : stable moderate f/u echo in March 2021 2. Aneurysm:  4.5 cm stable by CT 09/12/18 f/u CVTS 3. HLD:  On statin labs with primary   COVID-19 Education: The signs and symptoms of COVID-19 were discussed with the patient and how to seek care for testing (follow up with PCP or arrange E-visit).  The  importance of social distancing was discussed today.  Time:   Today, I have spent 30 minutes with the patient with telehealth technology discussing the above problems.     Medication Adjustments/Labs and Tests Ordered: Current medicines are reviewed at length with the patient today.  Concerns regarding medicines are outlined above.  Tests Ordered: No orders of the defined types were placed in this encounter.  Medication Changes: No orders of the defined types were placed in this encounter.   Disposition:  Follow up F/U in a year with echo for AS  Signed, [name], MD  03/28/2019  8:[place]

## 2019-03-28 ENCOUNTER — Telehealth (INDEPENDENT_AMBULATORY_CARE_PROVIDER_SITE_OTHER): Payer: Medicare Other | Admitting: Cardiovascular Disease

## 2019-03-28 ENCOUNTER — Other Ambulatory Visit: Payer: Self-pay

## 2019-03-28 ENCOUNTER — Encounter: Payer: Self-pay | Admitting: Cardiovascular Disease

## 2019-03-28 VITALS — Ht 72.0 in | Wt 192.0 lb

## 2019-03-28 DIAGNOSIS — I350 Nonrheumatic aortic (valve) stenosis: Secondary | ICD-10-CM

## 2019-03-28 NOTE — Patient Instructions (Addendum)
Medication Instructions:   If you need a refill on your cardiac medications before your next appointment, please call your pharmacy.   Lab work: Your physician recommends that you return for lab work in: 8 weeks for fasting lipid and liver panel.  If you have labs (blood work) drawn today and your tests are completely normal, you will receive your results only by: Marland Kitchen MyChart Message (if you have MyChart) OR . A paper copy in the mail If you have any lab test that is abnormal or we need to change your treatment, we will call you to review the results.  Testing/Procedures: Your physician has requested that you have cardiac CT in June. Cardiac computed tomography (CT) is a painless test that uses an x-ray machine to take clear, detailed pictures of your heart. For further information please visit [email or web address]. Please follow instruction sheet as given.  Follow-Up: At [facility], you and your health needs are our priority.  As part of our continuing mission to provide you with exceptional heart care, we have created designated Provider Care Teams.  These Care Teams include your primary Cardiologist (physician) and Advanced Practice Providers (APPs -  Physician Assistants and Nurse Practitioners) who all work together to provide you with the care you need, when you need it. You will need a follow up appointment in July. You may see [name], MD or one of the following Advanced Practice Providers on your designated Care Team:   Truitt Merle, NP Cecilie Kicks, NP . Kathyrn Drown, NP

## 2019-03-31 ENCOUNTER — Other Ambulatory Visit: Payer: Self-pay | Admitting: Cardiovascular Disease

## 2019-03-31 ENCOUNTER — Ambulatory Visit: Payer: Medicare Other | Admitting: Cardiovascular Disease

## 2019-04-03 ENCOUNTER — Other Ambulatory Visit: Payer: Self-pay

## 2019-04-03 DIAGNOSIS — E785 Hyperlipidemia, unspecified: Secondary | ICD-10-CM

## 2019-04-03 DIAGNOSIS — Z79899 Other long term (current) drug therapy: Secondary | ICD-10-CM

## 2019-04-03 NOTE — Progress Notes (Signed)
Placed order for lipid and liver panel

## 2019-04-08 NOTE — Progress Notes (Signed)
Virtual Visit via Video Note  I connected with Jose [name] on 04/08/19 at  8:15 AM EDT by a video enabled telemedicine application and verified that I am speaking with the correct person using two identifiers.   I discussed the limitations of evaluation and management by telemedicine and the availability of in person appointments. The patient expressed understanding and agreed to proceed. This service was conducted via virtual visit.  Both audio and visual tools were used.  The patient was located at home. I was located in my office.  Consent was obtained prior to the virtual visit and is aware of possible charges through their insurance for this visit.  The patient is an established patient.  Dr. Estanislado Pandy, MD conducted the virtual visit and Hazel Sams, PA-C acted as scribe during the service.  Office staff helped with scheduling follow up visits after the service was conducted.   CC: Medication monitoring   History of Present Illness: Patient is a 68 year old male with a past medical history of gout and osteoarthritis.  He is taking Allopurinol 300 mg po daily for management of gout.  He has not had any recent gout flares.  He has no joint pain or joint swelling.  His trigger fingers resolved after the cortisone injections.  He has some difficulty climbing steps due to bilateral knee joint pain and lower back pain.   Review of Systems  Constitutional: Negative for fever and malaise/fatigue.  Eyes: Negative for photophobia, pain, discharge and redness.  Respiratory: Negative for cough, shortness of breath and wheezing.   Cardiovascular: Negative for chest pain and palpitations.  Gastrointestinal: Negative for blood in stool, constipation and diarrhea.  Genitourinary: Negative for dysuria.  Musculoskeletal: Negative for back pain, joint pain, myalgias and neck pain.       +Morning stiffness   Skin: Negative for rash.  Neurological: Negative for dizziness and headaches.   Psychiatric/Behavioral: Negative for depression. The patient is not nervous/anxious and does not have insomnia.       Observations/Objective: Physical Exam  Constitutional: He is oriented to person, place, and time and well-developed, well-nourished, and in no distress.  HENT:  Head: Normocephalic and atraumatic.  Eyes: Conjunctivae are normal.  Pulmonary/Chest: Effort normal.  Neurological: He is alert and oriented to person, place, and time.  Psychiatric: Mood, memory, affect and judgment normal.   Patient reports morning stiffness for 3-4  minutes.   Patient denies nocturnal pain.  Difficulty dressing/grooming: Denies Difficulty climbing stairs: Reports Difficulty getting out of chair: Reports Difficulty using hands for taps, buttons, cutlery, and/or writing: Denies  He has complete fist formation.  No triggering or locking noted.   Assessment and Plan: Idiopathic chronic gout of multiple sites without tophus -He has not had any recent gout flares.  He has no joint pain or joint swelling at this time.  He is taking Allopurinol 300 mg po daily and colchicine 0.6 mg prn.  He will continue on this current treatment regimen.  Uric acid was 2.8 on 04/14/19.  He does not need any refills at this time.  He was advised to notify us if he develops signs or symptoms of a gout flare.  He will follow up in 6 months.   Primary osteoarthritis of both feet-He has no joint pain or joint swelling at this   Neuropathy -He has underlying neuropathy and is followed by a neurologist.    Primary osteoarthritis of both hands-He has no hand pain or joint swelling at this time.  He has complete fist formation bilaterally.  Joint protection and muscle strengthening were discussed.   Primary osteoarthritis of both knees-He has no knee joint pain or swelling at this time.  He has some difficulty climbing steps.    Trigger little finger of right hand -  He had a cortisone injection on 08/06/2018.  His  symptoms have resolved.  Trigger thumb, left thumb -  He had a cortisone injection on 08/06/2018, Resolved.  Other medical problems are listed as follows:  [place]  Rupture of quadriceps tendon, right, sequela - Dr. Marlou Sa: He has some difficulty going up and down steps.    Quadriceps tendon rupture, left, sequela - Dr. Marlou Sa: He continues to have difficulty climbing steps.  Subdural hematoma (HCC)   Follow Up Instructions: He will follow up in 6 months.  Lab work will be updated at his follow up visit.    I discussed the assessment and treatment plan with the patient. The patient was provided an opportunity to ask questions and all were answered. The patient agreed with the plan and demonstrated an understanding of the instructions.   The patient was advised to call back or seek an in-person evaluation if the symptoms worsen or if the condition fails to improve as anticipated.  I provided 25 minutes of non-face-to-face time during this encounter. Bo Merino, MD   Scribed by-  Ofilia Neas, PA-C

## 2019-04-09 ENCOUNTER — Telehealth: Payer: Self-pay | Admitting: Rheumatology

## 2019-04-09 NOTE — Telephone Encounter (Signed)
Patient called requesting his labwork orders be sent to Jose [name].  Patient was not sure which location he was going to go to, but said if it is released in the system he should be able to go to any [facility].  Patient states he plans to go tomorrow 4/23 or Friday 4/24 so Dr. Estanislado Pandy will have results before virtual webex appointment on 4/28.

## 2019-04-10 ENCOUNTER — Other Ambulatory Visit: Payer: Self-pay | Admitting: *Deleted

## 2019-04-10 DIAGNOSIS — M1A09X0 Idiopathic chronic gout, multiple sites, without tophus (tophi): Secondary | ICD-10-CM

## 2019-04-10 DIAGNOSIS — Z79899 Other long term (current) drug therapy: Secondary | ICD-10-CM

## 2019-04-10 NOTE — Telephone Encounter (Signed)
Lab Orders released.  

## 2019-04-15 ENCOUNTER — Other Ambulatory Visit: Payer: Self-pay

## 2019-04-15 ENCOUNTER — Telehealth (INDEPENDENT_AMBULATORY_CARE_PROVIDER_SITE_OTHER): Payer: Medicare Other | Admitting: Rheumatology

## 2019-04-15 ENCOUNTER — Encounter: Payer: Self-pay | Admitting: Rheumatology

## 2019-04-15 DIAGNOSIS — M19041 Primary osteoarthritis, right hand: Secondary | ICD-10-CM

## 2019-04-15 DIAGNOSIS — M19072 Primary osteoarthritis, left ankle and foot: Secondary | ICD-10-CM

## 2019-04-15 DIAGNOSIS — M65312 Trigger thumb, left thumb: Secondary | ICD-10-CM

## 2019-04-15 DIAGNOSIS — G629 Polyneuropathy, unspecified: Secondary | ICD-10-CM

## 2019-04-15 DIAGNOSIS — M19071 Primary osteoarthritis, right ankle and foot: Secondary | ICD-10-CM

## 2019-04-15 DIAGNOSIS — S065X9A Traumatic subdural hemorrhage with loss of consciousness of unspecified duration, initial encounter: Secondary | ICD-10-CM

## 2019-04-15 DIAGNOSIS — M65351 Trigger finger, right little finger: Secondary | ICD-10-CM

## 2019-04-15 DIAGNOSIS — S065XAA Traumatic subdural hemorrhage with loss of consciousness status unknown, initial encounter: Secondary | ICD-10-CM

## 2019-04-15 DIAGNOSIS — A809 Acute poliomyelitis, unspecified: Secondary | ICD-10-CM

## 2019-04-15 DIAGNOSIS — S76111S Strain of right quadriceps muscle, fascia and tendon, sequela: Secondary | ICD-10-CM

## 2019-04-15 DIAGNOSIS — M1A09X0 Idiopathic chronic gout, multiple sites, without tophus (tophi): Secondary | ICD-10-CM

## 2019-04-15 DIAGNOSIS — M19042 Primary osteoarthritis, left hand: Secondary | ICD-10-CM

## 2019-04-15 DIAGNOSIS — S76112S Strain of left quadriceps muscle, fascia and tendon, sequela: Secondary | ICD-10-CM

## 2019-04-15 DIAGNOSIS — M170 Bilateral primary osteoarthritis of knee: Secondary | ICD-10-CM

## 2019-04-15 LAB — CBC WITH DIFFERENTIAL/PLATELET
Basophils Absolute: 0 10*3/uL (ref 0.0–0.2)
Basos: 1 %
EOS (ABSOLUTE): 0.1 10*3/uL (ref 0.0–0.4)
Eos: 2 %
Hematocrit: 41.8 % (ref 37.5–51.0)
Hemoglobin: 14.5 g/dL (ref 13.0–17.7)
Immature Grans (Abs): 0 10*3/uL (ref 0.0–0.1)
Immature Granulocytes: 1 %
Lymphocytes Absolute: 1.3 10*3/uL (ref 0.7–3.1)
Lymphs: 19 %
MCH: 31.7 pg (ref 26.6–33.0)
MCHC: 34.7 g/dL (ref 31.5–35.7)
MCV: 92 fL (ref 79–97)
Monocytes Absolute: 0.5 10*3/uL (ref 0.1–0.9)
Monocytes: 7 %
Neutrophils Absolute: 4.7 10*3/uL (ref 1.4–7.0)
Neutrophils: 70 %
Platelets: 201 10*3/uL (ref 150–450)
RBC: 4.57 x10E6/uL (ref 4.14–5.80)
RDW: 13.7 % (ref 11.6–15.4)
WBC: 6.6 10*3/uL (ref 3.4–10.8)

## 2019-04-15 LAB — CMP14+EGFR
ALT: 38 IU/L (ref 0–44)
AST: 23 IU/L (ref 0–40)
Albumin/Globulin Ratio: 2.2 (ref 1.2–2.2)
Albumin: 4.3 g/dL (ref 3.8–4.8)
Alkaline Phosphatase: 75 IU/L (ref 39–117)
BUN/Creatinine Ratio: 19 (ref 10–24)
BUN: 18 mg/dL (ref 8–27)
Bilirubin Total: 0.3 mg/dL (ref 0.0–1.2)
CO2: 22 mmol/L (ref 20–29)
Calcium: 9.8 mg/dL (ref 8.6–10.2)
Chloride: 107 mmol/L — ABNORMAL HIGH (ref 96–106)
Creatinine, Ser: 0.96 mg/dL (ref 0.76–1.27)
GFR calc Af Amer: 93 mL/min/{1.73_m2} (ref >59)
GFR calc non Af Amer: 80 mL/min/{1.73_m2} (ref >59)
Globulin, Total: 2 g/dL (ref 1.5–4.5)
Glucose: 128 mg/dL — ABNORMAL HIGH (ref 65–99)
Potassium: 5 mmol/L (ref 3.5–5.2)
Sodium: 145 mmol/L — ABNORMAL HIGH (ref 134–144)
Total Protein: 6.3 g/dL (ref 6.0–8.5)

## 2019-04-15 LAB — URIC ACID: Uric Acid: 2.8 mg/dL — ABNORMAL LOW (ref 3.7–8.6)

## 2019-04-16 ENCOUNTER — Telehealth: Payer: Self-pay | Admitting: Rheumatology

## 2019-04-16 NOTE — Telephone Encounter (Signed)
-----   Message from [place] sent at 04/15/2019  3:14 PM EDT ----- Patient had a virtual visit today with Dr. Estanislado Pandy. Please call to schedule 6 month follow up. Thanks!

## 2019-04-16 NOTE — Telephone Encounter (Signed)
I LMOM for patient to call, and schedule his next 6 month follow up appt with Dr. Estanislado Pandy.

## 2019-04-29 ENCOUNTER — Telehealth: Payer: Self-pay | Admitting: Cardiovascular Disease

## 2019-04-29 DIAGNOSIS — E785 Hyperlipidemia, unspecified: Secondary | ICD-10-CM

## 2019-04-29 DIAGNOSIS — Z79899 Other long term (current) drug therapy: Secondary | ICD-10-CM

## 2019-04-29 NOTE — Telephone Encounter (Signed)
Patient returned call.  I advised him orders have been changed and he is able to go to any [facility] for lab work.

## 2019-04-29 NOTE — Telephone Encounter (Signed)
New Message:    Pt is scheduled for lab work on Friday. He is in [place] at this time. He wants to know if he can get is lab work down there please?

## 2019-04-29 NOTE — Telephone Encounter (Signed)
Left message for patient to call back. Will change order for patient to go to any [facility] for lab work.

## 2019-04-29 NOTE — Telephone Encounter (Signed)
Called patient back. Informed him that he should be able to go to any [facility] and get lab work done. Patient verbalized understanding.

## 2019-05-02 ENCOUNTER — Other Ambulatory Visit: Payer: Medicare Other

## 2019-05-07 ENCOUNTER — Telehealth: Payer: Self-pay | Admitting: Cardiovascular Disease

## 2019-05-07 ENCOUNTER — Telehealth: Payer: Self-pay

## 2019-05-07 DIAGNOSIS — E785 Hyperlipidemia, unspecified: Secondary | ICD-10-CM

## 2019-05-07 LAB — HEPATIC FUNCTION PANEL
ALT: 40 IU/L (ref 0–44)
AST: 29 IU/L (ref 0–40)
Albumin: 4.1 g/dL (ref 3.8–4.8)
Alkaline Phosphatase: 74 IU/L (ref 39–117)
Bilirubin Total: 0.6 mg/dL (ref 0.0–1.2)
Bilirubin, Direct: 0.18 mg/dL (ref 0.00–0.40)
Total Protein: 6.2 g/dL (ref 6.0–8.5)

## 2019-05-07 LAB — LIPID PANEL
Chol/HDL Ratio: 2.6 ratio (ref 0.0–5.0)
Cholesterol, Total: 165 mg/dL (ref 100–199)
HDL: 63 mg/dL (ref >39)
LDL Calculated: 75 mg/dL (ref 0–99)
Triglycerides: 135 mg/dL (ref 0–149)
VLDL Cholesterol Cal: 27 mg/dL (ref 5–40)

## 2019-05-07 NOTE — Telephone Encounter (Signed)
-----   Message from Josue Hector, MD sent at 05/07/2019  7:54 AM EDT ----- Cholesterol is at goal and LFT's are normal F/U labs in 6 months

## 2019-05-07 NOTE — Telephone Encounter (Signed)
Per Dr. Johnsie Cancel, cholesterol is at goal and liver function test is normal. We would like to repeat these labs in 6 months. Please give our office a call to schedule, we can also order them with [facility] for you to have done any where there is a [facility].

## 2019-05-07 NOTE — Telephone Encounter (Signed)
Patient aware of lab results.

## 2019-05-07 NOTE — Telephone Encounter (Signed)
New Message             Patient is needing a call back from [facility], patient has questions.

## 2019-05-19 ENCOUNTER — Telehealth (HOSPITAL_COMMUNITY): Payer: Self-pay | Admitting: Emergency Medicine

## 2019-05-19 NOTE — Telephone Encounter (Signed)
Reaching out to patient to offer assistance regarding upcoming cardiac imaging study; pt verbalizes understanding of appt date/time, parking situation and where to check in, pre-test NPO status and medications ordered, and verified current allergies; name and call back number provided for further questions should they arise Verleen Stuckey RN Navigator Cardiac Imaging [facility] [phone] office [phone] cell  Pt denies covid symptoms, verbalized understanding of visitor policy. 

## 2019-05-20 ENCOUNTER — Other Ambulatory Visit: Payer: Self-pay

## 2019-05-20 ENCOUNTER — Ambulatory Visit (HOSPITAL_COMMUNITY)
Admission: RE | Admit: 2019-05-20 | Discharge: 2019-05-20 | Disposition: A | Discharge status: Home / Self Care | Payer: Medicare Other | Source: Ambulatory Visit | Attending: Cardiovascular Disease | Admitting: Cardiovascular Disease

## 2019-05-20 ENCOUNTER — Ambulatory Visit (HOSPITAL_COMMUNITY): Payer: Medicare Other

## 2019-05-20 DIAGNOSIS — I350 Nonrheumatic aortic (valve) stenosis: Secondary | ICD-10-CM | POA: Insufficient documentation

## 2019-05-20 MED ORDER — NITROGLYCERIN 0.4 MG SL SUBL
0.8000 mg | SUBLINGUAL_TABLET | Freq: Once | SUBLINGUAL | Status: AC
  Administered 2019-05-20: 0.8 mg via SUBLINGUAL

## 2019-05-20 MED ORDER — IOHEXOL 350 MG/ML SOLN
80.0000 mL | Freq: Once | INTRAVENOUS | Status: AC | PRN
  Administered 2019-05-20: 11:00:00 80 mL via INTRAVENOUS

## 2019-05-20 MED ORDER — NITROGLYCERIN 0.4 MG SL SUBL
SUBLINGUAL_TABLET | SUBLINGUAL | Status: AC
  Filled 2019-05-20: qty 2

## 2019-05-27 ENCOUNTER — Telehealth: Payer: Self-pay | Admitting: Cardiovascular Disease

## 2019-05-27 NOTE — Telephone Encounter (Signed)
New Message:     Pt said he had a CT on 05-20-19. Dr Madolyn Frieze have him scheduled for a CT on 05-29-19.  He though [name] had said both CT were not necessary. Would you please check with [name] and see if he is supposed to have both .

## 2019-05-27 NOTE — Telephone Encounter (Signed)
Called patient about canceling his other CT. Patient wanted to know if he needed to keep appointment with Dr. Servando Snare. Informed patient that a message would be sent to Dr. Servando Snare for advisement.

## 2019-05-27 NOTE — Telephone Encounter (Signed)
Cancel CT on 6/11 had cardiac CT on 6/2 already

## 2019-05-29 ENCOUNTER — Ambulatory Visit (INDEPENDENT_AMBULATORY_CARE_PROVIDER_SITE_OTHER): Payer: Medicare Other | Admitting: Cardiothoracic Surgery

## 2019-05-29 ENCOUNTER — Encounter: Payer: Self-pay | Admitting: Cardiothoracic Surgery

## 2019-05-29 ENCOUNTER — Other Ambulatory Visit: Payer: Self-pay

## 2019-05-29 ENCOUNTER — Inpatient Hospital Stay: Admission: RE | Admit: 2019-05-29 | Payer: Medicare Other | Source: Ambulatory Visit

## 2019-05-29 VITALS — BP 127/86 | HR 65 | Temp 97.7°F | Resp 20 | Ht 72.0 in | Wt 203.0 lb

## 2019-05-29 DIAGNOSIS — Q231 Congenital insufficiency of aortic valve: Secondary | ICD-10-CM | POA: Diagnosis not present

## 2019-05-29 DIAGNOSIS — I712 Thoracic aortic aneurysm, without rupture, unspecified: Secondary | ICD-10-CM

## 2019-05-29 NOTE — Progress Notes (Signed)
[name]       [place] [address]             [id number]                    Jose [name]  Medical Record #[id number] Date of Birth: 04/30/1951  Referring: Jose Leigh, MD Primary Care: Jose Spurling, MD Primary Cardiology: Jose Rouge, MD Chief Complaint:    Chief Complaint  Patient presents with   Thoracic Aortic Aneurysm    6 month f/u, Cardiac CTA 05/20/19     History of Present Illness:    Jose [name] 67 y.o. male who comes to the office today with a follow-up cardiac CT done by cardiology last week.  The patient also had a follow-up echocardiogram done in March 2020. He was first seen in the office 07/06/2015  referred by the emergency room at [facility]  because of a dilated ascending aorta. The patient has no previous history of cardiac disease, no history of known coronary artery disease or valvular disease.  The patient has a history of intracranial bleed/ subdural hematoma without history of trauma treated in [place].   He underwent right craniotomy.  In July 2016 he was having increasing cough discussed this with his neurologist who recommended that he be seen and have a CAT scan of the head done. While in the emergency room a CT scan of the chest was also done., Demonstrating mild dilatation of the ascending aorta. The patient is a nonsmoker.  He denies any symptoms of angina, echocardiograms confirmed a bicuspid aortic valve Recently noted in March to be at least moderate degree of aortic stenosis  The patient denies history of angina chest pain shortness of breath, evidence of congestive heart failure syncope or near syncope.  History family history is significant for his father who died in his late 57s with myocardial infarction, after having his first myocardial infarction in his 64s. There is no family history of aortic aneurysms, unexplained sudden death at an early age or aortic dissection.He has one cousin who died in his 62s of  sudden collapse thought to be a brain aneurysm   Patient has had  repair of bilateral ruptured quadricep tendons,    Patient denies any signs or symptoms of congestive heart failure, denies angina, denies syncope or presyncope.  Continues to exercise on a regular basis without symptoms but is careful not to do strenuous lifting.  Is  Current Activity/ Functional Status:  Patient is independent with mobility/ambulation, transfers, ADL's, IADL's.   Zubrod Score: At the time of surgery this patients most appropriate activity status/level should be described as: []     0    Normal activity, no symptoms [x]     1    Restricted in physical strenuous activity but ambulatory, able to do out light work []     2    Ambulatory and capable of self care, unable to do work activities, up and about               >50 % of waking hours                              []     3    Only limited self care, in bed greater than 50% of waking hours []     4    Completely disabled, no self care, confined to bed  or chair []     5    Moribund   Past Medical History:  Diagnosis Date   Arthritis    R shoulder, great toes, hands- has gout & has injections in knees with Dr. Estanislado [name]    Cancer [facility])    skin ca-    GERD (gastroesophageal reflux disease)    History of hiatal hernia    SDH (subdural hematoma) ([place])     Past Surgical History:  Procedure Laterality Date   BRAIN SURGERY  2016   evacuation of SDH   CARPAL TUNNEL RELEASE Bilateral    GREEN LIGHT LASER TURP (TRANSURETHRAL RESECTION OF PROSTATE  04/27/2017   KNEE ARTHROPLASTY     QUADRICEPS TENDON REPAIR Bilateral 06/15/2017   Procedure: REPAIR QUADRICEP TENDON;  Surgeon: Jose Pel, MD;  Location: [facility];  Service: Orthopedics;  Laterality: Bilateral;   SHOULDER SURGERY Right     FAmily history: No change in family history since seen 8 months ago Both of the patient's parents are deceased his father died at age 61 of myocardial  infarction previously in his early 44s he had 2 myocardial infarctions. His mother died with Parkinson's at age 60. He has a living brother and sister with history of diabetes Parkinson's and depression, he has one son age 44. He has one cousin who died in his 64s of sudden collapse thought to be a brain aneurysm.   History   Social History   Marital Status: Married    Spouse Name: N/A   Number of Children: N/A   Years of Education: N/A   Occupational History  Patient works as a Optometrist in [employer]   Social History Main Topics   Smoking status: Never Smoker    Smokeless tobacco: Not on file   Alcohol Use: No   Drug Use: No   Sexual Activity: Not on file     Social History   Tobacco Use  Smoking Status Never Smoker  Smokeless Tobacco Never Used    Social History   Substance and Sexual Activity  Alcohol Use Yes   Alcohol/week: 8.0 standard drinks   Types: 7 Glasses of wine, 1 Shots of liquor per week     Allergies  Allergen Reactions   Quinolones     Patient was warned about not using Cipro and similar antibiotics. Recent studies have raised concern that fluoroquinolone antibiotics could be associated with an increased risk of aortic aneurysm Fluoroquinolones have non-antimicrobial properties that might jeopardise the integrity of the extracellular matrix of the vascular wall In a  propensity score matched cohort study in [place], there was a 66% increased rate of aortic aneurysm or dissection associated with oral fluoroquinolone use, compared wit    Current Outpatient Medications  Medication Sig Dispense Refill   acetaminophen (TYLENOL) 500 MG tablet Take 500-1,000 mg by mouth every 6 (six) hours as needed for mild pain.     allopurinol (ZYLOPRIM) 300 MG tablet TAKE 1 TABLET BY MOUTH EVERY DAY 90 tablet 1   atorvastatin (LIPITOR) 10 MG tablet Take 1 tablet (10 mg total) by mouth daily at 6 PM. 90 tablet 3   COLESTIPOL HCL PO Take 5 mg  by mouth daily.      esomeprazole (NEXIUM) 40 MG capsule Take 1 capsule (40 mg total) by mouth daily. 30 capsule 0   metoprolol succinate (TOPROL-XL) 25 MG 24 hr tablet TAKE 1 TABLET (25 MG TOTAL) BY MOUTH DAILY. TAKE WITH OR IMMEDIATELY FOLLOWING A MEAL. 90 tablet 1  Multiple Vitamin (MULTIVITAMIN WITH MINERALS) TABS tablet Take 1 tablet by mouth daily.     UNABLE TO FIND 1 tablet as needed. Med Name: Col-quit     No current facility-administered medications for this visit.       Review of Systems:  Review of Systems  Constitutional: Negative.   HENT: Negative.   Respiratory: Negative.   Cardiovascular: Negative.   Gastrointestinal: Negative.   Genitourinary: Negative.   Musculoskeletal: Positive for joint pain.  Skin: Negative.   Neurological: Negative.   Endo/Heme/Allergies: Negative.   Psychiatric/Behavioral: Negative.     Physical Exam: BP 127/86    Pulse 65    Temp 97.7 F (36.5 C) (Skin)    Resp 20    Ht 6' (1.829 m)    Wt 203 lb (92.1 kg)    SpO2 98% Comment: RA   BMI 27.53 kg/m   PHYSICAL EXAMINATION: General appearance: alert, cooperative and no distress Head: Normocephalic, without obvious abnormality, atraumatic Neck: no adenopathy, no carotid bruit, no JVD, supple, symmetrical, trachea midline and thyroid not enlarged, symmetric, no tenderness/mass/nodules Lymph nodes: Cervical, supraclavicular, and axillary nodes normal. Resp: clear to auscultation bilaterally Cardio: systolic murmur: early systolic 2/6, crescendo at 2nd left intercostal space GI: soft, non-tender; bowel sounds normal; no masses,  no organomegaly Extremities: extremities normal, atraumatic, no cyanosis or edema and Homans sign is negative, no sign of DVT Neurologic: Grossly normal  DP and PT pulses 2+ bilaterally radial pulses and radial pulses are equal bilaterally Brace on left knee no palpable popliteal aneurysm appreciated  Diagnostic Studies & Laboratory data:     Recent  Radiology Findings:  Ct Coronary Morph W/cta Cor W/score W/ca W/cm &/or Wo/cm  Addendum Date: 05/20/2019   ADDENDUM REPORT: 05/20/2019 13:41 CLINICAL DATA:  Chest pain EXAM: Cardiac CTA MEDICATIONS: Sub lingual nitro. 4 mg and lopressor 50mg  PO TECHNIQUE: The patient was scanned on a [employer] 192 scanner. Gantry rotation speed was 250 msecs. Collimation was. 6 mm . A 120 kV prospective scan was triggered in the ascending thoracic aorta at 140 HU's with full mA between 30-70% of the R-R interval . Average HR during the scan was 58 bpm. The 3D data set was interpreted on a dedicated work station using MPR, MIP and VRT modes. A total of 80 cc of contrast was used. FINDINGS: Non-cardiac: See separate report from [facility]. No significant findings on limited lung and soft tissue windows. Calcium score: Minor calcium noted in proximal and mid LAD Coronary Arteries: Right dominant with no anomalies LM: Normal LAD: Less than 25% calcific plaque in proximal and mid LAD IM: Normal D1: Normal Circumflex: Normal OM1: Normal OM2: Normal s RCA: Normal PDA: Normal PLA: Normal Aortic valve is calcified IMPRESSION: 1. Calcium score 3 which is only 18% for age and sex matched controls 2. Ascending aortic root aneurysm stable at 4.3 cm in orthogonal views 3. Non obstructive CAD in proximal and mid LAD CAD-RADS 1 see description above 4. Bicuspid Aortic valve with calcification suggest echo to assess gradients Jose [name] Electronically Signed   By: Jose [name] M.D.   On: 05/20/2019 13:41   Result Date: 05/20/2019 EXAM: OVER-READ INTERPRETATION  CT CHEST The following report is an over-read performed by radiologist Dr. Rolm Baptise of [facility], [place] on 05/20/2019. This over-read does not include interpretation of cardiac or coronary anatomy or pathology. The coronary CTA interpretation by the cardiologist is attached. COMPARISON:  Chest CT 09/12/2018 FINDINGS: Vascular: Ascending thoracic aortic aneurysm  again noted measuring 4.3 cm. Heart is normal size. No aortic dissection. Mediastinum/Nodes: No mediastinal, hilar, or axillary adenopathy. Trachea and esophagus are unremarkable. Lungs/Pleura: Visualized lungs clear.  No effusions. Upper Abdomen: Imaging into the upper abdomen shows no acute findings. Suspect diffuse fatty infiltration of the liver. Musculoskeletal: Chest wall soft tissues are unremarkable. No acute bony abnormality. IMPRESSION: 4.3 cm ascending thoracic aortic aneurysm compared to 4.5 cm previously. Recommend annual imaging followup by CTA or MRA. This recommendation follows 2010 ACCF/AHA/AATS/ACR/ASA/SCA/SCAI/SIR/STS/SVM Guidelines for the Diagnosis and Management of Patients with Thoracic Aortic Disease. Circulation. 2010; 121: N053-Z767. Aortic aneurysm NOS (ICD10-I71.9) Suspect fatty infiltration of the liver. No acute extra cardiac abnormality. Electronically Signed: By: Rolm Baptise M.D. On: 05/20/2019 10:54   Ct Angio Chest Aorta W &/or Wo Contrast  Result Date: 09/12/2018 CLINICAL DATA:  Follow-up thoracic aortic aneurysm EXAM: CT ANGIOGRAPHY CHEST WITH CONTRAST TECHNIQUE: Multidetector CT imaging of the chest was performed using the standard protocol during bolus administration of intravenous contrast. Multiplanar CT image reconstructions and MIPs were obtained to evaluate the vascular anatomy. CONTRAST:  6mL ISOVUE-370 IOPAMIDOL (ISOVUE-370) INJECTION 76% Creatinine was obtained on site at [place] at [address] Results: Creatinine 1.1 mg/dL.  GFR 69 COMPARISON:  01/17/2018 FINDINGS: Cardiovascular: The thoracic aorta demonstrates a normal branching pattern. Dilatation of the ascending aorta is again identified a 4.5 cm in greatest diameter. Calcific changes of the aortic valve are noted. Bicuspid aortic valve is again noted. At the level of the sinus of Valsalva, the aorta measures approximately 4.2 cm. At the sino-tubular junction the measurement is  approximately 3.5 cm. These are roughly stable from the prior exam given some slight variation in the imaging technique. No dissection is noted. And normal tapering of the thoracic aortic arch and descending aorta is seen. Visualized visceral or vessels in the abdomen are within normal limits. No cardiac enlargement is seen. The pulmonary artery although not timed for embolus evaluation appears within normal limits. No significant coronary calcifications are noted. Mediastinum/Nodes: Thoracic inlet is again within normal limits. No hilar or mediastinal adenopathy is noted. The esophagus as visualized is within normal limits. Lungs/Pleura: Lungs are again well aerated without focal infiltrate or sizable effusion. Upper Abdomen: Fatty infiltration of the liver is again seen. The upper abdomen is otherwise within normal limits. Musculoskeletal: Degenerative changes of the thoracic spine are noted. No compression deformities are seen. No other bony abnormality is noted. Review of the MIP images confirms the above findings. IMPRESSION: Stable appearance of ascending aortic aneurysm to 4.5 cm. Ascending thoracic aortic aneurysm. Recommend semi-annual imaging followup by CTA or MRA. This recommendation follows 2010 ACCF/AHA/AATS/ACR/ASA/SCA/SCAI/SIR/STS/SVM Guidelines for the Diagnosis and Management of Patients With Thoracic Aortic Disease. Circulation. 2010; 121: H419-F790 Bicuspid aortic valve with calcifications. Aortic aneurysm NOS (ICD10-I71.9). Electronically Signed   By: Inez Catalina M.D.   On: 09/12/2018 13:20   I have independently reviewed the above radiology studies  and reviewed the findings with the patient.    Ct Angio Chest Aorta W/cm &/or Wo/cm  Result Date: 01/17/2018 CLINICAL DATA:  Followup thoracic aortic aneurysm EXAM: CT ANGIOGRAPHY CHEST WITH CONTRAST TECHNIQUE: Multidetector CT imaging of the chest was performed using the standard protocol during bolus administration of intravenous  contrast. Multiplanar CT image reconstructions and MIPs were obtained to evaluate the vascular anatomy. CONTRAST:  75 mL Isovue 370. Creatinine was obtained on site at [place] at [address] Results: Creatinine 1.2 mg/dL. COMPARISON:  Coronary CT  angiogram from 11/22/2017 FINDINGS: Cardiovascular: Thoracic aorta again demonstrates dilatation of the ascending aorta. It measures approximately 4.5 x 4.3 cm in greatest dimension at the level of the main pulmonary artery. It measures approximately 4.1 cm at the level of the sinus of Valsalva. The sino-tubular junction measures 3.6 cm stable from the prior exam. Bicuspid aortic valve is again noted with calcifications similar to that seen on the recent exam. No significant coronary calcifications are noted. No cardiac enlargement is noted. The pulmonary artery as visualized is within normal limits. Mediastinum/Nodes: Thoracic inlet is within normal limits. No hilar or mediastinal adenopathy is noted. The esophagus is within normal limits. Lungs/Pleura: The lungs are well aerated bilaterally without evidence of focal infiltrate or sizable effusion. Upper Abdomen: No acute abnormality is noted in the upper abdomen. Fatty infiltration of the liver is seen. Musculoskeletal: Degenerative changes of the thoracic spine are noted. No acute bony abnormality is seen. Review of the MIP images confirms the above findings. IMPRESSION: Overall stable appearance of the ascending aortic aneurysm measuring 4.5 cm in greatest dimension. Recommend semi-annual imaging followup by CTA or MRA. This recommendation follows 2010 ACCF/AHA/AATS/ACR/ASA/SCA/SCAI/SIR/STS/SVM Guidelines for the Diagnosis and Management of Patients With Thoracic Aortic Disease. Circulation. 2010; 121: A540-J811 Bicuspid aortic valve. No acute abnormality is noted. Aortic aneurysm NOS (ICD10-I71.9). Electronically Signed   By: Inez Catalina M.D.   On: 01/17/2018 15:20       Ct Angio Chest Aorta W  &/or Wo Contrast  Result Date: 01/04/2017 CLINICAL DATA:  Follow-up of ascending thoracic aortic aneurysm. EXAM: CT ANGIOGRAPHY CHEST WITH CONTRAST TECHNIQUE: Multidetector CT imaging of the chest was performed using the standard protocol during bolus administration of intravenous contrast. Multiplanar CT image reconstructions and MIPs were obtained to evaluate the vascular anatomy. CONTRAST:  75 mL of Isovue 370 COMPARISON:  January 06, 2016 FINDINGS: Cardiovascular: The thoracic aorta measures 4.3 cm in AP diameter at its root root on series 3, image 59 versus 4.3 cm previously. The thoracic aorta measures 4.5 cm at the level of the right main pulmonary artery, unchanged. The thoracic aorta measures 4.1 cm distally, as it enters the arch on series 3, image 36 versus 4.1 cm previously. The remainder of the thoracic aorta is normal in caliber. No dissection. The vessels off of the thoracic aortic arch are stable. Central pulmonary arteries are unremarkable. The heart size is normal and unchanged. No effusions. Mediastinum/Nodes: No enlarged mediastinal, hilar, or axillary lymph nodes. Thyroid gland, trachea, and esophagus demonstrate no significant findings. Lungs/Pleura: Lungs are clear. No pleural effusion or pneumothorax. 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 There is hepatic steatosis. The upper abdomen is otherwise unremarkable. Musculoskeletal: Degenerative changes seen in the thoracic spine. No other bony abnormalities. Review of the MIP images confirms the above findings. IMPRESSION: 1. Aneurysmal dilatation of the ascending thoracic aorta measuring up to 4.5 cm, unchanged in the interval. 2. 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 3. No other acute abnormalities. Ascending thoracic aortic aneurysm. Recommend semi-annual imaging followup by CTA or MRA and  referral to cardiothoracic surgery if not already obtained. This recommendation follows 2010 ACCF/AHA/AATS/ACR/ASA/SCA/SCAI/SIR/STS/SVM Guidelines for the Diagnosis and Management of Patients With Thoracic Aortic Disease. Circulation. 2010; 121: B147-W295 Electronically Signed   By: Dorise Bullion III M.D   On: 01/04/2017 14:00  Recent Lab Findings: Lab Results  Component Value Date  WBC 6.6 04/14/2019   HGB 14.5 04/14/2019   HCT 41.8 04/14/2019   PLT 201 04/14/2019   GLUCOSE 128 (H) 04/14/2019   CHOL 165 05/06/2019   TRIG 135 05/06/2019   HDL 63 05/06/2019   LDLDIRECT 164.4 12/25/2007   LDLCALC 75 05/06/2019   ALT 40 05/06/2019   AST 29 05/06/2019   NA 145 (H) 04/14/2019   K 5.0 04/14/2019   CL 107 (H) 04/14/2019   CREATININE 0.96 04/14/2019   BUN 18 04/14/2019   CO2 22 04/14/2019   HGBA1C 5.6 11/21/2018   ECHOCARDIOGRAM REPORT       Patient Name:   Jose [name] Date of Exam: 02/28/2019 Medical Rec #:  [id number]       Height:       72.0 in Accession #:    [id number]      Weight:       197.0 lb Date of Birth:  [birth year]       BSA:          2.12 m Patient Age:    65 years        BP:           126/99 mmHg Patient Gender: M               HR:           59 bpm. Exam Location:  [facility]    Procedure: 2D Echo, Cardiac Doppler and Color Doppler  Indications:    Q23.1 Bicuspid aortic valve   History:        Patient has prior history of Echocardiogram examinations, most                 recent 02/18/2018. Bicuspid aortic valve; Aortic stenosis.   Sonographer:    Coralyn Helling RDCS Referring Phys: [place]    1. The left ventricle has normal systolic function, with an ejection fraction of 55-60%. The cavity size was normal. There is mild concentric left ventricular hypertrophy. Left ventricular diastolic parameters were normal.  2. The aortic valveis bicuspid Mild thickening of the aortic valve Mild calcification of the aortic valve.  Aortic valve regurgitation was not assessed by color flow Doppler. moderate stenosis of the aortic valve. Mild aortic annular calcification noted.  3. Appears to have fusion of right and noncoronary cusps. Gradients similar to prior echo and support moderate stenosis. AVA in severe stenosis range; however LVOT measurements appear underestimated and there is not an ideal view for better measurement.  4. There is dilatation of the ascending aorta measuring 44 mm.  5. The right ventricle has normal systolic function. The cavity was normal. There is no increase in right ventricular wall thickness.  FINDINGS  Left Ventricle: The left ventricle has normal systolic function, with an ejection fraction of 55-60%. The cavity size was normal. There is mild concentric left ventricular hypertrophy. Left ventricular diastolic parameters were normal. Right Ventricle: The right ventricle has normal systolic function. The cavity was normal. There is no increase in right ventricular wall thickness. Left Atrium: left atrial size was normal in size Right Atrium: right atrial size was normal in size. Right atrial pressure is estimated at 3 mmHg. Interatrial Septum: No atrial level shunt detected by color flow Doppler. Pericardium: There is no evidence of pericardial effusion. Mitral Valve: The mitral valve is normal in structure. Mitral valve regurgitation is trivial by color flow Doppler. Tricuspid Valve: The tricuspid valve is normal in structure. Tricuspid  valve regurgitation is trivial by color flow Doppler. Aortic Valve: The aortic valveis bicuspid Mild thickening of the aortic valve Mild calcification of the aortic valve, with moderately decreased cusp excursion. Aortic valve regurgitation was not assessed by color flow Doppler. There is moderate stenosis  of the aortic valve, with a calculated valve area of 0.53 cm. Mild aortic annular calcification noted. Appears to have fusion of right and noncoronary cusps.  Gradients similar to prior echo and support moderate stenosis. AVA in severe stenosis range;  however LVOT measurements appear underestimated and there is not an ideal view for better measurement. Pulmonic Valve: The pulmonic valve was grossly normal. Pulmonic valve regurgitation is not visualized by color flow Doppler. No evidence of pulmonic stenosis. Aorta: There is dilatation of the ascending aorta measuring 44 mm. Pulmonary Artery: The pulmonary artery is of normal size and origin. Venous: The inferior vena cava measures 1.90 cm, is normal in size with greater than 50% respiratory variability.   LEFT VENTRICLE PLAX 2D LVIDd:         4.00 cm  Diastology LVIDs:         2.80 cm  LV e' lateral:   8.92 cm/s LV PW:         1.30 cm  LV E/e' lateral: 4.9 LV IVS:        1.30 cm  LV e' medial:    3.92 cm/s LVOT diam:     2.00 cm  LV E/e' medial:  11.1 LV SV:         40 ml LV SV Index:   18.87 LVOT Area:     3.14 cm  RIGHT VENTRICLE RV S prime:     6.64 cm/s TAPSE (M-mode): 1.1 cm RVSP:           24.0 mmHg  LEFT ATRIUM             Index       RIGHT ATRIUM           Index LA diam:        4.00 cm 1.89 cm/m  RA Pressure: 3 mmHg LA Vol (A2C):   46.5 ml 21.97 ml/m RA Area:     15.10 cm LA Vol (A4C):   51.2 ml 24.19 ml/m RA Volume:   35.20 ml  16.63 ml/m LA Biplane Vol: 52.2 ml 24.66 ml/m  AORTIC VALVE AV Area (Vmax):    0.60 cm AV Area (Vmean):   0.61 cm AV Area (VTI):     0.53 cm AV Vmax:           385.00 cm/s AV Vmean:          250.333 cm/s AV VTI:            0.865 m AV Peak Grad:      59.3 mmHg AV Mean Grad:      31.0 mmHg LVOT Vmax:         73.10 cm/s LVOT Vmean:        48.400 cm/s LVOT VTI:          0.147 m LVOT/AV VTI ratio: 0.17   AORTA Ao Root diam: 3.60 cm Ao Asc diam:  4.35 cm  MITRAL VALVE              TRICUSPID VALVE MV Area (PHT):            TR Peak grad:   21.0 mmHg MV PHT:  TR Vmax:        229.00 cm/s MV Decel Time: 494 msec   RVSP:            24.0 mmHg MV E velocity: 43.70 cm/s MV A velocity: 87.00 cm/s SHUNTS MV E/A ratio:  0.50       Systemic VTI:  0.15 m                           Systemic Diam: 2.00 cm  IVC IVC diam: 1.90 cm    Buford Dresser MD Electronically signed by Buford Dresser MD Signature Date/Time: 02/28/2019/1:20:21    TAssessment / Plan:   1/ Dilatation of the ascending aorta 4.5  without other stigmata of connective tissue disorder or family history of dilated aorta or dissection.  2/ echo documented bicuspid aortic valve with  moderate AS (mean gradient 30   mmHg). Stenosis Aortic- valve peak velocity, S  385   cm/s asymptomatic 3/hypertension    I've cautioned the patient about competitive or strenuous weightlifting involving Valsalva, and also the need for good blood pressure control  With abnormal valve the patient was cautioned about importance of continuing with good dental care   According to the 2010 ACC/AHA guidelines, we recommend patients with thoracic aortic disease to maintain a LDL of less than 70 and a HDL of greater than 50. We recommend their blood pressure to remain less than 135/85.   Discussed with patient follow-up , he is to see cardiology in July.  We will leave timing of follow-up CTA or cardiac CT of the chest to Dr. Frances Nickels and plan to see the patient back after the next scan is done perhaps in 8 to 12 months.  I suspect the degree of aortic stenosis he has will dictate the time course of intervention.  The patient did have questions about TAVR valves on his visit today.  Grace Isaac MD      [name],[place] [address] Office [phone]   Beeper [phone]  05/29/2019 2:59 PM

## 2019-06-24 NOTE — Progress Notes (Signed)
Virtual Visit via Video Note   This visit type was conducted due to national recommendations for restrictions regarding the COVID-19 Pandemic (e.g. social distancing) in an effort to limit this patient's exposure and mitigate transmission in our community.  Due to his co-morbid illnesses, this patient is at least at moderate risk for complications without adequate follow up.  This format is felt to be most appropriate for this patient at this time.  All issues noted in this document were discussed and addressed.  A limited physical exam was performed with this format.  Please refer to the patient's chart for his consent to telehealth for [facility].   Date:  06/30/2019   ID:  Jose [name], DOB 07-12-1951, MRN [id number]  Patient Location: Home Provider Location: Office  PCP:  [name], MD  Cardiologist:   Johnsie Cancel Electrophysiologist:  None   Evaluation Performed:  Follow-Up Visit  Chief Complaint:  Bicuspid AV/Aortic aneurysm   History of Present Illness:    Jose [name] a 68 y.o.malewho presents for f/u vascular disease and bicuspid AV with moderate AS Followed by [name] for ascending aortic dilatations. Reviewed CTA 96/2/20and measured stable 4.3 cm No CAD and calcium score of only 3 which was 18% for age and sex matched controls The patient has a history of intracranial bleed/ subdural hematoma without history of trauma treated in [place]. He underwent right craniotomy. 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 There is no family history of aortic aneurysms, unexplained sudden death at an early age or aortic dissection.He has one cousin who died in his 6s of sudden collapse thought to be a brain aneurysm  TTE done 02/18/18 with moderate AS  some progression of gradients with mean 30 peak 58 mmHg  TTE done 02/28/19 stable moderate AS mean gradient 31 peak 59 mmHG  Had a nonischemic myovue 02/21/17 EF estimated 46% but low normal on echo . 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 Surgery this [place]  Walking and riding bike   The patient  does not have symptoms concerning for COVID-19 infection (fever, chills, cough, or new shortness of breath).    Past Medical History:  Diagnosis Date  . Arthritis    R shoulder, great toes, hands- has gout & has injections in knees with Dr. Estanislado Pandy   . Cancer ([place])    skin ca-   . GERD (gastroesophageal reflux disease)   . History of hiatal hernia   . SDH (subdural hematoma) (HCC)    Past Surgical History:  Procedure Laterality Date  . BRAIN SURGERY  2016   evacuation of SDH  . CARPAL TUNNEL RELEASE Bilateral   . GREEN LIGHT LASER TURP (TRANSURETHRAL RESECTION OF PROSTATE  04/27/2017  . KNEE ARTHROPLASTY    . QUADRICEPS TENDON REPAIR Bilateral 06/15/2017   Procedure: REPAIR QUADRICEP TENDON;  Surgeon: Marcene Duos  Nicki Reaper, MD;  Location: [facility];  Service: Orthopedics;  Laterality: Bilateral;  . SHOULDER SURGERY Right      Current Meds  Medication Sig  . acetaminophen (TYLENOL) 500 MG tablet Take 500-1,000 mg by mouth every 6 (six) hours as needed for mild pain.  Marland Kitchen allopurinol (ZYLOPRIM) 300 MG tablet TAKE 1 TABLET BY MOUTH EVERY DAY  . atorvastatin (LIPITOR) 10 MG tablet Take 1 tablet (10 mg total) by mouth daily at 6 PM.  . COLESTIPOL HCL PO Take 5 mg by mouth daily.   Marland Kitchen esomeprazole  (NEXIUM) 40 MG capsule Take 1 capsule (40 mg total) by mouth daily.  . Multiple Vitamin (MULTIVITAMIN WITH MINERALS) TABS tablet Take 1 tablet by mouth daily.  Marland Kitchen UNABLE TO FIND 1 tablet as needed. Med Name: Col-quit     Allergies:   Ciprofloxacin and Quinolones   Social History   Tobacco Use  . Smoking status: Never Smoker  . Smokeless tobacco: Never Used  Substance Use Topics  . Alcohol use: Yes    Alcohol/week: 8.0 standard drinks    Types: 7 Glasses of wine, 1 Shots of liquor per week  . Drug use: No     Family Hx: The patient's family history includes Diabetes in his brother; Heart disease in his father; Parkinson's disease in his brother and mother.  ROS:   Please see the history of present illness.     All other systems reviewed and are negative.   Prior CV studies:   The following studies were reviewed today:  Echo March 2020 Cardiac CTA June 2020  Labs/Other Tests and Data Reviewed:    EKG:  Not performed this visit   Recent Labs: 04/14/2019: BUN 18; Creatinine, Ser 0.96; Hemoglobin 14.5; Platelets 201; Potassium 5.0; Sodium 145 05/06/2019: ALT 40   Recent Lipid Panel Lab Results  Component Value Date/Time   CHOL 165 05/06/2019 09:26 AM   TRIG 135 05/06/2019 09:26 AM   HDL 63 05/06/2019 09:26 AM   CHOLHDL 2.6 05/06/2019 09:26 AM   CHOLHDL 3.7 CALC 12/25/2007 04:36 PM   LDLCALC 75 05/06/2019 09:26 AM   LDLDIRECT 164.4 12/25/2007 04:36 PM    Wt Readings from Last 3 Encounters:  06/30/19 194 lb (88 kg)  05/29/19 203 lb (92.1 kg)  03/28/19 192 lb (87.1 kg)     Objective:    Vital Signs:  Ht 6' (1.829 m)   Wt 194 lb (88 kg)   BMI 26.31 kg/m    Skin warm and dry No distress No tachypnea No JVP elevation  Neuro appears non focal No edema   ASSESSMENT & PLAN:    Bicuspid AV: with mean gradient of 30 mmHG and peak 59 mmHg fu echo September 2020 [facility] care Aneurysm: followed by [name] 4.3 cm on cardiac CT June 2020 HTN:  Well  controlled.  Continue current medications and low sodium Dash type diet.   HLD:  Continue statin target LDL less than 70 with aneurysm f/u labs November  Gout: continue allopurinol no attacks  Pre Op:  Clear to have quad surgery this week in [place] Education: The signs and symptoms of COVID-19 were discussed with the patient and how to seek care for testing (follow up with PCP or arrange E-visit).  The importance of social distancing was discussed today.  Time:   Today, I have spent 30 minutes with the patient with telehealth technology discussing the above problems.     Medication Adjustments/Labs and Tests  Ordered: Current medicines are reviewed at length with the patient today.  Concerns regarding medicines are outlined above.   Tests Ordered:  Echo September 2020 AS Lipids in November   Medication Changes:  None  Disposition:  Follow up in 6 months with echo for AS   Signed, [name], MD  06/30/2019 8:01 AM    [place]

## 2019-06-27 ENCOUNTER — Encounter: Payer: Self-pay | Admitting: Cardiovascular Disease

## 2019-06-30 ENCOUNTER — Telehealth (INDEPENDENT_AMBULATORY_CARE_PROVIDER_SITE_OTHER): Payer: Medicare Other | Admitting: Cardiovascular Disease

## 2019-06-30 ENCOUNTER — Other Ambulatory Visit: Payer: Self-pay

## 2019-06-30 ENCOUNTER — Encounter: Payer: Self-pay | Admitting: Cardiovascular Disease

## 2019-06-30 VITALS — Ht 72.0 in | Wt 194.0 lb

## 2019-06-30 DIAGNOSIS — E785 Hyperlipidemia, unspecified: Secondary | ICD-10-CM

## 2019-06-30 DIAGNOSIS — I350 Nonrheumatic aortic (valve) stenosis: Secondary | ICD-10-CM

## 2019-06-30 NOTE — Patient Instructions (Signed)
Your physician recommends that you continue on your current medications as directed. Please refer to the Current Medication list given to you today.  Your physician has requested that you have an echocardiogram. Echocardiography is a painless test that uses sound waves to create images of your heart. It provides your doctor with information about the size and shape of your heart and how well your heart's chambers and valves are working. This procedure takes approximately one hour. There are no restrictions for this procedure. DUE IN SEPT SEE DR [place]  Your physician recommends that you return for lab work in:  [place] [place]  Your physician recommends that you schedule a follow-up appointment in: [place]

## 2019-07-02 ENCOUNTER — Telehealth: Payer: Medicare Other | Admitting: Cardiovascular Disease

## 2019-07-17 ENCOUNTER — Other Ambulatory Visit: Payer: Self-pay | Admitting: Rheumatology

## 2019-07-17 NOTE — Telephone Encounter (Signed)
Last Visit; 04/15/19 Next Visit: 10/06/19 Labs: 04/14/19 Glucose 128, Sodium 145, Chloride 107  Okay to refill per Dr. Estanislado Pandy

## 2019-08-19 ENCOUNTER — Other Ambulatory Visit (HOSPITAL_COMMUNITY): Payer: Medicare Other

## 2019-08-27 NOTE — Progress Notes (Deleted)
Date:  08/27/2019   ID:  Elayne Snare Schuenemann, DOB [birth year], MRN [id number]  Provider Location: Office  PCP:  [name], MD  Cardiologist:   Johnsie Cancel Electrophysiologist:  None   Evaluation Performed:  Follow-Up Visit  Chief Complaint:  Bicuspid AV/Aortic aneurysm   History of Present Illness:    Aaronjames A Coreminis a 68 y.o.malewho presents for f/u vascular disease and bicuspid AV with moderate AS Followed by [name] for ascending aortic dilatations. Reviewed CTA 6/2/20and measured stable 4.3 cm No CAD and calcium score of only 3 which was 18% for age and sex matched controls The patient has a history of intracranial bleed/ subdural hematoma without history of trauma treated in [place]. He underwent right craniotomy. 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 There is no family history of aortic aneurysms, unexplained sudden death at an early age or aortic dissection.He has one cousin who died in his 78s of sudden collapse thought to be a brain aneurysm  TTE done 02/18/18 with moderate AS some progression of gradients with mean 30 peak 58 mmHg  TTE done 02/28/19 stable moderate AS mean gradient 31 peak 59 mmHG  Had a nonischemic myovue 02/21/17 EF estimated 46% but low normal on echo . 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    Past Medical History:  Diagnosis Date  . Arthritis    R shoulder, great toes, hands- has gout & has injections in knees with Dr. Estanislado Pandy   . Cancer ([place])    skin ca-   . GERD (gastroesophageal reflux disease)   . History of hiatal hernia   . SDH (subdural hematoma) (HCC)    Past Surgical History:  Procedure Laterality Date  . BRAIN SURGERY  2016   evacuation of SDH  . CARPAL TUNNEL RELEASE Bilateral   . GREEN LIGHT LASER TURP (TRANSURETHRAL RESECTION OF PROSTATE  04/27/2017  . KNEE ARTHROPLASTY    . QUADRICEPS TENDON REPAIR Bilateral 06/15/2017   Procedure: REPAIR QUADRICEP TENDON;  Surgeon: Meredith Pel, MD;  Location: [facility];  Service: Orthopedics;  Laterality: Bilateral;  . SHOULDER SURGERY Right      No outpatient medications have been marked as taking for the 09/04/19 encounter (Appointment) with Josue Hector, MD.     Allergies:   Ciprofloxacin and Quinolones   Social History   Tobacco Use  . Smoking status: Never Smoker  . Smokeless tobacco: Never Used  Substance Use Topics  . Alcohol use: Yes    Alcohol/week: 8.0 standard drinks    Types: 7 Glasses of wine, 1 Shots of liquor per week  . Drug use: No     Family Hx: The patient's family history includes Diabetes  in his brother; Heart disease in his father; Parkinson's disease in his brother and mother.  ROS:   Please see the history of present illness.     All other systems reviewed and are negative.   Prior CV studies:   The following studies were reviewed today:  Echo March 2020 Cardiac CTA June 2020  Labs/Other Tests and Data Reviewed:    EKG:  Not performed this visit   Recent Labs: 04/14/2019: BUN 18; Creatinine, Ser 0.96; Hemoglobin 14.5; Platelets 201; Potassium 5.0; Sodium 145 05/06/2019: ALT 40   Recent Lipid Panel Lab Results  Component Value Date/Time   CHOL 165  05/06/2019 09:26 AM   TRIG 135 05/06/2019 09:26 AM   HDL 63 05/06/2019 09:26 AM   CHOLHDL 2.6 05/06/2019 09:26 AM   CHOLHDL 3.7 CALC 12/25/2007 04:36 PM   LDLCALC 75 05/06/2019 09:26 AM   LDLDIRECT 164.4 12/25/2007 04:36 PM    Wt Readings from Last 3 Encounters:  06/30/19 194 lb (88 kg)  05/29/19 203 lb (92.1 kg)  03/28/19 192 lb (87.1 kg)     Objective:    Vital Signs:  There were no vitals taken for this visit.   Affect appropriate Healthy:  appears stated age 31: normal Neck supple with no adenopathy JVP normal no bruits no thyromegaly Lungs clear with no wheezing and good diaphragmatic motion Heart:  S1/S2 AS murmur, no rub, gallop or click PMI normal Abdomen: benighn, BS positve, no tenderness, no AAA no bruit.  No HSM or HJR Distal pulses intact with no bruits No edema Neuro non-focal Skin warm and dry No muscular weakness    ASSESSMENT & PLAN:    Bicuspid AV: with mean gradient of 30 mmHG and peak 59 mmHg fu echo *** Aneurysm: followed by [name] 4.3 cm on cardiac CT June 2020 HTN:  Well controlled.  Continue current medications and low sodium Dash type diet.   HLD:  Continue statin target LDL less than 70 with aneurysm f/u labs November  Gout: continue allopurinol no attacks  Ortho:  Post quad surgery July [place] improved   COVID-19 Education: The signs and symptoms of COVID-19 were discussed with the patient and how to seek care for testing (follow up with PCP or arrange E-visit).  The importance of social distancing was discussed today.  Time:   Today, I have spent 30 minutes with the patient       Medication Adjustments/Labs and Tests Ordered: Current medicines are reviewed at length with the patient today.  Concerns regarding medicines are outlined above.   Tests Ordered:  Echo September 2020 AS Lipids in November   Medication Changes:  None  Disposition:  Follow up in a year   Signed, [name], MD  08/27/2019 10:36 AM     [place]

## 2019-09-04 ENCOUNTER — Ambulatory Visit: Payer: Medicare Other | Admitting: Cardiovascular Disease

## 2019-09-04 ENCOUNTER — Other Ambulatory Visit (HOSPITAL_COMMUNITY): Payer: Medicare Other

## 2019-09-22 NOTE — Progress Notes (Deleted)
Office Visit Note  Patient: Jose [name]             Date of Birth: 29-Apr-1951           MRN: [id number]             PCP: [name], MD Referring: [name], MD Visit Date: 10/06/2019 Occupation: @GUAROCC @  Subjective:  No chief complaint on file.   History of Present Illness: Jose [name] is a 68 y.o. male ***   Activities of Daily Living:  Patient reports morning stiffness for *** {minute/hour:19697}.   Patient {ACTIONS;DENIES/REPORTS:21021675::"Denies"} nocturnal pain.  Difficulty dressing/grooming: {ACTIONS;DENIES/REPORTS:21021675::"Denies"} Difficulty climbing stairs: {ACTIONS;DENIES/REPORTS:21021675::"Denies"} Difficulty getting out of chair: {ACTIONS;DENIES/REPORTS:21021675::"Denies"} Difficulty using hands for taps, buttons, cutlery, and/or writing: {ACTIONS;DENIES/REPORTS:21021675::"Denies"}  No Rheumatology ROS completed.   PMFS History:  Patient Active Problem List   Diagnosis Date Noted  . Candidiasis   . Abnormality of gait   . Hypoalbuminemia due to protein-calorie malnutrition ([place])   . History of gout   . Sleep disturbance   . Constipation   . Rupture of quadriceps tendon, right, sequela 06/19/2017  . Quadriceps tendon rupture, left, sequela 06/19/2017  . Acute blood loss anemia 06/19/2017  . Fall   . History of subdural hematoma   . Post-operative pain   . Recurrent falls   . Quadriceps tendon rupture 06/15/2017  . Primary osteoarthritis of both knees 02/28/2017  . Primary osteoarthritis of both hands 02/28/2017  . Uricacidemia 02/28/2017  . Pain in joint of left knee 02/07/2017  . Idiopathic chronic gout, unspecified site, without tophus (tophi) 11/29/2016  . HEMATURIA UNSPECIFIED 01/25/2009  . ABDOMINAL PAIN 01/25/2009  . XERODERMA 03/10/2008  . UNS ADVRS EFF UNS RX MEDICINAL&BIOLOGICAL SBSTNC 03/10/2008  . ADJUSTMENT REACTION WITH PHYSICAL SYMPTOMS 02/14/2008  . MUSCLE SPASM 02/14/2008  . ACUTE PROSTATITIS 12/16/2007  .  BREAST LUMP OR MASS, RIGHT 07/12/2007  . H/O: RCT (rotator cuff tear) 01/13/2007  . GOUT 01/08/2007    Past Medical History:  Diagnosis Date  . Arthritis    R shoulder, great toes, hands- has gout & has injections in knees with Dr. Estanislado Pandy   . Cancer ([place])    skin ca-   . GERD (gastroesophageal reflux disease)   . History of hiatal hernia   . SDH (subdural hematoma) (HCC)     Family History  Problem Relation Age of Onset  . Parkinson's disease Mother   . Heart disease Father   . Parkinson's disease Brother   . Diabetes Brother    Past Surgical History:  Procedure Laterality Date  . BRAIN SURGERY  2016   evacuation of SDH  . CARPAL TUNNEL RELEASE Bilateral   . GREEN LIGHT LASER TURP (TRANSURETHRAL RESECTION OF PROSTATE  04/27/2017  . KNEE ARTHROPLASTY    . QUADRICEPS TENDON REPAIR Bilateral 06/15/2017   Procedure: REPAIR QUADRICEP TENDON;  Surgeon: Meredith Pel, MD;  Location: [facility];  Service: Orthopedics;  Laterality: Bilateral;  . SHOULDER SURGERY Right    Social History   Social History Narrative  . Not on file    There is no immunization history on file for this patient.   Objective: Vital Signs: There were no vitals taken for this visit.   Physical Exam   Musculoskeletal Exam: ***  CDAI Exam: CDAI Score: - Patient Global: -; Provider Global: - Swollen: -; Tender: - Joint Exam   No joint exam has been documented for this visit   There is currently no information documented on the  homunculus. Go to the Rheumatology activity and complete the homunculus joint exam.  Investigation: No additional findings.  Imaging: No results found.  Recent Labs: Lab Results  Component Value Date   WBC 6.6 04/14/2019   HGB 14.5 04/14/2019   PLT 201 04/14/2019   NA 145 (H) 04/14/2019   K 5.0 04/14/2019   CL 107 (H) 04/14/2019   CO2 22 04/14/2019   GLUCOSE 128 (H) 04/14/2019   BUN 18 04/14/2019   CREATININE 0.96 04/14/2019   BILITOT 0.6 05/06/2019    ALKPHOS 74 05/06/2019   AST 29 05/06/2019   ALT 40 05/06/2019   PROT 6.2 05/06/2019   ALBUMIN 4.1 05/06/2019   CALCIUM 9.8 04/14/2019   GFRAA 93 04/14/2019    Speciality Comments: No specialty comments available.  Procedures:  No procedures performed Allergies: Ciprofloxacin and Quinolones   Assessment / Plan:     Visit Diagnoses: No diagnosis found.  Orders: No orders of the defined types were placed in this encounter.  No orders of the defined types were placed in this encounter.   Face-to-face time spent with patient was *** minutes. Greater than 50% of time was spent in counseling and coordination of care.  Follow-Up Instructions: No follow-ups on file.   Earnestine Mealing, CMA  Note - This record has been created using Editor, commissioning.  Chart creation errors have been sought, but may not always  have been located. Such creation errors do not reflect on  the standard of medical care.

## 2019-09-24 NOTE — H&P (View-Only) (Signed)
Date:  09/26/2019   ID:  Elayne Snare Kackley, DOB 04/14/1951, MRN [id number]  Provider Location: Office  PCP:  [name], MD  Cardiologist:   Johnsie Cancel Electrophysiologist:  None   Evaluation Performed:  Follow-Up Visit  Chief Complaint:  Bicuspid AV/Aortic aneurysm   History of Present Illness:    Brittany A Coreminis a 68 y.o.malewho presents for f/u vascular disease and bicuspid AV with moderate AS Followed by [name] for ascending aortic dilatations. Reviewed CTA 6/2/20and measured stable 4.3 cm No CAD and calcium score of only 3 which was 18% for age and sex matched controls The patient has a history of intracranial bleed/ subdural hematoma without history of trauma treated in [place]. He underwent right craniotomy. 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 There is no family history of aortic aneurysms, unexplained sudden death at an early age or aortic dissection.He has one cousin who died in his 9s of sudden collapse thought to be a brain aneurysm  TTE done 02/18/18 with moderate AS some progression of gradients with mean 30 peak 58 mmHg  TTE done 02/28/19 stable moderate AS mean gradient 31 peak 59 mmHG  Had a nonischemic myovue 02/21/17 EF estimated 46% but low normal on echo . 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 Will get right and left cath and cardiac CTA to assess for TAVR. If his aorta has grown over 4.4 cm or has significant CAD Will need open surgery likely with [name] who has followed him for years  Risks of cath including stroke , MI, CABG infections and contrast reaction discussed willing to proceed   The patient  does not have symptoms concerning for COVID-19 infection (fever, chills, cough, or new shortness of breath).    Past Medical History:  Diagnosis Date   Arthritis    R shoulder, great toes, hands- has gout & has injections in knees with Dr. Estanislado Pandy    Cancer [facility])    skin ca-    GERD (gastroesophageal reflux disease)    History of hiatal hernia    SDH (subdural hematoma) ([place])    Past Surgical History:  Procedure Laterality Date   BRAIN SURGERY  2016   evacuation of SDH   CARPAL TUNNEL RELEASE Bilateral    GREEN LIGHT LASER TURP (TRANSURETHRAL RESECTION OF PROSTATE  04/27/2017   KNEE ARTHROPLASTY     QUADRICEPS TENDON REPAIR Bilateral 06/15/2017   Procedure: REPAIR QUADRICEP TENDON;  Surgeon: Meredith Pel, MD;  Location: [facility];  Service: Orthopedics;  Laterality: Bilateral;   SHOULDER SURGERY Right      Current Meds  Medication Sig  acetaminophen (TYLENOL) 500 MG tablet Take 500-1,000 mg by mouth every 6 (six) hours as needed for mild pain.   allopurinol (ZYLOPRIM) 300 MG tablet TAKE 1 TABLET BY MOUTH EVERY DAY   atorvastatin (LIPITOR) 10 MG tablet Take 1 tablet (10 mg total) by mouth daily at 6 PM.   COLESTIPOL HCL PO Take 5 mg by mouth daily.    esomeprazole (NEXIUM) 40 MG capsule Take 1 capsule (40 mg total) by mouth daily.   meloxicam (MOBIC) 7.5 MG tablet Take 15 mg by mouth daily as needed for pain.   metoprolol succinate (TOPROL-XL) 25 MG 24 hr tablet Take 1 tablet (25 mg  total) by mouth daily. Take with or immediately following a meal.   Multiple Vitamin (MULTIVITAMIN WITH MINERALS) TABS tablet Take 1 tablet by mouth daily.   oxyCODONE (OXY IR/ROXICODONE) 5 MG immediate release tablet Take 1 tablet by mouth daily as needed.   pregabalin (LYRICA) 75 MG capsule Take 75 mg by mouth 2 (two) times daily as needed.   traMADol (ULTRAM) 50 MG tablet Take 50 mg by mouth daily as needed.   UNABLE TO FIND 1 tablet as needed. Med Name: Col-quit     Allergies:   Ciprofloxacin and Quinolones   Social History   Tobacco Use   Smoking status: Never Smoker   Smokeless tobacco: Never Used  Substance Use Topics   Alcohol use: Yes    Alcohol/week: 8.0 standard drinks    Types: 7 Glasses of wine, 1 Shots of liquor per week   Drug use: No     Family Hx: The patient's family history includes Diabetes in his brother; Heart disease in his father; Parkinson's disease in his brother and mother.  ROS:   Please see the history of present illness.     All other systems reviewed and are negative.   Prior CV studies:   The following studies were reviewed today:  Echo March 2020 Cardiac CTA June 2020  Labs/Other Tests and Data Reviewed:    EKG:  Not performed this visit   Recent Labs: 04/14/2019: BUN 18; Creatinine, Ser 0.96; Hemoglobin 14.5; Platelets 201; Potassium 5.0; Sodium 145 05/06/2019: ALT 40   Recent Lipid Panel Lab Results  Component Value Date/Time   CHOL 165 05/06/2019 09:26 AM   TRIG 135 05/06/2019 09:26 AM   HDL 63 05/06/2019 09:26 AM   CHOLHDL 2.6 05/06/2019 09:26 AM   CHOLHDL 3.7 CALC 12/25/2007 04:36 PM   LDLCALC 75 05/06/2019 09:26 AM   LDLDIRECT 164.4 12/25/2007 04:36 PM    Wt Readings from Last 3 Encounters:  09/26/19 206 lb (93.4 kg)  06/30/19 194 lb (88 kg)  05/29/19 203 lb (92.1 kg)     Objective:    Vital Signs:  BP 120/82 (BP Location: Left Arm, Patient Position: Sitting, Cuff Size: Normal)    Pulse 63    Resp 20    Ht  6' (1.829 m)    Wt 206 lb (93.4 kg)    BMI 27.94 kg/m    Affect appropriate Healthy:  appears stated age HEENT: normal Neck supple with no adenopathy JVP normal no bruits no thyromegaly Lungs clear with no wheezing and good diaphragmatic motion Heart:  S1/S2 AS murmur, no rub, gallop or click PMI normal Abdomen: benighn, BS positve, no tenderness, no AAA no bruit.  No HSM or HJR Distal pulses intact with no bruits No edema Neuro non-focal Skin warm and dry No muscular weakness    ASSESSMENT & PLAN:  Bicuspid AV: with mean gradient of 30 mmHG and peak 59 mmHg fu echo today showed progression with mean gradient 46 mmHg right  And left cath scheduled for 10/14 with Dr Julianne Handler Orders written lab called  Aneurysm: followed by [name] 4.3 cm on cardiac CT June 2020 will have TAVR CTA after cath to assess root and sizing of possible stent Valve if aneurysm not grown and no significant CAD HTN:  Well controlled.  Continue current medications and low sodium Dash type diet.   HLD:  Continue statin target LDL less than 70 with aneurysm f/u labs November  Gout: continue allopurinol no attacks  Ortho:  Post quad surgery July [place] improved   COVID-19 Education: The signs and symptoms of COVID-19 were discussed with the patient and how to seek care for testing (follow up with PCP or arrange E-visit).  The importance of social distancing was discussed today.  Time:   Today, I have spent 30 minutes with the patient       Medication Adjustments/Labs and Tests Ordered: Current medicines are reviewed at length with the patient today.  Concerns regarding medicines are outlined above.   Tests Ordered:  Right and left cath TAVR CT   Medication Changes:  None  Disposition:  Follow up after testing Refer to [facility] surgical opinion    Signed, [name], MD  09/26/2019 10:45 AM    [place]

## 2019-09-24 NOTE — Progress Notes (Signed)
Date:  09/26/2019   ID:  Jose [name], DOB Dec 09, 1951, MRN [id number]  Provider Location: Office  PCP:  Jose Spurling, [name]  Cardiologist:   Jose [name] Electrophysiologist:  None   Evaluation Performed:  Follow-Up Visit  Chief Complaint:  Bicuspid AV/Aortic aneurysm   History of Present Illness:    Jose [name] a 68 y.o.malewho presents for f/u vascular disease and bicuspid AV with moderate AS Followed by Jose [name] for ascending aortic dilatations. Reviewed CTA 6/2/20and measured stable 4.3 cm No CAD and calcium score of only 3 which was 18% for age and sex matched controls The patient has a history of intracranial bleed/ subdural hematoma without history of trauma treated in [place]. He underwent right craniotomy. 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 There is no family history of aortic aneurysms, unexplained sudden death at an early age or aortic dissection.He has one cousin who died in his 62s of sudden collapse thought to be a brain aneurysm  TTE done 02/18/18 with moderate AS some progression of gradients with mean 30 peak 58 mmHg  TTE done 02/28/19 stable moderate AS mean gradient 31 peak 59 mmHG  Had a nonischemic myovue 02/21/17 EF estimated 46% but low normal on echo . 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 Will get right and left cath and cardiac CTA to assess for TAVR. If his aorta has grown over 4.4 cm or has significant CAD Will need open surgery likely with Jose [name] who has followed him for years  Risks of cath including stroke , MI, CABG infections and contrast reaction discussed willing to proceed   The patient  does not have symptoms concerning for COVID-19 infection (fever, chills, cough, or new shortness of breath).    Past Medical History:  Diagnosis Date   Arthritis    R shoulder, great toes, hands- has gout & has injections in knees with Jose. Estanislado [name]    Cancer [facility])    skin ca-    GERD (gastroesophageal reflux disease)    History of hiatal hernia    SDH (subdural hematoma) ([place])    Past Surgical History:  Procedure Laterality Date   BRAIN SURGERY  2016   evacuation of SDH   CARPAL TUNNEL RELEASE Bilateral    GREEN LIGHT LASER TURP (TRANSURETHRAL RESECTION OF PROSTATE  04/27/2017   KNEE ARTHROPLASTY     QUADRICEPS TENDON REPAIR Bilateral 06/15/2017   Procedure: REPAIR QUADRICEP TENDON;  Surgeon: Jose [name];  Location: [facility];  Service: Orthopedics;  Laterality: Bilateral;   SHOULDER SURGERY Right      Current Meds  Medication Sig  acetaminophen (TYLENOL) 500 MG tablet Take 500-1,000 mg by mouth every 6 (six) hours as needed for mild pain.   allopurinol (ZYLOPRIM) 300 MG tablet TAKE 1 TABLET BY MOUTH EVERY DAY   atorvastatin (LIPITOR) 10 MG tablet Take 1 tablet (10 mg total) by mouth daily at 6 PM.   COLESTIPOL HCL PO Take 5 mg by mouth daily.    esomeprazole (NEXIUM) 40 MG capsule Take 1 capsule (40 mg total) by mouth daily.   meloxicam (MOBIC) 7.5 MG tablet Take 15 mg by mouth daily as needed for pain.   metoprolol succinate (TOPROL-XL) 25 MG 24 hr tablet Take 1 tablet (25 mg  total) by mouth daily. Take with or immediately following a meal.   Multiple Vitamin (MULTIVITAMIN WITH MINERALS) TABS tablet Take 1 tablet by mouth daily.   oxyCODONE (OXY IR/ROXICODONE) 5 MG immediate release tablet Take 1 tablet by mouth daily as needed.   pregabalin (LYRICA) 75 MG capsule Take 75 mg by mouth 2 (two) times daily as needed.   traMADol (ULTRAM) 50 MG tablet Take 50 mg by mouth daily as needed.   UNABLE TO FIND 1 tablet as needed. Med Name: Col-quit     Allergies:   Ciprofloxacin and Quinolones   Social History   Tobacco Use   Smoking status: Never Smoker   Smokeless tobacco: Never Used  Substance Use Topics   Alcohol use: Yes    Alcohol/week: 8.0 standard drinks    Types: 7 Glasses of wine, 1 Shots of liquor per week   Drug use: No     Family Hx: The patient's family history includes Diabetes in his brother; Heart disease in his father; Parkinson's disease in his brother and mother.  ROS:   Please see the history of present illness.     All other systems reviewed and are negative.   Prior CV studies:   The following studies were reviewed today:  Echo March 2020 Cardiac CTA June 2020  Labs/Other Tests and Data Reviewed:    EKG:  Not performed this visit   Recent Labs: 04/14/2019: BUN 18; Creatinine, Ser 0.96; Hemoglobin 14.5; Platelets 201; Potassium 5.0; Sodium 145 05/06/2019: ALT 40   Recent Lipid Panel Lab Results  Component Value Date/Time   CHOL 165 05/06/2019 09:26 AM   TRIG 135 05/06/2019 09:26 AM   HDL 63 05/06/2019 09:26 AM   CHOLHDL 2.6 05/06/2019 09:26 AM   CHOLHDL 3.7 CALC 12/25/2007 04:36 PM   LDLCALC 75 05/06/2019 09:26 AM   LDLDIRECT 164.4 12/25/2007 04:36 PM    Wt Readings from Last 3 Encounters:  09/26/19 206 lb (93.4 kg)  06/30/19 194 lb (88 kg)  05/29/19 203 lb (92.1 kg)     Objective:    Vital Signs:  BP 120/82 (BP Location: Left Arm, Patient Position: Sitting, Cuff Size: Normal)    Pulse 63    Resp 20    Ht  6' (1.829 m)    Wt 206 lb (93.4 kg)    BMI 27.94 kg/m    Affect appropriate Healthy:  appears stated age HEENT: normal Neck supple with no adenopathy JVP normal no bruits no thyromegaly Lungs clear with no wheezing and good diaphragmatic motion Heart:  S1/S2 AS murmur, no rub, gallop or click PMI normal Abdomen: benighn, BS positve, no tenderness, no AAA no bruit.  No HSM or HJR Distal pulses intact with no bruits No edema Neuro non-focal Skin warm and dry No muscular weakness    ASSESSMENT & PLAN:  Bicuspid AV: with mean gradient of 30 mmHG and peak 59 mmHg fu echo today showed progression with mean gradient 46 mmHg right  And left cath scheduled for 10/14 with Jose Julianne Handler Orders written lab called  Aneurysm: followed by Jose [name] 4.3 cm on cardiac CT June 2020 will have TAVR CTA after cath to assess root and sizing of possible stent Valve if aneurysm not grown and no significant CAD HTN:  Well controlled.  Continue current medications and low sodium Dash type diet.   HLD:  Continue statin target LDL less than 70 with aneurysm f/u labs November  Gout: continue allopurinol no attacks  Ortho:  Post quad surgery July [place] improved   COVID-19 Education: The signs and symptoms of COVID-19 were discussed with the patient and how to seek care for testing (follow up with PCP or arrange E-visit).  The importance of social distancing was discussed today.  Time:   Today, I have spent 30 minutes with the patient       Medication Adjustments/Labs and Tests Ordered: Current medicines are reviewed at length with the patient today.  Concerns regarding medicines are outlined above.   Tests Ordered:  Right and left cath TAVR CT   Medication Changes:  None  Disposition:  Follow up after testing Refer to [facility] surgical opinion    Signed, [name], [name]  09/26/2019 10:45 AM    [place]

## 2019-09-25 ENCOUNTER — Other Ambulatory Visit: Payer: Self-pay | Admitting: Cardiovascular Disease

## 2019-09-26 ENCOUNTER — Other Ambulatory Visit: Payer: Self-pay

## 2019-09-26 ENCOUNTER — Ambulatory Visit (HOSPITAL_COMMUNITY): Payer: Medicare Other | Attending: Cardiology

## 2019-09-26 ENCOUNTER — Ambulatory Visit (INDEPENDENT_AMBULATORY_CARE_PROVIDER_SITE_OTHER): Payer: Medicare Other | Admitting: Cardiovascular Disease

## 2019-09-26 ENCOUNTER — Encounter: Payer: Self-pay | Admitting: Cardiovascular Disease

## 2019-09-26 VITALS — BP 120/82 | HR 63 | Resp 20 | Ht 72.0 in | Wt 206.0 lb

## 2019-09-26 DIAGNOSIS — I350 Nonrheumatic aortic (valve) stenosis: Secondary | ICD-10-CM | POA: Diagnosis present

## 2019-09-26 LAB — CBC
Hematocrit: 39.1 % (ref 37.5–51.0)
Hemoglobin: 13.2 g/dL (ref 13.0–17.7)
MCH: 30.6 pg (ref 26.6–33.0)
MCHC: 33.8 g/dL (ref 31.5–35.7)
MCV: 91 fL (ref 79–97)
Platelets: 207 10*3/uL (ref 150–450)
RBC: 4.31 x10E6/uL (ref 4.14–5.80)
RDW: 12.9 % (ref 11.6–15.4)
WBC: 6.3 10*3/uL (ref 3.4–10.8)

## 2019-09-26 LAB — BASIC METABOLIC PANEL
BUN/Creatinine Ratio: 21 (ref 10–24)
BUN: 22 mg/dL (ref 8–27)
CO2: 24 mmol/L (ref 20–29)
Calcium: 9.4 mg/dL (ref 8.6–10.2)
Chloride: 103 mmol/L (ref 96–106)
Creatinine, Ser: 1.04 mg/dL (ref 0.76–1.27)
GFR calc Af Amer: 84 mL/min/{1.73_m2} (ref >59)
GFR calc non Af Amer: 72 mL/min/{1.73_m2} (ref >59)
Glucose: 93 mg/dL (ref 65–99)
Potassium: 4.7 mmol/L (ref 3.5–5.2)
Sodium: 140 mmol/L (ref 134–144)

## 2019-09-26 NOTE — Patient Instructions (Addendum)
Medication Instructions:  Your physician recommends that you continue on your current medications as directed. Please refer to the Current Medication list given to you today.  If you need a refill on your cardiac medications before your next appointment, please call your pharmacy.   Lab work: TODAY: BMET, CBC If you have labs (blood work) drawn today and your tests are completely normal, you will receive your results only by: Marland Kitchen MyChart Message (if you have MyChart) OR . A paper copy in the mail If you have any lab test that is abnormal or we need to change your treatment, we will call you to review the results.  Testing/Procedures: Your physician has requested that you have cardiac CT. Cardiac computed tomography (CT) is a painless test that uses an x-ray machine to take clear, detailed pictures of your heart. For further information please visit [facility]uge[facility]iest[facility]tn. Please follow instruction sheet as given.  Your physician has requested that you have a cardiac catheterization. Cardiac catheterization is used to diagnose and/or treat various heart conditions. Doctors may recommend this procedure for a number of different reasons. The most common reason is to evaluate chest pain. Chest pain can be a symptom of coronary artery disease (CAD), and cardiac catheterization can show whether plaque is narrowing or blocking your heart's arteries. This procedure is also used to evaluate the valves, as well as measure the blood flow and oxygen levels in different parts of your heart. For further information please visit [facility]ugeFiesta[facility]tn. Please follow instruction sheet, as given.   Follow-Up: At [facility], you and your health needs are our priority.  As part of our continuing mission to provide you with exceptional heart care, we have created designated Provider Care Teams.  These Care Teams include your primary Cardiologist (physician) and Advanced Practice Providers (APPs -  Physician Assistants  and Nurse Practitioners) who all work together to provide you with the care you need, when you need it. You will need a follow up appointment in 3 weeks. You may see [name], MD or one of the following Advanced Practice Providers on your designated Care Team:   Truitt Merle, NP Cecilie Kicks, NP . Kathyrn Drown, NP  Your physician recommends that you schedule a follow-up appointment with Dr. Servando Snare.    [facility] OFFICE [place], [place] [address] [place] [address] Dept: [phone] Loc: [phone]  Jose [name]  09/26/2019  You are scheduled for a Cardiac Catheterization on Wednesday, October 21 with Dr. Sherren Mocha.  1. Please arrive at the [facility] (Main Entrance A) at [facility]: [address] [place],  [address] at 6:30 AM (This time is two hours before your procedure to ensure your preparation). Free valet parking service is available.   Special note: Every effort is made to have your procedure done on time. Please understand that emergencies sometimes delay scheduled procedures.  2. Diet: Do not eat solid foods after midnight.  The patient may have clear liquids until 5am upon the day of the procedure.  3. Labs: Today  4. Medication instructions in preparation for your procedure:  On the morning of your procedure, take your Aspirin and any morning medicines NOT listed above.  You may use sips of water.  5. Plan for one night stay--bring personal belongings. 6. Bring a current list of your medications and current insurance cards. 7. You MUST have a responsible person to drive you home. 8. Someone MUST be  with you the first 24 hours after you arrive home or your discharge will be delayed. 9. Please wear clothes that are easy to get on and off and wear slip-on shoes.  Thank you for allowing [place] to care for you!   -- [facility]   Your Pre-procedure COVID-19 Testing will be done on 10/04/19 at 10:55 at [place] at [id number], [place], [place] [address]. Once you arrive at the testing site, stay in the right hand lane, go under the building overhang not the tent. If you are tested under the tent your results may not be back before your procedure. Please be on time for your appointment.  After your swab you will be given a mask to wear and instructed to go home and quarantine/no visitors until after your procedure. If you test positive you will be notified and your procedure will be cancelled.    Your cardiac CT will be scheduled at one of the below locations:   [facility] [address] [place], [place] [address] (336) [place] [address] [place], [place] [address] [phone]  If scheduled at [facility], please arrive at the [facility] main entrance of [facility] 30-45 minutes prior to test start time. Proceed to the [facility] Radiology Department (first floor) to check-in and test prep.  If scheduled at [facility], please arrive 15 mins early for check-in and test prep.  Please follow these instructions carefully (unless otherwise directed):  Hold all erectile dysfunction medications at least 3 days (72 hrs) prior to test.  On the Night Before the Test: . Be sure to Drink plenty of water. . Do not consume any caffeinated/decaffeinated beverages or chocolate 12 hours prior to your test. . Do not take any antihistamines 12 hours prior to your test.  On the Day of the Test: . Drink plenty of water. Do not drink any water within one hour of the test. . Do not eat any food 4 hours prior to the test. . You may take your regular medications prior to the test.  . Take metoprolol (Lopressor) two hours prior to test.      After the Test: . Drink  plenty of water. . After receiving IV contrast, you may experience a mild flushed feeling. This is normal. . On occasion, you may experience a mild rash up to 24 hours after the test. This is not dangerous. If this occurs, you can take Benadryl 25 mg and increase your fluid intake. . If you experience trouble breathing, this can be serious. If it is severe call 911 IMMEDIATELY. If it is mild, please call our office. . If you take any of these medications: Glipizide/Metformin, Avandament, Glucavance, please do not take 48 hours after completing test unless otherwise instructed.   Please contact the cardiac imaging nurse navigator should you have any questions/concerns Marchia Bond, RN Navigator Cardiac Imaging [place] [facility] [phone] Office  [phone] Cell

## 2019-09-27 ENCOUNTER — Other Ambulatory Visit (HOSPITAL_COMMUNITY): Payer: Medicare Other

## 2019-09-29 ENCOUNTER — Other Ambulatory Visit: Payer: Self-pay | Admitting: Cardiovascular Disease

## 2019-09-29 DIAGNOSIS — I359 Nonrheumatic aortic valve disorder, unspecified: Secondary | ICD-10-CM

## 2019-09-29 MED ORDER — SODIUM CHLORIDE 0.9% FLUSH: 3.0000 mL | Freq: Two times a day (BID) | INTRAVENOUS | Status: DC

## 2019-09-30 NOTE — Progress Notes (Signed)
Office Visit Note  Patient: Jose [name]             Date of Birth: 14-Nov-1951           MRN: [id number]             PCP: [name], MD Referring: [name], MD Visit Date: 10/07/2019 Occupation: @GUAROCC @  Subjective:  Trigger fingers   History of Present Illness: Jose [name] is a 68 y.o. male with history of gout and osteoarthritis.  Patient is taking allopurinol 300 mg by mouth daily and colchicine 0.6 mg prn.  He denies any gout flares recently.  He presents today with trigger fingers of the right ring and little finger.  He requested a cortisone injection today.  His symptoms have been severe for the past 2 to 3 weeks. He reports that on 07/02/2019 he had a left revision quadricep tendon reconstruction performed by Dr. Elisabeth Most.  He continues to go to physical therapy 3 times weekly.  He states that" he has 6 weeks ahead of schedule.".    Activities of Daily Living:  Patient reports morning stiffness for 2 minutes.   Patient Reports nocturnal pain.  Difficulty dressing/grooming: Denies Difficulty climbing stairs: Denies Difficulty getting out of chair: Denies Difficulty using hands for taps, buttons, cutlery, and/or writing: Denies  Review of Systems  Constitutional: Negative for fatigue and night sweats.  HENT: Negative for mouth sores, mouth dryness and nose dryness.   Eyes: Positive for dryness. Negative for redness.  Respiratory: Negative for cough, hemoptysis, shortness of breath and difficulty breathing.   Cardiovascular: Negative for chest pain, palpitations, hypertension, irregular heartbeat and swelling in legs/feet.  Gastrointestinal: Negative for blood in stool, constipation and diarrhea.  Endocrine: Negative for increased urination.  Genitourinary: Negative for painful urination.  Musculoskeletal: Positive for arthralgias, joint pain, joint swelling and morning stiffness. Negative for myalgias, muscle weakness, muscle tenderness and myalgias.   Skin: Negative for color change, rash, hair loss, nodules/bumps, skin tightness, ulcers and sensitivity to sunlight.  Allergic/Immunologic: Negative for susceptible to infections.  Neurological: Negative for dizziness, fainting, memory loss, night sweats and weakness.  Hematological: Negative for bruising/bleeding tendency and swollen glands.  Psychiatric/Behavioral: Negative for depressed mood and sleep disturbance. The patient is not nervous/anxious.     PMFS History:  Patient Active Problem List   Diagnosis Date Noted  . Candidiasis   . Abnormality of gait   . Hypoalbuminemia due to protein-calorie malnutrition ([place])   . History of gout   . Sleep disturbance   . Constipation   . Rupture of quadriceps tendon, right, sequela 06/19/2017  . Quadriceps tendon rupture, left, sequela 06/19/2017  . Acute blood loss anemia 06/19/2017  . Fall   . History of subdural hematoma   . Post-operative pain   . Recurrent falls   . Quadriceps tendon rupture 06/15/2017  . Primary osteoarthritis of both knees 02/28/2017  . Primary osteoarthritis of both hands 02/28/2017  . Uricacidemia 02/28/2017  . Pain in joint of left knee 02/07/2017  . Idiopathic chronic gout, unspecified site, without tophus (tophi) 11/29/2016  . HEMATURIA UNSPECIFIED 01/25/2009  . ABDOMINAL PAIN 01/25/2009  . XERODERMA 03/10/2008  . UNS ADVRS EFF UNS RX MEDICINAL&BIOLOGICAL SBSTNC 03/10/2008  . ADJUSTMENT REACTION WITH PHYSICAL SYMPTOMS 02/14/2008  . MUSCLE SPASM 02/14/2008  . ACUTE PROSTATITIS 12/16/2007  . BREAST LUMP OR MASS, RIGHT 07/12/2007  . H/O: RCT (rotator cuff tear) 01/13/2007  . GOUT 01/08/2007    Past Medical History:  Diagnosis  Date  . Arthritis    R shoulder, great toes, hands- has gout & has injections in knees with Dr. Estanislado Pandy   . Cancer ([place])    skin ca-   . GERD (gastroesophageal reflux disease)   . History of hiatal hernia   . SDH (subdural hematoma) ([facility])     Family History  Problem  Relation Age of Onset  . Parkinson's disease Mother   . Heart disease Father   . Parkinson's disease Brother   . Diabetes Brother    Past Surgical History:  Procedure Laterality Date  . BRAIN SURGERY  2016   evacuation of SDH  . CARPAL TUNNEL RELEASE Bilateral   . GREEN LIGHT LASER TURP (TRANSURETHRAL RESECTION OF PROSTATE  04/27/2017  . KNEE ARTHROPLASTY    . QUADRICEPS TENDON REPAIR Bilateral 06/15/2017   Procedure: REPAIR QUADRICEP TENDON;  Surgeon: Meredith Pel, MD;  Location: [facility];  Service: Orthopedics;  Laterality: Bilateral;  . SHOULDER SURGERY Right    Social History   Social History Narrative  . Not on file    There is no immunization history on file for this patient.   Objective: Vital Signs: BP 108/71 (BP Location: Left Arm, Patient Position: Sitting, Cuff Size: Normal)   Pulse 85   Resp 16   Ht 6' (1.829 m)   Wt 204 lb (92.5 kg)   BMI 27.67 kg/m    Physical Exam Vitals signs and nursing note reviewed.  Constitutional:      Appearance: He is well-developed.  HENT:     Head: Normocephalic and atraumatic.  Eyes:     Conjunctiva/sclera: Conjunctivae normal.     Pupils: Pupils are equal, round, and reactive to light.  Neck:     Musculoskeletal: Normal range of motion and neck supple.  Cardiovascular:     Rate and Rhythm: Normal rate and regular rhythm.     Heart sounds: Normal heart sounds.  Pulmonary:     Effort: Pulmonary effort is normal.     Breath sounds: Normal breath sounds.  Abdominal:     General: Bowel sounds are normal.     Palpations: Abdomen is soft.  Skin:    General: Skin is warm and dry.     Capillary Refill: Capillary refill takes less than 2 seconds.  Neurological:     Mental Status: He is alert and oriented to person, place, and time.  Psychiatric:        Behavior: Behavior normal.      Musculoskeletal Exam: C-spine limited ROM.  MCPs, PIPs, and DIPs good ROM with no synovitis.  PIP and DIP synovial thickening  consistent with osteoarthritis of both hands.  Right ring and little trigger finger. Hip joints good ROM.  Right knee good ROM.  Left knee limited ROM with some discomfort.  Diffuse swelling of left knee joint.  No tenderness or swelling of ankle joints.   CDAI Exam: CDAI Score: - Patient Global: -; Provider Global: - Swollen: -; Tender: - Joint Exam   No joint exam has been documented for this visit   There is currently no information documented on the homunculus. Go to the Rheumatology activity and complete the homunculus joint exam.  Investigation: No additional findings.  Imaging: No results found.  Recent Labs: Lab Results  Component Value Date   WBC 6.3 09/26/2019   HGB 13.2 09/26/2019   PLT 207 09/26/2019   NA 140 09/26/2019   K 4.7 09/26/2019   CL 103 09/26/2019   CO2 24  09/26/2019   GLUCOSE 93 09/26/2019   BUN 22 09/26/2019   CREATININE 1.04 09/26/2019   BILITOT 0.6 05/06/2019   ALKPHOS 74 05/06/2019   AST 29 05/06/2019   ALT 40 05/06/2019   PROT 6.2 05/06/2019   ALBUMIN 4.1 05/06/2019   CALCIUM 9.4 09/26/2019   GFRAA 84 09/26/2019    Speciality Comments: No specialty comments available.  Procedures:  Hand/UE Inj: R ring A1 for trigger finger on 10/07/2019 5:03 PM Indications: pain Details: 27 G needle, ultrasound-guided volar approach Medications: 0.5 mL lidocaine 1 %; 10 mg triamcinolone acetonide 40 MG/ML Aspirate: 0 mL Outcome: tolerated well, no immediate complications Procedure, treatment alternatives, risks and benefits explained, specific risks discussed. Consent was given by the patient. Immediately prior to procedure a time out was called to verify the correct patient, procedure, equipment, support staff and site/side marked as required. Patient was prepped and draped in the usual sterile fashion.   Hand/UE Inj: R small A1 for trigger finger on 10/07/2019 5:04 PM Indications: pain Details: 27 G needle, ultrasound-guided volar approach  Medications: 0.5 mL lidocaine 1 %; 10 mg triamcinolone acetonide 40 MG/ML Aspirate: 0 mL Outcome: tolerated well, no immediate complications Procedure, treatment alternatives, risks and benefits explained, specific risks discussed. Consent was given by the patient. Immediately prior to procedure a time out was called to verify the correct patient, procedure, equipment, support staff and site/side marked as required. Patient was prepped and draped in the usual sterile fashion.     Allergies: Ciprofloxacin and Quinolones   Assessment / Plan:     Visit Diagnoses: Idiopathic chronic gout of multiple sites without tophus -He has not had recent gout flares.  He is taking Allopurinol 300 mg po daily and colchicine 0.6 mg prn.  Uric acid was 2.8 on 04/14/19.  Future order for uric acid level was placed today.  He was advised to notify us if he develops signs or symptoms of a gout flare.  - Plan: Uric acid  Primary osteoarthritis of both feet: He has no joint pain or joint swelling at this time.   Neuropathy: He has chronic symptomatic neuropathy.   Primary osteoarthritis of both hands: He has PIP and DIP synovial thickening consistent with osteoarthritis of both hands.  No synovitis or tenderness on exam.  He has complete fist formation bilaterally.   Primary osteoarthritis of both knees: he has chronic pain in both knee joints.   Rupture of quadriceps tendon, right, sequela: Doing well.  He has no discomfort or instability at this time.   Quadriceps tendon rupture, left, sequela: He had a left revision quadriceps tendon reconstruction on 07/02/19 performed by Dr. Elisabeth Most.  He has been going to physical therapy 3 times weekly.     Trigger little finger of right hand: He has been experiencing discomfort and locking for the past 2-3 weeks.  He requested an ultrasound guided cortisone injection today.  He tolerated the procedure well.  The procedure note was completed above.   Trigger finger, right ring  finger: He has tenderness and locking.  He requested a cortisone injection today.  Procedure note completed above.   Other medical conditions are listed as follows:   Polio  Subdural hematoma ([place])  Orders: Orders Placed This Encounter  Procedures  . Uric acid   No orders of the defined types were placed in this encounter.   Face-to-face time spent with patient was 30 minutes. Greater than 50% of time was spent in counseling and coordination of care.  Follow-Up Instructions: Return in about 6 months (around 04/06/2020) for Gout, Osteoarthritis.   Ofilia Neas, PA-C   I examined and evaluated the patient with Hazel Sams PA.  Patient's gout has been well controlled.  He has had underlying osteoarthritis.  He had right fourth and fifth trigger finger on examination today.  Per his request both fingers were injected with cortisone as described above.  Post injection precautions were discussed.  A splint was also applied.  The plan of care was discussed as noted above.  Bo Merino, MD  Note - This record has been created using Editor, commissioning.  Chart creation errors have been sought, but may not always  have been located. Such creation errors do not reflect on  the standard of medical care.

## 2019-10-01 ENCOUNTER — Encounter: Payer: Self-pay | Admitting: Rheumatology

## 2019-10-04 ENCOUNTER — Other Ambulatory Visit (HOSPITAL_COMMUNITY)
Admission: RE | Admit: 2019-10-04 | Discharge: 2019-10-04 | Disposition: A | Discharge status: Home / Self Care | Payer: Medicare Other | Source: Ambulatory Visit | Attending: Cardiovascular Disease | Admitting: Cardiovascular Disease

## 2019-10-04 DIAGNOSIS — Z20828 Contact with and (suspected) exposure to other viral communicable diseases: Secondary | ICD-10-CM | POA: Insufficient documentation

## 2019-10-04 DIAGNOSIS — Z01812 Encounter for preprocedural laboratory examination: Secondary | ICD-10-CM | POA: Insufficient documentation

## 2019-10-05 LAB — NOVEL CORONAVIRUS, NAA (HOSP ORDER, SEND-OUT TO REF LAB; TAT 18-24 HRS): SARS-CoV-2, NAA: NOT DETECTED

## 2019-10-06 ENCOUNTER — Ambulatory Visit: Payer: Medicare Other | Admitting: Rheumatology

## 2019-10-07 ENCOUNTER — Telehealth: Payer: Self-pay | Admitting: *Deleted

## 2019-10-07 ENCOUNTER — Encounter: Payer: Self-pay | Admitting: Rheumatology

## 2019-10-07 ENCOUNTER — Ambulatory Visit (INDEPENDENT_AMBULATORY_CARE_PROVIDER_SITE_OTHER): Payer: Medicare Other | Admitting: Rheumatology

## 2019-10-07 ENCOUNTER — Other Ambulatory Visit: Payer: Self-pay

## 2019-10-07 VITALS — BP 108/71 | HR 85 | Resp 16 | Ht 72.0 in | Wt 204.0 lb

## 2019-10-07 DIAGNOSIS — M19042 Primary osteoarthritis, left hand: Secondary | ICD-10-CM

## 2019-10-07 DIAGNOSIS — S065X9A Traumatic subdural hemorrhage with loss of consciousness of unspecified duration, initial encounter: Secondary | ICD-10-CM

## 2019-10-07 DIAGNOSIS — M1A09X0 Idiopathic chronic gout, multiple sites, without tophus (tophi): Secondary | ICD-10-CM

## 2019-10-07 DIAGNOSIS — M19041 Primary osteoarthritis, right hand: Secondary | ICD-10-CM

## 2019-10-07 DIAGNOSIS — M65351 Trigger finger, right little finger: Secondary | ICD-10-CM

## 2019-10-07 DIAGNOSIS — M19072 Primary osteoarthritis, left ankle and foot: Secondary | ICD-10-CM

## 2019-10-07 DIAGNOSIS — S76111S Strain of right quadriceps muscle, fascia and tendon, sequela: Secondary | ICD-10-CM

## 2019-10-07 DIAGNOSIS — G629 Polyneuropathy, unspecified: Secondary | ICD-10-CM | POA: Diagnosis not present

## 2019-10-07 DIAGNOSIS — S065XAA Traumatic subdural hemorrhage with loss of consciousness status unknown, initial encounter: Secondary | ICD-10-CM

## 2019-10-07 DIAGNOSIS — M65341 Trigger finger, right ring finger: Secondary | ICD-10-CM

## 2019-10-07 DIAGNOSIS — M19071 Primary osteoarthritis, right ankle and foot: Secondary | ICD-10-CM

## 2019-10-07 DIAGNOSIS — A809 Acute poliomyelitis, unspecified: Secondary | ICD-10-CM

## 2019-10-07 DIAGNOSIS — M170 Bilateral primary osteoarthritis of knee: Secondary | ICD-10-CM

## 2019-10-07 DIAGNOSIS — S76112S Strain of left quadriceps muscle, fascia and tendon, sequela: Secondary | ICD-10-CM

## 2019-10-07 NOTE — Telephone Encounter (Signed)
Pt contacted pre-catheterization scheduled at [facility] for: Wednesday October 08, 2019 8:30 AM Verified arrival time and place: [facility]) at: 6:30 AM   No solid food after midnight prior to cath, clear liquids until 5 AM day of procedure. Contrast allergy: no  AM meds can be  taken pre-cath with sip of water including: ASA 81 mg   Confirmed patient has responsible adult to drive home post procedure and observe 24 hours after arriving home: yes  Currently, due to Covid-19 pandemic, only one support person will be allowed with patient. Must be the same support person for that patient's entire stay, will be screened and required to wear a mask. They will be asked to wait in the waiting room for the duration of the patient's stay.  Patients are required to wear a mask when they enter the hospital.     COVID-19 Pre-Screening Questions:  . In the past 7 to 10 days have you had a cough,  shortness of breath, headache, congestion, fever (100 or greater) body aches, chills, sore throat, or sudden loss of taste or sense of smell? no . Have you been around anyone with known Covid 19? no . Have you been around anyone who is awaiting Covid 19 test results in the past 7 to 10 days? . Have you been around anyone who has been exposed to Covid 19, or has mentioned symptoms of Covid 19 within the past 7 to 10 days? no  I reviewed procedure/mask/visitor instructions, Covid-19 screening questions with patient, he verbalized understanding, thanked me for call.

## 2019-10-08 ENCOUNTER — Encounter (HOSPITAL_COMMUNITY)
Admission: RE | Disposition: A | Discharge status: Home / Self Care | Payer: Self-pay | Source: Home / Self Care | Attending: Cardiovascular Disease

## 2019-10-08 ENCOUNTER — Ambulatory Visit (HOSPITAL_COMMUNITY)
Admission: RE | Admit: 2019-10-08 | Discharge: 2019-10-08 | Disposition: A | Discharge status: Home / Self Care | Payer: Medicare Other | Attending: Cardiovascular Disease | Admitting: Cardiovascular Disease

## 2019-10-08 ENCOUNTER — Encounter (HOSPITAL_COMMUNITY): Payer: Self-pay | Admitting: Cardiovascular Disease

## 2019-10-08 ENCOUNTER — Other Ambulatory Visit: Payer: Self-pay

## 2019-10-08 DIAGNOSIS — I10 Essential (primary) hypertension: Secondary | ICD-10-CM | POA: Diagnosis not present

## 2019-10-08 DIAGNOSIS — I350 Nonrheumatic aortic (valve) stenosis: Secondary | ICD-10-CM

## 2019-10-08 DIAGNOSIS — M109 Gout, unspecified: Secondary | ICD-10-CM | POA: Insufficient documentation

## 2019-10-08 DIAGNOSIS — M1990 Unspecified osteoarthritis, unspecified site: Secondary | ICD-10-CM | POA: Diagnosis not present

## 2019-10-08 DIAGNOSIS — Z79899 Other long term (current) drug therapy: Secondary | ICD-10-CM | POA: Insufficient documentation

## 2019-10-08 DIAGNOSIS — Z881 Allergy status to other antibiotic agents status: Secondary | ICD-10-CM | POA: Diagnosis not present

## 2019-10-08 DIAGNOSIS — I2510 Atherosclerotic heart disease of native coronary artery without angina pectoris: Secondary | ICD-10-CM | POA: Insufficient documentation

## 2019-10-08 DIAGNOSIS — E785 Hyperlipidemia, unspecified: Secondary | ICD-10-CM | POA: Insufficient documentation

## 2019-10-08 DIAGNOSIS — K219 Gastro-esophageal reflux disease without esophagitis: Secondary | ICD-10-CM | POA: Insufficient documentation

## 2019-10-08 DIAGNOSIS — I359 Nonrheumatic aortic valve disorder, unspecified: Secondary | ICD-10-CM

## 2019-10-08 HISTORY — PX: LEFT HEART CATH AND CORONARY ANGIOGRAPHY: CATH118249

## 2019-10-08 SURGERY — LEFT HEART CATH AND CORONARY ANGIOGRAPHY
Anesthesia: LOCAL

## 2019-10-08 MED ORDER — HEPARIN SODIUM (PORCINE) 1000 UNIT/ML IJ SOLN
INTRAMUSCULAR | Status: AC
  Filled 2019-10-08: qty 1

## 2019-10-08 MED ORDER — HYDRALAZINE HCL 20 MG/ML IJ SOLN: 10.0000 mg | INTRAMUSCULAR | Status: DC | PRN

## 2019-10-08 MED ORDER — IOHEXOL 350 MG/ML SOLN
INTRAVENOUS | Status: DC | PRN
  Administered 2019-10-08: 60 mL

## 2019-10-08 MED ORDER — SODIUM CHLORIDE 0.9 % WEIGHT BASED INFUSION
3.0000 mL/kg/h | INTRAVENOUS | Status: AC
  Administered 2019-10-08: 3 mL/kg/h via INTRAVENOUS

## 2019-10-08 MED ORDER — FENTANYL CITRATE (PF) 100 MCG/2ML IJ SOLN
INTRAMUSCULAR | Status: AC
  Filled 2019-10-08: qty 2

## 2019-10-08 MED ORDER — SODIUM CHLORIDE 0.9% FLUSH: 3.0000 mL | Freq: Two times a day (BID) | INTRAVENOUS | Status: DC

## 2019-10-08 MED ORDER — FENTANYL CITRATE (PF) 100 MCG/2ML IJ SOLN
INTRAMUSCULAR | Status: DC | PRN
  Administered 2019-10-08 (×2): 25 ug via INTRAVENOUS

## 2019-10-08 MED ORDER — VERAPAMIL HCL 2.5 MG/ML IV SOLN
INTRAVENOUS | Status: AC
  Filled 2019-10-08: qty 2

## 2019-10-08 MED ORDER — LIDOCAINE HCL (PF) 1 % IJ SOLN
INTRAMUSCULAR | Status: AC
  Filled 2019-10-08: qty 30

## 2019-10-08 MED ORDER — LIDOCAINE HCL (PF) 1 % IJ SOLN
INTRAMUSCULAR | Status: DC | PRN
  Administered 2019-10-08 (×2): 2 mL via INTRADERMAL

## 2019-10-08 MED ORDER — ASPIRIN 81 MG PO CHEW: 81.0000 mg | CHEWABLE_TABLET | ORAL | Status: DC

## 2019-10-08 MED ORDER — LABETALOL HCL 5 MG/ML IV SOLN: 10.0000 mg | INTRAVENOUS | Status: DC | PRN

## 2019-10-08 MED ORDER — NITROGLYCERIN 1 MG/10 ML FOR IR/CATH LAB
INTRA_ARTERIAL | Status: AC
  Filled 2019-10-08: qty 10

## 2019-10-08 MED ORDER — HEPARIN SODIUM (PORCINE) 1000 UNIT/ML IJ SOLN
INTRAMUSCULAR | Status: DC | PRN
  Administered 2019-10-08: 5000 [IU] via INTRAVENOUS

## 2019-10-08 MED ORDER — VERAPAMIL HCL 2.5 MG/ML IV SOLN
INTRAVENOUS | Status: DC | PRN
  Administered 2019-10-08: 10 mL via INTRA_ARTERIAL

## 2019-10-08 MED ORDER — MIDAZOLAM HCL 2 MG/2ML IJ SOLN
INTRAMUSCULAR | Status: AC
  Filled 2019-10-08: qty 2

## 2019-10-08 MED ORDER — SODIUM CHLORIDE 0.9% FLUSH: 3.0000 mL | INTRAVENOUS | Status: DC | PRN

## 2019-10-08 MED ORDER — ACETAMINOPHEN 325 MG PO TABS
ORAL_TABLET | ORAL | Status: AC
  Administered 2019-10-08: 650 mg via ORAL
  Filled 2019-10-08: qty 2

## 2019-10-08 MED ORDER — ACETAMINOPHEN 325 MG PO TABS
650.0000 mg | ORAL_TABLET | ORAL | Status: DC | PRN
  Administered 2019-10-08: 10:00:00 650 mg via ORAL

## 2019-10-08 MED ORDER — HEPARIN (PORCINE) IN NACL 1000-0.9 UT/500ML-% IV SOLN
INTRAVENOUS | Status: DC | PRN
  Administered 2019-10-08 (×2): 500 mL

## 2019-10-08 MED ORDER — ONDANSETRON HCL 4 MG/2ML IJ SOLN: 4.0000 mg | Freq: Four times a day (QID) | INTRAMUSCULAR | Status: DC | PRN

## 2019-10-08 MED ORDER — SODIUM CHLORIDE 0.9 % IV SOLN: 250.0000 mL | INTRAVENOUS | Status: DC | PRN

## 2019-10-08 MED ORDER — SODIUM CHLORIDE 0.9 % WEIGHT BASED INFUSION: 1.0000 mL/kg/h | INTRAVENOUS | Status: DC

## 2019-10-08 MED ORDER — HEPARIN (PORCINE) IN NACL 1000-0.9 UT/500ML-% IV SOLN
INTRAVENOUS | Status: AC
  Filled 2019-10-08: qty 1000

## 2019-10-08 MED ORDER — MIDAZOLAM HCL 2 MG/2ML IJ SOLN
INTRAMUSCULAR | Status: DC | PRN
  Administered 2019-10-08: 2 mg via INTRAVENOUS
  Administered 2019-10-08: 1 mg via INTRAVENOUS

## 2019-10-08 MED ORDER — NITROGLYCERIN 1 MG/10 ML FOR IR/CATH LAB
INTRA_ARTERIAL | Status: DC | PRN
  Administered 2019-10-08: 200 ug via INTRAVENOUS

## 2019-10-08 SURGICAL SUPPLY — 15 items
CATH 5FR JL3.5 JR4 ANG PIG MP (CATHETERS) ×1 IMPLANT
CATH BALLN WEDGE 5F 110CM (CATHETERS) ×1 IMPLANT
CATH LAUNCHER 5F JR4 (CATHETERS) ×1 IMPLANT
DEVICE RAD COMP TR BAND LRG (VASCULAR PRODUCTS) ×1 IMPLANT
GLIDESHEATH SLEND SS 6F .021 (SHEATH) ×1 IMPLANT
GUIDEWIRE .025 260CM (WIRE) ×1 IMPLANT
GUIDEWIRE INQWIRE 1.5J.035X260 (WIRE) IMPLANT
INQWIRE 1.5J .035X260CM (WIRE) ×2
KIT HEART LEFT (KITS) ×2 IMPLANT
KIT HEMO VALVE WATCHDOG (MISCELLANEOUS) ×1 IMPLANT
PACK CARDIAC CATHETERIZATION (CUSTOM PROCEDURE TRAY) ×2 IMPLANT
SHEATH GLIDE SLENDER 4/5FR (SHEATH) ×1 IMPLANT
TRANSDUCER W/STOPCOCK (MISCELLANEOUS) ×2 IMPLANT
TUBING CIL FLEX 10 FLL-RA (TUBING) ×2 IMPLANT
WIRE MICROINTRODUCER 60CM (WIRE) ×1 IMPLANT

## 2019-10-08 NOTE — Discharge Instructions (Signed)
Radial Site Care ° °This sheet gives you information about how to care for yourself after your procedure. Your health care provider may also give you more specific instructions. If you have problems or questions, contact your health care provider. °What can I expect after the procedure? °After the procedure, it is common to have: °· Bruising and tenderness at the catheter insertion area. °Follow these instructions at home: °Medicines °· Take over-the-counter and prescription medicines only as told by your health care provider. °Insertion site care °· Follow instructions from your health care provider about how to take care of your insertion site. Make sure you: °? Wash your hands with soap and water before you change your bandage (dressing). If soap and water are not available, use hand sanitizer. °? Change your dressing as told by your health care provider. °? Leave stitches (sutures), skin glue, or adhesive strips in place. These skin closures may need to stay in place for 2 weeks or longer. If adhesive strip edges start to loosen and curl up, you may trim the loose edges. Do not remove adhesive strips completely unless your health care provider tells you to do that. °· Check your insertion site every day for signs of infection. Check for: °? Redness, swelling, or pain. °? Fluid or blood. °? Pus or a bad smell. °? Warmth. °· Do not take baths, swim, or use a hot tub until your health care provider approves. °· You may shower 24-48 hours after the procedure, or as directed by your health care provider. °? Remove the dressing and gently wash the site with plain soap and water. °? Pat the area dry with a clean towel. °? Do not rub the site. That could cause bleeding. °· Do not apply powder or lotion to the site. °Activity ° °· For 24 hours after the procedure, or as directed by your health care provider: °? Do not flex or bend the affected arm. °? Do not push or pull heavy objects with the affected arm. °? Do not  drive yourself home from the hospital or clinic. You may drive 24 hours after the procedure unless your health care provider tells you not to. °? Do not operate machinery or power tools. °· Do not lift anything that is heavier than 10 lb (4.5 kg), or the limit that you are told, until your health care provider says that it is safe. °· Ask your health care provider when it is okay to: °? Return to work or school. °? Resume usual physical activities or sports. °? Resume sexual activity. °General instructions °· If the catheter site starts to bleed, raise your arm and put firm pressure on the site. If the bleeding does not stop, get help right away. This is a medical emergency. °· If you went home on the same day as your procedure, a responsible adult should be with you for the first 24 hours after you arrive home. °· Keep all follow-up visits as told by your health care provider. This is important. °Contact a health care provider if: °· You have a fever. °· You have redness, swelling, or yellow drainage around your insertion site. °Get help right away if: °· You have unusual pain at the radial site. °· The catheter insertion area swells very fast. °· The insertion area is bleeding, and the bleeding does not stop when you hold steady pressure on the area. °· Your arm or hand becomes pale, cool, tingly, or numb. °These symptoms may represent a serious problem   that is an emergency. Do not wait to see if the symptoms will go away. Get medical help right away. Call your local emergency services (911 in the [place] Do not drive yourself to the hospital. °Summary °· After the procedure, it is common to have bruising and tenderness at the site. °· Follow instructions from your health care provider about how to take care of your radial site wound. Check the wound every day for signs of infection. °· Do not lift anything that is heavier than 10 lb (4.5 kg), or the limit that you are told, until your health care provider says  that it is safe. °This information is not intended to replace advice given to you by your health care provider. Make sure you discuss any questions you have with your health care provider. °Document Released: 01/06/2011 Document Revised: 01/09/2018 Document Reviewed: 01/09/2018 °Elsevier Patient Education © 2020 [employer] ° °

## 2019-10-08 NOTE — Interval H&P Note (Signed)
History and Physical Interval Note:  10/08/2019 7:34 AM  Jose [name]  has presented today for surgery, with the diagnosis of Aortic stenosis.  The various methods of treatment have been discussed with the patient and family. After consideration of risks, benefits and other options for treatment, the patient has consented to  Procedure(s): RIGHT/LEFT HEART CATH AND CORONARY ANGIOGRAPHY (N/A) as a surgical intervention.  The patient's history has been reviewed, patient examined, no change in status, stable for surgery.  I have reviewed the patient's chart and labs.  Questions were answered to the patient's satisfaction.     Sherren Mocha

## 2019-10-08 NOTE — Research (Signed)
PHDEV Informed Consent   Subject Name: Jose [name]  Subject met inclusion and exclusion criteria.  The informed consent form, study requirements and expectations were reviewed with the subject and questions and concerns were addressed prior to the signing of the consent form.  The subject verbalized understanding of the trial requirements.  The subject agreed to participate in the [facility] trial and signed the informed consent at 0755 on 10/08/2019.  The informed consent was obtained prior to performance of any protocol-specific procedures for the subject.  A copy of the signed informed consent was given to the subject and a copy was placed in the subject's medical record.   Zayley Arras

## 2019-10-20 ENCOUNTER — Encounter: Payer: Medicare Other | Admitting: Thoracic Surgery (Cardiothoracic Vascular Surgery)

## 2019-10-22 ENCOUNTER — Ambulatory Visit (HOSPITAL_COMMUNITY): Payer: Medicare Other

## 2019-10-22 ENCOUNTER — Other Ambulatory Visit (HOSPITAL_COMMUNITY): Payer: Medicare Other

## 2019-10-27 ENCOUNTER — Ambulatory Visit: Payer: Medicare Other | Admitting: Cardiothoracic Surgery

## 2019-10-28 NOTE — Progress Notes (Signed)
Date:  10/28/2019   ID:  Jose [name], DOB October 13, 1951, MRN [id number]  Provider Location: Office  PCP:  Jose Spurling, MD  Cardiologist:   Jose [name] Electrophysiologist:  None   Evaluation Performed:  Follow-Up Visit  Chief Complaint:  Bicuspid AV/Aortic aneurysm   History of Present Illness:    Jose [name] a 68 y.o.malewho presents for f/u vascular disease and bicuspid AV with severe  AS Followed by Dr Jose [name] for ascending aortic dilatations. Reviewed CTA 6/2/20and measured stable 4.3 cm The patient has a history of intracranial bleed/ subdural hematoma without history of trauma treated in [place]. He underwent right craniotomy. 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 With stable 4.3 cm aortic root may be candidate for TAVR  He is low risk and would be fine for SAVR and tissue AVR. Traditionally for bicuspid AV would have bental for changing aorta or root 4.5 cm or above. He seems inclined not to have open surgery Has some CAD but not bad enough to need CABG.    Past Medical History:  Diagnosis Date  . Arthritis    R shoulder, great toes, hands- has gout & has injections in knees with Dr. Estanislado Pandy   . Cancer ([facility])    skin ca-   . GERD (gastroesophageal reflux disease)   . History of hiatal hernia   . SDH  (subdural hematoma) ([facility])    Past Surgical History:  Procedure Laterality Date  . BRAIN SURGERY  2016   evacuation of SDH  . CARPAL TUNNEL RELEASE Bilateral   . GREEN LIGHT LASER TURP (TRANSURETHRAL RESECTION OF PROSTATE  04/27/2017  . KNEE ARTHROPLASTY    . LEFT HEART CATH AND CORONARY ANGIOGRAPHY N/A 10/08/2019   Procedure: LEFT HEART CATH AND CORONARY ANGIOGRAPHY;  Surgeon: Sherren Mocha, MD;  Location: [facility];  Service: Cardiovascular;  Laterality: N/A;  . QUADRICEPS TENDON REPAIR Bilateral 06/15/2017   Procedure: REPAIR QUADRICEP TENDON;  Surgeon: Meredith Pel, MD;  Location: [facility];  Service: Orthopedics;  Laterality: Bilateral;  . SHOULDER SURGERY Right      No outpatient medications have been marked as taking for the 11/03/19 encounter (Appointment) with Jose Hector, MD.   Current Facility-Administered Medications for the 11/03/19 encounter (Appointment) with Jose Hector, MD  Medication  . sodium chloride flush (NS) 0.9 % injection 3 mL     Allergies:   Ciprofloxacin and Quinolones   Social History   Tobacco Use  . Smoking status: Never Smoker  . Smokeless tobacco: Never Used  Substance Use Topics  . Alcohol use: Yes    Alcohol/week: 8.0 standard drinks    Types: 7 Glasses of wine, 1 Shots of liquor per week  . Drug use: No     Family Hx: The patient's family  history includes Diabetes in his brother; Heart disease in his father; Parkinson's disease in his brother and mother.  ROS:   Please see the history of present illness.     All other systems reviewed and are negative.   Prior CV studies:   The following studies were reviewed today:  Echo March 2020 Cardiac CTA June 2020  Labs/Other Tests and Data Reviewed:    EKG:  Not performed this visit   Recent Labs: 05/06/2019: ALT 40 09/26/2019: BUN 22; Creatinine, Ser 1.04; Hemoglobin 13.2; Platelets 207; Potassium 4.7; Sodium 140   Recent Lipid Panel Lab Results  Component  Value Date/Time   CHOL 165 05/06/2019 09:26 AM   TRIG 135 05/06/2019 09:26 AM   HDL 63 05/06/2019 09:26 AM   CHOLHDL 2.6 05/06/2019 09:26 AM   CHOLHDL 3.7 CALC 12/25/2007 04:36 PM   LDLCALC 75 05/06/2019 09:26 AM   LDLDIRECT 164.4 12/25/2007 04:36 PM    Wt Readings from Last 3 Encounters:  10/08/19 197 lb (89.4 kg)  10/07/19 204 lb (92.5 kg)  09/26/19 206 lb (93.4 kg)     Objective:    Vital Signs:  There were no vitals taken for this visit.   Affect appropriate Healthy:  appears stated age 35: normal Neck supple with no adenopathy JVP normal no bruits no thyromegaly Lungs clear with no wheezing and good diaphragmatic motion Heart:  S1/S2 AS murmur, no rub, gallop or click PMI normal Abdomen: benighn, BS positve, no tenderness, no AAA no bruit.  No HSM or HJR Distal pulses intact with no bruits No edema Neuro non-focal Skin warm and dry Previous quad surgery     ASSESSMENT & PLAN:    Bicuspid AV: with progressive mean gradient 09/26/19 45 mmHg peak 69 mmHg CAth 10/08/19 Dr Burt Knack with moderate eccentric LAD disease and small D1 not suitable for PCI. Aortic root is stable at 4.3 cm so could be a candidate for TAVR will see Dr Jose [name] this week to discuss   Aneurysm: followed by Dr Jose [name] 4.3 cm on cardiac CT June 2020 and unchanged on TAVR CT from today 11/16//20   HTN:  Well controlled.  Continue current medications and low sodium Dash type diet.    HLD:  Continue statin target LDL less than 70 with aneurysm f/u labs ordered this week LDL 75   Gout: continue allopurinol no attacks   Ortho:  Post quad surgery July [place] improved   COVID-19 Education: The signs and symptoms of COVID-19 were discussed with the patient and how to seek care for testing (follow up with PCP or arrange E-visit).  The importance of social distancing was discussed today.  Time:   45 minutes     Medication Adjustments/Labs and Tests Ordered: Current medicines are reviewed at  length with the patient today.  Concerns regarding medicines are outlined above.   Tests Ordered:  Lipid and Liver   Medication Changes:  None  Disposition:  Follow up after testing Refer to [facility] surgical opinion    Signed, [name], MD  10/28/2019 6:22 PM    [place]

## 2019-10-30 ENCOUNTER — Ambulatory Visit: Payer: Medicare Other | Admitting: Cardiovascular Disease

## 2019-10-31 ENCOUNTER — Other Ambulatory Visit: Payer: Self-pay | Admitting: Rheumatology

## 2019-10-31 ENCOUNTER — Telehealth: Payer: Self-pay | Admitting: Rheumatology

## 2019-10-31 NOTE — Telephone Encounter (Signed)
-----   Message from [place] sent at 10/31/2019  9:17 AM EST ----- Please call patient to schedule follow up, patient is due 03/2020. Thanks!

## 2019-10-31 NOTE — Telephone Encounter (Signed)
Patient called stating he had labwork at his appointment with Dr. Johnsie Cancel on 09/26/19 and requesting a return call to let him know if there is any additional labwork that Dr. Estanislado Pandy needs.

## 2019-10-31 NOTE — Telephone Encounter (Addendum)
Last Visit: 10/07/2019 Next Visit: message sent to front desk to schedule.  Labs: 09/26/2019 CBC and BMP WNL   Okay to refill per Dr. Estanislado Pandy.

## 2019-10-31 NOTE — Telephone Encounter (Signed)
LMOM for patient to call and schedule follow-up appointment with Dr. Deveshwar ° °

## 2019-10-31 NOTE — Telephone Encounter (Signed)
Attempted to contact patient and left message on machine to advise patient he does need a uric acid level drawn. Advised patient of lab hours.

## 2019-11-03 ENCOUNTER — Ambulatory Visit (HOSPITAL_COMMUNITY): Payer: Medicare Other

## 2019-11-03 ENCOUNTER — Ambulatory Visit (INDEPENDENT_AMBULATORY_CARE_PROVIDER_SITE_OTHER): Payer: Medicare Other | Admitting: Cardiovascular Disease

## 2019-11-03 ENCOUNTER — Other Ambulatory Visit: Payer: Self-pay

## 2019-11-03 ENCOUNTER — Ambulatory Visit (HOSPITAL_COMMUNITY)
Admission: RE | Admit: 2019-11-03 | Discharge: 2019-11-03 | Disposition: A | Discharge status: Home / Self Care | Payer: Medicare Other | Source: Ambulatory Visit | Attending: Cardiovascular Disease | Admitting: Cardiovascular Disease

## 2019-11-03 ENCOUNTER — Encounter (HOSPITAL_COMMUNITY): Payer: Self-pay

## 2019-11-03 ENCOUNTER — Encounter: Payer: Self-pay | Admitting: Cardiovascular Disease

## 2019-11-03 VITALS — BP 122/72 | HR 87 | Ht 72.0 in | Wt 198.0 lb

## 2019-11-03 DIAGNOSIS — I350 Nonrheumatic aortic (valve) stenosis: Secondary | ICD-10-CM | POA: Diagnosis present

## 2019-11-03 DIAGNOSIS — M1A09X0 Idiopathic chronic gout, multiple sites, without tophus (tophi): Secondary | ICD-10-CM

## 2019-11-03 MED ORDER — IOHEXOL 350 MG/ML SOLN
100.0000 mL | Freq: Once | INTRAVENOUS | Status: AC | PRN
  Administered 2019-11-03: 12:00:00 100 mL via INTRAVENOUS

## 2019-11-03 NOTE — Discharge Instructions (Signed)
Cardiac CT Angiogram  A cardiac CT angiogram is a procedure to look at the heart and the area around the heart. It may be done to help find the cause of chest pains or other symptoms of heart disease. During this procedure, a large X-ray machine, called a CT scanner, takes detailed pictures of the heart and the surrounding area after a dye (contrast material) has been injected into blood vessels in the area. The procedure is also sometimes called a coronary CT angiogram, coronary artery scanning, or CTA. A cardiac CT angiogram allows the health care provider to see how well blood is flowing to and from the heart. The health care provider will be able to see if there are any problems, such as:  Blockage or narrowing of the coronary arteries in the heart.  Fluid around the heart.  Signs of weakness or disease in the muscles, valves, and tissues of the heart. Tell a health care provider about:  Any allergies you have. This is especially important if you have had a previous allergic reaction to contrast dye.  All medicines you are taking, including vitamins, herbs, eye drops, creams, and over-the-counter medicines.  Any blood disorders you have.  Any surgeries you have had.  Any medical conditions you have.  Whether you are pregnant or may be pregnant.  Any anxiety disorders, chronic pain, or other conditions you have that may increase your stress or prevent you from lying still. What are the risks? Generally, this is a safe procedure. However, problems may occur, including:  Bleeding.  Infection.  Allergic reactions to medicines or dyes.  Damage to other structures or organs.  Kidney damage from the dye or contrast that is used.  Increased risk of cancer from radiation exposure. This risk is low. Talk with your health care provider about: ? The risks and benefits of testing. ? How you can receive the lowest dose of radiation. What happens before the procedure?  Wear  comfortable clothing and remove any jewelry, glasses, dentures, and hearing aids.  Follow instructions from your health care provider about eating and drinking. This may include: ? For 12 hours before the test -- avoid caffeine. This includes tea, coffee, soda, energy drinks, and diet pills. Drink plenty of water or other fluids that do not have caffeine in them. Being well-hydrated can prevent complications. ? For 4-6 hours before the test -- stop eating and drinking. The contrast dye can cause nausea, but this is less likely if your stomach is empty.  Ask your health care provider about changing or stopping your regular medicines. This is especially important if you are taking diabetes medicines, blood thinners, or medicines to treat erectile dysfunction. What happens during the procedure?  Hair on your chest may need to be removed so that small sticky patches called electrodes can be placed on your chest. These will transmit information that helps to monitor your heart during the test.  An IV tube will be inserted into one of your veins.  You might be given a medicine to control your heart rate during the test. This will help to ensure that good images are obtained.  You will be asked to lie on an exam table. This table will slide in and out of the CT machine during the procedure.  Contrast dye will be injected into the IV tube. You might feel warm, or you may get a metallic taste in your mouth.  You will be given a medicine (nitroglycerin) to relax (dilate) the arteries   in your heart.  The table that you are lying on will move into the CT machine tunnel for the scan.  The person running the machine will give you instructions while the scans are being done. You may be asked to: ? Keep your arms above your head. ? Hold your breath. ? Stay very still, even if the table is moving.  When the scanning is complete, you will be moved out of the machine.  The IV tube will be removed. The  procedure may vary among health care providers and hospitals. What happens after the procedure?  You might feel warm, or you may get a metallic taste in your mouth from the contrast dye.  You may have a headache from the nitroglycerin.  After the procedure, drink water or other fluids to wash (flush) the contrast material out of your body.  Contact a health care provider if you have any symptoms of allergy to the contrast. These symptoms include: ? Shortness of breath. ? Rash or hives. ? A racing heartbeat.  Most people can return to their normal activities right after the procedure. Ask your health care provider what activities are safe for you.  It is up to you to get the results of your procedure. Ask your health care provider, or the department that is doing the procedure, when your results will be ready. Summary  A cardiac CT angiogram is a procedure to look at the heart and the area around the heart. It may be done to help find the cause of chest pains or other symptoms of heart disease.  During this procedure, a large X-ray machine, called a CT scanner, takes detailed pictures of the heart and the surrounding area after a dye (contrast material) has been injected into blood vessels in the area.  Ask your health care provider about changing or stopping your regular medicines before the procedure. This is especially important if you are taking diabetes medicines, blood thinners, or medicines to treat erectile dysfunction.  After the procedure, drink water or other fluids to wash (flush) the contrast material out of your body. This information is not intended to replace advice given to you by your health care provider. Make sure you discuss any questions you have with your health care provider. Document Released: 11/16/2008 Document Revised: 11/16/2017 Document Reviewed: 10/23/2016 Elsevier Patient Education  2020 Elsevier Inc. Testing With IV Contrast Material IV contrast material  is a fluid that is used with some imaging tests. It is injected into your body through a vein. Contrast material is used when your health care providers need a detailed look at organs, tissues, or blood vessels that may not show up with the standard test. The material may be used when an X-ray, an MRI, a CT scan, or an ultrasound is done. IV contrast material may be used for imaging tests that check:  Muscles, skin, and fat.  Breasts.  Brain.  Digestive tract.  Heart.  Organs such as the liver, kidneys, lungs, bladder, and many others.  Arteries and veins. Tell a health care provider about:  Any allergies you have, especially an allergy to contrast material.  All medicines you are taking, including metformin, beta blockers, NSAIDs (such as ibuprofen), interleukin-2, vitamins, herbs, eye drops, creams, and over-the-counter medicines.  Any problems you or family members have had with the use of contrast material.  Any blood disorders you have, such as sickle cell anemia.  Any surgeries you have had.  Any medical conditions you have or   have had, especially alcohol abuse, dehydration, asthma, or kidney, liver, or heart problems.  Whether you are pregnant or may be pregnant.  Whether you are breastfeeding. Most contrast materials are safe for use in breastfeeding women. What are the risks? Generally, this is a safe procedure. However, problems may occur, including:  Headache.  Itching, skin rash, and hives.  Nausea and vomiting.  Allergic reactions.  Wheezing or difficulty breathing.  Abnormal heart rate.  Changes in blood pressure.  Throat swelling.  Kidney damage. What happens before the procedure? Medicines Ask your health care provider about:  Changing or stopping your regular medicines. This is especially important if you are taking diabetes medicines or blood thinners.  Taking medicines such as aspirin and ibuprofen. These medicines can thin your blood. Do  not take these medicines unless your health care provider tells you to take them.  Taking over-the-counter medicines, vitamins, herbs, and supplements. If you are at risk of having a reaction to the IV contrast material, you may be asked to take medicine before the procedure to prevent a reaction. General instructions  Follow instructions from your health care provider about eating or drinking restrictions.  You may have an exam or lab tests to make sure that you can safely get IV contrast material.  Ask if you will be given a medicine to help you relax (sedative) during the procedure. If so, plan to have someone take you home from the hospital or clinic. What happens during the procedure?  You may be given a sedative to help you relax.  An IV will be inserted into one of your veins.  Contrast material will be injected into your IV.  You may feel warmth or flushing as the contrast material enters your bloodstream.  You may have a metallic taste in your mouth for a few minutes.  The needle may cause some discomfort and bruising.  After the contrast material is in your body, the imaging test will be done. The procedure may vary among health care providers and hospitals. What can I expect after the procedure?  The IV will be removed.  You may be taken to a recovery area if sedation medicines were used. Your blood pressure, heart rate, breathing rate, and blood oxygen level will be monitored until you leave the hospital or clinic. Follow these instructions at home:   Take over-the-counter and prescription medicines only as told by your health care provider. ? Your health care provider may tell you to not take certain medicines for a couple of days after the procedure. This is especially important if you are taking diabetes medicines.  If you are told, drink enough fluid to keep your urine pale yellow. This will help to remove the contrast material out of your body.  Do not drive  for 24 hours if you were given a sedative during your procedure.  It is up to you to get the results of your procedure. Ask your health care provider, or the department that is doing the procedure, when your results will be ready.  Keep all follow-up visits as told by your health care provider. This is important. Contact a health care provider if:  You have redness, swelling, or pain near your IV site. Get help right away if:  You have an abnormal heart rhythm.  You have trouble breathing.  You have: ? Chest pain. ? Pain in your back, neck, arm, jaw, or stomach. ? Nausea or sweating. ? Hives or a rash.  You start   shaking and cannot stop. These symptoms may represent a serious problem that is an emergency. Do not wait to see if the symptoms will go away. Get medical help right away. Call your local emergency services (911 in the [place] Do not drive yourself to the hospital. Summary  IV contrast material may be used for imaging tests to help your health care providers see your organs and tissues more clearly.  Tell your health care provider if you are pregnant or may be pregnant.  During the procedure, you may feel warmth or flushing as the contrast material enters your bloodstream.  After the procedure, drink enough fluid to keep your urine pale yellow. This information is not intended to replace advice given to you by your health care provider. Make sure you discuss any questions you have with your health care provider. Document Released: 11/22/2009 Document Revised: 02/20/2019 Document Reviewed: 02/20/2019 Elsevier Patient Education  2020 Elsevier Inc.  

## 2019-11-03 NOTE — Patient Instructions (Addendum)
Medication Instructions:   *If you need a refill on your cardiac medications before your next appointment, please call your pharmacy*  Lab Work: Your physician recommends that you return for lab work fasting lipid and liver panel on Wednesday. Please come in any time between 7:30 am and 4:30 pm.  If you have labs (blood work) drawn today and your tests are completely normal, you will receive your results only by: Marland Kitchen MyChart Message (if you have MyChart) OR . A paper copy in the mail If you have any lab test that is abnormal or we need to change your treatment, we will call you to review the results.  Testing/Procedures: None ordered today.   Follow-Up: At [facility], you and your health needs are our priority.  As part of our continuing mission to provide you with exceptional heart care, we have created designated Provider Care Teams.  These Care Teams include your primary Cardiologist (physician) and Advanced Practice Providers (APPs -  Physician Assistants and Nurse Practitioners) who all work together to provide you with the care you need, when you need it.  Your next appointment:   6 months  The format for your next appointment:   In Person  Provider:   You may see [name], MD or one of the following Advanced Practice Providers on your designated Care Team:    Truitt Merle, NP  Cecilie Kicks, NP  Kathyrn Drown, NP

## 2019-11-04 LAB — URIC ACID: Uric Acid, Serum: 4 mg/dL (ref 4.0–8.0)

## 2019-11-04 NOTE — Progress Notes (Signed)
Uric acid is within desirable range.

## 2019-11-05 ENCOUNTER — Ambulatory Visit (INDEPENDENT_AMBULATORY_CARE_PROVIDER_SITE_OTHER): Payer: Medicare Other | Admitting: Cardiothoracic Surgery

## 2019-11-05 ENCOUNTER — Other Ambulatory Visit: Payer: Medicare Other | Admitting: *Deleted

## 2019-11-05 ENCOUNTER — Other Ambulatory Visit: Payer: Self-pay

## 2019-11-05 ENCOUNTER — Encounter: Payer: Self-pay | Admitting: Cardiothoracic Surgery

## 2019-11-05 VITALS — BP 157/108 | HR 77 | Temp 97.5°F | Resp 16 | Ht 72.0 in | Wt 202.0 lb

## 2019-11-05 DIAGNOSIS — Q231 Congenital insufficiency of aortic valve: Secondary | ICD-10-CM

## 2019-11-05 DIAGNOSIS — E785 Hyperlipidemia, unspecified: Secondary | ICD-10-CM

## 2019-11-05 DIAGNOSIS — Z79899 Other long term (current) drug therapy: Secondary | ICD-10-CM

## 2019-11-05 LAB — HEPATIC FUNCTION PANEL
ALT: 18 IU/L (ref 0–44)
AST: 19 IU/L (ref 0–40)
Albumin: 4.3 g/dL (ref 3.8–4.8)
Alkaline Phosphatase: 80 IU/L (ref 39–117)
Bilirubin Total: 0.2 mg/dL (ref 0.0–1.2)
Bilirubin, Direct: 0.1 mg/dL (ref 0.00–0.40)
Total Protein: 6.7 g/dL (ref 6.0–8.5)

## 2019-11-05 LAB — LIPID PANEL
Chol/HDL Ratio: 2.2 ratio (ref 0.0–5.0)
Cholesterol, Total: 169 mg/dL (ref 100–199)
HDL: 76 mg/dL (ref >39)
LDL Chol Calc (NIH): 82 mg/dL (ref 0–99)
Triglycerides: 56 mg/dL (ref 0–149)
VLDL Cholesterol Cal: 11 mg/dL (ref 5–40)

## 2019-11-05 NOTE — Progress Notes (Signed)
[address]       [place] [address]             [phone]                    Trew A [facility]ells [facility] Medical Record [id number] Date of Birth: [birth year]  Referring: Sherren Mocha, MD Primary Care: [name], MD Primary Cardiologist: [name], MD  Chief Complaint:    Chief Complaint  Patient presents with   Aortic Stenosis    Consultation    History of Present Illness:    Jose [name] 68 y.o. male is seen in the office  today for discussion of treatment options for his progressive aortic stenosis.  He was first seen in the office 07/06/2015  referred by the emergency room at [facility]  because of a dilated ascending aorta. The patient has no previous history of cardiac disease, no history of known coronary artery disease or valvular disease.  The patient has a history of intracranial bleed/ subdural hematoma without history of trauma treated in [place].   He underwent right craniotomy.  In July 2016 he was having increasing cough discussed this with his neurologist who recommended that he be seen and have a CAT scan of the head done. While in the emergency room a CT scan of the chest was also done., Demonstrating mild dilatation of the ascending aorta. The patient is a nonsmoker.  The patient denies any signs or symptoms of congestive heart failure, denies angina, denies syncope or presyncope.  He has had to limit his activities to some degree as he used to recover from redo quadriceps surgery on the left.  Last year he had bilateral quadriceps repairs.  Serial echocardiograms over the past several years have demonstrated progressive aortic stenosis.     Mean  Peak  AV Vmax  EF 09/2019  45  68  414 cm/sec  55-60  02/2019  31  59  385   50-55 02/2018   30  53  365   50-55 02/2017   22  4  277   50-55   History family history is significant for his father who died in his late 5s with myocardial infarction, after having his first myocardial  infarction in his 81s. There is no family history of aortic aneurysms, unexplained sudden death at an early age or aortic dissection.He has one cousin who died in his 42s of sudden collapse thought to be a brain aneurysm  Current Activity/ Functional Status:  Patient is independent with mobility/ambulation, transfers, ADL's, IADL's.   Zubrod Score: At the time of surgery this patients most appropriate activity status/level should be described as: []     0    Normal activity, no symptoms [x]     1    Restricted in physical strenuous activity but ambulatory, able to do out light work []     2    Ambulatory and capable of self care, unable to do work activities, up and about               >50 % of waking hours                              []     3    Only limited self care, in bed greater than 50% of waking hours []     4    Completely disabled, no self  care, confined to bed or chair []     5    Moribund   Past Medical History:  Diagnosis Date   Arthritis    R shoulder, great toes, hands- has gout & has injections in knees with Dr. Estanislado Pandy    Cancer [facility])    skin ca-    GERD (gastroesophageal reflux disease)    History of hiatal hernia    SDH (subdural hematoma) ([place])     Past Surgical History:  Procedure Laterality Date   BRAIN SURGERY  2016   evacuation of SDH   CARPAL TUNNEL RELEASE Bilateral    GREEN LIGHT LASER TURP (TRANSURETHRAL RESECTION OF PROSTATE  04/27/2017   KNEE ARTHROPLASTY     LEFT HEART CATH AND CORONARY ANGIOGRAPHY N/A 10/08/2019   Procedure: LEFT HEART CATH AND CORONARY ANGIOGRAPHY;  Surgeon: Sherren Mocha, MD;  Location: [facility];  Service: Cardiovascular;  Laterality: N/A;   QUADRICEPS TENDON REPAIR Bilateral 06/15/2017   Procedure: REPAIR QUADRICEP TENDON;  Surgeon: Meredith Pel, MD;  Location: [facility];  Service: Orthopedics;  Laterality: Bilateral;   SHOULDER SURGERY Right     Family History  Problem Relation Age of Onset    Parkinson's disease Mother    Heart disease Father    Parkinson's disease Brother    Diabetes Brother      Social History   Tobacco Use  Smoking Status Never Smoker  Smokeless Tobacco Never Used    Social History   Substance and Sexual Activity  Alcohol Use Yes   Alcohol/week: 8.0 standard drinks   Types: 7 Glasses of wine, 1 Shots of liquor per week     Allergies  Allergen Reactions   Ciprofloxacin Other (See Comments)    Patient states instructed not to take due to history of Aneurysms   Quinolones     Patient was warned about not using Cipro and similar antibiotics. Recent studies have raised concern that fluoroquinolone antibiotics could be associated with an increased risk of aortic aneurysm Fluoroquinolones have non-antimicrobial properties that might jeopardise the integrity of the extracellular matrix of the vascular wall In a  propensity score matched cohort study in [place], there was a 66% increased rate of aortic aneurysm or dissection associated with oral fluoroquinolone use, compared wit    Current Outpatient Medications  Medication Sig Dispense Refill   acetaminophen (TYLENOL) 500 MG tablet Take 500-1,000 mg by mouth every 6 (six) hours as needed for mild pain.     allopurinol (ZYLOPRIM) 300 MG tablet TAKE 1 TABLET BY MOUTH EVERY DAY 90 tablet 0   atorvastatin (LIPITOR) 10 MG tablet Take 1 tablet (10 mg total) by mouth daily at 6 PM. 90 tablet 3   esomeprazole (NEXIUM) 40 MG capsule Take 1 capsule (40 mg total) by mouth daily. (Patient taking differently: Take 20 mg by mouth daily. ) 30 capsule 0   meloxicam (MOBIC) 7.5 MG tablet Take 15 mg by mouth daily as needed for pain.     metoprolol succinate (TOPROL-XL) 25 MG 24 hr tablet Take 1 tablet (25 mg total) by mouth daily. Take with or immediately following a meal. 90 tablet 2   Multiple Vitamin (MULTIVITAMIN WITH MINERALS) TABS tablet Take 1 tablet by mouth daily.     oxyCODONE (OXY  IR/ROXICODONE) 5 MG immediate release tablet Take 1 tablet by mouth every 6 (six) hours as needed for severe pain.      traMADol (ULTRAM) 50 MG tablet Take 50 mg by mouth 3 (three) times  daily as needed for moderate pain.      Current Facility-Administered Medications  Medication Dose Route Frequency Provider Last Rate Last Dose   sodium chloride flush (NS) 0.9 % injection 3 mL  3 mL Intravenous Q12H Josue Hector, MD          Review of Systems:     Cardiac Review of Systems: [Y] = yes  or   [ N ] = no   Chest Pain [ n   ]  Resting SOB [ n  ] Exertional SOB  [ n ]  Orthopnea [ n ]   Pedal Edema [ n  ]    Palpitations [ n ] Syncope  [ n ]   Presyncope [  n ]   General Review of Systems: [Y] = yes [  ]=no Constitional: recent weight change [  ];  Wt loss over the last 3 months [   ] anorexia [  ]; fatigue [  ]; nausea [  ]; night sweats [  ]; fever [  ]; or chills [  ];           Eye : blurred vision [  ]; diplopia [   ]; vision changes [  ];  Amaurosis fugax[  ]; Resp: cough [  ];  wheezing[  ];  hemoptysis[  ]; shortness of breath[  ]; paroxysmal nocturnal dyspnea[  ]; dyspnea on exertion[  ]; or orthopnea[  ];  GI:  gallstones[  ], vomiting[  ];  dysphagia[  ]; melena[  ];  hematochezia [  ]; heartburn[  ];   Hx of  Colonoscopy[  ]; GU: kidney stones [  ]; hematuria[  ];   dysuria [  ];  nocturia[  ];  history of     obstruction [  ]; urinary frequency [  ]             Skin: rash, swelling[  ];, hair loss[  ];  peripheral edema[  ];  or itching[  ]; Musculosketetal: myalgias[  ];  joint swelling[  ];  joint erythema[  ];  joint pain[  ];  back pain[  ];  Heme/Lymph: bruising[  ];  bleeding[  ];  anemia[  ];  Neuro: TIA[  ];  headaches[  ];  stroke[  ];  vertigo[  ];  seizures[  ];   paresthesias[  ];  difficulty walking[ recovering from quad surgery  ];  Psych:depression[  ]; anxiety[  ];  Endocrine: diabetes[  ];  thyroid dysfunction[  ];  Immunizations: Flu up to date [ y ];  Pneumococcal up to date Blue.Reese  ];  Other:     PHYSICAL EXAMINATION: BP (!) 157/108 (BP Location: Right Arm, Patient Position: Sitting)    Pulse 77    Temp (!) 97.5 F (36.4 C) (Skin)    Resp 16    Ht 6' (1.829 m)    Wt 91.6 kg    SpO2 99% Comment: RA   BMI 27.40 kg/m  General appearance: alert, cooperative and no distress Head: Normocephalic, without obvious abnormality, atraumatic Neck: no adenopathy, no carotid bruit, no JVD, supple, symmetrical, trachea midline and thyroid not enlarged, symmetric, no tenderness/mass/nodules Lymph nodes: Cervical, supraclavicular, and axillary nodes normal. Resp: clear to auscultation bilaterally Back: symmetric, no curvature. ROM normal. No CVA tenderness. Cardio: regular rate and rhythm, S1, S2 normal, early to mid 3/6 systolic murmur heard best along the left sternal border, click, rub or gallop GI: soft,  non-tender; bowel sounds normal; no masses,  no organomegaly Extremities: extremities normal, atraumatic, no cyanosis or edema and Homans sign is negative, no sign of DVT Neurologic: Grossly normal  Diagnostic Studies & Laboratory data:     Recent Radiology Findings:   Ct Coronary Morph W/cta Cor W/score W/ca W/cm &/or Wo/cm  Addendum Date: 11/03/2019   ADDENDUM REPORT: 11/03/2019 13:33 EXAM: OVER-READ INTERPRETATION  CT CHEST The following report is an over-read performed by radiologist Dr. Samara Snide [facility] on 11/03/2019. This over-read does not include interpretation of cardiac or coronary anatomy or pathology. The CTA interpretation by the cardiologist is attached. COMPARISON:  09/12/2018 chest CT angiogram. FINDINGS: Please see the separate concurrent chest CT angiogram report for details. IMPRESSION: Please see the separate concurrent chest CT angiogram report for details. Electronically Signed   By: Ilona Sorrel M.D.   On: 11/03/2019 13:33   Result Date: 11/03/2019 CLINICAL DATA:  Aortic Stenosis EXAM: Cardiac TAVR CT  MEDICATIONS: None TECHNIQUE: The patient was scanned on a Siemens Force [id number] slice scanner. A 120 kV retrospective scan was triggered in the ascending thoracic aorta at 140 HU's. Gantry rotation speed was 250 msecs and collimation was .6 mm. No beta blockade or nitro were given. The 3D data set was reconstructed in 5% intervals of the R-R cycle. Systolic and diastolic phases were analyzed on a dedicated work station using MPR, MIP and VRT modes. The patient received 80 cc of contrast. The patient was scanned on a Siemens Force [id number] slice scanner. Gantry rotation speed was 250 msecs. Collimation was .6 mm. A 100 kV prospective scan was triggered in the ascending thoracic aorta at 140 HU's Full mA was used between 35% and 75% of the R-R interval. Average HR during the scan was 47 bpm. The 3D data set was interpreted on a dedicated work station using MPR, MIP and VRT modes. A total of 80 cc of contrast was used. FINDINGS: Aortic Valve: Bicuspid and calcified with restricted motion. Appears to be large right and left cusps with no raphe Aorta: Mild to moderate fusiform dilatation normal arch vessels no coarctation Sino-tubular Junction: 34 mm Ascending Thoracic Aorta: 43 mm Aortic Arch: 36 mm Descending Thoracic Aorta: 24 mm Sinus of Valsalva Measurements: Bicuspid valve with right cusp including RCA ostia and left including LM ostia Long axis diameter 44 mm minor axis dimension 30 mm Coronary Artery Height above Annulus: Left Main: 16.7 mm above annulus Right Coronary: 16.4 mm above annulus Virtual Basal Annulus Measurements: Maximum / Minimum Diameter: 26.6 mm x 23.5 mm Perimeter:  -- Area: 479 mm 2 Coronary Arteries: Sufficient height above annulus for deployment Optimum Fluoroscopic Angle for Delivery: LAO 16 Cranial 3 degrees IMPRESSION: 1. Bicuspid AV with annular area of 479 mm 2 suitable for a 26 mm Sapien 3 valve 2. Fusiform dilatation of the ascending aortic root at 4.3 cm In setting of bicuspid AV this will  have to be taken into consideration regarding choice of SAVR vs TAVR 3.  Coronary arteries sufficient height above annulus for deployment 4. Optimum angiographic angle for deployment LAO 16 Cranial 3 degrees [name] Electronically Signed: By: [name] M.D. On: 11/03/2019 12:32    Result Date: 11/03/2019 CLINICAL DATA:  Severe symptomatic aortic stenosis. Pre-TAVR evaluation. EXAM: CT ANGIOGRAPHY CHEST, ABDOMEN AND PELVIS TECHNIQUE: Multidetector CT imaging through the chest, abdomen and pelvis was performed using the standard protocol during bolus administration of intravenous contrast. Multiplanar reconstructed images and MIPs were obtained and reviewed  to evaluate the vascular anatomy. CONTRAST:  177mL OMNIPAQUE IOHEXOL 350 MG/ML SOLN COMPARISON:  09/12/2018 chest CT angiogram. 09/19/2018 CT abdomen/pelvis. FINDINGS: CTA CHEST FINDINGS Cardiovascular: Mild cardiomegaly. Diffuse thickening and coarse calcification of the aortic valve. No significant pericardial effusion/thickening. Atherosclerotic thoracic aorta with stable 4.5 cm ascending thoracic aortic aneurysm. Normal caliber pulmonary arteries. No central pulmonary emboli. Mediastinum/Nodes: No discrete thyroid nodules. Unremarkable esophagus. No pathologically enlarged axillary, mediastinal or hilar lymph nodes. Lungs/Pleura: No pneumothorax. No pleural effusion. No acute consolidative airspace disease, lung masses or significant pulmonary nodules. Musculoskeletal: No aggressive appearing focal osseous lesions. Moderate thoracic spondylosis. CTA ABDOMEN AND PELVIS FINDINGS Hepatobiliary: Normal liver with no liver mass. Normal gallbladder with no radiopaque cholelithiasis. No biliary ductal dilatation. Pancreas: Normal, with no mass or duct dilation. Spleen: Normal size. No mass. Adrenals/Urinary Tract: Normal adrenals. No hydronephrosis. Scattered subcentimeter hypodense renal cortical lesions in the left kidney are too small to characterize  and are unchanged, requiring no follow-up. No new contour deforming renal lesions. Normal bladder. Stomach/Bowel: Normal non-distended stomach. Normal caliber small bowel with no small bowel wall thickening. Normal appendix. Moderate sigmoid diverticulosis, with no large bowel wall thickening or significant pericolonic fat stranding. Vascular/Lymphatic: Atherosclerotic nonaneurysmal abdominal aorta. No pathologically enlarged lymph nodes in the abdomen or pelvis. Reproductive: Mildly enlarged prostate. Other: No pneumoperitoneum, ascites or focal fluid collection. Musculoskeletal: No aggressive appearing focal osseous lesions. Severe degenerative changes throughout the mid to lower lumbar spine. VASCULAR MEASUREMENTS PERTINENT TO TAVR: AORTA: Minimal Aortic Diameter-13.1 x 13.0 mm Severity of Aortic Calcification-moderate RIGHT PELVIS: Right Common Iliac Artery - Minimal Diameter-8.7 x 8.6 mm Tortuosity-severe Calcification-mild Right External Iliac Artery - Minimal Diameter-8.0 x 7.7 mm Tortuosity-mild-to-moderate Calcification-none Right Common Femoral Artery - Minimal Diameter-7.4 x 7.4 mm Tortuosity-none Calcification-none LEFT PELVIS: Left Common Iliac Artery - Minimal Diameter-8.7 x 6.6 mm Tortuosity-moderate Calcification-moderate Left External Iliac Artery - Minimal Diameter-7.4 x 7.3 mm Tortuosity-mild Calcification-none Left Common Femoral Artery - Minimal Diameter-7.3 x 6.3 mm Tortuosity-mild Calcification-mild-to-moderate Review of the MIP images confirms the above findings. IMPRESSION: 1. Vascular findings and measurements pertinent to potential TAVR procedure, as detailed. 2. Diffuse thickening and calcification of the aortic valve, compatible with the reported history of severe symptomatic aortic stenosis. 3. Mild cardiomegaly. 4. Stable 4.5 cm ascending thoracic aortic aneurysm. Ascending thoracic aortic aneurysm. Recommend semi-annual imaging followup by CTA or MRA and referral to cardiothoracic  surgery if not already obtained. This recommendation follows 2010 ACCF/AHA/AATS/ACR/ASA/SCA/SCAI/SIR/STS/SVM Guidelines for the Diagnosis and Management of Patients With Thoracic Aortic Disease. Circulation. 2010; [id number]. Aortic aneurysm NOS (ICD10-I71.9). 5. Moderate sigmoid diverticulosis. 6. Mild prostatomegaly. 7.  Aortic Atherosclerosis (ICD10-I70.0). Electronically Signed   By: Ilona Sorrel M.D.   On: 11/03/2019 13:32     I have independently reviewed the above radiology studies  and reviewed the findings with the patient.   CATH:10/08/2019  Left Main  The vessel exhibits minimal luminal irregularities.  Left Anterior Descending  Mid LAD lesion 60% stenosed  Mid LAD lesion is 60% stenosed. The lesion is eccentric.  First Diagonal Branch  1st Diag-1 lesion 75% stenosed  1st Diag-1 lesion is 75% stenosed.  1st Diag-2 lesion 75% stenosed  1st Diag-2 lesion is 75% stenosed.  Second Diagonal Branch  2nd Diag lesion 50% stenosed  2nd Diag lesion is 50% stenosed.  Left Circumflex  The vessel exhibits minimal luminal irregularities.  Right Coronary Artery  Prox RCA to Mid RCA lesion 35% stenosed  Prox RCA to Mid RCA lesion is 35% stenosed  AO Systolic Pressure [id number] mmHg  AO Diastolic Pressure 70 mmHg  AO Mean 88 mmHg  LV Systolic Pressure 0000000 mmHg  LV Diastolic Pressure 7 mmHg  LV EDP 15 mmHg  AOp Systolic Pressure [id number] mmHg  AOp Diastolic Pressure 64 mmHg  AOp Mean Pressure 83 mmHg  LVp Systolic Pressure 0000000 mmHg  LVp Diastolic Pressure 2 mmHg  LVp EDP Pressure 29 mmHg   1. Left ventricular ejection fraction, by visual estimation, is 55 to 60%. The left ventricle has normal function. Normal left ventricular size. Left ventricular septal wall thickness was mildly increased. There is mildly increased left ventricular hypertrophy. 2. Left ventricular diastolic Doppler parameters are consistent with impaired relaxation pattern of LV diastolic filling. 3. Global right  ventricle has normal systolic function.The right ventricular size is normal. No increase in right ventricular wall thickness. 4. Left atrial size was mildly dilated. 5. Right atrial size was normal. 6. Mild mitral annular calcification. 7. The mitral valve is normal in structure. Mild mitral valve regurgitation. 8. The tricuspid valve is normal in structure. Tricuspid valve regurgitation is mild. 9. The aortic valve is bicuspid Aortic valve regurgitation was not visualized by color flow Doppler. Severe aortic valve stenosis. 10. Bicuspid AV with calcification and restricted motion Sinnce last echo AS now severe. 11. The pulmonic valve was grossly normal. Pulmonic valve regurgitation is mild by color flow Doppler. 12. Aortic dilatation noted. 13. There is moderate dilatation of the ascending aorta measuring 44 mm. 14. Mildly elevated pulmonary artery systolic pressure. In comparison to the previous echocardiogram(s): 02/28/19 EF 55-60%. Ascending aorta dimension 58mm. Moderate AS 52mmHg peak, 40mmHg mean. Left Ventricle: Left ventricular ejection fraction, by visual estimation, is 55 to 60%.  Recent Lab Findings: Lab Results  Component Value Date   WBC 6.3 09/26/2019   HGB 13.2 09/26/2019   HCT 39.1 09/26/2019   PLT 207 09/26/2019   GLUCOSE 93 09/26/2019   CHOL 165 05/06/2019   TRIG 135 05/06/2019   HDL 63 05/06/2019   LDLDIRECT 164.4 12/25/2007   LDLCALC 75 05/06/2019   ALT 40 05/06/2019   AST 29 05/06/2019   NA 140 09/26/2019   K 4.7 09/26/2019   CL 103 09/26/2019   CREATININE 1.04 09/26/2019   BUN 22 09/26/2019   CO2 24 09/26/2019   HGBA1C 5.6 11/21/2018      Assessment / Plan:   #1 bicuspid aortic valve with progressive echo evidence of stenosis over the past 2 years, the patient denies symptoms of angina or congestive heart failure.  This is qualified by recovering over the summer from redo quadriceps surgery on the left.  He notes that this has decreased his activity  level to some degree.  #2 mild coronary occlusive disease-recent cardiac catheterization October 2020 moderate eccentric LAD disease 50 to 60% #3 stable mild dilatation of the ascending aorta over scans since 2016 at approximately 4.3 cm   I reviewed reviewed with the patient the progressive nature of his aortic stenosis confirmed by serial echocardiograms to the point now he has criteria for severe aortic stenosis.  He denies symptoms but notes he has decreased his activities some as he is recovered from recent quadriceps surgery.  With the progressive nature over a relatively short time of his aortic stenosis I have suggested to him that the severe nature of his aortic stenosis will require intervention on his aortic valve, either TAVR-if we consider the degree of aortic dilatation acceptable and to be left untreated versus formal open aortic valve  replacement, with a tissue valve and replacement of the ascending aorta.  The patient questioned if he could put this off for a year until he fully recovered from his quadriceps surgery, I have discouraged this.  I think it would be most appropriate to consider in the next 3 to 4 months proceeding with some intervention.  Risks and expectations of both surgical open treatment and TAVR were discussed with the patient.  He would like to consider his options and is willing to return after Thanksgiving to discuss further.      I  spent 30 minutes with  the patient face to face and greater then 50% of the time was spent in counseling and coordination of care.    Grace Isaac MD      [address] [place] [address] Office [phone]     11/05/2019 12:42 PM

## 2019-11-07 ENCOUNTER — Telehealth: Payer: Self-pay

## 2019-11-07 DIAGNOSIS — E785 Hyperlipidemia, unspecified: Secondary | ICD-10-CM

## 2019-11-07 MED ORDER — ATORVASTATIN CALCIUM 20 MG PO TABS: 20.0000 mg | ORAL_TABLET | Freq: Every day | ORAL | 3 refills | Status: DC

## 2019-11-07 NOTE — Telephone Encounter (Signed)
-----   Message from Josue Hector, MD sent at 11/05/2019  4:39 PM EST ----- LDL slightly above goal 70 increase lipitor to 20 mg daily and repeat labs in 3 months

## 2019-11-07 NOTE — Telephone Encounter (Signed)
Patient aware of results. Per Dr. Johnsie Cancel, LDL slightly above goal 70 increase lipitor to 20 mg daily and repeat labs in 3 months. Patient verbalized understanding and will come in on 02/08/19 for fasting lipid and liver panel.

## 2019-11-11 NOTE — Telephone Encounter (Signed)
Left message for patient to call back  

## 2019-11-14 NOTE — Progress Notes (Signed)
Virtual Visit via Video Note   This visit type was conducted due to national recommendations for restrictions regarding the COVID-19 Pandemic (e.g. social distancing) in an effort to limit this patient's exposure and mitigate transmission in our community.  Due to his co-morbid illnesses, this patient is at least at moderate risk for complications without adequate follow up.  This format is felt to be most appropriate for this patient at this time.  All issues noted in this document were discussed and addressed.  A limited physical exam was performed with this format.  Please refer to the patient's chart for his consent to telehealth for [facility].   Date:  11/19/2019   ID:  Jose [name], DOB December 06, 1951, MRN [id number]  Patient Location: Home Provider Location: Office  PCP:  [name], MD  Cardiologist:   Johnsie Cancel Electrophysiologist:  None   Evaluation Performed:  Follow-Up Visit  Chief Complaint:  Severe Aortic Stenosis   History of Present Illness:    Jose [name] a 68 y.o.malewho presents for f/u vascular disease and bicuspid AV with severe  AS Followed by [name] for ascending aortic dilatations. Reviewed CTA 6/2/20and measured stable 4.3 cm The patient has a history of intracranial bleed/ subdural hematoma without history of trauma treated in [place]. He underwent right craniotomy. 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 With stable 4.3 cm aortic root may be candidate for TAVR  He is low risk and would be fine for SAVR and tissue AVR. Traditionally for bicuspid AV would have bental for changing aorta or root 4.5 cm or above. He seems inclined not to have open surgery Has some CAD but not bad enough to need CABG.   Seen by [name] 11/05/19 Patient undecided [name] felt some intervention needed in next 3-4 months   TARVR CT 11/03/19 bicuspid AV suitable for 26 mm Sapien 3 valve Aortic root 4.3 stable  Root dimensions have been stable since CT done 2016 Reviewed his cath and suspect he could have LIMA  To LAD and SVG to D1  Long discussion with [facility] Psychologically is is not ready and does not want another big surgery. Discussed small risk of aneurysm growing and not being a surgical candidate in 5 plus years Also discussed ability to Rx his CAD with stents   The patient  does not have symptoms concerning for COVID-19 infection (fever, chills, cough, or new shortness of breath).    Past Medical History:  Diagnosis Date   Arthritis    R shoulder, great toes, hands- has gout & has injections in knees with Dr. Estanislado Pandy    Cancer [facility])    skin ca-    GERD (gastroesophageal reflux disease)    History of hiatal hernia    SDH (subdural hematoma) [facility])    Past Surgical History:  Procedure Laterality Date   BRAIN SURGERY  2016   evacuation of SDH  CARPAL TUNNEL RELEASE Bilateral    GREEN LIGHT LASER TURP (TRANSURETHRAL RESECTION OF PROSTATE  04/27/2017   KNEE ARTHROPLASTY     LEFT HEART CATH AND CORONARY ANGIOGRAPHY N/A 10/08/2019   Procedure: LEFT HEART CATH AND CORONARY ANGIOGRAPHY;  Surgeon: Sherren Mocha, MD;  Location: [facility];  Service: Cardiovascular;  Laterality: N/A;   QUADRICEPS TENDON REPAIR Bilateral 06/15/2017   Procedure: REPAIR QUADRICEP TENDON;  Surgeon: Meredith Pel, MD;  Location: [place];  Service: Orthopedics;  Laterality: Bilateral;     SHOULDER SURGERY Right      Current Meds  Medication Sig   acetaminophen (TYLENOL) 500 MG tablet Take 500-1,000 mg by mouth every 6 (six) hours as needed for mild pain.   allopurinol (ZYLOPRIM) 300 MG tablet TAKE 1 TABLET BY MOUTH EVERY DAY   atorvastatin (LIPITOR) 20 MG tablet Take 1 tablet (20 mg total) by mouth daily at 6 PM.   esomeprazole (NEXIUM) 40 MG capsule Take 1 capsule (40 mg total) by mouth daily. (Patient taking differently: Take 20 mg by mouth daily. )   Multiple Vitamin (MULTIVITAMIN WITH MINERALS) TABS tablet Take 1 tablet by mouth daily.   Current Facility-Administered Medications for the 11/19/19 encounter (Telemedicine) with Josue Hector, MD  Medication   sodium chloride flush (NS) 0.9 % injection 3 mL     Allergies:   Ciprofloxacin and Quinolones   Social History   Tobacco Use   Smoking status: Never Smoker   Smokeless tobacco: Never Used  Substance Use Topics   Alcohol use: Yes    Alcohol/week: 8.0 standard drinks    Types: 7 Glasses of wine, 1 Shots of liquor per week   Drug use: No     Family Hx: The patient's family history includes Diabetes in his brother; Heart disease in his father; Parkinson's disease in his brother and mother.  ROS:   Please see the history of present illness.     All other systems reviewed and are negative.   Prior CV studies:   The following studies were reviewed today:  Cath 10/08/19 Echo 210/9/20 Cardiac TAVR CT 11/03/19   Labs/Other Tests and Data Reviewed:    EKG:  SR rate 63 low voltage 09/26/19   Recent Labs: 09/26/2019: BUN 22; Creatinine, Ser 1.04; Hemoglobin 13.2; Platelets 207; Potassium 4.7; Sodium 140 11/05/2019: ALT 18   Recent Lipid Panel Lab Results  Component Value Date/Time   CHOL 169 11/05/2019 10:26 AM   TRIG 56 11/05/2019 10:26 AM   HDL 76 11/05/2019 10:26 AM   CHOLHDL 2.2 11/05/2019 10:26 AM   CHOLHDL 3.7 CALC 12/25/2007 04:36 PM   LDLCALC 82 11/05/2019 10:26 AM    LDLDIRECT 164.4 12/25/2007 04:36 PM    Wt Readings from Last 3 Encounters:  11/19/19 194 lb (88 kg)  11/05/19 202 lb (91.6 kg)  11/03/19 198 lb (89.8 kg)     Objective:    Vital Signs:  Ht 6' (1.829 m)    Wt 194 lb (88 kg)    BMI 26.31 kg/m    Skin warm and dry No distress No tachypnea No JVP elevation  Neuro appears non focal No edema  ASSESSMENT & PLAN:    Bicuspid AV: with progressive mean gradient 09/26/19 45 mmHg peak 69 mmHg CAth 10/08/19 Dr Burt Knack with moderate eccentric LAD disease and small D1 not suitable for PCI. Aortic root is stable at 4.3 cm  Favoring TAVR surgery Meeting with [name] latter today If he wants to do TAVR  will contact team for 2 nd surgical opinion   Aneurysm: followed by [name] 4.3 cm on cardiac CT June 2020 and unchanged on TAVR CT from today 11/16//20 Stable since 2016   HTN:  Well controlled.  Continue current medications and low sodium Dash type diet.    HLD:  Continue statin target LDL less than 70 with aneurysm f/u labs ordered this week LDL 75   Gout: continue allopurinol no attacks   Ortho:  Post quad surgery July [place] improved   COVID-19 Education: The signs and symptoms of COVID-19 were discussed with the patient and how to seek care for testing (follow up with PCP or arrange E-visit).  The importance of social distancing was discussed today.  Time:   Today, I have spent 30 minutes with the patient with telehealth technology discussing the above problems.     Medication Adjustments/Labs and Tests Ordered: Current medicines are reviewed at length with the patient today.  Concerns regarding medicines are outlined above.   Tests Ordered:  None   Medication Changes:  None   Disposition:  Follow up in 3 months   Signed, [name], MD  11/19/2019 9:56 AM    [place]

## 2019-11-17 NOTE — Telephone Encounter (Signed)
Called patient again, and he refers to talk to Dr. Johnsie Cancel before his appointment with Dr. Servando Snare. Patient will have virtual visit on Wednesday.

## 2019-11-19 ENCOUNTER — Other Ambulatory Visit: Payer: Self-pay

## 2019-11-19 ENCOUNTER — Telehealth (INDEPENDENT_AMBULATORY_CARE_PROVIDER_SITE_OTHER): Payer: Medicare Other | Admitting: Cardiovascular Disease

## 2019-11-19 ENCOUNTER — Encounter: Payer: Self-pay | Admitting: Cardiovascular Disease

## 2019-11-19 VITALS — Ht 72.0 in | Wt 194.0 lb

## 2019-11-19 DIAGNOSIS — Q230 Congenital stenosis of aortic valve: Secondary | ICD-10-CM | POA: Diagnosis not present

## 2019-11-19 DIAGNOSIS — Q231 Congenital insufficiency of aortic valve: Secondary | ICD-10-CM | POA: Diagnosis not present

## 2019-11-19 NOTE — Patient Instructions (Addendum)
Medication Instructions:   *If you need a refill on your cardiac medications before your next appointment, please call your pharmacy*  Lab Work:  If you have labs (blood work) drawn today and your tests are completely normal, you will receive your results only by: Marland Kitchen MyChart Message (if you have MyChart) OR . A paper copy in the mail If you have any lab test that is abnormal or we need to change your treatment, we will call you to review the results.  Testing/Procedures: None ordered today.  Follow-Up: To be determine after visit with Dr. Servando Snare.

## 2019-11-19 NOTE — Progress Notes (Signed)
[address]       [place] [address]             [phone]                    Yussuf A Gorczyca Grandin Medical Record [id number] Date of Birth: 1951-10-08  Referring: Sherren Mocha, MD Primary Care: [name], MD Primary Cardiologist: [name], MD  Chief Complaint:    Chief Complaint  Patient presents with   Aortic Stenosis    Further discuss surgery   Thoracic Aortic Aneurysm    History of Present Illness:    Raghav A Tholl 68 y.o. male is seen in the office  today for discussion of treatment options for his progressive aortic stenosis.  He was first seen in the office 07/06/2015  referred by the emergency room at [facility]  because of a dilated ascending aorta. The patient has no previous history of cardiac disease, no history of known coronary artery disease or valvular disease.  The patient has a history of intracranial bleed/ subdural hematoma without history of trauma treated in [place].   He underwent right craniotomy.  In July 2016 he was having increasing cough discussed this with his neurologist who recommended that he be seen and have a CAT scan of the head done. While in the emergency room a CT scan of the chest was also done., Demonstrating mild dilatation of the ascending aorta. The patient is a nonsmoker.  The patient denies any signs or symptoms of congestive heart failure, denies angina, denies syncope or presyncope.  He has had to limit his activities to some degree as he used to recover from redo quadriceps surgery on the left.  Last year he had bilateral quadriceps repairs.  Serial echocardiograms over the past several years have demonstrated progressive aortic stenosis.     Mean  Peak  AV Vmax  EF 09/2019  45  68  414 cm/sec  55-60  02/2019  31  59  385   50-55 02/2018   30  53  365   50-55 02/2017   22  79  277   50-55   History family history is significant for his father who died in his late 3s with myocardial infarction,  after having his first myocardial infarction in his 84s. There is no family history of aortic aneurysms, unexplained sudden death at an early age or aortic dissection.He has one cousin who died in his 62s of sudden collapse thought to be a brain aneurysm  The patient is a cardiac catheterization by Dr. Burt Knack and is followed by Dr. Frances Nickels.  TAVR protocol cardiac CTs were done and reviewed with Dr. Frances Nickels.  See below  Current Activity/ Functional Status:  Patient is independent with mobility/ambulation, transfers, ADL's, IADL's.   Zubrod Score: At the time of surgery this patients most appropriate activity status/level should be described as: []     0    Normal activity, no symptoms [x]     1    Restricted in physical strenuous activity but ambulatory, able to do out light work []     2    Ambulatory and capable of self care, unable to do work activities, up and about               >50 % of waking hours                              []   3    Only limited self care, in bed greater than 50% of waking hours []     4    Completely disabled, no self care, confined to bed or chair []     5    Moribund   Past Medical History:  Diagnosis Date   Arthritis    R shoulder, great toes, hands- has gout & has injections in knees with Dr. Estanislado Pandy    Cancer [facility])    skin ca-    GERD (gastroesophageal reflux disease)    History of hiatal hernia    SDH (subdural hematoma) ([place])     Past Surgical History:  Procedure Laterality Date   BRAIN SURGERY  2016   evacuation of SDH   CARPAL TUNNEL RELEASE Bilateral    GREEN LIGHT LASER TURP (TRANSURETHRAL RESECTION OF PROSTATE  04/27/2017   KNEE ARTHROPLASTY     LEFT HEART CATH AND CORONARY ANGIOGRAPHY N/A 10/08/2019   Procedure: LEFT HEART CATH AND CORONARY ANGIOGRAPHY;  Surgeon: Sherren Mocha, MD;  Location: [place] [facility];  Service: Cardiovascular;  Laterality: N/A;   QUADRICEPS TENDON REPAIR Bilateral 06/15/2017   Procedure: REPAIR  QUADRICEP TENDON;  Surgeon: Meredith Pel, MD;  Location: [place];  Service: Orthopedics;  Laterality: Bilateral;   SHOULDER SURGERY Right     Family History  Problem Relation Age of Onset   Parkinson's disease Mother    Heart disease Father    Parkinson's disease Brother    Diabetes Brother      Social History   Tobacco Use  Smoking Status Never Smoker  Smokeless Tobacco Never Used    Social History   Substance and Sexual Activity  Alcohol Use Yes   Alcohol/week: 8.0 standard drinks   Types: 7 Glasses of wine, 1 Shots of liquor per week     Allergies  Allergen Reactions   Ciprofloxacin Other (See Comments)    Patient states instructed not to take due to history of Aneurysms   Quinolones     Patient was warned about not using Cipro and similar antibiotics. Recent studies have raised concern that fluoroquinolone antibiotics could be associated with an increased risk of aortic aneurysm Fluoroquinolones have non-antimicrobial properties that might jeopardise the integrity of the extracellular matrix of the vascular wall In a  propensity score matched cohort study in [place], there was a 66% increased rate of aortic aneurysm or dissection associated with oral fluoroquinolone use, compared wit    Current Outpatient Medications  Medication Sig Dispense Refill   acetaminophen (TYLENOL) 500 MG tablet Take 500-1,000 mg by mouth every 6 (six) hours as needed for mild pain.     allopurinol (ZYLOPRIM) 300 MG tablet TAKE 1 TABLET BY MOUTH EVERY DAY 90 tablet 0   atorvastatin (LIPITOR) 20 MG tablet Take 1 tablet (20 mg total) by mouth daily at 6 PM. 90 tablet 3   esomeprazole (NEXIUM) 40 MG capsule Take 1 capsule (40 mg total) by mouth daily. (Patient taking differently: Take 20 mg by mouth daily. ) 30 capsule 0   metoprolol succinate (TOPROL-XL) 25 MG 24 hr tablet Take 25 mg by mouth daily.     Multiple Vitamin (MULTIVITAMIN WITH MINERALS) TABS tablet Take 1 tablet  by mouth daily.     Current Facility-Administered Medications  Medication Dose Route Frequency Provider Last Rate Last Dose   sodium chloride flush (NS) 0.9 % injection 3 mL  3 mL Intravenous Q12H Josue Hector, MD  Review of Systems:     Cardiac Review of Systems: [Y] = yes  or   [ N ] = no   Chest Pain [ n   ]  Resting SOB [ n  ] Exertional SOB  [ n ]  Orthopnea [ n ]   Pedal Edema [ n  ]    Palpitations [ n ] Syncope  [ n ]   Presyncope [  n ]   General Review of Systems: [Y] = yes [  ]=no Constitional: recent weight change [  ];  Wt loss over the last 3 months [   ] anorexia [  ]; fatigue [  ]; nausea [  ]; night sweats [  ]; fever [  ]; or chills [  ];           Eye : blurred vision [  ]; diplopia [   ]; vision changes [  ];  Amaurosis fugax[  ]; Resp: cough [  ];  wheezing[  ];  hemoptysis[  ]; shortness of breath[  ]; paroxysmal nocturnal dyspnea[  ]; dyspnea on exertion[  ]; or orthopnea[  ];  GI:  gallstones[  ], vomiting[  ];  dysphagia[  ]; melena[  ];  hematochezia [  ]; heartburn[  ];   Hx of  Colonoscopy[  ]; GU: kidney stones [  ]; hematuria[  ];   dysuria [  ];  nocturia[  ];  history of     obstruction [  ]; urinary frequency [  ]             Skin: rash, swelling[  ];, hair loss[  ];  peripheral edema[  ];  or itching[  ]; Musculosketetal: myalgias[  ];  joint swelling[  ];  joint erythema[  ];  joint pain[  ];  back pain[  ];  Heme/Lymph: bruising[  ];  bleeding[  ];  anemia[  ];  Neuro: TIA[  ];  headaches[  ];  stroke[  ];  vertigo[  ];  seizures[  ];   paresthesias[  ];  difficulty walking[ recovering from quad surgery  ];  Psych:depression[  ]; anxiety[  ];  Endocrine: diabetes[  ];  thyroid dysfunction[  ];  Immunizations: Flu up to date [ y ]; Pneumococcal up to date Blue.Reese  ];  Other:     PHYSICAL EXAMINATION: BP 128/89 (BP Location: Right Arm)    Pulse 72    Temp 97.7 F (36.5 C) (Skin)    Resp 20    Ht 6' (1.829 m)    Wt 92.1 kg    SpO2 95%  Comment: RA   BMI 27.53 kg/m  General appearance: alert, cooperative and no distress Head: Normocephalic, without obvious abnormality, atraumatic Neck: no adenopathy, no carotid bruit, no JVD, supple, symmetrical, trachea midline and thyroid not enlarged, symmetric, no tenderness/mass/nodules Lymph nodes: Cervical, supraclavicular, and axillary nodes normal. Resp: clear to auscultation bilaterally Back: symmetric, no curvature. ROM normal. No CVA tenderness. Cardio: regular rate and rhythm, S1, S2 normal, early to mid 3/6 systolic murmur heard best along the left sternal border, click, rub or gallop GI: soft, non-tender; bowel sounds normal; no masses,  no organomegaly Extremities: extremities normal, atraumatic, no cyanosis or edema and Homans sign is negative, no sign of DVT Neurologic: Grossly normal  Diagnostic Studies & Laboratory data:     Recent Radiology Findings:   Ct Coronary Morph W/cta Cor W/score W/ca W/cm &/or Wo/cm  Addendum Date: 11/03/2019  ADDENDUM REPORT: 11/03/2019 13:33 EXAM: OVER-READ INTERPRETATION  CT CHEST The following report is an over-read performed by radiologist Dr. Samara Snide [facility], [place] on 11/03/2019. This over-read does not include interpretation of cardiac or coronary anatomy or pathology. The CTA interpretation by the cardiologist is attached. COMPARISON:  09/12/2018 chest CT angiogram. FINDINGS: Please see the separate concurrent chest CT angiogram report for details. IMPRESSION: Please see the separate concurrent chest CT angiogram report for details. Electronically Signed   By: Ilona Sorrel M.D.   On: 11/03/2019 13:33   Result Date: 11/03/2019 CLINICAL DATA:  Aortic Stenosis EXAM: Cardiac TAVR CT MEDICATIONS: None TECHNIQUE: The patient was scanned on a Siemens Force [id number] slice scanner. A 120 kV retrospective scan was triggered in the ascending thoracic aorta at 140 HU's. Gantry rotation speed was 250 msecs and collimation was .6 mm. No beta  blockade or nitro were given. The 3D data set was reconstructed in 5% intervals of the R-R cycle. Systolic and diastolic phases were analyzed on a dedicated work station using MPR, MIP and VRT modes. The patient received 80 cc of contrast. The patient was scanned on a Siemens Force [id number] slice scanner. Gantry rotation speed was 250 msecs. Collimation was .6 mm. A 100 kV prospective scan was triggered in the ascending thoracic aorta at 140 HU's Full mA was used between 35% and 75% of the R-R interval. Average HR during the scan was 47 bpm. The 3D data set was interpreted on a dedicated work station using MPR, MIP and VRT modes. A total of 80 cc of contrast was used. FINDINGS: Aortic Valve: Bicuspid and calcified with restricted motion. Appears to be large right and left cusps with no raphe Aorta: Mild to moderate fusiform dilatation normal arch vessels no coarctation Sino-tubular Junction: 34 mm Ascending Thoracic Aorta: 43 mm Aortic Arch: 36 mm Descending Thoracic Aorta: 24 mm Sinus of Valsalva Measurements: Bicuspid valve with right cusp including RCA ostia and left including LM ostia Long axis diameter 44 mm minor axis dimension 30 mm Coronary Artery Height above Annulus: Left Main: 16.7 mm above annulus Right Coronary: 16.4 mm above annulus Virtual Basal Annulus Measurements: Maximum / Minimum Diameter: 26.6 mm x 23.5 mm Perimeter:  -- Area: 479 mm 2 Coronary Arteries: Sufficient height above annulus for deployment Optimum Fluoroscopic Angle for Delivery: LAO 16 Cranial 3 degrees IMPRESSION: 1. Bicuspid AV with annular area of 479 mm 2 suitable for a 26 mm Sapien 3 valve 2. Fusiform dilatation of the ascending aortic root at 4.3 cm In setting of bicuspid AV this will have to be taken into consideration regarding choice of SAVR vs TAVR 3.  Coronary arteries sufficient height above annulus for deployment 4. Optimum angiographic angle for deployment LAO 16 Cranial 3 degrees [name] Electronically Signed: By:  [name] M.D. On: 11/03/2019 12:32    Result Date: 11/03/2019 CLINICAL DATA:  Severe symptomatic aortic stenosis. Pre-TAVR evaluation. EXAM: CT ANGIOGRAPHY CHEST, ABDOMEN AND PELVIS TECHNIQUE: Multidetector CT imaging through the chest, abdomen and pelvis was performed using the standard protocol during bolus administration of intravenous contrast. Multiplanar reconstructed images and MIPs were obtained and reviewed to evaluate the vascular anatomy. CONTRAST:  179mL OMNIPAQUE IOHEXOL 350 MG/ML SOLN COMPARISON:  09/12/2018 chest CT angiogram. 09/19/2018 CT abdomen/pelvis. FINDINGS: CTA CHEST FINDINGS Cardiovascular: Mild cardiomegaly. Diffuse thickening and coarse calcification of the aortic valve. No significant pericardial effusion/thickening. Atherosclerotic thoracic aorta with stable 4.5 cm ascending thoracic aortic aneurysm. Normal caliber pulmonary arteries. No central pulmonary  emboli. Mediastinum/Nodes: No discrete thyroid nodules. Unremarkable esophagus. No pathologically enlarged axillary, mediastinal or hilar lymph nodes. Lungs/Pleura: No pneumothorax. No pleural effusion. No acute consolidative airspace disease, lung masses or significant pulmonary nodules. Musculoskeletal: No aggressive appearing focal osseous lesions. Moderate thoracic spondylosis. CTA ABDOMEN AND PELVIS FINDINGS Hepatobiliary: Normal liver with no liver mass. Normal gallbladder with no radiopaque cholelithiasis. No biliary ductal dilatation. Pancreas: Normal, with no mass or duct dilation. Spleen: Normal size. No mass. Adrenals/Urinary Tract: Normal adrenals. No hydronephrosis. Scattered subcentimeter hypodense renal cortical lesions in the left kidney are too small to characterize and are unchanged, requiring no follow-up. No new contour deforming renal lesions. Normal bladder. Stomach/Bowel: Normal non-distended stomach. Normal caliber small bowel with no small bowel wall thickening. Normal appendix. Moderate sigmoid  diverticulosis, with no large bowel wall thickening or significant pericolonic fat stranding. Vascular/Lymphatic: Atherosclerotic nonaneurysmal abdominal aorta. No pathologically enlarged lymph nodes in the abdomen or pelvis. Reproductive: Mildly enlarged prostate. Other: No pneumoperitoneum, ascites or focal fluid collection. Musculoskeletal: No aggressive appearing focal osseous lesions. Severe degenerative changes throughout the mid to lower lumbar spine. VASCULAR MEASUREMENTS PERTINENT TO TAVR: AORTA: Minimal Aortic Diameter-13.1 x 13.0 mm Severity of Aortic Calcification-moderate RIGHT PELVIS: Right Common Iliac Artery - Minimal Diameter-8.7 x 8.6 mm Tortuosity-severe Calcification-mild Right External Iliac Artery - Minimal Diameter-8.0 x 7.7 mm Tortuosity-mild-to-moderate Calcification-none Right Common Femoral Artery - Minimal Diameter-7.4 x 7.4 mm Tortuosity-none Calcification-none LEFT PELVIS: Left Common Iliac Artery - Minimal Diameter-8.7 x 6.6 mm Tortuosity-moderate Calcification-moderate Left External Iliac Artery - Minimal Diameter-7.4 x 7.3 mm Tortuosity-mild Calcification-none Left Common Femoral Artery - Minimal Diameter-7.3 x 6.3 mm Tortuosity-mild Calcification-mild-to-moderate Review of the MIP images confirms the above findings. IMPRESSION: 1. Vascular findings and measurements pertinent to potential TAVR procedure, as detailed. 2. Diffuse thickening and calcification of the aortic valve, compatible with the reported history of severe symptomatic aortic stenosis. 3. Mild cardiomegaly. 4. Stable 4.5 cm ascending thoracic aortic aneurysm. Ascending thoracic aortic aneurysm. Recommend semi-annual imaging followup by CTA or MRA and referral to cardiothoracic surgery if not already obtained. This recommendation follows 2010 ACCF/AHA/AATS/ACR/ASA/SCA/SCAI/SIR/STS/SVM Guidelines for the Diagnosis and Management of Patients With Thoracic Aortic Disease. Circulation. 2010; [id number]. Aortic aneurysm  NOS (ICD10-I71.9). 5. Moderate sigmoid diverticulosis. 6. Mild prostatomegaly. 7.  Aortic Atherosclerosis (ICD10-I70.0). Electronically Signed   By: Ilona Sorrel M.D.   On: 11/03/2019 13:32     I have independently reviewed the above radiology studies  and reviewed the findings with the patient.   CATH:10/08/2019  Left Main  The vessel exhibits minimal luminal irregularities.  Left Anterior Descending  Mid LAD lesion 60% stenosed  Mid LAD lesion is 60% stenosed. The lesion is eccentric.  First Diagonal Branch  1st Diag-1 lesion 75% stenosed  1st Diag-1 lesion is 75% stenosed.  1st Diag-2 lesion 75% stenosed  1st Diag-2 lesion is 75% stenosed.  Second Diagonal Branch  2nd Diag lesion 50% stenosed  2nd Diag lesion is 50% stenosed.  Left Circumflex  The vessel exhibits minimal luminal irregularities.  Right Coronary Artery  Prox RCA to Mid RCA lesion 35% stenosed  Prox RCA to Mid RCA lesion is 35% stenosed   AO Systolic Pressure [id number] mmHg  AO Diastolic Pressure 70 mmHg  AO Mean 88 mmHg  LV Systolic Pressure 0000000 mmHg  LV Diastolic Pressure 7 mmHg  LV EDP 15 mmHg  AOp Systolic Pressure [id number] mmHg  AOp Diastolic Pressure 64 mmHg  AOp Mean Pressure 83 mmHg  LVp Systolic Pressure 0000000 mmHg  LVp Diastolic Pressure 2 mmHg  LVp EDP Pressure 29 mmHg   1. Left ventricular ejection fraction, by visual estimation, is 55 to 60%. The left ventricle has normal function. Normal left ventricular size. Left ventricular septal wall thickness was mildly increased. There is mildly increased left ventricular hypertrophy. 2. Left ventricular diastolic Doppler parameters are consistent with impaired relaxation pattern of LV diastolic filling. 3. Global right ventricle has normal systolic function.The right ventricular size is normal. No increase in right ventricular wall thickness. 4. Left atrial size was mildly dilated. 5. Right atrial size was normal. 6. Mild mitral annular calcification. 7. The  mitral valve is normal in structure. Mild mitral valve regurgitation. 8. The tricuspid valve is normal in structure. Tricuspid valve regurgitation is mild. 9. The aortic valve is bicuspid Aortic valve regurgitation was not visualized by color flow Doppler. Severe aortic valve stenosis. 10. Bicuspid AV with calcification and restricted motion Sinnce last echo AS now severe. 11. The pulmonic valve was grossly normal. Pulmonic valve regurgitation is mild by color flow Doppler. 12. Aortic dilatation noted. 13. There is moderate dilatation of the ascending aorta measuring 44 mm. 14. Mildly elevated pulmonary artery systolic pressure. In comparison to the previous echocardiogram(s): 02/28/19 EF 55-60%. Ascending aorta dimension 3mm. Moderate AS 31mmHg peak, 39mmHg mean. Left Ventricle: Left ventricular ejection fraction, by visual estimation, is 55 to 60%.  Recent Lab Findings: Lab Results  Component Value Date   WBC 6.3 09/26/2019   HGB 13.2 09/26/2019   HCT 39.1 09/26/2019   PLT 207 09/26/2019   GLUCOSE 93 09/26/2019   CHOL 169 11/05/2019   TRIG 56 11/05/2019   HDL 76 11/05/2019   LDLDIRECT 164.4 12/25/2007   LDLCALC 82 11/05/2019   ALT 18 11/05/2019   AST 19 11/05/2019   NA 140 09/26/2019   K 4.7 09/26/2019   CL 103 09/26/2019   CREATININE 1.04 09/26/2019   BUN 22 09/26/2019   CO2 24 09/26/2019   HGBA1C 5.6 11/21/2018      Assessment / Plan:   #1 bicuspid aortic valve with progressive echo evidence of stenosis over the past 2 years, the patient denies symptoms of angina or congestive heart failure.  This is qualified by recovering over the summer from redo quadriceps surgery on the left.  He notes that this has decreased his activity level to some degree.  #2 mild coronary occlusive disease-recent cardiac catheterization October 2020 moderate eccentric LAD disease 50 to 60% #3 stable mild dilatation of the ascending aorta over scans since 2016 at approximately 4.3 cm   I  reviewed reviewed with the patient the progressive nature of his aortic stenosis confirmed by serial echocardiograms to the point now he has criteria for severe aortic stenosis.  He denies symptoms but notes he has decreased his activities some as he is recovered from recent quadriceps surgery.  With the progressive nature over a relatively short time of his aortic stenosis I have suggested to him that the severe nature of his aortic stenosis will require intervention on his aortic valve.  The patient discussed the issue of yesterday with Dr. Frances Nickels.  The patient would prefer to proceed with TAVR placement rather than standard aortic valve replacement plus minus replacement of the ascending.  We will make arrangements for him to have a second surgical opinion.  He was already seen Dr. Frances Nickels and Dr. Burt Knack.     Grace Isaac MD      [address] [place] [address] Office [phone]  11/20/2019 3:10 PM

## 2019-11-20 ENCOUNTER — Ambulatory Visit (INDEPENDENT_AMBULATORY_CARE_PROVIDER_SITE_OTHER): Payer: Medicare Other | Admitting: Cardiothoracic Surgery

## 2019-11-20 ENCOUNTER — Other Ambulatory Visit: Payer: Self-pay

## 2019-11-20 VITALS — BP 128/89 | HR 72 | Temp 97.7°F | Resp 20 | Ht 72.0 in | Wt 203.0 lb

## 2019-11-20 DIAGNOSIS — Q231 Congenital insufficiency of aortic valve: Secondary | ICD-10-CM

## 2019-11-20 DIAGNOSIS — I712 Thoracic aortic aneurysm, without rupture, unspecified: Secondary | ICD-10-CM

## 2019-11-20 DIAGNOSIS — I350 Nonrheumatic aortic (valve) stenosis: Secondary | ICD-10-CM

## 2019-12-03 ENCOUNTER — Other Ambulatory Visit: Payer: Self-pay

## 2019-12-03 ENCOUNTER — Telehealth (INDEPENDENT_AMBULATORY_CARE_PROVIDER_SITE_OTHER): Payer: Medicare Other | Admitting: Surgery

## 2019-12-03 DIAGNOSIS — I712 Thoracic aortic aneurysm, without rupture: Secondary | ICD-10-CM

## 2019-12-03 DIAGNOSIS — I350 Nonrheumatic aortic (valve) stenosis: Secondary | ICD-10-CM

## 2019-12-05 NOTE — Progress Notes (Signed)
[facility]   [address]       [place] [address]             [phone]     CARDIOTHORACIC SURGERY TELEPHONE VIRTUAL OFFICE NOTE  Referring Provider is Grace Isaac, MD Primary Cardiologist is [name], MD PCP is [name], MD   HPI:  I spoke with [name] (DOB 08/04/1951 ) via telephone on 12/03/2019 at 12:30 PM and verified that I was speaking with the correct person using more than one form of identification.  We discussed the reason(s) for conducting our visit virtually instead of in-person.  The patient expressed understanding the circumstances and agreed to proceed as described.   The patient is a 68 year old fairly healthy gentleman with no prior cardiac history who reportedly had an intracranial bleed without history of trauma treated in [place] and requiring craniotomy in July 2016.  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  He has been followed by Dr. Servando Snare for an ascending aortic aneurysm.  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  There is nonobstructive coronary disease in the proximal and mid LAD.  His most recent echocardiogram on 09/26/2019 showed a bicuspid aortic valve with severe aortic stenosis with a mean gradient of 45 mmHg and a peak gradient of 69 mmHg.  This had significantly progressed compared to his prior echo in March 2021 the aortic valve mean gradient was 31 mmHg.  He subsequently underwent cardiac catheterization which showed moderate eccentric proximal and mid LAD stenosis estimated at 50 to 60%.  There was a severe stenosis of a moderate size first diagonal branch that was not felt to be suitable for PCI.  He denies any chest pain or pressure.  He has had no shortness of breath, fatigue, orthopnea, or  dizziness.  His activities have been somewhat limited due to recovering from redo quadricep surgery on the left.  He said last year he had bilateral quadriceps repair.  He was seen by Dr. Servando Snare on 11/20/2019 and was not interested in open surgical AVR +/-replacement of the ascending aorta but said that he would agree to transcatheter aortic valve replacement.   Current Outpatient Medications  Medication Sig Dispense Refill  . acetaminophen (TYLENOL) 500 MG tablet Take 500-1,000 mg by mouth every 6 (six) hours as needed for mild pain.    Marland Kitchen allopurinol (ZYLOPRIM) 300 MG tablet TAKE 1 TABLET BY MOUTH EVERY DAY 90 tablet 0  . atorvastatin (LIPITOR) 20 MG tablet Take 1 tablet (20 mg total) by mouth daily at 6 PM. 90 tablet 3  . esomeprazole (NEXIUM) 40 MG capsule Take 1 capsule (40 mg total) by mouth daily. (Patient taking differently: Take 20 mg by mouth daily. ) 30 capsule 0  . metoprolol succinate (TOPROL-XL) 25 MG 24 hr tablet Take 25 mg by mouth daily.    . Multiple Vitamin (MULTIVITAMIN WITH MINERALS) TABS tablet Take 1 tablet by mouth daily.     Current Facility-Administered Medications  Medication Dose Route Frequency Provider Last Rate Last Admin  . sodium chloride flush (NS) 0.9 % injection 3 mL  3 mL Intravenous Q12H Josue Hector, MD         Past Medical History:  Diagnosis Date  . Arthritis    R  shoulder, great toes, hands- has gout & has injections in knees with Dr. Estanislado Pandy   . Cancer ([facility])    skin ca-   . GERD (gastroesophageal reflux disease)   . History of hiatal hernia   . SDH (subdural hematoma) ([facility])     Past Surgical History:  Procedure Laterality Date  . BRAIN SURGERY  2016   evacuation of SDH  . CARPAL TUNNEL RELEASE Bilateral   . GREEN LIGHT LASER TURP (TRANSURETHRAL RESECTION OF PROSTATE  04/27/2017  . KNEE ARTHROPLASTY    . LEFT HEART CATH AND CORONARY ANGIOGRAPHY N/A 10/08/2019   Procedure: LEFT HEART CATH AND CORONARY ANGIOGRAPHY;  Surgeon: Sherren Mocha, MD;  Location: [facility];  Service: Cardiovascular;  Laterality: N/A;  . QUADRICEPS TENDON REPAIR Bilateral 06/15/2017   Procedure: REPAIR QUADRICEP TENDON;  Surgeon: Meredith Pel, MD;  Location: [facility];  Service: Orthopedics;  Laterality: Bilateral;  . SHOULDER SURGERY Right     Family History  Problem Relation Age of Onset  . Parkinson's disease Mother   . Heart disease Father   . Parkinson's disease Brother   . Diabetes Brother     Social History   Socioeconomic History  . Marital status: Divorced    Spouse name: Not on file  . Number of children: Not on file  . Years of education: Not on file  . Highest education level: Not on file  Occupational History  . Not on file  Tobacco Use  . Smoking status: Never Smoker  . Smokeless tobacco: Never Used  Substance and Sexual Activity  . Alcohol use: Yes    Alcohol/week: 8.0 standard drinks    Types: 7 Glasses of wine, 1 Shots of liquor per week  . Drug use: No  . Sexual activity: Not on file  Other Topics Concern  . Not on file  Social History Narrative  . Not on file   Social Determinants of Health   Financial Resource Strain:   . Difficulty of Paying Living Expenses: Not on file  Food Insecurity:   . Worried About Charity fundraiser in the Last Year: Not on file  . Ran Out of Food in the Last Year: Not on file  Transportation Needs:   . Lack of Transportation (Medical): Not on file  . Lack of Transportation (Non-Medical): Not on file  Physical Activity:   . Days of Exercise per Week: Not on file  . Minutes of Exercise per Session: Not on file  Stress:   . Feeling of Stress : Not on file  Social Connections:   . Frequency of Communication with Friends and Family: Not on file  . Frequency of Social Gatherings with Friends and Family: Not on file  . Attends Religious Services: Not on file  . Active Member of Clubs or Organizations: Not on file  . Attends Archivist Meetings: Not on  file  . Marital Status: Not on file  Intimate Partner Violence:   . Fear of Current or Ex-Partner: Not on file  . Emotionally Abused: Not on file  . Physically Abused: Not on file  . Sexually Abused: Not on file    Current Outpatient Medications  Medication Sig Dispense Refill  . acetaminophen (TYLENOL) 500 MG tablet Take 500-1,000 mg by mouth every 6 (six) hours as needed for mild pain.    Marland Kitchen allopurinol (ZYLOPRIM) 300 MG tablet TAKE 1 TABLET BY MOUTH EVERY DAY 90 tablet 0  . atorvastatin (LIPITOR) 20 MG tablet  Take 1 tablet (20 mg total) by mouth daily at 6 PM. 90 tablet 3  . esomeprazole (NEXIUM) 40 MG capsule Take 1 capsule (40 mg total) by mouth daily. (Patient taking differently: Take 20 mg by mouth daily. ) 30 capsule 0  . metoprolol succinate (TOPROL-XL) 25 MG 24 hr tablet Take 25 mg by mouth daily.    . Multiple Vitamin (MULTIVITAMIN WITH MINERALS) TABS tablet Take 1 tablet by mouth daily.     Current Facility-Administered Medications  Medication Dose Route Frequency Provider Last Rate Last Admin  . sodium chloride flush (NS) 0.9 % injection 3 mL  3 mL Intravenous Q12H Josue Hector, MD        Allergies  Allergen Reactions  . Ciprofloxacin Other (See Comments)    Patient states instructed not to take due to history of Aneurysms  . Quinolones     Patient was warned about not using Cipro and similar antibiotics. Recent studies have raised concern that fluoroquinolone antibiotics could be associated with an increased risk of aortic aneurysm Fluoroquinolones have non-antimicrobial properties that might jeopardise the integrity of the extracellular matrix of the vascular wall In a  propensity score matched cohort study in [place], there was a 66% increased rate of aortic aneurysm or dissection associated with oral fluoroquinolone use, compared wit      Review of Systems:   General:  normal appetite, normal energy, no weight gain, no weight loss, no  fever  Cardiac:  no  chest pain with exertion, no chest pain at rest, noSOB with no exertion, no resting SOB, no PND, no orthopnea, no palpitations, no arrhythmia, no atrial fibrillation, no LE edema, no dizzy spells, no syncope  Respiratory:  no shortness of breath, no home oxygen, no productive cough, no dry cough, no bronchitis, no wheezing, no hemoptysis, no asthma, no pain with inspiration or cough, no sleep apnea, no CPAP at night  GI:   no difficulty swallowing, no reflux, no frequent heartburn, no hiatal hernia, no abdominal pain, no constipation, no diarrhea, no hematochezia, no hematemesis, no melena  GU:   no dysuria,  no frequency, no urinary tract infection, no hematuria, no enlarged prostate, no kidney stones, no kidney disease  Vascular:  no pain suggestive of claudication, no pain in feet, no leg cramps, no varicose veins, no DVT, no non-healing foot ulcer  Neuro:   no stroke, no TIA's, no seizures, no headaches, no temporary blindness one eye,  no slurred speech, no peripheral neuropathy, no chronic pain, no instability of gait, no memory/cognitive dysfunction  Musculoskeletal: no arthritis, no joint swelling, no myalgias, no difficulty walking, normal mobility   Skin:   no rash, no itching, no skin infections, no pressure sores or ulcerations  Psych:   no anxiety, no depression, no nervousness, no unusual recent stress  Eyes:   no blurry vision, no floaters, no recent vision changes, + wears glasses or contacts  ENT:   no hearing loss, no loose or painful teeth, no dentures, last saw dentist this year  Hematologic:  no easy bruising, no abnormal bleeding, no clotting disorder, no frequent epistaxis  Endocrine:  no diabetes, does not check CBG's at home       Physical Exam:  Not performed due to virtual visit  Diagnostic Tests:   Patient Name:   Jose [name] Date of Exam: 09/26/2019 Medical Rec #:  [id number]       Height:       72.0 in Accession #:  [id number]      Weight:       194.0  lb Date of Birth:  09/02/1951       BSA:          2.10 m Patient Age:    76 years        BP:           127/86 mmHg Patient Gender: M               HR:           77 bpm. Exam Location:  [facility]  Procedure: 2D Echo, 3D Echo, Cardiac Doppler and Color Doppler  Indications:    I35 Aortic stenosis   History:        Patient has prior history of Echocardiogram examinations, most                 recent 02/28/2019. Aortic Valve Disease and Bicuspid aortic                 valve. Anemia.   Sonographer:    Jessee Avers, RDCS Referring Phys: [place]    1. Left ventricular ejection fraction, by visual estimation, is 55 to 60%. The left ventricle has normal function. Normal left ventricular size. Left ventricular septal wall thickness was mildly increased. There is mildly increased left ventricular  hypertrophy.  2. Left ventricular diastolic Doppler parameters are consistent with impaired relaxation pattern of LV diastolic filling.  3. Global right ventricle has normal systolic function.The right ventricular size is normal. No increase in right ventricular wall thickness.  4. Left atrial size was mildly dilated.  5. Right atrial size was normal.  6. Mild mitral annular calcification.  7. The mitral valve is normal in structure. Mild mitral valve regurgitation.  8. The tricuspid valve is normal in structure. Tricuspid valve regurgitation is mild.  9. The aortic valve is bicuspid Aortic valve regurgitation was not visualized by color flow Doppler. Severe aortic valve stenosis. 10. Bicuspid AV with calcification and restricted motion Sinnce last echo AS now severe. 11. The pulmonic valve was grossly normal. Pulmonic valve regurgitation is mild by color flow Doppler. 12. Aortic dilatation noted. 13. There is moderate dilatation of the ascending aorta measuring 44 mm. 14. Mildly elevated pulmonary artery systolic pressure.  In comparison to the previous  echocardiogram(s): 02/28/19 EF 55-60%. Ascending aorta dimension 68mm. Moderate AS 19mmHg peak, 35mmHg mean. FINDINGS  Left Ventricle: Left ventricular ejection fraction, by visual estimation, is 55 to 60%. The left ventricle has normal function. Left ventricular septal wall thickness was mildly increased. There is mildly increased left ventricular hypertrophy. Normal  left ventricular size. Spectral Doppler shows Left ventricular diastolic Doppler parameters are consistent with impaired relaxation pattern of LV diastolic filling.  Right Ventricle: The right ventricular size is normal. No increase in right ventricular wall thickness. Global RV systolic function is has normal systolic function. The tricuspid regurgitant velocity is 2.77 m/s, and with an assumed right atrial pressure  of 8 mmHg, the estimated right ventricular systolic pressure is mildly elevated at 38.7 mmHg.  Left Atrium: Left atrial size was mildly dilated.  Right Atrium: Right atrial size was normal in size  Pericardium: There is no evidence of pericardial effusion.  Mitral Valve: The mitral valve is normal in structure. There is mild thickening of the mitral valve leaflet(s). There is mild calcification of the mitral valve leaflet(s). Mild mitral annular calcification. Mild mitral valve regurgitation.  Tricuspid  Valve: The tricuspid valve is normal in structure. Tricuspid valve regurgitation is mild by color flow Doppler.  Aortic Valve: The aortic valve is bicuspid. Aortic valve regurgitation was not visualized by color flow Doppler. Severe aortic stenosis is present. Aortic valve mean gradient measures 45.0 mmHg. Aortic valve peak gradient measures 68.9 mmHg. Aortic valve  area, by VTI measures 0.74 cm. Bicuspid AV with calcification and restricted motion Sinnce last echo AS now severe.  Pulmonic Valve: The pulmonic valve was grossly normal. Pulmonic valve regurgitation is mild by color flow Doppler.  Aorta:  Aortic dilatation noted. There is moderate dilatation of the ascending aorta measuring 44 mm.  IAS/Shunts: No atrial level shunt detected by color flow Doppler.     LEFT VENTRICLE PLAX 2D LVIDd:         4.70 cm  Diastology LVIDs:         3.30 cm  LV e' lateral:   8.40 cm/s LV PW:         0.90 cm  LV E/e' lateral: 7.1 LV IVS:        1.00 cm  LV e' medial:    5.55 cm/s LVOT diam:     2.10 cm  LV E/e' medial:  10.8 LV SV:         58 ml LV SV Index:   27.39 LVOT Area:     3.46 cm                           3D Volume EF:                         3D EF:        60 %                         LV EDV:       135 ml                         LV ESV:       55 ml                         LV SV:        81 ml  RIGHT VENTRICLE RV Basal diam:  4.70 cm RV Mid diam:    3.50 cm RV S prime:     8.48 cm/s RVSP:           38.7 mmHg  LEFT ATRIUM             Index       RIGHT ATRIUM           Index LA diam:        4.10 cm 1.95 cm/m  RA Pressure: 8.00 mmHg LA Vol (A2C):   21.7 ml 10.32 ml/m RA Area:     19.70 cm LA Vol (A4C):   54.4 ml 25.87 ml/m RA Volume:   53.20 ml  25.30 ml/m LA Biplane Vol: 34.4 ml 16.36 ml/m  AORTIC VALVE AV Area (Vmax):    0.77 cm AV Area (Vmean):   0.74 cm AV Area (VTI):     0.74 cm AV Vmax:           415.00 cm/s AV Vmean:          298.667 cm/s AV VTI:  1.130 m AV Peak Grad:      68.9 mmHg AV Mean Grad:      45.0 mmHg LVOT Vmax:         92.40 cm/s LVOT Vmean:        63.800 cm/s LVOT VTI:          0.240 m LVOT/AV VTI ratio: 0.21   AORTA Ao Root diam: 3.90 cm Ao Asc diam:  4.40 cm  MITRAL VALVE                        TRICUSPID VALVE MV Area (PHT): 2.32 cm             TR Peak grad:   30.7 mmHg MV PHT:        94.83 msec           TR Vmax:        277.00 cm/s MV Decel Time: 327 msec             Estimated RAP:  8.00 mmHg MV E velocity: 59.70 cm/s 103 cm/s  RVSP:           38.7 mmHg MV A velocity: 94.60 cm/s 70.3 cm/s MV E/A ratio:  0.63       1.5        SHUNTS                                     Systemic VTI:  0.24 m                                     Systemic Diam: 2.10 cm    [name] MD Electronically signed by [name] MD Signature Date/Time: 09/26/2019/10:08:05 AM    Physicians Panel Physicians Referring Physician Case Authorizing Physician  Sherren Mocha, MD (Primary)       Procedures LEFT HEART CATH AND CORONARY ANGIOGRAPHY     Conclusion 1. Moderate eccentric proxima/mid LAD stenosis  2. Severe stenosis of a moderate sized first diagonal branch, not suitable for PCI  3. Patent left main, LCx, and RCA with mild nonobstructive plaque  4. Severely calcified aortic valve leaflets by plain fluoroscopy with a 25 mmHg peak to peak gradient  Recommend: reviewed findings with [name]. Will arrange TAVR CT studies and refer to [name] who has been following the patient for his dilated ascending aorta. Multidisciplinary valve team review of case.            Indications Severe aortic stenosis [I35.0 (ICD-10-CM)]     Procedural Details Technical Details INDICATION: Severe bicuspid aortic stenosis. Pt with 43 mm ascending aorta and progressive, now severe bicuspid aortic stenosis, referred for diagnostic right and left heart catheterization.  PROCEDURAL DETAILS: Right heart cath is attempted via right antecubital venous access. A wire would not pass beyond the mid upper arm. Venography is preformed but I was not able to access a central vein. The right wrist is prepped, draped, and anesthetized with 1% lidocaine. Using the modified Seldinger technique, a 5/6 French Slender sheath is introduced into the right radial artery. 3 mg of verapamil is administered through the sheath, weight-based unfractionated heparin was administered intravenously. Standard Judkins catheters are used for selective coronary angiography. LV pressure is recorded and an aortic valve pullback gradient is measured. The procedure is  technically difficult because of subclavian tortuosity. For the RCA injections, a JR4 guide is used with an 0.035 wire in the catheter to provide support. Catheter exchanges are performed over an exchange length guidewire. There are no immediate procedural complications. A TR band is used for radial hemostasis at the completion of the procedure. The patient was transferred to the post catheterization recovery area for further monitoring.    Estimated blood loss <50 mL.   During this procedure medications were administered to achieve and maintain moderate conscious sedation while the patient's heart rate, blood pressure, and oxygen saturation were continuously monitored and I was present face-to-face 100% of this time.     Medications (Filter: Administrations occurring from 10/08/19 0817 to 10/08/19 0929)  Continuous medications are totaled by the amount administered until 10/08/19 0929.  Heparin (Porcine) in NaCl 1000-0.9 UT/500ML-% SOLN (mL) Total volume: 1,000 mL  Date/Time   Rate/Dose/Volume Action  10/08/19 0836  500 mL Given  0836  500 mL Given  midazolam (VERSED) injection (mg) Total dose: 3 mg  Date/Time   Rate/Dose/Volume Action  10/08/19 0845  2 mg Given  0900  1 mg Given  fentaNYL (SUBLIMAZE) injection (mcg) Total dose: 50 mcg  Date/Time   Rate/Dose/Volume Action  10/08/19 0845  25 mcg Given  0900  25 mcg Given  lidocaine (PF) (XYLOCAINE) 1 % injection (mL) Total volume: 4 mL  Date/Time   Rate/Dose/Volume Action  10/08/19 0848  2 mL Given  0859  2 mL Given  nitroGLYCERIN 1 mg/10 mL (100 mcg/mL) - IR/CATH LAB (mcg) Total dose: 200 mcg  Date/Time   Rate/Dose/Volume Action  10/08/19 0857  200 mcg Given  Radial Cocktail/Verapamil only (mL) Total volume: 10 mL  Date/Time   Rate/Dose/Volume Action  10/08/19 0900  10 mL Given  heparin injection (Units) Total dose: 5,000 Units  Date/Time   Rate/Dose/Volume Action  10/08/19 0912  5,000 Units Given  iohexol  (OMNIPAQUE) 350 MG/ML injection (mL) Total volume: 60 mL  Date/Time   Rate/Dose/Volume Action  10/08/19 0923  60 mL Given     Sedation Time Sedation Time Physician-1: 34 minutes 32 seconds        Contrast Medication Name Total Dose  iohexol (OMNIPAQUE) 350 MG/ML injection 60 mL     Radiation/Fluoro Fluoro time: 8.8 (min)  DAP: [id number] (mGycm2)  Cumulative Air Kerma: 517 (mGy)        Coronary Findings Diagnostic Dominance: Right  Left Main  The vessel exhibits minimal luminal irregularities.   Left Anterior Descending  Mid LAD lesion 60% stenosed  Mid LAD lesion is 60% stenosed. The lesion is eccentric.   First Diagonal Branch  1st Diag-1 lesion 75% stenosed  1st Diag-1 lesion is 75% stenosed.  1st Diag-2 lesion 75% stenosed  1st Diag-2 lesion is 75% stenosed.   Second Diagonal Branch  2nd Diag lesion 50% stenosed  2nd Diag lesion is 50% stenosed.   Left Circumflex  The vessel exhibits minimal luminal irregularities.   Right Coronary Artery  Prox RCA to Mid RCA lesion 35% stenosed  Prox RCA to Mid RCA lesion is 35% stenosed.  Intervention No interventions have been documented.                            Coronary Diagrams Diagnostic Dominance: Right  &&&&&  Intervention      Implants  No implant documentation for this case.      Syngo Images Link to Procedure Log  Show images for CARDIAC CATHETERIZATION Procedure Log     Images on Long Term Storage   Show images for Saur, Tequan A         Hemo Data   Most Recent Value  AO Systolic Pressure XX123456 mmHg  AO Diastolic Pressure 70 mmHg  AO Mean 88 mmHg  LV Systolic Pressure 0000000 mmHg  LV Diastolic Pressure 7 mmHg  LV EDP 15 mmHg  AOp Systolic Pressure XX123456 mmHg  AOp Diastolic Pressure 64 mmHg  AOp Mean Pressure 83 mmHg  LVp Systolic Pressure 0000000 mmHg  LVp Diastolic Pressure 2 mmHg  LVp EDP Pressure 29 mmHg    ADDENDUM REPORT: 11/03/2019 13:33  EXAM: OVER-READ  INTERPRETATION  CT CHEST  The following report is an over-read performed by radiologist Dr. Samara Snide [facility] on 11/03/2019. This over-read does not include interpretation of cardiac or coronary anatomy or pathology. The CTA interpretation by the cardiologist is attached.  COMPARISON:  09/12/2018 chest CT angiogram.  FINDINGS: Please see the separate concurrent chest CT angiogram report for details.  IMPRESSION: Please see the separate concurrent chest CT angiogram report for details.   Electronically Signed   By: Ilona Sorrel M.D.   On: 11/03/2019 13:33   Addended by Sharyn Blitz, MD on 11/03/2019 1:35 PM    Study Result  CLINICAL DATA:  Aortic Stenosis  EXAM: Cardiac TAVR CT  MEDICATIONS: None  TECHNIQUE: The patient was scanned on a Siemens Force [id number] slice scanner. A 120 kV retrospective scan was triggered in the ascending thoracic aorta at 140 HU's. Gantry rotation speed was 250 msecs and collimation was .6 mm. No beta blockade or nitro were given. The 3D data set was reconstructed in 5% intervals of the R-R cycle. Systolic and diastolic phases were analyzed on a dedicated work station using MPR, MIP and VRT modes. The patient received 80 cc of contrast.  The patient was scanned on a Siemens Force [id number] slice scanner. Gantry rotation speed was 250 msecs. Collimation was .6 mm. A 100 kV prospective scan was triggered in the ascending thoracic aorta at 140 HU's Full mA was used between 35% and 75% of the R-R interval. Average HR during the scan was 47 bpm. The 3D data set was interpreted on a dedicated work station using MPR, MIP and VRT modes. A total of 80 cc of contrast was used.  FINDINGS: Aortic Valve: Bicuspid and calcified with restricted motion. Appears to be large right and left cusps with no raphe  Aorta: Mild to moderate fusiform dilatation normal arch vessels no coarctation  Sino-tubular Junction: 34  mm  Ascending Thoracic Aorta: 43 mm  Aortic Arch: 36 mm  Descending Thoracic Aorta: 24 mm  Sinus of Valsalva Measurements: Bicuspid valve with right cusp including RCA ostia and left including LM ostia Long axis diameter 44 mm minor axis dimension 30 mm  Coronary Artery Height above Annulus:  Left Main: 16.7 mm above annulus  Right Coronary: 16.4 mm above annulus  Virtual Basal Annulus Measurements:  Maximum / Minimum Diameter: 26.6 mm x 23.5 mm  Perimeter:  --  Area: 479 mm 2  Coronary Arteries: Sufficient height above annulus for deployment  Optimum Fluoroscopic Angle for Delivery: LAO 16 Cranial 3 degrees  IMPRESSION: 1. Bicuspid AV with annular area of 479 mm 2 suitable for a 26 mm Sapien 3 valve  2. Fusiform dilatation of the ascending aortic root at 4.3 cm In setting of bicuspid AV this will have to be taken  into consideration regarding choice of SAVR vs TAVR  3.  Coronary arteries sufficient height above annulus for deployment  4. Optimum angiographic angle for deployment LAO 16 Cranial 3 degrees  [name]  Electronically Signed: By: [name] M.D. On: 11/03/2019 12:32       CLINICAL DATA:  Severe symptomatic aortic stenosis. Pre-TAVR evaluation.  EXAM: CT ANGIOGRAPHY CHEST, ABDOMEN AND PELVIS  TECHNIQUE: Multidetector CT imaging through the chest, abdomen and pelvis was performed using the standard protocol during bolus administration of intravenous contrast. Multiplanar reconstructed images and MIPs were obtained and reviewed to evaluate the vascular anatomy.  CONTRAST:  128mL OMNIPAQUE IOHEXOL 350 MG/ML SOLN  COMPARISON:  09/12/2018 chest CT angiogram. 09/19/2018 CT abdomen/pelvis.  FINDINGS: CTA CHEST FINDINGS  Cardiovascular: Mild cardiomegaly. Diffuse thickening and coarse calcification of the aortic valve. No significant pericardial effusion/thickening. Atherosclerotic thoracic aorta with stable  4.5 cm ascending thoracic aortic aneurysm. Normal caliber pulmonary arteries. No central pulmonary emboli.  Mediastinum/Nodes: No discrete thyroid nodules. Unremarkable esophagus. No pathologically enlarged axillary, mediastinal or hilar lymph nodes.  Lungs/Pleura: No pneumothorax. No pleural effusion. No acute consolidative airspace disease, lung masses or significant pulmonary nodules.  Musculoskeletal: No aggressive appearing focal osseous lesions. Moderate thoracic spondylosis.  CTA ABDOMEN AND PELVIS FINDINGS  Hepatobiliary: Normal liver with no liver mass. Normal gallbladder with no radiopaque cholelithiasis. No biliary ductal dilatation.  Pancreas: Normal, with no mass or duct dilation.  Spleen: Normal size. No mass.  Adrenals/Urinary Tract: Normal adrenals. No hydronephrosis. Scattered subcentimeter hypodense renal cortical lesions in the left kidney are too small to characterize and are unchanged, requiring no follow-up. No new contour deforming renal lesions. Normal bladder.  Stomach/Bowel: Normal non-distended stomach. Normal caliber small bowel with no small bowel wall thickening. Normal appendix. Moderate sigmoid diverticulosis, with no large bowel wall thickening or significant pericolonic fat stranding.  Vascular/Lymphatic: Atherosclerotic nonaneurysmal abdominal aorta. No pathologically enlarged lymph nodes in the abdomen or pelvis.  Reproductive: Mildly enlarged prostate.  Other: No pneumoperitoneum, ascites or focal fluid collection.  Musculoskeletal: No aggressive appearing focal osseous lesions. Severe degenerative changes throughout the mid to lower lumbar spine.  VASCULAR MEASUREMENTS PERTINENT TO TAVR:  AORTA:  Minimal Aortic Diameter-13.1 x 13.0 mm  Severity of Aortic Calcification-moderate  RIGHT PELVIS:  Right Common Iliac Artery -  Minimal Diameter-8.7 x 8.6  mm  Tortuosity-severe  Calcification-mild  Right External Iliac Artery -  Minimal Diameter-8.0 x 7.7 mm  Tortuosity-mild-to-moderate  Calcification-none  Right Common Femoral Artery -  Minimal Diameter-7.4 x 7.4 mm  Tortuosity-none  Calcification-none  LEFT PELVIS:  Left Common Iliac Artery -  Minimal Diameter-8.7 x 6.6 mm  Tortuosity-moderate  Calcification-moderate  Left External Iliac Artery -  Minimal Diameter-7.4 x 7.3 mm  Tortuosity-mild  Calcification-none  Left Common Femoral Artery -  Minimal Diameter-7.3 x 6.3 mm  Tortuosity-mild  Calcification-mild-to-moderate  Review of the MIP images confirms the above findings.  IMPRESSION: 1. Vascular findings and measurements pertinent to potential TAVR procedure, as detailed. 2. Diffuse thickening and calcification of the aortic valve, compatible with the reported history of severe symptomatic aortic stenosis. 3. Mild cardiomegaly. 4. Stable 4.5 cm ascending thoracic aortic aneurysm. Ascending thoracic aortic aneurysm. Recommend semi-annual imaging followup by CTA or MRA and referral to cardiothoracic surgery if not already obtained. This recommendation follows 2010 ACCF/AHA/AATS/ACR/ASA/SCA/SCAI/SIR/STS/SVM Guidelines for the Diagnosis and Management of Patients With Thoracic Aortic Disease. Circulation. 2010; [id number]. Aortic aneurysm NOS (ICD10-I71.9). 5. Moderate sigmoid diverticulosis. 6. Mild prostatomegaly. 7.  Aortic Atherosclerosis (  ICD10-I70.0).   Electronically Signed   By: Ilona Sorrel M.D.   On: 11/03/2019 13:32  Impression:  This 68 year old gentleman has stage C, asymptomatic, severe bicuspid aortic valve stenosis with a 4.3 cm fusiform ascending aortic aneurysm.  I have personally reviewed his 2D echocardiogram, cardiac catheterization, and CTA studies.  His echocardiogram shows a calcified bicuspid aortic valve with an increase in the mean  gradient from 31 mmHg in March to 39mmHg now.  His left ventricular systolic function is preserved.  Cardiac catheterization shows moderate disease in the mid LAD and a high-grade diagonal stenosis which is a relatively small vessel.  There are no other significant stenoses and I think this can be treated medically.  I agree that aortic valve replacement is indicated in this patient with rapidly progressive aortic stenosis and a bicuspid aortic valve.  He denies any symptoms but is not very active due to recovering from quadriceps surgery.  I think his valve should be replaced before he develops left ventricular deterioration.  He said that he is not interested in open surgical aortic valve replacement but would agree to transcatheter valve replacement if we thought that was an option.  His gated cardiac CTA shows anatomy suitable for transcatheter aortic valve replacement using a Sapien 3 valve.  His abdominal and pelvic CTA shows adequate pelvic vascular anatomy to allow transfemoral insertion.  The patient was counseled at length regarding treatment alternatives for management of severe symptomatic aortic stenosis. The risks and benefits of surgical intervention has been discussed in detail. Long-term prognosis with medical therapy was discussed. Alternative approaches such as conventional surgical aortic valve replacement, transcatheter aortic valve replacement, and palliative medical therapy were compared and contrasted at length. This discussion was placed in the context of the patient's own specific clinical presentation and past medical history. All of his questions have been addressed.   Following the decision to proceed with transcatheter aortic valve replacement, a discussion was held regarding what types of management strategies would be attempted intraoperatively in the event of life-threatening complications, including whether or not the patient would be considered a candidate for the use of  cardiopulmonary bypass and/or conversion to open sternotomy for attempted surgical intervention.  He is a low risk surgical patient and would be a candidate for emergent sternotomy to manage any intraoperative complications.    The patient has been advised of a variety of complications that might develop including but not limited to risks of death, stroke, paravalvular leak, aortic dissection or other major vascular complications, aortic annulus rupture, device embolization, cardiac rupture or perforation, mitral regurgitation, acute myocardial infarction, arrhythmia, heart block or bradycardia requiring permanent pacemaker placement, congestive heart failure, respiratory failure, renal failure, pneumonia, infection, other late complications related to structural valve deterioration or migration, or other complications that might ultimately cause a temporary or permanent loss of functional independence or other long term morbidity. The patient provides full informed consent for the procedure as described and all questions were answered.    Plan:  He will be scheduled for transfemoral transcatheter aortic valve replacement in January 2021.  I spent 45 minutes performing this consultation and > 50% of this time was spent face to face counseling and coordinating the care of this patient's severe bicuspid aortic valve stenosis.   Gaye Pollack, MD 12/03/2019     .

## 2019-12-08 ENCOUNTER — Encounter: Payer: Self-pay | Admitting: Physician Assistant

## 2019-12-08 ENCOUNTER — Other Ambulatory Visit: Payer: Self-pay | Admitting: Physician Assistant

## 2019-12-08 DIAGNOSIS — I10 Essential (primary) hypertension: Secondary | ICD-10-CM | POA: Insufficient documentation

## 2019-12-15 ENCOUNTER — Encounter: Payer: Self-pay | Admitting: Physician Assistant

## 2019-12-16 ENCOUNTER — Ambulatory Visit (INDEPENDENT_AMBULATORY_CARE_PROVIDER_SITE_OTHER): Payer: Medicare Other | Admitting: Cardiovascular Disease

## 2019-12-16 ENCOUNTER — Other Ambulatory Visit: Payer: Self-pay

## 2019-12-16 ENCOUNTER — Other Ambulatory Visit: Payer: Self-pay | Admitting: Physician Assistant

## 2019-12-16 ENCOUNTER — Encounter: Payer: Self-pay | Admitting: Cardiovascular Disease

## 2019-12-16 VITALS — BP 122/78 | HR 79 | Ht 72.0 in | Wt 209.8 lb

## 2019-12-16 DIAGNOSIS — I350 Nonrheumatic aortic (valve) stenosis: Secondary | ICD-10-CM | POA: Diagnosis not present

## 2019-12-16 NOTE — Progress Notes (Addendum)
Cardiology Office Note:    Date:  12/16/2019   ID:  Jose Jose [name], DOB 1951-05-27, MRN [id number]  PCP:  Jose Spurling, MD  Cardiologist:  Jose Rouge, MD  Electrophysiologist:  None   Referring MD: Jose Spurling, MD   Chief Complaint  Patient presents with  . Aortic Stenosis   History of Present Illness:    Jose Jose [name] is Jose [name] 68 y.o. male presenting for evaluation of severe aortic stenosis.  The patient has been followed for Jose [name] dilated ascending aorta now for several years.  He has Jose [name] known bicuspid aortic valve and has developed progressive aortic stenosis.  His mean gradient has increased from 22 mmHg and 2018 to 45 mmHg in 2020.  The peak transaortic velocity is now in excess of 4 m/s and his LVEF remains preserved at 55 to 60%.  The patient has undergone extensive evaluation by Jose Jose [name] as well as multidisciplinary team evaluation by both Jose Jose [name] and Jose Jose [name] with cardiac surgery.  After review of all treatment options, he has elected to proceed with TAVR for treatment of severe aortic stenosis.  He has undergone diagnostic right and left heart catheterization, CTA studies, and echo evaluation.  The patient is here alone today.  He reports no significant cardiopulmonary limitation.  He denies chest pain, shortness of breath, orthopnea, PND, heart palpitations, lightheadedness, or syncope.  He has been physically limited because of quadricep problems over the past few years.  He had bilateral quadricep repairs and required redo left-sided quadricep surgery in July of this year.  He has been participating in rehab and was just recently released by his orthopedic surgeon in [place], [place] after he has made adequate progress.  Past Medical History:  Diagnosis Date  . Arthritis    R shoulder, great toes, hands- has gout & has injections in knees with Dr. Estanislado [name]   . Bicuspid aortic valve with ascending aorta 4.0 to 4.5 cm in diameter   . Cancer ([place])    skin  ca-   . GERD (gastroesophageal reflux disease)   . History of hiatal hernia   . Hypertension   . SDH (subdural hematoma) ([place])   . Severe aortic stenosis   . Vertebral artery aneurysm [facility])     Past Surgical History:  Procedure Laterality Date  . BRAIN SURGERY  2016   evacuation of SDH  . CARPAL TUNNEL RELEASE Bilateral   . GREEN LIGHT LASER TURP (TRANSURETHRAL RESECTION OF PROSTATE  04/27/2017  . KNEE ARTHROPLASTY    . LEFT HEART CATH AND CORONARY ANGIOGRAPHY N/Jose [name] 10/08/2019   Procedure: LEFT HEART CATH AND CORONARY ANGIOGRAPHY;  Surgeon: Jose Mocha, MD;  Location: [facility];  Service: Cardiovascular;  Laterality: N/Jose [name];  . QUADRICEPS TENDON REPAIR Bilateral 06/15/2017   Procedure: REPAIR QUADRICEP TENDON;  Surgeon: Jose Pel, MD;  Location: [facility];  Service: Orthopedics;  Laterality: Bilateral;  . SHOULDER SURGERY Right     Current Medications: Current Meds  Medication Sig  . allopurinol (ZYLOPRIM) 300 MG tablet TAKE 1 TABLET BY MOUTH EVERY DAY  . atorvastatin (LIPITOR) 20 MG tablet Take 1 tablet (20 mg total) by mouth daily at 6 PM.  . esomeprazole (NEXIUM) 20 MG capsule Take 20 mg by mouth daily at 12 noon.  . metoprolol succinate (TOPROL-XL) 25 MG 24 hr tablet Take 25 mg by mouth daily.  . Multiple Vitamin (MULTIVITAMIN WITH MINERALS) TABS tablet Take 1 tablet by mouth daily.   Current Facility-Administered Medications for the  12/16/19 encounter (Office Visit) with Jose Mocha, MD  Medication  . sodium chloride flush (NS) 0.9 % injection 3 mL     Allergies:   Ciprofloxacin and Quinolones   Social History   Socioeconomic History  . Marital status: Divorced    Spouse name: Not on file  . Number of children: Not on file  . Years of education: Not on file  . Highest education level: Not on file  Occupational History  . Not on file  Tobacco Use  . Smoking status: Never Smoker  . Smokeless tobacco: Never Used  Substance and Sexual Activity  .  Alcohol use: Yes    Alcohol/week: 8.0 standard drinks    Types: 7 Glasses of wine, 1 Shots of liquor per week  . Drug use: No  . Sexual activity: Not on file  Other Topics Concern  . Not on file  Social History Narrative  . Not on file   Social Determinants of Health   Financial Resource Strain:   . Difficulty of Paying Living Expenses: Not on file  Food Insecurity:   . Worried About Charity fundraiser in the Last Year: Not on file  . Ran Out of Food in the Last Year: Not on file  Transportation Needs:   . Lack of Transportation (Medical): Not on file  . Lack of Transportation (Non-Medical): Not on file  Physical Activity:   . Days of Exercise per Week: Not on file  . Minutes of Exercise per Session: Not on file  Stress:   . Feeling of Stress : Not on file  Social Connections:   . Frequency of Communication with Friends and Family: Not on file  . Frequency of Social Gatherings with Friends and Family: Not on file  . Attends Religious Services: Not on file  . Active Member of Clubs or Organizations: Not on file  . Attends Archivist Meetings: Not on file  . Marital Status: Not on file     Family History: The patient's family history includes Diabetes in his brother; Heart disease in his father; Parkinson's disease in his brother and mother.  ROS:   Please see the history of present illness.    All other systems reviewed and are negative.  EKGs/Labs/Other Studies Reviewed:    The following studies were reviewed today: Cath: Conclusion  1. Moderate eccentric proxima/mid LAD stenosis 2. Severe stenosis of Jose [name] moderate sized first diagonal branch, not suitable for PCI  3. Patent left main, LCx, and RCA with mild nonobstructive plaque 4. Severely calcified aortic valve leaflets by plain fluoroscopy with Jose [name] 25 mmHg peak to peak gradient  Recommend: reviewed findings with Dr Jose Jose [name]. Will arrange TAVR CT studies and refer to Dr Jose Jose [name] who has been following the  patient for his dilated ascending aorta. Multidisciplinary valve team review of case.   Indications  Severe aortic stenosis [I35.0 (ICD-10-CM)]  Procedural Details  Technical Details INDICATION: Severe bicuspid aortic stenosis. Pt with 43 mm ascending aorta and progressive, now severe bicuspid aortic stenosis, referred for diagnostic right and left heart catheterization.  PROCEDURAL DETAILS: Right heart cath is attempted via right antecubital venous access. Jose [name] wire would not pass beyond the mid upper arm. Venography is preformed but I was not able to access Jose [name] central vein. The right wrist is prepped, draped, and anesthetized with 1% lidocaine. Using the modified Seldinger technique, Jose [name] 5/6 French Slender sheath is introduced into the right radial artery. 3 mg of verapamil is administered through the sheath,  weight-based unfractionated heparin was administered intravenously. Standard Judkins catheters are used for selective coronary angiography. LV pressure is recorded and an aortic valve pullback gradient is measured.  The procedure is technically difficult because of subclavian tortuosity. For the RCA injections, Jose [name] JR4 guide is used with an 0.035 wire in the catheter to provide support. Catheter exchanges are performed over an exchange length guidewire. There are no immediate procedural complications. Jose [name] TR band is used for radial hemostasis at the completion of the procedure.  The patient was transferred to the post catheterization recovery area for further monitoring.    Estimated blood loss <50 mL.   During this procedure medications were administered to achieve and maintain moderate conscious sedation while the patient's heart rate, blood pressure, and oxygen saturation were continuously monitored and I was present face-to-face 100% of this time.   Coronary Findings  Diagnostic Dominance: Right Left Main  The vessel exhibits minimal luminal irregularities.  Left Anterior Descending  Mid LAD  lesion 60% stenosed  Mid LAD lesion is 60% stenosed. The lesion is eccentric.  First Diagonal Branch  1st Diag-1 lesion 75% stenosed  1st Diag-1 lesion is 75% stenosed.  1st Diag-2 lesion 75% stenosed  1st Diag-2 lesion is 75% stenosed.  Second Diagonal Branch  2nd Diag lesion 50% stenosed  2nd Diag lesion is 50% stenosed.  Left Circumflex  The vessel exhibits minimal luminal irregularities.  Right Coronary Artery  Prox RCA to Mid RCA lesion 35% stenosed  Prox RCA to Mid RCA lesion is 35% stenosed.  Intervention  No interventions have been documented. Coronary Diagrams  Diagnostic Dominance: Right  Intervention  Implants   No implant documentation for this case.  Syngo Images  Show images for CARDIAC CATHETERIZATION  Images on Long Term Storage  Show images for Jose Jose [name], Jose Jose [name]   Link to Procedure Log  Procedure Log    Hemo Data   Most Recent Value  AO Systolic Pressure [id number] mmHg  AO Diastolic Pressure 70 mmHg  AO Mean 88 mmHg  LV Systolic Pressure 0000000 mmHg  LV Diastolic Pressure 7 mmHg  LV EDP 15 mmHg  AOp Systolic Pressure XX123456 mmHg  AOp Diastolic Pressure 64 mmHg  AOp Mean Pressure 83 mmHg  LVp Systolic Pressure 0000000 mmHg  LVp Diastolic Pressure 2 mmHg  LVp EDP Pressure 29 mmHg   Cardiac CTA: FINDINGS: Aortic Valve: Bicuspid and calcified with restricted motion. Appears to be large right and left cusps with no raphe  Aorta: Mild to moderate fusiform dilatation normal arch vessels no coarctation  Sino-tubular Junction: 34 mm  Ascending Thoracic Aorta: 43 mm  Aortic Arch: 36 mm  Descending Thoracic Aorta: 24 mm  Sinus of Valsalva Measurements: Bicuspid valve with right cusp including RCA ostia and left including LM ostia Long axis diameter 44 mm minor axis dimension 30 mm  Coronary Artery Height above Annulus:  Left Main: 16.7 mm above annulus  Right Coronary: 16.4 mm above annulus  Virtual Basal Annulus Measurements:   Maximum / Minimum Diameter: 26.6 mm x 23.5 mm  Perimeter:  --  Area: 479 mm 2  Coronary Arteries: Sufficient height above annulus for deployment  Optimum Fluoroscopic Angle for Delivery: LAO 16 Cranial 3 degrees  IMPRESSION: 1. Bicuspid AV with annular area of 479 mm 2 suitable for Jose [name] 26 mm Sapien 3 valve  2. Fusiform dilatation of the ascending aortic root at 4.3 cm In setting of bicuspid AV this will have to be taken into consideration regarding choice of SAVR  vs TAVR  3.  Coronary arteries sufficient height above annulus for deployment  4. Optimum angiographic angle for deployment LAO 16 Cranial 3 degrees  CTA Chest/Abdomen/Pelvis: VASCULAR MEASUREMENTS PERTINENT TO TAVR:  AORTA:  Minimal Aortic Diameter-13.1 x 13.0 mm  Severity of Aortic Calcification-moderate  RIGHT PELVIS:  Right Common Iliac Artery -  Minimal Diameter-8.7 x 8.6 mm  Tortuosity-severe  Calcification-mild  Right External Iliac Artery -  Minimal Diameter-8.0 x 7.7 mm  Tortuosity-mild-to-moderate  Calcification-none  Right Common Femoral Artery -  Minimal Diameter-7.4 x 7.4 mm  Tortuosity-none  Calcification-none  LEFT PELVIS:  Left Common Iliac Artery -  Minimal Diameter-8.7 x 6.6 mm  Tortuosity-moderate  Calcification-moderate  Left External Iliac Artery -  Minimal Diameter-7.4 x 7.3 mm  Tortuosity-mild  Calcification-none  Left Common Femoral Artery -  Minimal Diameter-7.3 x 6.3 mm  Tortuosity-mild  Calcification-mild-to-moderate  Review of the MIP images confirms the above findings.  IMPRESSION: 1. Vascular findings and measurements pertinent to potential TAVR procedure, as detailed. 2. Diffuse thickening and calcification of the aortic valve, compatible with the reported history of severe symptomatic aortic stenosis. 3. Mild cardiomegaly. 4. Stable 4.5 cm ascending thoracic aortic aneurysm. Ascending thoracic  aortic aneurysm. Recommend semi-annual imaging followup by CTA or MRA and referral to cardiothoracic surgery if not already obtained. This recommendation follows 2010 ACCF/AHA/AATS/ACR/ASA/SCA/SCAI/SIR/STS/SVM Guidelines for the Diagnosis and Management of Patients With Thoracic Aortic Disease. Circulation. 2010; [id number]. Aortic aneurysm NOS (ICD10-I71.9). 5. Moderate sigmoid diverticulosis. 6. Mild prostatomegaly. 7.  Aortic Atherosclerosis (ICD10-I70.0).  EKG:  EKG is not ordered today.  EKG from 09/26/2019 demonstrates normal sinus rhythm 63 bpm, within normal limits.  Recent Labs: 09/26/2019: BUN 22; Creatinine, Ser 1.04; Hemoglobin 13.2; Platelets 207; Potassium 4.7; Sodium 140 11/05/2019: ALT 18  Recent Lipid Panel    Component Value Date/Time   CHOL 169 11/05/2019 1026   TRIG 56 11/05/2019 1026   HDL 76 11/05/2019 1026   CHOLHDL 2.2 11/05/2019 1026   CHOLHDL 3.7 CALC 12/25/2007 1636   VLDL 25 12/25/2007 1636   LDLCALC 82 11/05/2019 1026   LDLDIRECT 164.4 12/25/2007 1636    Physical Exam:    VS:  BP 122/78   Pulse 79   Ht 6' (1.829 m)   Wt 209 lb 12.8 oz (95.2 kg)   SpO2 99%   BMI 28.45 kg/m     Wt Readings from Last 3 Encounters:  12/16/19 209 lb 12.8 oz (95.2 kg)  11/20/19 203 lb (92.1 kg)  11/19/19 194 lb (88 kg)     GEN: Well nourished, well developed in no acute distress HEENT: Normal NECK: No JVD; No carotid bruits LYMPHATICS: No lymphadenopathy CARDIAC: RRR, 2/6 harsh systolic murmur at the RUSB, no diastolic murmur RESPIRATORY:  Clear to auscultation without rales, wheezing or rhonchi  ABDOMEN: Soft, non-tender, non-distended MUSCULOSKELETAL:  No edema; No deformity  SKIN: Warm and dry NEUROLOGIC:  Alert and oriented x 3 PSYCHIATRIC:  Normal affect   STS Risk Calculator: Procedure: AVR + CAB   Risk of Mortality: 0.841%  Renal Failure: 0.856%  Permanent Stroke: 1.068%  Prolonged Ventilation: 3.607%  DSW Infection: 0.115%   Reoperation: 3.670%  Morbidity or Mortality: 6.957%  Short Length of Stay: 56.174%  Long Length of Stay: 2.618%   ASSESSMENT:    1. Aortic stenosis, severe    PLAN:    In order of problems listed above:  This patient has severe, stage C aortic stenosis with progressive increase in transaortic gradient now clearly in the severe  range. He has NYHA class I symptoms. Cardiac catheterization, echo, and CTA images are personally reviewed today.  The patient has Jose [name] known bicuspid valve with associated severe stenosis, mean gradient increased now to 45 mmHg as outlined above.  He has nonobstructive coronary artery disease with the exception of Jose [name] severely diseased diagonal branch appropriate for medical therapy in an absence of angina.  The iliac arteries are tortuous by CTA, but the lumen size is adequate and the vessels are noncalcified.  I would anticipate that these vessels will straighten with Jose [name] stiff wire and be suitable for placement of Jose [name] large-bore arterial sheath.  I have reviewed the natural history of aortic stenosis with the patient today. We have discussed the limitations of medical therapy and the poor prognosis associated with symptomatic aortic stenosis. We have reviewed potential treatment options, including palliative medical therapy, conventional surgical aortic valve replacement, and transcatheter aortic valve replacement. We discussed treatment options in the context of the patient's specific comorbid medical conditions.  I agree that proceeding with TAVR is Jose [name] reasonable treatment option in this patient.  The size of his ascending aortic aneurysm is unchanged over time.  He understands this could potentially increase in size over the next several years and would require surgical treatment if there is significant enlargement in the future.  We reviewed all of the potential risks associated with the TAVR procedure today in detail.  I demonstrated Jose [name] procedural animation of the TAVR surgery  with him.  We discussed expectations for recovery.  He understands the risks of vascular injury, major bleeding, stroke, heart attack, cardiac injury, cardiac tamponade, emergency surgery, valve embolization, late valve deterioration, prosthetic valve endocarditis, will occur at low frequency with 30-day risk less than 2%.  He understands the risk of severe conduction abnormality requiring permanent pacemaker placement is approximately 6 to 8%.  He wishes to proceed as planned next week.  Medication Adjustments/Labs and Tests Ordered: Current medicines are reviewed at length with the patient today.  Concerns regarding medicines are outlined above.  No orders of the defined types were placed in this encounter.  No orders of the defined types were placed in this encounter.   There are no Patient Instructions on file for this visit.   Signed, Jose Mocha, MD  12/16/2019 4:48 PM    [place]

## 2019-12-17 ENCOUNTER — Other Ambulatory Visit: Payer: Self-pay

## 2019-12-17 ENCOUNTER — Encounter: Payer: Self-pay | Admitting: Physical Therapy

## 2019-12-17 ENCOUNTER — Ambulatory Visit: Payer: Medicare Other | Attending: Cardiovascular Disease | Admitting: Physical Therapy

## 2019-12-17 DIAGNOSIS — R2689 Other abnormalities of gait and mobility: Secondary | ICD-10-CM

## 2019-12-17 NOTE — Pre-Procedure Instructions (Signed)
[id number] [address], [place] [address] Phone: [phone] Fax: [phone]  [id number] [place], [place] [address] [place] [address] Phone: [phone] Fax: [phone]    Your procedure is scheduled on Tues., Jan. 5, 2020 from 7:30AM-9:50AM  Report to [facility] "[facility]" at 5:30AM  Call this number if you have problems the morning of surgery:  [phone]   Remember:  Do not eat or drink after midnight on Jan. 4th    Take these medicines the morning of surgery with A SIP OF WATER: NONE  As of today, stop taking all Aspirin (unless instructed by your doctor) and Other Aspirin containing products, Vitamins, Fish oils, and Herbal medications. Also stop all NSAIDS i.e. Advil, Ibuprofen, Motrin, Aleve, Anaprox, Naproxen, BC, Goody Powders, and all Supplements.  No Smoking of any kind, Tobacco, or Alcohol products 24 hours prior to your procedure. If you use a Cpap at night, you may bring the machine and all equipment for your overnight stay.   Special instructions:   - Preparing For Surgery  Before surgery, you can play an important role. Because skin is not sterile, your skin needs to be as free of germs as possible. You can reduce the number of germs on your skin by washing with CHG (chlorahexidine gluconate) Soap before surgery.  CHG is an antiseptic cleaner which kills germs and bonds with the skin to continue killing germs even after washing.    Please do not use if you have an allergy to CHG or antibacterial soaps. If your skin becomes reddened/irritated stop using the CHG.  Do not shave (including legs and underarms) for at least 48 hours prior to first CHG shower. It is OK to shave your face.  Please follow these instructions carefully.   1. Shower the NIGHT BEFORE SURGERY and the MORNING OF SURGERY with CHG.   2. If you chose to wash  your hair, wash your hair first as usual with your normal shampoo.  3. After you shampoo, rinse your hair and body thoroughly to remove the shampoo.  4. Use CHG as you would any other liquid soap. You can apply CHG directly to the skin and wash gently with a scrungie or a clean washcloth.   5. Apply the CHG Soap to your body ONLY FROM THE NECK DOWN.  Do not use on open wounds or open sores. Avoid contact with your eyes, ears, mouth and genitals (private parts). Wash Face and genitals (private parts)  with your normal soap.  6. Wash thoroughly, paying special attention to the area where your surgery will be performed.  7. Thoroughly rinse your body with warm water from the neck down.  8. DO NOT shower/wash with your normal soap after using and rinsing off the CHG Soap.  9. Pat yourself dry with a CLEAN TOWEL.  10. Wear CLEAN PAJAMAS to bed the night before surgery, wear comfortable clothes the morning of surgery  11. Place CLEAN SHEETS on your bed the night of your first shower and DO NOT SLEEP WITH PETS.   Day of Surgery:            Remember to brush your teeth WITH YOUR REGULAR TOOTHPASTE.  Do not wear jewelry.  Do not wear lotions, powders, colognes, or deodorant.  Do not shave 48 hours prior to surgery.  Men may shave face and neck.  Do not bring valuables  to the hospital.  [facility] is not responsible for any belongings or valuables.  Contacts, dentures or bridgework may not be worn into surgery.    For patients admitted to the hospital, discharge time will be determined by your treatment team.  Patients discharged the day of surgery will not be allowed to drive home, and someone age 39 and over needs to stay with them for 24 hours.   Please wear clean clothes to the hospital/surgery center.    Please read over the following fact sheets that you were given.

## 2019-12-17 NOTE — Therapy (Signed)
[place], [place], [address] Phone: [phone]   Fax:  [phone]  Physical Therapy Evaluation  Patient Details  Name: Jose [name] MRN: [id number] Date of Birth: Apr 18, 1951 Referring Provider (PT): Dr. Sherren Mocha   Encounter Date: 12/17/2019  PT End of Session - 12/17/19 0902    Visit Number  1    PT Start Time  0901    PT Stop Time  [id number]    PT Time Calculation (min)  33 min       Past Medical History:  Diagnosis Date  . Arthritis    R shoulder, great toes, hands- has gout & has injections in knees with Dr. Estanislado Pandy   . Bicuspid aortic valve with ascending aorta 4.0 to 4.5 cm in diameter   . Cancer ([place])    skin ca-   . GERD (gastroesophageal reflux disease)   . History of hiatal hernia   . Hypertension   . SDH (subdural hematoma) ([place])   . Severe aortic stenosis   . Vertebral artery aneurysm [facility])     Past Surgical History:  Procedure Laterality Date  . BRAIN SURGERY  2016   evacuation of SDH  . CARPAL TUNNEL RELEASE Bilateral   . GREEN LIGHT LASER TURP (TRANSURETHRAL RESECTION OF PROSTATE  04/27/2017  . KNEE ARTHROPLASTY    . LEFT HEART CATH AND CORONARY ANGIOGRAPHY N/A 10/08/2019   Procedure: LEFT HEART CATH AND CORONARY ANGIOGRAPHY;  Surgeon: Sherren Mocha, MD;  Location: [place] [facility];  Service: Cardiovascular;  Laterality: N/A;  . QUADRICEPS TENDON REPAIR Bilateral 06/15/2017   Procedure: REPAIR QUADRICEP TENDON;  Surgeon: Meredith Pel, MD;  Location: [place];  Service: Orthopedics;  Laterality: Bilateral;  . SHOULDER SURGERY Right     There were no vitals filed for this visit.   Subjective Assessment - 12/17/19 0905    Subjective  PT presents to OP PT eval prior to TAVR procedure scheduled for 12/23/2019. Reports not limited by cardiac symptoms right now but from limitations related quadriceps tendor repair.    Patient Stated Goals  fix heart          [facility] PT Assessment - 12/17/19 0001      Assessment   Medical Diagnosis  severe aortic stenosis    Referring Provider (PT)  Dr. Sherren Mocha      Precautions   Precautions  None      Restrictions   Weight Bearing Restrictions  No      Balance Screen   Has the patient fallen in the past 6 months  No    Has the patient had a decrease in activity level because of a fear of falling?   No    Is the patient reluctant to leave their home because of a fear of falling?   No      Home Social worker  Private residence    Living Arrangements  Spouse/significant other      Prior Function   Level of Independence  Independent      ROM / Strength   AROM / PROM / Strength  AROM;Strength      AROM   Overall AROM Comments  grossly WNL      Strength   Overall Strength Comments  grossly 3/5 UE and 4/5 lower extremity    Strength Assessment Site  Hand    Right/Left hand  Right;Left    Right Hand Grip (lbs)  86  R hand dominant   Left Hand Grip (lbs)  55      Ambulation/Gait   Gait Comments  No significant deviations noted.       6 Minute Walk- Baseline   6 Minute Walk- Baseline  yes    BP (mmHg)  137/76    HR (bpm)  82    02 Sat (%RA)  96 %    Modified Borg Scale for Dyspnea  0- Nothing at all    Perceived Rate of Exertion (Borg)  6-       [facility] Pre-Surgical Assessment - 12/17/19 0001    4 Stage Balance Test tolerated for:   10 sec.    4 Stage Balance Test Position  4    Sit To Stand Test- trial 1  11 sec.    ADL/IADL Independent with:  Bathing;Dressing;Meal prep;Yard work;Finances    6 Minute Walk Post Test  yes    BP (mmHg)  (!) 126/101    HR (bpm)  105    02 Sat (%RA)  98 %    Modified Borg Scale for Dyspnea  0.5- Very, very slight shortness of breath    Perceived Rate of Exertion (Borg)  13- Somewhat hard    Aerobic Endurance Distance Walked  1210    Endurance additional comments  Limited by 30% for age/gender               Objective measurements completed on examination: See above findings.                           Plan - 12/17/19 0940    Clinical Impression Statement  see below    PT Frequency  One time visit      Clinical Impression Statement: Pt is a 68 yo male presenting to OP PT for evaluation prior to possible TAVR surgery due to severe aortic stenosis. Pt's functional mobility has been recently mostly limited by impairments from L quad tendon repair in July 2020. Pt presents with fair ROM and strength with limitations primarily bil. shoulders, good balance and is not at high fall risk 4 stage balance test, good walking speed and good aerobic endurance per 6 minute walk test. Pt ambulated a total of 1210 feet in 6 minute walk. Diastolic BP increased and Systolic dropped with 6 minute walk test Pt reported very slight SOB but attributed this to mask. Based on the Short Physical Performance Battery, patient has a frailty rating of 12/12 with </= 5/12 considered frail.   Patient will benefit from skilled therapeutic intervention in order to improve the following deficits and impairments:     Visit Diagnosis: Other abnormalities of gait and mobility     Problem List Patient Active Problem List   Diagnosis Date Noted  . Hypertension   . Severe aortic stenosis 10/08/2019  . Candidiasis   . Abnormality of gait   . Hypoalbuminemia due to protein-calorie malnutrition ([place])   . History of gout   . Sleep disturbance   . Constipation   . Rupture of quadriceps tendon, right, sequela 06/19/2017  . Quadriceps tendon rupture, left, sequela 06/19/2017  . Acute blood loss anemia 06/19/2017  . Fall   . History of subdural hematoma   . Post-operative pain   . Recurrent falls   . Quadriceps tendon rupture 06/15/2017  . Primary osteoarthritis of both knees 02/28/2017  . Primary osteoarthritis of both hands 02/28/2017  . Uricacidemia 02/28/2017  . Pain in  joint of left  knee 02/07/2017  . Idiopathic chronic gout, unspecified site, without tophus (tophi) 11/29/2016  . HEMATURIA UNSPECIFIED 01/25/2009  . ABDOMINAL PAIN 01/25/2009  . XERODERMA 03/10/2008  . UNS ADVRS EFF UNS RX MEDICINAL&BIOLOGICAL SBSTNC 03/10/2008  . ADJUSTMENT REACTION WITH PHYSICAL SYMPTOMS 02/14/2008  . MUSCLE SPASM 02/14/2008  . ACUTE PROSTATITIS 12/16/2007  . BREAST LUMP OR MASS, RIGHT 07/12/2007  . H/O: RCT (rotator cuff tear) 01/13/2007  . GOUT 01/08/2007    Romualdo Bolk, PT, DPT 12/17/2019, 9:41 AM  [facility] [address] [place], [place], [address] Phone: [phone]   Fax:  [phone]  Name: LUCIOUS ARTRIP MRN: [id number] Date of Birth: March 10, 1951

## 2019-12-18 ENCOUNTER — Encounter (HOSPITAL_COMMUNITY)
Admission: RE | Admit: 2019-12-18 | Discharge: 2019-12-18 | Disposition: A | Discharge status: Home / Self Care | Payer: Medicare Other | Source: Ambulatory Visit | Attending: Cardiovascular Disease | Admitting: Cardiovascular Disease

## 2019-12-18 ENCOUNTER — Ambulatory Visit (HOSPITAL_COMMUNITY)
Admission: RE | Admit: 2019-12-18 | Discharge: 2019-12-18 | Disposition: A | Discharge status: Home / Self Care | Payer: Medicare Other | Source: Ambulatory Visit | Attending: Cardiovascular Disease | Admitting: Cardiovascular Disease

## 2019-12-18 ENCOUNTER — Other Ambulatory Visit (HOSPITAL_COMMUNITY): Payer: Medicare Other

## 2019-12-18 ENCOUNTER — Encounter (HOSPITAL_COMMUNITY): Payer: Self-pay

## 2019-12-18 ENCOUNTER — Ambulatory Visit (HOSPITAL_COMMUNITY)
Admission: RE | Admit: 2019-12-18 | Discharge: 2019-12-18 | Disposition: A | Discharge status: Home / Self Care | Payer: Medicare Other | Source: Ambulatory Visit | Attending: Physician Assistant | Admitting: Physician Assistant

## 2019-12-18 ENCOUNTER — Ambulatory Visit: Payer: Medicare Other | Admitting: Physical Therapy

## 2019-12-18 ENCOUNTER — Other Ambulatory Visit: Payer: Self-pay

## 2019-12-18 DIAGNOSIS — I350 Nonrheumatic aortic (valve) stenosis: Secondary | ICD-10-CM | POA: Insufficient documentation

## 2019-12-18 DIAGNOSIS — Z01818 Encounter for other preprocedural examination: Secondary | ICD-10-CM | POA: Diagnosis present

## 2019-12-18 DIAGNOSIS — Z01811 Encounter for preprocedural respiratory examination: Secondary | ICD-10-CM

## 2019-12-18 DIAGNOSIS — Z79899 Other long term (current) drug therapy: Secondary | ICD-10-CM | POA: Insufficient documentation

## 2019-12-18 DIAGNOSIS — I2510 Atherosclerotic heart disease of native coronary artery without angina pectoris: Secondary | ICD-10-CM | POA: Diagnosis not present

## 2019-12-18 DIAGNOSIS — I10 Essential (primary) hypertension: Secondary | ICD-10-CM | POA: Insufficient documentation

## 2019-12-18 DIAGNOSIS — K219 Gastro-esophageal reflux disease without esophagitis: Secondary | ICD-10-CM | POA: Insufficient documentation

## 2019-12-18 HISTORY — DX: Headache, unspecified: R51.9

## 2019-12-18 LAB — BLOOD GAS, ARTERIAL
Acid-Base Excess: 1.2 mmol/L (ref 0.0–2.0)
Bicarbonate: 25.1 mmol/L (ref 20.0–28.0)
Drawn by: 421801
FIO2: 21
O2 Saturation: 97.7 %
Patient temperature: 37
pCO2 arterial: 38.4 mmHg (ref 32.0–48.0)
pH, Arterial: 7.431 (ref 7.350–7.450)
pO2, Arterial: 102 mmHg (ref 83.0–108.0)

## 2019-12-18 LAB — URINALYSIS, ROUTINE W REFLEX MICROSCOPIC
Bilirubin Urine: NEGATIVE
Glucose, UA: NEGATIVE mg/dL
Hgb urine dipstick: NEGATIVE
Ketones, ur: NEGATIVE mg/dL
Leukocytes,Ua: NEGATIVE
Nitrite: NEGATIVE
Protein, ur: NEGATIVE mg/dL
Specific Gravity, Urine: 1.015 (ref 1.005–1.030)
pH: 6 (ref 5.0–8.0)

## 2019-12-18 LAB — PROTIME-INR
INR: 0.9 (ref 0.8–1.2)
Prothrombin Time: 12.1 seconds (ref 11.4–15.2)

## 2019-12-18 LAB — COMPREHENSIVE METABOLIC PANEL
ALT: 31 U/L (ref 0–44)
AST: 27 U/L (ref 15–41)
Albumin: 3.9 g/dL (ref 3.5–5.0)
Alkaline Phosphatase: 65 U/L (ref 38–126)
Anion gap: 11 (ref 5–15)
BUN: 20 mg/dL (ref 8–23)
CO2: 22 mmol/L (ref 22–32)
Calcium: 9.1 mg/dL (ref 8.9–10.3)
Chloride: 108 mmol/L (ref 98–111)
Creatinine, Ser: 1.13 mg/dL (ref 0.61–1.24)
GFR calc Af Amer: >60 mL/min (ref >60)
GFR calc non Af Amer: >60 mL/min (ref >60)
Glucose, Bld: 102 mg/dL — ABNORMAL HIGH (ref 70–99)
Potassium: 4.2 mmol/L (ref 3.5–5.1)
Sodium: 141 mmol/L (ref 135–145)
Total Bilirubin: 0.5 mg/dL (ref 0.3–1.2)
Total Protein: 6.8 g/dL (ref 6.5–8.1)

## 2019-12-18 LAB — CBC
HCT: 44.4 % (ref 39.0–52.0)
Hemoglobin: 14.7 g/dL (ref 13.0–17.0)
MCH: 31.3 pg (ref 26.0–34.0)
MCHC: 33.1 g/dL (ref 30.0–36.0)
MCV: 94.7 fL (ref 80.0–100.0)
Platelets: 188 10*3/uL (ref 150–400)
RBC: 4.69 MIL/uL (ref 4.22–5.81)
RDW: 14.3 % (ref 11.5–15.5)
WBC: 7.1 10*3/uL (ref 4.0–10.5)
nRBC: 0 % (ref 0.0–0.2)

## 2019-12-18 LAB — TYPE AND SCREEN
ABO/RH(D): A POS
Antibody Screen: NEGATIVE

## 2019-12-18 LAB — BRAIN NATRIURETIC PEPTIDE: B Natriuretic Peptide: 42.1 pg/mL (ref 0.0–100.0)

## 2019-12-18 LAB — SURGICAL PCR SCREEN
MRSA, PCR: POSITIVE — AB
Staphylococcus aureus: POSITIVE — AB

## 2019-12-18 LAB — APTT: aPTT: 35 seconds (ref 24–36)

## 2019-12-18 LAB — HEMOGLOBIN A1C
Hgb A1c MFr Bld: 5.3 % (ref 4.8–5.6)
Mean Plasma Glucose: 105.41 mg/dL

## 2019-12-18 LAB — ABO/RH: ABO/RH(D): A POS

## 2019-12-18 NOTE — Progress Notes (Signed)
PCP - Dr. Melissa Montane Cardiologist - Dr. Johnsie Cancel  PPM/ICD - Denies  Chest x-ray - 12/18/2019 EKG - 12/18/2019 Stress Test - 02/21/2017 ECHO - 09/26/2019 Cardiac Cath - 10/08/2019  Sleep Study - Denies  Patient denies being diabetic.   Blood Thinner Instructions: N/A Aspirin Instructions: N/A   COVID TEST- Scheduled 12/22/2019   Anesthesia review: Yes, cardiac hx   Coronavirus Screening  Have you experienced the following symptoms:  Cough yes/no: No Fever (>100.63F)  yes/no: No Runny nose yes/no: No Sore throat yes/no: No Difficulty breathing/shortness of breath  yes/no: No  Have you or a family member traveled in the last 14 days and where? yes/no: No   If the patient indicates "YES" to the above questions, their PAT will be rescheduled to limit the exposure to others and, the surgeon will be notified. THE PATIENT WILL NEED TO BE ASYMPTOMATIC FOR 14 DAYS.   If the patient is not experiencing any of these symptoms, the PAT nurse will instruct them to NOT bring anyone with them to their appointment since they may have these symptoms or traveled as well.   Please remind your patients and families that hospital visitation restrictions are in effect and the importance of the restrictions.    Patient denies shortness of breath, fever, cough and chest pain at PAT appointment   All instructions explained to the patient, with a verbal understanding of the material. Patient agrees to go over the instructions while at home for a better understanding. Patient also instructed to self quarantine after being tested for COVID-19. The opportunity to ask questions was provided.

## 2019-12-19 ENCOUNTER — Other Ambulatory Visit: Payer: Self-pay | Admitting: Physician Assistant

## 2019-12-19 DIAGNOSIS — Z952 Presence of prosthetic heart valve: Secondary | ICD-10-CM

## 2019-12-20 ENCOUNTER — Other Ambulatory Visit (HOSPITAL_COMMUNITY): Payer: Medicare Other

## 2019-12-22 ENCOUNTER — Other Ambulatory Visit (HOSPITAL_COMMUNITY)
Admission: RE | Admit: 2019-12-22 | Discharge: 2019-12-22 | Disposition: A | Discharge status: Home / Self Care | Payer: Medicare Other | Source: Ambulatory Visit | Attending: Cardiovascular Disease | Admitting: Cardiovascular Disease

## 2019-12-22 LAB — SARS CORONAVIRUS 2 (TAT 6-24 HRS): SARS Coronavirus 2: NEGATIVE

## 2019-12-22 MED ORDER — SODIUM CHLORIDE 0.9 % IV SOLN
INTRAVENOUS | Status: DC
  Filled 2019-12-22: qty 30

## 2019-12-22 MED ORDER — POTASSIUM CHLORIDE 2 MEQ/ML IV SOLN
80.0000 meq | INTRAVENOUS | Status: DC
  Filled 2019-12-22: qty 40

## 2019-12-22 MED ORDER — MAGNESIUM SULFATE 50 % IJ SOLN
40.0000 meq | INTRAMUSCULAR | Status: DC
  Filled 2019-12-22: qty 9.85

## 2019-12-22 MED ORDER — SODIUM CHLORIDE 0.9 % IV SOLN
1.5000 g | INTRAVENOUS | Status: DC
  Filled 2019-12-22: qty 1.5

## 2019-12-22 MED ORDER — NOREPINEPHRINE 4 MG/250ML-% IV SOLN
0.0000 ug/min | INTRAVENOUS | Status: DC
  Filled 2019-12-22: qty 250

## 2019-12-22 MED ORDER — VANCOMYCIN HCL 1500 MG/300ML IV SOLN
1500.0000 mg | INTRAVENOUS | Status: AC
  Administered 2019-12-23: 08:00:00 1500 mg via INTRAVENOUS
  Filled 2019-12-22 (×2): qty 300

## 2019-12-22 MED ORDER — DEXMEDETOMIDINE HCL IN NACL 400 MCG/100ML IV SOLN
0.1000 ug/kg/h | INTRAVENOUS | Status: DC
  Filled 2019-12-22: qty 100

## 2019-12-22 NOTE — H&P (Signed)
[address]       [place] [address]             [phone]      Cardiothoracic Surgery Admission History and Physical    Referring Provider is Grace Isaac, MD  Primary Cardiologist is [name], MD  PCP is [name], MD   HPI:   The patient is a 69 year old fairly healthy gentleman with no prior cardiac history who reportedly had an intracranial bleed without history of trauma treated in [place] and requiring craniotomy in July 2016. 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 He has been followed by Dr. Servando Snare for an ascending aortic aneurysm. 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 There is nonobstructive coronary disease in the proximal and mid LAD. His most recent echocardiogram on 09/26/2019 showed a bicuspid aortic valve with severe aortic stenosis with a mean gradient of 45 mmHg and a peak gradient of 69 mmHg. This had significantly progressed compared to his prior echo in March 2021 the aortic valve mean gradient was 31 mmHg. He subsequently underwent cardiac catheterization which showed moderate eccentric proximal and mid LAD stenosis estimated at 50 to 60%. There was a severe stenosis of a moderate size first diagonal branch that was not felt to be suitable for PCI.  He denies any chest pain or pressure. He has had no shortness of breath, fatigue, orthopnea, or dizziness. His activities have been somewhat limited due to recovering from redo quadricep surgery on the left. He said last year he had bilateral quadriceps repair.  He was seen by Dr. Servando Snare on 11/20/2019 and was not interested in open surgical AVR +/-replacement of the ascending aorta but said that he would agree to transcatheter aortic valve replacement.        Current Outpatient Medications  Medication Sig Dispense Refill    . acetaminophen (TYLENOL) 500 MG tablet Take 500-1,000 mg by mouth every 6 (six) hours as needed for mild pain.    Marland Kitchen allopurinol (ZYLOPRIM) 300 MG tablet TAKE 1 TABLET BY MOUTH EVERY DAY 90 tablet 0  . atorvastatin (LIPITOR) 20 MG tablet Take 1 tablet (20 mg total) by mouth daily at 6 PM. 90 tablet 3  . esomeprazole (NEXIUM) 40 MG capsule Take 1 capsule (40 mg total) by mouth daily. (Patient taking differently: Take 20 mg by mouth daily. ) 30 capsule 0  . metoprolol succinate (TOPROL-XL) 25 MG 24 hr tablet Take 25 mg by mouth daily.    . Multiple Vitamin (MULTIVITAMIN WITH MINERALS) TABS tablet Take 1 tablet by mouth daily.              Current Facility-Administered Medications  Medication Dose Route Frequency Provider Last Rate Last Admin  . sodium chloride flush (NS) 0.9 % injection 3 mL 3 mL Intravenous Q12H Josue Hector, MD         Past Medical History:  Diagnosis Date  . Arthritis    R shoulder, great toes, hands- has gout & has injections in knees with Dr. Estanislado Pandy   . Cancer ([facility])    skin ca-   . GERD (gastroesophageal reflux disease)   . History of hiatal hernia   . SDH (subdural hematoma) ([facility])         Past Surgical History:  Procedure Laterality  Date  . BRAIN SURGERY  2016   evacuation of SDH  . CARPAL TUNNEL RELEASE Bilateral   . GREEN LIGHT LASER TURP (TRANSURETHRAL RESECTION OF PROSTATE  04/27/2017  . KNEE ARTHROPLASTY    . LEFT HEART CATH AND CORONARY ANGIOGRAPHY N/A 10/08/2019   Procedure: LEFT HEART CATH AND CORONARY ANGIOGRAPHY; Surgeon: Sherren Mocha, MD; Location: [facility]; Service: Cardiovascular; Laterality: N/A;  . QUADRICEPS TENDON REPAIR Bilateral 06/15/2017   Procedure: REPAIR QUADRICEP TENDON; Surgeon: Meredith Pel, MD; Location: [facility]; Service: Orthopedics; Laterality: Bilateral;  . SHOULDER SURGERY Right         Family History  Problem Relation Age of Onset  . Parkinson's disease Mother   . Heart disease Father   .  Parkinson's disease Brother   . Diabetes Brother    Social History        Socioeconomic History  . Marital status: Divorced    Spouse name: Not on file  . Number of children: Not on file  . Years of education: Not on file  . Highest education level: Not on file  Occupational History  . Not on file  Tobacco Use  . Smoking status: Never Smoker  . Smokeless tobacco: Never Used  Substance and Sexual Activity  . Alcohol use: Yes    Alcohol/week: 8.0 standard drinks    Types: 7 Glasses of wine, 1 Shots of liquor per week  . Drug use: No  . Sexual activity: Not on file  Other Topics Concern  . Not on file  Social History Narrative  . Not on file   Social Determinants of Health      Financial Resource Strain:   . Difficulty of Paying Living Expenses: Not on file  Food Insecurity:   . Worried About Charity fundraiser in the Last Year: Not on file  . Ran Out of Food in the Last Year: Not on file  Transportation Needs:   . Lack of Transportation (Medical): Not on file  . Lack of Transportation (Non-Medical): Not on file  Physical Activity:   . Days of Exercise per Week: Not on file  . Minutes of Exercise per Session: Not on file  Stress:   . Feeling of Stress : Not on file  Social Connections:   . Frequency of Communication with Friends and Family: Not on file  . Frequency of Social Gatherings with Friends and Family: Not on file  . Attends Religious Services: Not on file  . Active Member of Clubs or Organizations: Not on file  . Attends Archivist Meetings: Not on file  . Marital Status: Not on file  Intimate Partner Violence:   . Fear of Current or Ex-Partner: Not on file  . Emotionally Abused: Not on file  . Physically Abused: Not on file  . Sexually Abused: Not on file         Current Outpatient Medications  Medication Sig Dispense Refill  . acetaminophen (TYLENOL) 500 MG tablet Take 500-1,000 mg by mouth every 6 (six) hours as needed for mild pain.      Marland Kitchen allopurinol (ZYLOPRIM) 300 MG tablet TAKE 1 TABLET BY MOUTH EVERY DAY 90 tablet 0  . atorvastatin (LIPITOR) 20 MG tablet Take 1 tablet (20 mg total) by mouth daily at 6 PM. 90 tablet 3  . esomeprazole (NEXIUM) 40 MG capsule Take 1 capsule (40 mg total) by mouth daily. (Patient taking differently: Take 20 mg by mouth daily. ) 30 capsule 0  .  metoprolol succinate (TOPROL-XL) 25 MG 24 hr tablet Take 25 mg by mouth daily.    . Multiple Vitamin (MULTIVITAMIN WITH MINERALS) TABS tablet Take 1 tablet by mouth daily.              Current Facility-Administered Medications  Medication Dose Route Frequency Provider Last Rate Last Admin  . sodium chloride flush (NS) 0.9 % injection 3 mL 3 mL Intravenous Q12H Josue Hector, MD          Allergies  Allergen Reactions  . Ciprofloxacin Other (See Comments)    Patient states instructed not to take due to history of Aneurysms  . Quinolones     Patient was warned about not using Cipro and similar antibiotics.  Recent studies have raised concern that fluoroquinolone antibiotics could be associated with an increased risk of aortic aneurysm  Fluoroquinolones have non-antimicrobial properties that might jeopardise the integrity of the extracellular matrix of the vascular wall  In a propensity score matched cohort study in [place], there was a 66% increased rate of aortic aneurysm or dissection associated with oral fluoroquinolone use, compared wit   Review of Systems:   General: normal appetite, normal energy, no weight gain, no weight loss, no fever  Cardiac: no chest pain with exertion, no chest pain at rest, noSOB with no exertion, no resting SOB, no PND, no orthopnea, no palpitations, no arrhythmia, no atrial fibrillation, no LE edema, no dizzy spells, no syncope  Respiratory: no shortness of breath, no home oxygen, no productive cough, no dry cough, no bronchitis, no wheezing, no hemoptysis, no asthma, no pain with inspiration or cough, no sleep apnea,  no CPAP at night  GI: no difficulty swallowing, no reflux, no frequent heartburn, no hiatal hernia, no abdominal pain, no constipation, no diarrhea, no hematochezia, no hematemesis, no melena  GU: no dysuria, no frequency, no urinary tract infection, no hematuria, no enlarged prostate, no kidney stones, no kidney disease  Vascular: no pain suggestive of claudication, no pain in feet, no leg cramps, no varicose veins, no DVT, no non-healing foot ulcer  Neuro: no stroke, no TIA's, no seizures, no headaches, no temporary blindness one eye, no slurred speech, no peripheral neuropathy, no chronic pain, no instability of gait, no memory/cognitive dysfunction  Musculoskeletal: no arthritis, no joint swelling, no myalgias, no difficulty walking, normal mobility  Skin: no rash, no itching, no skin infections, no pressure sores or ulcerations  Psych: no anxiety, no depression, no nervousness, no unusual recent stress  Eyes: no blurry vision, no floaters, no recent vision changes, + wears glasses or contacts  ENT: no hearing loss, no loose or painful teeth, no dentures, last saw dentist this year  Hematologic: no easy bruising, no abnormal bleeding, no clotting disorder, no frequent epistaxis  Endocrine: no diabetes, does not check CBG's at home     PHYSICAL EXAMINATION: per [facility] consultation since my visit was virtual BP 128/89 (BP Location: Right Arm)   Pulse 72   Temp 97.7 F (36.5 C) (Skin)   Resp 20   Ht 6' (1.829 m)   Wt 92.1 kg   SpO2 95% Comment: RA  BMI 27.53 kg/m  General appearance: alert, cooperative and no distress Head: Normocephalic, without obvious abnormality, atraumatic Neck: no adenopathy, no carotid bruit, no JVD, supple, symmetrical, trachea midline and thyroid not enlarged, symmetric, no tenderness/mass/nodules Lymph nodes: Cervical, supraclavicular, and axillary nodes normal. Resp: clear to auscultation bilaterally Back: symmetric, no curvature. ROM normal. No CVA  tenderness. Cardio: regular rate  and rhythm, S1, S2 normal, early to mid 3/6 systolic murmur heard best along the left sternal border, click, rub or gallop GI: soft, non-tender; bowel sounds normal; no masses,  no organomegaly Extremities: extremities normal, atraumatic, no cyanosis or edema and Homans sign is negative, no sign of DVT Neurologic: Grossly normal   Diagnostic Tests:   Patient Name: Jose [name] Date of Exam: 09/26/2019  Medical Rec #: [id number] Height: 72.0 in  Accession #: [id number] Weight: 194.0 lb  Date of Birth: 07-09-1951 BSA: 2.10 m  Patient Age: 57 years BP: 127/86 mmHg  Patient Gender: M HR: 77 bpm.  Exam Location: [facility]  Procedure: 2D Echo, 3D Echo, Cardiac Doppler and Color Doppler  Indications: I35 Aortic stenosis  History: Patient has prior history of Echocardiogram examinations, most  recent 02/28/2019. Aortic Valve Disease and Bicuspid aortic  valve. Anemia.  Sonographer: Jessee Avers, RDCS  Referring Phys: [place]  1. Left ventricular ejection fraction, by visual estimation, is 55 to 60%. The left ventricle has normal function. Normal left ventricular size. Left ventricular septal wall thickness was mildly increased. There is mildly increased left ventricular  hypertrophy.  2. Left ventricular diastolic Doppler parameters are consistent with impaired relaxation pattern of LV diastolic filling.  3. Global right ventricle has normal systolic function.The right ventricular size is normal. No increase in right ventricular wall thickness.  4. Left atrial size was mildly dilated.  5. Right atrial size was normal.  6. Mild mitral annular calcification.  7. The mitral valve is normal in structure. Mild mitral valve regurgitation.  8. The tricuspid valve is normal in structure. Tricuspid valve regurgitation is mild.  9. The aortic valve is bicuspid Aortic valve regurgitation was not visualized by color flow Doppler. Severe  aortic valve stenosis.  10. Bicuspid AV with calcification and restricted motion Sinnce last echo AS now severe.  11. The pulmonic valve was grossly normal. Pulmonic valve regurgitation is mild by color flow Doppler.  12. Aortic dilatation noted.  13. There is moderate dilatation of the ascending aorta measuring 44 mm.  14. Mildly elevated pulmonary artery systolic pressure.  In comparison to the previous echocardiogram(s): 02/28/19 EF 55-60%. Ascending aorta dimension 47mm. Moderate AS 91mmHg peak, 8mmHg mean.  FINDINGS  Left Ventricle: Left ventricular ejection fraction, by visual estimation, is 55 to 60%. The left ventricle has normal function. Left ventricular septal wall thickness was mildly increased. There is mildly increased left ventricular hypertrophy. Normal  left ventricular size. Spectral Doppler shows Left ventricular diastolic Doppler parameters are consistent with impaired relaxation pattern of LV diastolic filling.  Right Ventricle: The right ventricular size is normal. No increase in right ventricular wall thickness. Global RV systolic function is has normal systolic function. The tricuspid regurgitant velocity is 2.77 m/s, and with an assumed right atrial pressure  of 8 mmHg, the estimated right ventricular systolic pressure is mildly elevated at 38.7 mmHg.  Left Atrium: Left atrial size was mildly dilated.  Right Atrium: Right atrial size was normal in size  Pericardium: There is no evidence of pericardial effusion.  Mitral Valve: The mitral valve is normal in structure. There is mild thickening of the mitral valve leaflet(s). There is mild calcification of the mitral valve leaflet(s). Mild mitral annular calcification. Mild mitral valve regurgitation.  Tricuspid Valve: The tricuspid valve is normal in structure. Tricuspid valve regurgitation is mild by color flow Doppler.  Aortic Valve: The aortic valve is bicuspid. Aortic valve regurgitation was not  visualized by color flow  Doppler. Severe aortic stenosis is present. Aortic valve mean gradient measures 45.0 mmHg. Aortic valve peak gradient measures 68.9 mmHg. Aortic valve  area, by VTI measures 0.74 cm. Bicuspid AV with calcification and restricted motion Sinnce last echo AS now severe.  Pulmonic Valve: The pulmonic valve was grossly normal. Pulmonic valve regurgitation is mild by color flow Doppler.  Aorta: Aortic dilatation noted. There is moderate dilatation of the ascending aorta measuring 44 mm.  IAS/Shunts: No atrial level shunt detected by color flow Doppler.  LEFT VENTRICLE  PLAX 2D  LVIDd: 4.70 cm Diastology  LVIDs: 3.30 cm LV e' lateral: 8.40 cm/s  LV PW: 0.90 cm LV E/e' lateral: 7.1  LV IVS: 1.00 cm LV e' medial: 5.55 cm/s  LVOT diam: 2.10 cm LV E/e' medial: 10.8  LV SV: 58 ml  LV SV Index: 27.39  LVOT Area: 3.46 cm  3D Volume EF:  3D EF: 60 %  LV EDV: 135 ml  LV ESV: 55 ml  LV SV: 81 ml  RIGHT VENTRICLE  RV Basal diam: 4.70 cm  RV Mid diam: 3.50 cm  RV S prime: 8.48 cm/s  RVSP: 38.7 mmHg  LEFT ATRIUM Index RIGHT ATRIUM Index  LA diam: 4.10 cm 1.95 cm/m RA Pressure: 8.00 mmHg  LA Vol (A2C): 21.7 ml 10.32 ml/m RA Area: 19.70 cm  LA Vol (A4C): 54.4 ml 25.87 ml/m RA Volume: 53.20 ml 25.30 ml/m  LA Biplane Vol: 34.4 ml 16.36 ml/m  AORTIC VALVE  AV Area (Vmax): 0.77 cm  AV Area (Vmean): 0.74 cm  AV Area (VTI): 0.74 cm  AV Vmax: 415.00 cm/s  AV Vmean: 298.667 cm/s  AV VTI: 1.130 m  AV Peak Grad: 68.9 mmHg  AV Mean Grad: 45.0 mmHg  LVOT Vmax: 92.40 cm/s  LVOT Vmean: 63.800 cm/s  LVOT VTI: 0.240 m  LVOT/AV VTI ratio: 0.21  AORTA  Ao Root diam: 3.90 cm  Ao Asc diam: 4.40 cm  MITRAL VALVE TRICUSPID VALVE  MV Area (PHT): 2.32 cm TR Peak grad: 30.7 mmHg  MV PHT: 94.83 msec TR Vmax: 277.00 cm/s  MV Decel Time: 327 msec Estimated RAP: 8.00 mmHg  MV E velocity: 59.70 cm/s 103 cm/s RVSP: 38.7 mmHg  MV A velocity: 94.60 cm/s 70.3 cm/s  MV E/A ratio: 0.63 1.5 SHUNTS  Systemic  VTI: 0.24 m  Systemic Diam: 2.10 cm  [name] MD  Electronically signed by [name] MD  Signature Date/Time: 09/26/2019/10:08:05 AM    Physicians  Panel Physicians Referring Physician Case Authorizing Physician  Sherren Mocha, MD (Primary)       Procedures  LEFT HEART CATH AND CORONARY ANGIOGRAPHY     Conclusion  1. Moderate eccentric proxima/mid LAD stenosis  2. Severe stenosis of a moderate sized first diagonal branch, not suitable for PCI  3. Patent left main, LCx, and RCA with mild nonobstructive plaque  4. Severely calcified aortic valve leaflets by plain fluoroscopy with a 25 mmHg peak to peak gradient  Recommend: reviewed findings with [name]. Will arrange TAVR CT studies and refer to [name] who has been following the patient for his dilated ascending aorta. Multidisciplinary valve team review of case.            Indications  Severe aortic stenosis [I35.0 (ICD-10-CM)]     Procedural Details  Technical Details INDICATION: Severe bicuspid aortic stenosis. Pt with 43 mm ascending aorta and progressive, now severe bicuspid aortic stenosis, referred for diagnostic right and left heart  catheterization.  PROCEDURAL DETAILS: Right heart cath is attempted via right antecubital venous access. A wire would not pass beyond the mid upper arm. Venography is preformed but I was not able to access a central vein. The right wrist is prepped, draped, and anesthetized with 1% lidocaine. Using the modified Seldinger technique, a 5/6 French Slender sheath is introduced into the right radial artery. 3 mg of verapamil is administered through the sheath, weight-based unfractionated heparin was administered intravenously. Standard Judkins catheters are used for selective coronary angiography. LV pressure is recorded and an aortic valve pullback gradient is measured. The procedure is technically difficult because of subclavian tortuosity. For the RCA injections, a JR4 guide is used  with an 0.035 wire in the catheter to provide support. Catheter exchanges are performed over an exchange length guidewire. There are no immediate procedural complications. A TR band is used for radial hemostasis at the completion of the procedure. The patient was transferred to the post catheterization recovery area for further monitoring.    Estimated blood loss <50 mL.   During this procedure medications were administered to achieve and maintain moderate conscious sedation while the patient's heart rate, blood pressure, and oxygen saturation were continuously monitored and I was present face-to-face 100% of this time.     Medications  (Filter: Administrations occurring from 10/08/19 0817 to 10/08/19 0929)  Continuous medications are totaled by the amount administered until 10/08/19 0929.  Heparin (Porcine) in NaCl 1000-0.9 UT/500ML-% SOLN (mL)  Total volume: 1,000 mL  Date/Time   Rate/Dose/Volume Action  10/08/19 0836  500 mL Given  0836  500 mL Given  midazolam (VERSED) injection (mg)  Total dose: 3 mg  Date/Time   Rate/Dose/Volume Action  10/08/19 0845  2 mg Given  0900  1 mg Given  fentaNYL (SUBLIMAZE) injection (mcg)  Total dose: 50 mcg  Date/Time   Rate/Dose/Volume Action  10/08/19 0845  25 mcg Given  0900  25 mcg Given  lidocaine (PF) (XYLOCAINE) 1 % injection (mL)  Total volume: 4 mL  Date/Time   Rate/Dose/Volume Action  10/08/19 0848  2 mL Given  0859  2 mL Given  nitroGLYCERIN 1 mg/10 mL (100 mcg/mL) - IR/CATH LAB (mcg)  Total dose: 200 mcg  Date/Time   Rate/Dose/Volume Action  10/08/19 0857  200 mcg Given  Radial Cocktail/Verapamil only (mL)  Total volume: 10 mL  Date/Time   Rate/Dose/Volume Action  10/08/19 0900  10 mL Given  heparin injection (Units)  Total dose: 5,000 Units  Date/Time   Rate/Dose/Volume Action  10/08/19 0912  5,000 Units Given  iohexol (OMNIPAQUE) 350 MG/ML injection (mL)  Total volume: 60 mL  Date/Time   Rate/Dose/Volume Action   10/08/19 0923  60 mL Given     Sedation Time  Sedation Time Physician-1: 34 minutes 32 seconds        Contrast  Medication Name Total Dose  iohexol (OMNIPAQUE) 350 MG/ML injection 60 mL     Radiation/Fluoro  Fluoro time: 8.8 (min)  DAP: [id number] (mGycm2)  Cumulative Air Kerma: 517 (mGy)        Coronary Findings  Diagnostic  Dominance: Right  Left Main  The vessel exhibits minimal luminal irregularities.   Left Anterior Descending  Mid LAD lesion 60% stenosed  Mid LAD lesion is 60% stenosed. The lesion is eccentric.   First Diagonal Branch  1st Diag-1 lesion 75% stenosed  1st Diag-1 lesion is 75% stenosed.  1st Diag-2 lesion 75% stenosed  1st Diag-2 lesion is 75% stenosed.  Second Diagonal Branch  2nd Diag lesion 50% stenosed  2nd Diag lesion is 50% stenosed.   Left Circumflex  The vessel exhibits minimal luminal irregularities.   Right Coronary Artery  Prox RCA to Mid RCA lesion 35% stenosed  Prox RCA to Mid RCA lesion is 35% stenosed.  Intervention  No interventions have been documented.                            Coronary Diagrams  Diagnostic  Dominance: Right  &&&&&  Intervention       Implants  No implant documentation for this case.      Syngo Images Link to Procedure Log  Show images for CARDIAC CATHETERIZATION Procedure Log     Images on Long Term Storage   Show images for Priebe, Bryon A            Hemo Data    Most Recent Value  AO Systolic Pressure XX123456 mmHg  AO Diastolic Pressure 70 mmHg  AO Mean 88 mmHg  LV Systolic Pressure 0000000 mmHg  LV Diastolic Pressure 7 mmHg  LV EDP 15 mmHg  AOp Systolic Pressure XX123456 mmHg  AOp Diastolic Pressure 64 mmHg  AOp Mean Pressure 83 mmHg  LVp Systolic Pressure 0000000 mmHg  LVp Diastolic Pressure 2 mmHg  LVp EDP Pressure 29 mmHg     ADDENDUM REPORT: 11/03/2019 13:33  EXAM:  OVER-READ INTERPRETATION CT CHEST  The following report is an over-read performed by radiologist  Dr.  Samara Snide [facility] on 11/03/2019. This over-read  does not include interpretation of cardiac or coronary anatomy or  pathology. The CTA interpretation by the cardiologist is attached.  COMPARISON: 09/12/2018 chest CT angiogram.  FINDINGS:  Please see the separate concurrent chest CT angiogram report for  details.  IMPRESSION:  Please see the separate concurrent chest CT angiogram report for  details.  Electronically Signed  By: Ilona Sorrel M.D.  On: 11/03/2019 13:33   Addended by Sharyn Blitz, MD on 11/03/2019 1:35 PM  Study Result   CLINICAL DATA: Aortic Stenosis  EXAM:  Cardiac TAVR CT  MEDICATIONS:  None  TECHNIQUE:  The patient was scanned on a Siemens Force [id number] slice scanner. A 120  kV retrospective scan was triggered in the ascending thoracic aorta  at 140 HU's. Gantry rotation speed was 250 msecs and collimation was  .6 mm. No beta blockade or nitro were given. The 3D data set was  reconstructed in 5% intervals of the R-R cycle. Systolic and  diastolic phases were analyzed on a dedicated work station using  MPR, MIP and VRT modes. The patient received 80 cc of contrast.  The patient was scanned on a Siemens Force [id number] slice scanner. Gantry  rotation speed was 250 msecs. Collimation was .6 mm. A 100 kV  prospective scan was triggered in the ascending thoracic aorta at  140 HU's Full mA was used between 35% and 75% of the R-R interval.  Average HR during the scan was 47 bpm. The 3D data set was  interpreted on a dedicated work station using MPR, MIP and VRT  modes. A total of 80 cc of contrast was used.  FINDINGS:  Aortic Valve: Bicuspid and calcified with restricted motion. Appears  to be large right and left cusps with no raphe  Aorta: Mild to moderate fusiform dilatation normal arch vessels no  coarctation  Sino-tubular Junction: 34 mm  Ascending Thoracic Aorta: 43 mm  Aortic Arch: 36 mm  Descending Thoracic Aorta: 24 mm  Sinus of  Valsalva Measurements: Bicuspid valve with right cusp  including RCA ostia and left including LM ostia Long axis diameter  44 mm minor axis dimension 30 mm  Coronary Artery Height above Annulus:  Left Main: 16.7 mm above annulus  Right Coronary: 16.4 mm above annulus  Virtual Basal Annulus Measurements:  Maximum / Minimum Diameter: 26.6 mm x 23.5 mm  Perimeter: --  Area: 479 mm 2  Coronary Arteries: Sufficient height above annulus for deployment  Optimum Fluoroscopic Angle for Delivery: LAO 16 Cranial 3 degrees  IMPRESSION:  1. Bicuspid AV with annular area of 479 mm 2 suitable for a 26 mm  Sapien 3 valve  2. Fusiform dilatation of the ascending aortic root at 4.3 cm In  setting of bicuspid AV this will have to be taken into consideration  regarding choice of SAVR vs TAVR  3. Coronary arteries sufficient height above annulus for deployment  4. Optimum angiographic angle for deployment LAO 16 Cranial 3  degrees  [name]  Electronically Signed:  By: [name] M.D.  On: 11/03/2019 12:32   CLINICAL DATA: Severe symptomatic aortic stenosis. Pre-TAVR  evaluation.  EXAM:  CT ANGIOGRAPHY CHEST, ABDOMEN AND PELVIS  TECHNIQUE:  Multidetector CT imaging through the chest, abdomen and pelvis was  performed using the standard protocol during bolus administration of  intravenous contrast. Multiplanar reconstructed images and MIPs were  obtained and reviewed to evaluate the vascular anatomy.  CONTRAST: 125mL OMNIPAQUE IOHEXOL 350 MG/ML SOLN  COMPARISON: 09/12/2018 chest CT angiogram. 09/19/2018 CT  abdomen/pelvis.  FINDINGS:  CTA CHEST FINDINGS  Cardiovascular: Mild cardiomegaly. Diffuse thickening and coarse  calcification of the aortic valve. No significant pericardial  effusion/thickening. Atherosclerotic thoracic aorta with stable 4.5  cm ascending thoracic aortic aneurysm. Normal caliber pulmonary  arteries. No central pulmonary emboli.  Mediastinum/Nodes: No discrete  thyroid nodules. Unremarkable  esophagus. No pathologically enlarged axillary, mediastinal or hilar  lymph nodes.  Lungs/Pleura: No pneumothorax. No pleural effusion. No acute  consolidative airspace disease, lung masses or significant pulmonary  nodules.  Musculoskeletal: No aggressive appearing focal osseous lesions.  Moderate thoracic spondylosis.  CTA ABDOMEN AND PELVIS FINDINGS  Hepatobiliary: Normal liver with no liver mass. Normal gallbladder  with no radiopaque cholelithiasis. No biliary ductal dilatation.  Pancreas: Normal, with no mass or duct dilation.  Spleen: Normal size. No mass.  Adrenals/Urinary Tract: Normal adrenals. No hydronephrosis.  Scattered subcentimeter hypodense renal cortical lesions in the left  kidney are too small to characterize and are unchanged, requiring no  follow-up. No new contour deforming renal lesions. Normal bladder.  Stomach/Bowel: Normal non-distended stomach. Normal caliber small  bowel with no small bowel wall thickening. Normal appendix. Moderate  sigmoid diverticulosis, with no large bowel wall thickening or  significant pericolonic fat stranding.  Vascular/Lymphatic: Atherosclerotic nonaneurysmal abdominal aorta.  No pathologically enlarged lymph nodes in the abdomen or pelvis.  Reproductive: Mildly enlarged prostate.  Other: No pneumoperitoneum, ascites or focal fluid collection.  Musculoskeletal: No aggressive appearing focal osseous lesions.  Severe degenerative changes throughout the mid to lower lumbar  spine.  VASCULAR MEASUREMENTS PERTINENT TO TAVR:  AORTA:  Minimal Aortic Diameter-13.1 x 13.0 mm  Severity of Aortic Calcification-moderate  RIGHT PELVIS:  Right Common Iliac Artery -  Minimal Diameter-8.7 x 8.6 mm  Tortuosity-severe  Calcification-mild  Right External Iliac Artery -  Minimal Diameter-8.0 x 7.7 mm  Tortuosity-mild-to-moderate  Calcification-none  Right Common Femoral Artery -  Minimal Diameter-7.4 x 7.4 mm   Tortuosity-none  Calcification-none  LEFT PELVIS:  Left Common Iliac Artery -  Minimal Diameter-8.7 x 6.6 mm  Tortuosity-moderate  Calcification-moderate  Left External Iliac Artery -  Minimal Diameter-7.4 x 7.3 mm  Tortuosity-mild  Calcification-none  Left Common Femoral Artery -  Minimal Diameter-7.3 x 6.3 mm  Tortuosity-mild  Calcification-mild-to-moderate  Review of the MIP images confirms the above findings.  IMPRESSION:  1. Vascular findings and measurements pertinent to potential TAVR  procedure, as detailed.  2. Diffuse thickening and calcification of the aortic valve,  compatible with the reported history of severe symptomatic aortic  stenosis.  3. Mild cardiomegaly.  4. Stable 4.5 cm ascending thoracic aortic aneurysm. Ascending  thoracic aortic aneurysm. Recommend semi-annual imaging followup by  CTA or MRA and referral to cardiothoracic surgery if not already  obtained. This recommendation follows 2010  ACCF/AHA/AATS/ACR/ASA/SCA/SCAI/SIR/STS/SVM Guidelines for the  Diagnosis and Management of Patients With Thoracic Aortic Disease.  Circulation. 2010; [id number]. Aortic aneurysm NOS  (ICD10-I71.9).  5. Moderate sigmoid diverticulosis.  6. Mild prostatomegaly.  7. Aortic Atherosclerosis (ICD10-I70.0).  Electronically Signed  By: Ilona Sorrel M.D.  On: 11/03/2019 13:32    Impression:   This 69 year old gentleman has stage C, asymptomatic, severe bicuspid aortic valve stenosis with a 4.3 cm fusiform ascending aortic aneurysm. I have personally reviewed his 2D echocardiogram, cardiac catheterization, and CTA studies. His echocardiogram shows a calcified bicuspid aortic valve with an increase in the mean gradient from 31 mmHg in March to 43mmHg now. His left ventricular systolic function is preserved. Cardiac catheterization shows moderate disease in the mid LAD and a high-grade diagonal stenosis which is a relatively small vessel. There are no other significant  stenoses and I think this can be treated medically. I agree that aortic valve replacement is indicated in this patient with rapidly progressive aortic stenosis and a bicuspid aortic valve. He denies any symptoms but is not very active due to recovering from quadriceps surgery. I think his valve should be replaced before he develops left ventricular deterioration. He said that he is not interested in open surgical aortic valve replacement but would agree to transcatheter valve replacement if we thought that was an option. His gated cardiac CTA shows anatomy suitable for transcatheter aortic valve replacement using a Sapien 3 valve. His abdominal and pelvic CTA shows adequate pelvic vascular anatomy to allow transfemoral insertion.  The patient was counseled at length regarding treatment alternatives for management of severe symptomatic aortic stenosis. The risks and benefits of surgical intervention has been discussed in detail. Long-term prognosis with medical therapy was discussed. Alternative approaches such as conventional surgical aortic valve replacement, transcatheter aortic valve replacement, and palliative medical therapy were compared and contrasted at length. This discussion was placed in the context of the patient's own specific clinical presentation and past medical history. All of his questions have been addressed.  Following the decision to proceed with transcatheter aortic valve replacement, a discussion was held regarding what types of management strategies would be attempted intraoperatively in the event of life-threatening complications, including whether or not the patient would be considered a candidate for the use of cardiopulmonary bypass and/or conversion to open sternotomy for attempted surgical intervention. He is a low risk surgical patient and would be a candidate for emergent sternotomy to manage any intraoperative complications.  The patient has been advised of a variety of  complications that might develop including but not limited to risks of death, stroke, paravalvular  leak, aortic dissection or other major vascular complications, aortic annulus rupture, device embolization, cardiac rupture or perforation, mitral regurgitation, acute myocardial infarction, arrhythmia, heart block or bradycardia requiring permanent pacemaker placement, congestive heart failure, respiratory failure, renal failure, pneumonia, infection, other late complications related to structural valve deterioration or migration, or other complications that might ultimately cause a temporary or permanent loss of functional independence or other long term morbidity. The patient provides full informed consent for the procedure as described and all questions were answered.   Plan:   Transfemoral transcatheter aortic valve replacement.  Gaye Pollack, MD

## 2019-12-22 NOTE — Progress Notes (Addendum)
Anesthesia Chart Review:  Case: [id number] Date/Time: 12/23/19 0715   Procedure: TRANSCATHETER AORTIC VALVE REPLACEMENT, TRANSFEMORAL (N/A Chest)   Anesthesia type: General   Pre-op diagnosis: aortic stensis   Location: [facility] OR ROOM 16 / [place] OR   Surgeons: Sherren Mocha, MD    CT surgeon: Gilford Raid, MD  DISCUSSION: Patient is a 69 year old male scheduled for the above procedure.  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 04/13/15 TTE with Bubble study ([place]) showed PFO (not seen on 02/07/16 TTE). 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  Presurgical COVID-19 test is scheduled for 12/22/19.  Anesthesia team to evaluate on the day of surgery.   VS: BP (!) 135/105   Pulse 93   Temp 37 C   Resp 18   Ht 6' (1.829 m)   Wt 93.5 kg   SpO2 99%   BMI 27.95 kg/m  It does not appear that BP was rechecked at [place] visit, but previous DBP x2 < 100.  BP Readings from Last 3 Encounters:  12/18/19 (!) 135/105  12/16/19 122/78  11/20/19 128/89    PROVIDERS: [name], MD is PCP  [name], MD is cardiologist Lanelle Bal, MD is CT surgeon (following TAA). Last evaluation 11/20/19. Patient opted to proceed with TAVR rather than stardard AVR +/- replacement of ascending aorta.    LABS: Labs reviewed: Acceptable for  surgery. (all labs ordered are listed, but only abnormal results are displayed)  Labs Reviewed  SURGICAL PCR SCREEN - Abnormal; Notable for the following components:      Result Value   MRSA, PCR POSITIVE (*)    Staphylococcus aureus POSITIVE (*)    All other components within normal limits  COMPREHENSIVE METABOLIC PANEL - Abnormal; Notable for the following components:   Glucose, Bld 102 (*)    All other components within normal limits  APTT  BLOOD GAS, ARTERIAL  BRAIN NATRIURETIC PEPTIDE  CBC  HEMOGLOBIN A1C  PROTIME-INR  URINALYSIS, ROUTINE W REFLEX MICROSCOPIC  TYPE AND SCREEN    IMAGES: CTA chest/abd/pelvis 11/03/19: IMPRESSION: 1. Vascular findings and measurements pertinent to potential TAVR procedure, as detailed. 2. Diffuse thickening and calcification of the aortic valve, compatible with the reported history of severe symptomatic aortic stenosis. 3. Mild cardiomegaly. 4. Stable 4.5 cm ascending thoracic aortic aneurysm. Ascending thoracic aortic aneurysm. Recommend semi-annual imaging followup by CTA or MRA and referral to cardiothoracic surgery if not already obtained. This recommendation follows 2010 ACCF/AHA/AATS/ACR/ASA/SCA/SCAI/SIR/STS/SVM Guidelines for the Diagnosis and Management of Patients With Thoracic Aortic Disease. Circulation. 2010; [id number]. Aortic aneurysm NOS (ICD10-I71.9). 5. Moderate sigmoid diverticulosis. 6. Mild prostatomegaly. 7.  Aortic Atherosclerosis (ICD10-I70.0).  CXR 12/18/19: FINDINGS: The cardiac silhouette, mediastinal and hilar contours are within normal limits and stable. The lungs are clear. No pleural effusions. No pulmonary lesions. The bony thorax is intact. IMPRESSION: No acute cardiopulmonary findings.   EKG:12/18/19: Normal sinus rhythm Possible Anterior infarct ,  age undetermined Abnormal ECG Confirmed by Loralie Champagne [phone]) on 12/20/2019 10:30:59 PM   CV: CT Coronary 11/03/19: IMPRESSION: 1.  Bicuspid AV with annular area of 479 mm 2 suitable for a 26 mm Sapien 3 valve 2. Fusiform dilatation of the ascending aortic root at 4.3 cm In setting of bicuspid AV this will have to be taken into consideration regarding choice of SAVR vs TAVR 3.  Coronary arteries sufficient height above annulus for deployment 4. Optimum angiographic angle for deployment LAO 16 Cranial 3 degrees   Carotid US 12/18/19: Summary: Right Carotid: Velocities in the right ICA are consistent with a 1-39% stenosis. Left Carotid: Velocities in the left ICA are consistent with a 1-39% stenosis. Vertebrals: Bilateral vertebral arteries demonstrate antegrade flow.   Cardiac cath 10/08/19: 1. Moderate eccentric proxima/mid LAD stenosis (60%) 2. Severe stenosis of a moderate sized first diagonal branch, not suitable for PCI (75%). Second diagonal branch 50% stenosed. 3. Patent left main, LCx, and RCA with mild nonobstructive plaque. Proximal to mid RCA 35% stenosed. 4. Severely calcified aortic valve leaflets by plain fluoroscopy with a 25 mmHg peak to peak gradient Recommend: reviewed findings with [name]. Will arrange TAVR CT studies and refer to [name] who has been following the patient for his dilated ascending aorta. Multidisciplinary valve team review of case.    Echo 09/26/19: IMPRESSIONS  1. Left ventricular ejection fraction, by visual estimation, is 55 to 60%. The left ventricle has normal function. Normal left ventricular size. Left ventricular septal wall thickness was mildly increased. There is mildly increased left ventricular  hypertrophy.  2. Left ventricular diastolic Doppler parameters are consistent with impaired relaxation pattern of LV diastolic filling.  3. Global right ventricle has normal systolic function.The right ventricular size is normal. No increase in right ventricular wall thickness.  4. Left atrial size was mildly dilated.  5. Right atrial size was normal.  6. Mild mitral  annular calcification.  7. The mitral valve is normal in structure. Mild mitral valve regurgitation.  8. The tricuspid valve is normal in structure. Tricuspid valve regurgitation is mild.  9. The aortic valve is bicuspid Aortic valve regurgitation was not visualized by color flow Doppler. Severe aortic valve stenosis. 10. Bicuspid AV with calcification and restricted motion Sinnce last echo AS now severe. 11. The pulmonic valve was grossly normal. Pulmonic valve regurgitation is mild by color flow Doppler. 12. Aortic dilatation noted. 13. There is moderate dilatation of the ascending aorta measuring 44 mm. 14. Mildly elevated pulmonary artery systolic pressure.   On TTE with Bubble Study 04/13/15 ([place]) there was "An interatrial patent foramen ovale is visualized. The patent foramen ovale is demonstrated by agitated saline contrast." (No atrial septal defect or PFO was noted on 02/07/16 TTE.)    Past Medical History:  Diagnosis Date  . Arthritis    R shoulder, great toes, hands- has gout & has injections in knees with Dr. Estanislado Pandy   . Bicuspid aortic valve with ascending aorta 4.0 to 4.5 cm in diameter   . Cancer ([place])    skin ca-   . GERD (gastroesophageal reflux disease)   . Headache   . History of hiatal hernia   . Hypertension   . SDH (subdural hematoma) ([place])   . Severe aortic stenosis   . Vertebral artery aneurysm [facility])     Past Surgical History:  Procedure Laterality Date  . BRAIN SURGERY  2016   evacuation of SDH  . CARDIAC CATHETERIZATION    .  CARPAL TUNNEL RELEASE Bilateral   . GREEN LIGHT LASER TURP (TRANSURETHRAL RESECTION OF PROSTATE  04/27/2017  . KNEE ARTHROPLASTY    . LEFT HEART CATH AND CORONARY ANGIOGRAPHY N/A 10/08/2019   Procedure: LEFT HEART CATH AND CORONARY ANGIOGRAPHY;  Surgeon: Sherren Mocha, MD;  Location: [place] [facility];  Service: Cardiovascular;  Laterality: N/A;  . QUADRICEPS TENDON REPAIR Bilateral 06/15/2017   Procedure: REPAIR  QUADRICEP TENDON;  Surgeon: Meredith Pel, MD;  Location: [place];  Service: Orthopedics;  Laterality: Bilateral;  . SHOULDER SURGERY Right   . VASCULAR SURGERY      MEDICATIONS: . allopurinol (ZYLOPRIM) 300 MG tablet  . atorvastatin (LIPITOR) 20 MG tablet  . esomeprazole (NEXIUM) 20 MG capsule  . metoprolol succinate (TOPROL-XL) 25 MG 24 hr tablet  . Multiple Vitamin (MULTIVITAMIN WITH MINERALS) TABS tablet   . sodium chloride flush (NS) 0.9 % injection 3 mL   . [START ON 12/23/2019] cefUROXime (ZINACEF) 1.5 g in sodium chloride 0.9 % 100 mL IVPB  . [START ON 12/23/2019] dexmedetomidine (PRECEDEX) 400 MCG/100ML (4 mcg/mL) infusion  . [START ON 12/23/2019] heparin 30,000 units/NS 1000 mL solution for CELLSAVER  . [START ON 12/23/2019] magnesium sulfate (IV Push/IM) injection 40 mEq  . [START ON 12/23/2019] norepinephrine (LEVOPHED) 4mg  in 248mL premix infusion  . [START ON 12/23/2019] potassium chloride injection 80 mEq  . [START ON 12/23/2019] vancomycin (VANCOREADY) IVPB 1500 mg/300 mL    Myra Gianotti, PA-C Surgical Short Stay/Anesthesiology [facility] Phone [phone] [facility] Phone [phone] 12/22/2019 10:28 AM

## 2019-12-22 NOTE — Anesthesia Preprocedure Evaluation (Addendum)
Anesthesia Evaluation  Patient identified by MRN, date of birth, ID band Patient awake    Reviewed: Allergy & Precautions, NPO status , Patient's Chart, lab work & pertinent test results  History of Anesthesia Complications Negative for: history of anesthetic complications  Airway Mallampati: II  TM Distance: >3 FB Neck ROM: Full    Dental no notable dental hx. (+) Dental Advisory Given   Pulmonary neg pulmonary ROS,    Pulmonary exam normal        Cardiovascular hypertension, Pt. on home beta blockers + Valvular Problems/Murmurs AS  Rhythm:Regular Rate:Normal + Systolic murmurs  Cardiac Cath Left Main The vessel exhibits minimal luminal irregularities. Left Anterior Descending Mid LAD lesion 60% stenosed Mid LAD lesion is 60% stenosed. The lesion is eccentric. First Diagonal Branch 1st Diag-1 lesion 75% stenosed 1st Diag-1 lesion is 75% stenosed. 1st Diag-2 lesion 75% stenosed 1st Diag-2 lesion is 75% stenosed. Second Diagonal Branch 2nd Diag lesion 50% stenosed 2nd Diag lesion is 50% stenosed. Left Circumflex The vessel exhibits minimal luminal irregularities. Right Coronary Artery Prox RCA to Mid RCA lesion 35% stenosed Prox RCA to Mid RCA lesion is 35% stenosed. Intervention  No interventions have been documented.    Neuro/Psych  Headaches, vertebral artery aneurysm    GI/Hepatic Neg liver ROS, hiatal hernia, GERD  ,  Endo/Other  negative endocrine ROS  Renal/GU negative Renal ROS     Musculoskeletal negative musculoskeletal ROS (+)   Abdominal   Peds  Hematology negative hematology ROS (+)   Anesthesia Other Findings Day of surgery medications reviewed with the patient.  Reproductive/Obstetrics                          Anesthesia Physical Anesthesia Plan  ASA: III  Anesthesia Plan: MAC   Post-op Pain Management:    Induction: Intravenous  PONV Risk Score and  Plan: Ondansetron  Airway Management Planned: Natural Airway and Simple Face Mask  Additional Equipment: Arterial line  Intra-op Plan:   Post-operative Plan:   Informed Consent: I have reviewed the patients History and Physical, chart, labs and discussed the procedure including the risks, benefits and alternatives for the proposed anesthesia with the patient or authorized representative who has indicated his/her understanding and acceptance.     Dental advisory given  Plan Discussed with: CRNA and Anesthesiologist  Anesthesia Plan Comments: (PAT note written 12/22/2019 by Myra Gianotti, PA-C. Had PFO on 04/13/15 TTE with Bubble study.)     Anesthesia Quick Evaluation

## 2019-12-23 ENCOUNTER — Other Ambulatory Visit: Payer: Self-pay

## 2019-12-23 ENCOUNTER — Inpatient Hospital Stay (HOSPITAL_COMMUNITY)
Admission: RE | Admit: 2019-12-23 | Discharge: 2019-12-23 | Disposition: A | Discharge status: Home / Self Care | Payer: Medicare Other | Source: Home / Self Care | Attending: Physician Assistant | Admitting: Physician Assistant

## 2019-12-23 ENCOUNTER — Inpatient Hospital Stay (HOSPITAL_COMMUNITY)
Admission: RE | Admit: 2019-12-23 | Discharge: 2019-12-24 | DRG: 267 | Disposition: A | Discharge status: Home / Self Care | Payer: Medicare Other | Attending: Cardiovascular Disease | Admitting: Cardiovascular Disease

## 2019-12-23 ENCOUNTER — Encounter (HOSPITAL_COMMUNITY): Payer: Self-pay | Admitting: Cardiovascular Disease

## 2019-12-23 ENCOUNTER — Inpatient Hospital Stay (HOSPITAL_COMMUNITY): Payer: Medicare Other | Admitting: Vascular Surgery

## 2019-12-23 ENCOUNTER — Encounter (HOSPITAL_COMMUNITY)
Admission: RE | Disposition: A | Discharge status: Home / Self Care | Payer: Self-pay | Source: Home / Self Care | Attending: Cardiovascular Disease

## 2019-12-23 ENCOUNTER — Inpatient Hospital Stay (HOSPITAL_COMMUNITY): Payer: Medicare Other | Admitting: Certified Registered"

## 2019-12-23 ENCOUNTER — Inpatient Hospital Stay (HOSPITAL_COMMUNITY): Payer: Medicare Other

## 2019-12-23 DIAGNOSIS — Z96659 Presence of unspecified artificial knee joint: Secondary | ICD-10-CM | POA: Diagnosis present

## 2019-12-23 DIAGNOSIS — I350 Nonrheumatic aortic (valve) stenosis: Secondary | ICD-10-CM

## 2019-12-23 DIAGNOSIS — Z952 Presence of prosthetic heart valve: Secondary | ICD-10-CM | POA: Diagnosis not present

## 2019-12-23 DIAGNOSIS — Z79899 Other long term (current) drug therapy: Secondary | ICD-10-CM | POA: Diagnosis not present

## 2019-12-23 DIAGNOSIS — Z8249 Family history of ischemic heart disease and other diseases of the circulatory system: Secondary | ICD-10-CM | POA: Diagnosis not present

## 2019-12-23 DIAGNOSIS — Z006 Encounter for examination for normal comparison and control in clinical research program: Secondary | ICD-10-CM

## 2019-12-23 DIAGNOSIS — Q2543 Congenital aneurysm of aorta: Secondary | ICD-10-CM

## 2019-12-23 DIAGNOSIS — M19042 Primary osteoarthritis, left hand: Secondary | ICD-10-CM | POA: Diagnosis present

## 2019-12-23 DIAGNOSIS — I2510 Atherosclerotic heart disease of native coronary artery without angina pectoris: Secondary | ICD-10-CM | POA: Diagnosis present

## 2019-12-23 DIAGNOSIS — Z9079 Acquired absence of other genital organ(s): Secondary | ICD-10-CM

## 2019-12-23 DIAGNOSIS — I726 Aneurysm of vertebral artery: Secondary | ICD-10-CM | POA: Diagnosis present

## 2019-12-23 DIAGNOSIS — M19041 Primary osteoarthritis, right hand: Secondary | ICD-10-CM | POA: Diagnosis present

## 2019-12-23 DIAGNOSIS — Z881 Allergy status to other antibiotic agents status: Secondary | ICD-10-CM | POA: Diagnosis not present

## 2019-12-23 DIAGNOSIS — I712 Thoracic aortic aneurysm, without rupture: Secondary | ICD-10-CM | POA: Diagnosis present

## 2019-12-23 DIAGNOSIS — K219 Gastro-esophageal reflux disease without esophagitis: Secondary | ICD-10-CM | POA: Diagnosis present

## 2019-12-23 DIAGNOSIS — M19071 Primary osteoarthritis, right ankle and foot: Secondary | ICD-10-CM | POA: Diagnosis present

## 2019-12-23 DIAGNOSIS — M19072 Primary osteoarthritis, left ankle and foot: Secondary | ICD-10-CM | POA: Diagnosis present

## 2019-12-23 DIAGNOSIS — M19011 Primary osteoarthritis, right shoulder: Secondary | ICD-10-CM | POA: Diagnosis present

## 2019-12-23 DIAGNOSIS — Z20822 Contact with and (suspected) exposure to covid-19: Secondary | ICD-10-CM | POA: Diagnosis present

## 2019-12-23 HISTORY — PX: TRANSCATHETER AORTIC VALVE REPLACEMENT, TRANSFEMORAL: SHX6400

## 2019-12-23 LAB — POCT I-STAT, CHEM 8
BUN: 17 mg/dL (ref 8–23)
Calcium, Ion: 1.24 mmol/L (ref 1.15–1.40)
Chloride: 105 mmol/L (ref 98–111)
Creatinine, Ser: 1 mg/dL (ref 0.61–1.24)
Glucose, Bld: 114 mg/dL — ABNORMAL HIGH (ref 70–99)
HCT: 37 % — ABNORMAL LOW (ref 39.0–52.0)
Hemoglobin: 12.6 g/dL — ABNORMAL LOW (ref 13.0–17.0)
Potassium: 4.1 mmol/L (ref 3.5–5.1)
Sodium: 140 mmol/L (ref 135–145)
TCO2: 25 mmol/L (ref 22–32)

## 2019-12-23 SURGERY — IMPLANTATION, AORTIC VALVE, TRANSCATHETER, FEMORAL APPROACH
Anesthesia: Monitor Anesthesia Care | Site: Chest

## 2019-12-23 MED ORDER — SODIUM CHLORIDE 0.9 % IV SOLN
INTRAVENOUS | Status: AC
  Administered 2019-12-23: 11:00:00 75 mL/h via INTRAVENOUS

## 2019-12-23 MED ORDER — CHLORHEXIDINE GLUCONATE 4 % EX LIQD: 60.0000 mL | Freq: Once | CUTANEOUS | Status: DC

## 2019-12-23 MED ORDER — PROTAMINE SULFATE 10 MG/ML IV SOLN
INTRAVENOUS | Status: DC | PRN
  Administered 2019-12-23: 140 mg via INTRAVENOUS

## 2019-12-23 MED ORDER — SODIUM CHLORIDE 0.9 % IV SOLN
INTRAVENOUS | Status: DC | PRN
  Administered 2019-12-23: 500 mL

## 2019-12-23 MED ORDER — ATORVASTATIN CALCIUM 10 MG PO TABS: 20.0000 mg | ORAL_TABLET | Freq: Every day | ORAL | Status: DC

## 2019-12-23 MED ORDER — CLOPIDOGREL BISULFATE 75 MG PO TABS
75.0000 mg | ORAL_TABLET | Freq: Every day | ORAL | Status: DC
  Administered 2019-12-24: 75 mg via ORAL
  Filled 2019-12-23: qty 1

## 2019-12-23 MED ORDER — ACETAMINOPHEN 650 MG RE SUPP: 650.0000 mg | Freq: Four times a day (QID) | RECTAL | Status: DC | PRN

## 2019-12-23 MED ORDER — LACTATED RINGERS IV SOLN: INTRAVENOUS | Status: DC | PRN

## 2019-12-23 MED ORDER — ADULT MULTIVITAMIN W/MINERALS CH
1.0000 | ORAL_TABLET | Freq: Every day | ORAL | Status: DC
  Administered 2019-12-24: 1 via ORAL
  Filled 2019-12-23: qty 1

## 2019-12-23 MED ORDER — MIDAZOLAM HCL 5 MG/5ML IJ SOLN
INTRAMUSCULAR | Status: DC | PRN
  Administered 2019-12-23: 2 mg via INTRAVENOUS

## 2019-12-23 MED ORDER — HEPARIN SODIUM (PORCINE) 1000 UNIT/ML IJ SOLN
INTRAMUSCULAR | Status: DC | PRN
  Administered 2019-12-23: 14000 [IU] via INTRAVENOUS

## 2019-12-23 MED ORDER — PROPOFOL 500 MG/50ML IV EMUL
INTRAVENOUS | Status: DC | PRN
  Administered 2019-12-23: 25 ug/kg/min via INTRAVENOUS

## 2019-12-23 MED ORDER — MORPHINE SULFATE (PF) 2 MG/ML IV SOLN: 1.0000 mg | INTRAVENOUS | Status: DC | PRN

## 2019-12-23 MED ORDER — ONDANSETRON HCL 4 MG/2ML IJ SOLN: 4.0000 mg | Freq: Four times a day (QID) | INTRAMUSCULAR | Status: DC | PRN

## 2019-12-23 MED ORDER — METOPROLOL TARTRATE 5 MG/5ML IV SOLN: 2.5000 mg | INTRAVENOUS | Status: DC | PRN

## 2019-12-23 MED ORDER — SODIUM CHLORIDE 0.9 % IV SOLN
1.5000 g | Freq: Two times a day (BID) | INTRAVENOUS | Status: DC
  Administered 2019-12-23 – 2019-12-24 (×2): 1.5 g via INTRAVENOUS
  Filled 2019-12-23 (×3): qty 1.5

## 2019-12-23 MED ORDER — TRAMADOL HCL 50 MG PO TABS: 50.0000 mg | ORAL_TABLET | ORAL | Status: DC | PRN

## 2019-12-23 MED ORDER — OXYCODONE HCL 5 MG PO TABS
5.0000 mg | ORAL_TABLET | ORAL | Status: DC | PRN
  Administered 2019-12-23 – 2019-12-24 (×2): 10 mg via ORAL
  Filled 2019-12-23 (×2): qty 2

## 2019-12-23 MED ORDER — IODIXANOL 320 MG/ML IV SOLN
INTRAVENOUS | Status: DC | PRN
  Administered 2019-12-23: 40.1 mL via INTRA_ARTERIAL

## 2019-12-23 MED ORDER — LIDOCAINE HCL 1 % IJ SOLN
INTRAMUSCULAR | Status: AC
  Filled 2019-12-23: qty 20

## 2019-12-23 MED ORDER — PROPOFOL 10 MG/ML IV BOLUS
INTRAVENOUS | Status: AC
  Filled 2019-12-23: qty 20

## 2019-12-23 MED ORDER — SODIUM CHLORIDE 0.9% FLUSH
3.0000 mL | Freq: Two times a day (BID) | INTRAVENOUS | Status: DC
  Administered 2019-12-23 – 2019-12-24 (×2): 3 mL via INTRAVENOUS

## 2019-12-23 MED ORDER — ASPIRIN 81 MG PO CHEW
81.0000 mg | CHEWABLE_TABLET | Freq: Every day | ORAL | Status: DC
  Administered 2019-12-24: 08:00:00 81 mg via ORAL
  Filled 2019-12-23: qty 1

## 2019-12-23 MED ORDER — NITROGLYCERIN IN D5W 200-5 MCG/ML-% IV SOLN: 0.0000 ug/min | INTRAVENOUS | Status: DC

## 2019-12-23 MED ORDER — METOPROLOL SUCCINATE ER 25 MG PO TB24
25.0000 mg | ORAL_TABLET | Freq: Every day | ORAL | Status: DC
  Administered 2019-12-24: 08:00:00 25 mg via ORAL
  Filled 2019-12-23: qty 1

## 2019-12-23 MED ORDER — PHENYLEPHRINE HCL-NACL 20-0.9 MG/250ML-% IV SOLN
0.0000 ug/min | INTRAVENOUS | Status: DC
  Filled 2019-12-23: qty 250

## 2019-12-23 MED ORDER — SODIUM CHLORIDE 0.9 % IV SOLN: 250.0000 mL | INTRAVENOUS | Status: DC | PRN

## 2019-12-23 MED ORDER — SODIUM CHLORIDE 0.9 % IV SOLN: INTRAVENOUS | Status: DC

## 2019-12-23 MED ORDER — ACETAMINOPHEN 500 MG PO TABS
1000.0000 mg | ORAL_TABLET | Freq: Once | ORAL | Status: AC
  Administered 2019-12-23: 06:00:00 1000 mg via ORAL
  Filled 2019-12-23: qty 2

## 2019-12-23 MED ORDER — ALLOPURINOL 300 MG PO TABS
300.0000 mg | ORAL_TABLET | Freq: Every day | ORAL | Status: DC
  Administered 2019-12-24: 08:00:00 300 mg via ORAL
  Filled 2019-12-23: qty 1

## 2019-12-23 MED ORDER — PROTAMINE SULFATE 10 MG/ML IV SOLN
INTRAVENOUS | Status: AC
  Filled 2019-12-23: qty 25

## 2019-12-23 MED ORDER — DEXMEDETOMIDINE HCL IN NACL 400 MCG/100ML IV SOLN
INTRAVENOUS | Status: DC | PRN
  Administered 2019-12-23: 89.8 ug via INTRAVENOUS
  Administered 2019-12-23: 1 ug/kg/h via INTRAVENOUS

## 2019-12-23 MED ORDER — SODIUM CHLORIDE 0.9 % IV SOLN
INTRAVENOUS | Status: AC
  Filled 2019-12-23 (×3): qty 1.2

## 2019-12-23 MED ORDER — VANCOMYCIN HCL IN DEXTROSE 1-5 GM/200ML-% IV SOLN
1000.0000 mg | Freq: Once | INTRAVENOUS | Status: AC
  Administered 2019-12-23: 19:00:00 1000 mg via INTRAVENOUS
  Filled 2019-12-23: qty 200

## 2019-12-23 MED ORDER — ACETAMINOPHEN 325 MG PO TABS: 650.0000 mg | ORAL_TABLET | Freq: Four times a day (QID) | ORAL | Status: DC | PRN

## 2019-12-23 MED ORDER — CHLORHEXIDINE GLUCONATE 0.12 % MT SOLN
15.0000 mL | Freq: Once | OROMUCOSAL | Status: AC
  Administered 2019-12-23: 06:00:00 15 mL via OROMUCOSAL
  Filled 2019-12-23: qty 15

## 2019-12-23 MED ORDER — EPHEDRINE SULFATE-NACL 50-0.9 MG/10ML-% IV SOSY
PREFILLED_SYRINGE | INTRAVENOUS | Status: DC | PRN
  Administered 2019-12-23: 7.5 mg via INTRAVENOUS

## 2019-12-23 MED ORDER — SODIUM CHLORIDE 0.9% FLUSH: 3.0000 mL | INTRAVENOUS | Status: DC | PRN

## 2019-12-23 MED ORDER — CHLORHEXIDINE GLUCONATE 4 % EX LIQD: 30.0000 mL | CUTANEOUS | Status: DC

## 2019-12-23 MED ORDER — FENTANYL CITRATE (PF) 100 MCG/2ML IJ SOLN
INTRAMUSCULAR | Status: DC | PRN
  Administered 2019-12-23 (×2): 50 ug via INTRAVENOUS

## 2019-12-23 MED ORDER — LIDOCAINE HCL 1 % IJ SOLN
INTRAMUSCULAR | Status: DC | PRN
  Administered 2019-12-23: 18 mL

## 2019-12-23 MED ORDER — PANTOPRAZOLE SODIUM 40 MG PO TBEC
40.0000 mg | DELAYED_RELEASE_TABLET | Freq: Every day | ORAL | Status: DC
  Administered 2019-12-24: 08:00:00 40 mg via ORAL
  Filled 2019-12-23: qty 1

## 2019-12-23 MED ORDER — MIDAZOLAM HCL 2 MG/2ML IJ SOLN
INTRAMUSCULAR | Status: AC
  Filled 2019-12-23: qty 2

## 2019-12-23 MED ORDER — FENTANYL CITRATE (PF) 250 MCG/5ML IJ SOLN
INTRAMUSCULAR | Status: AC
  Filled 2019-12-23: qty 5

## 2019-12-23 MED ORDER — HEPARIN SODIUM (PORCINE) 1000 UNIT/ML IJ SOLN
INTRAMUSCULAR | Status: AC
  Filled 2019-12-23: qty 2

## 2019-12-23 SURGICAL SUPPLY — 88 items
ADH SKN CLS APL DERMABOND .7 (GAUZE/BANDAGES/DRESSINGS) ×1
APL PRP STRL LF DISP 70% ISPRP (MISCELLANEOUS) ×1
BAG DECANTER FOR FLEXI CONT (MISCELLANEOUS) IMPLANT
BAG SNAP BAND KOVER 36X36 (MISCELLANEOUS) ×2 IMPLANT
BLADE CLIPPER SURG (BLADE) IMPLANT
BLADE STERNUM SYSTEM 6 (BLADE) IMPLANT
BLADE SURG 10 STRL SS (BLADE) IMPLANT
CABLE ADAPT CONN TEMP 6FT (ADAPTER) ×2 IMPLANT
CANISTER SUCT 3000ML PPV (MISCELLANEOUS) IMPLANT
CATH DIAG EXPO 6F AL1 (CATHETERS) IMPLANT
CATH DIAG EXPO 6F VENT PIG 145 (CATHETERS) ×4 IMPLANT
CATH EXTERNAL FEMALE PUREWICK (CATHETERS) IMPLANT
CATH INFINITI 6F AL2 (CATHETERS) IMPLANT
CATH S G BIP PACING (CATHETERS) ×2 IMPLANT
CHLORAPREP W/TINT 26 (MISCELLANEOUS) ×2 IMPLANT
CLIP VESOCCLUDE MED 24/CT (CLIP) IMPLANT
CLIP VESOCCLUDE SM WIDE 24/CT (CLIP) IMPLANT
CONT SPEC 4OZ CLIKSEAL STRL BL (MISCELLANEOUS) ×4 IMPLANT
COVER BACK TABLE 80X110 HD (DRAPES) IMPLANT
COVER WAND RF STERILE (DRAPES) ×2 IMPLANT
DECANTER SPIKE VIAL GLASS SM (MISCELLANEOUS) ×2 IMPLANT
DERMABOND ADVANCED (GAUZE/BANDAGES/DRESSINGS) ×1
DERMABOND ADVANCED .7 DNX12 (GAUZE/BANDAGES/DRESSINGS) ×1 IMPLANT
DEVICE CLOSURE PERCLS PRGLD 6F (VASCULAR PRODUCTS) ×2 IMPLANT
DRAPE INCISE IOBAN 66X45 STRL (DRAPES) IMPLANT
DRSG TEGADERM 4X4.75 (GAUZE/BANDAGES/DRESSINGS) ×4 IMPLANT
ELECT CAUTERY BLADE 6.4 (BLADE) IMPLANT
ELECT REM PT RETURN 9FT ADLT (ELECTROSURGICAL) ×4
ELECTRODE REM PT RTRN 9FT ADLT (ELECTROSURGICAL) ×2 IMPLANT
FELT TEFLON 6X6 (MISCELLANEOUS) ×2 IMPLANT
GAUZE 4X4 16PLY RFD (DISPOSABLE) ×1 IMPLANT
GAUZE SPONGE 4X4 12PLY STRL (GAUZE/BANDAGES/DRESSINGS) ×2 IMPLANT
GAUZE SPONGE 4X4 12PLY STRL LF (GAUZE/BANDAGES/DRESSINGS) ×1 IMPLANT
GLOVE BIO SURGEON STRL SZ7.5 (GLOVE) IMPLANT
GLOVE BIO SURGEON STRL SZ8 (GLOVE) IMPLANT
GLOVE EUDERMIC 7 POWDERFREE (GLOVE) IMPLANT
GLOVE ORTHO TXT STRL SZ7.5 (GLOVE) IMPLANT
GOWN STRL REUS W/ TWL LRG LVL3 (GOWN DISPOSABLE) IMPLANT
GOWN STRL REUS W/ TWL XL LVL3 (GOWN DISPOSABLE) ×1 IMPLANT
GOWN STRL REUS W/TWL LRG LVL3 (GOWN DISPOSABLE)
GOWN STRL REUS W/TWL XL LVL3 (GOWN DISPOSABLE) ×2
GUIDEWIRE SAF TJ AMPL .035X180 (WIRE) ×2 IMPLANT
GUIDEWIRE SAFE TJ AMPLATZ EXST (WIRE) ×2 IMPLANT
INSERT FOGARTY SM (MISCELLANEOUS) IMPLANT
KIT BASIN OR (CUSTOM PROCEDURE TRAY) ×2 IMPLANT
KIT HEART LEFT (KITS) ×2 IMPLANT
KIT SUCTION CATH 14FR (SUCTIONS) IMPLANT
KIT TURNOVER KIT B (KITS) ×2 IMPLANT
LOOP VESSEL MAXI BLUE (MISCELLANEOUS) IMPLANT
LOOP VESSEL MINI RED (MISCELLANEOUS) IMPLANT
NS IRRIG 1000ML POUR BTL (IV SOLUTION) ×2 IMPLANT
PACK ENDO MINOR (CUSTOM PROCEDURE TRAY) ×2 IMPLANT
PAD ARMBOARD 7.5X6 YLW CONV (MISCELLANEOUS) ×4 IMPLANT
PAD ELECT DEFIB RADIOL ZOLL (MISCELLANEOUS) ×2 IMPLANT
PENCIL BUTTON HOLSTER BLD 10FT (ELECTRODE) IMPLANT
PERCLOSE PROGLIDE 6F (VASCULAR PRODUCTS) ×4
POSITIONER HEAD DONUT 9IN (MISCELLANEOUS) ×2 IMPLANT
SET MICROPUNCTURE 5F STIFF (MISCELLANEOUS) ×2 IMPLANT
SHEATH BRITE TIP 7FR 35CM (SHEATH) ×2 IMPLANT
SHEATH PINNACLE 6F 10CM (SHEATH) ×2 IMPLANT
SHEATH PINNACLE 8F 10CM (SHEATH) ×2 IMPLANT
SLEEVE REPOSITIONING LENGTH 30 (MISCELLANEOUS) ×2 IMPLANT
SPONGE LAP 18X18 X RAY DECT (DISPOSABLE) ×1 IMPLANT
STOPCOCK MORSE 400PSI 3WAY (MISCELLANEOUS) ×4 IMPLANT
SUT ETHIBOND X763 2 0 SH 1 (SUTURE) IMPLANT
SUT GORETEX CV 4 TH 22 36 (SUTURE) IMPLANT
SUT GORETEX CV4 TH-18 (SUTURE) IMPLANT
SUT MNCRL AB 3-0 PS2 18 (SUTURE) IMPLANT
SUT PROLENE 5 0 C 1 36 (SUTURE) IMPLANT
SUT PROLENE 6 0 C 1 30 (SUTURE) IMPLANT
SUT SILK  1 MH (SUTURE) ×1
SUT SILK 1 MH (SUTURE) ×1 IMPLANT
SUT VIC AB 2-0 CT1 27 (SUTURE)
SUT VIC AB 2-0 CT1 TAPERPNT 27 (SUTURE) IMPLANT
SUT VIC AB 2-0 CTX 36 (SUTURE) IMPLANT
SUT VIC AB 3-0 SH 8-18 (SUTURE) IMPLANT
SYR 50ML LL SCALE MARK (SYRINGE) ×2 IMPLANT
SYR BULB IRRIGATION 50ML (SYRINGE) IMPLANT
SYR CONTROL 10ML LL (SYRINGE) ×1 IMPLANT
SYR MEDRAD MARK V 150ML (SYRINGE) ×2 IMPLANT
TAPE CLOTH SURG 4X10 WHT LF (GAUZE/BANDAGES/DRESSINGS) ×1 IMPLANT
TOWEL GREEN STERILE (TOWEL DISPOSABLE) ×4 IMPLANT
TRANSDUCER W/STOPCOCK (MISCELLANEOUS) ×4 IMPLANT
TRAY FOLEY SLVR 14FR TEMP STAT (SET/KITS/TRAYS/PACK) IMPLANT
TUBE SUCT INTRACARD DLP 20F (MISCELLANEOUS) IMPLANT
VALVE 26 ULTRA SAPIEN KIT (Valve) ×1 IMPLANT
WIRE EMERALD 3MM-J .035X150CM (WIRE) ×2 IMPLANT
WIRE EMERALD 3MM-J .035X260CM (WIRE) ×2 IMPLANT

## 2019-12-23 NOTE — Op Note (Signed)
[facility]   MULTIDISCIPLINARY HEART VALVE TEAM   TAVR OPERATIVE NOTE   Date of Procedure:  12/23/2019  Preoperative Diagnosis: Severe Aortic Stenosis   Postoperative Diagnosis: Same   Procedure:    Transcatheter Aortic Valve Replacement - Percutaneous Right Transfemoral Approach  Edwards Sapien 3 Ultra THV (size 26 mm, model # 9750TFX, serial # [id number])   Co-Surgeons:  Gaye Pollack, MD and Sherren Mocha, MD   Anesthesiologist:  Nelda Severe. Tobias Alexander, MD  Echocardiographer:  [name], MD  Pre-operative Echo Findings:  Severe bicuspid aortic stenosis  Normal left ventricular systolic function  Post-operative Echo Findings:  No paravalvular leak  Normal left ventricular systolic function   BRIEF CLINICAL NOTE AND INDICATIONS FOR SURGERY  This 69 year old gentleman has stage C, asymptomatic, severe bicuspid aortic valve stenosis with a 4.3 cm fusiform ascending aortic aneurysm. I have personally reviewed his 2D echocardiogram, cardiac catheterization, and CTA studies. His echocardiogram shows a calcified bicuspid aortic valve with an increase in the mean gradient from 31 mmHg in March to 39mmHg now. His left ventricular systolic function is preserved. Cardiac catheterization shows moderate disease in the mid LAD and a high-grade diagonal stenosis which is a relatively small vessel. There are no other significant stenoses and I think this can be treated medically. I agree that aortic valve replacement is indicated in this patient with rapidly progressive aortic stenosis and a bicuspid aortic valve. He denies any symptoms but is not very active due to recovering from quadriceps surgery. I think his valve should be replaced before he develops left ventricular deterioration. He said that he is not interested in open surgical aortic valve replacement but would agree to transcatheter valve replacement if we thought that was an option. His gated cardiac CTA shows  anatomy suitable for transcatheter aortic valve replacement using a Sapien 3 valve. His abdominal and pelvic CTA shows adequate pelvic vascular anatomy to allow transfemoral insertion.  The patient was counseled at length regarding treatment alternatives for management of severe symptomatic aortic stenosis. The risks and benefits of surgical intervention has been discussed in detail. Long-term prognosis with medical therapy was discussed. Alternative approaches such as conventional surgical aortic valve replacement, transcatheter aortic valve replacement, and palliative medical therapy were compared and contrasted at length. This discussion was placed in the context of the patient's own specific clinical presentation and past medical history. All of his questions have been addressed.  Following the decision to proceed with transcatheter aortic valve replacement, a discussion was held regarding what types of management strategies would be attempted intraoperatively in the event of life-threatening complications, including whether or not the patient would be considered a candidate for the use of cardiopulmonary bypass and/or conversion to open sternotomy for attempted surgical intervention. He is a low risk surgical patient and would be a candidate for emergent sternotomy to manage any intraoperative complications.  The patient has been advised of a variety of complications that might develop including but not limited to risks of death, stroke, paravalvular leak, aortic dissection or other major vascular complications, aortic annulus rupture, device embolization, cardiac rupture or perforation, mitral regurgitation, acute myocardial infarction, arrhythmia, heart block or bradycardia requiring permanent pacemaker placement, congestive heart failure, respiratory failure, renal failure, pneumonia, infection, other late complications related to structural valve deterioration or migration, or other complications that  might ultimately cause a temporary or permanent loss of functional independence or other long term morbidity. The patient provides full informed consent for the procedure as  described and all questions were answered.     DETAILS OF THE OPERATIVE PROCEDURE  PREPARATION:    The patient is brought to the operating room on the above mentioned date and appropriate monitoring was established by the anesthesia team. The patient is placed in the supine position on the operating table.  Intravenous antibiotics are administered. The patient is monitored closely throughout the procedure under conscious sedation.  Baseline transthoracic echocardiogram was performed. The patient's abdomen and both groins are prepared and draped in a sterile manner. A time out procedure is performed.   PERIPHERAL ACCESS:    Using the modified Seldinger technique, femoral arterial and venous access was obtained with placement of 6 Fr sheaths on the left side.  A pigtail diagnostic catheter was passed through the left arterial sheath under fluoroscopic guidance into the aortic root.  A temporary transvenous pacemaker catheter was passed through the left femoral venous sheath under fluoroscopic guidance into the right ventricle.  The pacemaker was tested to ensure stable lead placement and pacemaker capture. Aortic root angiography was performed in order to determine the optimal angiographic angle for valve deployment.   TRANSFEMORAL ACCESS:   Percutaneous transfemoral access and sheath placement was performed using ultrasound guidance.  The right common femoral artery was cannulated using a micropuncture needle and appropriate location was verified using hand injection angiogram.  A pair of Abbott Perclose percutaneous closure devices were placed and a 6 French sheath replaced into the femoral artery.  The patient was heparinized systemically and ACT verified > 250 seconds.    A 14 Fr transfemoral E-sheath was introduced into  the right common femoral artery after progressively dilating over an Amplatz superstiff wire. An AL-2 catheter was used to direct a straight-tip exchange length wire across the native aortic valve into the left ventricle. This was exchanged out for a pigtail catheter and position was confirmed in the LV apex. Simultaneous LV and Ao pressures were recorded.  The pigtail catheter was exchanged for an Amplatz Extra-stiff wire in the LV apex.     BALLOON AORTIC VALVULOPLASTY:   Not performed   TRANSCATHETER HEART VALVE DEPLOYMENT:   An Edwards Sapien 3 Ultra transcatheter heart valve (size 26 mm, model #9750TFX, serial [id number]) was prepared and crimped per manufacturer's guidelines, and the proper orientation of the valve is confirmed on the [employer] delivery system. The valve was advanced through the introducer sheath using normal technique until in an appropriate position in the abdominal aorta beyond the sheath tip. The balloon was then retracted and using the fine-tuning wheel was centered on the valve. The valve was then advanced across the aortic arch using appropriate flexion of the catheter. The valve was carefully positioned across the aortic valve annulus. The Commander catheter was retracted using normal technique. Once final position of the valve has been confirmed by angiographic assessment, the valve is deployed while temporarily holding ventilation and during rapid ventricular pacing to maintain systolic blood pressure < 50 mmHg and pulse pressure < 10 mmHg. The balloon inflation is held for >3 seconds after reaching full deployment volume. Once the balloon has fully deflated the balloon is retracted into the ascending aorta and valve function is assessed using echocardiography. There is felt to be no paravalvular leak and no central aortic insufficiency.  Mean transvalvular gradient was 3 mm Hg. The patient's hemodynamic recovery following valve deployment is good.  The deployment  balloon and guidewire are both removed.    PROCEDURE COMPLETION:   The sheath was  removed and femoral artery closure performed.  Protamine was administered once femoral arterial repair was complete. The temporary pacemaker, pigtail catheters and femoral sheaths were removed with manual pressure used for hemostasis.  A Mynx femoral closure device was utilized following removal of the diagnostic sheath in the left femoral artery.  The patient tolerated the procedure well and is transported to the cath lab recovery area in stable condition. There were no immediate intraoperative complications. All sponge instrument and needle counts are verified correct at completion of the operation.   No blood products were administered during the operation.  The patient received a total of 40 mL of intravenous contrast during the procedure.   Gaye Pollack, MD 12/23/2019

## 2019-12-23 NOTE — Interval H&P Note (Signed)
History and Physical Interval Note:  12/23/2019 7:04 AM  Jose [name]  has presented today for surgery, with the diagnosis of aortic stensis.  The various methods of treatment have been discussed with the patient and family. After consideration of risks, benefits and other options for treatment, the patient has consented to  Procedure(s): TRANSCATHETER AORTIC VALVE REPLACEMENT, TRANSFEMORAL (N/A) as a surgical intervention.  The patient's history has been reviewed, patient examined, no change in status, stable for surgery.  I have reviewed the patient's chart and labs.  Questions were answered to the patient's satisfaction.     Gaye Pollack

## 2019-12-23 NOTE — Progress Notes (Signed)
Pt received from the PACU to 4E room 8 after TAVR w/cooper.  Telemetry monitor applied and CCMD notified.  CHG bath done.  Patient oriented to unit and room to include call light and phone.  Will continue to monitor.

## 2019-12-23 NOTE — Anesthesia Procedure Notes (Signed)
Procedure Name: MAC Date/Time: 12/23/2019 7:40 AM Performed by: Amadeo Garnet, CRNA Pre-anesthesia Checklist: Patient identified, Emergency Drugs available, Suction available, Patient being monitored and Timeout performed Patient Re-evaluated:Patient Re-evaluated prior to induction Oxygen Delivery Method: Simple face mask Induction Type: IV induction Placement Confirmation: positive ETCO2 Dental Injury: Teeth and Oropharynx as per pre-operative assessment

## 2019-12-23 NOTE — Progress Notes (Signed)
  Echocardiogram 2D Echocardiogram has been performed.  Jose [name] 12/23/2019, 9:26 AM

## 2019-12-23 NOTE — Anesthesia Procedure Notes (Signed)
Arterial Line Insertion Start/End1/04/2020 6:55 AM, 12/23/2019 7:02 AM Performed by: Amadeo Garnet, CRNA, CRNA  Patient location: Pre-op. Preanesthetic checklist: patient identified, IV checked, site marked, risks and benefits discussed, surgical consent, monitors and equipment checked, pre-op evaluation and timeout performed Lidocaine 1% used for infiltration and patient sedated Right, radial was placed Catheter size: 20 G Hand hygiene performed  and maximum sterile barriers used   Attempts: 1 Procedure performed using ultrasound guided technique. Following insertion, dressing applied and Biopatch. Patient tolerated the procedure well with no immediate complications.

## 2019-12-23 NOTE — Progress Notes (Signed)
Pt ambulated in hallway approximately 450 feet with front wheeled walker without difficulty.  Pt tolerated ambulation well.  Will continue to monitor.

## 2019-12-23 NOTE — Transfer of Care (Signed)
Immediate Anesthesia Transfer of Care Note  Patient: Kaimen A Lippe  Procedure(s) Performed: TRANSCATHETER AORTIC VALVE REPLACEMENT, TRANSFEMORAL (N/A Chest)  Patient Location: Cath Lab  Anesthesia Type:MAC  Level of Consciousness: awake, alert  and oriented  Airway & Oxygen Therapy: Patient Spontanous Breathing and Patient connected to face mask oxygen  Post-op Assessment: Report given to RN, Post -op Vital signs reviewed and stable and Patient moving all extremities  Post vital signs: Reviewed and stable  Last Vitals:  Vitals Value Taken Time  BP    Temp    Pulse 66 12/23/19 0940  Resp    SpO2 98 % 12/23/19 0940  Vitals shown include unvalidated device data.  Last Pain:  Vitals:   12/23/19 0615  PainSc: 0-No pain      Patients Stated Pain Goal: 5 (76/81/15 7262)  Complications: No apparent anesthesia complications

## 2019-12-23 NOTE — Op Note (Signed)
[facility]   MULTIDISCIPLINARY HEART VALVE TEAM   TAVR OPERATIVE NOTE   Date of Procedure:  12/23/2019  Preoperative Diagnosis: Severe Aortic Stenosis   Postoperative Diagnosis: Same   Procedure:    Transcatheter Aortic Valve Replacement - Percutaneous Transfemoral Approach  Edwards Sapien 3 UltraTHV (size 26 mm, model # 9750TFX, serial # [id number])   Co-Surgeons:  Gaye Pollack, MD and Sherren Mocha, MD  Anesthesiologist:  Duane Boston, MD  Echocardiographer:  [name], MD  Pre-operative Echo Findings:  Severe bicuspid aortic stenosis  Normal left ventricular systolic function  Post-operative Echo Findings:  No paravalvular leak  Normal left ventricular systolic function  BRIEF CLINICAL NOTE AND INDICATIONS FOR SURGERY  69 y.o. male presented for evaluation of severe aortic stenosis. The patient has been followed for a dilated ascending aorta now for several years.  He has a known bicuspid aortic valve and has developed progressive aortic stenosis.  His mean gradient has increased from 22 mmHg and 2018 to 45 mmHg in 2020.  The peak transaortic velocity is now in excess of 4 m/s and his LVEF remains preserved at 55 to 60%.  The patient has undergone extensive evaluation by Dr. Johnsie Cancel as well as multidisciplinary team evaluation by both Dr. Servando Snare and Dr. Cyndia Bent with cardiac surgery.  After review of all treatment options, he has elected to proceed with TAVR for treatment of severe aortic stenosis.  He has undergone diagnostic right and left heart catheterization, CTA studies, and echo evaluation.   During the course of the patient's preoperative work up they have been evaluated comprehensively by a multidisciplinary team of specialists coordinated through the [facility] the [facility].  They have been demonstrated to suffer from symptomatic severe aortic stenosis as noted above. The patient has been  counseled extensively as to the relative risks and benefits of all options for the treatment of severe aortic stenosis including long term medical therapy, conventional surgery for aortic valve replacement, and transcatheter aortic valve replacement.  The patient has been independently evaluated in formal cardiac surgical consultation by Dr Cyndia Bent, who deemed the patient appropriate for TAVR. Based upon review of all of the patient's preoperative diagnostic tests they are felt to be candidate for transcatheter aortic valve replacement using the transfemoral approach as an alternative to conventional surgery.    Following the decision to proceed with transcatheter aortic valve replacement, a discussion has been held regarding what types of management strategies would be attempted intraoperatively in the event of life-threatening complications, including whether or not the patient would be considered a candidate for the use of cardiopulmonary bypass and/or conversion to open sternotomy for attempted surgical intervention.  The patient has been advised of a variety of complications that might develop peculiar to this approach including but not limited to risks of death, stroke, paravalvular leak, aortic dissection or other major vascular complications, aortic annulus rupture, device embolization, cardiac rupture or perforation, acute myocardial infarction, arrhythmia, heart block or bradycardia requiring permanent pacemaker placement, congestive heart failure, respiratory failure, renal failure, pneumonia, infection, other late complications related to structural valve deterioration or migration, or other complications that might ultimately cause a temporary or permanent loss of functional independence or other long term morbidity.  The patient provides full informed consent for the procedure as described and all questions were answered preoperatively.  DETAILS OF THE OPERATIVE PROCEDURE  PREPARATION:   The  patient is brought to the operating room on the  above mentioned date and central monitoring was established by the anesthesia team including placement of a central venous catheter and radial arterial line. The patient is placed in the supine position on the operating table.  Intravenous antibiotics are administered. The patient is monitored closely throughout the procedure under conscious sedation.  Baseline transthoracic echocardiogram is performed. The patient's chest, abdomen, both groins, and both lower extremities are prepared and draped in a sterile manner. A time out procedure is performed.   PERIPHERAL ACCESS:   Using ultrasound guidance, femoral arterial and venous access is obtained with placement of 6 Fr sheaths on the left side.  A pigtail diagnostic catheter was passed through the femoral arterial sheath under fluoroscopic guidance into the aortic root.  A temporary transvenous pacemaker catheter was passed through the femoral venous sheath under fluoroscopic guidance into the right ventricle.  The pacemaker was tested to ensure stable lead placement and pacemaker capture. Aortic root angiography was performed in order to determine the optimal angiographic angle for valve deployment.  TRANSFEMORAL ACCESS:  A micropuncture technique is used to access the right femoral artery under fluoroscopic and ultrasound guidance.  2 Perclose devices are deployed at 10' and 2' positions to 'PreClose' the femoral artery. An 8 French sheath is placed and then an Amplatz Superstiff wire is advanced through the sheath. This is changed out for a 14 French transfemoral E-Sheath after progressively dilating over the Superstiff wire.  An AL-2 catheter was used to direct a straight-tip exchange length wire across the native aortic valve into the left ventricle. This was exchanged out for a pigtail catheter and position was confirmed in the LV apex. Simultaneous LV and Ao pressures were recorded.  The pigtail catheter  was exchanged for an Amplatz Extra-stiff wire in the LV apex.    BALLOON AORTIC VALVULOPLASTY:  Not performed  TRANSCATHETER HEART VALVE DEPLOYMENT:  An Edwards Sapien 3 Ultra transcatheter heart valve (size 26 mm) was prepared and crimped per manufacturer's guidelines, and the proper orientation of the valve is confirmed on the [employer] delivery system. The valve was advanced through the introducer sheath using normal technique until in an appropriate position in the abdominal aorta beyond the sheath tip. The balloon was then retracted and using the fine-tuning wheel was centered on the valve. The valve was then advanced across the aortic arch using appropriate flexion of the catheter. The valve was carefully positioned across the aortic valve annulus. The Commander catheter was retracted using normal technique. Once final position of the valve has been confirmed by angiographic assessment, the valve is deployed while temporarily holding ventilation and during rapid ventricular pacing to maintain systolic blood pressure < 50 mmHg and pulse pressure < 10 mmHg. The balloon inflation is held for >3 seconds after reaching full deployment volume. Once the balloon has fully deflated the balloon is retracted into the ascending aorta and valve function is assessed using echocardiography. The patient's hemodynamic recovery following valve deployment is good.  The deployment balloon and guidewire are both removed. Echo demostrated acceptable post-procedural gradients, stable mitral valve function, and no aortic insufficiency.    PROCEDURE COMPLETION:  The sheath was removed and femoral artery closure is performed using the 2 previously deployed Perclose devices.  Protamine is administered once femoral arterial repair was complete. The site is clear with no evidence of bleeding or hematoma after the sutures are tightened. The temporary pacemaker and pigtail catheters are removed. Mynx closure is used for  left femoral artery hemostasis.   The  patient tolerated the procedure well and is transported to the surgical intensive care in stable condition. There were no immediate intraoperative complications. All sponge instrument and needle counts are verified correct at completion of the operation.   The patient received a total of 40 mL of intravenous contrast during the procedure.   Sherren Mocha, MD 12/23/2019 9:21 AM

## 2019-12-24 ENCOUNTER — Inpatient Hospital Stay (HOSPITAL_COMMUNITY): Payer: Medicare Other

## 2019-12-24 DIAGNOSIS — Z952 Presence of prosthetic heart valve: Secondary | ICD-10-CM

## 2019-12-24 DIAGNOSIS — I350 Nonrheumatic aortic (valve) stenosis: Principal | ICD-10-CM

## 2019-12-24 LAB — CBC
HCT: 41.6 % (ref 39.0–52.0)
Hemoglobin: 13.9 g/dL (ref 13.0–17.0)
MCH: 31.4 pg (ref 26.0–34.0)
MCHC: 33.4 g/dL (ref 30.0–36.0)
MCV: 93.9 fL (ref 80.0–100.0)
Platelets: 147 10*3/uL — ABNORMAL LOW (ref 150–400)
RBC: 4.43 MIL/uL (ref 4.22–5.81)
RDW: 14.2 % (ref 11.5–15.5)
WBC: 11.8 10*3/uL — ABNORMAL HIGH (ref 4.0–10.5)
nRBC: 0 % (ref 0.0–0.2)

## 2019-12-24 LAB — BASIC METABOLIC PANEL
Anion gap: 7 (ref 5–15)
BUN: 14 mg/dL (ref 8–23)
CO2: 27 mmol/L (ref 22–32)
Calcium: 9 mg/dL (ref 8.9–10.3)
Chloride: 105 mmol/L (ref 98–111)
Creatinine, Ser: 1.12 mg/dL (ref 0.61–1.24)
GFR calc Af Amer: >60 mL/min (ref >60)
GFR calc non Af Amer: >60 mL/min (ref >60)
Glucose, Bld: 105 mg/dL — ABNORMAL HIGH (ref 70–99)
Potassium: 4.5 mmol/L (ref 3.5–5.1)
Sodium: 139 mmol/L (ref 135–145)

## 2019-12-24 LAB — POCT I-STAT, CHEM 8
BUN: 18 mg/dL (ref 8–23)
BUN: 17 mg/dL (ref 8–23)
Calcium, Ion: 1.24 mmol/L (ref 1.15–1.40)
Calcium, Ion: 1.24 mmol/L (ref 1.15–1.40)
Chloride: 105 mmol/L (ref 98–111)
Chloride: 104 mmol/L (ref 98–111)
Creatinine, Ser: 1 mg/dL (ref 0.61–1.24)
Creatinine, Ser: 1 mg/dL (ref 0.61–1.24)
Glucose, Bld: 107 mg/dL — ABNORMAL HIGH (ref 70–99)
Glucose, Bld: 116 mg/dL — ABNORMAL HIGH (ref 70–99)
HCT: 39 % (ref 39.0–52.0)
HCT: 36 % — ABNORMAL LOW (ref 39.0–52.0)
Hemoglobin: 13.3 g/dL (ref 13.0–17.0)
Hemoglobin: 12.2 g/dL — ABNORMAL LOW (ref 13.0–17.0)
Potassium: 4 mmol/L (ref 3.5–5.1)
Potassium: 4.2 mmol/L (ref 3.5–5.1)
Sodium: 141 mmol/L (ref 135–145)
Sodium: 140 mmol/L (ref 135–145)
TCO2: 25 mmol/L (ref 22–32)
TCO2: 26 mmol/L (ref 22–32)

## 2019-12-24 LAB — MAGNESIUM: Magnesium: 1.6 mg/dL — ABNORMAL LOW (ref 1.7–2.4)

## 2019-12-24 MED ORDER — TRAMADOL HCL 50 MG PO TABS: 50.0000 mg | ORAL_TABLET | ORAL | 0 refills | Status: DC | PRN

## 2019-12-24 MED ORDER — ASPIRIN 81 MG PO CHEW: 81.0000 mg | CHEWABLE_TABLET | Freq: Every day | ORAL | Status: AC

## 2019-12-24 MED ORDER — CLOPIDOGREL BISULFATE 75 MG PO TABS: 75.0000 mg | ORAL_TABLET | Freq: Every day | ORAL | 11 refills | Status: DC

## 2019-12-24 NOTE — Discharge Summary (Signed)
Discharge Summary    Patient ID: Jose [name] MRN: [id number]; DOB: [birth year]  Admit date: 12/23/2019 Discharge date: 12/24/2019  Primary Care Provider: [name], MD  Primary Cardiologist: [name], MD  Primary Electrophysiologist:  None  Structural Heart: Sherren Mocha, MD   Discharge Diagnoses    Active Problems:   Severe aortic stenosis   Allergies Allergies  Allergen Reactions  . Ciprofloxacin Other (See Comments)    Patient states instructed not to take due to history of Aneurysms  . Quinolones     Patient was warned about not using Cipro and similar antibiotics. Recent studies have raised concern that fluoroquinolone antibiotics could be associated with an increased risk of aortic aneurysm Fluoroquinolones have non-antimicrobial properties that might jeopardise the integrity of the extracellular matrix of the vascular wall In a  propensity score matched cohort study in [place], there was a 66% increased rate of aortic aneurysm or dissection associated with oral fluoroquinolone use, compared wit    Diagnostic Studies/Procedures    ECHO: 12/24/2019 Results: 2D echo images personally reviewed.  LV function appears normal.  TAVR function appears normal with a mean gradient of 10 mmHg and no paravalvular regurgitation  Procedure:      12/23/2019   Transcatheter Aortic Valve Replacement - Percutaneous Transfemoral Approach             Edwards Sapien 3 UltraTHV (size 26 mm, model # 9750TFX, serial # [id number])              Co-Surgeons:                        Gaye Pollack, MD and Sherren Mocha, MD PERIPHERAL ACCESS:   Using ultrasound guidance, femoral arterial and venous access is obtained with placement of 6 Fr sheaths on the left side.  A pigtail diagnostic catheter was passed through the femoral arterial sheath under fluoroscopic guidance into the aortic root.  A temporary transvenous pacemaker catheter was passed through the femoral venous sheath under  fluoroscopic guidance into the right ventricle.  The pacemaker was tested to ensure stable lead placement and pacemaker capture. Aortic root angiography was performed in order to determine the optimal angiographic angle for valve deployment.  TRANSFEMORAL ACCESS:  A micropuncture technique is used to access the right femoral artery under fluoroscopic and ultrasound guidance.  2 Perclose devices are deployed at 10' and 2' positions to 'PreClose' the femoral artery. An 8 French sheath is placed and then an Amplatz Superstiff wire is advanced through the sheath. This is changed out for a 14 French transfemoral E-Sheath after progressively dilating over the Superstiff wire.  An AL-2 catheter was used to direct a straight-tip exchange length wire across the native aortic valve into the left ventricle. This was exchanged out for a pigtail catheter and position was confirmed in the LV apex. Simultaneous LV and Ao pressures were recorded.  The pigtail catheter was exchanged for an Amplatz Extra-stiff wire in the LV apex.    BALLOON AORTIC VALVULOPLASTY:  Not performed  TRANSCATHETER HEART VALVE DEPLOYMENT:  An Edwards Sapien 3 Ultra transcatheter heart valve (size 26 mm) was prepared and crimped per manufacturer's guidelines, and the proper orientation of the valve is confirmed on the [employer] delivery system. The valve was advanced through the introducer sheath using normal technique until in an appropriate position in the abdominal aorta beyond the sheath tip. The balloon was then retracted and using the fine-tuning wheel was centered on the  valve. The valve was then advanced across the aortic arch using appropriate flexion of the catheter. The valve was carefully positioned across the aortic valve annulus. The Commander catheter was retracted using normal technique. Once final position of the valve has been confirmed by angiographic assessment, the valve is deployed while temporarily holding  ventilation and during rapid ventricular pacing to maintain systolic blood pressure < 50 mmHg and pulse pressure < 10 mmHg. The balloon inflation is held for >3 seconds after reaching full deployment volume. Once the balloon has fully deflated the balloon is retracted into the ascending aorta and valve function is assessed using echocardiography. The patient's hemodynamic recovery following valve deployment is good.  The deployment balloon and guidewire are both removed. Echo demostrated acceptable post-procedural gradients, stable mitral valve function, and no aortic insufficiency.    PROCEDURE COMPLETION:  The sheath was removed and femoral artery closure is performed using the 2 previously deployed Perclose devices.  Protamine is administered once femoral arterial repair was complete. The site is clear with no evidence of bleeding or hematoma after the sutures are tightened. The temporary pacemaker and pigtail catheters are removed. Mynx closure is used for left femoral artery hemostasis.   The patient tolerated the procedure well and is transported to the surgical intensive care in stable condition. There were no immediate intraoperative complications. All sponge instrument and needle counts are verified correct at completion of the operation.   The patient received a total of 40 mL of intravenous contrast during the procedure. _____________   History of Present Illness     Jose [name] is a 69 y.o. male with severe aortic stenosis.  The patient has been followed for a dilated ascending aorta now for several years.  He has a known bicuspid aortic valve and has developed progressive aortic stenosis.  His mean gradient has increased from 22 mmHg and 2018 to 45 mmHg in 2020.  The peak transaortic velocity is now in excess of 4 m/s and his LVEF remains preserved at 55 to 60%.  The patient has undergone extensive evaluation by Dr. Johnsie Cancel as well as multidisciplinary team evaluation by both Dr.  Servando Snare and Dr. Cyndia Bent with cardiac surgery.  After review of all treatment options, he has elected to proceed with TAVR for treatment of severe aortic stenosis.  He has undergone diagnostic right and left heart catheterization, CTA studies, and echo evaluation.    He has been physically limited because of quadricep problems over the past few years.  He had bilateral quadricep repairs and required redo left-sided quadricep surgery in July of this year.  He has been participating in rehab and was just recently released by his orthopedic surgeon in [place], [place] after he has made adequate progress.  The decision was made to proceed with TAVR.  He came to the hospital for the procedure on 12/23/2019.    Hospital Course     Consultants: Dr. Cyndia Bent  He tolerated the procedure well, no complications.  Post procedure, he was able to ambulate approximately 450 feet.  He did well overnight.  On 1/6, he ambulated with cardiac rehab greater than 1000 feet.  He was referred to cardiac rehab 2.  He is interested in participating in the [facility].  On 1/6 he was seen by Dr. Burt Knack and all data were reviewed.  He is to be on aspirin and Plavix at discharge.  Preliminary echo results showed normal LV function and a gradient of 10 mmHg across  the valve.  No further inpatient work-up is indicated and he is considered stable for discharge, with outpatient follow-up arranged.  _____________  Discharge Vitals Blood pressure (!) 140/94, pulse 85, temperature 99.3 F (37.4 C), temperature source Oral, resp. rate 17, height 6' (1.829 m), weight 92.5 kg, SpO2 95 %.  Filed Weights   12/23/19 0605 12/23/19 1430 12/24/19 0408  Weight: 89.8 kg 98.1 kg 92.5 kg    Labs & Radiologic Studies    CBC Recent Labs    12/23/19 1014 12/24/19 0330  WBC  --  11.8*  HGB 12.6* 13.9  HCT 37.0* 41.6  MCV  --  93.9  PLT  --  [id number]*   Basic Metabolic Panel Recent Labs     12/23/19 1014 12/24/19 0330  NA 140 139  K 4.1 4.5  CL 105 105  CO2  --  27  GLUCOSE 114* 105*  BUN 17 14  CREATININE 1.00 1.12  CALCIUM  --  9.0  MG  --  1.6*   _____________  DG Chest 2 View  Result Date: 12/18/2019 CLINICAL DATA:  Preop chest x-ray. Patient is scheduled for TAVR 12/23/2019 EXAM: CHEST - 2 VIEW COMPARISON:  Chest CT 11/03/2019 FINDINGS: The cardiac silhouette, mediastinal and hilar contours are within normal limits and stable. The lungs are clear. No pleural effusions. No pulmonary lesions. The bony thorax is intact. IMPRESSION: No acute cardiopulmonary findings. Electronically Signed   By: Marijo Sanes M.D.   On: 12/18/2019 16:13   DG Chest Port 1 View  Result Date: 12/23/2019 CLINICAL DATA:  Aortic valve replacement. EXAM: PORTABLE CHEST 1 VIEW COMPARISON:  Chest x-ray 12/18/2019. FINDINGS: Mediastinum and hilar structures normal. Aortic valve replacement. Cardiomegaly, no pulmonary venous congestion. No focal pulmonary infiltrate. No pleural effusion or pneumothorax. No acute bony abnormality. IMPRESSION: 1. Aortic valve replacement. Cardiomegaly. No pulmonary venous congestion. 2.  No focal pulmonary infiltrate. Electronically Signed   By: Marcello Moores  Register   On: 12/23/2019 15:23   VAS US CAROTID  Result Date: 12/18/2019 Carotid Arterial Duplex Study Indications:       Preop for TAVR. Risk Factors:      Coronary artery disease. Comparison Study:  No priors. Performing Technologist: Oda Cogan RDMS, RVT  Examination Guidelines: A complete evaluation includes B-mode imaging, spectral Doppler, color Doppler, and power Doppler as needed of all accessible portions of each vessel. Bilateral testing is considered an integral part of a complete examination. Limited examinations for reoccurring indications may be performed as noted.  Right Carotid Findings: +----------+--------+--------+--------+------------------+------------------+           PSV cm/sEDV  cm/sStenosisPlaque DescriptionComments           +----------+--------+--------+--------+------------------+------------------+ CCA Prox  56      22                                                   +----------+--------+--------+--------+------------------+------------------+ CCA Distal57      27                                                   +----------+--------+--------+--------+------------------+------------------+ ICA Prox  51      18      1-39%  intimal thickening +----------+--------+--------+--------+------------------+------------------+ ICA Distal67      29                                                   +----------+--------+--------+--------+------------------+------------------+ ECA       74      25                                                   +----------+--------+--------+--------+------------------+------------------+ +----------+--------+-------+----------------+-------------------+           PSV cm/sEDV cmsDescribe        Arm Pressure (mmHG) +----------+--------+-------+----------------+-------------------+ [id number]            Multiphasic, WNL                    +----------+--------+-------+----------------+-------------------+ +---------+--------+--+--------+--+---------+ VertebralPSV cm/s59EDV cm/s25Antegrade +---------+--------+--+--------+--+---------+  Left Carotid Findings: +----------+--------+--------+--------+------------------+--------+           PSV cm/sEDV cm/sStenosisPlaque DescriptionComments +----------+--------+--------+--------+------------------+--------+ CCA Prox  67      28                                         +----------+--------+--------+--------+------------------+--------+ CCA Distal68      29                                         +----------+--------+--------+--------+------------------+--------+ ICA Prox  44      20      1-39%   homogeneous                 +----------+--------+--------+--------+------------------+--------+ ICA Distal57      30                                         +----------+--------+--------+--------+------------------+--------+ ECA       74      25                                         +----------+--------+--------+--------+------------------+--------+ +----------+--------+--------+----------------+-------------------+           PSV cm/sEDV cm/sDescribe        Arm Pressure (mmHG) +----------+--------+--------+----------------+-------------------+ [id number]              Multiphasic, WNL                    +----------+--------+--------+----------------+-------------------+ +---------+--------+--+--------+--+---------+ VertebralPSV cm/s35EDV cm/s14Antegrade +---------+--------+--+--------+--+---------+  Summary: Right Carotid: Velocities in the right ICA are consistent with a 1-39% stenosis. Left Carotid: Velocities in the left ICA are consistent with a 1-39% stenosis. Vertebrals: Bilateral vertebral arteries demonstrate antegrade flow. *See table(s) above for measurements and observations.  Electronically signed by Deitra Mayo MD on 12/18/2019 at 1:29:03 PM.    Final    ECHOCARDIOGRAM LIMITED  Result Date: 12/23/2019   ECHOCARDIOGRAM LIMITED REPORT   Patient Name:   Jose [name] Date of  Exam: 12/23/2019 Medical Rec #:  [id number]       Height:       72.0 in Accession #:    [id number]      Weight:       198.0 lb Date of Birth:  24-Mar-1951       BSA:          2.12 m Patient Age:    69 years        BP:           164/99 mmHg Patient Gender: M               HR:           59 bpm. Exam Location:  Inpatient  Procedure: Limited Echo, Color Doppler and Cardiac Doppler Indications:     Aortic stenosis  History:         Patient has prior history of Echocardiogram examinations, most                  recent 12/19/2019. Aortic Valve: A 26 Medtronic, stented aortic                  valve (TAVR) Procedure Date:  12/19/2019  Sonographer:     Dustin Flock Referring Phys:  [id number] [place] R THOMPSON Diagnosing Phys: [name] MD IMPRESSIONS  1. The left ventricle has normal function. Left ventricular septal wall thickness was mildly increased. There is mildly increased left ventricular wall thickness.  2. The left ventricle is not well visualized.  3. Global right ventricle has normal systolc function.The right ventricular size is normal. no increase in right ventricular wall thickness.  4. Left atrial size was not assessed.  5. The mitral valve was not assessed.  6. The tricuspid valve was not assessed.  7. Severe aortic valve stenosis.  8. Pre TAVR: trileaflet AV with severe AS Gradients low do to imaging supine in OR Peak velocity 3 m/sec mean gradient 20 mmHg peak 34 mmHg AVA 0.8 cm2         Post TAVR : well placed 26 mm Sapien 3 valve with no PVL. Peak velocity <42m/sec mean/peak gradients 1 mmHg AVA 1.4 cm2.  9. Aortic root could not be assessed. 10. The interatrial septum was not assessed. FINDINGS  Left Ventricle: The left ventricle has normal function. The left ventricle is not well visualized. The left ventricular internal cavity size was the left ventricle is normal in size. Left ventricular septal wall thickness was mildly increased. There is mildly increased left ventricular wall thickness. Right Ventricle: The right ventricular size is normal. No increase in right ventricular wall thickness. Global RV systolic function is has normal systolic function. Left Atrium: Left atrial size was not assessed. Right Atrium: Right atrial size was not assessed. Pericardium: There is no evidence of pericardial effusion is seen. There is no evidence of pericardial effusion. Mitral Valve: The mitral valve was not assessed. Tricuspid Valve: The tricuspid valve is not assessed. Aortic Valve: Aortic valve regurgitation is not visualized. Severe aortic stenosis is present. Aortic valve mean gradient measures 4.0 mmHg. Aortic  valve peak gradient measures 8.1 mmHg. Aortic valve area, by VTI measures 1.25 cm. 26 Medtronic, stented aortic valve (TAVR) valve is present in the aortic position. Procedure Date: 12/19/2019. Pre TAVR: trileaflet AV with severe AS Gradients low do to imaging supine in OR Peak velocity 3 m/sec mean gradient 20 mmHg peak 34 mmHg AVA 0.8 cm2 Post TAVR : well placed 26 mm Sapien  3 valve with no PVL. Peak velocity <59m/sec mean/peak gradients 1 mmHg AVA 1.4 cm2. Pulmonic Valve: The pulmonic valve was not assessed. Aorta: Aortic root could not be assessed. Shunts: The interatrial septum was not assessed.  LEFT VENTRICLE          Normals PLAX 2D LVOT diam:     2.20 cm  2.0 cm LVOT Area:     3.80 cm 3.14 cm2  AORTIC VALVE                   Normals AV Area (Vmax):    1.42 cm AV Area (Vmean):   1.27 cm    3.06 cm2 AV Area (VTI):     1.25 cm AV Vmax:           142.00 cm/s AV Vmean:          98.700 cm/s 77 cm/s AV VTI:            0.374 m     3.15 cm2 AV Peak Grad:      8.1 mmHg AV Mean Grad:      4.0 mmHg    3 mmHg LVOT Vmax:         53.20 cm/s LVOT Vmean:        32.900 cm/s 75 cm/s LVOT VTI:          0.123 m     25.3 cm LVOT/AV VTI ratio: 0.33        1  SHUNTS Systemic VTI:  0.12 m Systemic Diam: 2.20 cm  [name] MD Electronically signed by [name] MD Signature Date/Time: 12/23/2019/10:47:28 AMThe mitral valve was not assessed.    Final    Structural Heart Procedure  Result Date: 12/23/2019 See surgical note for result.  Disposition   Pt is being discharged home today in good condition.  Follow-up Plans & Appointments     Discharge Instructions    Amb Referral to Cardiac Rehabilitation   Complete by: As directed    Diagnosis: Valve Replacement   Valve: Aortic Comment - TAVR   After initial evaluation and assessments completed: Virtual Based Care may be provided alone or in conjunction with Phase 2 Cardiac Rehab based on patient barriers.: Yes   Diet - low sodium heart healthy   Complete by: As  directed    Increase activity slowly   Complete by: As directed       Discharge Medications   Allergies as of 12/24/2019      Reactions   Ciprofloxacin Other (See Comments)   Patient states instructed not to take due to history of Aneurysms   Quinolones    Patient was warned about not using Cipro and similar antibiotics. Recent studies have raised concern that fluoroquinolone antibiotics could be associated with an increased risk of aortic aneurysm Fluoroquinolones have non-antimicrobial properties that might jeopardise the integrity of the extracellular matrix of the vascular wall In a  propensity score matched cohort study in [place], there was a 66% increased rate of aortic aneurysm or dissection associated with oral fluoroquinolone use, compared wit      Medication List    TAKE these medications   allopurinol 300 MG tablet Commonly known as: ZYLOPRIM TAKE 1 TABLET BY MOUTH EVERY DAY   aspirin 81 MG chewable tablet Chew 1 tablet (81 mg total) by mouth daily. Start taking on: December 25, 2019   atorvastatin 20 MG tablet Commonly known as: Lipitor Take 1 tablet (20 mg total) by mouth daily at  6 PM.   clopidogrel 75 MG tablet Commonly known as: PLAVIX Take 1 tablet (75 mg total) by mouth daily with breakfast. Start taking on: December 25, 2019   esomeprazole 20 MG capsule Commonly known as: NEXIUM Take 20 mg by mouth daily at 12 noon.   metoprolol succinate 25 MG 24 hr tablet Commonly known as: TOPROL-XL Take 25 mg by mouth daily.   multivitamin with minerals Tabs tablet Take 1 tablet by mouth daily.   traMADol 50 MG tablet Commonly known as: ULTRAM Take 1-2 tablets (50-100 mg total) by mouth every 4 (four) hours as needed for moderate pain.          Outstanding Labs/Studies   Echocardiogram final results  Duration of Discharge Encounter   Greater than 30 minutes including physician time.  Signed, Rosaria Ferries, PA-C 12/24/2019, 11:59 AM

## 2019-12-24 NOTE — Progress Notes (Signed)
Progress Note  Patient Name: Jose [name] Date of Encounter: 12/24/2019  Primary Cardiologist: [name], MD   Subjective   Feels well.  No chest pain or shortness of breath.  He has ambulated in the halls without problems.  Inpatient Medications    Scheduled Meds: . allopurinol  300 mg Oral Daily  . aspirin  81 mg Oral Daily  . atorvastatin  20 mg Oral q1800  . clopidogrel  75 mg Oral Q breakfast  . metoprolol succinate  25 mg Oral Daily  . multivitamin with minerals  1 tablet Oral Daily  . pantoprazole  40 mg Oral Daily  . sodium chloride flush  3 mL Intravenous Q12H   Continuous Infusions: . sodium chloride    . cefUROXime (ZINACEF)  IV Stopped (12/24/19 0527)  . nitroGLYCERIN    . phenylephrine (NEO-SYNEPHRINE) Adult infusion     PRN Meds: sodium chloride, acetaminophen **OR** acetaminophen, metoprolol tartrate, morphine injection, ondansetron (ZOFRAN) IV, oxyCODONE, sodium chloride flush, traMADol   Vital Signs    Vitals:   12/24/19 0000 12/24/19 0345 12/24/19 0408 12/24/19 0819  BP: (!) 138/96 126/88  (!) 140/94  Pulse: 85 85    Resp: 14 16  17   Temp: 98.4 F (36.9 C) 99.3 F (37.4 C)  99.3 F (37.4 C)  TempSrc: Oral Oral  Oral  SpO2: 100% 100%  95%  Weight:   92.5 kg   Height:        Intake/Output Summary (Last 24 hours) at 12/24/2019 1106 Last data filed at 12/24/2019 0530 Gross per 24 hour  Intake 1040 ml  Output 1575 ml  Net -535 ml   Last 3 Weights 12/24/2019 12/23/2019 12/23/2019  Weight (lbs) 203 lb 14.4 oz 216 lb 4.3 oz 198 lb  Weight (kg) 92.488 kg 98.1 kg 89.812 kg      Telemetry    Normal sinus rhythm without arrhythmia- Personally Reviewed  ECG    Normal sinus rhythm 63 bpm, within normal limits- Personally Reviewed  Physical Exam  Alert, oriented male in no distress GEN: No acute distress.   Neck: No JVD Cardiac: RRR, no murmurs, rubs, or gallops.  Respiratory: Clear to auscultation bilaterally. GI: Soft, nontender,  non-distended  MS: No edema; No deformity.  Bilateral groin sites clear without hematoma or ecchymosis Neuro:  Nonfocal  Psych: Normal affect   Labs    High Sensitivity Troponin:  No results for input(s): TROPONINIHS in the last 720 hours.    Chemistry Recent Labs  Lab 12/18/19 1102 12/23/19 0852 12/23/19 1014 12/24/19 0330  NA 141 140 140 139  K 4.2 4.2 4.1 4.5  CL 108 104 105 105  CO2 22  --   --  27  GLUCOSE 102* 116* 114* 105*  BUN 20 17 17 14   CREATININE 1.13 1.00 1.00 1.12  CALCIUM 9.1  --   --  9.0  PROT 6.8  --   --   --   ALBUMIN 3.9  --   --   --   AST 27  --   --   --   ALT 31  --   --   --   ALKPHOS 65  --   --   --   BILITOT 0.5  --   --   --   GFRNONAA >60  --   --  >60  GFRAA >60  --   --  >60  ANIONGAP 11  --   --  7  Hematology Recent Labs  Lab 12/18/19 1102 12/23/19 0852 12/23/19 1014 12/24/19 0330  WBC 7.1  --   --  11.8*  RBC 4.69  --   --  4.43  HGB 14.7 12.2* 12.6* 13.9  HCT 44.4 36.0* 37.0* 41.6  MCV 94.7  --   --  93.9  MCH 31.3  --   --  31.4  MCHC 33.1  --   --  33.4  RDW 14.3  --   --  14.2  PLT 188  --   --  147*    BNP Recent Labs  Lab 12/18/19 1102  BNP 42.1     DDimer No results for input(s): DDIMER in the last 168 hours.   Radiology    DG Chest Port 1 View  Result Date: 12/23/2019 CLINICAL DATA:  Aortic valve replacement. EXAM: PORTABLE CHEST 1 VIEW COMPARISON:  Chest x-ray 12/18/2019. FINDINGS: Mediastinum and hilar structures normal. Aortic valve replacement. Cardiomegaly, no pulmonary venous congestion. No focal pulmonary infiltrate. No pleural effusion or pneumothorax. No acute bony abnormality. IMPRESSION: 1. Aortic valve replacement. Cardiomegaly. No pulmonary venous congestion. 2.  No focal pulmonary infiltrate. Electronically Signed   By: Marcello Moores  Register   On: 12/23/2019 15:23   ECHOCARDIOGRAM LIMITED  Result Date: 12/23/2019   ECHOCARDIOGRAM LIMITED REPORT   Patient Name:   Jose [name] Date of Exam:  12/23/2019 Medical Rec #:  [id number]       Height:       72.0 in Accession #:    [id number]      Weight:       198.0 lb Date of Birth:  1951-11-01       BSA:          2.12 m Patient Age:    69 years        BP:           164/99 mmHg Patient Gender: M               HR:           59 bpm. Exam Location:  Inpatient  Procedure: Limited Echo, Color Doppler and Cardiac Doppler Indications:     Aortic stenosis  History:         Patient has prior history of Echocardiogram examinations, most                  recent 12/19/2019. Aortic Valve: A 26 Medtronic, stented aortic                  valve (TAVR) Procedure Date: 12/19/2019  Sonographer:     Dustin Flock Referring Phys:  [id number] [place] R THOMPSON Diagnosing Phys: [name] MD IMPRESSIONS  1. The left ventricle has normal function. Left ventricular septal wall thickness was mildly increased. There is mildly increased left ventricular wall thickness.  2. The left ventricle is not well visualized.  3. Global right ventricle has normal systolc function.The right ventricular size is normal. no increase in right ventricular wall thickness.  4. Left atrial size was not assessed.  5. The mitral valve was not assessed.  6. The tricuspid valve was not assessed.  7. Severe aortic valve stenosis.  8. Pre TAVR: trileaflet AV with severe AS Gradients low do to imaging supine in OR Peak velocity 3 m/sec mean gradient 20 mmHg peak 34 mmHg AVA 0.8 cm2         Post TAVR : well placed 26 mm Sapien 3 valve with no PVL.  Peak velocity <43m/sec mean/peak gradients 1 mmHg AVA 1.4 cm2.  9. Aortic root could not be assessed. 10. The interatrial septum was not assessed. FINDINGS  Left Ventricle: The left ventricle has normal function. The left ventricle is not well visualized. The left ventricular internal cavity size was the left ventricle is normal in size. Left ventricular septal wall thickness was mildly increased. There is mildly increased left ventricular wall thickness. Right Ventricle:  The right ventricular size is normal. No increase in right ventricular wall thickness. Global RV systolic function is has normal systolic function. Left Atrium: Left atrial size was not assessed. Right Atrium: Right atrial size was not assessed. Pericardium: There is no evidence of pericardial effusion is seen. There is no evidence of pericardial effusion. Mitral Valve: The mitral valve was not assessed. Tricuspid Valve: The tricuspid valve is not assessed. Aortic Valve: Aortic valve regurgitation is not visualized. Severe aortic stenosis is present. Aortic valve mean gradient measures 4.0 mmHg. Aortic valve peak gradient measures 8.1 mmHg. Aortic valve area, by VTI measures 1.25 cm. 26 Medtronic, stented aortic valve (TAVR) valve is present in the aortic position. Procedure Date: 12/19/2019. Pre TAVR: trileaflet AV with severe AS Gradients low do to imaging supine in OR Peak velocity 3 m/sec mean gradient 20 mmHg peak 34 mmHg AVA 0.8 cm2 Post TAVR : well placed 26 mm Sapien 3 valve with no PVL. Peak velocity <6m/sec mean/peak gradients 1 mmHg AVA 1.4 cm2. Pulmonic Valve: The pulmonic valve was not assessed. Aorta: Aortic root could not be assessed. Shunts: The interatrial septum was not assessed.  LEFT VENTRICLE          Normals PLAX 2D LVOT diam:     2.20 cm  2.0 cm LVOT Area:     3.80 cm 3.14 cm2  AORTIC VALVE                   Normals AV Area (Vmax):    1.42 cm AV Area (Vmean):   1.27 cm    3.06 cm2 AV Area (VTI):     1.25 cm AV Vmax:           142.00 cm/s AV Vmean:          98.700 cm/s 77 cm/s AV VTI:            0.374 m     3.15 cm2 AV Peak Grad:      8.1 mmHg AV Mean Grad:      4.0 mmHg    3 mmHg LVOT Vmax:         53.20 cm/s LVOT Vmean:        32.900 cm/s 75 cm/s LVOT VTI:          0.123 m     25.3 cm LVOT/AV VTI ratio: 0.33        1  SHUNTS Systemic VTI:  0.12 m Systemic Diam: 2.20 cm  [name] MD Electronically signed by [name] MD Signature Date/Time: 12/23/2019/10:47:28 AMThe mitral valve was  not assessed.    Final    Structural Heart Procedure  Result Date: 12/23/2019 See surgical note for result.   Cardiac Studies   2D echo images personally reviewed.  LV function appears normal.  TAVR function appears normal with a mean gradient of 10 mmHg and no paravalvular regurgitation  Patient Profile     69 y.o. male with severe bicuspid aortic valve stenosis, presenting for TAVR 12/23/2019  Assessment & Plan    Severe bicuspid  aortic valve stenosis: Patient status post TAVR with a 26 mm SAPIEN 3 valve without complication.  Telemetry shows normal sinus rhythm with no significant AV block or arrhythmia.  2D echo images reviewed as above with normal transcatheter heart valve function.  The patient is stable for hospital discharge today on aspirin and clopidogrel.  Postoperative instructions are discussed at length with the patient.  He is medically stable for discharge this morning.  For questions or updates, please contact [place] Please consult [email or web address] for contact info under     Signed, Sherren Mocha, MD  12/24/2019, 11:06 AM

## 2019-12-24 NOTE — Progress Notes (Signed)
  Echocardiogram 2D Echocardiogram has been performed.  Jose [name] 12/24/2019, 9:36 AM

## 2019-12-24 NOTE — Progress Notes (Signed)
CARDIAC REHAB PHASE I   Pt seen ambulating >1084ft in hallway with NT. Pt denies CP, or SOB. Pt educated on restrictions, site care, and exercise guidelines. Will refer to CRP II [place], pt aware CR will not start until after he completes his physical therapy. Pt for d/c today. Pt is interested in participating in Virtual Cardiac and Pulmonary Rehab. Pt advised that Virtual Cardiac and Pulmonary Rehab is provided at no cost to the patient.  Checklist:  1. Pt has smart device  ie smartphone and/or ipad for downloading an app  Yes 2. Reliable internet/wifi service    Yes 3. Understands how to use their smartphone and navigate within an app.  Yes  Pt verbalized understanding and is in agreement.   [id number] Rufina Falco, RN BSN 12/24/2019 10:41 AM

## 2019-12-25 ENCOUNTER — Encounter: Payer: Self-pay | Admitting: Anesthesiology

## 2019-12-25 ENCOUNTER — Telehealth: Payer: Self-pay

## 2019-12-25 NOTE — Telephone Encounter (Signed)
Patient contacted regarding discharge from [facility] on 12/24/2019.  Patient understands to follow up with provider Nell Range PA-C on 01/01/2020 at 3:30 PM at [facility] location. Patient understands discharge instructions? yes Patient understands medications and regiment? yes Patient understands to bring all medications to this visit? Yes  The pt is doing well and only notes muscle soreness in his legs at this time. Groin sites are okay.

## 2019-12-25 NOTE — Telephone Encounter (Signed)
Left message on machine for pt to contact the office.   

## 2019-12-25 NOTE — Anesthesia Postprocedure Evaluation (Signed)
Anesthesia Post Note  Patient: Jose [name]  Procedure(s) Performed: TRANSCATHETER AORTIC VALVE REPLACEMENT, TRANSFEMORAL (N/A Chest)     Patient location during evaluation: PACU Anesthesia Type: MAC Level of consciousness: awake and alert Pain management: pain level controlled Vital Signs Assessment: post-procedure vital signs reviewed and stable Respiratory status: spontaneous breathing and respiratory function stable Cardiovascular status: stable Postop Assessment: no apparent nausea or vomiting Anesthetic complications: no                   Diya Gervasi DANIEL

## 2019-12-26 ENCOUNTER — Telehealth (HOSPITAL_COMMUNITY): Payer: Self-pay

## 2019-12-26 LAB — ECHOCARDIOGRAM COMPLETE
Height: 72 in
Weight: 3262.4 oz

## 2019-12-26 MED FILL — Heparin Sodium (Porcine) Inj 1000 Unit/ML: INTRAMUSCULAR | Qty: 30 | Status: AC

## 2019-12-26 MED FILL — Magnesium Sulfate Inj 50%: INTRAMUSCULAR | Qty: 10 | Status: AC

## 2019-12-26 MED FILL — Potassium Chloride Inj 2 mEq/ML: INTRAVENOUS | Qty: 40 | Status: AC

## 2019-12-26 MED FILL — Cefuroxime Sodium For IV Soln 1.5 GM: INTRAVENOUS | Qty: 1.5 | Status: AC

## 2019-12-26 NOTE — Telephone Encounter (Signed)
Will pass to RN Navigator for review. 

## 2019-12-26 NOTE — Telephone Encounter (Signed)
Due to department closure will wait to check pt's insurance once we reopen.  

## 2019-12-29 NOTE — Progress Notes (Signed)
[facility] [name]                                       Cardiology Office Note    Date:  01/01/2020   ID:  Jose [name], DOB 1951/11/03, MRN [id number]  PCP:  [name], MD  Cardiologist:  Dr. Johnsie Cancel / Dr. Burt Knack & Dr. Cyndia Bent (TAVR)  CC: TOC s/p TAVR  History of Present Illness:  Jose [name] is a 69 y.o. 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  The patient has been followed for a dilated ascending aorta now for several years. He has a known bicuspid aortic valve and has developed progressive aortic stenosis. His mean gradient has increased from 22 mmHg and 2018 to 45 mmHg in 2020. The peak transaortic velocity is now in excess of 4 m/s and his LVEF remains preserved at 55 to 60%. Cath 11/08/19 showed moderate eccentric proxima/mid LAD stenosis, severe stenosis of a moderate sized first diagonal branch, not suitable for PCI, patent left main, LCx, and RCA with mild nonobstructive plaque.   The patient was evaluated by the multidisciplinary valve team and underwent successful TAVR with a 26 mm Edwards Sapien 3 Ultra THV via the TF approach. Post op echo showed normal LV function, normally functioning TAVR with a mean gradient of 11 mmHg and no PVL. He was discharged on aspirin and plavix.  Today he presents to clinic for follow up. No CP or SOB. No LE edema, orthopnea or PND.  He has had some mild dizziness but no syncope. No blood in stool or urine. No palpitations. Has had a dry cough and some leg aches.  Patient is very worried about being on Plavix given his history of intracranial bleed.   Past Medical History:  Diagnosis Date  . Arthritis    R shoulder, great toes, hands- has gout & has  injections in knees with Dr. Estanislado Pandy   . Bicuspid aortic valve with ascending aorta 4.0 to 4.5 cm in diameter   . Cancer ([place])    skin ca-   . GERD (gastroesophageal reflux disease)   . Headache   . History of hiatal hernia   . Hypertension   . SDH (subdural hematoma) ([place])   . Severe aortic stenosis   . Vertebral artery aneurysm [facility])     Past Surgical History:  Procedure Laterality Date  . BRAIN SURGERY  2016   evacuation of SDH  . CARDIAC CATHETERIZATION    . CARPAL TUNNEL RELEASE Bilateral   . GREEN LIGHT LASER TURP (TRANSURETHRAL RESECTION OF PROSTATE  04/27/2017  . KNEE ARTHROPLASTY    . LEFT HEART CATH AND CORONARY ANGIOGRAPHY N/A 10/08/2019   Procedure: LEFT HEART CATH AND CORONARY ANGIOGRAPHY;  Surgeon: Sherren Mocha, MD;  Location: [facility];  Service: Cardiovascular;  Laterality: N/A;  . QUADRICEPS TENDON REPAIR Bilateral 06/15/2017   Procedure: REPAIR QUADRICEP TENDON;  Surgeon: Meredith Pel, MD;  Location: [place];  Service: Orthopedics;  Laterality: Bilateral;  . SHOULDER SURGERY Right   . TRANSCATHETER AORTIC VALVE REPLACEMENT, TRANSFEMORAL  12/23/2019  . TRANSCATHETER AORTIC VALVE REPLACEMENT, TRANSFEMORAL N/A 12/23/2019  Procedure: TRANSCATHETER AORTIC VALVE REPLACEMENT, TRANSFEMORAL;  Surgeon: Sherren Mocha, MD;  Location: [place];  Service: Open Heart Surgery;  Laterality: N/A;  . VASCULAR SURGERY      Current Medications: Outpatient Medications Prior to Visit  Medication Sig Dispense Refill  . allopurinol (ZYLOPRIM) 300 MG tablet TAKE 1 TABLET BY MOUTH EVERY DAY 90 tablet 0  . aspirin 81 MG chewable tablet Chew 1 tablet (81 mg total) by mouth daily.    Marland Kitchen atorvastatin (LIPITOR) 20 MG tablet Take 1 tablet (20 mg total) by mouth daily at 6 PM. 90 tablet 3  . clopidogrel (PLAVIX) 75 MG tablet Take 1 tablet (75 mg total) by mouth daily with breakfast. 30 tablet 11  . esomeprazole (NEXIUM) 20 MG capsule Take 20 mg by mouth daily at 12 noon.    .  metoprolol succinate (TOPROL-XL) 25 MG 24 hr tablet Take 25 mg by mouth daily.    . Multiple Vitamin (MULTIVITAMIN WITH MINERALS) TABS tablet Take 1 tablet by mouth daily.    . traMADol (ULTRAM) 50 MG tablet Take 1-2 tablets (50-100 mg total) by mouth every 4 (four) hours as needed for moderate pain. 20 tablet 0   No facility-administered medications prior to visit.     Allergies:   Ciprofloxacin and Quinolones   Social History   Socioeconomic History  . Marital status: Divorced    Spouse name: Not on file  . Number of children: Not on file  . Years of education: Not on file  . Highest education level: Not on file  Occupational History  . Not on file  Tobacco Use  . Smoking status: Never Smoker  . Smokeless tobacco: Never Used  Substance and Sexual Activity  . Alcohol use: Yes    Alcohol/week: 8.0 standard drinks    Types: 7 Glasses of wine, 1 Shots of liquor per week  . Drug use: No  . Sexual activity: Not on file  Other Topics Concern  . Not on file  Social History Narrative  . Not on file   Social Determinants of Health   Financial Resource Strain:   . Difficulty of Paying Living Expenses: Not on file  Food Insecurity:   . Worried About Charity fundraiser in the Last Year: Not on file  . Ran Out of Food in the Last Year: Not on file  Transportation Needs:   . Lack of Transportation (Medical): Not on file  . Lack of Transportation (Non-Medical): Not on file  Physical Activity:   . Days of Exercise per Week: Not on file  . Minutes of Exercise per Session: Not on file  Stress:   . Feeling of Stress : Not on file  Social Connections:   . Frequency of Communication with Friends and Family: Not on file  . Frequency of Social Gatherings with Friends and Family: Not on file  . Attends Religious Services: Not on file  . Active Member of Clubs or Organizations: Not on file  . Attends Archivist Meetings: Not on file  . Marital Status: Not on file      Family History:  The patient's family history includes Diabetes in his brother; Heart disease in his father; Parkinson's disease in his brother and mother.     ROS:   Please see the history of present illness.    ROS All other systems reviewed and are negative.   PHYSICAL EXAM:   VS:  BP 122/70   Pulse 64   Ht 6' (1.829  m)   Wt 204 lb (92.5 kg)   SpO2 92%   BMI 27.67 kg/m    GEN: Well nourished, well developed, in no acute distress HEENT: normal Neck: no JVD or masses Cardiac: RRR; no murmurs, rubs, or gallops,no edema  Respiratory:  clear to auscultation bilaterally, normal work of breathing GI: soft, nontender, nondistended, + BS MS: no deformity or atrophy Skin: warm and dry, no rash.  Groin sites healing well without hematoma or ecchymosis Neuro:  Alert and Oriented x 3, Strength and sensation are intact Psych: euthymic mood, full affect   Wt Readings from Last 3 Encounters:  01/01/20 204 lb (92.5 kg)  12/24/19 203 lb 14.4 oz (92.5 kg)  12/18/19 206 lb 1.6 oz (93.5 kg)      Studies/Labs Reviewed:   EKG:  EKG is ordered today.  The ekg ordered today demonstrates sinus HR 68 bpm  Recent Labs: 12/18/2019: ALT 31; B Natriuretic Peptide 42.1 12/24/2019: BUN 14; Creatinine, Ser 1.12; Hemoglobin 13.9; Magnesium 1.6; Platelets 147; Potassium 4.5; Sodium 139   Lipid Panel    Component Value Date/Time   CHOL 169 11/05/2019 1026   TRIG 56 11/05/2019 1026   HDL 76 11/05/2019 1026   CHOLHDL 2.2 11/05/2019 1026   CHOLHDL 3.7 CALC 12/25/2007 1636   VLDL 25 12/25/2007 1636   LDLCALC 82 11/05/2019 1026   LDLDIRECT 164.4 12/25/2007 1636    Additional studies/ records that were reviewed today include:    TAVR OPERATIVE NOTE   Date of Procedure:                12/23/2019  Preoperative Diagnosis:      Severe Aortic Stenosis   Postoperative Diagnosis:    Same   Procedure:        Transcatheter Aortic Valve Replacement - Percutaneous Transfemoral Approach              Edwards Sapien 3 UltraTHV (size 26 mm, model # 9750TFX, serial # [id number])              Co-Surgeons:                        Gaye Pollack, MD and Sherren Mocha, MD  Anesthesiologist:                  Duane Boston, MD  Echocardiographer:              [name], MD  Pre-operative Echo Findings: ? Severe bicuspid aortic stenosis ? Normal left ventricular systolic function  Post-operative Echo Findings: ? No paravalvular leak ? Normal left ventricular systolic function  ______________  12/24/19 ECHO IMPRESSIONS  1. Left ventricular ejection fraction, by visual estimation, is 60 to 65%. The left ventricle has normal function. There is mildly increased left ventricular hypertrophy.  2. Left ventricular diastolic parameters are consistent with Grade I diastolic dysfunction (impaired relaxation).  3. The left ventricle has no regional wall motion abnormalities.  4. Global right ventricle has normal systolic function.The right ventricular size is normal. No increase in right ventricular wall thickness.  5. Left atrial size was normal.  6. Right atrial size was normal.  7. The mitral valve is normal in structure. No evidence of mitral valve regurgitation. No evidence of mitral stenosis.  8. The tricuspid valve is normal in structure.  9. Aortic valve regurgitation is not visualized. No evidence of aortic valve sclerosis or stenosis. 10. The pulmonic valve was normal in structure. Pulmonic  valve regurgitation is not visualized. 11. There is moderate dilatation of the ascending aorta measuring 45 mm. 12. The inferior vena cava is normal in size with greater than 50% respiratory variability, suggesting right atrial pressure of 3 mmHg. 13. S/P TAVR with a 26 mm Edwards-SAPIEN 3 Ultra valve, peak/mean gradients 17/11 mmHg. No paravalvular leak.  Aortic Valve: The aortic valve has been repaired/replaced. Aortic valve regurgitation is not visualized. The aortic valve is structurally  normal, with no evidence of sclerosis or stenosis. Aortic valve mean gradient measures 10.5 mmHg. Aortic valve peak  gradient measures 17.0 mmHg. Aortic valve area, by VTI measures 3.38 cm. 26 Edwards Sapien bioprosthetic, stented aortic valve (TAVR) valve is present in the aortic position.    ASSESSMENT & PLAN:   Severe AS s/p TAVR: Doing well.  ECG with sinus and no HAVB groin sites healing well. SBE prophylaxis discussed; I have RX'd amoxicillin.  Continue aspirin and Plavix for now.  The patient is concerned about being on Plavix and has history of intracranial bleeding.  I will discuss with Dr. Burt Knack if we could consider him for monotherapy with aspirin.  HTN: BP well controlled. Continue Toprol XL 25 mg daily.   TAA: he has a known stable 4.5 cm ascending thoracic aortic aneurysm. Recommend semi-annual imaging followup by CTA or MRA (followed by Dr. Servando Snare)   Medication Adjustments/Labs and Tests Ordered: Current medicines are reviewed at length with the patient today.  Concerns regarding medicines are outlined above.  Medication changes, Labs and Tests ordered today are listed in the Patient Instructions below. Patient Instructions  Medication Instructions:  Your provider discussed the importance of taking an antibiotic prior to all dental visits to prevent damage to the heart valves from infection. You were given a prescription for AMOXIL 2,000mg  to take one hour prior to any dental appointment.   Follow-Up: Please keep your upcoming appointments!    Signed, Angelena Form, PA-C  01/01/2020 [facility] [place], [place], [place]  [address] Phone: [phone]; Fax: [phone]

## 2020-01-01 ENCOUNTER — Other Ambulatory Visit: Payer: Self-pay

## 2020-01-01 ENCOUNTER — Encounter: Payer: Self-pay | Admitting: Physician Assistant

## 2020-01-01 ENCOUNTER — Ambulatory Visit (INDEPENDENT_AMBULATORY_CARE_PROVIDER_SITE_OTHER): Payer: Medicare Other | Admitting: Physician Assistant

## 2020-01-01 VITALS — BP 122/70 | HR 64 | Ht 72.0 in | Wt 204.0 lb

## 2020-01-01 DIAGNOSIS — Z952 Presence of prosthetic heart valve: Secondary | ICD-10-CM | POA: Diagnosis not present

## 2020-01-01 MED ORDER — AMOXICILLIN 500 MG PO TABS: ORAL_TABLET | ORAL | 11 refills | Status: DC

## 2020-01-01 NOTE — Patient Instructions (Addendum)
Medication Instructions:  Your provider discussed the importance of taking an antibiotic prior to all dental visits to prevent damage to the heart valves from infection. You were given a prescription for AMOXIL 2,000mg  to take one hour prior to any dental appointment.   Follow-Up: Please keep your upcoming appointments!

## 2020-01-04 NOTE — Progress Notes (Signed)
Agree - would stop plavix. thanks

## 2020-01-15 ENCOUNTER — Other Ambulatory Visit: Payer: Self-pay

## 2020-01-15 ENCOUNTER — Encounter: Payer: Self-pay | Admitting: Physician Assistant

## 2020-01-15 ENCOUNTER — Ambulatory Visit (INDEPENDENT_AMBULATORY_CARE_PROVIDER_SITE_OTHER): Payer: Medicare Other | Admitting: Physician Assistant

## 2020-01-15 ENCOUNTER — Ambulatory Visit (HOSPITAL_COMMUNITY): Payer: Medicare Other | Attending: Cardiology

## 2020-01-15 VITALS — BP 120/70 | HR 84 | Ht 72.0 in | Wt 208.0 lb

## 2020-01-15 DIAGNOSIS — Z952 Presence of prosthetic heart valve: Secondary | ICD-10-CM | POA: Diagnosis not present

## 2020-01-15 DIAGNOSIS — E785 Hyperlipidemia, unspecified: Secondary | ICD-10-CM

## 2020-01-15 DIAGNOSIS — I719 Aortic aneurysm of unspecified site, without rupture: Secondary | ICD-10-CM | POA: Diagnosis not present

## 2020-01-15 NOTE — Patient Instructions (Addendum)
Medication Instructions:  Your provider recommends that you continue on your current medications as directed. Please refer to the Current Medication list given to you today.    Labwork: You will have labs drawn before your CT in May.  Testing/Procedures: Please schedule your CT at [facility] in May (a couple days prior to your visit with Dr. Johnsie Cancel).  Follow-Up: You have a follow-up with Dr. Johnsie Cancel scheduled 5/21 at 9:00AM.   We will call you to arrange your 1 year TAVR echo and office visit.

## 2020-01-15 NOTE — Progress Notes (Signed)
[facility] [place]                                       Cardiology Office Note    Date:  01/16/2020   ID:  Jose [name], DOB Sep 18, 1951, MRN [id number]  PCP:  [name], MD  Cardiologist:  Dr. Johnsie Cancel / Dr. Burt Knack & Dr. Cyndia Bent (TAVR)  CC: 1 month s/p TAVR  History of Present Illness:  Jose [name] is a 69 y.o. 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  The patient has been followed for a dilated ascending aorta now for several years. He has a known bicuspid aortic valve and has developed progressive aortic stenosis. His mean gradient has increased from 22 mmHg and 2018 to 45 mmHg in 2020. The peak transaortic velocity is now in excess of 4 m/s and his LVEF remains preserved at 55 to 60%. Cath 11/08/19 showed moderate eccentric proxima/mid LAD stenosis, severe stenosis of a moderate sized first diagonal branch, not suitable for PCI, patent left main, LCx, and RCA with mild nonobstructive plaque.   The patient was evaluated by the multidisciplinary valve team and underwent successful TAVR with a 26 mm Edwards Sapien 3 Ultra THV via the TF approach. Post op echo showed normal LV function, normally functioning TAVR with a mean gradient of 11 mmHg and no PVL. He was discharged on aspirin and plavix. Later we discontinued plavix given history of intracranial bleed.   Today he presents to clinic for follow up. No CP or SOB. No LE edema, orthopnea or PND. No dizziness or syncope. No blood in stool or urine. No palpitations. Feels very good ( never had a lot of symptoms to start with). He is very pleased with his care.    Past Medical History:  Diagnosis Date  . Arthritis    R shoulder, great toes, hands- has  gout & has injections in knees with Dr. Estanislado Pandy   . Bicuspid aortic valve with ascending aorta 4.0 to 4.5 cm in diameter   . Cancer ([place])    skin ca-   . GERD (gastroesophageal reflux disease)   . Headache   . History of hiatal hernia   . Hypertension   . SDH (subdural hematoma) ([place])   . Severe aortic stenosis   . Vertebral artery aneurysm [facility])     Past Surgical History:  Procedure Laterality Date  . BRAIN SURGERY  2016   evacuation of SDH  . CARDIAC CATHETERIZATION    . CARPAL TUNNEL RELEASE Bilateral   . GREEN LIGHT LASER TURP (TRANSURETHRAL RESECTION OF PROSTATE  04/27/2017  . KNEE ARTHROPLASTY    . LEFT HEART CATH AND CORONARY ANGIOGRAPHY N/A 10/08/2019   Procedure: LEFT HEART CATH AND CORONARY ANGIOGRAPHY;  Surgeon: Sherren Mocha, MD;  Location: [place] [facility];  Service: Cardiovascular;  Laterality: N/A;  . QUADRICEPS TENDON REPAIR Bilateral 06/15/2017   Procedure: REPAIR QUADRICEP TENDON;  Surgeon: Meredith Pel, MD;  Location: [place];  Service: Orthopedics;  Laterality: Bilateral;  . SHOULDER SURGERY Right   . TRANSCATHETER AORTIC VALVE REPLACEMENT, TRANSFEMORAL  12/23/2019  . TRANSCATHETER AORTIC VALVE REPLACEMENT, TRANSFEMORAL N/A 12/23/2019  Procedure: TRANSCATHETER AORTIC VALVE REPLACEMENT, TRANSFEMORAL;  Surgeon: Sherren Mocha, MD;  Location: [place];  Service: Open Heart Surgery;  Laterality: N/A;  . VASCULAR SURGERY      Current Medications: Outpatient Medications Prior to Visit  Medication Sig Dispense Refill  . allopurinol (ZYLOPRIM) 300 MG tablet TAKE 1 TABLET BY MOUTH EVERY DAY 90 tablet 0  . amoxicillin (AMOXIL) 500 MG tablet Take 4 capsules (2,000 mg) 1 hour prior to all dental visits. 8 tablet 11  . aspirin 81 MG chewable tablet Chew 1 tablet (81 mg total) by mouth daily.    Marland Kitchen atorvastatin (LIPITOR) 20 MG tablet Take 1 tablet (20 mg total) by mouth daily at 6 PM. 90 tablet 3  . esomeprazole (NEXIUM) 20 MG capsule Take 20 mg by mouth daily at 12  noon.    . metoprolol succinate (TOPROL-XL) 25 MG 24 hr tablet Take 25 mg by mouth daily.    . Multiple Vitamin (MULTIVITAMIN WITH MINERALS) TABS tablet Take 1 tablet by mouth daily.    . traMADol (ULTRAM) 50 MG tablet Take 1-2 tablets (50-100 mg total) by mouth every 4 (four) hours as needed for moderate pain. 20 tablet 0  . clopidogrel (PLAVIX) 75 MG tablet Take 1 tablet (75 mg total) by mouth daily with breakfast. (Patient not taking: Reported on 01/15/2020) 30 tablet 11   No facility-administered medications prior to visit.     Allergies:   Ciprofloxacin and Quinolones   Social History   Socioeconomic History  . Marital status: Divorced    Spouse name: Not on file  . Number of children: Not on file  . Years of education: Not on file  . Highest education level: Not on file  Occupational History  . Not on file  Tobacco Use  . Smoking status: Never Smoker  . Smokeless tobacco: Never Used  Substance and Sexual Activity  . Alcohol use: Yes    Alcohol/week: 8.0 standard drinks    Types: 7 Glasses of wine, 1 Shots of liquor per week  . Drug use: No  . Sexual activity: Not on file  Other Topics Concern  . Not on file  Social History Narrative  . Not on file   Social Determinants of Health   Financial Resource Strain:   . Difficulty of Paying Living Expenses: Not on file  Food Insecurity:   . Worried About Charity fundraiser in the Last Year: Not on file  . Ran Out of Food in the Last Year: Not on file  Transportation Needs:   . Lack of Transportation (Medical): Not on file  . Lack of Transportation (Non-Medical): Not on file  Physical Activity:   . Days of Exercise per Week: Not on file  . Minutes of Exercise per Session: Not on file  Stress:   . Feeling of Stress : Not on file  Social Connections:   . Frequency of Communication with Friends and Family: Not on file  . Frequency of Social Gatherings with Friends and Family: Not on file  . Attends Religious Services:  Not on file  . Active Member of Clubs or Organizations: Not on file  . Attends Archivist Meetings: Not on file  . Marital Status: Not on file     Family History:  The patient's family history includes Diabetes in his brother; Heart disease in his father; Parkinson's disease in his brother and mother.     ROS:   Please see the history of present illness.  ROS All other systems reviewed and are negative.   PHYSICAL EXAM:   VS:  BP 120/70   Pulse 84   Ht 6' (1.829 m)   Wt 208 lb (94.3 kg)   SpO2 98%   BMI 28.21 kg/m    GEN: Well nourished, well developed, in no acute distress HEENT: normal Neck: no JVD or masses Cardiac: RRR; no murmurs, rubs, or gallops,no edema  Respiratory:  clear to auscultation bilaterally, normal work of breathing GI: soft, nontender, nondistended, + BS MS: no deformity or atrophy Skin: warm and dry, no rash.  Groin sites healing well without hematoma or ecchymosis Neuro:  Alert and Oriented x 3, Strength and sensation are intact Psych: euthymic mood, full affect   Wt Readings from Last 3 Encounters:  01/15/20 208 lb (94.3 kg)  01/01/20 204 lb (92.5 kg)  12/24/19 203 lb 14.4 oz (92.5 kg)      Studies/Labs Reviewed:   EKG:  EKG is NOT ordered today  Recent Labs: 12/18/2019: ALT 31; B Natriuretic Peptide 42.1 12/24/2019: BUN 14; Creatinine, Ser 1.12; Hemoglobin 13.9; Magnesium 1.6; Platelets 147; Potassium 4.5; Sodium 139   Lipid Panel    Component Value Date/Time   CHOL 169 11/05/2019 1026   TRIG 56 11/05/2019 1026   HDL 76 11/05/2019 1026   CHOLHDL 2.2 11/05/2019 1026   CHOLHDL 3.7 CALC 12/25/2007 1636   VLDL 25 12/25/2007 1636   LDLCALC 82 11/05/2019 1026   LDLDIRECT 164.4 12/25/2007 1636    Additional studies/ records that were reviewed today include:    TAVR OPERATIVE NOTE   Date of Procedure:                12/23/2019  Preoperative Diagnosis:      Severe Aortic Stenosis   Postoperative Diagnosis:    Same    Procedure:        Transcatheter Aortic Valve Replacement - Percutaneous Transfemoral Approach             Edwards Sapien 3 UltraTHV (size 26 mm, model # 9750TFX, serial # [id number])              Co-Surgeons:                        Gaye Pollack, MD and Sherren Mocha, MD  Anesthesiologist:                  Duane Boston, MD  Echocardiographer:              [name], MD  Pre-operative Echo Findings: ? Severe bicuspid aortic stenosis ? Normal left ventricular systolic function  Post-operative Echo Findings: ? No paravalvular leak ? Normal left ventricular systolic function  ______________  12/24/19 ECHO IMPRESSIONS  1. Left ventricular ejection fraction, by visual estimation, is 60 to 65%. The left ventricle has normal function. There is mildly increased left ventricular hypertrophy.  2. Left ventricular diastolic parameters are consistent with Grade I diastolic dysfunction (impaired relaxation).  3. The left ventricle has no regional wall motion abnormalities.  4. Global right ventricle has normal systolic function.The right ventricular size is normal. No increase in right ventricular wall thickness.  5. Left atrial size was normal.  6. Right atrial size was normal.  7. The mitral valve is normal in structure. No evidence of mitral valve regurgitation. No evidence of mitral stenosis.  8. The tricuspid valve is normal in structure.  9. Aortic valve regurgitation is not visualized. No  evidence of aortic valve sclerosis or stenosis. 10. The pulmonic valve was normal in structure. Pulmonic valve regurgitation is not visualized. 11. There is moderate dilatation of the ascending aorta measuring 45 mm. 12. The inferior vena cava is normal in size with greater than 50% respiratory variability, suggesting right atrial pressure of 3 mmHg. 13. S/P TAVR with a 26 mm Edwards-SAPIEN 3 Ultra valve, peak/mean gradients 17/11 mmHg. No paravalvular leak.  Aortic Valve: The aortic  valve has been repaired/replaced. Aortic valve regurgitation is not visualized. The aortic valve is structurally normal, with no evidence of sclerosis or stenosis. Aortic valve mean gradient measures 10.5 mmHg. Aortic valve peak  gradient measures 17.0 mmHg. Aortic valve area, by VTI measures 3.38 cm. 26 Edwards Sapien bioprosthetic, stented aortic valve (TAVR) valve is present in the aortic position.  __________________  Echo 01/15/20 IMPRESSIONS  1. Left ventricular ejection fraction, by visual estimation, is 55 to 60%. The left ventricle has normal function. There is mildly increased left ventricular hypertrophy.  2. Left ventricular diastolic parameters are consistent with Grade I diastolic dysfunction (impaired relaxation).  3. The left ventricle has no regional wall motion abnormalities.  4. Global right ventricle has normal systolic function.The right ventricular size is normal. No increase in right ventricular wall thickness.  5. Left atrial size was mild-moderately dilated.  6. Right atrial size was normal.  7. The mitral valve is normal in structure. Trivial mitral valve regurgitation.  8. The tricuspid valve is normal in structure.  9. Aortic stent-valve (TAVR) prosthesis is well seated, without perivalvular leak.  10. Aortic valve mean gradient measures 13.7 mmHg.  11. Aortic valve peak gradient measures 24.4 mmHg.  12. Aortic valve regurgitation is not visualized.  13. The pulmonic valve was not well visualized. Pulmonic valve regurgitation is not visualized.  14. Aortic dilatation noted.  15. There is mild to moderate dilatation of the ascending aorta measuring  44 mm.  16. No significant change from prior study.  17. Prior images reviewed side by side.  18. When compared to the 12/24/2019 study, the TAVR gradients are very slightly higher. The changes in TAVR dimensionless index and calculated valve area are due to differences in LVOT pulsed wave sampling technique,  rather than a true change in valve hemodynamics.   ASSESSMENT & PLAN:   Severe AS s/p TAVR: echo today shows EF 55%, normally functioning TAVR with a mean gradient of 14 mm hg and no PVL. He has NYHA class I symptoms. SBE prophylaxis discussed; he has amoxicillin. He is on monotherapy with aspirin given history of intracranial bleeding. He will continue on this indefinitely. I will see him back in 1 year with an echo.     HTN: BP well controlled today. Continue Toprol XL 25mg  daily.   TAA: he has a known stable 4.5 cm ascending thoracic aortic aneurysm. Recommend semi-annual imaging followup by CTA or MRA (followed by Dr. Servando Snare). He Is due for a repeat CT angio in May and also a visit with Dr. Johnsie Cancel. I will have them arranged for the same day.    Medication Adjustments/Labs and Tests Ordered: Current medicines are reviewed at length with the patient today.  Concerns regarding medicines are outlined above.  Medication changes, Labs and Tests ordered today are listed in the Patient Instructions below. Patient Instructions  Medication Instructions:  Your provider recommends that you continue on your current medications as directed. Please refer to the Current Medication list given to you today.    Labwork: You  will have labs drawn before your CT in May.  Testing/Procedures: Please schedule your CT at [facility] in May (a couple days prior to your visit with Dr. Johnsie Cancel).  Follow-Up: You have a follow-up with Dr. Johnsie Cancel scheduled 5/21 at 9:00AM.   We will call you to arrange your 1 year TAVR echo and office visit.    Mable Fill, PA-C  01/16/2020 11:39 AM    [facility] [address], [place], [place]  [address] Phone: [phone]; Fax: [phone]

## 2020-01-22 ENCOUNTER — Telehealth (HOSPITAL_COMMUNITY): Payer: Self-pay | Admitting: *Deleted

## 2020-01-22 NOTE — Telephone Encounter (Signed)
Left message for pt requesting call back for scheduling Virtual and Onsite Cardiac Rehab.  Call back number provided. Cherre Huger, BSN Cardiac and Training and development officer

## 2020-01-23 ENCOUNTER — Other Ambulatory Visit: Payer: Self-pay | Admitting: Rheumatology

## 2020-01-23 NOTE — Telephone Encounter (Signed)
Last Visit: 10/07/2019 Next Visit: 04/05/2020 Labs: 09/26/2019 CBC and BMP WNL   Okay to refill per Dr. Estanislado Pandy.

## 2020-02-09 ENCOUNTER — Other Ambulatory Visit: Payer: Medicare Other

## 2020-02-09 ENCOUNTER — Telehealth: Payer: Self-pay | Admitting: Cardiovascular Disease

## 2020-02-09 NOTE — Telephone Encounter (Signed)
New Message  Pt called and wanted to speak with a nurse in regards to lab work he was supposed to do today. Wanted to know if he could do the lab work while he's out of town or if he needs to reschedule it.   Please call to discuss

## 2020-02-09 NOTE — Telephone Encounter (Signed)
Left message for patient to call back. Cancelled patient's lab appointment for today. Will reschedule when he calls back.

## 2020-02-17 ENCOUNTER — Other Ambulatory Visit: Payer: Medicare Other

## 2020-02-17 ENCOUNTER — Encounter (HOSPITAL_COMMUNITY): Payer: Self-pay | Admitting: *Deleted

## 2020-02-17 ENCOUNTER — Other Ambulatory Visit: Payer: Self-pay

## 2020-02-17 DIAGNOSIS — Z952 Presence of prosthetic heart valve: Secondary | ICD-10-CM

## 2020-02-17 NOTE — Progress Notes (Signed)
No response from this pt. Please Contact Letter mailed on 01/22/20. Will close this referral for Cardiac Rehab. Cherre Huger, BSN Cardiac and Training and development officer

## 2020-02-18 LAB — BASIC METABOLIC PANEL
BUN/Creatinine Ratio: 12 (ref 10–24)
BUN: 13 mg/dL (ref 8–27)
CO2: 24 mmol/L (ref 20–29)
Calcium: 9.7 mg/dL (ref 8.6–10.2)
Chloride: 100 mmol/L (ref 96–106)
Creatinine, Ser: 1.11 mg/dL (ref 0.76–1.27)
GFR calc Af Amer: 77 mL/min/{1.73_m2} (ref >59)
GFR calc non Af Amer: 67 mL/min/{1.73_m2} (ref >59)
Glucose: 88 mg/dL (ref 65–99)
Potassium: 4.6 mmol/L (ref 3.5–5.2)
Sodium: 141 mmol/L (ref 134–144)

## 2020-02-19 ENCOUNTER — Ambulatory Visit: Payer: Medicare Other | Attending: Internal Medicine

## 2020-02-19 DIAGNOSIS — Z23 Encounter for immunization: Secondary | ICD-10-CM | POA: Insufficient documentation

## 2020-02-19 NOTE — Progress Notes (Signed)
   Covid-19 Vaccination Clinic  Name:  Jose [name]    MRN: [id number] DOB: Nov 28, 1951  02/19/2020  Mr. [name] was observed post Covid-19 immunization for 15 minutes without incident. He was provided with Vaccine Information Sheet and instruction to access the V-Safe system.   Mr. [name] was instructed to call 911 with any severe reactions post vaccine: Marland Kitchen Difficulty breathing  . Swelling of face and throat  . A fast heartbeat  . A bad rash all over body  . Dizziness and weakness

## 2020-03-04 ENCOUNTER — Telehealth: Payer: Self-pay | Admitting: Radiology

## 2020-03-04 NOTE — Telephone Encounter (Signed)
Patient states that he filled out a record release form at the front desk earlier this week and he is asking about the status of this.

## 2020-03-05 NOTE — Telephone Encounter (Signed)
Ic,advised records were faxed 3/18.

## 2020-03-09 ENCOUNTER — Telehealth: Payer: Self-pay | Admitting: Physical Medicine and Rehabilitation

## 2020-03-09 NOTE — Telephone Encounter (Signed)
Patient called stated Dr. Pia Mau office did not receive records per I faxed on 3/18. I advised pt I will refax and did so. [phone] per release form,confirmation received from fax on 3/18, 49pages)

## 2020-03-15 ENCOUNTER — Telehealth: Payer: Self-pay | Admitting: Physical Medicine and Rehabilitation

## 2020-03-15 NOTE — Telephone Encounter (Signed)
Received vm from [facility] w/ Dr. Rosealee Albee office, stating she has not received records, that patient has called her numerous times that we have faxed records, but she does not have them, to fax to [phone]. Stated fax and phone are same number. I faxed records to this number. Records I faxed twice previously were faxed to the number patient provided on the release form he completed.

## 2020-03-17 ENCOUNTER — Encounter: Payer: Self-pay | Admitting: Rheumatology

## 2020-03-17 ENCOUNTER — Ambulatory Visit: Payer: Medicare Other | Attending: Internal Medicine

## 2020-03-17 DIAGNOSIS — Z23 Encounter for immunization: Secondary | ICD-10-CM

## 2020-03-17 NOTE — Progress Notes (Signed)
   Covid-19 Vaccination Clinic  Name:  Jose [name]    MRN: [id number] DOB: April 04, 1951  03/17/2020  Mr. Szalkowski was observed post Covid-19 immunization for 15 minutes without incident. He was provided with Vaccine Information Sheet and instruction to access the V-Safe system.   Mr. Pacek was instructed to call 911 with any severe reactions post vaccine: Marland Kitchen Difficulty breathing  . Swelling of face and throat  . A fast heartbeat  . A bad rash all over body  . Dizziness and weakness   Immunizations Administered    Name Date Dose VIS Date Route   Pfizer COVID-19 Vaccine 03/17/2020  2:34 PM 0.3 mL 11/28/2019 Intramuscular   Manufacturer: [place]   Lot: [id number]   [place]: [id number]

## 2020-03-18 ENCOUNTER — Encounter: Payer: Self-pay | Admitting: Rheumatology

## 2020-03-26 NOTE — Progress Notes (Deleted)
Office Visit Note  Patient: Jose [name]             Date of Birth: [birth year]           MRN: [id number]             PCP: [name], MD Referring: [name], MD Visit Date: 04/05/2020 Occupation: @GUAROCC @  Subjective:  No chief complaint on file.   History of Present Illness: Jose [name] is a 69 y.o. male ***   Activities of Daily Living:  Patient reports morning stiffness for *** {minute/hour:19697}.   Patient {ACTIONS;DENIES/REPORTS:21021675::"Denies"} nocturnal pain.  Difficulty dressing/grooming: {ACTIONS;DENIES/REPORTS:21021675::"Denies"} Difficulty climbing stairs: {ACTIONS;DENIES/REPORTS:21021675::"Denies"} Difficulty getting out of chair: {ACTIONS;DENIES/REPORTS:21021675::"Denies"} Difficulty using hands for taps, buttons, cutlery, and/or writing: {ACTIONS;DENIES/REPORTS:21021675::"Denies"}  No Rheumatology ROS completed.   PMFS History:  Patient Active Problem List   Diagnosis Date Noted  . Hypertension   . Severe aortic stenosis 10/08/2019  . Candidiasis   . Abnormality of gait   . Hypoalbuminemia due to protein-calorie malnutrition ([place])   . History of gout   . Sleep disturbance   . Constipation   . Rupture of quadriceps tendon, right, sequela 06/19/2017  . Quadriceps tendon rupture, left, sequela 06/19/2017  . Acute blood loss anemia 06/19/2017  . Fall   . History of subdural hematoma   . Post-operative pain   . Recurrent falls   . Quadriceps tendon rupture 06/15/2017  . Primary osteoarthritis of both knees 02/28/2017  . Primary osteoarthritis of both hands 02/28/2017  . Uricacidemia 02/28/2017  . Pain in joint of left knee 02/07/2017  . Idiopathic chronic gout, unspecified site, without tophus (tophi) 11/29/2016  . HEMATURIA UNSPECIFIED 01/25/2009  . ABDOMINAL PAIN 01/25/2009  . XERODERMA 03/10/2008  . UNS ADVRS EFF UNS RX MEDICINAL&BIOLOGICAL SBSTNC 03/10/2008  . ADJUSTMENT REACTION WITH PHYSICAL SYMPTOMS 02/14/2008  . MUSCLE  SPASM 02/14/2008  . ACUTE PROSTATITIS 12/16/2007  . BREAST LUMP OR MASS, RIGHT 07/12/2007  . H/O: RCT (rotator cuff tear) 01/13/2007  . GOUT 01/08/2007    Past Medical History:  Diagnosis Date  . Arthritis    R shoulder, great toes, hands- has gout & has injections in knees with Dr. Estanislado Pandy   . Bicuspid aortic valve with ascending aorta 4.0 to 4.5 cm in diameter   . Cancer ([place])    skin ca-   . GERD (gastroesophageal reflux disease)   . Headache   . History of hiatal hernia   . Hypertension   . SDH (subdural hematoma) ([place])   . Severe aortic stenosis   . Vertebral artery aneurysm ([facility])     Family History  Problem Relation Age of Onset  . Parkinson's disease Mother   . Heart disease Father   . Parkinson's disease Brother   . Diabetes Brother    Past Surgical History:  Procedure Laterality Date  . BRAIN SURGERY  2016   evacuation of SDH  . CARDIAC CATHETERIZATION    . CARPAL TUNNEL RELEASE Bilateral   . GREEN LIGHT LASER TURP (TRANSURETHRAL RESECTION OF PROSTATE  04/27/2017  . KNEE ARTHROPLASTY    . LEFT HEART CATH AND CORONARY ANGIOGRAPHY N/A 10/08/2019   Procedure: LEFT HEART CATH AND CORONARY ANGIOGRAPHY;  Surgeon: Sherren Mocha, MD;  Location: [place] [facility];  Service: Cardiovascular;  Laterality: N/A;  . QUADRICEPS TENDON REPAIR Bilateral 06/15/2017   Procedure: REPAIR QUADRICEP TENDON;  Surgeon: Meredith Pel, MD;  Location: [place];  Service: Orthopedics;  Laterality: Bilateral;  . SHOULDER SURGERY Right   .  TRANSCATHETER AORTIC VALVE REPLACEMENT, TRANSFEMORAL  12/23/2019  . TRANSCATHETER AORTIC VALVE REPLACEMENT, TRANSFEMORAL N/A 12/23/2019   Procedure: TRANSCATHETER AORTIC VALVE REPLACEMENT, TRANSFEMORAL;  Surgeon: Sherren Mocha, MD;  Location: [facility];  Service: Open Heart Surgery;  Laterality: N/A;  . VASCULAR SURGERY     Social History   Social History Narrative  . Not on file   Immunization History  Administered Date(s) Administered  . PFIZER  SARS-COV-2 Vaccination 02/19/2020, 03/17/2020     Objective: Vital Signs: There were no vitals taken for this visit.   Physical Exam   Musculoskeletal Exam: ***  CDAI Exam: CDAI Score: -- Patient Global: --; Provider Global: -- Swollen: --; Tender: -- Joint Exam 04/05/2020   No joint exam has been documented for this visit   There is currently no information documented on the homunculus. Go to the Rheumatology activity and complete the homunculus joint exam.  Investigation: No additional findings.  Imaging: No results found.  Recent Labs: Lab Results  Component Value Date   WBC 11.8 (H) 12/24/2019   HGB 13.9 12/24/2019   PLT 147 (L) 12/24/2019   NA 141 02/17/2020   K 4.6 02/17/2020   CL 100 02/17/2020   CO2 24 02/17/2020   GLUCOSE 88 02/17/2020   BUN 13 02/17/2020   CREATININE 1.11 02/17/2020   BILITOT 0.5 12/18/2019   ALKPHOS 65 12/18/2019   AST 27 12/18/2019   ALT 31 12/18/2019   PROT 6.8 12/18/2019   ALBUMIN 3.9 12/18/2019   CALCIUM 9.7 02/17/2020   GFRAA 77 02/17/2020    Speciality Comments: No specialty comments available.  Procedures:  No procedures performed Allergies: Ciprofloxacin and Quinolones   Assessment / Plan:     Visit Diagnoses: No diagnosis found.  Orders: No orders of the defined types were placed in this encounter.  No orders of the defined types were placed in this encounter.   Face-to-face time spent with patient was *** minutes. Greater than 50% of time was spent in counseling and coordination of care.  Follow-Up Instructions: No follow-ups on file.   Ofilia Neas, PA-C  Note - This record has been created using Dragon software.  Chart creation errors have been sought, but may not always  have been located. Such creation errors do not reflect on  the standard of medical care.

## 2020-04-05 ENCOUNTER — Ambulatory Visit: Payer: Medicare Other | Admitting: Physician Assistant

## 2020-04-21 DIAGNOSIS — I712 Thoracic aortic aneurysm, without rupture: Secondary | ICD-10-CM

## 2020-04-21 DIAGNOSIS — I7121 Aneurysm of the ascending aorta, without rupture: Secondary | ICD-10-CM

## 2020-04-21 DIAGNOSIS — Z952 Presence of prosthetic heart valve: Secondary | ICD-10-CM

## 2020-04-23 ENCOUNTER — Other Ambulatory Visit: Payer: Self-pay | Admitting: Rheumatology

## 2020-04-23 ENCOUNTER — Other Ambulatory Visit: Payer: Self-pay

## 2020-04-23 DIAGNOSIS — I350 Nonrheumatic aortic (valve) stenosis: Secondary | ICD-10-CM

## 2020-04-23 NOTE — Telephone Encounter (Signed)
Ok to refill allopurinol

## 2020-04-23 NOTE — Telephone Encounter (Signed)
Last Visit:10/07/2019 Next Visit: 05/05/2020 Labs: 12/24/2019 CBC: WBC 11.8, Platelet 147, 02/17/2020 BMP WNL  Current Dose per office note on 10/07/2019: Allopurinol 300 mg po daily   Okay to refill Allopurinol?

## 2020-04-23 NOTE — Telephone Encounter (Signed)
Left message to call back and schedule lab

## 2020-04-27 NOTE — Progress Notes (Signed)
Office Visit Note  Patient: Jose [name]             Date of Birth: Sep 16, 1951           MRN: [id number]             PCP: [name], MD Referring: [name], MD Visit Date: 05/05/2020 Occupation: @GUAROCC @  Subjective:  Low back pain   History of Present Illness: Jose [name] is a 69 y.o. male with history of gout.  He is taking allopurinol 300 mg 1 tablet by mouth daily and colchicine 0.6 mg 1 tablet by mouth daily as needed for management of gout.  He has not had any recent gout flares.  He has not needed to take colchicine recently. On 07/02/2019 he had a revision of the left quadricep reconstruction performed by Dr. Elisabeth Most. He was in a cast for 8 weeks.  He is going to physical therapy twice a week.  He has not had any complications and his discomfort has been tolerable.  He has been riding a stationary bike 3 times a week.  He has noticed more stability since having reconstructive surgery.  He states his right knee is doing well.   He states that his trigger fingers have resolved since having cortisone injections.  He denies any joint pain or joint swelling in his hands or feet at this time.  He has been experiencing severe pain in his lower back.  He had an injection about 3 weeks ago which has brought his pain level down from a 9 out of 10 to a 6 out of 10.  He is following up with the neurosurgeon tomorrow. Patient reports that on 12/23/2019 he had a transcatheter aortic valve replacement performed by Dr. Burt Knack.  He has not had any complications.  He continues to follow up with Dr. Johnsie Cancel.  He has a CT angio chest scheduled for today.    Activities of Daily Living:  Patient reports morning stiffness for 15 minutes.   Patient Reports nocturnal pain.  Difficulty dressing/grooming: Denies Difficulty climbing stairs: Denies Difficulty getting out of chair: Denies Difficulty using hands for taps, buttons, cutlery, and/or writing: Denies  Review of Systems    Constitutional: Negative for fatigue and night sweats.  HENT: Negative for mouth sores, mouth dryness and nose dryness.   Eyes: Negative for redness and dryness.  Respiratory: Negative for cough, hemoptysis, shortness of breath and difficulty breathing.   Cardiovascular: Negative for chest pain, palpitations, hypertension, irregular heartbeat and swelling in legs/feet.  Gastrointestinal: Negative for blood in stool, constipation and diarrhea.  Endocrine: Negative for excessive thirst and increased urination.  Genitourinary: Negative for difficulty urinating and painful urination.  Musculoskeletal: Positive for arthralgias and joint pain. Negative for joint swelling, myalgias, muscle weakness, morning stiffness, muscle tenderness and myalgias.  Skin: Negative for color change, rash, hair loss, nodules/bumps, skin tightness, ulcers and sensitivity to sunlight.  Allergic/Immunologic: Negative for susceptible to infections.  Neurological: Negative for dizziness, fainting, memory loss, night sweats and weakness.  Hematological: Negative for bruising/bleeding tendency and swollen glands.  Psychiatric/Behavioral: Positive for sleep disturbance. Negative for depressed mood. The patient is not nervous/anxious.     PMFS History:  Patient Active Problem List   Diagnosis Date Noted  . Hypertension   . Severe aortic stenosis 10/08/2019  . Candidiasis   . Abnormality of gait   . Hypoalbuminemia due to protein-calorie malnutrition ([place])   . History of gout   . Sleep disturbance   .  Constipation   . Rupture of quadriceps tendon, right, sequela 06/19/2017  . Quadriceps tendon rupture, left, sequela 06/19/2017  . Acute blood loss anemia 06/19/2017  . Fall   . History of subdural hematoma   . Post-operative pain   . Recurrent falls   . Quadriceps tendon rupture 06/15/2017  . Primary osteoarthritis of both knees 02/28/2017  . Primary osteoarthritis of both hands 02/28/2017  . Uricacidemia  02/28/2017  . Pain in joint of left knee 02/07/2017  . Idiopathic chronic gout, unspecified site, without tophus (tophi) 11/29/2016  . HEMATURIA UNSPECIFIED 01/25/2009  . ABDOMINAL PAIN 01/25/2009  . XERODERMA 03/10/2008  . UNS ADVRS EFF UNS RX MEDICINAL&BIOLOGICAL SBSTNC 03/10/2008  . ADJUSTMENT REACTION WITH PHYSICAL SYMPTOMS 02/14/2008  . MUSCLE SPASM 02/14/2008  . ACUTE PROSTATITIS 12/16/2007  . BREAST LUMP OR MASS, RIGHT 07/12/2007  . H/O: RCT (rotator cuff tear) 01/13/2007  . GOUT 01/08/2007    Past Medical History:  Diagnosis Date  . Arthritis    R shoulder, great toes, hands- has gout & has injections in knees with Dr. Estanislado Pandy   . Bicuspid aortic valve with ascending aorta 4.0 to 4.5 cm in diameter   . Cancer ([place])    skin ca-   . GERD (gastroesophageal reflux disease)   . Headache   . History of hiatal hernia   . Hypertension   . SDH (subdural hematoma) ([place])   . Severe aortic stenosis   . Vertebral artery aneurysm ([facility])     Family History  Problem Relation Age of Onset  . Parkinson's disease Mother   . Heart disease Father   . Parkinson's disease Brother   . Diabetes Brother    Past Surgical History:  Procedure Laterality Date  . BRAIN SURGERY  2016   evacuation of SDH  . CARDIAC CATHETERIZATION    . CARPAL TUNNEL RELEASE Bilateral   . GREEN LIGHT LASER TURP (TRANSURETHRAL RESECTION OF PROSTATE  04/27/2017  . KNEE ARTHROPLASTY    . LEFT HEART CATH AND CORONARY ANGIOGRAPHY N/A 10/08/2019   Procedure: LEFT HEART CATH AND CORONARY ANGIOGRAPHY;  Surgeon: Sherren Mocha, MD;  Location: [facility];  Service: Cardiovascular;  Laterality: N/A;  . QUADRICEPS TENDON REPAIR Bilateral 06/15/2017   Procedure: REPAIR QUADRICEP TENDON;  Surgeon: Meredith Pel, MD;  Location: [facility];  Service: Orthopedics;  Laterality: Bilateral;  . SHOULDER SURGERY Right   . TRANSCATHETER AORTIC VALVE REPLACEMENT, TRANSFEMORAL  12/23/2019  . TRANSCATHETER AORTIC VALVE  REPLACEMENT, TRANSFEMORAL N/A 12/23/2019   Procedure: TRANSCATHETER AORTIC VALVE REPLACEMENT, TRANSFEMORAL;  Surgeon: Sherren Mocha, MD;  Location: [facility];  Service: Open Heart Surgery;  Laterality: N/A;  . VASCULAR SURGERY     Social History   Social History Narrative  . Not on file   Immunization History  Administered Date(s) Administered  . PFIZER SARS-COV-2 Vaccination 02/19/2020, 03/17/2020     Objective: Vital Signs: BP 123/84 (BP Location: Left Arm, Patient Position: Sitting, Cuff Size: Normal)   Pulse 82   Resp 16   Ht 6' (1.829 m)   Wt 206 lb (93.4 kg)   BMI 27.94 kg/m    Physical Exam Vitals and nursing note reviewed.  Constitutional:      Appearance: He is well-developed.  HENT:     Head: Normocephalic and atraumatic.  Eyes:     Conjunctiva/sclera: Conjunctivae normal.     Pupils: Pupils are equal, round, and reactive to light.  Pulmonary:     Effort: Pulmonary effort is normal.  Abdominal:  General: Bowel sounds are normal.     Palpations: Abdomen is soft.  Musculoskeletal:     Cervical back: Normal range of motion and neck supple.  Skin:    General: Skin is warm and dry.     Capillary Refill: Capillary refill takes less than 2 seconds.  Neurological:     Mental Status: He is alert and oriented to person, place, and time.  Psychiatric:        Behavior: Behavior normal.      Musculoskeletal Exam: C-spine limited range of motion with lateral rotation.  Painful and limited range of motion lumbar spine.  Right shoulder limited abduction to about 120 degrees.  Left shoulder abduction to about 90 degrees.  Wrist joints, MCPs, PIPs, DIPs have good range of motion with no synovitis.  He has PIP and DIP thickening consistent with osteoarthritis of both hands.  Right knee joint has good range of motion with no warmth or effusion.  He has slightly limited extension of the left knee but no warmth or effusion was noted.  Ankle joints have good range of motion with no  tenderness or inflammation.  CDAI Exam: CDAI Score: -- Patient Global: --; Provider Global: -- Swollen: --; Tender: -- Joint Exam 05/05/2020   No joint exam has been documented for this visit   There is currently no information documented on the homunculus. Go to the Rheumatology activity and complete the homunculus joint exam.  Investigation: No additional findings.  Imaging: No results found.  Recent Labs: Lab Results  Component Value Date   WBC 11.8 (H) 12/24/2019   HGB 13.9 12/24/2019   PLT 147 (L) 12/24/2019   NA 144 04/29/2020   K 5.2 04/29/2020   CL 105 04/29/2020   CO2 27 04/29/2020   GLUCOSE 94 04/29/2020   BUN 14 04/29/2020   CREATININE 0.96 04/29/2020   BILITOT 0.5 12/18/2019   ALKPHOS 65 12/18/2019   AST 27 12/18/2019   ALT 31 12/18/2019   PROT 6.8 12/18/2019   ALBUMIN 3.9 12/18/2019   CALCIUM 9.4 04/29/2020   GFRAA 92 04/29/2020    Speciality Comments: No specialty comments available.  Procedures:  No procedures performed Allergies: Ciprofloxacin and Quinolones   Assessment / Plan:     Visit Diagnoses: Idiopathic chronic gout of multiple sites without tophus - He has not had any recent gout flares.  He is clinically doing well on allopurinol 300 mg 1 tablet by mouth daily colchicine 0.6 mg 1 tablet by mouth daily as needed during flares.  He has not needed to take colchicine recently.  His uric acid level was 4.0 on 11/03/2019.  We will recheck uric acid level today.  He will continue taking allopurinol as prescribed.  A refill of allopurinol was sent to the pharmacy today.  He does not need a refill of colchicine at this time.  He was advised to notify us if he develops signs or symptoms of a gout flare.  He will follow-up in the office in 6 months. - Plan: Uric acid  Medication monitoring encounter -CBC and LFTs will be checked today to monitor for drug toxicity.  Plan: ALT, AST, CBC with Differential/Platelet  Primary osteoarthritis of both hands:  He has PIP and DIP thickening consistent with osteoarthritis of both hands.  No tenderness or synovitis was noted.  He has complete fist formation bilaterally.  Joint protection and muscle strengthening were discussed.  Primary osteoarthritis of both knees: He has good range of motion of the right knee  joint on exam.  No warmth or effusion was noted.  Slightly limited extension of the left knee but no warmth or effusion was noted.  He is currently going to physical therapy twice a week status post revision of left quadricep reconstruction in July 2020.    Primary osteoarthritis of both feet: He is not having any joint pain or inflammation in his feet at this time.  He has discomfort in both feet due to underlying neuropathy.  Neuropathy: He experiences discomfort in both feet due to underlying neuropathy.  Trigger little finger of right hand - injected 10/07/2019.  Resolved.   Trigger finger, right ring finger - injected 10/07/2019.  Resolved.  Rupture of quadriceps tendon, right, sequela: Doing well.  He has good range of motion with no discomfort.  No warmth or effusion was noted.  Quadriceps tendon rupture, left, sequela - He had a left revision quadriceps tendon reconstruction on 07/02/19 performed by Dr. Elisabeth Most.  He has not had any complications and continues to notice improvement in stability and discomfort.  He is currently going to physical therapy twice a week and rides a stationary bike 3 times a week.  H/O aortic valve replacement: Performed on 12/23/2019 by Dr. Burt Knack.  He has not had any complications.  He has a CT chest angiography scheduled for today.  He continues to follow-up with Dr. Johnsie Cancel.  Other medical conditions are listed as follows:    [place]  Subdural hematoma ([place])  Orders: Orders Placed This Encounter  Procedures  . ALT  . AST  . CBC with Differential/Platelet  . Uric acid   Meds ordered this encounter  Medications  . allopurinol (ZYLOPRIM) 300 MG tablet     Sig: Take 1 tablet (300 mg total) by mouth daily.    Dispense:  90 tablet    Refill:  0     Follow-Up Instructions: Return in about 6 months (around 11/05/2020) for Gout, Osteoarthritis.   Ofilia Neas, PA-C  Note - This record has been created using Dragon software.  Chart creation errors have been sought, but may not always  have been located. Such creation errors do not reflect on  the standard of medical care.

## 2020-04-28 ENCOUNTER — Telehealth: Payer: Self-pay | Admitting: Cardiovascular Disease

## 2020-04-28 ENCOUNTER — Other Ambulatory Visit: Payer: Self-pay

## 2020-04-28 DIAGNOSIS — I350 Nonrheumatic aortic (valve) stenosis: Secondary | ICD-10-CM

## 2020-04-28 NOTE — Telephone Encounter (Signed)
New message  Patient states that he lives in [place] and wants to get his labs drawn at the [facility] near him vs driving to the office. Patient would like orders for labs to be faxed to [facility] in [place] head at [phone]. Please give a call to confirm that it has been done.

## 2020-04-28 NOTE — Progress Notes (Signed)
Cardiology Office Note    Date:  05/07/2020   ID:  Jose [name], DOB [birth year], MRN [id number]  PCP:  [name], MD  Cardiologist:  Dr. Johnsie Cancel / Dr. Burt Knack & Dr. Cyndia Bent (TAVR)  Post TAVR   History of Present Illness:  Jose [name] is a 69 y.o. 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  The patient has been followed for a dilated ascending aorta now for several years. He has a known bicuspid aortic valve and has developed progressive aortic stenosis. Cath 11/08/19 showed moderate eccentric proxima/mid LAD stenosis, severe stenosis of a moderate sized first diagonal branch, not suitable for PCI, patent left main, LCx, and RCA with mild nonobstructive plaque.   Underwent successful TAVR  01/12/20 with a 26 mm Edwards Sapien 3 Ultra THV via the TF approach. Post op echo 01/15/20  showed normal LV function, normally functioning TAVR with a mean gradient of 13.7 mmHg peak 24.4 mmHg DVI 0.36 and AVA 1/63 cm2  and no PVL. He was discharged on aspirin and plavix. Later we discontinued plavix given history of intracranial bleed.   Doing well Spending time in [facility]   Knee rehab going well. Has virtual visit with [name] next week    Past Medical History:  Diagnosis Date  . Arthritis    R shoulder, great toes, hands- has gout & has injections in knees with Dr. Estanislado Pandy   . Bicuspid aortic valve with ascending aorta 4.0 to 4.5 cm in diameter   . Cancer ([place])    skin ca-   . GERD (gastroesophageal reflux disease)   . Headache   . History of hiatal hernia   . Hypertension   . SDH (subdural hematoma) ([place])   . Severe aortic stenosis   . Vertebral artery aneurysm [facility])     Past Surgical History:  Procedure Laterality Date  . BRAIN  SURGERY  2016   evacuation of SDH  . CARDIAC CATHETERIZATION    . CARPAL TUNNEL RELEASE Bilateral   . GREEN LIGHT LASER TURP (TRANSURETHRAL RESECTION OF PROSTATE  04/27/2017  . KNEE ARTHROPLASTY    . LEFT HEART CATH AND CORONARY ANGIOGRAPHY N/A 10/08/2019   Procedure: LEFT HEART CATH AND CORONARY ANGIOGRAPHY;  Surgeon: Sherren Mocha, MD;  Location: [place] [facility];  Service: Cardiovascular;  Laterality: N/A;  . QUADRICEPS TENDON REPAIR Bilateral 06/15/2017   Procedure: REPAIR QUADRICEP TENDON;  Surgeon: Meredith Pel, MD;  Location: [place];  Service: Orthopedics;  Laterality: Bilateral;  . SHOULDER SURGERY Right   . TRANSCATHETER AORTIC VALVE REPLACEMENT, TRANSFEMORAL  12/23/2019  . TRANSCATHETER AORTIC VALVE REPLACEMENT, TRANSFEMORAL N/A 12/23/2019   Procedure: TRANSCATHETER AORTIC VALVE REPLACEMENT, TRANSFEMORAL;  Surgeon: Sherren Mocha, MD;  Location: [place];  Service: Open Heart Surgery;  Laterality: N/A;  . VASCULAR SURGERY      Current Medications: Outpatient Medications Prior to Visit  Medication Sig Dispense Refill  . allopurinol (ZYLOPRIM) 300 MG tablet Take 1 tablet (300 mg total) by mouth daily. 90 tablet 0  . amoxicillin (AMOXIL) 500 MG tablet Take 4 capsules (2,000 mg) 1 hour prior to all dental visits. 8 tablet 11  . aspirin 81 MG chewable tablet Chew 1 tablet (81 mg total) by mouth daily.    Marland Kitchen atorvastatin (  LIPITOR) 20 MG tablet Take 1 tablet (20 mg total) by mouth daily at 6 PM. 90 tablet 3  . cephALEXin (KEFLEX) 500 MG capsule     . clopidogrel (PLAVIX) 75 MG tablet     . colchicine 0.6 MG tablet colchicine 0.6 mg tablet  TAKE 1 TABLET (0.6 MG TOTAL) BY MOUTH DAILY AS NEEDED (FOR GOUT FLAREUPS).    Marland Kitchen diclofenac Sodium (VOLTAREN) 1 % GEL Voltaren 1 % topical gel    . esomeprazole (NEXIUM) 20 MG capsule Take 20 mg by mouth daily at 12 noon.    . metoprolol succinate (TOPROL-XL) 25 MG 24 hr tablet Take 25 mg by mouth daily.    . Multiple Vitamin (MULTIVITAMIN WITH  MINERALS) TABS tablet Take 1 tablet by mouth daily.    . traMADol (ULTRAM) 50 MG tablet Take 1-2 tablets (50-100 mg total) by mouth every 4 (four) hours as needed for moderate pain. 20 tablet 0   No facility-administered medications prior to visit.     Allergies:   Ciprofloxacin and Quinolones   Social History   Socioeconomic History  . Marital status: Divorced    Spouse name: Not on file  . Number of children: Not on file  . Years of education: Not on file  . Highest education level: Not on file  Occupational History  . Not on file  Tobacco Use  . Smoking status: Never Smoker  . Smokeless tobacco: Never Used  Substance and Sexual Activity  . Alcohol use: Yes    Alcohol/week: 8.0 standard drinks    Types: 7 Glasses of wine, 1 Shots of liquor per week  . Drug use: No  . Sexual activity: Not on file  Other Topics Concern  . Not on file  Social History Narrative  . Not on file   Social Determinants of Health   Financial Resource Strain:   . Difficulty of Paying Living Expenses:   Food Insecurity:   . Worried About Charity fundraiser in the Last Year:   . Arboriculturist in the Last Year:   Transportation Needs:   . Film/video editor (Medical):   Marland Kitchen Lack of Transportation (Non-Medical):   Physical Activity:   . Days of Exercise per Week:   . Minutes of Exercise per Session:   Stress:   . Feeling of Stress :   Social Connections:   . Frequency of Communication with Friends and Family:   . Frequency of Social Gatherings with Friends and Family:   . Attends Religious Services:   . Active Member of Clubs or Organizations:   . Attends Archivist Meetings:   Marland Kitchen Marital Status:      Family History:  The patient's family history includes Diabetes in his brother; Heart disease in his father; Parkinson's disease in his brother and mother.     ROS:   Please see the history of present illness.    ROS All other systems reviewed and are negative.   PHYSICAL  EXAM:   VS:  BP 126/66   Pulse 85   Ht 6' (1.829 m)   Wt 206 lb (93.4 kg)   SpO2 99%   BMI 27.94 kg/m    GEN: Well nourished, well developed, in no acute distress HEENT: normal Neck: no JVD or masses Cardiac: RRR; no murmurs, rubs, or gallops,no edema  Respiratory:  clear to auscultation bilaterally, normal work of breathing GI: soft, nontender, nondistended, + BS MS: no deformity or atrophy Skin: warm and  dry, no rash.  Groin sites healing well without hematoma or ecchymosis Neuro:  Alert and Oriented x 3, Strength and sensation are intact Psych: euthymic mood, full affect   Wt Readings from Last 3 Encounters:  05/07/20 206 lb (93.4 kg)  05/05/20 206 lb (93.4 kg)  01/15/20 208 lb (94.3 kg)      Studies/Labs Reviewed:   EKG:   SR rate 64 LAD lateral T wave changes 01/01/20   Recent Labs: 12/18/2019: B Natriuretic Peptide 42.1 12/24/2019: Magnesium 1.6 04/29/2020: BUN 14; Creatinine, Ser 0.96; Potassium 5.2; Sodium 144 05/05/2020: ALT 27; Hemoglobin 14.4; Platelets 216   Lipid Panel    Component Value Date/Time   CHOL 169 11/05/2019 1026   TRIG 56 11/05/2019 1026   HDL 76 11/05/2019 1026   CHOLHDL 2.2 11/05/2019 1026   CHOLHDL 3.7 CALC 12/25/2007 1636   VLDL 25 12/25/2007 1636   LDLCALC 82 11/05/2019 1026   LDLDIRECT 164.4 12/25/2007 1636    Additional studies/ records that were reviewed today include:    TAVR OPERATIVE NOTE   Date of Procedure:                12/23/2019  Preoperative Diagnosis:      Severe Aortic Stenosis   Postoperative Diagnosis:    Same   Procedure:        Transcatheter Aortic Valve Replacement - Percutaneous Transfemoral Approach             Edwards Sapien 3 UltraTHV (size 26 mm, model # 9750TFX, serial # [id number])              Co-Surgeons:                        Gaye Pollack, MD and Sherren Mocha, MD  Anesthesiologist:                  Duane Boston, MD  Echocardiographer:              [name], MD  Pre-operative  Echo Findings: ? Severe bicuspid aortic stenosis ? Normal left ventricular systolic function  Post-operative Echo Findings: ? No paravalvular leak ? Normal left ventricular systolic function  ______________  12/24/19 ECHO IMPRESSIONS  1. Left ventricular ejection fraction, by visual estimation, is 60 to 65%. The left ventricle has normal function. There is mildly increased left ventricular hypertrophy.  2. Left ventricular diastolic parameters are consistent with Grade I diastolic dysfunction (impaired relaxation).  3. The left ventricle has no regional wall motion abnormalities.  4. Global right ventricle has normal systolic function.The right ventricular size is normal. No increase in right ventricular wall thickness.  5. Left atrial size was normal.  6. Right atrial size was normal.  7. The mitral valve is normal in structure. No evidence of mitral valve regurgitation. No evidence of mitral stenosis.  8. The tricuspid valve is normal in structure.  9. Aortic valve regurgitation is not visualized. No evidence of aortic valve sclerosis or stenosis. 10. The pulmonic valve was normal in structure. Pulmonic valve regurgitation is not visualized. 11. There is moderate dilatation of the ascending aorta measuring 45 mm. 12. The inferior vena cava is normal in size with greater than 50% respiratory variability, suggesting right atrial pressure of 3 mmHg. 13. S/P TAVR with a 26 mm Edwards-SAPIEN 3 Ultra valve, peak/mean gradients 17/11 mmHg. No paravalvular leak.  Aortic Valve: The aortic valve has been repaired/replaced. Aortic valve regurgitation is not visualized. The aortic  valve is structurally normal, with no evidence of sclerosis or stenosis. Aortic valve mean gradient measures 10.5 mmHg. Aortic valve peak  gradient measures 17.0 mmHg. Aortic valve area, by VTI measures 3.38 cm. 26 Edwards Sapien bioprosthetic, stented aortic valve (TAVR) valve is present in the aortic  position.  __________________  Echo 01/15/20 IMPRESSIONS  1. Left ventricular ejection fraction, by visual estimation, is 55 to 60%. The left ventricle has normal function. There is mildly increased left ventricular hypertrophy.  2. Left ventricular diastolic parameters are consistent with Grade I diastolic dysfunction (impaired relaxation).  3. The left ventricle has no regional wall motion abnormalities.  4. Global right ventricle has normal systolic function.The right ventricular size is normal. No increase in right ventricular wall thickness.  5. Left atrial size was mild-moderately dilated.  6. Right atrial size was normal.  7. The mitral valve is normal in structure. Trivial mitral valve regurgitation.  8. The tricuspid valve is normal in structure.  9. Aortic stent-valve (TAVR) prosthesis is well seated, without perivalvular leak.  10. Aortic valve mean gradient measures 13.7 mmHg.  11. Aortic valve peak gradient measures 24.4 mmHg.  12. Aortic valve regurgitation is not visualized.  13. The pulmonic valve was not well visualized. Pulmonic valve regurgitation is not visualized.  14. Aortic dilatation noted.  15. There is mild to moderate dilatation of the ascending aorta measuring  44 mm.  16. No significant change from prior study.  17. Prior images reviewed side by side.  18. When compared to the 12/24/2019 study, the TAVR gradients are very slightly higher. The changes in TAVR dimensionless index and calculated valve area are due to differences in LVOT pulsed wave sampling technique, rather than a true change in valve hemodynamics.   ASSESSMENT & PLAN:   Severe AS s/p TAVR: echo 01/15/20 shows EF 55%, normally functioning TAVR with a mean gradient of 14 mm hg and no PVL. He has NYHA class I symptoms. SBE prophylaxis discussed; he has amoxicillin. He is on monotherapy with aspirin given history of intracranial bleeding. He will continue on this indefinitely.   Will order  one year echo in January 2022   HTN: BP well controlled today. Continue Toprol XL 25mg  daily.   TAA: he has a known stable 4.5 cm ascending thoracic aortic aneurysm. CTA ordered by [name] 05/05/20 stable 4.4 cm   CAD:  Cath prior to TAVR 10/08/19 with 75% small D1 and 50% mid LAD No angina medical Rx ASA, beta blocker and statin   Medication Adjustments/Labs and Tests Ordered: Current medicines are reviewed at length with the patient today.  Concerns regarding medicines are outlined above.  Medication changes, Labs and Tests ordered today are listed in the Patient Instructions below. Patient Instructions  Medication Instructions:  *If you need a refill on your cardiac medications before your next appointment, please call your pharmacy*  Lab Work: If you have labs (blood work) drawn today and your tests are completely normal, you will receive your results only by: Marland Kitchen MyChart Message (if you have MyChart) OR . A paper copy in the mail If you have any lab test that is abnormal or we need to change your treatment, we will call you to review the results.  Testing/Procedures: None ordered today.   Follow-Up: At [facility] [place], you and your health needs are our priority.  As part of our continuing mission to provide you with exceptional heart care, we have created designated Provider Care Teams.  These Care Teams include  your primary Cardiologist (physician) and Advanced Practice Providers (APPs -  Physician Assistants and Nurse Practitioners) who all work together to provide you with the care you need, when you need it.  We recommend signing up for the patient portal called "MyChart".  Sign up information is provided on this After Visit Summary.  MyChart is used to connect with patients for Virtual Visits (Telemedicine).  Patients are able to view lab/test results, encounter notes, upcoming appointments, etc.  Non-urgent messages can be sent to your provider as well.   To learn more about  what you can do with MyChart, go to [email or web address].    Your next appointment:   6 month(s)  The format for your next appointment:   In Person  Provider:   You may see [name], MD or one of the following Advanced Practice Providers on your designated Care Team:    Truitt Merle, NP  Cecilie Kicks, NP  Kathyrn Drown, NP     Signed, [name], MD  05/07/2020 9:29 AM    [facility] [address], [place], [place]  [address] Phone: [phone]; Fax: [phone]

## 2020-04-28 NOTE — Telephone Encounter (Signed)
Left message for patient that lab order has been printed and faxed to number provided.

## 2020-04-29 ENCOUNTER — Other Ambulatory Visit: Payer: Medicare Other

## 2020-04-30 LAB — BASIC METABOLIC PANEL
BUN/Creatinine Ratio: 15 (ref 10–24)
BUN: 14 mg/dL (ref 8–27)
CO2: 27 mmol/L (ref 20–29)
Calcium: 9.4 mg/dL (ref 8.6–10.2)
Chloride: 105 mmol/L (ref 96–106)
Creatinine, Ser: 0.96 mg/dL (ref 0.76–1.27)
GFR calc Af Amer: 92 mL/min/{1.73_m2} (ref >59)
GFR calc non Af Amer: 80 mL/min/{1.73_m2} (ref >59)
Glucose: 94 mg/dL (ref 65–99)
Potassium: 5.2 mmol/L (ref 3.5–5.2)
Sodium: 144 mmol/L (ref 134–144)

## 2020-05-05 ENCOUNTER — Other Ambulatory Visit: Payer: Self-pay

## 2020-05-05 ENCOUNTER — Ambulatory Visit (HOSPITAL_COMMUNITY)
Admission: RE | Admit: 2020-05-05 | Discharge: 2020-05-05 | Disposition: A | Discharge status: Home / Self Care | Payer: Medicare Other | Source: Ambulatory Visit | Attending: Cardiovascular Disease | Admitting: Cardiovascular Disease

## 2020-05-05 ENCOUNTER — Encounter: Payer: Self-pay | Admitting: Physician Assistant

## 2020-05-05 ENCOUNTER — Ambulatory Visit (INDEPENDENT_AMBULATORY_CARE_PROVIDER_SITE_OTHER): Payer: Medicare Other | Admitting: Physician Assistant

## 2020-05-05 VITALS — BP 123/84 | HR 82 | Resp 16 | Ht 72.0 in | Wt 206.0 lb

## 2020-05-05 DIAGNOSIS — M19041 Primary osteoarthritis, right hand: Secondary | ICD-10-CM | POA: Diagnosis not present

## 2020-05-05 DIAGNOSIS — S065X9A Traumatic subdural hemorrhage with loss of consciousness of unspecified duration, initial encounter: Secondary | ICD-10-CM

## 2020-05-05 DIAGNOSIS — M170 Bilateral primary osteoarthritis of knee: Secondary | ICD-10-CM

## 2020-05-05 DIAGNOSIS — I350 Nonrheumatic aortic (valve) stenosis: Secondary | ICD-10-CM | POA: Diagnosis present

## 2020-05-05 DIAGNOSIS — M1A09X0 Idiopathic chronic gout, multiple sites, without tophus (tophi): Secondary | ICD-10-CM

## 2020-05-05 DIAGNOSIS — S76112S Strain of left quadriceps muscle, fascia and tendon, sequela: Secondary | ICD-10-CM

## 2020-05-05 DIAGNOSIS — S76111S Strain of right quadriceps muscle, fascia and tendon, sequela: Secondary | ICD-10-CM

## 2020-05-05 DIAGNOSIS — S065XAA Traumatic subdural hemorrhage with loss of consciousness status unknown, initial encounter: Secondary | ICD-10-CM

## 2020-05-05 DIAGNOSIS — M19071 Primary osteoarthritis, right ankle and foot: Secondary | ICD-10-CM | POA: Diagnosis not present

## 2020-05-05 DIAGNOSIS — M65341 Trigger finger, right ring finger: Secondary | ICD-10-CM

## 2020-05-05 DIAGNOSIS — M19072 Primary osteoarthritis, left ankle and foot: Secondary | ICD-10-CM

## 2020-05-05 DIAGNOSIS — M19042 Primary osteoarthritis, left hand: Secondary | ICD-10-CM

## 2020-05-05 DIAGNOSIS — Z5181 Encounter for therapeutic drug level monitoring: Secondary | ICD-10-CM

## 2020-05-05 DIAGNOSIS — G629 Polyneuropathy, unspecified: Secondary | ICD-10-CM

## 2020-05-05 DIAGNOSIS — M65351 Trigger finger, right little finger: Secondary | ICD-10-CM

## 2020-05-05 DIAGNOSIS — Z952 Presence of prosthetic heart valve: Secondary | ICD-10-CM

## 2020-05-05 DIAGNOSIS — A809 Acute poliomyelitis, unspecified: Secondary | ICD-10-CM

## 2020-05-05 MED ORDER — IOHEXOL 350 MG/ML SOLN
80.0000 mL | Freq: Once | INTRAVENOUS | Status: AC | PRN
  Administered 2020-05-05: 100 mL via INTRAVENOUS

## 2020-05-05 MED ORDER — ALLOPURINOL 300 MG PO TABS: 300.0000 mg | ORAL_TABLET | Freq: Every day | ORAL | 0 refills | Status: DC

## 2020-05-06 LAB — CBC WITH DIFFERENTIAL/PLATELET
Absolute Monocytes: 491 cells/uL (ref 200–950)
Basophils Absolute: 38 cells/uL (ref 0–200)
Basophils Relative: 0.6 %
Eosinophils Absolute: 107 cells/uL (ref 15–500)
Eosinophils Relative: 1.7 %
HCT: 42.4 % (ref 38.5–50.0)
Hemoglobin: 14.4 g/dL (ref 13.2–17.1)
Lymphs Abs: 1065 cells/uL (ref 850–3900)
MCH: 31.6 pg (ref 27.0–33.0)
MCHC: 34 g/dL (ref 32.0–36.0)
MCV: 93 fL (ref 80.0–100.0)
MPV: 11.8 fL (ref 7.5–12.5)
Monocytes Relative: 7.8 %
Neutro Abs: 4599 cells/uL (ref 1500–7800)
Neutrophils Relative %: 73 %
Platelets: 216 10*3/uL (ref 140–400)
RBC: 4.56 10*6/uL (ref 4.20–5.80)
RDW: 13.3 % (ref 11.0–15.0)
Total Lymphocyte: 16.9 %
WBC: 6.3 10*3/uL (ref 3.8–10.8)

## 2020-05-06 LAB — ALT: ALT: 27 U/L (ref 9–46)

## 2020-05-06 LAB — AST: AST: 23 U/L (ref 10–35)

## 2020-05-06 LAB — URIC ACID: Uric Acid, Serum: 3.8 mg/dL — ABNORMAL LOW (ref 4.0–8.0)

## 2020-05-06 NOTE — Progress Notes (Signed)
LFTs WNL.  CBC WNL.  Uric acid is 3.8.  Please advise the patient to continue taking allopurinol as prescribed.

## 2020-05-06 NOTE — Telephone Encounter (Signed)
-----   Message from Josue Hector, MD sent at 05/06/2020  8:59 AM EDT ----- Aorta stable 4.4 cm f/u CT in a year

## 2020-05-07 ENCOUNTER — Other Ambulatory Visit: Payer: Self-pay

## 2020-05-07 ENCOUNTER — Encounter: Payer: Self-pay | Admitting: Cardiovascular Disease

## 2020-05-07 ENCOUNTER — Ambulatory Visit (INDEPENDENT_AMBULATORY_CARE_PROVIDER_SITE_OTHER): Payer: Medicare Other | Admitting: Cardiovascular Disease

## 2020-05-07 VITALS — BP 126/66 | HR 85 | Ht 72.0 in | Wt 206.0 lb

## 2020-05-07 DIAGNOSIS — Z952 Presence of prosthetic heart valve: Secondary | ICD-10-CM | POA: Diagnosis not present

## 2020-05-07 NOTE — Patient Instructions (Signed)

## 2020-05-12 NOTE — Progress Notes (Signed)
[address]       [place] [address]             [phone]      Jose [name] [place] Medical Record [id number] Date of Birth: 1951-10-12  Referring: Grace Isaac, MD Primary Care: [name], MD Primary Cardiologist: [name], MD   Chief Complaint:   POST OP FOLLOW UP Date of Procedure:                12/23/2019 Preoperative Diagnosis:      Severe Aortic Stenosis  Postoperative Diagnosis:    Same  Procedure:       Transcatheter Aortic Valve Replacement - Percutaneous Right Transfemoral Approach             Edwards Sapien 3 Ultra THV (size 26 mm, model # 9750TFX, serial # [id number])               History of Present Illness:      Underwent successful TAVR  01/12/20 with a 26 mm Edwards Sapien 3 Ultra THV via the TF approach. Post op echo 01/15/20  showed normal LV function, normally functioning TAVR with a mean gradient of 13.7 mmHg peak 24.4 mmHg DVI 0.36 and AVA 1/63 cm2  and no PVL. He was discharged on aspirin and plavix. Later we discontinued plavix given history of intracranial bleed.     Past Medical History:  Diagnosis Date  . Arthritis    R shoulder, great toes, hands- has gout & has injections in knees with Dr. Estanislado Pandy   . Bicuspid aortic valve with ascending aorta 4.0 to 4.5 cm in diameter   . Cancer ([place])    skin ca-   . GERD (gastroesophageal reflux disease)   . Headache   . History of hiatal hernia   . Hypertension   . SDH (subdural hematoma) ([place])   . Severe aortic stenosis   . Vertebral artery aneurysm [facility])      Social History   Tobacco Use  Smoking Status Never Smoker  Smokeless Tobacco Never Used    Social History   Substance and Sexual Activity  Alcohol Use Yes  . Alcohol/week: 8.0 standard drinks  . Types: 7 Glasses of wine, 1 Shots of liquor per week     Allergies  Allergen Reactions  . Ciprofloxacin Other (See Comments)    Patient states instructed not to take due to history of Aneurysms   . Quinolones     Patient was warned about not using Cipro and similar antibiotics. Recent studies have raised concern that fluoroquinolone antibiotics could be associated with an increased risk of aortic aneurysm Fluoroquinolones have non-antimicrobial properties that might jeopardise the integrity of the extracellular matrix of the vascular wall In a  propensity score matched cohort study in [place], there was a 66% increased rate of aortic aneurysm or dissection associated with oral fluoroquinolone use, compared wit    Current Outpatient Medications  Medication Sig Dispense Refill  . allopurinol (ZYLOPRIM) 300 MG tablet Take 1 tablet (300 mg total) by mouth daily. 90 tablet 0  . amoxicillin (AMOXIL) 500 MG tablet Take 4 capsules (2,000 mg) 1 hour prior to all dental visits. 8 tablet 11  . aspirin 81 MG chewable tablet Chew 1 tablet (81 mg total) by mouth daily.    Marland Kitchen atorvastatin (LIPITOR) 20 MG tablet Take 1 tablet (20 mg total) by mouth daily at 6 PM. 90 tablet 3  . cephALEXin (KEFLEX) 500  MG capsule     . clopidogrel (PLAVIX) 75 MG tablet     . colchicine 0.6 MG tablet colchicine 0.6 mg tablet  TAKE 1 TABLET (0.6 MG TOTAL) BY MOUTH DAILY AS NEEDED (FOR GOUT FLAREUPS).    Marland Kitchen diclofenac Sodium (VOLTAREN) 1 % GEL Voltaren 1 % topical gel    . esomeprazole (NEXIUM) 20 MG capsule Take 20 mg by mouth daily at 12 noon.    . metoprolol succinate (TOPROL-XL) 25 MG 24 hr tablet Take 25 mg by mouth daily.    . Multiple Vitamin (MULTIVITAMIN WITH MINERALS) TABS tablet Take 1 tablet by mouth daily.    . traMADol (ULTRAM) 50 MG tablet Take 1-2 tablets (50-100 mg total) by mouth every 4 (four) hours as needed for moderate pain. 20 tablet 0   No current facility-administered medications for this visit.       Physical Exam: No exam   Diagnostic Studies & Laboratory data:     Recent Radiology Findings:  CT ANGIO CHEST AORTA W/CM & OR WO/CM  Result Date: 05/06/2020 CLINICAL DATA:  Ascending  aortic aneurysm. History of aortic stenosis status post TAVR. EXAM: CT ANGIOGRAPHY CHEST WITH CONTRAST TECHNIQUE: Multidetector CT imaging of the chest was performed using the standard protocol during bolus administration of intravenous contrast. Multiplanar CT image reconstructions and MIPs were obtained to evaluate the vascular anatomy. CONTRAST:  135mL OMNIPAQUE IOHEXOL 350 MG/ML SOLN COMPARISON:  11/03/2019 FINDINGS: Cardiovascular: Unchanged ascending aortic aneurysm with maximal diameter 4.4 cm. Interval TAVR. Mild cardiomegaly. No pericardial effusion. Normal caliber pulmonary arteries without central pulmonary emboli identified on this nondedicated study. Mediastinum/Nodes: No enlarged axillary, mediastinal, or hilar lymph nodes. Unremarkable thyroid and esophagus. Lungs/Pleura: No pleural effusion or pneumothorax. Mild respiratory motion artifact. No lung consolidation or mass. Upper Abdomen: Subcentimeter hypodense lesions in the left kidney, similar to the prior study and too small to fully characterize. Small duodenal diverticulum. Musculoskeletal: No acute osseous abnormality or suspicious osseous lesion. Moderate thoracic disc degeneration. Review of the MIP images confirms the above findings. IMPRESSION: 1. Unchanged 4.4 cm ascending aortic aneurysm. 2. Interval TAVR. Aortic Atherosclerosis (ICD10-I70.0). Aortic aneurysm NOS (ICD10-I71.9). Electronically Signed   By: Logan Bores M.D.   On: 05/06/2020 07:52     Recent Lab Findings: Lab Results  Component Value Date   WBC 6.3 05/05/2020   HGB 14.4 05/05/2020   HCT 42.4 05/05/2020   PLT 216 05/05/2020   GLUCOSE 94 04/29/2020   CHOL 169 11/05/2019   TRIG 56 11/05/2019   HDL 76 11/05/2019   LDLDIRECT 164.4 12/25/2007   LDLCALC 82 11/05/2019   ALT 27 05/05/2020   AST 23 05/05/2020   NA 144 04/29/2020   K 5.2 04/29/2020   CL 105 04/29/2020   CREATININE 0.96 04/29/2020   BUN 14 04/29/2020   CO2 27 04/29/2020   INR 0.9 12/18/2019    HGBA1C 5.3 12/18/2019      Assessment / Plan:   Stable CT of the chest with ascending aorta 4.4 cm-discussed this with the patient over the telephone He notes he has continued follow-up with Dr. Frances Nickels cardiology-he will discuss with cardiology team continuing with yearly CT of the chest in their office.   Medication Changes: No orders of the defined types were placed in this encounter.     Grace Isaac MD      [address] [place] [address] Office [phone]     05/14/2020 10:43 AM

## 2020-05-13 ENCOUNTER — Telehealth (INDEPENDENT_AMBULATORY_CARE_PROVIDER_SITE_OTHER): Payer: Medicare Other | Admitting: Cardiothoracic Surgery

## 2020-05-13 ENCOUNTER — Other Ambulatory Visit: Payer: Self-pay

## 2020-05-13 DIAGNOSIS — I350 Nonrheumatic aortic (valve) stenosis: Secondary | ICD-10-CM

## 2020-06-24 ENCOUNTER — Other Ambulatory Visit: Payer: Self-pay | Admitting: Cardiovascular Disease

## 2020-07-18 ENCOUNTER — Other Ambulatory Visit: Payer: Self-pay | Admitting: Physician Assistant

## 2020-07-19 NOTE — Telephone Encounter (Signed)
Please schedule patient for a follow up visit. Patient due November 2021. Thanks!  

## 2020-07-19 NOTE — Telephone Encounter (Signed)
Last Visit: 05/05/2020 Next Visit: due November 2021. Message sent to the front to schedule patient. Labs: 05/05/2020 LFTs WNL. CBC WNL. Uric acid is 3.8. , 04/29/2020 BMP-WNL  Current Dose per office note 05/05/2020: allopurinol 300 mg 1 tablet by mouth daily  DX: Idiopathic chronic gout of multiple sites without tophus  Okay to refill per Dr. Estanislado Pandy

## 2020-07-19 NOTE — Telephone Encounter (Signed)
I LMOM for patient to call for a November rov with Lovena Le.

## 2020-08-02 ENCOUNTER — Telehealth: Payer: Self-pay | Admitting: Cardiovascular Disease

## 2020-08-02 DIAGNOSIS — Z952 Presence of prosthetic heart valve: Secondary | ICD-10-CM

## 2020-08-02 DIAGNOSIS — I350 Nonrheumatic aortic (valve) stenosis: Secondary | ICD-10-CM

## 2020-08-02 NOTE — Telephone Encounter (Signed)
Per Dr. Kyla Balzarine note on 05/07/20, will need echo in January 2022. Placed order to have done in January. Called patient to let him know.

## 2020-08-02 NOTE — Telephone Encounter (Signed)
Patient is requesting a new order for an echocardiogram.

## 2020-08-16 ENCOUNTER — Encounter: Payer: Self-pay | Admitting: Rheumatology

## 2020-08-17 NOTE — Progress Notes (Signed)
Office Visit Note  Patient: Jose [name]             Date of Birth: 1951-01-14           MRN: [id number]             PCP: [name], MD Referring: [name], MD Visit Date: 08/31/2020 Occupation: @GUAROCC @  Subjective:  Right ring and little trigger finger.   History of Present Illness: Jose [name] is a 69 y.o. male with history of gout and osteoarthritis.  He states he has been having pain and discomfort in his right ring and trigger finger for the last 1 month.  He has had injections in the past and the ring finger.  He has not had any gout flare since the last visit.  He continues to have some discomfort of osteoarthritis but is not very bothersome.  Activities of Daily Living:  Patient reports morning stiffness for 30   minutes.   Patient Reports nocturnal pain.  Difficulty dressing/grooming: Denies Difficulty climbing stairs: Reports Difficulty getting out of chair: Denies Difficulty using hands for taps, buttons, cutlery, and/or writing: Denies  Review of Systems  Constitutional: Positive for fatigue.  HENT: Negative for mouth sores, mouth dryness and nose dryness.   Eyes: Negative for itching and dryness.  Respiratory: Negative for shortness of breath and difficulty breathing.   Cardiovascular: Negative for chest pain and palpitations.  Gastrointestinal: Negative for blood in stool, constipation and diarrhea.  Endocrine: Negative for increased urination.  Genitourinary: Negative for difficulty urinating.  Musculoskeletal: Positive for arthralgias, joint pain and morning stiffness. Negative for joint swelling, myalgias, muscle tenderness and myalgias.  Skin: Negative for color change, rash and redness.  Allergic/Immunologic: Negative for susceptible to infections.  Neurological: Negative for dizziness, numbness, headaches, memory loss and weakness.  Hematological: Negative for bruising/bleeding tendency.  Psychiatric/Behavioral: Negative for confusion.     PMFS History:  Patient Active Problem List   Diagnosis Date Noted  . Hypertension   . Severe aortic stenosis 10/08/2019  . Candidiasis   . Abnormality of gait   . Hypoalbuminemia due to protein-calorie malnutrition ([place])   . History of gout   . Sleep disturbance   . Constipation   . Rupture of quadriceps tendon, right, sequela 06/19/2017  . Quadriceps tendon rupture, left, sequela 06/19/2017  . Acute blood loss anemia 06/19/2017  . Fall   . History of subdural hematoma   . Post-operative pain   . Recurrent falls   . Quadriceps tendon rupture 06/15/2017  . Primary osteoarthritis of both knees 02/28/2017  . Primary osteoarthritis of both hands 02/28/2017  . Uricacidemia 02/28/2017  . Pain in joint of left knee 02/07/2017  . Idiopathic chronic gout, unspecified site, without tophus (tophi) 11/29/2016  . HEMATURIA UNSPECIFIED 01/25/2009  . ABDOMINAL PAIN 01/25/2009  . XERODERMA 03/10/2008  . UNS ADVRS EFF UNS RX MEDICINAL&BIOLOGICAL SBSTNC 03/10/2008  . ADJUSTMENT REACTION WITH PHYSICAL SYMPTOMS 02/14/2008  . MUSCLE SPASM 02/14/2008  . ACUTE PROSTATITIS 12/16/2007  . BREAST LUMP OR MASS, RIGHT 07/12/2007  . H/O: RCT (rotator cuff tear) 01/13/2007  . GOUT 01/08/2007    Past Medical History:  Diagnosis Date  . Arthritis    R shoulder, great toes, hands- has gout & has injections in knees with Dr. Estanislado Pandy   . Bicuspid aortic valve with ascending aorta 4.0 to 4.5 cm in diameter   . Cancer ([place])    skin ca-   . GERD (gastroesophageal reflux disease)   .  Headache   . History of hiatal hernia   . Hypertension   . SDH (subdural hematoma) ([place])   . Severe aortic stenosis   . Vertebral artery aneurysm ([facility])     Family History  Problem Relation Age of Onset  . Parkinson's disease Mother   . Heart disease Father   . Parkinson's disease Brother   . Diabetes Brother    Past Surgical History:  Procedure Laterality Date  . BRAIN SURGERY  2016   evacuation of SDH  .  CARDIAC CATHETERIZATION    . CARPAL TUNNEL RELEASE Bilateral   . GREEN LIGHT LASER TURP (TRANSURETHRAL RESECTION OF PROSTATE  04/27/2017  . KNEE ARTHROPLASTY    . LEFT HEART CATH AND CORONARY ANGIOGRAPHY N/A 10/08/2019   Procedure: LEFT HEART CATH AND CORONARY ANGIOGRAPHY;  Surgeon: Sherren Mocha, MD;  Location: [facility];  Service: Cardiovascular;  Laterality: N/A;  . QUADRICEPS TENDON REPAIR Bilateral 06/15/2017   Procedure: REPAIR QUADRICEP TENDON;  Surgeon: Meredith Pel, MD;  Location: [facility];  Service: Orthopedics;  Laterality: Bilateral;  . SHOULDER SURGERY Right   . TRANSCATHETER AORTIC VALVE REPLACEMENT, TRANSFEMORAL  12/23/2019  . TRANSCATHETER AORTIC VALVE REPLACEMENT, TRANSFEMORAL N/A 12/23/2019   Procedure: TRANSCATHETER AORTIC VALVE REPLACEMENT, TRANSFEMORAL;  Surgeon: Sherren Mocha, MD;  Location: [facility];  Service: Open Heart Surgery;  Laterality: N/A;  . VASCULAR SURGERY     Social History   Social History Narrative  . Not on file   Immunization History  Administered Date(s) Administered  . PFIZER SARS-COV-2 Vaccination 02/19/2020, 03/17/2020     Objective: Vital Signs: BP 108/77 (BP Location: Left Arm, Patient Position: Sitting, Cuff Size: Normal)   Pulse 94   Resp 15   Ht 6' (1.829 m)   Wt 209 lb 12.8 oz (95.2 kg)   BMI 28.45 kg/m    Physical Exam Vitals and nursing note reviewed.  Constitutional:      Appearance: He is well-developed.  HENT:     Head: Normocephalic and atraumatic.  Eyes:     Conjunctiva/sclera: Conjunctivae normal.     Pupils: Pupils are equal, round, and reactive to light.  Cardiovascular:     Rate and Rhythm: Normal rate and regular rhythm.     Heart sounds: Normal heart sounds.  Pulmonary:     Effort: Pulmonary effort is normal.     Breath sounds: Normal breath sounds.  Abdominal:     General: Bowel sounds are normal.     Palpations: Abdomen is soft.  Musculoskeletal:     Cervical back: Normal range of motion and  neck supple.  Skin:    General: Skin is warm and dry.     Capillary Refill: Capillary refill takes less than 2 seconds.  Neurological:     Mental Status: He is alert and oriented to person, place, and time.  Psychiatric:        Behavior: Behavior normal.      Musculoskeletal Exam: C-spine was in good range of motion.  Shoulder abduction was limited to 90 degrees bilaterally.  Elbow joints with good range of motion.  He has bilateral PIP and DIP thickening.  He had flexor tenosynovitis of the right ring and little finger.  Hip joints and knee joints in good range of motion.  He had no tenderness over ankles or MTPs.  CDAI Exam: CDAI Score: -- Patient Global: --; Provider Global: -- Swollen: --; Tender: -- Joint Exam 08/31/2020   No joint exam has been documented for this  visit   There is currently no information documented on the homunculus. Go to the Rheumatology activity and complete the homunculus joint exam.  Investigation: No additional findings.  Imaging: No results found.  Recent Labs: Lab Results  Component Value Date   WBC 6.3 05/05/2020   HGB 14.4 05/05/2020   PLT 216 05/05/2020   NA 144 04/29/2020   K 5.2 04/29/2020   CL 105 04/29/2020   CO2 27 04/29/2020   GLUCOSE 94 04/29/2020   BUN 14 04/29/2020   CREATININE 0.96 04/29/2020   BILITOT 0.5 12/18/2019   ALKPHOS 65 12/18/2019   AST 23 05/05/2020   ALT 27 05/05/2020   PROT 6.8 12/18/2019   ALBUMIN 3.9 12/18/2019   CALCIUM 9.4 04/29/2020   GFRAA 92 04/29/2020   May 05, 2020 CBC and CMP were normal, uric acid 3.8  Speciality Comments: No specialty comments available.  Procedures:  Hand/UE Inj: R ring A1 for trigger finger on 08/31/2020 12:30 PM Indications: pain, tendon swelling and therapeutic Details: 27 G needle, ultrasound-guided volar approach Medications: 0.5 mL lidocaine 1 %; 10 mg triamcinolone acetonide 40 MG/ML Aspirate: 0 mL Procedure, treatment alternatives, risks and benefits explained,  specific risks discussed. Immediately prior to procedure a time out was called to verify the correct patient, procedure, equipment, support staff and site/side marked as required. Patient was prepped and draped in the usual sterile fashion.   Hand/UE Inj: R small A1 for trigger finger on 08/31/2020 12:31 PM Indications: pain, tendon swelling and therapeutic Details: 27 G needle, ultrasound-guided volar approach Medications: 0.5 mL lidocaine 1 %; 10 mg triamcinolone acetonide 40 MG/ML Aspirate: 0 mL Procedure, treatment alternatives, risks and benefits explained, specific risks discussed. Immediately prior to procedure a time out was called to verify the correct patient, procedure, equipment, support staff and site/side marked as required. Patient was prepped and draped in the usual sterile fashion.     Allergies: Ciprofloxacin and Quinolones   Assessment / Plan:     Visit Diagnoses: Idiopathic chronic gout of multiple sites without tophus - allopurinol 300 mg 1 tablet by mouth daily colchicine 0.6 mg 1 tablet by mouth daily as needed during flares.  He has not had any gout flare in a long time.  His uric acid is in desirable range.  Uric acid: 05/05/2020 3.8  Primary osteoarthritis of both hands-he continues to have some stiffness.  Joint protection was discussed.  Primary osteoarthritis of both knees-he has knee joint stiffness but no discomfort.  Primary osteoarthritis of both feet-proper fitting shoes were discussed.  Neuropathy  Trigger little finger of right hand - injected 10/07/2019.Marland Kitchen  Today after informed consent was obtained the flexor tendon sheath was injected with cortisone as described above.  He tolerated the procedure well.  Postprocedure instructions were given and a splint was applied.  Trigger finger, right ring finger - injected 10/07/2019.  Informed consent was obtained.  Per patient's request the flexor tendon sheath was injected with cortisone as described above.   Postprocedure instructions were given.  Her splint was applied.  Rupture of quadriceps tendon, right, sequela  Quadriceps tendon rupture, left, sequela - He had a left revision quadriceps tendon reconstruction on 07/02/19 performed by Dr. Elisabeth Most  H/O aortic valve replacement - Performed on 12/23/2019 by Dr. Burt Knack.  He continues to follow-up with Dr. Johnsie Cancel.  Subdural hematoma ([facility])  Polio  Educated about COVID-19 virus infection-he is fully vaccinated against COVID-19.  Use of booster was discussed.  Use of monoclonal antibodies were also  discussed in case he develops an infection.  He has been advised to continue to use mask, social standing and hand hygiene.  Orders: Orders Placed This Encounter  Procedures  . Hand/UE Inj  . Hand/UE Inj   No orders of the defined types were placed in this encounter.     Follow-Up Instructions: Return in about 6 months (around 02/28/2021) for Osteoarthritis, Gout.   Bo Merino, MD  Note - This record has been created using Editor, commissioning.  Chart creation errors have been sought, but may not always  have been located. Such creation errors do not reflect on  the standard of medical care.

## 2020-08-31 ENCOUNTER — Encounter: Payer: Self-pay | Admitting: Rheumatology

## 2020-08-31 ENCOUNTER — Other Ambulatory Visit: Payer: Self-pay

## 2020-08-31 ENCOUNTER — Ambulatory Visit (INDEPENDENT_AMBULATORY_CARE_PROVIDER_SITE_OTHER): Payer: Medicare Other | Admitting: Rheumatology

## 2020-08-31 VITALS — BP 108/77 | HR 94 | Resp 15 | Ht 72.0 in | Wt 209.8 lb

## 2020-08-31 DIAGNOSIS — S76112S Strain of left quadriceps muscle, fascia and tendon, sequela: Secondary | ICD-10-CM

## 2020-08-31 DIAGNOSIS — M65351 Trigger finger, right little finger: Secondary | ICD-10-CM

## 2020-08-31 DIAGNOSIS — A809 Acute poliomyelitis, unspecified: Secondary | ICD-10-CM

## 2020-08-31 DIAGNOSIS — M19071 Primary osteoarthritis, right ankle and foot: Secondary | ICD-10-CM

## 2020-08-31 DIAGNOSIS — M19041 Primary osteoarthritis, right hand: Secondary | ICD-10-CM

## 2020-08-31 DIAGNOSIS — M65341 Trigger finger, right ring finger: Secondary | ICD-10-CM | POA: Diagnosis not present

## 2020-08-31 DIAGNOSIS — S065XAA Traumatic subdural hemorrhage with loss of consciousness status unknown, initial encounter: Secondary | ICD-10-CM

## 2020-08-31 DIAGNOSIS — M1A09X0 Idiopathic chronic gout, multiple sites, without tophus (tophi): Secondary | ICD-10-CM

## 2020-08-31 DIAGNOSIS — M19042 Primary osteoarthritis, left hand: Secondary | ICD-10-CM

## 2020-08-31 DIAGNOSIS — M170 Bilateral primary osteoarthritis of knee: Secondary | ICD-10-CM | POA: Diagnosis not present

## 2020-08-31 DIAGNOSIS — G629 Polyneuropathy, unspecified: Secondary | ICD-10-CM

## 2020-08-31 DIAGNOSIS — M19072 Primary osteoarthritis, left ankle and foot: Secondary | ICD-10-CM

## 2020-08-31 DIAGNOSIS — S065X9A Traumatic subdural hemorrhage with loss of consciousness of unspecified duration, initial encounter: Secondary | ICD-10-CM

## 2020-08-31 DIAGNOSIS — Z7189 Other specified counseling: Secondary | ICD-10-CM

## 2020-08-31 DIAGNOSIS — S76111S Strain of right quadriceps muscle, fascia and tendon, sequela: Secondary | ICD-10-CM

## 2020-08-31 DIAGNOSIS — Z952 Presence of prosthetic heart valve: Secondary | ICD-10-CM

## 2020-08-31 MED ORDER — TRIAMCINOLONE ACETONIDE 40 MG/ML IJ SUSP
10.0000 mg | INTRAMUSCULAR | Status: AC | PRN
  Administered 2020-08-31: 10 mg

## 2020-08-31 MED ORDER — LIDOCAINE HCL 1 % IJ SOLN
0.5000 mL | INTRAMUSCULAR | Status: AC | PRN
  Administered 2020-08-31: .5 mL

## 2020-08-31 NOTE — Patient Instructions (Signed)
These return in November for CBC with differential, CMP with GFR and uric acid

## 2020-09-02 ENCOUNTER — Telehealth: Payer: Self-pay | Admitting: *Deleted

## 2020-09-02 ENCOUNTER — Other Ambulatory Visit: Payer: Self-pay | Admitting: Physician Assistant

## 2020-09-02 NOTE — Telephone Encounter (Signed)
[name], Jose A  Mychart, Generic 2 hours ago (11:14 AM)   I am a bit confused. Was told during my last appointment with Dr. Olive Bass that in six months I needed to see Nell Range for a follow up.  I got a letter in the mail a couple of weeks ago to call and make an appointment with her for Oct/Nov. I've called and  spoke with scheduling who said someone would be calling me to set that appointment up. Today I get a notice on my chart that I have an echo & appointment with Katie on 12/22/20. Do those appointments take the place of meeting with Katie in Oct/Nov??? Or do I still need an appointment with her next month.  Please clear this up.  Regards,  [name]   Patient sent above message to scheduling regarding appointment.  Reviewed with [name] and patient will not need appointment in November 2021.  He should follow up with K. Thompson in January 2022 with echo the same day. I replied to patient's message with this information.

## 2020-09-02 NOTE — Telephone Encounter (Signed)
   Eileen Stanford, PA-C  Jose Hector, MD; Thompson Grayer, RN; Aris Georgia, Olin Hauser, RN Perfect, if he needs (or wants) to be seen sooner we can push it 60 days before the anniversary of his valve surgery 12/23/2019 and still be complaint with the TVT registry. So anytime after 11/5... feel free to move him , if that's what he wants. Thanks pat!  KT    Patient notified. He would like to keep January appointment as scheduled.  He is asking if fasting lab work needed day of appointment.  Chart reviewed and patient was to have lipid and liver profiles checked in Feb 2021 but he had to cancel.  He reports he has not had checked anywhere else.   He will be in [place] on November 3 and will come to office for fasting lab work. Patient aware he can call back to schedule sooner TAVR follow up if he would like

## 2020-09-22 ENCOUNTER — Encounter: Payer: Self-pay | Admitting: Rheumatology

## 2020-09-22 NOTE — Telephone Encounter (Signed)
Low uric acid is not an issue.  If he has not had a flare in a long time we can reduce the allopurinol to 200 mg p.o. daily.  This could be discussed at the follow-up visit.

## 2020-10-06 NOTE — Progress Notes (Signed)
Office Visit Note  Patient: Jose [name]             Date of Birth: 13-Nov-1951           MRN: [id number]             PCP: [name], MD Referring: [name], MD Visit Date: 10/20/2020 Occupation: @GUAROCC @  Subjective:  Medication monitoring   History of Present Illness: Jose [name] is a 69 y.o. male with history of gout and osteoarthritis. He denies any recent gout flares. He is taking allopurinol 300 mg 1 tablet by mouth daily and colchicine 0.6 mg 1 tablet by mouth daily as needed during flares. He has not needed to take colchicine recently.  He states his main triggers for gout flares are mussels and old european wine, which he able to avoid eating/drinking.  He denies any tophi.   He reports he has been trying to increase his walking exercise regimen to 10,000 steps daily.  He states he recently developed a left knee joint effusion and was experiencing nocturnal pain.  He underwent quadriceps tendon repair x2 on the left knee in the past (revision in July 2020).  He had a cortisone injection performed recently, which resolved his left knee joint pain and inflammation.  He followed up with his orthopedic surgeon, Dr. Elisabeth Most in [place] yesterday on 10/19/20. He had updated x-rays and is planning on restarting physical therapy for strengthening exercises.     Activities of Daily Living:  Patient reports morning stiffness for 5-6 minutes.   Patient Reports nocturnal pain.  Difficulty dressing/grooming: Denies Difficulty climbing stairs: Reports Difficulty getting out of chair: Denies Difficulty using hands for taps, buttons, cutlery, and/or writing: Denies  Review of Systems  Constitutional: Negative for fatigue and night sweats.  HENT: Negative for mouth sores, mouth dryness and nose dryness.   Eyes: Positive for dryness. Negative for pain, redness and itching.  Respiratory: Negative for shortness of breath and difficulty breathing.   Cardiovascular: Negative for  chest pain, palpitations, hypertension, irregular heartbeat and swelling in legs/feet.  Gastrointestinal: Negative for blood in stool, constipation and diarrhea.  Endocrine: Negative for increased urination.  Genitourinary: Negative for difficulty urinating and painful urination.  Musculoskeletal: Positive for arthralgias, joint pain and morning stiffness. Negative for joint swelling, myalgias, muscle weakness, muscle tenderness and myalgias.  Skin: Negative for color change, rash, hair loss, nodules/bumps, redness, skin tightness, ulcers and sensitivity to sunlight.  Allergic/Immunologic: Negative for susceptible to infections.  Neurological: Negative for dizziness, fainting, numbness, headaches, memory loss, night sweats and weakness.  Hematological: Negative for bruising/bleeding tendency and swollen glands.  Psychiatric/Behavioral: Negative for depressed mood, confusion and sleep disturbance. The patient is not nervous/anxious.     PMFS History:  Patient Active Problem List   Diagnosis Date Noted  . Hypertension   . Severe aortic stenosis 10/08/2019  . Candidiasis   . Abnormality of gait   . Hypoalbuminemia due to protein-calorie malnutrition ([place])   . History of gout   . Sleep disturbance   . Constipation   . Rupture of quadriceps tendon, right, sequela 06/19/2017  . Quadriceps tendon rupture, left, sequela 06/19/2017  . Acute blood loss anemia 06/19/2017  . Fall   . History of subdural hematoma   . Post-operative pain   . Recurrent falls   . Quadriceps tendon rupture 06/15/2017  . Primary osteoarthritis of both knees 02/28/2017  . Primary osteoarthritis of both hands 02/28/2017  . Uricacidemia 02/28/2017  . Pain in  joint of left knee 02/07/2017  . Idiopathic chronic gout, unspecified site, without tophus (tophi) 11/29/2016  . HEMATURIA UNSPECIFIED 01/25/2009  . ABDOMINAL PAIN 01/25/2009  . XERODERMA 03/10/2008  . UNS ADVRS EFF UNS RX MEDICINAL&BIOLOGICAL SBSTNC 03/10/2008   . ADJUSTMENT REACTION WITH PHYSICAL SYMPTOMS 02/14/2008  . MUSCLE SPASM 02/14/2008  . ACUTE PROSTATITIS 12/16/2007  . BREAST LUMP OR MASS, RIGHT 07/12/2007  . H/O: RCT (rotator cuff tear) 01/13/2007  . GOUT 01/08/2007    Past Medical History:  Diagnosis Date  . Arthritis    R shoulder, great toes, hands- has gout & has injections in knees with Dr. Estanislado Pandy   . Bicuspid aortic valve with ascending aorta 4.0 to 4.5 cm in diameter   . Cancer ([place])    skin ca-   . GERD (gastroesophageal reflux disease)   . Headache   . History of hiatal hernia   . Hypertension   . SDH (subdural hematoma) ([place])   . Severe aortic stenosis   . Vertebral artery aneurysm ([facility])     Family History  Problem Relation Age of Onset  . Parkinson's disease Mother   . Heart disease Father   . Parkinson's disease Brother   . Diabetes Brother    Past Surgical History:  Procedure Laterality Date  . BRAIN SURGERY  2016   evacuation of SDH  . CARDIAC CATHETERIZATION    . CARPAL TUNNEL RELEASE Bilateral   . GREEN LIGHT LASER TURP (TRANSURETHRAL RESECTION OF PROSTATE  04/27/2017  . KNEE ARTHROPLASTY    . LEFT HEART CATH AND CORONARY ANGIOGRAPHY N/A 10/08/2019   Procedure: LEFT HEART CATH AND CORONARY ANGIOGRAPHY;  Surgeon: Sherren Mocha, MD;  Location: [facility];  Service: Cardiovascular;  Laterality: N/A;  . QUADRICEPS TENDON REPAIR Bilateral 06/15/2017   Procedure: REPAIR QUADRICEP TENDON;  Surgeon: Meredith Pel, MD;  Location: [facility];  Service: Orthopedics;  Laterality: Bilateral;  . SHOULDER SURGERY Right   . TRANSCATHETER AORTIC VALVE REPLACEMENT, TRANSFEMORAL  12/23/2019  . TRANSCATHETER AORTIC VALVE REPLACEMENT, TRANSFEMORAL N/A 12/23/2019   Procedure: TRANSCATHETER AORTIC VALVE REPLACEMENT, TRANSFEMORAL;  Surgeon: Sherren Mocha, MD;  Location: [facility];  Service: Open Heart Surgery;  Laterality: N/A;  . VASCULAR SURGERY     Social History   Social History Narrative  . Not on file    Immunization History  Administered Date(s) Administered  . PFIZER SARS-COV-2 Vaccination 02/19/2020, 03/17/2020     Objective: Vital Signs: BP 132/89 (BP Location: Left Arm, Patient Position: Sitting, Cuff Size: Normal)   Pulse (!) 54   Resp 15   Ht 6' (1.829 m)   Wt 207 lb (93.9 kg)   BMI 28.07 kg/m    Physical Exam Vitals and nursing note reviewed.  Constitutional:      Appearance: He is well-developed.  HENT:     Head: Normocephalic and atraumatic.  Eyes:     Conjunctiva/sclera: Conjunctivae normal.     Pupils: Pupils are equal, round, and reactive to light.  Pulmonary:     Effort: Pulmonary effort is normal.  Abdominal:     Palpations: Abdomen is soft.  Musculoskeletal:     Cervical back: Normal range of motion and neck supple.  Skin:    General: Skin is warm and dry.     Capillary Refill: Capillary refill takes less than 2 seconds.  Neurological:     Mental Status: He is alert and oriented to person, place, and time.  Psychiatric:        Behavior: Behavior  normal.      Musculoskeletal Exam: C-spine good ROM.  Shoulder joint abduction is limited to 90 degrees bilaterally.  Elbow joints good ROM with no tenderness or inflammation.  No tophi noted.  Wrist joints, MCPs, PIPs ,and DIPs good ROM with no synovitis.  PIP and DIP thickening consistent with osteoarthritis of both hands.  No trigger fingers noted.  Hip joints and knee joints have good ROM with no discomfort.  No warmth or effusion of knee joints noted.  Ankle joints good ROM with no tenderness or inflammation. Small varicose veins noted on the anteromedial aspect of his right lower extremity.  No pedal edema noted.   CDAI Exam: CDAI Score: -- Patient Global: --; Provider Global: -- Swollen: --; Tender: -- Joint Exam 10/20/2020   No joint exam has been documented for this visit   There is currently no information documented on the homunculus. Go to the Rheumatology activity and complete the homunculus  joint exam.  Investigation: No additional findings.  Imaging: No results found.  Recent Labs: Lab Results  Component Value Date   WBC 6.3 05/05/2020   HGB 14.4 05/05/2020   PLT 216 05/05/2020   NA 144 04/29/2020   K 5.2 04/29/2020   CL 105 04/29/2020   CO2 27 04/29/2020   GLUCOSE 94 04/29/2020   BUN 14 04/29/2020   CREATININE 0.96 04/29/2020   BILITOT 0.5 12/18/2019   ALKPHOS 65 12/18/2019   AST 23 05/05/2020   ALT 27 05/05/2020   PROT 6.8 12/18/2019   ALBUMIN 3.9 12/18/2019   CALCIUM 9.4 04/29/2020   GFRAA 92 04/29/2020    Speciality Comments: No specialty comments available.  Procedures:  No procedures performed Allergies: Ciprofloxacin and Quinolones   Assessment / Plan:     Visit Diagnoses: Idiopathic chronic gout of multiple sites without tophus - He has not had any recent gout flares.  He is clinically doing well on allopurinol 300 mg 1 tablet by mouth daily and colchicine 0.6 mg 1 tablet by mouth daily as needed during flares.  He has not needed to take colchicine since his last office visit.  He tries to avoid trigger foods/drinks (mussels and old european wine).  His   uric acid on  05/05/2020 was 3.8.  He is due to update uric acid level today.  Orders were provided to the patient to have drawn. He will continue taking allopurinol 300 mg 1 tablet by mouth daily and colchicine 0.6 mg 1 tablet by mouth daily as needed during flares.  He does not need any refills at this time.  He was advised to notify us if he develops signs or symptoms of a gout flare.  He will follow up in the office in 6 months.  - Plan: CBC with Differential/Platelet, Uric acid  Primary osteoarthritis of both hands: He has PIP and DIP thickening consistent with osteoarthritis of both hands.  No joint tenderness or inflammation noted.  He is able to make a complete fist bilaterally.    Primary osteoarthritis of both knees: He has good ROM of both knee joints with no discomfort.  No warmth or  effusion noted on exam.  He has been trying to increase his exercise regimen by walking 10,000+ steps daily.  He will be restarting physical therapy for lower extremity strengthening soon.   Primary osteoarthritis of both feet: He is not having any discomfort in his feet at this time.  He has good ROM of both ankle joints with no tenderness or inflammation.  Neuropathy: No new or worsening symptoms.  Trigger little finger of right hand - injected on 10/07/2019 and 08/31/2020. Resolved   Trigger finger, right ring finger - injected on 10/07/2019 and 08/31/2020. Resolved   Quadriceps tendon rupture, left, sequela - He had a left revision quadriceps tendon reconstruction on 07/02/19 performed by Dr. Elisabeth Most.  He recently had a left knee joint effusion after increasing his exercise regimen by trying to walk 10,000+ steps daily.  He had a cortisone injection which resolved the acute pain and inflammation he was experiencing.  He followed up with Dr. Elisabeth Most on 10/19/20 for further evaluation.  He updated x-rays which revealed moderate osteoarthritis with loss of medial joint space resultant varus deformity and tricompartmental osteophytes. He will be restarting physical therapy for the purpose of strengthening.    Rupture of quadriceps tendon, right, sequela: Doing well.  Repaired by Dr. Marlou Sa in the past. He has good ROM with no discomfort.   Other medical conditions are listed as follows:   Subdural hematoma ([facility])  H/O aortic valve replacement - Performed on 12/23/2019 by Dr. Burt Knack.  He continues to follow-up with Dr. Johnsie Cancel.  [place]  Orders: Orders Placed This Encounter  Procedures  . CBC with Differential/Platelet  . Uric acid  . CBC with Differential/Platelet  . Uric acid   No orders of the defined types were placed in this encounter.    Follow-Up Instructions: Return in about 6 months (around 04/19/2021) for Gout, Osteoarthritis.   Ofilia Neas, PA-C  Note - This record has been created  using Dragon software.  Chart creation errors have been sought, but may not always  have been located. Such creation errors do not reflect on  the standard of medical care.

## 2020-10-15 DIAGNOSIS — I350 Nonrheumatic aortic (valve) stenosis: Secondary | ICD-10-CM

## 2020-10-16 ENCOUNTER — Other Ambulatory Visit: Payer: Self-pay | Admitting: Rheumatology

## 2020-10-18 NOTE — Telephone Encounter (Signed)
Last Visit: 08/31/2020 Next Visit: 10/20/2020 Labs: 05/05/2020 LFTs WNL. CBC WNL. Uric acid is 3.8.   Current Dose per office note on 08/31/2020:  allopurinol 300 mg 1 tablet by mouth daily DX: Idiopathic chronic gout of multiple sites without tophus   Okay to refill allopurinol?

## 2020-10-20 ENCOUNTER — Encounter: Payer: Self-pay | Admitting: Physician Assistant

## 2020-10-20 ENCOUNTER — Other Ambulatory Visit: Payer: Self-pay

## 2020-10-20 ENCOUNTER — Ambulatory Visit (INDEPENDENT_AMBULATORY_CARE_PROVIDER_SITE_OTHER): Payer: Medicare Other | Admitting: Physician Assistant

## 2020-10-20 ENCOUNTER — Other Ambulatory Visit: Payer: Medicare Other | Admitting: *Deleted

## 2020-10-20 VITALS — BP 132/89 | HR 54 | Resp 15 | Ht 72.0 in | Wt 207.0 lb

## 2020-10-20 DIAGNOSIS — M65341 Trigger finger, right ring finger: Secondary | ICD-10-CM

## 2020-10-20 DIAGNOSIS — M170 Bilateral primary osteoarthritis of knee: Secondary | ICD-10-CM | POA: Diagnosis not present

## 2020-10-20 DIAGNOSIS — M19071 Primary osteoarthritis, right ankle and foot: Secondary | ICD-10-CM | POA: Diagnosis not present

## 2020-10-20 DIAGNOSIS — Z952 Presence of prosthetic heart valve: Secondary | ICD-10-CM

## 2020-10-20 DIAGNOSIS — G629 Polyneuropathy, unspecified: Secondary | ICD-10-CM

## 2020-10-20 DIAGNOSIS — A809 Acute poliomyelitis, unspecified: Secondary | ICD-10-CM

## 2020-10-20 DIAGNOSIS — S76112S Strain of left quadriceps muscle, fascia and tendon, sequela: Secondary | ICD-10-CM

## 2020-10-20 DIAGNOSIS — M19042 Primary osteoarthritis, left hand: Secondary | ICD-10-CM

## 2020-10-20 DIAGNOSIS — S065X9A Traumatic subdural hemorrhage with loss of consciousness of unspecified duration, initial encounter: Secondary | ICD-10-CM

## 2020-10-20 DIAGNOSIS — M19041 Primary osteoarthritis, right hand: Secondary | ICD-10-CM | POA: Diagnosis not present

## 2020-10-20 DIAGNOSIS — S76111S Strain of right quadriceps muscle, fascia and tendon, sequela: Secondary | ICD-10-CM

## 2020-10-20 DIAGNOSIS — E785 Hyperlipidemia, unspecified: Secondary | ICD-10-CM

## 2020-10-20 DIAGNOSIS — I350 Nonrheumatic aortic (valve) stenosis: Secondary | ICD-10-CM

## 2020-10-20 DIAGNOSIS — S065XAA Traumatic subdural hemorrhage with loss of consciousness status unknown, initial encounter: Secondary | ICD-10-CM

## 2020-10-20 DIAGNOSIS — M19072 Primary osteoarthritis, left ankle and foot: Secondary | ICD-10-CM

## 2020-10-20 DIAGNOSIS — M1A09X0 Idiopathic chronic gout, multiple sites, without tophus (tophi): Secondary | ICD-10-CM | POA: Diagnosis not present

## 2020-10-20 DIAGNOSIS — M65351 Trigger finger, right little finger: Secondary | ICD-10-CM

## 2020-10-20 LAB — HEPATIC FUNCTION PANEL
ALT: 32 IU/L (ref 0–44)
AST: 30 IU/L (ref 0–40)
Albumin: 4.4 g/dL (ref 3.7–4.7)
Alkaline Phosphatase: 82 IU/L (ref 44–121)
Bilirubin Total: 0.4 mg/dL (ref 0.0–1.2)
Bilirubin, Direct: 0.13 mg/dL (ref 0.00–0.40)
Total Protein: 6.5 g/dL (ref 6.0–8.5)

## 2020-10-20 LAB — LIPID PANEL
Chol/HDL Ratio: 2.2 ratio (ref 0.0–5.0)
Cholesterol, Total: 162 mg/dL (ref 100–199)
HDL: 73 mg/dL (ref >39)
LDL Chol Calc (NIH): 72 mg/dL (ref 0–99)
Triglycerides: 93 mg/dL (ref 0–149)
VLDL Cholesterol Cal: 17 mg/dL (ref 5–40)

## 2020-10-20 LAB — BASIC METABOLIC PANEL
BUN/Creatinine Ratio: 16 (ref 10–24)
BUN: 20 mg/dL (ref 8–27)
CO2: 26 mmol/L (ref 20–29)
Calcium: 9.4 mg/dL (ref 8.6–10.2)
Chloride: 102 mmol/L (ref 96–106)
Creatinine, Ser: 1.26 mg/dL (ref 0.76–1.27)
GFR calc Af Amer: 66 mL/min/{1.73_m2} (ref >59)
GFR calc non Af Amer: 57 mL/min/{1.73_m2} — ABNORMAL LOW (ref >59)
Glucose: 99 mg/dL (ref 65–99)
Potassium: 4.1 mmol/L (ref 3.5–5.2)
Sodium: 141 mmol/L (ref 134–144)

## 2020-10-21 LAB — CBC WITH DIFFERENTIAL/PLATELET
Absolute Monocytes: 664 cells/uL (ref 200–950)
Basophils Absolute: 74 cells/uL (ref 0–200)
Basophils Relative: 0.9 %
Eosinophils Absolute: 148 cells/uL (ref 15–500)
Eosinophils Relative: 1.8 %
HCT: 46.6 % (ref 38.5–50.0)
Hemoglobin: 15.5 g/dL (ref 13.2–17.1)
Lymphs Abs: 1451 cells/uL (ref 850–3900)
MCH: 31.3 pg (ref 27.0–33.0)
MCHC: 33.3 g/dL (ref 32.0–36.0)
MCV: 94 fL (ref 80.0–100.0)
MPV: 12.7 fL — ABNORMAL HIGH (ref 7.5–12.5)
Monocytes Relative: 8.1 %
Neutro Abs: 5863 cells/uL (ref 1500–7800)
Neutrophils Relative %: 71.5 %
Platelets: 181 10*3/uL (ref 140–400)
RBC: 4.96 10*6/uL (ref 4.20–5.80)
RDW: 13.1 % (ref 11.0–15.0)
Total Lymphocyte: 17.7 %
WBC: 8.2 10*3/uL (ref 3.8–10.8)

## 2020-10-21 LAB — URIC ACID: Uric Acid, Serum: 4 mg/dL (ref 4.0–8.0)

## 2020-10-21 NOTE — Progress Notes (Signed)
Uric acid is WNL-4.0.  CBC WNL.  Please advise the patient to continue taking allopurinol as prescribed.

## 2020-10-27 ENCOUNTER — Other Ambulatory Visit: Payer: Self-pay | Admitting: Cardiovascular Disease

## 2020-11-18 DIAGNOSIS — M1A09X0 Idiopathic chronic gout, multiple sites, without tophus (tophi): Secondary | ICD-10-CM

## 2020-11-18 MED ORDER — COLCHICINE 0.6 MG PO TABS: ORAL_TABLET | ORAL | 0 refills | Status: DC

## 2020-11-18 NOTE — Telephone Encounter (Signed)
Please notify the patient.

## 2020-11-18 NOTE — Telephone Encounter (Signed)
Last Visit: 10/20/2020 Next Visit: 04/20/2021 Labs: 10/20/2020 Uric acid is WNL-4.0. CBC WNL. GFR 57  Current Dose per office note 10/20/2020: colchicine 0.6 mg 1 tablet by mouth daily as needed during flares. DX: Idiopathic chronic gout of multiple sites without tophus   Okay to refill Colchicine?

## 2020-11-18 NOTE — Telephone Encounter (Signed)
Would mitigare be an option?  He has been receiving colchicine tablets since 2019. Please clarify what his insurance will prefer.

## 2020-11-19 ENCOUNTER — Telehealth: Payer: Self-pay | Admitting: Pharmacist

## 2020-11-19 DIAGNOSIS — M1A09X0 Idiopathic chronic gout, multiple sites, without tophus (tophi): Secondary | ICD-10-CM

## 2020-11-19 MED ORDER — COLCHICINE 0.6 MG PO CAPS: 1.0000 | ORAL_CAPSULE | Freq: Every day | ORAL | 0 refills | Status: AC | PRN

## 2020-11-19 MED ORDER — COLCHICINE 0.6 MG PO TABS: ORAL_TABLET | ORAL | 0 refills | Status: DC

## 2020-11-19 NOTE — Telephone Encounter (Signed)
Patient advised he has been approved for the gout grant. Patient advised he does not need to come to the office for samples. Patient advised his prescription has been sent to the pharmacy. Patient expressed understanding.

## 2020-11-19 NOTE — Addendum Note (Signed)
Addended by: Cassandria Anger on: 11/19/2020 02:02 PM   Modules accepted: Orders

## 2020-11-19 NOTE — Addendum Note (Signed)
Addended by: Carole Binning on: 11/19/2020 01:17 PM   Modules accepted: Orders

## 2020-11-19 NOTE — Telephone Encounter (Signed)
We can provide a 56-month supply of mitigare for the patient while waiting for grant approval.

## 2020-11-19 NOTE — Telephone Encounter (Addendum)
Enrolled patient into [employer] for [employer] copay assistance (must be Medicare-preferred MITIGARE)  Sent [name] MyChart message. He'll be getting letter in mail in 7-10 business days.  Grant details: $[id number] (Covered from 10/20/20 through 10/19/21). They are sending confirmation fax to clinic.  Pharmacy information: ID [id number] BIN [id number] GRP: [id number] PCN: [id number]  Pending prescription for Hazel Sams, PA-C, to resend with grant details.  Knox Saliva, PharmD, MPH Clinical Pharmacist (Rheumatology and Pulmonology)

## 2020-11-19 NOTE — Telephone Encounter (Signed)
Thank you so much Devki!   Seth Bake: Please notify the patient that I have sent the prescription to the pharmacy.  He will no longer need to pick up samples of colchicine from the office.

## 2020-12-20 DIAGNOSIS — M25562 Pain in left knee: Secondary | ICD-10-CM | POA: Diagnosis not present

## 2020-12-20 DIAGNOSIS — M6250 Muscle wasting and atrophy, not elsewhere classified, unspecified site: Secondary | ICD-10-CM | POA: Diagnosis not present

## 2020-12-20 DIAGNOSIS — F438 Other reactions to severe stress: Secondary | ICD-10-CM | POA: Diagnosis not present

## 2020-12-20 DIAGNOSIS — M1712 Unilateral primary osteoarthritis, left knee: Secondary | ICD-10-CM | POA: Diagnosis not present

## 2020-12-22 ENCOUNTER — Other Ambulatory Visit: Payer: Self-pay

## 2020-12-22 ENCOUNTER — Encounter: Payer: Self-pay | Admitting: Physician Assistant

## 2020-12-22 ENCOUNTER — Ambulatory Visit (HOSPITAL_COMMUNITY): Payer: Medicare Other | Attending: Cardiovascular Disease

## 2020-12-22 ENCOUNTER — Ambulatory Visit (INDEPENDENT_AMBULATORY_CARE_PROVIDER_SITE_OTHER): Payer: Medicare Other | Admitting: Physician Assistant

## 2020-12-22 VITALS — BP 142/92 | HR 71 | Ht 72.0 in | Wt 204.8 lb

## 2020-12-22 DIAGNOSIS — Z952 Presence of prosthetic heart valve: Secondary | ICD-10-CM | POA: Insufficient documentation

## 2020-12-22 DIAGNOSIS — I7121 Aneurysm of the ascending aorta, without rupture: Secondary | ICD-10-CM

## 2020-12-22 DIAGNOSIS — I10 Essential (primary) hypertension: Secondary | ICD-10-CM

## 2020-12-22 DIAGNOSIS — I350 Nonrheumatic aortic (valve) stenosis: Secondary | ICD-10-CM | POA: Insufficient documentation

## 2020-12-22 DIAGNOSIS — I2510 Atherosclerotic heart disease of native coronary artery without angina pectoris: Secondary | ICD-10-CM | POA: Diagnosis not present

## 2020-12-22 DIAGNOSIS — I712 Thoracic aortic aneurysm, without rupture: Secondary | ICD-10-CM | POA: Diagnosis not present

## 2020-12-22 LAB — ECHOCARDIOGRAM COMPLETE
AV Mean grad: 14 mmHg
AV Peak grad: 26.2 mmHg
Ao pk vel: 2.56 m/s
Area-P 1/2: 3.33 cm2
P 1/2 time: 833 msec
S' Lateral: 2.6 cm

## 2020-12-22 MED ORDER — METOPROLOL SUCCINATE ER 50 MG PO TB24
50.0000 mg | ORAL_TABLET | Freq: Every day | ORAL | 3 refills | Status: DC
Start: 2020-12-22 — End: 2022-03-15

## 2020-12-22 NOTE — Progress Notes (Signed)
[facility] [place]                                     Cardiology Office Note:    Date:  12/22/2020   ID:  Jose [name], DOB 10-Oct-1951, MRN [id number]  PCP:  [name], MD  [facility] Cardiologist:  [name], MD / Dr. Burt Knack & Dr. Cyndia Bent (TAVR) [facility] Electrophysiologist:  None   Referring MD: [name], MD   CC: 1 year s/p TAVR  History of Present Illness:    Jose [name] is a 70 y.o. 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  The patient has been followed for a dilated ascending aorta now for several years. He has a known bicuspid aortic valve and has developed progressive aortic stenosis. Cath 11/08/19 showed moderate eccentric proxima/mid LAD stenosis, severe stenosis of a moderate sized first diagonal branch, not suitable for PCI, patent left main, LCx, and RCA with mild nonobstructive plaque.  The patient was evaluated by the multidisciplinary valve team and underwent successful TAVR with a 26 mm Edwards Sapien 3 Ultra THV via the TF approach on 12/23/19. Post op echo showed normal LV function, normally functioning TAVR with a mean gradient of 11 mmHg and no PVL. He was discharged on aspirin and plavix. Later we discontinued plavix given history of intracranial bleed. 1 month echo showed EF 55%, normally functioning TAVR with a mean gradient of 14 mm hg and no PVL.   Today he presents to clinic for follow up. He is doing quite well . He exercises 3-5 x a week and stays very active. Trying to lose weight but difficult. Has lost about 4 lbs. No CP or SOB. No LE edema, orthopnea or PND. No dizziness or syncope. No blood in stool or urine. No palpitations. Does have back pain which he  thinks elevates his BP.   Past Medical History:  Diagnosis Date  . Arthritis    R shoulder, great toes, hands- has gout & has injections in knees with Dr. Estanislado Pandy   . Bicuspid aortic valve with ascending aorta 4.0 to 4.5 cm in diameter   . Cancer ([employer])    skin ca-   . GERD (gastroesophageal reflux disease)   . Headache   . History of hiatal hernia   . Hypertension   . SDH (subdural hematoma) ([place])   . Severe aortic stenosis   . Vertebral artery aneurysm [facility])     Past Surgical History:  Procedure Laterality Date  . BRAIN SURGERY  2016   evacuation of SDH  . CARDIAC CATHETERIZATION    . CARPAL TUNNEL RELEASE Bilateral   . GREEN LIGHT LASER TURP (TRANSURETHRAL RESECTION OF PROSTATE  04/27/2017  . KNEE ARTHROPLASTY    . LEFT HEART CATH AND CORONARY ANGIOGRAPHY N/A 10/08/2019   Procedure: LEFT HEART CATH AND CORONARY ANGIOGRAPHY;  Surgeon: Sherren Mocha, MD;  Location: [place] [facility];  Service: Cardiovascular;  Laterality: N/A;  . QUADRICEPS TENDON REPAIR Bilateral 06/15/2017   Procedure: REPAIR QUADRICEP TENDON;  Surgeon: Meredith Pel, MD;  Location: [place];  Service: Orthopedics;  Laterality: Bilateral;  . SHOULDER SURGERY Right   .  TRANSCATHETER AORTIC VALVE REPLACEMENT, TRANSFEMORAL  12/23/2019  . TRANSCATHETER AORTIC VALVE REPLACEMENT, TRANSFEMORAL N/A 12/23/2019   Procedure: TRANSCATHETER AORTIC VALVE REPLACEMENT, TRANSFEMORAL;  Surgeon: Tonny Bollman, MD;  Location: [facility] OR;  Service: Open Heart Surgery;  Laterality: N/A;  . VASCULAR SURGERY      Current Medications: Current Meds  Medication Sig  . allopurinol (ZYLOPRIM) 300 MG tablet TAKE 1 TABLET BY MOUTH EVERY DAY  . amoxicillin (AMOXIL) 500 MG tablet Take 4 capsules (2,000 mg) 1 hour prior to all dental visits.  Marland Kitchen aspirin 81 MG chewable tablet Chew 1 tablet (81 mg total) by mouth daily.  Marland Kitchen atorvastatin (LIPITOR) 20 MG tablet TAKE 1 TABLET (20 MG TOTAL) BY MOUTH DAILY AT 6 PM.  . Colchicine (MITIGARE) 0.6  MG CAPS Take 1 capsule by mouth daily as needed (for gout flares). *Please note grant information for Medicare and Mitigare copay assistance*  . esomeprazole (NEXIUM) 20 MG capsule Take 20 mg by mouth daily at 12 noon.  . metoprolol succinate (TOPROL-XL) 50 MG 24 hr tablet Take 1 tablet (50 mg total) by mouth daily. Take with or immediately following a meal.  . Multiple Vitamin (MULTIVITAMIN WITH MINERALS) TABS tablet Take 1 tablet by mouth daily.  . traMADol (ULTRAM) 50 MG tablet Take 1-2 tablets (50-100 mg total) by mouth every 4 (four) hours as needed for moderate pain.  . [DISCONTINUED] clopidogrel (PLAVIX) 75 MG tablet      Allergies:   Ciprofloxacin and Quinolones   Social History   Socioeconomic History  . Marital status: Divorced    Spouse name: Not on file  . Number of children: Not on file  . Years of education: Not on file  . Highest education level: Not on file  Occupational History  . Not on file  Tobacco Use  . Smoking status: Never Smoker  . Smokeless tobacco: Never Used  Vaping Use  . Vaping Use: Never used  Substance and Sexual Activity  . Alcohol use: Yes    Alcohol/week: 8.0 standard drinks    Types: 7 Glasses of wine, 1 Shots of liquor per week  . Drug use: No  . Sexual activity: Not on file  Other Topics Concern  . Not on file  Social History Narrative  . Not on file   Social Determinants of Health   Financial Resource Strain: Not on file  Food Insecurity: Not on file  Transportation Needs: Not on file  Physical Activity: Not on file  Stress: Not on file  Social Connections: Not on file     Family History: The patient's family history includes Diabetes in his brother; Heart disease in his father; Parkinson's disease in his brother and mother.  ROS:   Please see the history of present illness.    All other systems reviewed and are negative.  EKGs/Labs/Other Studies Reviewed:    The following studies were reviewed today: TAVR OPERATIVE  NOTE   Date of Procedure:12/23/2019  Preoperative Diagnosis:Severe Aortic Stenosis   Postoperative Diagnosis:Same   Procedure:   Transcatheter Aortic Valve Replacement - Percutaneous Transfemoral Approach Edwards Sapien 3 UltraTHV (size6mm, model # [id number], serial #[id number])  Co-Surgeons:Bryan Jennefer Bravo, MD and Tonny Bollman, MD  Anesthesiologist:James Krista Blue, MD  Echocardiographer:Peter Eden Emms, MD  Pre-operative Echo Findings: ? Severebicuspidaortic stenosis ? Normalleft ventricular systolic function  Post-operative Echo Findings: ? Noparavalvular leak ? Normalleft ventricular systolic function  ______________  12/24/19 ECHO IMPRESSIONS 1. Left ventricular ejection fraction, by visual estimation, is 60 to 65%. The left ventricle has  normal function. There is mildly increased left ventricular hypertrophy. 2. Left ventricular diastolic parameters are consistent with Grade I diastolic dysfunction (impaired relaxation). 3. The left ventricle has no regional wall motion abnormalities. 4. Global right ventricle has normal systolic function.The right ventricular size is normal. No increase in right ventricular wall thickness. 5. Left atrial size was normal. 6. Right atrial size was normal. 7. The mitral valve is normal in structure. No evidence of mitral valve regurgitation. No evidence of mitral stenosis. 8. The tricuspid valve is normal in structure. 9. Aortic valve regurgitation is not visualized. No evidence of aortic valve sclerosis or stenosis. 10. The pulmonic valve was normal in structure. Pulmonic valve regurgitation is not visualized. 11. There is moderate dilatation of the ascending aorta measuring 45 mm. 12. The inferior vena cava is normal in size with greater than 50% respiratory variability, suggesting right atrial pressure of 3  mmHg. 13. S/P TAVR with a 26 mm Edwards-SAPIEN 3 Ultra valve, peak/mean gradients 17/11 mmHg. No paravalvular leak.  Aortic Valve: The aortic valve has been repaired/replaced. Aortic valve regurgitation is not visualized. The aortic valve is structurally normal, with no evidence of sclerosis or stenosis. Aortic valve mean gradient measures 10.5 mmHg. Aortic valve peak  gradient measures 17.0 mmHg. Aortic valve area, by VTI measures 3.38 cm. 26 Edwards Sapien bioprosthetic, stented aortic valve (TAVR) valve is present in the aortic position.  __________________  Echo 01/15/20 IMPRESSIONS  1. Left ventricular ejection fraction, by visual estimation, is 55 to 60%. The left ventricle has normal function. There is mildly increased left ventricular hypertrophy.  2. Left ventricular diastolic parameters are consistent with Grade I diastolic dysfunction (impaired relaxation).  3. The left ventricle has no regional wall motion abnormalities.  4. Global right ventricle has normal systolic function.The right ventricular size is normal. No increase in right ventricular wall thickness.  5. Left atrial size was mild-moderately dilated.  6. Right atrial size was normal.  7. The mitral valve is normal in structure. Trivial mitral valve regurgitation.  8. The tricuspid valve is normal in structure.  9. Aortic stent-valve (TAVR) prosthesis is well seated, without perivalvular leak.  10. Aortic valve mean gradient measures 13.7 mmHg.  11. Aortic valve peak gradient measures 24.4 mmHg.  12. Aortic valve regurgitation is not visualized.  13. The pulmonic valve was not well visualized. Pulmonic valve regurgitation is not visualized.  14. Aortic dilatation noted.  15. There is mild to moderate dilatation of the ascending aorta measuring  44 mm.  16. No significant change from prior study.  17. Prior images reviewed side by side.  18. When compared to the 12/24/2019 study, the TAVR gradients are very  slightly higher. The changes in TAVR dimensionless index and calculated valve area are due to differences in LVOT pulsed wave sampling technique, rather than a true change in valve hemodynamics.   _______________  CT ANGIOGRAPHY CHEST WITH CONTRAST 05/06/20 TECHNIQUE: Multidetector CT imaging of the chest was performed using the standard protocol during bolus administration of intravenous contrast. Multiplanar CT image reconstructions and MIPs were obtained to evaluate the vascular anatomy.  CONTRAST:  177mL OMNIPAQUE IOHEXOL 350 MG/ML SOLN  COMPARISON:  11/03/2019  FINDINGS: Cardiovascular: Unchanged ascending aortic aneurysm with maximal diameter 4.4 cm. Interval TAVR. Mild cardiomegaly. No pericardial effusion. Normal caliber pulmonary arteries without central pulmonary emboli identified on this nondedicated study.  Mediastinum/Nodes: No enlarged axillary, mediastinal, or hilar lymph nodes. Unremarkable thyroid and esophagus.  Lungs/Pleura: No pleural effusion or pneumothorax. Mild respiratory  motion artifact. No lung consolidation or mass.  Upper Abdomen: Subcentimeter hypodense lesions in the left kidney, similar to the prior study and too small to fully characterize. Small duodenal diverticulum.  Musculoskeletal: No acute osseous abnormality or suspicious osseous lesion. Moderate thoracic disc degeneration.  Review of the MIP images confirms the above findings.  IMPRESSION: 1. Unchanged 4.4 cm ascending aortic aneurysm. 2. Interval TAVR.  __________________  Echo 12/22/20 IMPRESSIONS 1. Left ventricular ejection fraction, by estimation, is 60 to 65%. The  left ventricle has normal function. The left ventricle has no regional  wall motion abnormalities. There is mild left ventricular hypertrophy.  Left ventricular diastolic parameters  are consistent with Grade I diastolic dysfunction (impaired relaxation).  2. Right ventricular systolic function is  normal. The right ventricular  size is mildly enlarged.  3. The mitral valve is normal in structure. Trivial mitral valve  regurgitation. No evidence of mitral stenosis.  4. Mild TAVR valve regurgitation. The aortic valve has been  repaired/replaced. Aortic valve regurgitation is mild. No aortic stenosis  is present. There is a 26 mm Sapien prosthetic (TAVR) valve present in the  aortic position. Procedure Date: 12/23/19.  Aortic regurgitation PHT measures 833 msec. Aortic valve mean gradient  measures 14.0 mmHg. Aortic valve Vmax measures 2.56 m/s.  5. Aortic dilatation noted. There is moderate dilatation of the ascending  aorta, measuring 46 mm.  6. The inferior vena cava is normal in size with greater than 50%  respiratory variability, suggesting right atrial pressure of 3 mmHg.   Comparison(s): When compared to prior, there is a mild aortic  regurgitation signal.    EKG:  EKG is NOT ordered today.    Recent Labs: 12/24/2019: Magnesium 1.6 10/20/2020: ALT 32; BUN 20; Creatinine, Ser 1.26; Hemoglobin 15.5; Platelets 181; Potassium 4.1; Sodium 141  Recent Lipid Panel    Component Value Date/Time   CHOL 162 10/20/2020 0832   TRIG 93 10/20/2020 0832   HDL 73 10/20/2020 0832   CHOLHDL 2.2 10/20/2020 0832   CHOLHDL 3.7 CALC 12/25/2007 1636   VLDL 25 12/25/2007 1636   LDLCALC 72 10/20/2020 0832   LDLDIRECT 164.4 12/25/2007 1636   Risk Assessment/Calculations:       Physical Exam:    VS:  BP (!) 142/92   Pulse 71   Ht 6' (1.829 m)   Wt 204 lb 12.8 oz (92.9 kg)   SpO2 97%   BMI 27.78 kg/m     Wt Readings from Last 3 Encounters:  12/22/20 204 lb 12.8 oz (92.9 kg)  10/20/20 207 lb (93.9 kg)  08/31/20 209 lb 12.8 oz (95.2 kg)     GEN:  Well nourished, well developed in no acute distress, mildly overweight HEENT: Normal NECK: No JVD; No carotid bruits LYMPHATICS: No lymphadenopathy CARDIAC:  RRR, no murmurs, rubs, gallops RESPIRATORY:  Clear to auscultation without  rales, wheezing or rhonchi  ABDOMEN: Soft, non-tender, non-distended MUSCULOSKELETAL:  No edema; No deformity  SKIN: Warm and dry NEUROLOGIC:  Alert and oriented x 3 PSYCHIATRIC:  Normal affect   ASSESSMENT:    1. S/P TAVR (transcatheter aortic valve replacement)   2. Hypertension, unspecified type   3. Ascending aortic aneurysm ([place])   4. Coronary artery disease involving native heart without angina pectoris, unspecified vessel or lesion type    PLAN:    In order of problems listed above:  1.S/p TAVR: echo today shows EF 55%, normally functioning TAVR with a mean gradient of 14 mm and mild AI (new  from previous echos). Will repeat echo in 6 months. He has NYHA class I symptoms. SBE prophylaxis discussed; he has amoxicillin. Continue on aspirin alone. Continue regular follow up with Dr. Johnsie Cancel  2. HTN: BP elevated today and remained elevated on my personal recheck. Given TAA, will be more aggressive with his BP control. He has been on Toprol XL 25mg  daily. We will plan to double this to 50mg  daily. He will follow BP at home and message me a log.   3. TAA: CT scan 05/05/20 showed stable TAA at 4.4cm. Repeat study recommended in 1 year. Due 04/2021. I will have this set up.   4. CAD: Cath prior to TAVR 10/08/19 with 75% small D1 and 50% mid LAD. No chest pain. Continue medical therapy with ASA, statin and BB.      Medication Adjustments/Labs and Tests Ordered: Current medicines are reviewed at length with the patient today.  Concerns regarding medicines are outlined above.  Orders Placed This Encounter  Procedures  . Basic metabolic panel   Meds ordered this encounter  Medications  . metoprolol succinate (TOPROL-XL) 50 MG 24 hr tablet    Sig: Take 1 tablet (50 mg total) by mouth daily. Take with or immediately following a meal.    Dispense:  90 tablet    Refill:  3    Patient Instructions  Medication Instructions:  Your physician has recommended you make the following change  in your medication:  1.) increase Toprol XL to 50 mg daily Plavix was removed from your med list because you are not taking this medicine.   Keep track of blood pressures, send MyChart message to Dr. Johnsie Cancel with list of readings in a few weeks.   *If you need a refill on your cardiac medications before your next appointment, please call your pharmacy*   Lab Work: None today BMET --due prior to CTA in May 2022.  Testing/Procedures:--CTA due in May 2022.   Non-Cardiac CT Angiography (CTA), is a special type of CT scan that uses a computer to produce multi-dimensional views of major blood vessels throughout the body. In CT angiography, a contrast material is injected through an IV to help visualize the blood vessels   Follow-Up: At [facility], you and your health needs are our priority.  As part of our continuing mission to provide you with exceptional heart care, we have created designated Provider Care Teams.  These Care Teams include your primary Cardiologist (physician) and Advanced Practice Providers (APPs -  Physician Assistants and Nurse Practitioners) who all work together to provide you with the care you need, when you need it.   Your next appointment:   6 month(s)  The format for your next appointment:   In Person  Provider:   You may see [name], MD or one of the following Advanced Practice Providers on your designated Care Team:    Melina Copa, PA-C  Ermalinda Barrios, PA-C    Other Instructions      Signed, Angelena Form, PA-C  12/22/2020 2:40 PM    [place]

## 2020-12-22 NOTE — Patient Instructions (Addendum)
Medication Instructions:  Your physician has recommended you make the following change in your medication:  1.) increase Toprol XL to 50 mg daily Plavix was removed from your med list because you are not taking this medicine.   Keep track of blood pressures, send MyChart message to Dr. Eden Emms with list of readings in a few weeks.   *If you need a refill on your cardiac medications before your next appointment, please call your pharmacy*   Lab Work: None today BMET --due prior to CTA in May 2022.  Testing/Procedures:--CTA due in May 2022.   Non-Cardiac CT Angiography (CTA), is a special type of CT scan that uses a computer to produce multi-dimensional views of major blood vessels throughout the body. In CT angiography, a contrast material is injected through an IV to help visualize the blood vessels   Follow-Up: At [facility], you and your health needs are our priority.  As part of our continuing mission to provide you with exceptional heart care, we have created designated Provider Care Teams.  These Care Teams include your primary Cardiologist (physician) and Advanced Practice Providers (APPs -  Physician Assistants and Nurse Practitioners) who all work together to provide you with the care you need, when you need it.   Your next appointment:   6 month(s)  The format for your next appointment:   In Person  Provider:   You may see Charlton Haws, MD or one of the following Advanced Practice Providers on your designated Care Team:    Ronie Spies, PA-C  Jacolyn Reedy, PA-C    Other Instructions

## 2020-12-24 ENCOUNTER — Other Ambulatory Visit: Payer: Self-pay

## 2020-12-24 ENCOUNTER — Encounter: Payer: Self-pay | Admitting: Cardiothoracic Surgery

## 2020-12-24 ENCOUNTER — Encounter: Payer: Self-pay | Admitting: Rheumatology

## 2020-12-24 DIAGNOSIS — I359 Nonrheumatic aortic valve disorder, unspecified: Secondary | ICD-10-CM

## 2020-12-24 DIAGNOSIS — I350 Nonrheumatic aortic (valve) stenosis: Secondary | ICD-10-CM

## 2020-12-24 DIAGNOSIS — Z952 Presence of prosthetic heart valve: Secondary | ICD-10-CM

## 2020-12-30 DIAGNOSIS — F438 Other reactions to severe stress: Secondary | ICD-10-CM | POA: Diagnosis not present

## 2021-01-12 DIAGNOSIS — F438 Other reactions to severe stress: Secondary | ICD-10-CM | POA: Diagnosis not present

## 2021-01-17 ENCOUNTER — Other Ambulatory Visit: Payer: Self-pay | Admitting: Rheumatology

## 2021-01-17 NOTE — Telephone Encounter (Signed)
Last Visit: 10/20/2020 Next Visit: 05/05/2021 Labs: 10/20/2020, Uric acid is WNL-4.0. CBC WNL.  Please advise the patient to continue taking allopurinol as prescribed.  Current Dose per office note 10/20/2020, allopurinol 300 mg 1 tablet by mouth daily  DX: Idiopathic chronic gout of multiple sites without tophus   Okay to refill Allopurinol?

## 2021-01-19 DIAGNOSIS — M6250 Muscle wasting and atrophy, not elsewhere classified, unspecified site: Secondary | ICD-10-CM | POA: Diagnosis not present

## 2021-01-19 DIAGNOSIS — M545 Low back pain, unspecified: Secondary | ICD-10-CM | POA: Diagnosis not present

## 2021-01-19 DIAGNOSIS — M5135 Other intervertebral disc degeneration, thoracolumbar region: Secondary | ICD-10-CM | POA: Diagnosis not present

## 2021-01-25 DIAGNOSIS — M6250 Muscle wasting and atrophy, not elsewhere classified, unspecified site: Secondary | ICD-10-CM | POA: Diagnosis not present

## 2021-01-25 DIAGNOSIS — M5135 Other intervertebral disc degeneration, thoracolumbar region: Secondary | ICD-10-CM | POA: Diagnosis not present

## 2021-01-25 DIAGNOSIS — M545 Low back pain, unspecified: Secondary | ICD-10-CM | POA: Diagnosis not present

## 2021-01-26 DIAGNOSIS — F438 Other reactions to severe stress: Secondary | ICD-10-CM | POA: Diagnosis not present

## 2021-01-31 ENCOUNTER — Other Ambulatory Visit: Payer: Self-pay | Admitting: Physician Assistant

## 2021-02-02 DIAGNOSIS — M545 Low back pain, unspecified: Secondary | ICD-10-CM | POA: Diagnosis not present

## 2021-02-02 DIAGNOSIS — M6250 Muscle wasting and atrophy, not elsewhere classified, unspecified site: Secondary | ICD-10-CM | POA: Diagnosis not present

## 2021-02-02 DIAGNOSIS — M5135 Other intervertebral disc degeneration, thoracolumbar region: Secondary | ICD-10-CM | POA: Diagnosis not present

## 2021-02-03 DIAGNOSIS — F438 Other reactions to severe stress: Secondary | ICD-10-CM | POA: Diagnosis not present

## 2021-02-07 DIAGNOSIS — M5135 Other intervertebral disc degeneration, thoracolumbar region: Secondary | ICD-10-CM | POA: Diagnosis not present

## 2021-02-07 DIAGNOSIS — M545 Low back pain, unspecified: Secondary | ICD-10-CM | POA: Diagnosis not present

## 2021-02-07 DIAGNOSIS — M6250 Muscle wasting and atrophy, not elsewhere classified, unspecified site: Secondary | ICD-10-CM | POA: Diagnosis not present

## 2021-02-09 DIAGNOSIS — M6250 Muscle wasting and atrophy, not elsewhere classified, unspecified site: Secondary | ICD-10-CM | POA: Diagnosis not present

## 2021-02-09 DIAGNOSIS — M545 Low back pain, unspecified: Secondary | ICD-10-CM | POA: Diagnosis not present

## 2021-02-09 DIAGNOSIS — M5135 Other intervertebral disc degeneration, thoracolumbar region: Secondary | ICD-10-CM | POA: Diagnosis not present

## 2021-02-11 DIAGNOSIS — F438 Other reactions to severe stress: Secondary | ICD-10-CM | POA: Diagnosis not present

## 2021-02-14 DIAGNOSIS — M6250 Muscle wasting and atrophy, not elsewhere classified, unspecified site: Secondary | ICD-10-CM | POA: Diagnosis not present

## 2021-02-14 DIAGNOSIS — M545 Low back pain, unspecified: Secondary | ICD-10-CM | POA: Diagnosis not present

## 2021-02-14 DIAGNOSIS — F438 Other reactions to severe stress: Secondary | ICD-10-CM | POA: Diagnosis not present

## 2021-02-14 DIAGNOSIS — M5135 Other intervertebral disc degeneration, thoracolumbar region: Secondary | ICD-10-CM | POA: Diagnosis not present

## 2021-02-16 DIAGNOSIS — M6250 Muscle wasting and atrophy, not elsewhere classified, unspecified site: Secondary | ICD-10-CM | POA: Diagnosis not present

## 2021-02-16 DIAGNOSIS — M545 Low back pain, unspecified: Secondary | ICD-10-CM | POA: Diagnosis not present

## 2021-02-16 DIAGNOSIS — M5135 Other intervertebral disc degeneration, thoracolumbar region: Secondary | ICD-10-CM | POA: Diagnosis not present

## 2021-02-17 DIAGNOSIS — L538 Other specified erythematous conditions: Secondary | ICD-10-CM | POA: Diagnosis not present

## 2021-02-17 DIAGNOSIS — L570 Actinic keratosis: Secondary | ICD-10-CM | POA: Diagnosis not present

## 2021-02-17 DIAGNOSIS — D235 Other benign neoplasm of skin of trunk: Secondary | ICD-10-CM | POA: Diagnosis not present

## 2021-02-17 DIAGNOSIS — D234 Other benign neoplasm of skin of scalp and neck: Secondary | ICD-10-CM | POA: Diagnosis not present

## 2021-02-17 DIAGNOSIS — D485 Neoplasm of uncertain behavior of skin: Secondary | ICD-10-CM | POA: Diagnosis not present

## 2021-02-17 DIAGNOSIS — L820 Inflamed seborrheic keratosis: Secondary | ICD-10-CM | POA: Diagnosis not present

## 2021-02-17 DIAGNOSIS — D2362 Other benign neoplasm of skin of left upper limb, including shoulder: Secondary | ICD-10-CM | POA: Diagnosis not present

## 2021-02-17 DIAGNOSIS — X32XXXA Exposure to sunlight, initial encounter: Secondary | ICD-10-CM | POA: Diagnosis not present

## 2021-02-17 DIAGNOSIS — D2361 Other benign neoplasm of skin of right upper limb, including shoulder: Secondary | ICD-10-CM | POA: Diagnosis not present

## 2021-02-18 DIAGNOSIS — E781 Pure hyperglyceridemia: Secondary | ICD-10-CM | POA: Diagnosis not present

## 2021-02-18 DIAGNOSIS — Z1389 Encounter for screening for other disorder: Secondary | ICD-10-CM | POA: Diagnosis not present

## 2021-02-18 DIAGNOSIS — I671 Cerebral aneurysm, nonruptured: Secondary | ICD-10-CM | POA: Diagnosis not present

## 2021-02-18 DIAGNOSIS — Z0000 Encounter for general adult medical examination without abnormal findings: Secondary | ICD-10-CM | POA: Diagnosis not present

## 2021-02-18 DIAGNOSIS — M109 Gout, unspecified: Secondary | ICD-10-CM | POA: Diagnosis not present

## 2021-02-18 DIAGNOSIS — R7303 Prediabetes: Secondary | ICD-10-CM | POA: Diagnosis not present

## 2021-02-18 DIAGNOSIS — N400 Enlarged prostate without lower urinary tract symptoms: Secondary | ICD-10-CM | POA: Diagnosis not present

## 2021-02-18 DIAGNOSIS — I726 Aneurysm of vertebral artery: Secondary | ICD-10-CM | POA: Diagnosis not present

## 2021-02-18 DIAGNOSIS — Z1211 Encounter for screening for malignant neoplasm of colon: Secondary | ICD-10-CM | POA: Diagnosis not present

## 2021-02-18 DIAGNOSIS — S065X0A Traumatic subdural hemorrhage without loss of consciousness, initial encounter: Secondary | ICD-10-CM | POA: Diagnosis not present

## 2021-02-18 DIAGNOSIS — M5416 Radiculopathy, lumbar region: Secondary | ICD-10-CM | POA: Diagnosis not present

## 2021-02-18 DIAGNOSIS — K219 Gastro-esophageal reflux disease without esophagitis: Secondary | ICD-10-CM | POA: Diagnosis not present

## 2021-02-21 DIAGNOSIS — M6250 Muscle wasting and atrophy, not elsewhere classified, unspecified site: Secondary | ICD-10-CM | POA: Diagnosis not present

## 2021-02-21 DIAGNOSIS — M5135 Other intervertebral disc degeneration, thoracolumbar region: Secondary | ICD-10-CM | POA: Diagnosis not present

## 2021-02-21 DIAGNOSIS — M545 Low back pain, unspecified: Secondary | ICD-10-CM | POA: Diagnosis not present

## 2021-02-23 DIAGNOSIS — M6250 Muscle wasting and atrophy, not elsewhere classified, unspecified site: Secondary | ICD-10-CM | POA: Diagnosis not present

## 2021-02-23 DIAGNOSIS — M545 Low back pain, unspecified: Secondary | ICD-10-CM | POA: Diagnosis not present

## 2021-02-23 DIAGNOSIS — M5135 Other intervertebral disc degeneration, thoracolumbar region: Secondary | ICD-10-CM | POA: Diagnosis not present

## 2021-02-28 DIAGNOSIS — M545 Low back pain, unspecified: Secondary | ICD-10-CM | POA: Diagnosis not present

## 2021-02-28 DIAGNOSIS — M5135 Other intervertebral disc degeneration, thoracolumbar region: Secondary | ICD-10-CM | POA: Diagnosis not present

## 2021-02-28 DIAGNOSIS — M6250 Muscle wasting and atrophy, not elsewhere classified, unspecified site: Secondary | ICD-10-CM | POA: Diagnosis not present

## 2021-03-02 DIAGNOSIS — M5135 Other intervertebral disc degeneration, thoracolumbar region: Secondary | ICD-10-CM | POA: Diagnosis not present

## 2021-03-02 DIAGNOSIS — M6250 Muscle wasting and atrophy, not elsewhere classified, unspecified site: Secondary | ICD-10-CM | POA: Diagnosis not present

## 2021-03-02 DIAGNOSIS — M545 Low back pain, unspecified: Secondary | ICD-10-CM | POA: Diagnosis not present

## 2021-03-07 DIAGNOSIS — M6250 Muscle wasting and atrophy, not elsewhere classified, unspecified site: Secondary | ICD-10-CM | POA: Diagnosis not present

## 2021-03-07 DIAGNOSIS — M5135 Other intervertebral disc degeneration, thoracolumbar region: Secondary | ICD-10-CM | POA: Diagnosis not present

## 2021-03-07 DIAGNOSIS — M545 Low back pain, unspecified: Secondary | ICD-10-CM | POA: Diagnosis not present

## 2021-03-09 DIAGNOSIS — M545 Low back pain, unspecified: Secondary | ICD-10-CM | POA: Diagnosis not present

## 2021-03-09 DIAGNOSIS — M5135 Other intervertebral disc degeneration, thoracolumbar region: Secondary | ICD-10-CM | POA: Diagnosis not present

## 2021-03-09 DIAGNOSIS — M6250 Muscle wasting and atrophy, not elsewhere classified, unspecified site: Secondary | ICD-10-CM | POA: Diagnosis not present

## 2021-03-14 DIAGNOSIS — F438 Other reactions to severe stress: Secondary | ICD-10-CM | POA: Diagnosis not present

## 2021-03-17 DIAGNOSIS — M545 Low back pain, unspecified: Secondary | ICD-10-CM | POA: Diagnosis not present

## 2021-03-17 DIAGNOSIS — M5135 Other intervertebral disc degeneration, thoracolumbar region: Secondary | ICD-10-CM | POA: Diagnosis not present

## 2021-03-17 DIAGNOSIS — M6250 Muscle wasting and atrophy, not elsewhere classified, unspecified site: Secondary | ICD-10-CM | POA: Diagnosis not present

## 2021-03-18 DIAGNOSIS — M6250 Muscle wasting and atrophy, not elsewhere classified, unspecified site: Secondary | ICD-10-CM | POA: Diagnosis not present

## 2021-03-18 DIAGNOSIS — M545 Low back pain, unspecified: Secondary | ICD-10-CM | POA: Diagnosis not present

## 2021-03-18 DIAGNOSIS — M5135 Other intervertebral disc degeneration, thoracolumbar region: Secondary | ICD-10-CM | POA: Diagnosis not present

## 2021-03-21 DIAGNOSIS — M6250 Muscle wasting and atrophy, not elsewhere classified, unspecified site: Secondary | ICD-10-CM | POA: Diagnosis not present

## 2021-03-21 DIAGNOSIS — M545 Low back pain, unspecified: Secondary | ICD-10-CM | POA: Diagnosis not present

## 2021-03-21 DIAGNOSIS — M5135 Other intervertebral disc degeneration, thoracolumbar region: Secondary | ICD-10-CM | POA: Diagnosis not present

## 2021-03-22 DIAGNOSIS — F438 Other reactions to severe stress: Secondary | ICD-10-CM | POA: Diagnosis not present

## 2021-03-25 DIAGNOSIS — M545 Low back pain, unspecified: Secondary | ICD-10-CM | POA: Diagnosis not present

## 2021-03-25 DIAGNOSIS — M6250 Muscle wasting and atrophy, not elsewhere classified, unspecified site: Secondary | ICD-10-CM | POA: Diagnosis not present

## 2021-03-25 DIAGNOSIS — M5135 Other intervertebral disc degeneration, thoracolumbar region: Secondary | ICD-10-CM | POA: Diagnosis not present

## 2021-03-30 DIAGNOSIS — M6250 Muscle wasting and atrophy, not elsewhere classified, unspecified site: Secondary | ICD-10-CM | POA: Diagnosis not present

## 2021-03-30 DIAGNOSIS — M545 Low back pain, unspecified: Secondary | ICD-10-CM | POA: Diagnosis not present

## 2021-03-30 DIAGNOSIS — M5135 Other intervertebral disc degeneration, thoracolumbar region: Secondary | ICD-10-CM | POA: Diagnosis not present

## 2021-03-31 DIAGNOSIS — F438 Other reactions to severe stress: Secondary | ICD-10-CM | POA: Diagnosis not present

## 2021-04-05 DIAGNOSIS — M6250 Muscle wasting and atrophy, not elsewhere classified, unspecified site: Secondary | ICD-10-CM | POA: Diagnosis not present

## 2021-04-05 DIAGNOSIS — M5135 Other intervertebral disc degeneration, thoracolumbar region: Secondary | ICD-10-CM | POA: Diagnosis not present

## 2021-04-05 DIAGNOSIS — M545 Low back pain, unspecified: Secondary | ICD-10-CM | POA: Diagnosis not present

## 2021-04-07 DIAGNOSIS — M545 Low back pain, unspecified: Secondary | ICD-10-CM | POA: Diagnosis not present

## 2021-04-07 DIAGNOSIS — M5135 Other intervertebral disc degeneration, thoracolumbar region: Secondary | ICD-10-CM | POA: Diagnosis not present

## 2021-04-07 DIAGNOSIS — M6250 Muscle wasting and atrophy, not elsewhere classified, unspecified site: Secondary | ICD-10-CM | POA: Diagnosis not present

## 2021-04-07 DIAGNOSIS — F438 Other reactions to severe stress: Secondary | ICD-10-CM | POA: Diagnosis not present

## 2021-04-11 DIAGNOSIS — F438 Other reactions to severe stress: Secondary | ICD-10-CM | POA: Diagnosis not present

## 2021-04-12 DIAGNOSIS — M545 Low back pain, unspecified: Secondary | ICD-10-CM | POA: Diagnosis not present

## 2021-04-12 DIAGNOSIS — M6250 Muscle wasting and atrophy, not elsewhere classified, unspecified site: Secondary | ICD-10-CM | POA: Diagnosis not present

## 2021-04-12 DIAGNOSIS — M5135 Other intervertebral disc degeneration, thoracolumbar region: Secondary | ICD-10-CM | POA: Diagnosis not present

## 2021-04-14 DIAGNOSIS — L820 Inflamed seborrheic keratosis: Secondary | ICD-10-CM | POA: Diagnosis not present

## 2021-04-14 DIAGNOSIS — L304 Erythema intertrigo: Secondary | ICD-10-CM | POA: Diagnosis not present

## 2021-04-14 DIAGNOSIS — M5416 Radiculopathy, lumbar region: Secondary | ICD-10-CM | POA: Diagnosis not present

## 2021-04-14 DIAGNOSIS — L858 Other specified epidermal thickening: Secondary | ICD-10-CM | POA: Diagnosis not present

## 2021-04-14 DIAGNOSIS — L218 Other seborrheic dermatitis: Secondary | ICD-10-CM | POA: Diagnosis not present

## 2021-04-14 DIAGNOSIS — L570 Actinic keratosis: Secondary | ICD-10-CM | POA: Diagnosis not present

## 2021-04-18 ENCOUNTER — Other Ambulatory Visit: Payer: Self-pay | Admitting: Physician Assistant

## 2021-04-18 NOTE — Telephone Encounter (Signed)
Next Visit: 05/05/2021  Last Visit: 10/20/2020  Last Fill: 01/17/2021  DX:  Idiopathic chronic gout of multiple sites without tophus   Current Dose per office note 10/20/2020, allopurinol 300 mg 1 tablet by mouth daily   Labs: 10/20/2020, Uric acid is WNL-4.0. CBC WNL.  Please advise the patient to continue taking allopurinol as prescribed.   Okay to refill Allopurinol?

## 2021-04-19 ENCOUNTER — Encounter: Payer: Self-pay | Admitting: Rheumatology

## 2021-04-19 DIAGNOSIS — M5135 Other intervertebral disc degeneration, thoracolumbar region: Secondary | ICD-10-CM | POA: Diagnosis not present

## 2021-04-19 DIAGNOSIS — M6250 Muscle wasting and atrophy, not elsewhere classified, unspecified site: Secondary | ICD-10-CM | POA: Diagnosis not present

## 2021-04-19 DIAGNOSIS — M545 Low back pain, unspecified: Secondary | ICD-10-CM | POA: Diagnosis not present

## 2021-04-20 ENCOUNTER — Ambulatory Visit: Payer: Medicare Other | Admitting: Rheumatology

## 2021-04-21 ENCOUNTER — Other Ambulatory Visit: Payer: Self-pay | Admitting: Cardiovascular Disease

## 2021-04-21 DIAGNOSIS — F438 Other reactions to severe stress: Secondary | ICD-10-CM | POA: Diagnosis not present

## 2021-04-22 DIAGNOSIS — M5135 Other intervertebral disc degeneration, thoracolumbar region: Secondary | ICD-10-CM | POA: Diagnosis not present

## 2021-04-22 DIAGNOSIS — M545 Low back pain, unspecified: Secondary | ICD-10-CM | POA: Diagnosis not present

## 2021-04-22 DIAGNOSIS — M6250 Muscle wasting and atrophy, not elsewhere classified, unspecified site: Secondary | ICD-10-CM | POA: Diagnosis not present

## 2021-04-22 NOTE — Progress Notes (Signed)
Office Visit Note  Patient: Jose [name]             Date of Birth: [birth year]           MRN: [id number]             PCP: [name], MD Referring: [name], MD Visit Date: 05/05/2021 Occupation: @GUAROCC @  Subjective:  Arthritis (Doing good, wants right 4th digit injection)   History of Present Illness: Jose [name] is a 70 y.o. male with a history of gout and osteoarthritis.  He states he has not had any gout flare.  He has been having right ring trigger finger.  Right fifth trigger finger has resolved.  He continues to have some stiffness in his hands.  He has off-and-on stiffness in his knees and is feet.  He denies any joint swelling.  He had good results from bilateral quadriceps surgery.  Activities of Daily Living:  Patient reports morning stiffness for 20 minutes.   Patient Reports nocturnal pain.  Difficulty dressing/grooming: Denies Difficulty climbing stairs: Reports Difficulty getting out of chair: Denies Difficulty using hands for taps, buttons, cutlery, and/or writing: Denies  Review of Systems  Constitutional: Positive for fatigue. Negative for night sweats.  HENT: Negative for mouth sores, mouth dryness and nose dryness.   Eyes: Positive for dryness. Negative for redness.  Respiratory: Negative for shortness of breath and difficulty breathing.   Cardiovascular: Negative for chest pain, palpitations, hypertension, irregular heartbeat and swelling in legs/feet.  Gastrointestinal: Negative for constipation and diarrhea.  Endocrine: Negative for increased urination.  Genitourinary: Negative for difficulty urinating.  Musculoskeletal: Positive for arthralgias, joint pain and morning stiffness. Negative for joint swelling, myalgias, muscle weakness, muscle tenderness and myalgias.  Skin: Negative for color change, rash, hair loss, nodules/bumps, skin tightness, ulcers and sensitivity to sunlight.  Allergic/Immunologic: Negative for susceptible to  infections.  Neurological: Positive for numbness. Negative for dizziness, fainting, memory loss, night sweats and weakness ( ).  Hematological: Negative for bruising/bleeding tendency and swollen glands.  Psychiatric/Behavioral: Negative for depressed mood and sleep disturbance. The patient is not nervous/anxious.     PMFS History:  Patient Active Problem List   Diagnosis Date Noted  . Hypertension   . Severe aortic stenosis 10/08/2019  . Candidiasis   . Abnormality of gait   . Hypoalbuminemia due to protein-calorie malnutrition ([place])   . History of gout   . Sleep disturbance   . Constipation   . Rupture of quadriceps tendon, right, sequela 06/19/2017  . Quadriceps tendon rupture, left, sequela 06/19/2017  . Acute blood loss anemia 06/19/2017  . Fall   . History of subdural hematoma   . Post-operative pain   . Recurrent falls   . Quadriceps tendon rupture 06/15/2017  . Primary osteoarthritis of both knees 02/28/2017  . Primary osteoarthritis of both hands 02/28/2017  . Uricacidemia 02/28/2017  . Pain in joint of left knee 02/07/2017  . Idiopathic chronic gout, unspecified site, without tophus (tophi) 11/29/2016  . HEMATURIA UNSPECIFIED 01/25/2009  . ABDOMINAL PAIN 01/25/2009  . XERODERMA 03/10/2008  . UNS ADVRS EFF UNS RX MEDICINAL&BIOLOGICAL SBSTNC 03/10/2008  . ADJUSTMENT REACTION WITH PHYSICAL SYMPTOMS 02/14/2008  . MUSCLE SPASM 02/14/2008  . ACUTE PROSTATITIS 12/16/2007  . BREAST LUMP OR MASS, RIGHT 07/12/2007  . H/O: RCT (rotator cuff tear) 01/13/2007  . GOUT 01/08/2007    Past Medical History:  Diagnosis Date  . Arthritis    R shoulder, great toes, hands- has gout & has injections  in knees with Dr. Estanislado Pandy   . Bicuspid aortic valve with ascending aorta 4.0 to 4.5 cm in diameter   . Cancer ([place])    skin ca-   . GERD (gastroesophageal reflux disease)   . Headache   . History of hiatal hernia   . Hypertension   . SDH (subdural hematoma) ([place])   . Severe  aortic stenosis   . Vertebral artery aneurysm ([facility])     Family History  Problem Relation Age of Onset  . Parkinson's disease Mother   . Heart disease Father   . Parkinson's disease Brother   . Diabetes Brother    Past Surgical History:  Procedure Laterality Date  . BRAIN SURGERY  2016   evacuation of SDH  . CARDIAC CATHETERIZATION    . CARPAL TUNNEL RELEASE Bilateral   . GREEN LIGHT LASER TURP (TRANSURETHRAL RESECTION OF PROSTATE  04/27/2017  . KNEE ARTHROPLASTY    . LEFT HEART CATH AND CORONARY ANGIOGRAPHY N/A 10/08/2019   Procedure: LEFT HEART CATH AND CORONARY ANGIOGRAPHY;  Surgeon: Sherren Mocha, MD;  Location: [facility];  Service: Cardiovascular;  Laterality: N/A;  . QUADRICEPS TENDON REPAIR Bilateral 06/15/2017   Procedure: REPAIR QUADRICEP TENDON;  Surgeon: Meredith Pel, MD;  Location: [place];  Service: Orthopedics;  Laterality: Bilateral;  . SHOULDER SURGERY Right   . TRANSCATHETER AORTIC VALVE REPLACEMENT, TRANSFEMORAL  12/23/2019  . TRANSCATHETER AORTIC VALVE REPLACEMENT, TRANSFEMORAL N/A 12/23/2019   Procedure: TRANSCATHETER AORTIC VALVE REPLACEMENT, TRANSFEMORAL;  Surgeon: Sherren Mocha, MD;  Location: [place];  Service: Open Heart Surgery;  Laterality: N/A;  . VASCULAR SURGERY     Social History   Social History Narrative  . Not on file   Immunization History  Administered Date(s) Administered  . PFIZER(Purple Top)SARS-COV-2 Vaccination 02/19/2020, 03/17/2020, 10/25/2020     Objective: Vital Signs: BP 119/79 (BP Location: Left Arm, Patient Position: Sitting, Cuff Size: Normal)   Pulse (!) 58   Resp 15   Ht 6' (1.829 m)   Wt 208 lb (94.3 kg)   BMI 28.21 kg/m    Physical Exam Vitals and nursing note reviewed.  Constitutional:      Appearance: He is well-developed.  HENT:     Head: Normocephalic and atraumatic.  Eyes:     Conjunctiva/sclera: Conjunctivae normal.     Pupils: Pupils are equal, round, and reactive to light.  Cardiovascular:      Rate and Rhythm: Normal rate and regular rhythm.     Heart sounds: Normal heart sounds.  Pulmonary:     Effort: Pulmonary effort is normal.     Breath sounds: Normal breath sounds.  Abdominal:     General: Bowel sounds are normal.     Palpations: Abdomen is soft.  Musculoskeletal:     Cervical back: Normal range of motion and neck supple.  Skin:    General: Skin is warm and dry.     Capillary Refill: Capillary refill takes less than 2 seconds.  Neurological:     Mental Status: He is alert and oriented to person, place, and time.  Psychiatric:        Behavior: Behavior normal.      Musculoskeletal Exam: C-spine with good range of motion.  Bilateral shoulder joint abduction is limited to 90 degrees.elbow joints, wrist joints with good range of motion.  He had bilateral PIP and DIP thickening.  He had right fourth trigger finger A3 pulley.  Hip joints and knee joints with good range of motion.  He had no tenderness over ankles or MTPs. CDAI Exam: CDAI Score: -- Patient Global: --; Provider Global: -- Swollen: --; Tender: -- Joint Exam 05/05/2021   No joint exam has been documented for this visit   There is currently no information documented on the homunculus. Go to the Rheumatology activity and complete the homunculus joint exam.  Investigation: No additional findings.  Imaging: No results found.  Recent Labs: Lab Results  Component Value Date   WBC 8.2 10/20/2020   HGB 15.5 10/20/2020   PLT 181 10/20/2020   NA 141 10/20/2020   K 4.1 10/20/2020   CL 102 10/20/2020   CO2 26 10/20/2020   GLUCOSE 99 10/20/2020   BUN 20 10/20/2020   CREATININE 1.26 10/20/2020   BILITOT 0.4 10/20/2020   ALKPHOS 82 10/20/2020   AST 30 10/20/2020   ALT 32 10/20/2020   PROT 6.5 10/20/2020   ALBUMIN 4.4 10/20/2020   CALCIUM 9.4 10/20/2020   GFRAA 66 10/20/2020    Speciality Comments: No specialty comments available.  Procedures:  Hand/UE Inj: R ring A3 for trigger finger on  05/05/2021 3:57 PM Indications: pain, tendon swelling and therapeutic Details: 27 G needle, ultrasound-guided volar approach Medications: 0.5 mL lidocaine 1 %; 10 mg triamcinolone acetonide 40 MG/ML Aspirate: 0 mL    Allergies: Ciprofloxacin and Quinolones   Assessment / Plan:     Visit Diagnoses: Idiopathic chronic gout of multiple sites without tophus - allopurinol 300 mg 1 tablet by mouth daily and colchicine 0.6 mg 1 tablet by mouth daily as needed during flares.  He has not had any flare since last visit.  He has been taking allopurinol on a regular basis.  Uric acid: 10/20/2020 4.0 - Plan: Uric acid  Medication monitoring encounter - Plan: CBC with Differential/Platelet, COMPLETE METABOLIC PANEL WITH GFR  Primary osteoarthritis of both hands -he has osteoarthritis in bilateral hands.  Joint protection muscle strengthening was discussed.   Placement  Primary osteoarthritis of both knees-he has off-and-on discomfort in his knee joints.  Quad strengthening was emphasized.  Primary osteoarthritis of both feet-doing well.  Neuropathy-he continues to have some neuropathy.  Trigger finger, right ring finger -A4 pulley injected on 10/07/2019 and 08/31/2020 -now he has developed A3 pulley flexor tenosynovitis.  After informed consent was obtained and different treatment options were discussed he wanted to proceed with the cortisone injection.  The procedure is described above.  He tolerated the procedure well.  A splint was applied.  Postprocedure instructions were given.  Plan: US Guided Needle Placement  Trigger little finger of right hand - injected on 10/07/2019 and 08/31/2020. Resolved   Quadriceps tendon rupture, left, sequela - He had a left revision quadriceps tendon reconstruction on 07/02/19 performed by Dr. Elisabeth Most.   Rupture of quadriceps tendon, right, sequela - Repaired by Dr. Marlou Sa in the past.  Subdural hematoma [facility])  H/O aortic valve replacement - Performed on 12/23/2019 by Dr.  Burt Knack.  He continues to follow-up with Dr. Johnsie Cancel.  [place]  Orders: Orders Placed This Encounter  Procedures  . US Guided Needle Placement  . CBC with Differential/Platelet  . COMPLETE METABOLIC PANEL WITH GFR  . Uric acid   No orders of the defined types were placed in this encounter.     Follow-Up Instructions: Return in about 6 months (around 11/05/2021) for Gout, Osteoarthritis.   Bo Merino, MD  Note - This record has been created using Editor, commissioning.  Chart creation errors have been sought, but may not always  have been located. Such creation errors do not reflect on  the standard of medical care.

## 2021-04-24 DIAGNOSIS — S29011A Strain of muscle and tendon of front wall of thorax, initial encounter: Secondary | ICD-10-CM | POA: Diagnosis not present

## 2021-04-24 DIAGNOSIS — X500XXA Overexertion from strenuous movement or load, initial encounter: Secondary | ICD-10-CM | POA: Diagnosis not present

## 2021-04-24 DIAGNOSIS — R079 Chest pain, unspecified: Secondary | ICD-10-CM | POA: Diagnosis not present

## 2021-04-24 DIAGNOSIS — M25511 Pain in right shoulder: Secondary | ICD-10-CM | POA: Diagnosis not present

## 2021-04-24 DIAGNOSIS — Z7982 Long term (current) use of aspirin: Secondary | ICD-10-CM | POA: Diagnosis not present

## 2021-04-25 ENCOUNTER — Other Ambulatory Visit: Payer: Self-pay | Admitting: Physician Assistant

## 2021-04-25 DIAGNOSIS — I350 Nonrheumatic aortic (valve) stenosis: Secondary | ICD-10-CM

## 2021-04-26 DIAGNOSIS — M5135 Other intervertebral disc degeneration, thoracolumbar region: Secondary | ICD-10-CM | POA: Diagnosis not present

## 2021-04-26 DIAGNOSIS — M6250 Muscle wasting and atrophy, not elsewhere classified, unspecified site: Secondary | ICD-10-CM | POA: Diagnosis not present

## 2021-04-26 DIAGNOSIS — M545 Low back pain, unspecified: Secondary | ICD-10-CM | POA: Diagnosis not present

## 2021-04-28 DIAGNOSIS — M6250 Muscle wasting and atrophy, not elsewhere classified, unspecified site: Secondary | ICD-10-CM | POA: Diagnosis not present

## 2021-04-28 DIAGNOSIS — M545 Low back pain, unspecified: Secondary | ICD-10-CM | POA: Diagnosis not present

## 2021-04-28 DIAGNOSIS — M5135 Other intervertebral disc degeneration, thoracolumbar region: Secondary | ICD-10-CM | POA: Diagnosis not present

## 2021-05-02 ENCOUNTER — Other Ambulatory Visit: Payer: Medicare Other

## 2021-05-02 DIAGNOSIS — M6250 Muscle wasting and atrophy, not elsewhere classified, unspecified site: Secondary | ICD-10-CM | POA: Diagnosis not present

## 2021-05-02 DIAGNOSIS — M5135 Other intervertebral disc degeneration, thoracolumbar region: Secondary | ICD-10-CM | POA: Diagnosis not present

## 2021-05-02 DIAGNOSIS — M545 Low back pain, unspecified: Secondary | ICD-10-CM | POA: Diagnosis not present

## 2021-05-05 ENCOUNTER — Other Ambulatory Visit: Payer: Self-pay

## 2021-05-05 ENCOUNTER — Encounter: Payer: Self-pay | Admitting: Rheumatology

## 2021-05-05 ENCOUNTER — Ambulatory Visit: Payer: Self-pay

## 2021-05-05 ENCOUNTER — Ambulatory Visit (INDEPENDENT_AMBULATORY_CARE_PROVIDER_SITE_OTHER): Payer: Medicare Other | Admitting: Rheumatology

## 2021-05-05 VITALS — BP 119/79 | HR 58 | Resp 15 | Ht 72.0 in | Wt 208.0 lb

## 2021-05-05 DIAGNOSIS — A809 Acute poliomyelitis, unspecified: Secondary | ICD-10-CM | POA: Diagnosis not present

## 2021-05-05 DIAGNOSIS — S76111S Strain of right quadriceps muscle, fascia and tendon, sequela: Secondary | ICD-10-CM

## 2021-05-05 DIAGNOSIS — G629 Polyneuropathy, unspecified: Secondary | ICD-10-CM

## 2021-05-05 DIAGNOSIS — R972 Elevated prostate specific antigen [PSA]: Secondary | ICD-10-CM | POA: Diagnosis not present

## 2021-05-05 DIAGNOSIS — M65341 Trigger finger, right ring finger: Secondary | ICD-10-CM | POA: Diagnosis not present

## 2021-05-05 DIAGNOSIS — R3129 Other microscopic hematuria: Secondary | ICD-10-CM | POA: Diagnosis not present

## 2021-05-05 DIAGNOSIS — M170 Bilateral primary osteoarthritis of knee: Secondary | ICD-10-CM

## 2021-05-05 DIAGNOSIS — Z952 Presence of prosthetic heart valve: Secondary | ICD-10-CM | POA: Diagnosis not present

## 2021-05-05 DIAGNOSIS — M19042 Primary osteoarthritis, left hand: Secondary | ICD-10-CM

## 2021-05-05 DIAGNOSIS — S065XAA Traumatic subdural hemorrhage with loss of consciousness status unknown, initial encounter: Secondary | ICD-10-CM

## 2021-05-05 DIAGNOSIS — M19072 Primary osteoarthritis, left ankle and foot: Secondary | ICD-10-CM

## 2021-05-05 DIAGNOSIS — N401 Enlarged prostate with lower urinary tract symptoms: Secondary | ICD-10-CM | POA: Diagnosis not present

## 2021-05-05 DIAGNOSIS — S76112S Strain of left quadriceps muscle, fascia and tendon, sequela: Secondary | ICD-10-CM | POA: Diagnosis not present

## 2021-05-05 DIAGNOSIS — M1A09X0 Idiopathic chronic gout, multiple sites, without tophus (tophi): Secondary | ICD-10-CM | POA: Diagnosis not present

## 2021-05-05 DIAGNOSIS — S065X9A Traumatic subdural hemorrhage with loss of consciousness of unspecified duration, initial encounter: Secondary | ICD-10-CM

## 2021-05-05 DIAGNOSIS — M19041 Primary osteoarthritis, right hand: Secondary | ICD-10-CM | POA: Diagnosis not present

## 2021-05-05 DIAGNOSIS — M65351 Trigger finger, right little finger: Secondary | ICD-10-CM

## 2021-05-05 DIAGNOSIS — I2510 Atherosclerotic heart disease of native coronary artery without angina pectoris: Secondary | ICD-10-CM

## 2021-05-05 DIAGNOSIS — Z5181 Encounter for therapeutic drug level monitoring: Secondary | ICD-10-CM | POA: Diagnosis not present

## 2021-05-05 DIAGNOSIS — R350 Frequency of micturition: Secondary | ICD-10-CM | POA: Diagnosis not present

## 2021-05-05 DIAGNOSIS — M19071 Primary osteoarthritis, right ankle and foot: Secondary | ICD-10-CM

## 2021-05-05 DIAGNOSIS — F438 Other reactions to severe stress: Secondary | ICD-10-CM | POA: Diagnosis not present

## 2021-05-05 MED ORDER — TRIAMCINOLONE ACETONIDE 40 MG/ML IJ SUSP
10.0000 mg | INTRAMUSCULAR | Status: AC | PRN
  Administered 2021-05-05: 10 mg

## 2021-05-05 MED ORDER — LIDOCAINE HCL 1 % IJ SOLN
0.5000 mL | INTRAMUSCULAR | Status: AC | PRN
  Administered 2021-05-05: .5 mL

## 2021-05-06 ENCOUNTER — Ambulatory Visit (HOSPITAL_COMMUNITY): Payer: Medicare Other

## 2021-05-06 LAB — COMPLETE METABOLIC PANEL WITH GFR
AG Ratio: 1.8 (calc) (ref 1.0–2.5)
ALT: 27 U/L (ref 9–46)
AST: 23 U/L (ref 10–35)
Albumin: 4 g/dL (ref 3.6–5.1)
Alkaline phosphatase (APISO): 70 U/L (ref 35–144)
BUN/Creatinine Ratio: 15 (calc) (ref 6–22)
BUN: 18 mg/dL (ref 7–25)
CO2: 27 mmol/L (ref 20–32)
Calcium: 9.3 mg/dL (ref 8.6–10.3)
Chloride: 109 mmol/L (ref 98–110)
Creat: 1.2 mg/dL — ABNORMAL HIGH (ref 0.70–1.18)
GFR, Est African American: 70 mL/min/{1.73_m2} (ref >=60)
GFR, Est Non African American: 60 mL/min/{1.73_m2} (ref >=60)
Globulin: 2.2 g/dL (calc) (ref 1.9–3.7)
Glucose, Bld: 85 mg/dL (ref 65–99)
Potassium: 4.1 mmol/L (ref 3.5–5.3)
Sodium: 144 mmol/L (ref 135–146)
Total Bilirubin: 0.3 mg/dL (ref 0.2–1.2)
Total Protein: 6.2 g/dL (ref 6.1–8.1)

## 2021-05-06 LAB — CBC WITH DIFFERENTIAL/PLATELET
Absolute Monocytes: 827 cells/uL (ref 200–950)
Basophils Absolute: 78 cells/uL (ref 0–200)
Basophils Relative: 1 %
Eosinophils Absolute: 172 cells/uL (ref 15–500)
Eosinophils Relative: 2.2 %
HCT: 42.3 % (ref 38.5–50.0)
Hemoglobin: 14.2 g/dL (ref 13.2–17.1)
Lymphs Abs: 1622 cells/uL (ref 850–3900)
MCH: 31.2 pg (ref 27.0–33.0)
MCHC: 33.6 g/dL (ref 32.0–36.0)
MCV: 93 fL (ref 80.0–100.0)
MPV: 11.3 fL (ref 7.5–12.5)
Monocytes Relative: 10.6 %
Neutro Abs: 5101 cells/uL (ref 1500–7800)
Neutrophils Relative %: 65.4 %
Platelets: 236 10*3/uL (ref 140–400)
RBC: 4.55 10*6/uL (ref 4.20–5.80)
RDW: 13 % (ref 11.0–15.0)
Total Lymphocyte: 20.8 %
WBC: 7.8 10*3/uL (ref 3.8–10.8)

## 2021-05-06 LAB — URIC ACID: Uric Acid, Serum: 3.5 mg/dL — ABNORMAL LOW (ref 4.0–8.0)

## 2021-05-06 NOTE — Progress Notes (Signed)
CBC is normal, creatinine is mildly elevated.  Please advise patient to avoid NSAIDs.  We will continue to monitor.  Uric acid is 3.5 which is in the desirable range.

## 2021-05-09 DIAGNOSIS — M6250 Muscle wasting and atrophy, not elsewhere classified, unspecified site: Secondary | ICD-10-CM | POA: Diagnosis not present

## 2021-05-09 DIAGNOSIS — M545 Low back pain, unspecified: Secondary | ICD-10-CM | POA: Diagnosis not present

## 2021-05-09 DIAGNOSIS — M5135 Other intervertebral disc degeneration, thoracolumbar region: Secondary | ICD-10-CM | POA: Diagnosis not present

## 2021-05-11 DIAGNOSIS — M5135 Other intervertebral disc degeneration, thoracolumbar region: Secondary | ICD-10-CM | POA: Diagnosis not present

## 2021-05-11 DIAGNOSIS — M545 Low back pain, unspecified: Secondary | ICD-10-CM | POA: Diagnosis not present

## 2021-05-11 DIAGNOSIS — M6250 Muscle wasting and atrophy, not elsewhere classified, unspecified site: Secondary | ICD-10-CM | POA: Diagnosis not present

## 2021-05-13 DIAGNOSIS — F438 Other reactions to severe stress: Secondary | ICD-10-CM | POA: Diagnosis not present

## 2021-05-19 DIAGNOSIS — M5135 Other intervertebral disc degeneration, thoracolumbar region: Secondary | ICD-10-CM | POA: Diagnosis not present

## 2021-05-19 DIAGNOSIS — M6250 Muscle wasting and atrophy, not elsewhere classified, unspecified site: Secondary | ICD-10-CM | POA: Diagnosis not present

## 2021-05-19 DIAGNOSIS — M545 Low back pain, unspecified: Secondary | ICD-10-CM | POA: Diagnosis not present

## 2021-05-24 DIAGNOSIS — M6250 Muscle wasting and atrophy, not elsewhere classified, unspecified site: Secondary | ICD-10-CM | POA: Diagnosis not present

## 2021-05-24 DIAGNOSIS — M545 Low back pain, unspecified: Secondary | ICD-10-CM | POA: Diagnosis not present

## 2021-05-24 DIAGNOSIS — M5135 Other intervertebral disc degeneration, thoracolumbar region: Secondary | ICD-10-CM | POA: Diagnosis not present

## 2021-05-25 NOTE — Telephone Encounter (Signed)
Offered pt sooner echo appt for next Tuesday.   He is agreeable. Aware scheduled for 4 pm and will see in mychart. He is ok with CT to remain where it is as we cannot get that moved up sooner. Patient verbalized understanding and agreeable to plan.

## 2021-05-27 DIAGNOSIS — F438 Other reactions to severe stress: Secondary | ICD-10-CM | POA: Diagnosis not present

## 2021-05-29 NOTE — Progress Notes (Signed)
Cardiology Office Note:    Date:  05/29/2021   ID:  Jose [name], DOB 05/17/1951, MRN [id number]  PCP:  [name], MD  [facility] Cardiologist:  [name], MD / Dr. Burt Knack & Dr. Cyndia Bent (TAVR)  Virtual Visit via Video Note   This visit type was conducted due to national recommendations for restrictions regarding the COVID-19 Pandemic (e.g. social distancing) in an effort to limit this patient's exposure and mitigate transmission in our community.  Due to her co-morbid illnesses, this patient is at least at moderate risk for complications without adequate follow up.  This format is felt to be most appropriate for this patient at this time.  All issues noted in this document were discussed and addressed.  A limited physical exam was performed with this format.  Please refer to the patient's chart for her consent to telehealth for [facility].   Patient Location: Home Physician Location: Office   History of Present Illness:    70 y.o. with history of GERD, HTN,HLD, spontaneous CNS bleed with craniotomy 2016 when living in [place]. Bicuspid AV with severe AS and dilated aortic root 4.3 cm. Had TAVR with 26 mm Edwards Sapien valve 12/23/19.  Cath prior to procedure showed only obstructive CAD in diagonal  Rx medically Post op TTE with normal EF mean gradient 11 mmHg no PVL. Plavix now d/c due to history of [place]  TTE 12/22/20 had new mild central AR EF 60-65% mean gradient 14 peak 26 mmHg with DVI 0.30   LDL 76 on statin   No cardiac symptoms Getting CTA for aneurysm 06/03/2021      Past Medical History:  Diagnosis Date   Arthritis    R shoulder, great toes, hands- has gout & has injections in knees with Dr. Estanislado Pandy    Bicuspid aortic valve with ascending aorta 4.0 to 4.5 cm in diameter    Cancer ([facility])    skin ca-    GERD (gastroesophageal reflux disease)    Headache    History of hiatal hernia    Hypertension    SDH (subdural hematoma)  ([facility])    Severe aortic stenosis    Vertebral artery aneurysm [facility])     Past Surgical History:  Procedure Laterality Date   BRAIN SURGERY  2016   evacuation of SDH   CARDIAC CATHETERIZATION     CARPAL TUNNEL RELEASE Bilateral    GREEN LIGHT LASER TURP (TRANSURETHRAL RESECTION OF PROSTATE  04/27/2017   KNEE ARTHROPLASTY     LEFT HEART CATH AND CORONARY ANGIOGRAPHY N/A 10/08/2019   Procedure: LEFT HEART CATH AND CORONARY ANGIOGRAPHY;  Surgeon: Sherren Mocha, MD;  Location: [place] [facility];  Service: Cardiovascular;  Laterality: N/A;   QUADRICEPS TENDON REPAIR Bilateral 06/15/2017   Procedure: REPAIR QUADRICEP TENDON;  Surgeon: Meredith Pel, MD;  Location: [place];  Service: Orthopedics;  Laterality: Bilateral;   SHOULDER SURGERY Right    TRANSCATHETER AORTIC VALVE REPLACEMENT, TRANSFEMORAL  12/23/2019   TRANSCATHETER AORTIC VALVE REPLACEMENT, TRANSFEMORAL N/A 12/23/2019   Procedure: TRANSCATHETER AORTIC VALVE REPLACEMENT, TRANSFEMORAL;  Surgeon: Sherren Mocha, MD;  Location: [place];  Service: Open Heart Surgery;  Laterality: N/A;   VASCULAR SURGERY      Current Medications: No outpatient medications have been marked as taking for the 06/03/21  encounter (Appointment) with Josue Hector, MD.     Allergies:   Ciprofloxacin and Quinolones   Social History   Socioeconomic History   Marital status: Divorced    Spouse name: Not on file   Number of children: Not on file   Years of education: Not on file   Highest education level: Not on file  Occupational History   Not on file  Tobacco Use   Smoking status: Never   Smokeless tobacco: Never  Vaping Use   Vaping Use: Never used  Substance and Sexual Activity   Alcohol use: Yes    Alcohol/week: 8.0 standard drinks    Types: 7 Glasses of wine, 1 Shots of liquor per week   Drug use: No   Sexual activity: Not on file  Other Topics Concern   Not on file  Social History Narrative   Not on file   Social Determinants of  Health   Financial Resource Strain: Not on file  Food Insecurity: Not on file  Transportation Needs: Not on file  Physical Activity: Not on file  Stress: Not on file  Social Connections: Not on file     Family History: The patient's family history includes Diabetes in his brother; Heart disease in his father; Parkinson's disease in his brother and mother.  ROS:   Please see the history of present illness.    All other systems reviewed and are negative.  EKGs/Labs/Other Studies Reviewed:    The following studies were reviewed today: TAVR OPERATIVE NOTE     Date of Procedure:                12/23/2019   Preoperative Diagnosis:      Severe Aortic Stenosis    Postoperative Diagnosis:    Same    Procedure:        Transcatheter Aortic Valve Replacement - Percutaneous Transfemoral Approach             Edwards Sapien 3 UltraTHV (size 26 mm, model # 9750TFX, serial # [id number])              Co-Surgeons:                        Gaye Pollack, MD and Sherren Mocha, MD   Anesthesiologist:                  Duane Boston, MD   Echocardiographer:              [name], MD   Pre-operative Echo Findings: Severe bicuspid aortic stenosis Normal left ventricular systolic function   Post-operative Echo Findings: No paravalvular leak Normal left ventricular systolic function   ______________   12/24/19 ECHO IMPRESSIONS  1. Left ventricular ejection fraction, by visual estimation, is 60 to 65%. The left ventricle has normal function. There is mildly increased left ventricular hypertrophy.  2. Left ventricular diastolic parameters are consistent with Grade I diastolic dysfunction (impaired relaxation).  3. The left ventricle has no regional wall motion abnormalities.  4. Global right ventricle has normal systolic function.The right ventricular size is normal. No increase in right ventricular wall thickness.  5. Left atrial size was normal.  6. Right atrial size was normal.  7. The  mitral valve is normal in structure. No evidence of mitral valve regurgitation. No evidence of mitral stenosis.  8. The tricuspid valve is normal in structure.  9. Aortic valve regurgitation is not visualized. No evidence of aortic  valve sclerosis or stenosis. 10. The pulmonic valve was normal in structure. Pulmonic valve regurgitation is not visualized. 11. There is moderate dilatation of the ascending aorta measuring 45 mm. 12. The inferior vena cava is normal in size with greater than 50% respiratory variability, suggesting right atrial pressure of 3 mmHg. 13. S/P TAVR with a 26 mm Edwards-SAPIEN 3 Ultra valve, peak/mean gradients 17/11 mmHg. No paravalvular leak.   Aortic Valve: The aortic valve has been repaired/replaced. Aortic valve regurgitation is not visualized. The aortic valve is structurally normal, with no evidence of sclerosis or stenosis. Aortic valve mean gradient measures 10.5 mmHg. Aortic valve peak  gradient measures 17.0 mmHg. Aortic valve area, by VTI measures 3.38 cm. 26 Edwards Sapien bioprosthetic, stented aortic valve (TAVR) valve is present in the aortic position.   __________________   Echo 01/15/20 IMPRESSIONS   1. Left ventricular ejection fraction, by visual estimation, is 55 to 60%. The left ventricle has normal function. There is mildly increased left ventricular hypertrophy.   2. Left ventricular diastolic parameters are consistent with Grade I diastolic dysfunction (impaired relaxation).   3. The left ventricle has no regional wall motion abnormalities.   4. Global right ventricle has normal systolic function.The right ventricular size is normal. No increase in right ventricular wall thickness.   5. Left atrial size was mild-moderately dilated.   6. Right atrial size was normal.   7. The mitral valve is normal in structure. Trivial mitral valve regurgitation.   8. The tricuspid valve is normal in structure.   9. Aortic stent-valve (TAVR) prosthesis is well  seated, without perivalvular leak.  10. Aortic valve mean gradient measures 13.7 mmHg.  11. Aortic valve peak gradient measures 24.4 mmHg.  12. Aortic valve regurgitation is not visualized.  13. The pulmonic valve was not well visualized. Pulmonic valve regurgitation is not visualized.  14. Aortic dilatation noted.  15. There is mild to moderate dilatation of the ascending aorta measuring  44 mm.  16. No significant change from prior study.  17. Prior images reviewed side by side.  18. When compared to the 12/24/2019 study, the TAVR gradients are very slightly higher. The changes in TAVR dimensionless index and calculated valve area are due to differences in LVOT pulsed wave sampling technique, rather than a true change in valve hemodynamics.   _______________  CT ANGIOGRAPHY CHEST WITH CONTRAST 05/06/20 TECHNIQUE: Multidetector CT imaging of the chest was performed using the standard protocol during bolus administration of intravenous contrast. Multiplanar CT image reconstructions and MIPs were obtained to evaluate the vascular anatomy.   CONTRAST:  149mL OMNIPAQUE IOHEXOL 350 MG/ML SOLN   COMPARISON:  11/03/2019   FINDINGS: Cardiovascular: Unchanged ascending aortic aneurysm with maximal diameter 4.4 cm. Interval TAVR. Mild cardiomegaly. No pericardial effusion. Normal caliber pulmonary arteries without central pulmonary emboli identified on this nondedicated study.   Mediastinum/Nodes: No enlarged axillary, mediastinal, or hilar lymph nodes. Unremarkable thyroid and esophagus.   Lungs/Pleura: No pleural effusion or pneumothorax. Mild respiratory motion artifact. No lung consolidation or mass.   Upper Abdomen: Subcentimeter hypodense lesions in the left kidney, similar to the prior study and too small to fully characterize. Small duodenal diverticulum.   Musculoskeletal: No acute osseous abnormality or suspicious osseous lesion. Moderate thoracic disc degeneration.    Review of the MIP images confirms the above findings.   IMPRESSION: 1. Unchanged 4.4 cm ascending aortic aneurysm. 2. Interval TAVR.  __________________  Echo 12/22/20 IMPRESSIONS  1. Left ventricular ejection fraction, by estimation, is  60 to 65%. The  left ventricle has normal function. The left ventricle has no regional  wall motion abnormalities. There is mild left ventricular hypertrophy.  Left ventricular diastolic parameters  are consistent with Grade I diastolic dysfunction (impaired relaxation).   2. Right ventricular systolic function is normal. The right ventricular  size is mildly enlarged.   3. The mitral valve is normal in structure. Trivial mitral valve  regurgitation. No evidence of mitral stenosis.   4. Mild TAVR valve regurgitation. The aortic valve has been  repaired/replaced. Aortic valve regurgitation is mild. No aortic stenosis  is present. There is a 26 mm Sapien prosthetic (TAVR) valve present in the  aortic position. Procedure Date: 12/23/19.  Aortic regurgitation PHT measures 833 msec. Aortic valve mean gradient  measures 14.0 mmHg. Aortic valve Vmax measures 2.56 m/s.   5. Aortic dilatation noted. There is moderate dilatation of the ascending  aorta, measuring 46 mm.   6. The inferior vena cava is normal in size with greater than 50%  respiratory variability, suggesting right atrial pressure of 3 mmHg.   Comparison(s): When compared to prior, there is a mild aortic  regurgitation signal.    EKG:     SR rate 64 LAD 01/01/20   Recent Labs: 05/05/2021: ALT 27; BUN 18; Creat 1.20; Hemoglobin 14.2; Platelets 236; Potassium 4.1; Sodium 144  Recent Lipid Panel    Component Value Date/Time   CHOL 162 10/20/2020 0832   TRIG 93 10/20/2020 0832   HDL 73 10/20/2020 0832   CHOLHDL 2.2 10/20/2020 0832   CHOLHDL 3.7 CALC 12/25/2007 1636   VLDL 25 12/25/2007 1636   LDLCALC 72 10/20/2020 0832   LDLDIRECT 164.4 12/25/2007 1636   Risk Assessment/Calculations:        Physical Exam:    VS:  There were no vitals taken for this visit.    Wt Readings from Last 3 Encounters:  05/05/21 94.3 kg  12/22/20 92.9 kg  10/20/20 93.9 kg     No distress No JVP elevation No tachypnea No edema   PLAN:    In order of problems listed above:  1.S/p TAVR: 12/23/19 with 26 mm Sapien 3 valve gradients stable no PVL had mild central AR on TTE 12/22/20 will see next Jauary with updated echo same day   2. HTN:  On Toprol stable   3. Aneursym: CT scan 05/05/20 showed stable 4.4 cm ascending aortic root  History of bicuspid AV F/U CT 06/03/21    4. CAD: Cath prior to TAVR 10/08/19 with 75% small D1 and 50% mid LAD. No chest pain. Continue medical therapy with ASA, statin and BB. CM note indicates D1 not suitable for PCI  Continue beta blocker, baby asa, statin Plavix d/c with history of ICH  LDL at goal      Medication Adjustments/Labs and Tests Ordered:  Echo for TAVR 12/2021   Time : spent reviewing Cath, multiple echos CT chest direct patient interview and composing note 30 minutes   Signed, [name], MD  05/29/2021 2:05 PM    [place]

## 2021-05-31 ENCOUNTER — Ambulatory Visit (HOSPITAL_COMMUNITY): Payer: Medicare Other | Attending: Internal Medicine

## 2021-05-31 ENCOUNTER — Other Ambulatory Visit: Payer: Self-pay

## 2021-05-31 ENCOUNTER — Other Ambulatory Visit: Payer: Medicare Other | Admitting: *Deleted

## 2021-05-31 DIAGNOSIS — I712 Thoracic aortic aneurysm, without rupture: Secondary | ICD-10-CM

## 2021-05-31 DIAGNOSIS — Z952 Presence of prosthetic heart valve: Secondary | ICD-10-CM | POA: Diagnosis not present

## 2021-05-31 DIAGNOSIS — R972 Elevated prostate specific antigen [PSA]: Secondary | ICD-10-CM | POA: Diagnosis not present

## 2021-05-31 DIAGNOSIS — I359 Nonrheumatic aortic valve disorder, unspecified: Secondary | ICD-10-CM | POA: Diagnosis not present

## 2021-05-31 DIAGNOSIS — I7121 Aneurysm of the ascending aorta, without rupture: Secondary | ICD-10-CM

## 2021-05-31 LAB — ECHOCARDIOGRAM COMPLETE
AR max vel: 1.34 cm2
AV Area VTI: 1.38 cm2
AV Area mean vel: 1.32 cm2
AV Mean grad: 22 mmHg
AV Peak grad: 38.4 mmHg
Ao pk vel: 3.1 m/s
Area-P 1/2: 1.99 cm2
MV M vel: 5.15 m/s
MV Peak grad: 106.1 mmHg
P 1/2 time: 829 msec
S' Lateral: 3 cm

## 2021-06-01 ENCOUNTER — Telehealth: Payer: Self-pay

## 2021-06-01 ENCOUNTER — Other Ambulatory Visit: Payer: Medicare Other

## 2021-06-01 DIAGNOSIS — I2510 Atherosclerotic heart disease of native coronary artery without angina pectoris: Secondary | ICD-10-CM

## 2021-06-01 DIAGNOSIS — I350 Nonrheumatic aortic (valve) stenosis: Secondary | ICD-10-CM

## 2021-06-01 DIAGNOSIS — I712 Thoracic aortic aneurysm, without rupture: Secondary | ICD-10-CM

## 2021-06-01 DIAGNOSIS — Z79899 Other long term (current) drug therapy: Secondary | ICD-10-CM

## 2021-06-01 DIAGNOSIS — I7121 Aneurysm of the ascending aorta, without rupture: Secondary | ICD-10-CM

## 2021-06-01 LAB — BASIC METABOLIC PANEL
BUN/Creatinine Ratio: 16 (ref 10–24)
BUN: 18 mg/dL (ref 8–27)
CO2: 22 mmol/L (ref 20–29)
Calcium: 9.4 mg/dL (ref 8.6–10.2)
Chloride: 105 mmol/L (ref 96–106)
Creatinine, Ser: 1.1 mg/dL (ref 0.76–1.27)
Glucose: 65 mg/dL (ref 65–99)
Potassium: 4 mmol/L (ref 3.5–5.2)
Sodium: 144 mmol/L (ref 134–144)
eGFR: 72 mL/min/{1.73_m2} (ref >59)

## 2021-06-01 NOTE — Telephone Encounter (Signed)
Spoke with pt and advised per [name] valve has a small leak with increased gradients.  Pt will need a f/u echo in 6 months and may need a cardiac CTA in the future to look at valve as well.  Order placed for repeat echo in 6 months.  Pt verbalizes understanding and agrees with current plan.

## 2021-06-01 NOTE — Telephone Encounter (Signed)
-----   Message from Josue Hector, MD sent at 06/01/2021  8:16 AM EDT ----- Valve has small leak now and increased gradients f/u echo in 6 months and may need cardiac CTA in future to look at valve

## 2021-06-01 NOTE — Telephone Encounter (Signed)
Spoke with pt and advised per [name] pt may have fasting Lipid panel drawn 06/02/2021.  Order placed.  Pt verbalizes understanding and agrees with current plan.

## 2021-06-02 ENCOUNTER — Other Ambulatory Visit: Payer: Self-pay

## 2021-06-02 ENCOUNTER — Other Ambulatory Visit: Payer: Medicare Other | Admitting: *Deleted

## 2021-06-02 DIAGNOSIS — Z79899 Other long term (current) drug therapy: Secondary | ICD-10-CM

## 2021-06-02 DIAGNOSIS — I2510 Atherosclerotic heart disease of native coronary artery without angina pectoris: Secondary | ICD-10-CM

## 2021-06-02 DIAGNOSIS — F438 Other reactions to severe stress: Secondary | ICD-10-CM | POA: Diagnosis not present

## 2021-06-02 LAB — LIPID PANEL
Chol/HDL Ratio: 2.2 ratio (ref 0.0–5.0)
Cholesterol, Total: 165 mg/dL (ref 100–199)
HDL: 75 mg/dL (ref >39)
LDL Chol Calc (NIH): 76 mg/dL (ref 0–99)
Triglycerides: 74 mg/dL (ref 0–149)
VLDL Cholesterol Cal: 14 mg/dL (ref 5–40)

## 2021-06-03 ENCOUNTER — Encounter: Payer: Self-pay | Admitting: Cardiovascular Disease

## 2021-06-03 ENCOUNTER — Encounter (HOSPITAL_COMMUNITY): Payer: Self-pay

## 2021-06-03 ENCOUNTER — Ambulatory Visit (HOSPITAL_COMMUNITY)
Admission: RE | Admit: 2021-06-03 | Discharge: 2021-06-03 | Disposition: A | Discharge status: Home / Self Care | Payer: Medicare Other | Source: Ambulatory Visit | Attending: Cardiovascular Disease | Admitting: Cardiovascular Disease

## 2021-06-03 ENCOUNTER — Telehealth (INDEPENDENT_AMBULATORY_CARE_PROVIDER_SITE_OTHER): Payer: Medicare Other | Admitting: Cardiovascular Disease

## 2021-06-03 VITALS — BP 133/82 | HR 62 | Ht 72.0 in | Wt 197.0 lb

## 2021-06-03 DIAGNOSIS — Z952 Presence of prosthetic heart valve: Secondary | ICD-10-CM | POA: Diagnosis not present

## 2021-06-03 DIAGNOSIS — E782 Mixed hyperlipidemia: Secondary | ICD-10-CM

## 2021-06-03 DIAGNOSIS — I712 Thoracic aortic aneurysm, without rupture: Secondary | ICD-10-CM | POA: Diagnosis not present

## 2021-06-03 DIAGNOSIS — I2510 Atherosclerotic heart disease of native coronary artery without angina pectoris: Secondary | ICD-10-CM | POA: Diagnosis not present

## 2021-06-03 DIAGNOSIS — I700 Atherosclerosis of aorta: Secondary | ICD-10-CM | POA: Diagnosis not present

## 2021-06-03 DIAGNOSIS — I774 Celiac artery compression syndrome: Secondary | ICD-10-CM | POA: Diagnosis not present

## 2021-06-03 DIAGNOSIS — I7121 Aneurysm of the ascending aorta, without rupture: Secondary | ICD-10-CM

## 2021-06-03 MED ORDER — IOHEXOL 350 MG/ML SOLN
100.0000 mL | Freq: Once | INTRAVENOUS | Status: AC | PRN
  Administered 2021-06-03: 100 mL via INTRAVENOUS

## 2021-06-03 MED ORDER — SODIUM CHLORIDE (PF) 0.9 % IJ SOLN
INTRAMUSCULAR | Status: AC
  Filled 2021-06-03: qty 50

## 2021-06-03 NOTE — Addendum Note (Signed)
Addended by: Jacinta Shoe on: 06/03/2021 08:54 AM   Modules accepted: Orders

## 2021-06-03 NOTE — Patient Instructions (Signed)
Medication Instructions:  Your physician recommends that you continue on your current medications as directed. Please refer to the Current Medication list given to you today.  *If you need a refill on your cardiac medications before your next appointment, please call your pharmacy*   Lab Work: NONE If you have labs (blood work) drawn today and your tests are completely normal, you will receive your results only by: [place] (if you have MyChart) OR A paper copy in the mail If you have any lab test that is abnormal or we need to change your treatment, we will call you to review the results.   Testing/Procedures: Your physician has requested that you have an echocardiogram. Echocardiography is a painless test that uses sound waves to create images of your heart. It provides your doctor with information about the size and shape of your heart and how well your heart's chambers and valves are working. This procedure takes approximately one hour. There are no restrictions for this procedure. TO BE DONE IN January   Follow-Up: At [facility], you and your health needs are our priority.  As part of our continuing mission to provide you with exceptional heart care, we have created designated Provider Care Teams.  These Care Teams include your primary Cardiologist (physician) and Advanced Practice Providers (APPs -  Physician Assistants and Nurse Practitioners) who all work together to provide you with the care you need, when you need it.  We recommend signing up for the patient portal called "MyChart".  Sign up information is provided on this After Visit Summary.  MyChart is used to connect with patients for Virtual Visits (Telemedicine).  Patients are able to view lab/test results, encounter notes, upcoming appointments, etc.  Non-urgent messages can be sent to your provider as well.   To learn more about what you can do with MyChart, go to [email or web address].    Your next appointment:    7 month(s) SAME DAY AS ECHO  The format for your next appointment:   In Person  Provider:   [name], MD   Other Instructions

## 2021-06-13 DIAGNOSIS — M5135 Other intervertebral disc degeneration, thoracolumbar region: Secondary | ICD-10-CM | POA: Diagnosis not present

## 2021-06-13 DIAGNOSIS — M545 Low back pain, unspecified: Secondary | ICD-10-CM | POA: Diagnosis not present

## 2021-06-13 DIAGNOSIS — M6250 Muscle wasting and atrophy, not elsewhere classified, unspecified site: Secondary | ICD-10-CM | POA: Diagnosis not present

## 2021-06-14 ENCOUNTER — Other Ambulatory Visit (HOSPITAL_COMMUNITY): Payer: Medicare Other

## 2021-06-14 DIAGNOSIS — F438 Other reactions to severe stress: Secondary | ICD-10-CM | POA: Diagnosis not present

## 2021-06-15 DIAGNOSIS — M545 Low back pain, unspecified: Secondary | ICD-10-CM | POA: Diagnosis not present

## 2021-06-15 DIAGNOSIS — M5135 Other intervertebral disc degeneration, thoracolumbar region: Secondary | ICD-10-CM | POA: Diagnosis not present

## 2021-06-15 DIAGNOSIS — M6250 Muscle wasting and atrophy, not elsewhere classified, unspecified site: Secondary | ICD-10-CM | POA: Diagnosis not present

## 2021-06-22 DIAGNOSIS — M6250 Muscle wasting and atrophy, not elsewhere classified, unspecified site: Secondary | ICD-10-CM | POA: Diagnosis not present

## 2021-06-22 DIAGNOSIS — M545 Low back pain, unspecified: Secondary | ICD-10-CM | POA: Diagnosis not present

## 2021-06-22 DIAGNOSIS — M5135 Other intervertebral disc degeneration, thoracolumbar region: Secondary | ICD-10-CM | POA: Diagnosis not present

## 2021-06-24 DIAGNOSIS — M545 Low back pain, unspecified: Secondary | ICD-10-CM | POA: Diagnosis not present

## 2021-06-24 DIAGNOSIS — M5135 Other intervertebral disc degeneration, thoracolumbar region: Secondary | ICD-10-CM | POA: Diagnosis not present

## 2021-06-24 DIAGNOSIS — F438 Other reactions to severe stress: Secondary | ICD-10-CM | POA: Diagnosis not present

## 2021-06-24 DIAGNOSIS — M6250 Muscle wasting and atrophy, not elsewhere classified, unspecified site: Secondary | ICD-10-CM | POA: Diagnosis not present

## 2021-06-28 DIAGNOSIS — R972 Elevated prostate specific antigen [PSA]: Secondary | ICD-10-CM | POA: Diagnosis not present

## 2021-06-28 DIAGNOSIS — R9349 Abnormal radiologic findings on diagnostic imaging of other urinary organs: Secondary | ICD-10-CM | POA: Diagnosis not present

## 2021-06-29 DIAGNOSIS — F438 Other reactions to severe stress: Secondary | ICD-10-CM | POA: Diagnosis not present

## 2021-06-29 DIAGNOSIS — M6250 Muscle wasting and atrophy, not elsewhere classified, unspecified site: Secondary | ICD-10-CM | POA: Diagnosis not present

## 2021-06-29 DIAGNOSIS — M5135 Other intervertebral disc degeneration, thoracolumbar region: Secondary | ICD-10-CM | POA: Diagnosis not present

## 2021-06-29 DIAGNOSIS — M545 Low back pain, unspecified: Secondary | ICD-10-CM | POA: Diagnosis not present

## 2021-07-01 DIAGNOSIS — M6250 Muscle wasting and atrophy, not elsewhere classified, unspecified site: Secondary | ICD-10-CM | POA: Diagnosis not present

## 2021-07-01 DIAGNOSIS — M5135 Other intervertebral disc degeneration, thoracolumbar region: Secondary | ICD-10-CM | POA: Diagnosis not present

## 2021-07-01 DIAGNOSIS — M545 Low back pain, unspecified: Secondary | ICD-10-CM | POA: Diagnosis not present

## 2021-07-06 DIAGNOSIS — H43813 Vitreous degeneration, bilateral: Secondary | ICD-10-CM | POA: Diagnosis not present

## 2021-07-06 DIAGNOSIS — H2513 Age-related nuclear cataract, bilateral: Secondary | ICD-10-CM | POA: Diagnosis not present

## 2021-07-11 DIAGNOSIS — F438 Other reactions to severe stress: Secondary | ICD-10-CM | POA: Diagnosis not present

## 2021-07-15 ENCOUNTER — Other Ambulatory Visit: Payer: Self-pay | Admitting: Physician Assistant

## 2021-07-15 NOTE — Telephone Encounter (Signed)
Next Visit: 11/03/2021  Last Visit: 05/05/2021  Last Fill: 04/18/2021  DX:  Idiopathic chronic gout of multiple sites without tophus   Current Dose per office note 05/05/2021: allopurinol 300 mg 1 tablet by mouth daily  Labs: 5/`19/2022, CBC is normal, creatinine is mildly elevated.  Please advise patient to avoid NSAIDs.  We will continue to monitor.  Uric acid is 3.5 which is in thedesirable range.  Okay to refill Allopurinol?

## 2021-07-19 ENCOUNTER — Other Ambulatory Visit: Payer: Self-pay | Admitting: Cardiovascular Disease

## 2021-07-25 DIAGNOSIS — F438 Other reactions to severe stress: Secondary | ICD-10-CM | POA: Diagnosis not present

## 2021-07-27 DIAGNOSIS — R972 Elevated prostate specific antigen [PSA]: Secondary | ICD-10-CM | POA: Diagnosis not present

## 2021-07-27 DIAGNOSIS — C61 Malignant neoplasm of prostate: Secondary | ICD-10-CM | POA: Diagnosis not present

## 2021-07-29 DIAGNOSIS — C61 Malignant neoplasm of prostate: Secondary | ICD-10-CM | POA: Diagnosis not present

## 2021-08-01 DIAGNOSIS — F438 Other reactions to severe stress: Secondary | ICD-10-CM | POA: Diagnosis not present

## 2021-08-09 DIAGNOSIS — F438 Other reactions to severe stress: Secondary | ICD-10-CM | POA: Diagnosis not present

## 2021-08-16 DIAGNOSIS — N400 Enlarged prostate without lower urinary tract symptoms: Secondary | ICD-10-CM | POA: Diagnosis not present

## 2021-08-16 DIAGNOSIS — C61 Malignant neoplasm of prostate: Secondary | ICD-10-CM | POA: Diagnosis not present

## 2021-08-17 DIAGNOSIS — F438 Other reactions to severe stress: Secondary | ICD-10-CM | POA: Diagnosis not present

## 2021-08-26 DIAGNOSIS — F438 Other reactions to severe stress: Secondary | ICD-10-CM | POA: Diagnosis not present

## 2021-08-29 DIAGNOSIS — Z23 Encounter for immunization: Secondary | ICD-10-CM | POA: Diagnosis not present

## 2021-08-30 DIAGNOSIS — Z23 Encounter for immunization: Secondary | ICD-10-CM | POA: Diagnosis not present

## 2021-09-08 DIAGNOSIS — Z191 Hormone sensitive malignancy status: Secondary | ICD-10-CM | POA: Diagnosis not present

## 2021-09-08 DIAGNOSIS — C61 Malignant neoplasm of prostate: Secondary | ICD-10-CM | POA: Diagnosis not present

## 2021-09-29 DIAGNOSIS — C61 Malignant neoplasm of prostate: Secondary | ICD-10-CM | POA: Diagnosis not present

## 2021-10-10 DIAGNOSIS — M6250 Muscle wasting and atrophy, not elsewhere classified, unspecified site: Secondary | ICD-10-CM | POA: Diagnosis not present

## 2021-10-10 DIAGNOSIS — M519 Unspecified thoracic, thoracolumbar and lumbosacral intervertebral disc disorder: Secondary | ICD-10-CM | POA: Diagnosis not present

## 2021-10-10 DIAGNOSIS — M5416 Radiculopathy, lumbar region: Secondary | ICD-10-CM | POA: Diagnosis not present

## 2021-10-12 ENCOUNTER — Other Ambulatory Visit: Payer: Self-pay | Admitting: Physician Assistant

## 2021-10-12 DIAGNOSIS — M6250 Muscle wasting and atrophy, not elsewhere classified, unspecified site: Secondary | ICD-10-CM | POA: Diagnosis not present

## 2021-10-12 DIAGNOSIS — M519 Unspecified thoracic, thoracolumbar and lumbosacral intervertebral disc disorder: Secondary | ICD-10-CM | POA: Diagnosis not present

## 2021-10-12 DIAGNOSIS — M5416 Radiculopathy, lumbar region: Secondary | ICD-10-CM | POA: Diagnosis not present

## 2021-10-12 NOTE — Telephone Encounter (Signed)
Next Visit: 11/03/2021   Last Visit: 05/05/2021   Last Fill: 07/15/2021  DX:  Idiopathic chronic gout of multiple sites without tophus    Current Dose per office note 05/05/2021: allopurinol 300 mg 1 tablet by mouth daily   Labs: 05/05/2021, CBC is normal, creatinine is mildly elevated.  Please advise patient to avoid NSAIDs.  We will continue to monitor.  Uric acid is 3.5 which is in thedesirable range.   Okay to refill Allopurinol?

## 2021-10-17 DIAGNOSIS — M6250 Muscle wasting and atrophy, not elsewhere classified, unspecified site: Secondary | ICD-10-CM | POA: Diagnosis not present

## 2021-10-17 DIAGNOSIS — M519 Unspecified thoracic, thoracolumbar and lumbosacral intervertebral disc disorder: Secondary | ICD-10-CM | POA: Diagnosis not present

## 2021-10-17 DIAGNOSIS — M5416 Radiculopathy, lumbar region: Secondary | ICD-10-CM | POA: Diagnosis not present

## 2021-10-19 ENCOUNTER — Ambulatory Visit (INDEPENDENT_AMBULATORY_CARE_PROVIDER_SITE_OTHER): Payer: Medicare Other | Admitting: Physician Assistant

## 2021-10-19 ENCOUNTER — Other Ambulatory Visit: Payer: Self-pay

## 2021-10-19 ENCOUNTER — Encounter: Payer: Self-pay | Admitting: Physician Assistant

## 2021-10-19 VITALS — BP 148/90 | HR 64 | Ht 72.0 in | Wt 211.2 lb

## 2021-10-19 DIAGNOSIS — M19042 Primary osteoarthritis, left hand: Secondary | ICD-10-CM

## 2021-10-19 DIAGNOSIS — Z5181 Encounter for therapeutic drug level monitoring: Secondary | ICD-10-CM

## 2021-10-19 DIAGNOSIS — G629 Polyneuropathy, unspecified: Secondary | ICD-10-CM

## 2021-10-19 DIAGNOSIS — M170 Bilateral primary osteoarthritis of knee: Secondary | ICD-10-CM

## 2021-10-19 DIAGNOSIS — M65341 Trigger finger, right ring finger: Secondary | ICD-10-CM | POA: Diagnosis not present

## 2021-10-19 DIAGNOSIS — S76112S Strain of left quadriceps muscle, fascia and tendon, sequela: Secondary | ICD-10-CM | POA: Diagnosis not present

## 2021-10-19 DIAGNOSIS — M19041 Primary osteoarthritis, right hand: Secondary | ICD-10-CM

## 2021-10-19 DIAGNOSIS — M19071 Primary osteoarthritis, right ankle and foot: Secondary | ICD-10-CM | POA: Diagnosis not present

## 2021-10-19 DIAGNOSIS — S065XAA Traumatic subdural hemorrhage with loss of consciousness status unknown, initial encounter: Secondary | ICD-10-CM

## 2021-10-19 DIAGNOSIS — Z952 Presence of prosthetic heart valve: Secondary | ICD-10-CM

## 2021-10-19 DIAGNOSIS — A809 Acute poliomyelitis, unspecified: Secondary | ICD-10-CM

## 2021-10-19 DIAGNOSIS — S76111S Strain of right quadriceps muscle, fascia and tendon, sequela: Secondary | ICD-10-CM | POA: Diagnosis not present

## 2021-10-19 DIAGNOSIS — M65351 Trigger finger, right little finger: Secondary | ICD-10-CM | POA: Diagnosis not present

## 2021-10-19 DIAGNOSIS — M19072 Primary osteoarthritis, left ankle and foot: Secondary | ICD-10-CM

## 2021-10-19 DIAGNOSIS — M1A09X0 Idiopathic chronic gout, multiple sites, without tophus (tophi): Secondary | ICD-10-CM | POA: Diagnosis not present

## 2021-10-19 DIAGNOSIS — I2510 Atherosclerotic heart disease of native coronary artery without angina pectoris: Secondary | ICD-10-CM

## 2021-10-19 NOTE — Patient Instructions (Signed)

## 2021-10-19 NOTE — Progress Notes (Signed)
Office Visit Note  Patient: Jose [name]             Date of Birth: 1951-04-04           MRN: [id number]             PCP: [name], MD Referring: [name], MD Visit Date: 10/19/2021 Occupation: @GUAROCC @  Subjective:  Medication monitoring   History of Present Illness: Jose [name] is a 70 y.o. male with history of gout and osteoarthritis.  He continues to take allopurinol 300 mg daily and colchicine 0.6 mg as needed during gout flares.  He states that he has not needed to take colchicine in about 8 months.  He states he occasionally has twinges of pain consistent with early signs of a gout flare in his great toe which typically is self resolving.  He tries to avoid all shellfish and has been eating other types of fish several days per week.  He occasionally has a martini or a glass of red wine.  He denies any increased joint pain or joint swelling at this time.  He continues to go to the gym 2 to 3 days/week and works on lower extremity muscle strengthening.  He recently went to physical therapy which has improved his range of motion in both knees as well as overall joint stiffness.  He denies any joint swelling at this time.  He takes tramadol 50 mg 1-2 tablets every 4 hours as needed for pain relief.  He states that the right ring trigger finger has resolved.  He has occasional tenderness especially with direct pressure on the palmar aspect of his right hand.  He denies any other new concerns or questions at this time.   Activities of Daily Living:  Patient reports morning stiffness for several hours.   Patient Reports nocturnal pain.  Difficulty dressing/grooming: Denies Difficulty climbing stairs: Reports Difficulty getting out of chair: Denies Difficulty using hands for taps, buttons, cutlery, and/or writing: Denies  Review of Systems  Constitutional:  Negative for fatigue.  HENT:  Negative for mouth sores, mouth dryness and nose dryness.   Eyes:  Negative for  pain, itching and dryness.  Respiratory:  Negative for shortness of breath and difficulty breathing.   Cardiovascular:  Negative for chest pain and palpitations.  Gastrointestinal:  Negative for blood in stool, constipation and diarrhea.  Endocrine: Negative for increased urination.  Genitourinary:  Negative for difficulty urinating.  Musculoskeletal:  Positive for joint pain, joint pain, joint swelling and morning stiffness. Negative for myalgias, muscle tenderness and myalgias.  Skin:  Negative for color change, rash and redness.  Allergic/Immunologic: Negative for susceptible to infections.  Neurological:  Negative for dizziness, numbness, headaches, memory loss and weakness.  Hematological:  Negative for bruising/bleeding tendency.  Psychiatric/Behavioral:  Negative for confusion.    PMFS History:  Patient Active Problem List   Diagnosis Date Noted   Hypertension    Severe aortic stenosis 10/08/2019   Candidiasis    Abnormality of gait    Hypoalbuminemia due to protein-calorie malnutrition [facility])    History of gout    Sleep disturbance    Constipation    Rupture of quadriceps tendon, right, sequela 06/19/2017   Quadriceps tendon rupture, left, sequela 06/19/2017   Acute blood loss anemia 06/19/2017   Fall    History of subdural hematoma    Post-operative pain    Recurrent falls    Quadriceps tendon rupture 06/15/2017   Primary osteoarthritis of both knees 02/28/2017  Primary osteoarthritis of both hands 02/28/2017   Uricacidemia 02/28/2017   Pain in joint of left knee 02/07/2017   Idiopathic chronic gout, unspecified site, without tophus (tophi) 11/29/2016   HEMATURIA UNSPECIFIED 01/25/2009   ABDOMINAL PAIN 01/25/2009   XERODERMA 03/10/2008   UNS ADVRS EFF UNS RX MEDICINAL&BIOLOGICAL SBSTNC 03/10/2008   ADJUSTMENT REACTION WITH PHYSICAL SYMPTOMS 02/14/2008   MUSCLE SPASM 02/14/2008   ACUTE PROSTATITIS 12/16/2007   BREAST LUMP OR MASS, RIGHT 07/12/2007   H/O: RCT  (rotator cuff tear) 01/13/2007   GOUT 01/08/2007    Past Medical History:  Diagnosis Date   Arthritis    R shoulder, great toes, hands- has gout & has injections in knees with Dr. Estanislado Pandy    Bicuspid aortic valve with ascending aorta 4.0 to 4.5 cm in diameter    Cancer (HCC)    skin ca-    GERD (gastroesophageal reflux disease)    Headache    History of hiatal hernia    Hypertension    SDH (subdural hematoma)    Severe aortic stenosis    Vertebral artery aneurysm (HCC)     Family History  Problem Relation Age of Onset   Parkinson's disease Mother    Heart disease Father    Parkinson's disease Brother    Diabetes Brother    Past Surgical History:  Procedure Laterality Date   BRAIN SURGERY  2016   evacuation of SDH   CARDIAC CATHETERIZATION     CARPAL TUNNEL RELEASE Bilateral    GREEN LIGHT LASER TURP (TRANSURETHRAL RESECTION OF PROSTATE  04/27/2017   KNEE ARTHROPLASTY     LEFT HEART CATH AND CORONARY ANGIOGRAPHY N/A 10/08/2019   Procedure: LEFT HEART CATH AND CORONARY ANGIOGRAPHY;  Surgeon: Sherren Mocha, MD;  Location: [place] [facility];  Service: Cardiovascular;  Laterality: N/A;   QUADRICEPS TENDON REPAIR Bilateral 06/15/2017   Procedure: REPAIR QUADRICEP TENDON;  Surgeon: Meredith Pel, MD;  Location: [facility];  Service: Orthopedics;  Laterality: Bilateral;   SHOULDER SURGERY Right    TRANSCATHETER AORTIC VALVE REPLACEMENT, TRANSFEMORAL  12/23/2019   TRANSCATHETER AORTIC VALVE REPLACEMENT, TRANSFEMORAL N/A 12/23/2019   Procedure: TRANSCATHETER AORTIC VALVE REPLACEMENT, TRANSFEMORAL;  Surgeon: Sherren Mocha, MD;  Location: [place];  Service: Open Heart Surgery;  Laterality: N/A;   VASCULAR SURGERY     Social History   Social History Narrative   Not on file   Immunization History  Administered Date(s) Administered   PFIZER(Purple Top)SARS-COV-2 Vaccination 02/19/2020, 03/17/2020, 10/25/2020     Objective: Vital Signs: BP (!) 148/90 (BP Location: Left Arm,  Patient Position: Sitting, Cuff Size: Normal)   Pulse 64   Ht 6' (1.829 m)   Wt 211 lb 3.2 oz (95.8 kg)   BMI 28.64 kg/m    Physical Exam Vitals and nursing note reviewed.  Constitutional:      Appearance: He is well-developed.  HENT:     Head: Normocephalic and atraumatic.  Eyes:     Conjunctiva/sclera: Conjunctivae normal.     Pupils: Pupils are equal, round, and reactive to light.  Pulmonary:     Effort: Pulmonary effort is normal.  Abdominal:     Palpations: Abdomen is soft.  Musculoskeletal:     Cervical back: Normal range of motion and neck supple.  Skin:    General: Skin is warm and dry.     Capillary Refill: Capillary refill takes less than 2 seconds.  Neurological:     Mental Status: He is alert and oriented to person, place, and  time.  Psychiatric:        Behavior: Behavior normal.     Musculoskeletal Exam: C-spine has good ROM.  Limited ROM of both shoulder joints, especially the left shoulder to about 90 degrees of abduction.  Elbow joints and wrist joints have good ROM with no tenderness or inflammation.   PIP and DIP thickening consistent with osteoarthritis of both hands. Hip joints have good ROM with no discomfort.  Knee joints have good ROM with no discomfort.  Ankle joints have good ROM with no tenderness or joint swelling.    CDAI Exam: CDAI Score: -- Patient Global: --; Provider Global: -- Swollen: --; Tender: -- Joint Exam 10/19/2021   No joint exam has been documented for this visit   There is currently no information documented on the homunculus. Go to the Rheumatology activity and complete the homunculus joint exam.  Investigation: No additional findings.  Imaging: No results found.  Recent Labs: Lab Results  Component Value Date   WBC 7.8 05/05/2021   HGB 14.2 05/05/2021   PLT 236 05/05/2021   NA 144 05/31/2021   K 4.0 05/31/2021   CL 105 05/31/2021   CO2 22 05/31/2021   GLUCOSE 65 05/31/2021   BUN 18 05/31/2021   CREATININE 1.10  05/31/2021   BILITOT 0.3 05/05/2021   ALKPHOS 82 10/20/2020   AST 23 05/05/2021   ALT 27 05/05/2021   PROT 6.2 05/05/2021   ALBUMIN 4.4 10/20/2020   CALCIUM 9.4 05/31/2021   GFRAA 70 05/05/2021    Speciality Comments: No specialty comments available.  Procedures:  No procedures performed Allergies: Ciprofloxacin and Quinolones   Assessment / Plan:     Visit Diagnoses: Idiopathic chronic gout of multiple sites without tophus - He is clinically doing well taking allopurinol 300 mg 1 tablet by mouth daily and colchicine 0.6 mg 1 capsule by mouth daily as needed during flares.  He has not needed to take colchicine in over 8 months.  He has occasional twinges of pain in bilateral great toes consistent with early signs of a gout flare.  The symptoms are typically self resolving and do not require colchicine for symptomatic relief.  His symptoms are typically exacerbated by alcohol use.  He has been trying to avoid shellfish which has been a trigger in the past.  Discussed the importance of avoiding a purine rich diet.  He was given a handout about the gout diet to follow.  We also discussed the importance of remaining compliant taking allopurinol as prescribed.  His uric acid was 3.5 on 05/05/2021.  Uric acid, CBC, and CMP will be updated today.  He was advised to notify us if he develops signs or symptoms of a gout flare.  He will follow up in 6 months. - Plan: Uric acid, CBC with Differential/Platelet, COMPLETE METABOLIC PANEL WITH GFR  Medication monitoring encounter -CBC, CMP, and uric acid level will be checked today.  He will continue to require lab work every 6 months to monitor for drug toxicity.  Plan: Uric acid, CBC with Differential/Platelet, COMPLETE METABOLIC PANEL WITH GFR  Primary osteoarthritis of both hands: He has PIP and DIP thickening consistent with osteoarthritis of both hands.  Discussed the importance of joint protection and muscle strengthening.  He was strongly encouraged to  avoid overuse activities.  Primary osteoarthritis of both knees: He has good range of motion in both knee joints on examination today.  No warmth or effusion was noted.  He goes to the gym 2 to  3 days/week and works on her extremity muscle strengthening.  Primary osteoarthritis of both feet: He is not experiencing any discomfort in his feet at this time.  He has good range of motion of both ankle joints with no tenderness or joint swelling.  He wears proper fitting shoes.  Neuropathy  Trigger finger, right ring finger: Resolved.  He had a cortisone injection on 05/05/21.    Trigger little finger of right hand: Resolved.  He had a cortisone injection on 08/31/20.  Quadriceps tendon rupture, left, sequela - He had a left revision quadriceps tendon reconstruction on 07/02/19 performed by Dr. Elisabeth Most.  Good range of motion of the left knee joint with no discomfort.  No warmth or effusion was noted.  He recently went to physical therapy to work on lower extremity muscle strengthening and range of motion exercises.  He continues to go to the gym 2 to 3 days/week for lower extremity muscle strengthening and performs stretching exercises on a daily basis.  Rupture of quadriceps tendon, right, sequela - Repaired by Dr. Marlou Sa in the past.  He has good range of motion with no discomfort at this time.  No warmth or effusion was noted.  He performs daily stretching exercises and goes to the gym 2 to 3 days a week for strengthening exercises.  Other medical conditions are listed as follows:   Subdural hematoma  H/O aortic valve replacement - Performed on 12/23/2019 by Dr. Burt Knack.  He continues to follow-up with Dr. Johnsie Cancel.  [place]  Orders: Orders Placed This Encounter  Procedures   Uric acid   CBC with Differential/Platelet   COMPLETE METABOLIC PANEL WITH GFR    No orders of the defined types were placed in this encounter.    Follow-Up Instructions: Return in about 6 months (around 04/18/2022) for Gout,  Osteoarthritis.   Ofilia Neas, PA-C  Note - This record has been created using Dragon software.  Chart creation errors have been sought, but may not always  have been located. Such creation errors do not reflect on  the standard of medical care.

## 2021-10-20 LAB — COMPLETE METABOLIC PANEL WITH GFR
AG Ratio: 1.7 (calc) (ref 1.0–2.5)
ALT: 34 U/L (ref 9–46)
AST: 28 U/L (ref 10–35)
Albumin: 4.1 g/dL (ref 3.6–5.1)
Alkaline phosphatase (APISO): 62 U/L (ref 35–144)
BUN: 22 mg/dL (ref 7–25)
CO2: 26 mmol/L (ref 20–32)
Calcium: 9.6 mg/dL (ref 8.6–10.3)
Chloride: 106 mmol/L (ref 98–110)
Creat: 1.21 mg/dL (ref 0.70–1.28)
Globulin: 2.4 g/dL (calc) (ref 1.9–3.7)
Glucose, Bld: 96 mg/dL (ref 65–99)
Potassium: 4.5 mmol/L (ref 3.5–5.3)
Sodium: 142 mmol/L (ref 135–146)
Total Bilirubin: 0.6 mg/dL (ref 0.2–1.2)
Total Protein: 6.5 g/dL (ref 6.1–8.1)
eGFR: 64 mL/min/{1.73_m2} (ref >=60)

## 2021-10-20 LAB — CBC WITH DIFFERENTIAL/PLATELET
Absolute Monocytes: 723 cells/uL (ref 200–950)
Basophils Absolute: 60 cells/uL (ref 0–200)
Basophils Relative: 0.7 %
Eosinophils Absolute: 111 cells/uL (ref 15–500)
Eosinophils Relative: 1.3 %
HCT: 44.7 % (ref 38.5–50.0)
Hemoglobin: 15.2 g/dL (ref 13.2–17.1)
Lymphs Abs: 1462 cells/uL (ref 850–3900)
MCH: 32.1 pg (ref 27.0–33.0)
MCHC: 34 g/dL (ref 32.0–36.0)
MCV: 94.5 fL (ref 80.0–100.0)
MPV: 12 fL (ref 7.5–12.5)
Monocytes Relative: 8.5 %
Neutro Abs: 6146 cells/uL (ref 1500–7800)
Neutrophils Relative %: 72.3 %
Platelets: 177 10*3/uL (ref 140–400)
RBC: 4.73 10*6/uL (ref 4.20–5.80)
RDW: 13.3 % (ref 11.0–15.0)
Total Lymphocyte: 17.2 %
WBC: 8.5 10*3/uL (ref 3.8–10.8)

## 2021-10-20 LAB — URIC ACID: Uric Acid, Serum: 4.2 mg/dL (ref 4.0–8.0)

## 2021-10-20 NOTE — Progress Notes (Signed)
CBC and CMP WNL.  Uric acid is within the desirable range.

## 2021-10-21 DIAGNOSIS — M5416 Radiculopathy, lumbar region: Secondary | ICD-10-CM | POA: Diagnosis not present

## 2021-10-21 DIAGNOSIS — M519 Unspecified thoracic, thoracolumbar and lumbosacral intervertebral disc disorder: Secondary | ICD-10-CM | POA: Diagnosis not present

## 2021-10-21 DIAGNOSIS — M6250 Muscle wasting and atrophy, not elsewhere classified, unspecified site: Secondary | ICD-10-CM | POA: Diagnosis not present

## 2021-10-25 DIAGNOSIS — M519 Unspecified thoracic, thoracolumbar and lumbosacral intervertebral disc disorder: Secondary | ICD-10-CM | POA: Diagnosis not present

## 2021-10-25 DIAGNOSIS — M5416 Radiculopathy, lumbar region: Secondary | ICD-10-CM | POA: Diagnosis not present

## 2021-10-25 DIAGNOSIS — M6250 Muscle wasting and atrophy, not elsewhere classified, unspecified site: Secondary | ICD-10-CM | POA: Diagnosis not present

## 2021-10-27 ENCOUNTER — Other Ambulatory Visit: Payer: Self-pay | Admitting: Cardiovascular Disease

## 2021-10-27 NOTE — Telephone Encounter (Signed)
I called the patient to discuss recent lab results in detail.  Discussed that [facility] has changed the normal reference range so his creatinine is WNL and GFR is WNL despite the creatinine being slightly higher than last time.  Discussed that tylenol is ok to take PRN.  He should try to avoid excessive use of NSAIDs.   All questions were addressed.  We will continue to monitor lab work closely.

## 2021-10-28 DIAGNOSIS — M6250 Muscle wasting and atrophy, not elsewhere classified, unspecified site: Secondary | ICD-10-CM | POA: Diagnosis not present

## 2021-10-28 DIAGNOSIS — M519 Unspecified thoracic, thoracolumbar and lumbosacral intervertebral disc disorder: Secondary | ICD-10-CM | POA: Diagnosis not present

## 2021-10-28 DIAGNOSIS — M5416 Radiculopathy, lumbar region: Secondary | ICD-10-CM | POA: Diagnosis not present

## 2021-10-31 DIAGNOSIS — M6250 Muscle wasting and atrophy, not elsewhere classified, unspecified site: Secondary | ICD-10-CM | POA: Diagnosis not present

## 2021-10-31 DIAGNOSIS — M519 Unspecified thoracic, thoracolumbar and lumbosacral intervertebral disc disorder: Secondary | ICD-10-CM | POA: Diagnosis not present

## 2021-10-31 DIAGNOSIS — M5416 Radiculopathy, lumbar region: Secondary | ICD-10-CM | POA: Diagnosis not present

## 2021-11-02 DIAGNOSIS — M6250 Muscle wasting and atrophy, not elsewhere classified, unspecified site: Secondary | ICD-10-CM | POA: Diagnosis not present

## 2021-11-02 DIAGNOSIS — M5416 Radiculopathy, lumbar region: Secondary | ICD-10-CM | POA: Diagnosis not present

## 2021-11-02 DIAGNOSIS — Z5320 Procedure and treatment not carried out because of patient's decision for unspecified reasons: Secondary | ICD-10-CM | POA: Diagnosis not present

## 2021-11-02 DIAGNOSIS — Z79899 Other long term (current) drug therapy: Secondary | ICD-10-CM | POA: Diagnosis not present

## 2021-11-02 DIAGNOSIS — Z7982 Long term (current) use of aspirin: Secondary | ICD-10-CM | POA: Diagnosis not present

## 2021-11-02 DIAGNOSIS — R059 Cough, unspecified: Secondary | ICD-10-CM | POA: Diagnosis not present

## 2021-11-02 DIAGNOSIS — M519 Unspecified thoracic, thoracolumbar and lumbosacral intervertebral disc disorder: Secondary | ICD-10-CM | POA: Diagnosis not present

## 2021-11-03 ENCOUNTER — Ambulatory Visit: Payer: Medicare Other | Admitting: Rheumatology

## 2021-11-03 ENCOUNTER — Encounter: Payer: Self-pay | Admitting: Cardiovascular Disease

## 2021-11-03 DIAGNOSIS — R059 Cough, unspecified: Secondary | ICD-10-CM | POA: Diagnosis not present

## 2021-11-07 DIAGNOSIS — M519 Unspecified thoracic, thoracolumbar and lumbosacral intervertebral disc disorder: Secondary | ICD-10-CM | POA: Diagnosis not present

## 2021-11-07 DIAGNOSIS — M5416 Radiculopathy, lumbar region: Secondary | ICD-10-CM | POA: Diagnosis not present

## 2021-11-07 DIAGNOSIS — M6250 Muscle wasting and atrophy, not elsewhere classified, unspecified site: Secondary | ICD-10-CM | POA: Diagnosis not present

## 2021-11-08 ENCOUNTER — Encounter: Payer: Self-pay | Admitting: Rheumatology

## 2021-11-08 NOTE — Telephone Encounter (Signed)
Spoke with patient and scheduled patient for 11/16/2021 at 3:30 pm. Patient advised we are working him in and he may have a wait. Patient expressed understanding.

## 2021-11-14 DIAGNOSIS — M6250 Muscle wasting and atrophy, not elsewhere classified, unspecified site: Secondary | ICD-10-CM | POA: Diagnosis not present

## 2021-11-14 DIAGNOSIS — M5416 Radiculopathy, lumbar region: Secondary | ICD-10-CM | POA: Diagnosis not present

## 2021-11-14 DIAGNOSIS — M519 Unspecified thoracic, thoracolumbar and lumbosacral intervertebral disc disorder: Secondary | ICD-10-CM | POA: Diagnosis not present

## 2021-11-16 ENCOUNTER — Ambulatory Visit: Payer: Self-pay

## 2021-11-16 ENCOUNTER — Other Ambulatory Visit: Payer: Self-pay

## 2021-11-16 ENCOUNTER — Encounter: Payer: Self-pay | Admitting: Rheumatology

## 2021-11-16 ENCOUNTER — Ambulatory Visit (INDEPENDENT_AMBULATORY_CARE_PROVIDER_SITE_OTHER): Payer: Medicare Other | Admitting: Rheumatology

## 2021-11-16 VITALS — BP 133/83 | HR 64 | Ht 72.0 in | Wt 217.0 lb

## 2021-11-16 DIAGNOSIS — M19041 Primary osteoarthritis, right hand: Secondary | ICD-10-CM | POA: Diagnosis not present

## 2021-11-16 DIAGNOSIS — M19042 Primary osteoarthritis, left hand: Secondary | ICD-10-CM

## 2021-11-16 DIAGNOSIS — I2510 Atherosclerotic heart disease of native coronary artery without angina pectoris: Secondary | ICD-10-CM

## 2021-11-16 MED ORDER — LIDOCAINE HCL 1 % IJ SOLN
0.5000 mL | INTRAMUSCULAR | Status: AC | PRN
  Administered 2021-11-16: .5 mL

## 2021-11-16 MED ORDER — TRIAMCINOLONE ACETONIDE 40 MG/ML IJ SUSP
10.0000 mg | INTRAMUSCULAR | Status: AC | PRN
Start: 2021-11-16 — End: 2021-11-16
  Administered 2021-11-16: 10 mg via INTRA_ARTICULAR

## 2021-11-16 NOTE — Progress Notes (Signed)
Office Visit Note  Patient: Jose [name]             Date of Birth: 10-02-1951           MRN: [id number]             PCP: [name], MD Referring: [name], MD Visit Date: 11/16/2021 Occupation: @GUAROCC @  Subjective:  Pain in right ring finger   History of Present Illness: Jose [name] is a 70 y.o. male with a history of gout and osteoarthritis.  He states for the last 2 weeks he has been having increased pain and discomfort in his right ring finger.  He denies triggering of the right ring finger.  He had injection in his right trigger finger on August 31, 2020 and A1 ring and May 05, 2021 in the A3 ring.  None of the other joints are painful.  Activities of Daily Living:  Patient reports morning stiffness for 0 minutes.   Patient Denies nocturnal pain.  Difficulty dressing/grooming: Denies Difficulty climbing stairs: Reports Difficulty getting out of chair: Denies Difficulty using hands for taps, buttons, cutlery, and/or writing: Denies  Review of Systems  Constitutional:  Negative for fatigue.  HENT:  Negative for mouth sores, mouth dryness and nose dryness.   Eyes:  Negative for pain, itching and dryness.  Respiratory:  Negative for shortness of breath and difficulty breathing.   Cardiovascular:  Negative for chest pain and palpitations.  Gastrointestinal:  Negative for blood in stool, constipation and diarrhea.  Endocrine: Negative for increased urination.  Genitourinary:  Negative for difficulty urinating.  Musculoskeletal:  Negative for joint pain, joint pain, joint swelling, myalgias, morning stiffness, muscle tenderness and myalgias.  Skin:  Negative for color change, rash and redness.  Allergic/Immunologic: Negative for susceptible to infections.  Neurological:  Negative for dizziness, numbness, headaches, memory loss and weakness.  Hematological:  Negative for bruising/bleeding tendency.   PMFS History:  Patient Active Problem List    Diagnosis Date Noted   Hypertension    Severe aortic stenosis 10/08/2019   Candidiasis    Abnormality of gait    Hypoalbuminemia due to protein-calorie malnutrition [facility])    History of gout    Sleep disturbance    Constipation    Rupture of quadriceps tendon, right, sequela 06/19/2017   Quadriceps tendon rupture, left, sequela 06/19/2017   Acute blood loss anemia 06/19/2017   Fall    History of subdural hematoma    Post-operative pain    Recurrent falls    Quadriceps tendon rupture 06/15/2017   Primary osteoarthritis of both knees 02/28/2017   Primary osteoarthritis of both hands 02/28/2017   Uricacidemia 02/28/2017   Pain in joint of left knee 02/07/2017   Idiopathic chronic gout, unspecified site, without tophus (tophi) 11/29/2016   HEMATURIA UNSPECIFIED 01/25/2009   ABDOMINAL PAIN 01/25/2009   XERODERMA 03/10/2008   UNS ADVRS EFF UNS RX MEDICINAL&BIOLOGICAL SBSTNC 03/10/2008   ADJUSTMENT REACTION WITH PHYSICAL SYMPTOMS 02/14/2008   MUSCLE SPASM 02/14/2008   ACUTE PROSTATITIS 12/16/2007   BREAST LUMP OR MASS, RIGHT 07/12/2007   H/O: RCT (rotator cuff tear) 01/13/2007   GOUT 01/08/2007    Past Medical History:  Diagnosis Date   Arthritis    R shoulder, great toes, hands- has gout & has injections in knees with Dr. Estanislado Pandy    Bicuspid aortic valve with ascending aorta 4.0 to 4.5 cm in diameter    Cancer ([place])    skin ca-    GERD (gastroesophageal reflux  disease)    Headache    History of hiatal hernia    Hypertension    SDH (subdural hematoma)    Severe aortic stenosis    Vertebral artery aneurysm (HCC)     Family History  Problem Relation Age of Onset   Parkinson's disease Mother    Heart disease Father    Parkinson's disease Brother    Diabetes Brother    Past Surgical History:  Procedure Laterality Date   BRAIN SURGERY  2016   evacuation of SDH   CARDIAC CATHETERIZATION     CARPAL TUNNEL RELEASE Bilateral    GREEN LIGHT LASER TURP (TRANSURETHRAL  RESECTION OF PROSTATE  04/27/2017   KNEE ARTHROPLASTY     LEFT HEART CATH AND CORONARY ANGIOGRAPHY N/A 10/08/2019   Procedure: LEFT HEART CATH AND CORONARY ANGIOGRAPHY;  Surgeon: Sherren Mocha, MD;  Location: [place] [facility];  Service: Cardiovascular;  Laterality: N/A;   QUADRICEPS TENDON REPAIR Bilateral 06/15/2017   Procedure: REPAIR QUADRICEP TENDON;  Surgeon: Meredith Pel, MD;  Location: [place];  Service: Orthopedics;  Laterality: Bilateral;   SHOULDER SURGERY Right    TRANSCATHETER AORTIC VALVE REPLACEMENT, TRANSFEMORAL  12/23/2019   TRANSCATHETER AORTIC VALVE REPLACEMENT, TRANSFEMORAL N/A 12/23/2019   Procedure: TRANSCATHETER AORTIC VALVE REPLACEMENT, TRANSFEMORAL;  Surgeon: Sherren Mocha, MD;  Location: [facility];  Service: Open Heart Surgery;  Laterality: N/A;   VASCULAR SURGERY     Social History   Social History Narrative   Not on file   Immunization History  Administered Date(s) Administered   PFIZER(Purple Top)SARS-COV-2 Vaccination 02/19/2020, 03/17/2020, 10/25/2020     Objective: Vital Signs: BP 133/83 (BP Location: Left Arm, Patient Position: Sitting, Cuff Size: Large)   Pulse 64   Ht 6' (1.829 m)   Wt 217 lb (98.4 kg)   BMI 29.43 kg/m    Physical Exam Vitals and nursing note reviewed.  Constitutional:      Appearance: He is well-developed.  HENT:     Head: Normocephalic and atraumatic.  Eyes:     Conjunctiva/sclera: Conjunctivae normal.     Pupils: Pupils are equal, round, and reactive to light.  Cardiovascular:     Rate and Rhythm: Normal rate and regular rhythm.     Heart sounds: Normal heart sounds.  Pulmonary:     Effort: Pulmonary effort is normal.     Breath sounds: Normal breath sounds.  Abdominal:     General: Bowel sounds are normal.     Palpations: Abdomen is soft.  Musculoskeletal:     Cervical back: Normal range of motion and neck supple.  Skin:    General: Skin is warm and dry.     Capillary Refill: Capillary refill takes less  than 2 seconds.  Neurological:     Mental Status: He is alert and oriented to person, place, and time.  Psychiatric:        Behavior: Behavior normal.     Musculoskeletal Exam: C-spine was in good range of motion.  Shoulder joints and elbow joints with good range of motion.  He had bilateral PIP and DIP thickening with no synovitis.  He had tenderness on palpation over his right fourth PIP joint.  Knee joints were in good range of motion.  There was no tenderness over ankles.  CDAI Exam: CDAI Score: -- Patient Global: --; Provider Global: -- Swollen: --; Tender: -- Joint Exam 11/16/2021   No joint exam has been documented for this visit   There is currently no information documented on  the homunculus. Go to the Rheumatology activity and complete the homunculus joint exam.  Investigation: No additional findings.  Imaging: US Guided Needle Placement  Result Date: 11/16/2021 Ultrasound guided injection is preferred based studies that show increased duration, increased effect, greater accuracy, decreased procedural pain, increased response rate, and decreased cost with ultrasound guided versus blind injection.   Verbal informed consent obtained.  Time-out conducted.  Noted no overlying erythema, induration, or other signs of local infection. Ultrasound-guided right fourth PIP joint injection: After sterile prep with Betadine, injected 0.5 mL of 1% lidocaine and 10 mg Kenalog using a 27-gauge needle, right fourth PIP joint.     Recent Labs: Lab Results  Component Value Date   WBC 8.5 10/19/2021   HGB 15.2 10/19/2021   PLT 177 10/19/2021   NA 142 10/19/2021   K 4.5 10/19/2021   CL 106 10/19/2021   CO2 26 10/19/2021   GLUCOSE 96 10/19/2021   BUN 22 10/19/2021   CREATININE 1.21 10/19/2021   BILITOT 0.6 10/19/2021   ALKPHOS 82 10/20/2020   AST 28 10/19/2021   ALT 34 10/19/2021   PROT 6.5 10/19/2021   ALBUMIN 4.4 10/20/2020   CALCIUM 9.6 10/19/2021   GFRAA 70 05/05/2021     Speciality Comments: No specialty comments available.  Procedures:  Small Joint Inj on 11/16/2021 4:24 PM Indications: pain and joint swelling Details: 27 G needle, ultrasound-guided dorsal approach  Spinal Needle: No  Medications: 0.5 mL lidocaine 1 %; 10 mg triamcinolone acetonide 40 MG/ML Aspirate: 0 mL Outcome: tolerated well, no immediate complications Procedure, treatment alternatives, risks and benefits explained, specific risks discussed. Consent was given by the patient. Immediately prior to procedure a time out was called to verify the correct patient, procedure, equipment, support staff and site/side marked as required. Patient was prepped and draped in the usual sterile fashion.    Allergies: Ciprofloxacin and Quinolones   Assessment / Plan:     Visit Diagnoses: Primary osteoarthritis of both hands -he has been having pain and discomfort in in his right fourth PIP joint.  He had mild inflammation on palpation.  Per his request after informed consent was obtained right hand fourth PIP joint was injected with lidocaine and cortisone as described above.  He tolerated the procedure well.  Postprocedure instructions were given and a splint was applied to be used for the next 3 days.  Plan: US Guided Needle Placement.  He was advised to contact us if the symptoms persist.  Orders: Orders Placed This Encounter  Procedures   Small Joint Inj   US Guided Needle Placement   No orders of the defined types were placed in this encounter.    Follow-Up Instructions: Return for Gout, Osteoarthritis.   Bo Merino, MD  Note - This record has been created using Editor, commissioning.  Chart creation errors have been sought, but may not always  have been located. Such creation errors do not reflect on  the standard of medical care.

## 2021-11-17 DIAGNOSIS — R059 Cough, unspecified: Secondary | ICD-10-CM | POA: Diagnosis not present

## 2021-11-17 DIAGNOSIS — L218 Other seborrheic dermatitis: Secondary | ICD-10-CM | POA: Diagnosis not present

## 2021-11-17 DIAGNOSIS — R7303 Prediabetes: Secondary | ICD-10-CM | POA: Diagnosis not present

## 2021-11-17 DIAGNOSIS — D485 Neoplasm of uncertain behavior of skin: Secondary | ICD-10-CM | POA: Diagnosis not present

## 2021-11-17 DIAGNOSIS — L538 Other specified erythematous conditions: Secondary | ICD-10-CM | POA: Diagnosis not present

## 2021-11-17 DIAGNOSIS — L820 Inflamed seborrheic keratosis: Secondary | ICD-10-CM | POA: Diagnosis not present

## 2021-11-17 DIAGNOSIS — L570 Actinic keratosis: Secondary | ICD-10-CM | POA: Diagnosis not present

## 2021-11-17 DIAGNOSIS — D225 Melanocytic nevi of trunk: Secondary | ICD-10-CM | POA: Diagnosis not present

## 2021-11-17 DIAGNOSIS — C61 Malignant neoplasm of prostate: Secondary | ICD-10-CM | POA: Diagnosis not present

## 2021-11-17 DIAGNOSIS — Z760 Encounter for issue of repeat prescription: Secondary | ICD-10-CM | POA: Diagnosis not present

## 2021-11-17 DIAGNOSIS — D235 Other benign neoplasm of skin of trunk: Secondary | ICD-10-CM | POA: Diagnosis not present

## 2021-11-17 DIAGNOSIS — L298 Other pruritus: Secondary | ICD-10-CM | POA: Diagnosis not present

## 2021-11-17 DIAGNOSIS — L821 Other seborrheic keratosis: Secondary | ICD-10-CM | POA: Diagnosis not present

## 2021-11-17 DIAGNOSIS — L578 Other skin changes due to chronic exposure to nonionizing radiation: Secondary | ICD-10-CM | POA: Diagnosis not present

## 2021-11-21 DIAGNOSIS — M5416 Radiculopathy, lumbar region: Secondary | ICD-10-CM | POA: Diagnosis not present

## 2021-11-21 DIAGNOSIS — M519 Unspecified thoracic, thoracolumbar and lumbosacral intervertebral disc disorder: Secondary | ICD-10-CM | POA: Diagnosis not present

## 2021-11-21 DIAGNOSIS — M6250 Muscle wasting and atrophy, not elsewhere classified, unspecified site: Secondary | ICD-10-CM | POA: Diagnosis not present

## 2021-11-23 DIAGNOSIS — M6250 Muscle wasting and atrophy, not elsewhere classified, unspecified site: Secondary | ICD-10-CM | POA: Diagnosis not present

## 2021-11-23 DIAGNOSIS — M519 Unspecified thoracic, thoracolumbar and lumbosacral intervertebral disc disorder: Secondary | ICD-10-CM | POA: Diagnosis not present

## 2021-11-23 DIAGNOSIS — L570 Actinic keratosis: Secondary | ICD-10-CM | POA: Diagnosis not present

## 2021-11-23 DIAGNOSIS — M5416 Radiculopathy, lumbar region: Secondary | ICD-10-CM | POA: Diagnosis not present

## 2021-11-23 DIAGNOSIS — D225 Melanocytic nevi of trunk: Secondary | ICD-10-CM | POA: Diagnosis not present

## 2021-11-28 DIAGNOSIS — M6250 Muscle wasting and atrophy, not elsewhere classified, unspecified site: Secondary | ICD-10-CM | POA: Diagnosis not present

## 2021-11-28 DIAGNOSIS — M5416 Radiculopathy, lumbar region: Secondary | ICD-10-CM | POA: Diagnosis not present

## 2021-11-28 DIAGNOSIS — M519 Unspecified thoracic, thoracolumbar and lumbosacral intervertebral disc disorder: Secondary | ICD-10-CM | POA: Diagnosis not present

## 2021-11-30 DIAGNOSIS — M5416 Radiculopathy, lumbar region: Secondary | ICD-10-CM | POA: Diagnosis not present

## 2021-11-30 DIAGNOSIS — M6250 Muscle wasting and atrophy, not elsewhere classified, unspecified site: Secondary | ICD-10-CM | POA: Diagnosis not present

## 2021-11-30 DIAGNOSIS — M519 Unspecified thoracic, thoracolumbar and lumbosacral intervertebral disc disorder: Secondary | ICD-10-CM | POA: Diagnosis not present

## 2021-12-13 ENCOUNTER — Other Ambulatory Visit: Payer: Self-pay | Admitting: Physician Assistant

## 2021-12-13 NOTE — Telephone Encounter (Signed)
Next Visit: 04/18/2022  Last Visit: 10/19/2021  Last Fill: 10/12/2021  DX: Idiopathic chronic gout of multiple sites without   Current Dose per office note 10/19/2021: allopurinol 300 mg 1 tablet by mouth daily   Labs: 10/19/2021 CBC and CMP WNL.  Uric acid is within the desirable range.   Okay to refill Allopurinol?

## 2021-12-24 ENCOUNTER — Encounter: Payer: Self-pay | Admitting: Cardiovascular Disease

## 2021-12-24 DIAGNOSIS — E782 Mixed hyperlipidemia: Secondary | ICD-10-CM

## 2021-12-24 DIAGNOSIS — I2510 Atherosclerotic heart disease of native coronary artery without angina pectoris: Secondary | ICD-10-CM

## 2021-12-26 ENCOUNTER — Encounter: Payer: Self-pay | Admitting: Physician Assistant

## 2021-12-26 NOTE — Progress Notes (Signed)
Cardiology Office Note    Date:  12/28/2021   ID:  Jose [name], DOB February 09, 1951, MRN [id number]  PCP:  [name], MD  Cardiologist:  [name], MD  Electrophysiologist:  None   Chief Complaint: f/u TAVR, CAD  History of Present Illness:   Jose [name] is a 71 y.o. male with history of bicuspid AV with severe AS s/p TAVR 12/2019, CAD, spontaneous CNS bleed with craniotomy 2016 in [place], left vertebral artery aneurysm (followed in [place]), ascending aortic aneurysm, prostate CA who presents to clinic for follow-up.   He has been followed for dilated ascending aorta and bicupid AV for many years. He developed progressive severe AS in 2020. Cath 10/2019 showed moderate eccentric proxima/mid LAD stenosis, severe stenosis of a moderate sized first diagonal branch, not suitable for PCI, patent left main, LCx, and RCA with mild nonobstructive plaque. This was managed medically. He ultimately underwent TAVR 12/2019. He was discharged on ASA + Plavix but Plavix later discontinued history of intracranial bleed. Last echo 05/2021 EF 60-65%, grade 1 DD, normal RV, mild perivalvular leak of TAVR with mean gradient 81mmHg, moderate dilation of ascending aorta 4.6cm. CT angio aorta 05/2021 showed unchanged size measuring 4.4cm, recommended for annual imaging.  He is seen back for follow-up overall doing well without any new cardiac symptoms. He goes to PT 2x/week for his hx of quadricep tear and chronic back issues and also goes to the gym at least 2x/week. He does the upper extremity bike, recumbent bike and walks for exercise. He does not report any CP, SOB, palpitations, or syncope. He had an echo earlier today with result pending. He also has scheduled f/u coming up in the near future for his left vertebral aneurysm with a team outside of [place] - he sees them yearly.  Labwork independently reviewed: 10/2021 CMET OK Cr 1.21, K 4.5, CBC wnl, LDL 76, trig 74   Cardiology Studies:    Studies reviewed are outlined and summarized above. Reports included below if pertinent.   2D Echo 12/28/21 pending  2D echo 05/2021  1. Left ventricular ejection fraction, by estimation, is 60 to 65%. The  left ventricle has normal function. The left ventricle has no regional  wall motion abnormalities. Left ventricular diastolic parameters are  consistent with Grade I diastolic  dysfunction (impaired relaxation).   2. Right ventricular systolic function is normal. The right ventricular  size is normal. There is normal pulmonary artery systolic pressure. The  estimated right ventricular systolic pressure is 40.9 mmHg.   3. The mitral valve is grossly normal. No evidence of mitral valve  regurgitation.   4. The aortic valve has been repaired/replaced. Aortic valve  regurgitation is trivial. There is a 26 mm Edwards Sapien prosthetic  (TAVR) valve present in the aortic position. Procedure Date: 12/23/2019.  Echo findings are consistent with perivalvular leak  of the aortic prosthesis. Aortic regurgitation PHT measures 829 msec.  Aortic valve area, by VTI measures 1.38 cm. Aortic valve mean gradient  measures 22.0 mmHg. Aortic valve Vmax measures 3.10 m/s.   5. Aortic dilatation noted. There is moderate dilatation of the ascending  aorta, measuring 46 mm.   6. The inferior vena cava is normal in size with greater than 50%  respiratory variability, suggesting right atrial pressure of 3 mmHg.   Comparison(s): Changes from prior study are noted. 12/22/2020: LVEF 60-65%,  TAVR valve, mild perivalvular leak, mean gradient 14 mmHg.   CT 05/2021 FINDINGS: Cardiovascular:   Heart:  Surgical changes of prior TAVR. Heart size unchanged. No pericardial fluid/thickening. No significant calcifications of the coronary arteries on the current non gated CT.   Aorta:   Changes of prior TAVR. Ascending aorta estimated 4.4 cm maximum dimension. Mild atherosclerosis of the aortic arch. No  dissection. No pedunculated plaque or ulcerated plaque. No periaortic fluid.   Three vessel arch. Cervical cerebral vessels patent at the base of the neck.   Incidental imaging of ectasias of the celiac artery axis, without significant atherosclerotic changes. These may be related to poststenotic dilation/irregularity for chronic non flow limiting dissection as the branch vessels are patent.   Pulmonary arteries:   Timing of the contrast bolus is not optimized for evaluation of pulmonary artery filling defects.   Mediastinum/Nodes: No mediastinal adenopathy. Unremarkable appearance of the thoracic esophagus.   Unremarkable appearance of the thoracic inlet.   Lungs/Pleura: Central airways are clear. No pleural effusion. No confluent airspace disease.   No pneumothorax.   Upper Abdomen: No acute.   Musculoskeletal: No acute displaced fracture. Degenerative changes of the spine.   Review of the MIP images confirms the above findings.   IMPRESSION: Unchanged size and appearance of ascending aorta, estimated 4.4 cm maximum dimension on the current CT. Recommend annual imaging followup by CTA or MRA. This recommendation follows 2010 ACCF/AHA/AATS/ACR/ASA/SCA/SCAI/SIR/STS/SVM Guidelines for the Diagnosis and Management of Patients with Thoracic Aortic Disease. Circulation. 2010; 121: Q947-M546. Aortic aneurysm NOS (ICD10-I71.9)   Redemonstration of changes of prior TAVR   Aortic Atherosclerosis (ICD10-I70.0).   Signed,   Dulcy Fanny. Dellia Nims, RPVI   [facility]   [facility]     Electronically Signed   By: [name] D.O.   On: 06/03/2021 15:54  Cardiac Cath 09/2019 1. Moderate eccentric proxima/mid LAD stenosis 2. Severe stenosis of a moderate sized first diagonal branch, not suitable for PCI  3. Patent left main, LCx, and RCA with mild nonobstructive plaque 4. Severely calcified aortic valve leaflets by plain  fluoroscopy with a 25 mmHg peak to peak gradient   Recommend: reviewed findings with [name]. Will arrange TAVR CT studies and refer to [name] who has been following the patient for his dilated ascending aorta. Multidisciplinary valve team review of case.     Past Medical History:  Diagnosis Date   Arthritis    R shoulder, great toes, hands- has gout & has injections in knees with Dr. Estanislado Pandy    Bicuspid aortic valve with ascending aorta 4.0 to 4.5 cm in diameter    CAD (coronary artery disease)    Cancer ([facility])    skin ca-    GERD (gastroesophageal reflux disease)    Headache    History of hiatal hernia    Hypertension    Prostate cancer ([place])    S/P TAVR (transcatheter aortic valve replacement)    SDH (subdural hematoma)    Severe aortic stenosis    Thoracic ascending aortic aneurysm    Vertebral artery aneurysm [facility])     Past Surgical History:  Procedure Laterality Date   BRAIN SURGERY  2016   evacuation of SDH   CARDIAC CATHETERIZATION     CARPAL TUNNEL RELEASE Bilateral    GREEN LIGHT LASER TURP (TRANSURETHRAL RESECTION OF PROSTATE  04/27/2017   KNEE ARTHROPLASTY     LEFT HEART CATH AND CORONARY ANGIOGRAPHY N/A 10/08/2019   Procedure: LEFT HEART CATH AND CORONARY ANGIOGRAPHY;  Surgeon: Sherren Mocha, MD;  Location: [place] [facility];  Service: Cardiovascular;  Laterality: N/A;   QUADRICEPS TENDON REPAIR Bilateral 06/15/2017   Procedure: REPAIR QUADRICEP TENDON;  Surgeon: Meredith Pel, MD;  Location: [facility];  Service: Orthopedics;  Laterality: Bilateral;   SHOULDER SURGERY Right    TRANSCATHETER AORTIC VALVE REPLACEMENT, TRANSFEMORAL  12/23/2019   TRANSCATHETER AORTIC VALVE REPLACEMENT, TRANSFEMORAL N/A 12/23/2019   Procedure: TRANSCATHETER AORTIC VALVE REPLACEMENT, TRANSFEMORAL;  Surgeon: Sherren Mocha, MD;  Location: [facility];  Service: Open Heart Surgery;  Laterality: N/A;   VASCULAR SURGERY      Current Medications: Current Meds  Medication Sig    allopurinol (ZYLOPRIM) 300 MG tablet TAKE 1 TABLET BY MOUTH EVERY DAY   aspirin 81 MG chewable tablet Chew 1 tablet (81 mg total) by mouth daily.   atorvastatin (LIPITOR) 20 MG tablet TAKE 1 TABLET BY MOUTH DAILY AT 6 PM.   Colchicine (MITIGARE) 0.6 MG CAPS Take 1 capsule by mouth daily as needed (for gout flares). *Please note grant information for Medicare and Mitigare copay assistance*   esomeprazole (NEXIUM) 20 MG capsule Take 20 mg by mouth daily at 12 noon.   ketoconazole (NIZORAL) 2 % shampoo 2 (two) times a week.   metoprolol succinate (TOPROL-XL) 50 MG 24 hr tablet Take 1 tablet (50 mg total) by mouth daily. Take with or immediately following a meal.   Multiple Vitamin (MULTIVITAMIN WITH MINERALS) TABS tablet Take 1 tablet by mouth daily.     Allergies:   Ciprofloxacin and Quinolones   Social History   Socioeconomic History   Marital status: Divorced    Spouse name: Not on file   Number of children: Not on file   Years of education: Not on file   Highest education level: Not on file  Occupational History   Not on file  Tobacco Use   Smoking status: Never   Smokeless tobacco: Never  Vaping Use   Vaping Use: Never used  Substance and Sexual Activity   Alcohol use: Yes    Alcohol/week: 8.0 standard drinks    Types: 7 Glasses of wine, 1 Shots of liquor per week   Drug use: No   Sexual activity: Not on file  Other Topics Concern   Not on file  Social History Narrative   Not on file   Social Determinants of Health   Financial Resource Strain: Not on file  Food Insecurity: Not on file  Transportation Needs: Not on file  Physical Activity: Not on file  Stress: Not on file  Social Connections: Not on file     Family History:  The patient's family history includes Diabetes in his brother; Heart disease in his father; Parkinson's disease in his brother and mother.  ROS:   Please see the history of present illness.  All other systems are reviewed and otherwise  negative.    EKG(s)/Additional Labs   EKG:  EKG is ordered today, personally reviewed, demonstrating NSR 78bpm, ILBBB, nonspecific STTW changes similar to prior  Recent Labs: 10/19/2021: ALT 34; BUN 22; Creat 1.21; Hemoglobin 15.2; Platelets 177; Potassium 4.5; Sodium 142  Recent Lipid Panel    Component Value Date/Time   CHOL 165 06/02/2021 1120   TRIG 74 06/02/2021 1120   HDL 75 06/02/2021 1120   CHOLHDL 2.2 06/02/2021 1120   CHOLHDL 3.7 CALC 12/25/2007 1636   VLDL 25 12/25/2007 1636   LDLCALC 76 06/02/2021 1120   LDLDIRECT 164.4 12/25/2007 1636    PHYSICAL EXAM:    VS:  BP 118/86    Pulse 78  Ht 6' (1.829 m)    Wt 205 lb 3.2 oz (93.1 kg)    SpO2 96%    BMI 27.83 kg/m   BMI: Body mass index is 27.83 kg/m.  GEN: Well nourished, well developed male in no acute distress HEENT: normocephalic, atraumatic Neck: no JVD, carotid bruits, or masses Cardiac: RRR; minimal SEM, no rubs or gallops, no edema  Respiratory:  clear to auscultation bilaterally, normal work of breathing GI: soft, nontender, nondistended, + BS MS: no deformity or atrophy Skin: warm and dry, no rash Neuro:  Alert and Oriented x 3, Strength and sensation are intact, follows commands Psych: euthymic mood, full affect  Wt Readings from Last 3 Encounters:  12/28/21 205 lb 3.2 oz (93.1 kg)  11/16/21 217 lb (98.4 kg)  10/19/21 211 lb 3.2 oz (95.8 kg)     ASSESSMENT & PLAN:   1. Severe AS s/p TAVR - clinically doing well without any new symptoms. He will continue baby ASA 81mg  daily. We discussed SBE ppx. He states his dentist prescribes this before each visit. He was encouraged to notify us if he ever needs our office to fill this. Mild perivalvular leak noted on last echocardiogram 05/2021. He had an echo done today in prep for his visit but result is pending and to be reviewed by Dr. Johnsie Cancel who placed original order.  2. Thoracic aortic aneurysm - discussed aneurysm precautions with patient and outlined  detailed information on AVS. He will be due for annual imaging in 05/2022, we will go ahead and arrange this. Will order the study gated for more accuracy. See below regarding BP, lipid control.  3. Essential HTN - diastolic BP slightly above goal. Systolic is normal. He follows it at home and reports his systolic pressure tends to run 845-364, diastolic rarely below 82. We need a better idea of what it's running at home to see if any of his medicines need to be adjusted The patient was provided instructions on monitoring blood pressure at home for 1 week and relaying results to our office.  4. CAD with goal LDL <70 - asymptomatic. Continue ASA, BB, statin. Lipid panel drawn today earlier in preparation for his visit. We will follow-up the result. Given his known CAD, goal LDL is <70 so may need to consider statin titration if it is suboptimally controlled.    Disposition: F/u with Dr. Johnsie Cancel after CT scan in June 2023. Patient is going to try to schedule this so that the CT occurs a few days prior to the appointment so they can review results at that visit.   Medication Adjustments/Labs and Tests Ordered: Current medicines are reviewed at length with the patient today.  Concerns regarding medicines are outlined above. Medication changes, Labs and Tests ordered today are summarized above and listed in the Patient Instructions accessible in Encounters.   Signed, [name], PA-C  12/28/2021 2:39 PM    [place] Phone: [phone]; Fax: [phone]

## 2021-12-26 NOTE — Telephone Encounter (Signed)
OK to get fasting lipid profile before OV If he is having any issues would recommend to wait until we discuss in OV in case additional bloodwork is indicated Thx

## 2021-12-28 ENCOUNTER — Ambulatory Visit (INDEPENDENT_AMBULATORY_CARE_PROVIDER_SITE_OTHER): Payer: Medicare Other | Admitting: Physician Assistant

## 2021-12-28 ENCOUNTER — Other Ambulatory Visit: Payer: Medicare Other | Admitting: *Deleted

## 2021-12-28 ENCOUNTER — Encounter: Payer: Self-pay | Admitting: Physician Assistant

## 2021-12-28 ENCOUNTER — Other Ambulatory Visit: Payer: Self-pay

## 2021-12-28 ENCOUNTER — Ambulatory Visit (HOSPITAL_COMMUNITY): Payer: Medicare Other | Attending: Cardiology

## 2021-12-28 VITALS — BP 118/86 | HR 78 | Ht 72.0 in | Wt 205.2 lb

## 2021-12-28 DIAGNOSIS — Z952 Presence of prosthetic heart valve: Secondary | ICD-10-CM | POA: Diagnosis present

## 2021-12-28 DIAGNOSIS — I10 Essential (primary) hypertension: Secondary | ICD-10-CM | POA: Diagnosis not present

## 2021-12-28 DIAGNOSIS — I350 Nonrheumatic aortic (valve) stenosis: Secondary | ICD-10-CM

## 2021-12-28 DIAGNOSIS — I7121 Aneurysm of the ascending aorta, without rupture: Secondary | ICD-10-CM | POA: Diagnosis not present

## 2021-12-28 DIAGNOSIS — I2510 Atherosclerotic heart disease of native coronary artery without angina pectoris: Secondary | ICD-10-CM

## 2021-12-28 DIAGNOSIS — E782 Mixed hyperlipidemia: Secondary | ICD-10-CM

## 2021-12-28 LAB — ECHOCARDIOGRAM COMPLETE
AV Mean grad: 24 mmHg
AV Peak grad: 38.4 mmHg
Ao pk vel: 3.1 m/s
Area-P 1/2: 2.33 cm2
P 1/2 time: 396 msec
S' Lateral: 3 cm

## 2021-12-28 LAB — LIPID PANEL
Chol/HDL Ratio: 3.1 ratio (ref 0.0–5.0)
Cholesterol, Total: 159 mg/dL (ref 100–199)
HDL: 52 mg/dL (ref >39)
LDL Chol Calc (NIH): 81 mg/dL (ref 0–99)
Triglycerides: 151 mg/dL — ABNORMAL HIGH (ref 0–149)
VLDL Cholesterol Cal: 26 mg/dL (ref 5–40)

## 2021-12-28 NOTE — Addendum Note (Signed)
Addended by: Gaetano Net on: 12/28/2021 03:50 PM   Modules accepted: Orders

## 2021-12-28 NOTE — Patient Instructions (Addendum)
Medication Instructions:  Your physician recommends that you continue on your current medications as directed. Please refer to the Current Medication list given to you today.  *If you need a refill on your cardiac medications before your next appointment, please call your pharmacy*   Lab Work: One ordered  If you have labs (blood work) drawn today and your tests are completely normal, you will receive your results only by: [place] (if you have MyChart) OR A paper copy in the mail If you have any lab test that is abnormal or we need to change your treatment, we will call you to review the results.   Testing/Procedures: Your physician recommends that you have a CTA Aorta.  A Non-Cardiac CT Angiography (CTA), is a special type of CT scan that uses a computer to produce multi-dimensional views of major blood vessels throughout the body. In CT angiography, a contrast material is injected through an IV to help visualize the blood vessels    Follow-Up: At [facility], you and your health needs are our priority.  As part of our continuing mission to provide you with exceptional heart care, we have created designated Provider Care Teams.  These Care Teams include your primary Cardiologist (physician) and Advanced Practice Providers (APPs -  Physician Assistants and Nurse Practitioners) who all work together to provide you with the care you need, when you need it.  We recommend signing up for the patient portal called "MyChart".  Sign up information is provided on this After Visit Summary.  MyChart is used to connect with patients for Virtual Visits (Telemedicine).  Patients are able to view lab/test results, encounter notes, upcoming appointments, etc.  Non-urgent messages can be sent to your provider as well.   To learn more about what you can do with MyChart, go to [email or web address].    Your next appointment:   In June, 2023, after the CT   The format for your next appointment:    In Person  Provider:   [name], MD    Other Instructions  Endocarditis Information  You may be at risk for developing endocarditis since you have an artificial heart valve or a repaired heart valve. Endocarditis is an infection of the lining of the heart or heart valves. Certain surgical and dental procedures may put you at risk, such as teeth cleaning or other dental procedures or other medical procedures. Notify our office or your dentist before having any dental work or invasive/surgical procedures. You will need to take antibiotics before certain procedures. To prevent endocarditis, maintain good oral health. Seek prompt medical attention for any mouth/gum, skin or urinary tract infections.  Information About Your Aneurysm  One of your tests has shown an aneurysm of your aorta. The word "aneurysm" refers to a bulge in an artery (blood vessel). Most people think of them in the context of an emergency, but yours was found incidentally. At this point there is nothing you need to do from a procedure standpoint, but there are some important things to keep in mind for day-to-day life.  Mainstays of therapy for aneurysms include very good blood pressure control, healthy lifestyle, and avoiding tobacco products and street drugs. Research has raised concern that antibiotics in the fluoroquinolone class could be associated with increased risk of having an aneurysm develop or tear. This includes medicines that end in "floxacin," like Cipro or Levaquin. Make sure to discuss this information with other healthcare providers if you require antibiotics.  Since aneurysms can run in families,  you should discuss your diagnosis with first degree relatives as they may need to be screened for this. Regular mild-moderate physical exercise is important, but avoid heavy lifting/weight lifting over 30lbs, chopping wood, shoveling snow or digging heavy earth with a shovel. It is best to avoid activities that cause  grunting or straining (medically referred to as a "Valsalva maneuver"). This happens when a person bears down against a closed throat to increase the strength of arm or abdominal muscles. There's often a tendency to do this when lifting heavy weights, doing sit-ups, push-ups or chin-ups, etc., but it may be harmful.  This is a finding I would expect to be monitored periodically by your cardiology team. Most unruptured thoracic aortic aneurysms cause no symptoms, so they are often found during exams for other conditions. Contact a health care provider if you develop any discomfort in your upper back, neck, abdomen, trouble swallowing, cough or hoarseness, or unexplained weight loss. Get help right away if you develop severe pain in your upper back or abdomen that may move into your chest and arms, or any other concerning symptoms such as shortness of breath or fever.  We need to get a better idea of what your blood pressure is running at home. Here are some instructions to follow: - I would recommend using a blood pressure cuff that goes on your arm. The wrist ones can be inaccurate. If you're purchasing one for the first time, try to select one that also reports your heart rate because this can be helpful information as well. - To check your blood pressure, choose a time at least 3 hours after taking your blood pressure medicines. If you can sample it at different times of the day, that's great - it might give you more information about how your blood pressure fluctuates. Remain seated in a chair for 5 minutes quietly beforehand, then check it.  - Please record a list of those readings and call us/send in MyChart message with them for our review in 1 week.

## 2021-12-29 ENCOUNTER — Telehealth: Payer: Self-pay | Admitting: *Deleted

## 2021-12-29 DIAGNOSIS — Z952 Presence of prosthetic heart valve: Secondary | ICD-10-CM

## 2021-12-29 DIAGNOSIS — Z79899 Other long term (current) drug therapy: Secondary | ICD-10-CM

## 2021-12-29 MED ORDER — ATORVASTATIN CALCIUM 40 MG PO TABS: 40.0000 mg | ORAL_TABLET | Freq: Every day | ORAL | 3 refills | Status: DC

## 2021-12-29 NOTE — Telephone Encounter (Signed)
-----   Message from [name], [place] sent at 12/29/2021  1:47 PM EST ----- I saw that Dr. Johnsie Cancel cc'd result note to [facility] so wasn't sure if she was calling pt with result so will cc to [facility] as well since we saw pt yesterday. His gradients (pressure measurement) with the TAVR valve are still somewhat elevated with mild valve leak but Dr. Johnsie Cancel feels this not much change from prior - since not symptomatic he would recommend repeating echo in 6 months. We can go ahead and get that in June prior to the [place] just as we are doing with the CTA. Thx.

## 2021-12-29 NOTE — Telephone Encounter (Signed)
-----   Message from [name], PA-C sent at 12/29/2021  8:22 AM EST ----- LDL above goal given CAD, should be less than 70. Recommend increasing atorvastatin to 40mg  daily with fasting lipid/liver in 8 weeks.

## 2022-01-06 DIAGNOSIS — Z79899 Other long term (current) drug therapy: Secondary | ICD-10-CM

## 2022-01-06 MED ORDER — LOSARTAN POTASSIUM 25 MG PO TABS: 25.0000 mg | ORAL_TABLET | Freq: Every day | ORAL | 3 refills | Status: DC

## 2022-01-06 NOTE — Telephone Encounter (Signed)
I do not see Pam is here today so will cc to Jeanann Lewandowsky to assist. Thanks.

## 2022-01-06 NOTE — Telephone Encounter (Signed)
Placed call to regarding my chart message. Pt has been made aware to start Cozar 25 mg taking 1 tablet daily.  We will repeat BMET 02/06/2022.

## 2022-01-06 NOTE — Telephone Encounter (Signed)
Pt of yours with hx of bicuspid AV with severe AS s/p TAVR 12/2019, CAD, spontaneous CNS bleed with craniotomy 2016 in [place], left vertebral artery aneurysm (followed in [place]), ascending aortic aneurysm, prostate CA. Feeling great in recent follow-up. You reviewed his recent echo and felt overall stable to repeat in 6 months. Diastolic BPs remain elevated at times but systolics largely controlled. Do you suggest any additional recommendations for med adjustment or hold course? On Toprol 50mg  daily. Cr 1.21 by last check. HR 70s. Thanks.

## 2022-01-09 NOTE — Progress Notes (Signed)
Virtual Visit via Video Note   This visit type was conducted due to national recommendations for restrictions regarding the COVID-19 Pandemic (e.g. social distancing) in an effort to limit this patient's exposure and mitigate transmission in our community.  Due to her co-morbid illnesses, this patient is at least at moderate risk for complications without adequate follow up.  This format is felt to be most appropriate for this patient at this time.  All issues noted in this document were discussed and addressed.  A limited physical exam was performed with this format.  Please refer to the patient's chart for her consent to telehealth for [facility].   Patient Location: Home Physician Location: Office    History of Present Illness:    71 y.o. with history of GERD, HTN,HLD, spontaneous CNS bleed with craniotomy 2016 when living in [place]. Bicuspid AV with severe AS and dilated aortic root 4.3 cm. Had TAVR with 26 mm Edwards Sapien valve 12/23/19.  Cath prior to procedure showed only obstructive CAD in diagonal  Rx medically Post op TTE with normal EF mean gradient 11 mmHg no PVL. Plavix now d/c due to history of [place]  TTE 12/22/20 had new mild central AR EF 60-65% mean gradient 14 peak 26 mmHg with DVI 0.30 EF 50-55%  TTE 12/28/21 EF 50-55% mean gradient 24 mmHg peak 38.4 mmHg DVI 0.28 mild PVL   LDL 72 on statin   No cardiac symptoms Getting CTA for aneurysm June of this year   Diastolic pressures have been high Given low normal EF , aneurysm wanted patient to start on ARB he is already on a beta blocker   Has prostate cancer and having radioactive seeds placed in [place]      Past Medical History:  Diagnosis Date   Arthritis    R shoulder, great toes, hands- has gout & has injections in knees with Dr. Estanislado Pandy    Bicuspid aortic valve with ascending aorta 4.0 to 4.5 cm in diameter    CAD (coronary artery disease)    Cancer ([place])    skin ca-    GERD (gastroesophageal reflux  disease)    Headache    History of hiatal hernia    Hypertension    Prostate cancer ([place])    S/P TAVR (transcatheter aortic valve replacement)    SDH (subdural hematoma)    Severe aortic stenosis    Thoracic ascending aortic aneurysm    Vertebral artery aneurysm [facility])     Past Surgical History:  Procedure Laterality Date   BRAIN SURGERY  2016   evacuation of SDH   CARDIAC CATHETERIZATION     CARPAL TUNNEL RELEASE Bilateral    GREEN LIGHT LASER TURP (TRANSURETHRAL RESECTION OF PROSTATE  04/27/2017   KNEE ARTHROPLASTY     LEFT HEART CATH AND CORONARY ANGIOGRAPHY N/A 10/08/2019   Procedure: LEFT HEART CATH AND CORONARY ANGIOGRAPHY;  Surgeon: Sherren Mocha, MD;  Location: [facility];  Service: Cardiovascular;  Laterality: N/A;   QUADRICEPS TENDON REPAIR Bilateral 06/15/2017   Procedure: REPAIR QUADRICEP TENDON;  Surgeon: Meredith Pel, MD;  Location: [facility];  Service: Orthopedics;  Laterality: Bilateral;   SHOULDER SURGERY Right    TRANSCATHETER AORTIC VALVE REPLACEMENT, TRANSFEMORAL  12/23/2019   TRANSCATHETER AORTIC VALVE REPLACEMENT, TRANSFEMORAL N/A 12/23/2019   Procedure: TRANSCATHETER AORTIC VALVE REPLACEMENT, TRANSFEMORAL;  Surgeon: Sherren Mocha, MD;  Location: [facility];  Service: Open Heart Surgery;  Laterality: N/A;   VASCULAR SURGERY      Current  Medications: Current Meds  Medication Sig   allopurinol (ZYLOPRIM) 300 MG tablet TAKE 1 TABLET BY MOUTH EVERY DAY   aspirin 81 MG chewable tablet Chew 1 tablet (81 mg total) by mouth daily.   atorvastatin (LIPITOR) 40 MG tablet Take 1 tablet (40 mg total) by mouth daily.   Colchicine (MITIGARE) 0.6 MG CAPS Take 1 capsule by mouth daily as needed (for gout flares). *Please note grant information for Medicare and Mitigare copay assistance*   esomeprazole (NEXIUM) 20 MG capsule Take 20 mg by mouth daily at 12 noon.   ketoconazole (NIZORAL) 2 % shampoo 2 (two) times a week.   losartan (COZAAR) 25 MG tablet Take 1 tablet  (25 mg total) by mouth daily.   metoprolol succinate (TOPROL-XL) 50 MG 24 hr tablet Take 1 tablet (50 mg total) by mouth daily. Take with or immediately following a meal.   Multiple Vitamin (MULTIVITAMIN WITH MINERALS) TABS tablet Take 1 tablet by mouth daily.   oxyCODONE (OXY IR/ROXICODONE) 5 MG immediate release tablet Take by mouth as directed.     Allergies:   Ciprofloxacin and Quinolones   Social History   Socioeconomic History   Marital status: Divorced    Spouse name: Not on file   Number of children: Not on file   Years of education: Not on file   Highest education level: Not on file  Occupational History   Not on file  Tobacco Use   Smoking status: Never   Smokeless tobacco: Never  Vaping Use   Vaping Use: Never used  Substance and Sexual Activity   Alcohol use: Yes    Alcohol/week: 8.0 standard drinks    Types: 7 Glasses of wine, 1 Shots of liquor per week   Drug use: No   Sexual activity: Not on file  Other Topics Concern   Not on file  Social History Narrative   Not on file   Social Determinants of Health   Financial Resource Strain: Not on file  Food Insecurity: Not on file  Transportation Needs: Not on file  Physical Activity: Not on file  Stress: Not on file  Social Connections: Not on file     Family History: The patient's family history includes Diabetes in his brother; Heart disease in his father; Parkinson's disease in his brother and mother.  ROS:   Please see the history of present illness.    All other systems reviewed and are negative.  EKGs/Labs/Other Studies Reviewed:    The following studies were reviewed today: TAVR OPERATIVE NOTE     Date of Procedure:                12/23/2019   Preoperative Diagnosis:      Severe Aortic Stenosis    Postoperative Diagnosis:    Same    Procedure:        Transcatheter Aortic Valve Replacement - Percutaneous Transfemoral Approach             Edwards Sapien 3 UltraTHV (size 26 mm, model #  [id number], serial # [id number])              Co-Surgeons:                        Gaye Pollack, MD and Sherren Mocha, MD   Anesthesiologist:                  Duane Boston, MD   Echocardiographer:  [name], MD   Pre-operative Echo Findings: Severe bicuspid aortic stenosis Normal left ventricular systolic function   Post-operative Echo Findings: No paravalvular leak Normal left ventricular systolic function   ______________   12/24/19 ECHO IMPRESSIONS  1. Left ventricular ejection fraction, by visual estimation, is 60 to 65%. The left ventricle has normal function. There is mildly increased left ventricular hypertrophy.  2. Left ventricular diastolic parameters are consistent with Grade I diastolic dysfunction (impaired relaxation).  3. The left ventricle has no regional wall motion abnormalities.  4. Global right ventricle has normal systolic function.The right ventricular size is normal. No increase in right ventricular wall thickness.  5. Left atrial size was normal.  6. Right atrial size was normal.  7. The mitral valve is normal in structure. No evidence of mitral valve regurgitation. No evidence of mitral stenosis.  8. The tricuspid valve is normal in structure.  9. Aortic valve regurgitation is not visualized. No evidence of aortic valve sclerosis or stenosis. 10. The pulmonic valve was normal in structure. Pulmonic valve regurgitation is not visualized. 11. There is moderate dilatation of the ascending aorta measuring 45 mm. 12. The inferior vena cava is normal in size with greater than 50% respiratory variability, suggesting right atrial pressure of 3 mmHg. 13. S/P TAVR with a 26 mm Edwards-SAPIEN 3 Ultra valve, peak/mean gradients 17/11 mmHg. No paravalvular leak.   Aortic Valve: The aortic valve has been repaired/replaced. Aortic valve regurgitation is not visualized. The aortic valve is structurally normal, with no evidence of sclerosis or stenosis. Aortic  valve mean gradient measures 10.5 mmHg. Aortic valve peak  gradient measures 17.0 mmHg. Aortic valve area, by VTI measures 3.38 cm. 26 Edwards Sapien bioprosthetic, stented aortic valve (TAVR) valve is present in the aortic position.   __________________   Echo 01/15/20 IMPRESSIONS   1. Left ventricular ejection fraction, by visual estimation, is 55 to 60%. The left ventricle has normal function. There is mildly increased left ventricular hypertrophy.   2. Left ventricular diastolic parameters are consistent with Grade I diastolic dysfunction (impaired relaxation).   3. The left ventricle has no regional wall motion abnormalities.   4. Global right ventricle has normal systolic function.The right ventricular size is normal. No increase in right ventricular wall thickness.   5. Left atrial size was mild-moderately dilated.   6. Right atrial size was normal.   7. The mitral valve is normal in structure. Trivial mitral valve regurgitation.   8. The tricuspid valve is normal in structure.   9. Aortic stent-valve (TAVR) prosthesis is well seated, without perivalvular leak.  10. Aortic valve mean gradient measures 13.7 mmHg.  11. Aortic valve peak gradient measures 24.4 mmHg.  12. Aortic valve regurgitation is not visualized.  13. The pulmonic valve was not well visualized. Pulmonic valve regurgitation is not visualized.  14. Aortic dilatation noted.  15. There is mild to moderate dilatation of the ascending aorta measuring  44 mm.  16. No significant change from prior study.  17. Prior images reviewed side by side.  18. When compared to the 12/24/2019 study, the TAVR gradients are very slightly higher. The changes in TAVR dimensionless index and calculated valve area are due to differences in LVOT pulsed wave sampling technique, rather than a true change in valve hemodynamics.   _______________  CT ANGIOGRAPHY CHEST WITH CONTRAST  06/03/21   MPRESSION: Unchanged size and appearance of  ascending aorta, estimated 4.4 cm maximum dimension on the current CT. Recommend annual imaging followup by  CTA or MRA. This recommendation follows 2010 ACCF/AHA/AATS/ACR/ASA/SCA/SCAI/SIR/STS/SVM Guidelines for the Diagnosis and Management of Patients with Thoracic Aortic Disease. Circulation. 2010; 121: H150-V697. Aortic aneurysm NOS (ICD10-I71.9)   Redemonstration of changes of prior TAVR   Aortic Atherosclerosis (ICD10-I70.0).   Signed,   Dulcy Fanny. Wagner, DO, RPVI __________________  Echo 12/28/21 IMPRESSIONS   1. Left ventricular ejection fraction, by estimation, is 50 to 55%. The  left ventricle has low normal function. The left ventricle demonstrates  global hypokinesis. There is mild left ventricular hypertrophy. Left  ventricular diastolic parameters are  consistent with Grade I diastolic dysfunction (impaired relaxation).   2. Right ventricular systolic function is mildly reduced. The right  ventricular size is normal. Tricuspid regurgitation signal is inadequate  for assessing PA pressure.   3. Left atrial size was mildly dilated.   4. The mitral valve is normal in structure. Trivial mitral valve  regurgitation. No evidence of mitral stenosis.   5. Bioprosthetic aortic valve s/p TAVR, 26 mm Edwards Sapien THV. Mean  gradient 24 mmHg which is elevated (22 mmHg on previous study).  Dimensionless index 0.3. Mild perivalvular leakage.   6. Aortic dilatation noted. There is moderate dilatation of the ascending  aorta, measuring 44 mm.   7. IVC not visualized.   Echo 12/22/20 IMPRESSIONS  1. Left ventricular ejection fraction, by estimation, is 60 to 65%. The  left ventricle has normal function. The left ventricle has no regional  wall motion abnormalities. There is mild left ventricular hypertrophy.  Left ventricular diastolic parameters  are consistent with Grade I diastolic dysfunction (impaired relaxation).   2. Right ventricular systolic function is normal. The right  ventricular  size is mildly enlarged.   3. The mitral valve is normal in structure. Trivial mitral valve  regurgitation. No evidence of mitral stenosis.   4. Mild TAVR valve regurgitation. The aortic valve has been  repaired/replaced. Aortic valve regurgitation is mild. No aortic stenosis  is present. There is a 26 mm Sapien prosthetic (TAVR) valve present in the  aortic position. Procedure Date: 12/23/19.  Aortic regurgitation PHT measures 833 msec. Aortic valve mean gradient  measures 14.0 mmHg. Aortic valve Vmax measures 2.56 m/s.   5. Aortic dilatation noted. There is moderate dilatation of the ascending  aorta, measuring 46 mm.   6. The inferior vena cava is normal in size with greater than 50%  respiratory variability, suggesting right atrial pressure of 3 mmHg.   Comparison(s): When compared to prior, there is a mild aortic  regurgitation signal.    EKG:     SR rate 64 LAD 01/01/20   Recent Labs: 10/19/2021: ALT 34; BUN 22; Creat 1.21; Hemoglobin 15.2; Platelets 177; Potassium 4.5; Sodium 142  Recent Lipid Panel    Component Value Date/Time   CHOL 159 12/28/2021 1054   TRIG 151 (H) 12/28/2021 1054   HDL 52 12/28/2021 1054   CHOLHDL 3.1 12/28/2021 1054   CHOLHDL 3.7 CALC 12/25/2007 1636   VLDL 25 12/25/2007 1636   LDLCALC 81 12/28/2021 1054   LDLDIRECT 164.4 12/25/2007 1636    Physical Exam:    VS:  BP 132/83    Pulse (!) 59    Ht 6' (1.829 m)    Wt 197 lb (89.4 kg)    BMI 26.72 kg/m     Wt Readings from Last 3 Encounters:  01/10/22 197 lb (89.4 kg)  12/28/21 205 lb 3.2 oz (93.1 kg)  11/16/21 217 lb (98.4 kg)     No distress  No JVP elevation No tachypnea No edema   PLAN:    In order of problems listed above:  1.S/p TAVR: 12/23/19 with 26 mm Sapien 3 valve gradients stable no PVL had mild central AR on TTE 12/22/20 will see next [place] with updated echo same day   2. HTN:  On Toprol ARB added f/u home monitoring   3. Aneursym: CT scan 06/03/21 4.4 cm ascending  aortic root  History of bicuspid AV F/U CT June 2023 on beta blocker and ARB   4. CAD: Cath prior to TAVR 10/08/19 with 75% small D1 and 50% mid LAD. No chest pain. Continue medical therapy with ASA, statin and BB. CM note indicates D1 not suitable for PCI  Continue beta blocker, baby asa, statin Plavix d/c with history of ICH  LDL at goal      Medication Adjustments/Labs and Tests Ordered:  Echo for TAVR 12/2022   Time : spent reviewing Cath, multiple echos CT chest direct patient interview and composing note 30 minutes   Signed, [name], MD  01/10/2022 11:21 AM    [place]

## 2022-01-10 ENCOUNTER — Telehealth (INDEPENDENT_AMBULATORY_CARE_PROVIDER_SITE_OTHER): Payer: Medicare Other | Admitting: Cardiovascular Disease

## 2022-01-10 ENCOUNTER — Encounter: Payer: Self-pay | Admitting: Cardiovascular Disease

## 2022-01-10 ENCOUNTER — Other Ambulatory Visit: Payer: Self-pay

## 2022-01-10 VITALS — BP 132/83 | HR 59 | Ht 72.0 in | Wt 197.0 lb

## 2022-01-10 DIAGNOSIS — I7121 Aneurysm of the ascending aorta, without rupture: Secondary | ICD-10-CM | POA: Diagnosis not present

## 2022-01-10 DIAGNOSIS — I2510 Atherosclerotic heart disease of native coronary artery without angina pectoris: Secondary | ICD-10-CM

## 2022-01-10 DIAGNOSIS — I10 Essential (primary) hypertension: Secondary | ICD-10-CM | POA: Diagnosis not present

## 2022-01-10 DIAGNOSIS — Z952 Presence of prosthetic heart valve: Secondary | ICD-10-CM

## 2022-01-10 NOTE — Patient Instructions (Signed)
Medication Instructions:  Your physician recommends that you continue on your current medications as directed. Please refer to the Current Medication list given to you today.  *If you need a refill on your cardiac medications before your next appointment, please call your pharmacy*  Lab Work: If you have labs (blood work) drawn today and your tests are completely normal, you will receive your results only by: [place] (if you have MyChart) OR A paper copy in the mail If you have any lab test that is abnormal or we need to change your treatment, we will call you to review the results.  Follow-Up: At [facility], you and your health needs are our priority.  As part of our continuing mission to provide you with exceptional heart care, we have created designated Provider Care Teams.  These Care Teams include your primary Cardiologist (physician) and Advanced Practice Providers (APPs -  Physician Assistants and Nurse Practitioners) who all work together to provide you with the care you need, when you need it.  We recommend signing up for the patient portal called "MyChart".  Sign up information is provided on this After Visit Summary.  MyChart is used to connect with patients for Virtual Visits (Telemedicine).  Patients are able to view lab/test results, encounter notes, upcoming appointments, etc.  Non-urgent messages can be sent to your provider as well.   To learn more about what you can do with MyChart, go to [email or web address].    Your next appointment:   6 month(s)  The format for your next appointment:   In Person  Provider:   [name], MD {

## 2022-02-02 DIAGNOSIS — Z79899 Other long term (current) drug therapy: Secondary | ICD-10-CM

## 2022-02-03 ENCOUNTER — Other Ambulatory Visit: Payer: Self-pay | Admitting: *Deleted

## 2022-02-03 DIAGNOSIS — Z79899 Other long term (current) drug therapy: Secondary | ICD-10-CM

## 2022-02-06 ENCOUNTER — Other Ambulatory Visit: Payer: Medicare Other

## 2022-02-07 LAB — BASIC METABOLIC PANEL
BUN/Creatinine Ratio: 22 (ref 10–24)
BUN: 23 mg/dL (ref 8–27)
CO2: 23 mmol/L (ref 20–29)
Calcium: 9.5 mg/dL (ref 8.6–10.2)
Chloride: 105 mmol/L (ref 96–106)
Creatinine, Ser: 1.03 mg/dL (ref 0.76–1.27)
Glucose: 111 mg/dL — ABNORMAL HIGH (ref 70–99)
Potassium: 4.5 mmol/L (ref 3.5–5.2)
Sodium: 143 mmol/L (ref 134–144)
eGFR: 77 mL/min/{1.73_m2} (ref >59)

## 2022-02-07 NOTE — Progress Notes (Signed)
Pt has been made aware of normal result and verbalized understanding.  jw

## 2022-03-03 ENCOUNTER — Other Ambulatory Visit: Payer: Medicare Other

## 2022-03-07 ENCOUNTER — Other Ambulatory Visit: Payer: Medicare Other | Admitting: *Deleted

## 2022-03-07 ENCOUNTER — Other Ambulatory Visit: Payer: Self-pay

## 2022-03-07 DIAGNOSIS — Z79899 Other long term (current) drug therapy: Secondary | ICD-10-CM

## 2022-03-08 LAB — HEPATIC FUNCTION PANEL
ALT: 34 IU/L (ref 0–44)
AST: 30 IU/L (ref 0–40)
Albumin: 4.2 g/dL (ref 3.7–4.7)
Alkaline Phosphatase: 83 IU/L (ref 44–121)
Bilirubin Total: 0.5 mg/dL (ref 0.0–1.2)
Bilirubin, Direct: 0.15 mg/dL (ref 0.00–0.40)
Total Protein: 6.4 g/dL (ref 6.0–8.5)

## 2022-03-08 LAB — BASIC METABOLIC PANEL
BUN/Creatinine Ratio: 15 (ref 10–24)
BUN: 15 mg/dL (ref 8–27)
CO2: 25 mmol/L (ref 20–29)
Calcium: 9.5 mg/dL (ref 8.6–10.2)
Chloride: 105 mmol/L (ref 96–106)
Creatinine, Ser: 1.02 mg/dL (ref 0.76–1.27)
Glucose: 98 mg/dL (ref 70–99)
Potassium: 4.2 mmol/L (ref 3.5–5.2)
Sodium: 144 mmol/L (ref 134–144)
eGFR: 78 mL/min/{1.73_m2} (ref >59)

## 2022-03-08 LAB — LIPID PANEL
Chol/HDL Ratio: 2.5 ratio (ref 0.0–5.0)
Cholesterol, Total: 183 mg/dL (ref 100–199)
HDL: 74 mg/dL (ref >39)
LDL Chol Calc (NIH): 86 mg/dL (ref 0–99)
Triglycerides: 137 mg/dL (ref 0–149)
VLDL Cholesterol Cal: 23 mg/dL (ref 5–40)

## 2022-03-09 DIAGNOSIS — I2510 Atherosclerotic heart disease of native coronary artery without angina pectoris: Secondary | ICD-10-CM

## 2022-03-09 NOTE — Telephone Encounter (Signed)
Called pt to review lab results and provider recommendations.  RN explained to pt that while cholesterol numbers appear to be normal cardiology would like LDL < 70 to decrease the risk of having a cardiac event.  Pt does not want to increase atorvastatin at this time.  Would like to work on diet and exercise.  Will add FLP to lab draw due on 05/17/22.  Pt aware to fast prior.  No further questions or concerns. Will route to provider to notify of change in plan.   ?

## 2022-03-14 ENCOUNTER — Other Ambulatory Visit: Payer: Self-pay

## 2022-03-14 DIAGNOSIS — E782 Mixed hyperlipidemia: Secondary | ICD-10-CM

## 2022-03-15 ENCOUNTER — Other Ambulatory Visit: Payer: Self-pay | Admitting: Physician Assistant

## 2022-03-19 ENCOUNTER — Encounter: Payer: Self-pay | Admitting: Rheumatology

## 2022-04-04 ENCOUNTER — Other Ambulatory Visit: Payer: Self-pay | Admitting: Physician Assistant

## 2022-04-04 NOTE — Telephone Encounter (Signed)
Next Visit: 04/18/2022 ? ?Last Visit: 10/19/2021 ? ?Last Fill: 12/13/2021 ? ?DX: Idiopathic chronic gout of multiple sites without tophus ? ?Current Dose per office note 10/19/2021:  allopurinol 300 mg 1 tablet by mouth daily  ? ?Labs: 03/07/2022 WNL 10/19/2021 Uric acid is within the desirable range.  ? ?Okay to refill Allopurinol?  ?

## 2022-04-04 NOTE — Progress Notes (Deleted)
Office Visit Note  Patient: Jose [name]             Date of Birth: 1951-06-18           MRN: [id number]             PCP: [name], MD Referring: [name], MD Visit Date: 04/18/2022 Occupation: '@GUAROCC'$ @  Subjective:    History of Present Illness: Jose [name] is a 71 y.o. male with history of gout and osteoarthritis.  He is taking allopurinol 300 mg 1 tablet by mouth daily and colchicine 0.6 mg as needed during flares.   Activities of Daily Living:  Patient reports morning stiffness for *** {minute/hour:19697}.   Patient {ACTIONS;DENIES/REPORTS:21021675::"Denies"} nocturnal pain.  Difficulty dressing/grooming: {ACTIONS;DENIES/REPORTS:21021675::"Denies"} Difficulty climbing stairs: {ACTIONS;DENIES/REPORTS:21021675::"Denies"} Difficulty getting out of chair: {ACTIONS;DENIES/REPORTS:21021675::"Denies"} Difficulty using hands for taps, buttons, cutlery, and/or writing: {ACTIONS;DENIES/REPORTS:21021675::"Denies"}  No Rheumatology ROS completed.   PMFS History:  Patient Active Problem List   Diagnosis Date Noted  . Hypertension   . Severe aortic stenosis 10/08/2019  . Candidiasis   . Abnormality of gait   . Hypoalbuminemia due to protein-calorie malnutrition ([place])   . History of gout   . Sleep disturbance   . Constipation   . Rupture of quadriceps tendon, right, sequela 06/19/2017  . Quadriceps tendon rupture, left, sequela 06/19/2017  . Acute blood loss anemia 06/19/2017  . Fall   . History of subdural hematoma   . Post-operative pain   . Recurrent falls   . Quadriceps tendon rupture 06/15/2017  . Primary osteoarthritis of both knees 02/28/2017  . Primary osteoarthritis of both hands 02/28/2017  . Uricacidemia 02/28/2017  . Pain in joint of left knee 02/07/2017  . Idiopathic chronic gout, unspecified site, without tophus (tophi) 11/29/2016  . HEMATURIA UNSPECIFIED 01/25/2009  . ABDOMINAL PAIN 01/25/2009  . XERODERMA 03/10/2008  . UNS ADVRS EFF UNS  RX MEDICINAL&BIOLOGICAL SBSTNC 03/10/2008  . ADJUSTMENT REACTION WITH PHYSICAL SYMPTOMS 02/14/2008  . MUSCLE SPASM 02/14/2008  . ACUTE PROSTATITIS 12/16/2007  . BREAST LUMP OR MASS, RIGHT 07/12/2007  . H/O: RCT (rotator cuff tear) 01/13/2007  . GOUT 01/08/2007    Past Medical History:  Diagnosis Date  . Arthritis    R shoulder, great toes, hands- has gout & has injections in knees with Dr. Estanislado Pandy   . Bicuspid aortic valve with ascending aorta 4.0 to 4.5 cm in diameter   . CAD (coronary artery disease)   . Cancer ([place])    skin ca-   . GERD (gastroesophageal reflux disease)   . Headache   . History of hiatal hernia   . Hypertension   . Prostate cancer ([place])   . S/P TAVR (transcatheter aortic valve replacement)   . SDH (subdural hematoma)   . Severe aortic stenosis   . Thoracic ascending aortic aneurysm   . Vertebral artery aneurysm ([facility])     Family History  Problem Relation Age of Onset  . Parkinson's disease Mother   . Heart disease Father   . Parkinson's disease Brother   . Diabetes Brother    Past Surgical History:  Procedure Laterality Date  . BRAIN SURGERY  2016   evacuation of SDH  . CARDIAC CATHETERIZATION    . CARPAL TUNNEL RELEASE Bilateral   . GREEN LIGHT LASER TURP (TRANSURETHRAL RESECTION OF PROSTATE  04/27/2017  . KNEE ARTHROPLASTY    . LEFT HEART CATH AND CORONARY ANGIOGRAPHY N/A 10/08/2019   Procedure: LEFT HEART CATH AND CORONARY ANGIOGRAPHY;  Surgeon: Sherren Mocha,  MD;  Location: [facility];  Service: Cardiovascular;  Laterality: N/A;  . QUADRICEPS TENDON REPAIR Bilateral 06/15/2017   Procedure: REPAIR QUADRICEP TENDON;  Surgeon: Meredith Pel, MD;  Location: [facility];  Service: Orthopedics;  Laterality: Bilateral;  . SHOULDER SURGERY Right   . TRANSCATHETER AORTIC VALVE REPLACEMENT, TRANSFEMORAL  12/23/2019  . TRANSCATHETER AORTIC VALVE REPLACEMENT, TRANSFEMORAL N/A 12/23/2019   Procedure: TRANSCATHETER AORTIC VALVE REPLACEMENT,  TRANSFEMORAL;  Surgeon: Sherren Mocha, MD;  Location: [facility] [address];  Service: Open Heart Surgery;  Laterality: N/A;  . VASCULAR SURGERY     Social History   Social History Narrative  . Not on file   Immunization History  Administered Date(s) Administered  . PFIZER(Purple Top)SARS-COV-2 Vaccination 02/19/2020, 03/17/2020, 10/25/2020, 08/30/2021     Objective: Vital Signs: There were no vitals taken for this visit.   Physical Exam Vitals and nursing note reviewed.  Constitutional:      Appearance: He is well-developed.  HENT:     Head: Normocephalic and atraumatic.  Eyes:     Conjunctiva/sclera: Conjunctivae normal.     Pupils: Pupils are equal, round, and reactive to light.  Cardiovascular:     Rate and Rhythm: Normal rate and regular rhythm.     Heart sounds: Normal heart sounds.  Pulmonary:     Effort: Pulmonary effort is normal.     Breath sounds: Normal breath sounds.  Abdominal:     General: Bowel sounds are normal.     Palpations: Abdomen is soft.  Musculoskeletal:     Cervical back: Normal range of motion and neck supple.  Skin:    General: Skin is warm and dry.     Capillary Refill: Capillary refill takes less than 2 seconds.  Neurological:     Mental Status: He is alert and oriented to person, place, and time.  Psychiatric:        Behavior: Behavior normal.     Musculoskeletal Exam: ***  CDAI Exam: CDAI Score: -- Patient Global: --; Provider Global: -- Swollen: --; Tender: -- Joint Exam 04/18/2022   No joint exam has been documented for this visit   There is currently no information documented on the homunculus. Go to the Rheumatology activity and complete the homunculus joint exam.  Investigation: No additional findings.  Imaging: No results found.  Recent Labs: Lab Results  Component Value Date   WBC 8.5 10/19/2021   HGB 15.2 10/19/2021   PLT 177 10/19/2021   NA 144 03/07/2022   K 4.2 03/07/2022   CL 105 03/07/2022   CO2 25 03/07/2022    GLUCOSE 98 03/07/2022   BUN 15 03/07/2022   CREATININE 1.02 03/07/2022   BILITOT 0.5 03/07/2022   ALKPHOS 83 03/07/2022   AST 30 03/07/2022   ALT 34 03/07/2022   PROT 6.4 03/07/2022   ALBUMIN 4.2 03/07/2022   CALCIUM 9.5 03/07/2022   GFRAA 70 05/05/2021    Speciality Comments: No specialty comments available.  Procedures:  No procedures performed Allergies: Ciprofloxacin and Quinolones   Assessment / Plan:     Visit Diagnoses: No diagnosis found.  Orders: No orders of the defined types were placed in this encounter.  No orders of the defined types were placed in this encounter.   Face-to-face time spent with patient was *** minutes. Greater than 50% of time was spent in counseling and coordination of care.  Follow-Up Instructions: No follow-ups on file.   Earnestine Mealing, CMA  Note - This record has been created using Diplomatic Services operational officer  software.  Chart creation errors have been sought, but may not always  have been located. Such creation errors do not reflect on  the standard of medical care.  

## 2022-04-09 ENCOUNTER — Encounter: Payer: Self-pay | Admitting: Rheumatology

## 2022-04-11 NOTE — Telephone Encounter (Signed)
The last injection was in the A3 pulley in the first two were in the A1 pulley.  It is okay to schedule an appointment for the injection.

## 2022-04-17 NOTE — Telephone Encounter (Signed)
Please call pt, his dates are different than what I see in Epic appts. I see labs scheduled 5/3 (? Lipid panel) and a pre-CT BMET scheduled 6/6. I believe he will likely need the BMET within a certain timeframe before his CT scan on 05/24/22 so please check with CT. But OK to consolidate these labs to a BMET/lipid profile within a suitable timeframe before the patient's CT when he would prefer. We unfortunately just can't do the same day as the CT as they will need it in advance of the study. ?

## 2022-04-18 ENCOUNTER — Ambulatory Visit: Payer: Medicare Other | Admitting: Physician Assistant

## 2022-04-18 DIAGNOSIS — S065XAA Traumatic subdural hemorrhage with loss of consciousness status unknown, initial encounter: Secondary | ICD-10-CM

## 2022-04-18 DIAGNOSIS — A809 Acute poliomyelitis, unspecified: Secondary | ICD-10-CM

## 2022-04-18 DIAGNOSIS — Z952 Presence of prosthetic heart valve: Secondary | ICD-10-CM

## 2022-04-18 DIAGNOSIS — M19041 Primary osteoarthritis, right hand: Secondary | ICD-10-CM

## 2022-04-18 DIAGNOSIS — S76112S Strain of left quadriceps muscle, fascia and tendon, sequela: Secondary | ICD-10-CM

## 2022-04-18 DIAGNOSIS — Z5181 Encounter for therapeutic drug level monitoring: Secondary | ICD-10-CM

## 2022-04-18 DIAGNOSIS — G629 Polyneuropathy, unspecified: Secondary | ICD-10-CM

## 2022-04-18 DIAGNOSIS — M19071 Primary osteoarthritis, right ankle and foot: Secondary | ICD-10-CM

## 2022-04-18 DIAGNOSIS — M170 Bilateral primary osteoarthritis of knee: Secondary | ICD-10-CM

## 2022-04-18 DIAGNOSIS — M65351 Trigger finger, right little finger: Secondary | ICD-10-CM

## 2022-04-18 DIAGNOSIS — S76111S Strain of right quadriceps muscle, fascia and tendon, sequela: Secondary | ICD-10-CM

## 2022-04-18 DIAGNOSIS — M65341 Trigger finger, right ring finger: Secondary | ICD-10-CM

## 2022-04-18 DIAGNOSIS — M1A09X0 Idiopathic chronic gout, multiple sites, without tophus (tophi): Secondary | ICD-10-CM

## 2022-04-19 ENCOUNTER — Other Ambulatory Visit: Payer: Medicare Other | Admitting: *Deleted

## 2022-04-19 ENCOUNTER — Telehealth: Payer: Self-pay

## 2022-04-19 ENCOUNTER — Other Ambulatory Visit: Payer: Self-pay

## 2022-04-19 DIAGNOSIS — I2510 Atherosclerotic heart disease of native coronary artery without angina pectoris: Secondary | ICD-10-CM

## 2022-04-19 DIAGNOSIS — I10 Essential (primary) hypertension: Secondary | ICD-10-CM

## 2022-04-19 DIAGNOSIS — Z952 Presence of prosthetic heart valve: Secondary | ICD-10-CM

## 2022-04-19 DIAGNOSIS — I7121 Aneurysm of the ascending aorta, without rupture: Secondary | ICD-10-CM

## 2022-04-19 DIAGNOSIS — I350 Nonrheumatic aortic (valve) stenosis: Secondary | ICD-10-CM

## 2022-04-19 LAB — LIPID PANEL
Chol/HDL Ratio: 2.7 ratio (ref 0.0–5.0)
Cholesterol, Total: 188 mg/dL (ref 100–199)
HDL: 70 mg/dL (ref >39)
LDL Chol Calc (NIH): 98 mg/dL (ref 0–99)
Triglycerides: 117 mg/dL (ref 0–149)
VLDL Cholesterol Cal: 20 mg/dL (ref 5–40)

## 2022-04-19 NOTE — Addendum Note (Signed)
Addended by: Willeen Cass A on: 04/19/2022 09:10 AM ? ? Modules accepted: Orders ? ?

## 2022-04-19 NOTE — Telephone Encounter (Signed)
Left message for the patient to contact the office. 

## 2022-04-19 NOTE — Addendum Note (Signed)
Addended by: Willeen Cass A on: 04/19/2022 09:13 AM ? ? Modules accepted: Orders ? ?

## 2022-04-21 ENCOUNTER — Other Ambulatory Visit: Payer: Medicare Other

## 2022-04-23 NOTE — Progress Notes (Addendum)
? ?Virtual Visit via Video Note  ? ?This visit type was conducted due to national recommendations for restrictions regarding the COVID-19 Pandemic (e.g. social distancing) in an effort to limit this patient's exposure and mitigate transmission in our community.  Due to his co-morbid illnesses, this patient is at least at moderate risk for complications without adequate follow up.  This format is felt to be most appropriate for this patient at this time.  All issues noted in this document were discussed and addressed.  A limited physical exam was performed with this format.  Please refer to the patient's chart for his consent to telehealth for [facility]. ? ?   ? ?The patient was identified using 2 identifiers. ? ?Date:  04/25/2022  ? ?ID:  Jose [name], DOB Sep 06, 1951, MRN [id number] ? ?Patient Location: Home ?Provider Location: Office/Clinic ? ?PCP:  [name], MD  ?Cardiologist:  [name], MD  ?Electrophysiologist:  None  ? ?Evaluation Performed:  Follow-Up Visit ? ?Chief Complaint:  discuss labwork ? ?History of Present Illness:   ? ?Jose [name] is a 71 y.o. male with bicuspid AV with severe AS s/p TAVR 12/2019, CAD, spontaneous CNS bleed with craniotomy 2016 in [place], left vertebral artery aneurysm (followed in [place]), ascending aortic aneurysm, prostate CA who presents to clinic for follow-up.  ?  ?He has been followed for dilated ascending aorta and bicupid AV for many years. He developed progressive severe AS in 2020. Cath 10/2019 showed moderate eccentric proximal/mid LAD stenosis, severe stenosis of a moderate sized first diagonal branch, not suitable for PCI, patent left main, LCx, and RCA with mild nonobstructive plaque. This was managed medically. He ultimately underwent TAVR 12/2019. He was discharged on ASA + Plavix but Plavix later discontinued history of intracranial bleed. Last CT angio aorta 05/2021 showed unchanged size measuring 4.4cm, recommended for annual imaging. Last echo  12/2021 showed EF 50-55%, global HK, g1DD, mildly reduced RVSF, mild LAE, bioprosthetic AVR with mild perivalvular leakage, 48m ascending aorta. I met him in [place] 12/2021 and per d/w Dr NJohnsie Cancelwe started losartan for borderline elevated BP and EF 50-55%.  ? ?He requested virtual visit today to discuss his recent cholesterol levels in depth. Labs in 02/2022 demonstrated LDL not at goal; statin titration advised, patient wished to defer and trial lifestyle modifications. F/u LDL is 98 by labs 04/19/22. He is frustrated to see the increase because he has been eating healthier, incorporating more fish, decreasing hard liquor. He also did add in a Niacin supplement as well as beets in his diet. ? ? ?Labs Independently Reviewed ?04/19/22 LDL 98, trig 117, HDL 70 ?02/2022 BMET wnl - Cr 1.02, LFTs wnl, LDL 86 ?10/2021 CMET OK Cr 1.21, K 4.5, CBC wnl, LDL 76, trig 74 ? ?Past Medical History:  ?Diagnosis Date  ? Arthritis   ? R shoulder, great toes, hands- has gout & has injections in knees with Dr. DEstanislado Pandy  ? Bicuspid aortic valve with ascending aorta 4.0 to 4.5 cm in diameter   ? CAD (coronary artery disease)   ? Cancer ([facility]   ? skin ca-   ? GERD (gastroesophageal reflux disease)   ? Headache   ? History of hiatal hernia   ? Hypertension   ? [place]   ? S/P TAVR (transcatheter aortic valve replacement)   ? SDH (subdural hematoma) ([facility])   ? Severe aortic stenosis   ? Thoracic [place]   ? [place]   ? ?  Past Surgical History:  ?Procedure Laterality Date  ? BRAIN SURGERY  2016  ? evacuation of SDH  ? CARDIAC CATHETERIZATION    ? CARPAL TUNNEL RELEASE Bilateral   ? GREEN LIGHT LASER TURP (TRANSURETHRAL RESECTION OF PROSTATE  04/27/2017  ? KNEE ARTHROPLASTY    ? LEFT HEART CATH AND CORONARY ANGIOGRAPHY N/A 10/08/2019  ? Procedure: LEFT HEART CATH AND CORONARY ANGIOGRAPHY;  Surgeon: Sherren Mocha, MD;  Location: [facility];  Service: Cardiovascular;  Laterality:  N/A;  ? QUADRICEPS TENDON REPAIR Bilateral 06/15/2017  ? Procedure: REPAIR QUADRICEP TENDON;  Surgeon: Meredith Pel, MD;  Location: [facility];  Service: Orthopedics;  Laterality: Bilateral;  ? SHOULDER SURGERY Right   ? TRANSCATHETER AORTIC VALVE REPLACEMENT, TRANSFEMORAL  12/23/2019  ? TRANSCATHETER AORTIC VALVE REPLACEMENT, TRANSFEMORAL N/A 12/23/2019  ? Procedure: TRANSCATHETER AORTIC VALVE REPLACEMENT, TRANSFEMORAL;  Surgeon: Sherren Mocha, MD;  Location: [facility];  Service: Open Heart Surgery;  Laterality: N/A;  ? VASCULAR SURGERY    ?  ? ?Current Meds  ?Medication Sig  ? allopurinol (ZYLOPRIM) 300 MG tablet TAKE 1 TABLET BY MOUTH EVERY DAY  ? aspirin 81 MG chewable tablet Chew 1 tablet (81 mg total) by mouth daily.  ? atorvastatin (LIPITOR) 40 MG tablet Take 1 tablet (40 mg total) by mouth daily.  ? Colchicine (MITIGARE) 0.6 MG CAPS Take 1 capsule by mouth daily as needed (for gout flares). *Please note grant information for Medicare and Mitigare copay assistance*  ? esomeprazole (NEXIUM) 20 MG capsule Take 20 mg by mouth daily at 12 noon.  ? ketoconazole (NIZORAL) 2 % shampoo 2 (two) times a week.  ? losartan (COZAAR) 25 MG tablet Take 1 tablet (25 mg total) by mouth daily.  ? metoprolol succinate (TOPROL-XL) 50 MG 24 hr tablet TAKE 1 TABLET BY MOUTH EVERY DAY WITH OR IMMEDIATELY FOLLOWING A MEAL  ? Multiple Vitamin (MULTIVITAMIN WITH MINERALS) TABS tablet Take 1 tablet by mouth daily.  ?  ? ?Allergies:   Ciprofloxacin and Quinolones  ? ?Social History  ? ?Tobacco Use  ? Smoking status: Never  ? Smokeless tobacco: Never  ?Vaping Use  ? Vaping Use: Never used  ?Substance Use Topics  ? Alcohol use: Yes  ?  Alcohol/week: 8.0 standard drinks  ?  Types: 7 Glasses of wine, 1 Shots of liquor per week  ? Drug use: No  ?  ? ?Family Hx: ?The patient's family history includes Diabetes in his brother; Heart disease in his father; Parkinson's disease in his brother and mother. ? ?ROS:   ?Please see the history of present  illness.    ?All other systems reviewed and are negative. ? ? ?Prior CV studies:   ?The following studies were reviewed today: ? ?2D Echo 12/28/21 ? ? 1. Left ventricular ejection fraction, by estimation, is 50 to 55%. The  ?left ventricle has low normal function. The left ventricle demonstrates  ?global hypokinesis. There is mild left ventricular hypertrophy. Left  ?ventricular diastolic parameters are  ?consistent with Grade I diastolic dysfunction (impaired relaxation).  ? 2. Right ventricular systolic function is mildly reduced. The right  ?ventricular size is normal. Tricuspid regurgitation signal is inadequate  ?for assessing PA pressure.  ? 3. Left atrial size was mildly dilated.  ? 4. The mitral valve is normal in structure. Trivial mitral valve  ?regurgitation. No evidence of mitral stenosis.  ? 5. Bioprosthetic aortic valve s/p TAVR, 26 mm Edwards Sapien THV. Mean  ?gradient 24 mmHg which is elevated (22  mmHg on previous study).  ?Dimensionless index 0.3. Mild perivalvular leakage.  ? 6. Aortic dilatation noted. There is moderate dilatation of the ascending  ?aorta, measuring 44 mm.  ? 7. IVC not visualized.  ? ?2D echo 05/2021 ? 1. Left ventricular ejection fraction, by estimation, is 60 to 65%. The  ?left ventricle has normal function. The left ventricle has no regional  ?wall motion abnormalities. Left ventricular diastolic parameters are  ?consistent with Grade I diastolic  ?dysfunction (impaired relaxation).  ? 2. Right ventricular systolic function is normal. The right ventricular  ?size is normal. There is normal pulmonary artery systolic pressure. The  ?estimated right ventricular systolic pressure is 58.4 mmHg.  ? 3. The mitral valve is grossly normal. No evidence of mitral valve  ?regurgitation.  ? 4. The aortic valve has been repaired/replaced. Aortic valve  ?regurgitation is trivial. There is a 26 mm Edwards Sapien prosthetic  ?(TAVR) valve present in the aortic position. Procedure Date:  12/23/2019.  ?Echo findings are consistent with perivalvular leak  ?of the aortic prosthesis. Aortic regurgitation PHT measures 829 msec.  ?Aortic valve area, by VTI measures 1.38 cm?Marland Kitchen Aortic valve mean gradient  ?

## 2022-04-25 ENCOUNTER — Encounter: Payer: Self-pay | Admitting: Physician Assistant

## 2022-04-25 ENCOUNTER — Telehealth (INDEPENDENT_AMBULATORY_CARE_PROVIDER_SITE_OTHER): Payer: Medicare Other | Admitting: Physician Assistant

## 2022-04-25 VITALS — BP 139/82 | HR 51 | Ht 72.0 in | Wt 204.0 lb

## 2022-04-25 DIAGNOSIS — I7121 Aneurysm of the ascending aorta, without rupture: Secondary | ICD-10-CM

## 2022-04-25 DIAGNOSIS — I10 Essential (primary) hypertension: Secondary | ICD-10-CM

## 2022-04-25 DIAGNOSIS — I2510 Atherosclerotic heart disease of native coronary artery without angina pectoris: Secondary | ICD-10-CM | POA: Diagnosis not present

## 2022-04-25 DIAGNOSIS — E785 Hyperlipidemia, unspecified: Secondary | ICD-10-CM

## 2022-04-25 DIAGNOSIS — Z952 Presence of prosthetic heart valve: Secondary | ICD-10-CM

## 2022-04-25 MED ORDER — ROSUVASTATIN CALCIUM 40 MG PO TABS
40.0000 mg | ORAL_TABLET | Freq: Every day | ORAL | 1 refills | Status: DC
Start: 2022-04-25 — End: 2022-09-26

## 2022-04-25 NOTE — Patient Instructions (Signed)
Medication Instructions:  ?DISCONTINUE Atorvastatin ?START Crestor '40mg'$  Take 1 tablet once a day  ?DISCONTINUE Niacin ?*If you need a refill on your cardiac medications before your next appointment, please call your pharmacy* ? ? ?Lab Work: ?None Ordered ? ? ?Testing/Procedures: ?None Ordered ? ? ?Follow-Up: ?At [facility], you and your health needs are our priority.  As part of our continuing mission to provide you with exceptional heart care, we have created designated Provider Care Teams.  These Care Teams include your primary Cardiologist (physician) and Advanced Practice Providers (APPs -  Physician Assistants and Nurse Practitioners) who all work together to provide you with the care you need, when you need it. ? ?We recommend signing up for the patient portal called "MyChart".  Sign up information is provided on this After Visit Summary.  MyChart is used to connect with patients for Virtual Visits (Telemedicine).  Patients are able to view lab/test results, encounter notes, upcoming appointments, etc.  Non-urgent messages can be sent to your provider as well.   ?To learn more about what you can do with MyChart, go to [email or web address].   ? ?Your next appointment:   ?  follow up as scheduled ? ?The format for your next appointment:   ?In Person ? ?Provider:   ?[name], MD   ? ? ?Other Instructions ? ? ?Important Information About Sugar ? ? ? ? ?  ?

## 2022-05-10 NOTE — Progress Notes (Unsigned)
Office Visit Note  Patient: Jose [name]             Date of Birth: 1951-05-21           MRN: [id number]             PCP: [name], MD Referring: [name], MD Visit Date: 05/24/2022 Occupation: '@GUAROCC'$ @  Subjective:  Right ring trigger finger   History of Present Illness: Jose [name] is a 71 y.o. male with history of gout and osteoarthritis.  He is taking allopurinol 300 mg 1 tablet by mouth daily and colchicine 0.6 mg 1 capsule by mouth daily as needed during flares.  According to the patient he has not had colchicine refilled since 2019.  He denies any signs or symptoms of a gout flare recently.  He has significantly reduced his intake of alcohol use which has helped to avoid gout flares.  He is currently going to physical therapy twice a week for his lower back and both knees.  He has been exercising at the gym 3 to 4 days a week and has been performing strengthening exercises. He presents today with tenderness and locking of the right ring finger.  He has had 3 ultrasound-guided right trigger finger injections in the past.    Activities of Daily Living:  Patient reports morning stiffness for 15 minutes.   Patient Reports nocturnal pain.  Difficulty dressing/grooming: Denies Difficulty climbing stairs: Reports Difficulty getting out of chair: Denies Difficulty using hands for taps, buttons, cutlery, and/or writing: Denies  Review of Systems  Constitutional:  Negative for fatigue and night sweats.  HENT:  Negative for mouth sores, mouth dryness and nose dryness.   Eyes:  Positive for dryness. Negative for redness.  Respiratory:  Negative for shortness of breath and difficulty breathing.   Cardiovascular:  Negative for chest pain, palpitations, hypertension, irregular heartbeat and swelling in legs/feet.  Gastrointestinal:  Negative for constipation and diarrhea.  Endocrine: Positive for increased urination.  Genitourinary:  Negative for difficulty urinating  and painful urination.  Musculoskeletal:  Positive for joint pain, joint pain and morning stiffness. Negative for joint swelling, myalgias, muscle weakness, muscle tenderness and myalgias.  Skin:  Negative for color change, rash, hair loss, nodules/bumps, skin tightness, ulcers and sensitivity to sunlight.  Allergic/Immunologic: Negative for susceptible to infections.  Neurological:  Negative for dizziness, fainting, numbness, memory loss, night sweats and weakness.  Hematological:  Positive for bruising/bleeding tendency. Negative for swollen glands.  Psychiatric/Behavioral:  Positive for sleep disturbance. Negative for depressed mood. The patient is not nervous/anxious.    PMFS History:  Patient Active Problem List   Diagnosis Date Noted   Hypertension    Severe aortic stenosis 10/08/2019   Candidiasis    Abnormality of gait    Hypoalbuminemia due to protein-calorie malnutrition [facility])    History of gout    Sleep disturbance    Constipation    Rupture of quadriceps tendon, right, sequela 06/19/2017   Quadriceps tendon rupture, left, sequela 06/19/2017   Acute blood loss anemia 06/19/2017   Fall    History of subdural hematoma    Post-operative pain    Recurrent falls    Quadriceps tendon rupture 06/15/2017   Primary osteoarthritis of both knees 02/28/2017   Primary osteoarthritis of both hands 02/28/2017   Uricacidemia 02/28/2017   Pain in joint of left knee 02/07/2017   Idiopathic chronic gout, unspecified site, without tophus (tophi) 11/29/2016   HEMATURIA UNSPECIFIED 01/25/2009   ABDOMINAL PAIN 01/25/2009  XERODERMA 03/10/2008   UNS ADVRS EFF UNS RX MEDICINAL&BIOLOGICAL SBSTNC 03/10/2008   ADJUSTMENT REACTION WITH PHYSICAL SYMPTOMS 02/14/2008   MUSCLE SPASM 02/14/2008   ACUTE PROSTATITIS 12/16/2007   BREAST LUMP OR MASS, RIGHT 07/12/2007   H/O: RCT (rotator cuff tear) 01/13/2007   GOUT 01/08/2007    Past Medical History:  Diagnosis Date   Arthritis    R shoulder,  great toes, hands- has gout & has injections in knees with Dr. Estanislado Pandy    Bicuspid aortic valve with ascending aorta 4.0 to 4.5 cm in diameter    CAD (coronary artery disease)    Cancer (HCC)    skin ca-    GERD (gastroesophageal reflux disease)    Headache    History of hiatal hernia    Hypertension    Prostate cancer ([place])    S/P TAVR (transcatheter aortic valve replacement)    SDH (subdural hematoma) (HCC)    Severe aortic stenosis    Thoracic ascending aortic aneurysm (HCC)    Vertebral artery aneurysm (HCC)     Family History  Problem Relation Age of Onset   Parkinson's disease Mother    Heart disease Father    Parkinson's disease Brother    Diabetes Brother    Past Surgical History:  Procedure Laterality Date   BRAIN SURGERY  2016   evacuation of SDH   CARDIAC CATHETERIZATION     CARPAL TUNNEL RELEASE Bilateral    GREEN LIGHT LASER TURP (TRANSURETHRAL RESECTION OF PROSTATE  04/27/2017   KNEE ARTHROPLASTY     LEFT HEART CATH AND CORONARY ANGIOGRAPHY N/A 10/08/2019   Procedure: LEFT HEART CATH AND CORONARY ANGIOGRAPHY;  Surgeon: Sherren Mocha, MD;  Location: [facility];  Service: Cardiovascular;  Laterality: N/A;   QUADRICEPS TENDON REPAIR Bilateral 06/15/2017   Procedure: REPAIR QUADRICEP TENDON;  Surgeon: Meredith Pel, MD;  Location: [facility];  Service: Orthopedics;  Laterality: Bilateral;   SHOULDER SURGERY Right    TRANSCATHETER AORTIC VALVE REPLACEMENT, TRANSFEMORAL  12/23/2019   TRANSCATHETER AORTIC VALVE REPLACEMENT, TRANSFEMORAL N/A 12/23/2019   Procedure: TRANSCATHETER AORTIC VALVE REPLACEMENT, TRANSFEMORAL;  Surgeon: Sherren Mocha, MD;  Location: [facility];  Service: Open Heart Surgery;  Laterality: N/A;   VASCULAR SURGERY     Social History   Social History Narrative   Not on file   Immunization History  Administered Date(s) Administered   PFIZER(Purple Top)SARS-COV-2 Vaccination 02/19/2020, 03/17/2020, 10/25/2020, 08/30/2021      Objective: Vital Signs: BP 135/77 (BP Location: Left Arm, Patient Position: Sitting, Cuff Size: Normal)   Pulse (!) 57   Resp 16   Ht 6' (1.829 m)   Wt 212 lb (96.2 kg)   BMI 28.75 kg/m    Physical Exam Vitals and nursing note reviewed.  Constitutional:      Appearance: He is well-developed.  HENT:     Head: Normocephalic and atraumatic.  Eyes:     Conjunctiva/sclera: Conjunctivae normal.     Pupils: Pupils are equal, round, and reactive to light.  Cardiovascular:     Rate and Rhythm: Normal rate and regular rhythm.     Heart sounds: Normal heart sounds.  Pulmonary:     Effort: Pulmonary effort is normal.     Breath sounds: Normal breath sounds.  Abdominal:     General: Bowel sounds are normal.     Palpations: Abdomen is soft.  Musculoskeletal:     Cervical back: Normal range of motion and neck supple.  Skin:    General: Skin is  warm and dry.     Capillary Refill: Capillary refill takes less than 2 seconds.  Neurological:     Mental Status: He is alert and oriented to person, place, and time.  Psychiatric:        Behavior: Behavior normal.     Musculoskeletal Exam: C-spine good ROM.  No midline spinal tenderness.  Shoulder joints, elbow joints, and wrist joints have good ROM.  PIP and DIP thickening consistent with osteoarthritis of both hands.  Right ring trigger finger.  Complete fist formation bilaterally.  Hip joints have good ROM.  Knee joints have good ROM with no warmth or effusion.  Ankle joints have good ROM with no tenderness or joint swelling.   CDAI Exam: CDAI Score: -- Patient Global: --; Provider Global: -- Swollen: --; Tender: -- Joint Exam 05/24/2022   No joint exam has been documented for this visit   There is currently no information documented on the homunculus. Go to the Rheumatology activity and complete the homunculus joint exam.  Investigation: No additional findings.  Imaging: ECHOCARDIOGRAM COMPLETE  Result Date: 05/24/2022     ECHOCARDIOGRAM REPORT   Patient Name:   Jose [name] Date of Exam: 05/24/2022 Medical Rec #:  [id number]       Height:       72.0 in Accession #:    [id number]      Weight:       204.0 lb Date of Birth:  10-07-1951       BSA:          2.149 m Patient Age:    48 years        BP:           139/82 mmHg Patient Gender: M               HR:           56 bpm. Exam Location:  [facility] Procedure: 2D Echo, Cardiac Doppler and Color Doppler Indications:    Z95.2 S/p TAVR  History:        Patient has prior history of Echocardiogram examinations, most                 recent 12/28/2021. S/p TAVR (77m Edwards Sapien); Risk                 Factors:Hypertension.                 Aortic Valve: 26 mm Sapien prosthetic, stented (TAVR) valve is                 present in the aortic position. Procedure Date: 12/23/2019.  Sonographer:    WCoralyn HellingRDCS Referring Phys: [id number] Sonographer Comments: Technically difficult study due to poor echo windows. IMPRESSIONS  1. 26 mm S3. Moderate to severe aortic regurgitation is present. Appears central. V max 3.6 m/s, MG 28 mmHG, EOA 1.10 cm2, DI 0.24. Gradients are concerning in setting of AI, and appear to be worsening. Valve looks to be deployed low in the LVOT, question underexpansion. Would recommend TEE or cardiac CT for better characterization. The aortic valve has been repaired/replaced. Aortic valve regurgitation is moderate to severe. There is a 26 mm Sapien prosthetic (TAVR) valve present in the aortic position. Procedure Date: 12/23/2019.  2. Left ventricular ejection fraction, by estimation, is 50 to 55%. The left ventricle has low normal function. The left ventricle has no regional wall motion abnormalities. Left ventricular diastolic parameters  are consistent with Grade I diastolic dysfunction (impaired relaxation).  3. Right ventricular systolic function is normal. The right ventricular size is normal.  4. The mitral valve is grossly normal. Mild mitral valve  regurgitation. No evidence of mitral stenosis.  5. Aneurysm of the ascending aorta, measuring 45 mm. Comparison(s): Changes from prior study are noted. Moderate to severe AI now present. Recommend TEE or cardiac CT. FINDINGS  Left Ventricle: Left ventricular ejection fraction, by estimation, is 50 to 55%. The left ventricle has low normal function. The left ventricle has no regional wall motion abnormalities. The left ventricular internal cavity size was normal in size. There is no left ventricular hypertrophy. Left ventricular diastolic parameters are consistent with Grade I diastolic dysfunction (impaired relaxation). Right Ventricle: The right ventricular size is normal. No increase in right ventricular wall thickness. Right ventricular systolic function is normal. Left Atrium: Left atrial size was normal in size. Right Atrium: Right atrial size was normal in size. Pericardium: There is no evidence of pericardial effusion. Mitral Valve: The mitral valve is grossly normal. Mild mitral valve regurgitation. No evidence of mitral valve stenosis. Tricuspid Valve: The tricuspid valve is grossly normal. Tricuspid valve regurgitation is trivial. No evidence of tricuspid stenosis. Aortic Valve: 26 mm S3. Moderate to severe aortic regurgitation is present. Appears central. V max 3.6 m/s, MG 28 mmHG, EOA 1.10 cm2, DI 0.24. Gradients are concerning in setting of AI, and appear to be worsening. Valve looks to be deployed low in the LVOT, question underexpansion. Would recommend TEE or cardiac CT for better characterization. The aortic valve has been repaired/replaced. Aortic valve regurgitation is moderate to severe. Aortic regurgitation PHT measures 759 msec. Aortic valve mean gradient measures 28.0 mmHg. Aortic valve peak gradient measures 53.1 mmHg. Aortic valve area, by VTI measures 1.10 cm. There is a 26 mm Sapien prosthetic, stented (TAVR) valve present in the aortic position. Procedure Date: 12/23/2019. Pulmonic Valve:  The pulmonic valve was grossly normal. Pulmonic valve regurgitation is not visualized. No evidence of pulmonic stenosis. Aorta: The aortic root is normal in size and structure. There is an aneurysm involving the ascending aorta measuring 45 mm. Venous: The inferior vena cava was not well visualized. IAS/Shunts: The atrial septum is grossly normal.  LEFT VENTRICLE PLAX 2D LVIDd:         5.30 cm   Diastology LVIDs:         3.90 cm   LV e' medial:    8.81 cm/s LV PW:         1.30 cm   LV E/e' medial:  9.3 LV IVS:        1.30 cm   LV e' lateral:   9.57 cm/s LVOT diam:     2.40 cm   LV E/e' lateral: 8.5 LV SV:         110 LV SV Index:   51 LVOT Area:     4.52 cm  RIGHT VENTRICLE RV S prime:     9.57 cm/s TAPSE (M-mode): 1.4 cm RVSP:           34.8 mmHg LEFT ATRIUM             Index        RIGHT ATRIUM           Index LA diam:        4.20 cm 1.95 cm/m   RA Pressure: 3.00 mmHg LA Vol (A2C):   73.2 ml 34.07 ml/m  RA Area:  19.30 cm LA Vol (A4C):   59.5 ml 27.69 ml/m  RA Volume:   55.70 ml  25.92 ml/m LA Biplane Vol: 66.1 ml 30.76 ml/m  AORTIC VALVE AV Area (Vmax):    1.15 cm AV Area (Vmean):   1.22 cm AV Area (VTI):     1.10 cm AV Vmax:           364.50 cm/s AV Vmean:          244.500 cm/s AV VTI:            1.005 m AV Peak Grad:      53.1 mmHg AV Mean Grad:      28.0 mmHg LVOT Vmax:         92.60 cm/s LVOT Vmean:        66.000 cm/s LVOT VTI:          0.244 m LVOT/AV VTI ratio: 0.24 AI PHT:            759 msec  AORTA Ao Asc diam: 4.50 cm MITRAL VALVE               TRICUSPID VALVE MV Area (PHT): 3.10 cm    TR Peak grad:   31.8 mmHg MV Decel Time: 245 msec    TR Vmax:        282.00 cm/s MV E velocity: 81.80 cm/s  Estimated RAP:  3.00 mmHg MV A velocity: 90.10 cm/s  RVSP:           34.8 mmHg MV E/A ratio:  0.91                            SHUNTS                            Systemic VTI:  0.24 m                            Systemic Diam: 2.40 cm Eleonore Chiquito MD Electronically signed by Eleonore Chiquito MD Signature  Date/Time: 05/24/2022/2:15:16 PM    Final    CT ANGIO CHEST AORTA W/ & OR WO/CM & GATING ([place] ONLY)  Result Date: 05/24/2022 CLINICAL DATA:  Ascending aortic aneurysm EXAM: CT ANGIOGRAPHY CHEST WITH CONTRAST TECHNIQUE: Multidetector CT imaging of the chest was performed using the standard protocol during bolus administration of intravenous contrast. Multiplanar CT image reconstructions and MIPs were obtained to evaluate the vascular anatomy. RADIATION DOSE REDUCTION: This exam was performed according to the departmental dose-optimization program which includes automated exposure control, adjustment of the mA and/or kV according to patient size and/or use of iterative reconstruction technique. CONTRAST:  143m OMNIPAQUE IOHEXOL 350 MG/ML SOLN COMPARISON:  06/03/2021 FINDINGS: Cardiovascular: Heart size upper limits normal. No pericardial effusion. The RV is nondilated. Satisfactory opacification of pulmonary arteries noted, and there is no evidence of pulmonary emboli. Previous AVR. Aortic Root: --Valve: 2.2 cm --Sinuses: 3.8 cm --Sinotubular Junction: 3.6 cm Limitations by motion: Mild Thoracic Aorta: --Ascending Aorta: 4.4 cm (previously 4.4) --Aortic Arch: 4.2 cm --Descending Aorta: 2.9 cm Mediastinum/Nodes: No mass or adenopathy. Lungs/Pleura: No pleural effusion. Mild dependent atelectasis posteriorly in the lung bases. Lungs otherwise clear. Upper Abdomen: No acute findings Musculoskeletal: Anterior vertebral endplate spurring at multiple levels in the mid and lower thoracic spine. Right shoulder DJD. Review of the MIP images confirms the above findings. IMPRESSION: 1.  Stable 4.4 cm ascending thoracic aortic aneurysm without complicating features. Recommend annual imaging followup by CTA or MRA. This recommendation follows 2010 ACCF/AHA/AATS/ACR/ASA/SCA/SCAI/SIR/STS/SVM Guidelines for the Diagnosis and Management of Patients with Thoracic Aortic Disease. Circulation. 2010; 121: Q683-M196 2. Stable  changes of prior TAVR. Electronically Signed   By: Lucrezia Europe M.D.   On: 05/24/2022 10:19    Recent Labs: Lab Results  Component Value Date   WBC 8.5 10/19/2021   HGB 15.2 10/19/2021   PLT 177 10/19/2021   NA 143 05/16/2022   K 4.1 05/16/2022   CL 106 05/16/2022   CO2 24 05/16/2022   GLUCOSE 101 (H) 05/16/2022   BUN 28 (H) 05/16/2022   CREATININE 1.09 05/16/2022   BILITOT 0.5 03/07/2022   ALKPHOS 83 03/07/2022   AST 30 03/07/2022   ALT 34 03/07/2022   PROT 6.4 03/07/2022   ALBUMIN 4.2 03/07/2022   CALCIUM 9.6 05/16/2022   GFRAA 70 05/05/2021    Speciality Comments: No specialty comments available.  Procedures:  No procedures performed Allergies: Ciprofloxacin and Quinolones    Assessment / Plan:     Visit Diagnoses: Idiopathic chronic gout of multiple sites without tophus -He has not had any signs or symptoms of a gout flare.  He has clinically been doing well taking allopurinol 300 mg 1 tablet by mouth daily.  He continues to tolerate allopurinol without any side effects.  He has not needed to take colchicine recently.  Discussed the interaction between colchicine and Crestor.  According to the patient he has not had his prescription for colchicine refilled since 2019.  Since reducing his alcohol intake as recommended by his cardiologist he has not had any recent gout flares.  His uric acid was within the reasonable range: 4.2 on 10/19/2022.  We will check uric acid, hepatic function panel, and CBC today.  He will remain on allopurinol as prescribed.  He was advised to notify us if he develops signs or symptoms of a gout flare.  He will follow-up in the office in 6 months or sooner if needed.- Plan: Uric acid  Medication monitoring encounter -BMP updated on 05/16/22.  Uric acid within the desirable range: 4.2 on 10/19/22.  CBC, without a function panel, and CBC will be checked today. He is tolerating allopurinol without any side effects.  He is aware of the medication interaction  between Crestor and colchicine.  Discussed signs and symptoms to monitor for including symptoms of myopathy.  Plan: CBC with Differential/Platelet, Uric acid, Hepatic function panel  Primary osteoarthritis of both hands: He has PIP and DIP thickening consistent with osteoarthritis of both hands.  Complete fist formation noted.  No synovitis was noted on examination today.  Primary osteoarthritis of both knees: He has good range of motion of both knee joints on examination today.  No warmth or effusion was noted.  He has been going to physical therapy twice a week for lower extremity muscle strengthening which has been helpful.  Primary osteoarthritis of both feet: He is not experiencing any discomfort in his feet at this time.  He has good range of motion of both ankle joints with no tenderness or inflammation.  Trigger finger, right ring finger: Recurrent-He has been experiencing significant tenderness and locking in his right ring finger.  He has had 3 ultrasound-guided right trigger finger injections in the past.  Discussed that surgical intervention is the next recommendation to manage his symptoms.  I offered a referral to a hand surgeon but he would like  to hold off at this time.  I discussed the use of a splint or buddy tape to the adjacent finger to rest the flexor tendon. He will notify us when and if you would like referral to a hand specialist.  Trigger little finger of right hand: He has been experiencing some increased tenderness and intermittent locking but his symptoms have been minimal compared to his right ring finger.  Quadriceps tendon rupture, left, sequela - He had a left revision quadriceps tendon reconstruction on 07/02/19 performed by Dr. Elisabeth Most.  He has been going to PT twice weekly and performing strengthening exercises 3-4 days per week at the gym. His ROM has improved.  He has no warmth or effusion on examination today.   Rupture of quadriceps tendon, right, sequela: He has  good ROM of the right knee joint on examination.  No warmth or effusion noted.  He has been going to PT twice weekly and the gym 3-4 days per week to perform strengthening exercises.    Other medical conditions are listed as follows:   Subdural hematoma (HCC)  H/O aortic valve replacement  Polio  Neuropathy  Orders: Orders Placed This Encounter  Procedures   CBC with Differential/Platelet   Uric acid   Hepatic function panel   No orders of the defined types were placed in this encounter.    Follow-Up Instructions: Return in about 6 months (around 11/23/2022) for Gout, Osteoarthritis.   Ofilia Neas, PA-C  Note - This record has been created using Dragon software.  Chart creation errors have been sought, but may not always  have been located. Such creation errors do not reflect on  the standard of medical care.

## 2022-05-16 ENCOUNTER — Other Ambulatory Visit: Payer: Self-pay

## 2022-05-16 DIAGNOSIS — Z01812 Encounter for preprocedural laboratory examination: Secondary | ICD-10-CM

## 2022-05-16 DIAGNOSIS — I10 Essential (primary) hypertension: Secondary | ICD-10-CM

## 2022-05-16 DIAGNOSIS — I2510 Atherosclerotic heart disease of native coronary artery without angina pectoris: Secondary | ICD-10-CM

## 2022-05-17 ENCOUNTER — Other Ambulatory Visit: Payer: Medicare Other

## 2022-05-17 LAB — BASIC METABOLIC PANEL
BUN/Creatinine Ratio: 26 — ABNORMAL HIGH (ref 10–24)
BUN: 28 mg/dL — ABNORMAL HIGH (ref 8–27)
CO2: 24 mmol/L (ref 20–29)
Calcium: 9.6 mg/dL (ref 8.6–10.2)
Chloride: 106 mmol/L (ref 96–106)
Creatinine, Ser: 1.09 mg/dL (ref 0.76–1.27)
Glucose: 101 mg/dL — ABNORMAL HIGH (ref 70–99)
Potassium: 4.1 mmol/L (ref 3.5–5.2)
Sodium: 143 mmol/L (ref 134–144)
eGFR: 72 mL/min/{1.73_m2} (ref >59)

## 2022-05-23 ENCOUNTER — Other Ambulatory Visit: Payer: Medicare Other

## 2022-05-24 ENCOUNTER — Ambulatory Visit (HOSPITAL_COMMUNITY)
Admission: RE | Admit: 2022-05-24 | Discharge: 2022-05-24 | Disposition: A | Discharge status: Home / Self Care | Payer: Medicare Other | Source: Ambulatory Visit | Attending: Physician Assistant | Admitting: Physician Assistant

## 2022-05-24 ENCOUNTER — Ambulatory Visit (HOSPITAL_BASED_OUTPATIENT_CLINIC_OR_DEPARTMENT_OTHER): Payer: Medicare Other

## 2022-05-24 ENCOUNTER — Other Ambulatory Visit: Payer: Self-pay

## 2022-05-24 ENCOUNTER — Other Ambulatory Visit: Payer: Medicare Other

## 2022-05-24 ENCOUNTER — Ambulatory Visit (INDEPENDENT_AMBULATORY_CARE_PROVIDER_SITE_OTHER): Payer: Medicare Other | Admitting: Physician Assistant

## 2022-05-24 ENCOUNTER — Encounter: Payer: Self-pay | Admitting: Physician Assistant

## 2022-05-24 VITALS — BP 135/77 | HR 57 | Resp 16 | Ht 72.0 in | Wt 212.0 lb

## 2022-05-24 DIAGNOSIS — S76111S Strain of right quadriceps muscle, fascia and tendon, sequela: Secondary | ICD-10-CM

## 2022-05-24 DIAGNOSIS — M65351 Trigger finger, right little finger: Secondary | ICD-10-CM

## 2022-05-24 DIAGNOSIS — M19041 Primary osteoarthritis, right hand: Secondary | ICD-10-CM | POA: Diagnosis not present

## 2022-05-24 DIAGNOSIS — Z5181 Encounter for therapeutic drug level monitoring: Secondary | ICD-10-CM

## 2022-05-24 DIAGNOSIS — M170 Bilateral primary osteoarthritis of knee: Secondary | ICD-10-CM | POA: Diagnosis not present

## 2022-05-24 DIAGNOSIS — M19072 Primary osteoarthritis, left ankle and foot: Secondary | ICD-10-CM

## 2022-05-24 DIAGNOSIS — I7121 Aneurysm of the ascending aorta, without rupture: Secondary | ICD-10-CM | POA: Insufficient documentation

## 2022-05-24 DIAGNOSIS — Z952 Presence of prosthetic heart valve: Secondary | ICD-10-CM | POA: Diagnosis present

## 2022-05-24 DIAGNOSIS — S76112S Strain of left quadriceps muscle, fascia and tendon, sequela: Secondary | ICD-10-CM

## 2022-05-24 DIAGNOSIS — M1A09X0 Idiopathic chronic gout, multiple sites, without tophus (tophi): Secondary | ICD-10-CM

## 2022-05-24 DIAGNOSIS — S065XAA Traumatic subdural hemorrhage with loss of consciousness status unknown, initial encounter: Secondary | ICD-10-CM

## 2022-05-24 DIAGNOSIS — E782 Mixed hyperlipidemia: Secondary | ICD-10-CM

## 2022-05-24 DIAGNOSIS — M19071 Primary osteoarthritis, right ankle and foot: Secondary | ICD-10-CM

## 2022-05-24 DIAGNOSIS — M65341 Trigger finger, right ring finger: Secondary | ICD-10-CM

## 2022-05-24 DIAGNOSIS — G629 Polyneuropathy, unspecified: Secondary | ICD-10-CM

## 2022-05-24 DIAGNOSIS — M19042 Primary osteoarthritis, left hand: Secondary | ICD-10-CM

## 2022-05-24 DIAGNOSIS — A809 Acute poliomyelitis, unspecified: Secondary | ICD-10-CM

## 2022-05-24 LAB — ECHOCARDIOGRAM COMPLETE
AR max vel: 1.15 cm2
AV Area VTI: 1.1 cm2
AV Area mean vel: 1.22 cm2
AV Mean grad: 28 mmHg
AV Peak grad: 53.1 mmHg
Ao pk vel: 3.65 m/s
Area-P 1/2: 3.1 cm2
P 1/2 time: 759 msec
S' Lateral: 3.9 cm

## 2022-05-24 MED ORDER — IOHEXOL 350 MG/ML SOLN
100.0000 mL | Freq: Once | INTRAVENOUS | Status: AC | PRN
  Administered 2022-05-24: 100 mL via INTRAVENOUS

## 2022-05-24 NOTE — Progress Notes (Signed)
Will place order for lab work.

## 2022-05-25 ENCOUNTER — Telehealth: Payer: Self-pay

## 2022-05-25 ENCOUNTER — Other Ambulatory Visit: Payer: Medicare Other

## 2022-05-25 DIAGNOSIS — E782 Mixed hyperlipidemia: Secondary | ICD-10-CM

## 2022-05-25 LAB — CBC WITH DIFFERENTIAL/PLATELET
Absolute Monocytes: 873 cells/uL (ref 200–950)
Basophils Absolute: 52 cells/uL (ref 0–200)
Basophils Relative: 0.7 %
Eosinophils Absolute: 207 cells/uL (ref 15–500)
Eosinophils Relative: 2.8 %
HCT: 43.1 % (ref 38.5–50.0)
Hemoglobin: 14.7 g/dL (ref 13.2–17.1)
Lymphs Abs: 1450 cells/uL (ref 850–3900)
MCH: 32.4 pg (ref 27.0–33.0)
MCHC: 34.1 g/dL (ref 32.0–36.0)
MCV: 94.9 fL (ref 80.0–100.0)
MPV: 11.5 fL (ref 7.5–12.5)
Monocytes Relative: 11.8 %
Neutro Abs: 4817 cells/uL (ref 1500–7800)
Neutrophils Relative %: 65.1 %
Platelets: 169 10*3/uL (ref 140–400)
RBC: 4.54 10*6/uL (ref 4.20–5.80)
RDW: 12.8 % (ref 11.0–15.0)
Total Lymphocyte: 19.6 %
WBC: 7.4 10*3/uL (ref 3.8–10.8)

## 2022-05-25 LAB — HEPATIC FUNCTION PANEL
AG Ratio: 1.8 (calc) (ref 1.0–2.5)
ALT: 30 U/L (ref 9–46)
ALT: 28 IU/L (ref 0–44)
AST: 29 U/L (ref 10–35)
AST: 31 IU/L (ref 0–40)
Albumin: 4.2 g/dL (ref 3.6–5.1)
Albumin: 4.3 g/dL (ref 3.7–4.7)
Alkaline Phosphatase: 68 IU/L (ref 44–121)
Alkaline phosphatase (APISO): 61 U/L (ref 35–144)
Bilirubin Total: 0.4 mg/dL (ref 0.0–1.2)
Bilirubin, Direct: 0.1 mg/dL (ref 0.0–0.2)
Bilirubin, Direct: 0.15 mg/dL (ref 0.00–0.40)
Globulin: 2.3 g/dL (calc) (ref 1.9–3.7)
Indirect Bilirubin: 0.3 mg/dL (calc) (ref 0.2–1.2)
Total Bilirubin: 0.4 mg/dL (ref 0.2–1.2)
Total Protein: 6.5 g/dL (ref 6.1–8.1)
Total Protein: 6.2 g/dL (ref 6.0–8.5)

## 2022-05-25 LAB — LIPID PANEL
Chol/HDL Ratio: 2.1 ratio (ref 0.0–5.0)
Cholesterol, Total: 142 mg/dL (ref 100–199)
HDL: 67 mg/dL (ref >39)
LDL Chol Calc (NIH): 52 mg/dL (ref 0–99)
Triglycerides: 133 mg/dL (ref 0–149)
VLDL Cholesterol Cal: 23 mg/dL (ref 5–40)

## 2022-05-25 LAB — URIC ACID: Uric Acid, Serum: 2.8 mg/dL — ABNORMAL LOW (ref 4.0–8.0)

## 2022-05-25 NOTE — Progress Notes (Signed)
Uric acid is low in the desirable range.  CBC and CMP are normal.

## 2022-05-25 NOTE — Progress Notes (Signed)
History of Present Illness:    71 y.o. with history of GERD, HTN,HLD, spontaneous CNS bleed with craniotomy 2016 when living in [place]. Bicuspid AV with severe AS and dilated aortic root 4.3 cm. Had TAVR with 26 mm Edwards Sapien valve 12/23/19.  Cath prior to procedure showed only obstructive CAD in diagonal  Rx medically Post op TTE with normal EF mean gradient 11 mmHg no PVL. Plavix now d/c due to history of ICH  TTE 12/22/20 had new mild central AR EF 60-65% mean gradient 14 peak 26 mmHg with DVI 0.30 EF 50-55%  TTE 12/28/21 EF 50-55% mean gradient 24 mmHg peak 38.4 mmHg DVI 0.28 mild PVL  TTE 05/24/22 new moderate to severe central AR mean gradient 28 peak 53 mmhg Ao 4.5 cm  CTA chest not cardiac stable 4.4 cm aneurysm The TAVR valve looks well positioned and not deep and leaflets look ok in single non gated phase with no HALT/HAM  LDL  86 03/07/22  on lipitor 20 mg He did not want to increase dose and NP then changed him to Crestor 40 mg daily   Diastolic pressures have been high Given low normal EF , aneurysm wanted patient to start on ARB he is already on a beta blocker   Has prostate cancer and having radioactive seeds placed in [place]    Reviewed his CTA and TTE with him He has developed higher gradients and central leak since implant CTA really shows relatively good position and not too deep to my eye Although CTA not gated leaflets looked normal with only minor thickening at base.   Discussed f/u TEE when he gets back from [place] Only issue would be need for balloon Rx which I don't think would help central AR if valve not under-expanded. There is no hour glass form on CT. Or use of anticoagulation for HALT/HAM which is not really appreciated on current CT  Risks of TEE including esophageal injury and intubation discussed willing to proceed Will try to schedule for 7/17 as he is traveling the rest of this month   Past Medical History:  Diagnosis Date   Arthritis    R shoulder, great  toes, hands- has gout & has injections in knees with Dr. Estanislado Pandy    Bicuspid aortic valve with ascending aorta 4.0 to 4.5 cm in diameter    CAD (coronary artery disease)    Cancer (HCC)    skin ca-    GERD (gastroesophageal reflux disease)    Headache    History of hiatal hernia    Hypertension    Prostate cancer ([place])    S/P TAVR (transcatheter aortic valve replacement)    SDH (subdural hematoma) (HCC)    Severe aortic stenosis    Thoracic ascending aortic aneurysm (HCC)    Vertebral artery aneurysm ([place])     Past Surgical History:  Procedure Laterality Date   BRAIN SURGERY  2016   evacuation of SDH   CARDIAC CATHETERIZATION     CARPAL TUNNEL RELEASE Bilateral    GREEN LIGHT LASER TURP (TRANSURETHRAL RESECTION OF PROSTATE  04/27/2017   KNEE ARTHROPLASTY     LEFT HEART CATH AND CORONARY ANGIOGRAPHY N/A 10/08/2019   Procedure: LEFT HEART CATH AND CORONARY ANGIOGRAPHY;  Surgeon: Sherren Mocha, MD;  Location: [facility];  Service: Cardiovascular;  Laterality: N/A;   QUADRICEPS TENDON REPAIR Bilateral 06/15/2017   Procedure: REPAIR QUADRICEP TENDON;  Surgeon: Meredith Pel, MD;  Location: [facility];  Service: Orthopedics;  Laterality: Bilateral;   SHOULDER SURGERY Right    TRANSCATHETER AORTIC VALVE REPLACEMENT, TRANSFEMORAL  12/23/2019   TRANSCATHETER AORTIC VALVE REPLACEMENT, TRANSFEMORAL N/A 12/23/2019   Procedure: TRANSCATHETER AORTIC VALVE REPLACEMENT, TRANSFEMORAL;  Surgeon: Sherren Mocha, MD;  Location: [facility];  Service: Open Heart Surgery;  Laterality: N/A;   VASCULAR SURGERY      Current Medications: No outpatient medications have been marked as taking for the 05/30/22 encounter (Appointment) with Josue Hector, MD.     Allergies:   Ciprofloxacin and Quinolones   Social History   Socioeconomic History   Marital status: Divorced    Spouse name: Not on file   Number of children: Not on file   Years of education: Not on file   Highest education level:  Not on file  Occupational History   Not on file  Tobacco Use   Smoking status: Never   Smokeless tobacco: Never  Vaping Use   Vaping Use: Never used  Substance and Sexual Activity   Alcohol use: Yes    Alcohol/week: 8.0 standard drinks of alcohol    Types: 7 Glasses of wine, 1 Shots of liquor per week   Drug use: No   Sexual activity: Not on file  Other Topics Concern   Not on file  Social History Narrative   Not on file   Social Determinants of Health   Financial Resource Strain: Not on file  Food Insecurity: Not on file  Transportation Needs: Not on file  Physical Activity: Not on file  Stress: Not on file  Social Connections: Not on file     Family History: The patient's family history includes Diabetes in his brother; Heart disease in his father; Parkinson's disease in his brother and mother.  ROS:   Please see the history of present illness.    All other systems reviewed and are negative.  EKGs/Labs/Other Studies Reviewed:    The following studies were reviewed today: TAVR OPERATIVE NOTE     Date of Procedure:                12/23/2019   Preoperative Diagnosis:      Severe Aortic Stenosis    Postoperative Diagnosis:    Same    Procedure:        Transcatheter Aortic Valve Replacement - Percutaneous Transfemoral Approach             Edwards Sapien 3 UltraTHV (size 26 mm, model # 9750TFX, serial # [id number])              Co-Surgeons:                        Gaye Pollack, MD and Sherren Mocha, MD   Anesthesiologist:                  Duane Boston, MD   Echocardiographer:              [name], MD   Pre-operative Echo Findings: Severe bicuspid aortic stenosis Normal left ventricular systolic function   Post-operative Echo Findings: No paravalvular leak Normal left ventricular systolic function   ______________   12/24/19 ECHO IMPRESSIONS  1. Left ventricular ejection fraction, by visual estimation, is 60 to 65%. The left ventricle has normal  function. There is mildly increased left ventricular hypertrophy.  2. Left ventricular diastolic parameters are consistent with Grade I diastolic dysfunction (impaired relaxation).  3. The left ventricle has no regional wall motion abnormalities.  4. Global right ventricle has normal systolic function.The right ventricular size is normal. No increase in right ventricular wall thickness.  5. Left atrial size was normal.  6. Right atrial size was normal.  7. The mitral valve is normal in structure. No evidence of mitral valve regurgitation. No evidence of mitral stenosis.  8. The tricuspid valve is normal in structure.  9. Aortic valve regurgitation is not visualized. No evidence of aortic valve sclerosis or stenosis. 10. The pulmonic valve was normal in structure. Pulmonic valve regurgitation is not visualized. 11. There is moderate dilatation of the ascending aorta measuring 45 mm. 12. The inferior vena cava is normal in size with greater than 50% respiratory variability, suggesting right atrial pressure of 3 mmHg. 13. S/P TAVR with a 26 mm Edwards-SAPIEN 3 Ultra valve, peak/mean gradients 17/11 mmHg. No paravalvular leak.   Aortic Valve: The aortic valve has been repaired/replaced. Aortic valve regurgitation is not visualized. The aortic valve is structurally normal, with no evidence of sclerosis or stenosis. Aortic valve mean gradient measures 10.5 mmHg. Aortic valve peak  gradient measures 17.0 mmHg. Aortic valve area, by VTI measures 3.38 cm. 26 Edwards Sapien bioprosthetic, stented aortic valve (TAVR) valve is present in the aortic position.   __________________   Echo 01/15/20 IMPRESSIONS   1. Left ventricular ejection fraction, by visual estimation, is 55 to 60%. The left ventricle has normal function. There is mildly increased left ventricular hypertrophy.   2. Left ventricular diastolic parameters are consistent with Grade I diastolic dysfunction (impaired relaxation).   3. The left  ventricle has no regional wall motion abnormalities.   4. Global right ventricle has normal systolic function.The right ventricular size is normal. No increase in right ventricular wall thickness.   5. Left atrial size was mild-moderately dilated.   6. Right atrial size was normal.   7. The mitral valve is normal in structure. Trivial mitral valve regurgitation.   8. The tricuspid valve is normal in structure.   9. Aortic stent-valve (TAVR) prosthesis is well seated, without perivalvular leak.  10. Aortic valve mean gradient measures 13.7 mmHg.  11. Aortic valve peak gradient measures 24.4 mmHg.  12. Aortic valve regurgitation is not visualized.  13. The pulmonic valve was not well visualized. Pulmonic valve regurgitation is not visualized.  14. Aortic dilatation noted.  15. There is mild to moderate dilatation of the ascending aorta measuring  44 mm.  16. No significant change from prior study.  17. Prior images reviewed side by side.  18. When compared to the 12/24/2019 study, the TAVR gradients are very slightly higher. The changes in TAVR dimensionless index and calculated valve area are due to differences in LVOT pulsed wave sampling technique, rather than a true change in valve hemodynamics.   _______________  CT ANGIOGRAPHY CHEST WITH CONTRAST  06/03/21   MPRESSION: Unchanged size and appearance of ascending aorta, estimated 4.4 cm maximum dimension on the current CT. Recommend annual imaging followup by CTA or MRA. This recommendation follows 2010 ACCF/AHA/AATS/ACR/ASA/SCA/SCAI/SIR/STS/SVM Guidelines for the Diagnosis and Management of Patients with Thoracic Aortic Disease. Circulation. 2010; 121: X412-I786. Aortic aneurysm NOS (ICD10-I71.9)   Redemonstration of changes of prior TAVR   Aortic Atherosclerosis (ICD10-I70.0).   Signed,   Dulcy Fanny. Wagner, DO, RPVI __________________  Echo 12/28/21 IMPRESSIONS   1. Left ventricular ejection fraction, by estimation, is 50 to  55%. The  left ventricle has low normal function. The left ventricle demonstrates  global hypokinesis. There is mild left ventricular hypertrophy. Left  ventricular diastolic parameters are  consistent with Grade I diastolic dysfunction (impaired relaxation).   2. Right ventricular systolic function is mildly reduced. The right  ventricular size is normal. Tricuspid regurgitation signal is inadequate  for assessing PA pressure.   3. Left atrial size was mildly dilated.   4. The mitral valve is normal in structure. Trivial mitral valve  regurgitation. No evidence of mitral stenosis.   5. Bioprosthetic aortic valve s/p TAVR, 26 mm Edwards Sapien THV. Mean  gradient 24 mmHg which is elevated (22 mmHg on previous study).  Dimensionless index 0.3. Mild perivalvular leakage.   6. Aortic dilatation noted. There is moderate dilatation of the ascending  aorta, measuring 44 mm.   7. IVC not visualized.   Echo 12/22/20 IMPRESSIONS  1. Left ventricular ejection fraction, by estimation, is 60 to 65%. The  left ventricle has normal function. The left ventricle has no regional  wall motion abnormalities. There is mild left ventricular hypertrophy.  Left ventricular diastolic parameters  are consistent with Grade I diastolic dysfunction (impaired relaxation).   2. Right ventricular systolic function is normal. The right ventricular  size is mildly enlarged.   3. The mitral valve is normal in structure. Trivial mitral valve  regurgitation. No evidence of mitral stenosis.   4. Mild TAVR valve regurgitation. The aortic valve has been  repaired/replaced. Aortic valve regurgitation is mild. No aortic stenosis  is present. There is a 26 mm Sapien prosthetic (TAVR) valve present in the  aortic position. Procedure Date: 12/23/19.  Aortic regurgitation PHT measures 833 msec. Aortic valve mean gradient  measures 14.0 mmHg. Aortic valve Vmax measures 2.56 m/s.   5. Aortic dilatation noted. There is moderate  dilatation of the ascending  aorta, measuring 46 mm.   6. The inferior vena cava is normal in size with greater than 50%  respiratory variability, suggesting right atrial pressure of 3 mmHg.   Comparison(s): When compared to prior, there is a mild aortic  regurgitation signal.    EKG:     SR rate 64 LAD 01/01/20   Recent Labs: 05/16/2022: BUN 28; Creatinine, Ser 1.09; Potassium 4.1; Sodium 143 05/24/2022: ALT 30; Hemoglobin 14.7; Platelets 169  Recent Lipid Panel    Component Value Date/Time   CHOL 188 04/19/2022 0854   TRIG 117 04/19/2022 0854   HDL 70 04/19/2022 0854   CHOLHDL 2.7 04/19/2022 0854   CHOLHDL 3.7 CALC 12/25/2007 1636   VLDL 25 12/25/2007 1636   LDLCALC 98 04/19/2022 0854   LDLDIRECT 164.4 12/25/2007 1636    Physical Exam:    VS:  There were no vitals taken for this visit.    Wt Readings from Last 3 Encounters:  05/24/22 212 lb (96.2 kg)  04/25/22 204 lb (92.5 kg)  01/10/22 197 lb (89.4 kg)     Affect appropriate Healthy:  appears stated age 26: normal Neck supple with no adenopathy JVP normal no bruits no thyromegaly Lungs clear with no wheezing and good diaphragmatic motion Heart:  S1/S2 SEM/AR  murmur, no rub, gallop or click PMI normal Abdomen: benighn, BS positve, no tenderness, no AAA no bruit.  No HSM or HJR Distal pulses intact with no bruits No edema Neuro non-focal Skin warm and dry No muscular weakness    PLAN:    In order of problems listed above:  1.S/p TAVR: 12/23/19 with 26 mm Sapien 3 valve gradients  elevated with worsening central AR ? Deep deployment and under expansion not apparent on TTE/CT will have structural  team review. TEE scheduled for 06/1722 Orders done and endoscopy called   2. HTN:  On Toprol ARB added f/u home monitoring normal   3. Aneursym: CT scan 06/23/22 4.4 cm ascending aortic root  History of bicuspid AV F/U CT June 2024 on beta blocker and ARB   4. CAD: Cath prior to TAVR 10/08/19 with 75% small D1 and  50% mid LAD. No chest pain. Continue medical therapy with ASA, statin and BB. CM note indicates D1 not suitable for PCI  Continue beta blocker, baby asa, statin Plavix d/c with history of ICH  LDL at goal   5. HLD:  now on crestor repeat labs in 2 months      Medication Adjustments/Labs and Tests Ordered:  TEE  Time : spent reviewing Cath, multiple echos CT chest direct patient interview , composing note 3and arranging TEE with orders 60 minutes   Signed, [name], MD  05/25/2022 12:50 PM    [place]

## 2022-05-25 NOTE — Telephone Encounter (Signed)
Called patient to let him know he needs to be seen in person with Dr. Johnsie Cancel, and not a virtual visit due to his results of his echo. Rescheduled patient with Dr. Johnsie Cancel on 05/30/22.

## 2022-05-25 NOTE — Telephone Encounter (Signed)
Called patient and changed appointment to in person and moved appointment to Tuesday.

## 2022-05-26 ENCOUNTER — Encounter: Payer: Self-pay | Admitting: Cardiovascular Disease

## 2022-05-29 ENCOUNTER — Other Ambulatory Visit: Payer: Self-pay

## 2022-05-29 DIAGNOSIS — Z952 Presence of prosthetic heart valve: Secondary | ICD-10-CM

## 2022-05-29 DIAGNOSIS — I359 Nonrheumatic aortic valve disorder, unspecified: Secondary | ICD-10-CM

## 2022-05-30 ENCOUNTER — Encounter: Payer: Self-pay | Admitting: Cardiovascular Disease

## 2022-05-30 ENCOUNTER — Ambulatory Visit (INDEPENDENT_AMBULATORY_CARE_PROVIDER_SITE_OTHER): Payer: Medicare Other | Admitting: Cardiovascular Disease

## 2022-05-30 ENCOUNTER — Ambulatory Visit (HOSPITAL_COMMUNITY): Admission: RE | Admit: 2022-05-30 | Payer: Medicare Other | Source: Ambulatory Visit

## 2022-05-30 VITALS — BP 128/70 | HR 56 | Ht 72.0 in | Wt 211.0 lb

## 2022-05-30 DIAGNOSIS — Z952 Presence of prosthetic heart valve: Secondary | ICD-10-CM | POA: Diagnosis not present

## 2022-05-30 DIAGNOSIS — E782 Mixed hyperlipidemia: Secondary | ICD-10-CM

## 2022-05-30 DIAGNOSIS — I10 Essential (primary) hypertension: Secondary | ICD-10-CM | POA: Diagnosis not present

## 2022-05-30 DIAGNOSIS — I2510 Atherosclerotic heart disease of native coronary artery without angina pectoris: Secondary | ICD-10-CM

## 2022-05-30 DIAGNOSIS — I7121 Aneurysm of the ascending aorta, without rupture: Secondary | ICD-10-CM

## 2022-05-30 NOTE — Patient Instructions (Signed)
Medication Instructions:  Your physician recommends that you continue on your current medications as directed. Please refer to the Current Medication list given to you today.  *If you need a refill on your cardiac medications before your next appointment, please call your pharmacy*  Lab Work: Your physician recommends that you have lab work week prior to test on Monday, July 17th for BMET and CBC.  If you have labs (blood work) drawn today and your tests are completely normal, you will receive your results only by: [place] (if you have MyChart) OR A paper copy in the mail If you have any lab test that is abnormal or we need to change your treatment, we will call you to review the results.  Testing/Procedures: Your physician has requested that you have a TEE. During a TEE, sound waves are used to create images of your heart. It provides your doctor with information about the size and shape of your heart and how well your heart's chambers and valves are working. In this test, a transducer is attached to the end of a flexible tube that's guided down your throat and into your esophagus (the tube leading from you mouth to your stomach) to get a more detailed image of your heart. You are not awake for the procedure. Please see the instruction sheet given to you today. For further information please visit [email or web address].  Follow-Up: At [facility], you and your health needs are our priority.  As part of our continuing mission to provide you with exceptional heart care, we have created designated Provider Care Teams.  These Care Teams include your primary Cardiologist (physician) and Advanced Practice Providers (APPs -  Physician Assistants and Nurse Practitioners) who all work together to provide you with the care you need, when you need it.  We recommend signing up for the patient portal called "MyChart".  Sign up information is provided on this After Visit Summary.  MyChart is used to  connect with patients for Virtual Visits (Telemedicine).  Patients are able to view lab/test results, encounter notes, upcoming appointments, etc.  Non-urgent messages can be sent to your provider as well.   To learn more about what you can do with MyChart, go to [email or web address].    Your next appointment:   7 month(s)  The format for your next appointment:   In Person  Provider:   [name], MD {  Other Instructions  You are scheduled for a TEE on 07/03/22  with Dr. Johnsie Cancel.  Please arrive at the [facility] (Main Entrance A) at [facility]: [address] [place], [place] [address] at 9:00 am.   DIET: Nothing to eat or drink after midnight except a sip of water with medications (see medication instructions below)  FYI: For your safety, and to allow [place] to monitor your vital signs accurately during the surgery/procedure we request that   if you have artificial nails, gel coating, SNS etc. Please have those removed prior to your surgery/procedure. Not having the nail coverings /polish removed may result in cancellation or delay of your surgery/procedure.   Medication Instructions: You do not need to hold any of your medications  Labs:   Go to [facility] the week before your procedure to get lab work.  You must have a responsible person to drive you home and stay in the waiting area during your procedure. Failure to do so could result in cancellation.  Bring your insurance cards.  *Special Note: Every effort is made to  have your procedure done on time. Occasionally there are emergencies that occur at the hospital that may cause delays. Please be patient if a delay does occur.    Important Information About Sugar

## 2022-06-01 ENCOUNTER — Telehealth: Payer: Medicare Other | Admitting: Cardiovascular Disease

## 2022-06-26 ENCOUNTER — Encounter (HOSPITAL_COMMUNITY): Payer: Self-pay | Admitting: Cardiovascular Disease

## 2022-06-26 NOTE — Telephone Encounter (Signed)
He had right ring finger injection x2 in different areas.  We should be okay to schedule another injection.

## 2022-06-26 NOTE — Telephone Encounter (Signed)
Please advise.  Patient has had 3 injections in the right ring trigger finger in the past.

## 2022-06-26 NOTE — Progress Notes (Signed)
Attempted to obtain medical history via telephone, unable to reach at this time. HIPAA compliant voicemail message left requesting return call to pre surgical testing department. 

## 2022-06-30 ENCOUNTER — Other Ambulatory Visit: Payer: Self-pay | Admitting: Cardiovascular Disease

## 2022-06-30 ENCOUNTER — Encounter: Payer: Self-pay | Admitting: Cardiovascular Disease

## 2022-06-30 DIAGNOSIS — I350 Nonrheumatic aortic (valve) stenosis: Secondary | ICD-10-CM

## 2022-07-03 ENCOUNTER — Ambulatory Visit (HOSPITAL_BASED_OUTPATIENT_CLINIC_OR_DEPARTMENT_OTHER): Payer: Medicare Other | Admitting: Anesthesiology

## 2022-07-03 ENCOUNTER — Ambulatory Visit (HOSPITAL_COMMUNITY): Payer: Medicare Other | Admitting: Anesthesiology

## 2022-07-03 ENCOUNTER — Ambulatory Visit (HOSPITAL_COMMUNITY)
Admission: RE | Admit: 2022-07-03 | Discharge: 2022-07-03 | Disposition: A | Discharge status: Home / Self Care | Payer: Medicare Other | Source: Ambulatory Visit | Attending: Cardiovascular Disease | Admitting: Cardiovascular Disease

## 2022-07-03 ENCOUNTER — Ambulatory Visit (HOSPITAL_BASED_OUTPATIENT_CLINIC_OR_DEPARTMENT_OTHER)
Admission: RE | Admit: 2022-07-03 | Discharge: 2022-07-03 | Disposition: A | Discharge status: Home / Self Care | Payer: Medicare Other | Source: Ambulatory Visit | Attending: Cardiovascular Disease | Admitting: Cardiovascular Disease

## 2022-07-03 ENCOUNTER — Other Ambulatory Visit: Payer: Self-pay

## 2022-07-03 ENCOUNTER — Encounter (HOSPITAL_COMMUNITY)
Admission: RE | Disposition: A | Discharge status: Home / Self Care | Payer: Self-pay | Source: Ambulatory Visit | Attending: Cardiovascular Disease

## 2022-07-03 ENCOUNTER — Encounter (HOSPITAL_COMMUNITY): Payer: Self-pay | Admitting: Cardiovascular Disease

## 2022-07-03 DIAGNOSIS — I083 Combined rheumatic disorders of mitral, aortic and tricuspid valves: Secondary | ICD-10-CM | POA: Diagnosis not present

## 2022-07-03 DIAGNOSIS — I350 Nonrheumatic aortic (valve) stenosis: Secondary | ICD-10-CM

## 2022-07-03 DIAGNOSIS — Z8546 Personal history of malignant neoplasm of prostate: Secondary | ICD-10-CM | POA: Insufficient documentation

## 2022-07-03 DIAGNOSIS — I714 Abdominal aortic aneurysm, without rupture, unspecified: Secondary | ICD-10-CM | POA: Diagnosis not present

## 2022-07-03 DIAGNOSIS — E785 Hyperlipidemia, unspecified: Secondary | ICD-10-CM | POA: Diagnosis not present

## 2022-07-03 DIAGNOSIS — I10 Essential (primary) hypertension: Secondary | ICD-10-CM

## 2022-07-03 DIAGNOSIS — Z7982 Long term (current) use of aspirin: Secondary | ICD-10-CM | POA: Diagnosis not present

## 2022-07-03 DIAGNOSIS — Z952 Presence of prosthetic heart valve: Secondary | ICD-10-CM | POA: Insufficient documentation

## 2022-07-03 DIAGNOSIS — Z79899 Other long term (current) drug therapy: Secondary | ICD-10-CM | POA: Insufficient documentation

## 2022-07-03 DIAGNOSIS — I2510 Atherosclerotic heart disease of native coronary artery without angina pectoris: Secondary | ICD-10-CM

## 2022-07-03 DIAGNOSIS — D649 Anemia, unspecified: Secondary | ICD-10-CM | POA: Diagnosis not present

## 2022-07-03 HISTORY — PX: TEE WITHOUT CARDIOVERSION: SHX5443

## 2022-07-03 LAB — ECHO TEE
AV Mean grad: 8 mmHg
AV Peak grad: 15.2 mmHg
Ao pk vel: 1.95 m/s
P 1/2 time: 743 msec

## 2022-07-03 SURGERY — ECHOCARDIOGRAM, TRANSESOPHAGEAL
Anesthesia: Monitor Anesthesia Care

## 2022-07-03 MED ORDER — PROPOFOL 10 MG/ML IV BOLUS
INTRAVENOUS | Status: DC | PRN
  Administered 2022-07-03: 80 mg via INTRAVENOUS

## 2022-07-03 MED ORDER — LIDOCAINE 2% (20 MG/ML) 5 ML SYRINGE
INTRAMUSCULAR | Status: DC | PRN
  Administered 2022-07-03: 100 mg via INTRAVENOUS

## 2022-07-03 MED ORDER — SODIUM CHLORIDE 0.9 % IV SOLN: INTRAVENOUS | Status: DC

## 2022-07-03 MED ORDER — PROPOFOL 500 MG/50ML IV EMUL
INTRAVENOUS | Status: DC | PRN
  Administered 2022-07-03: 125 ug/kg/min via INTRAVENOUS

## 2022-07-03 NOTE — H&P (Signed)
History of Present Illness:     71 y.o. with history of GERD, HTN,HLD, spontaneous CNS bleed with craniotomy 2016 when living in [place]. Bicuspid AV with severe AS and dilated aortic root 4.3 cm. Had TAVR with 26 mm Edwards Sapien valve 12/23/19.  Cath prior to procedure showed only obstructive CAD in diagonal  Rx medically Post op TTE with normal EF mean gradient 11 mmHg no PVL. Plavix now d/c due to history of ICH   TTE 12/22/20 had new mild central AR EF 60-65% mean gradient 14 peak 26 mmHg with DVI 0.30 EF 50-55%  TTE 12/28/21 EF 50-55% mean gradient 24 mmHg peak 38.4 mmHg DVI 0.28 mild PVL  TTE 05/24/22 new moderate to severe central AR mean gradient 28 peak 53 mmhg Ao 4.5 cm   CTA chest not cardiac stable 4.4 cm aneurysm The TAVR valve looks well positioned and not deep and leaflets look ok in single non gated phase with no HALT/HAM   LDL  86 03/07/22  on lipitor 20 mg He did not want to increase dose and NP then changed him to Crestor 40 mg daily    Diastolic pressures have been high Given low normal EF , aneurysm wanted patient to start on ARB he is already on a beta blocker    Has prostate cancer and having radioactive seeds placed in [place]    Reviewed his CTA and TTE with him He has developed higher gradients and central leak since implant CTA really shows relatively good position and not too deep to my eye Although CTA not gated leaflets looked normal with only minor thickening at base.    Discussed f/u TEE when he gets back from [place] Only issue would be need for balloon Rx which I don't think would help central AR if valve not under-expanded. There is no hour glass form on CT. Or use of anticoagulation for HALT/HAM which is not really appreciated on current CT   Risks of TEE including esophageal injury and intubation discussed willing to proceed Will try to schedule for 7/17 as he is traveling the rest of this month        Past Medical History:  Diagnosis Date   Arthritis       R shoulder, great toes, hands- has gout & has injections in knees with Dr. Estanislado Pandy    Bicuspid aortic valve with ascending aorta 4.0 to 4.5 cm in diameter     CAD (coronary artery disease)     Cancer (HCC)      skin ca-    GERD (gastroesophageal reflux disease)     Headache     History of hiatal hernia     Hypertension     Prostate cancer ([place])     S/P TAVR (transcatheter aortic valve replacement)     SDH (subdural hematoma) (HCC)     Severe aortic stenosis     Thoracic ascending aortic aneurysm (HCC)     Vertebral artery aneurysm ([place])             Past Surgical History:  Procedure Laterality Date   BRAIN SURGERY   2016    evacuation of SDH   CARDIAC CATHETERIZATION       CARPAL TUNNEL RELEASE Bilateral     GREEN LIGHT LASER TURP (TRANSURETHRAL RESECTION OF PROSTATE   04/27/2017   KNEE ARTHROPLASTY       LEFT HEART CATH AND CORONARY ANGIOGRAPHY N/A 10/08/2019    Procedure: LEFT HEART CATH  AND CORONARY ANGIOGRAPHY;  Surgeon: Sherren Mocha, MD;  Location: [facility];  Service: Cardiovascular;  Laterality: N/A;   QUADRICEPS TENDON REPAIR Bilateral 06/15/2017    Procedure: REPAIR QUADRICEP TENDON;  Surgeon: Meredith Pel, MD;  Location: [facility];  Service: Orthopedics;  Laterality: Bilateral;   SHOULDER SURGERY Right     TRANSCATHETER AORTIC VALVE REPLACEMENT, TRANSFEMORAL   12/23/2019   TRANSCATHETER AORTIC VALVE REPLACEMENT, TRANSFEMORAL N/A 12/23/2019    Procedure: TRANSCATHETER AORTIC VALVE REPLACEMENT, TRANSFEMORAL;  Surgeon: Sherren Mocha, MD;  Location: [facility];  Service: Open Heart Surgery;  Laterality: N/A;   VASCULAR SURGERY          Current Medications: Active Medications  No outpatient medications have been marked as taking for the 05/30/22 encounter (Appointment) with Josue Hector, MD.        Allergies:   Ciprofloxacin and Quinolones    Social History         Socioeconomic History   Marital status: Divorced      Spouse name: Not on file    Number of children: Not on file   Years of education: Not on file   Highest education level: Not on file  Occupational History   Not on file  Tobacco Use   Smoking status: Never   Smokeless tobacco: Never  Vaping Use   Vaping Use: Never used  Substance and Sexual Activity   Alcohol use: Yes      Alcohol/week: 8.0 standard drinks of alcohol      Types: 7 Glasses of wine, 1 Shots of liquor per week   Drug use: No   Sexual activity: Not on file  Other Topics Concern   Not on file  Social History Narrative   Not on file    Social Determinants of Health    Financial Resource Strain: Not on file  Food Insecurity: Not on file  Transportation Needs: Not on file  Physical Activity: Not on file  Stress: Not on file  Social Connections: Not on file      Family History: The patient's family history includes Diabetes in his brother; Heart disease in his father; Parkinson's disease in his brother and mother.   ROS:   Please see the history of present illness.    All other systems reviewed and are negative.   EKGs/Labs/Other Studies Reviewed:     The following studies were reviewed today: TAVR OPERATIVE NOTE     Date of Procedure:                12/23/2019   Preoperative Diagnosis:      Severe Aortic Stenosis    Postoperative Diagnosis:    Same    Procedure:        Transcatheter Aortic Valve Replacement - Percutaneous Transfemoral Approach             Edwards Sapien 3 UltraTHV (size 26 mm, model # [id number], serial # [id number])              Co-Surgeons:                        Gaye Pollack, MD and Sherren Mocha, MD   Anesthesiologist:                  Duane Boston, MD   Echocardiographer:              [name], MD   Pre-operative Echo Findings: Severe bicuspid aortic stenosis  Normal left ventricular systolic function   Post-operative Echo Findings: No paravalvular leak Normal left ventricular systolic function   ______________   12/24/19 ECHO IMPRESSIONS  1.  Left ventricular ejection fraction, by visual estimation, is 60 to 65%. The left ventricle has normal function. There is mildly increased left ventricular hypertrophy.  2. Left ventricular diastolic parameters are consistent with Grade I diastolic dysfunction (impaired relaxation).  3. The left ventricle has no regional wall motion abnormalities.  4. Global right ventricle has normal systolic function.The right ventricular size is normal. No increase in right ventricular wall thickness.  5. Left atrial size was normal.  6. Right atrial size was normal.  7. The mitral valve is normal in structure. No evidence of mitral valve regurgitation. No evidence of mitral stenosis.  8. The tricuspid valve is normal in structure.  9. Aortic valve regurgitation is not visualized. No evidence of aortic valve sclerosis or stenosis. 10. The pulmonic valve was normal in structure. Pulmonic valve regurgitation is not visualized. 11. There is moderate dilatation of the ascending aorta measuring 45 mm. 12. The inferior vena cava is normal in size with greater than 50% respiratory variability, suggesting right atrial pressure of 3 mmHg. 13. S/P TAVR with a 26 mm Edwards-SAPIEN 3 Ultra valve, peak/mean gradients 17/11 mmHg. No paravalvular leak.   Aortic Valve: The aortic valve has been repaired/replaced. Aortic valve regurgitation is not visualized. The aortic valve is structurally normal, with no evidence of sclerosis or stenosis. Aortic valve mean gradient measures 10.5 mmHg. Aortic valve peak  gradient measures 17.0 mmHg. Aortic valve area, by VTI measures 3.38 cm. 26 Edwards Sapien bioprosthetic, stented aortic valve (TAVR) valve is present in the aortic position.   __________________   Echo 01/15/20 IMPRESSIONS   1. Left ventricular ejection fraction, by visual estimation, is 55 to 60%. The left ventricle has normal function. There is mildly increased left ventricular hypertrophy.   2. Left ventricular  diastolic parameters are consistent with Grade I diastolic dysfunction (impaired relaxation).   3. The left ventricle has no regional wall motion abnormalities.   4. Global right ventricle has normal systolic function.The right ventricular size is normal. No increase in right ventricular wall thickness.   5. Left atrial size was mild-moderately dilated.   6. Right atrial size was normal.   7. The mitral valve is normal in structure. Trivial mitral valve regurgitation.   8. The tricuspid valve is normal in structure.   9. Aortic stent-valve (TAVR) prosthesis is well seated, without perivalvular leak.  10. Aortic valve mean gradient measures 13.7 mmHg.  11. Aortic valve peak gradient measures 24.4 mmHg.  12. Aortic valve regurgitation is not visualized.  13. The pulmonic valve was not well visualized. Pulmonic valve regurgitation is not visualized.  14. Aortic dilatation noted.  15. There is mild to moderate dilatation of the ascending aorta measuring  44 mm.  16. No significant change from prior study.  17. Prior images reviewed side by side.  18. When compared to the 12/24/2019 study, the TAVR gradients are very slightly higher. The changes in TAVR dimensionless index and calculated valve area are due to differences in LVOT pulsed wave sampling technique, rather than a true change in valve hemodynamics.    _______________   CT ANGIOGRAPHY CHEST WITH CONTRAST  06/03/21    MPRESSION: Unchanged size and appearance of ascending aorta, estimated 4.4 cm maximum dimension on the current CT. Recommend annual imaging followup by CTA or MRA. This recommendation follows 2010 ACCF/AHA/AATS/ACR/ASA/SCA/SCAI/SIR/STS/SVM Guidelines  for the Diagnosis and Management of Patients with Thoracic Aortic Disease. Circulation. 2010; 121: Q595-G387. Aortic aneurysm NOS (ICD10-I71.9)   Redemonstration of changes of prior TAVR   Aortic Atherosclerosis (ICD10-I70.0).   Signed,   Dulcy Fanny. Wagner, DO,  RPVI __________________   Echo 12/28/21 IMPRESSIONS   1. Left ventricular ejection fraction, by estimation, is 50 to 55%. The  left ventricle has low normal function. The left ventricle demonstrates  global hypokinesis. There is mild left ventricular hypertrophy. Left  ventricular diastolic parameters are  consistent with Grade I diastolic dysfunction (impaired relaxation).   2. Right ventricular systolic function is mildly reduced. The right  ventricular size is normal. Tricuspid regurgitation signal is inadequate  for assessing PA pressure.   3. Left atrial size was mildly dilated.   4. The mitral valve is normal in structure. Trivial mitral valve  regurgitation. No evidence of mitral stenosis.   5. Bioprosthetic aortic valve s/p TAVR, 26 mm Edwards Sapien THV. Mean  gradient 24 mmHg which is elevated (22 mmHg on previous study).  Dimensionless index 0.3. Mild perivalvular leakage.   6. Aortic dilatation noted. There is moderate dilatation of the ascending  aorta, measuring 44 mm.   7. IVC not visualized.    Echo 12/22/20 IMPRESSIONS  1. Left ventricular ejection fraction, by estimation, is 60 to 65%. The  left ventricle has normal function. The left ventricle has no regional  wall motion abnormalities. There is mild left ventricular hypertrophy.  Left ventricular diastolic parameters  are consistent with Grade I diastolic dysfunction (impaired relaxation).   2. Right ventricular systolic function is normal. The right ventricular  size is mildly enlarged.   3. The mitral valve is normal in structure. Trivial mitral valve  regurgitation. No evidence of mitral stenosis.   4. Mild TAVR valve regurgitation. The aortic valve has been  repaired/replaced. Aortic valve regurgitation is mild. No aortic stenosis  is present. There is a 26 mm Sapien prosthetic (TAVR) valve present in the  aortic position. Procedure Date: 12/23/19.  Aortic regurgitation PHT measures 833 msec. Aortic valve mean  gradient  measures 14.0 mmHg. Aortic valve Vmax measures 2.56 m/s.   5. Aortic dilatation noted. There is moderate dilatation of the ascending  aorta, measuring 46 mm.   6. The inferior vena cava is normal in size with greater than 50%  respiratory variability, suggesting right atrial pressure of 3 mmHg.   Comparison(s): When compared to prior, there is a mild aortic  regurgitation signal.      EKG:     SR rate 64 LAD 01/01/20    Recent Labs: 05/16/2022: BUN 28; Creatinine, Ser 1.09; Potassium 4.1; Sodium 143 05/24/2022: ALT 30; Hemoglobin 14.7; Platelets 169  Recent Lipid Panel Labs (Brief)          Component Value Date/Time    CHOL 188 04/19/2022 0854    TRIG 117 04/19/2022 0854    HDL 70 04/19/2022 0854    CHOLHDL 2.7 04/19/2022 0854    CHOLHDL 3.7 CALC 12/25/2007 1636    VLDL 25 12/25/2007 1636    LDLCALC 98 04/19/2022 0854    LDLDIRECT 164.4 12/25/2007 1636        Physical Exam:     VS:  There were no vitals taken for this visit.        Wt Readings from Last 3 Encounters:  05/24/22 212 lb (96.2 kg)  04/25/22 204 lb (92.5 kg)  01/10/22 197 lb (89.4 kg)      Affect appropriate  Healthy:  appears stated age 20: normal Neck supple with no adenopathy JVP normal no bruits no thyromegaly Lungs clear with no wheezing and good diaphragmatic motion Heart:  S1/S2 SEM/AR  murmur, no rub, gallop or click PMI normal Abdomen: benighn, BS positve, no tenderness, no AAA no bruit.  No HSM or HJR Distal pulses intact with no bruits No edema Neuro non-focal Skin warm and dry No muscular weakness       PLAN:     In order of problems listed above:   1.S/p TAVR: 12/23/19 with 26 mm Sapien 3 valve gradients  elevated with worsening central AR ? Deep deployment and under expansion not apparent on TTE/CT will have structural team review. TEE scheduled for 06/1722 Orders done and endoscopy called    2. HTN:  On Toprol ARB added f/u home monitoring normal    3. Aneursym: CT  scan 06/23/22 4.4 cm ascending aortic root  History of bicuspid AV F/U CT June 2024 on beta blocker and ARB    4. CAD: Cath prior to TAVR 10/08/19 with 75% small D1 and 50% mid LAD. No chest pain. Continue medical therapy with ASA, statin and BB. CM note indicates D1 not suitable for PCI  Continue beta blocker, baby asa, statin Plavix d/c with history of ICH  LDL at goal    5. HLD:  now on crestor repeat labs in 2 months        Medication Adjustments/Labs and Tests Ordered:   TEE   Time : spent reviewing Cath, multiple echos CT chest direct patient interview , composing note 3and arranging TEE with orders 60 minutes   [name] MD [facility]

## 2022-07-03 NOTE — CV Procedure (Signed)
TEE: Anesthesia: Propofol  TAVR valve 26 mm ? Some under-expansion and thickened leaflets with  Moderate AS and moderate to severe AR No PVL Mild MR Mild TR Normal EF 60%  No effusion Moderate ascending aortic root dilatation 4.5 cm No LAA thrombus No ASD/PFO mobile atrial septum  See full report in [facility] Extensive 3D visualization of AV done   [name] MD [facility]

## 2022-07-03 NOTE — Transfer of Care (Signed)
Immediate Anesthesia Transfer of Care Note  Patient: Jose [name]  Procedure(s) Performed: TRANSESOPHAGEAL ECHOCARDIOGRAM (TEE)  Patient Location: PACU and Endoscopy Unit  Anesthesia Type:MAC  Level of Consciousness: awake and patient cooperative  Airway & Oxygen Therapy: Patient Spontanous Breathing and Patient connected to nasal cannula oxygen  Post-op Assessment: Report given to RN and Post -op Vital signs reviewed and stable  Post vital signs: Reviewed and stable  Last Vitals:  Vitals Value Taken Time  BP    Temp    Pulse 59 07/03/22 0952  Resp 19 07/03/22 0952  SpO2 94 % 07/03/22 0952  Vitals shown include unvalidated device data.  Last Pain:  Vitals:   07/03/22 0906  TempSrc: Temporal  PainSc: 0-No pain         Complications: No notable events documented.

## 2022-07-03 NOTE — Anesthesia Postprocedure Evaluation (Signed)
Anesthesia Post Note  Patient: Jose [name]  Procedure(s) Performed: TRANSESOPHAGEAL ECHOCARDIOGRAM (TEE)     Patient location during evaluation: PACU Anesthesia Type: MAC Level of consciousness: awake and alert Pain management: pain level controlled Vital Signs Assessment: post-procedure vital signs reviewed and stable Respiratory status: spontaneous breathing Cardiovascular status: stable Anesthetic complications: no   No notable events documented.  Last Vitals:  Vitals:   07/03/22 1000 07/03/22 1009  BP: 127/87 131/75  Pulse: (!) 53 (!) 55  Resp: 16 16  Temp:    SpO2: 95% 98%    Last Pain:  Vitals:   07/03/22 0954  TempSrc: Temporal  PainSc: 0-No pain                 Nolon Nations

## 2022-07-03 NOTE — Anesthesia Preprocedure Evaluation (Addendum)
Anesthesia Evaluation  Patient identified by MRN, date of birth, ID band Patient awake    Reviewed: Allergy & Precautions, NPO status , Patient's Chart, lab work & pertinent test results  History of Anesthesia Complications Negative for: history of anesthetic complications  Airway Mallampati: II  TM Distance: >3 FB Neck ROM: Full    Dental  (+) Dental Advisory Given, Teeth Intact   Pulmonary neg pulmonary ROS,    Pulmonary exam normal breath sounds clear to auscultation       Cardiovascular hypertension, Pt. on home beta blockers + CAD  + Valvular Problems/Murmurs AS and AI  Rhythm:Regular Rate:Normal + Systolic murmurs and + Diastolic murmurs Echo 12/7791 1. 26 mm S3. Moderate to severe aortic regurgitation is present. Appears central. V max 3.6 m/s, MG 28 mmHG, EOA 1.10 cm2, DI 0.24. Gradients are concerning in setting of AI, and appear to be worsening. Valve looks to be deployed low in the LVOT, question underexpansion. Would recommend TEE or cardiac CT for better characterization. The aortic valve has been repaired/replaced. Aortic valve regurgitation is moderate to severe. There is a 26 mm Sapien prosthetic (TAVR) valve present in the aortic position. Procedure Date: 12/23/2019.  2. Left ventricular ejection fraction, by estimation, is 50 to 55%. The left ventricle has low normal function. The left ventricle has no regional wall motion abnormalities. Left ventricular diastolic parameters are consistent with Grade I diastolic dysfunction (impaired relaxation).  3. Right ventricular systolic function is normal. The right ventricular size is normal.  4. The mitral valve is grossly normal. Mild mitral valve regurgitation. No evidence of mitral stenosis.  5. Aneurysm of the ascending aorta, measuring 45 mm.    Cardiac Cath Left Main The vessel exhibits minimal luminal irregularities. Left Anterior Descending Mid LAD lesion 60%  stenosed Mid LAD lesion is 60% stenosed. The lesion is eccentric. First Diagonal Branch 1st Diag-1 lesion 75% stenosed 1st Diag-1 lesion is 75% stenosed. 1st Diag-2 lesion 75% stenosed 1st Diag-2 lesion is 75% stenosed. Second Diagonal Branch 2nd Diag lesion 50% stenosed 2nd Diag lesion is 50% stenosed. Left Circumflex The vessel exhibits minimal luminal irregularities. Right Coronary Artery Prox RCA to Mid RCA lesion 35% stenosed Prox RCA to Mid RCA lesion is 35% stenosed. Intervention  No interventions have been documented.    Neuro/Psych  Headaches, PSYCHIATRIC DISORDERS vertebral artery aneurysm    GI/Hepatic Neg liver ROS, hiatal hernia, GERD  ,  Endo/Other  negative endocrine ROS  Renal/GU negative Renal ROS     Musculoskeletal  (+) Arthritis ,   Abdominal   Peds  Hematology  (+) Blood dyscrasia, anemia ,   Anesthesia Other Findings Day of surgery medications reviewed with the patient.  Reproductive/Obstetrics                            Anesthesia Physical  Anesthesia Plan  ASA: 4  Anesthesia Plan: MAC   Post-op Pain Management: Minimal or no pain anticipated   Induction: Intravenous  PONV Risk Score and Plan: 1 and Treatment may vary due to age or medical condition, Propofol infusion and TIVA  Airway Management Planned: Natural Airway and Simple Face Mask  Additional Equipment:   Intra-op Plan:   Post-operative Plan:   Informed Consent: I have reviewed the patients History and Physical, chart, labs and discussed the procedure including the risks, benefits and alternatives for the proposed anesthesia with the patient or authorized representative who has indicated his/her understanding and acceptance.  Dental advisory given  Plan Discussed with: CRNA  Anesthesia Plan Comments:        Anesthesia Quick Evaluation

## 2022-07-03 NOTE — Discharge Instructions (Signed)

## 2022-07-03 NOTE — Progress Notes (Signed)
  Echocardiogram Echocardiogram Transesophageal has been performed.  Jose [name] 07/03/2022, 9:58 AM

## 2022-07-03 NOTE — Anesthesia Procedure Notes (Signed)
Procedure Name: MAC Date/Time: 07/03/2022 9:23 AM  Performed by: Lowella Dell, CRNAPre-anesthesia Checklist: Patient identified, Emergency Drugs available, Suction available, Patient being monitored and Timeout performed Patient Re-evaluated:Patient Re-evaluated prior to induction Oxygen Delivery Method: Nasal cannula Placement Confirmation: positive ETCO2 Dental Injury: Teeth and Oropharynx as per pre-operative assessment

## 2022-07-04 ENCOUNTER — Telehealth: Payer: Self-pay

## 2022-07-04 ENCOUNTER — Telehealth: Payer: Self-pay | Admitting: Cardiology

## 2022-07-04 MED ORDER — APIXABAN 5 MG PO TABS: 5.0000 mg | ORAL_TABLET | Freq: Two times a day (BID) | ORAL | 11 refills | Status: DC

## 2022-07-04 NOTE — Telephone Encounter (Signed)
Called patient to inform him of medication addition. Will send message through mychart of information on medication. Will mail patient 30 day free trial card. Will send message to [employer] to help with any cost/insurance issues with eliquis.

## 2022-07-04 NOTE — Telephone Encounter (Signed)
-----   Message from Josue Hector, MD sent at 07/03/2022  1:21 PM EDT ----- Jose [name] can you call in eliquis 5 mg PO bid for HALT/HAM abnormal TAVR function

## 2022-07-04 NOTE — Telephone Encounter (Signed)
  [facility]   MULTIDISCIPLINARY HEART VALVE TEAM   Patients TEE with concern for HALT/HAM discussed today during heart valve meeting with plans to follow with repeat echocardiogram in 3 months with Dr. Johnsie Cancel office visit to follow. This has been scheduled for 09/27/22. He has already been started on Eliquis per Dr. Johnsie Cancel.   Kathyrn Drown NP-C Structural Heart Team  Pager: [phone] Phone: [phone]

## 2022-07-09 ENCOUNTER — Encounter: Payer: Self-pay | Admitting: Cardiovascular Disease

## 2022-07-10 ENCOUNTER — Encounter: Payer: Self-pay | Admitting: Rheumatology

## 2022-07-16 ENCOUNTER — Other Ambulatory Visit: Payer: Self-pay | Admitting: Rheumatology

## 2022-07-17 NOTE — Telephone Encounter (Signed)
Next Visit: 11/22/2022  Last Visit: 05/24/2022  Last Fill: 03/28/2022  DX:  Idiopathic chronic gout of multiple sites without tophus   Current Dose per office note 05/24/2022: allopurinol 300 mg 1 tablet by mouth daily  Labs: 05/24/2022  CBC and CMP are normal.  Uric Acid: 05/24/2022 Uric acid is low in the desirable range.   Okay to refill Allopurinol?

## 2022-09-08 ENCOUNTER — Encounter: Payer: Self-pay | Admitting: Cardiovascular Disease

## 2022-09-22 ENCOUNTER — Other Ambulatory Visit: Payer: Self-pay

## 2022-09-22 DIAGNOSIS — E46 Unspecified protein-calorie malnutrition: Secondary | ICD-10-CM

## 2022-09-22 DIAGNOSIS — E785 Hyperlipidemia, unspecified: Secondary | ICD-10-CM

## 2022-09-22 NOTE — Addendum Note (Signed)
Addended by: Betha Loa F on: 09/22/2022 03:23 PM   Modules accepted: Orders

## 2022-09-25 ENCOUNTER — Ambulatory Visit: Payer: Medicare Other | Admitting: Cardiovascular Disease

## 2022-09-25 ENCOUNTER — Other Ambulatory Visit: Payer: Self-pay | Admitting: Physician Assistant

## 2022-09-25 ENCOUNTER — Other Ambulatory Visit (HOSPITAL_COMMUNITY): Payer: Medicare Other

## 2022-09-25 NOTE — Progress Notes (Signed)
History of Present Illness:    71 y.o. with history of GERD, HTN,HLD, spontaneous CNS bleed with craniotomy 2016 when living in [place]. Bicuspid AV with severe AS and dilated aortic root 4.3 cm. Had TAVR with 26 mm Edwards Sapien valve 12/23/19.  Cath prior to procedure showed only obstructive CAD in diagonal  Rx medically Post op TTE with normal EF mean gradient 11 mmHg no PVL. Plavix now d/c due to history of ICH  TTE 12/22/20 had new mild central AR EF 60-65% mean gradient 14 peak 26 mmHg with DVI 0.30 EF 50-55%  TTE 12/28/21 EF 50-55% mean gradient 24 mmHg peak 38.4 mmHg DVI 0.28 mild PVL  TTE 05/24/22 new moderate to severe central AR mean gradient 28 peak 53 mmhg Ao 4.5 cm  CTA chest not cardiac stable 4.4 cm aneurysm The TAVR valve looks well positioned and not deep and leaflets look ok in single non gated phase with no HALT/HAM  LDL  86 03/07/22  on lipitor 20 mg He did not want to increase dose and NP then changed him to Crestor 40 mg daily   Diastolic pressures have been high Given low normal EF , aneurysm wanted patient to start on ARB he is already on a beta blocker   Has prostate cancer and having radioactive seeds placed in [place]    Reviewed his CTA and TTE with him He has developed higher gradients and central leak since implant CTA really shows relatively good position and not too deep to my eye Although CTA not gated leaflets looked normal with only minor thickening at base. Did have HALT/HAM  TEE done 07/03/22 after his trip to [place] showed moderate to severe central AR normal EF and no PVL  TTE today on eliquis with much improvement mean gradient down to 15 mmHg and AR only mild EF appears 45-50% He feels more tires ? Due to eliquis but doubt  10/31/22 Having coil embolization of brain aneurysm in [place]. This has been known since 2016 When he had his subdural Apparently has changed and needs Rx   Past Medical History:  Diagnosis Date   Arthritis    R shoulder, great  toes, hands- has gout & has injections in knees with Dr. Estanislado Pandy    Bicuspid aortic valve with ascending aorta 4.0 to 4.5 cm in diameter    CAD (coronary artery disease)    Cancer ([place])    skin ca-    GERD (gastroesophageal reflux disease)    Headache    History of hiatal hernia    Hypertension    Prostate cancer ([place])    S/P TAVR (transcatheter aortic valve replacement)    SDH (subdural hematoma) ([facility])    Severe aortic stenosis    Thoracic ascending aortic aneurysm ([facility])    Vertebral artery aneurysm ([place])     Past Surgical History:  Procedure Laterality Date   BRAIN SURGERY  2016   evacuation of SDH   CARDIAC CATHETERIZATION     CARPAL TUNNEL RELEASE Bilateral    GREEN LIGHT LASER TURP (TRANSURETHRAL RESECTION OF PROSTATE  04/27/2017   KNEE ARTHROPLASTY     LEFT HEART CATH AND CORONARY ANGIOGRAPHY N/A 10/08/2019   Procedure: LEFT HEART CATH AND CORONARY ANGIOGRAPHY;  Surgeon: Sherren Mocha, MD;  Location: [facility];  Service: Cardiovascular;  Laterality: N/A;   QUADRICEPS TENDON REPAIR Bilateral 06/15/2017   Procedure: REPAIR QUADRICEP TENDON;  Surgeon: Meredith Pel, MD;  Location: [facility];  Service: Orthopedics;  Laterality: Bilateral;   SHOULDER SURGERY Right    TEE WITHOUT CARDIOVERSION N/A 07/03/2022   Procedure: TRANSESOPHAGEAL ECHOCARDIOGRAM (TEE);  Surgeon: Josue Hector, MD;  Location[facility] END[facility];  Service: Cardiovascular;  Laterality: N/A;   TRANSCATHETER AORTIC VALVE REPLACEMENT, TRANSFEMORAL  12/23/2019   TRANSCATHETER AORTIC VALVE REPLACEMENT, TRANSFEMORAL N/A 12/23/2019   Procedure: TRANSCATHETER AORTIC VALVE REPLACEMENT, TRANSFEMORAL;  Surgeon: Sherren Mocha, MD;  Location: [facility];  Service: Open Heart Surgery;  Laterality: N/A;   VASCULAR SURGERY      Current Medications: No outpatient medications have been marked as taking for the 09/27/22 encounter (Appointment) with Josue Hector, MD.     Allergies:   Ciprofloxacin and Quinolones    Social History   Socioeconomic History   Marital status: Divorced    Spouse name: Not on file   Number of children: Not on file   Years of education: Not on file   Highest education level: Not on file  Occupational History   Not on file  Tobacco Use   Smoking status: Never   Smokeless tobacco: Never  Vaping Use   Vaping Use: Never used  Substance and Sexual Activity   Alcohol use: Yes    Alcohol/week: 8.0 standard drinks of alcohol    Types: 7 Glasses of wine, 1 Shots of liquor per week   Drug use: No   Sexual activity: Not on file  Other Topics Concern   Not on file  Social History Narrative   Not on file   Social Determinants of Health   Financial Resource Strain: Not on file  Food Insecurity: Not on file  Transportation Needs: Not on file  Physical Activity: Not on file  Stress: Not on file  Social Connections: Not on file     Family History: The patient's family history includes Diabetes in his brother; Heart disease in his father; Parkinson's disease in his brother and mother.  ROS:   Please see the history of present illness.    All other systems reviewed and are negative.  EKGs/Labs/Other Studies Reviewed:    The following studies were reviewed today: TAVR OPERATIVE NOTE     Date of Procedure:                12/23/2019   Preoperative Diagnosis:      Severe Aortic Stenosis    Postoperative Diagnosis:    Same    Procedure:        Transcatheter Aortic Valve Replacement - Percutaneous Transfemoral Approach             Edwards Sapien 3 UltraTHV (size 26 mm, model # [id number], serial # [id number])              Co-Surgeons:                        Gaye Pollack, MD and Sherren Mocha, MD   Anesthesiologist:                  Duane Boston, MD   Echocardiographer:              [name], MD   Pre-operative Echo Findings: Severe bicuspid aortic stenosis Normal left ventricular systolic function   Post-operative Echo Findings: No paravalvular  leak Normal left ventricular systolic function   ______________   12/24/19 ECHO IMPRESSIONS  1. Left ventricular ejection fraction, by visual estimation, is 60 to 65%. The left ventricle has normal function. There is mildly increased left  ventricular hypertrophy.  2. Left ventricular diastolic parameters are consistent with Grade I diastolic dysfunction (impaired relaxation).  3. The left ventricle has no regional wall motion abnormalities.  4. Global right ventricle has normal systolic function.The right ventricular size is normal. No increase in right ventricular wall thickness.  5. Left atrial size was normal.  6. Right atrial size was normal.  7. The mitral valve is normal in structure. No evidence of mitral valve regurgitation. No evidence of mitral stenosis.  8. The tricuspid valve is normal in structure.  9. Aortic valve regurgitation is not visualized. No evidence of aortic valve sclerosis or stenosis. 10. The pulmonic valve was normal in structure. Pulmonic valve regurgitation is not visualized. 11. There is moderate dilatation of the ascending aorta measuring 45 mm. 12. The inferior vena cava is normal in size with greater than 50% respiratory variability, suggesting right atrial pressure of 3 mmHg. 13. S/P TAVR with a 26 mm Edwards-SAPIEN 3 Ultra valve, peak/mean gradients 17/11 mmHg. No paravalvular leak.   Aortic Valve: The aortic valve has been repaired/replaced. Aortic valve regurgitation is not visualized. The aortic valve is structurally normal, with no evidence of sclerosis or stenosis. Aortic valve mean gradient measures 10.5 mmHg. Aortic valve peak  gradient measures 17.0 mmHg. Aortic valve area, by VTI measures 3.38 cm. 26 Edwards Sapien bioprosthetic, stented aortic valve (TAVR) valve is present in the aortic position.   __________________   Echo 01/15/20 IMPRESSIONS   1. Left ventricular ejection fraction, by visual estimation, is 55 to 60%. The left ventricle has  normal function. There is mildly increased left ventricular hypertrophy.   2. Left ventricular diastolic parameters are consistent with Grade I diastolic dysfunction (impaired relaxation).   3. The left ventricle has no regional wall motion abnormalities.   4. Global right ventricle has normal systolic function.The right ventricular size is normal. No increase in right ventricular wall thickness.   5. Left atrial size was mild-moderately dilated.   6. Right atrial size was normal.   7. The mitral valve is normal in structure. Trivial mitral valve regurgitation.   8. The tricuspid valve is normal in structure.   9. Aortic stent-valve (TAVR) prosthesis is well seated, without perivalvular leak.  10. Aortic valve mean gradient measures 13.7 mmHg.  11. Aortic valve peak gradient measures 24.4 mmHg.  12. Aortic valve regurgitation is not visualized.  13. The pulmonic valve was not well visualized. Pulmonic valve regurgitation is not visualized.  14. Aortic dilatation noted.  15. There is mild to moderate dilatation of the ascending aorta measuring  44 mm.  16. No significant change from prior study.  17. Prior images reviewed side by side.  18. When compared to the 12/24/2019 study, the TAVR gradients are very slightly higher. The changes in TAVR dimensionless index and calculated valve area are due to differences in LVOT pulsed wave sampling technique, rather than a true change in valve hemodynamics.   _______________  CT ANGIOGRAPHY CHEST WITH CONTRAST  06/03/21   MPRESSION: Unchanged size and appearance of ascending aorta, estimated 4.4 cm maximum dimension on the current CT. Recommend annual imaging followup by CTA or MRA. This recommendation follows 2010 ACCF/AHA/AATS/ACR/ASA/SCA/SCAI/SIR/STS/SVM Guidelines for the Diagnosis and Management of Patients with Thoracic Aortic Disease. Circulation. 2010; 121: N867-E720. Aortic aneurysm NOS (ICD10-I71.9)   Redemonstration of changes of prior  TAVR   Aortic Atherosclerosis (ICD10-I70.0).   Signed,   Dulcy Fanny. Wagner, DO, RPVI __________________  Echo 12/28/21 IMPRESSIONS   1. Left ventricular ejection  fraction, by estimation, is 50 to 55%. The  left ventricle has low normal function. The left ventricle demonstrates  global hypokinesis. There is mild left ventricular hypertrophy. Left  ventricular diastolic parameters are  consistent with Grade I diastolic dysfunction (impaired relaxation).   2. Right ventricular systolic function is mildly reduced. The right  ventricular size is normal. Tricuspid regurgitation signal is inadequate  for assessing PA pressure.   3. Left atrial size was mildly dilated.   4. The mitral valve is normal in structure. Trivial mitral valve  regurgitation. No evidence of mitral stenosis.   5. Bioprosthetic aortic valve s/p TAVR, 26 mm Edwards Sapien THV. Mean  gradient 24 mmHg which is elevated (22 mmHg on previous study).  Dimensionless index 0.3. Mild perivalvular leakage.   6. Aortic dilatation noted. There is moderate dilatation of the ascending  aorta, measuring 44 mm.   7. IVC not visualized.   Echo 12/22/20 IMPRESSIONS  1. Left ventricular ejection fraction, by estimation, is 60 to 65%. The  left ventricle has normal function. The left ventricle has no regional  wall motion abnormalities. There is mild left ventricular hypertrophy.  Left ventricular diastolic parameters  are consistent with Grade I diastolic dysfunction (impaired relaxation).   2. Right ventricular systolic function is normal. The right ventricular  size is mildly enlarged.   3. The mitral valve is normal in structure. Trivial mitral valve  regurgitation. No evidence of mitral stenosis.   4. Mild TAVR valve regurgitation. The aortic valve has been  repaired/replaced. Aortic valve regurgitation is mild. No aortic stenosis  is present. There is a 26 mm Sapien prosthetic (TAVR) valve present in the  aortic position.  Procedure Date: 12/23/19.  Aortic regurgitation PHT measures 833 msec. Aortic valve mean gradient  measures 14.0 mmHg. Aortic valve Vmax measures 2.56 m/s.   5. Aortic dilatation noted. There is moderate dilatation of the ascending  aorta, measuring 46 mm.   6. The inferior vena cava is normal in size with greater than 50%  respiratory variability, suggesting right atrial pressure of 3 mmHg.   Comparison(s): When compared to prior, there is a mild aortic  regurgitation signal.   TEE 07/03/2022  IMPRESSIONS     1. Left ventricular ejection fraction, by estimation, is 60 to 65%. The  left ventricle has normal function. The left ventricle has no regional  wall motion abnormalities.   2. Right ventricular systolic function is normal. The right ventricular  size is normal.   3. No left atrial/left atrial appendage thrombus was detected.   4. The mitral valve is normal in structure. No evidence of mitral valve  regurgitation. No evidence of mitral stenosis.   5. Post TAVR with 26 mm Sapien 3 turbulence and elevated systolic  gradient Moderate to severe central AR No PVL Valve may be under expanded  with V shape from annulus to root. Patient has some HALT/HAM as well on  CTA and leaflets appear thickened with  some systolic doming and restriction to motion . The aortic valve has been  repaired/replaced. Aortic valve regurgitation is not visualized. No aortic  stenosis is present.   6. The inferior vena cava is normal in size with greater than 50%  respiratory variability, suggesting right atrial pressure of 3 mmHg.   Conclusion(s)/Recommendation(s): Normal biventricular function without  evidence of hemodynamically significant valvular heart disease.   EKG:     SR rate 64 LAD 01/01/20   Recent Labs: 05/16/2022: BUN 28; Creatinine, Ser 1.09; Potassium 4.1;  Sodium 143 05/24/2022: Hemoglobin 14.7; Platelets 169 05/25/2022: ALT 28  Recent Lipid Panel    Component Value Date/Time   CHOL 142  05/25/2022 0918   TRIG 133 05/25/2022 0918   HDL 67 05/25/2022 0918   CHOLHDL 2.1 05/25/2022 0918   CHOLHDL 3.7 CALC 12/25/2007 1636   VLDL 25 12/25/2007 1636   LDLCALC 52 05/25/2022 0918   LDLDIRECT 164.4 12/25/2007 1636    Physical Exam:    VS:  There were no vitals taken for this visit.    Wt Readings from Last 3 Encounters:  07/03/22 201 lb (91.2 kg)  05/30/22 211 lb (95.7 kg)  05/24/22 212 lb (96.2 kg)     Affect appropriate Healthy:  appears stated age 66: normal Neck supple with no adenopathy JVP normal no bruits no thyromegaly Lungs clear with no wheezing and good diaphragmatic motion Heart:  S1/S2 SEM/AR  murmur, no rub, gallop or click PMI normal Abdomen: benighn, BS positve, no tenderness, no AAA no bruit.  No HSM or HJR Distal pulses intact with no bruits No edema Neuro non-focal Skin warm and dry No muscular weakness    PLAN:    In order of problems listed above:  1.S/p TAVR: 12/23/19 with 26 mm Sapien 3 valve gradients  elevated with worsening central AR ? Deep deployment and under expansion not apparent on TTE/CT  HALT/HAM on CT and TEE 07/03/22 with moderate to severe central AR no PVL  Eliquis started 07/04/22 echo 09/27/2022 and gradients much better with mild AR.    2. HTN:  On Toprol ARB added f/u home monitoring normal   3. Aneursym: CT scan 06/23/22 4.4 cm ascending aortic root  History of bicuspid AV F/U CT June 2024 on beta blocker and ARB   4. CAD: Cath prior to TAVR 10/08/19 with 75% small D1 and 50% mid LAD. No chest pain. Continue medical therapy with ASA, statin and BB. CM note indicates D1 not suitable for PCI  Continue beta blocker, baby asa, statin Plavix d/c with history of ICH  LDL at goal   5. HLD:  now on crestor repeat labs in 2 months   6. Brain Aneurysm:  Having coil embolizaiton in [place] 10/31/22 Hold eliquis 5 days before Will be on plavix post op Suspect he will need to hold eliquis for 2 weeks before     Medication  Adjustments/Labs and Tests Ordered:  F/U December post neurosurgery   Signed, [name], MD  09/25/2022 11:13 AM    [place]

## 2022-09-26 ENCOUNTER — Ambulatory Visit: Payer: Medicare Other | Attending: Cardiovascular Disease

## 2022-09-26 ENCOUNTER — Telehealth: Payer: Self-pay

## 2022-09-26 ENCOUNTER — Other Ambulatory Visit: Payer: Self-pay

## 2022-09-26 DIAGNOSIS — E785 Hyperlipidemia, unspecified: Secondary | ICD-10-CM

## 2022-09-26 DIAGNOSIS — Z79899 Other long term (current) drug therapy: Secondary | ICD-10-CM

## 2022-09-26 DIAGNOSIS — Z952 Presence of prosthetic heart valve: Secondary | ICD-10-CM

## 2022-09-26 DIAGNOSIS — I359 Nonrheumatic aortic valve disorder, unspecified: Secondary | ICD-10-CM

## 2022-09-26 NOTE — Telephone Encounter (Signed)
Lipid panel reordered so we could draw it at our office today.

## 2022-09-26 NOTE — Progress Notes (Signed)
Patient usually gets lab work done out of town, but he is coming to the office today to get lab work. Place new order.

## 2022-09-26 NOTE — Telephone Encounter (Signed)
Next Visit: 11/22/2022  Last Visit: 6/77/2023  Last Fill: 07/17/2022  DX:  Idiopathic chronic gout of multiple sites without tophus   Current Dose per office note 05/24/2022: allopurinol 300 mg 1 tablet by mouth daily  Labs: 05/24/2022 Uric acid is low in the desirable range.  CBC and CMP are normal  Okay to refill Allopurinol?

## 2022-09-27 ENCOUNTER — Other Ambulatory Visit (HOSPITAL_COMMUNITY): Payer: Self-pay | Admitting: Cardiovascular Disease

## 2022-09-27 ENCOUNTER — Encounter: Payer: Self-pay | Admitting: Cardiovascular Disease

## 2022-09-27 ENCOUNTER — Ambulatory Visit (HOSPITAL_COMMUNITY): Payer: Medicare Other | Attending: Internal Medicine

## 2022-09-27 ENCOUNTER — Ambulatory Visit (INDEPENDENT_AMBULATORY_CARE_PROVIDER_SITE_OTHER): Payer: Medicare Other | Admitting: Cardiovascular Disease

## 2022-09-27 VITALS — BP 126/90 | HR 54 | Ht 72.0 in | Wt 210.0 lb

## 2022-09-27 DIAGNOSIS — Z952 Presence of prosthetic heart valve: Secondary | ICD-10-CM | POA: Insufficient documentation

## 2022-09-27 DIAGNOSIS — I671 Cerebral aneurysm, nonruptured: Secondary | ICD-10-CM | POA: Insufficient documentation

## 2022-09-27 DIAGNOSIS — I2510 Atherosclerotic heart disease of native coronary artery without angina pectoris: Secondary | ICD-10-CM | POA: Diagnosis not present

## 2022-09-27 DIAGNOSIS — I361 Nonrheumatic tricuspid (valve) insufficiency: Secondary | ICD-10-CM | POA: Diagnosis not present

## 2022-09-27 DIAGNOSIS — E785 Hyperlipidemia, unspecified: Secondary | ICD-10-CM | POA: Diagnosis present

## 2022-09-27 DIAGNOSIS — Z953 Presence of xenogenic heart valve: Secondary | ICD-10-CM | POA: Insufficient documentation

## 2022-09-27 DIAGNOSIS — E782 Mixed hyperlipidemia: Secondary | ICD-10-CM | POA: Diagnosis present

## 2022-09-27 DIAGNOSIS — I350 Nonrheumatic aortic (valve) stenosis: Secondary | ICD-10-CM | POA: Insufficient documentation

## 2022-09-27 DIAGNOSIS — I10 Essential (primary) hypertension: Secondary | ICD-10-CM | POA: Diagnosis present

## 2022-09-27 LAB — BASIC METABOLIC PANEL
BUN/Creatinine Ratio: 17 (ref 10–24)
BUN: 19 mg/dL (ref 8–27)
CO2: 25 mmol/L (ref 20–29)
Calcium: 9.3 mg/dL (ref 8.6–10.2)
Chloride: 106 mmol/L (ref 96–106)
Creatinine, Ser: 1.1 mg/dL (ref 0.76–1.27)
Glucose: 106 mg/dL — ABNORMAL HIGH (ref 70–99)
Potassium: 4.2 mmol/L (ref 3.5–5.2)
Sodium: 143 mmol/L (ref 134–144)
eGFR: 71 mL/min/{1.73_m2} (ref >59)

## 2022-09-27 LAB — CBC WITH DIFFERENTIAL/PLATELET
Basophils Absolute: 0.1 10*3/uL (ref 0.0–0.2)
Basos: 1 %
EOS (ABSOLUTE): 0.1 10*3/uL (ref 0.0–0.4)
Eos: 2 %
Hematocrit: 39.5 % (ref 37.5–51.0)
Hemoglobin: 14.3 g/dL (ref 13.0–17.7)
Immature Grans (Abs): 0 10*3/uL (ref 0.0–0.1)
Immature Granulocytes: 0 %
Lymphocytes Absolute: 1.4 10*3/uL (ref 0.7–3.1)
Lymphs: 23 %
MCH: 34 pg — ABNORMAL HIGH (ref 26.6–33.0)
MCHC: 36.2 g/dL — ABNORMAL HIGH (ref 31.5–35.7)
MCV: 94 fL (ref 79–97)
Monocytes Absolute: 0.5 10*3/uL (ref 0.1–0.9)
Monocytes: 8 %
Neutrophils Absolute: 4.1 10*3/uL (ref 1.4–7.0)
Neutrophils: 66 %
Platelets: 143 10*3/uL — ABNORMAL LOW (ref 150–450)
RBC: 4.21 x10E6/uL (ref 4.14–5.80)
RDW: 13.3 % (ref 11.6–15.4)
WBC: 6.3 10*3/uL (ref 3.4–10.8)

## 2022-09-27 LAB — ECHOCARDIOGRAM COMPLETE
AV Mean grad: 19.2 mmHg
AV Peak grad: 28.4 mmHg
Ao pk vel: 2.66 m/s
Area-P 1/2: 2.48 cm2
S' Lateral: 3.8 cm

## 2022-09-27 LAB — LIPID PANEL
Chol/HDL Ratio: 2.6 ratio (ref 0.0–5.0)
Cholesterol, Total: 161 mg/dL (ref 100–199)
HDL: 61 mg/dL (ref >39)
LDL Chol Calc (NIH): 68 mg/dL (ref 0–99)
Triglycerides: 194 mg/dL — ABNORMAL HIGH (ref 0–149)
VLDL Cholesterol Cal: 32 mg/dL (ref 5–40)

## 2022-09-27 NOTE — Patient Instructions (Signed)
Medication Instructions:  Your physician recommends that you continue on your current medications as directed. Please refer to the Current Medication list given to you today.  *If you need a refill on your cardiac medications before your next appointment, please call your pharmacy*  Lab Work: If you have labs (blood work) drawn today and your tests are completely normal, you will receive your results only by: [place] (if you have MyChart) OR A paper copy in the mail If you have any lab test that is abnormal or we need to change your treatment, we will call you to review the results.  Testing/Procedures: None ordered today.  Follow-Up: At [facility], you and your health needs are our priority.  As part of our continuing mission to provide you with exceptional heart care, we have created designated Provider Care Teams.  These Care Teams include your primary Cardiologist (physician) and Advanced Practice Providers (APPs -  Physician Assistants and Nurse Practitioners) who all work together to provide you with the care you need, when you need it.  We recommend signing up for the patient portal called "MyChart".  Sign up information is provided on this After Visit Summary.  MyChart is used to connect with patients for Virtual Visits (Telemedicine).  Patients are able to view lab/test results, encounter notes, upcoming appointments, etc.  Non-urgent messages can be sent to your provider as well.   To learn more about what you can do with MyChart, go to [email or web address].    Your next appointment:   2 month(s)  The format for your next appointment:   In Person  Provider:   [name], MD     Important Information About Sugar

## 2022-09-28 ENCOUNTER — Encounter: Payer: Self-pay | Admitting: Rheumatology

## 2022-09-29 NOTE — Telephone Encounter (Signed)
Please schedule an in office visit with cortisone injection at the same visit.  He can get labs locally prior to the visit.

## 2022-10-25 ENCOUNTER — Encounter: Payer: Self-pay | Admitting: Cardiovascular Disease

## 2022-10-31 HISTORY — PX: OTHER SURGICAL HISTORY: SHX169

## 2022-11-02 ENCOUNTER — Encounter: Payer: Self-pay | Admitting: Cardiovascular Disease

## 2022-11-08 NOTE — Progress Notes (Deleted)
Office Visit Note  Patient: Jose [name]             Date of Birth: 31-Jan-1951           MRN: [id number]             PCP: [name], MD Referring: [name], MD Visit Date: 11/22/2022 Occupation: '@GUAROCC'$ @  Subjective:  No chief complaint on file.   History of Present Illness: Jose [name] is a 71 y.o. male ***   Activities of Daily Living:  Patient reports morning stiffness for *** {minute/hour:19697}.   Patient {ACTIONS;DENIES/REPORTS:21021675::"Denies"} nocturnal pain.  Difficulty dressing/grooming: {ACTIONS;DENIES/REPORTS:21021675::"Denies"} Difficulty climbing stairs: {ACTIONS;DENIES/REPORTS:21021675::"Denies"} Difficulty getting out of chair: {ACTIONS;DENIES/REPORTS:21021675::"Denies"} Difficulty using hands for taps, buttons, cutlery, and/or writing: {ACTIONS;DENIES/REPORTS:21021675::"Denies"}  No Rheumatology ROS completed.   PMFS History:  Patient Active Problem List   Diagnosis Date Noted   Hypertension    Severe aortic stenosis 10/08/2019   Candidiasis    Abnormality of gait    Hypoalbuminemia due to protein-calorie malnutrition [facility])    History of gout    Sleep disturbance    Constipation    Rupture of quadriceps tendon, right, sequela 06/19/2017   Quadriceps tendon rupture, left, sequela 06/19/2017   Acute blood loss anemia 06/19/2017   Fall    History of subdural hematoma    Post-operative pain    Recurrent falls    Quadriceps tendon rupture 06/15/2017   Primary osteoarthritis of both knees 02/28/2017   Primary osteoarthritis of both hands 02/28/2017   Uricacidemia 02/28/2017   Pain in joint of left knee 02/07/2017   Idiopathic chronic gout, unspecified site, without tophus (tophi) 11/29/2016   HEMATURIA UNSPECIFIED 01/25/2009   ABDOMINAL PAIN 01/25/2009   XERODERMA 03/10/2008   UNS ADVRS EFF UNS RX MEDICINAL&BIOLOGICAL SBSTNC 03/10/2008   ADJUSTMENT REACTION WITH PHYSICAL SYMPTOMS 02/14/2008   MUSCLE SPASM 02/14/2008   ACUTE  PROSTATITIS 12/16/2007   BREAST LUMP OR MASS, RIGHT 07/12/2007   H/O: RCT (rotator cuff tear) 01/13/2007   GOUT 01/08/2007    Past Medical History:  Diagnosis Date   Arthritis    R shoulder, great toes, hands- has gout & has injections in knees with Dr. Estanislado Pandy    Bicuspid aortic valve with ascending aorta 4.0 to 4.5 cm in diameter    CAD (coronary artery disease)    Cancer (HCC)    skin ca-    GERD (gastroesophageal reflux disease)    Headache    History of hiatal hernia    Hypertension    Prostate cancer ([place])    S/P TAVR (transcatheter aortic valve replacement)    SDH (subdural hematoma) (HCC)    Severe aortic stenosis    Thoracic ascending aortic aneurysm (HCC)    Vertebral artery aneurysm ([place])     Family History  Problem Relation Age of Onset   Parkinson's disease Mother    Heart disease Father    Parkinson's disease Brother    Diabetes Brother    Past Surgical History:  Procedure Laterality Date   BRAIN SURGERY  2016   evacuation of SDH   CARDIAC CATHETERIZATION     CARPAL TUNNEL RELEASE Bilateral    GREEN LIGHT LASER TURP (TRANSURETHRAL RESECTION OF PROSTATE  04/27/2017   KNEE ARTHROPLASTY     LEFT HEART CATH AND CORONARY ANGIOGRAPHY N/A 10/08/2019   Procedure: LEFT HEART CATH AND CORONARY ANGIOGRAPHY;  Surgeon: Sherren Mocha, MD;  Location: [place] [facility];  Service: Cardiovascular;  Laterality: N/A;   QUADRICEPS TENDON REPAIR  Bilateral 06/15/2017   Procedure: REPAIR QUADRICEP TENDON;  Surgeon: Meredith Pel, MD;  Location: [place];  Service: Orthopedics;  Laterality: Bilateral;   SHOULDER SURGERY Right    TEE WITHOUT CARDIOVERSION N/A 07/03/2022   Procedure: TRANSESOPHAGEAL ECHOCARDIOGRAM (TEE);  Surgeon: Josue Hector, MD;  Location: [facility] ENDOSCOPY;  Service: Cardiovascular;  Laterality: N/A;   TRANSCATHETER AORTIC VALVE REPLACEMENT, TRANSFEMORAL  12/23/2019   TRANSCATHETER AORTIC VALVE REPLACEMENT, TRANSFEMORAL N/A 12/23/2019   Procedure:  TRANSCATHETER AORTIC VALVE REPLACEMENT, TRANSFEMORAL;  Surgeon: Sherren Mocha, MD;  Location: [place];  Service: Open Heart Surgery;  Laterality: N/A;   VASCULAR SURGERY     Social History   Social History Narrative   Not on file   Immunization History  Administered Date(s) Administered   PFIZER(Purple Top)SARS-COV-2 Vaccination 02/19/2020, 03/17/2020, 10/25/2020, 08/30/2021     Objective: Vital Signs: There were no vitals taken for this visit.   Physical Exam   Musculoskeletal Exam: ***  CDAI Exam: CDAI Score: -- Patient Global: --; Provider Global: -- Swollen: --; Tender: -- Joint Exam 11/22/2022   No joint exam has been documented for this visit   There is currently no information documented on the homunculus. Go to the Rheumatology activity and complete the homunculus joint exam.  Investigation: No additional findings.  Imaging: No results found.  Recent Labs: Lab Results  Component Value Date   WBC 6.3 09/26/2022   HGB 14.3 09/26/2022   PLT 143 (L) 09/26/2022   NA 143 09/26/2022   K 4.2 09/26/2022   CL 106 09/26/2022   CO2 25 09/26/2022   GLUCOSE 106 (H) 09/26/2022   BUN 19 09/26/2022   CREATININE 1.10 09/26/2022   BILITOT 0.4 05/25/2022   ALKPHOS 68 05/25/2022   AST 31 05/25/2022   ALT 28 05/25/2022   PROT 6.2 05/25/2022   ALBUMIN 4.3 05/25/2022   CALCIUM 9.3 09/26/2022   GFRAA 70 05/05/2021    Speciality Comments: No specialty comments available.  Procedures:  No procedures performed Allergies: Ciprofloxacin and Quinolones   Assessment / Plan:     Visit Diagnoses: No diagnosis found.  Orders: No orders of the defined types were placed in this encounter.  No orders of the defined types were placed in this encounter.   Face-to-face time spent with patient was *** minutes. Greater than 50% of time was spent in counseling and coordination of care.  Follow-Up Instructions: No follow-ups on file.   Earnestine Mealing, CMA  Note - This  record has been created using Editor, commissioning.  Chart creation errors have been sought, but may not always  have been located. Such creation errors do not reflect on  the standard of medical care.

## 2022-11-21 NOTE — Progress Notes (Signed)
History of Present Illness:    71 y.o. with history of GERD, HTN,HLD, spontaneous CNS bleed with craniotomy 2016 when living in [place]. Bicuspid AV with severe AS and dilated aortic root 4.3 cm. Had TAVR with 26 mm Edwards Sapien valve 12/23/19.  Cath prior to procedure showed only obstructive CAD in diagonal  Rx medically Post op TTE with normal EF mean gradient 11 mmHg no PVL. Plavix now d/c due to history of ICH  TTE 12/22/20 had new mild central AR EF 60-65% mean gradient 14 peak 26 mmHg with DVI 0.30 EF 50-55%  TTE 12/28/21 EF 50-55% mean gradient 24 mmHg peak 38.4 mmHg DVI 0.28 mild PVL  TTE 05/24/22 new moderate to severe central AR mean gradient 28 peak 53 mmhg Ao 4.5 cm  CTA chest not cardiac stable 4.4 cm aneurysm The TAVR valve looks well positioned and not deep and leaflets look ok in single non gated phase with no HALT/HAM  LDL  86 03/07/22  on lipitor 20 mg He did not want to increase dose and NP then changed him to Crestor 40 mg daily   Diastolic pressures have been high Given low normal EF , aneurysm wanted patient to start on ARB he is already on a beta blocker   Has prostate cancer and having radioactive seeds placed in [place]    Reviewed his CTA and TTE with him He has developed higher gradients and central leak since implant CTA really shows relatively good position and not too deep to my eye Although CTA not gated leaflets looked normal with only minor thickening at base. Did have HALT/HAM  TEE done 07/03/22 after his trip to [place] showed moderate to severe central AR normal EF and no PVL  TTE today on eliquis with much improvement mean gradient down to 15 mmHg and AR only mild EF appears 45-50% He feels more tires ? Due to eliquis but doubt  10/31/22 Had coil embolization of brain aneurysm in [place].Dr Ilona Sorrel   Known since 2016 When he had his subdural  Seen in [facility] 11/05/22 for right groin hematoma by CT told to hold eliquis another 48 hours Hct stable 39.8 As far  as I can tell he was told to take ASA/Plavix and restart eliquis in 5 days. Started 11/18 and noted more swelling/bruising in leg next day [place] negative and leg healed fine   Plan is to d/c Plavix after 6 months     Past Medical History:  Diagnosis Date   Arthritis    R shoulder, great toes, hands- has gout & has injections in knees with Dr. Estanislado Pandy    Bicuspid aortic valve with ascending aorta 4.0 to 4.5 cm in diameter    CAD (coronary artery disease)    Cancer (HCC)    skin ca-    GERD (gastroesophageal reflux disease)    Headache    History of hiatal hernia    Hypertension    Prostate cancer ([place])    S/P TAVR (transcatheter aortic valve replacement)    SDH (subdural hematoma) (HCC)    Severe aortic stenosis    Thoracic ascending aortic aneurysm (HCC)    Vertebral artery aneurysm [facility])     Past Surgical History:  Procedure Laterality Date   BRAIN SURGERY  2016   evacuation of SDH   CARDIAC CATHETERIZATION     CARPAL TUNNEL RELEASE Bilateral    GREEN LIGHT LASER TURP (TRANSURETHRAL RESECTION OF PROSTATE  04/27/2017   KNEE ARTHROPLASTY  LEFT HEART CATH AND CORONARY ANGIOGRAPHY N/A 10/08/2019   Procedure: LEFT HEART CATH AND CORONARY ANGIOGRAPHY;  Surgeon: Sherren Mocha, MD;  Location: [place] [facility];  Service: Cardiovascular;  Laterality: N/A;   QUADRICEPS TENDON REPAIR Bilateral 06/15/2017   Procedure: REPAIR QUADRICEP TENDON;  Surgeon: Meredith Pel, MD;  Location: [place];  Service: Orthopedics;  Laterality: Bilateral;   SHOULDER SURGERY Right    TEE WITHOUT CARDIOVERSION N/A 07/03/2022   Procedure: TRANSESOPHAGEAL ECHOCARDIOGRAM (TEE);  Surgeon: Josue Hector, MD;  Location: [facility] ENDOSCOPY;  Service: Cardiovascular;  Laterality: N/A;   TRANSCATHETER AORTIC VALVE REPLACEMENT, TRANSFEMORAL  12/23/2019   TRANSCATHETER AORTIC VALVE REPLACEMENT, TRANSFEMORAL N/A 12/23/2019   Procedure: TRANSCATHETER AORTIC VALVE REPLACEMENT, TRANSFEMORAL;  Surgeon: Sherren Mocha,  MD;  Location: [name];  Service: Open Heart Surgery;  Laterality: N/A;   VASCULAR SURGERY      Current Medications: No outpatient medications have been marked as taking for the 11/30/22 encounter (Appointment) with Josue Hector, MD.     Allergies:   Ciprofloxacin and Quinolones   Social History   Socioeconomic History   Marital status: Divorced    Spouse name: Not on file   Number of children: Not on file   Years of education: Not on file   Highest education level: Not on file  Occupational History   Not on file  Tobacco Use   Smoking status: Never   Smokeless tobacco: Never  Vaping Use   Vaping Use: Never used  Substance and Sexual Activity   Alcohol use: Yes    Alcohol/week: 8.0 standard drinks of alcohol    Types: 7 Glasses of wine, 1 Shots of liquor per week   Drug use: No   Sexual activity: Not on file  Other Topics Concern   Not on file  Social History Narrative   Not on file   Social Determinants of Health   Financial Resource Strain: Not on file  Food Insecurity: Not on file  Transportation Needs: Not on file  Physical Activity: Not on file  Stress: Not on file  Social Connections: Not on file     Family History: The patient's family history includes Diabetes in his brother; Heart disease in his father; Parkinson's disease in his brother and mother.  ROS:   Please see the history of present illness.    All other systems reviewed and are negative.  EKGs/Labs/Other Studies Reviewed:    The following studies were reviewed today: TAVR OPERATIVE NOTE     Date of Procedure:                12/23/2019   Preoperative Diagnosis:      Severe Aortic Stenosis    Postoperative Diagnosis:    Same    Procedure:        Transcatheter Aortic Valve Replacement - Percutaneous Transfemoral Approach             Edwards Sapien 3 UltraTHV (size 26 mm, model # 9750TFX, serial # [id number])              Co-Surgeons:                        Gaye Pollack, MD and  Sherren Mocha, MD   Anesthesiologist:                  Duane Boston, MD   Echocardiographer:              Collier Salina  Johnsie Cancel, MD   Pre-operative Echo Findings: Severe bicuspid aortic stenosis Normal left ventricular systolic function   Post-operative Echo Findings: No paravalvular leak Normal left ventricular systolic function   ______________   12/24/19 ECHO IMPRESSIONS  1. Left ventricular ejection fraction, by visual estimation, is 60 to 65%. The left ventricle has normal function. There is mildly increased left ventricular hypertrophy.  2. Left ventricular diastolic parameters are consistent with Grade I diastolic dysfunction (impaired relaxation).  3. The left ventricle has no regional wall motion abnormalities.  4. Global right ventricle has normal systolic function.The right ventricular size is normal. No increase in right ventricular wall thickness.  5. Left atrial size was normal.  6. Right atrial size was normal.  7. The mitral valve is normal in structure. No evidence of mitral valve regurgitation. No evidence of mitral stenosis.  8. The tricuspid valve is normal in structure.  9. Aortic valve regurgitation is not visualized. No evidence of aortic valve sclerosis or stenosis. 10. The pulmonic valve was normal in structure. Pulmonic valve regurgitation is not visualized. 11. There is moderate dilatation of the ascending aorta measuring 45 mm. 12. The inferior vena cava is normal in size with greater than 50% respiratory variability, suggesting right atrial pressure of 3 mmHg. 13. S/P TAVR with a 26 mm Edwards-SAPIEN 3 Ultra valve, peak/mean gradients 17/11 mmHg. No paravalvular leak.   Aortic Valve: The aortic valve has been repaired/replaced. Aortic valve regurgitation is not visualized. The aortic valve is structurally normal, with no evidence of sclerosis or stenosis. Aortic valve mean gradient measures 10.5 mmHg. Aortic valve peak  gradient measures 17.0 mmHg. Aortic valve  area, by VTI measures 3.38 cm. 26 Edwards Sapien bioprosthetic, stented aortic valve (TAVR) valve is present in the aortic position.   __________________   Echo 01/15/20 IMPRESSIONS   1. Left ventricular ejection fraction, by visual estimation, is 55 to 60%. The left ventricle has normal function. There is mildly increased left ventricular hypertrophy.   2. Left ventricular diastolic parameters are consistent with Grade I diastolic dysfunction (impaired relaxation).   3. The left ventricle has no regional wall motion abnormalities.   4. Global right ventricle has normal systolic function.The right ventricular size is normal. No increase in right ventricular wall thickness.   5. Left atrial size was mild-moderately dilated.   6. Right atrial size was normal.   7. The mitral valve is normal in structure. Trivial mitral valve regurgitation.   8. The tricuspid valve is normal in structure.   9. Aortic stent-valve (TAVR) prosthesis is well seated, without perivalvular leak.  10. Aortic valve mean gradient measures 13.7 mmHg.  11. Aortic valve peak gradient measures 24.4 mmHg.  12. Aortic valve regurgitation is not visualized.  13. The pulmonic valve was not well visualized. Pulmonic valve regurgitation is not visualized.  14. Aortic dilatation noted.  15. There is mild to moderate dilatation of the ascending aorta measuring  44 mm.  16. No significant change from prior study.  17. Prior images reviewed side by side.  18. When compared to the 12/24/2019 study, the TAVR gradients are very slightly higher. The changes in TAVR dimensionless index and calculated valve area are due to differences in LVOT pulsed wave sampling technique, rather than a true change in valve hemodynamics.   _______________  CT ANGIOGRAPHY CHEST WITH CONTRAST  06/03/21   MPRESSION: Unchanged size and appearance of ascending aorta, estimated 4.4 cm maximum dimension on the current CT. Recommend annual imaging followup  by CTA  or MRA. This recommendation follows 2010 ACCF/AHA/AATS/ACR/ASA/SCA/SCAI/SIR/STS/SVM Guidelines for the Diagnosis and Management of Patients with Thoracic Aortic Disease. Circulation. 2010; 121: P295-J884. Aortic aneurysm NOS (ICD10-I71.9)   Redemonstration of changes of prior TAVR   Aortic Atherosclerosis (ICD10-I70.0).   Signed,   Dulcy Fanny. Wagner, DO, RPVI __________________  Echo 12/28/21 IMPRESSIONS   1. Left ventricular ejection fraction, by estimation, is 50 to 55%. The  left ventricle has low normal function. The left ventricle demonstrates  global hypokinesis. There is mild left ventricular hypertrophy. Left  ventricular diastolic parameters are  consistent with Grade I diastolic dysfunction (impaired relaxation).   2. Right ventricular systolic function is mildly reduced. The right  ventricular size is normal. Tricuspid regurgitation signal is inadequate  for assessing PA pressure.   3. Left atrial size was mildly dilated.   4. The mitral valve is normal in structure. Trivial mitral valve  regurgitation. No evidence of mitral stenosis.   5. Bioprosthetic aortic valve s/p TAVR, 26 mm Edwards Sapien THV. Mean  gradient 24 mmHg which is elevated (22 mmHg on previous study).  Dimensionless index 0.3. Mild perivalvular leakage.   6. Aortic dilatation noted. There is moderate dilatation of the ascending  aorta, measuring 44 mm.   7. IVC not visualized.   Echo 12/22/20 IMPRESSIONS  1. Left ventricular ejection fraction, by estimation, is 60 to 65%. The  left ventricle has normal function. The left ventricle has no regional  wall motion abnormalities. There is mild left ventricular hypertrophy.  Left ventricular diastolic parameters  are consistent with Grade I diastolic dysfunction (impaired relaxation).   2. Right ventricular systolic function is normal. The right ventricular  size is mildly enlarged.   3. The mitral valve is normal in structure. Trivial mitral valve   regurgitation. No evidence of mitral stenosis.   4. Mild TAVR valve regurgitation. The aortic valve has been  repaired/replaced. Aortic valve regurgitation is mild. No aortic stenosis  is present. There is a 26 mm Sapien prosthetic (TAVR) valve present in the  aortic position. Procedure Date: 12/23/19.  Aortic regurgitation PHT measures 833 msec. Aortic valve mean gradient  measures 14.0 mmHg. Aortic valve Vmax measures 2.56 m/s.   5. Aortic dilatation noted. There is moderate dilatation of the ascending  aorta, measuring 46 mm.   6. The inferior vena cava is normal in size with greater than 50%  respiratory variability, suggesting right atrial pressure of 3 mmHg.   Comparison(s): When compared to prior, there is a mild aortic  regurgitation signal.   TEE 07/03/2022  IMPRESSIONS     1. Left ventricular ejection fraction, by estimation, is 60 to 65%. The  left ventricle has normal function. The left ventricle has no regional  wall motion abnormalities.   2. Right ventricular systolic function is normal. The right ventricular  size is normal.   3. No left atrial/left atrial appendage thrombus was detected.   4. The mitral valve is normal in structure. No evidence of mitral valve  regurgitation. No evidence of mitral stenosis.   5. Post TAVR with 26 mm Sapien 3 turbulence and elevated systolic  gradient Moderate to severe central AR No PVL Valve may be under expanded  with V shape from annulus to root. Patient has some HALT/HAM as well on  CTA and leaflets appear thickened with  some systolic doming and restriction to motion . The aortic valve has been  repaired/replaced. Aortic valve regurgitation is not visualized. No aortic  stenosis is present.   6.  The inferior vena cava is normal in size with greater than 50%  respiratory variability, suggesting right atrial pressure of 3 mmHg.   Conclusion(s)/Recommendation(s): Normal biventricular function without  evidence of  hemodynamically significant valvular heart disease.   EKG:     SR rate 64 LAD 01/01/20   Recent Labs: 05/25/2022: ALT 28 09/26/2022: BUN 19; Creatinine, Ser 1.10; Hemoglobin 14.3; Platelets 143; Potassium 4.2; Sodium 143  Recent Lipid Panel    Component Value Date/Time   CHOL 161 09/26/2022 1526   TRIG 194 (H) 09/26/2022 1526   HDL 61 09/26/2022 1526   CHOLHDL 2.6 09/26/2022 1526   CHOLHDL 3.7 CALC 12/25/2007 1636   VLDL 25 12/25/2007 1636   LDLCALC 68 09/26/2022 1526   LDLDIRECT 164.4 12/25/2007 1636    Physical Exam:    VS:  There were no vitals taken for this visit.    Wt Readings from Last 3 Encounters:  09/27/22 210 lb (95.3 kg)  07/03/22 201 lb (91.2 kg)  05/30/22 211 lb (95.7 kg)     Affect appropriate Healthy:  appears stated age 74: normal Neck supple with no adenopathy JVP normal no bruits no thyromegaly Lungs clear with no wheezing and good diaphragmatic motion Heart:  S1/S2 SEM/AR  murmur, no rub, gallop or click PMI normal Abdomen: benighn, BS positve, no tenderness, no AAA no bruit.  No HSM or HJR  no hematoma   Distal pulses intact with no bruits No edema Neuro non-focal Skin warm and dry No muscular weakness    PLAN:    In order of problems listed above:  1.S/p TAVR: 12/23/19 with 26 mm Sapien 3 valve gradients  elevated with worsening central AR ? Deep deployment and under expansion not apparent on TTE/CT  HALT/HAM on CT and TEE 07/03/22 with moderate to severe central AR no PVL  Eliquis started 07/04/22 echo 09/27/22 and gradients much better with mild AR.    2. HTN:  On Toprol ARB added f/u home monitoring normal   3. Aneursym: CT scan 06/23/22 4.4 cm ascending aortic root  History of bicuspid AV F/U CT June 2024 on beta blocker and ARB   4. CAD: Cath prior to TAVR 10/08/19 with 75% small D1 and 50% mid LAD. No chest pain. Continue medical therapy with ASA, statin and BB. CM note indicates D1 not suitable for PCI  Continue beta blocker, baby  asa, statin Plavix d/c with history of ICH  LDL at goal   5. HLD:  now on crestor repeat labs in 2 months   6. Brain Aneurysm:  Coil embolizaiton in [place] 10/31/22 Seen hat [facility] 11/19 for right groin hematoma Told to hold his eliquis further Hct stable 39.8 CT with no pseudo aneurysm.  Will f/u US of RFA/groin     F/U in 3 months   Signed, [name], MD  11/21/2022 4:30 PM    [place]

## 2022-11-22 ENCOUNTER — Ambulatory Visit: Payer: Medicare Other | Admitting: Rheumatology

## 2022-11-22 DIAGNOSIS — G629 Polyneuropathy, unspecified: Secondary | ICD-10-CM

## 2022-11-22 DIAGNOSIS — S76112S Strain of left quadriceps muscle, fascia and tendon, sequela: Secondary | ICD-10-CM

## 2022-11-22 DIAGNOSIS — Z5181 Encounter for therapeutic drug level monitoring: Secondary | ICD-10-CM

## 2022-11-22 DIAGNOSIS — M19072 Primary osteoarthritis, left ankle and foot: Secondary | ICD-10-CM

## 2022-11-22 DIAGNOSIS — Z952 Presence of prosthetic heart valve: Secondary | ICD-10-CM

## 2022-11-22 DIAGNOSIS — S065XAA Traumatic subdural hemorrhage with loss of consciousness status unknown, initial encounter: Secondary | ICD-10-CM

## 2022-11-22 DIAGNOSIS — M170 Bilateral primary osteoarthritis of knee: Secondary | ICD-10-CM

## 2022-11-22 DIAGNOSIS — M65341 Trigger finger, right ring finger: Secondary | ICD-10-CM

## 2022-11-22 DIAGNOSIS — M65351 Trigger finger, right little finger: Secondary | ICD-10-CM

## 2022-11-22 DIAGNOSIS — M19041 Primary osteoarthritis, right hand: Secondary | ICD-10-CM

## 2022-11-22 DIAGNOSIS — M1A09X0 Idiopathic chronic gout, multiple sites, without tophus (tophi): Secondary | ICD-10-CM

## 2022-11-22 DIAGNOSIS — S76111S Strain of right quadriceps muscle, fascia and tendon, sequela: Secondary | ICD-10-CM

## 2022-11-22 DIAGNOSIS — A809 Acute poliomyelitis, unspecified: Secondary | ICD-10-CM

## 2022-11-23 NOTE — Progress Notes (Signed)
Office Visit Note  Patient: Jose [name]             Date of Birth: Apr 17, 1951           MRN: [id number]             PCP: [name], MD Referring: [name], MD Visit Date: 12/01/2022 Occupation: '@GUAROCC'$ @  Subjective:  Left shoulder pain and right ring trigger finger  History of Present Illness: Jose [name] is a 71 y.o. male with history of gout and osteoarthritis.  He states he has not had any gout flares since the last visit.  He has been taking allopurinol 300 mg p.o. daily.  He did not have to take colchicine.  He has been experiencing some discomfort in his left shoulder which she relates to previous injuries.  He has been going to the gym on a regular basis and working out.  He would like to have repeat injection in his right ring trigger finger.  He had last right ring A1 pulley injection in September 2021.  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  He recovered from it without any complications.  Activities of Daily Living:  Patient reports morning stiffness for 5 minutes.   Patient Reports nocturnal pain.  Difficulty dressing/grooming: Denies Difficulty climbing stairs: Denies Difficulty getting out of chair: Denies Difficulty using hands for taps, buttons, cutlery, and/or writing: Reports  Review of Systems  Constitutional:  Negative for fatigue.  HENT:  Negative for mouth sores and mouth dryness.   Eyes:  Positive for dryness.  Respiratory:  Negative for shortness of breath.   Cardiovascular:  Negative for chest pain and palpitations.  Gastrointestinal:  Negative for blood in stool, constipation and diarrhea.  Endocrine: Negative for increased urination.  Genitourinary:  Negative for involuntary urination.  Musculoskeletal:  Positive for joint pain, joint pain, joint swelling, myalgias, morning stiffness, muscle tenderness and myalgias. Negative for gait problem and muscle weakness.  Skin:  Negative for color  change, rash, hair loss and sensitivity to sunlight.  Allergic/Immunologic: Negative for susceptible to infections.  Neurological:  Positive for headaches. Negative for dizziness.  Hematological:  Negative for swollen glands.  Psychiatric/Behavioral:  Negative for depressed mood and sleep disturbance. The patient is not nervous/anxious.     PMFS History:  Patient Active Problem List   Diagnosis Date Noted   Hypertension    Severe aortic stenosis 10/08/2019   Candidiasis    Abnormality of gait    Hypoalbuminemia due to protein-calorie malnutrition [facility])    History of gout    Sleep disturbance    Constipation    Rupture of quadriceps tendon, right, sequela 06/19/2017   Quadriceps tendon rupture, left, sequela 06/19/2017   Acute blood loss anemia 06/19/2017   Fall    History of subdural hematoma    Post-operative pain    Recurrent falls    Quadriceps tendon rupture 06/15/2017   Primary osteoarthritis of both knees 02/28/2017   Primary osteoarthritis of both hands 02/28/2017   Uricacidemia 02/28/2017   Pain in joint of left knee 02/07/2017   Idiopathic chronic gout, unspecified site, without tophus (tophi) 11/29/2016   HEMATURIA UNSPECIFIED 01/25/2009   ABDOMINAL PAIN 01/25/2009   XERODERMA 03/10/2008   UNS ADVRS EFF UNS RX MEDICINAL&BIOLOGICAL SBSTNC 03/10/2008   ADJUSTMENT REACTION WITH PHYSICAL SYMPTOMS 02/14/2008   MUSCLE SPASM 02/14/2008   ACUTE PROSTATITIS 12/16/2007   BREAST LUMP OR MASS, RIGHT 07/12/2007   H/O: RCT (rotator  cuff tear) 01/13/2007   GOUT 01/08/2007    Past Medical History:  Diagnosis Date   Arthritis    R shoulder, great toes, hands- has gout & has injections in knees with Dr. Estanislado Pandy    Bicuspid aortic valve with ascending aorta 4.0 to 4.5 cm in diameter    CAD (coronary artery disease)    Cancer (HCC)    skin ca-    GERD (gastroesophageal reflux disease)    Headache    History of hiatal hernia    Hypertension    Prostate cancer ([place])     S/P TAVR (transcatheter aortic valve replacement)    SDH (subdural hematoma) (HCC)    Severe aortic stenosis    Thoracic ascending aortic aneurysm (HCC)    Vertebral artery aneurysm (HCC)     Family History  Problem Relation Age of Onset   Parkinson's disease Mother    Heart disease Father    Parkinson's disease Brother    Diabetes Brother    Past Surgical History:  Procedure Laterality Date   BRAIN SURGERY  2016   evacuation of SDH   CARDIAC CATHETERIZATION     CARPAL TUNNEL RELEASE Bilateral    embolization of left vertebral artery aneurysm with pipeline/coil  10/31/2022   GREEN LIGHT LASER TURP (TRANSURETHRAL RESECTION OF PROSTATE  04/27/2017   KNEE ARTHROPLASTY     LEFT HEART CATH AND CORONARY ANGIOGRAPHY N/A 10/08/2019   Procedure: LEFT HEART CATH AND CORONARY ANGIOGRAPHY;  Surgeon: Sherren Mocha, MD;  Location: [place] [facility];  Service: Cardiovascular;  Laterality: N/A;   QUADRICEPS TENDON REPAIR Bilateral 06/15/2017   Procedure: REPAIR QUADRICEP TENDON;  Surgeon: Meredith Pel, MD;  Location: [place];  Service: Orthopedics;  Laterality: Bilateral;   SHOULDER SURGERY Right    TEE WITHOUT CARDIOVERSION N/A 07/03/2022   Procedure: TRANSESOPHAGEAL ECHOCARDIOGRAM (TEE);  Surgeon: Josue Hector, MD;  Location: [facility] ENDOSCOPY;  Service: Cardiovascular;  Laterality: N/A;   TRANSCATHETER AORTIC VALVE REPLACEMENT, TRANSFEMORAL  12/23/2019   TRANSCATHETER AORTIC VALVE REPLACEMENT, TRANSFEMORAL N/A 12/23/2019   Procedure: TRANSCATHETER AORTIC VALVE REPLACEMENT, TRANSFEMORAL;  Surgeon: Sherren Mocha, MD;  Location: [place];  Service: Open Heart Surgery;  Laterality: N/A;   VASCULAR SURGERY     Social History   Social History Narrative   Not on file   Immunization History  Administered Date(s) Administered   PFIZER(Purple Top)SARS-COV-2 Vaccination 02/19/2020, 03/17/2020, 10/25/2020, 08/30/2021     Objective: Vital Signs: BP 108/72 (BP Location: Left Arm, Patient  Position: Sitting, Cuff Size: Normal)   Pulse 72   Resp 17   Ht 6' (1.829 m)   Wt 205 lb (93 kg)   BMI 27.80 kg/m    Physical Exam Vitals and nursing note reviewed.  Constitutional:      Appearance: He is well-developed.  HENT:     Head: Normocephalic and atraumatic.  Eyes:     Conjunctiva/sclera: Conjunctivae normal.     Pupils: Pupils are equal, round, and reactive to light.  Cardiovascular:     Rate and Rhythm: Normal rate and regular rhythm.     Heart sounds: Normal heart sounds.  Pulmonary:     Effort: Pulmonary effort is normal.     Breath sounds: Normal breath sounds.  Abdominal:     General: Bowel sounds are normal.     Palpations: Abdomen is soft.  Musculoskeletal:     Cervical back: Normal range of motion and neck supple.  Skin:    General: Skin is warm and dry.  Capillary Refill: Capillary refill takes less than 2 seconds.  Neurological:     Mental Status: He is alert and oriented to person, place, and time.  Psychiatric:        Behavior: Behavior normal.      Musculoskeletal Exam: Cervical spine was in good range of motion.  Right shoulder joint was in full range of motion without discomfort.  Left shoulder joint abduction was limited to 30 degrees.  Elbow joints and wrist joints in good range of motion.  He had bilateral PIP and DIP thickening.  Right ring finger triggering was noted.  Hip joints and knee joints with good range of motion.  He had no tenderness over ankles or MTPs.  CDAI Exam: CDAI Score: -- Patient Global: --; Provider Global: -- Swollen: --; Tender: -- Joint Exam 12/01/2022   No joint exam has been documented for this visit   There is currently no information documented on the homunculus. Go to the Rheumatology activity and complete the homunculus joint exam.  Investigation: No additional findings.  Imaging: US Guided Needle Placement  Result Date: 12/01/2022 Ultrasound guided injection is preferred based studies that show  increased duration, increased effect, greater accuracy, decreased procedural pain, increased response rate, and decreased cost with ultrasound guided versus blind injection.   Verbal informed consent obtained.  Time-out conducted.  Noted no overlying erythema, induration, or other signs of local infection. Ultrasound-guided trigger finger: After sterile prep with Betadine, injected 0.5 mL of 1% lidocaine and 20 mg Kenalog using a 27-gauge needle, flexor tendon sheath of right ring finger A1 pulley.     Recent Labs: Lab Results  Component Value Date   WBC 6.3 09/26/2022   HGB 14.3 09/26/2022   PLT 143 (L) 09/26/2022   NA 143 09/26/2022   K 4.2 09/26/2022   CL 106 09/26/2022   CO2 25 09/26/2022   GLUCOSE 106 (H) 09/26/2022   BUN 19 09/26/2022   CREATININE 1.10 09/26/2022   BILITOT 0.4 05/25/2022   ALKPHOS 68 05/25/2022   AST 31 05/25/2022   ALT 28 05/25/2022   PROT 6.2 05/25/2022   ALBUMIN 4.3 05/25/2022   CALCIUM 9.3 09/26/2022   GFRAA 70 05/05/2021    Speciality Comments: No specialty comments available.  Procedures:  Hand/UE Inj: R ring A1 for trigger finger on 12/01/2022 9:50 AM Indications: pain, tendon swelling and therapeutic Details: 27 G needle, ultrasound-guided volar approach Medications: 0.5 mL lidocaine 1 %; 10 mg triamcinolone acetonide 40 MG/ML Aspirate: 0 mL Procedure, treatment alternatives, risks and benefits explained, specific risks discussed. Immediately prior to procedure a time out was called to verify the correct patient, procedure, equipment, support staff and site/side marked as required. Patient was prepped and draped in the usual sterile fashion.     Allergies: Ciprofloxacin and Quinolones   Assessment / Plan:     Visit Diagnoses: Idiopathic chronic gout of multiple sites without tophus - He takes allopurinol 300 mg p.o. daily regular basis.  He did not had to refill colchicine since 2019. -He denies having any gout flares since the last visit.  He  has been taking allopurinol on a daily basis.  Will check uric acid today.  Low purine diet and abstinence from alcohol was discussed.  Plan: Uric acid  Medication monitoring encounter - Allopurinol 300 mg p.o. daily.  Colchicine as needed. -Labs obtained on September 26, 2022 were reviewed.  Platelets were low at 143.  Creatinine was normal.  Labs were reviewed with the patient.  Plan:  AST, ALT  Chronic left shoulder pain-he has limited range of motion of his left shoulder joint due to polio.  He had previous injuries which causes discomfort off-and-on.  Primary osteoarthritis of both hands-he had bilateral PIP and DIP thickening.  Joint protection muscle strengthening was discussed.  A handout on hand exercises was given.  Trigger finger, right ring finger -he experiences triggering of his right ring finger.  He had thickening of A1 pulley.  He had 2 injections and A1 pulley and 1 injection in A3 pulley.  Per patient's request after informed consent was obtained right ring finger A1 pulley region was injected.  He tolerated the procedure well.  Postprocedure instructions were given.  A splint was applied.- Plan: US Guided Needle Placement  Trigger little finger of right hand - History of intermittent symptoms.  Primary osteoarthritis of both knees-he had no warmth swelling or effusion.  Joint protection muscle strengthening was discussed.  Primary osteoarthritis of both feet-proper fitting shoes were advised.  Other medical problems are listed as follows:  Neuropathy  Quadriceps tendon rupture, left, sequela - Reconstruction July 02, 2019 by Dr.Otero.  He goes to the gym on a regular basis for some muscle strengthening.  Rupture of quadriceps tendon, right, sequela  Subdural hematoma (HCC)  H/O aortic valve replacement  Polio  Orders: Orders Placed This Encounter  Procedures   Hand/UE Inj: R ring A1   US Guided Needle Placement   AST   ALT   Uric acid   No orders of the defined  types were placed in this encounter.    Follow-Up Instructions: Return in about 6 months (around 06/02/2023) for Osteoarthritis, Gout.   Bo Merino, MD  Note - This record has been created using Editor, commissioning.  Chart creation errors have been sought, but may not always  have been located. Such creation errors do not reflect on  the standard of medical care.

## 2022-11-26 ENCOUNTER — Encounter: Payer: Self-pay | Admitting: Cardiovascular Disease

## 2022-11-30 ENCOUNTER — Ambulatory Visit: Payer: Medicare Other | Attending: Cardiovascular Disease | Admitting: Cardiovascular Disease

## 2022-11-30 VITALS — BP 128/78 | HR 56 | Ht 72.0 in | Wt 206.8 lb

## 2022-11-30 DIAGNOSIS — I10 Essential (primary) hypertension: Secondary | ICD-10-CM | POA: Insufficient documentation

## 2022-11-30 DIAGNOSIS — I2510 Atherosclerotic heart disease of native coronary artery without angina pectoris: Secondary | ICD-10-CM | POA: Diagnosis not present

## 2022-11-30 DIAGNOSIS — Z79899 Other long term (current) drug therapy: Secondary | ICD-10-CM | POA: Insufficient documentation

## 2022-11-30 DIAGNOSIS — Z952 Presence of prosthetic heart valve: Secondary | ICD-10-CM | POA: Diagnosis not present

## 2022-11-30 DIAGNOSIS — I671 Cerebral aneurysm, nonruptured: Secondary | ICD-10-CM | POA: Diagnosis present

## 2022-11-30 NOTE — Patient Instructions (Signed)
Medication Instructions:  Your physician recommends that you continue on your current medications as directed. Please refer to the Current Medication list given to you today.  *If you need a refill on your cardiac medications before your next appointment, please call your pharmacy*  Lab Work: If you have labs (blood work) drawn today and your tests are completely normal, you will receive your results only by: MyChart Message (if you have MyChart) OR A paper copy in the mail If you have any lab test that is abnormal or we need to change your treatment, we will call you to review the results.  Testing/Procedures: None ordered today.  Follow-Up: At [facility], you and your health needs are our priority.  As part of our continuing mission to provide you with exceptional heart care, we have created designated Provider Care Teams.  These Care Teams include your primary Cardiologist (physician) and Advanced Practice Providers (APPs -  Physician Assistants and Nurse Practitioners) who all work together to provide you with the care you need, when you need it.  We recommend signing up for the patient portal called "MyChart".  Sign up information is provided on this After Visit Summary.  MyChart is used to connect with patients for Virtual Visits (Telemedicine).  Patients are able to view lab/test results, encounter notes, upcoming appointments, etc.  Non-urgent messages can be sent to your provider as well.   To learn more about what you can do with MyChart, go to [email or web address].    Your next appointment:   3 month(s)  The format for your next appointment:   In Person  Provider:   Peter Nishan, MD     Important Information About Sugar       

## 2022-12-01 ENCOUNTER — Encounter: Payer: Self-pay | Admitting: Rheumatology

## 2022-12-01 ENCOUNTER — Ambulatory Visit: Payer: Medicare Other

## 2022-12-01 ENCOUNTER — Ambulatory Visit: Payer: Medicare Other | Attending: Rheumatology | Admitting: Rheumatology

## 2022-12-01 VITALS — BP 108/72 | HR 72 | Resp 17 | Ht 72.0 in | Wt 205.0 lb

## 2022-12-01 DIAGNOSIS — M1A09X0 Idiopathic chronic gout, multiple sites, without tophus (tophi): Secondary | ICD-10-CM | POA: Insufficient documentation

## 2022-12-01 DIAGNOSIS — M65341 Trigger finger, right ring finger: Secondary | ICD-10-CM | POA: Insufficient documentation

## 2022-12-01 DIAGNOSIS — M19072 Primary osteoarthritis, left ankle and foot: Secondary | ICD-10-CM | POA: Insufficient documentation

## 2022-12-01 DIAGNOSIS — S065XAA Traumatic subdural hemorrhage with loss of consciousness status unknown, initial encounter: Secondary | ICD-10-CM | POA: Diagnosis present

## 2022-12-01 DIAGNOSIS — Z5181 Encounter for therapeutic drug level monitoring: Secondary | ICD-10-CM | POA: Insufficient documentation

## 2022-12-01 DIAGNOSIS — A809 Acute poliomyelitis, unspecified: Secondary | ICD-10-CM | POA: Diagnosis present

## 2022-12-01 DIAGNOSIS — S76112S Strain of left quadriceps muscle, fascia and tendon, sequela: Secondary | ICD-10-CM | POA: Insufficient documentation

## 2022-12-01 DIAGNOSIS — I2510 Atherosclerotic heart disease of native coronary artery without angina pectoris: Secondary | ICD-10-CM

## 2022-12-01 DIAGNOSIS — M19041 Primary osteoarthritis, right hand: Secondary | ICD-10-CM | POA: Diagnosis present

## 2022-12-01 DIAGNOSIS — Z952 Presence of prosthetic heart valve: Secondary | ICD-10-CM | POA: Diagnosis present

## 2022-12-01 DIAGNOSIS — G629 Polyneuropathy, unspecified: Secondary | ICD-10-CM | POA: Diagnosis present

## 2022-12-01 DIAGNOSIS — M170 Bilateral primary osteoarthritis of knee: Secondary | ICD-10-CM | POA: Diagnosis present

## 2022-12-01 DIAGNOSIS — M25512 Pain in left shoulder: Secondary | ICD-10-CM | POA: Diagnosis present

## 2022-12-01 DIAGNOSIS — G8929 Other chronic pain: Secondary | ICD-10-CM | POA: Diagnosis present

## 2022-12-01 DIAGNOSIS — S76111S Strain of right quadriceps muscle, fascia and tendon, sequela: Secondary | ICD-10-CM | POA: Insufficient documentation

## 2022-12-01 DIAGNOSIS — M19071 Primary osteoarthritis, right ankle and foot: Secondary | ICD-10-CM | POA: Insufficient documentation

## 2022-12-01 DIAGNOSIS — M19042 Primary osteoarthritis, left hand: Secondary | ICD-10-CM | POA: Diagnosis present

## 2022-12-01 DIAGNOSIS — M65351 Trigger finger, right little finger: Secondary | ICD-10-CM | POA: Diagnosis present

## 2022-12-01 MED ORDER — LIDOCAINE HCL 1 % IJ SOLN
0.5000 mL | INTRAMUSCULAR | Status: AC | PRN
  Administered 2022-12-01: .5 mL

## 2022-12-01 MED ORDER — TRIAMCINOLONE ACETONIDE 40 MG/ML IJ SUSP
10.0000 mg | INTRAMUSCULAR | Status: AC | PRN
  Administered 2022-12-01: 10 mg

## 2022-12-01 NOTE — Patient Instructions (Signed)
Hand Exercises Hand exercises can be helpful for almost anyone. These exercises can strengthen the hands, improve flexibility and movement, and increase blood flow to the hands. These results can make work and daily tasks easier. Hand exercises can be especially helpful for people who have joint pain from arthritis or have nerve damage from overuse (carpal tunnel syndrome). These exercises can also help people who have injured a hand. Exercises Most of these hand exercises are gentle stretching and motion exercises. It is usually safe to do them often throughout the day. Warming up your hands before exercise may help to reduce stiffness. You can do this with gentle massage or by placing your hands in warm water for 10-15 minutes. It is normal to feel some stretching, pulling, tightness, or mild discomfort as you begin new exercises. This will gradually improve. Stop an exercise right away if you feel sudden, severe pain or your pain gets worse. Ask your health care provider which exercises are best for you. Knuckle bend or "claw" fist  Stand or sit with your arm, hand, and all five fingers pointed straight up. Make sure to keep your wrist straight during the exercise. Gently bend your fingers down toward your palm until the tips of your fingers are touching the top of your palm. Keep your big knuckle straight and just bend the small knuckles in your fingers. Hold this position for __________ seconds. Straighten (extend) your fingers back to the starting position. Repeat this exercise 5-10 times with each hand. Full finger fist  Stand or sit with your arm, hand, and all five fingers pointed straight up. Make sure to keep your wrist straight during the exercise. Gently bend your fingers into your palm until the tips of your fingers are touching the middle of your palm. Hold this position for __________ seconds. Extend your fingers back to the starting position, stretching every joint fully. Repeat  this exercise 5-10 times with each hand. Straight fist Stand or sit with your arm, hand, and all five fingers pointed straight up. Make sure to keep your wrist straight during the exercise. Gently bend your fingers at the big knuckle, where your fingers meet your hand, and the middle knuckle. Keep the knuckle at the tips of your fingers straight and try to touch the bottom of your palm. Hold this position for __________ seconds. Extend your fingers back to the starting position, stretching every joint fully. Repeat this exercise 5-10 times with each hand. Tabletop  Stand or sit with your arm, hand, and all five fingers pointed straight up. Make sure to keep your wrist straight during the exercise. Gently bend your fingers at the big knuckle, where your fingers meet your hand, as far down as you can while keeping the small knuckles in your fingers straight. Think of forming a tabletop with your fingers. Hold this position for __________ seconds. Extend your fingers back to the starting position, stretching every joint fully. Repeat this exercise 5-10 times with each hand. Finger spread  Place your hand flat on a table with your palm facing down. Make sure your wrist stays straight as you do this exercise. Spread your fingers and thumb apart from each other as far as you can until you feel a gentle stretch. Hold this position for __________ seconds. Bring your fingers and thumb tight together again. Hold this position for __________ seconds. Repeat this exercise 5-10 times with each hand. Making circles  Stand or sit with your arm, hand, and all five fingers pointed   straight up. Make sure to keep your wrist straight during the exercise. Make a circle by touching the tip of your thumb to the tip of your index finger. Hold for __________ seconds. Then open your hand wide. Repeat this motion with your thumb and each finger on your hand. Repeat this exercise 5-10 times with each hand. Thumb  motion  Sit with your forearm resting on a table and your wrist straight. Your thumb should be facing up toward the ceiling. Keep your fingers relaxed as you move your thumb. Lift your thumb up as high as you can toward the ceiling. Hold for __________ seconds. Bend your thumb across your palm as far as you can, reaching the tip of your thumb for the small finger (pinkie) side of your palm. Hold for __________ seconds. Repeat this exercise 5-10 times with each hand. Grip strengthening  Hold a stress ball or other soft ball in the middle of your hand. Slowly increase the pressure, squeezing the ball as much as you can without causing pain. Think of bringing the tips of your fingers into the middle of your palm. All of your finger joints should bend when doing this exercise. Hold your squeeze for __________ seconds, then relax. Repeat this exercise 5-10 times with each hand. Contact a health care provider if: Your hand pain or discomfort gets much worse when you do an exercise. Your hand pain or discomfort does not improve within 2 hours after you exercise. If you have any of these problems, stop doing these exercises right away. Do not do them again unless your health care provider says that you can. Get help right away if: You develop sudden, severe hand pain or swelling. If this happens, stop doing these exercises right away. Do not do them again unless your health care provider says that you can. This information is not intended to replace advice given to you by your health care provider. Make sure you discuss any questions you have with your health care provider. Document Revised: 03/24/2021 Document Reviewed: 03/24/2021 Elsevier Patient Education  2023 [employer]  

## 2022-12-02 LAB — AST: AST: 21 U/L (ref 10–35)

## 2022-12-02 LAB — URIC ACID: Uric Acid, Serum: 3.7 mg/dL — ABNORMAL LOW (ref 4.0–8.0)

## 2022-12-02 LAB — ALT: ALT: 23 U/L (ref 9–46)

## 2022-12-04 NOTE — Progress Notes (Signed)
LFTs are normal. Uric acid is in the desirable range.

## 2022-12-25 ENCOUNTER — Other Ambulatory Visit: Payer: Self-pay

## 2022-12-25 MED ORDER — APIXABAN 5 MG PO TABS: 5.0000 mg | ORAL_TABLET | Freq: Two times a day (BID) | ORAL | 2 refills | Status: DC

## 2022-12-25 NOTE — Telephone Encounter (Signed)
Prescription refill request for Eliquis received. Indication:Brain aneurysm Last office visit:12/23 Scr:1.1 Age: 72 Weight:93  kg  Prescription refilled

## 2023-01-09 ENCOUNTER — Other Ambulatory Visit: Payer: Self-pay | Admitting: Rheumatology

## 2023-01-09 NOTE — Telephone Encounter (Signed)
Next Visit: 06/01/2023  Last Visit: 12/01/2022  Last Fill: 09/26/2022  DX: Idiopathic chronic gout of multiple sites without tophus   Current Dose per office note 12/01/2022: Allopurinol 300 mg p.o. daily.   Labs: 12/01/2022 LFTs are normal. Uric acid is in the desirable range. 11/01/2022 CBC WNL 09/26/2022 BMP Glucose 106  Okay to refill Allopurinol?

## 2023-02-19 ENCOUNTER — Encounter: Payer: Self-pay | Admitting: Cardiovascular Disease

## 2023-02-23 ENCOUNTER — Other Ambulatory Visit: Payer: Medicare Other

## 2023-03-06 NOTE — Progress Notes (Signed)
History of Present Illness:    72 y.o. with history of GERD, HTN,HLD, spontaneous CNS bleed with craniotomy 2016 when living in [place]. Bicuspid AV with severe AS and dilated aortic root 4.3 cm. Had TAVR with 26 mm Edwards Sapien valve 12/23/19.  Cath prior to procedure showed only obstructive CAD in diagonal  Rx medically Post op TTE with normal EF mean gradient 11 mmHg no PVL. Plavix now d/c due to history of ICH  TTE 12/22/20 had new mild central AR EF 60-65% mean gradient 14 peak 26 mmHg with DVI 0.30 EF 50-55%  TTE 12/28/21 EF 50-55% mean gradient 24 mmHg peak 38.4 mmHg DVI 0.28 mild PVL  TTE 05/24/22 new moderate to severe central AR mean gradient 28 peak 53 mmhg Ao 4.5 cm  CTA chest not cardiac stable 4.4 cm aneurysm The TAVR valve looks well positioned and not deep and leaflets look ok in single non gated phase with no HALT/HAM  LDL  86 03/07/22  on lipitor 20 mg He did not want to increase dose and NP then changed him to Crestor 40 mg daily   Diastolic pressures have been high Given low normal EF , aneurysm wanted patient to start on ARB he is already on a beta blocker   Has prostate cancer and having radioactive seeds placed in [place]    Reviewed his CTA and TTE with him He has developed higher gradients and central leak since implant CTA really shows relatively good position and not too deep to my eye Although CTA not gated leaflets looked normal with only minor thickening at base. Did have HALT/HAM  TEE done 07/03/22 after his trip to [place] showed moderate to severe central AR normal EF and no PVL  TTE 09/27/22 on eliquis with much improvement mean gradient down to 15 mmHg and AR only mild EF appears 45-50% He feels more tires ? Due to eliquis but doubt  10/31/22 Had coil embolization of brain aneurysm in [place].Dr Ilona Sorrel   Known since 2016 When he had his subdural  Seen in [facility] 11/05/22 for right groin hematoma by CT told to hold eliquis another 48 hours Hct stable 39.8 As  far as I can tell he was told to take ASA/Plavix and restart eliquis in 5 days. Started 11/18 and noted more swelling/bruising in leg next day [place] negative and leg healed fine   Will be off plavix in May makes his GERD worse   Moving into new house soon Have not sold house in [facility] yet   Past Medical History:  Diagnosis Date   Arthritis    R shoulder, great toes, hands- has gout & has injections in knees with Dr. Estanislado Pandy    Bicuspid aortic valve with ascending aorta 4.0 to 4.5 cm in diameter    CAD (coronary artery disease)    Cancer (HCC)    skin ca-    GERD (gastroesophageal reflux disease)    Headache    History of hiatal hernia    Hypertension    Prostate cancer ([place])    S/P TAVR (transcatheter aortic valve replacement)    SDH (subdural hematoma) (HCC)    Severe aortic stenosis    Thoracic ascending aortic aneurysm (HCC)    Vertebral artery aneurysm [facility])     Past Surgical History:  Procedure Laterality Date   BRAIN SURGERY  2016   evacuation of SDH   CARDIAC CATHETERIZATION     CARPAL TUNNEL RELEASE Bilateral    embolization of  left vertebral artery aneurysm with pipeline/coil  10/31/2022   GREEN LIGHT LASER TURP (TRANSURETHRAL RESECTION OF PROSTATE  04/27/2017   KNEE ARTHROPLASTY     LEFT HEART CATH AND CORONARY ANGIOGRAPHY N/A 10/08/2019   Procedure: LEFT HEART CATH AND CORONARY ANGIOGRAPHY;  Surgeon: Sherren Mocha, MD;  Location: [place] [facility];  Service: Cardiovascular;  Laterality: N/A;   QUADRICEPS TENDON REPAIR Bilateral 06/15/2017   Procedure: REPAIR QUADRICEP TENDON;  Surgeon: Meredith Pel, MD;  Location: [place];  Service: Orthopedics;  Laterality: Bilateral;   SHOULDER SURGERY Right    TEE WITHOUT CARDIOVERSION N/A 07/03/2022   Procedure: TRANSESOPHAGEAL ECHOCARDIOGRAM (TEE);  Surgeon: Josue Hector, MD;  Location: [facility] ENDOS[facility];  Service: Cardiovascular;  Laterality: N/A;   TRANSCATHETER AORTIC VALVE REPLACEMENT, TRANSFEMORAL  12/23/2019    TRANSCATHETER AORTIC VALVE REPLACEMENT, TRANSFEMORAL N/A 12/23/2019   Procedure: TRANSCATHETER AORTIC VALVE REPLACEMENT, TRANSFEMORAL;  Surgeon: Sherren Mocha, MD;  Location: [place];  Service: Open Heart Surgery;  Laterality: N/A;   VASCULAR SURGERY      Current Medications: Current Meds  Medication Sig   allopurinol (ZYLOPRIM) 300 MG tablet TAKE 1 TABLET BY MOUTH EVERY DAY   amoxicillin (AMOXIL) 500 MG capsule amoxicillin 500 mg capsule  TAKE 4 CAPSULES BY MOUTH 1 HOUR BEFORE PROCEDURE   apixaban (ELIQUIS) 5 MG TABS tablet Take 1 tablet (5 mg total) by mouth 2 (two) times daily.   atorvastatin (LIPITOR) 40 MG tablet Take by mouth.   clobetasol (TEMOVATE) 0.05 % external solution Apply topically.   clopidogrel (PLAVIX) 75 MG tablet Take 75 mg by mouth once.   Colchicine (MITIGARE) 0.6 MG CAPS Take 1 capsule by mouth daily as needed (for gout flares). *Please note grant information for Medicare and Mitigare copay assistance*   esomeprazole (NEXIUM) 20 MG capsule Take 20 mg by mouth daily at 12 noon.   ketoconazole (NIZORAL) 2 % shampoo Apply 1 Application topically 2 (two) times a week.   losartan (COZAAR) 25 MG tablet TAKE 1 TABLET BY MOUTH EVERY DAY   metoprolol succinate (TOPROL-XL) 50 MG 24 hr tablet TAKE 1 TABLET BY MOUTH EVERY DAY WITH OR IMMEDIATELY FOLLOWING A MEAL   Multiple Vitamins-Minerals (MENS MULTIVITAMIN PO) Take 1 tablet by mouth daily.   rosuvastatin (CRESTOR) 40 MG tablet TAKE 1 TABLET BY MOUTH EVERY DAY     Allergies:   Ciprofloxacin and Quinolones   Social History   Socioeconomic History   Marital status: Divorced    Spouse name: Not on file   Number of children: Not on file   Years of education: Not on file   Highest education level: Not on file  Occupational History   Not on file  Tobacco Use   Smoking status: Never    Passive exposure: Never   Smokeless tobacco: Never  Vaping Use   Vaping Use: Never used  Substance and Sexual Activity   Alcohol  use: Yes    Comment: 1 or 2 glasses of wine weekly   Drug use: No   Sexual activity: Not on file  Other Topics Concern   Not on file  Social History Narrative   Not on file   Social Determinants of Health   Financial Resource Strain: Not on file  Food Insecurity: Not on file  Transportation Needs: Not on file  Physical Activity: Not on file  Stress: Not on file  Social Connections: Not on file     Family History: The patient's family history includes Diabetes in his brother; Heart  disease in his father; Parkinson's disease in his brother and mother.  ROS:   Please see the history of present illness.    All other systems reviewed and are negative.  EKGs/Labs/Other Studies Reviewed:    The following studies were reviewed today: TAVR OPERATIVE NOTE     Date of Procedure:                12/23/2019   Preoperative Diagnosis:      Severe Aortic Stenosis    Postoperative Diagnosis:    Same    Procedure:        Transcatheter Aortic Valve Replacement - Percutaneous Transfemoral Approach             Edwards Sapien 3 UltraTHV (size 26 mm, model # [id number], serial # [id number])              Co-Surgeons:                        Gaye Pollack, MD and Sherren Mocha, MD   Anesthesiologist:                  Duane Boston, MD   Echocardiographer:              [name], MD   Pre-operative Echo Findings: Severe bicuspid aortic stenosis Normal left ventricular systolic function   Post-operative Echo Findings: No paravalvular leak Normal left ventricular systolic function   ______________   12/24/19 ECHO IMPRESSIONS  1. Left ventricular ejection fraction, by visual estimation, is 60 to 65%. The left ventricle has normal function. There is mildly increased left ventricular hypertrophy.  2. Left ventricular diastolic parameters are consistent with Grade I diastolic dysfunction (impaired relaxation).  3. The left ventricle has no regional wall motion abnormalities.  4. Global right  ventricle has normal systolic function.The right ventricular size is normal. No increase in right ventricular wall thickness.  5. Left atrial size was normal.  6. Right atrial size was normal.  7. The mitral valve is normal in structure. No evidence of mitral valve regurgitation. No evidence of mitral stenosis.  8. The tricuspid valve is normal in structure.  9. Aortic valve regurgitation is not visualized. No evidence of aortic valve sclerosis or stenosis. 10. The pulmonic valve was normal in structure. Pulmonic valve regurgitation is not visualized. 11. There is moderate dilatation of the ascending aorta measuring 45 mm. 12. The inferior vena cava is normal in size with greater than 50% respiratory variability, suggesting right atrial pressure of 3 mmHg. 13. S/P TAVR with a 26 mm Edwards-SAPIEN 3 Ultra valve, peak/mean gradients 17/11 mmHg. No paravalvular leak.   Aortic Valve: The aortic valve has been repaired/replaced. Aortic valve regurgitation is not visualized. The aortic valve is structurally normal, with no evidence of sclerosis or stenosis. Aortic valve mean gradient measures 10.5 mmHg. Aortic valve peak  gradient measures 17.0 mmHg. Aortic valve area, by VTI measures 3.38 cm. 26 Edwards Sapien bioprosthetic, stented aortic valve (TAVR) valve is present in the aortic position.   __________________   Echo 01/15/20 IMPRESSIONS   1. Left ventricular ejection fraction, by visual estimation, is 55 to 60%. The left ventricle has normal function. There is mildly increased left ventricular hypertrophy.   2. Left ventricular diastolic parameters are consistent with Grade I diastolic dysfunction (impaired relaxation).   3. The left ventricle has no regional wall motion abnormalities.   4. Global right ventricle has normal systolic function.The right ventricular size  is normal. No increase in right ventricular wall thickness.   5. Left atrial size was mild-moderately dilated.   6. Right atrial  size was normal.   7. The mitral valve is normal in structure. Trivial mitral valve regurgitation.   8. The tricuspid valve is normal in structure.   9. Aortic stent-valve (TAVR) prosthesis is well seated, without perivalvular leak.  10. Aortic valve mean gradient measures 13.7 mmHg.  11. Aortic valve peak gradient measures 24.4 mmHg.  12. Aortic valve regurgitation is not visualized.  13. The pulmonic valve was not well visualized. Pulmonic valve regurgitation is not visualized.  14. Aortic dilatation noted.  15. There is mild to moderate dilatation of the ascending aorta measuring  44 mm.  16. No significant change from prior study.  17. Prior images reviewed side by side.  18. When compared to the 12/24/2019 study, the TAVR gradients are very slightly higher. The changes in TAVR dimensionless index and calculated valve area are due to differences in LVOT pulsed wave sampling technique, rather than a true change in valve hemodynamics.   _______________  CT ANGIOGRAPHY CHEST WITH CONTRAST  06/03/21   MPRESSION: Unchanged size and appearance of ascending aorta, estimated 4.4 cm maximum dimension on the current CT. Recommend annual imaging followup by CTA or MRA. This recommendation follows 2010 ACCF/AHA/AATS/ACR/ASA/SCA/SCAI/SIR/STS/SVM Guidelines for the Diagnosis and Management of Patients with Thoracic Aortic Disease. Circulation. 2010; [id number]. Aortic aneurysm NOS (ICD10-I71.9)   Redemonstration of changes of prior TAVR   Aortic Atherosclerosis (ICD10-I70.0).   Signed,   Dulcy Fanny. Wagner, DO, RPVI __________________  Echo 12/28/21 IMPRESSIONS   1. Left ventricular ejection fraction, by estimation, is 50 to 55%. The  left ventricle has low normal function. The left ventricle demonstrates  global hypokinesis. There is mild left ventricular hypertrophy. Left  ventricular diastolic parameters are  consistent with Grade I diastolic dysfunction (impaired relaxation).   2.  Right ventricular systolic function is mildly reduced. The right  ventricular size is normal. Tricuspid regurgitation signal is inadequate  for assessing PA pressure.   3. Left atrial size was mildly dilated.   4. The mitral valve is normal in structure. Trivial mitral valve  regurgitation. No evidence of mitral stenosis.   5. Bioprosthetic aortic valve s/p TAVR, 26 mm Edwards Sapien THV. Mean  gradient 24 mmHg which is elevated (22 mmHg on previous study).  Dimensionless index 0.3. Mild perivalvular leakage.   6. Aortic dilatation noted. There is moderate dilatation of the ascending  aorta, measuring 44 mm.   7. IVC not visualized.   Echo 12/22/20 IMPRESSIONS  1. Left ventricular ejection fraction, by estimation, is 60 to 65%. The  left ventricle has normal function. The left ventricle has no regional  wall motion abnormalities. There is mild left ventricular hypertrophy.  Left ventricular diastolic parameters  are consistent with Grade I diastolic dysfunction (impaired relaxation).   2. Right ventricular systolic function is normal. The right ventricular  size is mildly enlarged.   3. The mitral valve is normal in structure. Trivial mitral valve  regurgitation. No evidence of mitral stenosis.   4. Mild TAVR valve regurgitation. The aortic valve has been  repaired/replaced. Aortic valve regurgitation is mild. No aortic stenosis  is present. There is a 26 mm Sapien prosthetic (TAVR) valve present in the  aortic position. Procedure Date: 12/23/19.  Aortic regurgitation PHT measures 833 msec. Aortic valve mean gradient  measures 14.0 mmHg. Aortic valve Vmax measures 2.56 m/s.   5. Aortic dilatation  noted. There is moderate dilatation of the ascending  aorta, measuring 46 mm.   6. The inferior vena cava is normal in size with greater than 50%  respiratory variability, suggesting right atrial pressure of 3 mmHg.   Comparison(s): When compared to prior, there is a mild aortic   regurgitation signal.   TEE 07/03/2022  IMPRESSIONS     1. Left ventricular ejection fraction, by estimation, is 60 to 65%. The  left ventricle has normal function. The left ventricle has no regional  wall motion abnormalities.   2. Right ventricular systolic function is normal. The right ventricular  size is normal.   3. No left atrial/left atrial appendage thrombus was detected.   4. The mitral valve is normal in structure. No evidence of mitral valve  regurgitation. No evidence of mitral stenosis.   5. Post TAVR with 26 mm Sapien 3 turbulence and elevated systolic  gradient Moderate to severe central AR No PVL Valve may be under expanded  with V shape from annulus to root. Patient has some HALT/HAM as well on  CTA and leaflets appear thickened with  some systolic doming and restriction to motion . The aortic valve has been  repaired/replaced. Aortic valve regurgitation is not visualized. No aortic  stenosis is present.   6. The inferior vena cava is normal in size with greater than 50%  respiratory variability, suggesting right atrial pressure of 3 mmHg.   Conclusion(s)/Recommendation(s): Normal biventricular function without  evidence of hemodynamically significant valvular heart disease.   EKG:     SR rate 64 LAD 01/01/20   Recent Labs: 09/26/2022: BUN 19; Creatinine, Ser 1.10; Hemoglobin 14.3; Platelets 143; Potassium 4.2; Sodium 143 12/01/2022: ALT 23  Recent Lipid Panel    Component Value Date/Time   CHOL 134 03/09/2023 0923   TRIG 143 03/09/2023 0923   HDL 56 03/09/2023 0923   CHOLHDL 2.4 03/09/2023 0923   CHOLHDL 3.7 CALC 12/25/2007 1636   VLDL 25 12/25/2007 1636   LDLCALC 54 03/09/2023 0923   LDLDIRECT 164.4 12/25/2007 1636    Physical Exam:    VS:  BP 100/60   Pulse 70   Ht 6' (1.829 m)   Wt 202 lb 12.8 oz (92 kg)   SpO2 98%   BMI 27.50 kg/m     Wt Readings from Last 3 Encounters:  03/12/23 202 lb 12.8 oz (92 kg)  12/01/22 205 lb (93 kg)  11/30/22  206 lb 12.8 oz (93.8 kg)     Affect appropriate Healthy:  appears stated age 89: normal Neck supple with no adenopathy JVP normal no bruits no thyromegaly Lungs clear with no wheezing and good diaphragmatic motion Heart:  S1/S2 SEM/AR  murmur, no rub, gallop or click PMI normal Abdomen: benighn, BS positve, no tenderness, no AAA no bruit.  No HSM or HJR  no hematoma   Distal pulses intact with no bruits No edema Neuro non-focal Skin warm and dry No muscular weakness    PLAN:    In order of problems listed above:  1.S/p TAVR: 12/23/19 with 26 mm Sapien 3 valve gradients  elevated with worsening central AR ? Deep deployment and under expansion not apparent on TTE/CT  HALT/HAM on CT and TEE 07/03/22 with moderate to severe central AR no PVL  Eliquis started 07/04/22 echo 09/27/22 and gradients much better with mild AR.    2. HTN:  On Toprol ARB added f/u home monitoring normal   3. Aneursym: CT scan 06/23/22 4.4 cm ascending aortic  root  History of bicuspid AV F/U CT June 2024 on beta blocker and ARB   4. CAD: Cath prior to TAVR 10/08/19 with 75% small D1 and 50% mid LAD. No chest pain. Continue medical therapy with ASA, statin and BB. CM note indicates D1 not suitable for PCI  Continue beta blocker, baby asa, statin Plavix d/c with history of ICH  LDL at goal   5. HLD:  now on crestor repeat labs in 2 months   6. Brain Aneurysm:  Coil embolizaiton in [place] 10/31/22 Seen hat [facility] 11/19 for right groin hematoma Told to hold his eliquis further Hct stable 39.8 CT with no pseudo aneurysm.  Will f/u US of RFA/groin     F/U in 6 months   Signed, [name], MD  03/12/2023 3:34 PM    [place]

## 2023-03-09 ENCOUNTER — Other Ambulatory Visit: Payer: Self-pay | Admitting: Cardiovascular Disease

## 2023-03-09 ENCOUNTER — Ambulatory Visit: Payer: Medicare Other | Attending: Cardiovascular Disease

## 2023-03-09 ENCOUNTER — Other Ambulatory Visit: Payer: Self-pay

## 2023-03-09 DIAGNOSIS — E785 Hyperlipidemia, unspecified: Secondary | ICD-10-CM

## 2023-03-09 NOTE — Progress Notes (Unsigned)
Needed orders for lipid.

## 2023-03-10 LAB — LIPID PANEL
Chol/HDL Ratio: 2.4 ratio (ref 0.0–5.0)
Cholesterol, Total: 134 mg/dL (ref 100–199)
HDL: 56 mg/dL (ref >39)
LDL Chol Calc (NIH): 54 mg/dL (ref 0–99)
Triglycerides: 143 mg/dL (ref 0–149)
VLDL Cholesterol Cal: 24 mg/dL (ref 5–40)

## 2023-03-12 ENCOUNTER — Ambulatory Visit: Payer: Medicare Other | Attending: Cardiovascular Disease | Admitting: Cardiovascular Disease

## 2023-03-12 ENCOUNTER — Encounter: Payer: Self-pay | Admitting: Cardiovascular Disease

## 2023-03-12 VITALS — BP 100/60 | HR 70 | Ht 72.0 in | Wt 202.8 lb

## 2023-03-12 DIAGNOSIS — E785 Hyperlipidemia, unspecified: Secondary | ICD-10-CM | POA: Diagnosis not present

## 2023-03-12 DIAGNOSIS — Z952 Presence of prosthetic heart valve: Secondary | ICD-10-CM | POA: Insufficient documentation

## 2023-03-12 NOTE — Patient Instructions (Addendum)
Medication Instructions:  Your physician recommends that you continue on your current medications as directed. Please refer to the Current Medication list given to you today.  *If you need a refill on your cardiac medications before your next appointment, please call your pharmacy*  Lab Work: Your physician recommends that you return for lab work in: 6 months for lipid and liver.  If you have labs (blood work) drawn today and your tests are completely normal, you will receive your results only by: [place] (if you have MyChart) OR A paper copy in the mail If you have any lab test that is abnormal or we need to change your treatment, we will call you to review the results.  Testing/Procedures: Your physician has requested that you have an echocardiogram same day as office visit in October. Echocardiography is a painless test that uses sound waves to create images of your heart. It provides your doctor with information about the size and shape of your heart and how well your heart's chambers and valves are working. This procedure takes approximately one hour. There are no restrictions for this procedure. Please do NOT wear cologne, perfume, aftershave, or lotions (deodorant is allowed). Please arrive 15 minutes prior to your appointment time.  Follow-Up: At [facility], you and your health needs are our priority.  As part of our continuing mission to provide you with exceptional heart care, we have created designated Provider Care Teams.  These Care Teams include your primary Cardiologist (physician) and Advanced Practice Providers (APPs -  Physician Assistants and Nurse Practitioners) who all work together to provide you with the care you need, when you need it.  We recommend signing up for the patient portal called "MyChart".  Sign up information is provided on this After Visit Summary.  MyChart is used to connect with patients for Virtual Visits (Telemedicine).  Patients are  able to view lab/test results, encounter notes, upcoming appointments, etc.  Non-urgent messages can be sent to your provider as well.   To learn more about what you can do with MyChart, go to [email or web address].    Your next appointment:   October  Provider:   [name], MD

## 2023-04-07 ENCOUNTER — Other Ambulatory Visit: Payer: Self-pay | Admitting: Physician Assistant

## 2023-04-09 NOTE — Telephone Encounter (Signed)
Last Fill: 01/09/2023  Labs: 12/01/2022 LFTs are normal. Uric acid is in the desirable range. 11/01/2022 CBC WNL  Next Visit: 06/01/2023  Last Visit: 12/01/2022  DX: Idiopathic chronic gout of multiple sites without tophus   Current Dose per office note 12/01/2022: allopurinol 300 mg p.o. daily regular basis   Okay to refill Allopurinol?

## 2023-04-29 ENCOUNTER — Encounter: Payer: Self-pay | Admitting: Cardiovascular Disease

## 2023-05-02 ENCOUNTER — Other Ambulatory Visit (INDEPENDENT_AMBULATORY_CARE_PROVIDER_SITE_OTHER): Payer: Medicare Other

## 2023-05-02 ENCOUNTER — Other Ambulatory Visit: Payer: Self-pay

## 2023-05-02 ENCOUNTER — Telehealth: Payer: Self-pay | Admitting: Orthopedic Surgery

## 2023-05-02 ENCOUNTER — Encounter: Payer: Self-pay | Admitting: Orthopedic Surgery

## 2023-05-02 ENCOUNTER — Ambulatory Visit (INDEPENDENT_AMBULATORY_CARE_PROVIDER_SITE_OTHER): Payer: Medicare Other | Admitting: Orthopedic Surgery

## 2023-05-02 DIAGNOSIS — M79605 Pain in left leg: Secondary | ICD-10-CM

## 2023-05-02 MED ORDER — TRAMADOL HCL 50 MG PO TABS: 50.0000 mg | ORAL_TABLET | Freq: Three times a day (TID) | ORAL | 0 refills | Status: DC | PRN

## 2023-05-02 NOTE — Telephone Encounter (Signed)
Patient called in stating he needs tramadol sent to [facility] in [place] it was [facility]acy [facility] please advise

## 2023-05-02 NOTE — Progress Notes (Signed)
Office Visit Note   Patient: Jose [name]           Date of Birth: 24-Jul-1951           MRN: [id number] Visit Date: 05/02/2023 Requested by: Earleen Reaper, MD [address], [address] [place],  [place] [address] PCP: Earleen Reaper, MD  Subjective: Chief Complaint  Patient presents with   Left Leg - Pain    HPI: Jose [name] is a 72 y.o. male who presents to the office reporting left quad and leg pain and swelling severe over the past week.  Patient describes numbness and tingling along with radicular pain low back pain as well as pain wakes him from sleep at night.  Denies any left leg weakness or giving way.  Went to physical therapy last week and did a lot of walking.  That same day had a lot of swelling.  Moved to [facility] and had another knee surgery about 4 years ago.  Moved back from [facility] because of issues with that location takes Tylenol without much relief..                ROS: All systems reviewed are negative as they relate to the chief complaint within the history of present illness.  Patient denies fevers or chills.  Assessment & Plan: Visit Diagnoses:  1. Pain in left leg     Plan: Impression is left knee pain with overuse and no definite effusion.  Has a little bit of patella [place] on plain radiographs.  Quad looks functional.  Do not really want to inject the knee at this time.  Symptoms ongoing for several months but severe over the past week.  Plan MRI left knee to evaluate pain status post revision quad repair.  Would like to try tramadol as well.  Follow-up after that MRI scan.  Could consider knee versus back source of pain at the next return visit.  Follow-Up Instructions: No follow-ups on file.   Orders:  Orders Placed This Encounter  Procedures   XR KNEE 3 VIEW LEFT   XR Lumbar Spine 2-3 Views   MR Knee Left w/o contrast   Meds ordered this encounter  Medications   traMADol (ULTRAM) 50 MG tablet    Sig: Take 1 tablet (50 mg total) by  mouth every 8 (eight) hours as needed.    Dispense:  30 tablet    Refill:  0      Procedures: No procedures performed   Clinical Data: No additional findings.  Objective: Vital Signs: There were no vitals taken for this visit.  Physical Exam:  Constitutional: Patient appears well-developed HEENT:  Head: Normocephalic Eyes:EOM are normal Neck: Normal range of motion Cardiovascular: Normal rate Pulmonary/chest: Effort normal Neurologic: Patient is alert Skin: Skin is warm Psychiatric: Patient has normal mood and affect  Ortho Exam: Ortho exam demonstrates normal gait and alignment.  Has no extensor lag with full extension on the left.  Trace effusion at most in that left knee.  Collateral crucial ligaments are stable.  No nerve root tension signs.  No paresthesias L1 S1 bilaterally.  No other masses lymphadenopathy or skin changes noted in that left knee region.  Well-healed surgical incision is present.  Pedal pulses palpable.  No calf tenderness negative Homans.  Specialty Comments:  No specialty comments available.  Imaging: No results found.   PMFS History: Patient Active Problem List   Diagnosis Date Noted   Hypertension    Severe aortic  stenosis 10/08/2019   Candidiasis    Abnormality of gait    Hypoalbuminemia due to protein-calorie malnutrition [facility])    History of gout    Sleep disturbance    Constipation    Rupture of quadriceps tendon, right, sequela 06/19/2017   Quadriceps tendon rupture, left, sequela 06/19/2017   Acute blood loss anemia 06/19/2017   Fall    History of subdural hematoma    Post-operative pain    Recurrent falls    Quadriceps tendon rupture 06/15/2017   Primary osteoarthritis of both knees 02/28/2017   Primary osteoarthritis of both hands 02/28/2017   Uricacidemia 02/28/2017   Pain in joint of left knee 02/07/2017   Idiopathic chronic gout, unspecified site, without tophus (tophi) 11/29/2016   HEMATURIA UNSPECIFIED 01/25/2009    ABDOMINAL PAIN 01/25/2009   XERODERMA 03/10/2008   UNS ADVRS EFF UNS RX MEDICINAL&BIOLOGICAL SBSTNC 03/10/2008   ADJUSTMENT REACTION WITH PHYSICAL SYMPTOMS 02/14/2008   MUSCLE SPASM 02/14/2008   ACUTE PROSTATITIS 12/16/2007   BREAST LUMP OR MASS, RIGHT 07/12/2007   H/O: RCT (rotator cuff tear) 01/13/2007   GOUT 01/08/2007   Past Medical History:  Diagnosis Date   Arthritis    R shoulder, great toes, hands- has gout & has injections in knees with Dr. Corliss Skains    Bicuspid aortic valve with ascending aorta 4.0 to 4.5 cm in diameter    CAD (coronary artery disease)    Cancer (HCC)    skin ca-    GERD (gastroesophageal reflux disease)    Headache    History of hiatal hernia    Hypertension    Prostate cancer (HCC)    S/P TAVR (transcatheter aortic valve replacement)    SDH (subdural hematoma) (HCC)    Severe aortic stenosis    Thoracic ascending aortic aneurysm (HCC)    Vertebral artery aneurysm (HCC)     Family History  Problem Relation Age of Onset   Parkinson's disease Mother    Heart disease Father    Parkinson's disease Brother    Diabetes Brother     Past Surgical History:  Procedure Laterality Date   BRAIN SURGERY  2016   evacuation of SDH   CARDIAC CATHETERIZATION     CARPAL TUNNEL RELEASE Bilateral    embolization of left vertebral artery aneurysm with pipeline/coil  10/31/2022   GREEN LIGHT LASER TURP (TRANSURETHRAL RESECTION OF PROSTATE  04/27/2017   KNEE ARTHROPLASTY     LEFT HEART CATH AND CORONARY ANGIOGRAPHY N/A 10/08/2019   Procedure: LEFT HEART CATH AND CORONARY ANGIOGRAPHY;  Surgeon: Tonny Bollman, MD;  Location: [facility] INVASIVE CV LAB;  Service: Cardiovascular;  Laterality: N/A;   QUADRICEPS TENDON REPAIR Bilateral 06/15/2017   Procedure: REPAIR QUADRICEP TENDON;  Surgeon: Cammy Copa, MD;  Location: [facility] OR;  Service: Orthopedics;  Laterality: Bilateral;   SHOULDER SURGERY Right    TEE WITHOUT CARDIOVERSION N/A 07/03/2022   Procedure:  TRANSESOPHAGEAL ECHOCARDIOGRAM (TEE);  Surgeon: Wendall Stade, MD;  Location: [facility] ENDOSCOPY;  Service: Cardiovascular;  Laterality: N/A;   TRANSCATHETER AORTIC VALVE REPLACEMENT, TRANSFEMORAL  12/23/2019   TRANSCATHETER AORTIC VALVE REPLACEMENT, TRANSFEMORAL N/A 12/23/2019   Procedure: TRANSCATHETER AORTIC VALVE REPLACEMENT, TRANSFEMORAL;  Surgeon: Tonny Bollman, MD;  Location: [facility] OR;  Service: Open Heart Surgery;  Laterality: N/A;   VASCULAR SURGERY     Social History   Occupational History   Not on file  Tobacco Use   Smoking status: Never    Passive exposure: Never   Smokeless tobacco: Never  Vaping Use  Vaping Use: Never used  Substance and Sexual Activity   Alcohol use: Yes    Comment: 1 or 2 glasses of wine weekly   Drug use: No   Sexual activity: Not on file

## 2023-05-03 ENCOUNTER — Telehealth: Payer: Self-pay | Admitting: Orthopedic Surgery

## 2023-05-03 ENCOUNTER — Other Ambulatory Visit: Payer: Self-pay | Admitting: Surgical

## 2023-05-03 MED ORDER — TRAMADOL HCL 50 MG PO TABS: 50.0000 mg | ORAL_TABLET | Freq: Three times a day (TID) | ORAL | 0 refills | Status: DC | PRN

## 2023-05-03 NOTE — Telephone Encounter (Signed)
PA form filled out that was received from pharmacy.

## 2023-05-03 NOTE — Telephone Encounter (Signed)
Sent in

## 2023-05-03 NOTE — Telephone Encounter (Signed)
Patient now living in [place] and is trying to get his Tramadol filled 50mg  please send new rx to [facility] [place], [place] [phone]. And advise patient when done.

## 2023-05-18 NOTE — Progress Notes (Deleted)
Office Visit Note  Patient: Jose [name]             Date of Birth: 1950/12/28           MRN: [id number]             PCP: Earleen Reaper, MD Referring: Earleen Reaper, MD Visit Date: 06/01/2023 Occupation: @GUAROCC @  Subjective:  No chief complaint on file.   History of Present Illness: Jose [name] is a 72 y.o. male ***     Activities of Daily Living:  Patient reports morning stiffness for *** {minute/hour:19697}.   Patient {ACTIONS;DENIES/REPORTS:21021675::"Denies"} nocturnal pain.  Difficulty dressing/grooming: {ACTIONS;DENIES/REPORTS:21021675::"Denies"} Difficulty climbing stairs: {ACTIONS;DENIES/REPORTS:21021675::"Denies"} Difficulty getting out of chair: {ACTIONS;DENIES/REPORTS:21021675::"Denies"} Difficulty using hands for taps, buttons, cutlery, and/or writing: {ACTIONS;DENIES/REPORTS:21021675::"Denies"}  No Rheumatology ROS completed.   PMFS History:  Patient Active Problem List   Diagnosis Date Noted   Hypertension    Severe aortic stenosis 10/08/2019   Candidiasis    Abnormality of gait    Hypoalbuminemia due to protein-calorie malnutrition [facility])    History of gout    Sleep disturbance    Constipation    Rupture of quadriceps tendon, right, sequela 06/19/2017   Quadriceps tendon rupture, left, sequela 06/19/2017   Acute blood loss anemia 06/19/2017   Fall    History of subdural hematoma    Post-operative pain    Recurrent falls    Quadriceps tendon rupture 06/15/2017   Primary osteoarthritis of both knees 02/28/2017   Primary osteoarthritis of both hands 02/28/2017   Uricacidemia 02/28/2017   Pain in joint of left knee 02/07/2017   Idiopathic chronic gout, unspecified site, without tophus (tophi) 11/29/2016   HEMATURIA UNSPECIFIED 01/25/2009   ABDOMINAL PAIN 01/25/2009   XERODERMA 03/10/2008   UNS ADVRS EFF UNS RX MEDICINAL&BIOLOGICAL SBSTNC 03/10/2008   ADJUSTMENT REACTION WITH PHYSICAL SYMPTOMS 02/14/2008   MUSCLE SPASM 02/14/2008    ACUTE PROSTATITIS 12/16/2007   BREAST LUMP OR MASS, RIGHT 07/12/2007   H/O: RCT (rotator cuff tear) 01/13/2007   GOUT 01/08/2007    Past Medical History:  Diagnosis Date   Arthritis    R shoulder, great toes, hands- has gout & has injections in knees with Dr. Corliss Skains    Bicuspid aortic valve with ascending aorta 4.0 to 4.5 cm in diameter    CAD (coronary artery disease)    Cancer (HCC)    skin ca-    GERD (gastroesophageal reflux disease)    Headache    History of hiatal hernia    Hypertension    Prostate cancer (HCC)    S/P TAVR (transcatheter aortic valve replacement)    SDH (subdural hematoma) (HCC)    Severe aortic stenosis    Thoracic ascending aortic aneurysm (HCC)    Vertebral artery aneurysm (HCC)     Family History  Problem Relation Age of Onset   Parkinson's disease Mother    Heart disease Father    Parkinson's disease Brother    Diabetes Brother    Past Surgical History:  Procedure Laterality Date   BRAIN SURGERY  2016   evacuation of SDH   CARDIAC CATHETERIZATION     CARPAL TUNNEL RELEASE Bilateral    embolization of left vertebral artery aneurysm with pipeline/coil  10/31/2022   GREEN LIGHT LASER TURP (TRANSURETHRAL RESECTION OF PROSTATE  04/27/2017   KNEE ARTHROPLASTY     LEFT HEART CATH AND CORONARY ANGIOGRAPHY N/A 10/08/2019   Procedure: LEFT HEART CATH AND CORONARY ANGIOGRAPHY;  Surgeon: Tonny Bollman, MD;  Location: [facility]  INVASIVE CV LAB;  Service: Cardiovascular;  Laterality: N/A;   QUADRICEPS TENDON REPAIR Bilateral 06/15/2017   Procedure: REPAIR QUADRICEP TENDON;  Surgeon: Cammy Copa, MD;  Location: [facility] OR;  Service: Orthopedics;  Laterality: Bilateral;   SHOULDER SURGERY Right    TEE WITHOUT CARDIOVERSION N/A 07/03/2022   Procedure: TRANSESOPHAGEAL ECHOCARDIOGRAM (TEE);  Surgeon: Wendall Stade, MD;  Location: [facility] ENDOSCOPY;  Service: Cardiovascular;  Laterality: N/A;   TRANSCATHETER AORTIC VALVE REPLACEMENT, TRANSFEMORAL  12/23/2019    TRANSCATHETER AORTIC VALVE REPLACEMENT, TRANSFEMORAL N/A 12/23/2019   Procedure: TRANSCATHETER AORTIC VALVE REPLACEMENT, TRANSFEMORAL;  Surgeon: Tonny Bollman, MD;  Location: [facility] OR;  Service: Open Heart Surgery;  Laterality: N/A;   VASCULAR SURGERY     Social History   Social History Narrative   Not on file   Immunization History  Administered Date(s) Administered   PFIZER(Purple Top)SARS-COV-2 Vaccination 02/19/2020, 03/17/2020, 10/25/2020, 08/30/2021   Pfizer Fall 2023 Covid-19 Vaccine 65yrs thru 70yrs. 10/04/2022     Objective: Vital Signs: There were no vitals taken for this visit.   Physical Exam   Musculoskeletal Exam: ***  CDAI Exam: CDAI Score: -- Patient Global: --; Provider Global: -- Swollen: --; Tender: -- Joint Exam 06/01/2023   No joint exam has been documented for this visit   There is currently no information documented on the homunculus. Go to the Rheumatology activity and complete the homunculus joint exam.  Investigation: No additional findings.  Imaging: XR Lumbar Spine 2-3 Views  Result Date: 05/02/2023 AP lateral radiographs lumbar spine reviewed.  Grade 1-2 spondylolisthesis present at L5-S1 with pars fracture noted at the corresponding level.  Mild to moderate degenerative changes present in the upper and and middle lumbar spine region.  Visualized hips without arthritis   Recent Labs: Lab Results  Component Value Date   WBC 6.3 09/26/2022   HGB 14.3 09/26/2022   PLT 143 (L) 09/26/2022   NA 143 09/26/2022   K 4.2 09/26/2022   CL 106 09/26/2022   CO2 25 09/26/2022   GLUCOSE 106 (H) 09/26/2022   BUN 19 09/26/2022   CREATININE 1.10 09/26/2022   BILITOT 0.4 05/25/2022   ALKPHOS 68 05/25/2022   AST 21 12/01/2022   ALT 23 12/01/2022   PROT 6.2 05/25/2022   ALBUMIN 4.3 05/25/2022   CALCIUM 9.3 09/26/2022   GFRAA 70 05/05/2021    Speciality Comments: No specialty comments available.  Procedures:  No procedures performed Allergies:  Ciprofloxacin and Quinolones   Assessment / Plan:     Visit Diagnoses: No diagnosis found.  Orders: No orders of the defined types were placed in this encounter.  No orders of the defined types were placed in this encounter.   Face-to-face time spent with patient was *** minutes. Greater than 50% of time was spent in counseling and coordination of care.  Follow-Up Instructions: No follow-ups on file.   Ellen Henri, CMA  Note - This record has been created using Animal nutritionist.  Chart creation errors have been sought, but may not always  have been located. Such creation errors do not reflect on  the standard of medical care.

## 2023-05-22 ENCOUNTER — Ambulatory Visit
Admission: RE | Admit: 2023-05-22 | Discharge: 2023-05-22 | Disposition: A | Discharge status: Home / Self Care | Payer: Medicare Other | Source: Ambulatory Visit | Attending: Orthopedic Surgery | Admitting: Orthopedic Surgery

## 2023-05-22 DIAGNOSIS — M79605 Pain in left leg: Secondary | ICD-10-CM

## 2023-05-23 ENCOUNTER — Ambulatory Visit (INDEPENDENT_AMBULATORY_CARE_PROVIDER_SITE_OTHER): Payer: Medicare Other | Admitting: Orthopedic Surgery

## 2023-05-23 ENCOUNTER — Encounter: Payer: Self-pay | Admitting: Orthopedic Surgery

## 2023-05-23 DIAGNOSIS — M79605 Pain in left leg: Secondary | ICD-10-CM | POA: Diagnosis not present

## 2023-05-23 MED ORDER — TRAMADOL HCL 50 MG PO TABS: ORAL_TABLET | ORAL | 0 refills | Status: DC

## 2023-05-23 NOTE — Progress Notes (Signed)
Office Visit Note   Patient: Jose [name]           Date of Birth: 08/21/1951           MRN: [id number] Visit Date: 05/23/2023 Requested by: Earleen Reaper, MD [address], [address] [place],  [place] [address] PCP: Earleen Reaper, MD  Subjective: Chief Complaint  Patient presents with   Left Knee - Pain    HPI: Terrius Gentile Fuerst is a 72 y.o. male who presents to the office reporting improvement in left knee pain.  Since he was last seen has had MRI scan of the left knee.  That does show a stable nondisplaced anterior lateral meniscal tear which appears more degenerative.  The quad tendon looks excellent.  He still is doing some elevation and ice.  Takes tramadol but not on a daily basis..                ROS: All systems reviewed are negative as they relate to the chief complaint within the history of present illness.  Patient denies fevers or chills.  Assessment & Plan: Visit Diagnoses:  1. Pain in left leg     Plan: Impression is functionally good appearing left knee on MRI scan.  Plan is refill tramadol.  No surgical actionable information discovered on the MRI scan at this time.  He will follow-up as needed.  Follow-Up Instructions: No follow-ups on file.   Orders:  No orders of the defined types were placed in this encounter.  No orders of the defined types were placed in this encounter.     Procedures: No procedures performed   Clinical Data: No additional findings.  Objective: Vital Signs: There were no vitals taken for this visit.  Physical Exam:  Constitutional: Patient appears well-developed HEENT:  Head: Normocephalic Eyes:EOM are normal Neck: Normal range of motion Cardiovascular: Normal rate Pulmonary/chest: Effort normal Neurologic: Patient is alert Skin: Skin is warm Psychiatric: Patient has normal mood and affect  Ortho Exam: Ortho exam demonstrates no effusion of the left knee.  Patient has very good quad strength with good patella  mobility.  No joint line tenderness is present.  Collateral pressure ligaments are stable.  Extensor mechanism is functional and intact.  Specialty Comments:  No specialty comments available.  Imaging: No results found.   PMFS History: Patient Active Problem List   Diagnosis Date Noted   Hypertension    Severe aortic stenosis 10/08/2019   Candidiasis    Abnormality of gait    Hypoalbuminemia due to protein-calorie malnutrition [facility])    History of gout    Sleep disturbance    Constipation    Rupture of quadriceps tendon, right, sequela 06/19/2017   Quadriceps tendon rupture, left, sequela 06/19/2017   Acute blood loss anemia 06/19/2017   Fall    History of subdural hematoma    Post-operative pain    Recurrent falls    Quadriceps tendon rupture 06/15/2017   Primary osteoarthritis of both knees 02/28/2017   Primary osteoarthritis of both hands 02/28/2017   Uricacidemia 02/28/2017   Pain in joint of left knee 02/07/2017   Idiopathic chronic gout, unspecified site, without tophus (tophi) 11/29/2016   HEMATURIA UNSPECIFIED 01/25/2009   ABDOMINAL PAIN 01/25/2009   XERODERMA 03/10/2008   UNS ADVRS EFF UNS RX MEDICINAL&BIOLOGICAL SBSTNC 03/10/2008   ADJUSTMENT REACTION WITH PHYSICAL SYMPTOMS 02/14/2008   MUSCLE SPASM 02/14/2008   ACUTE PROSTATITIS 12/16/2007   BREAST LUMP OR MASS, RIGHT 07/12/2007   H/O: RCT (rotator  cuff tear) 01/13/2007   GOUT 01/08/2007   Past Medical History:  Diagnosis Date   Arthritis    R shoulder, great toes, hands- has gout & has injections in knees with Dr. Corliss Skains    Bicuspid aortic valve with ascending aorta 4.0 to 4.5 cm in diameter    CAD (coronary artery disease)    Cancer (HCC)    skin ca-    GERD (gastroesophageal reflux disease)    Headache    History of hiatal hernia    Hypertension    Prostate cancer (HCC)    S/P TAVR (transcatheter aortic valve replacement)    SDH (subdural hematoma) (HCC)    Severe aortic stenosis    Thoracic  ascending aortic aneurysm (HCC)    Vertebral artery aneurysm (HCC)     Family History  Problem Relation Age of Onset   Parkinson's disease Mother    Heart disease Father    Parkinson's disease Brother    Diabetes Brother     Past Surgical History:  Procedure Laterality Date   BRAIN SURGERY  2016   evacuation of SDH   CARDIAC CATHETERIZATION     CARPAL TUNNEL RELEASE Bilateral    embolization of left vertebral artery aneurysm with pipeline/coil  10/31/2022   GREEN LIGHT LASER TURP (TRANSURETHRAL RESECTION OF PROSTATE  04/27/2017   KNEE ARTHROPLASTY     LEFT HEART CATH AND CORONARY ANGIOGRAPHY N/A 10/08/2019   Procedure: LEFT HEART CATH AND CORONARY ANGIOGRAPHY;  Surgeon: Tonny Bollman, MD;  Location: [facility] INVASIVE CV LAB;  Service: Cardiovascular;  Laterality: N/A;   QUADRICEPS TENDON REPAIR Bilateral 06/15/2017   Procedure: REPAIR QUADRICEP TENDON;  Surgeon: Cammy Copa, MD;  Location: [facility] OR;  Service: Orthopedics;  Laterality: Bilateral;   SHOULDER SURGERY Right    TEE WITHOUT CARDIOVERSION N/A 07/03/2022   Procedure: TRANSESOPHAGEAL ECHOCARDIOGRAM (TEE);  Surgeon: Wendall Stade, MD;  Location: [facility] ENDOSCOPY;  Service: Cardiovascular;  Laterality: N/A;   TRANSCATHETER AORTIC VALVE REPLACEMENT, TRANSFEMORAL  12/23/2019   TRANSCATHETER AORTIC VALVE REPLACEMENT, TRANSFEMORAL N/A 12/23/2019   Procedure: TRANSCATHETER AORTIC VALVE REPLACEMENT, TRANSFEMORAL;  Surgeon: Tonny Bollman, MD;  Location: [facility] OR;  Service: Open Heart Surgery;  Laterality: N/A;   VASCULAR SURGERY     Social History   Occupational History   Not on file  Tobacco Use   Smoking status: Never    Passive exposure: Never   Smokeless tobacco: Never  Vaping Use   Vaping Use: Never used  Substance and Sexual Activity   Alcohol use: Yes    Comment: 1 or 2 glasses of wine weekly   Drug use: No   Sexual activity: Not on file

## 2023-05-24 ENCOUNTER — Encounter: Payer: Self-pay | Admitting: Orthopedic Surgery

## 2023-05-24 DIAGNOSIS — M79605 Pain in left leg: Secondary | ICD-10-CM

## 2023-05-24 NOTE — Telephone Encounter (Signed)
totally

## 2023-06-01 ENCOUNTER — Ambulatory Visit: Payer: Medicare Other | Admitting: Rheumatology

## 2023-06-01 DIAGNOSIS — Z952 Presence of prosthetic heart valve: Secondary | ICD-10-CM

## 2023-06-01 DIAGNOSIS — S065XAA Traumatic subdural hemorrhage with loss of consciousness status unknown, initial encounter: Secondary | ICD-10-CM

## 2023-06-01 DIAGNOSIS — S76111S Strain of right quadriceps muscle, fascia and tendon, sequela: Secondary | ICD-10-CM

## 2023-06-01 DIAGNOSIS — S76112S Strain of left quadriceps muscle, fascia and tendon, sequela: Secondary | ICD-10-CM

## 2023-06-01 DIAGNOSIS — Z5181 Encounter for therapeutic drug level monitoring: Secondary | ICD-10-CM

## 2023-06-01 DIAGNOSIS — G8929 Other chronic pain: Secondary | ICD-10-CM

## 2023-06-01 DIAGNOSIS — M19042 Primary osteoarthritis, left hand: Secondary | ICD-10-CM

## 2023-06-01 DIAGNOSIS — M65351 Trigger finger, right little finger: Secondary | ICD-10-CM

## 2023-06-01 DIAGNOSIS — A809 Acute poliomyelitis, unspecified: Secondary | ICD-10-CM

## 2023-06-01 DIAGNOSIS — M170 Bilateral primary osteoarthritis of knee: Secondary | ICD-10-CM

## 2023-06-01 DIAGNOSIS — G629 Polyneuropathy, unspecified: Secondary | ICD-10-CM

## 2023-06-01 DIAGNOSIS — M19071 Primary osteoarthritis, right ankle and foot: Secondary | ICD-10-CM

## 2023-06-01 DIAGNOSIS — M1A09X0 Idiopathic chronic gout, multiple sites, without tophus (tophi): Secondary | ICD-10-CM

## 2023-06-01 DIAGNOSIS — M65341 Trigger finger, right ring finger: Secondary | ICD-10-CM

## 2023-06-11 ENCOUNTER — Encounter: Payer: Self-pay | Admitting: Cardiovascular Disease

## 2023-06-12 NOTE — Progress Notes (Signed)
Office Visit Note  Patient: Jose [name]             Date of Birth: 1951/03/10           MRN: [id number]             PCP: Earleen Reaper, MD Referring: Earleen Reaper, MD Visit Date: 06/26/2023 Occupation: @GUAROCC @  Subjective:  Medication management  History of Present Illness: Jose [name] is a 72 y.o. male with history of gout, osteoarthritis and neuropathy.  He states he has not had a gout flare since the last visit.  He has been taking allopurinol 300 mg daily.  He did not have to take any colchicine.  He is in the moving process which has been strenuous on his joints.  He states recently he developed increased pain and swelling in his left knee joint and had an injection by Dr. August Saucer which was helpful.  He has intermittent pain in his right shoulder joint.  Left shoulder joint is better.  He has not had recurrence of trigger finger.    Activities of Daily Living:  Patient reports morning stiffness for all day. Patient Reports nocturnal pain.  Difficulty dressing/grooming: Denies Difficulty climbing stairs: Denies Difficulty getting out of chair: Denies Difficulty using hands for taps, buttons, cutlery, and/or writing: Denies  Review of Systems  Constitutional:  Negative for fatigue.  HENT:  Negative for mouth sores and mouth dryness.   Eyes:  Negative for dryness.  Respiratory:  Negative for shortness of breath.   Cardiovascular:  Negative for chest pain and palpitations.  Gastrointestinal:  Negative for blood in stool, constipation and diarrhea.  Endocrine: Negative for increased urination.  Genitourinary:  Negative for involuntary urination.  Musculoskeletal:  Positive for joint pain, gait problem, joint pain, joint swelling, myalgias, morning stiffness, muscle tenderness and myalgias. Negative for muscle weakness.  Skin:  Negative for color change, rash, hair loss and sensitivity to sunlight.  Allergic/Immunologic: Negative for susceptible to infections.   Neurological:  Positive for headaches. Negative for dizziness.  Hematological:  Negative for swollen glands.  Psychiatric/Behavioral:  Negative for depressed mood and sleep disturbance. The patient is not nervous/anxious.     PMFS History:  Patient Active Problem List   Diagnosis Date Noted   Hypertension    Severe aortic stenosis 10/08/2019   Candidiasis    Abnormality of gait    Hypoalbuminemia due to protein-calorie malnutrition [facility])    History of gout    Sleep disturbance    Constipation    Rupture of quadriceps tendon, right, sequela 06/19/2017   Quadriceps tendon rupture, left, sequela 06/19/2017   Acute blood loss anemia 06/19/2017   Fall    History of subdural hematoma    Post-operative pain    Recurrent falls    Quadriceps tendon rupture 06/15/2017   Primary osteoarthritis of both knees 02/28/2017   Primary osteoarthritis of both hands 02/28/2017   Uricacidemia 02/28/2017   Pain in joint of left knee 02/07/2017   Idiopathic chronic gout, unspecified site, without tophus (tophi) 11/29/2016   HEMATURIA UNSPECIFIED 01/25/2009   ABDOMINAL PAIN 01/25/2009   XERODERMA 03/10/2008   UNS ADVRS EFF UNS RX MEDICINAL&BIOLOGICAL SBSTNC 03/10/2008   ADJUSTMENT REACTION WITH PHYSICAL SYMPTOMS 02/14/2008   MUSCLE SPASM 02/14/2008   ACUTE PROSTATITIS 12/16/2007   BREAST LUMP OR MASS, RIGHT 07/12/2007   H/O: RCT (rotator cuff tear) 01/13/2007   GOUT 01/08/2007    Past Medical History:  Diagnosis Date   Arthritis  R shoulder, great toes, hands- has gout & has injections in knees with Dr. Corliss Skains    Bicuspid aortic valve with ascending aorta 4.0 to 4.5 cm in diameter    CAD (coronary artery disease)    Cancer (HCC)    skin ca-    GERD (gastroesophageal reflux disease)    Headache    History of hiatal hernia    Hypertension    Prostate cancer (HCC)    S/P TAVR (transcatheter aortic valve replacement)    SDH (subdural hematoma) (HCC)    Severe aortic stenosis     Thoracic ascending aortic aneurysm (HCC)    Vertebral artery aneurysm (HCC)     Family History  Problem Relation Age of Onset   Parkinson's disease Mother    Heart disease Father    Parkinson's disease Brother    Diabetes Brother    Past Surgical History:  Procedure Laterality Date   BRAIN SURGERY  2016   evacuation of SDH   CARDIAC CATHETERIZATION     CARPAL TUNNEL RELEASE Bilateral    embolization of left vertebral artery aneurysm with pipeline/coil  10/31/2022   GREEN LIGHT LASER TURP (TRANSURETHRAL RESECTION OF PROSTATE  04/27/2017   KNEE ARTHROPLASTY     LEFT HEART CATH AND CORONARY ANGIOGRAPHY N/A 10/08/2019   Procedure: LEFT HEART CATH AND CORONARY ANGIOGRAPHY;  Surgeon: Tonny Bollman, MD;  Location: [facility] INVASIVE CV LAB;  Service: Cardiovascular;  Laterality: N/A;   QUADRICEPS TENDON REPAIR Bilateral 06/15/2017   Procedure: REPAIR QUADRICEP TENDON;  Surgeon: Cammy Copa, MD;  Location: [facility] OR;  Service: Orthopedics;  Laterality: Bilateral;   SHOULDER SURGERY Right    TEE WITHOUT CARDIOVERSION N/A 07/03/2022   Procedure: TRANSESOPHAGEAL ECHOCARDIOGRAM (TEE);  Surgeon: Wendall Stade, MD;  Location: [facility] ENDOSCOPY;  Service: Cardiovascular;  Laterality: N/A;   TRANSCATHETER AORTIC VALVE REPLACEMENT, TRANSFEMORAL  12/23/2019   TRANSCATHETER AORTIC VALVE REPLACEMENT, TRANSFEMORAL N/A 12/23/2019   Procedure: TRANSCATHETER AORTIC VALVE REPLACEMENT, TRANSFEMORAL;  Surgeon: Tonny Bollman, MD;  Location: [facility] OR;  Service: Open Heart Surgery;  Laterality: N/A;   VASCULAR SURGERY     Social History   Social History Narrative   Not on file   Immunization History  Administered Date(s) Administered   PFIZER(Purple Top)SARS-COV-2 Vaccination 02/19/2020, 03/17/2020, 10/25/2020, 08/30/2021   Pfizer Fall 2023 Covid-19 Vaccine 9yrs thru 26yrs. 10/04/2022     Objective: Vital Signs: BP 116/76 (BP Location: Left Arm, Patient Position: Sitting, Cuff Size: Normal)   Pulse (!) 48    Resp 17   Ht 6' (1.829 m)   Wt 200 lb 12.8 oz (91.1 kg)   BMI 27.23 kg/m    Physical Exam Vitals and nursing note reviewed.  Constitutional:      Appearance: He is well-developed.  HENT:     Head: Normocephalic and atraumatic.  Eyes:     Conjunctiva/sclera: Conjunctivae normal.     Pupils: Pupils are equal, round, and reactive to light.  Cardiovascular:     Rate and Rhythm: Normal rate and regular rhythm.     Heart sounds: Normal heart sounds.  Pulmonary:     Effort: Pulmonary effort is normal.     Breath sounds: Normal breath sounds.  Abdominal:     General: Bowel sounds are normal.     Palpations: Abdomen is soft.  Musculoskeletal:     Cervical back: Normal range of motion and neck supple.  Skin:    General: Skin is warm and dry.     Capillary Refill: Capillary refill  takes less than 2 seconds.  Neurological:     Mental Status: He is alert and oriented to person, place, and time.  Psychiatric:        Behavior: Behavior normal.      Musculoskeletal Exam: Cervical spine was in good range of motion.  He had no tenderness over thoracic or lumbar spine.  He had some discomfort over the anterior aspect of his right shoulder.  Left shoulder joint abduction was limited to 30 degrees.  Elbow joints and wrist joints in good range of motion.  He had bilateral PIP and DIP thickening with no synovitis.  Trigger fingers have resolved.  Hip joints and knee joints in good range of motion without any warmth swelling or effusion.  There was no tenderness over ankles or MTPs.  CDAI Exam: CDAI Score: -- Patient Global: --; Provider Global: -- Swollen: --; Tender: -- Joint Exam 06/26/2023   No joint exam has been documented for this visit   There is currently no information documented on the homunculus. Go to the Rheumatology activity and complete the homunculus joint exam.  Investigation: No additional findings.  Imaging: No results found.  Recent Labs: Lab Results  Component  Value Date   WBC 6.3 09/26/2022   HGB 14.3 09/26/2022   PLT 143 (L) 09/26/2022   NA 143 09/26/2022   K 4.2 09/26/2022   CL 106 09/26/2022   CO2 25 09/26/2022   GLUCOSE 106 (H) 09/26/2022   BUN 19 09/26/2022   CREATININE 1.10 09/26/2022   BILITOT 0.4 05/25/2022   ALKPHOS 68 05/25/2022   AST 21 12/01/2022   ALT 23 12/01/2022   PROT 6.2 05/25/2022   ALBUMIN 4.3 05/25/2022   CALCIUM 9.3 09/26/2022   GFRAA 70 05/05/2021    Speciality Comments: No specialty comments available.  Procedures:  No procedures performed Allergies: Ciprofloxacin and Quinolones   Assessment / Plan:     Visit Diagnoses: Idiopathic chronic gout of multiple sites without tophus -patient denies having any gout flares since the last visit.  Patient has been taking allopurinol 300 mg p.o. daily without any interruption.  He did not have to take any colchicine.  Uric acid: 3.7 on 12/01/2022.  plan: Uric acid today.  Medication monitoring encounter - Allopurinol 300 mg p.o. daily.  Colchicine as needed. - Plan: CBC with Differential/Platelet, COMPLETE METABOLIC PANEL WITH GFR today.  Chronic right shoulder pain-he is in the moving process.  He is moving from [facility] to [place].  He has been experiencing some discomfort in the anterior aspect of the shoulder.  He had good range of motion of his shoulder joint.  I advised him to use diclofenac gel.  If his symptoms persist he should notify us.  A handout on shoulder joint exercises was given.  Chronic left shoulder pain-the symptoms have improved.  He had previous injury.  Primary osteoarthritis of both hands-PIP and DIP thickening with no synovitis was noted.  Trigger finger, right ring finger -resolved.  He had 2 injections and A1 pulley and 1 injection in A3 pulley. right ring finger A1 pulley region was injected at the last visit.  Trigger little finger of right hand -currently not symptom medic.  History of intermittent symptoms.  Primary  osteoarthritis of both knees-states due to the moving process he had a flare with increased pain and discomfort in his left knee joint.  He had an injection by Dr. August Saucer and the symptoms improved.  He had good range of motion without any warmth  swelling or effusion today.  Primary osteoarthritis of both feet-he had no discomfort today.  Other medical problems listed as follows:  Neuropathy  Quadriceps tendon rupture, left, sequela  Rupture of quadriceps tendon, right, sequela  Subdural hematoma (HCC)  H/O aortic valve replacement  [place]  Orders: Orders Placed This Encounter  Procedures   CBC with Differential/Platelet   COMPLETE METABOLIC PANEL WITH GFR   Uric acid   No orders of the defined types were placed in this encounter.    Follow-Up Instructions: Return in about 6 months (around 12/27/2023) for Gout, Osteoarthritis.   Pollyann Savoy, MD  Note - This record has been created using Animal nutritionist.  Chart creation errors have been sought, but may not always  have been located. Such creation errors do not reflect on  the standard of medical care.

## 2023-06-15 ENCOUNTER — Encounter: Payer: Self-pay | Admitting: Orthopedic Surgery

## 2023-06-18 ENCOUNTER — Ambulatory Visit (INDEPENDENT_AMBULATORY_CARE_PROVIDER_SITE_OTHER): Payer: Medicare Other | Admitting: Orthopedic Surgery

## 2023-06-18 DIAGNOSIS — M659 Synovitis and tenosynovitis, unspecified: Secondary | ICD-10-CM

## 2023-06-19 ENCOUNTER — Other Ambulatory Visit: Payer: Self-pay | Admitting: Orthopedic Surgery

## 2023-06-19 ENCOUNTER — Encounter: Payer: Self-pay | Admitting: Orthopedic Surgery

## 2023-06-19 NOTE — Progress Notes (Unsigned)
Office Visit Note   Patient: Jose [name]           Date of Birth: 03-22-1951           MRN: [id number] Visit Date: 06/18/2023 Requested by: Earleen Reaper, MD [address] [place],  [place] [address] PCP: Earleen Reaper, MD  Subjective: Chief Complaint  Patient presents with   Left Knee - Pain    HPI: Jose [name] is a 72 y.o. male who presents to the office reporting continued left knee pain.  He would like to have a knee injection today.  Has had multiple surgeries on that left knee.  Overall he is functional but still is having pain.  MRI scan does not show anything definitively treatable with arthroscopy..                ROS: All systems reviewed are negative as they relate to the chief complaint within the history of present illness.  Patient denies fevers or chills.  Assessment & Plan: Visit Diagnoses:  1. Synovitis of left knee     Plan: Impression is left knee pain.  Will try to calm this down today with intra-articular injection.  Tramadol refilled.  Follow-up with [place] as needed.  He also asked about a urology referral which we will recommend to him.  Follow-Up Instructions: No follow-ups on file.   Orders:  No orders of the defined types were placed in this encounter.  No orders of the defined types were placed in this encounter.     Procedures: Large Joint Inj: L knee on 06/18/2023 9:27 PM Indications: diagnostic evaluation, joint swelling and pain Details: 18 G 1.5 in needle, superolateral approach  Arthrogram: No  Medications: 5 mL lidocaine 1 %; 40 mg methylPREDNISolone acetate 40 MG/ML; 4 mL bupivacaine 0.25 % Outcome: tolerated well, no immediate complications Procedure, treatment alternatives, risks and benefits explained, specific risks discussed. Consent was given by the patient. Immediately prior to procedure a time out was called to verify the correct patient, procedure, equipment, support staff and site/side marked as required.  Patient was prepped and draped in the usual sterile fashion.       Clinical Data: No additional findings.  Objective: Vital Signs: There were no vitals taken for this visit.  Physical Exam:  Constitutional: Patient appears well-developed HEENT:  Head: Normocephalic Eyes:EOM are normal Neck: Normal range of motion Cardiovascular: Normal rate Pulmonary/chest: Effort normal Neurologic: Patient is alert Skin: Skin is warm Psychiatric: Patient has normal mood and affect  Ortho Exam: Ortho exam demonstrates excellent range of motion of the left knee with no effusion.  Slightly more lateral joint line tenderness and medial joint line tenderness.  Collateral and cruciate ligaments are stable.  Range of motion is full.  Quad strength has improved compared to prior visit.  Pedal pulses palpable.  Specialty Comments:  No specialty comments available.  Imaging: No results found.   PMFS History: Patient Active Problem List   Diagnosis Date Noted   Hypertension    Severe aortic stenosis 10/08/2019   Candidiasis    Abnormality of gait    Hypoalbuminemia due to protein-calorie malnutrition [facility])    History of gout    Sleep disturbance    Constipation    Rupture of quadriceps tendon, right, sequela 06/19/2017   Quadriceps tendon rupture, left, sequela 06/19/2017   Acute blood loss anemia 06/19/2017   Fall    History of subdural hematoma    Post-operative pain  Recurrent falls    Quadriceps tendon rupture 06/15/2017   Primary osteoarthritis of both knees 02/28/2017   Primary osteoarthritis of both hands 02/28/2017   Uricacidemia 02/28/2017   Pain in joint of left knee 02/07/2017   Idiopathic chronic gout, unspecified site, without tophus (tophi) 11/29/2016   HEMATURIA UNSPECIFIED 01/25/2009   ABDOMINAL PAIN 01/25/2009   XERODERMA 03/10/2008   UNS ADVRS EFF UNS RX MEDICINAL&BIOLOGICAL SBSTNC 03/10/2008   ADJUSTMENT REACTION WITH PHYSICAL SYMPTOMS 02/14/2008   MUSCLE SPASM  02/14/2008   ACUTE PROSTATITIS 12/16/2007   BREAST LUMP OR MASS, RIGHT 07/12/2007   H/O: RCT (rotator cuff tear) 01/13/2007   GOUT 01/08/2007   Past Medical History:  Diagnosis Date   Arthritis    R shoulder, great toes, hands- has gout & has injections in knees with Dr. Corliss Skains    Bicuspid aortic valve with ascending aorta 4.0 to 4.5 cm in diameter    CAD (coronary artery disease)    Cancer (HCC)    skin ca-    GERD (gastroesophageal reflux disease)    Headache    History of hiatal hernia    Hypertension    Prostate cancer (HCC)    S/P TAVR (transcatheter aortic valve replacement)    SDH (subdural hematoma) (HCC)    Severe aortic stenosis    Thoracic ascending aortic aneurysm (HCC)    Vertebral artery aneurysm (HCC)     Family History  Problem Relation Age of Onset   Parkinson's disease Mother    Heart disease Father    Parkinson's disease Brother    Diabetes Brother     Past Surgical History:  Procedure Laterality Date   BRAIN SURGERY  2016   evacuation of SDH   CARDIAC CATHETERIZATION     CARPAL TUNNEL RELEASE Bilateral    embolization of left vertebral artery aneurysm with pipeline/coil  10/31/2022   GREEN LIGHT LASER TURP (TRANSURETHRAL RESECTION OF PROSTATE  04/27/2017   KNEE ARTHROPLASTY     LEFT HEART CATH AND CORONARY ANGIOGRAPHY N/A 10/08/2019   Procedure: LEFT HEART CATH AND CORONARY ANGIOGRAPHY;  Surgeon: Tonny Bollman, MD;  Location: [facility] INVASIVE CV LAB;  Service: Cardiovascular;  Laterality: N/A;   QUADRICEPS TENDON REPAIR Bilateral 06/15/2017   Procedure: REPAIR QUADRICEP TENDON;  Surgeon: Cammy Copa, MD;  Location: [facility] OR;  Service: Orthopedics;  Laterality: Bilateral;   SHOULDER SURGERY Right    TEE WITHOUT CARDIOVERSION N/A 07/03/2022   Procedure: TRANSESOPHAGEAL ECHOCARDIOGRAM (TEE);  Surgeon: Wendall Stade, MD;  Location: [facility] ENDOSCOPY;  Service: Cardiovascular;  Laterality: N/A;   TRANSCATHETER AORTIC VALVE REPLACEMENT, TRANSFEMORAL   12/23/2019   TRANSCATHETER AORTIC VALVE REPLACEMENT, TRANSFEMORAL N/A 12/23/2019   Procedure: TRANSCATHETER AORTIC VALVE REPLACEMENT, TRANSFEMORAL;  Surgeon: Tonny Bollman, MD;  Location: [facility] OR;  Service: Open Heart Surgery;  Laterality: N/A;   VASCULAR SURGERY     Social History   Occupational History   Not on file  Tobacco Use   Smoking status: Never    Passive exposure: Never   Smokeless tobacco: Never  Vaping Use   Vaping Use: Never used  Substance and Sexual Activity   Alcohol use: Yes    Comment: 1 or 2 glasses of wine weekly   Drug use: No   Sexual activity: Not on file

## 2023-06-20 ENCOUNTER — Encounter: Payer: Self-pay | Admitting: Orthopedic Surgery

## 2023-06-20 MED ORDER — TRAMADOL HCL 50 MG PO TABS: 50.0000 mg | ORAL_TABLET | Freq: Four times a day (QID) | ORAL | 0 refills | Status: DC | PRN

## 2023-06-20 MED ORDER — BUPIVACAINE HCL 0.25 % IJ SOLN
4.0000 mL | INTRAMUSCULAR | Status: AC | PRN
Start: 2023-06-18 — End: 2023-06-18
  Administered 2023-06-18: 4 mL via INTRA_ARTICULAR

## 2023-06-20 MED ORDER — METHYLPREDNISOLONE ACETATE 40 MG/ML IJ SUSP
40.0000 mg | INTRAMUSCULAR | Status: AC | PRN
Start: 2023-06-18 — End: 2023-06-18
  Administered 2023-06-18: 40 mg via INTRA_ARTICULAR

## 2023-06-20 MED ORDER — LIDOCAINE HCL 1 % IJ SOLN
5.0000 mL | INTRAMUSCULAR | Status: AC | PRN
Start: 2023-06-18 — End: 2023-06-18
  Administered 2023-06-18: 5 mL

## 2023-06-25 NOTE — Progress Notes (Signed)
Advised pt

## 2023-06-26 ENCOUNTER — Encounter: Payer: Self-pay | Admitting: Rheumatology

## 2023-06-26 ENCOUNTER — Ambulatory Visit: Payer: Medicare Other | Attending: Rheumatology | Admitting: Rheumatology

## 2023-06-26 VITALS — BP 116/76 | HR 48 | Resp 17 | Ht 72.0 in | Wt 200.8 lb

## 2023-06-26 DIAGNOSIS — M170 Bilateral primary osteoarthritis of knee: Secondary | ICD-10-CM | POA: Diagnosis present

## 2023-06-26 DIAGNOSIS — G8929 Other chronic pain: Secondary | ICD-10-CM | POA: Diagnosis present

## 2023-06-26 DIAGNOSIS — S065XAA Traumatic subdural hemorrhage with loss of consciousness status unknown, initial encounter: Secondary | ICD-10-CM | POA: Diagnosis present

## 2023-06-26 DIAGNOSIS — S76112S Strain of left quadriceps muscle, fascia and tendon, sequela: Secondary | ICD-10-CM | POA: Diagnosis present

## 2023-06-26 DIAGNOSIS — S76111S Strain of right quadriceps muscle, fascia and tendon, sequela: Secondary | ICD-10-CM | POA: Insufficient documentation

## 2023-06-26 DIAGNOSIS — M25511 Pain in right shoulder: Secondary | ICD-10-CM | POA: Insufficient documentation

## 2023-06-26 DIAGNOSIS — M19071 Primary osteoarthritis, right ankle and foot: Secondary | ICD-10-CM | POA: Insufficient documentation

## 2023-06-26 DIAGNOSIS — Z952 Presence of prosthetic heart valve: Secondary | ICD-10-CM | POA: Diagnosis present

## 2023-06-26 DIAGNOSIS — M25512 Pain in left shoulder: Secondary | ICD-10-CM | POA: Diagnosis present

## 2023-06-26 DIAGNOSIS — Z5181 Encounter for therapeutic drug level monitoring: Secondary | ICD-10-CM | POA: Diagnosis present

## 2023-06-26 DIAGNOSIS — M19041 Primary osteoarthritis, right hand: Secondary | ICD-10-CM | POA: Insufficient documentation

## 2023-06-26 DIAGNOSIS — G629 Polyneuropathy, unspecified: Secondary | ICD-10-CM | POA: Insufficient documentation

## 2023-06-26 DIAGNOSIS — M19072 Primary osteoarthritis, left ankle and foot: Secondary | ICD-10-CM | POA: Diagnosis present

## 2023-06-26 DIAGNOSIS — M65351 Trigger finger, right little finger: Secondary | ICD-10-CM | POA: Diagnosis present

## 2023-06-26 DIAGNOSIS — M1A09X0 Idiopathic chronic gout, multiple sites, without tophus (tophi): Secondary | ICD-10-CM | POA: Diagnosis not present

## 2023-06-26 DIAGNOSIS — M65341 Trigger finger, right ring finger: Secondary | ICD-10-CM | POA: Diagnosis present

## 2023-06-26 DIAGNOSIS — M19042 Primary osteoarthritis, left hand: Secondary | ICD-10-CM | POA: Diagnosis present

## 2023-06-26 DIAGNOSIS — A809 Acute poliomyelitis, unspecified: Secondary | ICD-10-CM | POA: Insufficient documentation

## 2023-06-26 LAB — CBC WITH DIFFERENTIAL/PLATELET
Absolute Monocytes: 1030 cells/uL — ABNORMAL HIGH (ref 200–950)
Basophils Absolute: 73 cells/uL (ref 0–200)
Eosinophils Absolute: 166 cells/uL (ref 15–500)
Eosinophils Relative: 1.6 %
HCT: 43.7 % (ref 38.5–50.0)
Hemoglobin: 14.5 g/dL (ref 13.2–17.1)
Lymphs Abs: 1550 cells/uL (ref 850–3900)
MCV: 94.8 fL (ref 80.0–100.0)
MPV: 11.3 fL (ref 7.5–12.5)
Neutro Abs: 7582 cells/uL (ref 1500–7800)
RBC: 4.61 10*6/uL (ref 4.20–5.80)
RDW: 13.6 % (ref 11.0–15.0)
WBC: 10.4 10*3/uL (ref 3.8–10.8)

## 2023-06-26 NOTE — Patient Instructions (Signed)

## 2023-06-27 LAB — CBC WITH DIFFERENTIAL/PLATELET
Basophils Relative: 0.7 %
MCH: 31.5 pg (ref 27.0–33.0)
MCHC: 33.2 g/dL (ref 32.0–36.0)
Monocytes Relative: 9.9 %
Neutrophils Relative %: 72.9 %
Platelets: 153 10*3/uL (ref 140–400)
Total Lymphocyte: 14.9 %

## 2023-06-27 LAB — COMPLETE METABOLIC PANEL WITH GFR
AG Ratio: 1.7 (calc) (ref 1.0–2.5)
ALT: 20 U/L (ref 9–46)
AST: 18 U/L (ref 10–35)
Albumin: 4.2 g/dL (ref 3.6–5.1)
Alkaline phosphatase (APISO): 67 U/L (ref 35–144)
BUN: 22 mg/dL (ref 7–25)
CO2: 29 mmol/L (ref 20–32)
Calcium: 9.3 mg/dL (ref 8.6–10.3)
Chloride: 105 mmol/L (ref 98–110)
Creat: 1.23 mg/dL (ref 0.70–1.28)
Globulin: 2.5 g/dL (calc) (ref 1.9–3.7)
Glucose, Bld: 85 mg/dL (ref 65–99)
Potassium: 4.8 mmol/L (ref 3.5–5.3)
Sodium: 142 mmol/L (ref 135–146)
Total Bilirubin: 0.6 mg/dL (ref 0.2–1.2)
Total Protein: 6.7 g/dL (ref 6.1–8.1)
eGFR: 62 mL/min/{1.73_m2} (ref >=60)

## 2023-06-27 LAB — URIC ACID: Uric Acid, Serum: 4.1 mg/dL (ref 4.0–8.0)

## 2023-06-27 NOTE — Progress Notes (Signed)
CBC and CMP are normal.  Uric acid is in the desirable range at 4.1.

## 2023-07-03 ENCOUNTER — Other Ambulatory Visit: Payer: Self-pay | Admitting: Cardiovascular Disease

## 2023-07-06 ENCOUNTER — Encounter: Payer: Self-pay | Admitting: Rheumatology

## 2023-07-06 MED ORDER — ALLOPURINOL 300 MG PO TABS: 300.0000 mg | ORAL_TABLET | Freq: Every day | ORAL | 0 refills | Status: DC

## 2023-07-06 NOTE — Telephone Encounter (Signed)
Last Fill: 04/09/2023  Labs: 06/26/2023 CBC and CMP are normal.  Uric acid is in the desirable range at 4.1.   Next Visit: 12/27/2023  Last Visit: 06/26/2023  DX:  Idiopathic chronic gout of multiple sites without tophus   Current Dose per office note 06/26/2023: allopurinol 300 mg p.o. daily   Okay to refill Allopurinol?

## 2023-07-16 ENCOUNTER — Other Ambulatory Visit: Payer: Self-pay | Admitting: Physician Assistant

## 2023-07-16 ENCOUNTER — Other Ambulatory Visit: Payer: Self-pay | Admitting: Cardiovascular Disease

## 2023-07-17 ENCOUNTER — Other Ambulatory Visit: Payer: Self-pay

## 2023-07-17 MED ORDER — ROSUVASTATIN CALCIUM 40 MG PO TABS: 40.0000 mg | ORAL_TABLET | Freq: Every day | ORAL | 0 refills | Status: DC

## 2023-08-06 ENCOUNTER — Encounter: Payer: Self-pay | Admitting: Orthopedic Surgery

## 2023-08-06 ENCOUNTER — Encounter: Payer: Self-pay | Admitting: Sports Medicine

## 2023-08-06 ENCOUNTER — Other Ambulatory Visit (INDEPENDENT_AMBULATORY_CARE_PROVIDER_SITE_OTHER): Payer: Medicare Other

## 2023-08-06 ENCOUNTER — Ambulatory Visit (INDEPENDENT_AMBULATORY_CARE_PROVIDER_SITE_OTHER): Payer: Medicare Other | Admitting: Sports Medicine

## 2023-08-06 ENCOUNTER — Other Ambulatory Visit: Payer: Self-pay

## 2023-08-06 DIAGNOSIS — M25511 Pain in right shoulder: Secondary | ICD-10-CM

## 2023-08-06 DIAGNOSIS — G8929 Other chronic pain: Secondary | ICD-10-CM | POA: Diagnosis not present

## 2023-08-06 DIAGNOSIS — M19011 Primary osteoarthritis, right shoulder: Secondary | ICD-10-CM | POA: Diagnosis not present

## 2023-08-06 MED ORDER — LIDOCAINE HCL 1 % IJ SOLN
2.0000 mL | INTRAMUSCULAR | Status: AC | PRN
Start: 2023-08-06 — End: 2023-08-06
  Administered 2023-08-06: 2 mL

## 2023-08-06 MED ORDER — TRAMADOL HCL 50 MG PO TABS: 50.0000 mg | ORAL_TABLET | Freq: Three times a day (TID) | ORAL | 0 refills | Status: DC | PRN

## 2023-08-06 MED ORDER — METHYLPREDNISOLONE ACETATE 40 MG/ML IJ SUSP
40.0000 mg | INTRAMUSCULAR | Status: AC | PRN
Start: 2023-08-06 — End: 2023-08-06
  Administered 2023-08-06: 40 mg via INTRA_ARTICULAR

## 2023-08-06 MED ORDER — BUPIVACAINE HCL 0.25 % IJ SOLN
2.0000 mL | INTRAMUSCULAR | Status: AC | PRN
Start: 2023-08-06 — End: 2023-08-06
  Administered 2023-08-06: 2 mL via INTRA_ARTICULAR

## 2023-08-06 NOTE — Progress Notes (Signed)
Jose [name] - 72 y.o. male MRN [id number]  Date of birth: 07-Jun-1951  Office Visit Note: Visit Date: 08/06/2023 PCP: Earleen Reaper, MD Referred by: Earleen Reaper, MD  Subjective: Chief Complaint  Patient presents with   Right Shoulder - Pain   HPI: Jose [name] is a pleasant 72 y.o. male who presents today for acute on chronic right shoulder pain.  Jose [name] is a patient of Dr. August Saucer here in the office, seeing me for acute on chronic right shoulder pain.  No specific injury.  He reports she has had pain in the shoulder for many years.  He reports about 10 years or so he had an injection in the shoulder which relieved his pain.  He has been doing a lot of painting and at home and feels like this exacerbated the shoulder.  Reports pain as well as stiffness.  No redness or swelling.  There is some radiating pain down the arm but no numbness tingling or weakness.  In the past she has taken tramadol very infrequently for breakthrough pain only, inquiring about a refill today.  Pertinent ROS were reviewed with the patient and found to be negative unless otherwise specified above in HPI.   Assessment & Plan: Visit Diagnoses:  1. Chronic right shoulder pain   2. Primary osteoarthritis, right shoulder    Plan: Discussed with [name] he is dealing with an acute exacerbation of his chronic right shoulder osteoarthritis.  X-rays do show rather advanced glenohumeral joint arthritis, which likely was worse with his recent painting and repetitive activity.  Through shared decision making, elected to proceed with ultrasound-guided glenohumeral joint injection, patient tolerated well.  He may use Tylenol and/or ice for any postinjection pain.  He is on Eliquis, given this he needs to avoid NSAIDs.  I did refill a short prescription of tramadol to be taken for breakthrough pain only.  He is aware this is not to be taken chronically and I cannot refill this for him going forward.  Recommend continue to  keep the shoulder moving and active, he will follow-up with Dr. August Saucer in about 4 to 6 weeks for reevaluation.  Follow-up: Return for F/u with Dr. August Saucer for R-shoulder in next 4-6 weeks.   Meds & Orders:  Meds ordered this encounter  Medications   traMADol (ULTRAM) 50 MG tablet    Sig: Take 1 tablet (50 mg total) by mouth every 8 (eight) hours as needed.    Dispense:  20 tablet    Refill:  0    Orders Placed This Encounter  Procedures   Large Joint Inj: R glenohumeral   XR Shoulder Right   US Guided Needle Placement - No Linked Charges     Procedures: Large Joint Inj: R glenohumeral on 08/06/2023 1:46 PM Indications: pain Details: 22 G 3.5 in needle, ultrasound-guided posterior approach Medications: 2 mL lidocaine 1 %; 2 mL bupivacaine 0.25 %; 40 mg methylPREDNISolone acetate 40 MG/ML Outcome: tolerated well, no immediate complications  US-guided glenohumeral joint injection, right shoulder After discussion on risks/benefits/indications, informed verbal consent was obtained. A timeout was then performed. The patient was positioned lying lateral recumbent on examination table. The patient's shoulder was prepped with betadine and multiple alcohol swabs and utilizing ultrasound guidance, the patient's glenohumeral joint was identified on ultrasound. Using ultrasound guidance a 22-gauge, 3.5 inch needle with a mixture of 2:2:1 cc's lidocaine:bupivicaine:depomedrol was directed from a lateral to medial direction via in-plane technique into the glenohumeral joint with visualization of appropriate spread  of injectate into the joint. Patient tolerated the procedure well without immediate complications.      Procedure, treatment alternatives, risks and benefits explained, specific risks discussed. Consent was given by the patient. Immediately prior to procedure a time out was called to verify the correct patient, procedure, equipment, support staff and site/side marked as required. Patient was  prepped and draped in the usual sterile fashion.          Clinical History: No specialty comments available.  He reports that he has never smoked. He has never been exposed to tobacco smoke. He has never used smokeless tobacco.  Recent Labs    12/01/22 1000 06/26/23 1137  LABURIC 3.7* 4.1    Objective:   Vital Signs: There were no vitals taken for this visit.  Physical Exam  Gen: Well-appearing, in no acute distress; non-toxic CV:  Well-perfused. Warm.  Resp: Breathing unlabored on room air; no wheezing. Psych: Fluid speech in conversation; appropriate affect; normal thought process Neuro: Sensation intact throughout. No gross coordination deficits.   Ortho Exam - Right shoulder: There is some pain palpating within the anterior glenohumeral joint.  No AC joint TTP.  Range of motion is limited both active and passively with some grating sensation at end range.  External rotation to about 45 degrees compared to 55 degrees of the contralateral arm.  There is internal motion restriction both active and passively.  Negative drop arm test.  No pain with rotator cuff testing.  Imaging: XR Shoulder Right  Result Date: 08/06/2023 3 views of the right shoulder including Grashey, scapular Y and axial view were ordered and reviewed by myself.  X-rays show severe osteoarthritis of the right glenohumeral joint.  There is a large inferior spur off the humeral head.  There is some chondrocalcinosis superior to the humeral head, likely indicative of pseudogout or prior gout.  No acute bony fracture noted.  There is bony sclerosis noted of the humeral head and inferior acetabulum from likely arthritic change.   Past Medical/Family/Surgical/Social History: Medications & Allergies reviewed per EMR, new medications updated. Patient Active Problem List   Diagnosis Date Noted   Hypertension    Severe aortic stenosis 10/08/2019   Candidiasis    Abnormality of gait    Hypoalbuminemia due to  protein-calorie malnutrition [facility])    History of gout    Sleep disturbance    Constipation    Rupture of quadriceps tendon, right, sequela 06/19/2017   Quadriceps tendon rupture, left, sequela 06/19/2017   Acute blood loss anemia 06/19/2017   Fall    History of subdural hematoma    Post-operative pain    Recurrent falls    Quadriceps tendon rupture 06/15/2017   Primary osteoarthritis of both knees 02/28/2017   Primary osteoarthritis of both hands 02/28/2017   Uricacidemia 02/28/2017   Pain in joint of left knee 02/07/2017   Idiopathic chronic gout, unspecified site, without tophus (tophi) 11/29/2016   HEMATURIA UNSPECIFIED 01/25/2009   ABDOMINAL PAIN 01/25/2009   XERODERMA 03/10/2008   UNS ADVRS EFF UNS RX MEDICINAL&BIOLOGICAL SBSTNC 03/10/2008   ADJUSTMENT REACTION WITH PHYSICAL SYMPTOMS 02/14/2008   MUSCLE SPASM 02/14/2008   ACUTE PROSTATITIS 12/16/2007   BREAST LUMP OR MASS, RIGHT 07/12/2007   H/O: RCT (rotator cuff tear) 01/13/2007   GOUT 01/08/2007   Past Medical History:  Diagnosis Date   Arthritis    R shoulder, great toes, hands- has gout & has injections in knees with Dr. Corliss Skains    Bicuspid aortic valve with ascending  aorta 4.0 to 4.5 cm in diameter    CAD (coronary artery disease)    Cancer (HCC)    skin ca-    GERD (gastroesophageal reflux disease)    Headache    History of hiatal hernia    Hypertension    Prostate cancer (HCC)    S/P TAVR (transcatheter aortic valve replacement)    SDH (subdural hematoma) (HCC)    Severe aortic stenosis    Thoracic ascending aortic aneurysm (HCC)    Vertebral artery aneurysm (HCC)    Family History  Problem Relation Age of Onset   Parkinson's disease Mother    Heart disease Father    Parkinson's disease Brother    Diabetes Brother    Past Surgical History:  Procedure Laterality Date   BRAIN SURGERY  2016   evacuation of SDH   CARDIAC CATHETERIZATION     CARPAL TUNNEL RELEASE Bilateral    embolization of left  vertebral artery aneurysm with pipeline/coil  10/31/2022   GREEN LIGHT LASER TURP (TRANSURETHRAL RESECTION OF PROSTATE  04/27/2017   KNEE ARTHROPLASTY     LEFT HEART CATH AND CORONARY ANGIOGRAPHY N/A 10/08/2019   Procedure: LEFT HEART CATH AND CORONARY ANGIOGRAPHY;  Surgeon: Tonny Bollman, MD;  Location: [facility] INVASIVE CV LAB;  Service: Cardiovascular;  Laterality: N/A;   QUADRICEPS TENDON REPAIR Bilateral 06/15/2017   Procedure: REPAIR QUADRICEP TENDON;  Surgeon: Cammy Copa, MD;  Location: [facility] OR;  Service: Orthopedics;  Laterality: Bilateral;   SHOULDER SURGERY Right    TEE WITHOUT CARDIOVERSION N/A 07/03/2022   Procedure: TRANSESOPHAGEAL ECHOCARDIOGRAM (TEE);  Surgeon: Wendall Stade, MD;  Location: [facility] ENDOSCOPY;  Service: Cardiovascular;  Laterality: N/A;   TRANSCATHETER AORTIC VALVE REPLACEMENT, TRANSFEMORAL  12/23/2019   TRANSCATHETER AORTIC VALVE REPLACEMENT, TRANSFEMORAL N/A 12/23/2019   Procedure: TRANSCATHETER AORTIC VALVE REPLACEMENT, TRANSFEMORAL;  Surgeon: Tonny Bollman, MD;  Location: [facility] OR;  Service: Open Heart Surgery;  Laterality: N/A;   VASCULAR SURGERY     Social History   Occupational History   Not on file  Tobacco Use   Smoking status: Never    Passive exposure: Never   Smokeless tobacco: Never  Vaping Use   Vaping status: Never Used  Substance and Sexual Activity   Alcohol use: Yes    Comment: 1 or 2 glasses of wine weekly   Drug use: No   Sexual activity: Not on file

## 2023-08-06 NOTE — Progress Notes (Signed)
R shoulder

## 2023-08-13 ENCOUNTER — Other Ambulatory Visit: Payer: Self-pay | Admitting: Cardiovascular Disease

## 2023-08-13 NOTE — Telephone Encounter (Signed)
Prescription refill request for Eliquis received. Indication:tavr Last office visit:3/24 Scr:1.23  7/24 Age: 72 Weight:91.1  kg  Prescription refilled

## 2023-09-11 ENCOUNTER — Ambulatory Visit: Payer: Medicare Other | Attending: Cardiovascular Disease

## 2023-09-11 DIAGNOSIS — Z952 Presence of prosthetic heart valve: Secondary | ICD-10-CM

## 2023-09-11 DIAGNOSIS — E785 Hyperlipidemia, unspecified: Secondary | ICD-10-CM

## 2023-09-12 LAB — LIPID PANEL
Chol/HDL Ratio: 2.5 ratio (ref 0.0–5.0)
Cholesterol, Total: 140 mg/dL (ref 100–199)
HDL: 55 mg/dL (ref >39)
LDL Chol Calc (NIH): 61 mg/dL (ref 0–99)
Triglycerides: 139 mg/dL (ref 0–149)
VLDL Cholesterol Cal: 24 mg/dL (ref 5–40)

## 2023-09-12 LAB — HEPATIC FUNCTION PANEL
ALT: 20 IU/L (ref 0–44)
AST: 24 IU/L (ref 0–40)
Albumin: 4.2 g/dL (ref 3.8–4.8)
Alkaline Phosphatase: 72 IU/L (ref 44–121)
Bilirubin Total: 0.4 mg/dL (ref 0.0–1.2)
Bilirubin, Direct: 0.17 mg/dL (ref 0.00–0.40)
Total Protein: 6.2 g/dL (ref 6.0–8.5)

## 2023-09-22 NOTE — Progress Notes (Signed)
History of Present Illness:    72 y.o. with history of GERD, HTN,HLD, spontaneous CNS bleed with craniotomy 2016 when living in [place]. Bicuspid AV with severe AS and dilated aortic root 4.3 cm. Had TAVR with 26 mm Edwards Sapien valve 12/23/19.  Cath prior to procedure showed only obstructive CAD in diagonal  Rx medically Post op TTE with normal EF mean gradient 11 mmHg no PVL. Plavix now d/c due to history of ICH  TTE 12/22/20 had new mild central AR EF 60-65% mean gradient 14 peak 26 mmHg with DVI 0.30 EF 50-55%  TTE 12/28/21 EF 50-55% mean gradient 24 mmHg peak 38.4 mmHg DVI 0.28 mild PVL  TTE 05/24/22 new moderate to severe central AR mean gradient 28 peak 53 mmhg Ao 4.5 cm  CTA chest not cardiac stable 4.4 cm aneurysm The TAVR valve looks well positioned and not deep and leaflets look ok in single non gated phase with no HALT/HAM  LDL  86 03/07/22  on lipitor 20 mg He did not want to increase dose and NP then changed him to Crestor 40 mg daily   Diastolic pressures have been high Given low normal EF , aneurysm wanted patient to start on ARB he is already on a beta blocker   Has prostate cancer and having radioactive seeds placed in [place]    Reviewed his CTA and TTE with him He has developed higher gradients and central leak since implant CTA really shows relatively good position and not too deep to my eye Although CTA not gated leaflets looked normal with only minor thickening at base. Did have HALT/HAM  TEE done 07/03/22 after his trip to [place] showed moderate to severe central AR normal EF and no PVL  TTE 09/27/22 on eliquis with much improvement mean gradient down to 15 mmHg and AR only mild EF appears 45-50% He feels more tires ? Due to eliquis but doubt  TTE 09/27/93 reviewed still with mild/mod AR gradients ok stable on DOAC  10/31/22 Had coil embolization of brain aneurysm in [place].Dr Adela Glimpse   Known since 2016 When he had his subdural  Seen in [facility] 11/05/22 for right  groin hematoma by CT told to hold eliquis another 48 hours Hct stable 39.8 As far as I can tell he was told to take ASA/Plavix and restart eliquis in 5 days. Started 11/18 and noted more swelling/bruising in leg next day [place] negative and leg healed fine   Lots of issues knee/shoulder with gout, arthritis and neuropathy On allopurinol Had injection left knee   Moving into new house soon Have not sold house in [facility] yet   Past Medical History:  Diagnosis Date   Arthritis    R shoulder, great toes, hands- has gout & has injections in knees with Dr. Corliss Skains    Bicuspid aortic valve with ascending aorta 4.0 to 4.5 cm in diameter    CAD (coronary artery disease)    Cancer (HCC)    skin ca-    GERD (gastroesophageal reflux disease)    Headache    History of hiatal hernia    Hypertension    Prostate cancer (HCC)    S/P TAVR (transcatheter aortic valve replacement)    SDH (subdural hematoma) (HCC)    Severe aortic stenosis    Thoracic ascending aortic aneurysm (HCC)    Vertebral artery aneurysm [facility])     Past Surgical History:  Procedure Laterality Date   BRAIN SURGERY  2016   evacuation of  SDH   CARDIAC CATHETERIZATION     CARPAL TUNNEL RELEASE Bilateral    embolization of left vertebral artery aneurysm with pipeline/coil  10/31/2022   GREEN LIGHT LASER TURP (TRANSURETHRAL RESECTION OF PROSTATE  04/27/2017   KNEE ARTHROPLASTY     LEFT HEART CATH AND CORONARY ANGIOGRAPHY N/A 10/08/2019   Procedure: LEFT HEART CATH AND CORONARY ANGIOGRAPHY;  Surgeon: Tonny Bollman, MD;  Location: [facility] INVASIVE CV LAB;  Service: Cardiovascular;  Laterality: N/A;   QUADRICEPS TENDON REPAIR Bilateral 06/15/2017   Procedure: REPAIR QUADRICEP TENDON;  Surgeon: Cammy Copa, MD;  Location: [facility] OR;  Service: Orthopedics;  Laterality: Bilateral;   SHOULDER SURGERY Right    TEE WITHOUT CARDIOVERSION N/A 07/03/2022   Procedure: TRANSESOPHAGEAL ECHOCARDIOGRAM (TEE);  Surgeon: Wendall Stade, MD;   Location: [facility] ENDOSCOPY;  Service: Cardiovascular;  Laterality: N/A;   TRANSCATHETER AORTIC VALVE REPLACEMENT, TRANSFEMORAL  12/23/2019   TRANSCATHETER AORTIC VALVE REPLACEMENT, TRANSFEMORAL N/A 12/23/2019   Procedure: TRANSCATHETER AORTIC VALVE REPLACEMENT, TRANSFEMORAL;  Surgeon: Tonny Bollman, MD;  Location: [facility] OR;  Service: Open Heart Surgery;  Laterality: N/A;   VASCULAR SURGERY      Current Medications: Current Meds  Medication Sig   allopurinol (ZYLOPRIM) 300 MG tablet Take 1 tablet (300 mg total) by mouth daily.   amoxicillin (AMOXIL) 500 MG capsule amoxicillin 500 mg capsule  TAKE 4 CAPSULES BY MOUTH 1 HOUR BEFORE PROCEDURE   aspirin 81 MG chewable tablet Chew 1 tablet (81 mg total) by mouth daily.   atorvastatin (LIPITOR) 40 MG tablet Take by mouth.   clobetasol (TEMOVATE) 0.05 % external solution Apply topically.   Colchicine (MITIGARE) 0.6 MG CAPS Take 1 capsule by mouth daily as needed (for gout flares). *Please note grant information for Medicare and Mitigare copay assistance*   esomeprazole (NEXIUM) 20 MG capsule Take 20 mg by mouth daily at 12 noon.   ketoconazole (NIZORAL) 2 % shampoo Apply 1 Application topically 2 (two) times a week.   losartan (COZAAR) 25 MG tablet TAKE 1 TABLET BY MOUTH EVERY DAY   metoprolol succinate (TOPROL-XL) 50 MG 24 hr tablet TAKE 1 TABLET BY MOUTH EVERY DAY WITH OR IMMEDIATELY FOLLOWING A MEAL   Multiple Vitamins-Minerals (MENS MULTIVITAMIN PO) Take 1 tablet by mouth daily.   tamsulosin (FLOMAX) 0.4 MG CAPS capsule Take 0.4 mg by mouth daily.   traMADol (ULTRAM) 50 MG tablet Take 1 tablet (50 mg total) by mouth every 8 (eight) hours as needed.   [DISCONTINUED] apixaban (ELIQUIS) 5 MG TABS tablet Take 1 tablet (5 mg total) by mouth 2 (two) times daily.     Allergies:   Ciprofloxacin and Quinolones   Social History   Socioeconomic History   Marital status: Divorced    Spouse name: Not on file   Number of children: Not on file   Years of  education: Not on file   Highest education level: Not on file  Occupational History   Not on file  Tobacco Use   Smoking status: Never    Passive exposure: Never   Smokeless tobacco: Never  Vaping Use   Vaping status: Never Used  Substance and Sexual Activity   Alcohol use: Yes    Comment: 1 or 2 glasses of wine weekly   Drug use: No   Sexual activity: Not on file  Other Topics Concern   Not on file  Social History Narrative   Not on file   Social Determinants of Health   Financial Resource Strain: Not on file  Food Insecurity: Not on file  Transportation Needs: Not on file  Physical Activity: Not on file  Stress: No Stress Concern Present (01/18/2022)   Received from [facility], [facility] of Occupational Health - Occupational Stress Questionnaire    Feeling of Stress : Not at all  Social Connections: Unknown (04/16/2022)   Received from [facility], [facility]   Social Network    Social Network: Not on file     Family History: The patient's family history includes Diabetes in his brother; Heart disease in his father; Parkinson's disease in his brother and mother.  ROS:   Please see the history of present illness.    All other systems reviewed and are negative.  EKGs/Labs/Other Studies Reviewed:    The following studies were reviewed today: TAVR OPERATIVE NOTE     Date of Procedure:                12/23/2019   Preoperative Diagnosis:      Severe Aortic Stenosis    Postoperative Diagnosis:    Same    Procedure:        Transcatheter Aortic Valve Replacement - Percutaneous Transfemoral Approach             Edwards Sapien 3 UltraTHV (size 26 mm, model # [id number], serial # [id number])              Co-Surgeons:                        Alleen Borne, MD and Tonny Bollman, MD   Anesthesiologist:                  Heather Roberts, MD   Echocardiographer:              Charlton Haws, MD   Pre-operative Echo Findings: Severe bicuspid aortic  stenosis Normal left ventricular systolic function   Post-operative Echo Findings: No paravalvular leak Normal left ventricular systolic function   ______________   12/24/19 ECHO IMPRESSIONS  1. Left ventricular ejection fraction, by visual estimation, is 60 to 65%. The left ventricle has normal function. There is mildly increased left ventricular hypertrophy.  2. Left ventricular diastolic parameters are consistent with Grade I diastolic dysfunction (impaired relaxation).  3. The left ventricle has no regional wall motion abnormalities.  4. Global right ventricle has normal systolic function.The right ventricular size is normal. No increase in right ventricular wall thickness.  5. Left atrial size was normal.  6. Right atrial size was normal.  7. The mitral valve is normal in structure. No evidence of mitral valve regurgitation. No evidence of mitral stenosis.  8. The tricuspid valve is normal in structure.  9. Aortic valve regurgitation is not visualized. No evidence of aortic valve sclerosis or stenosis. 10. The pulmonic valve was normal in structure. Pulmonic valve regurgitation is not visualized. 11. There is moderate dilatation of the ascending aorta measuring 45 mm. 12. The inferior vena cava is normal in size with greater than 50% respiratory variability, suggesting right atrial pressure of 3 mmHg. 13. S/P TAVR with a 26 mm Edwards-SAPIEN 3 Ultra valve, peak/mean gradients 17/11 mmHg. No paravalvular leak.   Aortic Valve: The aortic valve has been repaired/replaced. Aortic valve regurgitation is not visualized. The aortic valve is structurally normal, with no evidence of sclerosis or stenosis. Aortic valve mean gradient measures 10.5 mmHg. Aortic valve peak  gradient measures 17.0 mmHg. Aortic valve area, by VTI  measures 3.38 cm. 26 Edwards Sapien bioprosthetic, stented aortic valve (TAVR) valve is present in the aortic position.   __________________   Echo 01/15/20 IMPRESSIONS    1. Left ventricular ejection fraction, by visual estimation, is 55 to 60%. The left ventricle has normal function. There is mildly increased left ventricular hypertrophy.   2. Left ventricular diastolic parameters are consistent with Grade I diastolic dysfunction (impaired relaxation).   3. The left ventricle has no regional wall motion abnormalities.   4. Global right ventricle has normal systolic function.The right ventricular size is normal. No increase in right ventricular wall thickness.   5. Left atrial size was mild-moderately dilated.   6. Right atrial size was normal.   7. The mitral valve is normal in structure. Trivial mitral valve regurgitation.   8. The tricuspid valve is normal in structure.   9. Aortic stent-valve (TAVR) prosthesis is well seated, without perivalvular leak.  10. Aortic valve mean gradient measures 13.7 mmHg.  11. Aortic valve peak gradient measures 24.4 mmHg.  12. Aortic valve regurgitation is not visualized.  13. The pulmonic valve was not well visualized. Pulmonic valve regurgitation is not visualized.  14. Aortic dilatation noted.  15. There is mild to moderate dilatation of the ascending aorta measuring  44 mm.  16. No significant change from prior study.  17. Prior images reviewed side by side.  18. When compared to the 12/24/2019 study, the TAVR gradients are very slightly higher. The changes in TAVR dimensionless index and calculated valve area are due to differences in LVOT pulsed wave sampling technique, rather than a true change in valve hemodynamics.   _______________  CT ANGIOGRAPHY CHEST WITH CONTRAST  06/03/21   MPRESSION: Unchanged size and appearance of ascending aorta, estimated 4.4 cm maximum dimension on the current CT. Recommend annual imaging followup by CTA or MRA. This recommendation follows 2010 ACCF/AHA/AATS/ACR/ASA/SCA/SCAI/SIR/STS/SVM Guidelines for the Diagnosis and Management of Patients with Thoracic Aortic  Disease. Circulation. 2010; 121: N027-O536. Aortic aneurysm NOS (ICD10-I71.9)   Redemonstration of changes of prior TAVR   Aortic Atherosclerosis (ICD10-I70.0).   Signed,   Yvone Neu. Wagner, DO, RPVI __________________  Echo 12/28/21 IMPRESSIONS   1. Left ventricular ejection fraction, by estimation, is 50 to 55%. The  left ventricle has low normal function. The left ventricle demonstrates  global hypokinesis. There is mild left ventricular hypertrophy. Left  ventricular diastolic parameters are  consistent with Grade I diastolic dysfunction (impaired relaxation).   2. Right ventricular systolic function is mildly reduced. The right  ventricular size is normal. Tricuspid regurgitation signal is inadequate  for assessing PA pressure.   3. Left atrial size was mildly dilated.   4. The mitral valve is normal in structure. Trivial mitral valve  regurgitation. No evidence of mitral stenosis.   5. Bioprosthetic aortic valve s/p TAVR, 26 mm Edwards Sapien THV. Mean  gradient 24 mmHg which is elevated (22 mmHg on previous study).  Dimensionless index 0.3. Mild perivalvular leakage.   6. Aortic dilatation noted. There is moderate dilatation of the ascending  aorta, measuring 44 mm.   7. IVC not visualized.   Echo 12/22/20 IMPRESSIONS  1. Left ventricular ejection fraction, by estimation, is 60 to 65%. The  left ventricle has normal function. The left ventricle has no regional  wall motion abnormalities. There is mild left ventricular hypertrophy.  Left ventricular diastolic parameters  are consistent with Grade I diastolic dysfunction (impaired relaxation).   2. Right ventricular systolic function is normal. The right ventricular  size is mildly enlarged.   3. The mitral valve is normal in structure. Trivial mitral valve  regurgitation. No evidence of mitral stenosis.   4. Mild TAVR valve regurgitation. The aortic valve has been  repaired/replaced. Aortic valve regurgitation is mild.  No aortic stenosis  is present. There is a 26 mm Sapien prosthetic (TAVR) valve present in the  aortic position. Procedure Date: 12/23/19.  Aortic regurgitation PHT measures 833 msec. Aortic valve mean gradient  measures 14.0 mmHg. Aortic valve Vmax measures 2.56 m/s.   5. Aortic dilatation noted. There is moderate dilatation of the ascending  aorta, measuring 46 mm.   6. The inferior vena cava is normal in size with greater than 50%  respiratory variability, suggesting right atrial pressure of 3 mmHg.   Comparison(s): When compared to prior, there is a mild aortic  regurgitation signal.   TEE 07/03/2022  IMPRESSIONS     1. Left ventricular ejection fraction, by estimation, is 60 to 65%. The  left ventricle has normal function. The left ventricle has no regional  wall motion abnormalities.   2. Right ventricular systolic function is normal. The right ventricular  size is normal.   3. No left atrial/left atrial appendage thrombus was detected.   4. The mitral valve is normal in structure. No evidence of mitral valve  regurgitation. No evidence of mitral stenosis.   5. Post TAVR with 26 mm Sapien 3 turbulence and elevated systolic  gradient Moderate to severe central AR No PVL Valve may be under expanded  with V shape from annulus to root. Patient has some HALT/HAM as well on  CTA and leaflets appear thickened with  some systolic doming and restriction to motion . The aortic valve has been  repaired/replaced. Aortic valve regurgitation is not visualized. No aortic  stenosis is present.   6. The inferior vena cava is normal in size with greater than 50%  respiratory variability, suggesting right atrial pressure of 3 mmHg.   Conclusion(s)/Recommendation(s): Normal biventricular function without  evidence of hemodynamically significant valvular heart disease.   EKG:     SR rate 64 LAD 01/01/20   Recent Labs: 06/26/2023: BUN 22; Creat 1.23; Hemoglobin 14.5; Platelets 153; Potassium 4.8;  Sodium 142 09/11/2023: ALT 20  Recent Lipid Panel    Component Value Date/Time   CHOL 140 09/11/2023 1019   TRIG 139 09/11/2023 1019   HDL 55 09/11/2023 1019   CHOLHDL 2.5 09/11/2023 1019   CHOLHDL 3.7 CALC 12/25/2007 1636   VLDL 25 12/25/2007 1636   LDLCALC 61 09/11/2023 1019   LDLDIRECT 164.4 12/25/2007 1636    Physical Exam:    VS:  BP 120/68 (BP Location: Left Arm, Patient Position: Sitting, Cuff Size: Normal)   Pulse (!) 53   Ht 6' (1.829 m)   Wt 205 lb (93 kg)   SpO2 98%   BMI 27.80 kg/m     Wt Readings from Last 3 Encounters:  09/28/23 205 lb (93 kg)  06/26/23 200 lb 12.8 oz (91.1 kg)  03/12/23 202 lb 12.8 oz (92 kg)     Affect appropriate Healthy:  appears stated age HEENT: normal Neck supple with no adenopathy JVP normal no bruits no thyromegaly Lungs clear with no wheezing and good diaphragmatic motion Heart:  S1/S2 SEM/AR  murmur, no rub, gallop or click PMI normal Abdomen: benighn, BS positve, no tenderness, no AAA no bruit.  No HSM or HJR  no hematoma   Distal pulses intact with no bruits No edema Neuro  non-focal Skin warm and dry No muscular weakness    PLAN:    In order of problems listed above:  1.S/p TAVR: 12/23/19 with 26 mm Sapien 3 valve gradients  elevated with worsening central AR ? Deep deployment and under expansion not apparent on TTE/CT  HALT/HAM on CT and TEE 07/03/22 with moderate to severe central AR no PVL  Eliquis started 07/04/22 echo 09/27/22 and gradients much better TTE 09/28/2023 stable more mild/mod AR  2. HTN:  On Toprol ARB added f/u home monitoring normal   3. Aneursym: CT scan 06/23/22 4.4 cm ascending aortic root  History of bicuspid AV F/U CT June 2024 on beta blocker and ARB   4. CAD: Cath prior to TAVR 10/08/19 with 75% small D1 and 50% mid LAD. No chest pain. Continue medical therapy with ASA, statin and BB. CM note indicates D1 not suitable for PCI  Continue beta blocker, baby asa, statin Plavix d/c with history of  ICH  LDL at goal   5. HLD:  now on crestor repeat labs in 2 months   6. Brain Aneurysm:  Coil embolizaiton in [place] 10/31/22 Seen hat [facility] 11/19 for right groin hematoma Told to hold his eliquis further Hct stable 39.8 CT with no pseudo aneurysm.  Will f/u US of RFA/groin   7. Arthritis: f/u Dr August Saucer and Corliss Skains post injection left knee Trigger fingers in hands and chronic shoulder pain Allopurinol and Ultram    F/U in 6 months   Signed, Charlton Haws, MD  09/28/2023 9:26 AM    [facility]

## 2023-09-27 ENCOUNTER — Other Ambulatory Visit: Payer: Self-pay | Admitting: Cardiovascular Disease

## 2023-09-27 MED ORDER — APIXABAN 5 MG PO TABS: 5.0000 mg | ORAL_TABLET | Freq: Two times a day (BID) | ORAL | 1 refills | Status: DC

## 2023-09-27 NOTE — Addendum Note (Signed)
Addended by: Louanna Raw on: 09/27/2023 02:33 PM   Modules accepted: Orders

## 2023-09-27 NOTE — Telephone Encounter (Signed)
Prescription refill request for Eliquis received. Indication: TAVR Last office visit: 03/12/23  Burna Forts MD Scr: 1.23 on 06/26/23  Epic Age: 72 Weight: 92kg   Based on above findings Eliquis 5mg  twice daily is the appropriate dose.  Refill approved.

## 2023-09-28 ENCOUNTER — Ambulatory Visit (INDEPENDENT_AMBULATORY_CARE_PROVIDER_SITE_OTHER): Payer: Medicare Other | Admitting: Cardiovascular Disease

## 2023-09-28 ENCOUNTER — Ambulatory Visit (HOSPITAL_COMMUNITY): Payer: Medicare Other | Attending: Cardiovascular Disease

## 2023-09-28 VITALS — BP 120/68 | HR 53 | Ht 72.0 in | Wt 205.0 lb

## 2023-09-28 DIAGNOSIS — E782 Mixed hyperlipidemia: Secondary | ICD-10-CM | POA: Diagnosis not present

## 2023-09-28 DIAGNOSIS — I671 Cerebral aneurysm, nonruptured: Secondary | ICD-10-CM | POA: Insufficient documentation

## 2023-09-28 DIAGNOSIS — Z952 Presence of prosthetic heart valve: Secondary | ICD-10-CM | POA: Insufficient documentation

## 2023-09-28 DIAGNOSIS — E785 Hyperlipidemia, unspecified: Secondary | ICD-10-CM | POA: Insufficient documentation

## 2023-09-28 DIAGNOSIS — I340 Nonrheumatic mitral (valve) insufficiency: Secondary | ICD-10-CM

## 2023-09-28 LAB — ECHOCARDIOGRAM COMPLETE
AR max vel: 1.02 cm2
AV Area VTI: 1.01 cm2
AV Area mean vel: 1.06 cm2
AV Mean grad: 20.5 mm[Hg]
AV Peak grad: 37.2 mm[Hg]
Ao pk vel: 3.05 m/s
Area-P 1/2: 2.1 cm2
P 1/2 time: 563 ms
S' Lateral: 3.9 cm

## 2023-09-28 MED ORDER — ROSUVASTATIN CALCIUM 40 MG PO TABS: 40.0000 mg | ORAL_TABLET | Freq: Every day | ORAL | 3 refills | Status: DC

## 2023-09-28 MED ORDER — APIXABAN 5 MG PO TABS: 5.0000 mg | ORAL_TABLET | Freq: Two times a day (BID) | ORAL | 3 refills | Status: DC

## 2023-09-28 NOTE — Patient Instructions (Addendum)
Medication Instructions:  Your physician recommends that you continue on your current medications as directed. Please refer to the Current Medication list given to you today.  *If you need a refill on your cardiac medications before your next appointment, please call your pharmacy*  Lab Work: Your physician recommends that you return for lab work in:6 months for fasting lipid and liver panel  If you have labs (blood work) drawn today and your tests are completely normal, you will receive your results only by: MyChart Message (if you have MyChart) OR A paper copy in the mail If you have any lab test that is abnormal or we need to change your treatment, we will call you to review the results.  Testing/Procedures: None ordered today.  Follow-Up: At [facility], you and your health needs are our priority.  As part of our continuing mission to provide you with exceptional heart care, we have created designated Provider Care Teams.  These Care Teams include your primary Cardiologist (physician) and Advanced Practice Providers (APPs -  Physician Assistants and Nurse Practitioners) who all work together to provide you with the care you need, when you need it.  We recommend signing up for the patient portal called "MyChart".  Sign up information is provided on this After Visit Summary.  MyChart is used to connect with patients for Virtual Visits (Telemedicine).  Patients are able to view lab/test results, encounter notes, upcoming appointments, etc.  Non-urgent messages can be sent to your provider as well.   To learn more about what you can do with MyChart, go to [email or web address].    Your next appointment:   12 month(s)  Provider:   Charlton Haws, MD     Other Instructions

## 2023-10-04 ENCOUNTER — Other Ambulatory Visit: Payer: Self-pay | Admitting: Rheumatology

## 2023-10-04 NOTE — Telephone Encounter (Signed)
Last Fill: 07/06/2023  Labs: 06/26/2023 CBC and CMP are normal.  Uric acid is in the desirable range at 4.1   Next Visit: 12/27/2023  Last Visit: 06/26/2023  DX:  Idiopathic chronic gout of multiple sites without tophus   Current Dose per office note 06/26/2023:  allopurinol 300 mg p.o. daily   Okay to refill Allopurinol?

## 2023-11-12 ENCOUNTER — Encounter: Payer: Self-pay | Admitting: Orthopedic Surgery

## 2023-11-19 ENCOUNTER — Encounter: Payer: Self-pay | Admitting: Orthopedic Surgery

## 2023-11-19 DIAGNOSIS — M65962 Unspecified synovitis and tenosynovitis, left lower leg: Secondary | ICD-10-CM

## 2023-11-19 DIAGNOSIS — M19011 Primary osteoarthritis, right shoulder: Secondary | ICD-10-CM

## 2023-11-19 DIAGNOSIS — M545 Low back pain, unspecified: Secondary | ICD-10-CM

## 2023-11-20 NOTE — Telephone Encounter (Signed)
Yes thx 3 x week for 4 w

## 2023-12-04 NOTE — Therapy (Signed)
OUTPATIENT PHYSICAL THERAPY EVALUATION   Patient Name: Jose [name] MRN: [id number] DOB:11-21-1951, 72 y.o., male Today's Date: 12/05/2023  END OF SESSION:  PT End of Session - 12/05/23 1018     Visit Number 1    Number of Visits 20    Date for PT Re-Evaluation 02/13/24    PT Start Time 1018    PT Stop Time 1105    PT Time Calculation (min) 47 min    Activity Tolerance Patient tolerated treatment well    Behavior During Therapy WFL for tasks assessed/performed             Past Medical History:  Diagnosis Date   Arthritis    R shoulder, great toes, hands- has gout & has injections in knees with Dr. Corliss Skains    Bicuspid aortic valve with ascending aorta 4.0 to 4.5 cm in diameter    CAD (coronary artery disease)    Cancer (HCC)    skin ca-    GERD (gastroesophageal reflux disease)    Headache    History of hiatal hernia    Hypertension    Prostate cancer (HCC)    S/P TAVR (transcatheter aortic valve replacement)    SDH (subdural hematoma) (HCC)    Severe aortic stenosis    Thoracic ascending aortic aneurysm (HCC)    Vertebral artery aneurysm (HCC)    Past Surgical History:  Procedure Laterality Date   BRAIN SURGERY  2016   evacuation of SDH   CARDIAC CATHETERIZATION     CARPAL TUNNEL RELEASE Bilateral    embolization of left vertebral artery aneurysm with pipeline/coil  10/31/2022   GREEN LIGHT LASER TURP (TRANSURETHRAL RESECTION OF PROSTATE  04/27/2017   KNEE ARTHROPLASTY     LEFT HEART CATH AND CORONARY ANGIOGRAPHY N/A 10/08/2019   Procedure: LEFT HEART CATH AND CORONARY ANGIOGRAPHY;  Surgeon: Tonny Bollman, MD;  Location: [facility] INVASIVE CV LAB;  Service: Cardiovascular;  Laterality: N/A;   QUADRICEPS TENDON REPAIR Bilateral 06/15/2017   Procedure: REPAIR QUADRICEP TENDON;  Surgeon: Cammy Copa, MD;  Location: [facility] OR;  Service: Orthopedics;  Laterality: Bilateral;   SHOULDER SURGERY Right    TEE WITHOUT CARDIOVERSION N/A 07/03/2022   Procedure:  TRANSESOPHAGEAL ECHOCARDIOGRAM (TEE);  Surgeon: Wendall Stade, MD;  Location: [facility] ENDOSCOPY;  Service: Cardiovascular;  Laterality: N/A;   TRANSCATHETER AORTIC VALVE REPLACEMENT, TRANSFEMORAL  12/23/2019   TRANSCATHETER AORTIC VALVE REPLACEMENT, TRANSFEMORAL N/A 12/23/2019   Procedure: TRANSCATHETER AORTIC VALVE REPLACEMENT, TRANSFEMORAL;  Surgeon: Tonny Bollman, MD;  Location: [facility] OR;  Service: Open Heart Surgery;  Laterality: N/A;   VASCULAR SURGERY     Patient Active Problem List   Diagnosis Date Noted   Hypertension    Severe aortic stenosis 10/08/2019   Candidiasis    Abnormality of gait    Hypoalbuminemia due to protein-calorie malnutrition [facility])    History of gout    Sleep disturbance    Constipation    Rupture of quadriceps tendon, right, sequela 06/19/2017   Quadriceps tendon rupture, left, sequela 06/19/2017   Acute blood loss anemia 06/19/2017   Fall    History of subdural hematoma    Post-operative pain    Recurrent falls    Quadriceps tendon rupture 06/15/2017   Primary osteoarthritis of both knees 02/28/2017   Primary osteoarthritis of both hands 02/28/2017   Uricacidemia 02/28/2017   Pain in joint of left knee 02/07/2017   Idiopathic chronic gout, unspecified site, without tophus (tophi) 11/29/2016   HEMATURIA UNSPECIFIED 01/25/2009   Abdominal  pain 01/25/2009   XERODERMA 03/10/2008   UNS ADVRS EFF UNS RX MEDICINAL&BIOLOGICAL SBSTNC 03/10/2008   ADJUSTMENT REACTION WITH PHYSICAL SYMPTOMS 02/14/2008   MUSCLE SPASM 02/14/2008   ACUTE PROSTATITIS 12/16/2007   BREAST LUMP OR MASS, RIGHT 07/12/2007   H/O: RCT (rotator cuff tear) 01/13/2007   GOUT 01/08/2007    PCP: Earleen Reaper, MD   REFERRING PROVIDER: Cammy Copa, MD   REFERRING DIAG:  [id number] (ICD-10-CM) - Synovitis of left knee  M19.011 (ICD-10-CM) - Primary osteoarthritis, right shoulder  M54.50 (ICD-10-CM) - Low back pain, unspecified back pain laterality, unspecified chronicity,  unspecified whether sciatica present    THERAPY DIAG:  Chronic right shoulder pain  Chronic pain of left knee  Other low back pain  Muscle weakness (generalized)  Rationale for Evaluation and Treatment: Rehabilitation  ONSET DATE: see subjective, on-going for years  SUBJECTIVE:    SUBJECTIVE STATEMENT: Pt stating he has been painting a lot at home and feels it may have exacerbated his Rt shoulder. Pt reporting radiation pain down his arm but no numbness/tingling. Pt stating Rt shoulder is giving him the most problem. Pt stating after getting the second shingles shot his shoulder pain increased into tricep and shoulder. Pt stating pain has been ongoing for about a year. Pt stating pain is worse at night.  Pt stating left knee pain has been ongoing since January 2018 when he was skiing and twisted his knee and he tore his quadriceps tendon. Pt stating he fell and when he fell he tore both tendons in bil LE. Pt then stating the repair didn't take on his left LE and it was repaired again. Pt stating he had PT and he gained ROM back in bil LE.  Pt reporting falling while in hospital during a bed transfer where he landed on his floor on his back in 2018. Pt stating his back has bothered him on/off ever since.  Pt stating he hasn't been able to work out over the past few months due to recent move into [place].    PERTINENT HISTORY: HTN, severe aortic stenosis, abnormality of gait, h/o gout, rupture of quadriceps tendon on Rt 2018 on left 2018, subdural hematoma 2016, h/o falls, primary OA in bil hands Polio on Left side affecting Left UE strength   PAIN:  NPRS scale: Rt shoulder: 8/10, Left knee: 2/10, low back: 6-7/10 Pain description: achy, throbbing,  Aggravating factors: resting, tylenol, voltaren Relieving factors: certain movement  PRECAUTIONS: None  WEIGHT BEARING RESTRICTIONS: No  FALLS:  Has patient fallen in last 6 months? No falls in last few years. Haven't fell  since he tore his quad tendons and during a hospital transfer several years ago  LIVING ENVIRONMENT: Lives with: lives with their family and lives with an adult companion Lives in: House/apartment Stairs: Yes: Internal: 17 steps; on left going up and External: 2 steps; on left going up Has following equipment at home: None  OCCUPATION: retired  PLOF: Independent  PATIENT GOALS: Be able to work out at gym with less pain  Next MD visit:   OBJECTIVE:   DIAGNOSTIC FINDINGS: Shoulder: 08/06/23  3 views of the right shoulder including Grashey, scapular Y and axial view  were ordered and reviewed by myself.  X-rays show severe osteoarthritis of  the right glenohumeral joint.  There is a large inferior spur off the  humeral head.  There is some chondrocalcinosis superior to the humeral  head, likely indicative of pseudogout or prior gout.  No acute bony  fracture noted.  There is bony sclerosis noted of the humeral head and  inferior acetabulum from likely arthritic change.   Knee 05/22/23:   1. Postsurgical changes of quadriceps tendon repair. The tendon appears intact. Resolution of the prior small fluid bright signal within the midsubstance of the quadriceps tendon insertion on prior 10/01/2018 MRI. 2. Stable chronic oblique tear of the anterior horn of the lateral meniscus. Mild degenerative undersurface fraying of the posterior horn of the lateral meniscus. 3. Interval worsening of moderate to severe patellofemoral cartilage degenerative changes. 4. Mildly worsened cartilage loss within the anteromedial aspect of the weight-bearing lateral femoral condyle and adjacent lateral tibial plateau. 5. Unchanged high-grade partial to full-thickness cartilage loss within the weight-bearing medial femoral condyle.  Lumbar: 05/22/23:  AP lateral radiographs lumbar spine reviewed.  Grade 1-2 spondylolisthesis present at L5-S1 with pars fracture noted at the corresponding level.   Mild to  moderate degenerative changes present in the upper and and middle lumbar spine region.  Visualized hips without arthritis    PATIENT SURVEYS:  12/05/23: FOTO intake:  49%  predicted:  51%  COGNITION: Overall cognitive status: WFL    SENSATION: WFL   POSTURE:  rounded shoulders, forward head, and decreased lumbar lordosis  PALPATION: TTP: distal left quad tendon, Rt upper trap and levator and anterior shoulder  MMT   Right 12/04/23 Left 12/04/23  Hip flexion 5 5  Hip extension 5 5  Hip abduction 5 5  Hip adduction 5 5  Hip internal rotation    Hip external rotation    Knee flexion 5 5  Knee extension 5 5                  SHOULDER     Shoulder flexion 4 3  Shoulder abduction 4 3  Shoulder ER 4- 3  Shoulder IR 4- 3   (Blank rows = not tested)    ROM  LE Right 12/04/23 Left 12/04/23  Hip flexion 108 110  Hip extension    Hip abduction    Hip adduction    Hip internal rotation    Hip external rotation    Knee flexion 134 126  Knee extension 0 0   SHOULDER:      Shoulder flexion 138 158  Shoulder abduction 125 140  Shoulder ER 30 60   (Blank rows = not tested)    FUNCTIONAL TESTS:  12/04/23 : 5 time sit to stand: 17.2 seconds no UE support  GAIT: Distance walked: clinic distances, level surface Assistive device utilized: None Level of assistance: Complete Independence Comments: mild forward flexed trunk posture  TODAY'S TREATMENT                                                                          DATE:12/04/23 Therex: HEP instruction/performance c cues for techniques, handout provided.  Trial set performed of each for comprehension and symptom assessment.  See below for exercise list  PATIENT EDUCATION:  Education details: HEP, POC Person educated: Patient Education  method: Explanation, Demonstration, Verbal cues, and Handouts Education comprehension: verbalized understanding, returned demonstration, and verbal cues required  HOME EXERCISE PROGRAM: Access Code: [email or web address] URL: [email or web address] Date: 12/05/2023 Prepared by: Narda Amber  Exercises - Standing Shoulder Row with Anchored Resistance  - 1 x daily - 7 x weekly - 3 sets - 10 reps - Supine Bridge  - 2 x daily - 7 x weekly - 2 sets - 10 reps - 5 seconds hold - Supine Shoulder External Rotation in 45 Degrees Abduction AAROM with Dowel  - 1 x daily - 7 x weekly - 3 sets - 10 reps - Supine Shoulder Flexion Extension AAROM with Dowel  - 2-3 x daily - 7 x weekly - 2 sets - 10 reps - Seated Small Alternating Straight Leg Lifts with Heel Touch  - 2 x daily - 7 x weekly - 15-20 reps - Recumbent Bike  - 2-3 x daily - 7 x weekly  ASSESSMENT:  CLINICAL IMPRESSION: Patient is a 72 y.o. who comes to clinic with complaints of Rt shoulder, left knee and low back pain with mobility, strength and movement coordination deficits that impair their ability to perform usual daily and recreational functional activities without increase difficulty/symptoms at this time.  Pt presenting with significant PMH including left UE weakness due to Polio. Patient to benefit from skilled PT services to address impairments and limitations to improve to previous level of function without restriction secondary to condition.   OBJECTIVE IMPAIRMENTS: difficulty walking, decreased ROM, decreased strength, increased edema, impaired flexibility, impaired UE functional use, postural dysfunction, and pain.   ACTIVITY LIMITATIONS: lifting, bending, standing, squatting, sleeping, stairs, transfers, reach over head, and hygiene/grooming  PARTICIPATION LIMITATIONS: cleaning, community activity, and yard work  PERSONAL FACTORS: 3+ comorbidities: see pertinent history  are also affecting patient's functional outcome.    REHAB POTENTIAL: Good  CLINICAL DECISION MAKING: Evolving/moderate complexity  EVALUATION COMPLEXITY: High   GOALS: Goals reviewed with patient? Yes  SHORT TERM GOALS: (target date for Short term goals are 3 weeks 12/25/2023)   1.  Patient will demonstrate independent use of home exercise program to maintain progress from in clinic treatments.  Goal status: New  2. Pt to improve his 5 time sit to stand to </=  13  seconds with UE support.   Goal Status: New   LONG TERM GOALS: (target dates for all long term goals are 10 weeks  02/12/2024 )   1. Patient will demonstrate/report pain at worst less than or equal to 2/10 to facilitate minimal limitation in daily activity secondary to pain symptoms.  Goal status: New   2. Patient will demonstrate independent use of home exercise program to facilitate ability to maintain/progress functional gains from skilled physical therapy services.  Goal status: New   3. Patient will demonstrate FOTO outcome > or =  51 % to indicate reduced disability due to condition.  Goal status: New   4.  Patient will be able to perform up and down 1 flight of stairs with reciprocal gait pattern with left knee pain and low back pain </= 2/10.   Goal status: New   5.  Patient will demonstrate improved Rt UE flexion to >/= 150 degrees in order to improve functional mobility.  Goal status: New   6.  Pt will improve Rt shoulder ER to >/= 60 deg in order to improve functional mobility.  Goal status: New      PLAN:  PT FREQUENCY: 1-2x/week  PT DURATION: 10 weeks  PLANNED INTERVENTIONS: Can include 16109- PT Re-evaluation, 97110-Therapeutic exercises, 97530- Therapeutic activity, [id number]- Neuromuscular re-education, 97535- Self Care, 97140- Manual therapy, [phone]- Gait training, [phone]- Orthotic Fit/training, [phone]- Canalith repositioning, [id number]- Aquatic Therapy, 97014- Electrical stimulation (unattended), [id number]- Electrical stimulation (manual), [id number]-  Vasopneumatic device, [id number]- Ultrasound, [id number]- Traction (mechanical), [id number]- Ionotophoresis 4mg /ml Dexamethasone, Patient/Family education, Balance training, Stair training, Taping, Dry Needling, Joint mobilization, Joint manipulation, Spinal manipulation, Spinal mobilization, Scar mobilization, Vestibular training, Visual/preceptual remediation/compensation, DME instructions, Cryotherapy, and Moist heat.  All performed as medically necessary.  All included unless contraindicated  PLAN FOR NEXT SESSION: Review HEP knowledge/results, shoulder ROM, overall strengthening of UEs and LEs Use HHD to test LE strength for comparison       Sharmon Leyden, PT, MPT 12/05/2023, 11:50 AM

## 2023-12-05 ENCOUNTER — Ambulatory Visit (INDEPENDENT_AMBULATORY_CARE_PROVIDER_SITE_OTHER): Payer: Medicare Other | Admitting: Physical Therapy

## 2023-12-05 ENCOUNTER — Encounter: Payer: Self-pay | Admitting: Physical Therapy

## 2023-12-05 ENCOUNTER — Other Ambulatory Visit: Payer: Self-pay | Admitting: Cardiovascular Disease

## 2023-12-05 DIAGNOSIS — M5459 Other low back pain: Secondary | ICD-10-CM

## 2023-12-05 DIAGNOSIS — R296 Repeated falls: Secondary | ICD-10-CM

## 2023-12-05 DIAGNOSIS — M6281 Muscle weakness (generalized): Secondary | ICD-10-CM | POA: Diagnosis not present

## 2023-12-05 DIAGNOSIS — M25562 Pain in left knee: Secondary | ICD-10-CM | POA: Diagnosis not present

## 2023-12-05 DIAGNOSIS — G8929 Other chronic pain: Secondary | ICD-10-CM

## 2023-12-05 DIAGNOSIS — M25511 Pain in right shoulder: Secondary | ICD-10-CM | POA: Diagnosis not present

## 2023-12-14 NOTE — Progress Notes (Signed)
 Office Visit Note  Patient: Jose [name]             Date of Birth: [birth year]           MRN: [id number]             PCP: Birda Ahumada, MD Referring: Birda Ahumada, MD Visit Date: 12/27/2023 Occupation: @GUAROCC @  Subjective:  Medication management  History of Present Illness: Jose [name] is a 72 y.o. male with gouty arthropathy and osteoarthritis.  He returns today after his last visit in July 2024.  He states he gets mild twinges of discomfort in his toe after consuming wine.  He has not had a gout flare.  He takes allopurinol  300 mg p.o. daily.  He did not have to take any colchicine .  He continues to have limited range of motion and discomfort in his shoulders.  He states he does not want to undergo total shoulder replacement.  He had right shoulder joint injection by Dr. Burnetta in August which was helpful.  He is also had left knee joint injection by Dr. Addie last year which was helpful.  Currently does not have much discomfort in his joints.   Activities of Daily Living:  Patient reports morning stiffness for 10-15 minutes.   Patient Reports nocturnal pain.  Difficulty dressing/grooming: Denies Difficulty climbing stairs: Reports Difficulty getting out of chair: Denies Difficulty using hands for taps, buttons, cutlery, and/or writing: Denies  Review of Systems  Constitutional:  Negative for fatigue.  HENT:  Negative for mouth sores and mouth dryness.   Eyes:  Negative for dryness.  Respiratory:  Negative for shortness of breath.   Cardiovascular:  Negative for chest pain and palpitations.  Gastrointestinal:  Negative for blood in stool, constipation and diarrhea.  Endocrine: Negative for increased urination.  Genitourinary:  Negative for involuntary urination.  Musculoskeletal:  Positive for joint pain, joint pain, joint swelling, myalgias, morning stiffness, muscle tenderness and myalgias. Negative for gait problem and muscle weakness.  Skin:  Negative for color  change, rash, hair loss and sensitivity to sunlight.  Allergic/Immunologic: Negative for susceptible to infections.  Neurological:  Negative for dizziness and headaches.  Hematological:  Negative for swollen glands.  Psychiatric/Behavioral:  Negative for depressed mood and sleep disturbance. The patient is not nervous/anxious.     PMFS History:  Patient Active Problem List   Diagnosis Date Noted   Hypertension    Severe aortic stenosis 10/08/2019   Candidiasis    Abnormality of gait    Hypoalbuminemia due to protein-calorie malnutrition [facility])    History of gout    Sleep disturbance    Constipation    Rupture of quadriceps tendon, right, sequela 06/19/2017   Quadriceps tendon rupture, left, sequela 06/19/2017   Acute blood loss anemia 06/19/2017   Fall    History of subdural hematoma    Post-operative pain    Recurrent falls    Quadriceps tendon rupture 06/15/2017   Primary osteoarthritis of both knees 02/28/2017   Primary osteoarthritis of both hands 02/28/2017   Uricacidemia 02/28/2017   Pain in joint of left knee 02/07/2017   Idiopathic chronic gout, unspecified site, without tophus (tophi) 11/29/2016   HEMATURIA UNSPECIFIED 01/25/2009   Abdominal pain 01/25/2009   XERODERMA 03/10/2008   UNS ADVRS EFF UNS RX MEDICINAL&BIOLOGICAL SBSTNC 03/10/2008   ADJUSTMENT REACTION WITH PHYSICAL SYMPTOMS 02/14/2008   MUSCLE SPASM 02/14/2008   ACUTE PROSTATITIS 12/16/2007   BREAST LUMP OR MASS, RIGHT 07/12/2007   H/O: RCT (rotator  cuff tear) 01/13/2007   GOUT 01/08/2007    Past Medical History:  Diagnosis Date   Arthritis    R shoulder, great toes, hands- has gout & has injections in knees with Dr. [name]    Bicuspid aortic valve with ascending aorta 4.0 to 4.5 cm in diameter    CAD (coronary artery disease)    Cancer (HCC)    skin ca-    GERD (gastroesophageal reflux disease)    Headache    History of hiatal hernia    Hypertension    Prostate cancer (HCC)    S/P TAVR  (transcatheter aortic valve replacement)    SDH (subdural hematoma) (HCC)    Severe aortic stenosis    Thoracic ascending aortic aneurysm (HCC)    Vertebral artery aneurysm (HCC)     Family History  Problem Relation Age of Onset   Parkinson's disease Mother    Heart disease Father    Parkinson's disease Brother    Diabetes Brother    Past Surgical History:  Procedure Laterality Date   BRAIN SURGERY  2016   evacuation of SDH   CARDIAC CATHETERIZATION     CARPAL TUNNEL RELEASE Bilateral    embolization of left vertebral artery aneurysm with pipeline/coil  10/31/2022   GREEN LIGHT LASER TURP (TRANSURETHRAL RESECTION OF PROSTATE  04/27/2017   KNEE ARTHROPLASTY     LEFT HEART CATH AND CORONARY ANGIOGRAPHY N/A 10/08/2019   Procedure: LEFT HEART CATH AND CORONARY ANGIOGRAPHY;  Surgeon: Wonda Sharper, MD;  Location: [facility] INVASIVE CV LAB;  Service: Cardiovascular;  Laterality: N/A;   QUADRICEPS TENDON REPAIR Bilateral 06/15/2017   Procedure: REPAIR QUADRICEP TENDON;  Surgeon: Addie Cordella Hamilton, MD;  Location: [facility] OR;  Service: Orthopedics;  Laterality: Bilateral;   SHOULDER SURGERY Right    TEE WITHOUT CARDIOVERSION N/A 07/03/2022   Procedure: TRANSESOPHAGEAL ECHOCARDIOGRAM (TEE);  Surgeon: Delford Maude BROCKS, MD;  Location: [facility] ENDOSCOPY;  Service: Cardiovascular;  Laterality: N/A;   TRANSCATHETER AORTIC VALVE REPLACEMENT, TRANSFEMORAL  12/23/2019   TRANSCATHETER AORTIC VALVE REPLACEMENT, TRANSFEMORAL N/A 12/23/2019   Procedure: TRANSCATHETER AORTIC VALVE REPLACEMENT, TRANSFEMORAL;  Surgeon: Wonda Sharper, MD;  Location: [facility] OR;  Service: Open Heart Surgery;  Laterality: N/A;   VASCULAR SURGERY     Social History   Social History Narrative   Not on file   Immunization History  Administered Date(s) Administered   PFIZER(Purple Top)SARS-COV-2 Vaccination 02/19/2020, 03/17/2020, 10/25/2020, 08/30/2021   Pfizer Fall 2023 Covid-19 Vaccine 23yrs thru 41yrs. 10/04/2022      Objective: Vital Signs: BP 108/73 (BP Location: Left Arm, Patient Position: Sitting, Cuff Size: Normal)   Pulse 61   Resp 14   Ht 6' (1.829 m)   Wt 206 lb (93.4 kg)   BMI 27.94 kg/m    Physical Exam Vitals and nursing note reviewed.  Constitutional:      Appearance: He is well-developed.  HENT:     Head: Normocephalic and atraumatic.  Eyes:     Conjunctiva/sclera: Conjunctivae normal.     Pupils: Pupils are equal, round, and reactive to light.  Cardiovascular:     Rate and Rhythm: Normal rate and regular rhythm.     Heart sounds: Normal heart sounds.  Pulmonary:     Effort: Pulmonary effort is normal.     Breath sounds: Normal breath sounds.  Abdominal:     General: Bowel sounds are normal.     Palpations: Abdomen is soft.  Musculoskeletal:     Cervical back: Normal range of motion and neck supple.  Skin:    General: Skin is warm and dry.     Capillary Refill: Capillary refill takes less than 2 seconds.  Neurological:     Mental Status: He is alert and oriented to person, place, and time.  Psychiatric:        Behavior: Behavior normal.      Musculoskeletal Exam: Cervical spine was in good range of motion.  He had no tenderness over thoracic or lumbar spine.  He had limited range of motion of bilateral shoulders to about 30 degrees abduction and forward flexion.  He had limited internal rotation.  Elbow joints and wrist joints in good range of motion.  Bilateral PIP and DIP thickening was noted.  He had no trigger finger on the examination today.  Hip joints and knee joints in good range of motion without any warmth swelling or effusion.  There was no tenderness over ankles or MTPs.  CDAI Exam: CDAI Score: -- Patient Global: --; Provider Global: -- Swollen: --; Tender: -- Joint Exam 12/27/2023   No joint exam has been documented for this visit   There is currently no information documented on the homunculus. Go to the Rheumatology activity and complete the  homunculus joint exam.  Investigation: No additional findings.  Imaging: No results found.  Recent Labs: Lab Results  Component Value Date   WBC 10.4 06/26/2023   HGB 14.5 06/26/2023   PLT 153 06/26/2023   NA 142 06/26/2023   K 4.8 06/26/2023   CL 105 06/26/2023   CO2 29 06/26/2023   GLUCOSE 85 06/26/2023   BUN 22 06/26/2023   CREATININE 1.23 06/26/2023   BILITOT 0.4 09/11/2023   ALKPHOS 72 09/11/2023   AST 24 09/11/2023   ALT 20 09/11/2023   PROT 6.2 09/11/2023   ALBUMIN 4.2 09/11/2023   CALCIUM  9.3 06/26/2023   GFRAA 70 05/05/2021    Speciality Comments: No specialty comments available.  Procedures:  No procedures performed Allergies: Ciprofloxacin and Quinolones   Assessment / Plan:     Visit Diagnoses: Idiopathic chronic gout of multiple sites without tophus - June 26, 2023 uric acid 4.1 -patient denies having a flare of gout.  He drinks wine and feels occasional twinge in his toe.  Dietary modifications were discussed.  Plan: Uric acid  Medication monitoring encounter - Allopurinol  300 mg p.o. daily.  September 11, 2023 LFTs normal.  CBC and CMP were normal in July. - Plan: CBC with Differential/Platelet, COMPLETE METABOLIC PANEL WITH GFR  Chronic right shoulder pain - He had right glenohumeral joint injection by Dr. Lonell Sprang on August 06, 2023 for severe osteoarthritis.  Patient is not want to have any further surgeries.  Chronic left shoulder pain-he has limited range of motion of the left shoulder with chronic discomfort.  He does not want total shoulder replacement.  Primary osteoarthritis of both hands-he is also PIP and DIP thickening.  No synovitis was noted.  Trigger finger, right ring finger - He had 2 injections and A1 pulley and 1 injection in A3 pulley. right ring finger A1 pulley region was injected 12/23.  Resolved.  Trigger little finger of right hand-resolved  Primary osteoarthritis of both knees - Left knee joint injected by Dr. Addie in July  2024.  Doing well currently.  Quadriceps tendon rupture, left, sequela  Primary osteoarthritis of both feet-he denies any discomfort.  Neuropathy  Subdural hematoma (HCC)  H/O aortic valve replacement  [place]  Orders: Orders Placed This Encounter  Procedures   CBC with  Differential/Platelet   COMPLETE METABOLIC PANEL WITH GFR   Uric acid   No orders of the defined types were placed in this encounter.    Follow-Up Instructions: Return in about 6 months (around 06/25/2024) for Gout, Osteoarthritis.   Maya Nash, MD  Note - This record has been created using Animal nutritionist.  Chart creation errors have been sought, but may not always  have been located. Such creation errors do not reflect on  the standard of medical care.

## 2023-12-18 ENCOUNTER — Ambulatory Visit (INDEPENDENT_AMBULATORY_CARE_PROVIDER_SITE_OTHER): Payer: Medicare Other | Admitting: Physical Therapy

## 2023-12-18 ENCOUNTER — Encounter: Payer: Self-pay | Admitting: Physical Therapy

## 2023-12-18 DIAGNOSIS — M5459 Other low back pain: Secondary | ICD-10-CM | POA: Diagnosis not present

## 2023-12-18 DIAGNOSIS — M25511 Pain in right shoulder: Secondary | ICD-10-CM

## 2023-12-18 DIAGNOSIS — M6281 Muscle weakness (generalized): Secondary | ICD-10-CM

## 2023-12-18 DIAGNOSIS — M25562 Pain in left knee: Secondary | ICD-10-CM

## 2023-12-18 DIAGNOSIS — G8929 Other chronic pain: Secondary | ICD-10-CM

## 2023-12-18 NOTE — Therapy (Signed)
 OUTPATIENT PHYSICAL THERAPY EVALUATION   Patient Name: Jose [name] MRN: [id number] DOB:[birth year], 72 y.o., male Today's Date: 12/18/2023  END OF SESSION:  PT End of Session - 12/18/23 1049     Visit Number 2    Number of Visits 20    Date for PT Re-Evaluation 02/13/24    PT Start Time 1100    PT Stop Time 1140    PT Time Calculation (min) 40 min    Activity Tolerance Patient tolerated treatment well    Behavior During Therapy WFL for tasks assessed/performed             Past Medical History:  Diagnosis Date   Arthritis    R shoulder, great toes, hands- has gout & has injections in knees with Dr. [name]    Bicuspid aortic valve with ascending aorta 4.0 to 4.5 cm in diameter    CAD (coronary artery disease)    Cancer (HCC)    skin ca-    GERD (gastroesophageal reflux disease)    Headache    History of hiatal hernia    Hypertension    Prostate cancer (HCC)    S/P TAVR (transcatheter aortic valve replacement)    SDH (subdural hematoma) (HCC)    Severe aortic stenosis    Thoracic ascending aortic aneurysm (HCC)    Vertebral artery aneurysm (HCC)    Past Surgical History:  Procedure Laterality Date   BRAIN SURGERY  2016   evacuation of SDH   CARDIAC CATHETERIZATION     CARPAL TUNNEL RELEASE Bilateral    embolization of left vertebral artery aneurysm with pipeline/coil  10/31/2022   GREEN LIGHT LASER TURP (TRANSURETHRAL RESECTION OF PROSTATE  04/27/2017   KNEE ARTHROPLASTY     LEFT HEART CATH AND CORONARY ANGIOGRAPHY N/A 10/08/2019   Procedure: LEFT HEART CATH AND CORONARY ANGIOGRAPHY;  Surgeon: Wonda Sharper, MD;  Location: [facility] INVASIVE CV LAB;  Service: Cardiovascular;  Laterality: N/A;   QUADRICEPS TENDON REPAIR Bilateral 06/15/2017   Procedure: REPAIR QUADRICEP TENDON;  Surgeon: Addie Cordella Glendia, MD;  Location: [facility] OR;  Service: Orthopedics;  Laterality: Bilateral;   SHOULDER SURGERY Right    TEE WITHOUT CARDIOVERSION N/A 07/03/2022   Procedure:  TRANSESOPHAGEAL ECHOCARDIOGRAM (TEE);  Surgeon: Delford Maude BROCKS, MD;  Location: [facility] ENDOSCOPY;  Service: Cardiovascular;  Laterality: N/A;   TRANSCATHETER AORTIC VALVE REPLACEMENT, TRANSFEMORAL  12/23/2019   TRANSCATHETER AORTIC VALVE REPLACEMENT, TRANSFEMORAL N/A 12/23/2019   Procedure: TRANSCATHETER AORTIC VALVE REPLACEMENT, TRANSFEMORAL;  Surgeon: Wonda Sharper, MD;  Location: [facility] OR;  Service: Open Heart Surgery;  Laterality: N/A;   VASCULAR SURGERY     Patient Active Problem List   Diagnosis Date Noted   Hypertension    Severe aortic stenosis 10/08/2019   Candidiasis    Abnormality of gait    Hypoalbuminemia due to protein-calorie malnutrition [facility])    History of gout    Sleep disturbance    Constipation    Rupture of quadriceps tendon, right, sequela 06/19/2017   Quadriceps tendon rupture, left, sequela 06/19/2017   Acute blood loss anemia 06/19/2017   Fall    History of subdural hematoma    Post-operative pain    Recurrent falls    Quadriceps tendon rupture 06/15/2017   Primary osteoarthritis of both knees 02/28/2017   Primary osteoarthritis of both hands 02/28/2017   Uricacidemia 02/28/2017   Pain in joint of left knee 02/07/2017   Idiopathic chronic gout, unspecified site, without tophus (tophi) 11/29/2016   HEMATURIA UNSPECIFIED 01/25/2009   Abdominal  pain 01/25/2009   XERODERMA 03/10/2008   UNS ADVRS EFF UNS RX MEDICINAL&BIOLOGICAL SBSTNC 03/10/2008   ADJUSTMENT REACTION WITH PHYSICAL SYMPTOMS 02/14/2008   MUSCLE SPASM 02/14/2008   ACUTE PROSTATITIS 12/16/2007   BREAST LUMP OR MASS, RIGHT 07/12/2007   H/O: RCT (rotator cuff tear) 01/13/2007   GOUT 01/08/2007    PCP: Birda Ahumada, MD   REFERRING PROVIDER: Addie Cordella Hamilton, MD   REFERRING DIAG:  [phone] (ICD-10-CM) - Synovitis of left knee  M19.011 (ICD-10-CM) - Primary osteoarthritis, right shoulder  M54.50 (ICD-10-CM) - Low back pain, unspecified back pain laterality, unspecified chronicity,  unspecified whether sciatica present    THERAPY DIAG:  Chronic right shoulder pain  Chronic pain of left knee  Other low back pain  Muscle weakness (generalized)  Rationale for Evaluation and Treatment: Rehabilitation  ONSET DATE: see subjective, on-going for years  SUBJECTIVE:    SUBJECTIVE STATEMENT: Pt stating no pain upon arrival. Pt reporting last night his Rt shoulder pain was 13/10. Pt stating his wife got him a heating sleeve which seems to help.    EVAL:  Pt stating he has been painting a lot at home and feels it may have exacerbated his Rt shoulder. Pt reporting radiation pain down his arm but no numbness/tingling. Pt stating Rt shoulder is giving him the most problem. Pt stating after getting the second shingles shot his shoulder pain increased into tricep and shoulder. Pt stating pain has been ongoing for about a year. Pt stating pain is worse at night.  Pt stating left knee pain has been ongoing since January 2018 when he was skiing and twisted his knee and he tore his quadriceps tendon. Pt stating he fell and when he fell he tore both tendons in bil LE. Pt then stating the repair didn't take on his left LE and it was repaired again. Pt stating he had PT and he gained ROM back in bil LE.  Pt reporting falling while in hospital during a bed transfer where he landed on his floor on his back in 2018. Pt stating his back has bothered him on/off ever since.  Pt stating he hasn't been able to work out over the past few months due to recent move into [place].    PERTINENT HISTORY: HTN, severe aortic stenosis, abnormality of gait, h/o gout, rupture of quadriceps tendon on Rt 2018 on left 2018, subdural hematoma 2016, h/o falls, primary OA in bil hands Polio on Left side affecting Left UE strength   PAIN:  NPRS scale: Rt shoulder:no pain upon arrival, 13/10 last night Pain description: achy, throbbing Aggravating factors: resting, tylenol , voltaren  Relieving factors:  certain movement  PRECAUTIONS: None  WEIGHT BEARING RESTRICTIONS: No  FALLS:  Has patient fallen in last 6 months? No falls in last few years. Haven't fell since he tore his quad tendons and during a hospital transfer several years ago  LIVING ENVIRONMENT: Lives with: lives with their family and lives with an adult companion Lives in: House/apartment Stairs: Yes: Internal: 17 steps; on left going up and External: 2 steps; on left going up Has following equipment at home: None  OCCUPATION: retired  PLOF: Independent  PATIENT GOALS: Be able to work out at gym with less pain  Next MD visit:   OBJECTIVE:   DIAGNOSTIC FINDINGS: Shoulder: 08/06/23  3 views of the right shoulder including Grashey, scapular Y and axial view  were ordered and reviewed by myself.  X-rays show severe osteoarthritis of  the right glenohumeral joint.  There  is a large inferior spur off the  humeral head.  There is some chondrocalcinosis superior to the humeral  head, likely indicative of pseudogout or prior gout.  No acute bony  fracture noted.  There is bony sclerosis noted of the humeral head and  inferior acetabulum from likely arthritic change.   Knee 05/22/23:   1. Postsurgical changes of quadriceps tendon repair. The tendon appears intact. Resolution of the prior small fluid bright signal within the midsubstance of the quadriceps tendon insertion on prior 10/01/2018 MRI. 2. Stable chronic oblique tear of the anterior horn of the lateral meniscus. Mild degenerative undersurface fraying of the posterior horn of the lateral meniscus. 3. Interval worsening of moderate to severe patellofemoral cartilage degenerative changes. 4. Mildly worsened cartilage loss within the anteromedial aspect of the weight-bearing lateral femoral condyle and adjacent lateral tibial plateau. 5. Unchanged high-grade partial to full-thickness cartilage loss within the weight-bearing medial femoral condyle.  Lumbar:  05/22/23:  AP lateral radiographs lumbar spine reviewed.  Grade 1-2 spondylolisthesis present at L5-S1 with pars fracture noted at the corresponding level.   Mild to moderate degenerative changes present in the upper and and middle lumbar spine region.  Visualized hips without arthritis    PATIENT SURVEYS:  12/05/23: FOTO intake:  49%  predicted:  51%  COGNITION: Overall cognitive status: WFL    SENSATION: WFL   POSTURE:  rounded shoulders, forward head, and decreased lumbar lordosis  PALPATION: TTP: distal left quad tendon, Rt upper trap and levator and anterior shoulder  MMT   Right 12/04/23 Left 12/04/23 Rt  / Left 12/18/23 HHD ppsi  Hip flexion 5 5   Hip extension 5 5   Hip abduction 5 5   Hip adduction 5 5   Hip internal rotation     Hip external rotation     Knee flexion 5 5 52.5 / 40.0  Knee extension 5 5 54.4 / 65.5                   SHOULDER      Shoulder flexion 4 3   Shoulder abduction 4 3   Shoulder ER 4- 3   Shoulder IR 4- 3    (Blank rows = not tested)    ROM  LE Right 12/04/23 Left 12/04/23  Hip flexion 108 110  Hip extension    Hip abduction    Hip adduction    Hip internal rotation    Hip external rotation    Knee flexion 134 126  Knee extension 0 0   SHOULDER:      Shoulder flexion 138 158  Shoulder abduction 125 140  Shoulder ER 30 60   (Blank rows = not tested)    FUNCTIONAL TESTS:  12/04/23 : 5 time sit to stand: 17.2 seconds no UE support  GAIT: Distance walked: clinic distances, level surface Assistive device utilized: None Level of assistance: Complete Independence Comments: mild forward flexed trunk posture  TODAY'S TREATMENT                                                                           DATE: 12/18/23 TherEx:  Standing rows: level 4  band x 15 holding 3 sec Standing shoulder flexion level 3 band x 15 holding 3 sec Standing shoulder ER level 3 band x 15 (towel roll under elbow) Supine AAROM shoulder flexion c 2 # bar 2 x 10  Side-lying Rt ER 2 x 10 Side-lying Rt shoulder shoulder abduction c 1# weight Standing UE ranger flexion x 15 Rt UE Standing UE ranger circles both direction x 15 Rt UE Attempted rolling red physio-ball up and down the wall but pt unable to tolerate.      DATE:12/04/23 Therex: HEP instruction/performance c cues for techniques, handout provided.  Trial set performed of each for comprehension and symptom assessment.  See below for exercise list  PATIENT EDUCATION:  Education details: HEP, POC Person educated: Patient Education method: Explanation, Demonstration, Verbal cues, and Handouts Education comprehension: verbalized understanding, returned demonstration, and verbal cues required  HOME EXERCISE PROGRAM: Access Code: [email or web address] URL: [email or web address] Date: 12/05/2023 Prepared by: Delon Lunger  Exercises - Standing Shoulder Row with Anchored Resistance  - 1 x daily - 7 x weekly - 3 sets - 10 reps - Supine Bridge  - 2 x daily - 7 x weekly - 2 sets - 10 reps - 5 seconds hold - Supine Shoulder External Rotation in 45 Degrees Abduction AAROM with Dowel  - 1 x daily - 7 x weekly - 3 sets - 10 reps - Supine Shoulder Flexion Extension AAROM with Dowel  - 2-3 x daily - 7 x weekly - 2 sets - 10 reps - Seated Small Alternating Straight Leg Lifts with Heel Touch  - 2 x daily - 7 x weekly - 15-20 reps - Recumbent Bike  - 2-3 x daily - 7 x weekly  ASSESSMENT:  CLINICAL IMPRESSION: Treatment today focusing on Rt shoulder due to increased pain over the weekend and decreased ROM. Pt limited with Active shoulder flexion in standing. HHD measurement performed to pt's hamstrings and quads with noted difference.  Continue PT treatment plan progressing toward goals set.        OBJECTIVE IMPAIRMENTS: difficulty walking, decreased ROM, decreased strength, increased edema, impaired flexibility, impaired UE functional use, postural dysfunction, and pain.   ACTIVITY LIMITATIONS: lifting, bending, standing, squatting, sleeping, stairs, transfers, reach over head, and hygiene/grooming  PARTICIPATION LIMITATIONS: cleaning, community activity, and yard work  PERSONAL FACTORS: 3+ comorbidities: see pertinent history  are also affecting patient's functional outcome.   REHAB POTENTIAL: Good  CLINICAL DECISION MAKING: Evolving/moderate complexity  EVALUATION COMPLEXITY: High   GOALS: Goals reviewed with patient? Yes  SHORT TERM GOALS: (target date for Short term goals are 3 weeks 12/25/2023)   1.  Patient will demonstrate independent use of home exercise program to maintain progress from in clinic treatments.  Goal status:on-going 12/18/23  2. Pt to improve his 5 time sit to stand to </=  13  seconds with UE support.   Goal Status: New   LONG TERM GOALS: (target dates for all long term goals are 10 weeks  02/12/2024 )  1. Patient will demonstrate/report pain at worst less than or equal to 2/10 to facilitate minimal limitation in daily activity secondary to pain symptoms.  Goal status: New   2. Patient will demonstrate independent use of home exercise program to facilitate ability to maintain/progress functional gains from skilled physical therapy services.  Goal status: New   3. Patient will demonstrate FOTO outcome > or = 51 % to indicate reduced disability due to condition.  Goal status: New   4.  Patient will be able to perform up and down 1 flight of stairs with reciprocal gait pattern with left knee pain and low back pain </= 2/10.   Goal status: New   5.  Patient will demonstrate improved Rt UE flexion to >/= 150 degrees in order to improve functional mobility.  Goal status: New   6.  Pt will improve Rt shoulder ER to >/= 60 deg in order  to improve functional mobility.  Goal status: New      PLAN:  PT FREQUENCY: 1-2x/week  PT DURATION: 10 weeks  PLANNED INTERVENTIONS: Can include 02853- PT Re-evaluation, 97110-Therapeutic exercises, 97530- Therapeutic activity, [id number]- Neuromuscular re-education, 97535- Self Care, 97140- Manual therapy, [phone]- Gait training, [phone]- Orthotic Fit/training, [phone]- Canalith repositioning, [id number]- Aquatic Therapy, 97014- Electrical stimulation (unattended), [id number]- Electrical stimulation (manual), [id number]- Vasopneumatic device, [id number]- Ultrasound, [id number]- Traction (mechanical), [id number]- Ionotophoresis 4mg /ml Dexamethasone , Patient/Family education, Balance training, Stair training, Taping, Dry Needling, Joint mobilization, Joint manipulation, Spinal manipulation, Spinal mobilization, Scar mobilization, Vestibular training, Visual/preceptual remediation/compensation, DME instructions, Cryotherapy, and Moist heat.  All performed as medically necessary.  All included unless contraindicated  PLAN FOR NEXT SESSION:  shoulder ROM, overall strengthening of UEs and LEs        Delon JONELLE Lunger, PT, MPT 12/18/2023, 10:52 AM

## 2023-12-19 HISTORY — PX: TRIGGER FINGER RELEASE: SHX641

## 2023-12-24 ENCOUNTER — Encounter: Payer: Self-pay | Admitting: Physical Therapy

## 2023-12-24 ENCOUNTER — Ambulatory Visit (INDEPENDENT_AMBULATORY_CARE_PROVIDER_SITE_OTHER): Payer: Medicare Other | Admitting: Physical Therapy

## 2023-12-24 DIAGNOSIS — M5459 Other low back pain: Secondary | ICD-10-CM | POA: Diagnosis not present

## 2023-12-24 DIAGNOSIS — G8929 Other chronic pain: Secondary | ICD-10-CM

## 2023-12-24 DIAGNOSIS — M6281 Muscle weakness (generalized): Secondary | ICD-10-CM | POA: Diagnosis not present

## 2023-12-24 DIAGNOSIS — M25511 Pain in right shoulder: Secondary | ICD-10-CM | POA: Diagnosis not present

## 2023-12-24 DIAGNOSIS — M25562 Pain in left knee: Secondary | ICD-10-CM | POA: Diagnosis not present

## 2023-12-24 NOTE — Therapy (Signed)
 OUTPATIENT PHYSICAL THERAPY TREATMENT   Patient Name: ELIYAH BAZZI MRN: [id number] DOB:May 26, 1951, 73 y.o., male Today's Date: 12/24/2023  END OF SESSION:  PT End of Session - 12/24/23 1306     Visit Number 3    Number of Visits 20    Date for PT Re-Evaluation 02/13/24    PT Start Time 1300    PT Stop Time 1345    PT Time Calculation (min) 45 min    Activity Tolerance Patient tolerated treatment well    Behavior During Therapy WFL for tasks assessed/performed              Past Medical History:  Diagnosis Date   Arthritis    R shoulder, great toes, hands- has gout & has injections in knees with Dr. [name]    Bicuspid aortic valve with ascending aorta 4.0 to 4.5 cm in diameter    CAD (coronary artery disease)    Cancer (HCC)    skin ca-    GERD (gastroesophageal reflux disease)    Headache    History of hiatal hernia    Hypertension    Prostate cancer (HCC)    S/P TAVR (transcatheter aortic valve replacement)    SDH (subdural hematoma) (HCC)    Severe aortic stenosis    Thoracic ascending aortic aneurysm (HCC)    Vertebral artery aneurysm (HCC)    Past Surgical History:  Procedure Laterality Date   BRAIN SURGERY  2016   evacuation of SDH   CARDIAC CATHETERIZATION     CARPAL TUNNEL RELEASE Bilateral    embolization of left vertebral artery aneurysm with pipeline/coil  10/31/2022   GREEN LIGHT LASER TURP (TRANSURETHRAL RESECTION OF PROSTATE  04/27/2017   KNEE ARTHROPLASTY     LEFT HEART CATH AND CORONARY ANGIOGRAPHY N/A 10/08/2019   Procedure: LEFT HEART CATH AND CORONARY ANGIOGRAPHY;  Surgeon: Wonda Sharper, MD;  Location: [facility] INVASIVE CV LAB;  Service: Cardiovascular;  Laterality: N/A;   QUADRICEPS TENDON REPAIR Bilateral 06/15/2017   Procedure: REPAIR QUADRICEP TENDON;  Surgeon: Addie Cordella Glendia, MD;  Location: [facility] OR;  Service: Orthopedics;  Laterality: Bilateral;   SHOULDER SURGERY Right    TEE WITHOUT CARDIOVERSION N/A 07/03/2022   Procedure:  TRANSESOPHAGEAL ECHOCARDIOGRAM (TEE);  Surgeon: Delford Maude BROCKS, MD;  Location: [facility] ENDOSCOPY;  Service: Cardiovascular;  Laterality: N/A;   TRANSCATHETER AORTIC VALVE REPLACEMENT, TRANSFEMORAL  12/23/2019   TRANSCATHETER AORTIC VALVE REPLACEMENT, TRANSFEMORAL N/A 12/23/2019   Procedure: TRANSCATHETER AORTIC VALVE REPLACEMENT, TRANSFEMORAL;  Surgeon: Wonda Sharper, MD;  Location: [facility] OR;  Service: Open Heart Surgery;  Laterality: N/A;   VASCULAR SURGERY     Patient Active Problem List   Diagnosis Date Noted   Hypertension    Severe aortic stenosis 10/08/2019   Candidiasis    Abnormality of gait    Hypoalbuminemia due to protein-calorie malnutrition [facility])    History of gout    Sleep disturbance    Constipation    Rupture of quadriceps tendon, right, sequela 06/19/2017   Quadriceps tendon rupture, left, sequela 06/19/2017   Acute blood loss anemia 06/19/2017   Fall    History of subdural hematoma    Post-operative pain    Recurrent falls    Quadriceps tendon rupture 06/15/2017   Primary osteoarthritis of both knees 02/28/2017   Primary osteoarthritis of both hands 02/28/2017   Uricacidemia 02/28/2017   Pain in joint of left knee 02/07/2017   Idiopathic chronic gout, unspecified site, without tophus (tophi) 11/29/2016   HEMATURIA UNSPECIFIED 01/25/2009  Abdominal pain 01/25/2009   XERODERMA 03/10/2008   UNS ADVRS EFF UNS RX MEDICINAL&BIOLOGICAL SBSTNC 03/10/2008   ADJUSTMENT REACTION WITH PHYSICAL SYMPTOMS 02/14/2008   MUSCLE SPASM 02/14/2008   ACUTE PROSTATITIS 12/16/2007   BREAST LUMP OR MASS, RIGHT 07/12/2007   H/O: RCT (rotator cuff tear) 01/13/2007   GOUT 01/08/2007    PCP: Birda Ahumada, MD   REFERRING PROVIDER: Addie Cordella Hamilton, MD   REFERRING DIAG:  [phone] (ICD-10-CM) - Synovitis of left knee  M19.011 (ICD-10-CM) - Primary osteoarthritis, right shoulder  M54.50 (ICD-10-CM) - Low back pain, unspecified back pain laterality, unspecified chronicity,  unspecified whether sciatica present    THERAPY DIAG:  Chronic right shoulder pain  Chronic pain of left knee  Other low back pain  Muscle weakness (generalized)  Rationale for Evaluation and Treatment: Rehabilitation  ONSET DATE: see subjective, on-going for years  SUBJECTIVE:    SUBJECTIVE STATEMENT: Pt stating most of the pain is in his back today, 10/10, it is bothered by waking and being on his feet 6-7 hours.   EVAL:  Pt stating he has been painting a lot at home and feels it may have exacerbated his Rt shoulder. Pt reporting radiation pain down his arm but no numbness/tingling. Pt stating Rt shoulder is giving him the most problem. Pt stating after getting the second shingles shot his shoulder pain increased into tricep and shoulder. Pt stating pain has been ongoing for about a year. Pt stating pain is worse at night.  Pt stating left knee pain has been ongoing since January 2018 when he was skiing and twisted his knee and he tore his quadriceps tendon. Pt stating he fell and when he fell he tore both tendons in bil LE. Pt then stating the repair didn't take on his left LE and it was repaired again. Pt stating he had PT and he gained ROM back in bil LE.  Pt reporting falling while in hospital during a bed transfer where he landed on his floor on his back in 2018. Pt stating his back has bothered him on/off ever since.  Pt stating he hasn't been able to work out over the past few months due to recent move into [place].    PERTINENT HISTORY: HTN, severe aortic stenosis, abnormality of gait, h/o gout, rupture of quadriceps tendon on Rt 2018 on left 2018, subdural hematoma 2016, h/o falls, primary OA in bil hands Polio on Left side affecting Left UE strength   PAIN:  NPRS scale: Rt shoulder:no pain upon arrival, 13/10 last night Pain description: achy, throbbing Aggravating factors: resting, tylenol , voltaren  Relieving factors: certain movement  PRECAUTIONS:  None  WEIGHT BEARING RESTRICTIONS: No  FALLS:  Has patient fallen in last 6 months? No falls in last few years. Haven't fell since he tore his quad tendons and during a hospital transfer several years ago  LIVING ENVIRONMENT: Lives with: lives with their family and lives with an adult companion Lives in: House/apartment Stairs: Yes: Internal: 17 steps; on left going up and External: 2 steps; on left going up Has following equipment at home: None  OCCUPATION: retired  PLOF: Independent  PATIENT GOALS: Be able to work out at gym with less pain  Next MD visit:   OBJECTIVE:   DIAGNOSTIC FINDINGS: Shoulder: 08/06/23  3 views of the right shoulder including Grashey, scapular Y and axial view  were ordered and reviewed by myself.  X-rays show severe osteoarthritis of  the right glenohumeral joint.  There is a large inferior spur  off the  humeral head.  There is some chondrocalcinosis superior to the humeral  head, likely indicative of pseudogout or prior gout.  No acute bony  fracture noted.  There is bony sclerosis noted of the humeral head and  inferior acetabulum from likely arthritic change.   Knee 05/22/23:   1. Postsurgical changes of quadriceps tendon repair. The tendon appears intact. Resolution of the prior small fluid bright signal within the midsubstance of the quadriceps tendon insertion on prior 10/01/2018 MRI. 2. Stable chronic oblique tear of the anterior horn of the lateral meniscus. Mild degenerative undersurface fraying of the posterior horn of the lateral meniscus. 3. Interval worsening of moderate to severe patellofemoral cartilage degenerative changes. 4. Mildly worsened cartilage loss within the anteromedial aspect of the weight-bearing lateral femoral condyle and adjacent lateral tibial plateau. 5. Unchanged high-grade partial to full-thickness cartilage loss within the weight-bearing medial femoral condyle.  Lumbar: 05/22/23:  AP lateral radiographs  lumbar spine reviewed.  Grade 1-2 spondylolisthesis present at L5-S1 with pars fracture noted at the corresponding level.   Mild to moderate degenerative changes present in the upper and and middle lumbar spine region.  Visualized hips without arthritis    PATIENT SURVEYS:  12/05/23: FOTO intake:  49%  predicted:  51%  COGNITION: Overall cognitive status: WFL    SENSATION: WFL   POSTURE:  rounded shoulders, forward head, and decreased lumbar lordosis  PALPATION: TTP: distal left quad tendon, Rt upper trap and levator and anterior shoulder  MMT   Right 12/04/23 Left 12/04/23 Rt  / Left 12/18/23 HHD ppsi  Hip flexion 5 5   Hip extension 5 5   Hip abduction 5 5   Hip adduction 5 5   Hip internal rotation     Hip external rotation     Knee flexion 5 5 52.5 / 40.0  Knee extension 5 5 54.4 / 65.5                   SHOULDER      Shoulder flexion 4 3   Shoulder abduction 4 3   Shoulder ER 4- 3   Shoulder IR 4- 3    (Blank rows = not tested)    ROM  LE Right 12/04/23 Left 12/04/23  Hip flexion 108 110  Hip extension    Hip abduction    Hip adduction    Hip internal rotation    Hip external rotation    Knee flexion 134 126  Knee extension 0 0   SHOULDER:      Shoulder flexion 138 158  Shoulder abduction 125 140  Shoulder ER 30 60   (Blank rows = not tested)    FUNCTIONAL TESTS:  12/04/23 : 5 time sit to stand: 17.2 seconds no UE support  GAIT: Distance walked: clinic distances, level surface Assistive device utilized: None Level of assistance: Complete Independence Comments: mild forward flexed trunk posture  TODAY'S TREATMENT                                                                           DATE: 12/24/23 TherEx:  Nu step L6 X 10 min UE/LE with heat to low back Standing  Lumbar L stretch for lumbar/shoulder stretching 5 sec X 10 Leg press DL 24# [id number], then SL 49# K84 bilat Supine low trunk rotations 5 sec X 10 bilat Supine SKTC stretch 30 sec X 2 bilat Supine bridges X 15, holding 5 sec Standing alternating unilat rows: blue band x 15 holding 3 sec Shoulder extensions blue band  X15 bilat   DATE: 12/18/23 TherEx:  Standing rows: level 4 band x 15 holding 3 sec Standing shoulder flexion level 3 band x 15 holding 3 sec Standing shoulder ER level 3 band x 15 (towel roll under elbow) Supine AAROM shoulder flexion c 2 # bar 2 x 10  Side-lying Rt ER 2 x 10 Side-lying Rt shoulder shoulder abduction c 1# weight Standing UE ranger flexion x 15 Rt UE Standing UE ranger circles both direction x 15 Rt UE Attempted rolling red physio-ball up and down the wall but pt unable to tolerate.      DATE:12/04/23 Therex: HEP instruction/performance c cues for techniques, handout provided.  Trial set performed of each for comprehension and symptom assessment.  See below for exercise list  PATIENT EDUCATION:  Education details: HEP, POC Person educated: Patient Education method: Explanation, Demonstration, Verbal cues, and Handouts Education comprehension: verbalized understanding, returned demonstration, and verbal cues required  HOME EXERCISE PROGRAM: Access Code: [email or web address] URL: [email or web address] Date: 12/05/2023 Prepared by: Delon Lunger  Exercises - Standing Shoulder Row with Anchored Resistance  - 1 x daily - 7 x weekly - 3 sets - 10 reps - Supine Bridge  - 2 x daily - 7 x weekly - 2 sets - 10 reps - 5 seconds hold - Supine Shoulder External Rotation in 45 Degrees Abduction AAROM with Dowel  - 1 x daily - 7 x weekly - 3 sets - 10 reps - Supine Shoulder Flexion Extension AAROM with Dowel  - 2-3 x daily - 7 x weekly - 2 sets - 10 reps - Seated Small Alternating Straight Leg Lifts with Heel Touch  - 2 x daily - 7 x weekly - 15-20 reps -  Recumbent Bike  - 2-3 x daily - 7 x weekly  ASSESSMENT:  CLINICAL IMPRESSION: His back was bothering him more than anything so treatment today was more focused on his back pain today. He had good tolerance to this Continue PT treatment plan progressing toward goals set.    OBJECTIVE IMPAIRMENTS: difficulty walking, decreased ROM, decreased strength, increased edema, impaired flexibility, impaired UE functional use, postural dysfunction, and pain.   ACTIVITY LIMITATIONS: lifting, bending, standing, squatting, sleeping, stairs, transfers, reach over head, and hygiene/grooming  PARTICIPATION LIMITATIONS: cleaning, community activity, and yard work  PERSONAL FACTORS: 3+ comorbidities: see pertinent history  are also affecting patient's functional outcome.   REHAB POTENTIAL: Good  CLINICAL DECISION MAKING: Evolving/moderate complexity  EVALUATION COMPLEXITY: High   GOALS: Goals reviewed with patient? Yes  SHORT TERM GOALS: (target date for Short term goals are 3 weeks 12/25/2023)  1.  Patient will demonstrate independent use of home exercise program to maintain progress from in clinic treatments.  Goal status:on-going 12/18/23  2. Pt to improve his 5 time sit to stand to </=  13  seconds with UE support.   Goal Status: New   LONG TERM GOALS: (target dates for all long term goals are 10 weeks  02/12/2024 )   1. Patient will demonstrate/report pain at worst less than or equal to 2/10 to facilitate minimal limitation in daily activity secondary to pain symptoms.  Goal status: New   2. Patient will demonstrate independent use of home exercise program to facilitate ability to maintain/progress functional gains from skilled physical therapy services.  Goal status: New   3. Patient will demonstrate FOTO outcome > or = 51 % to indicate reduced disability due to condition.  Goal status: New   4.  Patient will be able to perform up and down 1 flight of stairs with reciprocal gait  pattern with left knee pain and low back pain </= 2/10.   Goal status: New   5.  Patient will demonstrate improved Rt UE flexion to >/= 150 degrees in order to improve functional mobility.  Goal status: New   6.  Pt will improve Rt shoulder ER to >/= 60 deg in order to improve functional mobility.  Goal status: New      PLAN:  PT FREQUENCY: 1-2x/week  PT DURATION: 10 weeks  PLANNED INTERVENTIONS: Can include 02853- PT Re-evaluation, 97110-Therapeutic exercises, 97530- Therapeutic activity, [id number]- Neuromuscular re-education, 97535- Self Care, 97140- Manual therapy, [phone]- Gait training, [phone]- Orthotic Fit/training, [phone]- Canalith repositioning, [id number]- Aquatic Therapy, 97014- Electrical stimulation (unattended), [id number]- Electrical stimulation (manual), [id number]- Vasopneumatic device, [id number]- Ultrasound, [id number]- Traction (mechanical), [id number]- Ionotophoresis 4mg /ml Dexamethasone , Patient/Family education, Balance training, Stair training, Taping, Dry Needling, Joint mobilization, Joint manipulation, Spinal manipulation, Spinal mobilization, Scar mobilization, Vestibular training, Visual/preceptual remediation/compensation, DME instructions, Cryotherapy, and Moist heat.  All performed as medically necessary.  All included unless contraindicated  PLAN FOR NEXT SESSION:  shoulder and back exercises, overall strengthening of UEs and Les, core   Redell JONELLE Moose, PT, DPT 12/24/2023, 1:46 PM

## 2023-12-26 ENCOUNTER — Encounter: Payer: Self-pay | Admitting: Physical Therapy

## 2023-12-26 ENCOUNTER — Ambulatory Visit: Payer: Medicare Other | Admitting: Physical Therapy

## 2023-12-26 DIAGNOSIS — M25511 Pain in right shoulder: Secondary | ICD-10-CM | POA: Diagnosis not present

## 2023-12-26 DIAGNOSIS — G8929 Other chronic pain: Secondary | ICD-10-CM

## 2023-12-26 DIAGNOSIS — M6281 Muscle weakness (generalized): Secondary | ICD-10-CM

## 2023-12-26 DIAGNOSIS — M5459 Other low back pain: Secondary | ICD-10-CM | POA: Diagnosis not present

## 2023-12-26 DIAGNOSIS — M25562 Pain in left knee: Secondary | ICD-10-CM

## 2023-12-26 NOTE — Therapy (Signed)
 OUTPATIENT PHYSICAL THERAPY TREATMENT   Patient Name: Jose [name] MRN: [id number] DOB:1951-11-12, 73 y.o., male Today's Date: 12/26/2023  END OF SESSION:     Past Medical History:  Diagnosis Date   Arthritis    R shoulder, great toes, hands- has gout & has injections in knees with Dr. [name]    Bicuspid aortic valve with ascending aorta 4.0 to 4.5 cm in diameter    CAD (coronary artery disease)    Cancer (HCC)    skin ca-    GERD (gastroesophageal reflux disease)    Headache    History of hiatal hernia    Hypertension    Prostate cancer (HCC)    S/P TAVR (transcatheter aortic valve replacement)    SDH (subdural hematoma) (HCC)    Severe aortic stenosis    Thoracic ascending aortic aneurysm (HCC)    Vertebral artery aneurysm [facility])    Past Surgical History:  Procedure Laterality Date   BRAIN SURGERY  2016   evacuation of SDH   CARDIAC CATHETERIZATION     CARPAL TUNNEL RELEASE Bilateral    embolization of left vertebral artery aneurysm with pipeline/coil  10/31/2022   GREEN LIGHT LASER TURP (TRANSURETHRAL RESECTION OF PROSTATE  04/27/2017   KNEE ARTHROPLASTY     LEFT HEART CATH AND CORONARY ANGIOGRAPHY N/A 10/08/2019   Procedure: LEFT HEART CATH AND CORONARY ANGIOGRAPHY;  Surgeon: Wonda Sharper, MD;  Location: [facility] INVASIVE CV LAB;  Service: Cardiovascular;  Laterality: N/A;   QUADRICEPS TENDON REPAIR Bilateral 06/15/2017   Procedure: REPAIR QUADRICEP TENDON;  Surgeon: Addie Cordella Glendia, MD;  Location: [facility] OR;  Service: Orthopedics;  Laterality: Bilateral;   SHOULDER SURGERY Right    TEE WITHOUT CARDIOVERSION N/A 07/03/2022   Procedure: TRANSESOPHAGEAL ECHOCARDIOGRAM (TEE);  Surgeon: Delford Maude BROCKS, MD;  Location: [facility] ENDOSCOPY;  Service: Cardiovascular;  Laterality: N/A;   TRANSCATHETER AORTIC VALVE REPLACEMENT, TRANSFEMORAL  12/23/2019   TRANSCATHETER AORTIC VALVE REPLACEMENT, TRANSFEMORAL N/A 12/23/2019   Procedure: TRANSCATHETER AORTIC VALVE REPLACEMENT,  TRANSFEMORAL;  Surgeon: Wonda Sharper, MD;  Location: [facility] OR;  Service: Open Heart Surgery;  Laterality: N/A;   VASCULAR SURGERY     Patient Active Problem List   Diagnosis Date Noted   Hypertension    Severe aortic stenosis 10/08/2019   Candidiasis    Abnormality of gait    Hypoalbuminemia due to protein-calorie malnutrition [facility])    History of gout    Sleep disturbance    Constipation    Rupture of quadriceps tendon, right, sequela 06/19/2017   Quadriceps tendon rupture, left, sequela 06/19/2017   Acute blood loss anemia 06/19/2017   Fall    History of subdural hematoma    Post-operative pain    Recurrent falls    Quadriceps tendon rupture 06/15/2017   Primary osteoarthritis of both knees 02/28/2017   Primary osteoarthritis of both hands 02/28/2017   Uricacidemia 02/28/2017   Pain in joint of left knee 02/07/2017   Idiopathic chronic gout, unspecified site, without tophus (tophi) 11/29/2016   HEMATURIA UNSPECIFIED 01/25/2009   Abdominal pain 01/25/2009   XERODERMA 03/10/2008   UNS ADVRS EFF UNS RX MEDICINAL&BIOLOGICAL SBSTNC 03/10/2008   ADJUSTMENT REACTION WITH PHYSICAL SYMPTOMS 02/14/2008   MUSCLE SPASM 02/14/2008   ACUTE PROSTATITIS 12/16/2007   BREAST LUMP OR MASS, RIGHT 07/12/2007   H/O: RCT (rotator cuff tear) 01/13/2007   GOUT 01/08/2007    PCP: Birda Ahumada, MD   REFERRING PROVIDER: Addie Cordella Glendia, MD   REFERRING DIAG:  [phone] (ICD-10-CM) - Synovitis of left  knee  M19.011 (ICD-10-CM) - Primary osteoarthritis, right shoulder  M54.50 (ICD-10-CM) - Low back pain, unspecified back pain laterality, unspecified chronicity, unspecified whether sciatica present    THERAPY DIAG:  No diagnosis found.  Rationale for Evaluation and Treatment: Rehabilitation  ONSET DATE: see subjective, on-going for years    PERTINENT HISTORY: HTN, severe aortic stenosis, abnormality of gait, h/o gout, rupture of quadriceps tendon on Rt 2018 on left 2018, subdural  hematoma 2016, h/o falls, primary OA in bil hands Polio on Left side affecting Left UE strength  SUBJECTIVE:    SUBJECTIVE STATEMENT: Pt stating his back feels better than last time.    EVAL:  Pt stating he has been painting a lot at home and feels it may have exacerbated his Rt shoulder. Pt reporting radiation pain down his arm but no numbness/tingling. Pt stating Rt shoulder is giving him the most problem. Pt stating after getting the second shingles shot his shoulder pain increased into tricep and shoulder. Pt stating pain has been ongoing for about a year. Pt stating pain is worse at night.  Pt stating left knee pain has been ongoing since January 2018 when he was skiing and twisted his knee and he tore his quadriceps tendon. Pt stating he fell and when he fell he tore both tendons in bil LE. Pt then stating the repair didn't take on his left LE and it was repaired again. Pt stating he had PT and he gained ROM back in bil LE.  Pt reporting falling while in hospital during a bed transfer where he landed on his floor on his back in 2018. Pt stating his back has bothered him on/off ever since.  Pt stating he hasn't been able to work out over the past few months due to recent move into [place].   PAIN:  NPRS scale: 6/10 back pain today Pain description: achy, throbbing Aggravating factors: resting, tylenol , voltaren  Relieving factors: certain movement  PRECAUTIONS: None  WEIGHT BEARING RESTRICTIONS: No  FALLS:  Has patient fallen in last 6 months? No falls in last few years. Haven't fell since he tore his quad tendons and during a hospital transfer several years ago  LIVING ENVIRONMENT: Lives with: lives with their family and lives with an adult companion Lives in: House/apartment Stairs: Yes: Internal: 17 steps; on left going up and External: 2 steps; on left going up Has following equipment at home: None  OCCUPATION: retired  PLOF: Independent  PATIENT GOALS: Be able to  work out at gym with less pain  Next MD visit:   OBJECTIVE:   DIAGNOSTIC FINDINGS: Shoulder: 08/06/23  3 views of the right shoulder including Grashey, scapular Y and axial view  were ordered and reviewed by myself.  X-rays show severe osteoarthritis of  the right glenohumeral joint.  There is a large inferior spur off the  humeral head.  There is some chondrocalcinosis superior to the humeral  head, likely indicative of pseudogout or prior gout.  No acute bony  fracture noted.  There is bony sclerosis noted of the humeral head and  inferior acetabulum from likely arthritic change.   Knee 05/22/23:   1. Postsurgical changes of quadriceps tendon repair. The tendon appears intact. Resolution of the prior small fluid bright signal within the midsubstance of the quadriceps tendon insertion on prior 10/01/2018 MRI. 2. Stable chronic oblique tear of the anterior horn of the lateral meniscus. Mild degenerative undersurface fraying of the posterior horn of the lateral meniscus. 3. Interval worsening of moderate  to severe patellofemoral cartilage degenerative changes. 4. Mildly worsened cartilage loss within the anteromedial aspect of the weight-bearing lateral femoral condyle and adjacent lateral tibial plateau. 5. Unchanged high-grade partial to full-thickness cartilage loss within the weight-bearing medial femoral condyle.  Lumbar: 05/22/23:  AP lateral radiographs lumbar spine reviewed.  Grade 1-2 spondylolisthesis present at L5-S1 with pars fracture noted at the corresponding level.   Mild to moderate degenerative changes present in the upper and and middle lumbar spine region.  Visualized hips without arthritis    PATIENT SURVEYS:  12/05/23: FOTO intake:  49%  predicted:  51%  COGNITION: Overall cognitive status: WFL    SENSATION: WFL   POSTURE:  rounded shoulders, forward head, and decreased lumbar lordosis  PALPATION: TTP: distal left quad tendon, Rt upper trap and  levator and anterior shoulder  MMT   Right 12/04/23 Left 12/04/23 Rt  / Left 12/18/23 HHD ppsi  Hip flexion 5 5   Hip extension 5 5   Hip abduction 5 5   Hip adduction 5 5   Hip internal rotation     Hip external rotation     Knee flexion 5 5 52.5 / 40.0  Knee extension 5 5 54.4 / 65.5                   SHOULDER      Shoulder flexion 4 3   Shoulder abduction 4 3   Shoulder ER 4- 3   Shoulder IR 4- 3    (Blank rows = not tested)    ROM  LE Right 12/04/23 Left 12/04/23  Hip flexion 108 110  Hip extension    Hip abduction    Hip adduction    Hip internal rotation    Hip external rotation    Knee flexion 134 126  Knee extension 0 0   SHOULDER:      Shoulder flexion 138 158  Shoulder abduction 125 140  Shoulder ER 30 60   (Blank rows = not tested)    FUNCTIONAL TESTS:  12/04/23 : 5 time sit to stand: 17.2 seconds no UE support  GAIT: Distance walked: clinic distances, level surface Assistive device utilized: None Level of assistance: Complete Independence Comments: mild forward flexed trunk posture                                                                                                                                                                        TODAY'S TREATMENT  DATE: 12/26/23 TherEx:  Nu step L6 X 10 min UE/LE with heat to low back Standing Lumbar L stretch for lumbar/shoulder stretching 5 sec X 10 Leg press DL 24# [id number], then SL 49# [id number] bilat Supine low trunk rotations 5 sec X 10 bilat Supine SKTC stretch 30 sec X 2 bilat Supine bridges X 15, holding 5 sec Standing rows: blue band 2 x 15 holding 3 sec Shoulder extensions blue band  2X15 bilat Supine chest press into shoulder flexion stretch 3# bar AAROM X 15 reps   DATE: 12/24/23 TherEx:  Nu step L6 X 10 min UE/LE with heat to low back Standing Lumbar L stretch for lumbar/shoulder stretching 5 sec X  10 Leg press DL 24# [id number], then SL 49# K84 bilat Supine low trunk rotations 5 sec X 10 bilat Supine SKTC stretch 30 sec X 2 bilat Supine bridges X 15, holding 5 sec Standing alternating unilat rows: blue band x 15 holding 3 sec Shoulder extensions blue band  X15 bilat   DATE: 12/18/23 TherEx:  Standing rows: level 4 band x 15 holding 3 sec Standing shoulder flexion level 3 band x 15 holding 3 sec Standing shoulder ER level 3 band x 15 (towel roll under elbow) Supine AAROM shoulder flexion c 2 # bar 2 x 10  Side-lying Rt ER 2 x 10 Side-lying Rt shoulder shoulder abduction c 1# weight Standing UE ranger flexion x 15 Rt UE Standing UE ranger circles both direction x 15 Rt UE Attempted rolling red physio-ball up and down the wall but pt unable to tolerate.    PATIENT EDUCATION:  Education details: HEP, POC Person educated: Patient Education method: Programmer, Multimedia, Demonstration, Verbal cues, and Handouts Education comprehension: verbalized understanding, returned demonstration, and verbal cues required  HOME EXERCISE PROGRAM: Access Code: [email or web address] URL: [email or web address] Date: 12/05/2023 Prepared by: Delon Lunger  Exercises - Standing Shoulder Row with Anchored Resistance  - 1 x daily - 7 x weekly - 3 sets - 10 reps - Supine Bridge  - 2 x daily - 7 x weekly - 2 sets - 10 reps - 5 seconds hold - Supine Shoulder External Rotation in 45 Degrees Abduction AAROM with Dowel  - 1 x daily - 7 x weekly - 3 sets - 10 reps - Supine Shoulder Flexion Extension AAROM with Dowel  - 2-3 x daily - 7 x weekly - 2 sets - 10 reps - Seated Small Alternating Straight Leg Lifts with Heel Touch  - 2 x daily - 7 x weekly - 15-20 reps - Recumbent Bike  - 2-3 x daily - 7 x weekly  ASSESSMENT:  CLINICAL IMPRESSION: He was feeling better after last session so continued with same exercise program with slight progressions. PT will continue to work to improve pain and function.      OBJECTIVE IMPAIRMENTS: difficulty walking, decreased ROM, decreased strength, increased edema, impaired flexibility, impaired UE functional use, postural dysfunction, and pain.   ACTIVITY LIMITATIONS: lifting, bending, standing, squatting, sleeping, stairs, transfers, reach over head, and hygiene/grooming  PARTICIPATION LIMITATIONS: cleaning, community activity, and yard work  PERSONAL FACTORS: 3+ comorbidities: see pertinent history  are also affecting patient's functional outcome.   REHAB POTENTIAL: Good  CLINICAL DECISION MAKING: Evolving/moderate complexity  EVALUATION COMPLEXITY: High   GOALS: Goals reviewed with patient? Yes  SHORT TERM GOALS: (target date for Short term goals are 3 weeks 12/25/2023)   1.  Patient will demonstrate independent use of home exercise program to maintain progress from in clinic treatments.  Goal status:on-going 12/18/23  2. Pt to improve his 5 time sit to stand to </=  13  seconds with UE support.   Goal Status: New   LONG TERM GOALS: (target dates for all long term goals are 10 weeks  02/12/2024 )   1. Patient will demonstrate/report pain at worst less than or equal to 2/10 to facilitate minimal limitation in daily activity secondary to pain symptoms.  Goal status: New   2. Patient will demonstrate independent use of home exercise program to facilitate ability to maintain/progress functional gains from skilled physical therapy services.  Goal status: New   3. Patient will demonstrate FOTO outcome > or = 51 % to indicate reduced disability due to condition.  Goal status: New   4.  Patient will be able to perform up and down 1 flight of stairs with reciprocal gait pattern with left knee pain and low back pain </= 2/10.   Goal status: New   5.  Patient will demonstrate improved Rt UE flexion to >/= 150 degrees in order to improve functional mobility.  Goal status: New   6.  Pt will improve Rt shoulder ER to >/= 60 deg in order to  improve functional mobility.  Goal status: New      PLAN:  PT FREQUENCY: 1-2x/week  PT DURATION: 10 weeks  PLANNED INTERVENTIONS: Can include 02853- PT Re-evaluation, 97110-Therapeutic exercises, 97530- Therapeutic activity, [id number]- Neuromuscular re-education, 97535- Self Care, 97140- Manual therapy, [phone]- Gait training, [phone]- Orthotic Fit/training, [phone]- Canalith repositioning, [id number]- Aquatic Therapy, 97014- Electrical stimulation (unattended), [id number]- Electrical stimulation (manual), [id number]- Vasopneumatic device, [id number]- Ultrasound, [id number]- Traction (mechanical), [id number]- Ionotophoresis 4mg /ml Dexamethasone , Patient/Family education, Balance training, Stair training, Taping, Dry Needling, Joint mobilization, Joint manipulation, Spinal manipulation, Spinal mobilization, Scar mobilization, Vestibular training, Visual/preceptual remediation/compensation, DME instructions, Cryotherapy, and Moist heat.  All performed as medically necessary.  All included unless contraindicated  PLAN FOR NEXT SESSION:  shoulder and back exercises, overall strengthening of UEs and Les, core   Redell JONELLE Moose, PT, DPT 12/26/2023, 10:51 AM

## 2023-12-27 ENCOUNTER — Ambulatory Visit: Payer: Medicare Other | Attending: Rheumatology | Admitting: Rheumatology

## 2023-12-27 ENCOUNTER — Encounter: Payer: Self-pay | Admitting: Rheumatology

## 2023-12-27 VITALS — BP 108/73 | HR 61 | Resp 14 | Ht 72.0 in | Wt 206.0 lb

## 2023-12-27 DIAGNOSIS — M25511 Pain in right shoulder: Secondary | ICD-10-CM | POA: Diagnosis not present

## 2023-12-27 DIAGNOSIS — Z952 Presence of prosthetic heart valve: Secondary | ICD-10-CM

## 2023-12-27 DIAGNOSIS — Z5181 Encounter for therapeutic drug level monitoring: Secondary | ICD-10-CM

## 2023-12-27 DIAGNOSIS — G629 Polyneuropathy, unspecified: Secondary | ICD-10-CM

## 2023-12-27 DIAGNOSIS — M65341 Trigger finger, right ring finger: Secondary | ICD-10-CM

## 2023-12-27 DIAGNOSIS — M19072 Primary osteoarthritis, left ankle and foot: Secondary | ICD-10-CM | POA: Insufficient documentation

## 2023-12-27 DIAGNOSIS — S76112S Strain of left quadriceps muscle, fascia and tendon, sequela: Secondary | ICD-10-CM

## 2023-12-27 DIAGNOSIS — M1A09X0 Idiopathic chronic gout, multiple sites, without tophus (tophi): Secondary | ICD-10-CM | POA: Diagnosis not present

## 2023-12-27 DIAGNOSIS — A809 Acute poliomyelitis, unspecified: Secondary | ICD-10-CM

## 2023-12-27 DIAGNOSIS — G8929 Other chronic pain: Secondary | ICD-10-CM

## 2023-12-27 DIAGNOSIS — M19041 Primary osteoarthritis, right hand: Secondary | ICD-10-CM | POA: Diagnosis present

## 2023-12-27 DIAGNOSIS — M65351 Trigger finger, right little finger: Secondary | ICD-10-CM | POA: Diagnosis present

## 2023-12-27 DIAGNOSIS — M19071 Primary osteoarthritis, right ankle and foot: Secondary | ICD-10-CM

## 2023-12-27 DIAGNOSIS — M170 Bilateral primary osteoarthritis of knee: Secondary | ICD-10-CM | POA: Diagnosis present

## 2023-12-27 DIAGNOSIS — S065XAA Traumatic subdural hemorrhage with loss of consciousness status unknown, initial encounter: Secondary | ICD-10-CM | POA: Diagnosis present

## 2023-12-27 DIAGNOSIS — M25512 Pain in left shoulder: Secondary | ICD-10-CM | POA: Diagnosis not present

## 2023-12-27 DIAGNOSIS — M19042 Primary osteoarthritis, left hand: Secondary | ICD-10-CM | POA: Insufficient documentation

## 2023-12-28 LAB — COMPLETE METABOLIC PANEL WITH GFR
AG Ratio: 1.8 (calc) (ref 1.0–2.5)
ALT: 17 U/L (ref 9–46)
AST: 19 U/L (ref 10–35)
Albumin: 4.3 g/dL (ref 3.6–5.1)
Alkaline phosphatase (APISO): 74 U/L (ref 35–144)
BUN: 21 mg/dL (ref 7–25)
CO2: 27 mmol/L (ref 20–32)
Calcium: 9.9 mg/dL (ref 8.6–10.3)
Chloride: 105 mmol/L (ref 98–110)
Creat: 1.21 mg/dL (ref 0.70–1.28)
Globulin: 2.4 g/dL (ref 1.9–3.7)
Glucose, Bld: 84 mg/dL (ref 65–99)
Potassium: 4.5 mmol/L (ref 3.5–5.3)
Sodium: 142 mmol/L (ref 135–146)
Total Bilirubin: 0.5 mg/dL (ref 0.2–1.2)
Total Protein: 6.7 g/dL (ref 6.1–8.1)
eGFR: 63 mL/min/{1.73_m2} (ref >=60)

## 2023-12-28 LAB — CBC WITH DIFFERENTIAL/PLATELET
Absolute Lymphocytes: 1455 {cells}/uL (ref 850–3900)
Absolute Monocytes: 893 {cells}/uL (ref 200–950)
Basophils Absolute: 69 {cells}/uL (ref 0–200)
Basophils Relative: 0.9 %
Eosinophils Absolute: 123 {cells}/uL (ref 15–500)
Eosinophils Relative: 1.6 %
HCT: 45.1 % (ref 38.5–50.0)
Hemoglobin: 14.8 g/dL (ref 13.2–17.1)
MCH: 30.5 pg (ref 27.0–33.0)
MCHC: 32.8 g/dL (ref 32.0–36.0)
MCV: 92.8 fL (ref 80.0–100.0)
MPV: 11.9 fL (ref 7.5–12.5)
Monocytes Relative: 11.6 %
Neutro Abs: 5159 {cells}/uL (ref 1500–7800)
Neutrophils Relative %: 67 %
Platelets: 156 10*3/uL (ref 140–400)
RBC: 4.86 10*6/uL (ref 4.20–5.80)
RDW: 13.1 % (ref 11.0–15.0)
Total Lymphocyte: 18.9 %
WBC: 7.7 10*3/uL (ref 3.8–10.8)

## 2023-12-28 LAB — URIC ACID: Uric Acid, Serum: 3.5 mg/dL — ABNORMAL LOW (ref 4.0–8.0)

## 2023-12-28 NOTE — Progress Notes (Signed)
CBC and CMP are normal.  Uric acid is in the desirable range.

## 2023-12-31 ENCOUNTER — Ambulatory Visit (INDEPENDENT_AMBULATORY_CARE_PROVIDER_SITE_OTHER): Payer: Medicare Other | Admitting: Physical Therapy

## 2023-12-31 ENCOUNTER — Encounter: Payer: Self-pay | Admitting: Physical Therapy

## 2023-12-31 DIAGNOSIS — M25562 Pain in left knee: Secondary | ICD-10-CM

## 2023-12-31 DIAGNOSIS — M6281 Muscle weakness (generalized): Secondary | ICD-10-CM | POA: Diagnosis not present

## 2023-12-31 DIAGNOSIS — G8929 Other chronic pain: Secondary | ICD-10-CM

## 2023-12-31 DIAGNOSIS — M25511 Pain in right shoulder: Secondary | ICD-10-CM

## 2023-12-31 DIAGNOSIS — M5459 Other low back pain: Secondary | ICD-10-CM

## 2023-12-31 NOTE — Therapy (Signed)
 OUTPATIENT PHYSICAL THERAPY TREATMENT   Patient Name: Jose [name] MRN: [id number] DOB:[birth year], 73 y.o., male Today's Date: 12/31/2023  END OF SESSION:  PT End of Session - 12/31/23 1112     Visit Number 5    Number of Visits 20    Date for PT Re-Evaluation 02/13/24    PT Start Time 1105    PT Stop Time 1145    PT Time Calculation (min) 40 min    Activity Tolerance Patient tolerated treatment well    Behavior During Therapy WFL for tasks assessed/performed               Past Medical History:  Diagnosis Date   Arthritis    R shoulder, great toes, hands- has gout & has injections in knees with Dr. [name]    Bicuspid aortic valve with ascending aorta 4.0 to 4.5 cm in diameter    CAD (coronary artery disease)    Cancer (HCC)    skin ca-    GERD (gastroesophageal reflux disease)    Headache    History of hiatal hernia    Hypertension    Prostate cancer (HCC)    S/P TAVR (transcatheter aortic valve replacement)    SDH (subdural hematoma) (HCC)    Severe aortic stenosis    Thoracic ascending aortic aneurysm (HCC)    Vertebral artery aneurysm (HCC)    Past Surgical History:  Procedure Laterality Date   BRAIN SURGERY  2016   evacuation of SDH   CARDIAC CATHETERIZATION     CARPAL TUNNEL RELEASE Bilateral    embolization of left vertebral artery aneurysm with pipeline/coil  10/31/2022   GREEN LIGHT LASER TURP (TRANSURETHRAL RESECTION OF PROSTATE  04/27/2017   KNEE ARTHROPLASTY     LEFT HEART CATH AND CORONARY ANGIOGRAPHY N/A 10/08/2019   Procedure: LEFT HEART CATH AND CORONARY ANGIOGRAPHY;  Surgeon: Wonda Sharper, MD;  Location: [facility] INVASIVE CV LAB;  Service: Cardiovascular;  Laterality: N/A;   QUADRICEPS TENDON REPAIR Bilateral 06/15/2017   Procedure: REPAIR QUADRICEP TENDON;  Surgeon: Addie Cordella Glendia, MD;  Location: [facility] OR;  Service: Orthopedics;  Laterality: Bilateral;   SHOULDER SURGERY Right    TEE WITHOUT CARDIOVERSION N/A 07/03/2022   Procedure:  TRANSESOPHAGEAL ECHOCARDIOGRAM (TEE);  Surgeon: Delford Maude BROCKS, MD;  Location: [facility] ENDOSCOPY;  Service: Cardiovascular;  Laterality: N/A;   TRANSCATHETER AORTIC VALVE REPLACEMENT, TRANSFEMORAL  12/23/2019   TRANSCATHETER AORTIC VALVE REPLACEMENT, TRANSFEMORAL N/A 12/23/2019   Procedure: TRANSCATHETER AORTIC VALVE REPLACEMENT, TRANSFEMORAL;  Surgeon: Wonda Sharper, MD;  Location: [facility] OR;  Service: Open Heart Surgery;  Laterality: N/A;   VASCULAR SURGERY     Patient Active Problem List   Diagnosis Date Noted   Hypertension    Severe aortic stenosis 10/08/2019   Candidiasis    Abnormality of gait    Hypoalbuminemia due to protein-calorie malnutrition [facility])    History of gout    Sleep disturbance    Constipation    Rupture of quadriceps tendon, right, sequela 06/19/2017   Quadriceps tendon rupture, left, sequela 06/19/2017   Acute blood loss anemia 06/19/2017   Fall    History of subdural hematoma    Post-operative pain    Recurrent falls    Quadriceps tendon rupture 06/15/2017   Primary osteoarthritis of both knees 02/28/2017   Primary osteoarthritis of both hands 02/28/2017   Uricacidemia 02/28/2017   Pain in joint of left knee 02/07/2017   Idiopathic chronic gout, unspecified site, without tophus (tophi) 11/29/2016   HEMATURIA UNSPECIFIED 01/25/2009  Abdominal pain 01/25/2009   XERODERMA 03/10/2008   UNS ADVRS EFF UNS RX MEDICINAL&BIOLOGICAL SBSTNC 03/10/2008   ADJUSTMENT REACTION WITH PHYSICAL SYMPTOMS 02/14/2008   MUSCLE SPASM 02/14/2008   ACUTE PROSTATITIS 12/16/2007   BREAST LUMP OR MASS, RIGHT 07/12/2007   H/O: RCT (rotator cuff tear) 01/13/2007   GOUT 01/08/2007    PCP: Birda Ahumada, MD   REFERRING PROVIDER: Addie Cordella Hamilton, MD   REFERRING DIAG:  [phone] (ICD-10-CM) - Synovitis of left knee  M19.011 (ICD-10-CM) - Primary osteoarthritis, right shoulder  M54.50 (ICD-10-CM) - Low back pain, unspecified back pain laterality, unspecified chronicity,  unspecified whether sciatica present    THERAPY DIAG:  Chronic right shoulder pain  Chronic pain of left knee  Other low back pain  Muscle weakness (generalized)  Rationale for Evaluation and Treatment: Rehabilitation  ONSET DATE: see subjective, on-going for years    PERTINENT HISTORY: HTN, severe aortic stenosis, abnormality of gait, h/o gout, rupture of quadriceps tendon on Rt 2018 on left 2018, subdural hematoma 2016, h/o falls, primary OA in bil hands Polio on Left side affecting Left UE strength  SUBJECTIVE:    SUBJECTIVE STATEMENT: Pt stating he has been walking more up to 10,000 steps each day. Pt stating his thera-band exercises are going well. Pt reporting more pain at night into his hip and left buttock. Pt reports feeling a little stronger.   EVAL:  Pt stating he has been painting a lot at home and feels it may have exacerbated his Rt shoulder. Pt reporting radiation pain down his arm but no numbness/tingling. Pt stating Rt shoulder is giving him the most problem. Pt stating after getting the second shingles shot his shoulder pain increased into tricep and shoulder. Pt stating pain has been ongoing for about a year. Pt stating pain is worse at night.  Pt stating left knee pain has been ongoing since January 2018 when he was skiing and twisted his knee and he tore his quadriceps tendon. Pt stating he fell and when he fell he tore both tendons in bil LE. Pt then stating the repair didn't take on his left LE and it was repaired again. Pt stating he had PT and he gained ROM back in bil LE.  Pt reporting falling while in hospital during a bed transfer where he landed on his floor on his back in 2018. Pt stating his back has bothered him on/off ever since.  Pt stating he hasn't been able to work out over the past few months due to recent move into [place].   PAIN:  NPRS scale: 7/10 back/left hip pain today Pain description: achy, throbbing Aggravating factors: resting,  tylenol , voltaren  Relieving factors: certain movement  PRECAUTIONS: None  WEIGHT BEARING RESTRICTIONS: No  FALLS:  Has patient fallen in last 6 months? No falls in last few years. Haven't fell since he tore his quad tendons and during a hospital transfer several years ago  LIVING ENVIRONMENT: Lives with: lives with their family and lives with an adult companion Lives in: House/apartment Stairs: Yes: Internal: 17 steps; on left going up and External: 2 steps; on left going up Has following equipment at home: None  OCCUPATION: retired  PLOF: Independent  PATIENT GOALS: Be able to work out at gym with less pain  Next MD visit:   OBJECTIVE:   DIAGNOSTIC FINDINGS: Shoulder: 08/06/23  3 views of the right shoulder including Grashey, scapular Y and axial view  were ordered and reviewed by myself.  X-rays show severe osteoarthritis of  the right glenohumeral joint.  There is a large inferior spur off the  humeral head.  There is some chondrocalcinosis superior to the humeral  head, likely indicative of pseudogout or prior gout.  No acute bony  fracture noted.  There is bony sclerosis noted of the humeral head and  inferior acetabulum from likely arthritic change.   Knee 05/22/23:   1. Postsurgical changes of quadriceps tendon repair. The tendon appears intact. Resolution of the prior small fluid bright signal within the midsubstance of the quadriceps tendon insertion on prior 10/01/2018 MRI. 2. Stable chronic oblique tear of the anterior horn of the lateral meniscus. Mild degenerative undersurface fraying of the posterior horn of the lateral meniscus. 3. Interval worsening of moderate to severe patellofemoral cartilage degenerative changes. 4. Mildly worsened cartilage loss within the anteromedial aspect of the weight-bearing lateral femoral condyle and adjacent lateral tibial plateau. 5. Unchanged high-grade partial to full-thickness cartilage loss within the weight-bearing  medial femoral condyle.  Lumbar: 05/22/23:  AP lateral radiographs lumbar spine reviewed.  Grade 1-2 spondylolisthesis present at L5-S1 with pars fracture noted at the corresponding level.   Mild to moderate degenerative changes present in the upper and and middle lumbar spine region.  Visualized hips without arthritis    PATIENT SURVEYS:  12/05/23: FOTO intake:  49%  predicted:  51% 12/31/23: FOTO update 48%   COGNITION: Overall cognitive status: WFL    SENSATION: WFL   POSTURE:  rounded shoulders, forward head, and decreased lumbar lordosis  PALPATION: TTP: distal left quad tendon, Rt upper trap and levator and anterior shoulder  MMT   Right 12/04/23 Left 12/04/23 Rt  / Left 12/18/23 HHD ppsi  Hip flexion 5 5   Hip extension 5 5   Hip abduction 5 5   Hip adduction 5 5   Hip internal rotation     Hip external rotation     Knee flexion 5 5 52.5 / 40.0  Knee extension 5 5 54.4 / 65.5                   SHOULDER      Shoulder flexion 4 3   Shoulder abduction 4 3   Shoulder ER 4- 3   Shoulder IR 4- 3    (Blank rows = not tested)    ROM  LE Right 12/04/23 Left 12/04/23  Hip flexion 108 110  Hip extension    Hip abduction    Hip adduction    Hip internal rotation    Hip external rotation    Knee flexion 134 126  Knee extension 0 0   SHOULDER:      Shoulder flexion 138 158  Shoulder abduction 125 140  Shoulder ER 30 60   (Blank rows = not tested)    FUNCTIONAL TESTS:  12/04/23 : 5 time sit to stand: 17.2 seconds no UE support  GAIT: Distance walked: clinic distances, level surface Assistive device utilized: None Level of assistance: Complete Independence Comments: mild forward flexed trunk posture  TODAY'S TREATMENT                                                                            DATE: 12/31/23 TherEx:  Nu step L7 X 11 min UE/LE  BATCA rows: 35# 2 x 15 holding 3 sec (pt stating twinge in his left hip BATCA lat pull downs: 20# 2 x 10  Standing wall trunk extension: x 5 holding 10 sec  Wall push ups x 10  Leg press: 87# 2 x 10, single LE: 50# x 10  Standing hip flexor stretch: x 2 holding 30 seconds bil LE's Supine Bridge with clam shells: 2 x 10 green TB      TODAY'S TREATMENT                                                                           DATE: 12/26/23 TherEx:  Nu step L6 X 10 min UE/LE with heat to low back Standing Lumbar L stretch for lumbar/shoulder stretching 5 sec X 10 Leg press DL 24# 7K84, then SL 49# 7K84 bilat Supine low trunk rotations 5 sec X 10 bilat Supine SKTC stretch 30 sec X 2 bilat Supine bridges X 15, holding 5 sec Standing rows: blue band 2 x 15 holding 3 sec Shoulder extensions blue band  2X15 bilat Supine chest press into shoulder flexion stretch 3# bar AAROM X 15 reps   DATE: 12/24/23 TherEx:  Nu step L6 X 10 min UE/LE with heat to low back Standing Lumbar L stretch for lumbar/shoulder stretching 5 sec X 10 Leg press DL 24# 7K84, then SL 49# K84 bilat Supine low trunk rotations 5 sec X 10 bilat Supine SKTC stretch 30 sec X 2 bilat Supine bridges X 15, holding 5 sec Standing alternating unilat rows: blue band x 15 holding 3 sec Shoulder extensions blue band  X15 bilat      PATIENT EDUCATION:  Education details: HEP, POC Person educated: Patient Education method: Programmer, Multimedia, Facilities Manager, Verbal cues, and Handouts Education comprehension: verbalized understanding, returned demonstration, and verbal cues required  HOME EXERCISE PROGRAM: Access Code: [email or web address] URL: [email or web address] Date: 12/05/2023 Prepared by: Delon Lunger  Exercises - Standing Shoulder Row with Anchored Resistance  - 1 x daily - 7 x weekly - 3 sets - 10 reps - Supine Bridge  - 2 x daily - 7 x weekly - 2  sets - 10 reps - 5 seconds hold - Supine Shoulder External Rotation in 45 Degrees Abduction AAROM with Dowel  - 1 x daily - 7 x weekly - 3 sets - 10 reps - Supine Shoulder Flexion Extension AAROM with Dowel  - 2-3 x daily - 7 x weekly - 2 sets - 10 reps - Seated Small Alternating Straight Leg Lifts with Heel Touch  - 2 x daily - 7 x weekly - 15-20 reps - Recumbent Bike  - 2-3 x daily - 7 x weekly Clam shell Bridge: 2 x  10   ASSESSMENT:  CLINICAL IMPRESSION: Pt tolerating exercises well. Mild twinge noted with exercises in seated position. Bridge with TB exercise added to pt's HEP. Recommend continued skilled PT interventions.    OBJECTIVE IMPAIRMENTS: difficulty walking, decreased ROM, decreased strength, increased edema, impaired flexibility, impaired UE functional use, postural dysfunction, and pain.   ACTIVITY LIMITATIONS: lifting, bending, standing, squatting, sleeping, stairs, transfers, reach over head, and hygiene/grooming  PARTICIPATION LIMITATIONS: cleaning, community activity, and yard work  PERSONAL FACTORS: 3+ comorbidities: see pertinent history  are also affecting patient's functional outcome.   REHAB POTENTIAL: Good  CLINICAL DECISION MAKING: Evolving/moderate complexity  EVALUATION COMPLEXITY: High   GOALS: Goals reviewed with patient? Yes  SHORT TERM GOALS: (target date for Short term goals are 3 weeks 12/25/2023)   1.  Patient will demonstrate independent use of home exercise program to maintain progress from in clinic treatments.  Goal status:on-going 12/18/23  2. Pt to improve his 5 time sit to stand to </=  13  seconds with UE support.   Goal Status: New   LONG TERM GOALS: (target dates for all long term goals are 10 weeks  02/12/2024 )   1. Patient will demonstrate/report pain at worst less than or equal to 2/10 to facilitate minimal limitation in daily activity secondary to pain symptoms.  Goal status: New   2. Patient will demonstrate independent  use of home exercise program to facilitate ability to maintain/progress functional gains from skilled physical therapy services.  Goal status: New   3. Patient will demonstrate FOTO outcome > or = 51 % to indicate reduced disability due to condition.  Goal status: New   4.  Patient will be able to perform up and down 1 flight of stairs with reciprocal gait pattern with left knee pain and low back pain </= 2/10.   Goal status: New   5.  Patient will demonstrate improved Rt UE flexion to >/= 150 degrees in order to improve functional mobility.  Goal status: New   6.  Pt will improve Rt shoulder ER to >/= 60 deg in order to improve functional mobility.  Goal status: New      PLAN:  PT FREQUENCY: 1-2x/week  PT DURATION: 10 weeks  PLANNED INTERVENTIONS: Can include 02853- PT Re-evaluation, 97110-Therapeutic exercises, 97530- Therapeutic activity, [id number]- Neuromuscular re-education, 97535- Self Care, 97140- Manual therapy, [phone]- Gait training, [phone]- Orthotic Fit/training, [phone]- Canalith repositioning, [id number]- Aquatic Therapy, 97014- Electrical stimulation (unattended), [id number]- Electrical stimulation (manual), [id number]- Vasopneumatic device, [id number]- Ultrasound, [id number]- Traction (mechanical), [id number]- Ionotophoresis 4mg /ml Dexamethasone , Patient/Family education, Balance training, Stair training, Taping, Dry Needling, Joint mobilization, Joint manipulation, Spinal manipulation, Spinal mobilization, Scar mobilization, Vestibular training, Visual/preceptual remediation/compensation, DME instructions, Cryotherapy, and Moist heat.  All performed as medically necessary.  All included unless contraindicated  PLAN FOR NEXT SESSION:  shoulder and back exercises, overall strengthening of UEs and Les, core   Delon JONELLE Lunger, PT, MPT 12/31/2023, 11:47 AM

## 2024-01-02 ENCOUNTER — Encounter: Payer: Medicare Other | Admitting: Physical Therapy

## 2024-01-07 ENCOUNTER — Encounter: Payer: Medicare Other | Admitting: Physical Therapy

## 2024-01-08 ENCOUNTER — Other Ambulatory Visit: Payer: Self-pay | Admitting: Physician Assistant

## 2024-01-08 NOTE — Telephone Encounter (Signed)
Last Fill: 10/04/2023  Labs: 12/27/2023 CBC and CMP are normal.  Uric acid is in the desirable range.   Next Visit: 06/25/2024  Last Visit: 12/27/2023  DX: Idiopathic chronic gout of multiple sites without tophus   Current Dose per office note 12/27/2023: Allopurinol 300 mg p.o. daily.   Okay to refill Allopurinol?

## 2024-01-09 ENCOUNTER — Encounter: Payer: Medicare Other | Admitting: Physical Therapy

## 2024-01-14 ENCOUNTER — Encounter: Payer: Self-pay | Admitting: Physical Therapy

## 2024-01-14 ENCOUNTER — Ambulatory Visit (INDEPENDENT_AMBULATORY_CARE_PROVIDER_SITE_OTHER): Payer: Medicare Other | Admitting: Physical Therapy

## 2024-01-14 DIAGNOSIS — M25511 Pain in right shoulder: Secondary | ICD-10-CM | POA: Diagnosis not present

## 2024-01-14 DIAGNOSIS — M25562 Pain in left knee: Secondary | ICD-10-CM

## 2024-01-14 DIAGNOSIS — M5459 Other low back pain: Secondary | ICD-10-CM | POA: Diagnosis not present

## 2024-01-14 DIAGNOSIS — G8929 Other chronic pain: Secondary | ICD-10-CM

## 2024-01-14 DIAGNOSIS — M6281 Muscle weakness (generalized): Secondary | ICD-10-CM | POA: Diagnosis not present

## 2024-01-14 NOTE — Therapy (Signed)
OUTPATIENT PHYSICAL THERAPY TREATMENT   Patient Name: Jose [name] MRN: [id number] DOB:[birth year], 73 y.o., male Today's Date: 01/14/2024  END OF SESSION:  PT End of Session - 01/14/24 1131     Visit Number 6    Number of Visits 20    Date for PT Re-Evaluation 02/13/24    PT Start Time 1048    PT Stop Time 1130    PT Time Calculation (min) 42 min    Activity Tolerance Patient tolerated treatment well    Behavior During Therapy WFL for tasks assessed/performed                Past Medical History:  Diagnosis Date   Arthritis    R shoulder, great toes, hands- has gout & has injections in knees with Dr. Corliss Skains    Bicuspid aortic valve with ascending aorta 4.0 to 4.5 cm in diameter    CAD (coronary artery disease)    Cancer (HCC)    skin ca-    GERD (gastroesophageal reflux disease)    Headache    History of hiatal hernia    Hypertension    Prostate cancer (HCC)    S/P TAVR (transcatheter aortic valve replacement)    SDH (subdural hematoma) (HCC)    Severe aortic stenosis    Thoracic ascending aortic aneurysm (HCC)    Vertebral artery aneurysm (HCC)    Past Surgical History:  Procedure Laterality Date   BRAIN SURGERY  2016   evacuation of SDH   CARDIAC CATHETERIZATION     CARPAL TUNNEL RELEASE Bilateral    embolization of left vertebral artery aneurysm with pipeline/coil  10/31/2022   GREEN LIGHT LASER TURP (TRANSURETHRAL RESECTION OF PROSTATE  04/27/2017   KNEE ARTHROPLASTY     LEFT HEART CATH AND CORONARY ANGIOGRAPHY N/A 10/08/2019   Procedure: LEFT HEART CATH AND CORONARY ANGIOGRAPHY;  Surgeon: Tonny Bollman, MD;  Location: [facility] CV LAB;  Service: Cardiovascular;  Laterality: N/A;   QUADRICEPS TENDON REPAIR Bilateral 06/15/2017   Procedure: REPAIR QUADRICEP TENDON;  Surgeon: Cammy Copa, MD;  Location: [facility] OR;  Service: Orthopedics;  Laterality: Bilateral;   SHOULDER SURGERY Right    TEE WITHOUT CARDIOVERSION N/A 07/03/2022   Procedure:  TRANSESOPHAGEAL ECHOCARDIOGRAM (TEE);  Surgeon: Wendall Stade, MD;  Location: [facility] ENDOSCOPY;  Service: Cardiovascular;  Laterality: N/A;   TRANSCATHETER AORTIC VALVE REPLACEMENT, TRANSFEMORAL  12/23/2019   TRANSCATHETER AORTIC VALVE REPLACEMENT, TRANSFEMORAL N/A 12/23/2019   Procedure: TRANSCATHETER AORTIC VALVE REPLACEMENT, TRANSFEMORAL;  Surgeon: Tonny Bollman, MD;  Location: [facility] OR;  Service: Open Heart Surgery;  Laterality: N/A;   VASCULAR SURGERY     Patient Active Problem List   Diagnosis Date Noted   Hypertension    Severe aortic stenosis 10/08/2019   Candidiasis    Abnormality of gait    Hypoalbuminemia due to protein-calorie malnutrition [facility])    History of gout    Sleep disturbance    Constipation    Rupture of quadriceps tendon, right, sequela 06/19/2017   Quadriceps tendon rupture, left, sequela 06/19/2017   Acute blood loss anemia 06/19/2017   Fall    History of subdural hematoma    Post-operative pain    Recurrent falls    Quadriceps tendon rupture 06/15/2017   Primary osteoarthritis of both knees 02/28/2017   Primary osteoarthritis of both hands 02/28/2017   Uricacidemia 02/28/2017   Pain in joint of left knee 02/07/2017   Idiopathic chronic gout, unspecified site, without tophus (tophi) 11/29/2016   HEMATURIA UNSPECIFIED 01/25/2009  Abdominal pain 01/25/2009   XERODERMA 03/10/2008   UNS ADVRS EFF UNS RX MEDICINAL&BIOLOGICAL SBSTNC 03/10/2008   ADJUSTMENT REACTION WITH PHYSICAL SYMPTOMS 02/14/2008   MUSCLE SPASM 02/14/2008   ACUTE PROSTATITIS 12/16/2007   BREAST LUMP OR MASS, RIGHT 07/12/2007   H/O: RCT (rotator cuff tear) 01/13/2007   GOUT 01/08/2007    PCP: Earleen Reaper, MD   REFERRING PROVIDER: Cammy Copa, MD   REFERRING DIAG:  [phone] (ICD-10-CM) - Synovitis of left knee  M19.011 (ICD-10-CM) - Primary osteoarthritis, right shoulder  M54.50 (ICD-10-CM) - Low back pain, unspecified back pain laterality, unspecified chronicity,  unspecified whether sciatica present    THERAPY DIAG:  Chronic right shoulder pain  Chronic pain of left knee  Other low back pain  Muscle weakness (generalized)  Rationale for Evaluation and Treatment: Rehabilitation  ONSET DATE: see subjective, on-going for years    PERTINENT HISTORY: HTN, severe aortic stenosis, abnormality of gait, h/o gout, rupture of quadriceps tendon on Rt 2018 on left 2018, subdural hematoma 2016, h/o falls, primary OA in bil hands Polio on Left side affecting Left UE strength  SUBJECTIVE:    SUBJECTIVE STATEMENT: Pt reporting have a respiratory illness over the last week and he was unable to attend therapy. Pt arriving today reporting 5/10 pain in his low back and left hip.   EVAL:  Pt stating he has been painting a lot at home and feels it may have exacerbated his Rt shoulder. Pt reporting radiation pain down his arm but no numbness/tingling. Pt stating Rt shoulder is giving him the most problem. Pt stating after getting the second shingles shot his shoulder pain increased into tricep and shoulder. Pt stating pain has been ongoing for about a year. Pt stating pain is worse at night.  Pt stating left knee pain has been ongoing since January 2018 when he was skiing and twisted his knee and he tore his quadriceps tendon. Pt stating he fell and when he fell he tore both tendons in bil LE. Pt then stating the repair didn't take on his left LE and it was repaired again. Pt stating he had PT and he gained ROM back in bil LE.  Pt reporting falling while in hospital during a bed transfer where he landed on his floor on his back in 2018. Pt stating his back has bothered him on/off ever since.  Pt stating he hasn't been able to work out over the past few months due to recent move into [place].   PAIN:  NPRS scale: 5/10 back/left hip pain today Pain description: achy, throbbing Aggravating factors: resting, tylenol, voltaren Relieving factors: certain  movement  PRECAUTIONS: None  WEIGHT BEARING RESTRICTIONS: No  FALLS:  Has patient fallen in last 6 months? No falls in last few years. Haven't fell since he tore his quad tendons and during a hospital transfer several years ago  LIVING ENVIRONMENT: Lives with: lives with their family and lives with an adult companion Lives in: House/apartment Stairs: Yes: Internal: 17 steps; on left going up and External: 2 steps; on left going up Has following equipment at home: None  OCCUPATION: retired  PLOF: Independent  PATIENT GOALS: Be able to work out at gym with less pain  Next MD visit:   OBJECTIVE:   DIAGNOSTIC FINDINGS: Shoulder: 08/06/23  3 views of the right shoulder including Grashey, scapular Y and axial view  were ordered and reviewed by myself.  X-rays show severe osteoarthritis of  the right glenohumeral joint.  There is a  large inferior spur off the  humeral head.  There is some chondrocalcinosis superior to the humeral  head, likely indicative of pseudogout or prior gout.  No acute bony  fracture noted.  There is bony sclerosis noted of the humeral head and  inferior acetabulum from likely arthritic change.   Knee 05/22/23:   1. Postsurgical changes of quadriceps tendon repair. The tendon appears intact. Resolution of the prior small fluid bright signal within the midsubstance of the quadriceps tendon insertion on prior 10/01/2018 MRI. 2. Stable chronic oblique tear of the anterior horn of the lateral meniscus. Mild degenerative undersurface fraying of the posterior horn of the lateral meniscus. 3. Interval worsening of moderate to severe patellofemoral cartilage degenerative changes. 4. Mildly worsened cartilage loss within the anteromedial aspect of the weight-bearing lateral femoral condyle and adjacent lateral tibial plateau. 5. Unchanged high-grade partial to full-thickness cartilage loss within the weight-bearing medial femoral condyle.  Lumbar: 05/22/23:  AP  lateral radiographs lumbar spine reviewed.  Grade 1-2 spondylolisthesis present at L5-S1 with pars fracture noted at the corresponding level.   Mild to moderate degenerative changes present in the upper and and middle lumbar spine region.  Visualized hips without arthritis    PATIENT SURVEYS:  12/05/23: FOTO intake:  49%  predicted:  51% 12/31/23: FOTO update 48%   COGNITION: Overall cognitive status: WFL    SENSATION: WFL   POSTURE:  rounded shoulders, forward head, and decreased lumbar lordosis  PALPATION: TTP: distal left quad tendon, Rt upper trap and levator and anterior shoulder  MMT   Right 12/04/23 Left 12/04/23 Rt  / Left 12/18/23 HHD ppsi  Hip flexion 5 5   Hip extension 5 5   Hip abduction 5 5   Hip adduction 5 5   Hip internal rotation     Hip external rotation     Knee flexion 5 5 52.5 / 40.0  Knee extension 5 5 54.4 / 65.5                   SHOULDER      Shoulder flexion 4 3   Shoulder abduction 4 3   Shoulder ER 4- 3   Shoulder IR 4- 3    (Blank rows = not tested)    ROM  LE Right 12/04/23 Left 12/04/23  Hip flexion 108 110  Hip extension    Hip abduction    Hip adduction    Hip internal rotation    Hip external rotation    Knee flexion 134 126  Knee extension 0 0   SHOULDER:      Shoulder flexion 138 158  Shoulder abduction 125 140  Shoulder ER 30 60   (Blank rows = not tested)    FUNCTIONAL TESTS:  12/04/23 : 5 time sit to stand: 17.2 seconds no UE support  GAIT: Distance walked: clinic distances, level surface Assistive device utilized: None Level of assistance: Complete Independence Comments: mild forward flexed trunk posture  TODAY'S TREATMENT                                                                           DATE: 01/14/24 TherEx:  Nustep L6  X 10 min UE/LE  Standing rows: blue TB 2 x 15  Standing shoulder extension blue TB 2 x 15  Standing wall trunk extension: x 5 holding 10 sec  Wall push ups x 10  Leg press: 87# 2 x 10, single LE: 50# x 10  Supine trunk rotation: x 5 bil sides holding 20 sec Bridges: 2 x 10 holding 5 sec Prone: hip extension: 2 x 10 bil LE holding 3 sec Prone elbow press ups x 5 holding 10 sec Manual:  STM to QL on the left and left piriformis       TODAY'S TREATMENT                                                                           DATE: 12/31/23 TherEx:  Nu step L7 X 11 min UE/LE  BATCA rows: 35# 2 x 15 holding 3 sec (pt stating "twinge" in his left hip BATCA lat pull downs: 20# 2 x 10  Standing wall trunk extension: x 5 holding 10 sec  Wall push ups x 10  Leg press: 87# 2 x 10, single LE: 50# x 10  Standing hip flexor stretch: x 2 holding 30 seconds bil LE's Supine Bridge with clam shells: 2 x 10 green TB      TODAY'S TREATMENT                                                                           DATE: 12/26/23 TherEx:  Nu step L6 X 10 min UE/LE with heat to low back Standing Lumbar L stretch for lumbar/shoulder stretching 5 sec X 10 Leg press DL 16# 1W96, then SL 04# 5W09 bilat Supine low trunk rotations 5 sec X 10 bilat Supine SKTC stretch 30 sec X 2 bilat Supine bridges X 15, holding 5 sec Standing rows: blue band 2 x 15 holding 3 sec Shoulder extensions blue band  2X15 bilat Supine chest press into shoulder flexion stretch 3# bar AAROM X 15 reps    PATIENT EDUCATION:  Education details: HEP, POC Person educated: Patient Education method: Programmer, multimedia, Facilities manager, Verbal cues, and Handouts Education comprehension: verbalized understanding, returned demonstration, and verbal cues required  HOME EXERCISE PROGRAM: Access Code: [email or web address] URL: [email or web address] Date: 12/05/2023 Prepared by: Narda Amber  Exercises - Standing Shoulder Row with  Anchored Resistance  - 1 x daily - 7 x weekly - 3 sets - 10 reps - Supine Bridge  - 2 x daily -  7 x weekly - 2 sets - 10 reps - 5 seconds hold - Supine Shoulder External Rotation in 45 Degrees Abduction AAROM with Dowel  - 1 x daily - 7 x weekly - 3 sets - 10 reps - Supine Shoulder Flexion Extension AAROM with Dowel  - 2-3 x daily - 7 x weekly - 2 sets - 10 reps - Seated Small Alternating Straight Leg Lifts with Heel Touch  - 2 x daily - 7 x weekly - 15-20 reps - Recumbent Bike  - 2-3 x daily - 7 x weekly Clam shell Bridge: 2 x 10   ASSESSMENT:  CLINICAL IMPRESSION: Pt arriving today with 5/10 pain in his low back/left hip. Pt with good response to manual therapy this visit. Continue skilled PT per pt's treatment plan.    OBJECTIVE IMPAIRMENTS: difficulty walking, decreased ROM, decreased strength, increased edema, impaired flexibility, impaired UE functional use, postural dysfunction, and pain.   ACTIVITY LIMITATIONS: lifting, bending, standing, squatting, sleeping, stairs, transfers, reach over head, and hygiene/grooming  PARTICIPATION LIMITATIONS: cleaning, community activity, and yard work  PERSONAL FACTORS: 3+ comorbidities: see pertinent history  are also affecting patient's functional outcome.   REHAB POTENTIAL: Good  CLINICAL DECISION MAKING: Evolving/moderate complexity  EVALUATION COMPLEXITY: High   GOALS: Goals reviewed with patient? Yes  SHORT TERM GOALS: (target date for Short term goals are 3 weeks 12/25/2023)   1.  Patient will demonstrate independent use of home exercise program to maintain progress from in clinic treatments.  Goal status:on-going 12/18/23  2. Pt to improve his 5 time sit to stand to </=  13  seconds with UE support.   Goal Status: New   LONG TERM GOALS: (target dates for all long term goals are 10 weeks  02/12/2024 )   1. Patient will demonstrate/report pain at worst less than or equal to 2/10 to facilitate minimal limitation in daily  activity secondary to pain symptoms.  Goal status: New   2. Patient will demonstrate independent use of home exercise program to facilitate ability to maintain/progress functional gains from skilled physical therapy services.  Goal status: New   3. Patient will demonstrate FOTO outcome > or = 51 % to indicate reduced disability due to condition.  Goal status: New   4.  Patient will be able to perform up and down 1 flight of stairs with reciprocal gait pattern with left knee pain and low back pain </= 2/10.   Goal status: New   5.  Patient will demonstrate improved Rt UE flexion to >/= 150 degrees in order to improve functional mobility.  Goal status: New   6.  Pt will improve Rt shoulder ER to >/= 60 deg in order to improve functional mobility.  Goal status: New      PLAN:  PT FREQUENCY: 1-2x/week  PT DURATION: 10 weeks  PLANNED INTERVENTIONS: Can include 16109- PT Re-evaluation, 97110-Therapeutic exercises, 97530- Therapeutic activity, [id number]- Neuromuscular re-education, 97535- Self Care, 97140- Manual therapy, [phone]- Gait training, [phone]- Orthotic Fit/training, [phone]- Canalith repositioning, [id number]- Aquatic Therapy, 97014- Electrical stimulation (unattended), [id number]- Electrical stimulation (manual), [id number]- Vasopneumatic device, [id number]- Ultrasound, [id number]- Traction (mechanical), [id number]- Ionotophoresis 4mg /ml Dexamethasone, Patient/Family education, Balance training, Stair training, Taping, Dry Needling, Joint mobilization, Joint manipulation, Spinal manipulation, Spinal mobilization, Scar mobilization, Vestibular training, Visual/preceptual remediation/compensation, DME instructions, Cryotherapy, and Moist heat.  All performed as medically necessary.  All included unless contraindicated  PLAN FOR NEXT SESSION:  shoulder and back exercises, overall strengthening of UEs and LEs, core, try percussion or  STM as needed   Sharmon Leyden, PT, MPT 01/14/2024, 11:35 AM

## 2024-01-16 ENCOUNTER — Encounter: Payer: Self-pay | Admitting: Physical Therapy

## 2024-01-16 ENCOUNTER — Ambulatory Visit (INDEPENDENT_AMBULATORY_CARE_PROVIDER_SITE_OTHER): Payer: Medicare Other | Admitting: Physical Therapy

## 2024-01-16 DIAGNOSIS — M5459 Other low back pain: Secondary | ICD-10-CM | POA: Diagnosis not present

## 2024-01-16 DIAGNOSIS — M25511 Pain in right shoulder: Secondary | ICD-10-CM | POA: Diagnosis not present

## 2024-01-16 DIAGNOSIS — M6281 Muscle weakness (generalized): Secondary | ICD-10-CM

## 2024-01-16 DIAGNOSIS — M25562 Pain in left knee: Secondary | ICD-10-CM

## 2024-01-16 DIAGNOSIS — G8929 Other chronic pain: Secondary | ICD-10-CM

## 2024-01-16 NOTE — Therapy (Signed)
OUTPATIENT PHYSICAL THERAPY TREATMENT   Patient Name: Jose [name] MRN: [id number] DOB:03-01-1951, 73 y.o., male Today's Date: 01/16/2024  END OF SESSION:  PT End of Session - 01/16/24 1058     Visit Number 7    Number of Visits 20    Date for PT Re-Evaluation 02/13/24    PT Start Time 1055    PT Stop Time 1135    PT Time Calculation (min) 40 min    Activity Tolerance Patient tolerated treatment well    Behavior During Therapy WFL for tasks assessed/performed                Past Medical History:  Diagnosis Date   Arthritis    R shoulder, great toes, hands- has gout & has injections in knees with Dr. Corliss Skains    Bicuspid aortic valve with ascending aorta 4.0 to 4.5 cm in diameter    CAD (coronary artery disease)    Cancer (HCC)    skin ca-    GERD (gastroesophageal reflux disease)    Headache    History of hiatal hernia    Hypertension    Prostate cancer (HCC)    S/P TAVR (transcatheter aortic valve replacement)    SDH (subdural hematoma) (HCC)    Severe aortic stenosis    Thoracic ascending aortic aneurysm (HCC)    Vertebral artery aneurysm (HCC)    Past Surgical History:  Procedure Laterality Date   BRAIN SURGERY  2016   evacuation of SDH   CARDIAC CATHETERIZATION     CARPAL TUNNEL RELEASE Bilateral    embolization of left vertebral artery aneurysm with pipeline/coil  10/31/2022   GREEN LIGHT LASER TURP (TRANSURETHRAL RESECTION OF PROSTATE  04/27/2017   KNEE ARTHROPLASTY     LEFT HEART CATH AND CORONARY ANGIOGRAPHY N/A 10/08/2019   Procedure: LEFT HEART CATH AND CORONARY ANGIOGRAPHY;  Surgeon: Tonny Bollman, MD;  Location: [facility] INVASIVE CV LAB;  Service: Cardiovascular;  Laterality: N/A;   QUADRICEPS TENDON REPAIR Bilateral 06/15/2017   Procedure: REPAIR QUADRICEP TENDON;  Surgeon: Cammy Copa, MD;  Location: [facility] OR;  Service: Orthopedics;  Laterality: Bilateral;   SHOULDER SURGERY Right    TEE WITHOUT CARDIOVERSION N/A 07/03/2022   Procedure:  TRANSESOPHAGEAL ECHOCARDIOGRAM (TEE);  Surgeon: Wendall Stade, MD;  Location: [facility] ENDOSCOPY;  Service: Cardiovascular;  Laterality: N/A;   TRANSCATHETER AORTIC VALVE REPLACEMENT, TRANSFEMORAL  12/23/2019   TRANSCATHETER AORTIC VALVE REPLACEMENT, TRANSFEMORAL N/A 12/23/2019   Procedure: TRANSCATHETER AORTIC VALVE REPLACEMENT, TRANSFEMORAL;  Surgeon: Tonny Bollman, MD;  Location: [facility] OR;  Service: Open Heart Surgery;  Laterality: N/A;   VASCULAR SURGERY     Patient Active Problem List   Diagnosis Date Noted   Hypertension    Severe aortic stenosis 10/08/2019   Candidiasis    Abnormality of gait    Hypoalbuminemia due to protein-calorie malnutrition [facility])    History of gout    Sleep disturbance    Constipation    Rupture of quadriceps tendon, right, sequela 06/19/2017   Quadriceps tendon rupture, left, sequela 06/19/2017   Acute blood loss anemia 06/19/2017   Fall    History of subdural hematoma    Post-operative pain    Recurrent falls    Quadriceps tendon rupture 06/15/2017   Primary osteoarthritis of both knees 02/28/2017   Primary osteoarthritis of both hands 02/28/2017   Uricacidemia 02/28/2017   Pain in joint of left knee 02/07/2017   Idiopathic chronic gout, unspecified site, without tophus (tophi) 11/29/2016   HEMATURIA UNSPECIFIED 01/25/2009  Abdominal pain 01/25/2009   XERODERMA 03/10/2008   UNS ADVRS EFF UNS RX MEDICINAL&BIOLOGICAL SBSTNC 03/10/2008   ADJUSTMENT REACTION WITH PHYSICAL SYMPTOMS 02/14/2008   MUSCLE SPASM 02/14/2008   ACUTE PROSTATITIS 12/16/2007   BREAST LUMP OR MASS, RIGHT 07/12/2007   H/O: RCT (rotator cuff tear) 01/13/2007   GOUT 01/08/2007    PCP: Earleen Reaper, MD   REFERRING PROVIDER: Cammy Copa, MD   REFERRING DIAG:  [phone] (ICD-10-CM) - Synovitis of left knee  M19.011 (ICD-10-CM) - Primary osteoarthritis, right shoulder  M54.50 (ICD-10-CM) - Low back pain, unspecified back pain laterality, unspecified chronicity,  unspecified whether sciatica present    THERAPY DIAG:  Chronic right shoulder pain  Chronic pain of left knee  Other low back pain  Muscle weakness (generalized)  Rationale for Evaluation and Treatment: Rehabilitation  ONSET DATE: see subjective, on-going for years    PERTINENT HISTORY: HTN, severe aortic stenosis, abnormality of gait, h/o gout, rupture of quadriceps tendon on Rt 2018 on left 2018, subdural hematoma 2016, h/o falls, primary OA in bil hands Polio on Left side affecting Left UE strength  SUBJECTIVE:    SUBJECTIVE STATEMENT: He says some knee and back pain from standing in line over an hour at [facility].  EVAL:  Pt stating he has been painting a lot at home and feels it may have exacerbated his Rt shoulder. Pt reporting radiation pain down his arm but no numbness/tingling. Pt stating Rt shoulder is giving him the most problem. Pt stating after getting the second shingles shot his shoulder pain increased into tricep and shoulder. Pt stating pain has been ongoing for about a year. Pt stating pain is worse at night.  Pt stating left knee pain has been ongoing since January 2018 when he was skiing and twisted his knee and he tore his quadriceps tendon. Pt stating he fell and when he fell he tore both tendons in bil LE. Pt then stating the repair didn't take on his left LE and it was repaired again. Pt stating he had PT and he gained ROM back in bil LE.  Pt reporting falling while in hospital during a bed transfer where he landed on his floor on his back in 2018. Pt stating his back has bothered him on/off ever since.  Pt stating he hasn't been able to work out over the past few months due to recent move into [place].   PAIN:  NPRS scale: 4/10 back/knees today Pain description: achy, throbbing Aggravating factors: resting, tylenol, voltaren Relieving factors: certain movement  PRECAUTIONS: None  WEIGHT BEARING RESTRICTIONS: No  FALLS:  Has patient fallen in last 6  months? No falls in last few years. Haven't fell since he tore his quad tendons and during a hospital transfer several years ago  LIVING ENVIRONMENT: Lives with: lives with their family and lives with an adult companion Lives in: House/apartment Stairs: Yes: Internal: 17 steps; on left going up and External: 2 steps; on left going up Has following equipment at home: None  OCCUPATION: retired  PLOF: Independent  PATIENT GOALS: Be able to work out at gym with less pain  Next MD visit:   OBJECTIVE:   DIAGNOSTIC FINDINGS: Shoulder: 08/06/23  3 views of the right shoulder including Grashey, scapular Y and axial view  were ordered and reviewed by myself.  X-rays show severe osteoarthritis of  the right glenohumeral joint.  There is a large inferior spur off the  humeral head.  There is some chondrocalcinosis superior to the humeral  head, likely indicative of pseudogout or prior gout.  No acute bony  fracture noted.  There is bony sclerosis noted of the humeral head and  inferior acetabulum from likely arthritic change.   Knee 05/22/23:   1. Postsurgical changes of quadriceps tendon repair. The tendon appears intact. Resolution of the prior small fluid bright signal within the midsubstance of the quadriceps tendon insertion on prior 10/01/2018 MRI. 2. Stable chronic oblique tear of the anterior horn of the lateral meniscus. Mild degenerative undersurface fraying of the posterior horn of the lateral meniscus. 3. Interval worsening of moderate to severe patellofemoral cartilage degenerative changes. 4. Mildly worsened cartilage loss within the anteromedial aspect of the weight-bearing lateral femoral condyle and adjacent lateral tibial plateau. 5. Unchanged high-grade partial to full-thickness cartilage loss within the weight-bearing medial femoral condyle.  Lumbar: 05/22/23:  AP lateral radiographs lumbar spine reviewed.  Grade 1-2 spondylolisthesis present at L5-S1 with pars  fracture noted at the corresponding level.   Mild to moderate degenerative changes present in the upper and and middle lumbar spine region.  Visualized hips without arthritis    PATIENT SURVEYS:  12/05/23: FOTO intake:  49%  predicted:  51% 12/31/23: FOTO update 48%   COGNITION: Overall cognitive status: WFL    SENSATION: WFL   POSTURE:  rounded shoulders, forward head, and decreased lumbar lordosis  PALPATION: TTP: distal left quad tendon, Rt upper trap and levator and anterior shoulder  MMT   Right 12/04/23 Left 12/04/23 Rt  / Left 12/18/23 HHD ppsi  Hip flexion 5 5   Hip extension 5 5   Hip abduction 5 5   Hip adduction 5 5   Hip internal rotation     Hip external rotation     Knee flexion 5 5 52.5 / 40.0  Knee extension 5 5 54.4 / 65.5                   SHOULDER      Shoulder flexion 4 3   Shoulder abduction 4 3   Shoulder ER 4- 3   Shoulder IR 4- 3    (Blank rows = not tested)    ROM  LE Right 12/04/23 Left 12/04/23  Hip flexion 108 110  Hip extension    Hip abduction    Hip adduction    Hip internal rotation    Hip external rotation    Knee flexion 134 126  Knee extension 0 0   SHOULDER:      Shoulder flexion 138 158  Shoulder abduction 125 140  Shoulder ER 30 60   (Blank rows = not tested)    FUNCTIONAL TESTS:  12/04/23 : 5 time sit to stand: 17.2 seconds no UE support  GAIT: Distance walked: clinic distances, level surface Assistive device utilized: None Level of assistance: Complete Independence Comments: mild forward flexed trunk posture  TODAY'S TREATMENT                                                                           DATE: 01/16/24 TherEx:  Nustep L6 X 10 min UE/LE  Standing rows: blue TB 2 x 15  Standing shoulder extension blue TB 2 x 15   Standing wall trunk extension: x 5 holding 10 sec  Wall push ups x 15 Leg press: 87# 2 x 10, single LE: 50# x 10  Supine trunk rotation: x 10 bil sides holding 10 sec Bridges: 2 x 10 holding 5 sec Prone: hip extension: 2 x 10 bil LE holding 3 sec Prone elbow press ups x 10 holding 10 sec   DATE: 01/14/24 TherEx:  Nustep L6 X 10 min UE/LE  Standing rows: blue TB 2 x 15  Standing shoulder extension blue TB 2 x 15  Standing wall trunk extension: x 5 holding 10 sec  Wall push ups x 10  Leg press: 87# 2 x 10, single LE: 50# x 10  Supine trunk rotation: x 5 bil sides holding 20 sec Bridges: 2 x 10 holding 5 sec Prone: hip extension: 2 x 10 bil LE holding 3 sec Prone elbow press ups x 5 holding 10 sec Manual:  STM to QL on the left and left piriformis       TODAY'S TREATMENT                                                                           DATE: 12/31/23 TherEx:  Nu step L7 X 11 min UE/LE  BATCA rows: 35# 2 x 15 holding 3 sec (pt stating "twinge" in his left hip BATCA lat pull downs: 20# 2 x 10  Standing wall trunk extension: x 5 holding 10 sec  Wall push ups x 10  Leg press: 87# 2 x 10, single LE: 50# x 10  Standing hip flexor stretch: x 2 holding 30 seconds bil LE's Supine Bridge with clam shells: 2 x 10 green TB   PATIENT EDUCATION:  Education details: HEP, POC Person educated: Patient Education method: Programmer, multimedia, Facilities manager, Verbal cues, and Handouts Education comprehension: verbalized understanding, returned demonstration, and verbal cues required  HOME EXERCISE PROGRAM: Access Code: [email or web address] URL: [email or web address] Date: 12/05/2023 Prepared by: Narda Amber  Exercises - Standing Shoulder Row with Anchored Resistance  - 1 x daily - 7 x weekly - 3 sets - 10 reps - Supine Bridge  - 2 x daily - 7 x weekly - 2 sets - 10 reps - 5 seconds hold - Supine Shoulder External Rotation in 45 Degrees Abduction AAROM with Dowel  - 1 x daily - 7  x weekly - 3 sets - 10 reps - Supine Shoulder Flexion Extension AAROM with Dowel  - 2-3 x daily - 7 x weekly - 2 sets - 10 reps - Seated Small Alternating Straight Leg Lifts with Heel Touch  - 2 x daily -  7 x weekly - 15-20 reps - Recumbent Bike  - 2-3 x daily - 7 x weekly Clam shell Bridge: 2 x 10   ASSESSMENT:  CLINICAL IMPRESSION: He had good exercise tolerance to strength and stretching program today. He will continue to benefit form skilled PT to improve function.   OBJECTIVE IMPAIRMENTS: difficulty walking, decreased ROM, decreased strength, increased edema, impaired flexibility, impaired UE functional use, postural dysfunction, and pain.   ACTIVITY LIMITATIONS: lifting, bending, standing, squatting, sleeping, stairs, transfers, reach over head, and hygiene/grooming  PARTICIPATION LIMITATIONS: cleaning, community activity, and yard work  PERSONAL FACTORS: 3+ comorbidities: see pertinent history  are also affecting patient's functional outcome.   REHAB POTENTIAL: Good  CLINICAL DECISION MAKING: Evolving/moderate complexity  EVALUATION COMPLEXITY: High   GOALS: Goals reviewed with patient? Yes  SHORT TERM GOALS: (target date for Short term goals are 3 weeks 12/25/2023)   1.  Patient will demonstrate independent use of home exercise program to maintain progress from in clinic treatments.  Goal status:on-going 12/18/23  2. Pt to improve his 5 time sit to stand to </=  13  seconds with UE support.   Goal Status: New   LONG TERM GOALS: (target dates for all long term goals are 10 weeks  02/12/2024 )   1. Patient will demonstrate/report pain at worst less than or equal to 2/10 to facilitate minimal limitation in daily activity secondary to pain symptoms.  Goal status: New   2. Patient will demonstrate independent use of home exercise program to facilitate ability to maintain/progress functional gains from skilled physical therapy services.  Goal status: New   3. Patient  will demonstrate FOTO outcome > or = 51 % to indicate reduced disability due to condition.  Goal status: New   4.  Patient will be able to perform up and down 1 flight of stairs with reciprocal gait pattern with left knee pain and low back pain </= 2/10.   Goal status: New   5.  Patient will demonstrate improved Rt UE flexion to >/= 150 degrees in order to improve functional mobility.  Goal status: New   6.  Pt will improve Rt shoulder ER to >/= 60 deg in order to improve functional mobility.  Goal status: New      PLAN:  PT FREQUENCY: 1-2x/week  PT DURATION: 10 weeks  PLANNED INTERVENTIONS: Can include 16109- PT Re-evaluation, 97110-Therapeutic exercises, 97530- Therapeutic activity, [id number]- Neuromuscular re-education, 97535- Self Care, 97140- Manual therapy, [phone]- Gait training, [phone]- Orthotic Fit/training, [phone]- Canalith repositioning, [id number]- Aquatic Therapy, 97014- Electrical stimulation (unattended), [id number]- Electrical stimulation (manual), [id number]- Vasopneumatic device, [id number]- Ultrasound, [id number]- Traction (mechanical), [id number]- Ionotophoresis 4mg /ml Dexamethasone, Patient/Family education, Balance training, Stair training, Taping, Dry Needling, Joint mobilization, Joint manipulation, Spinal manipulation, Spinal mobilization, Scar mobilization, Vestibular training, Visual/preceptual remediation/compensation, DME instructions, Cryotherapy, and Moist heat.  All performed as medically necessary.  All included unless contraindicated  PLAN FOR NEXT SESSION:  shoulder and back exercises, overall strengthening of UEs and LEs, core, try percussion or STM as needed   April Manson, PT, DPT 01/16/2024, 11:51 AM

## 2024-01-22 ENCOUNTER — Encounter: Payer: Self-pay | Admitting: Physical Therapy

## 2024-01-22 ENCOUNTER — Ambulatory Visit (INDEPENDENT_AMBULATORY_CARE_PROVIDER_SITE_OTHER): Payer: Medicare Other | Admitting: Physical Therapy

## 2024-01-22 DIAGNOSIS — M25562 Pain in left knee: Secondary | ICD-10-CM

## 2024-01-22 DIAGNOSIS — M25511 Pain in right shoulder: Secondary | ICD-10-CM

## 2024-01-22 DIAGNOSIS — M6281 Muscle weakness (generalized): Secondary | ICD-10-CM | POA: Diagnosis not present

## 2024-01-22 DIAGNOSIS — M5459 Other low back pain: Secondary | ICD-10-CM

## 2024-01-22 DIAGNOSIS — G8929 Other chronic pain: Secondary | ICD-10-CM

## 2024-01-22 NOTE — Therapy (Signed)
 OUTPATIENT PHYSICAL THERAPY TREATMENT   Patient Name: Jose [name] MRN: [id number] DOB:1951/03/03, 73 y.o., male Today's Date: 01/22/2024  END OF SESSION:  PT End of Session - 01/22/24 1433     Visit Number 8    Number of Visits 20    Date for PT Re-Evaluation 02/13/24    PT Start Time 1348    PT Stop Time 1430    PT Time Calculation (min) 42 min    Activity Tolerance Patient tolerated treatment well    Behavior During Therapy WFL for tasks assessed/performed                 Past Medical History:  Diagnosis Date   Arthritis    R shoulder, great toes, hands- has gout & has injections in knees with Dr. [name]    Bicuspid aortic valve with ascending aorta 4.0 to 4.5 cm in diameter    CAD (coronary artery disease)    Cancer (HCC)    skin ca-    GERD (gastroesophageal reflux disease)    Headache    History of hiatal hernia    Hypertension    Prostate cancer (HCC)    S/P TAVR (transcatheter aortic valve replacement)    SDH (subdural hematoma) (HCC)    Severe aortic stenosis    Thoracic ascending aortic aneurysm (HCC)    Vertebral artery aneurysm (HCC)    Past Surgical History:  Procedure Laterality Date   BRAIN SURGERY  2016   evacuation of SDH   CARDIAC CATHETERIZATION     CARPAL TUNNEL RELEASE Bilateral    embolization of left vertebral artery aneurysm with pipeline/coil  10/31/2022   GREEN LIGHT LASER TURP (TRANSURETHRAL RESECTION OF PROSTATE  04/27/2017   KNEE ARTHROPLASTY     LEFT HEART CATH AND CORONARY ANGIOGRAPHY N/A 10/08/2019   Procedure: LEFT HEART CATH AND CORONARY ANGIOGRAPHY;  Surgeon: Wonda Sharper, MD;  Location: [facility] INVASIVE CV LAB;  Service: Cardiovascular;  Laterality: N/A;   QUADRICEPS TENDON REPAIR Bilateral 06/15/2017   Procedure: REPAIR QUADRICEP TENDON;  Surgeon: Addie Cordella Glendia, MD;  Location: [facility] OR;  Service: Orthopedics;  Laterality: Bilateral;   SHOULDER SURGERY Right    TEE WITHOUT CARDIOVERSION N/A 07/03/2022    Procedure: TRANSESOPHAGEAL ECHOCARDIOGRAM (TEE);  Surgeon: Delford Maude BROCKS, MD;  Location: [facility] ENDOSCOPY;  Service: Cardiovascular;  Laterality: N/A;   TRANSCATHETER AORTIC VALVE REPLACEMENT, TRANSFEMORAL  12/23/2019   TRANSCATHETER AORTIC VALVE REPLACEMENT, TRANSFEMORAL N/A 12/23/2019   Procedure: TRANSCATHETER AORTIC VALVE REPLACEMENT, TRANSFEMORAL;  Surgeon: Wonda Sharper, MD;  Location: [facility] OR;  Service: Open Heart Surgery;  Laterality: N/A;   VASCULAR SURGERY     Patient Active Problem List   Diagnosis Date Noted   Hypertension    Severe aortic stenosis 10/08/2019   Candidiasis    Abnormality of gait    Hypoalbuminemia due to protein-calorie malnutrition [facility])    History of gout    Sleep disturbance    Constipation    Rupture of quadriceps tendon, right, sequela 06/19/2017   Quadriceps tendon rupture, left, sequela 06/19/2017   Acute blood loss anemia 06/19/2017   Fall    History of subdural hematoma    Post-operative pain    Recurrent falls    Quadriceps tendon rupture 06/15/2017   Primary osteoarthritis of both knees 02/28/2017   Primary osteoarthritis of both hands 02/28/2017   Uricacidemia 02/28/2017   Pain in joint of left knee 02/07/2017   Idiopathic chronic gout, unspecified site, without tophus (tophi) 11/29/2016   HEMATURIA UNSPECIFIED  01/25/2009   Abdominal pain 01/25/2009   XERODERMA 03/10/2008   UNS ADVRS EFF UNS RX MEDICINAL&BIOLOGICAL SBSTNC 03/10/2008   ADJUSTMENT REACTION WITH PHYSICAL SYMPTOMS 02/14/2008   MUSCLE SPASM 02/14/2008   ACUTE PROSTATITIS 12/16/2007   BREAST LUMP OR MASS, RIGHT 07/12/2007   H/O: RCT (rotator cuff tear) 01/13/2007   GOUT 01/08/2007    PCP: Birda Ahumada, MD   REFERRING PROVIDER: Addie Cordella Hamilton, MD   REFERRING DIAG:  [phone] (ICD-10-CM) - Synovitis of left knee  M19.011 (ICD-10-CM) - Primary osteoarthritis, right shoulder  M54.50 (ICD-10-CM) - Low back pain, unspecified back pain laterality, unspecified  chronicity, unspecified whether sciatica present    THERAPY DIAG:  Chronic right shoulder pain  Chronic pain of left knee  Other low back pain  Muscle weakness (generalized)  Rationale for Evaluation and Treatment: Rehabilitation  ONSET DATE: see subjective, on-going for years    PERTINENT HISTORY: HTN, severe aortic stenosis, abnormality of gait, h/o gout, rupture of quadriceps tendon on Rt 2018 on left 2018, subdural hematoma 2016, h/o falls, primary OA in bil hands Polio on Left side affecting Left UE strength  SUBJECTIVE:    SUBJECTIVE STATEMENT: Pt stating since his last visit he has been to gym 4 times. Pt reporting more swelling in his left knee and soreness in his Rt shoulder.     EVAL:  Pt stating he has been painting a lot at home and feels it may have exacerbated his Rt shoulder. Pt reporting radiation pain down his arm but no numbness/tingling. Pt stating Rt shoulder is giving him the most problem. Pt stating after getting the second shingles shot his shoulder pain increased into tricep and shoulder. Pt stating pain has been ongoing for about a year. Pt stating pain is worse at night.  Pt stating left knee pain has been ongoing since January 2018 when he was skiing and twisted his knee and he tore his quadriceps tendon. Pt stating he fell and when he fell he tore both tendons in bil LE. Pt then stating the repair didn't take on his left LE and it was repaired again. Pt stating he had PT and he gained ROM back in bil LE.  Pt reporting falling while in hospital during a bed transfer where he landed on his floor on his back in 2018. Pt stating his back has bothered him on/off ever since.  Pt stating he hasn't been able to work out over the past few months due to recent move into [place].   PAIN:  NPRS scale: 2/10 today Pain description: achy, throbbing Aggravating factors: resting, tylenol , voltaren  Relieving factors: certain movement  PRECAUTIONS:  None  WEIGHT BEARING RESTRICTIONS: No  FALLS:  Has patient fallen in last 6 months? No falls in last few years. Haven't fell since he tore his quad tendons and during a hospital transfer several years ago  LIVING ENVIRONMENT: Lives with: lives with their family and lives with an adult companion Lives in: House/apartment Stairs: Yes: Internal: 17 steps; on left going up and External: 2 steps; on left going up Has following equipment at home: None  OCCUPATION: retired  PLOF: Independent  PATIENT GOALS: Be able to work out at gym with less pain  Next MD visit:   OBJECTIVE:   DIAGNOSTIC FINDINGS: Shoulder: 08/06/23  3 views of the right shoulder including Grashey, scapular Y and axial view  were ordered and reviewed by myself.  X-rays show severe osteoarthritis of  the right glenohumeral joint.  There is a large  inferior spur off the  humeral head.  There is some chondrocalcinosis superior to the humeral  head, likely indicative of pseudogout or prior gout.  No acute bony  fracture noted.  There is bony sclerosis noted of the humeral head and  inferior acetabulum from likely arthritic change.   Knee 05/22/23:   1. Postsurgical changes of quadriceps tendon repair. The tendon appears intact. Resolution of the prior small fluid bright signal within the midsubstance of the quadriceps tendon insertion on prior 10/01/2018 MRI. 2. Stable chronic oblique tear of the anterior horn of the lateral meniscus. Mild degenerative undersurface fraying of the posterior horn of the lateral meniscus. 3. Interval worsening of moderate to severe patellofemoral cartilage degenerative changes. 4. Mildly worsened cartilage loss within the anteromedial aspect of the weight-bearing lateral femoral condyle and adjacent lateral tibial plateau. 5. Unchanged high-grade partial to full-thickness cartilage loss within the weight-bearing medial femoral condyle.  Lumbar: 05/22/23:  AP lateral radiographs  lumbar spine reviewed.  Grade 1-2 spondylolisthesis present at L5-S1 with pars fracture noted at the corresponding level.   Mild to moderate degenerative changes present in the upper and and middle lumbar spine region.  Visualized hips without arthritis    PATIENT SURVEYS:  12/05/23: FOTO intake:  49%  predicted:  51% 12/31/23: FOTO update 48%   COGNITION: Overall cognitive status: WFL    SENSATION: WFL   POSTURE:  rounded shoulders, forward head, and decreased lumbar lordosis  PALPATION: TTP: distal left quad tendon, Rt upper trap and levator and anterior shoulder  MMT   Right 12/04/23 Left 12/04/23 Rt  / Left 12/18/23 HHD ppsi  Hip flexion 5 5   Hip extension 5 5   Hip abduction 5 5   Hip adduction 5 5   Hip internal rotation     Hip external rotation     Knee flexion 5 5 52.5 / 40.0  Knee extension 5 5 54.4 / 65.5                   SHOULDER      Shoulder flexion 4 3   Shoulder abduction 4 3   Shoulder ER 4- 3   Shoulder IR 4- 3    (Blank rows = not tested)    ROM  LE Right 12/04/23 Left 12/04/23 Rt  01/22/24 Left 01/22/24 Active supine  Hip flexion 108 110 112 112  Hip extension      Hip abduction      Hip adduction      Hip internal rotation      Hip external rotation      Knee flexion 134 126    Knee extension 0 0     SHOULDER:        Shoulder flexion 138 158 145 155  Shoulder abduction 125 140    Shoulder ER 30 60     (Blank rows = not tested)    FUNCTIONAL TESTS:  12/04/23 : 5 time sit to stand: 17.2 seconds no UE support 01/22/24: 5 time sit to stand: 9.2 seconds no UE support  GAIT: Distance walked: clinic distances, level surface Assistive device utilized: None Level of assistance: Complete Independence Comments: mild forward flexed trunk posture  TODAY'S  TREATMENT                                                                           DATE: 01/22/24 TherEx:  Nustep L6 X 10 min UE/LE  Standing rows: blue TB 2 x 15  Standing wall trunk extension: x 5 holding 10 sec  Prone: hip extension x 10 bil LE Quadraped: hip extension c verbal and  Sit to stand: 2 x 5 (best time: 9.2 seconds no UE support) Rows: level 4 band: 2 x 10 holding 5 sec Shoulder extension: level 4 2 x 10  BATCA: wood crop 5#  x 10 bil directions  Updated ROM for shoulder and hips see chart above       TODAY'S TREATMENT                                                                           DATE: 01/16/24 TherEx:  Nustep L6 X 10 min UE/LE  Standing rows: blue TB 2 x 15  Standing shoulder extension blue TB 2 x 15  Standing wall trunk extension: x 5 holding 10 sec  Wall push ups x 15 Leg press: 87# 2 x 10, single LE: 50# x 10  Supine trunk rotation: x 10 bil sides holding 10 sec Bridges: 2 x 10 holding 5 sec Prone: hip extension: 2 x 10 bil LE holding 3 sec Prone elbow press ups x 10 holding 10 sec   DATE: 01/14/24 TherEx:  Nustep L6 X 10 min UE/LE  Standing rows: blue TB 2 x 15  Standing shoulder extension blue TB 2 x 15  Standing wall trunk extension: x 5 holding 10 sec  Wall push ups x 10  Leg press: 87# 2 x 10, single LE: 50# x 10  Supine trunk rotation: x 5 bil sides holding 20 sec Bridges: 2 x 10 holding 5 sec Prone: hip extension: 2 x 10 bil LE holding 3 sec Prone elbow press ups x 5 holding 10 sec Manual:  STM to QL on the left and left piriformis        PATIENT EDUCATION:  Education details: HEP, POC Person educated: Patient Education method: Programmer, Multimedia, Demonstration, Verbal cues, and Handouts Education comprehension: verbalized understanding, returned demonstration, and verbal cues required  HOME EXERCISE PROGRAM: Access Code: [email or web address] URL: [email or web address] Date: 12/05/2023 Prepared by: Delon Lunger  Exercises - Standing Shoulder Row with Anchored Resistance  - 1 x daily - 7 x weekly - 3 sets - 10 reps - Supine Bridge  - 2 x daily - 7 x weekly - 2 sets - 10 reps - 5 seconds hold - Supine Shoulder External Rotation in 45 Degrees Abduction AAROM with Dowel  - 1 x daily - 7 x weekly - 3 sets - 10 reps - Supine Shoulder Flexion Extension AAROM with Dowel  - 2-3 x daily - 7 x weekly - 2 sets - 10 reps - Seated Small  Alternating Straight Leg Lifts with Heel Touch  - 2 x daily - 7 x weekly - 15-20 reps - Recumbent Bike  - 2-3 x daily - 7 x weekly Clam shell Bridge: 2 x 10   ASSESSMENT:  CLINICAL IMPRESSION: Pt tolerating exercises well continue to progress LE strengthening and core strengthening. Pt encouraged to continue to perform gradual workout progressions at his gym. Recommending continued skilled PT.    OBJECTIVE IMPAIRMENTS: difficulty walking, decreased ROM, decreased strength, increased edema, impaired flexibility, impaired UE functional use, postural dysfunction, and pain.   ACTIVITY LIMITATIONS: lifting, bending, standing, squatting, sleeping, stairs, transfers, reach over head, and hygiene/grooming  PARTICIPATION LIMITATIONS: cleaning, community activity, and yard work  PERSONAL FACTORS: 3+ comorbidities: see pertinent history  are also affecting patient's functional outcome.   REHAB POTENTIAL: Good  CLINICAL DECISION MAKING: Evolving/moderate complexity  EVALUATION COMPLEXITY: High   GOALS: Goals reviewed with patient? Yes  SHORT TERM GOALS: (target date for Short term goals are 3 weeks 12/25/2023)   1.  Patient will demonstrate independent use of home exercise program to maintain progress from in clinic treatments.  Goal status:on-going 12/18/23  2. Pt to improve his 5 time sit to stand to </=  13  seconds with UE support.   Goal Status: New   LONG TERM GOALS: (target dates for all long term goals are 10 weeks  02/12/2024 )   1. Patient will  demonstrate/report pain at worst less than or equal to 2/10 to facilitate minimal limitation in daily activity secondary to pain symptoms.  Goal status: New   2. Patient will demonstrate independent use of home exercise program to facilitate ability to maintain/progress functional gains from skilled physical therapy services.  Goal status: New   3. Patient will demonstrate FOTO outcome > or = 51 % to indicate reduced disability due to condition.  Goal status: New   4.  Patient will be able to perform up and down 1 flight of stairs with reciprocal gait pattern with left knee pain and low back pain </= 2/10.   Goal status: New   5.  Patient will demonstrate improved Rt UE flexion to >/= 150 degrees in order to improve functional mobility.  Goal status: New   6.  Pt will improve Rt shoulder ER to >/= 60 deg in order to improve functional mobility.  Goal status: New      PLAN:  PT FREQUENCY: 1-2x/week  PT DURATION: 10 weeks  PLANNED INTERVENTIONS: Can include 02853- PT Re-evaluation, 97110-Therapeutic exercises, 97530- Therapeutic activity, [id number]- Neuromuscular re-education, 97535- Self Care, 97140- Manual therapy, [phone]- Gait training, [phone]- Orthotic Fit/training, [phone]- Canalith repositioning, [id number]- Aquatic Therapy, 97014- Electrical stimulation (unattended), [id number]- Electrical stimulation (manual), [id number]- Vasopneumatic device, [id number]- Ultrasound, [id number]- Traction (mechanical), [id number]- Ionotophoresis 4mg /ml Dexamethasone , Patient/Family education, Balance training, Stair training, Taping, Dry Needling, Joint mobilization, Joint manipulation, Spinal manipulation, Spinal mobilization, Scar mobilization, Vestibular training, Visual/preceptual remediation/compensation, DME instructions, Cryotherapy, and Moist heat.  All performed as medically necessary.  All included unless contraindicated  PLAN FOR NEXT SESSION:  shoulder and back exercises, overall strengthening of UEs and LEs, core, try  percussion or STM as needed   Delon JONELLE Lunger, PT, MPT 01/22/2024, 2:34 PM

## 2024-01-28 ENCOUNTER — Encounter: Payer: Medicare Other | Admitting: Physical Therapy

## 2024-01-30 ENCOUNTER — Ambulatory Visit (INDEPENDENT_AMBULATORY_CARE_PROVIDER_SITE_OTHER): Payer: Medicare Other | Admitting: Physical Therapy

## 2024-01-30 ENCOUNTER — Encounter: Payer: Self-pay | Admitting: Physical Therapy

## 2024-01-30 DIAGNOSIS — M25562 Pain in left knee: Secondary | ICD-10-CM | POA: Diagnosis not present

## 2024-01-30 DIAGNOSIS — M6281 Muscle weakness (generalized): Secondary | ICD-10-CM

## 2024-01-30 DIAGNOSIS — M25511 Pain in right shoulder: Secondary | ICD-10-CM

## 2024-01-30 DIAGNOSIS — G8929 Other chronic pain: Secondary | ICD-10-CM

## 2024-01-30 DIAGNOSIS — M5459 Other low back pain: Secondary | ICD-10-CM | POA: Diagnosis not present

## 2024-01-30 NOTE — Therapy (Signed)
OUTPATIENT PHYSICAL THERAPY TREATMENT   Patient Name: Jose [name] MRN: [id number] DOB:[birth year], 73 y.o., male Today's Date: 01/30/2024  END OF SESSION:  PT End of Session - 01/30/24 1310     Visit Number 9    Number of Visits 20    Date for PT Re-Evaluation 02/13/24    PT Start Time 1304    PT Stop Time 1345    PT Time Calculation (min) 41 min    Activity Tolerance Patient tolerated treatment well    Behavior During Therapy WFL for tasks assessed/performed                 Past Medical History:  Diagnosis Date   Arthritis    R shoulder, great toes, hands- has gout & has injections in knees with Dr. Corliss Skains    Bicuspid aortic valve with ascending aorta 4.0 to 4.5 cm in diameter    CAD (coronary artery disease)    Cancer (HCC)    skin ca-    GERD (gastroesophageal reflux disease)    Headache    History of hiatal hernia    Hypertension    Prostate cancer (HCC)    S/P TAVR (transcatheter aortic valve replacement)    SDH (subdural hematoma) (HCC)    Severe aortic stenosis    Thoracic ascending aortic aneurysm (HCC)    Vertebral artery aneurysm (HCC)    Past Surgical History:  Procedure Laterality Date   BRAIN SURGERY  2016   evacuation of SDH   CARDIAC CATHETERIZATION     CARPAL TUNNEL RELEASE Bilateral    embolization of left vertebral artery aneurysm with pipeline/coil  10/31/2022   GREEN LIGHT LASER TURP (TRANSURETHRAL RESECTION OF PROSTATE  04/27/2017   KNEE ARTHROPLASTY     LEFT HEART CATH AND CORONARY ANGIOGRAPHY N/A 10/08/2019   Procedure: LEFT HEART CATH AND CORONARY ANGIOGRAPHY;  Surgeon: Tonny Bollman, MD;  Location: [facility] INVASIVE CV LAB;  Service: Cardiovascular;  Laterality: N/A;   QUADRICEPS TENDON REPAIR Bilateral 06/15/2017   Procedure: REPAIR QUADRICEP TENDON;  Surgeon: Cammy Copa, MD;  Location: [facility] OR;  Service: Orthopedics;  Laterality: Bilateral;   SHOULDER SURGERY Right    TEE WITHOUT CARDIOVERSION N/A 07/03/2022    Procedure: TRANSESOPHAGEAL ECHOCARDIOGRAM (TEE);  Surgeon: Wendall Stade, MD;  Location: [facility] ENDOSCOPY;  Service: Cardiovascular;  Laterality: N/A;   TRANSCATHETER AORTIC VALVE REPLACEMENT, TRANSFEMORAL  12/23/2019   TRANSCATHETER AORTIC VALVE REPLACEMENT, TRANSFEMORAL N/A 12/23/2019   Procedure: TRANSCATHETER AORTIC VALVE REPLACEMENT, TRANSFEMORAL;  Surgeon: Tonny Bollman, MD;  Location: [facility] OR;  Service: Open Heart Surgery;  Laterality: N/A;   VASCULAR SURGERY     Patient Active Problem List   Diagnosis Date Noted   Hypertension    Severe aortic stenosis 10/08/2019   Candidiasis    Abnormality of gait    Hypoalbuminemia due to protein-calorie malnutrition [facility])    History of gout    Sleep disturbance    Constipation    Rupture of quadriceps tendon, right, sequela 06/19/2017   Quadriceps tendon rupture, left, sequela 06/19/2017   Acute blood loss anemia 06/19/2017   Fall    History of subdural hematoma    Post-operative pain    Recurrent falls    Quadriceps tendon rupture 06/15/2017   Primary osteoarthritis of both knees 02/28/2017   Primary osteoarthritis of both hands 02/28/2017   Uricacidemia 02/28/2017   Pain in joint of left knee 02/07/2017   Idiopathic chronic gout, unspecified site, without tophus (tophi) 11/29/2016   HEMATURIA UNSPECIFIED  01/25/2009   Abdominal pain 01/25/2009   XERODERMA 03/10/2008   UNS ADVRS EFF UNS RX MEDICINAL&BIOLOGICAL SBSTNC 03/10/2008   ADJUSTMENT REACTION WITH PHYSICAL SYMPTOMS 02/14/2008   MUSCLE SPASM 02/14/2008   ACUTE PROSTATITIS 12/16/2007   BREAST LUMP OR MASS, RIGHT 07/12/2007   H/O: RCT (rotator cuff tear) 01/13/2007   GOUT 01/08/2007    PCP: Earleen Reaper, MD   REFERRING PROVIDER: Cammy Copa, MD   REFERRING DIAG:  [phone] (ICD-10-CM) - Synovitis of left knee  M19.011 (ICD-10-CM) - Primary osteoarthritis, right shoulder  M54.50 (ICD-10-CM) - Low back pain, unspecified back pain laterality, unspecified  chronicity, unspecified whether sciatica present    THERAPY DIAG:  Chronic right shoulder pain  Chronic pain of left knee  Other low back pain  Muscle weakness (generalized)  Rationale for Evaluation and Treatment: Rehabilitation  ONSET DATE: see subjective, on-going for years    PERTINENT HISTORY: HTN, severe aortic stenosis, abnormality of gait, h/o gout, rupture of quadriceps tendon on Rt 2018 on left 2018, subdural hematoma 2016, h/o falls, primary OA in bil hands Polio on Left side affecting Left UE strength  SUBJECTIVE:    SUBJECTIVE STATEMENT: Relays the pain is good today overall    EVAL:  Pt stating he has been painting a lot at home and feels it may have exacerbated his Rt shoulder. Pt reporting radiation pain down his arm but no numbness/tingling. Pt stating Rt shoulder is giving him the most problem. Pt stating after getting the second shingles shot his shoulder pain increased into tricep and shoulder. Pt stating pain has been ongoing for about a year. Pt stating pain is worse at night.  Pt stating left knee pain has been ongoing since January 2018 when he was skiing and twisted his knee and he tore his quadriceps tendon. Pt stating he fell and when he fell he tore both tendons in bil LE. Pt then stating the repair didn't take on his left LE and it was repaired again. Pt stating he had PT and he gained ROM back in bil LE.  Pt reporting falling while in hospital during a bed transfer where he landed on his floor on his back in 2018. Pt stating his back has bothered him on/off ever since.  Pt stating he hasn't been able to work out over the past few months due to recent move into [place].   PAIN:  NPRS scale: 2/10 today Pain description: achy, throbbing Aggravating factors: resting, tylenol, voltaren Relieving factors: certain movement  PRECAUTIONS: None  WEIGHT BEARING RESTRICTIONS: No  FALLS:  Has patient fallen in last 6 months? No falls in last few  years. Haven't fell since he tore his quad tendons and during a hospital transfer several years ago  LIVING ENVIRONMENT: Lives with: lives with their family and lives with an adult companion Lives in: House/apartment Stairs: Yes: Internal: 17 steps; on left going up and External: 2 steps; on left going up Has following equipment at home: None  OCCUPATION: retired  PLOF: Independent  PATIENT GOALS: Be able to work out at gym with less pain  Next MD visit:   OBJECTIVE:   DIAGNOSTIC FINDINGS: Shoulder: 08/06/23  3 views of the right shoulder including Grashey, scapular Y and axial view  were ordered and reviewed by myself.  X-rays show severe osteoarthritis of  the right glenohumeral joint.  There is a large inferior spur off the  humeral head.  There is some chondrocalcinosis superior to the humeral  head, likely indicative of  pseudogout or prior gout.  No acute bony  fracture noted.  There is bony sclerosis noted of the humeral head and  inferior acetabulum from likely arthritic change.   Knee 05/22/23:   1. Postsurgical changes of quadriceps tendon repair. The tendon appears intact. Resolution of the prior small fluid bright signal within the midsubstance of the quadriceps tendon insertion on prior 10/01/2018 MRI. 2. Stable chronic oblique tear of the anterior horn of the lateral meniscus. Mild degenerative undersurface fraying of the posterior horn of the lateral meniscus. 3. Interval worsening of moderate to severe patellofemoral cartilage degenerative changes. 4. Mildly worsened cartilage loss within the anteromedial aspect of the weight-bearing lateral femoral condyle and adjacent lateral tibial plateau. 5. Unchanged high-grade partial to full-thickness cartilage loss within the weight-bearing medial femoral condyle.  Lumbar: 05/22/23:  AP lateral radiographs lumbar spine reviewed.  Grade 1-2 spondylolisthesis present at L5-S1 with pars fracture noted at the  corresponding level.   Mild to moderate degenerative changes present in the upper and and middle lumbar spine region.  Visualized hips without arthritis    PATIENT SURVEYS:  12/05/23: FOTO intake:  49%  predicted:  51% 12/31/23: FOTO update 48%   COGNITION: Overall cognitive status: WFL    SENSATION: WFL   POSTURE:  rounded shoulders, forward head, and decreased lumbar lordosis  PALPATION: TTP: distal left quad tendon, Rt upper trap and levator and anterior shoulder  MMT   Right 12/04/23 Left 12/04/23 Rt  / Left 12/18/23 HHD ppsi  Hip flexion 5 5   Hip extension 5 5   Hip abduction 5 5   Hip adduction 5 5   Hip internal rotation     Hip external rotation     Knee flexion 5 5 52.5 / 40.0  Knee extension 5 5 54.4 / 65.5                   SHOULDER      Shoulder flexion 4 3   Shoulder abduction 4 3   Shoulder ER 4- 3   Shoulder IR 4- 3    (Blank rows = not tested)    ROM  LE Right 12/04/23 Left 12/04/23 Rt  01/22/24 Left 01/22/24 Active supine  Hip flexion 108 110 112 112  Hip extension      Hip abduction      Hip adduction      Hip internal rotation      Hip external rotation      Knee flexion 134 126    Knee extension 0 0     SHOULDER:        Shoulder flexion 138 158 145 155  Shoulder abduction 125 140    Shoulder ER 30 60     (Blank rows = not tested)    FUNCTIONAL TESTS:  12/04/23 : 5 time sit to stand: 17.2 seconds no UE support 01/22/24: 5 time sit to stand: 9.2 seconds no UE support  GAIT: Distance walked: clinic distances, level surface Assistive device utilized: None Level of assistance: Complete Independence Comments: mild forward flexed trunk posture  TODAY'S TREATMENT                                                                           DATE: 01/30/24 TherEx:   Supine low trunk rotations 5 sec X 10 bilat Supine SKTC stretch  Sci fit UE/LE L5.2 for 10  min Prone: hip extension x 10 bil LE Quadraped: hip extension c verbal and  Theractivity Rows: BATCA 10# each side 2X15 bilat Shoulder extension:BATCA 10# each side 2X15 bilat BATCA: wood crop 10#  x 15 bil directions BATCA anti rotation press 5# 2 X 15 bilat Leg press: 93# 2 x 15, single LE: 56# 2 x 15  DATE: 01/22/24 TherEx:  Nustep L6 X 10 min UE/LE  Standing rows: blue TB 2 x 15  Standing wall trunk extension: x 5 holding 10 sec  Prone: hip extension x 10 bil LE Quadraped: hip extension c verbal and  Sit to stand: 2 x 5 (best time: 9.2 seconds no UE support) Rows: level 4 band: 2 x 10 holding 5 sec Shoulder extension: level 4 2 x 10  BATCA: wood crop 5#  x 10 bil directions  Updated ROM for shoulder and hips see chart above   TODAY'S TREATMENT                                                                           DATE: 01/16/24 TherEx:  Nustep L6 X 10 min UE/LE  Standing rows: blue TB 2 x 15  Standing shoulder extension blue TB 2 x 15  Standing wall trunk extension: x 5 holding 10 sec  Wall push ups x 15 Leg press: 87# 2 x 10, single LE: 50# x 10  Supine trunk rotation: x 10 bil sides holding 10 sec Bridges: 2 x 10 holding 5 sec Prone: hip extension: 2 x 10 bil LE holding 3 sec Prone elbow press ups x 10 holding 10 sec     PATIENT EDUCATION:  Education details: HEP, POC Person educated: Patient Education method: Programmer, multimedia, Demonstration, Verbal cues, and Handouts Education comprehension: verbalized understanding, returned demonstration, and verbal cues required  HOME EXERCISE PROGRAM: Access Code: [email or web address] URL: [email or web address] Date: 12/05/2023 Prepared by: Narda Amber  Exercises - Standing Shoulder Row with Anchored Resistance  - 1 x daily - 7 x weekly - 3 sets - 10 reps - Supine Bridge  - 2 x daily - 7 x weekly - 2 sets - 10 reps - 5  seconds hold - Supine Shoulder External Rotation in 45 Degrees Abduction AAROM with Dowel  - 1 x daily - 7 x weekly - 3 sets - 10 reps - Supine Shoulder Flexion Extension AAROM with Dowel  - 2-3 x daily - 7 x weekly - 2 sets - 10 reps - Seated Small Alternating Straight Leg Lifts with Heel Touch  - 2 x daily - 7 x weekly - 15-20 reps - Recumbent Bike  - 2-3 x daily -  7 x weekly Clam shell Bridge: 2 x 10   ASSESSMENT:  CLINICAL IMPRESSION: Progressed his core strength program with more cable machine work that he could replicate at gym. His pain was doing good today and he had good tolerance to the strength progressions.   OBJECTIVE IMPAIRMENTS: difficulty walking, decreased ROM, decreased strength, increased edema, impaired flexibility, impaired UE functional use, postural dysfunction, and pain.   ACTIVITY LIMITATIONS: lifting, bending, standing, squatting, sleeping, stairs, transfers, reach over head, and hygiene/grooming  PARTICIPATION LIMITATIONS: cleaning, community activity, and yard work  PERSONAL FACTORS: 3+ comorbidities: see pertinent history  are also affecting patient's functional outcome.   REHAB POTENTIAL: Good  CLINICAL DECISION MAKING: Evolving/moderate complexity  EVALUATION COMPLEXITY: High   GOALS: Goals reviewed with patient? Yes  SHORT TERM GOALS: (target date for Short term goals are 3 weeks 12/25/2023)   1.  Patient will demonstrate independent use of home exercise program to maintain progress from in clinic treatments.  Goal status:on-going 12/18/23  2. Pt to improve his 5 time sit to stand to </=  13  seconds with UE support.   Goal Status: New   LONG TERM GOALS: (target dates for all long term goals are 10 weeks  02/12/2024 )   1. Patient will demonstrate/report pain at worst less than or equal to 2/10 to facilitate minimal limitation in daily activity secondary to pain symptoms.  Goal status: New   2. Patient will demonstrate independent use of  home exercise program to facilitate ability to maintain/progress functional gains from skilled physical therapy services.  Goal status: New   3. Patient will demonstrate FOTO outcome > or = 51 % to indicate reduced disability due to condition.  Goal status: New   4.  Patient will be able to perform up and down 1 flight of stairs with reciprocal gait pattern with left knee pain and low back pain </= 2/10.   Goal status: New   5.  Patient will demonstrate improved Rt UE flexion to >/= 150 degrees in order to improve functional mobility.  Goal status: New   6.  Pt will improve Rt shoulder ER to >/= 60 deg in order to improve functional mobility.  Goal status: New      PLAN:  PT FREQUENCY: 1-2x/week  PT DURATION: 10 weeks  PLANNED INTERVENTIONS: Can include 16109- PT Re-evaluation, 97110-Therapeutic exercises, 97530- Therapeutic activity, [id number]- Neuromuscular re-education, 97535- Self Care, 97140- Manual therapy, [phone]- Gait training, [phone]- Orthotic Fit/training, [phone]- Canalith repositioning, [id number]- Aquatic Therapy, 97014- Electrical stimulation (unattended), [id number]- Electrical stimulation (manual), [id number]- Vasopneumatic device, [id number]- Ultrasound, [id number]- Traction (mechanical), [id number]- Ionotophoresis 4mg /ml Dexamethasone, Patient/Family education, Balance training, Stair training, Taping, Dry Needling, Joint mobilization, Joint manipulation, Spinal manipulation, Spinal mobilization, Scar mobilization, Vestibular training, Visual/preceptual remediation/compensation, DME instructions, Cryotherapy, and Moist heat.  All performed as medically necessary.  All included unless contraindicated  PLAN FOR NEXT SESSION:  shoulder and back exercises, overall strengthening of UEs and LEs, core, try percussion or STM as needed   April Manson, PT, DPT 01/30/2024, 1:12 PM

## 2024-02-04 ENCOUNTER — Encounter: Payer: Medicare Other | Admitting: Physical Therapy

## 2024-02-05 ENCOUNTER — Encounter: Payer: Self-pay | Admitting: Sports Medicine

## 2024-02-06 ENCOUNTER — Encounter: Payer: Medicare Other | Admitting: Rehabilitative and Restorative Service Providers"

## 2024-02-15 ENCOUNTER — Other Ambulatory Visit: Payer: Self-pay

## 2024-02-15 ENCOUNTER — Ambulatory Visit (INDEPENDENT_AMBULATORY_CARE_PROVIDER_SITE_OTHER): Payer: Medicare Other | Admitting: Orthopedic Surgery

## 2024-02-15 DIAGNOSIS — M79605 Pain in left leg: Secondary | ICD-10-CM

## 2024-02-15 DIAGNOSIS — M545 Low back pain, unspecified: Secondary | ICD-10-CM | POA: Diagnosis not present

## 2024-02-15 DIAGNOSIS — M19011 Primary osteoarthritis, right shoulder: Secondary | ICD-10-CM

## 2024-02-15 MED ORDER — TRAMADOL HCL 50 MG PO TABS: 50.0000 mg | ORAL_TABLET | Freq: Two times a day (BID) | ORAL | 0 refills | Status: DC | PRN

## 2024-02-16 ENCOUNTER — Encounter: Payer: Self-pay | Admitting: Orthopedic Surgery

## 2024-02-16 DIAGNOSIS — M19011 Primary osteoarthritis, right shoulder: Secondary | ICD-10-CM | POA: Diagnosis not present

## 2024-02-16 MED ORDER — METHYLPREDNISOLONE ACETATE 40 MG/ML IJ SUSP
40.0000 mg | INTRAMUSCULAR | Status: AC | PRN
  Administered 2024-02-16: 40 mg via INTRA_ARTICULAR

## 2024-02-16 MED ORDER — LIDOCAINE HCL 1 % IJ SOLN
5.0000 mL | INTRAMUSCULAR | Status: AC | PRN
  Administered 2024-02-16: 5 mL

## 2024-02-16 MED ORDER — BUPIVACAINE HCL 0.5 % IJ SOLN
9.0000 mL | INTRAMUSCULAR | Status: AC | PRN
  Administered 2024-02-16: 9 mL via INTRA_ARTICULAR

## 2024-02-16 NOTE — Progress Notes (Signed)
 Office Visit Note   Patient: Jose [name]           Date of Birth: 11-Feb-1951           MRN: [id number] Visit Date: 02/15/2024 Requested by: Earleen Reaper, MD [address], [address] [place],  [place] [address] PCP: Earleen Reaper, MD  Subjective: Chief Complaint  Patient presents with   Right Shoulder - Pain    HPI: Jose [name] is a 73 y.o. male who presents to the office reporting right shoulder pain.  Patient had previous glenohumeral joint injection in August with good relief for 3 to 4 months.  Patient did play football as a Pharmacist, hospital.  Had polio which affected his left arm.  His right arm pain is getting worse.  Does take Tylenol for symptoms.  Recent knee injection helped his left knee.  Pain is much less..                ROS: All systems reviewed are negative as they relate to the chief complaint within the history of present illness.  Patient denies fevers or chills.  Assessment & Plan: Visit Diagnoses:  1. Primary osteoarthritis, right shoulder   2. Low back pain, unspecified back pain laterality, unspecified chronicity, unspecified whether sciatica present   3. Pain in left leg     Plan: Impression is right shoulder arthritis which radiographically appears to be bone-on-bone but from a clinical perspective he has very well maintain range of motion.  Glenohumeral joint injection performed today.  Tramadol prescribed.  Continue with shoulder stretching exercises just so it does not get too stiff.  I think he may need shoulder replacement at sometime in the future but currently his pain level is not there and his range of motion is actually pretty good as well.  Follow-Up Instructions: No follow-ups on file.   Orders:  Orders Placed This Encounter  Procedures   US Guided Needle Placement - No Linked Charges   Ambulatory referral to Physical Therapy   Meds ordered this encounter  Medications   traMADol (ULTRAM) 50 MG tablet    Sig: Take 1 tablet (50 mg  total) by mouth every 12 (twelve) hours as needed.    Dispense:  35 tablet    Refill:  0      Procedures: Large Joint Inj: L glenohumeral on 02/16/2024 2:09 PM Indications: diagnostic evaluation and pain Details: 22 G 3.5 in needle, ultrasound-guided posterior approach  Arthrogram: No  Medications: 9 mL bupivacaine 0.5 %; 40 mg methylPREDNISolone acetate 40 MG/ML; 5 mL lidocaine 1 % Outcome: tolerated well, no immediate complications Procedure, treatment alternatives, risks and benefits explained, specific risks discussed. Consent was given by the patient. Immediately prior to procedure a time out was called to verify the correct patient, procedure, equipment, support staff and site/side marked as required. Patient was prepped and draped in the usual sterile fashion.       Clinical Data: No additional findings.  Objective: Vital Signs: There were no vitals taken for this visit.  Physical Exam:  Constitutional: Patient appears well-developed HEENT:  Head: Normocephalic Eyes:EOM are normal Neck: Normal range of motion Cardiovascular: Normal rate Pulmonary/chest: Effort normal Neurologic: Patient is alert Skin: Skin is warm Psychiatric: Patient has normal mood and affect  Ortho Exam: Ortho exam demonstrates range of motion on the right of 50/95/155.  Has excellent rotator cuff strength infraspinatus supraspinatus and subscap muscle testing with no masses lymphadenopathy or skin changes noted in that shoulder girdle  region.  Radial pulse intact bilaterally.  No real paresthesias C5-T1.  Deltoid is functional and C-spine range of motion intact.  Not too much in terms of coarse grinding or crepitus with internal/external rotation of that shoulder.  Slightly unexpected based on the radiographic views of the shoulder.  Specialty Comments:  No specialty comments available.  Imaging: No results found.   PMFS History: Patient Active Problem List   Diagnosis Date Noted    Hypertension    Severe aortic stenosis 10/08/2019   Candidiasis    Abnormality of gait    Hypoalbuminemia due to protein-calorie malnutrition [facility])    History of gout    Sleep disturbance    Constipation    Rupture of quadriceps tendon, right, sequela 06/19/2017   Quadriceps tendon rupture, left, sequela 06/19/2017   Acute blood loss anemia 06/19/2017   Fall    History of subdural hematoma    Post-operative pain    Recurrent falls    Quadriceps tendon rupture 06/15/2017   Primary osteoarthritis of both knees 02/28/2017   Primary osteoarthritis of both hands 02/28/2017   Uricacidemia 02/28/2017   Pain in joint of left knee 02/07/2017   Idiopathic chronic gout, unspecified site, without tophus (tophi) 11/29/2016   HEMATURIA UNSPECIFIED 01/25/2009   Abdominal pain 01/25/2009   XERODERMA 03/10/2008   UNS ADVRS EFF UNS RX MEDICINAL&BIOLOGICAL SBSTNC 03/10/2008   ADJUSTMENT REACTION WITH PHYSICAL SYMPTOMS 02/14/2008   MUSCLE SPASM 02/14/2008   ACUTE PROSTATITIS 12/16/2007   BREAST LUMP OR MASS, RIGHT 07/12/2007   H/O: RCT (rotator cuff tear) 01/13/2007   GOUT 01/08/2007   Past Medical History:  Diagnosis Date   Arthritis    R shoulder, great toes, hands- has gout & has injections in knees with Dr. Corliss Skains    Bicuspid aortic valve with ascending aorta 4.0 to 4.5 cm in diameter    CAD (coronary artery disease)    Cancer (HCC)    skin ca-    GERD (gastroesophageal reflux disease)    Headache    History of hiatal hernia    Hypertension    Prostate cancer (HCC)    S/P TAVR (transcatheter aortic valve replacement)    SDH (subdural hematoma) (HCC)    Severe aortic stenosis    Thoracic ascending aortic aneurysm (HCC)    Vertebral artery aneurysm (HCC)     Family History  Problem Relation Age of Onset   Parkinson's disease Mother    Heart disease Father    Parkinson's disease Brother    Diabetes Brother     Past Surgical History:  Procedure Laterality Date   BRAIN  SURGERY  2016   evacuation of SDH   CARDIAC CATHETERIZATION     CARPAL TUNNEL RELEASE Bilateral    embolization of left vertebral artery aneurysm with pipeline/coil  10/31/2022   GREEN LIGHT LASER TURP (TRANSURETHRAL RESECTION OF PROSTATE  04/27/2017   KNEE ARTHROPLASTY     LEFT HEART CATH AND CORONARY ANGIOGRAPHY N/A 10/08/2019   Procedure: LEFT HEART CATH AND CORONARY ANGIOGRAPHY;  Surgeon: Tonny Bollman, MD;  Location[facility] INVASIVE CV LAB;  Service: Cardiovascular;  Laterality: N/A;   QUADRICEPS TENDON REPAIR Bilateral 06/15/2017   Procedure: REPAIR QUADRICEP TENDON;  Surgeon: Cammy Copa, MD;  Location: [facility] OR;  Service: Orthopedics;  Laterality: Bilateral;   SHOULDER SURGERY Right    TEE WITHOUT CARDIOVERSION N/A 07/03/2022   Procedure: TRANSESOPHAGEAL ECHOCARDIOGRAM (TEE);  Surgeon: Wendall Stade, MD;  Location: [facility] ENDOSCOPY;  Service: Cardiovascular;  Laterality: N/A;  TRANSCATHETER AORTIC VALVE REPLACEMENT, TRANSFEMORAL  12/23/2019   TRANSCATHETER AORTIC VALVE REPLACEMENT, TRANSFEMORAL N/A 12/23/2019   Procedure: TRANSCATHETER AORTIC VALVE REPLACEMENT, TRANSFEMORAL;  Surgeon: Tonny Bollman, MD;  Location: [facility] OR;  Service: Open Heart Surgery;  Laterality: N/A;   VASCULAR SURGERY     Social History   Occupational History   Not on file  Tobacco Use   Smoking status: Never    Passive exposure: Never   Smokeless tobacco: Never  Vaping Use   Vaping status: Never Used  Substance and Sexual Activity   Alcohol use: Yes    Comment: 1 or 2 glasses of wine weekly   Drug use: No   Sexual activity: Not on file

## 2024-02-18 ENCOUNTER — Ambulatory Visit (INDEPENDENT_AMBULATORY_CARE_PROVIDER_SITE_OTHER): Payer: Medicare Other | Admitting: Physical Therapy

## 2024-02-18 ENCOUNTER — Encounter: Payer: Self-pay | Admitting: Physical Therapy

## 2024-02-18 DIAGNOSIS — M25511 Pain in right shoulder: Secondary | ICD-10-CM | POA: Diagnosis not present

## 2024-02-18 DIAGNOSIS — M5459 Other low back pain: Secondary | ICD-10-CM | POA: Diagnosis not present

## 2024-02-18 DIAGNOSIS — M25562 Pain in left knee: Secondary | ICD-10-CM | POA: Diagnosis not present

## 2024-02-18 DIAGNOSIS — G8929 Other chronic pain: Secondary | ICD-10-CM

## 2024-02-18 DIAGNOSIS — M6281 Muscle weakness (generalized): Secondary | ICD-10-CM | POA: Diagnosis not present

## 2024-02-18 NOTE — Therapy (Signed)
 OUTPATIENT PHYSICAL THERAPY TREATMENT Re-certification   Patient Name: Jose [name] MRN: [id number] DOB:September 26, 1951, 73 y.o., male Today's Date: 02/18/2024  END OF SESSION:  PT End of Session - 02/18/24 0811     Visit Number 10    Number of Visits 22    Date for PT Re-Evaluation 04/04/24    PT Start Time 0800    PT Stop Time 0840    PT Time Calculation (min) 40 min    Activity Tolerance Patient tolerated treatment well    Behavior During Therapy WFL for tasks assessed/performed                  Past Medical History:  Diagnosis Date   Arthritis    R shoulder, great toes, hands- has gout & has injections in knees with Dr. Corliss Skains    Bicuspid aortic valve with ascending aorta 4.0 to 4.5 cm in diameter    CAD (coronary artery disease)    Cancer (HCC)    skin ca-    GERD (gastroesophageal reflux disease)    Headache    History of hiatal hernia    Hypertension    Prostate cancer (HCC)    S/P TAVR (transcatheter aortic valve replacement)    SDH (subdural hematoma) (HCC)    Severe aortic stenosis    Thoracic ascending aortic aneurysm (HCC)    Vertebral artery aneurysm (HCC)    Past Surgical History:  Procedure Laterality Date   BRAIN SURGERY  2016   evacuation of SDH   CARDIAC CATHETERIZATION     CARPAL TUNNEL RELEASE Bilateral    embolization of left vertebral artery aneurysm with pipeline/coil  10/31/2022   GREEN LIGHT LASER TURP (TRANSURETHRAL RESECTION OF PROSTATE  04/27/2017   KNEE ARTHROPLASTY     LEFT HEART CATH AND CORONARY ANGIOGRAPHY N/A 10/08/2019   Procedure: LEFT HEART CATH AND CORONARY ANGIOGRAPHY;  Surgeon: Tonny Bollman, MD;  Location: [facility] INVASIVE CV LAB;  Service: Cardiovascular;  Laterality: N/A;   QUADRICEPS TENDON REPAIR Bilateral 06/15/2017   Procedure: REPAIR QUADRICEP TENDON;  Surgeon: Cammy Copa, MD;  Location: [facility] OR;  Service: Orthopedics;  Laterality: Bilateral;   SHOULDER SURGERY Right    TEE WITHOUT CARDIOVERSION N/A  07/03/2022   Procedure: TRANSESOPHAGEAL ECHOCARDIOGRAM (TEE);  Surgeon: Wendall Stade, MD;  Location: [facility] ENDOSCOPY;  Service: Cardiovascular;  Laterality: N/A;   TRANSCATHETER AORTIC VALVE REPLACEMENT, TRANSFEMORAL  12/23/2019   TRANSCATHETER AORTIC VALVE REPLACEMENT, TRANSFEMORAL N/A 12/23/2019   Procedure: TRANSCATHETER AORTIC VALVE REPLACEMENT, TRANSFEMORAL;  Surgeon: Tonny Bollman, MD;  Location: [facility] OR;  Service: Open Heart Surgery;  Laterality: N/A;   VASCULAR SURGERY     Patient Active Problem List   Diagnosis Date Noted   Hypertension    Severe aortic stenosis 10/08/2019   Candidiasis    Abnormality of gait    Hypoalbuminemia due to protein-calorie malnutrition [facility])    History of gout    Sleep disturbance    Constipation    Rupture of quadriceps tendon, right, sequela 06/19/2017   Quadriceps tendon rupture, left, sequela 06/19/2017   Acute blood loss anemia 06/19/2017   Fall    History of subdural hematoma    Post-operative pain    Recurrent falls    Quadriceps tendon rupture 06/15/2017   Primary osteoarthritis of both knees 02/28/2017   Primary osteoarthritis of both hands 02/28/2017   Uricacidemia 02/28/2017   Pain in joint of left knee 02/07/2017   Idiopathic chronic gout, unspecified site, without tophus (tophi) 11/29/2016  HEMATURIA UNSPECIFIED 01/25/2009   Abdominal pain 01/25/2009   XERODERMA 03/10/2008   UNS ADVRS EFF UNS RX MEDICINAL&BIOLOGICAL SBSTNC 03/10/2008   ADJUSTMENT REACTION WITH PHYSICAL SYMPTOMS 02/14/2008   MUSCLE SPASM 02/14/2008   ACUTE PROSTATITIS 12/16/2007   BREAST LUMP OR MASS, RIGHT 07/12/2007   H/O: RCT (rotator cuff tear) 01/13/2007   GOUT 01/08/2007    PCP: Earleen Reaper, MD   REFERRING PROVIDER: Cammy Copa, MD   REFERRING DIAG:  [phone] (ICD-10-CM) - Synovitis of left knee  M19.011 (ICD-10-CM) - Primary osteoarthritis, right shoulder  M54.50 (ICD-10-CM) - Low back pain, unspecified back pain laterality,  unspecified chronicity, unspecified whether sciatica present    THERAPY DIAG:  Chronic right shoulder pain  Chronic pain of left knee  Other low back pain  Muscle weakness (generalized)  Rationale for Evaluation and Treatment: Rehabilitation  ONSET DATE: see subjective, on-going for years    PERTINENT HISTORY: HTN, severe aortic stenosis, abnormality of gait, h/o gout, rupture of quadriceps tendon on Rt 2018 on left 2018, subdural hematoma 2016, h/o falls, primary OA in bil hands Polio on Left side affecting Left UE strength  SUBJECTIVE:    SUBJECTIVE STATEMENT: Pt stating he underwent Rt shoulder injection on Friday. Pt stating he is still having pain but it's different type of pain.     EVAL:  Pt stating he has been painting a lot at home and feels it may have exacerbated his Rt shoulder. Pt reporting radiation pain down his arm but no numbness/tingling. Pt stating Rt shoulder is giving him the most problem. Pt stating after getting the second shingles shot his shoulder pain increased into tricep and shoulder. Pt stating pain has been ongoing for about a year. Pt stating pain is worse at night.  Pt stating left knee pain has been ongoing since January 2018 when he was skiing and twisted his knee and he tore his quadriceps tendon. Pt stating he fell and when he fell he tore both tendons in bil LE. Pt then stating the repair didn't take on his left LE and it was repaired again. Pt stating he had PT and he gained ROM back in bil LE.  Pt reporting falling while in hospital during a bed transfer where he landed on his floor on his back in 2018. Pt stating his back has bothered him on/off ever since.  Pt stating he hasn't been able to work out over the past few months due to recent move into [place].   PAIN:  NPRS scale:5/10 in Rt shoulder, 4/10 in low back Pain description: achy, throbbing Aggravating factors: resting, tylenol, voltaren Relieving factors: certain  movement  PRECAUTIONS: None  WEIGHT BEARING RESTRICTIONS: No  FALLS:  Has patient fallen in last 6 months? No falls in last few years. Haven't fell since he tore his quad tendons and during a hospital transfer several years ago  LIVING ENVIRONMENT: Lives with: lives with their family and lives with an adult companion Lives in: House/apartment Stairs: Yes: Internal: 17 steps; on left going up and External: 2 steps; on left going up Has following equipment at home: None  OCCUPATION: retired  PLOF: Independent  PATIENT GOALS: Be able to work out at gym with less pain  Next MD visit:   OBJECTIVE:   DIAGNOSTIC FINDINGS: Shoulder: 08/06/23  3 views of the right shoulder including Grashey, scapular Y and axial view  were ordered and reviewed by myself.  X-rays show severe osteoarthritis of  the right glenohumeral joint.  There is  a large inferior spur off the  humeral head.  There is some chondrocalcinosis superior to the humeral  head, likely indicative of pseudogout or prior gout.  No acute bony  fracture noted.  There is bony sclerosis noted of the humeral head and  inferior acetabulum from likely arthritic change.   Knee 05/22/23:   1. Postsurgical changes of quadriceps tendon repair. The tendon appears intact. Resolution of the prior small fluid bright signal within the midsubstance of the quadriceps tendon insertion on prior 10/01/2018 MRI. 2. Stable chronic oblique tear of the anterior horn of the lateral meniscus. Mild degenerative undersurface fraying of the posterior horn of the lateral meniscus. 3. Interval worsening of moderate to severe patellofemoral cartilage degenerative changes. 4. Mildly worsened cartilage loss within the anteromedial aspect of the weight-bearing lateral femoral condyle and adjacent lateral tibial plateau. 5. Unchanged high-grade partial to full-thickness cartilage loss within the weight-bearing medial femoral condyle.  Lumbar: 05/22/23:  AP  lateral radiographs lumbar spine reviewed.  Grade 1-2 spondylolisthesis present at L5-S1 with pars fracture noted at the corresponding level.   Mild to moderate degenerative changes present in the upper and and middle lumbar spine region.  Visualized hips without arthritis    PATIENT SURVEYS:  12/05/23: FOTO intake:  49%  predicted:  51% 12/31/23: FOTO update 48%   COGNITION: Overall cognitive status: WFL    SENSATION: WFL   POSTURE:  rounded shoulders, forward head, and decreased lumbar lordosis  PALPATION: TTP: distal left quad tendon, Rt upper trap and levator and anterior shoulder  MMT   Right 12/04/23 Left 12/04/23 Rt  / Left 12/18/23 HHD ppsi  Hip flexion 5 5   Hip extension 5 5   Hip abduction 5 5   Hip adduction 5 5   Hip internal rotation     Hip external rotation     Knee flexion 5 5 52.5 / 40.0  Knee extension 5 5 54.4 / 65.5                   SHOULDER      Shoulder flexion 4 3   Shoulder abduction 4 3   Shoulder ER 4- 3   Shoulder IR 4- 3    (Blank rows = not tested)    ROM  LE Right 12/04/23 Left 12/04/23 Rt  01/22/24 Left 01/22/24 Active supine 02/18/24 Rt  Active standing  Hip flexion 108 110 112 112 140   Hip extension       Hip abduction       Hip adduction       Hip internal rotation       Hip external rotation       Knee flexion 134 126     Knee extension 0 0      SHOULDER:         Shoulder flexion 138 158 145 155 155  Shoulder abduction 125 140   130  Shoulder ER 30 60   52   (Blank rows = not tested)    FUNCTIONAL TESTS:   12/04/23 : 5 time sit to stand: 17.2 seconds no UE support 01/22/24: 5 time sit to stand: 9.2 seconds no UE support 02/18/24:  10.1 seconds no UE support  GAIT: Distance walked: clinic distances, level surface Assistive device utilized: None Level of assistance: Complete Independence Comments: mild forward flexed trunk posture  TODAY'S TREATMENT                                                                           DATE: 02/18/24 TherEx:  Nustep: Level 5 x 6 minutes Sitting: hamstring stretch: x bil LE holding 30 sec Supine low trunk rotations 5 sec X 10 bilat Supine SKTC stretch holding 30 sec Prone: hip extension x 10 bil LE, holding 3 sec Prone hamstring curls: green TB 2 x 10 bil LE Theractivity Rows: BATCA 10# each side 2X15 bilat BATCA: attempted lat pull downs but pt with increased shoulder pain Leg press: 100# 2 x 15, single LE: 56# 2 x 15 Sit to stand: x 5 no UE support  Stepping over 6 inch hurdles x 6 (3 hurdles)    DATE: 01/30/24 TherEx:  Supine low trunk rotations 5 sec X 10 bilat Supine SKTC stretch  Sci fit UE/LE L5.2 for 10  min Prone: hip extension x 10 bil LE Quadraped: hip extension c verbal and  Theractivity Rows: BATCA 10# each side 2X15 bilat Shoulder extension:BATCA 10# each side 2X15 bilat BATCA: wood crop 10#  x 15 bil directions BATCA anti rotation press 5# 2 X 15 bilat Leg press: 93# 2 x 15, single LE: 56# 2 x 15  DATE: 01/22/24 TherEx:  Nustep L6 X 10 min UE/LE  Standing rows: blue TB 2 x 15  Standing wall trunk extension: x 5 holding 10 sec  Prone: hip extension x 10 bil LE Quadraped: hip extension c verbal and  Sit to stand: 2 x 5 (best time: 9.2 seconds no UE support) Rows: level 4 band: 2 x 10 holding 5 sec Shoulder extension: level 4 2 x 10  BATCA: wood crop 5#  x 10 bil directions  Updated ROM for shoulder and hips see chart above   TODAY'S TREATMENT                                                                           DATE: 01/16/24 TherEx:  Nustep L6 X 10 min UE/LE  Standing rows: blue TB 2 x 15  Standing shoulder extension blue TB 2 x 15  Standing wall trunk extension: x 5 holding 10 sec  Wall push ups x 15 Leg press: 87# 2 x 10, single LE: 50# x 10   Supine trunk rotation: x 10 bil sides holding 10 sec Bridges: 2 x 10 holding 5 sec Prone: hip extension: 2 x 10 bil LE holding 3 sec Prone elbow press ups x 10 holding 10 sec     PATIENT EDUCATION:  Education details: HEP, POC Person educated: Patient Education method: Programmer, multimedia, Demonstration, Verbal cues, and Handouts Education comprehension: verbalized understanding, returned demonstration, and verbal cues required  HOME EXERCISE PROGRAM: Access Code: [email or web address] URL: [email or web address] Date: 12/05/2023 Prepared by: Narda Amber  Exercises - Standing Shoulder Row with Anchored Resistance  - 1 x daily - 7 x weekly - 3 sets -  10 reps - Supine Bridge  - 2 x daily - 7 x weekly - 2 sets - 10 reps - 5 seconds hold - Supine Shoulder External Rotation in 45 Degrees Abduction AAROM with Dowel  - 1 x daily - 7 x weekly - 3 sets - 10 reps - Supine Shoulder Flexion Extension AAROM with Dowel  - 2-3 x daily - 7 x weekly - 2 sets - 10 reps - Seated Small Alternating Straight Leg Lifts with Heel Touch  - 2 x daily - 7 x weekly - 15-20 reps - Recumbent Bike  - 2-3 x daily - 7 x weekly Clam shell Bridge: 2 x 10   ASSESSMENT:  CLINICAL IMPRESSION: Pt has only been able to attend 10 of his 20 visits due to sickness and scheduling conflicts. Pt is still progressing toward all of his LTG's. Pt reporting improvements in his his ADL's since beginning therapy however pt's still presenting with limited Rt Shoulder ROM, strength. I am requesting 6 additional weeks of PT with 1-2x/ week frequency to progress overall strengthening, ROM, functional mobility and balance.    OBJECTIVE IMPAIRMENTS: difficulty walking, decreased ROM, decreased strength, increased edema, impaired flexibility, impaired UE functional use, postural dysfunction, and pain.   ACTIVITY LIMITATIONS: lifting, bending, standing, squatting, sleeping, stairs, transfers, reach over head, and  hygiene/grooming  PARTICIPATION LIMITATIONS: cleaning, community activity, and yard work  PERSONAL FACTORS: 3+ comorbidities: see pertinent history  are also affecting patient's functional outcome.   REHAB POTENTIAL: Good  CLINICAL DECISION MAKING: Evolving/moderate complexity  EVALUATION COMPLEXITY: High   GOALS: Goals reviewed with patient? Yes  SHORT TERM GOALS: (target date for Short term goals are 3 weeks 12/25/2023)   1.  Patient will demonstrate independent use of home exercise program to maintain progress from in clinic treatments.  Goal status:MET 02/18/24  2. Pt to improve his 5 time sit to stand to </=  13  seconds with UE support.   Goal Status: MET 02/18/24   LONG TERM GOALS: (target dates for all long term goals are 6 weeks 04/04/24)   1. Patient will demonstrate/report pain at worst less than or equal to 2/10 to facilitate minimal limitation in daily activity secondary to pain symptoms.  Goal status: New   2. Patient will demonstrate independent use of home exercise program to facilitate ability to maintain/progress functional gains from skilled physical therapy services.  Goal status: New   3. Patient will demonstrate FOTO outcome > or = 51 % to indicate reduced disability due to condition.  49.5% FOTO 02/18/24 Goal status: New   4.  Patient will be able to perform up and down 1 flight of stairs with reciprocal gait pattern with left knee pain and low back pain </= 2/10.   Goal status: New   5.  Patient will demonstrate improved Rt UE flexion to >/= 150 degrees in order to improve functional mobility.   Goal status: New   6.  Pt will improve Rt shoulder ER to >/= 60 deg in order to improve functional mobility.  Goal status: New      PLAN:  PT FREQUENCY: 1-2x/week  PT DURATION: 6 weeks  PLANNED INTERVENTIONS: Can include 95621- PT Re-evaluation, 97110-Therapeutic exercises, 97530- Therapeutic activity, [id number]- Neuromuscular re-education, 97535- Self  Care, 97140- Manual therapy, [id number]- Gait training, [phone]- Orthotic Fit/training, [phone]- Canalith repositioning, [id number]- Aquatic Therapy, 97014- Electrical stimulation (unattended), [id number]- Electrical stimulation (manual), [id number]- Vasopneumatic device, [id number]- Ultrasound, [id number]- Traction (mechanical), [id number]- Ionotophoresis 4mg /ml Dexamethasone, Patient/Family education, Balance training,  Stair training, Taping, Dry Needling, Joint mobilization, Joint manipulation, Spinal manipulation, Spinal mobilization, Scar mobilization, Vestibular training, Visual/preceptual remediation/compensation, DME instructions, Cryotherapy, and Moist heat.  All performed as medically necessary.  All included unless contraindicated  PLAN FOR NEXT SESSION:  shoulder and back exercises, overall strengthening of UEs and LEs, core, try percussion or STM as needed  Re-certification: 02/18/24 for 6 more weeks   Sharmon Leyden, PT, MPT 02/18/2024, 8:50 AM

## 2024-02-27 ENCOUNTER — Encounter: Payer: Self-pay | Admitting: Physical Therapy

## 2024-02-27 ENCOUNTER — Ambulatory Visit (INDEPENDENT_AMBULATORY_CARE_PROVIDER_SITE_OTHER): Admitting: Physical Therapy

## 2024-02-27 DIAGNOSIS — M6281 Muscle weakness (generalized): Secondary | ICD-10-CM

## 2024-02-27 DIAGNOSIS — M25511 Pain in right shoulder: Secondary | ICD-10-CM

## 2024-02-27 DIAGNOSIS — M25562 Pain in left knee: Secondary | ICD-10-CM

## 2024-02-27 DIAGNOSIS — M5459 Other low back pain: Secondary | ICD-10-CM

## 2024-02-27 DIAGNOSIS — G8929 Other chronic pain: Secondary | ICD-10-CM

## 2024-02-27 NOTE — Therapy (Addendum)
 OUTPATIENT PHYSICAL THERAPY TREATMENT    Patient Name: Jose [name] MRN: [id number] DOB:[birth year], 73 y.o., male Today's Date: 02/27/2024  END OF SESSION:  PT End of Session - 02/27/24 0810     Visit Number 11    Number of Visits 22    Date for PT Re-Evaluation 04/04/24    PT Start Time 0802    PT Stop Time 0840    PT Time Calculation (min) 38 min    Activity Tolerance Patient tolerated treatment well    Behavior During Therapy WFL for tasks assessed/performed                  Past Medical History:  Diagnosis Date   Arthritis    R shoulder, great toes, hands- has gout & has injections in knees with Dr. Corliss Skains    Bicuspid aortic valve with ascending aorta 4.0 to 4.5 cm in diameter    CAD (coronary artery disease)    Cancer (HCC)    skin ca-    GERD (gastroesophageal reflux disease)    Headache    History of hiatal hernia    Hypertension    Prostate cancer (HCC)    S/P TAVR (transcatheter aortic valve replacement)    SDH (subdural hematoma) (HCC)    Severe aortic stenosis    Thoracic ascending aortic aneurysm (HCC)    Vertebral artery aneurysm (HCC)    Past Surgical History:  Procedure Laterality Date   BRAIN SURGERY  2016   evacuation of SDH   CARDIAC CATHETERIZATION     CARPAL TUNNEL RELEASE Bilateral    embolization of left vertebral artery aneurysm with pipeline/coil  10/31/2022   GREEN LIGHT LASER TURP (TRANSURETHRAL RESECTION OF PROSTATE  04/27/2017   KNEE ARTHROPLASTY     LEFT HEART CATH AND CORONARY ANGIOGRAPHY N/A 10/08/2019   Procedure: LEFT HEART CATH AND CORONARY ANGIOGRAPHY;  Surgeon: Tonny Bollman, MD;  Location: [facility] INVASIVE CV LAB;  Service: Cardiovascular;  Laterality: N/A;   QUADRICEPS TENDON REPAIR Bilateral 06/15/2017   Procedure: REPAIR QUADRICEP TENDON;  Surgeon: Cammy Copa, MD;  Location: [facility] OR;  Service: Orthopedics;  Laterality: Bilateral;   SHOULDER SURGERY Right    TEE WITHOUT CARDIOVERSION N/A 07/03/2022    Procedure: TRANSESOPHAGEAL ECHOCARDIOGRAM (TEE);  Surgeon: Wendall Stade, MD;  Location: [facility] ENDOSCOPY;  Service: Cardiovascular;  Laterality: N/A;   TRANSCATHETER AORTIC VALVE REPLACEMENT, TRANSFEMORAL  12/23/2019   TRANSCATHETER AORTIC VALVE REPLACEMENT, TRANSFEMORAL N/A 12/23/2019   Procedure: TRANSCATHETER AORTIC VALVE REPLACEMENT, TRANSFEMORAL;  Surgeon: Tonny Bollman, MD;  Location: [facility] OR;  Service: Open Heart Surgery;  Laterality: N/A;   VASCULAR SURGERY     Patient Active Problem List   Diagnosis Date Noted   Hypertension    Severe aortic stenosis 10/08/2019   Candidiasis    Abnormality of gait    Hypoalbuminemia due to protein-calorie malnutrition [facility])    History of gout    Sleep disturbance    Constipation    Rupture of quadriceps tendon, right, sequela 06/19/2017   Quadriceps tendon rupture, left, sequela 06/19/2017   Acute blood loss anemia 06/19/2017   Fall    History of subdural hematoma    Post-operative pain    Recurrent falls    Quadriceps tendon rupture 06/15/2017   Primary osteoarthritis of both knees 02/28/2017   Primary osteoarthritis of both hands 02/28/2017   Uricacidemia 02/28/2017   Pain in joint of left knee 02/07/2017   Idiopathic chronic gout, unspecified site, without tophus (tophi) 11/29/2016  HEMATURIA UNSPECIFIED 01/25/2009   Abdominal pain 01/25/2009   XERODERMA 03/10/2008   UNS ADVRS EFF UNS RX MEDICINAL&BIOLOGICAL SBSTNC 03/10/2008   ADJUSTMENT REACTION WITH PHYSICAL SYMPTOMS 02/14/2008   MUSCLE SPASM 02/14/2008   ACUTE PROSTATITIS 12/16/2007   BREAST LUMP OR MASS, RIGHT 07/12/2007   H/O: RCT (rotator cuff tear) 01/13/2007   GOUT 01/08/2007    PCP: Earleen Reaper, MD   REFERRING PROVIDER: Cammy Copa, MD   REFERRING DIAG:  [id number] (ICD-10-CM) - Synovitis of left knee  M19.011 (ICD-10-CM) - Primary osteoarthritis, right shoulder  M54.50 (ICD-10-CM) - Low back pain, unspecified back pain laterality, unspecified  chronicity, unspecified whether sciatica present    THERAPY DIAG:  Chronic right shoulder pain  Chronic pain of left knee  Other low back pain  Muscle weakness (generalized)  Rationale for Evaluation and Treatment: Rehabilitation  ONSET DATE: see subjective, on-going for years    PERTINENT HISTORY: HTN, severe aortic stenosis, abnormality of gait, h/o gout, rupture of quadriceps tendon on Rt 2018 on left 2018, subdural hematoma 2016, h/o falls, primary OA in bil hands Polio on Left side affecting Left UE strength  SUBJECTIVE:    SUBJECTIVE STATEMENT: Pt arriving today reporting 6/10 pain in low back and shoulder. Pt stating he usually has more time to get up and get moving prior to therapy session but this mornings session was at 8:00 and pt reporting he hasn't been up as long and is more stiff and has more pain today than normal.     EVAL:  Pt stating he has been painting a lot at home and feels it may have exacerbated his Rt shoulder. Pt reporting radiation pain down his arm but no numbness/tingling. Pt stating Rt shoulder is giving him the most problem. Pt stating after getting the second shingles shot his shoulder pain increased into tricep and shoulder. Pt stating pain has been ongoing for about a year. Pt stating pain is worse at night.  Pt stating left knee pain has been ongoing since January 2018 when he was skiing and twisted his knee and he tore his quadriceps tendon. Pt stating he fell and when he fell he tore both tendons in bil LE. Pt then stating the repair didn't take on his left LE and it was repaired again. Pt stating he had PT and he gained ROM back in bil LE.  Pt reporting falling while in hospital during a bed transfer where he landed on his floor on his back in 2018. Pt stating his back has bothered him on/off ever since.  Pt stating he hasn't been able to work out over the past few months due to recent move into [place].   PAIN:  NPRS scale: 6/10 in Rt  shoulder, 6/10 in low back Pain description: achy, throbbing Aggravating factors: resting, tylenol, voltaren Relieving factors: certain movement  PRECAUTIONS: None  WEIGHT BEARING RESTRICTIONS: No  FALLS:  Has patient fallen in last 6 months? No falls in last few years. Haven't fell since he tore his quad tendons and during a hospital transfer several years ago  LIVING ENVIRONMENT: Lives with: lives with their family and lives with an adult companion Lives in: House/apartment Stairs: Yes: Internal: 17 steps; on left going up and External: 2 steps; on left going up Has following equipment at home: None  OCCUPATION: retired  PLOF: Independent  PATIENT GOALS: Be able to work out at gym with less pain  Next MD visit:   OBJECTIVE:   DIAGNOSTIC FINDINGS: Shoulder: 08/06/23  3 views of the right shoulder including Grashey, scapular Y and axial view  were ordered and reviewed by myself.  X-rays show severe osteoarthritis of  the right glenohumeral joint.  There is a large inferior spur off the  humeral head.  There is some chondrocalcinosis superior to the humeral  head, likely indicative of pseudogout or prior gout.  No acute bony  fracture noted.  There is bony sclerosis noted of the humeral head and  inferior acetabulum from likely arthritic change.   Knee 05/22/23:   1. Postsurgical changes of quadriceps tendon repair. The tendon appears intact. Resolution of the prior small fluid bright signal within the midsubstance of the quadriceps tendon insertion on prior 10/01/2018 MRI. 2. Stable chronic oblique tear of the anterior horn of the lateral meniscus. Mild degenerative undersurface fraying of the posterior horn of the lateral meniscus. 3. Interval worsening of moderate to severe patellofemoral cartilage degenerative changes. 4. Mildly worsened cartilage loss within the anteromedial aspect of the weight-bearing lateral femoral condyle and adjacent lateral tibial  plateau. 5. Unchanged high-grade partial to full-thickness cartilage loss within the weight-bearing medial femoral condyle.  Lumbar: 05/22/23:  AP lateral radiographs lumbar spine reviewed.  Grade 1-2 spondylolisthesis present at L5-S1 with pars fracture noted at the corresponding level.   Mild to moderate degenerative changes present in the upper and and middle lumbar spine region.  Visualized hips without arthritis    PATIENT SURVEYS:  12/05/23: FOTO intake:  49%  predicted:  51% 12/31/23: FOTO update 48%  02/27/24: FOTO update 57%  COGNITION: Overall cognitive status: WFL    SENSATION: WFL   POSTURE:  rounded shoulders, forward head, and decreased lumbar lordosis  PALPATION: TTP: distal left quad tendon, Rt upper trap and levator and anterior shoulder  MMT   Right 12/04/23 Left 12/04/23 Rt  / Left 12/18/23 HHD ppsi  Hip flexion 5 5   Hip extension 5 5   Hip abduction 5 5   Hip adduction 5 5   Hip internal rotation     Hip external rotation     Knee flexion 5 5 52.5 / 40.0  Knee extension 5 5 54.4 / 65.5                   SHOULDER      Shoulder flexion 4 3   Shoulder abduction 4 3   Shoulder ER 4- 3   Shoulder IR 4- 3    (Blank rows = not tested)    ROM  LE Right 12/04/23 Left 12/04/23 Rt  01/22/24 Left 01/22/24 Active supine 02/18/24 Rt  Active standing  Hip flexion 108 110 112 112 140   Hip extension       Hip abduction       Hip adduction       Hip internal rotation       Hip external rotation       Knee flexion 134 126     Knee extension 0 0      SHOULDER:         Shoulder flexion 138 158 145 155 155  Shoulder abduction 125 140   130  Shoulder ER 30 60   52   (Blank rows = not tested)    FUNCTIONAL TESTS:   12/04/23 : 5 time sit to stand: 17.2 seconds no UE support 01/22/24: 5 time sit to stand: 9.2 seconds no UE support 02/18/24:  10.1 seconds no UE support  GAIT: Distance walked: clinic distances, level  surface Assistive device  utilized: None Level of assistance: Complete Independence Comments: mild forward flexed trunk posture                                                                                                                                                                       TODAY'S TREATMENT                                                                           DATE: 02/27/24 TherEx:  Recumbent bike: level 4 x 6 minutes Standing Row: level 4, 2 x 15 holding 3 Standing shoulder extension: level 4, x 15 holding 3 sec Supine trunk rotation: x 3 holding 30 sec Supine shoulder flexion c 2# bar 2 x 10 Supine Rt shoulder ER x 15 AAROM c 2# bar Supine bil shoulder protraction x 15 c 2# bar TherActivites:  Leg Press: 100# 2 x 15 , single LE 43# 2 x 10 Sit to stand: x 10     TODAY'S TREATMENT                                                                           DATE: 02/18/24 TherEx:  Nustep: Level 5 x 6 minutes Sitting: hamstring stretch: x bil LE holding 30 sec Supine low trunk rotations 5 sec X 10 bilat Supine SKTC stretch holding 30 sec Prone: hip extension x 10 bil LE, holding 3 sec Prone hamstring curls: green TB 2 x 10 bil LE Theractivity Rows: BATCA 10# each side 2X15 bilat BATCA: attempted lat pull downs but pt with increased shoulder pain Leg press: 100# 2 x 15, single LE: 56# 2 x 15 Sit to stand: x 5 no UE support  Stepping over 6 inch hurdles x 6 (3 hurdles)    DATE: 01/30/24 TherEx:  Supine low trunk rotations 5 sec X 10 bilat Supine SKTC stretch  Sci fit UE/LE L5.2 for 10  min Prone: hip extension x 10 bil LE Quadraped: hip extension c verbal and  Theractivity Rows: BATCA 10# each side 2X15 bilat Shoulder extension:BATCA 10# each side 2X15 bilat BATCA: wood crop 10#  x 15 bil directions BATCA anti rotation press 5# 2  X 15 bilat Leg press: 93# 2 x 15, single LE: 56# 2 x 15  DATE: 01/22/24 TherEx:  Nustep L6 X 10 min UE/LE  Standing rows: blue TB 2 x 15  Standing  wall trunk extension: x 5 holding 10 sec  Prone: hip extension x 10 bil LE Quadraped: hip extension c verbal and  Sit to stand: 2 x 5 (best time: 9.2 seconds no UE support) Rows: level 4 band: 2 x 10 holding 5 sec Shoulder extension: level 4 2 x 10  BATCA: wood crop 5#  x 10 bil directions  Updated ROM for shoulder and hips see chart above        PATIENT EDUCATION:  Education details: HEP, POC Person educated: Patient Education method: Programmer, multimedia, Demonstration, Verbal cues, and Handouts Education comprehension: verbalized understanding, returned demonstration, and verbal cues required  HOME EXERCISE PROGRAM: Access Code: [email or web address] URL: [email or web address] Date: 12/05/2023 Prepared by: Narda Amber  Exercises - Standing Shoulder Row with Anchored Resistance  - 1 x daily - 7 x weekly - 3 sets - 10 reps - Supine Bridge  - 2 x daily - 7 x weekly - 2 sets - 10 reps - 5 seconds hold - Supine Shoulder External Rotation in 45 Degrees Abduction AAROM with Dowel  - 1 x daily - 7 x weekly - 3 sets - 10 reps - Supine Shoulder Flexion Extension AAROM with Dowel  - 2-3 x daily - 7 x weekly - 2 sets - 10 reps - Seated Small Alternating Straight Leg Lifts with Heel Touch  - 2 x daily - 7 x weekly - 15-20 reps - Recumbent Bike  - 2-3 x daily - 7 x weekly Clam shell Bridge: 2 x 10   ASSESSMENT:  CLINICAL IMPRESSION: Pt arriving today reporting 6/10 pain in his low back and Rt shoulder. Today's treatment focusing on bilateral areas and core strength. Pt tolerating well. Recommend continued skilled PT interventions.    OBJECTIVE IMPAIRMENTS: difficulty walking, decreased ROM, decreased strength, increased edema, impaired flexibility, impaired UE functional use, postural dysfunction, and pain.   ACTIVITY LIMITATIONS: lifting, bending, standing, squatting, sleeping, stairs, transfers, reach over head, and hygiene/grooming  PARTICIPATION LIMITATIONS: cleaning, community  activity, and yard work  PERSONAL FACTORS: 3+ comorbidities: see pertinent history  are also affecting patient's functional outcome.   REHAB POTENTIAL: Good  CLINICAL DECISION MAKING: Evolving/moderate complexity  EVALUATION COMPLEXITY: High   GOALS: Goals reviewed with patient? Yes  SHORT TERM GOALS: (target date for Short term goals are 3 weeks 12/25/2023)   1.  Patient will demonstrate independent use of home exercise program to maintain progress from in clinic treatments.  Goal status:MET 02/18/24  2. Pt to improve his 5 time sit to stand to </=  13  seconds with UE support.   Goal Status: MET 02/18/24   LONG TERM GOALS: (target dates for all long term goals are 6 weeks 04/04/24)   1. Patient will demonstrate/report pain at worst less than or equal to 2/10 to facilitate minimal limitation in daily activity secondary to pain symptoms.  Goal status: New   2. Patient will demonstrate independent use of home exercise program to facilitate ability to maintain/progress functional gains from skilled physical therapy services.  Goal status: New   3. Patient will demonstrate FOTO outcome > or = 51 % to indicate reduced disability due to condition.  49.5% FOTO 02/18/24 Goal status: New   4.  Patient will be able to perform up and down 1 flight of  stairs with reciprocal gait pattern with left knee pain and low back pain </= 2/10.   Goal status: New   5.  Patient will demonstrate improved Rt UE flexion to >/= 150 degrees in order to improve functional mobility.   Goal status: New   6.  Pt will improve Rt shoulder ER to >/= 60 deg in order to improve functional mobility.  Goal status: New      PLAN:  PT FREQUENCY: 1-2x/week  PT DURATION: 6 weeks  PLANNED INTERVENTIONS: Can include 16109- PT Re-evaluation, 97110-Therapeutic exercises, 97530- Therapeutic activity, [id number]- Neuromuscular re-education, 97535- Self Care, 97140- Manual therapy, [phone]- Gait training, [phone]- Orthotic  Fit/training, [phone]- Canalith repositioning, [id number]- Aquatic Therapy, 97014- Electrical stimulation (unattended), [id number]- Electrical stimulation (manual), [id number]- Vasopneumatic device, [id number]- Ultrasound, [id number]- Traction (mechanical), [id number]- Ionotophoresis 4mg /ml Dexamethasone, Patient/Family education, Balance training, Stair training, Taping, Dry Needling, Joint mobilization, Joint manipulation, Spinal manipulation, Spinal mobilization, Scar mobilization, Vestibular training, Visual/preceptual remediation/compensation, DME instructions, Cryotherapy, and Moist heat.  All performed as medically necessary.  All included unless contraindicated  PLAN FOR NEXT SESSION:  shoulder and back exercises, overall strengthening of UEs and LEs, core, try percussion or STM as needed  Re-certification: 02/18/24 for 6 more weeks   Sharmon Leyden, PT, MPT 02/27/2024, 10:28 AM

## 2024-02-29 ENCOUNTER — Ambulatory Visit: Payer: Medicare Other | Admitting: Rehabilitative and Restorative Service Providers"

## 2024-02-29 ENCOUNTER — Encounter: Payer: Self-pay | Admitting: Rehabilitative and Restorative Service Providers"

## 2024-02-29 DIAGNOSIS — M5459 Other low back pain: Secondary | ICD-10-CM | POA: Diagnosis not present

## 2024-02-29 DIAGNOSIS — M6281 Muscle weakness (generalized): Secondary | ICD-10-CM | POA: Diagnosis not present

## 2024-02-29 DIAGNOSIS — M25511 Pain in right shoulder: Secondary | ICD-10-CM

## 2024-02-29 DIAGNOSIS — M25562 Pain in left knee: Secondary | ICD-10-CM | POA: Diagnosis not present

## 2024-02-29 DIAGNOSIS — G8929 Other chronic pain: Secondary | ICD-10-CM

## 2024-02-29 NOTE — Therapy (Signed)
 OUTPATIENT PHYSICAL THERAPY TREATMENT    Patient Name: Jose [name] MRN: [id number] DOB:03-24-1951, 73 y.o., male Today's Date: 02/29/2024  END OF SESSION:  PT End of Session - 02/29/24 0939     Visit Number 12    Number of Visits 22    Date for PT Re-Evaluation 04/04/24    PT Start Time 0936    PT Stop Time 1016    PT Time Calculation (min) 40 min    Activity Tolerance Patient tolerated treatment well;No increased pain    Behavior During Therapy WFL for tasks assessed/performed                   Past Medical History:  Diagnosis Date   Arthritis    R shoulder, great toes, hands- has gout & has injections in knees with Jose [name]    Bicuspid aortic valve with ascending aorta 4.0 to 4.5 cm in diameter    CAD (coronary artery disease)    Cancer (HCC)    skin ca-    GERD (gastroesophageal reflux disease)    Headache    History of hiatal hernia    Hypertension    Prostate cancer (HCC)    S/P TAVR (transcatheter aortic valve replacement)    SDH (subdural hematoma) (HCC)    Severe aortic stenosis    Thoracic ascending aortic aneurysm (HCC)    Vertebral artery aneurysm [facility])    Past Surgical History:  Procedure Laterality Date   BRAIN SURGERY  2016   evacuation of SDH   CARDIAC CATHETERIZATION     CARPAL TUNNEL RELEASE Bilateral    embolization of left vertebral artery aneurysm with pipeline/coil  10/31/2022   GREEN LIGHT LASER TURP (TRANSURETHRAL RESECTION OF PROSTATE  04/27/2017   KNEE ARTHROPLASTY     LEFT HEART CATH AND CORONARY ANGIOGRAPHY N/A 10/08/2019   Procedure: LEFT HEART CATH AND CORONARY ANGIOGRAPHY;  Surgeon: Jose Bollman, MD;  Location: [facility] INVASIVE CV LAB;  Service: Cardiovascular;  Laterality: N/A;   QUADRICEPS TENDON REPAIR Bilateral 06/15/2017   Procedure: REPAIR QUADRICEP TENDON;  Surgeon: Jose Copa, MD;  Location: [facility] OR;  Service: Orthopedics;  Laterality: Bilateral;   SHOULDER SURGERY Right    TEE WITHOUT CARDIOVERSION  N/A 07/03/2022   Procedure: TRANSESOPHAGEAL ECHOCARDIOGRAM (TEE);  Surgeon: Jose Stade, MD;  Location: [facility] ENDOSCOPY;  Service: Cardiovascular;  Laterality: N/A;   TRANSCATHETER AORTIC VALVE REPLACEMENT, TRANSFEMORAL  12/23/2019   TRANSCATHETER AORTIC VALVE REPLACEMENT, TRANSFEMORAL N/A 12/23/2019   Procedure: TRANSCATHETER AORTIC VALVE REPLACEMENT, TRANSFEMORAL;  Surgeon: Jose Bollman, MD;  Location: [facility] OR;  Service: Open Heart Surgery;  Laterality: N/A;   VASCULAR SURGERY     Patient Active Problem List   Diagnosis Date Noted   Hypertension    Severe aortic stenosis 10/08/2019   Candidiasis    Abnormality of gait    Hypoalbuminemia due to protein-calorie malnutrition [facility])    History of gout    Sleep disturbance    Constipation    Rupture of quadriceps tendon, right, sequela 06/19/2017   Quadriceps tendon rupture, left, sequela 06/19/2017   Acute blood loss anemia 06/19/2017   Fall    History of subdural hematoma    Post-operative pain    Recurrent falls    Quadriceps tendon rupture 06/15/2017   Primary osteoarthritis of both knees 02/28/2017   Primary osteoarthritis of both hands 02/28/2017   Uricacidemia 02/28/2017   Pain in joint of left knee 02/07/2017   Idiopathic chronic gout, unspecified site, without tophus (tophi)  11/29/2016   HEMATURIA UNSPECIFIED 01/25/2009   Abdominal pain 01/25/2009   XERODERMA 03/10/2008   UNS ADVRS EFF UNS RX MEDICINAL&BIOLOGICAL SBSTNC 03/10/2008   ADJUSTMENT REACTION WITH PHYSICAL SYMPTOMS 02/14/2008   MUSCLE SPASM 02/14/2008   ACUTE PROSTATITIS 12/16/2007   BREAST LUMP OR MASS, RIGHT 07/12/2007   H/O: RCT (rotator cuff tear) 01/13/2007   GOUT 01/08/2007    PCP: Jose Reaper, MD   REFERRING PROVIDER: Cammy Copa, MD   REFERRING DIAG:  [phone] (ICD-10-CM) - Synovitis of left knee  M19.011 (ICD-10-CM) - Primary osteoarthritis, right shoulder  M54.50 (ICD-10-CM) - Low back pain, unspecified back pain laterality,  unspecified chronicity, unspecified whether sciatica present    THERAPY DIAG:  Chronic right shoulder pain  Chronic pain of left knee  Other low back pain  Muscle weakness (generalized)  Rationale for Evaluation and Treatment: Rehabilitation  ONSET DATE: see subjective, on-going for years    PERTINENT HISTORY: HTN, severe aortic stenosis, abnormality of gait, h/o gout, rupture of quadriceps tendon on Rt 2018 on left 2018, subdural hematoma 2016, h/o falls, primary OA in bil hands Polio on Left side affecting Left UE strength  SUBJECTIVE:    SUBJECTIVE STATEMENT: Jose [name] notes right shoulder arthritis.  He has received 2 previous cortisone shots, the last one a month ago.  His right shoulder wakes him up at night.  He is low back pain dates back to a slip off a gurney in 2018.  He reports his knees are doing much better since starting physical therapy.   EVAL:  Pt stating he has been painting a lot at home and feels it may have exacerbated his Rt shoulder. Pt reporting radiation pain down his arm but no numbness/tingling. Pt stating Rt shoulder is giving him the most problem. Pt stating after getting the second shingles shot his shoulder pain increased into tricep and shoulder. Pt stating pain has been ongoing for about a year. Pt stating pain is worse at night.  Pt stating left knee pain has been ongoing since January 2018 when he was skiing and twisted his knee and he tore his quadriceps tendon. Pt stating he fell and when he fell he tore both tendons in bil LE. Pt then stating the repair didn't take on his left LE and it was repaired again. Pt stating he had PT and he gained ROM back in bil LE.  Pt reporting falling while in hospital during a bed transfer where he landed on his floor on his back in 2018. Pt stating his back has bothered him on/off ever since.  Pt stating he hasn't been able to work out over the past few months due to recent move into [place].   PAIN:  NPRS  scale: As high as 6/10 in the rt shoulder, 6/10 in the low back Pain description: achy, throbbing Aggravating factors: resting, tylenol, voltaren Relieving factors: Reaching, overhead function, spine flexion and slouched postures  PRECAUTIONS: None  WEIGHT BEARING RESTRICTIONS: No  FALLS:  Has patient fallen in last 6 months? No falls in last few years. Haven't fell since he tore his quad tendons and during a hospital transfer several years ago  LIVING ENVIRONMENT: Lives with: lives with their family and lives with an adult companion Lives in: House/apartment Stairs: Yes: Internal: 17 steps; on left going up and External: 2 steps; on left going up Has following equipment at home: None  OCCUPATION: retired  PLOF: Independent  PATIENT GOALS: Be able to work out at gym with less pain  Next MD visit:   OBJECTIVE:   DIAGNOSTIC FINDINGS: Shoulder: 08/06/23  3 views of the right shoulder including Grashey, scapular Y and axial view  were ordered and reviewed by myself.  X-rays show severe osteoarthritis of  the right glenohumeral joint.  There is a large inferior spur off the  humeral head.  There is some chondrocalcinosis superior to the humeral  head, likely indicative of pseudogout or prior gout.  No acute bony  fracture noted.  There is bony sclerosis noted of the humeral head and  inferior acetabulum from likely arthritic change.   Knee 05/22/23:   1. Postsurgical changes of quadriceps tendon repair. The tendon appears intact. Resolution of the prior small fluid bright signal within the midsubstance of the quadriceps tendon insertion on prior 10/01/2018 MRI. 2. Stable chronic oblique tear of the anterior horn of the lateral meniscus. Mild degenerative undersurface fraying of the posterior horn of the lateral meniscus. 3. Interval worsening of moderate to severe patellofemoral cartilage degenerative changes. 4. Mildly worsened cartilage loss within the anteromedial aspect  of the weight-bearing lateral femoral condyle and adjacent lateral tibial plateau. 5. Unchanged high-grade partial to full-thickness cartilage loss within the weight-bearing medial femoral condyle.  Lumbar: 05/22/23:  AP lateral radiographs lumbar spine reviewed.  Grade 1-2 spondylolisthesis present at L5-S1 with pars fracture noted at the corresponding level.   Mild to moderate degenerative changes present in the upper and and middle lumbar spine region.  Visualized hips without arthritis    PATIENT SURVEYS:  12/05/23: FOTO intake:  49%  predicted:  51% 12/31/23: FOTO update 48%  02/27/24: FOTO update 57%  COGNITION: Overall cognitive status: WFL    SENSATION: WFL   POSTURE:  rounded shoulders, forward head, and decreased lumbar lordosis  PALPATION: TTP: distal left quad tendon, Rt upper trap and levator and anterior shoulder  MMT   Right 12/04/23 Left 12/04/23 Rt  / Left 12/18/23 HHD ppsi Right 02/29/2024  Hip flexion 5 5    Hip extension 5 5    Hip abduction 5 5    Hip adduction 5 5    Hip internal rotation      Hip external rotation      Knee flexion 5 5 52.5 / 40.0   Knee extension 5 5 54.4 / 65.5                    SHOULDER       Shoulder flexion 4 3    Shoulder abduction 4 3    Shoulder ER 4- 3  23.0 pounds  Shoulder IR 4- 3  33.4 pounds   (Blank rows = not tested)    ROM  LE Right 12/04/23 Left 12/04/23 Rt  01/22/24 Left 01/22/24 Active supine 02/18/24 Rt  Active standing 02/29/2024 Right  Hip flexion 108 110 112 112 140    Hip extension        Hip abduction        Hip adduction        Hip internal rotation        Hip external rotation        Knee flexion 134 126      Knee extension 0 0       SHOULDER:          Shoulder flexion 138 158 145 155 155 150  Shoulder abduction 125 140   130   Shoulder ER 30 60   52 50  Shoulder IR  20  Shoulder horizontal adduction      35   (Blank rows = not tested)    FUNCTIONAL TESTS:    12/04/23 : 5 time sit to stand: 17.2 seconds no UE support 01/22/24: 5 time sit to stand: 9.2 seconds no UE support 02/18/24:  10.1 seconds no UE support  GAIT: Distance walked: clinic distances, level surface Assistive device utilized: None Level of assistance: Complete Independence Comments: mild forward flexed trunk posture                                                                                                                                                                       TODAY'S TREATMENT                                                                           DATE:  02/29/2024 Recumbent bike Seat 9 for 5 minutes Supine shoulder IR/ER stretch 20 x 10 seconds Scapular retraction/shoulder blade pinches 10 x 5 seconds  Functional Activities: Brief objective shoulder reassessment; reviewed spine anatomy, postural basics, the importance of avoiding prolonged postures (particularly sitting) and flexed postures   02/27/24 TherEx:  Recumbent bike: level 4 x 6 minutes Standing Row: level 4, 2 x 15 holding 3 Standing shoulder extension: level 4, x 15 holding 3 sec Supine trunk rotation: x 3 holding 30 sec Supine shoulder flexion c 2# bar 2 x 10 Supine Rt shoulder ER x 15 AAROM c 2# bar Supine bil shoulder protraction x 15 c 2# bar TherActivites:  Leg Press: 100# 2 x 15 , single LE 43# 2 x 10 Sit to stand: x 10    02/18/24 TherEx:  Nustep: Level 5 x 6 minutes Sitting: hamstring stretch: x bil LE holding 30 sec Supine low trunk rotations 5 sec X 10 bilat Supine SKTC stretch holding 30 sec Prone: hip extension x 10 bil LE, holding 3 sec Prone hamstring curls: green TB 2 x 10 bil LE Theractivity Rows: BATCA 10# each side 2X15 bilat BATCA: attempted lat pull downs but pt with increased shoulder pain Leg press: 100# 2 x 15, single LE: 56# 2 x 15 Sit to stand: x 5 no UE support  Stepping over 6 inch hurdles x 6 (3 hurdles)     PATIENT EDUCATION:  Education  details: HEP, POC Person educated: Patient Education method: Programmer, multimedia, Demonstration, Verbal cues, and Handouts Education comprehension: verbalized understanding, returned demonstration, and verbal cues required  HOME EXERCISE PROGRAM: Access  Code: [email or web address] URL: [email or web address] Date: 02/29/2024 Prepared by: Pauletta Browns  Exercises - Standing Shoulder Row with Anchored Resistance  - 1 x daily - 7 x weekly - 3 sets - 10 reps - Supine Bridge  - 2 x daily - 7 x weekly - 2 sets - 10 reps - 5 seconds hold - Supine Shoulder External Rotation in 45 Degrees Abduction AAROM with Dowel  - 1 x daily - 7 x weekly - 3 sets - 10 reps - Supine Shoulder Flexion Extension AAROM with Dowel  - 2-3 x daily - 7 x weekly - 2 sets - 10 reps - Seated Small Alternating Straight Leg Lifts with Heel Touch  - 2 x daily - 7 x weekly - 15-20 reps - Recumbent Bike  - 2-3 x daily - 7 x weekly - Bridge with Hip Abduction and Resistance  - 1-2 x daily - 7 x weekly - 2 sets - 10 reps - Supine Shoulder External Rotation Stretch  - 2 x daily - 7 x weekly - 1 sets - 10-20 reps - 10 seconds hold - Supine Shoulder Internal Rotation  - 2 x daily - 7 x weekly - 1 sets - 10-20 reps - 10 seconds hold - Standing Scapular Retraction  - 5 x daily - 7 x weekly - 1 sets - 5 reps - 5 second hold  ASSESSMENT:  CLINICAL IMPRESSION: Davis is happy with the progress with his knees with supervised physical therapy.  He wanted to focus more on his right shoulder and low back today.  We spent quite a bit of time on his new exercises which should help improve a very tight right shoulder capsule.  Improving capsular flexibility should decrease impingement and allow Claudell to be more comfortable with reaching overhead function.  We also spent quite a bit of time on education of the spine and I encouraged Dervin not to flex at the trunk or spend more time than necessary sitting.  We discussed the importance of a regular home  walking program and this will be reinforced on his next visit.  OBJECTIVE IMPAIRMENTS: difficulty walking, decreased ROM, decreased strength, increased edema, impaired flexibility, impaired UE functional use, postural dysfunction, and pain.   ACTIVITY LIMITATIONS: lifting, bending, standing, squatting, sleeping, stairs, transfers, reach over head, and hygiene/grooming  PARTICIPATION LIMITATIONS: cleaning, community activity, and yard work  PERSONAL FACTORS: 3+ comorbidities: see pertinent history  are also affecting patient's functional outcome.   REHAB POTENTIAL: Good  CLINICAL DECISION MAKING: Evolving/moderate complexity  EVALUATION COMPLEXITY: High   GOALS: Goals reviewed with patient? Yes  SHORT TERM GOALS: (target date for Short term goals are 3 weeks 12/25/2023)   1.  Patient will demonstrate independent use of home exercise program to maintain progress from in clinic treatments.  Goal status:MET 02/18/24  2. Pt to improve his 5 time sit to stand to </=  13  seconds with UE support.   Goal Status: MET 02/18/24   LONG TERM GOALS: (target dates for all long term goals are 6 weeks 04/04/24)   1. Patient will demonstrate/report pain at worst less than or equal to 2/10 to facilitate minimal limitation in daily activity secondary to pain symptoms.  Goal status: On Going 02/29/2024   2. Patient will demonstrate independent use of home exercise program to facilitate ability to maintain/progress functional gains from skilled physical therapy services.  Goal status: New   3. Patient will demonstrate FOTO outcome > or = 51 % to indicate reduced disability due  to condition.  49.5% FOTO 02/18/24 Goal status: New   4.  Patient will be able to perform up and down 1 flight of stairs with reciprocal gait pattern with left knee pain and low back pain </= 2/10.   Goal status: New   5.  Patient will demonstrate improved Rt UE flexion to >/= 150 degrees in order to improve functional  mobility.   Goal status: Met 02/29/2024   6.  Pt will improve Rt shoulder ER to >/= 60 deg in order to improve functional mobility.  Goal status: On Going 02/29/2024      PLAN:  PT FREQUENCY: 1-2x/week  PT DURATION: 6 weeks  PLANNED INTERVENTIONS: Can include 16109- PT Re-evaluation, 97110-Therapeutic exercises, 97530- Therapeutic activity, 97112- Neuromuscular re-education, 97535- Self Care, 97140- Manual therapy, [phone]- Gait training, [phone]- Orthotic Fit/training, [phone]- Canalith repositioning, [id number]- Aquatic Therapy, 97014- Electrical stimulation (unattended), [id number]- Electrical stimulation (manual), [id number]- Vasopneumatic device, [id number]- Ultrasound, [id number]- Traction (mechanical), [id number]- Ionotophoresis 4mg /ml Dexamethasone, Patient/Family education, Balance training, Stair training, Taping, Dry Needling, Joint mobilization, Joint manipulation, Spinal manipulation, Spinal mobilization, Scar mobilization, Vestibular training, Visual/preceptual remediation/compensation, DME instructions, Cryotherapy, and Moist heat.  All performed as medically necessary.  All included unless contraindicated  PLAN FOR NEXT SESSION:  Shoulder and back exercises, capsular stretching, body mechanics, low back and postural strengthening, try percussion or STM as needed  Re-certification: 02/18/24 for 6 more weeks   Cherlyn Cushing, PT, MPT 02/29/2024, 12:07 PM

## 2024-03-03 ENCOUNTER — Encounter: Payer: Self-pay | Admitting: Physical Therapy

## 2024-03-03 ENCOUNTER — Ambulatory Visit (INDEPENDENT_AMBULATORY_CARE_PROVIDER_SITE_OTHER): Admitting: Physical Therapy

## 2024-03-03 DIAGNOSIS — M25511 Pain in right shoulder: Secondary | ICD-10-CM | POA: Diagnosis not present

## 2024-03-03 DIAGNOSIS — M6281 Muscle weakness (generalized): Secondary | ICD-10-CM

## 2024-03-03 DIAGNOSIS — M25562 Pain in left knee: Secondary | ICD-10-CM

## 2024-03-03 DIAGNOSIS — G8929 Other chronic pain: Secondary | ICD-10-CM

## 2024-03-03 DIAGNOSIS — M5459 Other low back pain: Secondary | ICD-10-CM

## 2024-03-03 NOTE — Therapy (Signed)
 OUTPATIENT PHYSICAL THERAPY TREATMENT    Patient Name: Jose [name] MRN: [id number] DOB:1951/01/06, 73 y.o., male Today's Date: 03/03/2024  END OF SESSION:  PT End of Session - 03/03/24 0946     Visit Number 13    Number of Visits 22    Date for PT Re-Evaluation 04/04/24    PT Start Time 0930    PT Stop Time 1012    PT Time Calculation (min) 42 min    Activity Tolerance Patient tolerated treatment well;No increased pain    Behavior During Therapy WFL for tasks assessed/performed                   Past Medical History:  Diagnosis Date   Arthritis    R shoulder, great toes, hands- has gout & has injections in knees with Dr. Corliss Skains    Bicuspid aortic valve with ascending aorta 4.0 to 4.5 cm in diameter    CAD (coronary artery disease)    Cancer (HCC)    skin ca-    GERD (gastroesophageal reflux disease)    Headache    History of hiatal hernia    Hypertension    Prostate cancer (HCC)    S/P TAVR (transcatheter aortic valve replacement)    SDH (subdural hematoma) (HCC)    Severe aortic stenosis    Thoracic ascending aortic aneurysm (HCC)    Vertebral artery aneurysm [facility])    Past Surgical History:  Procedure Laterality Date   BRAIN SURGERY  2016   evacuation of SDH   CARDIAC CATHETERIZATION     CARPAL TUNNEL RELEASE Bilateral    embolization of left vertebral artery aneurysm with pipeline/coil  10/31/2022   GREEN LIGHT LASER TURP (TRANSURETHRAL RESECTION OF PROSTATE  04/27/2017   KNEE ARTHROPLASTY     LEFT HEART CATH AND CORONARY ANGIOGRAPHY N/A 10/08/2019   Procedure: LEFT HEART CATH AND CORONARY ANGIOGRAPHY;  Surgeon: Tonny Bollman, MD;  Location: [facility] INVASIVE CV LAB;  Service: Cardiovascular;  Laterality: N/A;   QUADRICEPS TENDON REPAIR Bilateral 06/15/2017   Procedure: REPAIR QUADRICEP TENDON;  Surgeon: Cammy Copa, MD;  Location: [facility] OR;  Service: Orthopedics;  Laterality: Bilateral;   SHOULDER SURGERY Right    TEE WITHOUT CARDIOVERSION  N/A 07/03/2022   Procedure: TRANSESOPHAGEAL ECHOCARDIOGRAM (TEE);  Surgeon: Wendall Stade, MD;  Location: [facility] ENDOSCOPY;  Service: Cardiovascular;  Laterality: N/A;   TRANSCATHETER AORTIC VALVE REPLACEMENT, TRANSFEMORAL  12/23/2019   TRANSCATHETER AORTIC VALVE REPLACEMENT, TRANSFEMORAL N/A 12/23/2019   Procedure: TRANSCATHETER AORTIC VALVE REPLACEMENT, TRANSFEMORAL;  Surgeon: Tonny Bollman, MD;  Location: [facility] OR;  Service: Open Heart Surgery;  Laterality: N/A;   VASCULAR SURGERY     Patient Active Problem List   Diagnosis Date Noted   Hypertension    Severe aortic stenosis 10/08/2019   Candidiasis    Abnormality of gait    Hypoalbuminemia due to protein-calorie malnutrition [facility])    History of gout    Sleep disturbance    Constipation    Rupture of quadriceps tendon, right, sequela 06/19/2017   Quadriceps tendon rupture, left, sequela 06/19/2017   Acute blood loss anemia 06/19/2017   Fall    History of subdural hematoma    Post-operative pain    Recurrent falls    Quadriceps tendon rupture 06/15/2017   Primary osteoarthritis of both knees 02/28/2017   Primary osteoarthritis of both hands 02/28/2017   Uricacidemia 02/28/2017   Pain in joint of left knee 02/07/2017   Idiopathic chronic gout, unspecified site, without tophus (tophi)  11/29/2016   HEMATURIA UNSPECIFIED 01/25/2009   Abdominal pain 01/25/2009   XERODERMA 03/10/2008   UNS ADVRS EFF UNS RX MEDICINAL&BIOLOGICAL SBSTNC 03/10/2008   ADJUSTMENT REACTION WITH PHYSICAL SYMPTOMS 02/14/2008   MUSCLE SPASM 02/14/2008   ACUTE PROSTATITIS 12/16/2007   BREAST LUMP OR MASS, RIGHT 07/12/2007   H/O: RCT (rotator cuff tear) 01/13/2007   GOUT 01/08/2007    PCP: Earleen Reaper, MD   REFERRING PROVIDER: Cammy Copa, MD   REFERRING DIAG:  [phone] (ICD-10-CM) - Synovitis of left knee  M19.011 (ICD-10-CM) - Primary osteoarthritis, right shoulder  M54.50 (ICD-10-CM) - Low back pain, unspecified back pain laterality,  unspecified chronicity, unspecified whether sciatica present    THERAPY DIAG:  Chronic right shoulder pain  Chronic pain of left knee  Other low back pain  Muscle weakness (generalized)  Rationale for Evaluation and Treatment: Rehabilitation  ONSET DATE: see subjective, on-going for years    PERTINENT HISTORY: HTN, severe aortic stenosis, abnormality of gait, h/o gout, rupture of quadriceps tendon on Rt 2018 on left 2018, subdural hematoma 2016, h/o falls, primary OA in bil hands Polio on Left side affecting Left UE strength  SUBJECTIVE:    SUBJECTIVE STATEMENT: Jose [name] notes right shoulder arthritis.  He has received 2 previous cortisone shots, the last one a month ago.  His right shoulder wakes him up at night.  He is low back pain dates back to a slip off a gurney in 2018.  He reports his knees are doing much better since starting physical therapy.   EVAL:  Pt stating he has been painting a lot at home and feels it may have exacerbated his Rt shoulder. Pt reporting radiation pain down his arm but no numbness/tingling. Pt stating Rt shoulder is giving him the most problem. Pt stating after getting the second shingles shot his shoulder pain increased into tricep and shoulder. Pt stating pain has been ongoing for about a year. Pt stating pain is worse at night.  Pt stating left knee pain has been ongoing since January 2018 when he was skiing and twisted his knee and he tore his quadriceps tendon. Pt stating he fell and when he fell he tore both tendons in bil LE. Pt then stating the repair didn't take on his left LE and it was repaired again. Pt stating he had PT and he gained ROM back in bil LE.  Pt reporting falling while in hospital during a bed transfer where he landed on his floor on his back in 2018. Pt stating his back has bothered him on/off ever since.  Pt stating he hasn't been able to work out over the past few months due to recent move into [place].   PAIN:  NPRS  scale: As high as 6/10 in the rt shoulder, 6/10 in the low back Pain description: achy, throbbing Aggravating factors: resting, tylenol, voltaren Relieving factors: Reaching, overhead function, spine flexion and slouched postures  PRECAUTIONS: None  WEIGHT BEARING RESTRICTIONS: No  FALLS:  Has patient fallen in last 6 months? No falls in last few years. Haven't fell since he tore his quad tendons and during a hospital transfer several years ago  LIVING ENVIRONMENT: Lives with: lives with their family and lives with an adult companion Lives in: House/apartment Stairs: Yes: Internal: 17 steps; on left going up and External: 2 steps; on left going up Has following equipment at home: None  OCCUPATION: retired  PLOF: Independent  PATIENT GOALS: Be able to work out at gym with less pain  Next MD visit:   OBJECTIVE:   DIAGNOSTIC FINDINGS: Shoulder: 08/06/23  3 views of the right shoulder including Grashey, scapular Y and axial view  were ordered and reviewed by myself.  X-rays show severe osteoarthritis of  the right glenohumeral joint.  There is a large inferior spur off the  humeral head.  There is some chondrocalcinosis superior to the humeral  head, likely indicative of pseudogout or prior gout.  No acute bony  fracture noted.  There is bony sclerosis noted of the humeral head and  inferior acetabulum from likely arthritic change.   Knee 05/22/23:   1. Postsurgical changes of quadriceps tendon repair. The tendon appears intact. Resolution of the prior small fluid bright signal within the midsubstance of the quadriceps tendon insertion on prior 10/01/2018 MRI. 2. Stable chronic oblique tear of the anterior horn of the lateral meniscus. Mild degenerative undersurface fraying of the posterior horn of the lateral meniscus. 3. Interval worsening of moderate to severe patellofemoral cartilage degenerative changes. 4. Mildly worsened cartilage loss within the anteromedial aspect  of the weight-bearing lateral femoral condyle and adjacent lateral tibial plateau. 5. Unchanged high-grade partial to full-thickness cartilage loss within the weight-bearing medial femoral condyle.  Lumbar: 05/22/23:  AP lateral radiographs lumbar spine reviewed.  Grade 1-2 spondylolisthesis present at L5-S1 with pars fracture noted at the corresponding level.   Mild to moderate degenerative changes present in the upper and and middle lumbar spine region.  Visualized hips without arthritis    PATIENT SURVEYS:  12/05/23: FOTO intake:  49%  predicted:  51% 12/31/23: FOTO update 48%  02/27/24: FOTO update 57%  COGNITION: Overall cognitive status: WFL    SENSATION: WFL   POSTURE:  rounded shoulders, forward head, and decreased lumbar lordosis  PALPATION: TTP: distal left quad tendon, Rt upper trap and levator and anterior shoulder  MMT   Right 12/04/23 Left 12/04/23 Rt  / Left 12/18/23 HHD ppsi Right 02/29/2024  Hip flexion 5 5    Hip extension 5 5    Hip abduction 5 5    Hip adduction 5 5    Hip internal rotation      Hip external rotation      Knee flexion 5 5 52.5 / 40.0   Knee extension 5 5 54.4 / 65.5                    SHOULDER       Shoulder flexion 4 3    Shoulder abduction 4 3    Shoulder ER 4- 3  23.0 pounds  Shoulder IR 4- 3  33.4 pounds   (Blank rows = not tested)    ROM  LE Right 12/04/23 Left 12/04/23 Rt  01/22/24 Left 01/22/24 Active supine 02/18/24 Rt  Active standing 02/29/2024 Right  Hip flexion 108 110 112 112 140    Hip extension        Hip abduction        Hip adduction        Hip internal rotation        Hip external rotation        Knee flexion 134 126      Knee extension 0 0       SHOULDER:          Shoulder flexion 138 158 145 155 155 150  Shoulder abduction 125 140   130   Shoulder ER 30 60   52 50  Shoulder IR  20  Shoulder horizontal adduction      35   (Blank rows = not tested)    FUNCTIONAL TESTS:    12/04/23 : 5 time sit to stand: 17.2 seconds no UE support 01/22/24: 5 time sit to stand: 9.2 seconds no UE support 02/18/24:  10.1 seconds no UE support  GAIT: Distance walked: clinic distances, level surface Assistive device utilized: None Level of assistance: Complete Independence Comments: mild forward flexed trunk posture                                                                                                                                                                       TODAY'S TREATMENT                                                                           DATE:  03/03/24:  TherEx:  Nustep: level 6 x 13 minutes Gastroc stretch x 2 holding 20 sec Soleus stretch: x 2 holding 20 sec Supine IR stretch at 90 deg abd  x 15 holding 3 sec Supine ER at 90 deg abd x 15 holding 3 sec Side-lying sleeper stretch for anterior capsule x 10 holding 3 sec Supine ER at 45 deg using 1# bar x 10  Wall push up x 5  Rows: blue TB x 15 holding 5 sec scapular retraction There Activities:  Leg Press: 118# bil LE's  2 x 15, single leg 62# x 15 bil      02/29/2024 Recumbent bike Seat 9 for 5 minutes Supine shoulder IR/ER stretch 20 x 10 seconds Scapular retraction/shoulder blade pinches 10 x 5 seconds  Functional Activities: Brief objective shoulder reassessment; reviewed spine anatomy, postural basics, the importance of avoiding prolonged postures (particularly sitting) and flexed postures   02/27/24 TherEx:  Recumbent bike: level 4 x 6 minutes Standing Row: level 4, 2 x 15 holding 3 Standing shoulder extension: level 4, x 15 holding 3 sec Supine trunk rotation: x 3 holding 30 sec Supine shoulder flexion c 2# bar 2 x 10 Supine Rt shoulder ER x 15 AAROM c 2# bar Supine bil shoulder protraction x 15 c 2# bar TherActivites:  Leg Press: 100# 2 x 15 , single LE 43# 2 x 10 Sit to stand: x 10         PATIENT EDUCATION:  Education details: HEP, POC Person educated:  Patient Education method: Explanation, Demonstration, Verbal cues, and Handouts Education comprehension: verbalized understanding, returned demonstration, and  verbal cues required  HOME EXERCISE PROGRAM: Access Code: [email or web address] URL: [email or web address] Date: 02/29/2024 Prepared by: Pauletta Browns  Exercises - Standing Shoulder Row with Anchored Resistance  - 1 x daily - 7 x weekly - 3 sets - 10 reps - Supine Bridge  - 2 x daily - 7 x weekly - 2 sets - 10 reps - 5 seconds hold - Supine Shoulder External Rotation in 45 Degrees Abduction AAROM with Dowel  - 1 x daily - 7 x weekly - 3 sets - 10 reps - Supine Shoulder Flexion Extension AAROM with Dowel  - 2-3 x daily - 7 x weekly - 2 sets - 10 reps - Seated Small Alternating Straight Leg Lifts with Heel Touch  - 2 x daily - 7 x weekly - 15-20 reps - Recumbent Bike  - 2-3 x daily - 7 x weekly - Bridge with Hip Abduction and Resistance  - 1-2 x daily - 7 x weekly - 2 sets - 10 reps - Supine Shoulder External Rotation Stretch  - 2 x daily - 7 x weekly - 1 sets - 10-20 reps - 10 seconds hold - Supine Shoulder Internal Rotation  - 2 x daily - 7 x weekly - 1 sets - 10-20 reps - 10 seconds hold - Standing Scapular Retraction  - 5 x daily - 7 x weekly - 1 sets - 5 reps - 5 second hold  ASSESSMENT:  CLINICAL IMPRESSION: Pt arriving today reporting increased soreness following his last visit later that evening. Pt's HEP was reviewed and print out issued as well as instructions in how to download the free app with videos. Pt tolerating all exercises well this visit and pt was instruct to decrease reps and progress as tolerated. Recommending continued skilled PT interventions.   OBJECTIVE IMPAIRMENTS: difficulty walking, decreased ROM, decreased strength, increased edema, impaired flexibility, impaired UE functional use, postural dysfunction, and pain.   ACTIVITY LIMITATIONS: lifting, bending, standing, squatting, sleeping, stairs,  transfers, reach over head, and hygiene/grooming  PARTICIPATION LIMITATIONS: cleaning, community activity, and yard work  PERSONAL FACTORS: 3+ comorbidities: see pertinent history  are also affecting patient's functional outcome.   REHAB POTENTIAL: Good  CLINICAL DECISION MAKING: Evolving/moderate complexity  EVALUATION COMPLEXITY: High   GOALS: Goals reviewed with patient? Yes  SHORT TERM GOALS: (target date for Short term goals are 3 weeks 12/25/2023)   1.  Patient will demonstrate independent use of home exercise program to maintain progress from in clinic treatments.  Goal status:MET 02/18/24  2. Pt to improve his 5 time sit to stand to </=  13  seconds with UE support.   Goal Status: MET 02/18/24   LONG TERM GOALS: (target dates for all long term goals are 6 weeks 04/04/24)   1. Patient will demonstrate/report pain at worst less than or equal to 2/10 to facilitate minimal limitation in daily activity secondary to pain symptoms.  Goal status: On Going 02/29/2024   2. Patient will demonstrate independent use of home exercise program to facilitate ability to maintain/progress functional gains from skilled physical therapy services.  Goal status: New   3. Patient will demonstrate FOTO outcome > or = 51 % to indicate reduced disability due to condition.  49.5% FOTO 02/18/24 Goal status: New   4.  Patient will be able to perform up and down 1 flight of stairs with reciprocal gait pattern with left knee pain and low back pain </= 2/10.   Goal status: New   5.  Patient will demonstrate  improved Rt UE flexion to >/= 150 degrees in order to improve functional mobility.   Goal status: Met 02/29/2024   6.  Pt will improve Rt shoulder ER to >/= 60 deg in order to improve functional mobility.  Goal status: On Going 02/29/2024      PLAN:  PT FREQUENCY: 1-2x/week  PT DURATION: 6 weeks  PLANNED INTERVENTIONS: Can include 09811- PT Re-evaluation, 97110-Therapeutic exercises, 97530-  Therapeutic activity, 97112- Neuromuscular re-education, 97535- Self Care, 97140- Manual therapy, [phone]- Gait training, [phone]- Orthotic Fit/training, [phone]- Canalith repositioning, [id number]- Aquatic Therapy, 97014- Electrical stimulation (unattended), [id number]- Electrical stimulation (manual), [id number]- Vasopneumatic device, [id number]- Ultrasound, [id number]- Traction (mechanical), [id number]- Ionotophoresis 4mg /ml Dexamethasone, Patient/Family education, Balance training, Stair training, Taping, Dry Needling, Joint mobilization, Joint manipulation, Spinal manipulation, Spinal mobilization, Scar mobilization, Vestibular training, Visual/preceptual remediation/compensation, DME instructions, Cryotherapy, and Moist heat.  All performed as medically necessary.  All included unless contraindicated  PLAN FOR NEXT SESSION:  Shoulder and back exercises, capsular stretching, body mechanics, low back and postural strengthening, try percussion or STM as needed  Re-certification: 02/18/24 for 6 more weeks   Sharmon Leyden, PT, MPT 03/03/2024, 10:16 AM

## 2024-03-04 ENCOUNTER — Other Ambulatory Visit: Payer: Self-pay | Admitting: Physician Assistant

## 2024-03-04 NOTE — Telephone Encounter (Signed)
 Last Fill: 01/08/2024  Labs: 12/27/2023 CBC and CMP are normal.  Uric acid is in the desirable range   Next Visit: 06/25/2024  Last Visit: 12/27/2023  DX: Idiopathic chronic gout of multiple sites without tophus   Current Dose per office note 12/27/2023: Allopurinol 300 mg p.o. daily   Okay to refill Allopurinol?

## 2024-03-05 ENCOUNTER — Encounter: Payer: Self-pay | Admitting: Physical Therapy

## 2024-03-05 ENCOUNTER — Ambulatory Visit (INDEPENDENT_AMBULATORY_CARE_PROVIDER_SITE_OTHER): Admitting: Physical Therapy

## 2024-03-05 DIAGNOSIS — M6281 Muscle weakness (generalized): Secondary | ICD-10-CM | POA: Diagnosis not present

## 2024-03-05 DIAGNOSIS — M25562 Pain in left knee: Secondary | ICD-10-CM | POA: Diagnosis not present

## 2024-03-05 DIAGNOSIS — M5459 Other low back pain: Secondary | ICD-10-CM

## 2024-03-05 DIAGNOSIS — G8929 Other chronic pain: Secondary | ICD-10-CM

## 2024-03-05 DIAGNOSIS — M25511 Pain in right shoulder: Secondary | ICD-10-CM | POA: Diagnosis not present

## 2024-03-05 NOTE — Therapy (Signed)
 OUTPATIENT PHYSICAL THERAPY TREATMENT    Patient Name: Jose [name] MRN: [id number] DOB:Apr 24, 1951, 73 y.o., male Today's Date: 03/05/2024  END OF SESSION:  PT End of Session - 03/05/24 0937     Visit Number 14    Number of Visits 22    Date for PT Re-Evaluation 04/04/24    PT Start Time 0930    PT Stop Time 1010    PT Time Calculation (min) 40 min    Activity Tolerance Patient tolerated treatment well;No increased pain    Behavior During Therapy WFL for tasks assessed/performed                   Past Medical History:  Diagnosis Date   Arthritis    R shoulder, great toes, hands- has gout & has injections in knees with Dr. Corliss Skains    Bicuspid aortic valve with ascending aorta 4.0 to 4.5 cm in diameter    CAD (coronary artery disease)    Cancer (HCC)    skin ca-    GERD (gastroesophageal reflux disease)    Headache    History of hiatal hernia    Hypertension    Prostate cancer (HCC)    S/P TAVR (transcatheter aortic valve replacement)    SDH (subdural hematoma) (HCC)    Severe aortic stenosis    Thoracic ascending aortic aneurysm (HCC)    Vertebral artery aneurysm [facility])    Past Surgical History:  Procedure Laterality Date   BRAIN SURGERY  2016   evacuation of SDH   CARDIAC CATHETERIZATION     CARPAL TUNNEL RELEASE Bilateral    embolization of left vertebral artery aneurysm with pipeline/coil  10/31/2022   GREEN LIGHT LASER TURP (TRANSURETHRAL RESECTION OF PROSTATE  04/27/2017   KNEE ARTHROPLASTY     LEFT HEART CATH AND CORONARY ANGIOGRAPHY N/A 10/08/2019   Procedure: LEFT HEART CATH AND CORONARY ANGIOGRAPHY;  Surgeon: Tonny Bollman, MD;  Location: [facility] INVASIVE CV LAB;  Service: Cardiovascular;  Laterality: N/A;   QUADRICEPS TENDON REPAIR Bilateral 06/15/2017   Procedure: REPAIR QUADRICEP TENDON;  Surgeon: Cammy Copa, MD;  Location: [facility] OR;  Service: Orthopedics;  Laterality: Bilateral;   SHOULDER SURGERY Right    TEE WITHOUT CARDIOVERSION  N/A 07/03/2022   Procedure: TRANSESOPHAGEAL ECHOCARDIOGRAM (TEE);  Surgeon: Wendall Stade, MD;  Location: [facility] ENDOSCOPY;  Service: Cardiovascular;  Laterality: N/A;   TRANSCATHETER AORTIC VALVE REPLACEMENT, TRANSFEMORAL  12/23/2019   TRANSCATHETER AORTIC VALVE REPLACEMENT, TRANSFEMORAL N/A 12/23/2019   Procedure: TRANSCATHETER AORTIC VALVE REPLACEMENT, TRANSFEMORAL;  Surgeon: Tonny Bollman, MD;  Location: [facility] OR;  Service: Open Heart Surgery;  Laterality: N/A;   VASCULAR SURGERY     Patient Active Problem List   Diagnosis Date Noted   Hypertension    Severe aortic stenosis 10/08/2019   Candidiasis    Abnormality of gait    Hypoalbuminemia due to protein-calorie malnutrition [facility])    History of gout    Sleep disturbance    Constipation    Rupture of quadriceps tendon, right, sequela 06/19/2017   Quadriceps tendon rupture, left, sequela 06/19/2017   Acute blood loss anemia 06/19/2017   Fall    History of subdural hematoma    Post-operative pain    Recurrent falls    Quadriceps tendon rupture 06/15/2017   Primary osteoarthritis of both knees 02/28/2017   Primary osteoarthritis of both hands 02/28/2017   Uricacidemia 02/28/2017   Pain in joint of left knee 02/07/2017   Idiopathic chronic gout, unspecified site, without tophus (tophi)  11/29/2016   HEMATURIA UNSPECIFIED 01/25/2009   Abdominal pain 01/25/2009   XERODERMA 03/10/2008   UNS ADVRS EFF UNS RX MEDICINAL&BIOLOGICAL SBSTNC 03/10/2008   ADJUSTMENT REACTION WITH PHYSICAL SYMPTOMS 02/14/2008   MUSCLE SPASM 02/14/2008   ACUTE PROSTATITIS 12/16/2007   BREAST LUMP OR MASS, RIGHT 07/12/2007   H/O: RCT (rotator cuff tear) 01/13/2007   GOUT 01/08/2007    PCP: Earleen Reaper, MD   REFERRING PROVIDER: Cammy Copa, MD   REFERRING DIAG:  [phone] (ICD-10-CM) - Synovitis of left knee  M19.011 (ICD-10-CM) - Primary osteoarthritis, right shoulder  M54.50 (ICD-10-CM) - Low back pain, unspecified back pain laterality,  unspecified chronicity, unspecified whether sciatica present    THERAPY DIAG:  Chronic right shoulder pain  Chronic pain of left knee  Other low back pain  Muscle weakness (generalized)  Rationale for Evaluation and Treatment: Rehabilitation  ONSET DATE: see subjective, on-going for years    PERTINENT HISTORY: HTN, severe aortic stenosis, abnormality of gait, h/o gout, rupture of quadriceps tendon on Rt 2018 on left 2018, subdural hematoma 2016, h/o falls, primary OA in bil hands Polio on Left side affecting Left UE strength  SUBJECTIVE:    SUBJECTIVE STATEMENT: Pt arriving reporting soreness following working in the yard yesterday.    EVAL:  Pt stating he has been painting a lot at home and feels it may have exacerbated his Rt shoulder. Pt reporting radiation pain down his arm but no numbness/tingling. Pt stating Rt shoulder is giving him the most problem. Pt stating after getting the second shingles shot his shoulder pain increased into tricep and shoulder. Pt stating pain has been ongoing for about a year. Pt stating pain is worse at night.  Pt stating left knee pain has been ongoing since January 2018 when he was skiing and twisted his knee and he tore his quadriceps tendon. Pt stating he fell and when he fell he tore both tendons in bil LE. Pt then stating the repair didn't take on his left LE and it was repaired again. Pt stating he had PT and he gained ROM back in bil LE.  Pt reporting falling while in hospital during a bed transfer where he landed on his floor on his back in 2018. Pt stating his back has bothered him on/off ever since.  Pt stating he hasn't been able to work out over the past few months due to recent move into [place].   PAIN:  NPRS scale: 7/10 in his low back Pain description: achy, throbbing Aggravating factors: resting, tylenol, voltaren Relieving factors: Reaching, overhead function, spine flexion and slouched postures  PRECAUTIONS:  None  WEIGHT BEARING RESTRICTIONS: No  FALLS:  Has patient fallen in last 6 months? No falls in last few years. Haven't fell since he tore his quad tendons and during a hospital transfer several years ago  LIVING ENVIRONMENT: Lives with: lives with their family and lives with an adult companion Lives in: House/apartment Stairs: Yes: Internal: 17 steps; on left going up and External: 2 steps; on left going up Has following equipment at home: None  OCCUPATION: retired  PLOF: Independent  PATIENT GOALS: Be able to work out at gym with less pain  Next MD visit:   OBJECTIVE:   DIAGNOSTIC FINDINGS: Shoulder: 08/06/23  3 views of the right shoulder including Grashey, scapular Y and axial view  were ordered and reviewed by myself.  X-rays show severe osteoarthritis of  the right glenohumeral joint.  There is a large inferior spur off the  humeral head.  There is some chondrocalcinosis superior to the humeral  head, likely indicative of pseudogout or prior gout.  No acute bony  fracture noted.  There is bony sclerosis noted of the humeral head and  inferior acetabulum from likely arthritic change.   Knee 05/22/23:   1. Postsurgical changes of quadriceps tendon repair. The tendon appears intact. Resolution of the prior small fluid bright signal within the midsubstance of the quadriceps tendon insertion on prior 10/01/2018 MRI. 2. Stable chronic oblique tear of the anterior horn of the lateral meniscus. Mild degenerative undersurface fraying of the posterior horn of the lateral meniscus. 3. Interval worsening of moderate to severe patellofemoral cartilage degenerative changes. 4. Mildly worsened cartilage loss within the anteromedial aspect of the weight-bearing lateral femoral condyle and adjacent lateral tibial plateau. 5. Unchanged high-grade partial to full-thickness cartilage loss within the weight-bearing medial femoral condyle.  Lumbar: 05/22/23:  AP lateral radiographs  lumbar spine reviewed.  Grade 1-2 spondylolisthesis present at L5-S1 with pars fracture noted at the corresponding level.   Mild to moderate degenerative changes present in the upper and and middle lumbar spine region.  Visualized hips without arthritis    PATIENT SURVEYS:  12/05/23: FOTO intake:  49%  predicted:  51% 12/31/23: FOTO update 48%  02/27/24: FOTO update 57%  COGNITION: Overall cognitive status: WFL    SENSATION: WFL   POSTURE:  rounded shoulders, forward head, and decreased lumbar lordosis  PALPATION: TTP: distal left quad tendon, Rt upper trap and levator and anterior shoulder  MMT   Right 12/04/23 Left 12/04/23 Rt  / Left 12/18/23 HHD ppsi Right 02/29/2024  Hip flexion 5 5    Hip extension 5 5    Hip abduction 5 5    Hip adduction 5 5    Hip internal rotation      Hip external rotation      Knee flexion 5 5 52.5 / 40.0   Knee extension 5 5 54.4 / 65.5                    SHOULDER       Shoulder flexion 4 3    Shoulder abduction 4 3    Shoulder ER 4- 3  23.0 pounds  Shoulder IR 4- 3  33.4 pounds   (Blank rows = not tested)    ROM  LE Right 12/04/23 Left 12/04/23 Rt  01/22/24 Left 01/22/24 Active supine 02/18/24 Rt  Active standing 02/29/2024 Right  Hip flexion 108 110 112 112 140    Hip extension        Hip abduction        Hip adduction        Hip internal rotation        Hip external rotation        Knee flexion 134 126      Knee extension 0 0       SHOULDER:          Shoulder flexion 138 158 145 155 155 150  Shoulder abduction 125 140   130   Shoulder ER 30 60   52 50  Shoulder IR      20  Shoulder horizontal adduction      35   (Blank rows = not tested)    FUNCTIONAL TESTS:   12/04/23 : 5 time sit to stand: 17.2 seconds no UE support 01/22/24: 5 time sit to stand: 9.2 seconds no UE support 02/18/24:  10.1 seconds no  UE support  GAIT: Distance walked: clinic distances, level surface Assistive device utilized: None Level of  assistance: Complete Independence Comments: mild forward flexed trunk posture                                                                                                                                                                       TODAY'S TREATMENT                                                                           DATE:  03/05/24:  TherEx:  Recumbent bike: 10 minutes level 10 Supine SKTC x 3 holding 20 sec Supine IR/ER  shoulder abd 90 deg, shoulder stretch x 3 holding 10-15  There Activities:  Leg Press: 112# bil LE's  3x 10, single leg 56# 2 x 10 bil (pt reporting previous weight was too much today due to increased pain in his ow back) Lifting 12# from 6 inch step  x 5 with corrected body mechanics Diagonal pulls (down to up across the body) x 10 green TB bil  Wood chops green TB x 10 bil     03/03/24:  TherEx:  Nustep: level 6 x 13 minutes Gastroc stretch x 2 holding 20 sec Soleus stretch: x 2 holding 20 sec Supine IR stretch at 90 deg abd  x 15 holding 3 sec Supine ER at 90 deg abd x 15 holding 3 sec Side-lying sleeper stretch for anterior capsule x 10 holding 3 sec Supine ER at 45 deg using 1# bar x 10  Wall push up x 5  Rows: blue TB x 15 holding 5 sec scapular retraction There Activities:  Leg Press: 118# bil LE's  2 x 15, single leg 62# x 15 bil      02/29/2024 Recumbent bike Seat 9 for 5 minutes Supine shoulder IR/ER stretch 20 x 10 seconds Scapular retraction/shoulder blade pinches 10 x 5 seconds  Functional Activities: Brief objective shoulder reassessment; reviewed spine anatomy, postural basics, the importance of avoiding prolonged postures (particularly sitting) and flexed postures   02/27/24 TherEx:  Recumbent bike: level 4 x 6 minutes Standing Row: level 4, 2 x 15 holding 3 Standing shoulder extension: level 4, x 15 holding 3 sec Supine trunk rotation: x 3 holding 30 sec Supine shoulder flexion c 2# bar 2 x 10 Supine Rt shoulder ER x  15 AAROM c 2# bar Supine bil shoulder protraction x 15 c 2# bar TherActivites:  Leg  Press: 100# 2 x 15 , single LE 43# 2 x 10 Sit to stand: x 10         PATIENT EDUCATION:  Education details: HEP, POC Person educated: Patient Education method: Programmer, multimedia, Demonstration, Verbal cues, and Handouts Education comprehension: verbalized understanding, returned demonstration, and verbal cues required  HOME EXERCISE PROGRAM: Access Code: [email or web address] URL: [email or web address] Date: 02/29/2024 Prepared by: Pauletta Browns  Exercises - Standing Shoulder Row with Anchored Resistance  - 1 x daily - 7 x weekly - 3 sets - 10 reps - Supine Bridge  - 2 x daily - 7 x weekly - 2 sets - 10 reps - 5 seconds hold - Supine Shoulder External Rotation in 45 Degrees Abduction AAROM with Dowel  - 1 x daily - 7 x weekly - 3 sets - 10 reps - Supine Shoulder Flexion Extension AAROM with Dowel  - 2-3 x daily - 7 x weekly - 2 sets - 10 reps - Seated Small Alternating Straight Leg Lifts with Heel Touch  - 2 x daily - 7 x weekly - 15-20 reps - Recumbent Bike  - 2-3 x daily - 7 x weekly - Bridge with Hip Abduction and Resistance  - 1-2 x daily - 7 x weekly - 2 sets - 10 reps - Supine Shoulder External Rotation Stretch  - 2 x daily - 7 x weekly - 1 sets - 10-20 reps - 10 seconds hold - Supine Shoulder Internal Rotation  - 2 x daily - 7 x weekly - 1 sets - 10-20 reps - 10 seconds hold - Standing Scapular Retraction  - 5 x daily - 7 x weekly - 1 sets - 5 reps - 5 second hold  ASSESSMENT:  CLINICAL IMPRESSION: Pt reporting more back pain at the beginning of this visit. Pt tolerating more functional movements during today's session which included more rotation trunk and hip rotation. Recommend continued skilled PT interventions.   OBJECTIVE IMPAIRMENTS: difficulty walking, decreased ROM, decreased strength, increased edema, impaired flexibility, impaired UE functional use, postural dysfunction, and pain.    ACTIVITY LIMITATIONS: lifting, bending, standing, squatting, sleeping, stairs, transfers, reach over head, and hygiene/grooming  PARTICIPATION LIMITATIONS: cleaning, community activity, and yard work  PERSONAL FACTORS: 3+ comorbidities: see pertinent history  are also affecting patient's functional outcome.   REHAB POTENTIAL: Good  CLINICAL DECISION MAKING: Evolving/moderate complexity  EVALUATION COMPLEXITY: High   GOALS: Goals reviewed with patient? Yes  SHORT TERM GOALS: (target date for Short term goals are 3 weeks 12/25/2023)   1.  Patient will demonstrate independent use of home exercise program to maintain progress from in clinic treatments.  Goal status:MET 02/18/24  2. Pt to improve his 5 time sit to stand to </=  13  seconds with UE support.   Goal Status: MET 02/18/24   LONG TERM GOALS: (target dates for all long term goals are 6 weeks 04/04/24)   1. Patient will demonstrate/report pain at worst less than or equal to 2/10 to facilitate minimal limitation in daily activity secondary to pain symptoms.  Goal status: On Going 02/29/2024   2. Patient will demonstrate independent use of home exercise program to facilitate ability to maintain/progress functional gains from skilled physical therapy services.  Goal status: New   3. Patient will demonstrate FOTO outcome > or = 51 % to indicate reduced disability due to condition.  49.5% FOTO 02/18/24 Goal status: New   4.  Patient will be able to perform up and down 1 flight of stairs with  reciprocal gait pattern with left knee pain and low back pain </= 2/10.   Goal status: New   5.  Patient will demonstrate improved Rt UE flexion to >/= 150 degrees in order to improve functional mobility.   Goal status: Met 02/29/2024   6.  Pt will improve Rt shoulder ER to >/= 60 deg in order to improve functional mobility.  Goal status: On Going 02/29/2024      PLAN:  PT FREQUENCY: 1-2x/week  PT DURATION: 6 weeks  PLANNED  INTERVENTIONS: Can include 93235- PT Re-evaluation, 97110-Therapeutic exercises, 97530- Therapeutic activity, 97112- Neuromuscular re-education, 97535- Self Care, 97140- Manual therapy, [phone]- Gait training, [phone]- Orthotic Fit/training, [phone]- Canalith repositioning, [id number]- Aquatic Therapy, 97014- Electrical stimulation (unattended), [id number]- Electrical stimulation (manual), [id number]- Vasopneumatic device, [id number]- Ultrasound, [id number]- Traction (mechanical), [id number]- Ionotophoresis 4mg /ml Dexamethasone, Patient/Family education, Balance training, Stair training, Taping, Dry Needling, Joint mobilization, Joint manipulation, Spinal manipulation, Spinal mobilization, Scar mobilization, Vestibular training, Visual/preceptual remediation/compensation, DME instructions, Cryotherapy, and Moist heat.  All performed as medically necessary.  All included unless contraindicated  PLAN FOR NEXT SESSION:  Shoulder and back exercises, capsular stretching, body mechanics, low back and postural strengthening, try percussion or STM as needed  Re-certification: 02/18/24 for 6 more weeks   Sharmon Leyden, PT, MPT 03/05/2024, 10:20 AM

## 2024-03-10 ENCOUNTER — Ambulatory Visit (INDEPENDENT_AMBULATORY_CARE_PROVIDER_SITE_OTHER): Admitting: Physical Therapy

## 2024-03-10 ENCOUNTER — Encounter: Payer: Self-pay | Admitting: Physical Therapy

## 2024-03-10 DIAGNOSIS — M6281 Muscle weakness (generalized): Secondary | ICD-10-CM | POA: Diagnosis not present

## 2024-03-10 DIAGNOSIS — G8929 Other chronic pain: Secondary | ICD-10-CM

## 2024-03-10 DIAGNOSIS — M25562 Pain in left knee: Secondary | ICD-10-CM

## 2024-03-10 DIAGNOSIS — M5459 Other low back pain: Secondary | ICD-10-CM | POA: Diagnosis not present

## 2024-03-10 DIAGNOSIS — M25511 Pain in right shoulder: Secondary | ICD-10-CM

## 2024-03-10 NOTE — Therapy (Signed)
 OUTPATIENT PHYSICAL THERAPY TREATMENT    Patient Name: Jose [name] MRN: [id number] DOB:09/19/1951, 73 y.o., male Today's Date: 03/10/2024  END OF SESSION:  PT End of Session - 03/10/24 0945     Visit Number 15    Number of Visits 22    Date for PT Re-Evaluation 04/04/24    PT Start Time 0939    PT Stop Time 1017    PT Time Calculation (min) 38 min    Activity Tolerance Patient tolerated treatment well;No increased pain    Behavior During Therapy WFL for tasks assessed/performed                   Past Medical History:  Diagnosis Date   Arthritis    R shoulder, great toes, hands- has gout & has injections in knees with Dr. Corliss Skains    Bicuspid aortic valve with ascending aorta 4.0 to 4.5 cm in diameter    CAD (coronary artery disease)    Cancer (HCC)    skin ca-    GERD (gastroesophageal reflux disease)    Headache    History of hiatal hernia    Hypertension    Prostate cancer (HCC)    S/P TAVR (transcatheter aortic valve replacement)    SDH (subdural hematoma) (HCC)    Severe aortic stenosis    Thoracic ascending aortic aneurysm (HCC)    Vertebral artery aneurysm [facility])    Past Surgical History:  Procedure Laterality Date   BRAIN SURGERY  2016   evacuation of SDH   CARDIAC CATHETERIZATION     CARPAL TUNNEL RELEASE Bilateral    embolization of left vertebral artery aneurysm with pipeline/coil  10/31/2022   GREEN LIGHT LASER TURP (TRANSURETHRAL RESECTION OF PROSTATE  04/27/2017   KNEE ARTHROPLASTY     LEFT HEART CATH AND CORONARY ANGIOGRAPHY N/A 10/08/2019   Procedure: LEFT HEART CATH AND CORONARY ANGIOGRAPHY;  Surgeon: Tonny Bollman, MD;  Location: [facility] INVASIVE CV LAB;  Service: Cardiovascular;  Laterality: N/A;   QUADRICEPS TENDON REPAIR Bilateral 06/15/2017   Procedure: REPAIR QUADRICEP TENDON;  Surgeon: Cammy Copa, MD;  Location: [facility] OR;  Service: Orthopedics;  Laterality: Bilateral;   SHOULDER SURGERY Right    TEE WITHOUT CARDIOVERSION  N/A 07/03/2022   Procedure: TRANSESOPHAGEAL ECHOCARDIOGRAM (TEE);  Surgeon: Wendall Stade, MD;  Location: [facility] ENDOSCOPY;  Service: Cardiovascular;  Laterality: N/A;   TRANSCATHETER AORTIC VALVE REPLACEMENT, TRANSFEMORAL  12/23/2019   TRANSCATHETER AORTIC VALVE REPLACEMENT, TRANSFEMORAL N/A 12/23/2019   Procedure: TRANSCATHETER AORTIC VALVE REPLACEMENT, TRANSFEMORAL;  Surgeon: Tonny Bollman, MD;  Location: [facility] OR;  Service: Open Heart Surgery;  Laterality: N/A;   VASCULAR SURGERY     Patient Active Problem List   Diagnosis Date Noted   Hypertension    Severe aortic stenosis 10/08/2019   Candidiasis    Abnormality of gait    Hypoalbuminemia due to protein-calorie malnutrition [facility])    History of gout    Sleep disturbance    Constipation    Rupture of quadriceps tendon, right, sequela 06/19/2017   Quadriceps tendon rupture, left, sequela 06/19/2017   Acute blood loss anemia 06/19/2017   Fall    History of subdural hematoma    Post-operative pain    Recurrent falls    Quadriceps tendon rupture 06/15/2017   Primary osteoarthritis of both knees 02/28/2017   Primary osteoarthritis of both hands 02/28/2017   Uricacidemia 02/28/2017   Pain in joint of left knee 02/07/2017   Idiopathic chronic gout, unspecified site, without tophus (tophi)  11/29/2016   HEMATURIA UNSPECIFIED 01/25/2009   Abdominal pain 01/25/2009   XERODERMA 03/10/2008   UNS ADVRS EFF UNS RX MEDICINAL&BIOLOGICAL SBSTNC 03/10/2008   ADJUSTMENT REACTION WITH PHYSICAL SYMPTOMS 02/14/2008   MUSCLE SPASM 02/14/2008   ACUTE PROSTATITIS 12/16/2007   BREAST LUMP OR MASS, RIGHT 07/12/2007   H/O: RCT (rotator cuff tear) 01/13/2007   GOUT 01/08/2007    PCP: Earleen Reaper, MD   REFERRING PROVIDER: Cammy Copa, MD   REFERRING DIAG:  [phone] (ICD-10-CM) - Synovitis of left knee  M19.011 (ICD-10-CM) - Primary osteoarthritis, right shoulder  M54.50 (ICD-10-CM) - Low back pain, unspecified back pain laterality,  unspecified chronicity, unspecified whether sciatica present    THERAPY DIAG:  Chronic right shoulder pain  Other low back pain  Chronic pain of left knee  Muscle weakness (generalized)  Rationale for Evaluation and Treatment: Rehabilitation  ONSET DATE: see subjective, on-going for years    PERTINENT HISTORY: HTN, severe aortic stenosis, abnormality of gait, h/o gout, rupture of quadriceps tendon on Rt 2018 on left 2018, subdural hematoma 2016, h/o falls, primary OA in bil hands Polio on Left side affecting Left UE strength  SUBJECTIVE:    SUBJECTIVE STATEMENT: Pt stating pain this morning was 7-8/10. Now pain is 3-4/10. Pt stating his shoulder is feeling a little better. Pt reporting issues with 4th digit trigger fingers.    EVAL:  Pt stating he has been painting a lot at home and feels it may have exacerbated his Rt shoulder. Pt reporting radiation pain down his arm but no numbness/tingling. Pt stating Rt shoulder is giving him the most problem. Pt stating after getting the second shingles shot his shoulder pain increased into tricep and shoulder. Pt stating pain has been ongoing for about a year. Pt stating pain is worse at night.  Pt stating left knee pain has been ongoing since January 2018 when he was skiing and twisted his knee and he tore his quadriceps tendon. Pt stating he fell and when he fell he tore both tendons in bil LE. Pt then stating the repair didn't take on his left LE and it was repaired again. Pt stating he had PT and he gained ROM back in bil LE.  Pt reporting falling while in hospital during a bed transfer where he landed on his floor on his back in 2018. Pt stating his back has bothered him on/off ever since.  Pt stating he hasn't been able to work out over the past few months due to recent move into [place].   PAIN:  NPRS scale:see above subjective Pain description: achy, throbbing Aggravating factors: resting, tylenol, voltaren Relieving  factors: Reaching, overhead function, spine flexion and slouched postures  PRECAUTIONS: None  WEIGHT BEARING RESTRICTIONS: No  FALLS:  Has patient fallen in last 6 months? No falls in last few years. Haven't fell since he tore his quad tendons and during a hospital transfer several years ago  LIVING ENVIRONMENT: Lives with: lives with their family and lives with an adult companion Lives in: House/apartment Stairs: Yes: Internal: 17 steps; on left going up and External: 2 steps; on left going up Has following equipment at home: None  OCCUPATION: retired  PLOF: Independent  PATIENT GOALS: Be able to work out at gym with less pain  Next MD visit:   OBJECTIVE:   DIAGNOSTIC FINDINGS: Shoulder: 08/06/23  3 views of the right shoulder including Grashey, scapular Y and axial view  were ordered and reviewed by myself.  X-rays show severe osteoarthritis  of  the right glenohumeral joint.  There is a large inferior spur off the  humeral head.  There is some chondrocalcinosis superior to the humeral  head, likely indicative of pseudogout or prior gout.  No acute bony  fracture noted.  There is bony sclerosis noted of the humeral head and  inferior acetabulum from likely arthritic change.   Knee 05/22/23:   1. Postsurgical changes of quadriceps tendon repair. The tendon appears intact. Resolution of the prior small fluid bright signal within the midsubstance of the quadriceps tendon insertion on prior 10/01/2018 MRI. 2. Stable chronic oblique tear of the anterior horn of the lateral meniscus. Mild degenerative undersurface fraying of the posterior horn of the lateral meniscus. 3. Interval worsening of moderate to severe patellofemoral cartilage degenerative changes. 4. Mildly worsened cartilage loss within the anteromedial aspect of the weight-bearing lateral femoral condyle and adjacent lateral tibial plateau. 5. Unchanged high-grade partial to full-thickness cartilage loss within  the weight-bearing medial femoral condyle.  Lumbar: 05/22/23:  AP lateral radiographs lumbar spine reviewed.  Grade 1-2 spondylolisthesis present at L5-S1 with pars fracture noted at the corresponding level.   Mild to moderate degenerative changes present in the upper and and middle lumbar spine region.  Visualized hips without arthritis    PATIENT SURVEYS:  12/05/23: FOTO intake:  49%  predicted:  51% 12/31/23: FOTO update 48%  02/27/24: FOTO update 57%  COGNITION: Overall cognitive status: WFL    SENSATION: WFL   POSTURE:  rounded shoulders, forward head, and decreased lumbar lordosis  PALPATION: TTP: distal left quad tendon, Rt upper trap and levator and anterior shoulder  MMT   Right 12/04/23 Left 12/04/23 Rt  / Left 12/18/23 HHD ppsi Right 02/29/2024  Hip flexion 5 5    Hip extension 5 5    Hip abduction 5 5    Hip adduction 5 5    Hip internal rotation      Hip external rotation      Knee flexion 5 5 52.5 / 40.0   Knee extension 5 5 54.4 / 65.5                    SHOULDER       Shoulder flexion 4 3    Shoulder abduction 4 3    Shoulder ER 4- 3  23.0 pounds  Shoulder IR 4- 3  33.4 pounds   (Blank rows = not tested)    ROM  LE Right 12/04/23 Left 12/04/23 Rt  01/22/24 Left 01/22/24 Active supine 02/18/24 Rt  Active standing 02/29/2024 Right  Hip flexion 108 110 112 112 140    Hip extension        Hip abduction        Hip adduction        Hip internal rotation        Hip external rotation        Knee flexion 134 126      Knee extension 0 0       SHOULDER:          Shoulder flexion 138 158 145 155 155 150  Shoulder abduction 125 140   130   Shoulder ER 30 60   52 50  Shoulder IR      20  Shoulder horizontal adduction      35   (Blank rows = not tested)    FUNCTIONAL TESTS:   12/04/23 : 5 time sit to stand: 17.2 seconds no UE support  01/22/24: 5 time sit to stand: 9.2 seconds no UE support 02/18/24:  10.1 seconds no UE  support  GAIT: Distance walked: clinic distances, level surface Assistive device utilized: None Level of assistance: Complete Independence Comments: mild forward flexed trunk posture                                                                                                                                                                       TODAY'S TREATMENT                                                                           DATE:  03/10/24:  TherEx:  Scifit bike: 10 minutes level 4 UE/LE Supine SKTC x 3 holding 20 sec Supine IR/ER  shoulder abd 90 deg, shoulder stretch x 3 holding 10-15  There Activities:  Leg Press: 112# bil LE's  3x 10, single leg 56# 2 x 10 bil (pt reporting previous weight was too much today due to increased pain in his ow back) Stepping over 9 inch hurdles placed front and to the side x 15 c each LE Step ups x 15 on 6 inches  Self Care:  Pt was instructed in how to wrap band-aids around his proximal PCP joint to help with his trigger fingers. Band -aids applied to bil 4th digits.     03/05/24:  TherEx:  Recumbent bike: 10 minutes level 10 Supine SKTC x 3 holding 20 sec Supine IR/ER  shoulder abd 90 deg, shoulder stretch x 3 holding 10-15  There Activities:  Leg Press: 112# bil LE's  3x 10, single leg 56# 2 x 10 bil (pt reporting previous weight was too much today due to increased pain in his ow back) Lifting 12# from 6 inch step  x 5 with corrected body mechanics Diagonal pulls (down to up across the body) x 10 green TB bil  Wood chops green TB x 10 bil     03/03/24:  TherEx:  Nustep: level 6 x 13 minutes Gastroc stretch x 2 holding 20 sec Soleus stretch: x 2 holding 20 sec Supine IR stretch at 90 deg abd  x 15 holding 3 sec Supine ER at 90 deg abd x 15 holding 3 sec Side-lying sleeper stretch for anterior capsule x 10 holding 3 sec Supine ER at 45 deg using 1# bar x 10  Wall push up x 5  Rows: blue TB x 15 holding 5 sec scapular  retraction There  Activities:  Leg Press: 118# bil LE's  2 x 15, single leg 62# x 15 bil      02/29/2024 Recumbent bike Seat 9 for 5 minutes Supine shoulder IR/ER stretch 20 x 10 seconds Scapular retraction/shoulder blade pinches 10 x 5 seconds  Functional Activities: Brief objective shoulder reassessment; reviewed spine anatomy, postural basics, the importance of avoiding prolonged postures (particularly sitting) and flexed postures   02/27/24 TherEx:  Recumbent bike: level 4 x 6 minutes Standing Row: level 4, 2 x 15 holding 3 Standing shoulder extension: level 4, x 15 holding 3 sec Supine trunk rotation: x 3 holding 30 sec Supine shoulder flexion c 2# bar 2 x 10 Supine Rt shoulder ER x 15 AAROM c 2# bar Supine bil shoulder protraction x 15 c 2# bar TherActivites:  Leg Press: 100# 2 x 15 , single LE 43# 2 x 10 Sit to stand: x 10         PATIENT EDUCATION:  Education details: HEP, POC Person educated: Patient Education method: Programmer, multimedia, Demonstration, Verbal cues, and Handouts Education comprehension: verbalized understanding, returned demonstration, and verbal cues required  HOME EXERCISE PROGRAM: Access Code: [email or web address] URL: [email or web address] Date: 02/29/2024 Prepared by: Pauletta Browns  Exercises - Standing Shoulder Row with Anchored Resistance  - 1 x daily - 7 x weekly - 3 sets - 10 reps - Supine Bridge  - 2 x daily - 7 x weekly - 2 sets - 10 reps - 5 seconds hold - Supine Shoulder External Rotation in 45 Degrees Abduction AAROM with Dowel  - 1 x daily - 7 x weekly - 3 sets - 10 reps - Supine Shoulder Flexion Extension AAROM with Dowel  - 2-3 x daily - 7 x weekly - 2 sets - 10 reps - Seated Small Alternating Straight Leg Lifts with Heel Touch  - 2 x daily - 7 x weekly - 15-20 reps - Recumbent Bike  - 2-3 x daily - 7 x weekly - Bridge with Hip Abduction and Resistance  - 1-2 x daily - 7 x weekly - 2 sets - 10 reps - Supine Shoulder External  Rotation Stretch  - 2 x daily - 7 x weekly - 1 sets - 10-20 reps - 10 seconds hold - Supine Shoulder Internal Rotation  - 2 x daily - 7 x weekly - 1 sets - 10-20 reps - 10 seconds hold - Standing Scapular Retraction  - 5 x daily - 7 x weekly - 1 sets - 5 reps - 5 second hold  ASSESSMENT:  CLINICAL IMPRESSION: Pt was instructed in application in how to help manage his bil trigger fingers with advice from OT. Pt tolerating all exercises and functional mobility well. Continue skilled PT to maximize pts function.   OBJECTIVE IMPAIRMENTS: difficulty walking, decreased ROM, decreased strength, increased edema, impaired flexibility, impaired UE functional use, postural dysfunction, and pain.   ACTIVITY LIMITATIONS: lifting, bending, standing, squatting, sleeping, stairs, transfers, reach over head, and hygiene/grooming  PARTICIPATION LIMITATIONS: cleaning, community activity, and yard work  PERSONAL FACTORS: 3+ comorbidities: see pertinent history  are also affecting patient's functional outcome.   REHAB POTENTIAL: Good  CLINICAL DECISION MAKING: Evolving/moderate complexity  EVALUATION COMPLEXITY: High   GOALS: Goals reviewed with patient? Yes  SHORT TERM GOALS: (target date for Short term goals are 3 weeks 12/25/2023)   1.  Patient will demonstrate independent use of home exercise program to maintain progress from in clinic treatments.  Goal status:MET 02/18/24  2. Pt to improve his 5  time sit to stand to </=  13  seconds with UE support.   Goal Status: MET 02/18/24   LONG TERM GOALS: (target dates for all long term goals are 6 weeks 04/04/24)   1. Patient will demonstrate/report pain at worst less than or equal to 2/10 to facilitate minimal limitation in daily activity secondary to pain symptoms.  Goal status: On Going 02/29/2024   2. Patient will demonstrate independent use of home exercise program to facilitate ability to maintain/progress functional gains from skilled physical  therapy services.  Goal status: New   3. Patient will demonstrate FOTO outcome > or = 51 % to indicate reduced disability due to condition.  49.5% FOTO 02/18/24 Goal status: New   4.  Patient will be able to perform up and down 1 flight of stairs with reciprocal gait pattern with left knee pain and low back pain </= 2/10.   Goal status: New   5.  Patient will demonstrate improved Rt UE flexion to >/= 150 degrees in order to improve functional mobility.   Goal status: Met 02/29/2024   6.  Pt will improve Rt shoulder ER to >/= 60 deg in order to improve functional mobility.  Goal status: On Going 02/29/2024      PLAN:  PT FREQUENCY: 1-2x/week  PT DURATION: 6 weeks  PLANNED INTERVENTIONS: Can include 46962- PT Re-evaluation, 97110-Therapeutic exercises, 97530- Therapeutic activity, 97112- Neuromuscular re-education, 97535- Self Care, 97140- Manual therapy, [phone]- Gait training, [phone]- Orthotic Fit/training, [phone]- Canalith repositioning, [id number]- Aquatic Therapy, 97014- Electrical stimulation (unattended), [id number]- Electrical stimulation (manual), [id number]- Vasopneumatic device, [id number]- Ultrasound, [id number]- Traction (mechanical), [id number]- Ionotophoresis 4mg /ml Dexamethasone, Patient/Family education, Balance training, Stair training, Taping, Dry Needling, Joint mobilization, Joint manipulation, Spinal manipulation, Spinal mobilization, Scar mobilization, Vestibular training, Visual/preceptual remediation/compensation, DME instructions, Cryotherapy, and Moist heat.  All performed as medically necessary.  All included unless contraindicated  PLAN FOR NEXT SESSION:  Shoulder and back exercises, capsular stretching, body mechanics, low back and postural strengthening, try percussion or STM as needed  Re-certification: 02/18/24 for 6 more weeks   Sharmon Leyden, PT, MPT 03/10/2024, 12:47 PM

## 2024-03-12 ENCOUNTER — Encounter: Admitting: Physical Therapy

## 2024-03-13 ENCOUNTER — Encounter: Payer: Self-pay | Admitting: Physical Therapy

## 2024-03-13 ENCOUNTER — Ambulatory Visit (INDEPENDENT_AMBULATORY_CARE_PROVIDER_SITE_OTHER): Admitting: Physical Therapy

## 2024-03-13 DIAGNOSIS — M25562 Pain in left knee: Secondary | ICD-10-CM

## 2024-03-13 DIAGNOSIS — M25511 Pain in right shoulder: Secondary | ICD-10-CM | POA: Diagnosis not present

## 2024-03-13 DIAGNOSIS — M5459 Other low back pain: Secondary | ICD-10-CM

## 2024-03-13 DIAGNOSIS — M6281 Muscle weakness (generalized): Secondary | ICD-10-CM | POA: Diagnosis not present

## 2024-03-13 DIAGNOSIS — G8929 Other chronic pain: Secondary | ICD-10-CM

## 2024-03-13 NOTE — Therapy (Signed)
 OUTPATIENT PHYSICAL THERAPY TREATMENT    Patient Name: Jose [name] MRN: [id number] DOB:Dec 21, 1950, 73 y.o., male Today's Date: 03/13/2024  END OF SESSION:  PT End of Session - 03/13/24 1302     Visit Number 16    Number of Visits 22    Date for PT Re-Evaluation 04/04/24    PT Start Time 1300    PT Stop Time 1340    PT Time Calculation (min) 40 min    Activity Tolerance Patient tolerated treatment well;No increased pain    Behavior During Therapy WFL for tasks assessed/performed                   Past Medical History:  Diagnosis Date   Arthritis    R shoulder, great toes, hands- has gout & has injections in knees with Dr. Corliss Skains    Bicuspid aortic valve with ascending aorta 4.0 to 4.5 cm in diameter    CAD (coronary artery disease)    Cancer (HCC)    skin ca-    GERD (gastroesophageal reflux disease)    Headache    History of hiatal hernia    Hypertension    Prostate cancer (HCC)    S/P TAVR (transcatheter aortic valve replacement)    SDH (subdural hematoma) (HCC)    Severe aortic stenosis    Thoracic ascending aortic aneurysm (HCC)    Vertebral artery aneurysm [facility])    Past Surgical History:  Procedure Laterality Date   BRAIN SURGERY  2016   evacuation of SDH   CARDIAC CATHETERIZATION     CARPAL TUNNEL RELEASE Bilateral    embolization of left vertebral artery aneurysm with pipeline/coil  10/31/2022   GREEN LIGHT LASER TURP (TRANSURETHRAL RESECTION OF PROSTATE  04/27/2017   KNEE ARTHROPLASTY     LEFT HEART CATH AND CORONARY ANGIOGRAPHY N/A 10/08/2019   Procedure: LEFT HEART CATH AND CORONARY ANGIOGRAPHY;  Surgeon: Tonny Bollman, MD;  Location[facility] INVASIVE CV LAB;  Service: Cardiovascular;  Laterality: N/A;   QUADRICEPS TENDON REPAIR Bilateral 06/15/2017   Procedure: REPAIR QUADRICEP TENDON;  Surgeon: Cammy Copa, MD;  Location: [facility] OR;  Service: Orthopedics;  Laterality: Bilateral;   SHOULDER SURGERY Right    TEE WITHOUT CARDIOVERSION  N/A 07/03/2022   Procedure: TRANSESOPHAGEAL ECHOCARDIOGRAM (TEE);  Surgeon: Wendall Stade, MD;  Location: [facility] ENDOSCOPY;  Service: Cardiovascular;  Laterality: N/A;   TRANSCATHETER AORTIC VALVE REPLACEMENT, TRANSFEMORAL  12/23/2019   TRANSCATHETER AORTIC VALVE REPLACEMENT, TRANSFEMORAL N/A 12/23/2019   Procedure: TRANSCATHETER AORTIC VALVE REPLACEMENT, TRANSFEMORAL;  Surgeon: Tonny Bollman, MD;  Location: [facility] OR;  Service: Open Heart Surgery;  Laterality: N/A;   VASCULAR SURGERY     Patient Active Problem List   Diagnosis Date Noted   Hypertension    Severe aortic stenosis 10/08/2019   Candidiasis    Abnormality of gait    Hypoalbuminemia due to protein-calorie malnutrition [facility])    History of gout    Sleep disturbance    Constipation    Rupture of quadriceps tendon, right, sequela 06/19/2017   Quadriceps tendon rupture, left, sequela 06/19/2017   Acute blood loss anemia 06/19/2017   Fall    History of subdural hematoma    Post-operative pain    Recurrent falls    Quadriceps tendon rupture 06/15/2017   Primary osteoarthritis of both knees 02/28/2017   Primary osteoarthritis of both hands 02/28/2017   Uricacidemia 02/28/2017   Pain in joint of left knee 02/07/2017   Idiopathic chronic gout, unspecified site, without tophus (tophi)  11/29/2016   HEMATURIA UNSPECIFIED 01/25/2009   Abdominal pain 01/25/2009   XERODERMA 03/10/2008   UNS ADVRS EFF UNS RX MEDICINAL&BIOLOGICAL SBSTNC 03/10/2008   ADJUSTMENT REACTION WITH PHYSICAL SYMPTOMS 02/14/2008   MUSCLE SPASM 02/14/2008   ACUTE PROSTATITIS 12/16/2007   BREAST LUMP OR MASS, RIGHT 07/12/2007   H/O: RCT (rotator cuff tear) 01/13/2007   GOUT 01/08/2007    PCP: Earleen Reaper, MD   REFERRING PROVIDER: Cammy Copa, MD   REFERRING DIAG:  [phone] (ICD-10-CM) - Synovitis of left knee  M19.011 (ICD-10-CM) - Primary osteoarthritis, right shoulder  M54.50 (ICD-10-CM) - Low back pain, unspecified back pain laterality,  unspecified chronicity, unspecified whether sciatica present    THERAPY DIAG:  Chronic right shoulder pain  Other low back pain  Chronic pain of left knee  Muscle weakness (generalized)  Rationale for Evaluation and Treatment: Rehabilitation  ONSET DATE: see subjective, on-going for years    PERTINENT HISTORY: HTN, severe aortic stenosis, abnormality of gait, h/o gout, rupture of quadriceps tendon on Rt 2018 on left 2018, subdural hematoma 2016, h/o falls, primary OA in bil hands Polio on Left side affecting Left UE strength  SUBJECTIVE:    SUBJECTIVE STATEMENT: Relays the pain is a little more than usual due to stress of doing taxes and lots of sitting.    EVAL:  Pt stating he has been painting a lot at home and feels it may have exacerbated his Rt shoulder. Pt reporting radiation pain down his arm but no numbness/tingling. Pt stating Rt shoulder is giving him the most problem. Pt stating after getting the second shingles shot his shoulder pain increased into tricep and shoulder. Pt stating pain has been ongoing for about a year. Pt stating pain is worse at night.  Pt stating left knee pain has been ongoing since January 2018 when he was skiing and twisted his knee and he tore his quadriceps tendon. Pt stating he fell and when he fell he tore both tendons in bil LE. Pt then stating the repair didn't take on his left LE and it was repaired again. Pt stating he had PT and he gained ROM back in bil LE.  Pt reporting falling while in hospital during a bed transfer where he landed on his floor on his back in 2018. Pt stating his back has bothered him on/off ever since.  Pt stating he hasn't been able to work out over the past few months due to recent move into [place].   PAIN:  NPRS scale:see above subjective Pain description: achy, throbbing Aggravating factors: resting, tylenol, voltaren Relieving factors: Reaching, overhead function, spine flexion and slouched  postures  PRECAUTIONS: None  WEIGHT BEARING RESTRICTIONS: No  FALLS:  Has patient fallen in last 6 months? No falls in last few years. Haven't fell since he tore his quad tendons and during a hospital transfer several years ago  LIVING ENVIRONMENT: Lives with: lives with their family and lives with an adult companion Lives in: House/apartment Stairs: Yes: Internal: 17 steps; on left going up and External: 2 steps; on left going up Has following equipment at home: None  OCCUPATION: retired  PLOF: Independent  PATIENT GOALS: Be able to work out at gym with less pain  Next MD visit:   OBJECTIVE:   DIAGNOSTIC FINDINGS: Shoulder: 08/06/23  3 views of the right shoulder including Grashey, scapular Y and axial view  were ordered and reviewed by myself.  X-rays show severe osteoarthritis of  the right glenohumeral joint.  There is  a large inferior spur off the  humeral head.  There is some chondrocalcinosis superior to the humeral  head, likely indicative of pseudogout or prior gout.  No acute bony  fracture noted.  There is bony sclerosis noted of the humeral head and  inferior acetabulum from likely arthritic change.   Knee 05/22/23:   1. Postsurgical changes of quadriceps tendon repair. The tendon appears intact. Resolution of the prior small fluid bright signal within the midsubstance of the quadriceps tendon insertion on prior 10/01/2018 MRI. 2. Stable chronic oblique tear of the anterior horn of the lateral meniscus. Mild degenerative undersurface fraying of the posterior horn of the lateral meniscus. 3. Interval worsening of moderate to severe patellofemoral cartilage degenerative changes. 4. Mildly worsened cartilage loss within the anteromedial aspect of the weight-bearing lateral femoral condyle and adjacent lateral tibial plateau. 5. Unchanged high-grade partial to full-thickness cartilage loss within the weight-bearing medial femoral condyle.  Lumbar: 05/22/23:  AP  lateral radiographs lumbar spine reviewed.  Grade 1-2 spondylolisthesis present at L5-S1 with pars fracture noted at the corresponding level.   Mild to moderate degenerative changes present in the upper and and middle lumbar spine region.  Visualized hips without arthritis    PATIENT SURVEYS:  12/05/23: FOTO intake:  49%  predicted:  51% 12/31/23: FOTO update 48%  02/27/24: FOTO update 57%  COGNITION: Overall cognitive status: WFL    SENSATION: WFL   POSTURE:  rounded shoulders, forward head, and decreased lumbar lordosis  PALPATION: TTP: distal left quad tendon, Rt upper trap and levator and anterior shoulder  MMT   Right 12/04/23 Left 12/04/23 Rt  / Left 12/18/23 HHD ppsi Right 02/29/2024  Hip flexion 5 5    Hip extension 5 5    Hip abduction 5 5    Hip adduction 5 5    Hip internal rotation      Hip external rotation      Knee flexion 5 5 52.5 / 40.0   Knee extension 5 5 54.4 / 65.5                    SHOULDER       Shoulder flexion 4 3    Shoulder abduction 4 3    Shoulder ER 4- 3  23.0 pounds  Shoulder IR 4- 3  33.4 pounds   (Blank rows = not tested)    ROM  LE Right 12/04/23 Left 12/04/23 Rt  01/22/24 Left 01/22/24 Active supine 02/18/24 Rt  Active standing 02/29/2024 Right  Hip flexion 108 110 112 112 140    Hip extension        Hip abduction        Hip adduction        Hip internal rotation        Hip external rotation        Knee flexion 134 126      Knee extension 0 0       SHOULDER:          Shoulder flexion 138 158 145 155 155 150  Shoulder abduction 125 140   130   Shoulder ER 30 60   52 50  Shoulder IR      20  Shoulder horizontal adduction      35   (Blank rows = not tested)    FUNCTIONAL TESTS:   12/04/23 : 5 time sit to stand: 17.2 seconds no UE support 01/22/24: 5 time sit to stand: 9.2 seconds no  UE support 02/18/24:  10.1 seconds no UE support  GAIT: Distance walked: clinic distances, level surface Assistive device  utilized: None Level of assistance: Complete Independence Comments: mild forward flexed trunk posture                                                                                                                                                                       TODAY'S TREATMENT                                                                           DATE:   03/13/24:   Nu step L5 X 12 min UE/LE Supine SKTC x 3 holding 20 sec Supine IR/ER  shoulder abd 90 deg, shoulder stretch x 3 holding 10-15  There Activities:  Leg Press: 112# bil LE's  3x 10, single leg 56# 2 x 10 bil Stepping over 9 inch hurdles placed front, started reciprocal pattern with UE support, then worked on on UE support with step over pattern one leg at at time, then progressed to reciprocal step over without UE support Step ups x 15 on 6 inches Rows blue X 20 Shoulder extensions blue X 20 Diagonal chops blue X 20 bilat  03/10/24:  TherEx:  Scifit bike: 10 minutes level 4 UE/LE Supine SKTC x 3 holding 20 sec Supine IR/ER  shoulder abd 90 deg, shoulder stretch x 3 holding 10-15  There Activities:  Leg Press: 112# bil LE's  3x 10, single leg 56# 2 x 10 bil (pt reporting previous weight was too much today due to increased pain in his ow back) Stepping over 9 inch hurdles placed front and to the side x 15 c each LE Step ups x 15 on 6 inches  Self Care:  Pt was instructed in how to wrap band-aids around his proximal PCP joint to help with his trigger fingers. Band -aids applied to bil 4th digits.     03/05/24:  TherEx:  Recumbent bike: 10 minutes level 10 Supine SKTC x 3 holding 20 sec Supine IR/ER  shoulder abd 90 deg, shoulder stretch x 3 holding 10-15  There Activities:  Leg Press: 112# bil LE's  3x 10, single leg 56# 2 x 10 bil (pt reporting previous weight was too much today due to increased pain in his ow back) Lifting 12# from 6 inch step  x 5 with corrected body mechanics Diagonal pulls (down to up  across the body) x 10 green  TB bil  Wood chops green TB x 10 bil     03/03/24:  TherEx:  Nustep: level 6 x 13 minutes Gastroc stretch x 2 holding 20 sec Soleus stretch: x 2 holding 20 sec Supine IR stretch at 90 deg abd  x 15 holding 3 sec Supine ER at 90 deg abd x 15 holding 3 sec Side-lying sleeper stretch for anterior capsule x 10 holding 3 sec Supine ER at 45 deg using 1# bar x 10  Wall push up x 5  Rows: blue TB x 15 holding 5 sec scapular retraction There Activities:  Leg Press: 118# bil LE's  2 x 15, single leg 62# x 15 bil       PATIENT EDUCATION:  Education details: HEP, POC Person educated: Patient Education method: Programmer, multimedia, Demonstration, Verbal cues, and Handouts Education comprehension: verbalized understanding, returned demonstration, and verbal cues required  HOME EXERCISE PROGRAM: Access Code: [email or web address] URL: [email or web address] Date: 02/29/2024 Prepared by: Pauletta Browns  Exercises - Standing Shoulder Row with Anchored Resistance  - 1 x daily - 7 x weekly - 3 sets - 10 reps - Supine Bridge  - 2 x daily - 7 x weekly - 2 sets - 10 reps - 5 seconds hold - Supine Shoulder External Rotation in 45 Degrees Abduction AAROM with Dowel  - 1 x daily - 7 x weekly - 3 sets - 10 reps - Supine Shoulder Flexion Extension AAROM with Dowel  - 2-3 x daily - 7 x weekly - 2 sets - 10 reps - Seated Small Alternating Straight Leg Lifts with Heel Touch  - 2 x daily - 7 x weekly - 15-20 reps - Recumbent Bike  - 2-3 x daily - 7 x weekly - Bridge with Hip Abduction and Resistance  - 1-2 x daily - 7 x weekly - 2 sets - 10 reps - Supine Shoulder External Rotation Stretch  - 2 x daily - 7 x weekly - 1 sets - 10-20 reps - 10 seconds hold - Supine Shoulder Internal Rotation  - 2 x daily - 7 x weekly - 1 sets - 10-20 reps - 10 seconds hold - Standing Scapular Retraction  - 5 x daily - 7 x weekly - 1 sets - 5 reps - 5 second hold  ASSESSMENT:  CLINICAL IMPRESSION: He  arrives in more overall pain but despite this seemed to tolerate exercises well He did ask about where we got the handles for the therabands so I did provide him with this info.   OBJECTIVE IMPAIRMENTS: difficulty walking, decreased ROM, decreased strength, increased edema, impaired flexibility, impaired UE functional use, postural dysfunction, and pain.   ACTIVITY LIMITATIONS: lifting, bending, standing, squatting, sleeping, stairs, transfers, reach over head, and hygiene/grooming  PARTICIPATION LIMITATIONS: cleaning, community activity, and yard work  PERSONAL FACTORS: 3+ comorbidities: see pertinent history  are also affecting patient's functional outcome.   REHAB POTENTIAL: Good  CLINICAL DECISION MAKING: Evolving/moderate complexity  EVALUATION COMPLEXITY: High   GOALS: Goals reviewed with patient? Yes  SHORT TERM GOALS: (target date for Short term goals are 3 weeks 12/25/2023)   1.  Patient will demonstrate independent use of home exercise program to maintain progress from in clinic treatments.  Goal status:MET 02/18/24  2. Pt to improve his 5 time sit to stand to </=  13  seconds with UE support.   Goal Status: MET 02/18/24   LONG TERM GOALS: (target dates for all long term goals are 6 weeks 04/04/24)   1. Patient  will demonstrate/report pain at worst less than or equal to 2/10 to facilitate minimal limitation in daily activity secondary to pain symptoms.  Goal status: On Going 02/29/2024   2. Patient will demonstrate independent use of home exercise program to facilitate ability to maintain/progress functional gains from skilled physical therapy services.  Goal status: New   3. Patient will demonstrate FOTO outcome > or = 51 % to indicate reduced disability due to condition.  49.5% FOTO 02/18/24 Goal status: New   4.  Patient will be able to perform up and down 1 flight of stairs with reciprocal gait pattern with left knee pain and low back pain </= 2/10.   Goal status:  New   5.  Patient will demonstrate improved Rt UE flexion to >/= 150 degrees in order to improve functional mobility.   Goal status: Met 02/29/2024   6.  Pt will improve Rt shoulder ER to >/= 60 deg in order to improve functional mobility.  Goal status: On Going 02/29/2024      PLAN:  PT FREQUENCY: 1-2x/week  PT DURATION: 6 weeks  PLANNED INTERVENTIONS: Can include 16109- PT Re-evaluation, 97110-Therapeutic exercises, 97530- Therapeutic activity, 97112- Neuromuscular re-education, 97535- Self Care, 97140- Manual therapy, [phone]- Gait training, [phone]- Orthotic Fit/training, [phone]- Canalith repositioning, [id number]- Aquatic Therapy, 97014- Electrical stimulation (unattended), [id number]- Electrical stimulation (manual), [id number]- Vasopneumatic device, [id number]- Ultrasound, [id number]- Traction (mechanical), [id number]- Ionotophoresis 4mg /ml Dexamethasone, Patient/Family education, Balance training, Stair training, Taping, Dry Needling, Joint mobilization, Joint manipulation, Spinal manipulation, Spinal mobilization, Scar mobilization, Vestibular training, Visual/preceptual remediation/compensation, DME instructions, Cryotherapy, and Moist heat.  All performed as medically necessary.  All included unless contraindicated  PLAN FOR NEXT SESSION:  Shoulder and back exercises, capsular stretching, body mechanics, low back and postural strengthening, try percussion or STM as needed  Re-certification: 02/18/24 for 6 more weeks   April Manson, PT, DPT 03/13/2024, 1:02 PM

## 2024-03-17 ENCOUNTER — Ambulatory Visit (INDEPENDENT_AMBULATORY_CARE_PROVIDER_SITE_OTHER): Admitting: Physical Therapy

## 2024-03-17 ENCOUNTER — Encounter: Payer: Self-pay | Admitting: Physical Therapy

## 2024-03-17 DIAGNOSIS — M25511 Pain in right shoulder: Secondary | ICD-10-CM

## 2024-03-17 DIAGNOSIS — M5459 Other low back pain: Secondary | ICD-10-CM | POA: Diagnosis not present

## 2024-03-17 DIAGNOSIS — M6281 Muscle weakness (generalized): Secondary | ICD-10-CM

## 2024-03-17 DIAGNOSIS — M25562 Pain in left knee: Secondary | ICD-10-CM

## 2024-03-17 DIAGNOSIS — G8929 Other chronic pain: Secondary | ICD-10-CM

## 2024-03-17 NOTE — Therapy (Signed)
 OUTPATIENT PHYSICAL THERAPY TREATMENT    Patient Name: Jose [name] MRN: [id number] DOB:1951-11-23, 73 y.o., male Today's Date: 03/17/2024  END OF SESSION:  PT End of Session - 03/17/24 1024     Visit Number 17    Number of Visits 22    Date for PT Re-Evaluation 04/04/24    PT Start Time 0934    PT Stop Time 1018    PT Time Calculation (min) 44 min    Activity Tolerance Patient tolerated treatment well;No increased pain    Behavior During Therapy WFL for tasks assessed/performed                    Past Medical History:  Diagnosis Date   Arthritis    R shoulder, great toes, hands- has gout & has injections in knees with Dr. Corliss Skains    Bicuspid aortic valve with ascending aorta 4.0 to 4.5 cm in diameter    CAD (coronary artery disease)    Cancer (HCC)    skin ca-    GERD (gastroesophageal reflux disease)    Headache    History of hiatal hernia    Hypertension    Prostate cancer (HCC)    S/P TAVR (transcatheter aortic valve replacement)    SDH (subdural hematoma) (HCC)    Severe aortic stenosis    Thoracic ascending aortic aneurysm (HCC)    Vertebral artery aneurysm [facility])    Past Surgical History:  Procedure Laterality Date   BRAIN SURGERY  2016   evacuation of SDH   CARDIAC CATHETERIZATION     CARPAL TUNNEL RELEASE Bilateral    embolization of left vertebral artery aneurysm with pipeline/coil  10/31/2022   GREEN LIGHT LASER TURP (TRANSURETHRAL RESECTION OF PROSTATE  04/27/2017   KNEE ARTHROPLASTY     LEFT HEART CATH AND CORONARY ANGIOGRAPHY N/A 10/08/2019   Procedure: LEFT HEART CATH AND CORONARY ANGIOGRAPHY;  Surgeon: Tonny Bollman, MD;  Location: [facility] INVASIVE CV LAB;  Service: Cardiovascular;  Laterality: N/A;   QUADRICEPS TENDON REPAIR Bilateral 06/15/2017   Procedure: REPAIR QUADRICEP TENDON;  Surgeon: Cammy Copa, MD;  Location: [facility] OR;  Service: Orthopedics;  Laterality: Bilateral;   SHOULDER SURGERY Right    TEE WITHOUT CARDIOVERSION  N/A 07/03/2022   Procedure: TRANSESOPHAGEAL ECHOCARDIOGRAM (TEE);  Surgeon: Wendall Stade, MD;  Location: [facility] ENDOSCOPY;  Service: Cardiovascular;  Laterality: N/A;   TRANSCATHETER AORTIC VALVE REPLACEMENT, TRANSFEMORAL  12/23/2019   TRANSCATHETER AORTIC VALVE REPLACEMENT, TRANSFEMORAL N/A 12/23/2019   Procedure: TRANSCATHETER AORTIC VALVE REPLACEMENT, TRANSFEMORAL;  Surgeon: Tonny Bollman, MD;  Location: [facility] OR;  Service: Open Heart Surgery;  Laterality: N/A;   VASCULAR SURGERY     Patient Active Problem List   Diagnosis Date Noted   Hypertension    Severe aortic stenosis 10/08/2019   Candidiasis    Abnormality of gait    Hypoalbuminemia due to protein-calorie malnutrition [facility])    History of gout    Sleep disturbance    Constipation    Rupture of quadriceps tendon, right, sequela 06/19/2017   Quadriceps tendon rupture, left, sequela 06/19/2017   Acute blood loss anemia 06/19/2017   Fall    History of subdural hematoma    Post-operative pain    Recurrent falls    Quadriceps tendon rupture 06/15/2017   Primary osteoarthritis of both knees 02/28/2017   Primary osteoarthritis of both hands 02/28/2017   Uricacidemia 02/28/2017   Pain in joint of left knee 02/07/2017   Idiopathic chronic gout, unspecified site, without tophus (  tophi) 11/29/2016   HEMATURIA UNSPECIFIED 01/25/2009   Abdominal pain 01/25/2009   XERODERMA 03/10/2008   UNS ADVRS EFF UNS RX MEDICINAL&BIOLOGICAL SBSTNC 03/10/2008   ADJUSTMENT REACTION WITH PHYSICAL SYMPTOMS 02/14/2008   MUSCLE SPASM 02/14/2008   ACUTE PROSTATITIS 12/16/2007   BREAST LUMP OR MASS, RIGHT 07/12/2007   H/O: RCT (rotator cuff tear) 01/13/2007   GOUT 01/08/2007    PCP: Earleen Reaper, MD   REFERRING PROVIDER: Cammy Copa, MD   REFERRING DIAG:  [phone] (ICD-10-CM) - Synovitis of left knee  M19.011 (ICD-10-CM) - Primary osteoarthritis, right shoulder  M54.50 (ICD-10-CM) - Low back pain, unspecified back pain laterality,  unspecified chronicity, unspecified whether sciatica present    THERAPY DIAG:  Chronic right shoulder pain  Other low back pain  Muscle weakness (generalized)  Chronic pain of left knee  Rationale for Evaluation and Treatment: Rehabilitation  ONSET DATE: see subjective, on-going for years    PERTINENT HISTORY: HTN, severe aortic stenosis, abnormality of gait, h/o gout, rupture of quadriceps tendon on Rt 2018 on left 2018, subdural hematoma 2016, h/o falls, primary OA in bil hands Polio on Left side affecting Left UE strength  SUBJECTIVE:    SUBJECTIVE STATEMENT: Pt reports not sleeping well last night due to pain of 8-9/10 after working in the yard.    EVAL:  Pt stating he has been painting a lot at home and feels it may have exacerbated his Rt shoulder. Pt reporting radiation pain down his arm but no numbness/tingling. Pt stating Rt shoulder is giving him the most problem. Pt stating after getting the second shingles shot his shoulder pain increased into tricep and shoulder. Pt stating pain has been ongoing for about a year. Pt stating pain is worse at night.  Pt stating left knee pain has been ongoing since January 2018 when he was skiing and twisted his knee and he tore his quadriceps tendon. Pt stating he fell and when he fell he tore both tendons in bil LE. Pt then stating the repair didn't take on his left LE and it was repaired again. Pt stating he had PT and he gained ROM back in bil LE.  Pt reporting falling while in hospital during a bed transfer where he landed on his floor on his back in 2018. Pt stating his back has bothered him on/off ever since.  Pt stating he hasn't been able to work out over the past few months due to recent move into [place].   PAIN:  NPRS scale:8-9/10 last night Pain description: achy, throbbing Aggravating factors: resting, tylenol, voltaren Relieving factors: Reaching, overhead function, spine flexion and slouched  postures  PRECAUTIONS: None  WEIGHT BEARING RESTRICTIONS: No  FALLS:  Has patient fallen in last 6 months? No falls in last few years. Haven't fell since he tore his quad tendons and during a hospital transfer several years ago  LIVING ENVIRONMENT: Lives with: lives with their family and lives with an adult companion Lives in: House/apartment Stairs: Yes: Internal: 17 steps; on left going up and External: 2 steps; on left going up Has following equipment at home: None  OCCUPATION: retired  PLOF: Independent  PATIENT GOALS: Be able to work out at gym with less pain  Next MD visit:   OBJECTIVE:   DIAGNOSTIC FINDINGS: Shoulder: 08/06/23  3 views of the right shoulder including Grashey, scapular Y and axial view  were ordered and reviewed by myself.  X-rays show severe osteoarthritis of  the right glenohumeral joint.  There is a  large inferior spur off the  humeral head.  There is some chondrocalcinosis superior to the humeral  head, likely indicative of pseudogout or prior gout.  No acute bony  fracture noted.  There is bony sclerosis noted of the humeral head and  inferior acetabulum from likely arthritic change.   Knee 05/22/23:   1. Postsurgical changes of quadriceps tendon repair. The tendon appears intact. Resolution of the prior small fluid bright signal within the midsubstance of the quadriceps tendon insertion on prior 10/01/2018 MRI. 2. Stable chronic oblique tear of the anterior horn of the lateral meniscus. Mild degenerative undersurface fraying of the posterior horn of the lateral meniscus. 3. Interval worsening of moderate to severe patellofemoral cartilage degenerative changes. 4. Mildly worsened cartilage loss within the anteromedial aspect of the weight-bearing lateral femoral condyle and adjacent lateral tibial plateau. 5. Unchanged high-grade partial to full-thickness cartilage loss within the weight-bearing medial femoral condyle.  Lumbar: 05/22/23:  AP  lateral radiographs lumbar spine reviewed.  Grade 1-2 spondylolisthesis present at L5-S1 with pars fracture noted at the corresponding level.   Mild to moderate degenerative changes present in the upper and and middle lumbar spine region.  Visualized hips without arthritis    PATIENT SURVEYS:  12/05/23: FOTO intake:  49%  predicted:  51% 12/31/23: FOTO update 48%  02/27/24: FOTO update 57%  COGNITION: Overall cognitive status: WFL    SENSATION: WFL   POSTURE:  rounded shoulders, forward head, and decreased lumbar lordosis  PALPATION: TTP: distal left quad tendon, Rt upper trap and levator and anterior shoulder  MMT   Right 12/04/23 Left 12/04/23 Rt  / Left 12/18/23 HHD ppsi Right 02/29/24 Rt 03/17/24  Hip flexion 5 5     Hip extension 5 5     Hip abduction 5 5     Hip adduction 5 5     Hip internal rotation       Hip external rotation       Knee flexion 5 5 52.5 / 40.0    Knee extension 5 5 54.4 / 65.5                     SHOULDER        Shoulder flexion 4 3     Shoulder abduction 4 3     Shoulder ER 4- 3  23.0 pounds 27.4 pounds  Shoulder IR 4- 3  33.4 pounds 39.4 pounds   (Blank rows = not tested)    ROM  LE Right 12/04/23 Left 12/04/23 Rt  01/22/24 Left 01/22/24 Active supine 02/18/24 Rt  Active standing 02/29/2024 Right  Hip flexion 108 110 112 112 140    Hip extension        Hip abduction        Hip adduction        Hip internal rotation        Hip external rotation        Knee flexion 134 126      Knee extension 0 0       SHOULDER:          Shoulder flexion 138 158 145 155 155 150  Shoulder abduction 125 140   130   Shoulder ER 30 60   52 50  Shoulder IR      20  Shoulder horizontal adduction      35   (Blank rows = not tested)    FUNCTIONAL TESTS:   12/04/23 : 5 time sit  to stand: 17.2 seconds no UE support 01/22/24: 5 time sit to stand: 9.2 seconds no UE support 02/18/24:  10.1 seconds no UE support  GAIT: Distance walked: clinic  distances, level surface Assistive device utilized: None Level of assistance: Complete Independence Comments: mild forward flexed trunk posture                                                                                                                                                                       TODAY'S TREATMENT                                                                           DATE:   03/17/24:   Nustep L5 X 12 min UE/LE Seated IR and ER of Rt shoulder See updated MMT on chart There Activities:  Leg Press: 100# bil LE's  3x 10, single leg 56# 2 x 10 bil Stepping over 9 inch hurdles forward and sideways x 4 each direction over 6 hurdles Manual:  Percussion over bilateral lumbar paraspinals, left QL and left glutes Education:  We discussed DN for pt to consider or his next visit and handout issued to pt   03/13/24:   Nu step L5 X 12 min UE/LE Supine SKTC x 3 holding 20 sec Supine IR/ER  shoulder abd 90 deg, shoulder stretch x 3 holding 10-15  There Activities:  Leg Press: 112# bil LE's  3x 10, single leg 56# 2 x 10 bil Stepping over 9 inch hurdles placed front, started reciprocal pattern with UE support, then worked on on UE support with step over pattern one leg at at time, then progressed to reciprocal step over without UE support Step ups x 15 on 6 inches Rows blue X 20 Shoulder extensions blue X 20 Diagonal chops blue X 20 bilat    03/10/24:  TherEx:  Scifit bike: 10 minutes level 4 UE/LE Supine SKTC x 3 holding 20 sec Supine IR/ER  shoulder abd 90 deg, shoulder stretch x 3 holding 10-15  There Activities:  Leg Press: 112# bil LE's  3x 10, single leg 56# 2 x 10 bil (pt reporting previous weight was too much today due to increased pain in his ow back) Stepping over 9 inch hurdles placed front and to the side x 15 c each LE Step ups x 15 on 6 inches  Self Care:  Pt was instructed in how to wrap band-aids around his proximal PCP joint to help with  his  trigger fingers. Band -aids applied to bil 4th digits.    PATIENT EDUCATION:  Education details: HEP, POC Person educated: Patient Education method: Programmer, multimedia, Demonstration, Verbal cues, and Handouts Education comprehension: verbalized understanding, returned demonstration, and verbal cues required  HOME EXERCISE PROGRAM: Access Code: [email or web address] URL: [email or web address] Date: 02/29/2024 Prepared by: Pauletta Browns  Exercises - Standing Shoulder Row with Anchored Resistance  - 1 x daily - 7 x weekly - 3 sets - 10 reps - Supine Bridge  - 2 x daily - 7 x weekly - 2 sets - 10 reps - 5 seconds hold - Supine Shoulder External Rotation in 45 Degrees Abduction AAROM with Dowel  - 1 x daily - 7 x weekly - 3 sets - 10 reps - Supine Shoulder Flexion Extension AAROM with Dowel  - 2-3 x daily - 7 x weekly - 2 sets - 10 reps - Seated Small Alternating Straight Leg Lifts with Heel Touch  - 2 x daily - 7 x weekly - 15-20 reps - Recumbent Bike  - 2-3 x daily - 7 x weekly - Bridge with Hip Abduction and Resistance  - 1-2 x daily - 7 x weekly - 2 sets - 10 reps - Supine Shoulder External Rotation Stretch  - 2 x daily - 7 x weekly - 1 sets - 10-20 reps - 10 seconds hold - Supine Shoulder Internal Rotation  - 2 x daily - 7 x weekly - 1 sets - 10-20 reps - 10 seconds hold - Standing Scapular Retraction  - 5 x daily - 7 x weekly - 1 sets - 5 reps - 5 second hold  ASSESSMENT:  CLINICAL IMPRESSION: Pt arriving today reporting he worked in his yard for 4-5 hours and reported pain of 8-9/10 pain last night keeping him up. Pt reporting once he began exercising his pain level decreased. Pt tolerating exercises and wishing for percussion device to help relax his low back. We discussed DN and pt issued a handout and will consider for his next session. Recommend continued skilled PT to maximize pt's function.   OBJECTIVE IMPAIRMENTS: difficulty walking, decreased ROM, decreased strength, increased  edema, impaired flexibility, impaired UE functional use, postural dysfunction, and pain.   ACTIVITY LIMITATIONS: lifting, bending, standing, squatting, sleeping, stairs, transfers, reach over head, and hygiene/grooming  PARTICIPATION LIMITATIONS: cleaning, community activity, and yard work  PERSONAL FACTORS: 3+ comorbidities: see pertinent history  are also affecting patient's functional outcome.   REHAB POTENTIAL: Good  CLINICAL DECISION MAKING: Evolving/moderate complexity  EVALUATION COMPLEXITY: High   GOALS: Goals reviewed with patient? Yes  SHORT TERM GOALS: (target date for Short term goals are 3 weeks 12/25/2023)   1.  Patient will demonstrate independent use of home exercise program to maintain progress from in clinic treatments.  Goal status:MET 02/18/24  2. Pt to improve his 5 time sit to stand to </=  13  seconds with UE support.   Goal Status: MET 02/18/24   LONG TERM GOALS: (target dates for all long term goals are 6 weeks 04/04/24)   1. Patient will demonstrate/report pain at worst less than or equal to 2/10 to facilitate minimal limitation in daily activity secondary to pain symptoms.  Goal status: On Going 02/29/2024   2. Patient will demonstrate independent use of home exercise program to facilitate ability to maintain/progress functional gains from skilled physical therapy services.  Goal status: New   3. Patient will demonstrate FOTO outcome > or = 51 % to indicate reduced disability due  to condition.  49.5% FOTO 02/18/24 Goal status: New   4.  Patient will be able to perform up and down 1 flight of stairs with reciprocal gait pattern with left knee pain and low back pain </= 2/10.   Goal status: New   5.  Patient will demonstrate improved Rt UE flexion to >/= 150 degrees in order to improve functional mobility.   Goal status: Met 02/29/2024   6.  Pt will improve Rt shoulder ER to >/= 60 deg in order to improve functional mobility.  Goal status: On Going  02/29/2024      PLAN:  PT FREQUENCY: 1-2x/week  PT DURATION: 6 weeks  PLANNED INTERVENTIONS: Can include 09811- PT Re-evaluation, 97110-Therapeutic exercises, 97530- Therapeutic activity, 97112- Neuromuscular re-education, 97535- Self Care, 97140- Manual therapy, [phone]- Gait training, [phone]- Orthotic Fit/training, [phone]- Canalith repositioning, [id number]- Aquatic Therapy, 97014- Electrical stimulation (unattended), [id number]- Electrical stimulation (manual), [id number]- Vasopneumatic device, [id number]- Ultrasound, [id number]- Traction (mechanical), [id number]- Ionotophoresis 4mg /ml Dexamethasone, Patient/Family education, Balance training, Stair training, Taping, Dry Needling, Joint mobilization, Joint manipulation, Spinal manipulation, Spinal mobilization, Scar mobilization, Vestibular training, Visual/preceptual remediation/compensation, DME instructions, Cryotherapy, and Moist heat.  All performed as medically necessary.  All included unless contraindicated  PLAN FOR NEXT SESSION:  Shoulder and back exercises, capsular stretching, body mechanics, low back and postural strengthening, try percussion or STM as needed  Re-certification: 02/18/24 for 6 more weeks   Sharmon Leyden, PT, MPT 03/17/2024, 10:32 AM

## 2024-03-17 NOTE — Patient Instructions (Signed)

## 2024-03-19 ENCOUNTER — Encounter: Payer: Self-pay | Admitting: Physical Therapy

## 2024-03-19 ENCOUNTER — Ambulatory Visit (INDEPENDENT_AMBULATORY_CARE_PROVIDER_SITE_OTHER): Admitting: Physical Therapy

## 2024-03-19 DIAGNOSIS — M25562 Pain in left knee: Secondary | ICD-10-CM

## 2024-03-19 DIAGNOSIS — M25511 Pain in right shoulder: Secondary | ICD-10-CM | POA: Diagnosis not present

## 2024-03-19 DIAGNOSIS — M6281 Muscle weakness (generalized): Secondary | ICD-10-CM

## 2024-03-19 DIAGNOSIS — G8929 Other chronic pain: Secondary | ICD-10-CM

## 2024-03-19 NOTE — Therapy (Signed)
 OUTPATIENT PHYSICAL THERAPY TREATMENT    Patient Name: Jose [name] MRN: [id number] DOB:1951-06-29, 73 y.o., male Today's Date: 03/19/2024  END OF SESSION:  PT End of Session - 03/19/24 0942     Visit Number 18    Number of Visits 22    Date for PT Re-Evaluation 04/04/24    PT Start Time 0936    PT Stop Time 1015    PT Time Calculation (min) 39 min    Activity Tolerance Patient tolerated treatment well;No increased pain    Behavior During Therapy WFL for tasks assessed/performed                    Past Medical History:  Diagnosis Date   Arthritis    R shoulder, great toes, hands- has gout & has injections in knees with Dr. Corliss Skains    Bicuspid aortic valve with ascending aorta 4.0 to 4.5 cm in diameter    CAD (coronary artery disease)    Cancer (HCC)    skin ca-    GERD (gastroesophageal reflux disease)    Headache    History of hiatal hernia    Hypertension    Prostate cancer (HCC)    S/P TAVR (transcatheter aortic valve replacement)    SDH (subdural hematoma) (HCC)    Severe aortic stenosis    Thoracic ascending aortic aneurysm (HCC)    Vertebral artery aneurysm [facility])    Past Surgical History:  Procedure Laterality Date   BRAIN SURGERY  2016   evacuation of SDH   CARDIAC CATHETERIZATION     CARPAL TUNNEL RELEASE Bilateral    embolization of left vertebral artery aneurysm with pipeline/coil  10/31/2022   GREEN LIGHT LASER TURP (TRANSURETHRAL RESECTION OF PROSTATE  04/27/2017   KNEE ARTHROPLASTY     LEFT HEART CATH AND CORONARY ANGIOGRAPHY N/A 10/08/2019   Procedure: LEFT HEART CATH AND CORONARY ANGIOGRAPHY;  Surgeon: Tonny Bollman, MD;  Location: [facility] INVASIVE CV LAB;  Service: Cardiovascular;  Laterality: N/A;   QUADRICEPS TENDON REPAIR Bilateral 06/15/2017   Procedure: REPAIR QUADRICEP TENDON;  Surgeon: Cammy Copa, MD;  Location: [facility] OR;  Service: Orthopedics;  Laterality: Bilateral;   SHOULDER SURGERY Right    TEE WITHOUT CARDIOVERSION  N/A 07/03/2022   Procedure: TRANSESOPHAGEAL ECHOCARDIOGRAM (TEE);  Surgeon: Wendall Stade, MD;  Location: [facility] ENDOSCOPY;  Service: Cardiovascular;  Laterality: N/A;   TRANSCATHETER AORTIC VALVE REPLACEMENT, TRANSFEMORAL  12/23/2019   TRANSCATHETER AORTIC VALVE REPLACEMENT, TRANSFEMORAL N/A 12/23/2019   Procedure: TRANSCATHETER AORTIC VALVE REPLACEMENT, TRANSFEMORAL;  Surgeon: Tonny Bollman, MD;  Location: [facility] OR;  Service: Open Heart Surgery;  Laterality: N/A;   VASCULAR SURGERY     Patient Active Problem List   Diagnosis Date Noted   Hypertension    Severe aortic stenosis 10/08/2019   Candidiasis    Abnormality of gait    Hypoalbuminemia due to protein-calorie malnutrition [facility])    History of gout    Sleep disturbance    Constipation    Rupture of quadriceps tendon, right, sequela 06/19/2017   Quadriceps tendon rupture, left, sequela 06/19/2017   Acute blood loss anemia 06/19/2017   Fall    History of subdural hematoma    Post-operative pain    Recurrent falls    Quadriceps tendon rupture 06/15/2017   Primary osteoarthritis of both knees 02/28/2017   Primary osteoarthritis of both hands 02/28/2017   Uricacidemia 02/28/2017   Pain in joint of left knee 02/07/2017   Idiopathic chronic gout, unspecified site, without tophus (  tophi) 11/29/2016   HEMATURIA UNSPECIFIED 01/25/2009   Abdominal pain 01/25/2009   XERODERMA 03/10/2008   UNS ADVRS EFF UNS RX MEDICINAL&BIOLOGICAL SBSTNC 03/10/2008   ADJUSTMENT REACTION WITH PHYSICAL SYMPTOMS 02/14/2008   MUSCLE SPASM 02/14/2008   ACUTE PROSTATITIS 12/16/2007   BREAST LUMP OR MASS, RIGHT 07/12/2007   H/O: RCT (rotator cuff tear) 01/13/2007   GOUT 01/08/2007    PCP: Earleen Reaper, MD   REFERRING PROVIDER: Cammy Copa, MD   REFERRING DIAG:  [phone] (ICD-10-CM) - Synovitis of left knee  M19.011 (ICD-10-CM) - Primary osteoarthritis, right shoulder  M54.50 (ICD-10-CM) - Low back pain, unspecified back pain laterality,  unspecified chronicity, unspecified whether sciatica present    THERAPY DIAG:  Chronic right shoulder pain  Muscle weakness (generalized)  Chronic pain of left knee  Rationale for Evaluation and Treatment: Rehabilitation  ONSET DATE: see subjective, on-going for years    PERTINENT HISTORY: HTN, severe aortic stenosis, abnormality of gait, h/o gout, rupture of quadriceps tendon on Rt 2018 on left 2018, subdural hematoma 2016, h/o falls, primary OA in bil hands Polio on Left side affecting Left UE strength  SUBJECTIVE:    SUBJECTIVE STATEMENT: Pt reporting working in the yard again yesterday with more sorness noted to his low back.    EVAL:  Pt stating he has been painting a lot at home and feels it may have exacerbated his Rt shoulder. Pt reporting radiation pain down his arm but no numbness/tingling. Pt stating Rt shoulder is giving him the most problem. Pt stating after getting the second shingles shot his shoulder pain increased into tricep and shoulder. Pt stating pain has been ongoing for about a year. Pt stating pain is worse at night.  Pt stating left knee pain has been ongoing since January 2018 when he was skiing and twisted his knee and he tore his quadriceps tendon. Pt stating he fell and when he fell he tore both tendons in bil LE. Pt then stating the repair didn't take on his left LE and it was repaired again. Pt stating he had PT and he gained ROM back in bil LE.  Pt reporting falling while in hospital during a bed transfer where he landed on his floor on his back in 2018. Pt stating his back has bothered him on/off ever since.  Pt stating he hasn't been able to work out over the past few months due to recent move into [place].   PAIN:  NPRS scale:5/10 today, 9/10 worse pain Pain description: achy, throbbing Aggravating factors: resting, tylenol, voltaren Relieving factors: Reaching, overhead function, spine flexion and slouched postures  PRECAUTIONS:  None  WEIGHT BEARING RESTRICTIONS: No  FALLS:  Has patient fallen in last 6 months? No falls in last few years. Haven't fell since he tore his quad tendons and during a hospital transfer several years ago  LIVING ENVIRONMENT: Lives with: lives with their family and lives with an adult companion Lives in: House/apartment Stairs: Yes: Internal: 17 steps; on left going up and External: 2 steps; on left going up Has following equipment at home: None  OCCUPATION: retired  PLOF: Independent  PATIENT GOALS: Be able to work out at gym with less pain  Next MD visit:   OBJECTIVE:   DIAGNOSTIC FINDINGS: Shoulder: 08/06/23  3 views of the right shoulder including Grashey, scapular Y and axial view  were ordered and reviewed by myself.  X-rays show severe osteoarthritis of  the right glenohumeral joint.  There is a large inferior spur off  the  humeral head.  There is some chondrocalcinosis superior to the humeral  head, likely indicative of pseudogout or prior gout.  No acute bony  fracture noted.  There is bony sclerosis noted of the humeral head and  inferior acetabulum from likely arthritic change.   Knee 05/22/23:   1. Postsurgical changes of quadriceps tendon repair. The tendon appears intact. Resolution of the prior small fluid bright signal within the midsubstance of the quadriceps tendon insertion on prior 10/01/2018 MRI. 2. Stable chronic oblique tear of the anterior horn of the lateral meniscus. Mild degenerative undersurface fraying of the posterior horn of the lateral meniscus. 3. Interval worsening of moderate to severe patellofemoral cartilage degenerative changes. 4. Mildly worsened cartilage loss within the anteromedial aspect of the weight-bearing lateral femoral condyle and adjacent lateral tibial plateau. 5. Unchanged high-grade partial to full-thickness cartilage loss within the weight-bearing medial femoral condyle.  Lumbar: 05/22/23:  AP lateral radiographs  lumbar spine reviewed.  Grade 1-2 spondylolisthesis present at L5-S1 with pars fracture noted at the corresponding level.   Mild to moderate degenerative changes present in the upper and and middle lumbar spine region.  Visualized hips without arthritis    PATIENT SURVEYS:  12/05/23: FOTO intake:  49%  predicted:  51% 12/31/23: FOTO update 48%  02/27/24: FOTO update 57%  COGNITION: Overall cognitive status: WFL    SENSATION: WFL   POSTURE:  rounded shoulders, forward head, and decreased lumbar lordosis  PALPATION: TTP: distal left quad tendon, Rt upper trap and levator and anterior shoulder  MMT   Right 12/04/23 Left 12/04/23 Rt  / Left 12/18/23 HHD ppsi Right 02/29/24 Rt 03/17/24  Hip flexion 5 5     Hip extension 5 5     Hip abduction 5 5     Hip adduction 5 5     Hip internal rotation       Hip external rotation       Knee flexion 5 5 52.5 / 40.0    Knee extension 5 5 54.4 / 65.5                     SHOULDER        Shoulder flexion 4 3     Shoulder abduction 4 3     Shoulder ER 4- 3  23.0 pounds 27.4 pounds  Shoulder IR 4- 3  33.4 pounds 39.4 pounds   (Blank rows = not tested)    ROM  LE Right 12/04/23 Left 12/04/23 Rt  01/22/24 Left 01/22/24 Active supine 02/18/24 Rt  Active standing 02/29/2024 Right  Hip flexion 108 110 112 112 140    Hip extension        Hip abduction        Hip adduction        Hip internal rotation        Hip external rotation        Knee flexion 134 126      Knee extension 0 0       SHOULDER:          Shoulder flexion 138 158 145 155 155 150  Shoulder abduction 125 140   130   Shoulder ER 30 60   52 50  Shoulder IR      20  Shoulder horizontal adduction      35   (Blank rows = not tested)    FUNCTIONAL TESTS:   12/04/23 : 5 time sit to stand: 17.2 seconds  no UE support 01/22/24: 5 time sit to stand: 9.2 seconds no UE support 02/18/24:  10.1 seconds no UE support  GAIT: Distance walked: clinic distances, level  surface Assistive device utilized: None Level of assistance: Complete Independence Comments: mild forward flexed trunk posture                                                                                                                                                                        TODAY'S TREATMENT                                                                           DATE:   03/19/24:   Nustep L6 X 10 min UE/LE Seated IR and ER of Rt shoulder Standing door stretch 90 deg holding 20 sec x 3 bil UE Seated lumbar stretch over green physio ball x 5  There Activities:  Leg Press: 106# bil LE's  3x 10, single leg 56# 2 x 15 bil Stepping over 9 inch hurdles forward and sideways x 10 each direction over 6 hurdles c weight shifting Manual:  Skilled palpation and active trigger point release Trigger Point Dry Needling Initial Treatment: Pt instructed on Dry Needling rational, procedures, and possible side effects. Pt instructed to expect mild to moderate muscle soreness later in the day and/or into the next day.  Pt instructed in methods to reduce muscle soreness. Pt instructed to continue prescribed HEP. Patient was educated on signs and symptoms of infection and other risk factors and advised to seek medical attention should they occur.  Patient verbalized understanding of these instructions and education.  Patient Verbal Consent Given: Yes Education Handout Provided: Previously Provided Muscles treated: left glutes Treatment response/outcome: local twitch response      03/17/24:   Nustep L5 X 12 min UE/LE Seated IR and ER of Rt shoulder See updated MMT on chart There Activities:  Leg Press: 100# bil LE's  3x 10, single leg 56# 2 x 10 bil Stepping over 9 inch hurdles forward and sideways x 4 each direction over 6 hurdles Manual:  Percussion over bilateral lumbar paraspinals, left QL and left glutes Education:  We discussed DN for pt to consider or his next visit and  handout issued to pt   03/13/24:   Nu step L5 X 12 min UE/LE Supine SKTC x 3 holding 20 sec Supine IR/ER  shoulder abd 90 deg, shoulder stretch x 3 holding 10-15  There Activities:  Leg Press: 112# bil LE's  3x 10, single leg 56# 2 x 10 bil Stepping over 9 inch hurdles placed front, started reciprocal pattern with UE support, then worked on on UE support with step over pattern one leg at at time, then progressed to reciprocal step over without UE support Step ups x 15 on 6 inches Rows blue X 20 Shoulder extensions blue X 20 Diagonal chops blue X 20 bilat      PATIENT EDUCATION:  Education details: HEP, POC Person educated: Patient Education method: Programmer, multimedia, Demonstration, Verbal cues, and Handouts Education comprehension: verbalized understanding, returned demonstration, and verbal cues required  HOME EXERCISE PROGRAM: Access Code: [email or web address] URL: [email or web address] Date: 02/29/2024 Prepared by: Pauletta Browns  Exercises - Standing Shoulder Row with Anchored Resistance  - 1 x daily - 7 x weekly - 3 sets - 10 reps - Supine Bridge  - 2 x daily - 7 x weekly - 2 sets - 10 reps - 5 seconds hold - Supine Shoulder External Rotation in 45 Degrees Abduction AAROM with Dowel  - 1 x daily - 7 x weekly - 3 sets - 10 reps - Supine Shoulder Flexion Extension AAROM with Dowel  - 2-3 x daily - 7 x weekly - 2 sets - 10 reps - Seated Small Alternating Straight Leg Lifts with Heel Touch  - 2 x daily - 7 x weekly - 15-20 reps - Recumbent Bike  - 2-3 x daily - 7 x weekly - Bridge with Hip Abduction and Resistance  - 1-2 x daily - 7 x weekly - 2 sets - 10 reps - Supine Shoulder External Rotation Stretch  - 2 x daily - 7 x weekly - 1 sets - 10-20 reps - 10 seconds hold - Supine Shoulder Internal Rotation  - 2 x daily - 7 x weekly - 1 sets - 10-20 reps - 10 seconds hold - Standing Scapular Retraction  - 5 x daily - 7 x weekly - 1 sets - 5 reps - 5 second  hold  ASSESSMENT:  CLINICAL IMPRESSION: Pt reporting 5/10 pain upon arrival. Pt wishing to try DN to lumbar paraspinals with good tolerance. Continue skilled PT interventions.   OBJECTIVE IMPAIRMENTS: difficulty walking, decreased ROM, decreased strength, increased edema, impaired flexibility, impaired UE functional use, postural dysfunction, and pain.   ACTIVITY LIMITATIONS: lifting, bending, standing, squatting, sleeping, stairs, transfers, reach over head, and hygiene/grooming  PARTICIPATION LIMITATIONS: cleaning, community activity, and yard work  PERSONAL FACTORS: 3+ comorbidities: see pertinent history  are also affecting patient's functional outcome.   REHAB POTENTIAL: Good  CLINICAL DECISION MAKING: Evolving/moderate complexity  EVALUATION COMPLEXITY: High   GOALS: Goals reviewed with patient? Yes  SHORT TERM GOALS: (target date for Short term goals are 3 weeks 12/25/2023)   1.  Patient will demonstrate independent use of home exercise program to maintain progress from in clinic treatments.  Goal status:MET 02/18/24  2. Pt to improve his 5 time sit to stand to </=  13  seconds with UE support.   Goal Status: MET 02/18/24   LONG TERM GOALS: (target dates for all long term goals are 6 weeks 04/04/24)   1. Patient will demonstrate/report pain at worst less than or equal to 2/10 to facilitate minimal limitation in daily activity secondary to pain symptoms.  Goal status: On Going 02/29/2024   2. Patient will demonstrate independent use of home exercise program to facilitate ability to maintain/progress functional gains from skilled physical therapy services.  Goal status: New  3. Patient will demonstrate FOTO outcome > or = 51 % to indicate reduced disability due to condition.  49.5% FOTO 02/18/24 Goal status: New   4.  Patient will be able to perform up and down 1 flight of stairs with reciprocal gait pattern with left knee pain and low back pain </= 2/10.   Goal status:  New   5.  Patient will demonstrate improved Rt UE flexion to >/= 150 degrees in order to improve functional mobility.   Goal status: Met 02/29/2024   6.  Pt will improve Rt shoulder ER to >/= 60 deg in order to improve functional mobility.  Goal status: On Going 02/29/2024      PLAN:  PT FREQUENCY: 1-2x/week  PT DURATION: 6 weeks  PLANNED INTERVENTIONS: Can include 29528- PT Re-evaluation, 97110-Therapeutic exercises, 97530- Therapeutic activity, 97112- Neuromuscular re-education, 97535- Self Care, 97140- Manual therapy, [phone]- Gait training, [phone]- Orthotic Fit/training, [phone]- Canalith repositioning, [id number]- Aquatic Therapy, 97014- Electrical stimulation (unattended), [id number]- Electrical stimulation (manual), [id number]- Vasopneumatic device, [id number]- Ultrasound, [id number]- Traction (mechanical), [id number]- Ionotophoresis 4mg /ml Dexamethasone, Patient/Family education, Balance training, Stair training, Taping, Dry Needling, Joint mobilization, Joint manipulation, Spinal manipulation, Spinal mobilization, Scar mobilization, Vestibular training, Visual/preceptual remediation/compensation, DME instructions, Cryotherapy, and Moist heat.  All performed as medically necessary.  All included unless contraindicated  PLAN FOR NEXT SESSION:  Shoulder and back exercises, capsular stretching, body mechanics, low back and postural strengthening, try percussion or STM as needed  Re-certification: 02/18/24 for 6 more weeks   Sharmon Leyden, PT, MPT 03/19/2024, 10:57 AM

## 2024-03-19 NOTE — Progress Notes (Unsigned)
 Office Visit Note  Patient: Jose [name]             Date of Birth: 03/31/1951           MRN: [id number]             PCP: Earleen Reaper, MD Referring: Earleen Reaper, MD Visit Date: 03/21/2024 Occupation: @GUAROCC @  Subjective:  Right wrist pain   History of Present Illness: Jose [name] is a 73 y.o. male with gout and osteoarthritis.  He states about 2 weeks ago he was doing some yard work and after that he started having pain and swelling in his right wrist.  He states that his wrist continues to be swollen.  At the right ring trigger finger has recurred.  He has not had any gout flares.  He states his right shoulder joint has been hurting.  He had a cortisone injection in his left shoulder by Dr. August Saucer about 6 weeks ago which helped.  He is going to physical therapy for shoulders, knees and balance.    Activities of Daily Living:  Patient reports morning stiffness for 15 minutes.   Patient Denies nocturnal pain.  Difficulty dressing/grooming: Denies Difficulty climbing stairs: Reports Difficulty getting out of chair: Denies Difficulty using hands for taps, buttons, cutlery, and/or writing: Denies  Review of Systems  Constitutional:  Negative for fatigue.  HENT:  Negative for mouth sores and mouth dryness.   Eyes:  Negative for dryness.  Respiratory:  Negative for shortness of breath.   Cardiovascular:  Negative for chest pain and palpitations.  Gastrointestinal:  Negative for blood in stool, constipation and diarrhea.  Endocrine: Negative for increased urination.  Genitourinary:  Negative for involuntary urination.  Musculoskeletal:  Positive for joint pain, joint pain, myalgias, morning stiffness, muscle tenderness and myalgias. Negative for gait problem, joint swelling and muscle weakness.  Skin:  Negative for color change, rash, hair loss and sensitivity to sunlight.  Allergic/Immunologic: Negative for susceptible to infections.  Neurological:  Negative for  dizziness and headaches.  Hematological:  Negative for swollen glands.  Psychiatric/Behavioral:  Positive for sleep disturbance. Negative for depressed mood. The patient is not nervous/anxious.     PMFS History:  Patient Active Problem List   Diagnosis Date Noted   Hypertension    Severe aortic stenosis 10/08/2019   Candidiasis    Abnormality of gait    Hypoalbuminemia due to protein-calorie malnutrition [facility]    History of gout    Sleep disturbance    Constipation    Rupture of quadriceps tendon, right, sequela 06/19/2017   Quadriceps tendon rupture, left, sequela 06/19/2017   Acute blood loss anemia 06/19/2017   Fall    History of subdural hematoma    Post-operative pain    Recurrent falls    Quadriceps tendon rupture 06/15/2017   Primary osteoarthritis of both knees 02/28/2017   Primary osteoarthritis of both hands 02/28/2017   Uricacidemia 02/28/2017   Pain in joint of left knee 02/07/2017   Idiopathic chronic gout, unspecified site, without tophus (tophi) 11/29/2016   HEMATURIA UNSPECIFIED 01/25/2009   Abdominal pain 01/25/2009   XERODERMA 03/10/2008   UNS ADVRS EFF UNS RX MEDICINAL&BIOLOGICAL SBSTNC 03/10/2008   ADJUSTMENT REACTION WITH PHYSICAL SYMPTOMS 02/14/2008   MUSCLE SPASM 02/14/2008   ACUTE PROSTATITIS 12/16/2007   BREAST LUMP OR MASS, RIGHT 07/12/2007   H/O: RCT (rotator cuff tear) 01/13/2007   GOUT 01/08/2007    Past Medical History:  Diagnosis Date   Arthritis  R shoulder, great toes, hands- has gout & has injections in knees with Dr. Corliss Skains    Bicuspid aortic valve with ascending aorta 4.0 to 4.5 cm in diameter    CAD (coronary artery disease)    Cancer (HCC)    skin ca-    GERD (gastroesophageal reflux disease)    Headache    History of hiatal hernia    Hypertension    Prostate cancer (HCC)    S/P TAVR (transcatheter aortic valve replacement)    SDH (subdural hematoma) (HCC)    Severe aortic stenosis    Thoracic ascending aortic aneurysm  (HCC)    Vertebral artery aneurysm (HCC)     Family History  Problem Relation Age of Onset   Parkinson's disease Mother    Heart disease Father    Parkinson's disease Brother    Diabetes Brother    Past Surgical History:  Procedure Laterality Date   BRAIN SURGERY  2016   evacuation of SDH   CARDIAC CATHETERIZATION     CARPAL TUNNEL RELEASE Bilateral    embolization of left vertebral artery aneurysm with pipeline/coil  10/31/2022   GREEN LIGHT LASER TURP (TRANSURETHRAL RESECTION OF PROSTATE  04/27/2017   KNEE ARTHROPLASTY     LEFT HEART CATH AND CORONARY ANGIOGRAPHY N/A 10/08/2019   Procedure: LEFT HEART CATH AND CORONARY ANGIOGRAPHY;  Surgeon: Tonny Bollman, MD;  Location: [facility] INVASIVE CV LAB;  Service: Cardiovascular;  Laterality: N/A;   QUADRICEPS TENDON REPAIR Bilateral 06/15/2017   Procedure: REPAIR QUADRICEP TENDON;  Surgeon: Cammy Copa, MD;  Location: [facility] OR;  Service: Orthopedics;  Laterality: Bilateral;   SHOULDER SURGERY Right    TEE WITHOUT CARDIOVERSION N/A 07/03/2022   Procedure: TRANSESOPHAGEAL ECHOCARDIOGRAM (TEE);  Surgeon: Wendall Stade, MD;  Location: [facility] ENDOSCOPY;  Service: Cardiovascular;  Laterality: N/A;   TRANSCATHETER AORTIC VALVE REPLACEMENT, TRANSFEMORAL  12/23/2019   TRANSCATHETER AORTIC VALVE REPLACEMENT, TRANSFEMORAL N/A 12/23/2019   Procedure: TRANSCATHETER AORTIC VALVE REPLACEMENT, TRANSFEMORAL;  Surgeon: Tonny Bollman, MD;  Location: [facility] OR;  Service: Open Heart Surgery;  Laterality: N/A;   VASCULAR SURGERY     Social History   Social History Narrative   Not on file   Immunization History  Administered Date(s) Administered   PFIZER(Purple Top)SARS-COV-2 Vaccination 02/19/2020, 03/17/2020, 10/25/2020, 08/30/2021   Pfizer Fall 2024 Covid-19 Vaccine 45yrs thru 75yrs. 10/04/2022     Objective: Vital Signs: BP 104/69 (BP Location: Left Arm, Patient Position: Sitting, Cuff Size: Large)   Pulse (!) 56   Resp 14   Ht 6' (1.829 m)   Wt  203 lb (92.1 kg)   BMI 27.53 kg/m    Physical Exam Vitals and nursing note reviewed.  Constitutional:      Appearance: He is well-developed.  HENT:     Head: Normocephalic and atraumatic.  Eyes:     Conjunctiva/sclera: Conjunctivae normal.     Pupils: Pupils are equal, round, and reactive to light.  Cardiovascular:     Rate and Rhythm: Normal rate and regular rhythm.     Heart sounds: Normal heart sounds.  Pulmonary:     Effort: Pulmonary effort is normal.     Breath sounds: Normal breath sounds.  Abdominal:     General: Bowel sounds are normal.     Palpations: Abdomen is soft.  Musculoskeletal:     Cervical back: Normal range of motion and neck supple.  Skin:    General: Skin is warm and dry.     Capillary Refill: Capillary refill takes less  than 2 seconds.  Neurological:     Mental Status: He is alert and oriented to person, place, and time.  Psychiatric:        Behavior: Behavior normal.      Musculoskeletal Exam: Cervical spine was in good range of motion.  There was no tenderness over thoracic or lumbar spine.  Shoulder joint abduction and flexion was limited to 30 degrees bilaterally with discomfort.  He had limited internal rotation bilaterally.  Elbow joints were in good range of motion.  He has swelling over the right wrist joint with painful range of motion.  Bilateral PIP and DIP thickening was noted.  Hip joints and knee joints were in good range of motion.  There was no tenderness over ankles or MTPs.  CDAI Exam: CDAI Score: -- Patient Global: --; Provider Global: -- Swollen: --; Tender: -- Joint Exam 03/21/2024   No joint exam has been documented for this visit   There is currently no information documented on the homunculus. Go to the Rheumatology activity and complete the homunculus joint exam.  Investigation: No additional findings.  Imaging: No results found.  Recent Labs: Lab Results  Component Value Date   WBC 7.7 12/27/2023   HGB 14.8  12/27/2023   PLT 156 12/27/2023   NA 142 12/27/2023   K 4.5 12/27/2023   CL 105 12/27/2023   CO2 27 12/27/2023   GLUCOSE 84 12/27/2023   BUN 21 12/27/2023   CREATININE 1.21 12/27/2023   BILITOT 0.5 12/27/2023   ALKPHOS 72 09/11/2023   AST 19 12/27/2023   ALT 17 12/27/2023   PROT 6.7 12/27/2023   ALBUMIN 4.2 09/11/2023   CALCIUM 9.9 12/27/2023   GFRAA 70 05/05/2021    Speciality Comments: No specialty comments available.  Procedures:  Ultrasound guided injection is preferred based studies that show increased duration, increased effect, greater accuracy, decreased procedural pain, increased response rate, and decreased cost with ultrasound guided versus blind injection.   Verbal informed consent obtained.  Time-out conducted.  Noted no overlying erythema, induration, or other signs of local infection. Ultrasound-guided intercarpal joint injection: After sterile prep with Betadine, injected 1 mL of 1% lidocaine and 30 mg Kenalog using a 27-gauge needle, and the intercarpal joint.   Medium Joint Inj: R intercarpal on 03/21/2024 12:17 PM Indications: pain and joint swelling Details: 27 G 1.5 in needle, ultrasound-guided dorsal approach Medications: 1 mL lidocaine 1 %; 30 mg triamcinolone acetonide 40 MG/ML Aspirate: 0 mL Procedure, treatment alternatives, risks and benefits explained, specific risks discussed. Immediately prior to procedure a time out was called to verify the correct patient, procedure, equipment, support staff and site/side marked as required. Patient was prepped and draped in the usual sterile fashion.     Allergies: Ciprofloxacin and Quinolones   Assessment / Plan:     Visit Diagnoses: Idiopathic chronic gout of multiple sites without tophus - uric acid: 3.5 on 12/27/2023.  Patient denies having a flare of gouty arthropathy since the last visit.  He has been watching his diet and taking allopurinol 300 mg daily without any interruption.  Medication monitoring encounter  - Allopurinol 300 mg p.o. daily.  December 27, 2023 CBC and CMP were normal.  Uric acid 3.5.  Chronic right shoulder pain - He had right glenohumeral joint injection by Dr. Madelyn Brunner on August 06, 2023 for severe osteoarthritis.  Chronic left shoulder pain-she states the pain improved after the cortisone injection given by Dr. August Saucer about 3 weeks ago.  Primary osteoarthritis of  both hands-he is also arthritis in his both hands.  Pain in right wrist-patient states for the last 2 to 3 weeks he has been having severe pain and discomfort in his right wrist.  He states the pain started after doing the yard work.  He requested a cortisone injection.  X-ray wrist complete right: X-rays of the right wrist joint showed right CMC narrowing.  No acute pathology was noted.  X-ray findings were reviewed with the patient.  Per his request after side effects were discussed right wrist joint were injected with lidocaine and Kenalog as described above.  He tolerated the procedure well.  Postprocedure instructions were given.  He was advised to use an Ace bandage and apply ice for 2 to 3 days.   Trigger finger, right ring finger - He had 2 injections and A1 pulley and 1 injection in A3 pulley. right ring finger A1 pulley region was injected 12/23.  Resolved.  Trigger little finger of right hand-he reports intermittent triggering.  It was not triggering today.He had 2 injections and A1 pulley and 1 injection in A3 pulley. right ring finger A1 pulley region was injected 12/23.   Primary osteoarthritis of both knees - Left knee joint injected by Dr. August Saucer in July 2024.  Patient had no recurrence of pain since the cortisone injection.  Quadriceps tendon rupture, left, sequela  Primary osteoarthritis of both feet-currently symptomatic.  Neuropathy  Subdural hematoma (HCC)  H/O aortic valve replacement  [place]  Orders: Orders Placed This Encounter  Procedures   XR Wrist Complete Right   No orders of the  defined types were placed in this encounter.    Follow-Up Instructions: Return in about 6 months (around 09/20/2024) for Gout, Osteoarthritis.   Pollyann Savoy, MD  Note - This record has been created using Animal nutritionist.  Chart creation errors have been sought, but may not always  have been located. Such creation errors do not reflect on  the standard of medical care.

## 2024-03-21 ENCOUNTER — Ambulatory Visit: Attending: Rheumatology | Admitting: Rheumatology

## 2024-03-21 ENCOUNTER — Ambulatory Visit

## 2024-03-21 ENCOUNTER — Encounter: Payer: Self-pay | Admitting: Rheumatology

## 2024-03-21 VITALS — BP 104/69 | HR 56 | Resp 14 | Ht 72.0 in | Wt 203.0 lb

## 2024-03-21 DIAGNOSIS — M25531 Pain in right wrist: Secondary | ICD-10-CM | POA: Insufficient documentation

## 2024-03-21 DIAGNOSIS — M19041 Primary osteoarthritis, right hand: Secondary | ICD-10-CM | POA: Insufficient documentation

## 2024-03-21 DIAGNOSIS — M170 Bilateral primary osteoarthritis of knee: Secondary | ICD-10-CM | POA: Diagnosis present

## 2024-03-21 DIAGNOSIS — S065XAA Traumatic subdural hemorrhage with loss of consciousness status unknown, initial encounter: Secondary | ICD-10-CM | POA: Insufficient documentation

## 2024-03-21 DIAGNOSIS — A809 Acute poliomyelitis, unspecified: Secondary | ICD-10-CM | POA: Insufficient documentation

## 2024-03-21 DIAGNOSIS — S76112S Strain of left quadriceps muscle, fascia and tendon, sequela: Secondary | ICD-10-CM | POA: Insufficient documentation

## 2024-03-21 DIAGNOSIS — Z5181 Encounter for therapeutic drug level monitoring: Secondary | ICD-10-CM | POA: Insufficient documentation

## 2024-03-21 DIAGNOSIS — M19042 Primary osteoarthritis, left hand: Secondary | ICD-10-CM | POA: Insufficient documentation

## 2024-03-21 DIAGNOSIS — M19072 Primary osteoarthritis, left ankle and foot: Secondary | ICD-10-CM | POA: Diagnosis present

## 2024-03-21 DIAGNOSIS — M65351 Trigger finger, right little finger: Secondary | ICD-10-CM | POA: Diagnosis present

## 2024-03-21 DIAGNOSIS — Z952 Presence of prosthetic heart valve: Secondary | ICD-10-CM | POA: Diagnosis present

## 2024-03-21 DIAGNOSIS — G8929 Other chronic pain: Secondary | ICD-10-CM | POA: Diagnosis present

## 2024-03-21 DIAGNOSIS — G629 Polyneuropathy, unspecified: Secondary | ICD-10-CM | POA: Insufficient documentation

## 2024-03-21 DIAGNOSIS — M1A09X0 Idiopathic chronic gout, multiple sites, without tophus (tophi): Secondary | ICD-10-CM | POA: Insufficient documentation

## 2024-03-21 DIAGNOSIS — M25512 Pain in left shoulder: Secondary | ICD-10-CM | POA: Diagnosis present

## 2024-03-21 DIAGNOSIS — M65341 Trigger finger, right ring finger: Secondary | ICD-10-CM | POA: Diagnosis present

## 2024-03-21 DIAGNOSIS — M19071 Primary osteoarthritis, right ankle and foot: Secondary | ICD-10-CM | POA: Insufficient documentation

## 2024-03-21 DIAGNOSIS — M25511 Pain in right shoulder: Secondary | ICD-10-CM | POA: Diagnosis present

## 2024-03-21 MED ORDER — TRIAMCINOLONE ACETONIDE 40 MG/ML IJ SUSP
30.0000 mg | INTRAMUSCULAR | Status: AC | PRN
  Administered 2024-03-21: 30 mg via INTRA_ARTICULAR

## 2024-03-21 MED ORDER — LIDOCAINE HCL 1 % IJ SOLN
1.0000 mL | INTRAMUSCULAR | Status: AC | PRN
  Administered 2024-03-21: 1 mL

## 2024-03-26 ENCOUNTER — Ambulatory Visit (INDEPENDENT_AMBULATORY_CARE_PROVIDER_SITE_OTHER): Admitting: Physical Therapy

## 2024-03-26 DIAGNOSIS — M25562 Pain in left knee: Secondary | ICD-10-CM | POA: Diagnosis not present

## 2024-03-26 DIAGNOSIS — M5459 Other low back pain: Secondary | ICD-10-CM

## 2024-03-26 DIAGNOSIS — G8929 Other chronic pain: Secondary | ICD-10-CM

## 2024-03-26 DIAGNOSIS — M6281 Muscle weakness (generalized): Secondary | ICD-10-CM | POA: Diagnosis not present

## 2024-03-26 DIAGNOSIS — M25511 Pain in right shoulder: Secondary | ICD-10-CM | POA: Diagnosis not present

## 2024-03-26 NOTE — Therapy (Signed)
 OUTPATIENT PHYSICAL THERAPY TREATMENT Re-certification    Patient Name: Jose [name] MRN: [id number] DOB:1951-11-08, 73 y.o., male Today's Date: 03/26/2024  END OF SESSION:  PT End of Session - 03/26/24 1203     Visit Number 19    Number of Visits 22    Date for PT Re-Evaluation 05/21/24    Authorization Type Re-certification for 1-2x/ week for up to 8 weeks    PT Start Time 1145    PT Stop Time 1238    PT Time Calculation (min) 53 min    Activity Tolerance Patient tolerated treatment well;No increased pain    Behavior During Therapy WFL for tasks assessed/performed                    Past Medical History:  Diagnosis Date   Arthritis    R shoulder, great toes, hands- has gout & has injections in knees with Dr. Corliss Skains    Bicuspid aortic valve with ascending aorta 4.0 to 4.5 cm in diameter    CAD (coronary artery disease)    Cancer (HCC)    skin ca-    GERD (gastroesophageal reflux disease)    Headache    History of hiatal hernia    Hypertension    Prostate cancer (HCC)    S/P TAVR (transcatheter aortic valve replacement)    SDH (subdural hematoma) (HCC)    Severe aortic stenosis    Thoracic ascending aortic aneurysm (HCC)    Vertebral artery aneurysm [facility])    Past Surgical History:  Procedure Laterality Date   BRAIN SURGERY  2016   evacuation of SDH   CARDIAC CATHETERIZATION     CARPAL TUNNEL RELEASE Bilateral    embolization of left vertebral artery aneurysm with pipeline/coil  10/31/2022   GREEN LIGHT LASER TURP (TRANSURETHRAL RESECTION OF PROSTATE  04/27/2017   KNEE ARTHROPLASTY     LEFT HEART CATH AND CORONARY ANGIOGRAPHY N/A 10/08/2019   Procedure: LEFT HEART CATH AND CORONARY ANGIOGRAPHY;  Surgeon: Tonny Bollman, MD;  Location: [facility] INVASIVE CV LAB;  Service: Cardiovascular;  Laterality: N/A;   QUADRICEPS TENDON REPAIR Bilateral 06/15/2017   Procedure: REPAIR QUADRICEP TENDON;  Surgeon: Cammy Copa, MD;  Location: [facility] OR;  Service:  Orthopedics;  Laterality: Bilateral;   SHOULDER SURGERY Right    TEE WITHOUT CARDIOVERSION N/A 07/03/2022   Procedure: TRANSESOPHAGEAL ECHOCARDIOGRAM (TEE);  Surgeon: Wendall Stade, MD;  Location: [facility] ENDOSCOPY;  Service: Cardiovascular;  Laterality: N/A;   TRANSCATHETER AORTIC VALVE REPLACEMENT, TRANSFEMORAL  12/23/2019   TRANSCATHETER AORTIC VALVE REPLACEMENT, TRANSFEMORAL N/A 12/23/2019   Procedure: TRANSCATHETER AORTIC VALVE REPLACEMENT, TRANSFEMORAL;  Surgeon: Tonny Bollman, MD;  Location: [facility] OR;  Service: Open Heart Surgery;  Laterality: N/A;   VASCULAR SURGERY     Patient Active Problem List   Diagnosis Date Noted   Hypertension    Severe aortic stenosis 10/08/2019   Candidiasis    Abnormality of gait    Hypoalbuminemia due to protein-calorie malnutrition [facility])    History of gout    Sleep disturbance    Constipation    Rupture of quadriceps tendon, right, sequela 06/19/2017   Quadriceps tendon rupture, left, sequela 06/19/2017   Acute blood loss anemia 06/19/2017   Fall    History of subdural hematoma    Post-operative pain    Recurrent falls    Quadriceps tendon rupture 06/15/2017   Primary osteoarthritis of both knees 02/28/2017   Primary osteoarthritis of both hands 02/28/2017   Uricacidemia 02/28/2017   Pain  in joint of left knee 02/07/2017   Idiopathic chronic gout, unspecified site, without tophus (tophi) 11/29/2016   HEMATURIA UNSPECIFIED 01/25/2009   Abdominal pain 01/25/2009   XERODERMA 03/10/2008   UNS ADVRS EFF UNS RX MEDICINAL&BIOLOGICAL SBSTNC 03/10/2008   ADJUSTMENT REACTION WITH PHYSICAL SYMPTOMS 02/14/2008   MUSCLE SPASM 02/14/2008   ACUTE PROSTATITIS 12/16/2007   BREAST LUMP OR MASS, RIGHT 07/12/2007   H/O: RCT (rotator cuff tear) 01/13/2007   GOUT 01/08/2007    PCP: Earleen Reaper, MD   REFERRING PROVIDER: Cammy Copa, MD   REFERRING DIAG:  [phone] (ICD-10-CM) - Synovitis of left knee  M19.011 (ICD-10-CM) - Primary  osteoarthritis, right shoulder  M54.50 (ICD-10-CM) - Low back pain, unspecified back pain laterality, unspecified chronicity, unspecified whether sciatica present    THERAPY DIAG:  Chronic right shoulder pain  Muscle weakness (generalized)  Chronic pain of left knee  Other low back pain  Rationale for Evaluation and Treatment: Rehabilitation  ONSET DATE: see subjective, on-going for years    PERTINENT HISTORY: HTN, severe aortic stenosis, abnormality of gait, h/o gout, rupture of quadriceps tendon on Rt 2018 on left 2018, subdural hematoma 2016, h/o falls, primary OA in bil hands Polio on Left side affecting Left UE strength  SUBJECTIVE:    SUBJECTIVE STATEMENT: Pt reporting working in the yard again yesterday with more sorness noted to his low back.    EVAL:  Pt stating he has been painting a lot at home and feels it may have exacerbated his Rt shoulder. Pt reporting radiation pain down his arm but no numbness/tingling. Pt stating Rt shoulder is giving him the most problem. Pt stating after getting the second shingles shot his shoulder pain increased into tricep and shoulder. Pt stating pain has been ongoing for about a year. Pt stating pain is worse at night.  Pt stating left knee pain has been ongoing since January 2018 when he was skiing and twisted his knee and he tore his quadriceps tendon. Pt stating he fell and when he fell he tore both tendons in bil LE. Pt then stating the repair didn't take on his left LE and it was repaired again. Pt stating he had PT and he gained ROM back in bil LE.  Pt reporting falling while in hospital during a bed transfer where he landed on his floor on his back in 2018. Pt stating his back has bothered him on/off ever since.  Pt stating he hasn't been able to work out over the past few months due to recent move into [place].   PAIN:  NPRS scale:5/10 today, 9/10 worse pain Pain description: achy, throbbing Aggravating factors: resting,  tylenol, voltaren Relieving factors: Reaching, overhead function, spine flexion and slouched postures  PRECAUTIONS: None  WEIGHT BEARING RESTRICTIONS: No  FALLS:  Has patient fallen in last 6 months? No falls in last few years. Haven't fell since he tore his quad tendons and during a hospital transfer several years ago  LIVING ENVIRONMENT: Lives with: lives with their family and lives with an adult companion Lives in: House/apartment Stairs: Yes: Internal: 17 steps; on left going up and External: 2 steps; on left going up Has following equipment at home: None  OCCUPATION: retired  PLOF: Independent  PATIENT GOALS: Be able to work out at gym with less pain  Next MD visit:   OBJECTIVE:   DIAGNOSTIC FINDINGS: Shoulder: 08/06/23  3 views of the right shoulder including Grashey, scapular Y and axial view  were ordered and reviewed by  myself.  X-rays show severe osteoarthritis of  the right glenohumeral joint.  There is a large inferior spur off the  humeral head.  There is some chondrocalcinosis superior to the humeral  head, likely indicative of pseudogout or prior gout.  No acute bony  fracture noted.  There is bony sclerosis noted of the humeral head and  inferior acetabulum from likely arthritic change.   Knee 05/22/23:   1. Postsurgical changes of quadriceps tendon repair. The tendon appears intact. Resolution of the prior small fluid bright signal within the midsubstance of the quadriceps tendon insertion on prior 10/01/2018 MRI. 2. Stable chronic oblique tear of the anterior horn of the lateral meniscus. Mild degenerative undersurface fraying of the posterior horn of the lateral meniscus. 3. Interval worsening of moderate to severe patellofemoral cartilage degenerative changes. 4. Mildly worsened cartilage loss within the anteromedial aspect of the weight-bearing lateral femoral condyle and adjacent lateral tibial plateau. 5. Unchanged high-grade partial to  full-thickness cartilage loss within the weight-bearing medial femoral condyle.  Lumbar: 05/22/23:  AP lateral radiographs lumbar spine reviewed.  Grade 1-2 spondylolisthesis present at L5-S1 with pars fracture noted at the corresponding level.   Mild to moderate degenerative changes present in the upper and and middle lumbar spine region.  Visualized hips without arthritis    PATIENT SURVEYS:  12/05/23: FOTO intake:  49%  predicted:  51% 12/31/23: FOTO update 48%  02/27/24: FOTO update 57%  COGNITION: Overall cognitive status: WFL    SENSATION: WFL   POSTURE:  rounded shoulders, forward head, and decreased lumbar lordosis  PALPATION: TTP: distal left quad tendon, Rt upper trap and levator and anterior shoulder  MMT   Right 12/04/23 Left 12/04/23 Rt  / Left 12/18/23 HHD ppsi Right 02/29/24 Rt 03/17/24 Rt 03/26/24 Lt 03/26/24  Hip flexion 5 5       Hip extension 5 5       Hip abduction 5 5       Hip adduction 5 5       Hip internal rotation         Hip external rotation         Knee flexion 5 5 52.5 / 40.0   64.1 51.0  Knee extension 5 5 54.4 / 65.5    52.1 64.3                  SHOULDER          Shoulder flexion 4 3       Shoulder abduction 4 3       Shoulder ER 4- 3  23.0 pounds 27.4 pounds 28.9 pounds   Shoulder IR 4- 3  33.4 pounds 39.4 pounds 39.6 pounds    (Blank rows = not tested)    ROM  LE Right 12/04/23 Left 12/04/23 Rt  01/22/24 Left 01/22/24 Active supine 02/18/24 Rt  Active standing 02/29/2024 Right  Hip flexion 108 110 112 112 140    Hip extension        Hip abduction        Hip adduction        Hip internal rotation        Hip external rotation        Knee flexion 134 126      Knee extension 0 0       SHOULDER:          Shoulder flexion 138 158 145 155 155 150  Shoulder abduction 125 140   130  Shoulder ER 30 60   52 50  Shoulder IR      20  Shoulder horizontal adduction      35   (Blank rows = not tested)    FUNCTIONAL TESTS:    12/04/23 : 5 time sit to stand: 17.2 seconds no UE support 01/22/24: 5 time sit to stand: 9.2 seconds no UE support 02/18/24:  10.1 seconds no UE support  GAIT: Distance walked: clinic distances, level surface Assistive device utilized: None Level of assistance: Complete Independence Comments: mild forward flexed trunk posture                                                                                                                                                                        TODAY'S TREATMENT                                                                           DATE:   03/26/24:   Nustep L6 X 10 min UE/LE Seated IR and ER of Rt shoulder isometrics Knee flexion/extension MMT and updated ROM measurements see above There Activities:  Leg Press: 112# bil LE's  3x 10, single leg 56# 2 x 15 bil Sit to stand: x 10  NMR:  Tandem walking in parallel bars x 3 with no UE support on level surface Single leg stance with toe touch on opposite LE x 5 holding 30 sec bil LE c intermittent UE support as needed Standing on BOSU ball x 3 minutes with intermittent UE support working on upright postural control Manual:  Percussion to lumbar paraspinals, QL, and glutes x 8 minutes    03/19/24:   Nustep L6 X 10 min UE/LE Seated IR and ER of Rt shoulder Standing door stretch 90 deg holding 20 sec x 3 bil UE Seated lumbar stretch over green physio ball x 5  There Activities:  Leg Press: 106# bil LE's  3x 10, single leg 56# 2 x 15 bil Stepping over 9 inch hurdles forward and sideways x 10 each direction over 6 hurdles c weight shifting Manual:  Skilled palpation and active trigger point release Trigger Point Dry Needling Initial Treatment: Pt instructed on Dry Needling rational, procedures, and possible side effects. Pt instructed to expect mild to moderate muscle soreness later in the day and/or into the next day.  Pt instructed in methods to reduce muscle soreness. Pt instructed to  continue prescribed HEP. Patient was educated on signs and  symptoms of infection and other risk factors and advised to seek medical attention should they occur.  Patient verbalized understanding of these instructions and education.  Patient Verbal Consent Given: Yes Education Handout Provided: Previously Provided Muscles treated: left glutes Treatment response/outcome: local twitch response      03/17/24:   Nustep L5 X 12 min UE/LE Seated IR and ER of Rt shoulder See updated MMT on chart There Activities:  Leg Press: 100# bil LE's  3x 10, single leg 56# 2 x 10 bil Stepping over 9 inch hurdles forward and sideways x 4 each direction over 6 hurdles Manual:  Percussion over bilateral lumbar paraspinals, left QL and left glutes Education:  We discussed DN for pt to consider or his next visit and handout issued to pt   03/13/24:   Nu step L5 X 12 min UE/LE Supine SKTC x 3 holding 20 sec Supine IR/ER  shoulder abd 90 deg, shoulder stretch x 3 holding 10-15  There Activities:  Leg Press: 112# bil LE's  3x 10, single leg 56# 2 x 10 bil Stepping over 9 inch hurdles placed front, started reciprocal pattern with UE support, then worked on on UE support with step over pattern one leg at at time, then progressed to reciprocal step over without UE support Step ups x 15 on 6 inches Rows blue X 20 Shoulder extensions blue X 20 Diagonal chops blue X 20 bilat      PATIENT EDUCATION:  Education details: HEP, POC Person educated: Patient Education method: Programmer, multimedia, Demonstration, Verbal cues, and Handouts Education comprehension: verbalized understanding, returned demonstration, and verbal cues required  HOME EXERCISE PROGRAM: Access Code: [email or web address] URL: [email or web address] Date: 02/29/2024 Prepared by: Pauletta Browns  Exercises - Standing Shoulder Row with Anchored Resistance  - 1 x daily - 7 x weekly - 3 sets - 10 reps - Supine Bridge  - 2 x daily - 7 x weekly - 2  sets - 10 reps - 5 seconds hold - Supine Shoulder External Rotation in 45 Degrees Abduction AAROM with Dowel  - 1 x daily - 7 x weekly - 3 sets - 10 reps - Supine Shoulder Flexion Extension AAROM with Dowel  - 2-3 x daily - 7 x weekly - 2 sets - 10 reps - Seated Small Alternating Straight Leg Lifts with Heel Touch  - 2 x daily - 7 x weekly - 15-20 reps - Recumbent Bike  - 2-3 x daily - 7 x weekly - Bridge with Hip Abduction and Resistance  - 1-2 x daily - 7 x weekly - 2 sets - 10 reps - Supine Shoulder External Rotation Stretch  - 2 x daily - 7 x weekly - 1 sets - 10-20 reps - 10 seconds hold - Supine Shoulder Internal Rotation  - 2 x daily - 7 x weekly - 1 sets - 10-20 reps - 10 seconds hold - Standing Scapular Retraction  - 5 x daily - 7 x weekly - 1 sets - 5 reps - 5 second hold  ASSESSMENT:  CLINICAL IMPRESSION: Pt still reporting pain in his low back and left knee with varies with certain activiies. Pt feels he has made progress with PT and feels like he is not back to his PLOF. Pt has improved in his Left knee flexion strength over the last few weeks as well as overall shoulder ROM and LE strength. I am requesting 1-2x/ week for up to 8 weeks for pt to meet all LTG's set.   OBJECTIVE IMPAIRMENTS:  difficulty walking, decreased ROM, decreased strength, increased edema, impaired flexibility, impaired UE functional use, postural dysfunction, and pain.   ACTIVITY LIMITATIONS: lifting, bending, standing, squatting, sleeping, stairs, transfers, reach over head, and hygiene/grooming  PARTICIPATION LIMITATIONS: cleaning, community activity, and yard work  PERSONAL FACTORS: 3+ comorbidities: see pertinent history  are also affecting patient's functional outcome.   REHAB POTENTIAL: Good  CLINICAL DECISION MAKING: Evolving/moderate complexity  EVALUATION COMPLEXITY: High   GOALS: Goals reviewed with patient? Yes  SHORT TERM GOALS: (target date for Short term goals are 3 weeks 12/25/2023)    1.  Patient will demonstrate independent use of home exercise program to maintain progress from in clinic treatments.  Goal status:MET 02/18/24  2. Pt to improve his 5 time sit to stand to </=  13  seconds with UE support.   Goal Status: MET 02/18/24   LONG TERM GOALS: (target dates for all long term goals are 8 weeks 05/21/24)   1. Patient will demonstrate/report pain at worst less than or equal to 2/10 to facilitate minimal limitation in daily activity secondary to pain symptoms.  Goal status: On Going 03/26/24 (can reach 5-6/10 at times)   2. Patient will demonstrate independent use of home exercise program to facilitate ability to maintain/progress functional gains from skilled physical therapy services.  Goal status: On Going 03/26/24   3. Patient will demonstrate FOTO outcome > or = 51 % to indicate reduced disability due to condition.  49.5% FOTO 02/18/24 Goal status: New   4.  Patient will be able to perform up and down 1 flight of stairs with reciprocal gait pattern with left knee pain and low back pain </= 2/10.   Goal status: On Going 03/26/24   5.  Patient will demonstrate improved Rt UE flexion to >/= 150 degrees in order to improve functional mobility.   Goal status: Met 02/29/2024   6.  Pt will improve Rt shoulder ER to >/= 60 deg in order to improve functional mobility.  Goal status: On Going 03/26/24      PLAN:  PT FREQUENCY: 1-2x/week  PT DURATION: 6 weeks  PLANNED INTERVENTIONS: Can include 47829- PT Re-evaluation, 97110-Therapeutic exercises, 97530- Therapeutic activity, 97112- Neuromuscular re-education, 97535- Self Care, 97140- Manual therapy, [phone]- Gait training, [phone]- Orthotic Fit/training, [phone]- Canalith repositioning, [id number]- Aquatic Therapy, 97014- Electrical stimulation (unattended), [id number]- Electrical stimulation (manual), [id number]- Vasopneumatic device, [id number]- Ultrasound, [id number]- Traction (mechanical), [id number]- Ionotophoresis 4mg /ml Dexamethasone, Patient/Family  education, Balance training, Stair training, Taping, Dry Needling, Joint mobilization, Joint manipulation, Spinal manipulation, Spinal mobilization, Scar mobilization, Vestibular training, Visual/preceptual remediation/compensation, DME instructions, Cryotherapy, and Moist heat.  All performed as medically necessary.  All included unless contraindicated  PLAN FOR NEXT SESSION:  Shoulder and back exercises, capsular stretching, body mechanics, low back and postural strengthening, try percussion or STM as needed  Re-certification: 03/26/24 for 8 weeks 1-2x/ week   Sharmon Leyden, PT, MPT 03/26/2024, 1:01 PM

## 2024-04-09 ENCOUNTER — Encounter: Payer: Self-pay | Admitting: Physical Therapy

## 2024-04-09 ENCOUNTER — Ambulatory Visit (INDEPENDENT_AMBULATORY_CARE_PROVIDER_SITE_OTHER): Admitting: Physical Therapy

## 2024-04-09 DIAGNOSIS — G8929 Other chronic pain: Secondary | ICD-10-CM

## 2024-04-09 DIAGNOSIS — M5459 Other low back pain: Secondary | ICD-10-CM

## 2024-04-09 DIAGNOSIS — M25511 Pain in right shoulder: Secondary | ICD-10-CM | POA: Diagnosis not present

## 2024-04-09 DIAGNOSIS — M25562 Pain in left knee: Secondary | ICD-10-CM | POA: Diagnosis not present

## 2024-04-09 DIAGNOSIS — M6281 Muscle weakness (generalized): Secondary | ICD-10-CM | POA: Diagnosis not present

## 2024-04-09 NOTE — Therapy (Signed)
 OUTPATIENT PHYSICAL THERAPY TREATMENT    Patient Name: Jose [name] MRN: [id number] DOB:05-05-1951, 73 y.o., male Today's Date: 04/09/2024  END OF SESSION:  PT End of Session - 04/09/24 1100     Visit Number 11    Number of Visits 22    Date for PT Re-Evaluation 05/21/24    Authorization Type Re-certification for 1-2x/ week for up to 8 weeks    PT Start Time 1100    PT Stop Time 1141    PT Time Calculation (min) 41 min    Activity Tolerance Patient tolerated treatment well;No increased pain    Behavior During Therapy WFL for tasks assessed/performed                     Past Medical History:  Diagnosis Date   Arthritis    R shoulder, great toes, hands- has gout & has injections in knees with Dr. Alvira Josephs    Bicuspid aortic valve with ascending aorta 4.0 to 4.5 cm in diameter    CAD (coronary artery disease)    Cancer (HCC)    skin ca-    GERD (gastroesophageal reflux disease)    Headache    History of hiatal hernia    Hypertension    Prostate cancer (HCC)    S/P TAVR (transcatheter aortic valve replacement)    SDH (subdural hematoma) (HCC)    Severe aortic stenosis    Thoracic ascending aortic aneurysm (HCC)    Vertebral artery aneurysm [facility])    Past Surgical History:  Procedure Laterality Date   BRAIN SURGERY  2016   evacuation of SDH   CARDIAC CATHETERIZATION     CARPAL TUNNEL RELEASE Bilateral    embolization of left vertebral artery aneurysm with pipeline/coil  10/31/2022   GREEN LIGHT LASER TURP (TRANSURETHRAL RESECTION OF PROSTATE  04/27/2017   KNEE ARTHROPLASTY     LEFT HEART CATH AND CORONARY ANGIOGRAPHY N/A 10/08/2019   Procedure: LEFT HEART CATH AND CORONARY ANGIOGRAPHY;  Surgeon: Arnoldo Lapping, MD;  Location: [facility] INVASIVE CV LAB;  Service: Cardiovascular;  Laterality: N/A;   QUADRICEPS TENDON REPAIR Bilateral 06/15/2017   Procedure: REPAIR QUADRICEP TENDON;  Surgeon: Jasmine Mesi, MD;  Location: [facility] OR;  Service: Orthopedics;   Laterality: Bilateral;   SHOULDER SURGERY Right    TEE WITHOUT CARDIOVERSION N/A 07/03/2022   Procedure: TRANSESOPHAGEAL ECHOCARDIOGRAM (TEE);  Surgeon: Loyde Rule, MD;  Location: [facility] ENDOSCOPY;  Service: Cardiovascular;  Laterality: N/A;   TRANSCATHETER AORTIC VALVE REPLACEMENT, TRANSFEMORAL  12/23/2019   TRANSCATHETER AORTIC VALVE REPLACEMENT, TRANSFEMORAL N/A 12/23/2019   Procedure: TRANSCATHETER AORTIC VALVE REPLACEMENT, TRANSFEMORAL;  Surgeon: Arnoldo Lapping, MD;  Location: [facility] OR;  Service: Open Heart Surgery;  Laterality: N/A;   VASCULAR SURGERY     Patient Active Problem List   Diagnosis Date Noted   Hypertension    Severe aortic stenosis 10/08/2019   Candidiasis    Abnormality of gait    Hypoalbuminemia due to protein-calorie malnutrition [facility])    History of gout    Sleep disturbance    Constipation    Rupture of quadriceps tendon, right, sequela 06/19/2017   Quadriceps tendon rupture, left, sequela 06/19/2017   Acute blood loss anemia 06/19/2017   Fall    History of subdural hematoma    Post-operative pain    Recurrent falls    Quadriceps tendon rupture 06/15/2017   Primary osteoarthritis of both knees 02/28/2017   Primary osteoarthritis of both hands 02/28/2017   Uricacidemia 02/28/2017   Pain  in joint of left knee 02/07/2017   Idiopathic chronic gout, unspecified site, without tophus (tophi) 11/29/2016   HEMATURIA UNSPECIFIED 01/25/2009   Abdominal pain 01/25/2009   XERODERMA 03/10/2008   UNS ADVRS EFF UNS RX MEDICINAL&BIOLOGICAL SBSTNC 03/10/2008   ADJUSTMENT REACTION WITH PHYSICAL SYMPTOMS 02/14/2008   MUSCLE SPASM 02/14/2008   ACUTE PROSTATITIS 12/16/2007   BREAST LUMP OR MASS, RIGHT 07/12/2007   H/O: RCT (rotator cuff tear) 01/13/2007   GOUT 01/08/2007    PCP: Elisabeth Guild, MD   REFERRING PROVIDER: Jasmine Mesi, MD   REFERRING DIAG:  [phone] (ICD-10-CM) - Synovitis of left knee  M19.011 (ICD-10-CM) - Primary osteoarthritis, right  shoulder  M54.50 (ICD-10-CM) - Low back pain, unspecified back pain laterality, unspecified chronicity, unspecified whether sciatica present    THERAPY DIAG:  Chronic right shoulder pain  Muscle weakness (generalized)  Chronic pain of left knee  Other low back pain  Rationale for Evaluation and Treatment: Rehabilitation  ONSET DATE: see subjective, on-going for years    PERTINENT HISTORY: HTN, severe aortic stenosis, abnormality of gait, h/o gout, rupture of quadriceps tendon on Rt 2018 on left 2018, subdural hematoma 2016, h/o falls, primary OA in bil hands Polio on Left side affecting Left UE strength  SUBJECTIVE:    SUBJECTIVE STATEMENT: The back pain is getting better but still bothers him.  He is using Swiss ball supine (rolling flex/ext, bridge & sit up / crunches).  His right dominant wrist has started hurting and negative effect on shoulder.   EVAL:  Pt stating he has been painting a lot at home and feels it may have exacerbated his Rt shoulder. Pt reporting radiation pain down his arm but no numbness/tingling. Pt stating Rt shoulder is giving him the most problem. Pt stating after getting the second shingles shot his shoulder pain increased into tricep and shoulder. Pt stating pain has been ongoing for about a year. Pt stating pain is worse at night.  Pt stating left knee pain has been ongoing since January 2018 when he was skiing and twisted his knee and he tore his quadriceps tendon. Pt stating he fell and when he fell he tore both tendons in bil LE. Pt then stating the repair didn't take on his left LE and it was repaired again. Pt stating he had PT and he gained ROM back in bil LE.  Pt reporting falling while in hospital during a bed transfer where he landed on his floor on his back in 2018. Pt stating his back has bothered him on/off ever since.  Pt stating he hasn't been able to work out over the past few months due to recent move into [place].   PAIN:  NPRS  scale:5/10 today, 9/10 worse pain Pain description: achy, throbbing Aggravating factors: resting, tylenol , voltaren  Relieving factors: Reaching, overhead function, spine flexion and slouched postures  PRECAUTIONS: None  WEIGHT BEARING RESTRICTIONS: No  FALLS:  Has patient fallen in last 6 months? No falls in last few years. Haven't fell since he tore his quad tendons and during a hospital transfer several years ago  LIVING ENVIRONMENT: Lives with: lives with their family and lives with an adult companion Lives in: House/apartment Stairs: Yes: Internal: 17 steps; on left going up and External: 2 steps; on left going up Has following equipment at home: None  OCCUPATION: retired  PLOF: Independent  PATIENT GOALS: Be able to work out at gym with less pain  Next MD visit:   OBJECTIVE:   DIAGNOSTIC FINDINGS: Shoulder:  08/06/23  3 views of the right shoulder including Grashey, scapular Y and axial view  were ordered and reviewed by myself.  X-rays show severe osteoarthritis of  the right glenohumeral joint.  There is a large inferior spur off the  humeral head.  There is some chondrocalcinosis superior to the humeral  head, likely indicative of pseudogout or prior gout.  No acute bony  fracture noted.  There is bony sclerosis noted of the humeral head and  inferior acetabulum from likely arthritic change.   Knee 05/22/23:   1. Postsurgical changes of quadriceps tendon repair. The tendon appears intact. Resolution of the prior small fluid bright signal within the midsubstance of the quadriceps tendon insertion on prior 10/01/2018 MRI. 2. Stable chronic oblique tear of the anterior horn of the lateral meniscus. Mild degenerative undersurface fraying of the posterior horn of the lateral meniscus. 3. Interval worsening of moderate to severe patellofemoral cartilage degenerative changes. 4. Mildly worsened cartilage loss within the anteromedial aspect of the weight-bearing lateral  femoral condyle and adjacent lateral tibial plateau. 5. Unchanged high-grade partial to full-thickness cartilage loss within the weight-bearing medial femoral condyle.  Lumbar: 05/22/23:  AP lateral radiographs lumbar spine reviewed.  Grade 1-2 spondylolisthesis present at L5-S1 with pars fracture noted at the corresponding level.   Mild to moderate degenerative changes present in the upper and and middle lumbar spine region.  Visualized hips without arthritis    PATIENT SURVEYS:  12/05/23: FOTO intake:  49%  predicted:  51% 12/31/23: FOTO update 48%  02/27/24: FOTO update 57%  COGNITION: Overall cognitive status: WFL    SENSATION: WFL   POSTURE:  rounded shoulders, forward head, and decreased lumbar lordosis  PALPATION: TTP: distal left quad tendon, Rt upper trap and levator and anterior shoulder  MMT   Right 12/04/23 Left 12/04/23 Rt  / Left 12/18/23 HHD ppsi Right 02/29/24 Rt 03/17/24 Rt 03/26/24 Lt 03/26/24  Hip flexion 5 5       Hip extension 5 5       Hip abduction 5 5       Hip adduction 5 5       Hip internal rotation         Hip external rotation         Knee flexion 5 5 52.5 / 40.0   64.1 51.0  Knee extension 5 5 54.4 / 65.5    52.1 64.3                  SHOULDER          Shoulder flexion 4 3       Shoulder abduction 4 3       Shoulder ER 4- 3  23.0 pounds 27.4 pounds 28.9 pounds   Shoulder IR 4- 3  33.4 pounds 39.4 pounds 39.6 pounds    (Blank rows = not tested)    ROM  LE Right 12/04/23 Left 12/04/23 Rt  01/22/24 Left 01/22/24 Active supine 02/18/24 Rt  Active standing 02/29/2024 Right  Hip flexion 108 110 112 112 140    Hip extension        Hip abduction        Hip adduction        Hip internal rotation        Hip external rotation        Knee flexion 134 126      Knee extension 0 0       SHOULDER:  Shoulder flexion 138 158 145 155 155 150  Shoulder abduction 125 140   130   Shoulder ER 30 60   52 50  Shoulder IR      20   Shoulder horizontal adduction      35   (Blank rows = not tested)    FUNCTIONAL TESTS:   12/04/23 : 5 time sit to stand: 17.2 seconds no UE support 01/22/24: 5 time sit to stand: 9.2 seconds no UE support 02/18/24:  10.1 seconds no UE support  GAIT: Distance walked: clinic distances, level surface Assistive device utilized: None Level of assistance: Complete Independence Comments: mild forward flexed trunk posture                                                                                                                                                                        TODAY'S TREATMENT                                                                           DATE:   04/09/2024:   Nustep level 7 seat 9 with BLEs/BUEs 10 min.    Sitting on green Swiss ball:  10 reps ant/post roll using ant/post pelvic tilt 10 reps side to side roll using pelvic 15 reps blue theraband BUE rows 10 reps circles CW & CCW using pelvis Shoulder extension blue theraband - alternating UEs 10 reps & BUEs 10 reps Alternating stepping LE out / back knee ext / flexion 10 reps  Alternating stepping out to side and back in - abd / add 10 reps Sitting upright BUEs (elbows bent at 90*) horizontal abd /add Scapular depression reaching single UE down & back for 10 reps ea.  Marching 10 reps Marching with contralateral UE touching ant knee for counter trunk rotation 10 reps Blue theraband IR & ER with ea UE 10 reps ea. PT & pt discussed safe set-up for home in corner with chairs.  Doing exercises even without theraband for shoulders. Include both back & shoulder exercises with upright posture.  Avoid fatigue especially with history of Polio.  Pt verbalized understanding.     TREATMENT  DATE:   03/26/24:   Nustep L6 X 10 min UE/LE Seated IR and ER of Rt shoulder isometrics Knee flexion/extension MMT and updated ROM measurements see  above There Activities:  Leg Press: 112# bil LE's  3x 10, single leg 56# 2 x 15 bil Sit to stand: x 10  NMR:  Tandem walking in parallel bars x 3 with no UE support on level surface Single leg stance with toe touch on opposite LE x 5 holding 30 sec bil LE c intermittent UE support as needed Standing on BOSU ball x 3 minutes with intermittent UE support working on upright postural control Manual:  Percussion to lumbar paraspinals, QL, and glutes x 8 minutes    03/19/24:   Nustep L6 X 10 min UE/LE Seated IR and ER of Rt shoulder Standing door stretch 90 deg holding 20 sec x 3 bil UE Seated lumbar stretch over green physio ball x 5  There Activities:  Leg Press: 106# bil LE's  3x 10, single leg 56# 2 x 15 bil Stepping over 9 inch hurdles forward and sideways x 10 each direction over 6 hurdles c weight shifting Manual:  Skilled palpation and active trigger point release Trigger Point Dry Needling Initial Treatment: Pt instructed on Dry Needling rational, procedures, and possible side effects. Pt instructed to expect mild to moderate muscle soreness later in the day and/or into the next day.  Pt instructed in methods to reduce muscle soreness. Pt instructed to continue prescribed HEP. Patient was educated on signs and symptoms of infection and other risk factors and advised to seek medical attention should they occur.  Patient verbalized understanding of these instructions and education.  Patient Verbal Consent Given: Yes Education Handout Provided: Previously Provided Muscles treated: left glutes Treatment response/outcome: local twitch response      03/17/24:   Nustep L5 X 12 min UE/LE Seated IR and ER of Rt shoulder See updated MMT on chart There Activities:  Leg Press: 100# bil LE's  3x 10, single leg 56# 2 x 10 bil Stepping over 9 inch hurdles forward and sideways x 4 each direction over 6 hurdles Manual:  Percussion over bilateral lumbar paraspinals, left QL and left  glutes Education:  We discussed DN for pt to consider or his next visit and handout issued to pt     PATIENT EDUCATION:  Education details: HEP, POC Person educated: Patient Education method: Programmer, multimedia, Demonstration, Verbal cues, and Handouts Education comprehension: verbalized understanding, returned demonstration, and verbal cues required  HOME EXERCISE PROGRAM: Access Code: [email or web address] URL: [email or web address] Date: 02/29/2024 Prepared by: Terral Ferrari  Exercises - Standing Shoulder Row with Anchored Resistance  - 1 x daily - 7 x weekly - 3 sets - 10 reps - Supine Bridge  - 2 x daily - 7 x weekly - 2 sets - 10 reps - 5 seconds hold - Supine Shoulder External Rotation in 45 Degrees Abduction AAROM with Dowel  - 1 x daily - 7 x weekly - 3 sets - 10 reps - Supine Shoulder Flexion Extension AAROM with Dowel  - 2-3 x daily - 7 x weekly - 2 sets - 10 reps - Seated Small Alternating Straight Leg Lifts with Heel Touch  - 2 x daily - 7 x weekly - 15-20 reps - Recumbent Bike  - 2-3 x daily - 7 x weekly - Bridge with Hip Abduction and Resistance  - 1-2 x daily - 7 x weekly - 2 sets - 10 reps - Supine Shoulder External  Rotation Stretch  - 2 x daily - 7 x weekly - 1 sets - 10-20 reps - 10 seconds hold - Supine Shoulder Internal Rotation  - 2 x daily - 7 x weekly - 1 sets - 10-20 reps - 10 seconds hold - Standing Scapular Retraction  - 5 x daily - 7 x weekly - 1 sets - 5 reps - 5 second hold  ASSESSMENT:  CLINICAL IMPRESSION: Patient's gait appeared to be more fluent exiting PT than it did when he arrived.  Performing functional activities sitting on a Swiss ball to work on core stability, back flexibility and shoulder strengthening appeared to be beneficial to this patient.  OBJECTIVE IMPAIRMENTS: difficulty walking, decreased ROM, decreased strength, increased edema, impaired flexibility, impaired UE functional use, postural dysfunction, and pain.   ACTIVITY LIMITATIONS:  lifting, bending, standing, squatting, sleeping, stairs, transfers, reach over head, and hygiene/grooming  PARTICIPATION LIMITATIONS: cleaning, community activity, and yard work  PERSONAL FACTORS: 3+ comorbidities: see pertinent history  are also affecting patient's functional outcome.   REHAB POTENTIAL: Good  CLINICAL DECISION MAKING: Evolving/moderate complexity  EVALUATION COMPLEXITY: High   GOALS: Goals reviewed with patient? Yes  SHORT TERM GOALS: (target date for Short term goals are 3 weeks 12/25/2023)   1.  Patient will demonstrate independent use of home exercise program to maintain progress from in clinic treatments.  Goal status:MET 02/18/24  2. Pt to improve his 5 time sit to stand to </=  13  seconds with UE support.   Goal Status: MET 02/18/24   LONG TERM GOALS: (target dates for all long term goals are 8 weeks 05/21/24)   1. Patient will demonstrate/report pain at worst less than or equal to 2/10 to facilitate minimal limitation in daily activity secondary to pain symptoms.  Goal status: On Going 04/09/24 (can reach 5-6/10 at times)   2. Patient will demonstrate independent use of home exercise program to facilitate ability to maintain/progress functional gains from skilled physical therapy services.  Goal status: On Going 04/09/24   3. Patient will demonstrate FOTO outcome > or = 51 % to indicate reduced disability due to condition.  49.5% FOTO 02/18/24 Goal status: On Going 04/09/24   4.  Patient will be able to perform up and down 1 flight of stairs with reciprocal gait pattern with left knee pain and low back pain </= 2/10.   Goal status: On Going 04/09/24   5.  Patient will demonstrate improved Rt UE flexion to >/= 150 degrees in order to improve functional mobility.   Goal status: Met 02/29/2024   6.  Pt will improve Rt shoulder ER to >/= 60 deg in order to improve functional mobility.  Goal status: On Going 04/09/24      PLAN:  PT FREQUENCY:  1-2x/week  PT DURATION: 6 weeks  PLANNED INTERVENTIONS: Can include 16109- PT Re-evaluation, 97110-Therapeutic exercises, 97530- Therapeutic activity, 97112- Neuromuscular re-education, 97535- Self Care, 97140- Manual therapy, [phone]- Gait training, [phone]- Orthotic Fit/training, [phone]- Canalith repositioning, [id number]- Aquatic Therapy, 97014- Electrical stimulation (unattended), [id number]- Electrical stimulation (manual), [id number]- Vasopneumatic device, [id number]- Ultrasound, [id number]- Traction (mechanical), [id number]- Ionotophoresis 4mg /ml Dexamethasone , Patient/Family education, Balance training, Stair training, Taping, Dry Needling, Joint mobilization, Joint manipulation, Spinal manipulation, Spinal mobilization, Scar mobilization, Vestibular training, Visual/preceptual remediation/compensation, DME instructions, Cryotherapy, and Moist heat.  All performed as medically necessary.  All included unless contraindicated  PLAN FOR NEXT SESSION:    Continue with shoulder and back exercises, capsular stretching, body mechanics, low back and postural strengthening, try  percussion or STM as needed  Re-certification: 03/26/24 for 8 weeks 1-2x/ week   Lorie Rook, PT, DPT 04/09/2024, 3:58 PM

## 2024-04-10 ENCOUNTER — Ambulatory Visit: Attending: Rheumatology | Admitting: Rheumatology

## 2024-04-10 ENCOUNTER — Encounter: Payer: Self-pay | Admitting: Rheumatology

## 2024-04-10 VITALS — BP 107/66 | HR 66 | Resp 16 | Ht 72.0 in | Wt 201.4 lb

## 2024-04-10 DIAGNOSIS — M65341 Trigger finger, right ring finger: Secondary | ICD-10-CM | POA: Insufficient documentation

## 2024-04-10 DIAGNOSIS — S065XAA Traumatic subdural hemorrhage with loss of consciousness status unknown, initial encounter: Secondary | ICD-10-CM | POA: Diagnosis present

## 2024-04-10 DIAGNOSIS — M19072 Primary osteoarthritis, left ankle and foot: Secondary | ICD-10-CM | POA: Insufficient documentation

## 2024-04-10 DIAGNOSIS — G629 Polyneuropathy, unspecified: Secondary | ICD-10-CM | POA: Diagnosis present

## 2024-04-10 DIAGNOSIS — S76112S Strain of left quadriceps muscle, fascia and tendon, sequela: Secondary | ICD-10-CM | POA: Diagnosis present

## 2024-04-10 DIAGNOSIS — Z5181 Encounter for therapeutic drug level monitoring: Secondary | ICD-10-CM | POA: Diagnosis present

## 2024-04-10 DIAGNOSIS — M25512 Pain in left shoulder: Secondary | ICD-10-CM | POA: Insufficient documentation

## 2024-04-10 DIAGNOSIS — M170 Bilateral primary osteoarthritis of knee: Secondary | ICD-10-CM | POA: Insufficient documentation

## 2024-04-10 DIAGNOSIS — M25511 Pain in right shoulder: Secondary | ICD-10-CM | POA: Diagnosis present

## 2024-04-10 DIAGNOSIS — A809 Acute poliomyelitis, unspecified: Secondary | ICD-10-CM | POA: Insufficient documentation

## 2024-04-10 DIAGNOSIS — G8929 Other chronic pain: Secondary | ICD-10-CM | POA: Diagnosis present

## 2024-04-10 DIAGNOSIS — M19041 Primary osteoarthritis, right hand: Secondary | ICD-10-CM | POA: Insufficient documentation

## 2024-04-10 DIAGNOSIS — M1A09X0 Idiopathic chronic gout, multiple sites, without tophus (tophi): Secondary | ICD-10-CM | POA: Insufficient documentation

## 2024-04-10 DIAGNOSIS — M19071 Primary osteoarthritis, right ankle and foot: Secondary | ICD-10-CM | POA: Diagnosis present

## 2024-04-10 DIAGNOSIS — M65351 Trigger finger, right little finger: Secondary | ICD-10-CM | POA: Insufficient documentation

## 2024-04-10 DIAGNOSIS — M19042 Primary osteoarthritis, left hand: Secondary | ICD-10-CM | POA: Diagnosis present

## 2024-04-10 DIAGNOSIS — Z952 Presence of prosthetic heart valve: Secondary | ICD-10-CM | POA: Diagnosis present

## 2024-04-10 DIAGNOSIS — M25531 Pain in right wrist: Secondary | ICD-10-CM | POA: Diagnosis present

## 2024-04-10 MED ORDER — TRIAMCINOLONE ACETONIDE 40 MG/ML IJ SUSP
20.0000 mg | INTRAMUSCULAR | Status: AC | PRN
  Administered 2024-04-10: 20 mg via INTRA_ARTICULAR

## 2024-04-10 MED ORDER — LIDOCAINE HCL 1 % IJ SOLN
0.5000 mL | INTRAMUSCULAR | Status: AC | PRN
  Administered 2024-04-10: .5 mL

## 2024-04-10 NOTE — Progress Notes (Signed)
 Office Visit Note  Patient: Jose [name]             Date of Birth: Oct 21, 1951           MRN: [id number]             PCP: Elisabeth Guild, MD Referring: Elisabeth Guild, MD Visit Date: 04/10/2024 Occupation: @GUAROCC @  Subjective:  Right wrist pain  History of Present Illness: Jose [name] is a 73 y.o. male with gout and osteoarthritis.  He was here on March 21, 2024 for right wrist joint pain and swelling.  He had a cortisone injection to the right wrist and the right intercarpal joint.  He states the symptoms improved for the 3 to 4 days and then the symptoms recurred.  He complains of having increased pain and swelling in his right thumb base now.  He states he has been gardening a lot.  He was accompanied by Jolly Needle today who explains that she has been icing and taking Tylenol  without much relief.    Activities of Daily Living:  Patient reports morning stiffness for 15 minutes.   Patient Reports nocturnal pain.  Difficulty dressing/grooming: Denies Difficulty climbing stairs: Reports Difficulty getting out of chair: Denies Difficulty using hands for taps, buttons, cutlery, and/or writing: Denies  Review of Systems  Constitutional:  Negative for fatigue.  HENT:  Negative for mouth sores and mouth dryness.   Eyes:  Negative for dryness.  Respiratory:  Negative for shortness of breath.   Cardiovascular:  Negative for chest pain and palpitations.  Gastrointestinal:  Negative for blood in stool, constipation and diarrhea.  Endocrine: Negative for increased urination.  Genitourinary:  Negative for involuntary urination.  Musculoskeletal:  Positive for joint pain, joint pain, joint swelling, myalgias, morning stiffness and myalgias. Negative for gait problem, muscle weakness and muscle tenderness.  Skin:  Negative for color change, rash, hair loss and sensitivity to sunlight.  Allergic/Immunologic: Negative for susceptible to infections.  Neurological:  Negative for dizziness  and headaches.  Hematological:  Negative for swollen glands.  Psychiatric/Behavioral:  Positive for sleep disturbance. Negative for depressed mood. The patient is not nervous/anxious.     PMFS History:  Patient Active Problem List   Diagnosis Date Noted   Hypertension    Severe aortic stenosis 10/08/2019   Candidiasis    Abnormality of gait    Hypoalbuminemia due to protein-calorie malnutrition [facility])    History of gout    Sleep disturbance    Constipation    Rupture of quadriceps tendon, right, sequela 06/19/2017   Quadriceps tendon rupture, left, sequela 06/19/2017   Acute blood loss anemia 06/19/2017   Fall    History of subdural hematoma    Post-operative pain    Recurrent falls    Quadriceps tendon rupture 06/15/2017   Primary osteoarthritis of both knees 02/28/2017   Primary osteoarthritis of both hands 02/28/2017   Uricacidemia 02/28/2017   Pain in joint of left knee 02/07/2017   Idiopathic chronic gout, unspecified site, without tophus (tophi) 11/29/2016   HEMATURIA UNSPECIFIED 01/25/2009   Abdominal pain 01/25/2009   XERODERMA 03/10/2008   UNS ADVRS EFF UNS RX MEDICINAL&BIOLOGICAL SBSTNC 03/10/2008   ADJUSTMENT REACTION WITH PHYSICAL SYMPTOMS 02/14/2008   MUSCLE SPASM 02/14/2008   ACUTE PROSTATITIS 12/16/2007   BREAST LUMP OR MASS, RIGHT 07/12/2007   H/O: RCT (rotator cuff tear) 01/13/2007   GOUT 01/08/2007    Past Medical History:  Diagnosis Date   Arthritis    R shoulder, great toes,  hands- has gout & has injections in knees with Dr. Alvira Josephs    Bicuspid aortic valve with ascending aorta 4.0 to 4.5 cm in diameter    CAD (coronary artery disease)    Cancer (HCC)    skin ca-    GERD (gastroesophageal reflux disease)    Headache    History of hiatal hernia    Hypertension    Prostate cancer (HCC)    S/P TAVR (transcatheter aortic valve replacement)    SDH (subdural hematoma) (HCC)    Severe aortic stenosis    Thoracic ascending aortic aneurysm (HCC)     Vertebral artery aneurysm (HCC)     Family History  Problem Relation Age of Onset   Parkinson's disease Mother    Heart disease Father    Parkinson's disease Brother    Diabetes Brother    Past Surgical History:  Procedure Laterality Date   BRAIN SURGERY  2016   evacuation of SDH   CARDIAC CATHETERIZATION     CARPAL TUNNEL RELEASE Bilateral    embolization of left vertebral artery aneurysm with pipeline/coil  10/31/2022   GREEN LIGHT LASER TURP (TRANSURETHRAL RESECTION OF PROSTATE  04/27/2017   KNEE ARTHROPLASTY     LEFT HEART CATH AND CORONARY ANGIOGRAPHY N/A 10/08/2019   Procedure: LEFT HEART CATH AND CORONARY ANGIOGRAPHY;  Surgeon: Arnoldo Lapping, MD;  Location: [facility] INVASIVE CV LAB;  Service: Cardiovascular;  Laterality: N/A;   QUADRICEPS TENDON REPAIR Bilateral 06/15/2017   Procedure: REPAIR QUADRICEP TENDON;  Surgeon: Jasmine Mesi, MD;  Location: [facility] OR;  Service: Orthopedics;  Laterality: Bilateral;   SHOULDER SURGERY Right    TEE WITHOUT CARDIOVERSION N/A 07/03/2022   Procedure: TRANSESOPHAGEAL ECHOCARDIOGRAM (TEE);  Surgeon: Loyde Rule, MD;  Location: [facility] ENDOSCOPY;  Service: Cardiovascular;  Laterality: N/A;   TRANSCATHETER AORTIC VALVE REPLACEMENT, TRANSFEMORAL  12/23/2019   TRANSCATHETER AORTIC VALVE REPLACEMENT, TRANSFEMORAL N/A 12/23/2019   Procedure: TRANSCATHETER AORTIC VALVE REPLACEMENT, TRANSFEMORAL;  Surgeon: Arnoldo Lapping, MD;  Location: [facility] OR;  Service: Open Heart Surgery;  Laterality: N/A;   VASCULAR SURGERY     Social History   Social History Narrative   Not on file   Immunization History  Administered Date(s) Administered   PFIZER(Purple Top)SARS-COV-2 Vaccination 02/19/2020, 03/17/2020, 10/25/2020, 08/30/2021   Pfizer Fall 2024 Covid-19 Vaccine 67yrs thru 48yrs. 10/04/2022     Objective: Vital Signs: BP 107/66 (BP Location: Left Arm, Patient Position: Sitting, Cuff Size: Normal)   Pulse 66   Resp 16   Ht 6' (1.829 m)   Wt 201 lb 6.4 oz  (91.4 kg)   BMI 27.31 kg/m    Physical Exam Vitals and nursing note reviewed.  Constitutional:      Appearance: He is well-developed.  HENT:     Head: Normocephalic and atraumatic.  Eyes:     Conjunctiva/sclera: Conjunctivae normal.     Pupils: Pupils are equal, round, and reactive to light.  Cardiovascular:     Rate and Rhythm: Normal rate and regular rhythm.     Heart sounds: Normal heart sounds.  Pulmonary:     Effort: Pulmonary effort is normal.     Breath sounds: Normal breath sounds.  Abdominal:     General: Bowel sounds are normal.     Palpations: Abdomen is soft.  Musculoskeletal:     Cervical back: Normal range of motion and neck supple.  Skin:    General: Skin is warm and dry.     Capillary Refill: Capillary refill takes less than 2 seconds.  Neurological:     Mental Status: He is alert and oriented to person, place, and time.  Psychiatric:        Behavior: Behavior normal.      Musculoskeletal Exam: He had good range of motion of the cervical spine.  He had shoulder joint abduction limited to about 30 to 40 degrees bilaterally.  Elbow joints with good range of motion.  Wrist joints are in good range of motion without  warmth or swelling.  He had tenderness at the base of right CMC joint.  Bilateral PIP and DIP thickening was noted.  Hip joints and knee joints in good range of motion.  There was no tenderness over ankles or MTPs.  CDAI Exam: CDAI Score: -- Patient Global: --; Provider Global: -- Swollen: --; Tender: -- Joint Exam 04/10/2024   No joint exam has been documented for this visit   There is currently no information documented on the homunculus. Go to the Rheumatology activity and complete the homunculus joint exam.  Investigation: No additional findings.  Imaging: US  Guided Needle Placement Result Date: 03/21/2024 Ultrasound guided injection is preferred based studies that show increased duration, increased effect, greater accuracy, decreased  procedural pain, increased response rate, and decreased cost with ultrasound guided versus blind injection.   Verbal informed consent obtained.  Time-out conducted.  Noted no overlying erythema, induration, or other signs of local infection. Ultrasound-guided intercarpal joint injection: After sterile prep with Betadine , injected 1 mL of 1% lidocaine  and 30 mg Kenalog  using a 27-gauge needle, and the intercarpal joint.    XR Wrist Complete Right Result Date: 03/21/2024 Severe CMC narrowing was noted.  No intercarpal or radiocarpal joint space narrowing was noted.  No acute pathology was noted. Impression: Unremarkable x-rays of the wrist except for CMC arthritis.   Recent Labs: Lab Results  Component Value Date   WBC 7.7 12/27/2023   HGB 14.8 12/27/2023   PLT 156 12/27/2023   NA 142 12/27/2023   K 4.5 12/27/2023   CL 105 12/27/2023   CO2 27 12/27/2023   GLUCOSE 84 12/27/2023   BUN 21 12/27/2023   CREATININE 1.21 12/27/2023   BILITOT 0.5 12/27/2023   ALKPHOS 72 09/11/2023   AST 19 12/27/2023   ALT 17 12/27/2023   PROT 6.7 12/27/2023   ALBUMIN 4.2 09/11/2023   CALCIUM  9.9 12/27/2023   GFRAA 70 05/05/2021    Speciality Comments: No specialty comments available.  Procedures:  Small Joint Inj: R thumb CMC on 04/10/2024 2:44 PM Indications: pain Details: 27 G needle, radial approach  Spinal Needle: No  Medications: 20 mg triamcinolone  acetonide 40 MG/ML; 0.5 mL lidocaine  1 % Aspirate: 0 mL Outcome: tolerated well, no immediate complications Procedure, treatment alternatives, risks and benefits explained, specific risks discussed. Consent was given by the patient. Immediately prior to procedure a time out was called to verify the correct patient, procedure, equipment, support staff and site/side marked as required. Patient was prepped and draped in the usual sterile fashion.     Allergies: Ciprofloxacin and Quinolones   Assessment / Plan:     Visit Diagnoses: Idiopathic chronic  gout of multiple sites without tophus - uric acid: 3.5 on 12/27/2023.  Patient denies having a gout flare.  Medication monitoring encounter - Allopurinol  300 mg p.o. daily.  Chronic right shoulder pain - He had right glenohumeral joint injection by Dr. Shauna Del on August 06, 2023 for severe osteoarthritis.  Chronic left shoulder pain-he is going to physical therapy for bilateral shoulder joint  pain.  Primary osteoarthritis of both hands-he had bilateral CMC, PIP and DIP thickening.  He has been having increased pain and discomfort in his right CMC joint.  He had recent right wrist joint injection which was helpful.  No swelling was noted on the examination.  Patient does gardening for several hours a day which exacerbates her symptoms.  Patient describes severe pain and discomfort.  After side effects (risk of infection, discoloration, dermal atrophy, tendon and nerve injury) were discussed we decided to proceed with a right CMC injection.  The procedure was performed as described above.  Patient tolerated the procedure well.  Postprocedure instructions were given.  A prescription for right CMC brace was given.  I also discussed that if he has ongoing symptoms then he may require [facility] surgery in the future.  Pain in right wrist - s/p injection on 03/21/2024.  He had relief in the right wrist joint.  Trigger finger, right ring finger - He had 2 injections and A1 pulley and 1 injection in A3 pulley. right ring finger A1 pulley region was injected 12/23.  Resolved.  Trigger little finger of right hand - He had 2 injections and A1 pulley and 1 injection in A3 pulley. right ring finger A1 pulley region was injected 12/23.  Primary osteoarthritis of both knees - Left knee joint injected by Dr. Rozelle Corning in July 2024.  Patient had no recurrence of pain since the cortisone injection.  Quadriceps tendon rupture, left, sequela  Primary osteoarthritis of both feet  Neuropathy  Subdural hematoma (HCC)  H/O  aortic valve replacement  [place]  Orders: Orders Placed This Encounter  Procedures   Small Joint Inj   No orders of the defined types were placed in this encounter.    Follow-Up Instructions: Return for Osteoarthritis.   Nicholas Bari, MD  Note - This record has been created using Animal nutritionist.  Chart creation errors have been sought, but may not always  have been located. Such creation errors do not reflect on  the standard of medical care.

## 2024-04-10 NOTE — Patient Instructions (Signed)

## 2024-04-15 ENCOUNTER — Ambulatory Visit (INDEPENDENT_AMBULATORY_CARE_PROVIDER_SITE_OTHER): Admitting: Physical Therapy

## 2024-04-15 ENCOUNTER — Encounter: Payer: Self-pay | Admitting: Physical Therapy

## 2024-04-15 DIAGNOSIS — G8929 Other chronic pain: Secondary | ICD-10-CM

## 2024-04-15 DIAGNOSIS — M25511 Pain in right shoulder: Secondary | ICD-10-CM | POA: Diagnosis not present

## 2024-04-15 DIAGNOSIS — M25562 Pain in left knee: Secondary | ICD-10-CM

## 2024-04-15 DIAGNOSIS — M6281 Muscle weakness (generalized): Secondary | ICD-10-CM

## 2024-04-15 DIAGNOSIS — M5459 Other low back pain: Secondary | ICD-10-CM

## 2024-04-15 NOTE — Therapy (Signed)
 OUTPATIENT PHYSICAL THERAPY TREATMENT    Patient Name: Jose [name] MRN: [id number] DOB:[birth year], 73 y.o., male Today's Date: 04/15/2024  END OF SESSION:  PT End of Session - 04/15/24 1218     Visit Number 12    Number of Visits 22    Date for PT Re-Evaluation 05/21/24    Authorization Type Re-certification for 1-2x/ week for up to 8 weeks    PT Start Time 0930    PT Stop Time 1015    PT Time Calculation (min) 45 min    Activity Tolerance Patient tolerated treatment well;No increased pain    Behavior During Therapy WFL for tasks assessed/performed                      Past Medical History:  Diagnosis Date   Arthritis    R shoulder, great toes, hands- has gout & has injections in knees with Dr. Alvira Josephs    Bicuspid aortic valve with ascending aorta 4.0 to 4.5 cm in diameter    CAD (coronary artery disease)    Cancer (HCC)    skin ca-    GERD (gastroesophageal reflux disease)    Headache    History of hiatal hernia    Hypertension    Prostate cancer (HCC)    S/P TAVR (transcatheter aortic valve replacement)    SDH (subdural hematoma) (HCC)    Severe aortic stenosis    Thoracic ascending aortic aneurysm (HCC)    Vertebral artery aneurysm [facility])    Past Surgical History:  Procedure Laterality Date   BRAIN SURGERY  2016   evacuation of SDH   CARDIAC CATHETERIZATION     CARPAL TUNNEL RELEASE Bilateral    embolization of left vertebral artery aneurysm with pipeline/coil  10/31/2022   GREEN LIGHT LASER TURP (TRANSURETHRAL RESECTION OF PROSTATE  04/27/2017   KNEE ARTHROPLASTY     LEFT HEART CATH AND CORONARY ANGIOGRAPHY N/A 10/08/2019   Procedure: LEFT HEART CATH AND CORONARY ANGIOGRAPHY;  Surgeon: Arnoldo Lapping, MD;  Location: [facility] INVASIVE CV LAB;  Service: Cardiovascular;  Laterality: N/A;   QUADRICEPS TENDON REPAIR Bilateral 06/15/2017   Procedure: REPAIR QUADRICEP TENDON;  Surgeon: Jasmine Mesi, MD;  Location: [facility] OR;  Service: Orthopedics;   Laterality: Bilateral;   SHOULDER SURGERY Right    TEE WITHOUT CARDIOVERSION N/A 07/03/2022   Procedure: TRANSESOPHAGEAL ECHOCARDIOGRAM (TEE);  Surgeon: Loyde Rule, MD;  Location: [facility] ENDOSCOPY;  Service: Cardiovascular;  Laterality: N/A;   TRANSCATHETER AORTIC VALVE REPLACEMENT, TRANSFEMORAL  12/23/2019   TRANSCATHETER AORTIC VALVE REPLACEMENT, TRANSFEMORAL N/A 12/23/2019   Procedure: TRANSCATHETER AORTIC VALVE REPLACEMENT, TRANSFEMORAL;  Surgeon: Arnoldo Lapping, MD;  Location: [facility] OR;  Service: Open Heart Surgery;  Laterality: N/A;   VASCULAR SURGERY     Patient Active Problem List   Diagnosis Date Noted   Hypertension    Severe aortic stenosis 10/08/2019   Candidiasis    Abnormality of gait    Hypoalbuminemia due to protein-calorie malnutrition [facility])    History of gout    Sleep disturbance    Constipation    Rupture of quadriceps tendon, right, sequela 06/19/2017   Quadriceps tendon rupture, left, sequela 06/19/2017   Acute blood loss anemia 06/19/2017   Fall    History of subdural hematoma    Post-operative pain    Recurrent falls    Quadriceps tendon rupture 06/15/2017   Primary osteoarthritis of both knees 02/28/2017   Primary osteoarthritis of both hands 02/28/2017   Uricacidemia 02/28/2017  Pain in joint of left knee 02/07/2017   Idiopathic chronic gout, unspecified site, without tophus (tophi) 11/29/2016   HEMATURIA UNSPECIFIED 01/25/2009   Abdominal pain 01/25/2009   XERODERMA 03/10/2008   UNS ADVRS EFF UNS RX MEDICINAL&BIOLOGICAL SBSTNC 03/10/2008   ADJUSTMENT REACTION WITH PHYSICAL SYMPTOMS 02/14/2008   MUSCLE SPASM 02/14/2008   ACUTE PROSTATITIS 12/16/2007   BREAST LUMP OR MASS, RIGHT 07/12/2007   H/O: RCT (rotator cuff tear) 01/13/2007   GOUT 01/08/2007    PCP: Elisabeth Guild, MD   REFERRING PROVIDER: Jasmine Mesi, MD   REFERRING DIAG:  [id number] (ICD-10-CM) - Synovitis of left knee  M19.011 (ICD-10-CM) - Primary osteoarthritis, right  shoulder  M54.50 (ICD-10-CM) - Low back pain, unspecified back pain laterality, unspecified chronicity, unspecified whether sciatica present    THERAPY DIAG:  Chronic right shoulder pain  Muscle weakness (generalized)  Chronic pain of left knee  Other low back pain  Rationale for Evaluation and Treatment: Rehabilitation  ONSET DATE: see subjective, on-going for years    PERTINENT HISTORY: HTN, severe aortic stenosis, abnormality of gait, h/o gout, rupture of quadriceps tendon on Rt 2018 on left 2018, subdural hematoma 2016, h/o falls, primary OA in bil hands Polio on Left side affecting Left UE strength  SUBJECTIVE:    SUBJECTIVE STATEMENT: Pt reporting Rt wrist pain and thumb pain. Pt was instructed to call Dr. Alvira Josephs to see if he would prescribe OT. Pt currently wearing wrist brace.   EVAL:  Pt stating he has been painting a lot at home and feels it may have exacerbated his Rt shoulder. Pt reporting radiation pain down his arm but no numbness/tingling. Pt stating Rt shoulder is giving him the most problem. Pt stating after getting the second shingles shot his shoulder pain increased into tricep and shoulder. Pt stating pain has been ongoing for about a year. Pt stating pain is worse at night.  Pt stating left knee pain has been ongoing since January 2018 when he was skiing and twisted his knee and he tore his quadriceps tendon. Pt stating he fell and when he fell he tore both tendons in bil LE. Pt then stating the repair didn't take on his left LE and it was repaired again. Pt stating he had PT and he gained ROM back in bil LE.  Pt reporting falling while in hospital during a bed transfer where he landed on his floor on his back in 2018. Pt stating his back has bothered him on/off ever since.  Pt stating he hasn't been able to work out over the past few months due to recent move into [place].   PAIN:  NPRS scale:4-5/10 pain  Pain description: achy,  throbbing Aggravating factors: resting, tylenol , voltaren  Relieving factors: Reaching, overhead function, spine flexion and slouched postures  PRECAUTIONS: None  WEIGHT BEARING RESTRICTIONS: No  FALLS:  Has patient fallen in last 6 months? No falls in last few years. Haven't fell since he tore his quad tendons and during a hospital transfer several years ago  LIVING ENVIRONMENT: Lives with: lives with their family and lives with an adult companion Lives in: House/apartment Stairs: Yes: Internal: 17 steps; on left going up and External: 2 steps; on left going up Has following equipment at home: None  OCCUPATION: retired  PLOF: Independent  PATIENT GOALS: Be able to work out at gym with less pain  Next MD visit:   OBJECTIVE:   DIAGNOSTIC FINDINGS: Shoulder: 08/06/23  3 views of the right shoulder including Grashey, scapular Y  and axial view  were ordered and reviewed by myself.  X-rays show severe osteoarthritis of  the right glenohumeral joint.  There is a large inferior spur off the  humeral head.  There is some chondrocalcinosis superior to the humeral  head, likely indicative of pseudogout or prior gout.  No acute bony  fracture noted.  There is bony sclerosis noted of the humeral head and  inferior acetabulum from likely arthritic change.   Knee 05/22/23:   1. Postsurgical changes of quadriceps tendon repair. The tendon appears intact. Resolution of the prior small fluid bright signal within the midsubstance of the quadriceps tendon insertion on prior 10/01/2018 MRI. 2. Stable chronic oblique tear of the anterior horn of the lateral meniscus. Mild degenerative undersurface fraying of the posterior horn of the lateral meniscus. 3. Interval worsening of moderate to severe patellofemoral cartilage degenerative changes. 4. Mildly worsened cartilage loss within the anteromedial aspect of the weight-bearing lateral femoral condyle and adjacent lateral tibial plateau. 5.  Unchanged high-grade partial to full-thickness cartilage loss within the weight-bearing medial femoral condyle.  Lumbar: 05/22/23:  AP lateral radiographs lumbar spine reviewed.  Grade 1-2 spondylolisthesis present at L5-S1 with pars fracture noted at the corresponding level.   Mild to moderate degenerative changes present in the upper and and middle lumbar spine region.  Visualized hips without arthritis    PATIENT SURVEYS:  12/05/23: FOTO intake:  49%  predicted:  51% 12/31/23: FOTO update 48%  02/27/24: FOTO update 57%  COGNITION: Overall cognitive status: WFL    SENSATION: WFL   POSTURE:  rounded shoulders, forward head, and decreased lumbar lordosis  PALPATION: TTP: distal left quad tendon, Rt upper trap and levator and anterior shoulder  MMT   Right 12/04/23 Left 12/04/23 Rt  / Left 12/18/23 HHD ppsi Right 02/29/24 Rt 03/17/24 Rt 03/26/24 Lt 03/26/24  Hip flexion 5 5       Hip extension 5 5       Hip abduction 5 5       Hip adduction 5 5       Hip internal rotation         Hip external rotation         Knee flexion 5 5 52.5 / 40.0   64.1 51.0  Knee extension 5 5 54.4 / 65.5    52.1 64.3                  SHOULDER          Shoulder flexion 4 3       Shoulder abduction 4 3       Shoulder ER 4- 3  23.0 pounds 27.4 pounds 28.9 pounds   Shoulder IR 4- 3  33.4 pounds 39.4 pounds 39.6 pounds    (Blank rows = not tested)    ROM  LE Right 12/04/23 Left 12/04/23 Rt  01/22/24 Left 01/22/24 Active supine 02/18/24 Rt  Active standing 02/29/2024 Right  Hip flexion 108 110 112 112 140    Hip extension        Hip abduction        Hip adduction        Hip internal rotation        Hip external rotation        Knee flexion 134 126      Knee extension 0 0       SHOULDER:          Shoulder flexion 138 158 145 155 155  150  Shoulder abduction 125 140   130   Shoulder ER 30 60   52 50  Shoulder IR      20  Shoulder horizontal adduction      35   (Blank rows = not  tested)    FUNCTIONAL TESTS:   12/04/23 : 5 time sit to stand: 17.2 seconds no UE support 01/22/24: 5 time sit to stand: 9.2 seconds no UE support 02/18/24:  10.1 seconds no UE support  GAIT: Distance walked: clinic distances, level surface Assistive device utilized: None Level of assistance: Complete Independence Comments: mild forward flexed trunk posture                                                                                                                                                                        TODAY'S TREATMENT                                                                           DATE:   04/15/2024:   TherEx: Nustep level 7 seat 9 with BLEs/BUEs 10 min.   TherActivites:  Leg press: 112# 3 x 10 , single leg press 56# 2 x 10  NMR:  Sitting on green Swiss ball: (core activation and dynamic stability) 10 reps lateral pelvic shifting bil  Pelvic circles: x 10 bil directions Rows: 15 reps blue theraband Bil UE  Shoulder extension blue theraband - alternating UEs 10 reps & BUEs 10 reps Alternating stepping LE out / back knee ext / flexion 10 reps  Alternating stepping out to side and back in - abd / add 10 reps Marching 10 reps Marching with contralateral UE touching ant knee for counter trunk rotation 10 reps LAQ: x 10 bil LE      04/09/2024:   Nustep level 7 seat 9 with BLEs/BUEs 10 min.    Sitting on green Swiss ball:  10 reps ant/post roll using ant/post pelvic tilt 10 reps side to side roll using pelvic 15 reps blue theraband BUE rows 10 reps circles CW & CCW using pelvis Shoulder extension blue theraband - alternating UEs 10 reps & BUEs 10 reps Alternating stepping LE out / back knee ext / flexion 10 reps  Alternating stepping out to side and back in - abd / add 10 reps Sitting upright BUEs (elbows bent at 90*) horizontal abd /add Scapular depression reaching single UE down & back for 10 reps ea.  Marching 10 reps  Marching with  contralateral UE touching ant knee for counter trunk rotation 10 reps Blue theraband IR & ER with ea UE 10 reps ea. PT & pt discussed safe set-up for home in corner with chairs.  Doing exercises even without theraband for shoulders. Include both back & shoulder exercises with upright posture.  Avoid fatigue especially with history of Polio.  Pt verbalized understanding.     TREATMENT                                                                           DATE:   03/26/24:   Nustep L6 X 10 min UE/LE Seated IR and ER of Rt shoulder isometrics Knee flexion/extension MMT and updated ROM measurements see above There Activities:  Leg Press: 112# bil LE's  3x 10, single leg 56# 2 x 15 bil Sit to stand: x 10  NMR:  Tandem walking in parallel bars x 3 with no UE support on level surface Single leg stance with toe touch on opposite LE x 5 holding 30 sec bil LE c intermittent UE support as needed Standing on BOSU ball x 3 minutes with intermittent UE support working on upright postural control Manual:  Percussion to lumbar paraspinals, QL, and glutes x 8 minutes    03/19/24:   Nustep L6 X 10 min UE/LE Seated IR and ER of Rt shoulder Standing door stretch 90 deg holding 20 sec x 3 bil UE Seated lumbar stretch over green physio ball x 5  There Activities:  Leg Press: 106# bil LE's  3x 10, single leg 56# 2 x 15 bil Stepping over 9 inch hurdles forward and sideways x 10 each direction over 6 hurdles c weight shifting Manual:  Skilled palpation and active trigger point release Trigger Point Dry Needling Initial Treatment: Pt instructed on Dry Needling rational, procedures, and possible side effects. Pt instructed to expect mild to moderate muscle soreness later in the day and/or into the next day.  Pt instructed in methods to reduce muscle soreness. Pt instructed to continue prescribed HEP. Patient was educated on signs and symptoms of infection and other risk factors and advised to seek medical  attention should they occur.  Patient verbalized understanding of these instructions and education.  Patient Verbal Consent Given: Yes Education Handout Provided: Previously Provided Muscles treated: left glutes Treatment response/outcome: local twitch response      PATIENT EDUCATION:  Education details: HEP, POC Person educated: Patient Education method: Programmer, multimedia, Demonstration, Verbal cues, and Handouts Education comprehension: verbalized understanding, returned demonstration, and verbal cues required  HOME EXERCISE PROGRAM: Access Code: [email or web address] URL: [email or web address] Date: 04/15/2024 Prepared by: Jerrel Mor  Exercises - Standing Shoulder Row with Anchored Resistance  - 1 x daily - 7 x weekly - 3 sets - 10 reps - Supine Bridge  - 2 x daily - 7 x weekly - 2 sets - 10 reps - 5 seconds hold - Supine Shoulder External Rotation in 45 Degrees Abduction AAROM with Dowel  - 1 x daily - 7 x weekly - 3 sets - 10 reps - Supine Shoulder Flexion Extension AAROM with Dowel  - 2-3 x daily - 7 x weekly - 2 sets - 10 reps - Seated Small Alternating  Straight Leg Lifts with Heel Touch  - 2 x daily - 7 x weekly - 15-20 reps - Recumbent Bike  - 2-3 x daily - 7 x weekly - Bridge with Hip Abduction and Resistance  - 1-2 x daily - 7 x weekly - 2 sets - 10 reps - Supine Shoulder External Rotation Stretch  - 2 x daily - 7 x weekly - 1 sets - 10-20 reps - 10 seconds hold - Supine Shoulder Internal Rotation  - 2 x daily - 7 x weekly - 1 sets - 10-20 reps - 10 seconds hold - Standing Scapular Retraction  - 5 x daily - 7 x weekly - 1 sets - 5 reps - 5 second hold - Swiss Ball March  - 1 x daily - 7 x weekly - 2 sets - 10 reps - Seated Lateral Pelvic Tilt on Swiss Ball  - 1 x daily - 7 x weekly - 10 reps - Pelvic Circles on [employer]  - 1 x daily - 7 x weekly - 3 sets - 10 reps - Swiss Ball Knee Extension  - 1 x daily - 7 x weekly - 2 sets - 10 reps - Seated Shoulder Extension  and Scapular Retraction with Resistance on Swiss Ball  - 1 x daily - 7 x weekly - 2 sets - 10 reps - Seated Shoulder W External Rotation on Swiss Ball  - 1 x daily - 7 x weekly - 10 reps - Row with Anchored Resistance on [employer]  - 1 x daily - 7 x weekly - 2 sets - 10 reps  ASSESSMENT:  CLINICAL IMPRESSION: Pt's HEP was updated this visit with physio-ball exercises for home. Pt stating his ball was 55 centimeters and he was instructed to purchase a ball that was at least 65 centimeters to prevent too much hip flexion and posterior pelvic tilt while on the ball. Pt also recommending to call MD office and request OT referral for his Rt wrist. Pt tolerating all exercises well. Continue skilled PT.   OBJECTIVE IMPAIRMENTS: difficulty walking, decreased ROM, decreased strength, increased edema, impaired flexibility, impaired UE functional use, postural dysfunction, and pain.   ACTIVITY LIMITATIONS: lifting, bending, standing, squatting, sleeping, stairs, transfers, reach over head, and hygiene/grooming  PARTICIPATION LIMITATIONS: cleaning, community activity, and yard work  PERSONAL FACTORS: 3+ comorbidities: see pertinent history  are also affecting patient's functional outcome.   REHAB POTENTIAL: Good  CLINICAL DECISION MAKING: Evolving/moderate complexity  EVALUATION COMPLEXITY: High   GOALS: Goals reviewed with patient? Yes  SHORT TERM GOALS: (target date for Short term goals are 3 weeks 12/25/2023)   1.  Patient will demonstrate independent use of home exercise program to maintain progress from in clinic treatments.  Goal status:MET 02/18/24  2. Pt to improve his 5 time sit to stand to </=  13  seconds with UE support.   Goal Status: MET 02/18/24   LONG TERM GOALS: (target dates for all long term goals are 8 weeks 05/21/24)   1. Patient will demonstrate/report pain at worst less than or equal to 2/10 to facilitate minimal limitation in daily activity secondary to pain  symptoms.  Goal status: On Going 04/09/24 (can reach 5-6/10 at times)   2. Patient will demonstrate independent use of home exercise program to facilitate ability to maintain/progress functional gains from skilled physical therapy services.  Goal status: On Going 04/09/24   3. Patient will demonstrate FOTO outcome > or = 51 % to indicate reduced disability  due to condition.  49.5% FOTO 02/18/24 Goal status: On Going 04/09/24   4.  Patient will be able to perform up and down 1 flight of stairs with reciprocal gait pattern with left knee pain and low back pain </= 2/10.   Goal status: On Going 04/09/24   5.  Patient will demonstrate improved Rt UE flexion to >/= 150 degrees in order to improve functional mobility.   Goal status: Met 02/29/2024   6.  Pt will improve Rt shoulder ER to >/= 60 deg in order to improve functional mobility.  Goal status: On Going 04/09/24      PLAN:  PT FREQUENCY: 1-2x/week  PT DURATION: 6 weeks  PLANNED INTERVENTIONS: Can include 53664- PT Re-evaluation, 97110-Therapeutic exercises, 97530- Therapeutic activity, 97112- Neuromuscular re-education, 97535- Self Care, 97140- Manual therapy, [phone]- Gait training, [phone]- Orthotic Fit/training, [phone]- Canalith repositioning, [id number]- Aquatic Therapy, 97014- Electrical stimulation (unattended), [id number]- Electrical stimulation (manual), [id number]- Vasopneumatic device, [id number]- Ultrasound, [id number]- Traction (mechanical), [id number]- Ionotophoresis 4mg /ml Dexamethasone , Patient/Family education, Balance training, Stair training, Taping, Dry Needling, Joint mobilization, Joint manipulation, Spinal manipulation, Spinal mobilization, Scar mobilization, Vestibular training, Visual/preceptual remediation/compensation, DME instructions, Cryotherapy, and Moist heat.  All performed as medically necessary.  All included unless contraindicated  PLAN FOR NEXT SESSION:    Continue with shoulder and back exercises, capsular stretching, body mechanics, low  back and postural strengthening, try percussion or STM as needed  Re-certification: 03/26/24 for 8 weeks 1-2x/ week   Marysue Sola, PT, MPT 04/15/2024, 12:57 PM

## 2024-04-22 ENCOUNTER — Encounter: Payer: Self-pay | Admitting: Physical Therapy

## 2024-04-22 ENCOUNTER — Telehealth: Payer: Self-pay | Admitting: Physical Therapy

## 2024-04-22 ENCOUNTER — Ambulatory Visit (INDEPENDENT_AMBULATORY_CARE_PROVIDER_SITE_OTHER): Admitting: Physical Therapy

## 2024-04-22 DIAGNOSIS — M6281 Muscle weakness (generalized): Secondary | ICD-10-CM | POA: Diagnosis not present

## 2024-04-22 DIAGNOSIS — M25531 Pain in right wrist: Secondary | ICD-10-CM

## 2024-04-22 DIAGNOSIS — M25562 Pain in left knee: Secondary | ICD-10-CM

## 2024-04-22 DIAGNOSIS — M25511 Pain in right shoulder: Secondary | ICD-10-CM

## 2024-04-22 DIAGNOSIS — M25521 Pain in right elbow: Secondary | ICD-10-CM

## 2024-04-22 DIAGNOSIS — M5459 Other low back pain: Secondary | ICD-10-CM

## 2024-04-22 DIAGNOSIS — M6530 Trigger finger, unspecified finger: Secondary | ICD-10-CM

## 2024-04-22 DIAGNOSIS — G8929 Other chronic pain: Secondary | ICD-10-CM

## 2024-04-22 NOTE — Telephone Encounter (Signed)
 I am requesting an OT referral for pt's Rt wrist, Rt elbow and trigger finger.  Thanks,  Jerrel Mor, PT, MPT

## 2024-04-22 NOTE — Therapy (Signed)
 OUTPATIENT PHYSICAL THERAPY TREATMENT    Patient Name: Jose [name] MRN: [id number] DOB:Apr 26, 1951, 73 y.o., male Today's Date: 04/22/2024  END OF SESSION:  PT End of Session - 04/22/24 1147     Visit Number 13    Number of Visits 22    Date for PT Re-Evaluation 05/21/24    Authorization Type Re-certification for 1-2x/ week for up to 8 weeks    PT Start Time 1146    PT Stop Time 1228    PT Time Calculation (min) 42 min    Activity Tolerance Patient tolerated treatment well;No increased pain    Behavior During Therapy WFL for tasks assessed/performed                      Past Medical History:  Diagnosis Date   Arthritis    R shoulder, great toes, hands- has gout & has injections in knees with Dr. Alvira Josephs    Bicuspid aortic valve with ascending aorta 4.0 to 4.5 cm in diameter    CAD (coronary artery disease)    Cancer (HCC)    skin ca-    GERD (gastroesophageal reflux disease)    Headache    History of hiatal hernia    Hypertension    Prostate cancer (HCC)    S/P TAVR (transcatheter aortic valve replacement)    SDH (subdural hematoma) (HCC)    Severe aortic stenosis    Thoracic ascending aortic aneurysm (HCC)    Vertebral artery aneurysm [facility])    Past Surgical History:  Procedure Laterality Date   BRAIN SURGERY  2016   evacuation of SDH   CARDIAC CATHETERIZATION     CARPAL TUNNEL RELEASE Bilateral    embolization of left vertebral artery aneurysm with pipeline/coil  10/31/2022   GREEN LIGHT LASER TURP (TRANSURETHRAL RESECTION OF PROSTATE  04/27/2017   KNEE ARTHROPLASTY     LEFT HEART CATH AND CORONARY ANGIOGRAPHY N/A 10/08/2019   Procedure: LEFT HEART CATH AND CORONARY ANGIOGRAPHY;  Surgeon: Arnoldo Lapping, MD;  Location: [facility] INVASIVE CV LAB;  Service: Cardiovascular;  Laterality: N/A;   QUADRICEPS TENDON REPAIR Bilateral 06/15/2017   Procedure: REPAIR QUADRICEP TENDON;  Surgeon: Jasmine Mesi, MD;  Location: [facility] OR;  Service: Orthopedics;   Laterality: Bilateral;   SHOULDER SURGERY Right    TEE WITHOUT CARDIOVERSION N/A 07/03/2022   Procedure: TRANSESOPHAGEAL ECHOCARDIOGRAM (TEE);  Surgeon: Loyde Rule, MD;  Location: [facility] ENDOSCOPY;  Service: Cardiovascular;  Laterality: N/A;   TRANSCATHETER AORTIC VALVE REPLACEMENT, TRANSFEMORAL  12/23/2019   TRANSCATHETER AORTIC VALVE REPLACEMENT, TRANSFEMORAL N/A 12/23/2019   Procedure: TRANSCATHETER AORTIC VALVE REPLACEMENT, TRANSFEMORAL;  Surgeon: Arnoldo Lapping, MD;  Location: [facility] OR;  Service: Open Heart Surgery;  Laterality: N/A;   VASCULAR SURGERY     Patient Active Problem List   Diagnosis Date Noted   Hypertension    Severe aortic stenosis 10/08/2019   Candidiasis    Abnormality of gait    Hypoalbuminemia due to protein-calorie malnutrition [facility])    History of gout    Sleep disturbance    Constipation    Rupture of quadriceps tendon, right, sequela 06/19/2017   Quadriceps tendon rupture, left, sequela 06/19/2017   Acute blood loss anemia 06/19/2017   Fall    History of subdural hematoma    Post-operative pain    Recurrent falls    Quadriceps tendon rupture 06/15/2017   Primary osteoarthritis of both knees 02/28/2017   Primary osteoarthritis of both hands 02/28/2017   Uricacidemia 02/28/2017  Pain in joint of left knee 02/07/2017   Idiopathic chronic gout, unspecified site, without tophus (tophi) 11/29/2016   HEMATURIA UNSPECIFIED 01/25/2009   Abdominal pain 01/25/2009   XERODERMA 03/10/2008   UNS ADVRS EFF UNS RX MEDICINAL&BIOLOGICAL SBSTNC 03/10/2008   ADJUSTMENT REACTION WITH PHYSICAL SYMPTOMS 02/14/2008   MUSCLE SPASM 02/14/2008   ACUTE PROSTATITIS 12/16/2007   BREAST LUMP OR MASS, RIGHT 07/12/2007   H/O: RCT (rotator cuff tear) 01/13/2007   GOUT 01/08/2007    PCP: Elisabeth Guild, MD   REFERRING PROVIDER: Jasmine Mesi, MD   REFERRING DIAG:  [phone] (ICD-10-CM) - Synovitis of left knee  M19.011 (ICD-10-CM) - Primary osteoarthritis, right  shoulder  M54.50 (ICD-10-CM) - Low back pain, unspecified back pain laterality, unspecified chronicity, unspecified whether sciatica present    THERAPY DIAG:  Chronic right shoulder pain  Other low back pain  Muscle weakness (generalized)  Chronic pain of left knee  Rationale for Evaluation and Treatment: Rehabilitation  ONSET DATE: see subjective, on-going for years    PERTINENT HISTORY: HTN, severe aortic stenosis, abnormality of gait, h/o gout, rupture of quadriceps tendon on Rt 2018 on left 2018, subdural hematoma 2016, h/o falls, primary OA in bil hands Polio on Left side affecting Left UE strength  SUBJECTIVE:    SUBJECTIVE STATEMENT: Pt reporting Rt wrist pain and thumb pain. Pt was instructed to call Dr. Alvira Josephs to see if he would prescribe OT. Pt currently wearing wrist brace.   EVAL:  Pt stating he has been painting a lot at home and feels it may have exacerbated his Rt shoulder. Pt reporting radiation pain down his arm but no numbness/tingling. Pt stating Rt shoulder is giving him the most problem. Pt stating after getting the second shingles shot his shoulder pain increased into tricep and shoulder. Pt stating pain has been ongoing for about a year. Pt stating pain is worse at night.  Pt stating left knee pain has been ongoing since January 2018 when he was skiing and twisted his knee and he tore his quadriceps tendon. Pt stating he fell and when he fell he tore both tendons in bil LE. Pt then stating the repair didn't take on his left LE and it was repaired again. Pt stating he had PT and he gained ROM back in bil LE.  Pt reporting falling while in hospital during a bed transfer where he landed on his floor on his back in 2018. Pt stating his back has bothered him on/off ever since.  Pt stating he hasn't been able to work out over the past few months due to recent move into [place].   PAIN:  NPRS scale:4-5/10 pain  Pain description: achy,  throbbing Aggravating factors: resting, tylenol , voltaren  Relieving factors: Reaching, overhead function, spine flexion and slouched postures  PRECAUTIONS: None  WEIGHT BEARING RESTRICTIONS: No  FALLS:  Has patient fallen in last 6 months? No falls in last few years. Haven't fell since he tore his quad tendons and during a hospital transfer several years ago  LIVING ENVIRONMENT: Lives with: lives with their family and lives with an adult companion Lives in: House/apartment Stairs: Yes: Internal: 17 steps; on left going up and External: 2 steps; on left going up Has following equipment at home: None  OCCUPATION: retired  PLOF: Independent  PATIENT GOALS: Be able to work out at gym with less pain  Next MD visit:   OBJECTIVE:   DIAGNOSTIC FINDINGS: Shoulder: 08/06/23  3 views of the right shoulder including Grashey, scapular Y  and axial view  were ordered and reviewed by myself.  X-rays show severe osteoarthritis of  the right glenohumeral joint.  There is a large inferior spur off the  humeral head.  There is some chondrocalcinosis superior to the humeral  head, likely indicative of pseudogout or prior gout.  No acute bony  fracture noted.  There is bony sclerosis noted of the humeral head and  inferior acetabulum from likely arthritic change.   Knee 05/22/23:   1. Postsurgical changes of quadriceps tendon repair. The tendon appears intact. Resolution of the prior small fluid bright signal within the midsubstance of the quadriceps tendon insertion on prior 10/01/2018 MRI. 2. Stable chronic oblique tear of the anterior horn of the lateral meniscus. Mild degenerative undersurface fraying of the posterior horn of the lateral meniscus. 3. Interval worsening of moderate to severe patellofemoral cartilage degenerative changes. 4. Mildly worsened cartilage loss within the anteromedial aspect of the weight-bearing lateral femoral condyle and adjacent lateral tibial plateau. 5.  Unchanged high-grade partial to full-thickness cartilage loss within the weight-bearing medial femoral condyle.  Lumbar: 05/22/23:  AP lateral radiographs lumbar spine reviewed.  Grade 1-2 spondylolisthesis present at L5-S1 with pars fracture noted at the corresponding level.   Mild to moderate degenerative changes present in the upper and and middle lumbar spine region.  Visualized hips without arthritis    PATIENT SURVEYS:  12/05/23: FOTO intake:  49%  predicted:  51% 12/31/23: FOTO update 48%  02/27/24: FOTO update 57% 04/22/24: FOTO 59.5%  COGNITION: Overall cognitive status: WFL    SENSATION: WFL   POSTURE:  rounded shoulders, forward head, and decreased lumbar lordosis  PALPATION: TTP: distal left quad tendon, Rt upper trap and levator and anterior shoulder  MMT   Right 12/04/23 Left 12/04/23 Rt  / Left 12/18/23 HHD ppsi Right 02/29/24 Rt 03/17/24 Rt 03/26/24 Lt 03/26/24 Rt  04/22/24 Left 04/22/24  Hip flexion 5 5         Hip extension 5 5         Hip abduction 5 5         Hip adduction 5 5         Hip internal rotation           Hip external rotation           Knee flexion 5 5 52.5 / 40.0   64.1 51.0 60.0, 59.9 58.6, 57.6  Knee extension 5 5 54.4 / 65.5    52.1 64.3 74.2, 75.0  66.2, 61.8                  SHOULDER            Shoulder flexion 4 3         Shoulder abduction 4 3         Shoulder ER 4- 3  23.0 pounds 27.4 pounds 28.9 pounds  17.8, 18.5   Shoulder IR 4- 3  33.4 pounds 39.4 pounds 39.6 pounds  38.0, 37.6    (Blank rows = not tested)    ROM  LE Right 12/04/23 Left 12/04/23 Rt  01/22/24 Left 01/22/24 Active supine 02/18/24 Rt  Active standing 02/29/2024 Right  Hip flexion 108 110 112 112 140    Hip extension        Hip abduction        Hip adduction        Hip internal rotation        Hip external rotation  Knee flexion 134 126      Knee extension 0 0       SHOULDER:          Shoulder flexion 138 158 145 155 155 150  Shoulder  abduction 125 140   130   Shoulder ER 30 60   52 50  Shoulder IR      20  Shoulder horizontal adduction      35   (Blank rows = not tested)    FUNCTIONAL TESTS:   12/04/23 : 5 time sit to stand: 17.2 seconds no UE support 01/22/24: 5 time sit to stand: 9.2 seconds no UE support 02/18/24:  10.1 seconds no UE support  GAIT: Distance walked: clinic distances, level surface Assistive device utilized: None Level of assistance: Complete Independence Comments: mild forward flexed trunk posture                                                                                                                                                                        TODAY'S TREATMENT                                                                           DATE:   04/22/24 TherEx: Nustep level 8 seat 9 with BLEs/BUEs 8 min.   Seated Rt shoulder ER stretch: x 5 holding 10 sec MMT with HHD performed on bil LE's and Rt shoulder (see chart above) TherActivites:  Leg press: 112# 3 x 10 , single leg press 62# 2 x 10  Leg Press: calf raises 62# bil 2 x 10  Walking front/back with resistive strap around pt's waist x 10  NMR:  Fitter first: circle x 2 minutes with finger tap for support Agility ladder : stepping in/out x4 Stepping side to side x 4 Diagonal stepping x 4       04/15/2024:   TherEx: Nustep level 7 seat 9 with BLEs/BUEs 10 min.   TherActivites:  Leg press: 112# 3 x 10 , single leg press 56# 2 x 10  NMR:  Sitting on green Swiss ball: (core activation and dynamic stability) 10 reps lateral pelvic shifting bil  Pelvic circles: x 10 bil directions Rows: 15 reps blue theraband Bil UE  Shoulder extension blue theraband - alternating UEs 10 reps & BUEs 10 reps Alternating stepping LE out / back knee ext / flexion 10 reps  Alternating stepping out to side and back in - abd / add  10 reps Marching 10 reps Marching with contralateral UE touching ant knee for counter trunk rotation 10  reps LAQ: x 10 bil LE      04/09/2024:   Nustep level 7 seat 9 with BLEs/BUEs 10 min.    Sitting on green Swiss ball:  10 reps ant/post roll using ant/post pelvic tilt 10 reps side to side roll using pelvic 15 reps blue theraband BUE rows 10 reps circles CW & CCW using pelvis Shoulder extension blue theraband - alternating UEs 10 reps & BUEs 10 reps Alternating stepping LE out / back knee ext / flexion 10 reps  Alternating stepping out to side and back in - abd / add 10 reps Sitting upright BUEs (elbows bent at 90*) horizontal abd /add Scapular depression reaching single UE down & back for 10 reps ea.  Marching 10 reps Marching with contralateral UE touching ant knee for counter trunk rotation 10 reps Blue theraband IR & ER with ea UE 10 reps ea. PT & pt discussed safe set-up for home in corner with chairs.  Doing exercises even without theraband for shoulders. Include both back & shoulder exercises with upright posture.  Avoid fatigue especially with history of Polio.  Pt verbalized understanding.     TREATMENT                                                                           DATE:   03/26/24:   Nustep L6 X 10 min UE/LE Seated IR and ER of Rt shoulder isometrics Knee flexion/extension MMT and updated ROM measurements see above There Activities:  Leg Press: 112# bil LE's  3x 10, single leg 56# 2 x 15 bil Sit to stand: x 10  NMR:  Tandem walking in parallel bars x 3 with no UE support on level surface Single leg stance with toe touch on opposite LE x 5 holding 30 sec bil LE c intermittent UE support as needed Standing on BOSU ball x 3 minutes with intermittent UE support working on upright postural control Manual:  Percussion to lumbar paraspinals, QL, and glutes x 8 minutes      PATIENT EDUCATION:  Education details: HEP, POC Person educated: Patient Education method: Programmer, multimedia, Facilities manager, Verbal cues, and Handouts Education comprehension: verbalized  understanding, returned demonstration, and verbal cues required  HOME EXERCISE PROGRAM: Access Code: [email or web address] URL: [email or web address] Date: 04/15/2024 Prepared by: Jerrel Mor  Exercises - Standing Shoulder Row with Anchored Resistance  - 1 x daily - 7 x weekly - 3 sets - 10 reps - Supine Bridge  - 2 x daily - 7 x weekly - 2 sets - 10 reps - 5 seconds hold - Supine Shoulder External Rotation in 45 Degrees Abduction AAROM with Dowel  - 1 x daily - 7 x weekly - 3 sets - 10 reps - Supine Shoulder Flexion Extension AAROM with Dowel  - 2-3 x daily - 7 x weekly - 2 sets - 10 reps - Seated Small Alternating Straight Leg Lifts with Heel Touch  - 2 x daily - 7 x weekly - 15-20 reps - Recumbent Bike  - 2-3 x daily - 7 x weekly - Bridge with Hip Abduction and Resistance  - 1-2 x daily -  7 x weekly - 2 sets - 10 reps - Supine Shoulder External Rotation Stretch  - 2 x daily - 7 x weekly - 1 sets - 10-20 reps - 10 seconds hold - Supine Shoulder Internal Rotation  - 2 x daily - 7 x weekly - 1 sets - 10-20 reps - 10 seconds hold - Standing Scapular Retraction  - 5 x daily - 7 x weekly - 1 sets - 5 reps - 5 second hold - Swiss Ball March  - 1 x daily - 7 x weekly - 2 sets - 10 reps - Seated Lateral Pelvic Tilt on Swiss Ball  - 1 x daily - 7 x weekly - 10 reps - Pelvic Circles on [employer]  - 1 x daily - 7 x weekly - 3 sets - 10 reps - Swiss Ball Knee Extension  - 1 x daily - 7 x weekly - 2 sets - 10 reps - Seated Shoulder Extension and Scapular Retraction with Resistance on Swiss Ball  - 1 x daily - 7 x weekly - 2 sets - 10 reps - Seated Shoulder W External Rotation on Swiss Ball  - 1 x daily - 7 x weekly - 10 reps - Row with Anchored Resistance on [employer]  - 1 x daily - 7 x weekly - 2 sets - 10 reps  ASSESSMENT:  CLINICAL IMPRESSION: Pt arriving today reporting 7-8/10 pain his back, Rt shoulder, Rt wrist/hand. Pt stating he didn't call his MD about OT referral, so I sent a  message to pt's PA, Moss Argyle and to provider via telephone encounter. Pt with improvements in Rt LE strength noted by HHD MMT. Pt tolerating all exercises well. Continue skilled PT.   OBJECTIVE IMPAIRMENTS: difficulty walking, decreased ROM, decreased strength, increased edema, impaired flexibility, impaired UE functional use, postural dysfunction, and pain.   ACTIVITY LIMITATIONS: lifting, bending, standing, squatting, sleeping, stairs, transfers, reach over head, and hygiene/grooming  PARTICIPATION LIMITATIONS: cleaning, community activity, and yard work  PERSONAL FACTORS: 3+ comorbidities: see pertinent history  are also affecting patient's functional outcome.   REHAB POTENTIAL: Good  CLINICAL DECISION MAKING: Evolving/moderate complexity  EVALUATION COMPLEXITY: High   GOALS: Goals reviewed with patient? Yes  SHORT TERM GOALS: (target date for Short term goals are 3 weeks 12/25/2023)   1.  Patient will demonstrate independent use of home exercise program to maintain progress from in clinic treatments.  Goal status:MET 02/18/24  2. Pt to improve his 5 time sit to stand to </=  13  seconds with UE support.   Goal Status: MET 02/18/24   LONG TERM GOALS: (target dates for all long term goals are 8 weeks 05/21/24)   1. Patient will demonstrate/report pain at worst less than or equal to 2/10 to facilitate minimal limitation in daily activity secondary to pain symptoms.  Goal status: On Going 04/09/24 (can reach 5-6/10 at times)   2. Patient will demonstrate independent use of home exercise program to facilitate ability to maintain/progress functional gains from skilled physical therapy services.  Goal status: On Going 04/09/24   3. Patient will demonstrate FOTO outcome > or = 51 % to indicate reduced disability due to condition.  49.5% FOTO 02/18/24 Goal status: On Going 04/09/24   4.  Patient will be able to perform up and down 1 flight of stairs with reciprocal gait pattern with left  knee pain and low back pain </= 2/10.   Goal status: On Going 04/09/24   5.  Patient  will demonstrate improved Rt UE flexion to >/= 150 degrees in order to improve functional mobility.   Goal status: Met 02/29/2024   6.  Pt will improve Rt shoulder ER to >/= 60 deg in order to improve functional mobility.  Goal status: On Going 04/09/24      PLAN:  PT FREQUENCY: 1-2x/week  PT DURATION: 6 weeks  PLANNED INTERVENTIONS: Can include 16109- PT Re-evaluation, 97110-Therapeutic exercises, 97530- Therapeutic activity, 97112- Neuromuscular re-education, 97535- Self Care, 97140- Manual therapy, [phone]- Gait training, [phone]- Orthotic Fit/training, [phone]- Canalith repositioning, [id number]- Aquatic Therapy, 97014- Electrical stimulation (unattended), [id number]- Electrical stimulation (manual), [id number]- Vasopneumatic device, [id number]- Ultrasound, [id number]- Traction (mechanical), [id number]- Ionotophoresis 4mg /ml Dexamethasone , Patient/Family education, Balance training, Stair training, Taping, Dry Needling, Joint mobilization, Joint manipulation, Spinal manipulation, Spinal mobilization, Scar mobilization, Vestibular training, Visual/preceptual remediation/compensation, DME instructions, Cryotherapy, and Moist heat.  All performed as medically necessary.  All included unless contraindicated  PLAN FOR NEXT SESSION:    Continue with shoulder and back exercises, capsular stretching, body mechanics, low back and postural strengthening, try percussion or STM as needed  Re-certification: 03/26/24 for 8 weeks 1-2x/ week   Marysue Sola, PT, MPT 04/22/2024, 12:48 PM

## 2024-04-22 NOTE — Telephone Encounter (Signed)
 Okay to send

## 2024-04-29 ENCOUNTER — Ambulatory Visit (INDEPENDENT_AMBULATORY_CARE_PROVIDER_SITE_OTHER): Admitting: Physical Therapy

## 2024-04-29 ENCOUNTER — Encounter: Payer: Self-pay | Admitting: Physical Therapy

## 2024-04-29 DIAGNOSIS — G8929 Other chronic pain: Secondary | ICD-10-CM

## 2024-04-29 DIAGNOSIS — M5459 Other low back pain: Secondary | ICD-10-CM | POA: Diagnosis not present

## 2024-04-29 DIAGNOSIS — M6281 Muscle weakness (generalized): Secondary | ICD-10-CM

## 2024-04-29 DIAGNOSIS — M25511 Pain in right shoulder: Secondary | ICD-10-CM | POA: Diagnosis not present

## 2024-04-29 NOTE — Therapy (Signed)
 OUTPATIENT PHYSICAL THERAPY TREATMENT    Patient Name: Jose [name] MRN: [id number] DOB:1951/09/12, 73 y.o., male Today's Date: 04/29/2024  END OF SESSION:  PT End of Session - 04/29/24 1151     Visit Number 14    Number of Visits 22    Date for PT Re-Evaluation 05/21/24    Authorization Type Re-certification for 1-2x/ week for up to 8 weeks    PT Start Time 1145    PT Stop Time 1229    PT Time Calculation (min) 44 min    Activity Tolerance Patient tolerated treatment well;No increased pain    Behavior During Therapy WFL for tasks assessed/performed                       Past Medical History:  Diagnosis Date   Arthritis    R shoulder, great toes, hands- has gout & has injections in knees with Dr. Alvira Josephs    Bicuspid aortic valve with ascending aorta 4.0 to 4.5 cm in diameter    CAD (coronary artery disease)    Cancer (HCC)    skin ca-    GERD (gastroesophageal reflux disease)    Headache    History of hiatal hernia    Hypertension    Prostate cancer (HCC)    S/P TAVR (transcatheter aortic valve replacement)    SDH (subdural hematoma) (HCC)    Severe aortic stenosis    Thoracic ascending aortic aneurysm (HCC)    Vertebral artery aneurysm [facility])    Past Surgical History:  Procedure Laterality Date   BRAIN SURGERY  2016   evacuation of SDH   CARDIAC CATHETERIZATION     CARPAL TUNNEL RELEASE Bilateral    embolization of left vertebral artery aneurysm with pipeline/coil  10/31/2022   GREEN LIGHT LASER TURP (TRANSURETHRAL RESECTION OF PROSTATE  04/27/2017   KNEE ARTHROPLASTY     LEFT HEART CATH AND CORONARY ANGIOGRAPHY N/A 10/08/2019   Procedure: LEFT HEART CATH AND CORONARY ANGIOGRAPHY;  Surgeon: Arnoldo Lapping, MD;  Location: [facility] INVASIVE CV LAB;  Service: Cardiovascular;  Laterality: N/A;   QUADRICEPS TENDON REPAIR Bilateral 06/15/2017   Procedure: REPAIR QUADRICEP TENDON;  Surgeon: Jasmine Mesi, MD;  Location: [facility] OR;  Service: Orthopedics;   Laterality: Bilateral;   SHOULDER SURGERY Right    TEE WITHOUT CARDIOVERSION N/A 07/03/2022   Procedure: TRANSESOPHAGEAL ECHOCARDIOGRAM (TEE);  Surgeon: Loyde Rule, MD;  Location: [facility] ENDOSCOPY;  Service: Cardiovascular;  Laterality: N/A;   TRANSCATHETER AORTIC VALVE REPLACEMENT, TRANSFEMORAL  12/23/2019   TRANSCATHETER AORTIC VALVE REPLACEMENT, TRANSFEMORAL N/A 12/23/2019   Procedure: TRANSCATHETER AORTIC VALVE REPLACEMENT, TRANSFEMORAL;  Surgeon: Arnoldo Lapping, MD;  Location: [facility] OR;  Service: Open Heart Surgery;  Laterality: N/A;   VASCULAR SURGERY     Patient Active Problem List   Diagnosis Date Noted   Hypertension    Severe aortic stenosis 10/08/2019   Candidiasis    Abnormality of gait    Hypoalbuminemia due to protein-calorie malnutrition [facility])    History of gout    Sleep disturbance    Constipation    Rupture of quadriceps tendon, right, sequela 06/19/2017   Quadriceps tendon rupture, left, sequela 06/19/2017   Acute blood loss anemia 06/19/2017   Fall    History of subdural hematoma    Post-operative pain    Recurrent falls    Quadriceps tendon rupture 06/15/2017   Primary osteoarthritis of both knees 02/28/2017   Primary osteoarthritis of both hands 02/28/2017   Uricacidemia 02/28/2017  Pain in joint of left knee 02/07/2017   Idiopathic chronic gout, unspecified site, without tophus (tophi) 11/29/2016   HEMATURIA UNSPECIFIED 01/25/2009   Abdominal pain 01/25/2009   XERODERMA 03/10/2008   UNS ADVRS EFF UNS RX MEDICINAL&BIOLOGICAL SBSTNC 03/10/2008   ADJUSTMENT REACTION WITH PHYSICAL SYMPTOMS 02/14/2008   MUSCLE SPASM 02/14/2008   ACUTE PROSTATITIS 12/16/2007   BREAST LUMP OR MASS, RIGHT 07/12/2007   H/O: RCT (rotator cuff tear) 01/13/2007   GOUT 01/08/2007    PCP: Elisabeth Guild, MD   REFERRING PROVIDER: Nicholas Bari, MD   REFERRING DIAG:  [id number] (ICD-10-CM) - Synovitis of left knee  M19.011 (ICD-10-CM) - Primary osteoarthritis, right  shoulder  M54.50 (ICD-10-CM) - Low back pain, unspecified back pain laterality, unspecified chronicity, unspecified whether sciatica present    THERAPY DIAG:  Chronic right shoulder pain  Other low back pain  Muscle weakness (generalized)  Rationale for Evaluation and Treatment: Rehabilitation  ONSET DATE: see subjective, on-going for years    PERTINENT HISTORY: HTN, severe aortic stenosis, abnormality of gait, h/o gout, rupture of quadriceps tendon on Rt 2018 on left 2018, subdural hematoma 2016, h/o falls, primary OA in bil hands Polio on Left side affecting Left UE strength  SUBJECTIVE:    SUBJECTIVE STATEMENT: He is seeing OT hand specialist on Thursday.  His back has been better but shoulder still hurts.    EVAL:  Pt stating he has been painting a lot at home and feels it may have exacerbated his Rt shoulder. Pt reporting radiation pain down his arm but no numbness/tingling. Pt stating Rt shoulder is giving him the most problem. Pt stating after getting the second shingles shot his shoulder pain increased into tricep and shoulder. Pt stating pain has been ongoing for about a year. Pt stating pain is worse at night.  Pt stating left knee pain has been ongoing since January 2018 when he was skiing and twisted his knee and he tore his quadriceps tendon. Pt stating he fell and when he fell he tore both tendons in bil LE. Pt then stating the repair didn't take on his left LE and it was repaired again. Pt stating he had PT and he gained ROM back in bil LE.  Pt reporting falling while in hospital during a bed transfer where he landed on his floor on his back in 2018. Pt stating his back has bothered him on/off ever since.  Pt stating he hasn't been able to work out over the past few months due to recent move into [place].   PAIN:  NPRS scale:  ranging  pain shoulder 0-8/10, low back 0-6/10 Pain description: achy, throbbing Aggravating factors: resting, tylenol ,  voltaren  Relieving factors: Reaching, overhead function, spine flexion and slouched postures  PRECAUTIONS: None  WEIGHT BEARING RESTRICTIONS: No  FALLS:  Has patient fallen in last 6 months? No falls in last few years. Haven't fell since he tore his quad tendons and during a hospital transfer several years ago  LIVING ENVIRONMENT: Lives with: lives with their family and lives with an adult companion Lives in: House/apartment Stairs: Yes: Internal: 17 steps; on left going up and External: 2 steps; on left going up Has following equipment at home: None  OCCUPATION: retired  PLOF: Independent  PATIENT GOALS: Be able to work out at gym with less pain  Next MD visit:   OBJECTIVE:   DIAGNOSTIC FINDINGS: Shoulder: 08/06/23  3 views of the right shoulder including Grashey, scapular Y and axial view  were ordered and reviewed  by myself.  X-rays show severe osteoarthritis of  the right glenohumeral joint.  There is a large inferior spur off the  humeral head.  There is some chondrocalcinosis superior to the humeral  head, likely indicative of pseudogout or prior gout.  No acute bony  fracture noted.  There is bony sclerosis noted of the humeral head and  inferior acetabulum from likely arthritic change.   Knee 05/22/23:   1. Postsurgical changes of quadriceps tendon repair. The tendon appears intact. Resolution of the prior small fluid bright signal within the midsubstance of the quadriceps tendon insertion on prior 10/01/2018 MRI. 2. Stable chronic oblique tear of the anterior horn of the lateral meniscus. Mild degenerative undersurface fraying of the posterior horn of the lateral meniscus. 3. Interval worsening of moderate to severe patellofemoral cartilage degenerative changes. 4. Mildly worsened cartilage loss within the anteromedial aspect of the weight-bearing lateral femoral condyle and adjacent lateral tibial plateau. 5. Unchanged high-grade partial to full-thickness  cartilage loss within the weight-bearing medial femoral condyle.  Lumbar: 05/22/23:  AP lateral radiographs lumbar spine reviewed.  Grade 1-2 spondylolisthesis present at L5-S1 with pars fracture noted at the corresponding level.   Mild to moderate degenerative changes present in the upper and and middle lumbar spine region.  Visualized hips without arthritis    PATIENT SURVEYS:  12/05/23: FOTO intake:  49%  predicted:  51% 12/31/23: FOTO update 48%  02/27/24: FOTO update 57% 04/22/24: FOTO 59.5%  COGNITION: Overall cognitive status: WFL    SENSATION: WFL   POSTURE:  rounded shoulders, forward head, and decreased lumbar lordosis  PALPATION: TTP: distal left quad tendon, Rt upper trap and levator and anterior shoulder  MMT   Right 12/04/23 Left 12/04/23 Rt  / Left 12/18/23 HHD ppsi Right 02/29/24 Rt 03/17/24 Rt 03/26/24 Lt 03/26/24 Rt  04/22/24 Left 04/22/24  Hip flexion 5 5         Hip extension 5 5         Hip abduction 5 5         Hip adduction 5 5         Hip internal rotation           Hip external rotation           Knee flexion 5 5 52.5 / 40.0   64.1 51.0 60.0, 59.9 58.6, 57.6  Knee extension 5 5 54.4 / 65.5    52.1 64.3 74.2, 75.0  66.2, 61.8                  SHOULDER            Shoulder flexion 4 3         Shoulder abduction 4 3         Shoulder ER 4- 3  23.0 pounds 27.4 pounds 28.9 pounds  17.8, 18.5   Shoulder IR 4- 3  33.4 pounds 39.4 pounds 39.6 pounds  38.0, 37.6    (Blank rows = not tested)    ROM  LE Right 12/04/23 Left 12/04/23 Rt  01/22/24 Left 01/22/24 Active supine 02/18/24 Rt  Active standing 02/29/2024 Right  Hip flexion 108 110 112 112 140    Hip extension        Hip abduction        Hip adduction        Hip internal rotation        Hip external rotation        Knee flexion 134  126      Knee extension 0 0       SHOULDER:          Shoulder flexion 138 158 145 155 155 150  Shoulder abduction 125 140   130   Shoulder ER 30 60   52  50  Shoulder IR      20  Shoulder horizontal adduction      35   (Blank rows = not tested)    FUNCTIONAL TESTS:   12/04/23 : 5 time sit to stand: 17.2 seconds no UE support 01/22/24: 5 time sit to stand: 9.2 seconds no UE support 02/18/24:  10.1 seconds no UE support  GAIT: Distance walked: clinic distances, level surface Assistive device utilized: None Level of assistance: Complete Independence Comments: mild forward flexed trunk posture                                                                                                                                                                        TODAY'S TREATMENT                                                                           DATE:   04/29/2024 Therapeutic Exercise: Reactive / isometric shoulder internal & external rotation green theraband (looped over wrist to protect hand)  10 reps BUEs Pt has LLE foot drop with gait activities.  PT educated in gastroc stretch heel depression on step & pt performed 30 sec hold 2 reps BLEs.   Then alternating BLE heel raises & toe raises with pelvic weight shifts 10 reps PT added above 2 exercises to HEP with HO.  Pt verbalized understanding after performing in clinic.   NMR:  Sitting on green Swiss ball: (core activation and dynamic stability) 10 reps lateral pelvic shifting bil  Pelvic circles: x 10 bil directions Rows: alternating UEs 5 reps then Bil UE 15 reps blue theraband  Shoulder extension blue theraband - alternating UEs 10 reps & BUEs 10 reps Alternating stepping LE out / back knee ext / flexion 10 reps  Alternating stepping out to side and back in - abd / add 10 reps Marching 10 reps Marching with contralateral UE touching ant knee for counter trunk rotation 10 reps LAQ: x 10 bil LE   TREATMENT  DATE:   04/22/24 TherEx: Nustep level 8 seat 9 with BLEs/BUEs 8 min.   Seated Rt shoulder ER  stretch: x 5 holding 10 sec MMT with HHD performed on bil LE's and Rt shoulder (see chart above) TherActivites:  Leg press: 112# 3 x 10 , single leg press 62# 2 x 10  Leg Press: calf raises 62# bil 2 x 10  Walking front/back with resistive strap around pt's waist x 10  NMR:  Fitter first: circle x 2 minutes with finger tap for support Agility ladder : stepping in/out x4 Stepping side to side x 4 Diagonal stepping x 4       04/15/2024:   TherEx: Nustep level 7 seat 9 with BLEs/BUEs 10 min.   TherActivites:  Leg press: 112# 3 x 10 , single leg press 56# 2 x 10  NMR:  Sitting on green Swiss ball: (core activation and dynamic stability) 10 reps lateral pelvic shifting bil  Pelvic circles: x 10 bil directions Rows: 15 reps blue theraband Bil UE  Shoulder extension blue theraband - alternating UEs 10 reps & BUEs 10 reps Alternating stepping LE out / back knee ext / flexion 10 reps  Alternating stepping out to side and back in - abd / add 10 reps Marching 10 reps Marching with contralateral UE touching ant knee for counter trunk rotation 10 reps LAQ: x 10 bil LE      04/09/2024:   Nustep level 7 seat 9 with BLEs/BUEs 10 min.    Sitting on green Swiss ball:  10 reps ant/post roll using ant/post pelvic tilt 10 reps side to side roll using pelvic 15 reps blue theraband BUE rows 10 reps circles CW & CCW using pelvis Shoulder extension blue theraband - alternating UEs 10 reps & BUEs 10 reps Alternating stepping LE out / back knee ext / flexion 10 reps  Alternating stepping out to side and back in - abd / add 10 reps Sitting upright BUEs (elbows bent at 90*) horizontal abd /add Scapular depression reaching single UE down & back for 10 reps ea.  Marching 10 reps Marching with contralateral UE touching ant knee for counter trunk rotation 10 reps Blue theraband IR & ER with ea UE 10 reps ea. PT & pt discussed safe set-up for home in corner with chairs.  Doing exercises even  without theraband for shoulders. Include both back & shoulder exercises with upright posture.  Avoid fatigue especially with history of Polio.  Pt verbalized understanding.       PATIENT EDUCATION:  Education details: HEP, POC Person educated: Patient Education method: Programmer, multimedia, Demonstration, Verbal cues, and Handouts Education comprehension: verbalized understanding, returned demonstration, and verbal cues required  HOME EXERCISE PROGRAM: Access Code: [email or web address] URL: [email or web address] Date: 04/15/2024 Prepared by: Jerrel Mor  Exercises - Standing Shoulder Row with Anchored Resistance  - 1 x daily - 7 x weekly - 3 sets - 10 reps - Supine Bridge  - 2 x daily - 7 x weekly - 2 sets - 10 reps - 5 seconds hold - Supine Shoulder External Rotation in 45 Degrees Abduction AAROM with Dowel  - 1 x daily - 7 x weekly - 3 sets - 10 reps - Supine Shoulder Flexion Extension AAROM with Dowel  - 2-3 x daily - 7 x weekly - 2 sets - 10 reps - Seated Small Alternating Straight Leg Lifts with Heel Touch  - 2 x daily - 7 x weekly - 15-20 reps - Recumbent Bike  - 2-3  x daily - 7 x weekly - Bridge with Hip Abduction and Resistance  - 1-2 x daily - 7 x weekly - 2 sets - 10 reps - Supine Shoulder External Rotation Stretch  - 2 x daily - 7 x weekly - 1 sets - 10-20 reps - 10 seconds hold - Supine Shoulder Internal Rotation  - 2 x daily - 7 x weekly - 1 sets - 10-20 reps - 10 seconds hold - Standing Scapular Retraction  - 5 x daily - 7 x weekly - 1 sets - 5 reps - 5 second hold - Swiss Ball March  - 1 x daily - 7 x weekly - 2 sets - 10 reps - Seated Lateral Pelvic Tilt on Swiss Ball  - 1 x daily - 7 x weekly - 10 reps - Pelvic Circles on [employer]  - 1 x daily - 7 x weekly - 3 sets - 10 reps - Swiss Ball Knee Extension  - 1 x daily - 7 x weekly - 2 sets - 10 reps - Seated Shoulder Extension and Scapular Retraction with Resistance on Swiss Ball  - 1 x daily - 7 x weekly - 2 sets - 10  reps - Seated Shoulder W External Rotation on Swiss Ball  - 1 x daily - 7 x weekly - 10 reps - Row with Anchored Resistance on [employer]  - 1 x daily - 7 x weekly - 2 sets - 10 reps  ASSESSMENT:  CLINICAL IMPRESSION: Patient appears to understand gastroc stretch and heel/toe raises which will effect balance and knee stability.   Continue skilled PT.   OBJECTIVE IMPAIRMENTS: difficulty walking, decreased ROM, decreased strength, increased edema, impaired flexibility, impaired UE functional use, postural dysfunction, and pain.   ACTIVITY LIMITATIONS: lifting, bending, standing, squatting, sleeping, stairs, transfers, reach over head, and hygiene/grooming  PARTICIPATION LIMITATIONS: cleaning, community activity, and yard work  PERSONAL FACTORS: 3+ comorbidities: see pertinent history are also affecting patient's functional outcome.   REHAB POTENTIAL: Good  CLINICAL DECISION MAKING: Evolving/moderate complexity  EVALUATION COMPLEXITY: High   GOALS: Goals reviewed with patient? Yes  SHORT TERM GOALS: (target date for Short term goals are 3 weeks 12/25/2023)   1.  Patient will demonstrate independent use of home exercise program to maintain progress from in clinic treatments.  Goal status:MET 02/18/24  2. Pt to improve his 5 time sit to stand to </=  13  seconds with UE support.   Goal Status: MET 02/18/24   LONG TERM GOALS: (target dates for all long term goals are 8 weeks 05/21/24)   1. Patient will demonstrate/report pain at worst less than or equal to 2/10 to facilitate minimal limitation in daily activity secondary to pain symptoms.  Goal status: On Going 04/29/2024 (can reach 5-6/10 at times)   2. Patient will demonstrate independent use of home exercise program to facilitate ability to maintain/progress functional gains from skilled physical therapy services.  Goal status: On Going 04/29/2024   3. Patient will demonstrate FOTO outcome > or = 51 % to indicate reduced disability  due to condition.  49.5% FOTO 02/18/24 Goal status: On Going 04/29/2024   4.  Patient will be able to perform up and down 1 flight of stairs with reciprocal gait pattern with left knee pain and low back pain </= 2/10.   Goal status: On Going 04/29/2024   5.  Patient will demonstrate improved Rt UE flexion to >/= 150 degrees in order to improve functional mobility.  Goal status: Met 02/29/2024   6.  Pt will improve Rt shoulder ER to >/= 60 deg in order to improve functional mobility.  Goal status: On Going   04/29/2024      PLAN:  PT FREQUENCY: 1-2x/week  PT DURATION: 6 weeks  PLANNED INTERVENTIONS: Can include 09811- PT Re-evaluation, 97110-Therapeutic exercises, 97530- Therapeutic activity, 97112- Neuromuscular re-education, 97535- Self Care, 97140- Manual therapy, [phone]- Gait training, [phone]- Orthotic Fit/training, [phone]- Canalith repositioning, [id number]- Aquatic Therapy, 97014- Electrical stimulation (unattended), [id number]- Electrical stimulation (manual), [id number]- Vasopneumatic device, [id number]- Ultrasound, [id number]- Traction (mechanical), [id number]- Ionotophoresis 4mg /ml Dexamethasone , Patient/Family education, Balance training, Stair training, Taping, Dry Needling, Joint mobilization, Joint manipulation, Spinal manipulation, Spinal mobilization, Scar mobilization, Vestibular training, Visual/preceptual remediation/compensation, DME instructions, Cryotherapy, and Moist heat.  All performed as medically necessary.  All included unless contraindicated  PLAN FOR NEXT SESSION:   check on updated HEP,  may add reactive isometric shoulder exercises to HEP.   Continue with shoulder and back exercises, capsular stretching, body mechanics, low back and postural strengthening, try percussion or STM as needed  Re-certification: 03/26/24 for 8 weeks 1-2x/ week   Lorie Rook, PT, DPT 04/29/2024, 12:41 PM

## 2024-04-30 ENCOUNTER — Encounter: Payer: Self-pay | Admitting: Physical Therapy

## 2024-04-30 ENCOUNTER — Ambulatory Visit (INDEPENDENT_AMBULATORY_CARE_PROVIDER_SITE_OTHER): Admitting: Physical Therapy

## 2024-04-30 DIAGNOSIS — M5459 Other low back pain: Secondary | ICD-10-CM | POA: Diagnosis not present

## 2024-04-30 DIAGNOSIS — M25511 Pain in right shoulder: Secondary | ICD-10-CM | POA: Diagnosis not present

## 2024-04-30 DIAGNOSIS — M6281 Muscle weakness (generalized): Secondary | ICD-10-CM | POA: Diagnosis not present

## 2024-04-30 DIAGNOSIS — G8929 Other chronic pain: Secondary | ICD-10-CM

## 2024-04-30 NOTE — Therapy (Signed)
 OUTPATIENT PHYSICAL THERAPY TREATMENT    Patient Name: Jose [name] MRN: [id number] DOB:12-17-1951, 73 y.o., male Today's Date: 04/30/2024  END OF SESSION:  PT End of Session - 04/30/24 0945     Visit Number 15    Number of Visits 22    Date for PT Re-Evaluation 05/21/24    Authorization Type Re-certification for 1-2x/ week for up to 8 weeks    PT Start Time 0936    PT Stop Time 1015    PT Time Calculation (min) 39 min    Activity Tolerance Patient tolerated treatment well;No increased pain    Behavior During Therapy WFL for tasks assessed/performed                        Past Medical History:  Diagnosis Date   Arthritis    R shoulder, great toes, hands- has gout & has injections in knees with Dr. Alvira Josephs    Bicuspid aortic valve with ascending aorta 4.0 to 4.5 cm in diameter    CAD (coronary artery disease)    Cancer (HCC)    skin ca-    GERD (gastroesophageal reflux disease)    Headache    History of hiatal hernia    Hypertension    Prostate cancer (HCC)    S/P TAVR (transcatheter aortic valve replacement)    SDH (subdural hematoma) (HCC)    Severe aortic stenosis    Thoracic ascending aortic aneurysm (HCC)    Vertebral artery aneurysm [facility])    Past Surgical History:  Procedure Laterality Date   BRAIN SURGERY  2016   evacuation of SDH   CARDIAC CATHETERIZATION     CARPAL TUNNEL RELEASE Bilateral    embolization of left vertebral artery aneurysm with pipeline/coil  10/31/2022   GREEN LIGHT LASER TURP (TRANSURETHRAL RESECTION OF PROSTATE  04/27/2017   KNEE ARTHROPLASTY     LEFT HEART CATH AND CORONARY ANGIOGRAPHY N/A 10/08/2019   Procedure: LEFT HEART CATH AND CORONARY ANGIOGRAPHY;  Surgeon: Arnoldo Lapping, MD;  Location: [facility] INVASIVE CV LAB;  Service: Cardiovascular;  Laterality: N/A;   QUADRICEPS TENDON REPAIR Bilateral 06/15/2017   Procedure: REPAIR QUADRICEP TENDON;  Surgeon: Jasmine Mesi, MD;  Location: [facility] OR;  Service:  Orthopedics;  Laterality: Bilateral;   SHOULDER SURGERY Right    TEE WITHOUT CARDIOVERSION N/A 07/03/2022   Procedure: TRANSESOPHAGEAL ECHOCARDIOGRAM (TEE);  Surgeon: Loyde Rule, MD;  Location: [facility] ENDOSCOPY;  Service: Cardiovascular;  Laterality: N/A;   TRANSCATHETER AORTIC VALVE REPLACEMENT, TRANSFEMORAL  12/23/2019   TRANSCATHETER AORTIC VALVE REPLACEMENT, TRANSFEMORAL N/A 12/23/2019   Procedure: TRANSCATHETER AORTIC VALVE REPLACEMENT, TRANSFEMORAL;  Surgeon: Arnoldo Lapping, MD;  Location: [facility] OR;  Service: Open Heart Surgery;  Laterality: N/A;   VASCULAR SURGERY     Patient Active Problem List   Diagnosis Date Noted   Hypertension    Severe aortic stenosis 10/08/2019   Candidiasis    Abnormality of gait    Hypoalbuminemia due to protein-calorie malnutrition [facility])    History of gout    Sleep disturbance    Constipation    Rupture of quadriceps tendon, right, sequela 06/19/2017   Quadriceps tendon rupture, left, sequela 06/19/2017   Acute blood loss anemia 06/19/2017   Fall    History of subdural hematoma    Post-operative pain    Recurrent falls    Quadriceps tendon rupture 06/15/2017   Primary osteoarthritis of both knees 02/28/2017   Primary osteoarthritis of both hands 02/28/2017   Uricacidemia 02/28/2017  Pain in joint of left knee 02/07/2017   Idiopathic chronic gout, unspecified site, without tophus (tophi) 11/29/2016   HEMATURIA UNSPECIFIED 01/25/2009   Abdominal pain 01/25/2009   XERODERMA 03/10/2008   UNS ADVRS EFF UNS RX MEDICINAL&BIOLOGICAL SBSTNC 03/10/2008   ADJUSTMENT REACTION WITH PHYSICAL SYMPTOMS 02/14/2008   MUSCLE SPASM 02/14/2008   ACUTE PROSTATITIS 12/16/2007   BREAST LUMP OR MASS, RIGHT 07/12/2007   H/O: RCT (rotator cuff tear) 01/13/2007   GOUT 01/08/2007    PCP: Elisabeth Guild, MD   REFERRING PROVIDER: Nicholas Bari, MD   REFERRING DIAG:  [phone] (ICD-10-CM) - Synovitis of left knee  M19.011 (ICD-10-CM) - Primary osteoarthritis,  right shoulder  M54.50 (ICD-10-CM) - Low back pain, unspecified back pain laterality, unspecified chronicity, unspecified whether sciatica present    THERAPY DIAG:  Chronic right shoulder pain  Other low back pain  Muscle weakness (generalized)  Rationale for Evaluation and Treatment: Rehabilitation  ONSET DATE: see subjective, on-going for years  SUBJECTIVE:  SUBJECTIVE STATEMENT: Pain in thumb may be gout.  Foot drop started when he came out of cast from knee surgery.   EVAL:  Pt stating he has been painting a lot at home and feels it may have exacerbated his Rt shoulder. Pt reporting radiation pain down his arm but no numbness/tingling. Pt stating Rt shoulder is giving him the most problem. Pt stating after getting the second shingles shot his shoulder pain increased into tricep and shoulder. Pt stating pain has been ongoing for about a year. Pt stating pain is worse at night.  Pt stating left knee pain has been ongoing since January 2018 when he was skiing and twisted his knee and he tore his quadriceps tendon. Pt stating he fell and when he fell he tore both tendons in bil LE. Pt then stating the repair didn't take on his left LE and it was repaired again. Pt stating he had PT and he gained ROM back in bil LE.  Pt reporting falling while in hospital during a bed transfer where he landed on his floor on his back in 2018. Pt stating his back has bothered him on/off ever since.  Pt stating he hasn't been able to work out over the past few months due to recent move into [place].  PERTINENT HISTORY: HTN, severe aortic stenosis, abnormality of gait, h/o gout, rupture of quadriceps tendon on Rt 2018 on left 2018, subdural hematoma 2016, h/o falls, primary OA in bil hands Polio on Left side affecting Left UE strength  PAIN:  NPRS scale:  ranging  pain shoulder arrived 0/10 & ranging over last week 0-8/10, low back arrived 0/10 & ranging over last week 0-6/10 Pain description: achy,  throbbing Aggravating factors: resting, tylenol , voltaren  Relieving factors: Reaching, overhead function, spine flexion and slouched postures  PRECAUTIONS: None  WEIGHT BEARING RESTRICTIONS: No  FALLS:  Has patient fallen in last 6 months? No falls in last few years. Haven't fell since he tore his quad tendons and during a hospital transfer several years ago  LIVING ENVIRONMENT: Lives with: lives with their family and lives with an adult companion Lives in: House/apartment Stairs: Yes: Internal: 17 steps; on left going up and External: 2 steps; on left going up Has following equipment at home: None  OCCUPATION: retired  PLOF: Independent  PATIENT GOALS: Be able to work out at gym with less pain  Next MD visit:   OBJECTIVE:   DIAGNOSTIC FINDINGS: Shoulder: 08/06/23  3 views of the right shoulder including Grashey, scapular  Y and axial view  were ordered and reviewed by myself.  X-rays show severe osteoarthritis of  the right glenohumeral joint.  There is a large inferior spur off the  humeral head.  There is some chondrocalcinosis superior to the humeral  head, likely indicative of pseudogout or prior gout.  No acute bony  fracture noted.  There is bony sclerosis noted of the humeral head and  inferior acetabulum from likely arthritic change.   Knee 05/22/23:   1. Postsurgical changes of quadriceps tendon repair. The tendon appears intact. Resolution of the prior small fluid bright signal within the midsubstance of the quadriceps tendon insertion on prior 10/01/2018 MRI. 2. Stable chronic oblique tear of the anterior horn of the lateral meniscus. Mild degenerative undersurface fraying of the posterior horn of the lateral meniscus. 3. Interval worsening of moderate to severe patellofemoral cartilage degenerative changes. 4. Mildly worsened cartilage loss within the anteromedial aspect of the weight-bearing lateral femoral condyle and adjacent lateral tibial plateau. 5.  Unchanged high-grade partial to full-thickness cartilage loss within the weight-bearing medial femoral condyle.  Lumbar: 05/22/23:  AP lateral radiographs lumbar spine reviewed.  Grade 1-2 spondylolisthesis present at L5-S1 with pars fracture noted at the corresponding level.   Mild to moderate degenerative changes present in the upper and and middle lumbar spine region.  Visualized hips without arthritis    PATIENT SURVEYS:  12/05/23: FOTO intake:  49%  predicted:  51% 12/31/23: FOTO update 48%  02/27/24: FOTO update 57% 04/22/24: FOTO 59.5%  COGNITION: Overall cognitive status: WFL    SENSATION: WFL   POSTURE:  rounded shoulders, forward head, and decreased lumbar lordosis  PALPATION: TTP: distal left quad tendon, Rt upper trap and levator and anterior shoulder  MMT   Right 12/04/23 Left 12/04/23 Rt  / Left 12/18/23 HHD ppsi Right 02/29/24 Rt 03/17/24 Rt 03/26/24 Lt 03/26/24 Rt  04/22/24 Left 04/22/24  Hip flexion 5 5         Hip extension 5 5         Hip abduction 5 5         Hip adduction 5 5         Hip internal rotation           Hip external rotation           Knee flexion 5 5 52.5 / 40.0   64.1 51.0 60.0, 59.9 58.6, 57.6  Knee extension 5 5 54.4 / 65.5    52.1 64.3 74.2, 75.0  66.2, 61.8                  SHOULDER            Shoulder flexion 4 3         Shoulder abduction 4 3         Shoulder ER 4- 3  23.0 pounds 27.4 pounds 28.9 pounds  17.8, 18.5   Shoulder IR 4- 3  33.4 pounds 39.4 pounds 39.6 pounds  38.0, 37.6    (Blank rows = not tested)    ROM  LE Right 12/04/23 Left 12/04/23 Rt  01/22/24 Left 01/22/24 Active supine 02/18/24 Rt  Active standing 02/29/2024 Right  Hip flexion 108 110 112 112 140    Hip extension        Hip abduction        Hip adduction        Hip internal rotation        Hip external rotation  Knee flexion 134 126      Knee extension 0 0       SHOULDER:          Shoulder flexion 138 158 145 155 155 150  Shoulder  abduction 125 140   130   Shoulder ER 30 60   52 50  Shoulder IR      20  Shoulder horizontal adduction      35   (Blank rows = not tested)    FUNCTIONAL TESTS:   12/04/23 : 5 time sit to stand: 17.2 seconds no UE support 01/22/24: 5 time sit to stand: 9.2 seconds no UE support 02/18/24:  10.1 seconds no UE support  GAIT: Distance walked: clinic distances, level surface Assistive device utilized: None Level of assistance: Complete Independence Comments: mild forward flexed trunk posture                                                                                                                                                                        TODAY'S TREATMENT                                                                           DATE:   04/30/2024 Therapeutic Exercise: Reactive / isometric shoulder internal & external rotation green theraband (looped over wrist to protect hand)  10 reps BUEs   gastroc stretch heel depression on step & pt performed 30 sec hold 2 reps BLEs.   alternating BLE heel raises & toe raises with pelvic weight shifts 10 reps 2 sets Seated ankle DF with heel on towel roll for increased range 10 rep 2 sets.  PT added to HEP with HO, demo and verbal cues.  Pt verbalized understanding after performing in clinic.   Neuromuscular Re-education: Tandem stance 30 sec ea 1st rep RLE in front & 2nd rep LLE in front. 1st set on floor eyes open, 2nd set on floor with eyes closed, 3rd set on foam eyes open.  PT educated on HEP with HO & verbal cues. Pt verbalized understanding after completing in clinic.    TREATMENT  DATE:   04/29/2024 Therapeutic Exercise: Reactive / isometric shoulder internal & external rotation green theraband (looped over wrist to protect hand)  10 reps BUEs Pt has RLE foot drop with gait activities.  PT educated in gastroc stretch heel depression on step & pt  performed 30 sec hold 2 reps BLEs.   Then alternating BLE heel raises & toe raises with pelvic weight shifts 10 reps PT added above 2 exercises to HEP with HO.  Pt verbalized understanding after performing in clinic.   NMR:  Sitting on green Swiss ball: (core activation and dynamic stability) 10 reps lateral pelvic shifting bil  Pelvic circles: x 10 bil directions Rows: alternating UEs 5 reps then Bil UE 15 reps blue theraband  Shoulder extension blue theraband - alternating UEs 10 reps & BUEs 10 reps Alternating stepping LE out / back knee ext / flexion 10 reps  Alternating stepping out to side and back in - abd / add 10 reps Marching 10 reps Marching with contralateral UE touching ant knee for counter trunk rotation 10 reps LAQ: x 10 bil LE   TREATMENT                                                                           DATE:   04/22/24 TherEx: Nustep level 8 seat 9 with BLEs/BUEs 8 min.   Seated Rt shoulder ER stretch: x 5 holding 10 sec MMT with HHD performed on bil LE's and Rt shoulder (see chart above) TherActivites:  Leg press: 112# 3 x 10 , single leg press 62# 2 x 10  Leg Press: calf raises 62# bil 2 x 10  Walking front/back with resistive strap around pt's waist x 10  NMR:  Fitter first: circle x 2 minutes with finger tap for support Agility ladder : stepping in/out x4 Stepping side to side x 4 Diagonal stepping x 4       04/15/2024:   TherEx: Nustep level 7 seat 9 with BLEs/BUEs 10 min.   TherActivites:  Leg press: 112# 3 x 10 , single leg press 56# 2 x 10  NMR:  Sitting on green Swiss ball: (core activation and dynamic stability) 10 reps lateral pelvic shifting bil  Pelvic circles: x 10 bil directions Rows: 15 reps blue theraband Bil UE  Shoulder extension blue theraband - alternating UEs 10 reps & BUEs 10 reps Alternating stepping LE out / back knee ext / flexion 10 reps  Alternating stepping out to side and back in - abd / add 10 reps Marching 10  reps Marching with contralateral UE touching ant knee for counter trunk rotation 10 reps LAQ: x 10 bil LE      PATIENT EDUCATION:  Education details: HEP, POC Person educated: Patient Education method: Programmer, multimedia, Demonstration, Verbal cues, and Handouts Education comprehension: verbalized understanding, returned demonstration, and verbal cues required  HOME EXERCISE PROGRAM: AAccess Code: [email or web address] URL: [email or web address] Date: 04/30/2024 Prepared by: Lorie Rook  Exercises - Standing Shoulder Row with Anchored Resistance  - 1 x daily - 7 x weekly - 3 sets - 10 reps - Supine Bridge  - 2 x daily - 7 x weekly - 2 sets - 10 reps - 5 seconds hold - Supine  Shoulder External Rotation in 45 Degrees Abduction AAROM with Dowel  - 1 x daily - 7 x weekly - 3 sets - 10 reps - Supine Shoulder Flexion Extension AAROM with Dowel  - 2-3 x daily - 7 x weekly - 2 sets - 10 reps - Seated Small Alternating Straight Leg Lifts with Heel Touch  - 2 x daily - 7 x weekly - 15-20 reps - Recumbent Bike  - 2-3 x daily - 7 x weekly - Bridge with Hip Abduction and Resistance  - 1-2 x daily - 7 x weekly - 2 sets - 10 reps - Supine Shoulder External Rotation Stretch  - 2 x daily - 7 x weekly - 1 sets - 10-20 reps - 10 seconds hold - Supine Shoulder Internal Rotation  - 2 x daily - 7 x weekly - 1 sets - 10-20 reps - 10 seconds hold - Standing Scapular Retraction  - 5 x daily - 7 x weekly - 1 sets - 5 reps - 5 second hold - Swiss Ball March  - 1 x daily - 7 x weekly - 2 sets - 10 reps - Seated Lateral Pelvic Tilt on Swiss Ball  - 1 x daily - 7 x weekly - 10 reps - Pelvic Circles on [employer]  - 1 x daily - 7 x weekly - 3 sets - 10 reps - Swiss Ball Knee Extension  - 1 x daily - 7 x weekly - 2 sets - 10 reps - Seated Shoulder Extension and Scapular Retraction with Resistance on Swiss Ball  - 1 x daily - 7 x weekly - 2 sets - 10 reps - Seated Shoulder W External Rotation on [employer]  - 1 x  daily - 7 x weekly - 10 reps - Row with Anchored Resistance on [employer]  - 1 x daily - 7 x weekly - 2 sets - 10 reps - Gastroc Stretch on Step  - 1 x daily - 7 x weekly - 1 sets - 3 reps - 30 seconds hold - Heel Toe Raises at [employer]  - 1 x daily - 3-4 x weekly - 1-2 sets - 10 reps - 5 seconds hold - Seated Toe Raise  - 1 x daily - 7 x weekly - 2 sets - 10 reps - 5 seconds hold - Shoulder External Rotation Walkouts with Anchored Resistance  - 1 x daily - 4-5 x weekly - 2 sets - 10 reps - 5 seconds hold - Shoulder Internal Rotation Walkouts with Anchored Resistance  - 1 x daily - 4-5 x weekly - 2 sets - 10 reps - 5 seconds hold - Tandem Stance  - 1 x daily - 4-5 x weekly - 3 sets - 2 reps - 30 seconds hold  ASSESSMENT:  CLINICAL IMPRESSION: Patient appears to understand updated home exercise program for reactive isometric shoulder internal and external rotation, gastroc stretch and ankle exercises.  Patient continues to benefit from skilled PT.  OBJECTIVE IMPAIRMENTS: difficulty walking, decreased ROM, decreased strength, increased edema, impaired flexibility, impaired UE functional use, postural dysfunction, and pain.   ACTIVITY LIMITATIONS: lifting, bending, standing, squatting, sleeping, stairs, transfers, reach over head, and hygiene/grooming  PARTICIPATION LIMITATIONS: cleaning, community activity, and yard work  PERSONAL FACTORS: 3+ comorbidities: see pertinent history are also affecting patient's functional outcome.   REHAB POTENTIAL: Good  CLINICAL DECISION MAKING: Evolving/moderate complexity  EVALUATION COMPLEXITY: High   GOALS: Goals reviewed with patient? Yes  SHORT TERM GOALS: (target date  for Short term goals are 3 weeks 12/25/2023)   1.  Patient will demonstrate independent use of home exercise program to maintain progress from in clinic treatments.  Goal status:MET 02/18/24  2. Pt to improve his 5 time sit to stand to </=  13  seconds with UE support.   Goal  Status: MET 02/18/24   LONG TERM GOALS: (target dates for all long term goals are 8 weeks 05/21/24)   1. Patient will demonstrate/report pain at worst less than or equal to 2/10 to facilitate minimal limitation in daily activity secondary to pain symptoms.  Goal status: On Going 04/29/2024 (can reach 5-6/10 at times)   2. Patient will demonstrate independent use of home exercise program to facilitate ability to maintain/progress functional gains from skilled physical therapy services.  Goal status: On Going 04/29/2024   3. Patient will demonstrate FOTO outcome > or = 51 % to indicate reduced disability due to condition.  49.5% FOTO 02/18/24 Goal status: On Going 04/29/2024   4.  Patient will be able to perform up and down 1 flight of stairs with reciprocal gait pattern with left knee pain and low back pain </= 2/10.   Goal status: On Going 04/29/2024   5.  Patient will demonstrate improved Rt UE flexion to >/= 150 degrees in order to improve functional mobility.   Goal status: Met 02/29/2024   6.  Pt will improve Rt shoulder ER to >/= 60 deg in order to improve functional mobility.  Goal status: On Going   04/29/2024      PLAN:  PT FREQUENCY: 1-2x/week  PT DURATION: 6 weeks  PLANNED INTERVENTIONS: Can include 16109- PT Re-evaluation, 97110-Therapeutic exercises, 97530- Therapeutic activity, 97112- Neuromuscular re-education, 97535- Self Care, 97140- Manual therapy, [phone]- Gait training, [phone]- Orthotic Fit/training, [phone]- Canalith repositioning, [id number]- Aquatic Therapy, 97014- Electrical stimulation (unattended), [id number]- Electrical stimulation (manual), [id number]- Vasopneumatic device, [id number]- Ultrasound, [id number]- Traction (mechanical), [id number]- Ionotophoresis 4mg /ml Dexamethasone , Patient/Family education, Balance training, Stair training, Taping, Dry Needling, Joint mobilization, Joint manipulation, Spinal manipulation, Spinal mobilization, Scar mobilization, Vestibular training, Visual/preceptual  remediation/compensation, DME instructions, Cryotherapy, and Moist heat.  All performed as medically necessary.  All included unless contraindicated  PLAN FOR NEXT SESSION:   Measure active dorsiflexion BLEs.  continue with shoulder and back exercises, capsular stretching, body mechanics, low back and postural strengthening, try percussion or STM as needed  Re-certification: 03/26/24 for 8 weeks 1-2x/ week   Lorie Rook, PT, DPT 04/30/2024, 4:07 PM

## 2024-04-30 NOTE — Therapy (Signed)
 OUTPATIENT OCCUPATIONAL THERAPY ORTHO EVALUATION  Patient Name: Jose [name] MRN: [id number] DOB:19-Apr-1951, 73 y.o., male Today's Date: 05/01/2024  PCP: Elisabeth Guild, MD REFERRING PROVIDER: Nicholas Bari, MD   END OF SESSION:  OT End of Session - 05/01/24 1344     Visit Number 1    Number of Visits 10    Date for OT Re-Evaluation 06/13/24    Authorization Type Medicare    OT Start Time 1345    OT Stop Time 1430    OT Time Calculation (min) 45 min    Activity Tolerance Patient tolerated treatment well;No increased pain;Patient limited by pain;Patient limited by fatigue    Behavior During Therapy [facility] for tasks assessed/performed             Past Medical History:  Diagnosis Date   Arthritis    R shoulder, great toes, hands- has gout & has injections in knees with Dr. Alvira Josephs    Bicuspid aortic valve with ascending aorta 4.0 to 4.5 cm in diameter    CAD (coronary artery disease)    Cancer (HCC)    skin ca-    GERD (gastroesophageal reflux disease)    Headache    History of hiatal hernia    Hypertension    Prostate cancer (HCC)    S/P TAVR (transcatheter aortic valve replacement)    SDH (subdural hematoma) (HCC)    Severe aortic stenosis    Thoracic ascending aortic aneurysm (HCC)    Vertebral artery aneurysm [facility])    Past Surgical History:  Procedure Laterality Date   BRAIN SURGERY  2016   evacuation of SDH   CARDIAC CATHETERIZATION     CARPAL TUNNEL RELEASE Bilateral    embolization of left vertebral artery aneurysm with pipeline/coil  10/31/2022   GREEN LIGHT LASER TURP (TRANSURETHRAL RESECTION OF PROSTATE  04/27/2017   KNEE ARTHROPLASTY     LEFT HEART CATH AND CORONARY ANGIOGRAPHY N/A 10/08/2019   Procedure: LEFT HEART CATH AND CORONARY ANGIOGRAPHY;  Surgeon: Arnoldo Lapping, MD;  Location: [facility] INVASIVE CV LAB;  Service: Cardiovascular;  Laterality: N/A;   QUADRICEPS TENDON REPAIR Bilateral 06/15/2017   Procedure: REPAIR QUADRICEP TENDON;   Surgeon: Jasmine Mesi, MD;  Location: [facility] OR;  Service: Orthopedics;  Laterality: Bilateral;   SHOULDER SURGERY Right    TEE WITHOUT CARDIOVERSION N/A 07/03/2022   Procedure: TRANSESOPHAGEAL ECHOCARDIOGRAM (TEE);  Surgeon: Loyde Rule, MD;  Location: [facility] ENDOSCOPY;  Service: Cardiovascular;  Laterality: N/A;   TRANSCATHETER AORTIC VALVE REPLACEMENT, TRANSFEMORAL  12/23/2019   TRANSCATHETER AORTIC VALVE REPLACEMENT, TRANSFEMORAL N/A 12/23/2019   Procedure: TRANSCATHETER AORTIC VALVE REPLACEMENT, TRANSFEMORAL;  Surgeon: Arnoldo Lapping, MD;  Location: [facility] OR;  Service: Open Heart Surgery;  Laterality: N/A;   VASCULAR SURGERY     Patient Active Problem List   Diagnosis Date Noted   Hypertension    Severe aortic stenosis 10/08/2019   Candidiasis    Abnormality of gait    Hypoalbuminemia due to protein-calorie malnutrition [facility])    History of gout    Sleep disturbance    Constipation    Rupture of quadriceps tendon, right, sequela 06/19/2017   Quadriceps tendon rupture, left, sequela 06/19/2017   Acute blood loss anemia 06/19/2017   Fall    History of subdural hematoma    Post-operative pain    Recurrent falls    Quadriceps tendon rupture 06/15/2017   Primary osteoarthritis of both knees 02/28/2017   Primary osteoarthritis of both hands 02/28/2017   Uricacidemia 02/28/2017   Pain  in joint of left knee 02/07/2017   Idiopathic chronic gout, unspecified site, without tophus (tophi) 11/29/2016   HEMATURIA UNSPECIFIED 01/25/2009   Abdominal pain 01/25/2009   XERODERMA 03/10/2008   UNS ADVRS EFF UNS RX MEDICINAL&BIOLOGICAL SBSTNC 03/10/2008   ADJUSTMENT REACTION WITH PHYSICAL SYMPTOMS 02/14/2008   MUSCLE SPASM 02/14/2008   ACUTE PROSTATITIS 12/16/2007   BREAST LUMP OR MASS, RIGHT 07/12/2007   H/O: RCT (rotator cuff tear) 01/13/2007   GOUT 01/08/2007    ONSET DATE: ~6 weeks onset pain  REFERRING DIAG:  M25.531 (ICD-10-CM) - Pain in right wrist  M25.521 (ICD-10-CM) - Pain  in right elbow  M65.30 (ICD-10-CM) - Trigger finger, unspecified finger, unspecified laterality    THERAPY DIAG:  Pain in right wrist - Plan: Ot plan of care cert/re-cert  Pain in right hand - Plan: Ot plan of care cert/re-cert  Stiffness of right wrist, not elsewhere classified - Plan: Ot plan of care cert/re-cert  Muscle weakness (generalized) - Plan: Ot plan of care cert/re-cert  Other lack of coordination - Plan: Ot plan of care cert/re-cert  Rationale for Evaluation and Treatment: Rehabilitation  SUBJECTIVE:   SUBJECTIVE STATEMENT: He states he has pain near the base of the right thumb for about 6 weeks now.  He also has triggering in his fingers 3 and 4 of bilateral hands chronically.  He has received injections to the base of the thumb hand and the wrist to help with his pain.  He is wearing a prefabricated wrist and thumb spica brace today.  He does workout frequently, trying to stay active and he is going to the gym after this, he states    PERTINENT HISTORY: having problems with: Rt wrist, Rt elbow and trigger finger  currently seeing PT for shoulder pain and problems on the right side  PRECAUTIONS: None  RED FLAGS: None   WEIGHT BEARING RESTRICTIONS: Yes recommended to limit weightbearing through the right wrist and hand to 5 pounds or less for the next few weeks if possible.  PAIN:  Are you having pain? Yes: NPRS scale: none at rest but up to 10/10 in past week at worst  Pain location: Rt thumb CMC J and MCPJs in  Pain description: Sharp and shooting Aggravating factors: Wrist and thumb motion Relieving factors: Unsure  FALLS: Has patient fallen in last 6 months? No  PLOF: Independent  PATIENT GOALS: To improve pain in the right wrist as well as limit triggering and pain through the fingers and increase functional ability  NEXT MD VISIT: As needed   OBJECTIVE: (All objective assessments below are from initial evaluation on: 05/01/24 unless otherwise  specified.)    HAND DOMINANCE: Right   ADLs: Overall ADLs: States decreased ability to grab, hold household objects, pain and difficulty to open containers, perform FMS tasks (manipulate fasteners on clothing)   FUNCTIONAL OUTCOME MEASURES: Eval: Quick DASH 34% impairment today  (Higher % Score  =  More Impairment)     UPPER EXTREMITY ROM     Shoulder to Wrist AROM Right eval Left eval  Shoulder flexion    Shoulder abduction    Shoulder extension    Shoulder internal rotation    Shoulder external rotation    Elbow flexion    Elbow extension    Forearm supination 67 pain   Forearm pronation  85   Wrist flexion 42 72  Wrist extension 60 69  Wrist ulnar deviation    Wrist radial deviation    Functional dart thrower's motion (F-DTM)  in ulnar flexion    F-DTM in radial extension     (Blank rows = not tested)   Hand AROM Right eval Left eval  Full Fist Ability (or Gap to Distal Palmar Crease) Full fist with notable triggering of digits 4 and 3 Full fist with notable triggering of digits 4 and 3  Thumb Opposition  (Kapandji Scale)  9/10 9/10  (Blank rows = not tested)   UPPER EXTREMITY MMT:      MMT Right 05/01/2024  Forearm supination 4 -/5 painful  Forearm pronation 4/5  Wrist flexion 4 -/5 painful  Wrist extension 5/5  Wrist ulnar deviation 5/5  Wrist radial deviation 4 -/5 painful  (Blank rows = not tested)  HAND FUNCTION: Eval: Observed weakness in affected bilateral hands due to triggering and pain.  Strong grip not advised, but this can be tested in the future if he feels better.   COORDINATION: Eval: Observed coordination impairments with affected right wrist and arm due to painful tenosynovitis of the thumb tendons.  Details will be tested as needed in upcoming sessions 9 Hole Peg Test Right: TBD sec, Left: TBD sec (TBD sec is WFL)   SENSATION: Eval:  Light touch intact today  EDEMA:   Eval: No overt swelling today  COGNITION: Eval: Overall  cognitive status: WFL for evaluation today   OBSERVATIONS:   Eval: He has a positive Finkelstein's test on the right wrist and negative on the left wrist.  He is not tender to the [facility] joint of the right thumb.  Supination is tight and painful near the base of the thumb as well.  Today he presents as deQuervain's Tenosynovitis of the right thumb extensor tendons and triggering (or stenosing tenosynovitis) of the bilateral middle fingers and ring fingers.    TODAY'S TREATMENT:  Post-evaluation treatment:   For safety/self-care and management of triggering fingers, OT educates on the anatomy and structural problems related to this painful condition.  OT recommends to do no strong or full gripping activities, avoid repetitious activities, wear Band-Aids around the PIP joints to limit full flexion of those fingers.  This is trialed with him and he states it works to help prevent triggering by limiting tendon excursion.  To help manage apparent deQuervain's tenosynovitis in the right wrist, he was recommended to limit thumb extension and wrist deviation motions.  He was also given the following home exercise program to perform carefully after using moist heat for 3 or 5 minutes over the wrist.  He should do these exercises slowly and without pain about 3-4 times a day.  He can continue to wear his prefabricated thumb and wrist brace if this helps relieve pain and remind him to limit his painful motions.  It may be necessary to make him a custom wrist immobilizer orthosis in the future, if this prefabricated brace is not sufficient to limit his pain.   Exercises - Forearm Supination Stretch  - 3-4 x daily - 3-5 reps - 15 sec hold - Forearm Pronation Stretch  - 3-4 x daily - 3-5 reps - 15 sec hold - Wrist Flexion Stretch  - 4 x daily - 3-5 reps - 15 sec hold - Seated Wrist Extension Stretch  - 3-6 x daily - 3-5 reps - 15 hold - Stretch Thumb DOWNWARD  - 2-3 x daily - 3 reps - 15 sec hold - HOOK Stretch   - 4 x daily - 3-5 reps - 15-20 sec hold   PATIENT EDUCATION: Education details: See  tx section above for details  Person educated: Patient Education method: Verbal Instruction, Teach back, Handouts  Education comprehension: States and demonstrates understanding, Additional Education required    HOME EXERCISE PROGRAM: Access Code: [email or web address] URL: [email or web address] Date: 05/01/2024 Prepared by: Leartis Proud   GOALS: Goals reviewed with patient? Yes   SHORT TERM GOALS: (STG required if POC>30 days) Target Date: 05/09/2024  Pt will obtain protective, custom orthotic. Goal status: TBD/PRN  2.  Pt will demo/state understanding of initial HEP to improve pain levels and prerequisite motion. Goal status: INITIAL   LONG TERM GOALS: Target Date: 06/13/2024  Pt will improve functional ability by decreased impairment per Quick DASH assessment from 34% to 15% or better, for better quality of life. Goal status: INITIAL  2.  Pt will improve grip strength in right hand from weak and painful to at least nonpainful 30 lbs for functional use at home and in IADLs. Goal status: INITIAL  3.  Pt will improve A/ROM in right wrist flexion from painful 42 degrees to at least nonpainful 60 degrees, to have functional motion for tasks like reach and grasp.  Goal status: INITIAL  4.  Pt will improve strength in right wrist radial deviation from painful 4 -/5 MMT to at least 4+/5 MMT to have increased functional ability to carry out selfcare and higher-level homecare tasks with less difficulty. Goal status: INITIAL  5. Pt will decrease pain at worst from 10/10 to 4/10 or better to have better sleep and occupational participation in daily roles. Goal status: INITIAL   ASSESSMENT:  CLINICAL IMPRESSION: Patient is a 73 y.o. male who was seen today for occupational therapy evaluation for right wrist pain thought to be de Quervain's tenosynovitis and also painful triggering of  digits 3 and 4 bilaterally.  He will benefit from outpatient occupational therapy to decrease symptoms and increase quality of life.   PERFORMANCE DEFICITS: in functional skills including ADLs, IADLs, coordination, ROM, strength, pain, fascial restrictions, flexibility, Fine motor control, Gross motor control, body mechanics, endurance, decreased knowledge of precautions, and UE functional use, cognitive skills including problem solving and safety awareness, and psychosocial skills including coping strategies, environmental adaptation, habits, and routines and behaviors.   IMPAIRMENTS: are limiting patient from ADLs, IADLs, rest and sleep, and leisure.   COMORBIDITIES: may have co-morbidities  that affects occupational performance. Patient will benefit from skilled OT to address above impairments and improve overall function.  MODIFICATION OR ASSISTANCE TO COMPLETE EVALUATION: Min-Moderate modification of tasks or assist with assess necessary to complete an evaluation.  OT OCCUPATIONAL PROFILE AND HISTORY: Detailed assessment: Review of records and additional review of physical, cognitive, psychosocial history related to current functional performance.  CLINICAL DECISION MAKING: Moderate - several treatment options, min-mod task modification necessary  REHAB POTENTIAL: Good  EVALUATION COMPLEXITY: Moderate      PLAN:  OT FREQUENCY: 1-2x/week  OT DURATION: 6 weeks through 06/13/2024 and up to 10 total visits as needed  PLANNED INTERVENTIONS: 97535 self care/ADL training, 16109 therapeutic exercise, 97530 therapeutic activity, 97112 neuromuscular re-education, 97140 manual therapy, 97035 ultrasound, 97760 Orthotic Initial, 97763 Orthotic/Prosthetic subsequent, Dry needling, coping strategies training, patient/family education, and DME and/or AE instructions  RECOMMENDED OTHER SERVICES: In PT for shoulder pain and problems currently  CONSULTED AND AGREED WITH PLAN OF CARE: Patient  PLAN  FOR NEXT SESSION:   Review initial home exercise program and recommendations, try manual therapy, kinesiotaping if helpful, consider rigid custom orthosis if needed   Leartis Proud, OTR/L, CHT 05/01/2024, 5:24  PM

## 2024-05-01 ENCOUNTER — Encounter: Payer: Self-pay | Admitting: Rheumatology

## 2024-05-01 ENCOUNTER — Other Ambulatory Visit: Payer: Self-pay | Admitting: Orthopedic Surgery

## 2024-05-01 ENCOUNTER — Ambulatory Visit: Admitting: Rehabilitative and Restorative Service Providers"

## 2024-05-01 ENCOUNTER — Encounter: Payer: Self-pay | Admitting: Rehabilitative and Restorative Service Providers"

## 2024-05-01 DIAGNOSIS — R278 Other lack of coordination: Secondary | ICD-10-CM

## 2024-05-01 DIAGNOSIS — M6281 Muscle weakness (generalized): Secondary | ICD-10-CM

## 2024-05-01 DIAGNOSIS — M25531 Pain in right wrist: Secondary | ICD-10-CM

## 2024-05-01 DIAGNOSIS — M79641 Pain in right hand: Secondary | ICD-10-CM

## 2024-05-01 DIAGNOSIS — M25631 Stiffness of right wrist, not elsewhere classified: Secondary | ICD-10-CM

## 2024-05-01 NOTE — Telephone Encounter (Signed)
 Gout flare is very unlikely with normal uric acid level.  The arthritis is in the [facility] joint which can be painful.  Patient enjoys gardening and does heavy lifting.  Which exacerbates his CMC joint.  If he would like I can refer him to a hand specialist.

## 2024-05-02 NOTE — Therapy (Signed)
 OUTPATIENT OCCUPATIONAL THERAPY TREATMENT NOTE  Patient Name: Jose [name] MRN: [id number] DOB:July 01, 1951, 73 y.o., male Today's Date: 05/05/2024  PCP: Elisabeth Guild, MD REFERRING PROVIDER: Nicholas Bari, MD   END OF SESSION:  OT End of Session - 05/05/24 1611     Visit Number 2    Number of Visits 10    Date for OT Re-Evaluation 06/13/24    Authorization Type Medicare    OT Start Time 1610    OT Stop Time 1654    OT Time Calculation (min) 44 min    Equipment Utilized During Treatment Orthotic materials    Activity Tolerance Patient tolerated treatment well;No increased pain;Patient limited by pain;Patient limited by fatigue    Behavior During Therapy [facility] for tasks assessed/performed              Past Medical History:  Diagnosis Date   Arthritis    R shoulder, great toes, hands- has gout & has injections in knees with Dr. Alvira Josephs    Bicuspid aortic valve with ascending aorta 4.0 to 4.5 cm in diameter    CAD (coronary artery disease)    Cancer (HCC)    skin ca-    GERD (gastroesophageal reflux disease)    Headache    History of hiatal hernia    Hypertension    Prostate cancer (HCC)    S/P TAVR (transcatheter aortic valve replacement)    SDH (subdural hematoma) (HCC)    Severe aortic stenosis    Thoracic ascending aortic aneurysm (HCC)    Vertebral artery aneurysm [facility])    Past Surgical History:  Procedure Laterality Date   BRAIN SURGERY  2016   evacuation of SDH   CARDIAC CATHETERIZATION     CARPAL TUNNEL RELEASE Bilateral    embolization of left vertebral artery aneurysm with pipeline/coil  10/31/2022   GREEN LIGHT LASER TURP (TRANSURETHRAL RESECTION OF PROSTATE  04/27/2017   KNEE ARTHROPLASTY     LEFT HEART CATH AND CORONARY ANGIOGRAPHY N/A 10/08/2019   Procedure: LEFT HEART CATH AND CORONARY ANGIOGRAPHY;  Surgeon: Arnoldo Lapping, MD;  Location: [facility] INVASIVE CV LAB;  Service: Cardiovascular;  Laterality: N/A;   QUADRICEPS TENDON REPAIR Bilateral  06/15/2017   Procedure: REPAIR QUADRICEP TENDON;  Surgeon: Jasmine Mesi, MD;  Location: [facility] OR;  Service: Orthopedics;  Laterality: Bilateral;   SHOULDER SURGERY Right    TEE WITHOUT CARDIOVERSION N/A 07/03/2022   Procedure: TRANSESOPHAGEAL ECHOCARDIOGRAM (TEE);  Surgeon: Loyde Rule, MD;  Location: [facility] ENDOSCOPY;  Service: Cardiovascular;  Laterality: N/A;   TRANSCATHETER AORTIC VALVE REPLACEMENT, TRANSFEMORAL  12/23/2019   TRANSCATHETER AORTIC VALVE REPLACEMENT, TRANSFEMORAL N/A 12/23/2019   Procedure: TRANSCATHETER AORTIC VALVE REPLACEMENT, TRANSFEMORAL;  Surgeon: Arnoldo Lapping, MD;  Location: [facility] OR;  Service: Open Heart Surgery;  Laterality: N/A;   VASCULAR SURGERY     Patient Active Problem List   Diagnosis Date Noted   Hypertension    Severe aortic stenosis 10/08/2019   Candidiasis    Abnormality of gait    Hypoalbuminemia due to protein-calorie malnutrition [facility])    History of gout    Sleep disturbance    Constipation    Rupture of quadriceps tendon, right, sequela 06/19/2017   Quadriceps tendon rupture, left, sequela 06/19/2017   Acute blood loss anemia 06/19/2017   Fall    History of subdural hematoma    Post-operative pain    Recurrent falls    Quadriceps tendon rupture 06/15/2017   Primary osteoarthritis of both knees 02/28/2017   Primary osteoarthritis of  both hands 02/28/2017   Uricacidemia 02/28/2017   Pain in joint of left knee 02/07/2017   Idiopathic chronic gout, unspecified site, without tophus (tophi) 11/29/2016   HEMATURIA UNSPECIFIED 01/25/2009   Abdominal pain 01/25/2009   XERODERMA 03/10/2008   UNS ADVRS EFF UNS RX MEDICINAL&BIOLOGICAL SBSTNC 03/10/2008   ADJUSTMENT REACTION WITH PHYSICAL SYMPTOMS 02/14/2008   MUSCLE SPASM 02/14/2008   ACUTE PROSTATITIS 12/16/2007   BREAST LUMP OR MASS, RIGHT 07/12/2007   H/O: RCT (rotator cuff tear) 01/13/2007   GOUT 01/08/2007    ONSET DATE: ~6 weeks onset pain  REFERRING DIAG:  M25.531 (ICD-10-CM) -  Pain in right wrist  M25.521 (ICD-10-CM) - Pain in right elbow  M65.30 (ICD-10-CM) - Trigger finger, unspecified finger, unspecified laterality    THERAPY DIAG:  Pain in right wrist  Pain in right hand  Stiffness of right wrist, not elsewhere classified  Muscle weakness (generalized)  Other lack of coordination  Rationale for Evaluation and Treatment: Rehabilitation  PERTINENT HISTORY: having problems with: Rt wrist, Rt elbow and trigger finger  currently seeing PT for shoulder pain and problems on the right side he has pain near the base of the right thumb for about 6 weeks now.  He also has triggering in his fingers 3 and 4 of bilateral hands chronically.  He has received injections to the base of the thumb hand and the wrist to help with his pain.  He is wearing a prefabricated wrist and thumb spica brace today.  He does workout frequently, trying to stay active and he is going to the gym after this, he states   PRECAUTIONS: None  RED FLAGS: None   WEIGHT BEARING RESTRICTIONS: Yes recommended to limit weightbearing through the right wrist and hand to 5 pounds or less for the next few weeks if possible.    SUBJECTIVE:   SUBJECTIVE STATEMENT: He states he feels his fingers are moving better, and triggering fingers are less painful and less often now, but his thumb feels "brutal."  He states sharp pains going into his hand and thumb and not tolerating stretches well.     PAIN:  Are you having pain? Yes: NPRS scale: 7-8/10 with thumb motion today  Pain location: Rt thumb CMC J and MCPJs in  Pain description: Sharp and shooting Aggravating factors: Wrist and thumb motion Relieving factors: Unsure  FALLS: Has patient fallen in last 6 months? No  PLOF: Independent  PATIENT GOALS: To improve pain in the right wrist as well as limit triggering and pain through the fingers and increase functional ability  NEXT MD VISIT: As needed   OBJECTIVE: (All objective  assessments below are from initial evaluation on: 05/01/24 unless otherwise specified.)    HAND DOMINANCE: Right   ADLs: Overall ADLs: States decreased ability to grab, hold household objects, pain and difficulty to open containers, perform FMS tasks (manipulate fasteners on clothing)   FUNCTIONAL OUTCOME MEASURES: Eval: Quick DASH 34% impairment today  (Higher % Score  =  More Impairment)     UPPER EXTREMITY ROM     Shoulder to Wrist AROM Right eval Left eval  Shoulder flexion    Shoulder abduction    Shoulder extension    Shoulder internal rotation    Shoulder external rotation    Elbow flexion    Elbow extension    Forearm supination 67 pain   Forearm pronation  85   Wrist flexion 42 72  Wrist extension 60 69  Wrist ulnar deviation    Wrist  radial deviation    Functional dart thrower's motion (F-DTM) in ulnar flexion    F-DTM in radial extension     (Blank rows = not tested)   Hand AROM Right eval Left eval  Full Fist Ability (or Gap to Distal Palmar Crease) Full fist with notable triggering of digits 4 and 3 Full fist with notable triggering of digits 4 and 3  Thumb Opposition  (Kapandji Scale)  9/10 9/10  (Blank rows = not tested)   UPPER EXTREMITY MMT:      MMT Right 05/01/2024  Forearm supination 4 -/5 painful  Forearm pronation 4/5  Wrist flexion 4 -/5 painful  Wrist extension 5/5  Wrist ulnar deviation 5/5  Wrist radial deviation 4 -/5 painful  (Blank rows = not tested)  HAND FUNCTION: Eval: Observed weakness in affected bilateral hands due to triggering and pain.  Strong grip not advised, but this can be tested in the future if he feels better.   COORDINATION: Eval: Observed coordination impairments with affected right wrist and arm due to painful tenosynovitis of the thumb tendons.  Details will be tested as needed in upcoming sessions 9 Hole Peg Test Right: TBD sec, Left: TBD sec (TBD sec is [facility])    OBSERVATIONS:   Eval: He has a positive  Finkelstein's test on the right wrist and negative on the left wrist.  He is not tender to the [facility] joint of the right thumb.  Supination is tight and painful near the base of the thumb as well.  Today he presents as deQuervain's Tenosynovitis of the right thumb extensor tendons and triggering (or stenosing tenosynovitis) of the bilateral middle fingers and ring fingers.    TODAY'S TREATMENT:  05/05/24: Due to not tolerating stretches to the thumb, OT recommends he withhold these for several days to a week, and also his prefabricated brace is not doing enough to protect him and support this tenosynovitis at the wrist and thumb.  A custom orthosis was indicated to totally immobilize the base of the thumb and the wrist today.  OT custom fabricates a forearm-based thumb spica orthosis with IP joint free, and it fit well, he states is not irritating, he shows ability to put it on and off.  Next, OT reviews his home exercises with him and he does tolerate forearm and wrist stretches without any significant pain feeling a light stretch.  OT encourages him to stretch very slowly and carefully to not cause pain.  Thumb stretches were sharply painful, so these were discharged for now and he was told to not do these things either.  He should wear his new orthosis day and night, removing it 4 times a day for light wrist retches to prevent stiffness.  He states understanding all directions and will be seen again on Wednesday, when orthosis will be checked as needed and we will check his pain levels again.  Exercises reviewed today: - Forearm Supination Stretch  - 3-4 x daily - 3-5 reps - 15 sec hold - Forearm Pronation Stretch  - 3-4 x daily - 3-5 reps - 15 sec hold - Wrist Flexion Stretch  - 4 x daily - 3-5 reps - 15 sec hold - Seated Wrist Extension Stretch  - 3-6 x daily - 3-5 reps - 15 hold - HOOK Stretch  - 4 x daily - 3-5 reps - 15-20 sec hold   PATIENT EDUCATION: Education details: See tx section above for  details  Person educated: Patient Education method: Verbal Instruction, Teach  back, Handouts  Education comprehension: States and demonstrates understanding, Additional Education required    HOME EXERCISE PROGRAM: Access Code: [email or web address] URL: [email or web address] Date: 05/01/2024 Prepared by: Leartis Proud   GOALS: Goals reviewed with patient? Yes   SHORT TERM GOALS: (STG required if POC>30 days) Target Date: 05/09/2024  Pt will obtain protective, custom orthotic. Goal status: 05/05/2024: Met  2.  Pt will demo/state understanding of initial HEP to improve pain levels and prerequisite motion. Goal status: INITIAL   LONG TERM GOALS: Target Date: 06/13/2024  Pt will improve functional ability by decreased impairment per Quick DASH assessment from 34% to 15% or better, for better quality of life. Goal status: INITIAL  2.  Pt will improve grip strength in right hand from weak and painful to at least nonpainful 30 lbs for functional use at home and in IADLs. Goal status: INITIAL  3.  Pt will improve A/ROM in right wrist flexion from painful 42 degrees to at least nonpainful 60 degrees, to have functional motion for tasks like reach and grasp.  Goal status: INITIAL  4.  Pt will improve strength in right wrist radial deviation from painful 4 -/5 MMT to at least 4+/5 MMT to have increased functional ability to carry out selfcare and higher-level homecare tasks with less difficulty. Goal status: INITIAL  5. Pt will decrease pain at worst from 10/10 to 4/10 or better to have better sleep and occupational participation in daily roles. Goal status: INITIAL   ASSESSMENT:  CLINICAL IMPRESSION: 05/05/24: He was apparently doing his stretches too harshly and was causing himself pain, most of which he could do during the session today without pain after retraining.  A custom rigid orthosis was also necessary to help rest the APL and EPB tendons.  Hopefully intermittent  stretching will prevent excess stiffness from occurring due to immobilization.    PLAN:  OT FREQUENCY: 1-2x/week  OT DURATION: 6 weeks through 06/13/2024 and up to 10 total visits as needed  PLANNED INTERVENTIONS: 97535 self care/ADL training, 47425 therapeutic exercise, 97530 therapeutic activity, 97112 neuromuscular re-education, 97140 manual therapy, 97035 ultrasound, 97760 Orthotic Initial, 97763 Orthotic/Prosthetic subsequent, Dry needling, coping strategies training, patient/family education, and DME and/or AE instructions  RECOMMENDED OTHER SERVICES: In PT for shoulder pain and problems currently  CONSULTED AND AGREED WITH PLAN OF CARE: Patient  PLAN FOR NEXT SESSION:   Check orthosis as needed, continue stretching and pain relief techniques.   Leartis Proud, OTR/L, CHT 05/05/2024, 5:33 PM

## 2024-05-05 ENCOUNTER — Ambulatory Visit: Admitting: Rehabilitative and Restorative Service Providers"

## 2024-05-05 DIAGNOSIS — M6281 Muscle weakness (generalized): Secondary | ICD-10-CM | POA: Diagnosis not present

## 2024-05-05 DIAGNOSIS — M79641 Pain in right hand: Secondary | ICD-10-CM

## 2024-05-05 DIAGNOSIS — M25631 Stiffness of right wrist, not elsewhere classified: Secondary | ICD-10-CM

## 2024-05-05 DIAGNOSIS — M25531 Pain in right wrist: Secondary | ICD-10-CM

## 2024-05-05 DIAGNOSIS — R278 Other lack of coordination: Secondary | ICD-10-CM

## 2024-05-06 ENCOUNTER — Ambulatory Visit (INDEPENDENT_AMBULATORY_CARE_PROVIDER_SITE_OTHER): Admitting: Physical Therapy

## 2024-05-06 ENCOUNTER — Encounter: Payer: Self-pay | Admitting: Physical Therapy

## 2024-05-06 DIAGNOSIS — M25562 Pain in left knee: Secondary | ICD-10-CM | POA: Diagnosis not present

## 2024-05-06 DIAGNOSIS — M6281 Muscle weakness (generalized): Secondary | ICD-10-CM | POA: Diagnosis not present

## 2024-05-06 DIAGNOSIS — M25511 Pain in right shoulder: Secondary | ICD-10-CM

## 2024-05-06 DIAGNOSIS — M5459 Other low back pain: Secondary | ICD-10-CM | POA: Diagnosis not present

## 2024-05-06 DIAGNOSIS — G8929 Other chronic pain: Secondary | ICD-10-CM

## 2024-05-06 NOTE — Therapy (Signed)
 OUTPATIENT OCCUPATIONAL THERAPY TREATMENT NOTE  Patient Name: Jose [name] MRN: [id number] DOB:[birth year], 73 y.o., male Today's Date: 05/07/2024  PCP: Elisabeth Guild, MD REFERRING PROVIDER: Nicholas Bari, MD   END OF SESSION:  OT End of Session - 05/07/24 4696     Visit Number 3    Number of Visits 10    Date for OT Re-Evaluation 06/13/24    Authorization Type Medicare    OT Start Time 2952    OT Stop Time 1006    OT Time Calculation (min) 38 min    Activity Tolerance Patient tolerated treatment well;No increased pain;Patient limited by pain;Patient limited by fatigue    Behavior During Therapy [facility] for tasks assessed/performed               Past Medical History:  Diagnosis Date   Arthritis    R shoulder, great toes, hands- has gout & has injections in knees with Dr. Alvira Josephs    Bicuspid aortic valve with ascending aorta 4.0 to 4.5 cm in diameter    CAD (coronary artery disease)    Cancer (HCC)    skin ca-    GERD (gastroesophageal reflux disease)    Headache    History of hiatal hernia    Hypertension    Prostate cancer (HCC)    S/P TAVR (transcatheter aortic valve replacement)    SDH (subdural hematoma) (HCC)    Severe aortic stenosis    Thoracic ascending aortic aneurysm (HCC)    Vertebral artery aneurysm [facility])    Past Surgical History:  Procedure Laterality Date   BRAIN SURGERY  2016   evacuation of SDH   CARDIAC CATHETERIZATION     CARPAL TUNNEL RELEASE Bilateral    embolization of left vertebral artery aneurysm with pipeline/coil  10/31/2022   GREEN LIGHT LASER TURP (TRANSURETHRAL RESECTION OF PROSTATE  04/27/2017   KNEE ARTHROPLASTY     LEFT HEART CATH AND CORONARY ANGIOGRAPHY N/A 10/08/2019   Procedure: LEFT HEART CATH AND CORONARY ANGIOGRAPHY;  Surgeon: Arnoldo Lapping, MD;  Location: [facility] INVASIVE CV LAB;  Service: Cardiovascular;  Laterality: N/A;   QUADRICEPS TENDON REPAIR Bilateral 06/15/2017   Procedure: REPAIR QUADRICEP TENDON;   Surgeon: Jasmine Mesi, MD;  Location: [facility] OR;  Service: Orthopedics;  Laterality: Bilateral;   SHOULDER SURGERY Right    TEE WITHOUT CARDIOVERSION N/A 07/03/2022   Procedure: TRANSESOPHAGEAL ECHOCARDIOGRAM (TEE);  Surgeon: Loyde Rule, MD;  Location: [facility] ENDOSCOPY;  Service: Cardiovascular;  Laterality: N/A;   TRANSCATHETER AORTIC VALVE REPLACEMENT, TRANSFEMORAL  12/23/2019   TRANSCATHETER AORTIC VALVE REPLACEMENT, TRANSFEMORAL N/A 12/23/2019   Procedure: TRANSCATHETER AORTIC VALVE REPLACEMENT, TRANSFEMORAL;  Surgeon: Arnoldo Lapping, MD;  Location: [facility] OR;  Service: Open Heart Surgery;  Laterality: N/A;   VASCULAR SURGERY     Patient Active Problem List   Diagnosis Date Noted   Hypertension    Severe aortic stenosis 10/08/2019   Candidiasis    Abnormality of gait    Hypoalbuminemia due to protein-calorie malnutrition [facility])    History of gout    Sleep disturbance    Constipation    Rupture of quadriceps tendon, right, sequela 06/19/2017   Quadriceps tendon rupture, left, sequela 06/19/2017   Acute blood loss anemia 06/19/2017   Fall    History of subdural hematoma    Post-operative pain    Recurrent falls    Quadriceps tendon rupture 06/15/2017   Primary osteoarthritis of both knees 02/28/2017   Primary osteoarthritis of both hands 02/28/2017   Uricacidemia 02/28/2017  Pain in joint of left knee 02/07/2017   Idiopathic chronic gout, unspecified site, without tophus (tophi) 11/29/2016   HEMATURIA UNSPECIFIED 01/25/2009   Abdominal pain 01/25/2009   XERODERMA 03/10/2008   UNS ADVRS EFF UNS RX MEDICINAL&BIOLOGICAL SBSTNC 03/10/2008   ADJUSTMENT REACTION WITH PHYSICAL SYMPTOMS 02/14/2008   MUSCLE SPASM 02/14/2008   ACUTE PROSTATITIS 12/16/2007   BREAST LUMP OR MASS, RIGHT 07/12/2007   H/O: RCT (rotator cuff tear) 01/13/2007   GOUT 01/08/2007    ONSET DATE: ~6 weeks onset pain  REFERRING DIAG:  M25.531 (ICD-10-CM) - Pain in right wrist  M25.521 (ICD-10-CM) - Pain  in right elbow  M65.30 (ICD-10-CM) - Trigger finger, unspecified finger, unspecified laterality    THERAPY DIAG:  Pain in right wrist  Pain in right hand  Stiffness of right wrist, not elsewhere classified  Other lack of coordination  Muscle weakness (generalized)  Rationale for Evaluation and Treatment: Rehabilitation  PERTINENT HISTORY: having problems with: Rt wrist, Rt elbow and trigger finger  currently seeing PT for shoulder pain and problems on the right side he has pain near the base of the right thumb for about 6 weeks now.  He also has triggering in his fingers 3 and 4 of bilateral hands chronically.  He has received injections to the base of the thumb hand and the wrist to help with his pain.  He is wearing a prefabricated wrist and thumb spica brace today.  He does workout frequently, trying to stay active and he is going to the gym after this, he states   PRECAUTIONS: None  RED FLAGS: None   WEIGHT BEARING RESTRICTIONS: Yes recommended to limit weightbearing through the right wrist and hand to 5 pounds or less for the next few weeks if possible.    SUBJECTIVE:   SUBJECTIVE STATEMENT: He states feeling better wearing his new orthosis and having less pain, now light motion is not very painful like it was the other day.     PAIN:  Are you having pain? Yes: NPRS scale: No significant pain with gentle thumb motion today Pain location: Rt thumb CMC J and MCPJs in  Pain description: Sharp and shooting Aggravating factors: Wrist and thumb motion Relieving factors: Unsure  FALLS: Has patient fallen in last 6 months? No  PLOF: Independent  PATIENT GOALS: To improve pain in the right wrist as well as limit triggering and pain through the fingers and increase functional ability  NEXT MD VISIT: As needed   OBJECTIVE: (All objective assessments below are from initial evaluation on: 05/01/24 unless otherwise specified.)    HAND DOMINANCE: Right    ADLs: Overall ADLs: States decreased ability to grab, hold household objects, pain and difficulty to open containers, perform FMS tasks (manipulate fasteners on clothing)   FUNCTIONAL OUTCOME MEASURES: Eval: Quick DASH 34% impairment today  (Higher % Score  =  More Impairment)     UPPER EXTREMITY ROM     Shoulder to Wrist AROM Right eval Left eval Rt 05/07/24  Shoulder flexion     Shoulder abduction     Shoulder extension     Shoulder internal rotation     Shoulder external rotation     Elbow flexion     Elbow extension     Forearm supination 67 pain    Forearm pronation  85    Wrist flexion 42 72 32  Wrist extension 60 69 50  Wrist ulnar deviation     Wrist radial deviation     Functional dart  thrower's motion (F-DTM) in ulnar flexion     F-DTM in radial extension      (Blank rows = not tested)   Hand AROM Right eval Left eval  Full Fist Ability (or Gap to Distal Palmar Crease) Full fist with notable triggering of digits 4 and 3 Full fist with notable triggering of digits 4 and 3  Thumb Opposition  (Kapandji Scale)  9/10 9/10  (Blank rows = not tested)   UPPER EXTREMITY MMT:      MMT Right 05/01/2024  Forearm supination 4 -/5 painful  Forearm pronation 4/5  Wrist flexion 4 -/5 painful  Wrist extension 5/5  Wrist ulnar deviation 5/5  Wrist radial deviation 4 -/5 painful  (Blank rows = not tested)  HAND FUNCTION: Eval: Observed weakness in affected bilateral hands due to triggering and pain.  Strong grip not advised, but this can be tested in the future if he feels better.   COORDINATION: Eval: Observed coordination impairments with affected right wrist and arm due to painful tenosynovitis of the thumb tendons.  Details will be tested as needed in upcoming sessions 9 Hole Peg Test Right: TBD sec, Left: TBD sec (TBD sec is [facility])    OBSERVATIONS:   Eval: He has a positive Finkelstein's test on the right wrist and negative on the left wrist.  He is not  tender to the [facility] joint of the right thumb.  Supination is tight and painful near the base of the thumb as well.  Today he presents as deQuervain's Tenosynovitis of the right thumb extensor tendons and triggering (or stenosing tenosynovitis) of the bilateral middle fingers and ring fingers.    TODAY'S TREATMENT:  05/07/24: Starts with active range motion which does show some stiffness building up in the wrist from wearing the new rigid orthosis.  This is to be expected, but he was educated to continue to remove the orthosis several times a day to use moist heat, do a light massage, and do very gentle and pain-free stretches to help maintain his range of motion and decrease his symptoms.  He is on moist heat for 3 to 4 minutes while OT is giving him this education.  OT does this with him today, also doing manual therapy IASTM along the length of the tensor pollicis brevis.  He states heat, manual therapy, light stretches are relieving and feel beneficial.  We discussed safety for his upcoming vacation for about a week and a half or 2 weeks.  He should try to avoid weightbearing or carrying luggage and that sort of thing.  He states understanding all directions and leaves feeling better today.   Exercises reviewed/performed today: - Forearm Supination Stretch  - 3-4 x daily - 3-5 reps - 15 sec hold - Forearm Pronation Stretch  - 3-4 x daily - 3-5 reps - 15 sec hold - Wrist Flexion Stretch  - 4 x daily - 3-5 reps - 15 sec hold - Seated Wrist Extension Stretch  - 3-6 x daily - 3-5 reps - 15 hold - HOOK Stretch  - 4 x daily - 3-5 reps - 15-20 sec hold   PATIENT EDUCATION: Education details: See tx section above for details  Person educated: Patient Education method: Verbal Instruction, Teach back, Handouts  Education comprehension: States and demonstrates understanding, Additional Education required    HOME EXERCISE PROGRAM: Access Code: [email or web address] URL: [email or web address] Date:  05/01/2024 Prepared by: Leartis Proud   GOALS: Goals reviewed with patient? Yes   SHORT TERM GOALS: (STG  required if POC>30 days) Target Date: 05/09/2024  Pt will obtain protective, custom orthotic. Goal status: 05/05/2024: Met  2.  Pt will demo/state understanding of initial HEP to improve pain levels and prerequisite motion. Goal status: INITIAL   LONG TERM GOALS: Target Date: 06/13/2024  Pt will improve functional ability by decreased impairment per Quick DASH assessment from 34% to 15% or better, for better quality of life. Goal status: INITIAL  2.  Pt will improve grip strength in right hand from weak and painful to at least nonpainful 30 lbs for functional use at home and in IADLs. Goal status: INITIAL  3.  Pt will improve A/ROM in right wrist flexion from painful 42 degrees to at least nonpainful 60 degrees, to have functional motion for tasks like reach and grasp.  Goal status: INITIAL  4.  Pt will improve strength in right wrist radial deviation from painful 4 -/5 MMT to at least 4+/5 MMT to have increased functional ability to carry out selfcare and higher-level homecare tasks with less difficulty. Goal status: INITIAL  5. Pt will decrease pain at worst from 10/10 to 4/10 or better to have better sleep and occupational participation in daily roles. Goal status: INITIAL   ASSESSMENT:  CLINICAL IMPRESSION: 05/07/24: He is doing better, having less pain with the new orthosis, but still needs to be cautious for several weeks most likely.  05/05/24: He was apparently doing his stretches too harshly and was causing himself pain, most of which he could do during the session today without pain after retraining.  A custom rigid orthosis was also necessary to help rest the APL and EPB tendons.  Hopefully intermittent stretching will prevent excess stiffness from occurring due to immobilization.    PLAN:  OT FREQUENCY: 1-2x/week  OT DURATION: 6 weeks through 06/13/2024  and up to 10 total visits as needed  PLANNED INTERVENTIONS: 97535 self care/ADL training, 16109 therapeutic exercise, 97530 therapeutic activity, 97112 neuromuscular re-education, 97140 manual therapy, 97035 ultrasound, 97760 Orthotic Initial, 97763 Orthotic/Prosthetic subsequent, Dry needling, coping strategies training, patient/family education, and DME and/or AE instructions  RECOMMENDED OTHER SERVICES: In PT for shoulder pain and problems currently  CONSULTED AND AGREED WITH PLAN OF CARE: Patient  PLAN FOR NEXT SESSION:   Follow-up after his vacation to check range of motion and tenderness and try to advance   Leartis Proud, OTR/L, CHT 05/07/2024, 10:13 AM

## 2024-05-06 NOTE — Therapy (Signed)
 OUTPATIENT PHYSICAL THERAPY TREATMENT    Patient Name: Jose [name] MRN: [id number] DOB:December 04, 1951, 73 y.o., male Today's Date: 05/06/2024  END OF SESSION:  PT End of Session - 05/06/24 0941     Visit Number 16    Number of Visits 22    Date for PT Re-Evaluation 05/21/24    Authorization Type Re-certification for 1-2x/ week for up to 8 weeks    PT Start Time 0936    PT Stop Time 1015    PT Time Calculation (min) 39 min    Activity Tolerance Patient tolerated treatment well;No increased pain    Behavior During Therapy WFL for tasks assessed/performed                        Past Medical History:  Diagnosis Date   Arthritis    R shoulder, great toes, hands- has gout & has injections in knees with Dr. Alvira Josephs    Bicuspid aortic valve with ascending aorta 4.0 to 4.5 cm in diameter    CAD (coronary artery disease)    Cancer (HCC)    skin ca-    GERD (gastroesophageal reflux disease)    Headache    History of hiatal hernia    Hypertension    Prostate cancer (HCC)    S/P TAVR (transcatheter aortic valve replacement)    SDH (subdural hematoma) (HCC)    Severe aortic stenosis    Thoracic ascending aortic aneurysm (HCC)    Vertebral artery aneurysm [facility])    Past Surgical History:  Procedure Laterality Date   BRAIN SURGERY  2016   evacuation of SDH   CARDIAC CATHETERIZATION     CARPAL TUNNEL RELEASE Bilateral    embolization of left vertebral artery aneurysm with pipeline/coil  10/31/2022   GREEN LIGHT LASER TURP (TRANSURETHRAL RESECTION OF PROSTATE  04/27/2017   KNEE ARTHROPLASTY     LEFT HEART CATH AND CORONARY ANGIOGRAPHY N/A 10/08/2019   Procedure: LEFT HEART CATH AND CORONARY ANGIOGRAPHY;  Surgeon: Arnoldo Lapping, MD;  Location: [facility] INVASIVE CV LAB;  Service: Cardiovascular;  Laterality: N/A;   QUADRICEPS TENDON REPAIR Bilateral 06/15/2017   Procedure: REPAIR QUADRICEP TENDON;  Surgeon: Jasmine Mesi, MD;  Location: [facility] OR;  Service:  Orthopedics;  Laterality: Bilateral;   SHOULDER SURGERY Right    TEE WITHOUT CARDIOVERSION N/A 07/03/2022   Procedure: TRANSESOPHAGEAL ECHOCARDIOGRAM (TEE);  Surgeon: Loyde Rule, MD;  Location: [facility] ENDOSCOPY;  Service: Cardiovascular;  Laterality: N/A;   TRANSCATHETER AORTIC VALVE REPLACEMENT, TRANSFEMORAL  12/23/2019   TRANSCATHETER AORTIC VALVE REPLACEMENT, TRANSFEMORAL N/A 12/23/2019   Procedure: TRANSCATHETER AORTIC VALVE REPLACEMENT, TRANSFEMORAL;  Surgeon: Arnoldo Lapping, MD;  Location: [facility] OR;  Service: Open Heart Surgery;  Laterality: N/A;   VASCULAR SURGERY     Patient Active Problem List   Diagnosis Date Noted   Hypertension    Severe aortic stenosis 10/08/2019   Candidiasis    Abnormality of gait    Hypoalbuminemia due to protein-calorie malnutrition [facility])    History of gout    Sleep disturbance    Constipation    Rupture of quadriceps tendon, right, sequela 06/19/2017   Quadriceps tendon rupture, left, sequela 06/19/2017   Acute blood loss anemia 06/19/2017   Fall    History of subdural hematoma    Post-operative pain    Recurrent falls    Quadriceps tendon rupture 06/15/2017   Primary osteoarthritis of both knees 02/28/2017   Primary osteoarthritis of both hands 02/28/2017   Uricacidemia 02/28/2017  Pain in joint of left knee 02/07/2017   Idiopathic chronic gout, unspecified site, without tophus (tophi) 11/29/2016   HEMATURIA UNSPECIFIED 01/25/2009   Abdominal pain 01/25/2009   XERODERMA 03/10/2008   UNS ADVRS EFF UNS RX MEDICINAL&BIOLOGICAL SBSTNC 03/10/2008   ADJUSTMENT REACTION WITH PHYSICAL SYMPTOMS 02/14/2008   MUSCLE SPASM 02/14/2008   ACUTE PROSTATITIS 12/16/2007   BREAST LUMP OR MASS, RIGHT 07/12/2007   H/O: RCT (rotator cuff tear) 01/13/2007   GOUT 01/08/2007    PCP: Elisabeth Guild, MD   REFERRING PROVIDER: Nicholas Bari, MD   REFERRING DIAG:  [phone] (ICD-10-CM) - Synovitis of left knee  M19.011 (ICD-10-CM) - Primary osteoarthritis,  right shoulder  M54.50 (ICD-10-CM) - Low back pain, unspecified back pain laterality, unspecified chronicity, unspecified whether sciatica present    THERAPY DIAG:  Other low back pain  Chronic right shoulder pain  Chronic pain of left knee  Muscle weakness (generalized)  Rationale for Evaluation and Treatment: Rehabilitation  ONSET DATE: see subjective, on-going for years  SUBJECTIVE:  SUBJECTIVE STATEMENT: Pt reporting his posterior shoulder is giving him the most trouble today.   EVAL:  Pt stating he has been painting a lot at home and feels it may have exacerbated his Rt shoulder. Pt reporting radiation pain down his arm but no numbness/tingling. Pt stating Rt shoulder is giving him the most problem. Pt stating after getting the second shingles shot his shoulder pain increased into tricep and shoulder. Pt stating pain has been ongoing for about a year. Pt stating pain is worse at night.  Pt stating left knee pain has been ongoing since January 2018 when he was skiing and twisted his knee and he tore his quadriceps tendon. Pt stating he fell and when he fell he tore both tendons in bil LE. Pt then stating the repair didn't take on his left LE and it was repaired again. Pt stating he had PT and he gained ROM back in bil LE.  Pt reporting falling while in hospital during a bed transfer where he landed on his floor on his back in 2018. Pt stating his back has bothered him on/off ever since.  Pt stating he hasn't been able to work out over the past few months due to recent move into [place].  PERTINENT HISTORY: HTN, severe aortic stenosis, abnormality of gait, h/o gout, rupture of quadriceps tendon on Rt 2018 on left 2018, subdural hematoma 2016, h/o falls, primary OA in bil hands Polio on Left side affecting Left UE strength  PAIN:  NPRS scale: 4-5/10 in his Rt shoulder Pain description: achy, throbbing Aggravating factors: resting, tylenol , voltaren  Relieving factors:  Reaching, overhead function, spine flexion and slouched postures  PRECAUTIONS: None  WEIGHT BEARING RESTRICTIONS: No  FALLS:  Has patient fallen in last 6 months? No falls in last few years. Haven't fell since he tore his quad tendons and during a hospital transfer several years ago  LIVING ENVIRONMENT: Lives with: lives with their family and lives with an adult companion Lives in: House/apartment Stairs: Yes: Internal: 17 steps; on left going up and External: 2 steps; on left going up Has following equipment at home: None  OCCUPATION: retired  PLOF: Independent  PATIENT GOALS: Be able to work out at gym with less pain  Next MD visit:   OBJECTIVE:   DIAGNOSTIC FINDINGS: Shoulder: 08/06/23  3 views of the right shoulder including Grashey, scapular Y and axial view  were ordered and reviewed by myself.  X-rays show severe osteoarthritis of  the  right glenohumeral joint.  There is a large inferior spur off the  humeral head.  There is some chondrocalcinosis superior to the humeral  head, likely indicative of pseudogout or prior gout.  No acute bony  fracture noted.  There is bony sclerosis noted of the humeral head and  inferior acetabulum from likely arthritic change.   Knee 05/22/23:   1. Postsurgical changes of quadriceps tendon repair. The tendon appears intact. Resolution of the prior small fluid bright signal within the midsubstance of the quadriceps tendon insertion on prior 10/01/2018 MRI. 2. Stable chronic oblique tear of the anterior horn of the lateral meniscus. Mild degenerative undersurface fraying of the posterior horn of the lateral meniscus. 3. Interval worsening of moderate to severe patellofemoral cartilage degenerative changes. 4. Mildly worsened cartilage loss within the anteromedial aspect of the weight-bearing lateral femoral condyle and adjacent lateral tibial plateau. 5. Unchanged high-grade partial to full-thickness cartilage loss within the  weight-bearing medial femoral condyle.  Lumbar: 05/22/23:  AP lateral radiographs lumbar spine reviewed.  Grade 1-2 spondylolisthesis present at L5-S1 with pars fracture noted at the corresponding level.   Mild to moderate degenerative changes present in the upper and and middle lumbar spine region.  Visualized hips without arthritis    PATIENT SURVEYS:  12/05/23: FOTO intake:  49%  predicted:  51% 12/31/23: FOTO update 48%  02/27/24: FOTO update 57% 04/22/24: FOTO 59.5%  COGNITION: Overall cognitive status: WFL    SENSATION: WFL   POSTURE:  rounded shoulders, forward head, and decreased lumbar lordosis  PALPATION: TTP: distal left quad tendon, Rt upper trap and levator and anterior shoulder  MMT   Right 12/04/23 Left 12/04/23 Rt  / Left 12/18/23 HHD ppsi Right 02/29/24 Rt 03/17/24 Rt 03/26/24 Lt 03/26/24 Rt  04/22/24 Left 04/22/24  Hip flexion 5 5         Hip extension 5 5         Hip abduction 5 5         Hip adduction 5 5         Hip internal rotation           Hip external rotation           Knee flexion 5 5 52.5 / 40.0   64.1 51.0 60.0, 59.9 58.6, 57.6  Knee extension 5 5 54.4 / 65.5    52.1 64.3 74.2, 75.0  66.2, 61.8                  SHOULDER            Shoulder flexion 4 3         Shoulder abduction 4 3         Shoulder ER 4- 3  23.0 pounds 27.4 pounds 28.9 pounds  17.8, 18.5   Shoulder IR 4- 3  33.4 pounds 39.4 pounds 39.6 pounds  38.0, 37.6    (Blank rows = not tested)    ROM  LE Right 12/04/23 Left 12/04/23 Rt  01/22/24 Left 01/22/24 Active supine 02/18/24 Rt  Active standing 02/29/2024 Right  Hip flexion 108 110 112 112 140    Hip extension        Hip abduction        Hip adduction        Hip internal rotation        Hip external rotation        Knee flexion 134 126      Knee extension 0 0  SHOULDER:          Shoulder flexion 138 158 145 155 155 150  Shoulder abduction 125 140   130   Shoulder ER 30 60   52 50  Shoulder IR      20   Shoulder horizontal adduction      35   (Blank rows = not tested)    FUNCTIONAL TESTS:   12/04/23 : 5 time sit to stand: 17.2 seconds no UE support 01/22/24: 5 time sit to stand: 9.2 seconds no UE support 02/18/24:  10.1 seconds no UE support  GAIT: Distance walked: clinic distances, level surface Assistive device utilized: None Level of assistance: Complete Independence Comments: mild forward flexed trunk posture                                                                                                                                                                       TODAY'S TREATMENT                                                                           DATE:   05/06/2024 Therapeutic Exercise: Nustep; x 10 minutes level 6 UE/LE Hip hiking at door way x 10 bil LE Standing wall extension elbow on the wall x 10 holding 5-10 sec each TherActivites:  Leg Press: double: 106# 2 x 15, single 56# 2 x 10  Standing on Ariex mat: staggered stance bil x 60 sec Side stepping green TB around knees 20 feet x 4  Manual Percussion device to pt's infraspinatus, sub scapulae and tricep on Rt UE x 8 minutes      TODAY'S TREATMENT                                                                           DATE:   04/30/2024 Therapeutic Exercise: Reactive / isometric shoulder internal & external rotation green theraband (looped over wrist to protect hand)  10 reps BUEs   gastroc stretch heel depression on step & pt performed 30 sec hold 2 reps BLEs.   alternating BLE heel raises & toe raises with pelvic weight shifts 10 reps 2 sets Seated ankle DF with heel on towel  roll for increased range 10 rep 2 sets.  PT added to HEP with HO, demo and verbal cues.  Pt verbalized understanding after performing in clinic.   Neuromuscular Re-education: Tandem stance 30 sec ea 1st rep RLE in front & 2nd rep LLE in front. 1st set on floor eyes open, 2nd set on floor with eyes closed, 3rd set on foam  eyes open.  PT educated on HEP with HO & verbal cues. Pt verbalized understanding after completing in clinic.    TREATMENT                                                                           DATE:   04/29/2024 Therapeutic Exercise: Reactive / isometric shoulder internal & external rotation green theraband (looped over wrist to protect hand)  10 reps BUEs Pt has RLE foot drop with gait activities.  PT educated in gastroc stretch heel depression on step & pt performed 30 sec hold 2 reps BLEs.   Then alternating BLE heel raises & toe raises with pelvic weight shifts 10 reps PT added above 2 exercises to HEP with HO.  Pt verbalized understanding after performing in clinic.   NMR:  Sitting on green Swiss ball: (core activation and dynamic stability) 10 reps lateral pelvic shifting bil  Pelvic circles: x 10 bil directions Rows: alternating UEs 5 reps then Bil UE 15 reps blue theraband  Shoulder extension blue theraband - alternating UEs 10 reps & BUEs 10 reps Alternating stepping LE out / back knee ext / flexion 10 reps  Alternating stepping out to side and back in - abd / add 10 reps Marching 10 reps Marching with contralateral UE touching ant knee for counter trunk rotation 10 reps LAQ: x 10 bil LE     PATIENT EDUCATION:  Education details: HEP, POC Person educated: Patient Education method: Programmer, multimedia, Demonstration, Verbal cues, and Handouts Education comprehension: verbalized understanding, returned demonstration, and verbal cues required  HOME EXERCISE PROGRAM: AAccess Code: [email or web address] URL: [email or web address] Date: 04/30/2024 Prepared by: Lorie Rook  Exercises - Standing Shoulder Row with Anchored Resistance  - 1 x daily - 7 x weekly - 3 sets - 10 reps - Supine Bridge  - 2 x daily - 7 x weekly - 2 sets - 10 reps - 5 seconds hold - Supine Shoulder External Rotation in 45 Degrees Abduction AAROM with Dowel  - 1 x daily - 7 x weekly - 3 sets - 10 reps -  Supine Shoulder Flexion Extension AAROM with Dowel  - 2-3 x daily - 7 x weekly - 2 sets - 10 reps - Seated Small Alternating Straight Leg Lifts with Heel Touch  - 2 x daily - 7 x weekly - 15-20 reps - Recumbent Bike  - 2-3 x daily - 7 x weekly - Bridge with Hip Abduction and Resistance  - 1-2 x daily - 7 x weekly - 2 sets - 10 reps - Supine Shoulder External Rotation Stretch  - 2 x daily - 7 x weekly - 1 sets - 10-20 reps - 10 seconds hold - Supine Shoulder Internal Rotation  - 2 x daily - 7 x weekly - 1 sets - 10-20 reps - 10 seconds hold - Standing  Scapular Retraction  - 5 x daily - 7 x weekly - 1 sets - 5 reps - 5 second hold - Swiss Ball March  - 1 x daily - 7 x weekly - 2 sets - 10 reps - Seated Lateral Pelvic Tilt on Swiss Ball  - 1 x daily - 7 x weekly - 10 reps - Pelvic Circles on [employer]  - 1 x daily - 7 x weekly - 3 sets - 10 reps - Swiss Ball Knee Extension  - 1 x daily - 7 x weekly - 2 sets - 10 reps - Seated Shoulder Extension and Scapular Retraction with Resistance on Swiss Ball  - 1 x daily - 7 x weekly - 2 sets - 10 reps - Seated Shoulder W External Rotation on [employer]  - 1 x daily - 7 x weekly - 10 reps - Row with Anchored Resistance on [employer]  - 1 x daily - 7 x weekly - 2 sets - 10 reps - Gastroc Stretch on Step  - 1 x daily - 7 x weekly - 1 sets - 3 reps - 30 seconds hold - Heel Toe Raises at [employer]  - 1 x daily - 3-4 x weekly - 1-2 sets - 10 reps - 5 seconds hold - Seated Toe Raise  - 1 x daily - 7 x weekly - 2 sets - 10 reps - 5 seconds hold - Shoulder External Rotation Walkouts with Anchored Resistance  - 1 x daily - 4-5 x weekly - 2 sets - 10 reps - 5 seconds hold - Shoulder Internal Rotation Walkouts with Anchored Resistance  - 1 x daily - 4-5 x weekly - 2 sets - 10 reps - 5 seconds hold - Tandem Stance  - 1 x daily - 4-5 x weekly - 3 sets - 2 reps - 30 seconds hold  ASSESSMENT:  CLINICAL IMPRESSION: Pt tolerating exercises well today. Pt with good  response to percussion/manual therapy to his Rt shoulder with decreased pain. Treatment focusing on LE strength and balance. Recommending continued skilled PT per treatment plan.    OBJECTIVE IMPAIRMENTS: difficulty walking, decreased ROM, decreased strength, increased edema, impaired flexibility, impaired UE functional use, postural dysfunction, and pain.   ACTIVITY LIMITATIONS: lifting, bending, standing, squatting, sleeping, stairs, transfers, reach over head, and hygiene/grooming  PARTICIPATION LIMITATIONS: cleaning, community activity, and yard work  PERSONAL FACTORS: 3+ comorbidities: see pertinent history are also affecting patient's functional outcome.   REHAB POTENTIAL: Good  CLINICAL DECISION MAKING: Evolving/moderate complexity  EVALUATION COMPLEXITY: High   GOALS: Goals reviewed with patient? Yes  SHORT TERM GOALS: (target date for Short term goals are 3 weeks 12/25/2023)   1.  Patient will demonstrate independent use of home exercise program to maintain progress from in clinic treatments.  Goal status:MET 02/18/24  2. Pt to improve his 5 time sit to stand to </=  13  seconds with UE support.   Goal Status: MET 02/18/24   LONG TERM GOALS: (target dates for all long term goals are 8 weeks 05/21/24)   1. Patient will demonstrate/report pain at worst less than or equal to 2/10 to facilitate minimal limitation in daily activity secondary to pain symptoms.  Goal status: On Going 05/06/2024 (can reach 5-6/10 at times)   2. Patient will demonstrate independent use of home exercise program to facilitate ability to maintain/progress functional gains from skilled physical therapy services.  Goal status: On Going 04/29/2024   3. Patient will demonstrate FOTO outcome >  or = 51 % to indicate reduced disability due to condition.  49.5% FOTO 02/18/24 Goal status: On Going 04/29/2024   4.  Patient will be able to perform up and down 1 flight of stairs with reciprocal gait pattern with  left knee pain and low back pain </= 2/10.   Goal status: On Going 04/29/2024   5.  Patient will demonstrate improved Rt UE flexion to >/= 150 degrees in order to improve functional mobility.   Goal status: Met 02/29/2024   6.  Pt will improve Rt shoulder ER to >/= 60 deg in order to improve functional mobility.  Goal status: On Going   04/29/2024      PLAN:  PT FREQUENCY: 1-2x/week  PT DURATION: 6 weeks  PLANNED INTERVENTIONS: Can include 60454- PT Re-evaluation, 97110-Therapeutic exercises, 97530- Therapeutic activity, 97112- Neuromuscular re-education, 97535- Self Care, 97140- Manual therapy, [phone]- Gait training, [phone]- Orthotic Fit/training, [phone]- Canalith repositioning, [id number]- Aquatic Therapy, 97014- Electrical stimulation (unattended), [id number]- Electrical stimulation (manual), [id number]- Vasopneumatic device, [id number]- Ultrasound, [id number]- Traction (mechanical), [id number]- Ionotophoresis 4mg /ml Dexamethasone , Patient/Family education, Balance training, Stair training, Taping, Dry Needling, Joint mobilization, Joint manipulation, Spinal manipulation, Spinal mobilization, Scar mobilization, Vestibular training, Visual/preceptual remediation/compensation, DME instructions, Cryotherapy, and Moist heat.  All performed as medically necessary.  All included unless contraindicated  PLAN FOR NEXT SESSION:   Measure active dorsiflexion BLEs.  continue with shoulder and back exercises, capsular stretching, body mechanics, low back and postural strengthening,  Repeat percussion to pt's Rt shoulder as needed.   Re-certification: 03/26/24 for 8 weeks 1-2x/ week   Marysue Sola, PT, MPT 05/06/2024, 11:46 AM

## 2024-05-07 ENCOUNTER — Ambulatory Visit (INDEPENDENT_AMBULATORY_CARE_PROVIDER_SITE_OTHER): Admitting: Rehabilitative and Restorative Service Providers"

## 2024-05-07 ENCOUNTER — Encounter: Payer: Self-pay | Admitting: Rehabilitative and Restorative Service Providers"

## 2024-05-07 DIAGNOSIS — M6281 Muscle weakness (generalized): Secondary | ICD-10-CM

## 2024-05-07 DIAGNOSIS — M79641 Pain in right hand: Secondary | ICD-10-CM

## 2024-05-07 DIAGNOSIS — R278 Other lack of coordination: Secondary | ICD-10-CM

## 2024-05-07 DIAGNOSIS — M25531 Pain in right wrist: Secondary | ICD-10-CM

## 2024-05-07 DIAGNOSIS — M25631 Stiffness of right wrist, not elsewhere classified: Secondary | ICD-10-CM | POA: Diagnosis not present

## 2024-05-08 ENCOUNTER — Ambulatory Visit (INDEPENDENT_AMBULATORY_CARE_PROVIDER_SITE_OTHER): Admitting: Physical Therapy

## 2024-05-08 ENCOUNTER — Encounter: Payer: Self-pay | Admitting: Physical Therapy

## 2024-05-08 DIAGNOSIS — M25531 Pain in right wrist: Secondary | ICD-10-CM

## 2024-05-08 DIAGNOSIS — M5459 Other low back pain: Secondary | ICD-10-CM | POA: Diagnosis not present

## 2024-05-08 DIAGNOSIS — G8929 Other chronic pain: Secondary | ICD-10-CM

## 2024-05-08 DIAGNOSIS — M25511 Pain in right shoulder: Secondary | ICD-10-CM | POA: Diagnosis not present

## 2024-05-08 DIAGNOSIS — M6281 Muscle weakness (generalized): Secondary | ICD-10-CM | POA: Diagnosis not present

## 2024-05-08 DIAGNOSIS — M25562 Pain in left knee: Secondary | ICD-10-CM

## 2024-05-08 NOTE — Therapy (Signed)
 OUTPATIENT PHYSICAL THERAPY TREATMENT    Patient Name: Jose [name] MRN: [id number] DOB:July 05, 1951, 73 y.o., male Today's Date: 05/08/2024  END OF SESSION:  PT End of Session - 05/08/24 0940     Visit Number 17    Number of Visits 22    Date for PT Re-Evaluation 05/21/24    Authorization Type Re-certification for 1-2x/ week for up to 8 weeks    PT Start Time 0935    PT Stop Time 1015    PT Time Calculation (min) 40 min    Activity Tolerance Patient tolerated treatment well;No increased pain    Behavior During Therapy WFL for tasks assessed/performed                         Past Medical History:  Diagnosis Date   Arthritis    R shoulder, great toes, hands- has gout & has injections in knees with Dr. Alvira Josephs    Bicuspid aortic valve with ascending aorta 4.0 to 4.5 cm in diameter    CAD (coronary artery disease)    Cancer (HCC)    skin ca-    GERD (gastroesophageal reflux disease)    Headache    History of hiatal hernia    Hypertension    Prostate cancer (HCC)    S/P TAVR (transcatheter aortic valve replacement)    SDH (subdural hematoma) (HCC)    Severe aortic stenosis    Thoracic ascending aortic aneurysm (HCC)    Vertebral artery aneurysm [facility])    Past Surgical History:  Procedure Laterality Date   BRAIN SURGERY  2016   evacuation of SDH   CARDIAC CATHETERIZATION     CARPAL TUNNEL RELEASE Bilateral    embolization of left vertebral artery aneurysm with pipeline/coil  10/31/2022   GREEN LIGHT LASER TURP (TRANSURETHRAL RESECTION OF PROSTATE  04/27/2017   KNEE ARTHROPLASTY     LEFT HEART CATH AND CORONARY ANGIOGRAPHY N/A 10/08/2019   Procedure: LEFT HEART CATH AND CORONARY ANGIOGRAPHY;  Surgeon: Arnoldo Lapping, MD;  Location: [facility] INVASIVE CV LAB;  Service: Cardiovascular;  Laterality: N/A;   QUADRICEPS TENDON REPAIR Bilateral 06/15/2017   Procedure: REPAIR QUADRICEP TENDON;  Surgeon: Jasmine Mesi, MD;  Location: [facility] OR;  Service:  Orthopedics;  Laterality: Bilateral;   SHOULDER SURGERY Right    TEE WITHOUT CARDIOVERSION N/A 07/03/2022   Procedure: TRANSESOPHAGEAL ECHOCARDIOGRAM (TEE);  Surgeon: Loyde Rule, MD;  Location: [facility] ENDOSCOPY;  Service: Cardiovascular;  Laterality: N/A;   TRANSCATHETER AORTIC VALVE REPLACEMENT, TRANSFEMORAL  12/23/2019   TRANSCATHETER AORTIC VALVE REPLACEMENT, TRANSFEMORAL N/A 12/23/2019   Procedure: TRANSCATHETER AORTIC VALVE REPLACEMENT, TRANSFEMORAL;  Surgeon: Arnoldo Lapping, MD;  Location: [facility] OR;  Service: Open Heart Surgery;  Laterality: N/A;   VASCULAR SURGERY     Patient Active Problem List   Diagnosis Date Noted   Hypertension    Severe aortic stenosis 10/08/2019   Candidiasis    Abnormality of gait    Hypoalbuminemia due to protein-calorie malnutrition [facility])    History of gout    Sleep disturbance    Constipation    Rupture of quadriceps tendon, right, sequela 06/19/2017   Quadriceps tendon rupture, left, sequela 06/19/2017   Acute blood loss anemia 06/19/2017   Fall    History of subdural hematoma    Post-operative pain    Recurrent falls    Quadriceps tendon rupture 06/15/2017   Primary osteoarthritis of both knees 02/28/2017   Primary osteoarthritis of both hands 02/28/2017   Uricacidemia  02/28/2017   Pain in joint of left knee 02/07/2017   Idiopathic chronic gout, unspecified site, without tophus (tophi) 11/29/2016   HEMATURIA UNSPECIFIED 01/25/2009   Abdominal pain 01/25/2009   XERODERMA 03/10/2008   UNS ADVRS EFF UNS RX MEDICINAL&BIOLOGICAL SBSTNC 03/10/2008   ADJUSTMENT REACTION WITH PHYSICAL SYMPTOMS 02/14/2008   MUSCLE SPASM 02/14/2008   ACUTE PROSTATITIS 12/16/2007   BREAST LUMP OR MASS, RIGHT 07/12/2007   H/O: RCT (rotator cuff tear) 01/13/2007   GOUT 01/08/2007    PCP: Elisabeth Guild, MD   REFERRING PROVIDER: Nicholas Bari, MD   REFERRING DIAG:  [phone] (ICD-10-CM) - Synovitis of left knee  M19.011 (ICD-10-CM) - Primary osteoarthritis,  right shoulder  M54.50 (ICD-10-CM) - Low back pain, unspecified back pain laterality, unspecified chronicity, unspecified whether sciatica present    THERAPY DIAG:  Pain in right wrist  Muscle weakness (generalized)  Other low back pain  Chronic right shoulder pain  Chronic pain of left knee  Rationale for Evaluation and Treatment: Rehabilitation  ONSET DATE: see subjective, on-going for years  SUBJECTIVE:  SUBJECTIVE STATEMENT: The thumb is better with brace. The back is stiff in morning then okay (~3/10).  The back of capsule shoulder that can be triggered by lifting or moving something (does not have to be over his head).   EVAL:  Pt stating he has been painting a lot at home and feels it may have exacerbated his Rt shoulder. Pt reporting radiation pain down his arm but no numbness/tingling. Pt stating Rt shoulder is giving him the most problem. Pt stating after getting the second shingles shot his shoulder pain increased into tricep and shoulder. Pt stating pain has been ongoing for about a year. Pt stating pain is worse at night.  Pt stating left knee pain has been ongoing since January 2018 when he was skiing and twisted his knee and he tore his quadriceps tendon. Pt stating he fell and when he fell he tore both tendons in bil LE. Pt then stating the repair didn't take on his left LE and it was repaired again. Pt stating he had PT and he gained ROM back in bil LE.  Pt reporting falling while in hospital during a bed transfer where he landed on his floor on his back in 2018. Pt stating his back has bothered him on/off ever since.  Pt stating he hasn't been able to work out over the past few months due to recent move into [place].  PERTINENT HISTORY: HTN, severe aortic stenosis, abnormality of gait, h/o gout, rupture of quadriceps tendon on Rt 2018 on left 2018, subdural hematoma 2016, h/o falls, primary OA in bil hands Polio on Left side affecting Left UE strength  PAIN:   NPRS scale:  at rest 0/10 and up to 7-8/10 in his Rt shoulder Pain description: achy, throbbing Aggravating factors: resting, tylenol , voltaren  Relieving factors: Reaching, overhead function, spine flexion and slouched postures  Low Back pain:  at rest 0/10 and up to 5-6/10 more to left side of lumbar.   PRECAUTIONS: None  WEIGHT BEARING RESTRICTIONS: No  FALLS:  Has patient fallen in last 6 months? No falls in last few years. Haven't fell since he tore his quad tendons and during a hospital transfer several years ago  LIVING ENVIRONMENT: Lives with: lives with their family and lives with an adult companion Lives in: House/apartment Stairs: Yes: Internal: 17 steps; on left going up and External: 2 steps; on left going up Has following equipment at home: None  OCCUPATION: retired  PLOF: Independent  PATIENT GOALS: Be able to work out at gym with less pain  Next MD visit:   OBJECTIVE:   DIAGNOSTIC FINDINGS: Shoulder: 08/06/23  3 views of the right shoulder including Grashey, scapular Y and axial view  were ordered and reviewed by myself.  X-rays show severe osteoarthritis of  the right glenohumeral joint.  There is a large inferior spur off the  humeral head.  There is some chondrocalcinosis superior to the humeral  head, likely indicative of pseudogout or prior gout.  No acute bony  fracture noted.  There is bony sclerosis noted of the humeral head and  inferior acetabulum from likely arthritic change.   Knee 05/22/23:   1. Postsurgical changes of quadriceps tendon repair. The tendon appears intact. Resolution of the prior small fluid bright signal within the midsubstance of the quadriceps tendon insertion on prior 10/01/2018 MRI. 2. Stable chronic oblique tear of the anterior horn of the lateral meniscus. Mild degenerative undersurface fraying of the posterior horn of the lateral meniscus. 3. Interval worsening of moderate to severe patellofemoral  cartilage degenerative changes. 4. Mildly worsened cartilage loss within the anteromedial aspect of the weight-bearing lateral femoral condyle and adjacent lateral tibial plateau. 5. Unchanged high-grade partial to full-thickness cartilage loss within the weight-bearing medial femoral condyle.  Lumbar: 05/22/23:  AP lateral radiographs lumbar spine reviewed.  Grade 1-2 spondylolisthesis present at L5-S1 with pars fracture noted at the corresponding level.   Mild to moderate degenerative changes present in the upper and and middle lumbar spine region.  Visualized hips without arthritis   PATIENT SURVEYS:  12/05/23: FOTO intake:  49%  predicted:  51% 12/31/23: FOTO update 48%  02/27/24: FOTO update 57% 04/22/24: FOTO 59.5%  COGNITION: Overall cognitive status: WFL    SENSATION: WFL  POSTURE:  rounded shoulders, forward head, and decreased lumbar lordosis  PALPATION: TTP: distal left quad tendon, Rt upper trap and levator and anterior shoulder  MMT   Right 12/04/23 Left 12/04/23 Rt  / Left 12/18/23 HHD ppsi Right 02/29/24 Rt 03/17/24 Rt 03/26/24 Lt 03/26/24 Rt  04/22/24 Left 04/22/24  Hip flexion 5 5         Hip extension 5 5         Hip abduction 5 5         Hip adduction 5 5         Hip internal rotation           Hip external rotation           Knee flexion 5 5 52.5 / 40.0   64.1 51.0 60.0, 59.9 58.6, 57.6  Knee extension 5 5 54.4 / 65.5    52.1 64.3 74.2, 75.0  66.2, 61.8                  SHOULDER            Shoulder flexion 4 3         Shoulder abduction 4 3         Shoulder ER 4- 3  23.0 pounds 27.4 pounds 28.9 pounds  17.8, 18.5   Shoulder IR 4- 3  33.4 pounds 39.4 pounds 39.6 pounds  38.0, 37.6    (Blank rows = not tested)    ROM  LE Right 12/04/23 Left 12/04/23 Rt  01/22/24 Left 01/22/24 Active supine 02/18/24 Rt  Active standing 02/29/24 Right 05/08/24 Right/left  Hip flexion 108 110 112 112 140  Hip extension         Hip abduction         Hip  adduction         Hip internal rotation         Hip external rotation         Knee flexion 134 126       Knee extension 0 0       Ankle Dorsiflexion       Seated knee flexed  active Right: -7* Left: 0* Standing knee ext Right: -11* Left:  -5*   SHOULDER:           Shoulder flexion 138 158 145 155 155 150   Shoulder abduction 125 140   130    Shoulder ER 30 60   52 50   Shoulder IR      20   Shoulder horizontal adduction      35    (Blank rows = not tested)    FUNCTIONAL TESTS:   12/04/23 : 5 time sit to stand: 17.2 seconds no UE support 01/22/24: 5 time sit to stand: 9.2 seconds no UE support 02/18/24:  10.1 seconds no UE support  GAIT: Distance walked: clinic distances, level surface Assistive device utilized: None Level of assistance: Complete Independence Comments: mild forward flexed trunk posture                                                                                                                                                                       TODAY'S TREATMENT                                                                           DATE:   05/06/2024 Therapeutic Exercise: Nustep; x 10 minutes level  seat 10 UE/LE  Therapeutic Activities: Sitting on green 65cm ball warm up circles;  then overdoor pulleys 90 sec flexion & 90 sec abduction Tandem stance with intermittent UE touch on sink 30 sec RLE in front & LLE in back; 1st set on floor eyes open 1-2 touches; 2nd set on floor eyes closed with frequent touches; 3rd set on foam eyes open with frequent touches.  Standing on foam feet ~2" apart eyes open head motions right/left, up/down & 2 diagonals  Standing on foam heel raises & toe raise 10 reps ea.  Light BUE support.   Self-Care: PT demo & verbal cues on use TheraCane for shoulder trigger points.  Pt verbalized &  return demo understanding.     TREATMENT                                                                           DATE:    05/06/2024 Therapeutic Exercise: Nustep; x 10 minutes level 6 UE/LE Hip hiking at door way x 10 bil LE Standing wall extension elbow on the wall x 10 holding 5-10 sec each TherActivites:  Leg Press: double: 106# 2 x 15, single 56# 2 x 10  Standing on Ariex mat: staggered stance bil x 60 sec Side stepping green TB around knees 20 feet x 4  Manual Percussion device to pt's infraspinatus, sub scapulae and tricep on Rt UE x 8 minutes    TREATMENT                                                                           DATE:   04/30/2024 Therapeutic Exercise: Reactive / isometric shoulder internal & external rotation green theraband (looped over wrist to protect hand)  10 reps BUEs   gastroc stretch heel depression on step & pt performed 30 sec hold 2 reps BLEs.   alternating BLE heel raises & toe raises with pelvic weight shifts 10 reps 2 sets Seated ankle DF with heel on towel roll for increased range 10 rep 2 sets.  PT added to HEP with HO, demo and verbal cues.  Pt verbalized understanding after performing in clinic.   Neuromuscular Re-education: Tandem stance 30 sec ea 1st rep RLE in front & 2nd rep LLE in front. 1st set on floor eyes open, 2nd set on floor with eyes closed, 3rd set on foam eyes open.  PT educated on HEP with HO & verbal cues. Pt verbalized understanding after completing in clinic.    TREATMENT                                                                           DATE:   04/29/2024 Therapeutic Exercise: Reactive / isometric shoulder internal & external rotation green theraband (looped over wrist to protect hand)  10 reps BUEs Pt has RLE foot drop with gait activities.  PT educated in gastroc stretch heel depression on step & pt performed 30 sec hold 2 reps BLEs.   Then alternating BLE heel raises & toe raises with pelvic weight shifts 10 reps PT added above 2 exercises to HEP with HO.  Pt verbalized understanding after performing in clinic.   NMR:  Sitting  on green Swiss ball: (core activation and dynamic stability) 10 reps lateral pelvic shifting bil  Pelvic circles: x 10 bil directions Rows: alternating UEs 5 reps then Bil UE 15 reps  blue theraband  Shoulder extension blue theraband - alternating UEs 10 reps & BUEs 10 reps Alternating stepping LE out / back knee ext / flexion 10 reps  Alternating stepping out to side and back in - abd / add 10 reps Marching 10 reps Marching with contralateral UE touching ant knee for counter trunk rotation 10 reps LAQ: x 10 bil LE     PATIENT EDUCATION:  Education details: HEP, POC Person educated: Patient Education method: Programmer, multimedia, Demonstration, Verbal cues, and Handouts Education comprehension: verbalized understanding, returned demonstration, and verbal cues required  HOME EXERCISE PROGRAM: AAccess Code: [email or web address] URL: [email or web address] Date: 04/30/2024 Prepared by: Lorie Rook  Exercises - Standing Shoulder Row with Anchored Resistance  - 1 x daily - 7 x weekly - 3 sets - 10 reps - Supine Bridge  - 2 x daily - 7 x weekly - 2 sets - 10 reps - 5 seconds hold - Supine Shoulder External Rotation in 45 Degrees Abduction AAROM with Dowel  - 1 x daily - 7 x weekly - 3 sets - 10 reps - Supine Shoulder Flexion Extension AAROM with Dowel  - 2-3 x daily - 7 x weekly - 2 sets - 10 reps - Seated Small Alternating Straight Leg Lifts with Heel Touch  - 2 x daily - 7 x weekly - 15-20 reps - Recumbent Bike  - 2-3 x daily - 7 x weekly - Bridge with Hip Abduction and Resistance  - 1-2 x daily - 7 x weekly - 2 sets - 10 reps - Supine Shoulder External Rotation Stretch  - 2 x daily - 7 x weekly - 1 sets - 10-20 reps - 10 seconds hold - Supine Shoulder Internal Rotation  - 2 x daily - 7 x weekly - 1 sets - 10-20 reps - 10 seconds hold - Standing Scapular Retraction  - 5 x daily - 7 x weekly - 1 sets - 5 reps - 5 second hold - Swiss Ball March  - 1 x daily - 7 x weekly - 2 sets - 10 reps -  Seated Lateral Pelvic Tilt on Swiss Ball  - 1 x daily - 7 x weekly - 10 reps - Pelvic Circles on [employer]  - 1 x daily - 7 x weekly - 3 sets - 10 reps - Swiss Ball Knee Extension  - 1 x daily - 7 x weekly - 2 sets - 10 reps - Seated Shoulder Extension and Scapular Retraction with Resistance on Swiss Ball  - 1 x daily - 7 x weekly - 2 sets - 10 reps - Seated Shoulder W External Rotation on [employer]  - 1 x daily - 7 x weekly - 10 reps - Row with Anchored Resistance on [employer]  - 1 x daily - 7 x weekly - 2 sets - 10 reps - Gastroc Stretch on Step  - 1 x daily - 7 x weekly - 1 sets - 3 reps - 30 seconds hold - Heel Toe Raises at [employer]  - 1 x daily - 3-4 x weekly - 1-2 sets - 10 reps - 5 seconds hold - Seated Toe Raise  - 1 x daily - 7 x weekly - 2 sets - 10 reps - 5 seconds hold - Shoulder External Rotation Walkouts with Anchored Resistance  - 1 x daily - 4-5 x weekly - 2 sets - 10 reps - 5 seconds hold - Shoulder Internal Rotation Walkouts with Anchored Resistance  - 1 x daily -  4-5 x weekly - 2 sets - 10 reps - 5 seconds hold - Tandem Stance  - 1 x daily - 4-5 x weekly - 3 sets - 2 reps - 30 seconds hold  ASSESSMENT:  CLINICAL IMPRESSION: Patient appears to be moving with low back issues and shoulder issues with less pain.  He appears to understand updated HEP.  Recommending continued skilled PT per treatment plan.   OBJECTIVE IMPAIRMENTS: difficulty walking, decreased ROM, decreased strength, increased edema, impaired flexibility, impaired UE functional use, postural dysfunction, and pain.   ACTIVITY LIMITATIONS: lifting, bending, standing, squatting, sleeping, stairs, transfers, reach over head, and hygiene/grooming  PARTICIPATION LIMITATIONS: cleaning, community activity, and yard work  PERSONAL FACTORS: 3+ comorbidities: see pertinent history are also affecting patient's functional outcome.   REHAB POTENTIAL: Good  CLINICAL DECISION MAKING: Evolving/moderate  complexity  EVALUATION COMPLEXITY: High   GOALS: Goals reviewed with patient? Yes  SHORT TERM GOALS: (target date for Short term goals are 3 weeks 12/25/2023)   1.  Patient will demonstrate independent use of home exercise program to maintain progress from in clinic treatments.  Goal status:MET 02/18/24  2. Pt to improve his 5 time sit to stand to </=  13  seconds with UE support.   Goal Status: MET 02/18/24   LONG TERM GOALS: (target dates for all long term goals are 8 weeks 05/21/24)   1. Patient will demonstrate/report pain at worst less than or equal to 2/10 to facilitate minimal limitation in daily activity secondary to pain symptoms.  Goal status: On Going  05/08/2024 (can reach 5-6/10 at times)   2. Patient will demonstrate independent use of home exercise program to facilitate ability to maintain/progress functional gains from skilled physical therapy services.  Goal status: On Going  05/08/2024   3. Patient will demonstrate FOTO outcome > or = 51 % to indicate reduced disability due to condition.  49.5% FOTO 02/18/24 Goal status: On Going  05/08/2024   4.  Patient will be able to perform up and down 1 flight of stairs with reciprocal gait pattern with left knee pain and low back pain </= 2/10.   Goal status: On Going  05/08/2024   5.  Patient will demonstrate improved Rt UE flexion to >/= 150 degrees in order to improve functional mobility.   Goal status: Met 02/29/2024   6.  Pt will improve Rt shoulder ER to >/= 60 deg in order to improve functional mobility.  Goal status: On Going   05/08/2024      PLAN:  PT FREQUENCY: 1-2x/week  PT DURATION: 6 weeks  PLANNED INTERVENTIONS: Can include 40981- PT Re-evaluation, 97110-Therapeutic exercises, 97530- Therapeutic activity, 97112- Neuromuscular re-education, 97535- Self Care, 97140- Manual therapy, [phone]- Gait training, [phone]- Orthotic Fit/training, [phone]- Canalith repositioning, [id number]- Aquatic Therapy, 97014- Electrical  stimulation (unattended), [id number]- Electrical stimulation (manual), [id number]- Vasopneumatic device, [id number]- Ultrasound, [id number]- Traction (mechanical), [id number]- Ionotophoresis 4mg /ml Dexamethasone , Patient/Family education, Balance training, Stair training, Taping, Dry Needling, Joint mobilization, Joint manipulation, Spinal manipulation, Spinal mobilization, Scar mobilization, Vestibular training, Visual/preceptual remediation/compensation, DME instructions, Cryotherapy, and Moist heat.  All performed as medically necessary.  All included unless contraindicated  PLAN FOR NEXT SESSION: Patient is going on vacation next week.  Plan of care ends 6/4 so we will need to recheck LTG's the week he returns to therapy.  Re-certification: 03/26/24 for 8 weeks 1-2x/ week   Lorie Rook, PT, DPT 05/08/2024, 3:03 PM

## 2024-05-23 NOTE — Therapy (Signed)
 OUTPATIENT OCCUPATIONAL THERAPY TREATMENT NOTE  Patient Name: Jose [name] MRN: [id number] DOB:10-10-1951, 73 y.o., male Today's Date: 05/27/2024  PCP: Elisabeth Guild, MD REFERRING PROVIDER: Nicholas Bari, MD   END OF SESSION:  OT End of Session - 05/27/24 1148     Visit Number 4    Number of Visits 10    Date for OT Re-Evaluation 06/13/24    Authorization Type Medicare    OT Start Time 1148    OT Stop Time 1230    OT Time Calculation (min) 42 min    Activity Tolerance Patient tolerated treatment well;No increased pain;Patient limited by pain;Patient limited by fatigue    Behavior During Therapy [facility] for tasks assessed/performed                Past Medical History:  Diagnosis Date   Arthritis    R shoulder, great toes, hands- has gout & has injections in knees with Dr. Alvira Josephs    Bicuspid aortic valve with ascending aorta 4.0 to 4.5 cm in diameter    CAD (coronary artery disease)    Cancer (HCC)    skin ca-    GERD (gastroesophageal reflux disease)    Headache    History of hiatal hernia    Hypertension    Prostate cancer (HCC)    S/P TAVR (transcatheter aortic valve replacement)    SDH (subdural hematoma) (HCC)    Severe aortic stenosis    Thoracic ascending aortic aneurysm (HCC)    Vertebral artery aneurysm [facility])    Past Surgical History:  Procedure Laterality Date   BRAIN SURGERY  2016   evacuation of SDH   CARDIAC CATHETERIZATION     CARPAL TUNNEL RELEASE Bilateral    embolization of left vertebral artery aneurysm with pipeline/coil  10/31/2022   GREEN LIGHT LASER TURP (TRANSURETHRAL RESECTION OF PROSTATE  04/27/2017   KNEE ARTHROPLASTY     LEFT HEART CATH AND CORONARY ANGIOGRAPHY N/A 10/08/2019   Procedure: LEFT HEART CATH AND CORONARY ANGIOGRAPHY;  Surgeon: Arnoldo Lapping, MD;  Location: [facility] INVASIVE CV LAB;  Service: Cardiovascular;  Laterality: N/A;   QUADRICEPS TENDON REPAIR Bilateral 06/15/2017   Procedure: REPAIR QUADRICEP TENDON;   Surgeon: Jasmine Mesi, MD;  Location: [facility] OR;  Service: Orthopedics;  Laterality: Bilateral;   SHOULDER SURGERY Right    TEE WITHOUT CARDIOVERSION N/A 07/03/2022   Procedure: TRANSESOPHAGEAL ECHOCARDIOGRAM (TEE);  Surgeon: Loyde Rule, MD;  Location: [facility] ENDOSCOPY;  Service: Cardiovascular;  Laterality: N/A;   TRANSCATHETER AORTIC VALVE REPLACEMENT, TRANSFEMORAL  12/23/2019   TRANSCATHETER AORTIC VALVE REPLACEMENT, TRANSFEMORAL N/A 12/23/2019   Procedure: TRANSCATHETER AORTIC VALVE REPLACEMENT, TRANSFEMORAL;  Surgeon: Arnoldo Lapping, MD;  Location: [facility] OR;  Service: Open Heart Surgery;  Laterality: N/A;   VASCULAR SURGERY     Patient Active Problem List   Diagnosis Date Noted   Hypertension    Severe aortic stenosis 10/08/2019   Candidiasis    Abnormality of gait    Hypoalbuminemia due to protein-calorie malnutrition [facility])    History of gout    Sleep disturbance    Constipation    Rupture of quadriceps tendon, right, sequela 06/19/2017   Quadriceps tendon rupture, left, sequela 06/19/2017   Acute blood loss anemia 06/19/2017   Fall    History of subdural hematoma    Post-operative pain    Recurrent falls    Quadriceps tendon rupture 06/15/2017   Primary osteoarthritis of both knees 02/28/2017   Primary osteoarthritis of both hands 02/28/2017   Uricacidemia 02/28/2017  Pain in joint of left knee 02/07/2017   Idiopathic chronic gout, unspecified site, without tophus (tophi) 11/29/2016   HEMATURIA UNSPECIFIED 01/25/2009   Abdominal pain 01/25/2009   XERODERMA 03/10/2008   UNS ADVRS EFF UNS RX MEDICINAL&BIOLOGICAL SBSTNC 03/10/2008   ADJUSTMENT REACTION WITH PHYSICAL SYMPTOMS 02/14/2008   MUSCLE SPASM 02/14/2008   ACUTE PROSTATITIS 12/16/2007   BREAST LUMP OR MASS, RIGHT 07/12/2007   H/O: RCT (rotator cuff tear) 01/13/2007   GOUT 01/08/2007    ONSET DATE: ~6 weeks onset pain  REFERRING DIAG:  M25.531 (ICD-10-CM) - Pain in right wrist  M25.521 (ICD-10-CM) - Pain  in right elbow  M65.30 (ICD-10-CM) - Trigger finger, unspecified finger, unspecified laterality    THERAPY DIAG:  Pain in right wrist  Muscle weakness (generalized)  Stiffness of right wrist, not elsewhere classified  Other lack of coordination  Rationale for Evaluation and Treatment: Rehabilitation  PERTINENT HISTORY: having problems with: Rt wrist, Rt elbow and trigger finger  currently seeing PT for shoulder pain and problems on the right side he has pain near the base of the right thumb for about 6 weeks now.  He also has triggering in his fingers 3 and 4 of bilateral hands chronically.  He has received injections to the base of the thumb hand and the wrist to help with his pain.  He is wearing a prefabricated wrist and thumb spica brace today.  He does workout frequently, trying to stay active and he is going to the gym after this, he states   PRECAUTIONS: None  RED FLAGS: None   WEIGHT BEARING RESTRICTIONS: Yes recommended to limit weightbearing through the right wrist and hand to 5 pounds or less for the next few weeks if possible.    SUBJECTIVE:   SUBJECTIVE STATEMENT: He states  having a great vacation to [place] , wearing the orthosis most of the time for protection, doing HEP.     PAIN:  Are you having pain? Yes: NPRS scale:  1/10 at rest now Pain location: Rt thumb CMC J and MCPJs in  Pain description: Sharp and shooting Aggravating factors: Wrist and thumb motion Relieving factors: Unsure  FALLS: Has patient fallen in last 6 months? No  PLOF: Independent  PATIENT GOALS: To improve pain in the right wrist as well as limit triggering and pain through the fingers and increase functional ability  NEXT MD VISIT: As needed   OBJECTIVE: (All objective assessments below are from initial evaluation on: 05/01/24 unless otherwise specified.)    HAND DOMINANCE: Right   ADLs: Overall ADLs: States decreased ability to grab, hold household objects, pain and  difficulty to open containers, perform FMS tasks (manipulate fasteners on clothing)   FUNCTIONAL OUTCOME MEASURES: Eval: Quick DASH 34% impairment today  (Higher % Score  =  More Impairment)     UPPER EXTREMITY ROM     Shoulder to Wrist AROM Right eval Left eval Rt 05/07/24 Rt 05/27/24  Forearm supination 67 pain   77  Forearm pronation  85   86  Wrist flexion 42 72 32 58  Wrist extension 60 69 50 56  Wrist ulnar deviation      Wrist radial deviation      Functional dart thrower's motion (F-DTM) in ulnar flexion      F-DTM in radial extension       (Blank rows = not tested)   Hand AROM Right eval Left eval  Full Fist Ability (or Gap to Distal Palmar Crease) Full fist with notable triggering  of digits 4 and 3 Full fist with notable triggering of digits 4 and 3  Thumb Opposition  (Kapandji Scale)  9/10 9/10  (Blank rows = not tested)   UPPER EXTREMITY MMT:      MMT Right 05/01/2024  Forearm supination 4 -/5 painful  Forearm pronation 4/5  Wrist flexion 4 -/5 painful  Wrist extension 5/5  Wrist ulnar deviation 5/5  Wrist radial deviation 4 -/5 painful  (Blank rows = not tested)  HAND FUNCTION: Eval: Observed weakness in affected bilateral hands due to triggering and pain.  Strong grip not advised, but this can be tested in the future if he feels better.   COORDINATION: Eval: Observed coordination impairments with affected right wrist and arm due to painful tenosynovitis of the thumb tendons.  Details will be tested as needed in upcoming sessions 9 Hole Peg Test Right: TBD sec, Left: TBD sec (TBD sec is [facility])    OBSERVATIONS:   Eval: He has a positive Finkelstein's test on the right wrist and negative on the left wrist.  He is not tender to the [facility] joint of the right thumb.  Supination is tight and painful near the base of the thumb as well.  Today he presents as deQuervain's Tenosynovitis of the right thumb extensor tendons and triggering (or stenosing tenosynovitis)  of the bilateral middle fingers and ring fingers.    TODAY'S TREATMENT:  05/27/24: He performs active range of motion for new measures showing excellent improvement now with the wrist and in the forearm.  This is mainly from rest and allowing the tendinitis or tenosynovitis to heal.  OT reviews his home exercises with him and he does seem mildly unfamiliar with some of them which prompts OT to review them with him and really asked him to be performing these things at least 4 times a day.  As he does have less pain now, OT does try to upgrade his HEP to light isometric training and wrist flexion and extension.  He does tolerate these, but they must be done very lightly.  He was asked to do these things just perhaps 2 or 3 times a day and skip a day if sore.  Every day he should be stretching and now starting to wean from his orthosis carefully and slowly.    Exercises - Forearm Supination Stretch  - 3-4 x daily - 3-5 reps - 15 sec hold - Forearm Pronation Stretch  - 3-4 x daily - 3-5 reps - 15 sec hold - Wrist Flexion Stretch  - 4 x daily - 3-5 reps - 15 sec hold - Seated Wrist Extension Stretch  - 3-6 x daily - 3-5 reps - 15 hold - Stretch Thumb DOWNWARD  - 2-3 x daily - 3 reps - 15 sec hold - HOOK Stretch  - 4 x daily - 3-5 reps - 15-20 sec hold - Isometric Wrist Extension Pronated  - 2-3 x daily - 4-5 x weekly - 5 reps - 5-10 seconds hold - Seated Isometric Wrist Flexion Supinated with Manual Resistance  - 4-6 x daily - 1 sets - 10-15 reps     PATIENT EDUCATION: Education details: See tx section above for details  Person educated: Patient Education method: Verbal Instruction, Teach back, Handouts  Education comprehension: States and demonstrates understanding, Additional Education required    HOME EXERCISE PROGRAM: Access Code: [email or web address] URL: [email or web address] Date: 05/01/2024 Prepared by: Leartis Proud   GOALS: Goals reviewed with patient? Yes   SHORT TERM  GOALS: (STG  required if POC>30 days) Target Date: 05/09/2024  Pt will obtain protective, custom orthotic. Goal status: 05/05/2024: Met  2.  Pt will demo/state understanding of initial HEP to improve pain levels and prerequisite motion. Goal status: INITIAL   LONG TERM GOALS: Target Date: 06/13/2024  Pt will improve functional ability by decreased impairment per Quick DASH assessment from 34% to 15% or better, for better quality of life. Goal status: INITIAL  2.  Pt will improve grip strength in right hand from weak and painful to at least nonpainful 30 lbs for functional use at home and in IADLs. Goal status: INITIAL  3.  Pt will improve A/ROM in right wrist flexion from painful 42 degrees to at least nonpainful 60 degrees, to have functional motion for tasks like reach and grasp.  Goal status: INITIAL  4.  Pt will improve strength in right wrist radial deviation from painful 4 -/5 MMT to at least 4+/5 MMT to have increased functional ability to carry out selfcare and higher-level homecare tasks with less difficulty. Goal status: INITIAL  5. Pt will decrease pain at worst from 10/10 to 4/10 or better to have better sleep and occupational participation in daily roles. Goal status: INITIAL   ASSESSMENT:  CLINICAL IMPRESSION: 05/27/24: Upgrading to light isometric strengthening now.  If tolerated in a week, add thumb light isometric training  05/07/24: He is doing better, having less pain with the new orthosis, but still needs to be cautious for several weeks most likely.  05/05/24: He was apparently doing his stretches too harshly and was causing himself pain, most of which he could do during the session today without pain after retraining.  A custom rigid orthosis was also necessary to help rest the APL and EPB tendons.  Hopefully intermittent stretching will prevent excess stiffness from occurring due to immobilization.    PLAN:  OT FREQUENCY: 1-2x/week  OT DURATION: 6 weeks  through 06/13/2024 and up to 10 total visits as needed  PLANNED INTERVENTIONS: 97535 self care/ADL training, 16109 therapeutic exercise, 97530 therapeutic activity, 97112 neuromuscular re-education, 97140 manual therapy, 97035 ultrasound, 97760 Orthotic Initial, 97763 Orthotic/Prosthetic subsequent, Dry needling, coping strategies training, patient/family education, and DME and/or AE instructions  RECOMMENDED OTHER SERVICES: In PT for shoulder pain and problems currently  CONSULTED AND AGREED WITH PLAN OF CARE: Patient  PLAN FOR NEXT SESSION:   Use heat, manual therapy, review stretches and do new isometric training.  When tolerated add light thumb isometric training.   Leartis Proud, OTR/L, CHT 05/27/2024, 12:59 PM

## 2024-05-26 ENCOUNTER — Encounter: Payer: Self-pay | Admitting: Rheumatology

## 2024-05-26 NOTE — Telephone Encounter (Signed)
 Contacted the patient to get some clarification. Patient states he was given a referral to [facility] for his hand. Patient states he finally got in contact with them after many attempts and was advised Dr. Aloha Arnold is not seeing new patients until September. Patient states he does not want to wait that long and will need a new referral. Please advise.   Patient states he is doing better with [facility], who he is going to see tomorrow. Patient states he is still having a lot of pain and swelling and would like to know next steps. Please advise.

## 2024-05-27 ENCOUNTER — Other Ambulatory Visit: Payer: Self-pay

## 2024-05-27 ENCOUNTER — Encounter: Payer: Self-pay | Admitting: Physical Therapy

## 2024-05-27 ENCOUNTER — Ambulatory Visit (INDEPENDENT_AMBULATORY_CARE_PROVIDER_SITE_OTHER): Admitting: Physical Therapy

## 2024-05-27 ENCOUNTER — Encounter: Payer: Self-pay | Admitting: Rehabilitative and Restorative Service Providers"

## 2024-05-27 ENCOUNTER — Ambulatory Visit (INDEPENDENT_AMBULATORY_CARE_PROVIDER_SITE_OTHER): Admitting: Rehabilitative and Restorative Service Providers"

## 2024-05-27 DIAGNOSIS — R278 Other lack of coordination: Secondary | ICD-10-CM | POA: Diagnosis not present

## 2024-05-27 DIAGNOSIS — M5459 Other low back pain: Secondary | ICD-10-CM | POA: Diagnosis not present

## 2024-05-27 DIAGNOSIS — M6281 Muscle weakness (generalized): Secondary | ICD-10-CM | POA: Diagnosis not present

## 2024-05-27 DIAGNOSIS — M25531 Pain in right wrist: Secondary | ICD-10-CM

## 2024-05-27 DIAGNOSIS — M25511 Pain in right shoulder: Secondary | ICD-10-CM | POA: Diagnosis not present

## 2024-05-27 DIAGNOSIS — M25562 Pain in left knee: Secondary | ICD-10-CM | POA: Diagnosis not present

## 2024-05-27 DIAGNOSIS — G8929 Other chronic pain: Secondary | ICD-10-CM

## 2024-05-27 DIAGNOSIS — M25631 Stiffness of right wrist, not elsewhere classified: Secondary | ICD-10-CM | POA: Diagnosis not present

## 2024-05-27 NOTE — Therapy (Addendum)
 OUTPATIENT PHYSICAL THERAPY TREATMENT    Patient Name: Jose [name] MRN: [id number] DOB:May 17, 1951, 73 y.o., male Today's Date: 05/27/2024  END OF SESSION:  PT End of Session - 05/27/24 1436     Visit Number 18    Number of Visits 22    Date for PT Re-Evaluation 05/21/24    Authorization Type Re-certification for 1x/ week for 6 more weeks from 05/27/24 to 07/11/24    PT Start Time 1430    PT Stop Time 1510    PT Time Calculation (min) 40 min    Activity Tolerance Patient tolerated treatment well;No increased pain    Behavior During Therapy WFL for tasks assessed/performed                         Past Medical History:  Diagnosis Date   Arthritis    R shoulder, great toes, hands- has gout & has injections in knees with Dr. [name]    Bicuspid aortic valve with ascending aorta 4.0 to 4.5 cm in diameter    CAD (coronary artery disease)    Cancer (HCC)    skin ca-    GERD (gastroesophageal reflux disease)    Headache    History of hiatal hernia    Hypertension    Prostate cancer (HCC)    S/P TAVR (transcatheter aortic valve replacement)    SDH (subdural hematoma) (HCC)    Severe aortic stenosis    Thoracic ascending aortic aneurysm (HCC)    Vertebral artery aneurysm [facility])    Past Surgical History:  Procedure Laterality Date   BRAIN SURGERY  2016   evacuation of SDH   CARDIAC CATHETERIZATION     CARPAL TUNNEL RELEASE Bilateral    embolization of left vertebral artery aneurysm with pipeline/coil  10/31/2022   GREEN LIGHT LASER TURP (TRANSURETHRAL RESECTION OF PROSTATE  04/27/2017   KNEE ARTHROPLASTY     LEFT HEART CATH AND CORONARY ANGIOGRAPHY N/A 10/08/2019   Procedure: LEFT HEART CATH AND CORONARY ANGIOGRAPHY;  Surgeon: Wonda Sharper, MD;  Location: [facility] INVASIVE CV LAB;  Service: Cardiovascular;  Laterality: N/A;   QUADRICEPS TENDON REPAIR Bilateral 06/15/2017   Procedure: REPAIR QUADRICEP TENDON;  Surgeon: Addie Cordella Glendia, MD;  Location: [facility]  OR;  Service: Orthopedics;  Laterality: Bilateral;   SHOULDER SURGERY Right    TEE WITHOUT CARDIOVERSION N/A 07/03/2022   Procedure: TRANSESOPHAGEAL ECHOCARDIOGRAM (TEE);  Surgeon: Delford Maude BROCKS, MD;  Location: [facility] ENDOSCOPY;  Service: Cardiovascular;  Laterality: N/A;   TRANSCATHETER AORTIC VALVE REPLACEMENT, TRANSFEMORAL  12/23/2019   TRANSCATHETER AORTIC VALVE REPLACEMENT, TRANSFEMORAL N/A 12/23/2019   Procedure: TRANSCATHETER AORTIC VALVE REPLACEMENT, TRANSFEMORAL;  Surgeon: Wonda Sharper, MD;  Location: [facility] OR;  Service: Open Heart Surgery;  Laterality: N/A;   VASCULAR SURGERY     Patient Active Problem List   Diagnosis Date Noted   Hypertension    Severe aortic stenosis 10/08/2019   Candidiasis    Abnormality of gait    Hypoalbuminemia due to protein-calorie malnutrition [facility])    History of gout    Sleep disturbance    Constipation    Rupture of quadriceps tendon, right, sequela 06/19/2017   Quadriceps tendon rupture, left, sequela 06/19/2017   Acute blood loss anemia 06/19/2017   Fall    History of subdural hematoma    Post-operative pain    Recurrent falls    Quadriceps tendon rupture 06/15/2017   Primary osteoarthritis of both knees 02/28/2017   Primary osteoarthritis of both hands 02/28/2017  Uricacidemia 02/28/2017   Pain in joint of left knee 02/07/2017   Idiopathic chronic gout, unspecified site, without tophus (tophi) 11/29/2016   HEMATURIA UNSPECIFIED 01/25/2009   Abdominal pain 01/25/2009   XERODERMA 03/10/2008   UNS ADVRS EFF UNS RX MEDICINAL&BIOLOGICAL SBSTNC 03/10/2008   ADJUSTMENT REACTION WITH PHYSICAL SYMPTOMS 02/14/2008   MUSCLE SPASM 02/14/2008   ACUTE PROSTATITIS 12/16/2007   BREAST LUMP OR MASS, RIGHT 07/12/2007   H/O: RCT (rotator cuff tear) 01/13/2007   GOUT 01/08/2007    PCP: Birda Ahumada, MD   REFERRING PROVIDER: Addie Cordella Hamilton, MD   REFERRING DIAG:  [phone] (ICD-10-CM) - Synovitis of left knee  M19.011 (ICD-10-CM) - Primary  osteoarthritis, right shoulder  M54.50 (ICD-10-CM) - Low back pain, unspecified back pain laterality, unspecified chronicity, unspecified whether sciatica present    THERAPY DIAG:  Other low back pain  Chronic right shoulder pain  Chronic pain of left knee  Muscle weakness (generalized)  Rationale for Evaluation and Treatment: Rehabilitation  ONSET DATE: see subjective, on-going for years  SUBJECTIVE:  SUBJECTIVE STATEMENT: The thumb is better with brace. The back is stiff in morning then okay (~3/10).  The back of capsule shoulder that can be triggered by lifting or moving something (does not have to be over his head).   EVAL:  Pt stating he has been painting a lot at home and feels it may have exacerbated his Rt shoulder. Pt reporting radiation pain down his arm but no numbness/tingling. Pt stating Rt shoulder is giving him the most problem. Pt stating after getting the second shingles shot his shoulder pain increased into tricep and shoulder. Pt stating pain has been ongoing for about a year. Pt stating pain is worse at night.  Pt stating left knee pain has been ongoing since January 2018 when he was skiing and twisted his knee and he tore his quadriceps tendon. Pt stating he fell and when he fell he tore both tendons in bil LE. Pt then stating the repair didn't take on his left LE and it was repaired again. Pt stating he had PT and he gained ROM back in bil LE.  Pt reporting falling while in hospital during a bed transfer where he landed on his floor on his back in 2018. Pt stating his back has bothered him on/off ever since.  Pt stating he hasn't been able to work out over the past few months due to recent move into [place].  PERTINENT HISTORY: HTN, severe aortic stenosis, abnormality of gait, h/o gout, rupture of quadriceps tendon on Rt 2018 on left 2018, subdural hematoma 2016, h/o falls, primary OA in bil hands Polio on Left side affecting Left UE strength  PAIN:  NPRS  scale:  at rest 0/10 and up to 7-8/10 in his Rt shoulder Pain description: achy, throbbing Aggravating factors: resting, tylenol , voltaren  Relieving factors: Reaching, overhead function, spine flexion and slouched postures  Low Back pain:  at rest 0/10 and up to 5-6/10 more to left side of lumbar.   PRECAUTIONS: None  WEIGHT BEARING RESTRICTIONS: No  FALLS:  Has patient fallen in last 6 months? No falls in last few years. Haven't fell since he tore his quad tendons and during a hospital transfer several years ago  LIVING ENVIRONMENT: Lives with: lives with their family and lives with an adult companion Lives in: House/apartment Stairs: Yes: Internal: 17 steps; on left going up and External: 2 steps; on left going up Has following equipment at home: None  OCCUPATION: retired  PLOF: Independent  PATIENT GOALS: Be able to work out at gym with less pain  Next MD visit:   OBJECTIVE:   DIAGNOSTIC FINDINGS: Shoulder: 08/06/23  3 views of the right shoulder including Grashey, scapular Y and axial view  were ordered and reviewed by myself.  X-rays show severe osteoarthritis of  the right glenohumeral joint.  There is a large inferior spur off the  humeral head.  There is some chondrocalcinosis superior to the humeral  head, likely indicative of pseudogout or prior gout.  No acute bony  fracture noted.  There is bony sclerosis noted of the humeral head and  inferior acetabulum from likely arthritic change.   Knee 05/22/23:   1. Postsurgical changes of quadriceps tendon repair. The tendon appears intact. Resolution of the prior small fluid bright signal within the midsubstance of the quadriceps tendon insertion on prior 10/01/2018 MRI. 2. Stable chronic oblique tear of the anterior horn of the lateral meniscus. Mild degenerative undersurface fraying of the posterior horn of the lateral meniscus. 3. Interval worsening of moderate to severe patellofemoral cartilage degenerative  changes. 4. Mildly worsened cartilage loss within the anteromedial aspect of the weight-bearing lateral femoral condyle and adjacent lateral tibial plateau. 5. Unchanged high-grade partial to full-thickness cartilage loss within the weight-bearing medial femoral condyle.  Lumbar: 05/22/23:  AP lateral radiographs lumbar spine reviewed.  Grade 1-2 spondylolisthesis present at L5-S1 with pars fracture noted at the corresponding level.   Mild to moderate degenerative changes present in the upper and and middle lumbar spine region.  Visualized hips without arthritis   PATIENT SURVEYS:  12/05/23: FOTO intake:  49%  predicted:  51% 12/31/23: FOTO update 48%  02/27/24: FOTO update 57% 04/22/24: FOTO 59.5%  COGNITION: Overall cognitive status: WFL    SENSATION: WFL  POSTURE:  rounded shoulders, forward head, and decreased lumbar lordosis  PALPATION: TTP: distal left quad tendon, Rt upper trap and levator and anterior shoulder  MMT   Right 12/04/23 Left 12/04/23 Rt  / Left 12/18/23 HHD ppsi Right 02/29/24 Rt 03/17/24 Rt 03/26/24 Lt 03/26/24 Rt  04/22/24 Left 04/22/24  Hip flexion 5 5         Hip extension 5 5         Hip abduction 5 5         Hip adduction 5 5         Hip internal rotation           Hip external rotation           Knee flexion 5 5 52.5 / 40.0   64.1 51.0 60.0, 59.9 58.6, 57.6  Knee extension 5 5 54.4 / 65.5    52.1 64.3 74.2, 75.0  66.2, 61.8                  SHOULDER            Shoulder flexion 4 3         Shoulder abduction 4 3         Shoulder ER 4- 3  23.0 pounds 27.4 pounds 28.9 pounds  17.8, 18.5   Shoulder IR 4- 3  33.4 pounds 39.4 pounds 39.6 pounds  38.0, 37.6    (Blank rows = not tested)    ROM  LE Right 12/04/23 Left 12/04/23 Rt  01/22/24 Left 01/22/24 Active supine 02/18/24 Rt  Active standing 02/29/24 Right 05/08/24 Right/left 05/27/24 Rt / Left Active  supine  Hip flexion 108 110 112 112  140      Hip extension          Hip abduction           Hip adduction          Hip internal rotation          Hip external rotation          Knee flexion 134 126        Knee extension 0 0        Ankle Dorsiflexion       Seated knee flexed  active Right: -7* Left: 0* Standing knee ext Right: -11* Left:  -5*    SHOULDER:            Shoulder flexion 138 158 145 155 155 150  156 / 150  Shoulder abduction 125 140   130     Shoulder ER 30 60   52 50  54 / 52  Shoulder IR      20  50 / 55  Shoulder horizontal adduction      35     (Blank rows = not tested)    FUNCTIONAL TESTS:   12/04/23 : 5 time sit to stand: 17.2 seconds no UE support 01/22/24: 5 time sit to stand: 9.2 seconds no UE support 02/18/24:  10.1 seconds no UE support  GAIT: Distance walked: clinic distances, level surface Assistive device utilized: None Level of assistance: Complete Independence Comments: mild forward flexed trunk posture                                                                      _______________________________________                                                                                                  TODAY'S TREATMENT                                                                           DATE:   05/27/2024 Therapeutic Exercise: Nustep; x 10 minutes level 6 seat 10 UE/LE (FOTO performed while on Nustep) Therapeutic Activities: Double leg press: 112# x 20  Single leg press: 62# 2 x 12  NMR:  BERG balance: 46/56 Tandem balance level ground: x 1 minutes with finger support as needed Staggered stance on Airex mat x 1 minute leading with each LE c finger tap support to full hand support as needed for balance.        TODAY'S TREATMENT  DATE:   05/06/2024 Therapeutic Exercise: Nustep; x 10 minutes level  seat 10 UE/LE  Therapeutic Activities: Sitting on green 65cm ball warm up circles;  then overdoor pulleys 90 sec flexion & 90 sec  abduction Tandem stance with intermittent UE touch on sink 30 sec RLE in front & LLE in back; 1st set on floor eyes open 1-2 touches; 2nd set on floor eyes closed with frequent touches; 3rd set on foam eyes open with frequent touches.  Standing on foam feet ~2 apart eyes open head motions right/left, up/down & 2 diagonals  Standing on foam heel raises & toe raise 10 reps ea.  Light BUE support.   Self-Care: PT demo & verbal cues on use TheraCane for shoulder trigger points.  Pt verbalized & return demo understanding.     TREATMENT                                                                           DATE:   05/06/2024 Therapeutic Exercise: Nustep; x 10 minutes level 6 UE/LE Hip hiking at door way x 10 bil LE Standing wall extension elbow on the wall x 10 holding 5-10 sec each TherActivites:  Leg Press: double: 106# 2 x 15, single 56# 2 x 10  Standing on Ariex mat: staggered stance bil x 60 sec Side stepping green TB around knees 20 feet x 4  Manual Percussion device to pt's infraspinatus, sub scapulae and tricep on Rt UE x 8 minutes          PATIENT EDUCATION:  Education details: HEP, POC Person educated: Patient Education method: Programmer, multimedia, Facilities manager, Verbal cues, and Handouts Education comprehension: verbalized understanding, returned demonstration, and verbal cues required  HOME EXERCISE PROGRAM: AAccess Code: [email or web address] URL: [email or web address] Date: 04/30/2024 Prepared by: Grayce Spatz  Exercises - Standing Shoulder Row with Anchored Resistance  - 1 x daily - 7 x weekly - 3 sets - 10 reps - Supine Bridge  - 2 x daily - 7 x weekly - 2 sets - 10 reps - 5 seconds hold - Supine Shoulder External Rotation in 45 Degrees Abduction AAROM with Dowel  - 1 x daily - 7 x weekly - 3 sets - 10 reps - Supine Shoulder Flexion Extension AAROM with Dowel  - 2-3 x daily - 7 x weekly - 2 sets - 10 reps - Seated Small Alternating Straight Leg Lifts with Heel  Touch  - 2 x daily - 7 x weekly - 15-20 reps - Recumbent Bike  - 2-3 x daily - 7 x weekly - Bridge with Hip Abduction and Resistance  - 1-2 x daily - 7 x weekly - 2 sets - 10 reps - Supine Shoulder External Rotation Stretch  - 2 x daily - 7 x weekly - 1 sets - 10-20 reps - 10 seconds hold - Supine Shoulder Internal Rotation  - 2 x daily - 7 x weekly - 1 sets - 10-20 reps - 10 seconds hold - Standing Scapular Retraction  - 5 x daily - 7 x weekly - 1 sets - 5 reps - 5 second hold - Swiss Ball March  - 1 x daily - 7 x weekly - 2 sets - 10 reps - Seated Lateral  Pelvic Tilt on Swiss Ball  - 1 x daily - 7 x weekly - 10 reps - Pelvic Circles on Swiss Ball  - 1 x daily - 7 x weekly - 3 sets - 10 reps - Swiss Ball Knee Extension  - 1 x daily - 7 x weekly - 2 sets - 10 reps - Seated Shoulder Extension and Scapular Retraction with Resistance on Swiss Ball  - 1 x daily - 7 x weekly - 2 sets - 10 reps - Seated Shoulder W External Rotation on Swiss Ball  - 1 x daily - 7 x weekly - 10 reps - Row with Anchored Resistance on [employer]  - 1 x daily - 7 x weekly - 2 sets - 10 reps - Gastroc Stretch on Step  - 1 x daily - 7 x weekly - 1 sets - 3 reps - 30 seconds hold - Heel Toe Raises at [employer]  - 1 x daily - 3-4 x weekly - 1-2 sets - 10 reps - 5 seconds hold - Seated Toe Raise  - 1 x daily - 7 x weekly - 2 sets - 10 reps - 5 seconds hold - Shoulder External Rotation Walkouts with Anchored Resistance  - 1 x daily - 4-5 x weekly - 2 sets - 10 reps - 5 seconds hold - Shoulder Internal Rotation Walkouts with Anchored Resistance  - 1 x daily - 4-5 x weekly - 2 sets - 10 reps - 5 seconds hold - Tandem Stance  - 1 x daily - 4-5 x weekly - 3 sets - 2 reps - 30 seconds hold  ASSESSMENT:  CLINICAL IMPRESSION: Pt reporting improvements since beginning therapy however still reporting low back pain and decreased balance. Pt's BERG 46/56. I have added a new LTG and requesting 6 more PT visits over the next 6 weeks to  focus on LTG's not met.    OBJECTIVE IMPAIRMENTS: difficulty walking, decreased ROM, decreased strength, increased edema, impaired flexibility, impaired UE functional use, postural dysfunction, and pain.   ACTIVITY LIMITATIONS: lifting, bending, standing, squatting, sleeping, stairs, transfers, reach over head, and hygiene/grooming  PARTICIPATION LIMITATIONS: cleaning, community activity, and yard work  PERSONAL FACTORS: 3+ comorbidities: see pertinent history are also affecting patient's functional outcome.   REHAB POTENTIAL: Good  CLINICAL DECISION MAKING: Evolving/moderate complexity  EVALUATION COMPLEXITY: High   GOALS: Goals reviewed with patient? Yes  SHORT TERM GOALS: (target date for Short term goals are 3 weeks 12/25/2023)   1.  Patient will demonstrate independent use of home exercise program to maintain progress from in clinic treatments.  Goal status:MET 02/18/24  2. Pt to improve his 5 time sit to stand to </=  13  seconds with UE support.   Goal Status: MET 02/18/24   LONG TERM GOALS: (target dates for all long term goals are 6 weeks 07/11/24)   1. Patient will demonstrate/report pain at worst less than or equal to 2/10 to facilitate minimal limitation in daily activity secondary to pain symptoms.  Goal status: On Going  05/27/2024 (can reach 7-8/10 at times)   2. Patient will demonstrate independent use of home exercise program to facilitate ability to maintain/progress functional gains from skilled physical therapy services.  Goal status: MET 05/27/24   3. Patient will demonstrate FOTO outcome > or = 51 % to indicate reduced disability due to condition.   49.5% FOTO 02/18/24 Goal status: MET 05/27/24 52.09%   4.  Patient will be able to perform up and down  1 flight of stairs with reciprocal gait pattern with left knee pain and low back pain </= 2/10.   Goal status: On Going  05/27/2024   5.  Patient will demonstrate improved Rt UE flexion to >/= 150 degrees in  order to improve functional mobility.   Goal status: Met 02/29/2024   6.  Pt will improve Rt shoulder ER to >/= 60 deg in order to improve functional mobility.  Goal status: On Going   05/08/2024  7. Pt will improve his BERG balance score to >/=   51 /56 to improve balance and functional mobility.      PLAN:  PT FREQUENCY: 1x/week  PT DURATION: 6 weeks  PLANNED INTERVENTIONS: Can include 02853- PT Re-evaluation, 97110-Therapeutic exercises, 97530- Therapeutic activity, [id number]- Neuromuscular re-education, 97535- Self Care, 97140- Manual therapy, [phone]- Gait training, [phone]- Orthotic Fit/training, [phone]- Canalith repositioning, [id number]- Aquatic Therapy, 97014- Electrical stimulation (unattended), [id number]- Electrical stimulation (manual), [id number]- Vasopneumatic device, [id number]- Ultrasound, [id number]- Traction (mechanical), [id number]- Ionotophoresis 4mg /ml Dexamethasone , Patient/Family education, Balance training, Stair training, Taping, Dry Needling, Joint mobilization, Joint manipulation, Spinal manipulation, Spinal mobilization, Scar mobilization, Vestibular training, Visual/preceptual remediation/compensation, DME instructions, Cryotherapy, and Moist heat.  All performed as medically necessary.  All included unless contraindicated  PLAN FOR NEXT SESSION:  dynamic balance and SLS progression   Re-certification: sent on 05/27/24 for 6 more weeks    Delon JONELLE Lunger, PT, MPT 05/27/2024, 2:42 PM

## 2024-05-27 NOTE — Telephone Encounter (Signed)
 [facility]  We can refer him to Dr. Agarwala if patient is in agreement.  He may have an earlier opening.

## 2024-05-28 NOTE — Therapy (Signed)
 OUTPATIENT OCCUPATIONAL THERAPY TREATMENT NOTE  Patient Name: Jose [name] MRN: [id number] DOB:[birth year], 73 y.o., male Today's Date: 05/30/2024  PCP: Elisabeth Guild, MD REFERRING PROVIDER: Nicholas Bari, MD   END OF SESSION:  OT End of Session - 05/30/24 0939     Visit Number 5    Number of Visits 10    Date for OT Re-Evaluation 06/13/24    Authorization Type Medicare    OT Start Time 0939    OT Stop Time 1011    OT Time Calculation (min) 32 min    Activity Tolerance Patient tolerated treatment well;No increased pain;Patient limited by pain;Patient limited by fatigue    Behavior During Therapy [facility] for tasks assessed/performed              Past Medical History:  Diagnosis Date   Arthritis    R shoulder, great toes, hands- has gout & has injections in knees with Dr. Alvira Josephs    Bicuspid aortic valve with ascending aorta 4.0 to 4.5 cm in diameter    CAD (coronary artery disease)    Cancer (HCC)    skin ca-    GERD (gastroesophageal reflux disease)    Headache    History of hiatal hernia    Hypertension    Prostate cancer (HCC)    S/P TAVR (transcatheter aortic valve replacement)    SDH (subdural hematoma) (HCC)    Severe aortic stenosis    Thoracic ascending aortic aneurysm (HCC)    Vertebral artery aneurysm [facility])    Past Surgical History:  Procedure Laterality Date   BRAIN SURGERY  2016   evacuation of SDH   CARDIAC CATHETERIZATION     CARPAL TUNNEL RELEASE Bilateral    embolization of left vertebral artery aneurysm with pipeline/coil  10/31/2022   GREEN LIGHT LASER TURP (TRANSURETHRAL RESECTION OF PROSTATE  04/27/2017   KNEE ARTHROPLASTY     LEFT HEART CATH AND CORONARY ANGIOGRAPHY N/A 10/08/2019   Procedure: LEFT HEART CATH AND CORONARY ANGIOGRAPHY;  Surgeon: Arnoldo Lapping, MD;  Location: [facility] INVASIVE CV LAB;  Service: Cardiovascular;  Laterality: N/A;   QUADRICEPS TENDON REPAIR Bilateral 06/15/2017   Procedure: REPAIR QUADRICEP TENDON;   Surgeon: Jasmine Mesi, MD;  Location: [facility] OR;  Service: Orthopedics;  Laterality: Bilateral;   SHOULDER SURGERY Right    TEE WITHOUT CARDIOVERSION N/A 07/03/2022   Procedure: TRANSESOPHAGEAL ECHOCARDIOGRAM (TEE);  Surgeon: Loyde Rule, MD;  Location: [facility] ENDOSCOPY;  Service: Cardiovascular;  Laterality: N/A;   TRANSCATHETER AORTIC VALVE REPLACEMENT, TRANSFEMORAL  12/23/2019   TRANSCATHETER AORTIC VALVE REPLACEMENT, TRANSFEMORAL N/A 12/23/2019   Procedure: TRANSCATHETER AORTIC VALVE REPLACEMENT, TRANSFEMORAL;  Surgeon: Arnoldo Lapping, MD;  Location: [facility] OR;  Service: Open Heart Surgery;  Laterality: N/A;   VASCULAR SURGERY     Patient Active Problem List   Diagnosis Date Noted   Hypertension    Severe aortic stenosis 10/08/2019   Candidiasis    Abnormality of gait    Hypoalbuminemia due to protein-calorie malnutrition [facility])    History of gout    Sleep disturbance    Constipation    Rupture of quadriceps tendon, right, sequela 06/19/2017   Quadriceps tendon rupture, left, sequela 06/19/2017   Acute blood loss anemia 06/19/2017   Fall    History of subdural hematoma    Post-operative pain    Recurrent falls    Quadriceps tendon rupture 06/15/2017   Primary osteoarthritis of both knees 02/28/2017   Primary osteoarthritis of both hands 02/28/2017   Uricacidemia 02/28/2017  Pain in joint of left knee 02/07/2017   Idiopathic chronic gout, unspecified site, without tophus (tophi) 11/29/2016   HEMATURIA UNSPECIFIED 01/25/2009   Abdominal pain 01/25/2009   XERODERMA 03/10/2008   UNS ADVRS EFF UNS RX MEDICINAL&BIOLOGICAL SBSTNC 03/10/2008   ADJUSTMENT REACTION WITH PHYSICAL SYMPTOMS 02/14/2008   MUSCLE SPASM 02/14/2008   ACUTE PROSTATITIS 12/16/2007   BREAST LUMP OR MASS, RIGHT 07/12/2007   H/O: RCT (rotator cuff tear) 01/13/2007   GOUT 01/08/2007    ONSET DATE: ~6 weeks onset pain  REFERRING DIAG:  M25.531 (ICD-10-CM) - Pain in right wrist  M25.521 (ICD-10-CM) - Pain  in right elbow  M65.30 (ICD-10-CM) - Trigger finger, unspecified finger, unspecified laterality    THERAPY DIAG:  Pain in right hand  Pain in right elbow  Stiffness of right wrist, not elsewhere classified  Trigger finger, unspecified finger, unspecified laterality  Pain in right wrist  Muscle weakness (generalized)  Rationale for Evaluation and Treatment: Rehabilitation  PERTINENT HISTORY: having problems with: Rt wrist, Rt elbow and trigger finger  currently seeing PT for shoulder pain and problems on the right side he has pain near the base of the right thumb for about 6 weeks now.  He also has triggering in his fingers 3 and 4 of bilateral hands chronically.  He has received injections to the base of the thumb hand and the wrist to help with his pain.  He is wearing a prefabricated wrist and thumb spica brace today.  He does workout frequently, trying to stay active and he is going to the gym after this, he states   PRECAUTIONS: None  RED FLAGS: None   WEIGHT BEARING RESTRICTIONS: Yes recommended to limit weightbearing through the right wrist and hand to 5 pounds or less for the next few weeks if possible.    SUBJECTIVE:   SUBJECTIVE STATEMENT: He states not having any pain or problems with his recent isometric addition, though its only been a day or 2.   PAIN:  Are you having pain? Yes: NPRS scale:  0-1/10 at rest now Pain location: Rt thumb CMC J and MCPJs in  Pain description: Sharp and shooting Aggravating factors: Wrist and thumb motion Relieving factors: Unsure  FALLS: Has patient fallen in last 6 months? No  PLOF: Independent  PATIENT GOALS: To improve pain in the right wrist as well as limit triggering and pain through the fingers and increase functional ability  NEXT MD VISIT: As needed   OBJECTIVE: (All objective assessments below are from initial evaluation on: 05/01/24 unless otherwise specified.)    HAND DOMINANCE: Right   ADLs: Overall  ADLs: States decreased ability to grab, hold household objects, pain and difficulty to open containers, perform FMS tasks (manipulate fasteners on clothing)   FUNCTIONAL OUTCOME MEASURES: Eval: Quick DASH 34% impairment today  (Higher % Score  =  More Impairment)     UPPER EXTREMITY ROM     Shoulder to Wrist AROM Right eval Left eval Rt 05/07/24 Rt 05/27/24  Forearm supination 67 pain   77  Forearm pronation  85   86  Wrist flexion 42 72 32 58  Wrist extension 60 69 50 56  Wrist ulnar deviation      Wrist radial deviation      Functional dart thrower's motion (F-DTM) in ulnar flexion      F-DTM in radial extension       (Blank rows = not tested)   Hand AROM Right eval Left eval  Full Fist Ability (  or Gap to Distal Palmar Crease) Full fist with notable triggering of digits 4 and 3 Full fist with notable triggering of digits 4 and 3  Thumb Opposition  (Kapandji Scale)  9/10 9/10  (Blank rows = not tested)   UPPER EXTREMITY MMT:      MMT Right 05/01/2024  Forearm supination 4 -/5 painful  Forearm pronation 4/5  Wrist flexion 4 -/5 painful  Wrist extension 5/5  Wrist ulnar deviation 5/5  Wrist radial deviation 4 -/5 painful  (Blank rows = not tested)  HAND FUNCTION: Eval: Observed weakness in affected bilateral hands due to triggering and pain.  Strong grip not advised, but this can be tested in the future if he feels better.   COORDINATION: 05/30/24: 9 Hole Peg Test Right: TBD sec, Left: TBD sec (TBD sec is WFL)   Eval: Observed coordination impairments with affected right wrist and arm due to painful tenosynovitis of the thumb tendons.  Details will be tested as needed in upcoming sessions    OBSERVATIONS:   Eval: He has a positive Finkelstein's test on the right wrist and negative on the left wrist.  He is not tender to the [facility] joint of the right thumb.  Supination is tight and painful near the base of the thumb as well.  Today he presents as deQuervain's  Tenosynovitis of the right thumb extensor tendons and triggering (or stenosing tenosynovitis) of the bilateral middle fingers and ring fingers.    TODAY'S TREATMENT:  05/30/24: While he is on moist heat, OT reviews her current plan of care with him including the bolded information below.  He should be very body aware and try not to tip the scales during his activities and stretching to cause pain.  OT does manual therapy IASTM along the sore first dorsal compartment, along the length of the thumb tendons and muscle bellies.  OT then does manual therapy stretches with him reviewing his stretches.  We then worked into new wrist flexion isometrics and also added on gentle wrist extension isometrics.  He tolerates these well without any significant pain.  He was asked to add ice to his arm if he ever feels like he overdid it.  He leaves in no significant pain and states understanding.    Keep massaging, stretching wrist forward, back, down away from the thumb, etc.   semi-frequently.      Adding in light "isometric" strength   (meaning resist your force).  Pushing on your palm when it's faced "up" Pushing on the back of your hand when your palm is down  Do 3 - 5 times each for ~10 seconds lightly   Don't "tip the scale" and cause pain.     PATIENT EDUCATION: Education details: See tx section above for details  Person educated: Patient Education method: Verbal Instruction, Teach back, Handouts  Education comprehension: States and demonstrates understanding, Additional Education required    HOME EXERCISE PROGRAM: Access Code: [email or web address] URL: [email or web address] Date: 05/01/2024 Prepared by: Leartis Proud   GOALS: Goals reviewed with patient? Yes   SHORT TERM GOALS: (STG required if POC>30 days) Target Date: 05/09/2024  Pt will obtain protective, custom orthotic. Goal status: 05/05/2024: Met  2.  Pt will demo/state understanding of initial HEP to improve pain  levels and prerequisite motion. Goal status: INITIAL   LONG TERM GOALS: Target Date: 06/13/2024  Pt will improve functional ability by decreased impairment per Quick DASH assessment from 34% to 15% or better, for better quality of life. Goal status:  INITIAL  2.  Pt will improve grip strength in right hand from weak and painful to at least nonpainful 30 lbs for functional use at home and in IADLs. Goal status: INITIAL  3.  Pt will improve A/ROM in right wrist flexion from painful 42 degrees to at least nonpainful 60 degrees, to have functional motion for tasks like reach and grasp.  Goal status: INITIAL  4.  Pt will improve strength in right wrist radial deviation from painful 4 -/5 MMT to at least 4+/5 MMT to have increased functional ability to carry out selfcare and higher-level homecare tasks with less difficulty. Goal status: INITIAL  5. Pt will decrease pain at worst from 10/10 to 4/10 or better to have better sleep and occupational participation in daily roles. Goal status: INITIAL   ASSESSMENT:  CLINICAL IMPRESSION: 05/30/24: He is doing very well to tolerate light isometric training at the wrist now.  Next week we will try to work into light thumb isometrics or wrist radial deviation isometrics if tolerated well.  05/27/24: Upgrading to light isometric strengthening now.  If tolerated in a week, add thumb light isometric training  05/07/24: He is doing better, having less pain with the new orthosis, but still needs to be cautious for several weeks most likely.  05/05/24: He was apparently doing his stretches too harshly and was causing himself pain, most of which he could do during the session today without pain after retraining.  A custom rigid orthosis was also necessary to help rest the APL and EPB tendons.  Hopefully intermittent stretching will prevent excess stiffness from occurring due to immobilization.    PLAN:  OT FREQUENCY: 1-2x/week  OT DURATION: 6 weeks through  06/13/2024 and up to 10 total visits as needed  PLANNED INTERVENTIONS: 97535 self care/ADL training, 16109 therapeutic exercise, 97530 therapeutic activity, 97112 neuromuscular re-education, 97140 manual therapy, 97035 ultrasound, 97760 Orthotic Initial, 97763 Orthotic/Prosthetic subsequent, Dry needling, coping strategies training, patient/family education, and DME and/or AE instructions  RECOMMENDED OTHER SERVICES: In PT for shoulder pain and problems currently  CONSULTED AND AGREED WITH PLAN OF CARE: Patient  PLAN FOR NEXT SESSION:   Continue with manual therapy and warming up and he and stretches then advancing isometric or eccentric strengthening.  Leartis Proud, OTR/L, CHT 05/30/2024, 10:16 AM

## 2024-05-30 ENCOUNTER — Ambulatory Visit (INDEPENDENT_AMBULATORY_CARE_PROVIDER_SITE_OTHER): Admitting: Rehabilitative and Restorative Service Providers"

## 2024-05-30 ENCOUNTER — Encounter: Payer: Self-pay | Admitting: Rehabilitative and Restorative Service Providers"

## 2024-05-30 DIAGNOSIS — M25631 Stiffness of right wrist, not elsewhere classified: Secondary | ICD-10-CM | POA: Diagnosis not present

## 2024-05-30 DIAGNOSIS — M25521 Pain in right elbow: Secondary | ICD-10-CM

## 2024-05-30 DIAGNOSIS — M6530 Trigger finger, unspecified finger: Secondary | ICD-10-CM

## 2024-05-30 DIAGNOSIS — M6281 Muscle weakness (generalized): Secondary | ICD-10-CM

## 2024-05-30 DIAGNOSIS — M79641 Pain in right hand: Secondary | ICD-10-CM | POA: Diagnosis not present

## 2024-05-30 DIAGNOSIS — M25531 Pain in right wrist: Secondary | ICD-10-CM

## 2024-05-30 NOTE — Therapy (Signed)
 OUTPATIENT OCCUPATIONAL THERAPY TREATMENT NOTE  Patient Name: Jose [name] MRN: [id number] DOB:[birth year], 73 y.o., male Today's Date: 06/03/2024  PCP: Birda Ahumada, MD REFERRING PROVIDER: [name] Reiter, MD   END OF SESSION:  OT End of Session - 06/03/24 1102     Visit Number 6    Number of Visits 10    Date for OT Re-Evaluation 06/13/24    Authorization Type Medicare    OT Start Time 1102    OT Stop Time 1133    OT Time Calculation (min) 31 min    Activity Tolerance Patient tolerated treatment well;No increased pain;Patient limited by pain;Patient limited by fatigue    Behavior During Therapy [facility] for tasks assessed/performed               Past Medical History:  Diagnosis Date   Arthritis    R shoulder, great toes, hands- has gout & has injections in knees with Dr. [name]    Bicuspid aortic valve with ascending aorta 4.0 to 4.5 cm in diameter    CAD (coronary artery disease)    Cancer (HCC)    skin ca-    GERD (gastroesophageal reflux disease)    Headache    History of hiatal hernia    Hypertension    Prostate cancer (HCC)    S/P TAVR (transcatheter aortic valve replacement)    SDH (subdural hematoma) (HCC)    Severe aortic stenosis    Thoracic ascending aortic aneurysm (HCC)    Vertebral artery aneurysm [facility])    Past Surgical History:  Procedure Laterality Date   BRAIN SURGERY  2016   evacuation of SDH   CARDIAC CATHETERIZATION     CARPAL TUNNEL RELEASE Bilateral    embolization of left vertebral artery aneurysm with pipeline/coil  10/31/2022   GREEN LIGHT LASER TURP (TRANSURETHRAL RESECTION OF PROSTATE  04/27/2017   KNEE ARTHROPLASTY     LEFT HEART CATH AND CORONARY ANGIOGRAPHY N/A 10/08/2019   Procedure: LEFT HEART CATH AND CORONARY ANGIOGRAPHY;  Surgeon: Wonda Sharper, MD;  Location: [facility] INVASIVE CV LAB;  Service: Cardiovascular;  Laterality: N/A;   QUADRICEPS TENDON REPAIR Bilateral 06/15/2017   Procedure: REPAIR QUADRICEP TENDON;   Surgeon: Addie Cordella Glendia, MD;  Location: [facility] OR;  Service: Orthopedics;  Laterality: Bilateral;   SHOULDER SURGERY Right    TEE WITHOUT CARDIOVERSION N/A 07/03/2022   Procedure: TRANSESOPHAGEAL ECHOCARDIOGRAM (TEE);  Surgeon: Delford Maude BROCKS, MD;  Location: [facility] ENDOSCOPY;  Service: Cardiovascular;  Laterality: N/A;   TRANSCATHETER AORTIC VALVE REPLACEMENT, TRANSFEMORAL  12/23/2019   TRANSCATHETER AORTIC VALVE REPLACEMENT, TRANSFEMORAL N/A 12/23/2019   Procedure: TRANSCATHETER AORTIC VALVE REPLACEMENT, TRANSFEMORAL;  Surgeon: Wonda Sharper, MD;  Location[facility] OR;  Service: Open Heart Surgery;  Laterality: N/A;   VASCULAR SURGERY     Patient Active Problem List   Diagnosis Date Noted   Hypertension    Severe aortic stenosis 10/08/2019   Candidiasis    Abnormality of gait    Hypoalbuminemia due to protein-calorie malnutrition [facility])    History of gout    Sleep disturbance    Constipation    Rupture of quadriceps tendon, right, sequela 06/19/2017   Quadriceps tendon rupture, left, sequela 06/19/2017   Acute blood loss anemia 06/19/2017   Fall    History of subdural hematoma    Post-operative pain    Recurrent falls    Quadriceps tendon rupture 06/15/2017   Primary osteoarthritis of both knees 02/28/2017   Primary osteoarthritis of both hands 02/28/2017   Uricacidemia 02/28/2017  Pain in joint of left knee 02/07/2017   Idiopathic chronic gout, unspecified site, without tophus (tophi) 11/29/2016   HEMATURIA UNSPECIFIED 01/25/2009   Abdominal pain 01/25/2009   XERODERMA 03/10/2008   UNS ADVRS EFF UNS RX MEDICINAL&BIOLOGICAL SBSTNC 03/10/2008   ADJUSTMENT REACTION WITH PHYSICAL SYMPTOMS 02/14/2008   MUSCLE SPASM 02/14/2008   ACUTE PROSTATITIS 12/16/2007   BREAST LUMP OR MASS, RIGHT 07/12/2007   H/O: RCT (rotator cuff tear) 01/13/2007   GOUT 01/08/2007    ONSET DATE: ~6 weeks onset pain  REFERRING DIAG:  M25.531 (ICD-10-CM) - Pain in right wrist  M25.521 (ICD-10-CM) - Pain  in right elbow  M65.30 (ICD-10-CM) - Trigger finger, unspecified finger, unspecified laterality    THERAPY DIAG:  Pain in right hand  Pain in right elbow  Stiffness of right wrist, not elsewhere classified  Trigger finger, unspecified finger, unspecified laterality  Pain in right wrist  Muscle weakness (generalized)  Rationale for Evaluation and Treatment: Rehabilitation  PERTINENT HISTORY: having problems with: Rt wrist, Rt elbow and trigger finger  currently seeing PT for shoulder pain and problems on the right side he has pain near the base of the right thumb for about 6 weeks now.  He also has triggering in his fingers 3 and 4 of bilateral hands chronically.  He has received injections to the base of the thumb hand and the wrist to help with his pain.  He is wearing a prefabricated wrist and thumb spica brace today.  He does workout frequently, trying to stay active and he is going to the gym after this, he states   PRECAUTIONS: None  RED FLAGS: None   WEIGHT BEARING RESTRICTIONS: Yes recommended to limit weightbearing through the right wrist and hand to 5 pounds or less for the next few weeks if possible.    SUBJECTIVE:   SUBJECTIVE STATEMENT: He states he was out of his orthosis at least 60% of the weekend, doing a lot of cutting and chopping up of food, not wearing orthosis at night, and doing new strengthening-all of these things led to an exacerbation of pain and he now has 6 out of 10 pain.    PAIN:  Are you having pain? Yes: NPRS scale:  6/10 at rest now Pain location: Rt thumb CMC J and MCPJs in  Pain description: Sharp and shooting Aggravating factors: Wrist and thumb motion Relieving factors: Unsure  FALLS: Has patient fallen in last 6 months? No  PLOF: Independent  PATIENT GOALS: To improve pain in the right wrist as well as limit triggering and pain through the fingers and increase functional ability  NEXT MD VISIT: As needed   OBJECTIVE: (All  objective assessments below are from initial evaluation on: 05/01/24 unless otherwise specified.)    HAND DOMINANCE: Right   ADLs: Overall ADLs: States decreased ability to grab, hold household objects, pain and difficulty to open containers, perform FMS tasks (manipulate fasteners on clothing)   FUNCTIONAL OUTCOME MEASURES: Eval: Quick DASH 34% impairment today  (Higher % Score  =  More Impairment)     UPPER EXTREMITY ROM     Shoulder to Wrist AROM Right eval Left eval Rt 05/07/24 Rt 05/27/24  Forearm supination 67 pain   77  Forearm pronation  85   86  Wrist flexion 42 72 32 58  Wrist extension 60 69 50 56  Wrist ulnar deviation      Wrist radial deviation      Functional dart thrower's motion (F-DTM) in ulnar flexion  F-DTM in radial extension       (Blank rows = not tested)   Hand AROM Right eval Left eval  Full Fist Ability (or Gap to Distal Palmar Crease) Full fist with notable triggering of digits 4 and 3 Full fist with notable triggering of digits 4 and 3  Thumb Opposition  (Kapandji Scale)  9/10 9/10  (Blank rows = not tested)   UPPER EXTREMITY MMT:      MMT Right 05/01/2024  Forearm supination 4 -/5 painful  Forearm pronation 4/5  Wrist flexion 4 -/5 painful  Wrist extension 5/5  Wrist ulnar deviation 5/5  Wrist radial deviation 4 -/5 painful  (Blank rows = not tested)  HAND FUNCTION: Eval: Observed weakness in affected bilateral hands due to triggering and pain.  Strong grip not advised, but this can be tested in the future if he feels better.   COORDINATION: 05/30/24: 9 Hole Peg Test Right: TBD sec, Left: TBD sec (TBD sec is WFL)   Eval: Observed coordination impairments with affected right wrist and arm due to painful tenosynovitis of the thumb tendons.  Details will be tested as needed in upcoming sessions    OBSERVATIONS:   Eval: He has a positive Finkelstein's test on the right wrist and negative on the left wrist.  He is not tender to  the [facility] joint of the right thumb.  Supination is tight and painful near the base of the thumb as well.  Today he presents as deQuervain's Tenosynovitis of the right thumb extensor tendons and triggering (or stenosing tenosynovitis) of the bilateral middle fingers and ring fingers.    TODAY'S TREATMENT:  06/03/24: Because he had an exacerbation we talked about the cumulative effect of trying to wean from the orthosis as well as doing new strengthening as well as doing more increased functional activities and how that likely pushed him over the edge.  Today we focused on pain relief using moist heat for 3 minutes and then manual therapy IASTM and myofascial release around the first dorsal compartment.  OT then reviews the stretches with him and advises to continue to stretch nonpainful he for 5 times a day, be in his orthosis more often now and definitely every night.  He can try very gentle strengthening if tolerated and we did indeed do isometric wrist extension and flexion today, just very lightly.  He did tolerate it well and though he was tender at the end of the session his pain was down.    PATIENT EDUCATION: Education details: See tx section above for details  Person educated: Patient Education method: Verbal Instruction, Teach back, Handouts  Education comprehension: States and demonstrates understanding, Additional Education required    HOME EXERCISE PROGRAM: Access Code: [email or web address] URL: [email or web address] Date: 05/01/2024 Prepared by: Melvenia Ada   GOALS: Goals reviewed with patient? Yes   SHORT TERM GOALS: (STG required if POC>30 days) Target Date: 05/09/2024  Pt will obtain protective, custom orthotic. Goal status: 05/05/2024: Met  2.  Pt will demo/state understanding of initial HEP to improve pain levels and prerequisite motion. Goal status: INITIAL   LONG TERM GOALS: Target Date: 06/13/2024  Pt will improve functional ability by decreased impairment  per Quick DASH assessment from 34% to 15% or better, for better quality of life. Goal status: INITIAL  2.  Pt will improve grip strength in right hand from weak and painful to at least nonpainful 30 lbs for functional use at home and in IADLs. Goal status: INITIAL  3.  Pt will improve A/ROM in right wrist flexion from painful 42 degrees to at least nonpainful 60 degrees, to have functional motion for tasks like reach and grasp.  Goal status: INITIAL  4.  Pt will improve strength in right wrist radial deviation from painful 4 -/5 MMT to at least 4+/5 MMT to have increased functional ability to carry out selfcare and higher-level homecare tasks with less difficulty. Goal status: INITIAL  5. Pt will decrease pain at worst from 10/10 to 4/10 or better to have better sleep and occupational participation in daily roles. Goal status: INITIAL   ASSESSMENT:  CLINICAL IMPRESSION: 06/03/24: He had an exacerbation of pain from a cumulative effect of not wearing his orthosis much, doing a lot of repetitive activities, and starting strengthening.  We need to slow down a bit and allow more rest this week    PLAN:  OT FREQUENCY: 1-2x/week  OT DURATION: 6 weeks through 06/13/2024 and up to 10 total visits as needed  PLANNED INTERVENTIONS: 97535 self care/ADL training, 02889 therapeutic exercise, 97530 therapeutic activity, 97112 neuromuscular re-education, 97140 manual therapy, 97035 ultrasound, 97760 Orthotic Initial, 97763 Orthotic/Prosthetic subsequent, Dry needling, coping strategies training, patient/family education, and DME and/or AE instructions  RECOMMENDED OTHER SERVICES: In PT for shoulder pain and problems currently  CONSULTED AND AGREED WITH PLAN OF CARE: Patient  PLAN FOR NEXT SESSION:   See him back next week and hopefully he has gotten his exacerbation under control and we can move forward with light gentle strengthening   Melvenia Ada, OTR/L, CHT 06/03/2024, 11:38 AM

## 2024-06-03 ENCOUNTER — Encounter: Payer: Self-pay | Admitting: Rehabilitative and Restorative Service Providers"

## 2024-06-03 ENCOUNTER — Ambulatory Visit (INDEPENDENT_AMBULATORY_CARE_PROVIDER_SITE_OTHER): Admitting: Rehabilitative and Restorative Service Providers"

## 2024-06-03 DIAGNOSIS — M6530 Trigger finger, unspecified finger: Secondary | ICD-10-CM | POA: Diagnosis not present

## 2024-06-03 DIAGNOSIS — M25521 Pain in right elbow: Secondary | ICD-10-CM

## 2024-06-03 DIAGNOSIS — M25531 Pain in right wrist: Secondary | ICD-10-CM

## 2024-06-03 DIAGNOSIS — M79641 Pain in right hand: Secondary | ICD-10-CM

## 2024-06-03 DIAGNOSIS — M6281 Muscle weakness (generalized): Secondary | ICD-10-CM

## 2024-06-03 DIAGNOSIS — M25631 Stiffness of right wrist, not elsewhere classified: Secondary | ICD-10-CM | POA: Diagnosis not present

## 2024-06-05 ENCOUNTER — Encounter: Payer: Self-pay | Admitting: Physical Therapy

## 2024-06-05 ENCOUNTER — Ambulatory Visit (INDEPENDENT_AMBULATORY_CARE_PROVIDER_SITE_OTHER): Admitting: Physical Therapy

## 2024-06-05 DIAGNOSIS — M6281 Muscle weakness (generalized): Secondary | ICD-10-CM | POA: Diagnosis not present

## 2024-06-05 DIAGNOSIS — M25511 Pain in right shoulder: Secondary | ICD-10-CM

## 2024-06-05 DIAGNOSIS — M25562 Pain in left knee: Secondary | ICD-10-CM | POA: Diagnosis not present

## 2024-06-05 DIAGNOSIS — G8929 Other chronic pain: Secondary | ICD-10-CM | POA: Diagnosis not present

## 2024-06-05 NOTE — Therapy (Signed)
 OUTPATIENT PHYSICAL THERAPY TREATMENT    Patient Name: Jose [name] MRN: [id number] DOB:1951-10-07, 73 y.o., male Today's Date: 06/05/2024  END OF SESSION:  PT End of Session - 06/05/24 0849     Visit Number 19    Number of Visits 23    Date for PT Re-Evaluation 07/11/24    Authorization Type Re-certification for 1x/ week for 6 more weeks from 05/27/24 to 07/11/24    PT Start Time 0847    PT Stop Time 0930    PT Time Calculation (min) 43 min    Activity Tolerance Patient tolerated treatment well;No increased pain    Behavior During Therapy WFL for tasks assessed/performed                       Past Medical History:  Diagnosis Date   Arthritis    R shoulder, great toes, hands- has gout & has injections in knees with Dr. Alvira Josephs    Bicuspid aortic valve with ascending aorta 4.0 to 4.5 cm in diameter    CAD (coronary artery disease)    Cancer (HCC)    skin ca-    GERD (gastroesophageal reflux disease)    Headache    History of hiatal hernia    Hypertension    Prostate cancer (HCC)    S/P TAVR (transcatheter aortic valve replacement)    SDH (subdural hematoma) (HCC)    Severe aortic stenosis    Thoracic ascending aortic aneurysm (HCC)    Vertebral artery aneurysm [facility])    Past Surgical History:  Procedure Laterality Date   BRAIN SURGERY  2016   evacuation of SDH   CARDIAC CATHETERIZATION     CARPAL TUNNEL RELEASE Bilateral    embolization of left vertebral artery aneurysm with pipeline/coil  10/31/2022   GREEN LIGHT LASER TURP (TRANSURETHRAL RESECTION OF PROSTATE  04/27/2017   KNEE ARTHROPLASTY     LEFT HEART CATH AND CORONARY ANGIOGRAPHY N/A 10/08/2019   Procedure: LEFT HEART CATH AND CORONARY ANGIOGRAPHY;  Surgeon: Arnoldo Lapping, MD;  Location: [facility] INVASIVE CV LAB;  Service: Cardiovascular;  Laterality: N/A;   QUADRICEPS TENDON REPAIR Bilateral 06/15/2017   Procedure: REPAIR QUADRICEP TENDON;  Surgeon: Jasmine Mesi, MD;  Location: [facility] OR;   Service: Orthopedics;  Laterality: Bilateral;   SHOULDER SURGERY Right    TEE WITHOUT CARDIOVERSION N/A 07/03/2022   Procedure: TRANSESOPHAGEAL ECHOCARDIOGRAM (TEE);  Surgeon: Loyde Rule, MD;  Location: [facility] ENDOSCOPY;  Service: Cardiovascular;  Laterality: N/A;   TRANSCATHETER AORTIC VALVE REPLACEMENT, TRANSFEMORAL  12/23/2019   TRANSCATHETER AORTIC VALVE REPLACEMENT, TRANSFEMORAL N/A 12/23/2019   Procedure: TRANSCATHETER AORTIC VALVE REPLACEMENT, TRANSFEMORAL;  Surgeon: Arnoldo Lapping, MD;  Location: [facility] OR;  Service: Open Heart Surgery;  Laterality: N/A;   VASCULAR SURGERY     Patient Active Problem List   Diagnosis Date Noted   Hypertension    Severe aortic stenosis 10/08/2019   Candidiasis    Abnormality of gait    Hypoalbuminemia due to protein-calorie malnutrition [facility])    History of gout    Sleep disturbance    Constipation    Rupture of quadriceps tendon, right, sequela 06/19/2017   Quadriceps tendon rupture, left, sequela 06/19/2017   Acute blood loss anemia 06/19/2017   Fall    History of subdural hematoma    Post-operative pain    Recurrent falls    Quadriceps tendon rupture 06/15/2017   Primary osteoarthritis of both knees 02/28/2017   Primary osteoarthritis of both hands 02/28/2017  Uricacidemia 02/28/2017   Pain in joint of left knee 02/07/2017   Idiopathic chronic gout, unspecified site, without tophus (tophi) 11/29/2016   HEMATURIA UNSPECIFIED 01/25/2009   Abdominal pain 01/25/2009   XERODERMA 03/10/2008   UNS ADVRS EFF UNS RX MEDICINAL&BIOLOGICAL SBSTNC 03/10/2008   ADJUSTMENT REACTION WITH PHYSICAL SYMPTOMS 02/14/2008   MUSCLE SPASM 02/14/2008   ACUTE PROSTATITIS 12/16/2007   BREAST LUMP OR MASS, RIGHT 07/12/2007   H/O: RCT (rotator cuff tear) 01/13/2007   GOUT 01/08/2007    PCP: Elisabeth Guild, MD   REFERRING PROVIDER: Nicholas Bari, MD   REFERRING DIAG:  [phone] (ICD-10-CM) - Synovitis of left knee  M19.011 (ICD-10-CM) - Primary  osteoarthritis, right shoulder  M54.50 (ICD-10-CM) - Low back pain, unspecified back pain laterality, unspecified chronicity, unspecified whether sciatica present    THERAPY DIAG:  Muscle weakness (generalized)  Chronic right shoulder pain  Chronic pain of left knee  Rationale for Evaluation and Treatment: Rehabilitation  ONSET DATE: see subjective, on-going for years  SUBJECTIVE:  SUBJECTIVE STATEMENT: His hand is so-so. It was getting better but over did it.  The right (dominant) shoulder is painful both below & above shoulder area (more triceps area). His back has been better.  He has been more consistent with exercises now that he is not traveling as much.    EVAL:  Pt stating he has been painting a lot at home and feels it may have exacerbated his Rt shoulder. Pt reporting radiation pain down his arm but no numbness/tingling. Pt stating Rt shoulder is giving him the most problem. Pt stating after getting the second shingles shot his shoulder pain increased into tricep and shoulder. Pt stating pain has been ongoing for about a year. Pt stating pain is worse at night.  Pt stating left knee pain has been ongoing since January 2018 when he was skiing and twisted his knee and he tore his quadriceps tendon. Pt stating he fell and when he fell he tore both tendons in bil LE. Pt then stating the repair didn't take on his left LE and it was repaired again. Pt stating he had PT and he gained ROM back in bil LE.  Pt reporting falling while in hospital during a bed transfer where he landed on his floor on his back in 2018. Pt stating his back has bothered him on/off ever since.  Pt stating he hasn't been able to work out over the past few months due to recent move into [place].  PERTINENT HISTORY: HTN, severe aortic stenosis, abnormality of gait, h/o gout, rupture of quadriceps tendon on Rt 2018 on left 2018, subdural hematoma 2016, h/o falls, primary OA in bil hands Polio on Left side  affecting Left UE strength  PAIN:  Rt shoulder NPRS scale:  at rest 0/10 and can be no pain until moves it a certain way then flash up to 8/10. Sometimes goes away or linger 4-6/10 Pain description: achy, throbbing Aggravating factors: resting, tylenol , voltaren  Relieving factors: Reaching, overhead function, spine flexion and slouched postures  Low Back pain:  at rest 0/10 and up to 3-4/10 more to left side of lumbar.   PRECAUTIONS: None  WEIGHT BEARING RESTRICTIONS: No  FALLS:  Has patient fallen in last 6 months? No falls in last few years. Haven't fell since he tore his quad tendons and during a hospital transfer several years ago  LIVING ENVIRONMENT: Lives with: lives with their family and lives with an adult companion Lives in: House/apartment Stairs: Yes: Internal: 17 steps;  on left going up and External: 2 steps; on left going up Has following equipment at home: None  OCCUPATION: retired  PLOF: Independent  PATIENT GOALS: Be able to work out at gym with less pain  Next MD visit:   OBJECTIVE:   DIAGNOSTIC FINDINGS: Shoulder: 08/06/23  3 views of the right shoulder including Grashey, scapular Y and axial view  were ordered and reviewed by myself.  X-rays show severe osteoarthritis of  the right glenohumeral joint.  There is a large inferior spur off the  humeral head.  There is some chondrocalcinosis superior to the humeral  head, likely indicative of pseudogout or prior gout.  No acute bony  fracture noted.  There is bony sclerosis noted of the humeral head and  inferior acetabulum from likely arthritic change.   Knee 05/22/23:   1. Postsurgical changes of quadriceps tendon repair. The tendon appears intact. Resolution of the prior small fluid bright signal within the midsubstance of the quadriceps tendon insertion on prior 10/01/2018 MRI. 2. Stable chronic oblique tear of the anterior horn of the lateral meniscus. Mild degenerative undersurface fraying of the  posterior horn of the lateral meniscus. 3. Interval worsening of moderate to severe patellofemoral cartilage degenerative changes. 4. Mildly worsened cartilage loss within the anteromedial aspect of the weight-bearing lateral femoral condyle and adjacent lateral tibial plateau. 5. Unchanged high-grade partial to full-thickness cartilage loss within the weight-bearing medial femoral condyle.  Lumbar: 05/22/23:  AP lateral radiographs lumbar spine reviewed.  Grade 1-2 spondylolisthesis present at L5-S1 with pars fracture noted at the corresponding level.   Mild to moderate degenerative changes present in the upper and and middle lumbar spine region.  Visualized hips without arthritis   PATIENT SURVEYS:  12/05/23: FOTO intake:  49%  predicted:  51% 12/31/23: FOTO update 48%  02/27/24: FOTO update 57% 04/22/24: FOTO 59.5%  COGNITION: Overall cognitive status: WFL    SENSATION: WFL  POSTURE:  rounded shoulders, forward head, and decreased lumbar lordosis  PALPATION: TTP: distal left quad tendon, Rt upper trap and levator and anterior shoulder  MMT   Right 12/04/23 Left 12/04/23 Rt  / Left 12/18/23 HHD ppsi Right 02/29/24 Rt 03/17/24 Rt 03/26/24 Lt 03/26/24 Rt  04/22/24 Left 04/22/24  Hip flexion 5 5         Hip extension 5 5         Hip abduction 5 5         Hip adduction 5 5         Hip internal rotation           Hip external rotation           Knee flexion 5 5 52.5 / 40.0   64.1 51.0 60.0, 59.9 58.6, 57.6  Knee extension 5 5 54.4 / 65.5    52.1 64.3 74.2, 75.0  66.2, 61.8                  SHOULDER            Shoulder flexion 4 3         Shoulder abduction 4 3         Shoulder ER 4- 3  23.0 pounds 27.4 pounds 28.9 pounds  17.8, 18.5   Shoulder IR 4- 3  33.4 pounds 39.4 pounds 39.6 pounds  38.0, 37.6    (Blank rows = not tested)    ROM  LE Right 12/04/23 Left 12/04/23 Rt  01/22/24 Left 01/22/24 Active supine 02/18/24  Rt  Active standing 02/29/24 Right  05/08/24 Right/left 05/27/24 Rt / Left Active  supine  Hip flexion 108 110 112 112 140      Hip extension          Hip abduction          Hip adduction          Hip internal rotation          Hip external rotation          Knee flexion 134 126        Knee extension 0 0        Ankle Dorsiflexion       Seated knee flexed  active Right: -7* Left: 0* Standing knee ext Right: -11* Left:  -5*    SHOULDER:            Shoulder flexion 138 158 145 155 155 150  156 / 150  Shoulder abduction 125 140   130     Shoulder ER 30 60   52 50  54 / 52  Shoulder IR      20  50 / 55  Shoulder horizontal adduction      35     (Blank rows = not tested)    FUNCTIONAL TESTS:   12/04/23 : 5 time sit to stand: 17.2 seconds no UE support 01/22/24: 5 time sit to stand: 9.2 seconds no UE support 02/18/24:  10.1 seconds no UE support  GAIT: Distance walked: clinic distances, level surface Assistive device utilized: None Level of assistance: Complete Independence Comments: mild forward flexed trunk posture                                                                      _______________________________________                                                                                                  TODAY'S TREATMENT                                                                           DATE:   06/05/2024 Therapeutic Exercise:  Nustep; x 10 minutes level 6 seat 10 UE/LE for 5 min & LEs only 5 min.  Cues on full ROM.   Therapeutic Activities: Stairs (left ascending rail like his home) PT demo & verbal cues on technique 11 steps descending 1st flight step-to pattern alternating lead LE, 2nd flight alternating pattern.  Ascending both flights alternating pattern.  Leg press PT cues for full knee ext but slow controlled motion: BLEs  112# x 20 reps; marching 100# 5 reps 3 sets to work on terminal knee ext stabilization;  Single leg press: 62# 2 sets x 12 reps ea LE  NMR:  With sink and chair  back support to simulate safe home set up.  Tandem balance RLE in front & in back for 30 sec.  level ground / floor eyes open with intermittent touch. level ground / floor eyes closed with frequent touch.  Staggered stance on Airex matRLE in front & in back for 30 sec RLE in front & in back for 30 sec.      TREATMENT                                                                           DATE:   05/27/2024 Therapeutic Exercise: Nustep; x 10 minutes level 6 seat 10 UE/LE Therapeutic Activities: Double leg press: 112# x 20  Single leg press: 62# 2 x 12  NMR:  BERG balance: 46/56 Tandem balance level ground: x 1 minutes with finger support as needed Staggered stance on Airex mat x 1 minute leading with each LE c finger tap support to full hand support as needed for balance.    TREATMENT                                                                           DATE:   05/06/2024 Therapeutic Exercise: Nustep; x 10 minutes level  seat 10 UE/LE  Therapeutic Activities: Sitting on green 65cm ball warm up circles;  then overdoor pulleys 90 sec flexion & 90 sec abduction Tandem stance with intermittent UE touch on sink 30 sec RLE in front & LLE in back; 1st set on floor eyes open 1-2 touches; 2nd set on floor eyes closed with frequent touches; 3rd set on foam eyes open with frequent touches.  Standing on foam feet ~2 apart eyes open head motions right/left, up/down & 2 diagonals  Standing on foam heel raises & toe raise 10 reps ea.  Light BUE support.   Self-Care: PT demo & verbal cues on use TheraCane for shoulder trigger points.  Pt verbalized & return demo understanding.       PATIENT EDUCATION:  Education details: HEP, POC Person educated: Patient Education method: Programmer, multimedia, Demonstration, Verbal cues, and Handouts Education comprehension: verbalized understanding, returned demonstration, and verbal cues required  HOME EXERCISE PROGRAM: AAccess Code: [email or web address] URL:  [email or web address] Date: 04/30/2024 Prepared by: Lorie Rook  Exercises - Standing Shoulder Row with Anchored Resistance  - 1 x daily - 7 x weekly - 3 sets - 10 reps - Supine Bridge  - 2 x daily - 7 x weekly - 2 sets - 10 reps - 5 seconds hold - Supine Shoulder External Rotation in 45 Degrees Abduction AAROM with Dowel  - 1 x daily - 7 x weekly - 3 sets - 10 reps - Supine Shoulder Flexion Extension AAROM with Dowel  - 2-3 x daily -  7 x weekly - 2 sets - 10 reps - Seated Small Alternating Straight Leg Lifts with Heel Touch  - 2 x daily - 7 x weekly - 15-20 reps - Recumbent Bike  - 2-3 x daily - 7 x weekly - Bridge with Hip Abduction and Resistance  - 1-2 x daily - 7 x weekly - 2 sets - 10 reps - Supine Shoulder External Rotation Stretch  - 2 x daily - 7 x weekly - 1 sets - 10-20 reps - 10 seconds hold - Supine Shoulder Internal Rotation  - 2 x daily - 7 x weekly - 1 sets - 10-20 reps - 10 seconds hold - Standing Scapular Retraction  - 5 x daily - 7 x weekly - 1 sets - 5 reps - 5 second hold - Swiss Ball March  - 1 x daily - 7 x weekly - 2 sets - 10 reps - Seated Lateral Pelvic Tilt on Swiss Ball  - 1 x daily - 7 x weekly - 10 reps - Pelvic Circles on [employer]  - 1 x daily - 7 x weekly - 3 sets - 10 reps - Swiss Ball Knee Extension  - 1 x daily - 7 x weekly - 2 sets - 10 reps - Seated Shoulder Extension and Scapular Retraction with Resistance on Swiss Ball  - 1 x daily - 7 x weekly - 2 sets - 10 reps - Seated Shoulder W External Rotation on [employer]  - 1 x daily - 7 x weekly - 10 reps - Row with Anchored Resistance on [employer]  - 1 x daily - 7 x weekly - 2 sets - 10 reps - Gastroc Stretch on Step  - 1 x daily - 7 x weekly - 1 sets - 3 reps - 30 seconds hold - Heel Toe Raises at [employer]  - 1 x daily - 3-4 x weekly - 1-2 sets - 10 reps - 5 seconds hold - Seated Toe Raise  - 1 x daily - 7 x weekly - 2 sets - 10 reps - 5 seconds hold - Shoulder External Rotation Walkouts  with Anchored Resistance  - 1 x daily - 4-5 x weekly - 2 sets - 10 reps - 5 seconds hold - Shoulder Internal Rotation Walkouts with Anchored Resistance  - 1 x daily - 4-5 x weekly - 2 sets - 10 reps - 5 seconds hold - Tandem Stance  - 1 x daily - 4-5 x weekly - 3 sets - 2 reps - 30 seconds hold  ASSESSMENT:  CLINICAL IMPRESSION: Patient improved technique for negotiating stairs with instructions.  PT instructed in full range for exercise equipment and appears to understand rationale.    OBJECTIVE IMPAIRMENTS: difficulty walking, decreased ROM, decreased strength, increased edema, impaired flexibility, impaired UE functional use, postural dysfunction, and pain.   ACTIVITY LIMITATIONS: lifting, bending, standing, squatting, sleeping, stairs, transfers, reach over head, and hygiene/grooming  PARTICIPATION LIMITATIONS: cleaning, community activity, and yard work  PERSONAL FACTORS: 3+ comorbidities: see pertinent history are also affecting patient's functional outcome.   REHAB POTENTIAL: Good  CLINICAL DECISION MAKING: Evolving/moderate complexity  EVALUATION COMPLEXITY: High   GOALS: Goals reviewed with patient? Yes  SHORT TERM GOALS: (target date for Short term goals are 3 weeks 12/25/2023)   1.  Patient will demonstrate independent use of home exercise program to maintain progress from in clinic treatments.  Goal status:MET 02/18/24  2. Pt to improve his 5 time sit to stand to </=  13  seconds with UE support.   Goal Status: MET 02/18/24   LONG TERM GOALS: (target dates for all long term goals are 6 weeks 07/11/24)   1. Patient will demonstrate/report pain at worst less than or equal to 2/10 to facilitate minimal limitation in daily activity secondary to pain symptoms.  Goal status: On Going  06/05/2024 (can reach 7-8/10 at times)   2. Patient will demonstrate independent use of home exercise program to facilitate ability to maintain/progress functional gains from skilled physical  therapy services.  Goal status: MET 05/27/24   3. Patient will demonstrate FOTO outcome > or = 51 % to indicate reduced disability due to condition.   49.5% FOTO 02/18/24 Goal status: MET 05/27/24 52.09%   4.  Patient will be able to perform up and down 1 flight of stairs with reciprocal gait pattern with left knee pain and low back pain </= 2/10.   Goal status: On Going  06/05/2024   5.  Patient will demonstrate improved Rt UE flexion to >/= 150 degrees in order to improve functional mobility.   Goal status: Met 02/29/2024   6.  Pt will improve Rt shoulder ER to >/= 60 deg in order to improve functional mobility.  Goal status: On Going   06/05/2024  7. Pt will improve his BERG balance score to >/=   51 /56 to improve balance and functional mobility.   Goal status: On Going   06/05/2024    PLAN:  PT FREQUENCY: 1x/week  PT DURATION: 6 weeks  PLANNED INTERVENTIONS: Can include 69629- PT Re-evaluation, 97110-Therapeutic exercises, 97530- Therapeutic activity, 97112- Neuromuscular re-education, 97535- Self Care, 97140- Manual therapy, [phone]- Gait training, [phone]- Orthotic Fit/training, [phone]- Canalith repositioning, [id number]- Aquatic Therapy, 97014- Electrical stimulation (unattended), [id number]- Electrical stimulation (manual), [id number]- Vasopneumatic device, [id number]- Ultrasound, [id number]- Traction (mechanical), [id number]- Ionotophoresis 4mg /ml Dexamethasone , Patient/Family education, Balance training, Stair training, Taping, Dry Needling, Joint mobilization, Joint manipulation, Spinal manipulation, Spinal mobilization, Scar mobilization, Vestibular training, Visual/preceptual remediation/compensation, DME instructions, Cryotherapy, and Moist heat.  All performed as medically necessary.  All included unless contraindicated  PLAN FOR NEXT SESSION:  work towards updated LTGs, dynamic balance and SLS progression   Re-certification: sent on 05/27/24 for 6 more weeks    Lorie Rook, PT, DPT 06/05/2024, 12:49  PM

## 2024-06-09 NOTE — Therapy (Signed)
 OUTPATIENT OCCUPATIONAL THERAPY TREATMENT & PROGRESS  NOTE  Patient Name: Jose [name] MRN: [id number] DOB:07/04/1951, 73 y.o., male Today's Date: 06/10/2024  PCP: Birda Ahumada, MD REFERRING PROVIDER: [name] Reiter, MD                       Progress Note Reporting Period 05/01/24 to 06/10/24     CLINICAL IMPRESSION: 06/10/24: Unfortunately Jose [name] has had an exacerbation in the past 2 weeks consecutively in his not been able to move forward with her plan of care.  Therapy has worked to lower his pain in the past, however, and so he wishes to continue therapy and he will try to be more thoughtful about not taking ditches or causing exacerbations.  Etc. OT agrees with this plan and agrees to try 6 more weeks of therapy up to 2 times a week to ensure he is not having these exacerbations of pain and to hopefully move into strengthening sooner than later to help prevent exacerbations in the future.   PLAN:  OT FREQUENCY: 1-2x/week  OT DURATION: 6 additional weeks from 06/13/2024 - 07/25/24 and up to 20 total visits as needed   See note below for Objective Data and Assessment of Progress/Goals.                    END OF SESSION:  OT End of Session - 06/10/24 1103     Visit Number 7    Number of Visits 20    Date for OT Re-Evaluation 07/25/24    Authorization Type Medicare    OT Start Time 1103    OT Stop Time 1145    OT Time Calculation (min) 42 min    Activity Tolerance Patient tolerated treatment well;No increased pain;Patient limited by pain;Patient limited by fatigue    Behavior During Therapy [facility] for tasks assessed/performed          Past Medical History:  Diagnosis Date   Arthritis    R shoulder, great toes, hands- has gout & has injections in knees with Dr. [name]    Bicuspid aortic valve with ascending aorta 4.0 to 4.5 cm in diameter    CAD (coronary artery disease)    Cancer (HCC)    skin ca-    GERD (gastroesophageal  reflux disease)    Headache    History of hiatal hernia    Hypertension    Prostate cancer (HCC)    S/P TAVR (transcatheter aortic valve replacement)    SDH (subdural hematoma) (HCC)    Severe aortic stenosis    Thoracic ascending aortic aneurysm (HCC)    Vertebral artery aneurysm [facility])    Past Surgical History:  Procedure Laterality Date   BRAIN SURGERY  2016   evacuation of SDH   CARDIAC CATHETERIZATION     CARPAL TUNNEL RELEASE Bilateral    embolization of left vertebral artery aneurysm with pipeline/coil  10/31/2022   GREEN LIGHT LASER TURP (TRANSURETHRAL RESECTION OF PROSTATE  04/27/2017   KNEE ARTHROPLASTY     LEFT HEART CATH AND CORONARY ANGIOGRAPHY N/A 10/08/2019   Procedure: LEFT HEART CATH AND CORONARY ANGIOGRAPHY;  Surgeon: Wonda Sharper, MD;  [facility] INVASIVE CV LAB;  Service: Cardiovascular;  Laterality: N/A;   QUADRICEPS TENDON REPAIR Bilateral 06/15/2017   Procedure: REPAIR QUADRICEP TENDON;  Surgeon: Addie Cordella Glendia, MD;  Location: [facility] OR;  Service: Orthopedics;  Laterality: Bilateral;   SHOULDER SURGERY Right    TEE WITHOUT CARDIOVERSION N/A 07/03/2022   Procedure:  TRANSESOPHAGEAL ECHOCARDIOGRAM (TEE);  Surgeon: Delford Maude BROCKS, MD;  Location: [facility] ENDOSCOPY;  Service: Cardiovascular;  Laterality: N/A;   TRANSCATHETER AORTIC VALVE REPLACEMENT, TRANSFEMORAL  12/23/2019   TRANSCATHETER AORTIC VALVE REPLACEMENT, TRANSFEMORAL N/A 12/23/2019   Procedure: TRANSCATHETER AORTIC VALVE REPLACEMENT, TRANSFEMORAL;  Surgeon: Wonda Sharper, MD;  Location: [facility] OR;  Service: Open Heart Surgery;  Laterality: N/A;   VASCULAR SURGERY     Patient Active Problem List   Diagnosis Date Noted   Hypertension    Severe aortic stenosis 10/08/2019   Candidiasis    Abnormality of gait    Hypoalbuminemia due to protein-calorie malnutrition (HCC)    History of gout    Sleep disturbance    Constipation    Rupture of quadriceps tendon, right, sequela 06/19/2017   Quadriceps tendon  rupture, left, sequela 06/19/2017   Acute blood loss anemia 06/19/2017   Fall    History of subdural hematoma    Post-operative pain    Recurrent falls    Quadriceps tendon rupture 06/15/2017   Primary osteoarthritis of both knees 02/28/2017   Primary osteoarthritis of both hands 02/28/2017   Uricacidemia 02/28/2017   Pain in joint of left knee 02/07/2017   Idiopathic chronic gout, unspecified site, without tophus (tophi) 11/29/2016   HEMATURIA UNSPECIFIED 01/25/2009   Abdominal pain 01/25/2009   XERODERMA 03/10/2008   UNS ADVRS EFF UNS RX MEDICINAL&BIOLOGICAL SBSTNC 03/10/2008   ADJUSTMENT REACTION WITH PHYSICAL SYMPTOMS 02/14/2008   MUSCLE SPASM 02/14/2008   ACUTE PROSTATITIS 12/16/2007   BREAST LUMP OR MASS, RIGHT 07/12/2007   H/O: RCT (rotator cuff tear) 01/13/2007   GOUT 01/08/2007    ONSET DATE: ~6 weeks onset pain  REFERRING DIAG:  M25.531 (ICD-10-CM) - Pain in right wrist  M25.521 (ICD-10-CM) - Pain in right elbow  M65.30 (ICD-10-CM) - Trigger finger, unspecified finger, unspecified laterality    THERAPY DIAG:  Pain in right elbow  Stiffness of right wrist, not elsewhere classified  Rationale for Evaluation and Treatment: Rehabilitation  PERTINENT HISTORY: having problems with: Rt wrist, Rt elbow and trigger finger  currently seeing PT for shoulder pain and problems on the right side he has pain near the base of the right thumb for about 6 weeks now.  He also has triggering in his fingers 3 and 4 of bilateral hands chronically.  He has received injections to the base of the thumb hand and the wrist to help with his pain.  He is wearing a prefabricated wrist and thumb spica brace today.  He does workout frequently, trying to stay active and he is going to the gym after this, he states   PRECAUTIONS: None  RED FLAGS: None   WEIGHT BEARING RESTRICTIONS: Yes recommended to limit weightbearing through the right wrist and hand to 5 pounds or less for the next few  weeks if possible.    SUBJECTIVE:   SUBJECTIVE STATEMENT: He states he knows that he hurt himself over the past week when he dug a few holes in his yard.   He did wear his orthosis, but he did feel some pain and see some swelling after this.  His pain has come down somewhat after.     PAIN:  Are you having pain? Yes: NPRS scale: 4/10 at rest now Pain location: Rt thumb CMC J and MCPJs in  Pain description: Sharp and shooting Aggravating factors: Wrist and thumb motion Relieving factors: Unsure  FALLS: Has patient fallen in last 6 months? No  PLOF: Independent  PATIENT GOALS: To improve  pain in the right wrist as well as limit triggering and pain through the fingers and increase functional ability  NEXT MD VISIT: As needed   OBJECTIVE: (All objective assessments below are from initial evaluation on: 05/01/24 unless otherwise specified.)    HAND DOMINANCE: Right   ADLs: Overall ADLs: States decreased ability to grab, hold household objects, pain and difficulty to open containers, perform FMS tasks (manipulate fasteners on clothing)   FUNCTIONAL OUTCOME MEASURES: 06/10/24: Quick DASH: 22.7%  impairment today    Eval: Quick DASH 34% impairment today  (Higher % Score  =  More Impairment)     UPPER EXTREMITY ROM     Shoulder to Wrist AROM Right eval Left eval Rt 05/07/24 Rt 05/27/24 Rt 06/10/24  Forearm supination 67 pain   77 69  Forearm pronation  85   86 85  Wrist flexion 42 72 32 58 50  Wrist extension 60 69 50 56 56  Wrist ulnar deviation       Wrist radial deviation       (Blank rows = not tested)   Hand AROM Right eval Left eval  Full Fist Ability (or Gap to Distal Palmar Crease) Full fist with notable triggering of digits 4 and 3 Full fist with notable triggering of digits 4 and 3  Thumb Opposition  (Kapandji Scale)  9/10 9/10  (Blank rows = not tested)   UPPER EXTREMITY MMT:      MMT Right 05/01/2024  Forearm supination 4 -/5 painful  Forearm  pronation 4/5  Wrist flexion 4 -/5 painful  Wrist extension 5/5  Wrist ulnar deviation 5/5  Wrist radial deviation 4 -/5 painful  (Blank rows = not tested)  HAND FUNCTION: 06/10/24: TBD- withheld due to exacerbation of pain in past week   Eval: Observed weakness in affected bilateral hands due to triggering and pain.  Strong grip not advised, but this can be tested in the future if he feels better.   COORDINATION: 06/10/24: 9 Hole Peg Test Right: TBD sec (TBD sec is WFL)   Eval: Observed coordination impairments with affected right wrist and arm due to painful tenosynovitis of the thumb tendons.  Details will be tested as needed in upcoming sessions    OBSERVATIONS:   Eval: He has a positive Finkelstein's test on the right wrist and negative on the left wrist.  He is not tender to the [facility] joint of the right thumb.  Supination is tight and painful near the base of the thumb as well.  Today he presents as deQuervain's Tenosynovitis of the right thumb extensor tendons and triggering (or stenosing tenosynovitis) of the bilateral middle fingers and ring fingers.    TODAY'S TREATMENT:  06/10/24: Due to recent exacerbation, we again have a safety discussion about not causing pain and how that will cause a tenosynovitis to recur/worse and linger.   We also withhold strengthening due to that today.  As it's the end of our therapy certification, we check LTGs and unfortunately, due to 2 weeks of exacerbation, he has not met LTGs, and he would like to continue therapy more frequently to encourage rest and pain relief techniques.  Today, OT does manual therapy IASTM for pain relief which is effective, then perofrms stretches with him, with review. This helps lower his pain, and he does state understanding HEP, safety precautions.     PATIENT EDUCATION: Education details: See tx section above for details  Person educated: Patient Education method: Verbal Instruction, Teach back, Handouts  Education  comprehension: States and demonstrates understanding, Additional Education required    HOME EXERCISE PROGRAM: Access Code: [email or web address] URL: [email or web address] Date: 05/01/2024 Prepared by: Melvenia Ada   GOALS: Goals reviewed with patient? Yes   SHORT TERM GOALS: (STG required if POC>30 days) Target Date: 05/09/2024  Pt will obtain protective, custom orthotic. Goal status: 05/05/2024: Met  2.  Pt will demo/state understanding of initial HEP to improve pain levels and prerequisite motion. Goal status: 06/10/24: MET   LONG TERM GOALS: Target Date: 07/25/24  Pt will improve functional ability by decreased impairment per Quick DASH assessment from 34% to 15% or better, for better quality of life. Goal status: 06/10/24: improved to 22.7% now   2.  Pt will improve grip strength in right hand from weak and painful to at least nonpainful 30 lbs for functional use at home and in IADLs. Goal status: 06/10/24: NT due to exacerbation   3.  Pt will improve A/ROM in right wrist flexion from painful 42 degrees to at least nonpainful 60 degrees, to have functional motion for tasks like reach and grasp.  Goal status:06/10/24: improved to 50* now   4.  Pt will improve strength in right wrist radial deviation from painful 4 -/5 MMT to at least 4+/5 MMT to have increased functional ability to carry out selfcare and higher-level homecare tasks with less difficulty. Goal status: 06/10/24: NT due to exacerbation   5. Pt will decrease pain at worst from 10/10 to 4/10 or better to have better sleep and occupational participation in daily roles. Goal status:06/10/24: improving, but still having exacerbations    ASSESSMENT:  CLINICAL IMPRESSION: 06/10/24: Unfortunately Jose [name] has had an exacerbation in the past 2 weeks consecutively in his not been able to move forward with her plan of care.  Therapy has worked to lower his pain in the past, however, and so he wishes to continue therapy and  he will try to be more thoughtful about not taking ditches or causing exacerbations.  Etc. OT agrees with this plan and agrees to try 6 more weeks of therapy up to 2 times a week to ensure he is not having these exacerbations of pain and to hopefully move into strengthening sooner than later to help prevent exacerbations in the future.   PLAN:  OT FREQUENCY: 1-2x/week  OT DURATION: 6 additional weeks from 06/13/2024 - 07/25/24 and up to 20 total visits as needed  PLANNED INTERVENTIONS: 97535 self care/ADL training, 02889 therapeutic exercise, 97530 therapeutic activity, 97112 neuromuscular re-education, 97140 manual therapy, 97035 ultrasound, 97760 Orthotic Initial, 97763 Orthotic/Prosthetic subsequent, Dry needling, coping strategies training, patient/family education, and DME and/or AE instructions  RECOMMENDED OTHER SERVICES: In PT for shoulder pain and problems currently  CONSULTED AND AGREED WITH PLAN OF CARE: Patient  PLAN FOR NEXT SESSION:   Continue on focusing on pain relief, prevention of pain, mobility-eventually working into hand and arm strengthening as tolerated.  Melvenia Ada, OTR/L, CHT 06/10/2024, 1:46 PM

## 2024-06-10 ENCOUNTER — Telehealth: Payer: Self-pay | Admitting: Cardiovascular Disease

## 2024-06-10 ENCOUNTER — Encounter: Payer: Self-pay | Admitting: Rehabilitative and Restorative Service Providers"

## 2024-06-10 ENCOUNTER — Ambulatory Visit (INDEPENDENT_AMBULATORY_CARE_PROVIDER_SITE_OTHER): Admitting: Rehabilitative and Restorative Service Providers"

## 2024-06-10 DIAGNOSIS — M25521 Pain in right elbow: Secondary | ICD-10-CM

## 2024-06-10 DIAGNOSIS — I340 Nonrheumatic mitral (valve) insufficiency: Secondary | ICD-10-CM

## 2024-06-10 DIAGNOSIS — Z952 Presence of prosthetic heart valve: Secondary | ICD-10-CM

## 2024-06-10 DIAGNOSIS — M25631 Stiffness of right wrist, not elsewhere classified: Secondary | ICD-10-CM

## 2024-06-10 NOTE — Telephone Encounter (Signed)
  Patient would like to know if he will need to have an Echo done before his yearly follow up in October. No orders at this time but he would like to do it before the appt if it is needed. Please advise.

## 2024-06-11 NOTE — Progress Notes (Unsigned)
 Office Visit Note  Patient: Jose [name]             Date of Birth: 1951-07-28           MRN: [id number]             PCP: Birda Ahumada, MD Referring: Birda Ahumada, MD Visit Date: 06/25/2024 Occupation: @GUAROCC @  Subjective:  Medication monitoring  History of Present Illness: Jose [name] is a 73 y.o. male with history of gout and osteoarthritis.  He is taking allopurinol  300 mg daily.  He is tolerating allopurinol  without any side effects and has not had any recent gaps in therapy.  He denies any signs or symptoms of a gout flare.  He keeps colchicine  on hand to take as needed during flares. Patient states that his primary concern remains the level of pain in his right [facility] joint as well as the trigger fingers involving his right middle and ring finger.  He was evaluated by Dr. Arlinda today to discuss proceeding with surgical intervention in the future.  According to the patient he is unable to see Dr. Camella until November 2025 to discuss surgical intervention.  He has been using a brace at times as well as tape..   Activities of Daily Living:  Patient reports morning stiffness for less than 5 minutes.   Patient Reports nocturnal pain.  Difficulty dressing/grooming: Denies Difficulty climbing stairs: Reports Difficulty getting out of chair: Denies Difficulty using hands for taps, buttons, cutlery, and/or writing: Denies  Review of Systems  Constitutional:  Negative for fatigue.  HENT:  Negative for mouth sores and mouth dryness.   Eyes:  Negative for dryness.  Respiratory:  Negative for shortness of breath.   Cardiovascular:  Negative for chest pain and palpitations.  Gastrointestinal:  Negative for blood in stool, constipation and diarrhea.  Endocrine: Positive for increased urination.  Genitourinary:  Negative for involuntary urination.  Musculoskeletal:  Positive for joint pain, joint pain, joint swelling, myalgias, morning stiffness, muscle tenderness and  myalgias. Negative for gait problem and muscle weakness.  Skin:  Negative for color change, rash, hair loss and sensitivity to sunlight.  Allergic/Immunologic: Negative for susceptible to infections.  Neurological:  Negative for dizziness and headaches.  Hematological:  Negative for swollen glands.  Psychiatric/Behavioral:  Negative for depressed mood and sleep disturbance. The patient is not nervous/anxious.     PMFS History:  Patient Active Problem List   Diagnosis Date Noted   Hypertension    Severe aortic stenosis 10/08/2019   Candidiasis    Abnormality of gait    Hypoalbuminemia due to protein-calorie malnutrition [facility])    History of gout    Sleep disturbance    Constipation    Rupture of quadriceps tendon, right, sequela 06/19/2017   Quadriceps tendon rupture, left, sequela 06/19/2017   Acute blood loss anemia 06/19/2017   Fall    History of subdural hematoma    Post-operative pain    Recurrent falls    Quadriceps tendon rupture 06/15/2017   Primary osteoarthritis of both knees 02/28/2017   Primary osteoarthritis of both hands 02/28/2017   Uricacidemia 02/28/2017   Pain in joint of left knee 02/07/2017   Idiopathic chronic gout, unspecified site, without tophus (tophi) 11/29/2016   HEMATURIA UNSPECIFIED 01/25/2009   Abdominal pain 01/25/2009   XERODERMA 03/10/2008   UNS ADVRS EFF UNS RX MEDICINAL&BIOLOGICAL SBSTNC 03/10/2008   ADJUSTMENT REACTION WITH PHYSICAL SYMPTOMS 02/14/2008   MUSCLE SPASM 02/14/2008   ACUTE PROSTATITIS 12/16/2007  BREAST LUMP OR MASS, RIGHT 07/12/2007   H/O: RCT (rotator cuff tear) 01/13/2007   GOUT 01/08/2007    Past Medical History:  Diagnosis Date   Arthritis    R shoulder, great toes, hands- has gout & has injections in knees with Dr. [name]    Bicuspid aortic valve with ascending aorta 4.0 to 4.5 cm in diameter    CAD (coronary artery disease)    Cancer (HCC)    skin ca-    GERD (gastroesophageal reflux disease)    Headache     History of hiatal hernia    Hypertension    Prostate cancer (HCC)    S/P TAVR (transcatheter aortic valve replacement)    SDH (subdural hematoma) (HCC)    Severe aortic stenosis    Thoracic ascending aortic aneurysm (HCC)    Vertebral artery aneurysm (HCC)     Family History  Problem Relation Age of Onset   Parkinson's disease Mother    Heart disease Father    Parkinson's disease Brother    Diabetes Brother    Past Surgical History:  Procedure Laterality Date   BRAIN SURGERY  2016   evacuation of SDH   CARDIAC CATHETERIZATION     CARPAL TUNNEL RELEASE Bilateral    embolization of left vertebral artery aneurysm with pipeline/coil  10/31/2022   GREEN LIGHT LASER TURP (TRANSURETHRAL RESECTION OF PROSTATE  04/27/2017   KNEE ARTHROPLASTY     LEFT HEART CATH AND CORONARY ANGIOGRAPHY N/A 10/08/2019   Procedure: LEFT HEART CATH AND CORONARY ANGIOGRAPHY;  Surgeon: Wonda Sharper, MD;  Location: [facility] INVASIVE CV LAB;  Service: Cardiovascular;  Laterality: N/A;   QUADRICEPS TENDON REPAIR Bilateral 06/15/2017   Procedure: REPAIR QUADRICEP TENDON;  Surgeon: Addie Cordella Hamilton, MD;  Location: [facility] OR;  Service: Orthopedics;  Laterality: Bilateral;   SHOULDER SURGERY Right    TEE WITHOUT CARDIOVERSION N/A 07/03/2022   Procedure: TRANSESOPHAGEAL ECHOCARDIOGRAM (TEE);  Surgeon: Delford Maude BROCKS, MD;  Location: [facility] ENDOSCOPY;  Service: Cardiovascular;  Laterality: N/A;   TRANSCATHETER AORTIC VALVE REPLACEMENT, TRANSFEMORAL  12/23/2019   TRANSCATHETER AORTIC VALVE REPLACEMENT, TRANSFEMORAL N/A 12/23/2019   Procedure: TRANSCATHETER AORTIC VALVE REPLACEMENT, TRANSFEMORAL;  Surgeon: Wonda Sharper, MD;  Location: [facility] OR;  Service: Open Heart Surgery;  Laterality: N/A;   VASCULAR SURGERY     Social History   Social History Narrative   Not on file   Immunization History  Administered Date(s) Administered   PFIZER(Purple Top)SARS-COV-2 Vaccination 02/19/2020, 03/17/2020, 10/25/2020, 08/30/2021    Pfizer Fall 2024 Covid-19 Vaccine 64yrs thru 73yrs. 10/04/2022     Objective: Vital Signs: BP 94/67 (BP Location: Left Arm, Patient Position: Sitting, Cuff Size: Normal)   Pulse 65   Resp 14   Ht 6' (1.829 m)   Wt 199 lb (90.3 kg)   BMI 26.99 kg/m    Physical Exam Vitals and nursing note reviewed.  Constitutional:      Appearance: He is well-developed.  HENT:     Head: Normocephalic and atraumatic.  Eyes:     Conjunctiva/sclera: Conjunctivae normal.     Pupils: Pupils are equal, round, and reactive to light.  Cardiovascular:     Rate and Rhythm: Normal rate and regular rhythm.     Heart sounds: Normal heart sounds.  Pulmonary:     Effort: Pulmonary effort is normal.     Breath sounds: Normal breath sounds.  Abdominal:     General: Bowel sounds are normal.     Palpations: Abdomen is soft.  Musculoskeletal:  Cervical back: Normal range of motion and neck supple.  Skin:    General: Skin is warm and dry.     Capillary Refill: Capillary refill takes less than 2 seconds.  Neurological:     Mental Status: He is alert and oriented to person, place, and time.  Psychiatric:        Behavior: Behavior normal.      Musculoskeletal Exam: C-spine has good range of motion.  Both shoulders have limited abduction to about 45 degrees.  Elbow joints have good range of motion with no tenderness along the joint line.  Wrist joints have good range of motion with no synovitis.  Tenderness of the right CMC joint.  PIP and DIP thickening.  Triggering of the right middle and ring finger.  Hip joints have good range of motion with no groin pain.  Knee joints have good range of motion with no effusion.  Ankle joints have good range of motion with no tenderness or joint swelling.  CDAI Exam: CDAI Score: -- Patient Global: --; Provider Global: -- Swollen: --; Tender: -- Joint Exam 06/25/2024   No joint exam has been documented for this visit   There is currently no information documented on  the homunculus. Go to the Rheumatology activity and complete the homunculus joint exam.  Investigation: No additional findings.  Imaging: No results found.  Recent Labs: Lab Results  Component Value Date   WBC 7.7 12/27/2023   HGB 14.8 12/27/2023   PLT 156 12/27/2023   NA 142 12/27/2023   K 4.5 12/27/2023   CL 105 12/27/2023   CO2 27 12/27/2023   GLUCOSE 84 12/27/2023   BUN 21 12/27/2023   CREATININE 1.21 12/27/2023   BILITOT 0.5 12/27/2023   ALKPHOS 72 09/11/2023   AST 19 12/27/2023   ALT 17 12/27/2023   PROT 6.7 12/27/2023   ALBUMIN 4.2 09/11/2023   CALCIUM  9.9 12/27/2023   GFRAA 70 05/05/2021    Speciality Comments: No specialty comments available.  Procedures:  No procedures performed Allergies: Ciprofloxacin and Quinolones     Assessment / Plan:     Visit Diagnoses: Idiopathic chronic gout of multiple sites without tophus - He has not had any signs or symptoms of a gout flare.  He has clinically been doing well taking allopurinol  300 mg 1 tablet by mouth daily.  He is tolerating allopurinol  without any side effects and has not had any gaps in therapy.  He takes colchicine  only on an as-needed basis if he develops symptoms of a gout flare.  His uric acid level was within desirable range: 3.5 on 12/27/2023.  Uric acid along with CBC, CMP will be updated today.  No medication changes will be made at this time.  He was advised to notify us  if he develops signs or symptoms of a gout flare.  He will follow-up in the office in 6 months or sooner if needed. Plan: Uric acid  Medication monitoring encounter -Allopurinol  300 mg 1 tablet daily. Uric acid 3.5 on 12/27/2023. CBC and CMP drawn on 12/27/2023. Orders for CBC, CMP, and uric acid released today.Plan: Uric acid, CBC with Differential/Platelet, Comprehensive metabolic panel with GFR  Chronic right shoulder pain - He had right glenohumeral joint injection by Dr. Lonell Sprang on August 06, 2023 for severe osteoarthritis.   Limited abduction to about 45 degrees.  Chronic left shoulder pain: He had a left glenohumeral joint cortisone injection performed on 02/16/2024.  Limited abduction to about 45 degrees.  Primary osteoarthritis of  both hands: He had a right intercarpal injection performed on 03/21/2024 and a right CMC joint injection performed on 04/10/2024.  The patient's primary concern remains the level of pain and stiffness in the right CMC joint. A referral to Dr. Camella was previously placed but he is unable to be seen until November 2025.  He was evaluated by Dr. Arlinda today to discuss surgical intervention but he is considering still keeping his appointment with Dr. Camella for a second opinion.   Trigger finger, right ring finger - He had 2 injections and A1 pulley and 1 injection in A3 pulley. right ring finger A1 pulley region was injected 12/23.   Recurrent-inadequate response to conservative treatment.  He established care with Dr. Arlinda today--surgical invention was discussed.  Trigger middle finger of right hand: Chronic pain and locking.  Tried conservative treatment measures but continues to have a recurrence of symptoms. He established care with Dr. Arlinda today-surgical intervention was discussed.  Trigger little finger of right hand - He had 2 injections and A1 pulley and 1 injection in A3 pulley. right ring finger A1 pulley region was injected 12/23. Not currently symptomatic.   Primary osteoarthritis of both knees - Left knee joint injected by Dr. Addie in July 2024.  Patient had no recurrence of pain since the cortisone injection.  No warmth or effusion noted.  Quadriceps tendon rupture, left, sequela: Good range of motion with no warmth or effusion.  Rupture of quadriceps tendon, right, sequela: Good range of motion with no warmth or effusion.  Primary osteoarthritis of both feet: He has good range of motion of both ankle joints with no tenderness or synovitis.  Other medical conditions  are listed as follows:  Neuropathy  Subdural hematoma (HCC)  H/O aortic valve replacement  [place]  Orders: Orders Placed This Encounter  Procedures   Uric acid   CBC with Differential/Platelet   Comprehensive metabolic panel with GFR   Meds ordered this encounter  Medications   allopurinol  (ZYLOPRIM ) 300 MG tablet    Sig: Take 1 tablet (300 mg total) by mouth daily.    Dispense:  90 tablet    Refill:  0    Follow-Up Instructions: Return in about 6 months (around 12/26/2024) for Gout, Osteoarthritis.   Waddell CHRISTELLA Craze, PA-C  Note - This record has been created using Dragon software.  Chart creation errors have been sought, but may not always  have been located. Such creation errors do not reflect on  the standard of medical care.

## 2024-06-13 NOTE — Telephone Encounter (Signed)
 Jose Maude BROCKS, MD to Me   06/12/24 10:29 PM Ok to order echo for TAVR, EF and MR  Called patient to let him know that Echo will be ordered and we will try to schedule on the same day as his office visit. Patient verbalized understanding.

## 2024-06-16 NOTE — Therapy (Signed)
 OUTPATIENT OCCUPATIONAL THERAPY TREATMENT  NOTE  Patient Name: Jose [name] MRN: [id number] DOB:05/22/1951, 73 y.o., male Today's Date: 06/17/2024  PCP: Birda Ahumada, MD REFERRING PROVIDER: [name] Reiter, MD     END OF SESSION:  OT End of Session - 06/17/24 1108     Visit Number 8    Number of Visits 20    Date for OT Re-Evaluation 07/25/24    Authorization Type Medicare    OT Start Time 1108    OT Stop Time 1147    OT Time Calculation (min) 39 min    Activity Tolerance Patient tolerated treatment well;No increased pain;Patient limited by pain;Patient limited by fatigue    Behavior During Therapy [facility] for tasks assessed/performed           Past Medical History:  Diagnosis Date   Arthritis    R shoulder, great toes, hands- has gout & has injections in knees with Dr. [name]    Bicuspid aortic valve with ascending aorta 4.0 to 4.5 cm in diameter    CAD (coronary artery disease)    Cancer (HCC)    skin ca-    GERD (gastroesophageal reflux disease)    Headache    History of hiatal hernia    Hypertension    Prostate cancer (HCC)    S/P TAVR (transcatheter aortic valve replacement)    SDH (subdural hematoma) (HCC)    Severe aortic stenosis    Thoracic ascending aortic aneurysm (HCC)    Vertebral artery aneurysm [facility])    Past Surgical History:  Procedure Laterality Date   BRAIN SURGERY  2016   evacuation of SDH   CARDIAC CATHETERIZATION     CARPAL TUNNEL RELEASE Bilateral    embolization of left vertebral artery aneurysm with pipeline/coil  10/31/2022   GREEN LIGHT LASER TURP (TRANSURETHRAL RESECTION OF PROSTATE  04/27/2017   KNEE ARTHROPLASTY     LEFT HEART CATH AND CORONARY ANGIOGRAPHY N/A 10/08/2019   Procedure: LEFT HEART CATH AND CORONARY ANGIOGRAPHY;  Surgeon: Wonda Sharper, MD;  Location: [facility] INVASIVE CV LAB;  Service: Cardiovascular;  Laterality: N/A;   QUADRICEPS TENDON REPAIR Bilateral 06/15/2017   Procedure: REPAIR QUADRICEP TENDON;  Surgeon:  Addie Cordella Glendia, MD;  Location: [facility] OR;  Service: Orthopedics;  Laterality: Bilateral;   SHOULDER SURGERY Right    TEE WITHOUT CARDIOVERSION N/A 07/03/2022   Procedure: TRANSESOPHAGEAL ECHOCARDIOGRAM (TEE);  Surgeon: Delford Maude BROCKS, MD;  Location: [facility] ENDOSCOPY;  Service: Cardiovascular;  Laterality: N/A;   TRANSCATHETER AORTIC VALVE REPLACEMENT, TRANSFEMORAL  12/23/2019   TRANSCATHETER AORTIC VALVE REPLACEMENT, TRANSFEMORAL N/A 12/23/2019   Procedure: TRANSCATHETER AORTIC VALVE REPLACEMENT, TRANSFEMORAL;  Surgeon: Wonda Sharper, MD;  Location: [facility] OR;  Service: Open Heart Surgery;  Laterality: N/A;   VASCULAR SURGERY     Patient Active Problem List   Diagnosis Date Noted   Hypertension    Severe aortic stenosis 10/08/2019   Candidiasis    Abnormality of gait    Hypoalbuminemia due to protein-calorie malnutrition [facility])    History of gout    Sleep disturbance    Constipation    Rupture of quadriceps tendon, right, sequela 06/19/2017   Quadriceps tendon rupture, left, sequela 06/19/2017   Acute blood loss anemia 06/19/2017   Fall    History of subdural hematoma    Post-operative pain    Recurrent falls    Quadriceps tendon rupture 06/15/2017   Primary osteoarthritis of both knees 02/28/2017   Primary osteoarthritis of both hands 02/28/2017   Uricacidemia 02/28/2017  Pain in joint of left knee 02/07/2017   Idiopathic chronic gout, unspecified site, without tophus (tophi) 11/29/2016   HEMATURIA UNSPECIFIED 01/25/2009   Abdominal pain 01/25/2009   XERODERMA 03/10/2008   UNS ADVRS EFF UNS RX MEDICINAL&BIOLOGICAL SBSTNC 03/10/2008   ADJUSTMENT REACTION WITH PHYSICAL SYMPTOMS 02/14/2008   MUSCLE SPASM 02/14/2008   ACUTE PROSTATITIS 12/16/2007   BREAST LUMP OR MASS, RIGHT 07/12/2007   H/O: RCT (rotator cuff tear) 01/13/2007   GOUT 01/08/2007    ONSET DATE: ~6 weeks onset pain  REFERRING DIAG:  M25.531 (ICD-10-CM) - Pain in right wrist  M25.521 (ICD-10-CM) - Pain in right  elbow  M65.30 (ICD-10-CM) - Trigger finger, unspecified finger, unspecified laterality    THERAPY DIAG:  Pain in right elbow  Stiffness of right wrist, not elsewhere classified  Muscle weakness (generalized)  Rationale for Evaluation and Treatment: Rehabilitation  PERTINENT HISTORY: having problems with: Rt wrist, Rt elbow and trigger finger  currently seeing PT for shoulder pain and problems on the right side he has pain near the base of the right thumb for about 6 weeks now.  He also has triggering in his fingers 3 and 4 of bilateral hands chronically.  He has received injections to the base of the thumb hand and the wrist to help with his pain.  He is wearing a prefabricated wrist and thumb spica brace today.  He does workout frequently, trying to stay active and he is going to the gym after this, he states   PRECAUTIONS: None  RED FLAGS: None   WEIGHT BEARING RESTRICTIONS: Yes recommended to limit weightbearing through the right wrist and hand to 5 pounds or less for the next few weeks if possible.    SUBJECTIVE:   SUBJECTIVE STATEMENT: He states  no pain now, 30% out of brace in past week     PAIN:  Are you having pain? Yes: NPRS scale: 0/10 at rest now Pain location: Rt thumb CMC J and MCPJs in  Pain description: Sharp and shooting Aggravating factors: Wrist and thumb motion Relieving factors: Unsure  FALLS: Has patient fallen in last 6 months? No  PLOF: Independent  PATIENT GOALS: To improve pain in the right wrist as well as limit triggering and pain through the fingers and increase functional ability  NEXT MD VISIT: As needed   OBJECTIVE: (All objective assessments below are from initial evaluation on: 05/01/24 unless otherwise specified.)    HAND DOMINANCE: Right   ADLs: Overall ADLs: States decreased ability to grab, hold household objects, pain and difficulty to open containers, perform FMS tasks (manipulate fasteners on clothing)   FUNCTIONAL  OUTCOME MEASURES: 06/10/24: Quick DASH: 22.7%  impairment today    Eval: Quick DASH 34% impairment today  (Higher % Score  =  More Impairment)     UPPER EXTREMITY ROM     Shoulder to Wrist AROM Right eval Left eval Rt 05/07/24 Rt 05/27/24 Rt 06/10/24 Rt 06/19/24  Forearm supination 67 pain   77 69   Forearm pronation  85   86 85   Wrist flexion 42 72 32 58 50   Wrist extension 60 69 50 56 56   Wrist ulnar deviation        Wrist radial deviation        (Blank rows = not tested)   Hand AROM Right eval Left eval  Full Fist Ability (or Gap to Distal Palmar Crease) Full fist with notable triggering of digits 4 and 3 Full fist with notable triggering  of digits 4 and 3  Thumb Opposition  (Kapandji Scale)  9/10 9/10  (Blank rows = not tested)   UPPER EXTREMITY MMT:      MMT Right 05/01/2024  Forearm supination 4 -/5 painful  Forearm pronation 4/5  Wrist flexion 4 -/5 painful  Wrist extension 5/5  Wrist ulnar deviation 5/5  Wrist radial deviation 4 -/5 painful  (Blank rows = not tested)  HAND FUNCTION: 06/10/24: TBD- withheld due to exacerbation of pain in past week   Eval: Observed weakness in affected bilateral hands due to triggering and pain.  Strong grip not advised, but this can be tested in the future if he feels better.   COORDINATION: 06/19/24: 9 Hole Peg Test Right: TBD sec (TBD sec is WFL)   Eval: Observed coordination impairments with affected right wrist and arm due to painful tenosynovitis of the thumb tendons.  Details will be tested as needed in upcoming sessions    OBSERVATIONS:   Eval: He has a positive Finkelstein's test on the right wrist and negative on the left wrist.  He is not tender to the [facility] joint of the right thumb.  Supination is tight and painful near the base of the thumb as well.  Today he presents as deQuervain's Tenosynovitis of the right thumb extensor tendons and triggering (or stenosing tenosynovitis) of the bilateral middle fingers and  ring fingers.    TODAY'S TREATMENT:  06/18/23: While he is on moist heat for 4 minutes, OT reeducates for continuing to be cautious and avoid pain especially in deviation motions and thumb motions.  After that, OT performs manual therapy IASTM to the first dorsal compartment, around the thumb and the forearm.  This is to help loosen fascial layers and prevent impinging on the extensor retinaculum, as well as loosening the muscles of the thumb and forearm are involved in this injury.  OT then reviews the stretch program with him, performing stretches at the wrist with him as well as stretches at the thumb as previously explained.  Lastly, OT does manual therapy kinesiotaping over the first dorsal compartment to help alleviate tendon irritation with the extensor retinaculum as well as provide support with thumb extension and wrist radial deviation thereby relaxing those muscles somewhat.  He states that it feels mildly supportive, but was told to take this off immediately if it causes any skin rash or irritation.  He should take it off after 2 or 3 days no matter what. (He will be seen on Thursday)  OT emphasizes consistency with the patient, still being conservative with wearing the orthosis, avoiding pain.  If he still having little to no pain in 2 days, we may trial light eccentric or isometric strengthening.    PATIENT EDUCATION: Education details: See tx section above for details  Person educated: Patient Education method: Verbal Instruction, Teach back, Handouts  Education comprehension: States and demonstrates understanding, Additional Education required    HOME EXERCISE PROGRAM: Access Code: [email or web address] URL: [email or web address] Date: 05/01/2024 Prepared by: Melvenia Ada   GOALS: Goals reviewed with patient? Yes   SHORT TERM GOALS: (STG required if POC>30 days) Target Date: 05/09/2024  Pt will obtain protective, custom orthotic. Goal status: 05/05/2024: Met  2.  Pt  will demo/state understanding of initial HEP to improve pain levels and prerequisite motion. Goal status: 06/10/24: MET   LONG TERM GOALS: Target Date: 07/25/24  Pt will improve functional ability by decreased impairment per Quick DASH assessment from 34% to 15% or better, for better quality  of life. Goal status: 06/10/24: improved to 22.7% now   2.  Pt will improve grip strength in right hand from weak and painful to at least nonpainful 30 lbs for functional use at home and in IADLs. Goal status: 06/10/24: NT due to exacerbation   3.  Pt will improve A/ROM in right wrist flexion from painful 42 degrees to at least nonpainful 60 degrees, to have functional motion for tasks like reach and grasp.  Goal status:06/10/24: improved to 50* now   4.  Pt will improve strength in right wrist radial deviation from painful 4 -/5 MMT to at least 4+/5 MMT to have increased functional ability to carry out selfcare and higher-level homecare tasks with less difficulty. Goal status: 06/10/24: NT due to exacerbation   5. Pt will decrease pain at worst from 10/10 to 4/10 or better to have better sleep and occupational participation in daily roles. Goal status:06/10/24: improving, but still having exacerbations    ASSESSMENT:  CLINICAL IMPRESSION: 06/17/24: It is great that he is back down to having no significant pain at rest.  It is also good if he is tolerating better stretches, he is less tender to palpation.  We may be able to start strengthening this Thursday, but it will need to be very cautious and done after a warm up and stretches, otherwise we will wait until next week  06/10/24: Unfortunately Nicolaos has had an exacerbation in the past 2 weeks consecutively in his not been able to move forward with her plan of care.  Therapy has worked to lower his pain in the past, however, and so he wishes to continue therapy and he will try to be more thoughtful about not taking ditches or causing exacerbations.  Etc. OT  agrees with this plan and agrees to try 6 more weeks of therapy up to 2 times a week to ensure he is not having these exacerbations of pain and to hopefully move into strengthening sooner than later to help prevent exacerbations in the future.   PLAN:  OT FREQUENCY: 1-2x/week  OT DURATION: 6 additional weeks from 06/13/2024 - 07/25/24 and up to 20 total visits as needed  PLANNED INTERVENTIONS: 97535 self care/ADL training, 02889 therapeutic exercise, 97530 therapeutic activity, 97112 neuromuscular re-education, 97140 manual therapy, 97035 ultrasound, 97760 Orthotic Initial, 97763 Orthotic/Prosthetic subsequent, Dry needling, coping strategies training, patient/family education, and DME and/or AE instructions  RECOMMENDED OTHER SERVICES: In PT for shoulder pain and problems currently  CONSULTED AND AGREED WITH PLAN OF CARE: Patient  PLAN FOR NEXT SESSION:   If still not painful, use heat in an active warm up and after stretches tried gentle isometric strengthening of the wrist, avoiding thumb extension for now.  Consider this being in therapy only and not assigning for homework yet, as he has a tendency to overdo things   Melvenia Ada, OTR/L, CHT 06/17/2024, 4:55 PM

## 2024-06-17 ENCOUNTER — Encounter: Payer: Self-pay | Admitting: Rehabilitative and Restorative Service Providers"

## 2024-06-17 ENCOUNTER — Other Ambulatory Visit: Payer: Self-pay

## 2024-06-17 ENCOUNTER — Ambulatory Visit (INDEPENDENT_AMBULATORY_CARE_PROVIDER_SITE_OTHER): Admitting: Rehabilitative and Restorative Service Providers"

## 2024-06-17 ENCOUNTER — Ambulatory Visit: Admitting: Orthopedic Surgery

## 2024-06-17 DIAGNOSIS — M25631 Stiffness of right wrist, not elsewhere classified: Secondary | ICD-10-CM

## 2024-06-17 DIAGNOSIS — M6281 Muscle weakness (generalized): Secondary | ICD-10-CM

## 2024-06-17 DIAGNOSIS — M25521 Pain in right elbow: Secondary | ICD-10-CM

## 2024-06-17 MED ORDER — LOSARTAN POTASSIUM 25 MG PO TABS: 25.0000 mg | ORAL_TABLET | Freq: Every day | ORAL | 0 refills | Status: DC

## 2024-06-17 MED ORDER — METOPROLOL SUCCINATE ER 50 MG PO TB24: 50.0000 mg | ORAL_TABLET | Freq: Every day | ORAL | 0 refills | Status: DC

## 2024-06-17 NOTE — Addendum Note (Signed)
 Addended by: BLUFORD RAMP D on: 06/17/2024 10:36 AM   Modules accepted: Orders

## 2024-06-18 NOTE — Therapy (Incomplete)
 OUTPATIENT OCCUPATIONAL THERAPY TREATMENT  NOTE  Patient Name: Jose [name] MRN: [id number] DOB:1951-03-10, 73 y.o., male Today's Date: 06/18/2024  PCP: Birda Ahumada, MD REFERRING PROVIDER: [name] Reiter, MD     END OF SESSION:     Past Medical History:  Diagnosis Date   Arthritis    R shoulder, great toes, hands- has gout & has injections in knees with Dr. [name]    Bicuspid aortic valve with ascending aorta 4.0 to 4.5 cm in diameter    CAD (coronary artery disease)    Cancer (HCC)    skin ca-    GERD (gastroesophageal reflux disease)    Headache    History of hiatal hernia    Hypertension    Prostate cancer (HCC)    S/P TAVR (transcatheter aortic valve replacement)    SDH (subdural hematoma) (HCC)    Severe aortic stenosis    Thoracic ascending aortic aneurysm (HCC)    Vertebral artery aneurysm [facility])    Past Surgical History:  Procedure Laterality Date   BRAIN SURGERY  2016   evacuation of SDH   CARDIAC CATHETERIZATION     CARPAL TUNNEL RELEASE Bilateral    embolization of left vertebral artery aneurysm with pipeline/coil  10/31/2022   GREEN LIGHT LASER TURP (TRANSURETHRAL RESECTION OF PROSTATE  04/27/2017   KNEE ARTHROPLASTY     LEFT HEART CATH AND CORONARY ANGIOGRAPHY N/A 10/08/2019   Procedure: LEFT HEART CATH AND CORONARY ANGIOGRAPHY;  Surgeon: Wonda Sharper, MD;  Location: [facility] INVASIVE CV LAB;  Service: Cardiovascular;  Laterality: N/A;   QUADRICEPS TENDON REPAIR Bilateral 06/15/2017   Procedure: REPAIR QUADRICEP TENDON;  Surgeon: Addie Cordella Glendia, MD;  Location: [facility] OR;  Service: Orthopedics;  Laterality: Bilateral;   SHOULDER SURGERY Right    TEE WITHOUT CARDIOVERSION N/A 07/03/2022   Procedure: TRANSESOPHAGEAL ECHOCARDIOGRAM (TEE);  Surgeon: Delford Maude BROCKS, MD;  Location: [facility] ENDOSCOPY;  Service: Cardiovascular;  Laterality: N/A;   TRANSCATHETER AORTIC VALVE REPLACEMENT, TRANSFEMORAL  12/23/2019   TRANSCATHETER AORTIC VALVE REPLACEMENT,  TRANSFEMORAL N/A 12/23/2019   Procedure: TRANSCATHETER AORTIC VALVE REPLACEMENT, TRANSFEMORAL;  Surgeon: Wonda Sharper, MD;  Location: [facility] OR;  Service: Open Heart Surgery;  Laterality: N/A;   VASCULAR SURGERY     Patient Active Problem List   Diagnosis Date Noted   Hypertension    Severe aortic stenosis 10/08/2019   Candidiasis    Abnormality of gait    Hypoalbuminemia due to protein-calorie malnutrition [facility])    History of gout    Sleep disturbance    Constipation    Rupture of quadriceps tendon, right, sequela 06/19/2017   Quadriceps tendon rupture, left, sequela 06/19/2017   Acute blood loss anemia 06/19/2017   Fall    History of subdural hematoma    Post-operative pain    Recurrent falls    Quadriceps tendon rupture 06/15/2017   Primary osteoarthritis of both knees 02/28/2017   Primary osteoarthritis of both hands 02/28/2017   Uricacidemia 02/28/2017   Pain in joint of left knee 02/07/2017   Idiopathic chronic gout, unspecified site, without tophus (tophi) 11/29/2016   HEMATURIA UNSPECIFIED 01/25/2009   Abdominal pain 01/25/2009   XERODERMA 03/10/2008   UNS ADVRS EFF UNS RX MEDICINAL&BIOLOGICAL SBSTNC 03/10/2008   ADJUSTMENT REACTION WITH PHYSICAL SYMPTOMS 02/14/2008   MUSCLE SPASM 02/14/2008   ACUTE PROSTATITIS 12/16/2007   BREAST LUMP OR MASS, RIGHT 07/12/2007   H/O: RCT (rotator cuff tear) 01/13/2007   GOUT 01/08/2007    ONSET DATE: ~6 weeks onset pain  REFERRING DIAG:  M25.531 (ICD-10-CM) - Pain in right wrist  M25.521 (ICD-10-CM) - Pain in right elbow  M65.30 (ICD-10-CM) - Trigger finger, unspecified finger, unspecified laterality    THERAPY DIAG:  No diagnosis found.  Rationale for Evaluation and Treatment: Rehabilitation  PERTINENT HISTORY: having problems with: Rt wrist, Rt elbow and trigger finger  currently seeing PT for shoulder pain and problems on the right side he has pain near the base of the right thumb for about 6 weeks now.  He also has  triggering in his fingers 3 and 4 of bilateral hands chronically.  He has received injections to the base of the thumb hand and the wrist to help with his pain.  He is wearing a prefabricated wrist and thumb spica brace today.  He does workout frequently, trying to stay active and he is going to the gym after this, he states   PRECAUTIONS: None  RED FLAGS: None   WEIGHT BEARING RESTRICTIONS: Yes recommended to limit weightbearing through the right wrist and hand to 5 pounds or less for the next few weeks if possible.    SUBJECTIVE:   SUBJECTIVE STATEMENT: He states ***   no pain now, 30% out of brace in past week     PAIN:  Are you having pain? Yes: NPRS scale: *** 0/10 at rest now Pain location: Rt thumb CMC J and MCPJs in  Pain description: Sharp and shooting Aggravating factors: Wrist and thumb motion Relieving factors: Unsure  FALLS: Has patient fallen in last 6 months? No  PLOF: Independent  PATIENT GOALS: To improve pain in the right wrist as well as limit triggering and pain through the fingers and increase functional ability  NEXT MD VISIT: As needed   OBJECTIVE: (All objective assessments below are from initial evaluation on: 05/01/24 unless otherwise specified.)    HAND DOMINANCE: Right   ADLs: Overall ADLs: States decreased ability to grab, hold household objects, pain and difficulty to open containers, perform FMS tasks (manipulate fasteners on clothing)   FUNCTIONAL OUTCOME MEASURES: 06/10/24: Quick DASH: 22.7%  impairment today    Eval: Quick DASH 34% impairment today  (Higher % Score  =  More Impairment)     UPPER EXTREMITY ROM     Shoulder to Wrist AROM Right eval Left eval Rt 05/07/24 Rt 05/27/24 Rt 06/10/24 Rt 06/19/24  Forearm supination 67 pain   77 69   Forearm pronation  85   86 85   Wrist flexion 42 72 32 58 50 ***  Wrist extension 60 69 50 56 56 ***  Wrist ulnar deviation        Wrist radial deviation        (Blank rows = not  tested)   Hand AROM Right eval Left eval  Full Fist Ability (or Gap to Distal Palmar Crease) Full fist with notable triggering of digits 4 and 3 Full fist with notable triggering of digits 4 and 3  Thumb Opposition  (Kapandji Scale)  9/10 9/10  (Blank rows = not tested)   UPPER EXTREMITY MMT:      MMT Right 05/01/2024  Forearm supination 4 -/5 painful  Forearm pronation 4/5  Wrist flexion 4 -/5 painful  Wrist extension 5/5  Wrist ulnar deviation 5/5  Wrist radial deviation 4 -/5 painful  (Blank rows = not tested)  HAND FUNCTION: 06/19/24: Rt grip : ***#    Eval: Observed weakness in affected bilateral hands due to triggering and pain.  Strong grip not advised, but this can be  tested in the future if he feels better.   COORDINATION: 06/19/24: 9 Hole Peg Test Right: TBD sec (TBD sec is WFL)   Eval: Observed coordination impairments with affected right wrist and arm due to painful tenosynovitis of the thumb tendons.  Details will be tested as needed in upcoming sessions    OBSERVATIONS:   Eval: He has a positive Finkelstein's test on the right wrist and negative on the left wrist.  He is not tender to the [employer] joint of the right thumb.  Supination is tight and painful near the base of the thumb as well.  Today he presents as deQuervain's Tenosynovitis of the right thumb extensor tendons and triggering (or stenosing tenosynovitis) of the bilateral middle fingers and ring fingers.    TODAY'S TREATMENT:  06/19/24: *** If still not painful, use heat in an active warm up and after stretches tried gentle isometric strengthening of the wrist, avoiding thumb extension for now.  Consider this being in therapy only and not assigning for homework yet, as he has a tendency to overdo things    06/18/23: While he is on moist heat for 4 minutes, OT reeducates for continuing to be cautious and avoid pain especially in deviation motions and thumb motions.  After that, OT performs manual therapy  IASTM to the first dorsal compartment, around the thumb and the forearm.  This is to help loosen fascial layers and prevent impinging on the extensor retinaculum, as well as loosening the muscles of the thumb and forearm are involved in this injury.  OT then reviews the stretch program with him, performing stretches at the wrist with him as well as stretches at the thumb as previously explained.  Lastly, OT does manual therapy kinesiotaping over the first dorsal compartment to help alleviate tendon irritation with the extensor retinaculum as well as provide support with thumb extension and wrist radial deviation thereby relaxing those muscles somewhat.  He states that it feels mildly supportive, but was told to take this off immediately if it causes any skin rash or irritation.  He should take it off after 2 or 3 days no matter what. (He will be seen on Thursday)  OT emphasizes consistency with the patient, still being conservative with wearing the orthosis, avoiding pain.  If he still having little to no pain in 2 days, we may trial light eccentric or isometric strengthening.    PATIENT EDUCATION: Education details: See tx section above for details  Person educated: Patient Education method: Verbal Instruction, Teach back, Handouts  Education comprehension: States and demonstrates understanding, Additional Education required    HOME EXERCISE PROGRAM: Access Code: [email or web address] URL: [email or web address] Date: 05/01/2024 Prepared by: Melvenia Ada   GOALS: Goals reviewed with patient? Yes   SHORT TERM GOALS: (STG required if POC>30 days) Target Date: 05/09/2024  Pt will obtain protective, custom orthotic. Goal status: 05/05/2024: Met  2.  Pt will demo/state understanding of initial HEP to improve pain levels and prerequisite motion. Goal status: 06/10/24: MET   LONG TERM GOALS: Target Date: 07/25/24  Pt will improve functional ability by decreased impairment per Quick DASH  assessment from 34% to 15% or better, for better quality of life. Goal status: 06/10/24: improved to 22.7% now   2.  Pt will improve grip strength in right hand from weak and painful to at least nonpainful 30 lbs for functional use at home and in IADLs. Goal status: 06/10/24: NT due to exacerbation   3.  Pt will improve A/ROM in right  wrist flexion from painful 42 degrees to at least nonpainful 60 degrees, to have functional motion for tasks like reach and grasp.  Goal status:06/10/24: improved to 50* now   4.  Pt will improve strength in right wrist radial deviation from painful 4 -/5 MMT to at least 4+/5 MMT to have increased functional ability to carry out selfcare and higher-level homecare tasks with less difficulty. Goal status: 06/10/24: NT due to exacerbation   5. Pt will decrease pain at worst from 10/10 to 4/10 or better to have better sleep and occupational participation in daily roles. Goal status:06/10/24: improving, but still having exacerbations    ASSESSMENT:  CLINICAL IMPRESSION: 06/19/24:***    06/17/24: It is great that he is back down to having no significant pain at rest.  It is also good if he is tolerating better stretches, he is less tender to palpation.  We may be able to start strengthening this Thursday, but it will need to be very cautious and done after a warm up and stretches, otherwise we will wait until next week  06/10/24: Unfortunately Joaquim has had an exacerbation in the past 2 weeks consecutively in his not been able to move forward with her plan of care.  Therapy has worked to lower his pain in the past, however, and so he wishes to continue therapy and he will try to be more thoughtful about not taking ditches or causing exacerbations.  Etc. OT agrees with this plan and agrees to try 6 more weeks of therapy up to 2 times a week to ensure he is not having these exacerbations of pain and to hopefully move into strengthening sooner than later to help prevent  exacerbations in the future.   PLAN:  OT FREQUENCY: 1-2x/week  OT DURATION: 6 additional weeks from 06/13/2024 - 07/25/24 and up to 20 total visits as needed  PLANNED INTERVENTIONS: 97535 self care/ADL training, 02889 therapeutic exercise, 97530 therapeutic activity, 97112 neuromuscular re-education, 97140 manual therapy, 97035 ultrasound, 97760 Orthotic Initial, 97763 Orthotic/Prosthetic subsequent, Dry needling, coping strategies training, patient/family education, and DME and/or AE instructions  RECOMMENDED OTHER SERVICES: In PT for shoulder pain and problems currently  CONSULTED AND AGREED WITH PLAN OF CARE: Patient  PLAN FOR NEXT SESSION:   ***  Melvenia Ada, OTR/L, CHT 06/18/2024, 10:05 AM

## 2024-06-19 ENCOUNTER — Encounter: Admitting: Rehabilitative and Restorative Service Providers"

## 2024-06-23 NOTE — Therapy (Signed)
 OUTPATIENT OCCUPATIONAL THERAPY TREATMENT  NOTE  Patient Name: Jose [name] MRN: [id number] DOB:1951/02/18, 73 y.o., male Today's Date: 06/24/2024  PCP: Birda Ahumada, MD REFERRING PROVIDER: [name] Reiter, MD     END OF SESSION:  OT End of Session - 06/24/24 1250     Visit Number 9    Number of Visits 20    Date for OT Re-Evaluation 07/25/24    Authorization Type Medicare    OT Start Time 1300    OT Stop Time 1344    OT Time Calculation (min) 44 min    Activity Tolerance Patient tolerated treatment well;No increased pain;Patient limited by pain;Patient limited by fatigue    Behavior During Therapy [facility] for tasks assessed/performed            Past Medical History:  Diagnosis Date   Arthritis    R shoulder, great toes, hands- has gout & has injections in knees with Dr. [name]    Bicuspid aortic valve with ascending aorta 4.0 to 4.5 cm in diameter    CAD (coronary artery disease)    Cancer (HCC)    skin ca-    GERD (gastroesophageal reflux disease)    Headache    History of hiatal hernia    Hypertension    Prostate cancer (HCC)    S/P TAVR (transcatheter aortic valve replacement)    SDH (subdural hematoma) (HCC)    Severe aortic stenosis    Thoracic ascending aortic aneurysm (HCC)    Vertebral artery aneurysm [facility])    Past Surgical History:  Procedure Laterality Date   BRAIN SURGERY  2016   evacuation of SDH   CARDIAC CATHETERIZATION     CARPAL TUNNEL RELEASE Bilateral    embolization of left vertebral artery aneurysm with pipeline/coil  10/31/2022   GREEN LIGHT LASER TURP (TRANSURETHRAL RESECTION OF PROSTATE  04/27/2017   KNEE ARTHROPLASTY     LEFT HEART CATH AND CORONARY ANGIOGRAPHY N/A 10/08/2019   Procedure: LEFT HEART CATH AND CORONARY ANGIOGRAPHY;  Surgeon: Wonda Sharper, MD;  Location: [facility] INVASIVE CV LAB;  Service: Cardiovascular;  Laterality: N/A;   QUADRICEPS TENDON REPAIR Bilateral 06/15/2017   Procedure: REPAIR QUADRICEP TENDON;   Surgeon: Addie Cordella Glendia, MD;  Location: [facility] OR;  Service: Orthopedics;  Laterality: Bilateral;   SHOULDER SURGERY Right    TEE WITHOUT CARDIOVERSION N/A 07/03/2022   Procedure: TRANSESOPHAGEAL ECHOCARDIOGRAM (TEE);  Surgeon: Delford Maude BROCKS, MD;  Location: [facility] ENDOSCOPY;  Service: Cardiovascular;  Laterality: N/A;   TRANSCATHETER AORTIC VALVE REPLACEMENT, TRANSFEMORAL  12/23/2019   TRANSCATHETER AORTIC VALVE REPLACEMENT, TRANSFEMORAL N/A 12/23/2019   Procedure: TRANSCATHETER AORTIC VALVE REPLACEMENT, TRANSFEMORAL;  Surgeon: Wonda Sharper, MD;  Location: [facility] OR;  Service: Open Heart Surgery;  Laterality: N/A;   VASCULAR SURGERY     Patient Active Problem List   Diagnosis Date Noted   Hypertension    Severe aortic stenosis 10/08/2019   Candidiasis    Abnormality of gait    Hypoalbuminemia due to protein-calorie malnutrition [facility])    History of gout    Sleep disturbance    Constipation    Rupture of quadriceps tendon, right, sequela 06/19/2017   Quadriceps tendon rupture, left, sequela 06/19/2017   Acute blood loss anemia 06/19/2017   Fall    History of subdural hematoma    Post-operative pain    Recurrent falls    Quadriceps tendon rupture 06/15/2017   Primary osteoarthritis of both knees 02/28/2017   Primary osteoarthritis of both hands 02/28/2017   Uricacidemia 02/28/2017  Pain in joint of left knee 02/07/2017   Idiopathic chronic gout, unspecified site, without tophus (tophi) 11/29/2016   HEMATURIA UNSPECIFIED 01/25/2009   Abdominal pain 01/25/2009   XERODERMA 03/10/2008   UNS ADVRS EFF UNS RX MEDICINAL&BIOLOGICAL SBSTNC 03/10/2008   ADJUSTMENT REACTION WITH PHYSICAL SYMPTOMS 02/14/2008   MUSCLE SPASM 02/14/2008   ACUTE PROSTATITIS 12/16/2007   BREAST LUMP OR MASS, RIGHT 07/12/2007   H/O: RCT (rotator cuff tear) 01/13/2007   GOUT 01/08/2007    ONSET DATE: ~6 weeks onset pain  REFERRING DIAG:  M25.531 (ICD-10-CM) - Pain in right wrist  M25.521 (ICD-10-CM) - Pain  in right elbow  M65.30 (ICD-10-CM) - Trigger finger, unspecified finger, unspecified laterality    THERAPY DIAG:  Pain in right elbow  Stiffness of right wrist, not elsewhere classified  Muscle weakness (generalized)  Rationale for Evaluation and Treatment: Rehabilitation  PERTINENT HISTORY: having problems with: Rt wrist, Rt elbow and trigger finger  currently seeing PT for shoulder pain and problems on the right side he has pain near the base of the right thumb for about 6 weeks now.  He also has triggering in his fingers 3 and 4 of bilateral hands chronically.  He has received injections to the base of the thumb hand and the wrist to help with his pain.  He is wearing a prefabricated wrist and thumb spica brace today.  He does workout frequently, trying to stay active and he is going to the gym after this, he states   PRECAUTIONS: None  RED FLAGS: None   WEIGHT BEARING RESTRICTIONS: Yes recommended to limit weightbearing through the right wrist and hand to 5 pounds or less for the next few weeks if possible.    SUBJECTIVE:   SUBJECTIVE STATEMENT: He states being 30% out of brace now, he is not wearing it today at entrance.  He also states that he went to the gym earlier today and has done some working out on his arm, but he states it was not painful to him.    PAIN:  Are you having pain? Yes: NPRS scale:  0/10 at rest now Pain location: Rt thumb CMC J and MCPJs in  Pain description: Sharp and shooting Aggravating factors: Wrist and thumb motion Relieving factors: Unsure  FALLS: Has patient fallen in last 6 months? No  PLOF: Independent  PATIENT GOALS: To improve pain in the right wrist as well as limit triggering and pain through the fingers and increase functional ability  NEXT MD VISIT: As needed   OBJECTIVE: (All objective assessments below are from initial evaluation on: 05/01/24 unless otherwise specified.)    HAND DOMINANCE: Right   ADLs: Overall ADLs:  States decreased ability to grab, hold household objects, pain and difficulty to open containers, perform FMS tasks (manipulate fasteners on clothing)   FUNCTIONAL OUTCOME MEASURES: 06/10/24: Quick DASH: 22.7%  impairment today    Eval: Quick DASH 34% impairment today  (Higher % Score  =  More Impairment)     UPPER EXTREMITY ROM     Shoulder to Wrist AROM Right eval Left eval Rt 05/07/24 Rt 05/27/24 Rt 06/10/24 Rt 06/24/24  Forearm supination 67 pain   77 69   Forearm pronation  85   86 85   Wrist flexion 42 72 32 58 50 15  Wrist extension 60 69 50 56 56 49  Wrist ulnar deviation        Wrist radial deviation        (Blank rows = not tested)  Hand AROM Right eval Left eval  Full Fist Ability (or Gap to Distal Palmar Crease) Full fist with notable triggering of digits 4 and 3 Full fist with notable triggering of digits 4 and 3  Thumb Opposition  (Kapandji Scale)  9/10 9/10  (Blank rows = not tested)   UPPER EXTREMITY MMT:      MMT Right 05/01/2024  Forearm supination 4 -/5 painful  Forearm pronation 4/5  Wrist flexion 4 -/5 painful  Wrist extension 5/5  Wrist ulnar deviation 5/5  Wrist radial deviation 4 -/5 painful  (Blank rows = not tested)  HAND FUNCTION: 06/24/24: Rt grip : 53.3# no pain    Eval: Observed weakness in affected bilateral hands due to triggering and pain.  Strong grip not advised, but this can be tested in the future if he feels better.   COORDINATION: 06/19/24: 9 Hole Peg Test Right: TBD sec (TBD sec is WFL)   Eval: Observed coordination impairments with affected right wrist and arm due to painful tenosynovitis of the thumb tendons.  Details will be tested as needed in upcoming sessions    OBSERVATIONS:   Eval: He has a positive Finkelstein's test on the right wrist and negative on the left wrist.  He is not tender to the [facility] joint of the right thumb.  Supination is tight and painful near the base of the thumb as well.  Today he presents as  deQuervain's Tenosynovitis of the right thumb extensor tendons and triggering (or stenosing tenosynovitis) of the bilateral middle fingers and ring fingers.    TODAY'S TREATMENT:  06/24/24: He starts with active range of motion for exercise as well as new measures which unfortunately shows the wrist somewhat painful and tight today.  We discussed this and OT encourages him to wear the orthosis more, watch for cumulative stress to the wrist and hand, as we have been at this juncture in the past several weeks ago and unfortunately he relapsed into pain from doing too vigorous activity.  OT spends a good time discussing his self-care/safety in this regard.  OT also does manual therapy IASTM to help mobilize tissues as well as applying Kinesiotape again today as he felt this was helpful.  His other home exercises were also reviewed today.  We discussed weaning from the orthosis from 30% to 40 to 50% now, doing light isometric strengthening of the wrist in flexion and extension and light isometric grip training.  Anything this painful or too repetitive he should totally avoid still.  He should always be warmed up and stretched out before exercising.  He states understanding all directions and leaves in no significant pain today.      Exercises - Forearm Supination Stretch  - 3-4 x daily - 3-5 reps - 15 sec hold - Forearm Pronation Stretch  - 3-4 x daily - 3-5 reps - 15 sec hold - Wrist Flexion Stretch  - 4 x daily - 3-5 reps - 15 sec hold - Seated Wrist Extension Stretch  - 3-6 x daily - 3-5 reps - 15 hold - Stretch Thumb DOWNWARD  - 2-3 x daily - 3 reps - 15 sec hold - HOOK Stretch  - 4 x daily - 3-5 reps - 15-20 sec hold - Full Fist  - 2-3 x daily - 5 reps - 10 sec hold - Seated Isometric Wrist Flexion Supinated with Manual Resistance  - 4-6 x daily - 1 sets - 10-15 reps - Isometric Wrist Extension Pronated  - 2-3 x  daily - 4-5 x weekly - 5 reps - 5-10 seconds hold    PATIENT EDUCATION: Education  details: See tx section above for details  Person educated: Patient Education method: Verbal Instruction, Teach back, Handouts  Education comprehension: States and demonstrates understanding, Additional Education required    HOME EXERCISE PROGRAM: Access Code: [email or web address] URL: [email or web address] Date: 05/01/2024 Prepared by: Melvenia Ada   GOALS: Goals reviewed with patient? Yes   SHORT TERM GOALS: (STG required if POC>30 days) Target Date: 05/09/2024  Pt will obtain protective, custom orthotic. Goal status: 05/05/2024: Met  2.  Pt will demo/state understanding of initial HEP to improve pain levels and prerequisite motion. Goal status: 06/10/24: MET   LONG TERM GOALS: Target Date: 07/25/24  Pt will improve functional ability by decreased impairment per Quick DASH assessment from 34% to 15% or better, for better quality of life. Goal status: 06/10/24: improved to 22.7% now   2.  Pt will improve grip strength in right hand from weak and painful to at least nonpainful 30 lbs for functional use at home and in IADLs. Goal status: 06/10/24: NT due to exacerbation   3.  Pt will improve A/ROM in right wrist flexion from painful 42 degrees to at least nonpainful 60 degrees, to have functional motion for tasks like reach and grasp.  Goal status:06/10/24: improved to 50* now   4.  Pt will improve strength in right wrist radial deviation from painful 4 -/5 MMT to at least 4+/5 MMT to have increased functional ability to carry out selfcare and higher-level homecare tasks with less difficulty. Goal status: 06/10/24: NT due to exacerbation   5. Pt will decrease pain at worst from 10/10 to 4/10 or better to have better sleep and occupational participation in daily roles. Goal status:06/10/24: improving, but still having exacerbations    ASSESSMENT:  CLINICAL IMPRESSION: 06/24/24: As he had no pain for the past 2 weeks for the most part, he advance to light isometric  strengthening again.  Unfortunately he did wean from the orthosis and go to the gym today and perform some strengthening which did seem to flare him up somewhat.  He was strongly cautioned to be very cautious with this and slow down, wear the brace, only do therapy exercises if anything at the gym or other settings is painful or hurting him.   06/17/24: It is great that he is back down to having no significant pain at rest.  It is also good if he is tolerating better stretches, he is less tender to palpation.  We may be able to start strengthening this Thursday, but it will need to be very cautious and done after a warm up and stretches, otherwise we will wait until next week  06/10/24: Unfortunately Dalton has had an exacerbation in the past 2 weeks consecutively in his not been able to move forward with her plan of care.  Therapy has worked to lower his pain in the past, however, and so he wishes to continue therapy and he will try to be more thoughtful about not taking ditches or causing exacerbations.  Etc. OT agrees with this plan and agrees to try 6 more weeks of therapy up to 2 times a week to ensure he is not having these exacerbations of pain and to hopefully move into strengthening sooner than later to help prevent exacerbations in the future.   PLAN:  OT FREQUENCY: 1-2x/week  OT DURATION: 6 additional weeks from 06/13/2024 - 07/25/24 and up to 20 total visits  as needed  PLANNED INTERVENTIONS: 97535 self care/ADL training, 02889 therapeutic exercise, 97530 therapeutic activity, 97112 neuromuscular re-education, 97140 manual therapy, 97035 ultrasound, 97760 Orthotic Initial, 97763 Orthotic/Prosthetic subsequent, Dry needling, coping strategies training, patient/family education, and DME and/or AE instructions  RECOMMENDED OTHER SERVICES: In PT for shoulder pain and problems currently  CONSULTED AND AGREED WITH PLAN OF CARE: Patient  PLAN FOR NEXT SESSION:   Check him on Thursday for any  exacerbation of pain symptoms, adjust as needed.  Melvenia Ada, OTR/L, CHT 06/24/2024, 1:53 PM

## 2024-06-24 ENCOUNTER — Encounter: Payer: Self-pay | Admitting: Rehabilitative and Restorative Service Providers"

## 2024-06-24 ENCOUNTER — Ambulatory Visit (INDEPENDENT_AMBULATORY_CARE_PROVIDER_SITE_OTHER): Admitting: Rehabilitative and Restorative Service Providers"

## 2024-06-24 DIAGNOSIS — M6281 Muscle weakness (generalized): Secondary | ICD-10-CM

## 2024-06-24 DIAGNOSIS — M25521 Pain in right elbow: Secondary | ICD-10-CM

## 2024-06-24 DIAGNOSIS — M25631 Stiffness of right wrist, not elsewhere classified: Secondary | ICD-10-CM | POA: Diagnosis not present

## 2024-06-25 ENCOUNTER — Ambulatory Visit: Payer: Medicare Other | Attending: Physician Assistant | Admitting: Physician Assistant

## 2024-06-25 ENCOUNTER — Ambulatory Visit (INDEPENDENT_AMBULATORY_CARE_PROVIDER_SITE_OTHER): Admitting: Orthopedic Surgery

## 2024-06-25 ENCOUNTER — Encounter: Payer: Self-pay | Admitting: Physician Assistant

## 2024-06-25 VITALS — BP 94/67 | HR 65 | Resp 14 | Ht 72.0 in | Wt 199.0 lb

## 2024-06-25 DIAGNOSIS — G629 Polyneuropathy, unspecified: Secondary | ICD-10-CM | POA: Diagnosis present

## 2024-06-25 DIAGNOSIS — S76112S Strain of left quadriceps muscle, fascia and tendon, sequela: Secondary | ICD-10-CM | POA: Diagnosis present

## 2024-06-25 DIAGNOSIS — Z952 Presence of prosthetic heart valve: Secondary | ICD-10-CM | POA: Diagnosis present

## 2024-06-25 DIAGNOSIS — S76111S Strain of right quadriceps muscle, fascia and tendon, sequela: Secondary | ICD-10-CM | POA: Diagnosis present

## 2024-06-25 DIAGNOSIS — M25512 Pain in left shoulder: Secondary | ICD-10-CM | POA: Insufficient documentation

## 2024-06-25 DIAGNOSIS — M65341 Trigger finger, right ring finger: Secondary | ICD-10-CM | POA: Insufficient documentation

## 2024-06-25 DIAGNOSIS — M65351 Trigger finger, right little finger: Secondary | ICD-10-CM | POA: Insufficient documentation

## 2024-06-25 DIAGNOSIS — M1A09X0 Idiopathic chronic gout, multiple sites, without tophus (tophi): Secondary | ICD-10-CM | POA: Diagnosis not present

## 2024-06-25 DIAGNOSIS — A809 Acute poliomyelitis, unspecified: Secondary | ICD-10-CM | POA: Diagnosis present

## 2024-06-25 DIAGNOSIS — M19072 Primary osteoarthritis, left ankle and foot: Secondary | ICD-10-CM | POA: Insufficient documentation

## 2024-06-25 DIAGNOSIS — M65331 Trigger finger, right middle finger: Secondary | ICD-10-CM | POA: Insufficient documentation

## 2024-06-25 DIAGNOSIS — M19041 Primary osteoarthritis, right hand: Secondary | ICD-10-CM | POA: Insufficient documentation

## 2024-06-25 DIAGNOSIS — G8929 Other chronic pain: Secondary | ICD-10-CM | POA: Diagnosis present

## 2024-06-25 DIAGNOSIS — M25511 Pain in right shoulder: Secondary | ICD-10-CM | POA: Insufficient documentation

## 2024-06-25 DIAGNOSIS — M1811 Unilateral primary osteoarthritis of first carpometacarpal joint, right hand: Secondary | ICD-10-CM | POA: Diagnosis not present

## 2024-06-25 DIAGNOSIS — M19071 Primary osteoarthritis, right ankle and foot: Secondary | ICD-10-CM | POA: Insufficient documentation

## 2024-06-25 DIAGNOSIS — Z5181 Encounter for therapeutic drug level monitoring: Secondary | ICD-10-CM | POA: Diagnosis not present

## 2024-06-25 DIAGNOSIS — M19042 Primary osteoarthritis, left hand: Secondary | ICD-10-CM | POA: Diagnosis present

## 2024-06-25 DIAGNOSIS — S065XAA Traumatic subdural hemorrhage with loss of consciousness status unknown, initial encounter: Secondary | ICD-10-CM | POA: Diagnosis present

## 2024-06-25 DIAGNOSIS — M170 Bilateral primary osteoarthritis of knee: Secondary | ICD-10-CM | POA: Insufficient documentation

## 2024-06-25 LAB — CBC WITH DIFFERENTIAL/PLATELET
Absolute Lymphocytes: 1325 {cells}/uL (ref 850–3900)
Absolute Monocytes: 710 {cells}/uL (ref 200–950)
Basophils Absolute: 81 {cells}/uL (ref 0–200)
Basophils Relative: 1.1 %
Eosinophils Absolute: 178 {cells}/uL (ref 15–500)
Eosinophils Relative: 2.4 %
HCT: 42.9 % (ref 38.5–50.0)
Hemoglobin: 14.2 g/dL (ref 13.2–17.1)
MCH: 31.6 pg (ref 27.0–33.0)
MCHC: 33.1 g/dL (ref 32.0–36.0)
MCV: 95.5 fL (ref 80.0–100.0)
MPV: 11.8 fL (ref 7.5–12.5)
Monocytes Relative: 9.6 %
Neutro Abs: 5106 {cells}/uL (ref 1500–7800)
Neutrophils Relative %: 69 %
Platelets: 129 Thousand/uL — ABNORMAL LOW (ref 140–400)
RBC: 4.49 Million/uL (ref 4.20–5.80)
RDW: 14.1 % (ref 11.0–15.0)
Total Lymphocyte: 17.9 %
WBC: 7.4 Thousand/uL (ref 3.8–10.8)

## 2024-06-25 LAB — COMPREHENSIVE METABOLIC PANEL WITH GFR
AG Ratio: 2 (calc) (ref 1.0–2.5)
ALT: 20 U/L (ref 9–46)
AST: 18 U/L (ref 10–35)
Albumin: 4.3 g/dL (ref 3.6–5.1)
Alkaline phosphatase (APISO): 60 U/L (ref 35–144)
BUN/Creatinine Ratio: 13 (calc) (ref 6–22)
BUN: 21 mg/dL (ref 7–25)
CO2: 25 mmol/L (ref 20–32)
Calcium: 9.6 mg/dL (ref 8.6–10.3)
Chloride: 108 mmol/L (ref 98–110)
Creat: 1.67 mg/dL — ABNORMAL HIGH (ref 0.70–1.28)
Globulin: 2.2 g/dL (ref 1.9–3.7)
Glucose, Bld: 95 mg/dL (ref 65–99)
Potassium: 4.3 mmol/L (ref 3.5–5.3)
Sodium: 143 mmol/L (ref 135–146)
Total Bilirubin: 0.5 mg/dL (ref 0.2–1.2)
Total Protein: 6.5 g/dL (ref 6.1–8.1)
eGFR: 43 mL/min/1.73m2 — ABNORMAL LOW (ref >=60)

## 2024-06-25 LAB — URIC ACID: Uric Acid, Serum: 3.2 mg/dL — ABNORMAL LOW (ref 4.0–8.0)

## 2024-06-25 MED ORDER — ALLOPURINOL 300 MG PO TABS: 300.0000 mg | ORAL_TABLET | Freq: Every day | ORAL | 0 refills | Status: DC

## 2024-06-25 NOTE — Progress Notes (Signed)
 Jose [name] - 73 y.o. male MRN [id number]  Date of birth: 11-24-1951  Office Visit Note: Visit Date: 06/25/2024 PCP: Birda Ahumada, MD Referred by: [name] Reiter, MD  Subjective: No chief complaint on file.  HPI: Jose [name] is a pleasant 73 y.o. male who presents today for evaluation of ongoing right basilar thumb pain as well as triggering of the long and ring finger.  Trigger digits have been present now for multiple months, has undergone prior injections with mild relief of symptoms however he still experiences regular clicking and locking.  As for the right thumb, he has been placed into a splint with occupational therapy and has undergone prior injection for the right thumb CMC with moderate relief.  He is being seen by myself today for specific hand surgical evaluation.  Patient is known to our practice.  Pertinent ROS were reviewed with the patient and found to be negative unless otherwise specified above in HPI.   Visit Reason: Right wrist pain-Jose [name] referral-pain and the base of thumb and middle and ring trigger fingers Duration of symptoms: 3-4 months with no injury Hand dominance: right Occupation: Consultant  Diabetic: No Smoking: No Heart/Lung History: yes Blood Thinners: Eliquis   Prior Testing/EMG:xrays in epic Injections (Date): Jose [name] did two injs for trigger-helped a little , prior right thumb CMC injection with moderate relief Treatments: OT with nate  Assessment & Plan: Visit Diagnoses:  1. Trigger finger, right middle finger   2. Arthritis of carpometacarpal (CMC) joint of right thumb   3. Trigger finger, right ring finger     Plan: Extensive discussion was had with the patient today regarding his right long and ring finger trigger digit.  We discussed the etiology and pathophysiology of stenosing tenosynovitis.  We discussed conservative versus surgical treatment modalities.  From a conservative standpoint, we discussed activity  modification, splinting, therapy and injections.  From a surgical standpoint, we discussed the possibility for trigger digit release as well as all risk and benefits associated.  Given that patient has trialed conservative treatments such as prior injections and therapy with symptoms refractory to conservative care, patient is indicated for right long finger and ring finger trigger digit release.    Risks and benefits of the procedure were discussed, risks including but not limited to infection, bleeding, scarring, stiffness, nerve injury, tendon injury, vascular injury, recurrence of symptoms and need for subsequent operation.  We also discussed the appropriate postoperative protocol and timeframe for return to activities and function.  Forms of anesthesia were also discussed.  Patient expressed understanding.  Understanding the above, he would like to proceed with right long and ring trigger digit release under IV sedation.  Extensive discussion was also had with the patient today regarding his right thumb basilar joint pain.  X-rays from prior confirm diagnosis of ongoing right thumb CMC arthritis which correlates with clinical examination.  We reviewed the etiology and pathophysiology of this condition as well as appropriate treatment modalities ranging from conservative to surgical.  From a conservative standpoint, we discussed bracing, activity modification, nonsteroidal anti-inflammatory medications both oral and topical, and finally cortisone injections.  From surgical standpoint, we discussed the possibility for right thumb CMC arthroplasty in the future should symptoms refractory to conservative care.  I discussed the surgical treatment as well as the postop protocol in detail today for understanding. At this juncture, we will continue with bracing and activity modification as well as taping for the time being.  In the future, once recovered from  the trigger digits, can consider potential thumb CMC  arthroplasty however this will be a much more involved process for both surgical and postoperative recovery standpoint which the patient understands.    Follow-up: No follow-ups on file.   Meds & Orders: No orders of the defined types were placed in this encounter.  No orders of the defined types were placed in this encounter.    Procedures: No procedures performed      Clinical History: No specialty comments available.  He reports that he has never smoked. He has never been exposed to tobacco smoke. He has never used smokeless tobacco.  Recent Labs    06/26/23 1137 12/27/23 1352  LABURIC 4.1 3.5*    Objective:   Vital Signs: There were no vitals taken for this visit.  Physical Exam  Gen: Well-appearing, in no acute distress; non-toxic CV: Regular Rate. Well-perfused. Warm.  Resp: Breathing unlabored on room air; no wheezing. Psych: Fluid speech in conversation; appropriate affect; normal thought process  Ortho Exam PHYSICAL EXAM:  General: Patient is well appearing and in no distress.  Skin and Muscle: No significant skin changes are apparent to hands.  Muscle bulk and contour normal, without signs of atrophy.     Range of Motion and Palpation Tests: Mobility is full about the elbows with flexion and extension.  Forearm supination and pronation are 85/85 bilaterally.  Wrist flexion/extension is 75/65 bilaterally.  Digital flexion and extension are full.  Thumb opposition is full to the base of the small fingers bilaterally.    Palpable nodules at the right long and ring finger A1 regions, associated tenderness.  Notable stenosis with flexion of the long and ring finger on the right hand.  Moderate tenderness over the right thumb CMC articulation is observed.  No evidence of radiocarpal, midcarpal or intercarpal joint instability with provocation.  Neurologic, Vascular, Motor: Sensation is intact to light touch in the median/radial/ulnar distributions.   Fingers  pink and well perfused.  Capillary refill is brisk.      Lab Results  Component Value Date   HGBA1C 5.3 12/18/2019     Imaging: No results found. Prior wrist x-rays were reviewed, notable for right thumb CMC arthritis  Past Medical/Family/Surgical/Social History: Medications & Allergies reviewed per EMR, new medications updated. Patient Active Problem List   Diagnosis Date Noted   Hypertension    Severe aortic stenosis 10/08/2019   Candidiasis    Abnormality of gait    Hypoalbuminemia due to protein-calorie malnutrition [facility])    History of gout    Sleep disturbance    Constipation    Rupture of quadriceps tendon, right, sequela 06/19/2017   Quadriceps tendon rupture, left, sequela 06/19/2017   Acute blood loss anemia 06/19/2017   Fall    History of subdural hematoma    Post-operative pain    Recurrent falls    Quadriceps tendon rupture 06/15/2017   Primary osteoarthritis of both knees 02/28/2017   Primary osteoarthritis of both hands 02/28/2017   Uricacidemia 02/28/2017   Pain in joint of left knee 02/07/2017   Idiopathic chronic gout, unspecified site, without tophus (tophi) 11/29/2016   HEMATURIA UNSPECIFIED 01/25/2009   Abdominal pain 01/25/2009   XERODERMA 03/10/2008   UNS ADVRS EFF UNS RX MEDICINAL&BIOLOGICAL SBSTNC 03/10/2008   ADJUSTMENT REACTION WITH PHYSICAL SYMPTOMS 02/14/2008   MUSCLE SPASM 02/14/2008   ACUTE PROSTATITIS 12/16/2007   BREAST LUMP OR MASS, RIGHT 07/12/2007   H/O: RCT (rotator cuff tear) 01/13/2007   GOUT 01/08/2007   Past  Medical History:  Diagnosis Date   Arthritis    R shoulder, great toes, hands- has gout & has injections in knees with Dr. [name]    Bicuspid aortic valve with ascending aorta 4.0 to 4.5 cm in diameter    CAD (coronary artery disease)    Cancer (HCC)    skin ca-    GERD (gastroesophageal reflux disease)    Headache    History of hiatal hernia    Hypertension    Prostate cancer (HCC)    S/P TAVR  (transcatheter aortic valve replacement)    SDH (subdural hematoma) (HCC)    Severe aortic stenosis    Thoracic ascending aortic aneurysm (HCC)    Vertebral artery aneurysm (HCC)    Family History  Problem Relation Age of Onset   Parkinson's disease Mother    Heart disease Father    Parkinson's disease Brother    Diabetes Brother    Past Surgical History:  Procedure Laterality Date   BRAIN SURGERY  2016   evacuation of SDH   CARDIAC CATHETERIZATION     CARPAL TUNNEL RELEASE Bilateral    embolization of left vertebral artery aneurysm with pipeline/coil  10/31/2022   GREEN LIGHT LASER TURP (TRANSURETHRAL RESECTION OF PROSTATE  04/27/2017   KNEE ARTHROPLASTY     LEFT HEART CATH AND CORONARY ANGIOGRAPHY N/A 10/08/2019   Procedure: LEFT HEART CATH AND CORONARY ANGIOGRAPHY;  Surgeon: Wonda Sharper, MD;  Location: [facility] INVASIVE CV LAB;  Service: Cardiovascular;  Laterality: N/A;   QUADRICEPS TENDON REPAIR Bilateral 06/15/2017   Procedure: REPAIR QUADRICEP TENDON;  Surgeon: Addie Cordella Hamilton, MD;  Location: [facility] OR;  Service: Orthopedics;  Laterality: Bilateral;   SHOULDER SURGERY Right    TEE WITHOUT CARDIOVERSION N/A 07/03/2022   Procedure: TRANSESOPHAGEAL ECHOCARDIOGRAM (TEE);  Surgeon: Delford Maude BROCKS, MD;  Location: [facility] ENDOSCOPY;  Service: Cardiovascular;  Laterality: N/A;   TRANSCATHETER AORTIC VALVE REPLACEMENT, TRANSFEMORAL  12/23/2019   TRANSCATHETER AORTIC VALVE REPLACEMENT, TRANSFEMORAL N/A 12/23/2019   Procedure: TRANSCATHETER AORTIC VALVE REPLACEMENT, TRANSFEMORAL;  Surgeon: Wonda Sharper, MD;  Location: [facility] OR;  Service: Open Heart Surgery;  Laterality: N/A;   VASCULAR SURGERY     Social History   Occupational History   Not on file  Tobacco Use   Smoking status: Never    Passive exposure: Never   Smokeless tobacco: Never  Vaping Use   Vaping status: Never Used  Substance and Sexual Activity   Alcohol  use: Yes    Comment: 1 or 2 glasses of wine weekly   Drug use:  No   Sexual activity: Not on file    Marvel Sapp Afton Alderton, M.D. [facility], Hand Surgery

## 2024-06-26 ENCOUNTER — Telehealth: Payer: Self-pay

## 2024-06-26 ENCOUNTER — Ambulatory Visit: Payer: Self-pay | Admitting: Physician Assistant

## 2024-06-26 NOTE — Progress Notes (Signed)
 Uric acid WNL-3.2.  continue current dose of allopurinol .  Platelet count is low-129K. Rest of CBC WNL-please forward results to PCP. Creatinine is elevated-1.67 and GFR is low-43.  GFR dropped from 63 to 43 since January.   Please clarify if he has been taking any NSAIDs? Any other mediation changes?

## 2024-06-26 NOTE — Telephone Encounter (Signed)
   Pre-operative Risk Assessment    Patient Name: Jose [name]  DOB: [birth year] MRN: [id number]   Date of last office visit: 09/28/23 MAUDE EMMER, MD Date of next office visit: 09/29/24 MAUDE EMMER, MD   Request for Surgical Clearance    Procedure:  RIGHT RING & MIDDLE TRIGGER DIGIT RELEASE  Date of Surgery:  Clearance TBD                                Surgeon:  NOT INDICATED Surgeon's Group or Practice Name:  [facility] AT [facility] Phone number:  [phone] Fax number:  [phone]  ATTN: APRIL   Type of Clearance Requested:   - Medical  - Pharmacy:  Hold Aspirin  5 DAYS PRIOR AND ELIQUIS  2 DAYS PRIOR   Type of Anesthesia:  MAC   Additional requests/questions:    Signed, Lucie LABOR Ku   06/26/2024, 5:38 PM

## 2024-06-26 NOTE — Therapy (Signed)
 OUTPATIENT OCCUPATIONAL THERAPY TREATMENT  NOTE  Patient Name: BASEL DEFALCO MRN: [id number] DOB:04/23/1951, 73 y.o., male Today's Date: 06/27/2024  PCP: Birda Ahumada, MD REFERRING PROVIDER: [name] Reiter, MD     END OF SESSION:  OT End of Session - 06/27/24 1017     Visit Number 10    Number of Visits 20    Date for OT Re-Evaluation 07/25/24    Authorization Type Medicare    OT Start Time 1017    OT Stop Time 1058    OT Time Calculation (min) 41 min    Activity Tolerance Patient tolerated treatment well;No increased pain;Patient limited by pain;Patient limited by fatigue    Behavior During Therapy [facility] for tasks assessed/performed             Past Medical History:  Diagnosis Date   Arthritis    R shoulder, great toes, hands- has gout & has injections in knees with Dr. [name]    Bicuspid aortic valve with ascending aorta 4.0 to 4.5 cm in diameter    CAD (coronary artery disease)    Cancer (HCC)    skin ca-    GERD (gastroesophageal reflux disease)    Headache    History of hiatal hernia    Hypertension    Prostate cancer (HCC)    S/P TAVR (transcatheter aortic valve replacement)    SDH (subdural hematoma) (HCC)    Severe aortic stenosis    Thoracic ascending aortic aneurysm (HCC)    Vertebral artery aneurysm [facility])    Past Surgical History:  Procedure Laterality Date   BRAIN SURGERY  2016   evacuation of SDH   CARDIAC CATHETERIZATION     CARPAL TUNNEL RELEASE Bilateral    embolization of left vertebral artery aneurysm with pipeline/coil  10/31/2022   GREEN LIGHT LASER TURP (TRANSURETHRAL RESECTION OF PROSTATE  04/27/2017   KNEE ARTHROPLASTY     LEFT HEART CATH AND CORONARY ANGIOGRAPHY N/A 10/08/2019   Procedure: LEFT HEART CATH AND CORONARY ANGIOGRAPHY;  Surgeon: Wonda Sharper, MD;  Location: [facility] INVASIVE CV LAB;  Service: Cardiovascular;  Laterality: N/A;   QUADRICEPS TENDON REPAIR Bilateral 06/15/2017   Procedure: REPAIR QUADRICEP TENDON;   Surgeon: Addie Cordella Glendia, MD;  Location: [facility] OR;  Service: Orthopedics;  Laterality: Bilateral;   SHOULDER SURGERY Right    TEE WITHOUT CARDIOVERSION N/A 07/03/2022   Procedure: TRANSESOPHAGEAL ECHOCARDIOGRAM (TEE);  Surgeon: Delford Maude BROCKS, MD;  Location: [facility] ENDOSCOPY;  Service: Cardiovascular;  Laterality: N/A;   TRANSCATHETER AORTIC VALVE REPLACEMENT, TRANSFEMORAL  12/23/2019   TRANSCATHETER AORTIC VALVE REPLACEMENT, TRANSFEMORAL N/A 12/23/2019   Procedure: TRANSCATHETER AORTIC VALVE REPLACEMENT, TRANSFEMORAL;  Surgeon: Wonda Sharper, MD;  Location: [facility] OR;  Service: Open Heart Surgery;  Laterality: N/A;   VASCULAR SURGERY     Patient Active Problem List   Diagnosis Date Noted   Hypertension    Severe aortic stenosis 10/08/2019   Candidiasis    Abnormality of gait    Hypoalbuminemia due to protein-calorie malnutrition [facility])    History of gout    Sleep disturbance    Constipation    Rupture of quadriceps tendon, right, sequela 06/19/2017   Quadriceps tendon rupture, left, sequela 06/19/2017   Acute blood loss anemia 06/19/2017   Fall    History of subdural hematoma    Post-operative pain    Recurrent falls    Quadriceps tendon rupture 06/15/2017   Primary osteoarthritis of both knees 02/28/2017   Primary osteoarthritis of both hands 02/28/2017   Uricacidemia 02/28/2017  Pain in joint of left knee 02/07/2017   Idiopathic chronic gout, unspecified site, without tophus (tophi) 11/29/2016   HEMATURIA UNSPECIFIED 01/25/2009   Abdominal pain 01/25/2009   XERODERMA 03/10/2008   UNS ADVRS EFF UNS RX MEDICINAL&BIOLOGICAL SBSTNC 03/10/2008   ADJUSTMENT REACTION WITH PHYSICAL SYMPTOMS 02/14/2008   MUSCLE SPASM 02/14/2008   ACUTE PROSTATITIS 12/16/2007   BREAST LUMP OR MASS, RIGHT 07/12/2007   H/O: RCT (rotator cuff tear) 01/13/2007   GOUT 01/08/2007    ONSET DATE: ~6 weeks onset pain  REFERRING DIAG:  M25.531 (ICD-10-CM) - Pain in right wrist  M25.521 (ICD-10-CM) - Pain  in right elbow  M65.30 (ICD-10-CM) - Trigger finger, unspecified finger, unspecified laterality    THERAPY DIAG:  Pain in right elbow  Stiffness of right wrist, not elsewhere classified  Muscle weakness (generalized)  Rationale for Evaluation and Treatment: Rehabilitation  PERTINENT HISTORY: having problems with: Rt wrist, Rt elbow and trigger finger  currently seeing PT for shoulder pain and problems on the right side he has pain near the base of the right thumb for about 6 weeks now.  He also has triggering in his fingers 3 and 4 of bilateral hands chronically.  He has received injections to the base of the thumb hand and the wrist to help with his pain.  He is wearing a prefabricated wrist and thumb spica brace today.  He does workout frequently, trying to stay active and he is going to the gym after this, he states   PRECAUTIONS: None  RED FLAGS: None   WEIGHT BEARING RESTRICTIONS: Yes recommended to limit weightbearing through the right wrist and hand to 5 pounds or less for the next few weeks if possible.    SUBJECTIVE:   SUBJECTIVE STATEMENT: He states He has not had an exacerbation of pain, but he did meet with a hand surgeon, who recommended trigger finger release surgery.  We discussed this briefly today     PAIN:  Are you having pain? Yes: NPRS scale:    0/10 at rest now Pain location: Rt thumb CMC J and MCPJs in  Pain description: Sharp and shooting Aggravating factors: Wrist and thumb motion Relieving factors: Unsure  FALLS: Has patient fallen in last 6 months? No  PLOF: Independent  PATIENT GOALS: To improve pain in the right wrist as well as limit triggering and pain through the fingers and increase functional ability  NEXT MD VISIT: As needed   OBJECTIVE: (All objective assessments below are from initial evaluation on: 05/01/24 unless otherwise specified.)    HAND DOMINANCE: Right   ADLs: Overall ADLs: States decreased ability to grab, hold  household objects, pain and difficulty to open containers, perform FMS tasks (manipulate fasteners on clothing)   FUNCTIONAL OUTCOME MEASURES: 06/10/24: Quick DASH: 22.7%  impairment today    Eval: Quick DASH 34% impairment today  (Higher % Score  =  More Impairment)     UPPER EXTREMITY ROM     Shoulder to Wrist AROM Right eval Left eval Rt 05/07/24 Rt 05/27/24 Rt 06/10/24 Rt 06/24/24 Rt 06/27/24  Forearm supination 67 pain   77 69    Forearm pronation  85   86 85    Wrist flexion 42 72 32 58 50 15 57  Wrist extension 60 69 50 56 56 49   Wrist ulnar deviation         Wrist radial deviation         (Blank rows = not tested)   Hand AROM Right  eval Left eval  Full Fist Ability (or Gap to Distal Palmar Crease) Full fist with notable triggering of digits 4 and 3 Full fist with notable triggering of digits 4 and 3  Thumb Opposition  (Kapandji Scale)  9/10 9/10  (Blank rows = not tested)   UPPER EXTREMITY MMT:      MMT Right 05/01/2024  Forearm supination 4 -/5 painful  Forearm pronation 4/5  Wrist flexion 4 -/5 painful  Wrist extension 5/5  Wrist ulnar deviation 5/5  Wrist radial deviation 4 -/5 painful  (Blank rows = not tested)  HAND FUNCTION: 06/24/24: Rt grip : 53.3# no pain    Eval: Observed weakness in affected bilateral hands due to triggering and pain.  Strong grip not advised, but this can be tested in the future if he feels better.   COORDINATION: 06/19/24: 9 Hole Peg Test Right: TBD sec (TBD sec is WFL)   Eval: Observed coordination impairments with affected right wrist and arm due to painful tenosynovitis of the thumb tendons.  Details will be tested as needed in upcoming sessions    OBSERVATIONS:   Eval: He has a positive Finkelstein's test on the right wrist and negative on the left wrist.  He is not tender to the [facility] joint of the right thumb.  Supination is tight and painful near the base of the thumb as well.  Today he presents as deQuervain's  Tenosynovitis of the right thumb extensor tendons and triggering (or stenosing tenosynovitis) of the bilateral middle fingers and ring fingers.    TODAY'S TREATMENT:  06/27/24: He starts with active range of motion for exercise as well as new measures which shows he is moving better and tolerating exercises well again.  We reviewed and discussed new very light isometric strengthening with a fist, with wrist extension and wrist flexion.  He states understanding to be very cautious, only do this twice a day, continue with caution, moist heat, stretches more frequently.  OT does IASTM around the sore areas around the thumb and wrist, guides him again through his stretching exercise routine including these isometrics with which he can do more rigorously today and has no soreness or pain after.  He was still urged to use caution over the weekend and try to wean out of the orthosis for lighter things slightly more often now..    He states understanding all directions today and leaves with no significant pain.      Exercises performed and reviewed today: - Forearm Supination Stretch  - 3-4 x daily - 3-5 reps - 15 sec hold - Forearm Pronation Stretch  - 3-4 x daily - 3-5 reps - 15 sec hold - Wrist Flexion Stretch  - 4 x daily - 3-5 reps - 15 sec hold - Seated Wrist Extension Stretch  - 3-6 x daily - 3-5 reps - 15 hold - Stretch Thumb DOWNWARD  - 2-3 x daily - 3 reps - 15 sec hold - HOOK Stretch  - 4 x daily - 3-5 reps - 15-20 sec hold - Full Fist  - 2-3 x daily - 5 reps - 10 sec hold - Seated Isometric Wrist Flexion Supinated with Manual Resistance  - 4-6 x daily - 1 sets - 10-15 reps - Isometric Wrist Extension Pronated  - 2-3 x daily - 4-5 x weekly - 5 reps - 5-10 seconds hold    PATIENT EDUCATION: Education details: See tx section above for details  Person educated: Patient Education method: Verbal Instruction, Teach back,  Handouts  Education comprehension: States and demonstrates understanding,  Additional Education required    HOME EXERCISE PROGRAM: Access Code: [email or web address] URL: [email or web address] Date: 05/01/2024 Prepared by: Melvenia Ada   GOALS: Goals reviewed with patient? Yes   SHORT TERM GOALS: (STG required if POC>30 days) Target Date: 05/09/2024  Pt will obtain protective, custom orthotic. Goal status: 05/05/2024: Met  2.  Pt will demo/state understanding of initial HEP to improve pain levels and prerequisite motion. Goal status: 06/10/24: MET   LONG TERM GOALS: Target Date: 07/25/24  Pt will improve functional ability by decreased impairment per Quick DASH assessment from 34% to 15% or better, for better quality of life. Goal status: 06/10/24: improved to 22.7% now   2.  Pt will improve grip strength in right hand from weak and painful to at least nonpainful 30 lbs for functional use at home and in IADLs. Goal status: 06/10/24: NT due to exacerbation   3.  Pt will improve A/ROM in right wrist flexion from painful 42 degrees to at least nonpainful 60 degrees, to have functional motion for tasks like reach and grasp.  Goal status:06/10/24: improved to 50* now   4.  Pt will improve strength in right wrist radial deviation from painful 4 -/5 MMT to at least 4+/5 MMT to have increased functional ability to carry out selfcare and higher-level homecare tasks with less difficulty. Goal status: 06/10/24: NT due to exacerbation   5. Pt will decrease pain at worst from 10/10 to 4/10 or better to have better sleep and occupational participation in daily roles. Goal status:06/10/24: improving, but still having exacerbations    ASSESSMENT:  CLINICAL IMPRESSION: 06/27/24: Doing very well and possibly beating thumb and wrist tenosynovitis.  Unfortunately hand tenosynovitis is very stubborn, though he does not have a Band-Aid today and does not seem to be having high pain there.   06/24/24: As he had no pain for the past 2 weeks for the most part, he advance  to light isometric strengthening again.  Unfortunately he did wean from the orthosis and go to the gym today and perform some strengthening which did seem to flare him up somewhat.  He was strongly cautioned to be very cautious with this and slow down, wear the brace, only do therapy exercises if anything at the gym or other settings is painful or hurting him.   06/17/24: It is great that he is back down to having no significant pain at rest.  It is also good if he is tolerating better stretches, he is less tender to palpation.  We may be able to start strengthening this Thursday, but it will need to be very cautious and done after a warm up and stretches, otherwise we will wait until next week  06/10/24: Unfortunately Raymond has had an exacerbation in the past 2 weeks consecutively in his not been able to move forward with her plan of care.  Therapy has worked to lower his pain in the past, however, and so he wishes to continue therapy and he will try to be more thoughtful about not taking ditches or causing exacerbations.  Etc. OT agrees with this plan and agrees to try 6 more weeks of therapy up to 2 times a week to ensure he is not having these exacerbations of pain and to hopefully move into strengthening sooner than later to help prevent exacerbations in the future.   PLAN:  OT FREQUENCY: 1-2x/week  OT DURATION: 6 additional weeks from 06/13/2024 - 07/25/24 and up to  20 total visits as needed  PLANNED INTERVENTIONS: 97535 self care/ADL training, 02889 therapeutic exercise, 97530 therapeutic activity, 97112 neuromuscular re-education, 97140 manual therapy, 97035 ultrasound, 97760 Orthotic Initial, 97763 Orthotic/Prosthetic subsequent, Dry needling, coping strategies training, patient/family education, and DME and/or AE instructions  RECOMMENDED OTHER SERVICES: In PT for shoulder pain and problems currently  CONSULTED AND AGREED WITH PLAN OF CARE: Patient  PLAN FOR NEXT SESSION:   Upgrade  strengthening to include deviation and/or thumb extension very carefully next week.  This needs to be be done very conservatively after moist heat and stretching still.  Consider K tape again.  Melvenia Ada, OTR/L, CHT 06/27/2024, 11:02 AM

## 2024-06-27 ENCOUNTER — Encounter: Payer: Self-pay | Admitting: Rehabilitative and Restorative Service Providers"

## 2024-06-27 ENCOUNTER — Ambulatory Visit (INDEPENDENT_AMBULATORY_CARE_PROVIDER_SITE_OTHER): Admitting: Rehabilitative and Restorative Service Providers"

## 2024-06-27 DIAGNOSIS — M25521 Pain in right elbow: Secondary | ICD-10-CM | POA: Diagnosis not present

## 2024-06-27 DIAGNOSIS — M25631 Stiffness of right wrist, not elsewhere classified: Secondary | ICD-10-CM

## 2024-06-27 DIAGNOSIS — M6281 Muscle weakness (generalized): Secondary | ICD-10-CM | POA: Diagnosis not present

## 2024-07-01 ENCOUNTER — Encounter: Payer: Self-pay | Admitting: Rehabilitative and Restorative Service Providers"

## 2024-07-01 ENCOUNTER — Ambulatory Visit (INDEPENDENT_AMBULATORY_CARE_PROVIDER_SITE_OTHER): Admitting: Rehabilitative and Restorative Service Providers"

## 2024-07-01 DIAGNOSIS — M25521 Pain in right elbow: Secondary | ICD-10-CM

## 2024-07-01 DIAGNOSIS — M6281 Muscle weakness (generalized): Secondary | ICD-10-CM | POA: Diagnosis not present

## 2024-07-01 DIAGNOSIS — M25631 Stiffness of right wrist, not elsewhere classified: Secondary | ICD-10-CM

## 2024-07-01 NOTE — Therapy (Signed)
 OUTPATIENT OCCUPATIONAL THERAPY TREATMENT  NOTE  Patient Name: Jose [name] MRN: [id number] DOB:Sep 08, 1951, 73 y.o., male Today's Date: 07/01/2024  PCP: Birda Ahumada, MD REFERRING PROVIDER: [name] Reiter, MD     END OF SESSION:  OT End of Session - 07/01/24 1100     Visit Number 11    Number of Visits 20    Date for OT Re-Evaluation 07/25/24    Authorization Type Medicare    OT Start Time 1100    OT Stop Time 1144    OT Time Calculation (min) 44 min    Activity Tolerance Patient tolerated treatment well;No increased pain;Patient limited by pain;Patient limited by fatigue    Behavior During Therapy [facility] for tasks assessed/performed              Past Medical History:  Diagnosis Date   Arthritis    R shoulder, great toes, hands- has gout & has injections in knees with Dr. [name]    Bicuspid aortic valve with ascending aorta 4.0 to 4.5 cm in diameter    CAD (coronary artery disease)    Cancer (HCC)    skin ca-    GERD (gastroesophageal reflux disease)    Headache    History of hiatal hernia    Hypertension    Prostate cancer (HCC)    S/P TAVR (transcatheter aortic valve replacement)    SDH (subdural hematoma) (HCC)    Severe aortic stenosis    Thoracic ascending aortic aneurysm (HCC)    Vertebral artery aneurysm [facility])    Past Surgical History:  Procedure Laterality Date   BRAIN SURGERY  2016   evacuation of SDH   CARDIAC CATHETERIZATION     CARPAL TUNNEL RELEASE Bilateral    embolization of left vertebral artery aneurysm with pipeline/coil  10/31/2022   GREEN LIGHT LASER TURP (TRANSURETHRAL RESECTION OF PROSTATE  04/27/2017   KNEE ARTHROPLASTY     LEFT HEART CATH AND CORONARY ANGIOGRAPHY N/A 10/08/2019   Procedure: LEFT HEART CATH AND CORONARY ANGIOGRAPHY;  Surgeon: Wonda Sharper, MD;  Location: [facility] INVASIVE CV LAB;  Service: Cardiovascular;  Laterality: N/A;   QUADRICEPS TENDON REPAIR Bilateral 06/15/2017   Procedure: REPAIR QUADRICEP TENDON;   Surgeon: Addie Cordella Glendia, MD;  Location: [facility] OR;  Service: Orthopedics;  Laterality: Bilateral;   SHOULDER SURGERY Right    TEE WITHOUT CARDIOVERSION N/A 07/03/2022   Procedure: TRANSESOPHAGEAL ECHOCARDIOGRAM (TEE);  Surgeon: Delford Maude BROCKS, MD;  Location: [facility] ENDOSCOPY;  Service: Cardiovascular;  Laterality: N/A;   TRANSCATHETER AORTIC VALVE REPLACEMENT, TRANSFEMORAL  12/23/2019   TRANSCATHETER AORTIC VALVE REPLACEMENT, TRANSFEMORAL N/A 12/23/2019   Procedure: TRANSCATHETER AORTIC VALVE REPLACEMENT, TRANSFEMORAL;  Surgeon: Wonda Sharper, MD;  Location: [facility] OR;  Service: Open Heart Surgery;  Laterality: N/A;   VASCULAR SURGERY     Patient Active Problem List   Diagnosis Date Noted   Hypertension    Severe aortic stenosis 10/08/2019   Candidiasis    Abnormality of gait    Hypoalbuminemia due to protein-calorie malnutrition [facility])    History of gout    Sleep disturbance    Constipation    Rupture of quadriceps tendon, right, sequela 06/19/2017   Quadriceps tendon rupture, left, sequela 06/19/2017   Acute blood loss anemia 06/19/2017   Fall    History of subdural hematoma    Post-operative pain    Recurrent falls    Quadriceps tendon rupture 06/15/2017   Primary osteoarthritis of both knees 02/28/2017   Primary osteoarthritis of both hands 02/28/2017   Uricacidemia  02/28/2017   Pain in joint of left knee 02/07/2017   Idiopathic chronic gout, unspecified site, without tophus (tophi) 11/29/2016   HEMATURIA UNSPECIFIED 01/25/2009   Abdominal pain 01/25/2009   XERODERMA 03/10/2008   UNS ADVRS EFF UNS RX MEDICINAL&BIOLOGICAL SBSTNC 03/10/2008   ADJUSTMENT REACTION WITH PHYSICAL SYMPTOMS 02/14/2008   MUSCLE SPASM 02/14/2008   ACUTE PROSTATITIS 12/16/2007   BREAST LUMP OR MASS, RIGHT 07/12/2007   H/O: RCT (rotator cuff tear) 01/13/2007   GOUT 01/08/2007    ONSET DATE: ~6 weeks onset pain  REFERRING DIAG:  M25.531 (ICD-10-CM) - Pain in right wrist  M25.521 (ICD-10-CM) - Pain  in right elbow  M65.30 (ICD-10-CM) - Trigger finger, unspecified finger, unspecified laterality    THERAPY DIAG:  Pain in right elbow  Stiffness of right wrist, not elsewhere classified  Muscle weakness (generalized)  Rationale for Evaluation and Treatment: Rehabilitation  PERTINENT HISTORY: having problems with: Rt wrist, Rt elbow and trigger finger  currently seeing PT for shoulder pain and problems on the right side he has pain near the base of the right thumb for about 6 weeks now.  He also has triggering in his fingers 3 and 4 of bilateral hands chronically.  He has received injections to the base of the thumb hand and the wrist to help with his pain.  He is wearing a prefabricated wrist and thumb spica brace today.  He does workout frequently, trying to stay active and he is going to the gym after this, he states   PRECAUTIONS: None  RED FLAGS: None   WEIGHT BEARING RESTRICTIONS: Yes recommended to limit weightbearing through the right wrist and hand to 5 pounds or less for the next few weeks if possible.    SUBJECTIVE:   SUBJECTIVE STATEMENT: He states doing well, not having significant pain or exacerbation.     PAIN:  Are you having pain? Yes: NPRS scale:    0-1/10 at rest now Pain location: Rt thumb CMC J and MCPJs in  Pain description: Sharp and shooting Aggravating factors: Wrist and thumb motion Relieving factors: Unsure  FALLS: Has patient fallen in last 6 months? No  PLOF: Independent  PATIENT GOALS: To improve pain in the right wrist as well as limit triggering and pain through the fingers and increase functional ability  NEXT MD VISIT: As needed   OBJECTIVE: (All objective assessments below are from initial evaluation on: 05/01/24 unless otherwise specified.)    HAND DOMINANCE: Right   ADLs: Overall ADLs: States decreased ability to grab, hold household objects, pain and difficulty to open containers, perform FMS tasks (manipulate fasteners on  clothing)   FUNCTIONAL OUTCOME MEASURES: 06/10/24: Quick DASH: 22.7%  impairment today    Eval: Quick DASH 34% impairment today  (Higher % Score  =  More Impairment)     UPPER EXTREMITY ROM     Shoulder to Wrist AROM Right eval Left eval Rt 05/07/24 Rt 05/27/24 Rt 06/10/24 Rt 06/24/24 Rt 06/27/24 Rt 07/01/24  Forearm supination 67 pain   77 69     Forearm pronation  85   86 85     Wrist flexion 42 72 32 58 50 15 57 59  Wrist extension 60 69 50 56 56 49  60  Wrist ulnar deviation          Wrist radial deviation          (Blank rows = not tested)   Hand AROM Right eval Left eval  Full Fist Ability (or Gap  to Distal Palmar Crease) Full fist with notable triggering of digits 4 and 3 Full fist with notable triggering of digits 4 and 3  Thumb Opposition  (Kapandji Scale)  9/10 9/10  (Blank rows = not tested)   UPPER EXTREMITY MMT:      MMT Right 05/01/2024  Forearm supination 4 -/5 painful  Forearm pronation 4/5  Wrist flexion 4 -/5 painful  Wrist extension 5/5  Wrist ulnar deviation 5/5  Wrist radial deviation 4 -/5 painful  (Blank rows = not tested)  HAND FUNCTION: 07/04/24: Grip Rt: TBD #   06/24/24: Rt grip : 53.3# no pain    Eval: Observed weakness in affected bilateral hands due to triggering and pain.  Strong grip not advised, but this can be tested in the future if he feels better.   COORDINATION: 06/19/24: 9 Hole Peg Test Right: TBD sec (TBD sec is WFL)   Eval: Observed coordination impairments with affected right wrist and arm due to painful tenosynovitis of the thumb tendons.  Details will be tested as needed in upcoming sessions    OBSERVATIONS:   Eval: He has a positive Finkelstein's test on the right wrist and negative on the left wrist.  He is not tender to the [facility] joint of the right thumb.  Supination is tight and painful near the base of the thumb as well.  Today he presents as deQuervain's Tenosynovitis of the right thumb extensor tendons and  triggering (or stenosing tenosynovitis) of the bilateral middle fingers and ring fingers.    TODAY'S TREATMENT:  07/01/24: He does not have exacerbation still, his motion seems to be improved, and he is tolerating light isometric strengthening.  While on moist heat for 3 minutes OT reviews exercises, then performs manual therapy IASTM through the sore dorsal compartment as he states this helps.  Next, OT applies Kinesiotape tape to the thumb and extensors to help support them and prevent injury.  OT leads him through his stretches at the wrist and the thumb, then new isometric strengthening for grip, wrist extension, wrist flexion.  He had some tenderness with wrist flexion and was told to go very cautious in that plane of motion.  OT adds two new isometrics including thumb extension isometrics and wrist radial deviation isometrics.  He tolerates these well and they were added to his home exercise program cautiously.    He leaves in no significant pain   PATIENT EDUCATION: Education details: See tx section above for details  Person educated: Patient Education method: Verbal Instruction, Teach back, Handouts  Education comprehension: States and demonstrates understanding, Additional Education required    HOME EXERCISE PROGRAM: Access Code: [email or web address] URL: [email or web address] Date: 05/01/2024 Prepared by: Melvenia Ada   GOALS: Goals reviewed with patient? Yes   SHORT TERM GOALS: (STG required if POC>30 days) Target Date: 05/09/2024  Pt will obtain protective, custom orthotic. Goal status: 05/05/2024: Met  2.  Pt will demo/state understanding of initial HEP to improve pain levels and prerequisite motion. Goal status: 06/10/24: MET   LONG TERM GOALS: Target Date: 07/25/24  Pt will improve functional ability by decreased impairment per Quick DASH assessment from 34% to 15% or better, for better quality of life. Goal status: 06/10/24: improved to 22.7% now   2.  Pt will  improve grip strength in right hand from weak and painful to at least nonpainful 30 lbs for functional use at home and in IADLs. Goal status: 06/10/24: NT due to exacerbation   3.  Pt will improve  A/ROM in right wrist flexion from painful 42 degrees to at least nonpainful 60 degrees, to have functional motion for tasks like reach and grasp.  Goal status:06/10/24: improved to 50* now   4.  Pt will improve strength in right wrist radial deviation from painful 4 -/5 MMT to at least 4+/5 MMT to have increased functional ability to carry out selfcare and higher-level homecare tasks with less difficulty. Goal status: 06/10/24: NT due to exacerbation   5. Pt will decrease pain at worst from 10/10 to 4/10 or better to have better sleep and occupational participation in daily roles. Goal status:06/10/24: improving, but still having exacerbations    ASSESSMENT:  CLINICAL IMPRESSION: 07/01/24: He continues to move along without exacerbation.  OT may consider reducing appointments to 1, week as long as he is confident that he will not cause exacerbation of symptoms.    PLAN:  OT FREQUENCY: 1-2x/week  OT DURATION: 6 additional weeks from 06/13/2024 - 07/25/24 and up to 20 total visits as needed  PLANNED INTERVENTIONS: 97535 self care/ADL training, 02889 therapeutic exercise, 97530 therapeutic activity, 97112 neuromuscular re-education, 97140 manual therapy, 97035 ultrasound, 97760 Orthotic Initial, 97763 Orthotic/Prosthetic subsequent, Dry needling, coping strategies training, patient/family education, and DME and/or AE instructions  RECOMMENDED OTHER SERVICES: In PT for shoulder pain and problems currently  CONSULTED AND AGREED WITH PLAN OF CARE: Patient  PLAN FOR NEXT SESSION:   We will try to work into light eccentric training at the wrist the next session as long as this is well-tolerated.  Check grip and cautiously discussed weaning orthosis as well.   Melvenia Ada, OTR/L, CHT 07/01/2024,  11:53 AM

## 2024-07-03 NOTE — Therapy (Signed)
 OUTPATIENT OCCUPATIONAL THERAPY TREATMENT  NOTE  Patient Name: Jose [name] MRN: [id number] DOB:02/10/1951, 73 y.o., male Today's Date: 07/04/2024  PCP: Birda Ahumada, MD REFERRING PROVIDER: [name] Reiter, MD     END OF SESSION:  OT End of Session - 07/04/24 1107     Visit Number 12    Number of Visits 20    Date for OT Re-Evaluation 07/25/24    Authorization Type Medicare    OT Start Time 1107    OT Stop Time 1145    OT Time Calculation (min) 38 min    Activity Tolerance Patient tolerated treatment well;No increased pain;Patient limited by pain;Patient limited by fatigue    Behavior During Therapy [facility] for tasks assessed/performed            Past Medical History:  Diagnosis Date   Arthritis    R shoulder, great toes, hands- has gout & has injections in knees with Dr. [name]    Bicuspid aortic valve with ascending aorta 4.0 to 4.5 cm in diameter    CAD (coronary artery disease)    Cancer (HCC)    skin ca-    GERD (gastroesophageal reflux disease)    Headache    History of hiatal hernia    Hypertension    Prostate cancer (HCC)    S/P TAVR (transcatheter aortic valve replacement)    SDH (subdural hematoma) (HCC)    Severe aortic stenosis    Thoracic ascending aortic aneurysm (HCC)    Vertebral artery aneurysm [facility])    Past Surgical History:  Procedure Laterality Date   BRAIN SURGERY  2016   evacuation of SDH   CARDIAC CATHETERIZATION     CARPAL TUNNEL RELEASE Bilateral    embolization of left vertebral artery aneurysm with pipeline/coil  10/31/2022   GREEN LIGHT LASER TURP (TRANSURETHRAL RESECTION OF PROSTATE  04/27/2017   KNEE ARTHROPLASTY     LEFT HEART CATH AND CORONARY ANGIOGRAPHY N/A 10/08/2019   Procedure: LEFT HEART CATH AND CORONARY ANGIOGRAPHY;  Surgeon: Wonda Sharper, MD;  Location: [facility] INVASIVE CV LAB;  Service: Cardiovascular;  Laterality: N/A;   QUADRICEPS TENDON REPAIR Bilateral 06/15/2017   Procedure: REPAIR QUADRICEP TENDON;   Surgeon: Addie Cordella Glendia, MD;  Location: [facility] OR;  Service: Orthopedics;  Laterality: Bilateral;   SHOULDER SURGERY Right    TEE WITHOUT CARDIOVERSION N/A 07/03/2022   Procedure: TRANSESOPHAGEAL ECHOCARDIOGRAM (TEE);  Surgeon: Delford Maude BROCKS, MD;  Location: [facility] ENDOSCOPY;  Service: Cardiovascular;  Laterality: N/A;   TRANSCATHETER AORTIC VALVE REPLACEMENT, TRANSFEMORAL  12/23/2019   TRANSCATHETER AORTIC VALVE REPLACEMENT, TRANSFEMORAL N/A 12/23/2019   Procedure: TRANSCATHETER AORTIC VALVE REPLACEMENT, TRANSFEMORAL;  Surgeon: Wonda Sharper, MD;  Location: [facility] OR;  Service: Open Heart Surgery;  Laterality: N/A;   VASCULAR SURGERY     Patient Active Problem List   Diagnosis Date Noted   Hypertension    Severe aortic stenosis 10/08/2019   Candidiasis    Abnormality of gait    Hypoalbuminemia due to protein-calorie malnutrition [facility])    History of gout    Sleep disturbance    Constipation    Rupture of quadriceps tendon, right, sequela 06/19/2017   Quadriceps tendon rupture, left, sequela 06/19/2017   Acute blood loss anemia 06/19/2017   Fall    History of subdural hematoma    Post-operative pain    Recurrent falls    Quadriceps tendon rupture 06/15/2017   Primary osteoarthritis of both knees 02/28/2017   Primary osteoarthritis of both hands 02/28/2017   Uricacidemia 02/28/2017  Pain in joint of left knee 02/07/2017   Idiopathic chronic gout, unspecified site, without tophus (tophi) 11/29/2016   HEMATURIA UNSPECIFIED 01/25/2009   Abdominal pain 01/25/2009   XERODERMA 03/10/2008   UNS ADVRS EFF UNS RX MEDICINAL&BIOLOGICAL SBSTNC 03/10/2008   ADJUSTMENT REACTION WITH PHYSICAL SYMPTOMS 02/14/2008   MUSCLE SPASM 02/14/2008   ACUTE PROSTATITIS 12/16/2007   BREAST LUMP OR MASS, RIGHT 07/12/2007   H/O: RCT (rotator cuff tear) 01/13/2007   GOUT 01/08/2007    ONSET DATE: ~6 weeks onset pain  REFERRING DIAG:  M25.531 (ICD-10-CM) - Pain in right wrist  M25.521 (ICD-10-CM) - Pain  in right elbow  M65.30 (ICD-10-CM) - Trigger finger, unspecified finger, unspecified laterality    THERAPY DIAG:  Pain in right elbow  Stiffness of right wrist, not elsewhere classified  Muscle weakness (generalized)  Rationale for Evaluation and Treatment: Rehabilitation  PERTINENT HISTORY: having problems with: Rt wrist, Rt elbow and trigger finger  currently seeing PT for shoulder pain and problems on the right side he has pain near the base of the right thumb for about 6 weeks now.  He also has triggering in his fingers 3 and 4 of bilateral hands chronically.  He has received injections to the base of the thumb hand and the wrist to help with his pain.  He is wearing a prefabricated wrist and thumb spica brace today.  He does workout frequently, trying to stay active and he is going to the gym after this, he states   PRECAUTIONS: None  RED FLAGS: None   WEIGHT BEARING RESTRICTIONS: Yes recommended to limit weightbearing through the right wrist and hand to 5 pounds or less for the next few weeks if possible.    SUBJECTIVE:   SUBJECTIVE STATEMENT: He states soreness but no pain, by the end of the day his muscles are tired but this does not linger into the next morning.  New exercises have been tolerated fairly well    PAIN:  Are you having pain? Yes: NPRS scale:    0-1/10 at rest now Pain location: Rt thumb CMC J and MCPJs in  Pain description: Sharp and shooting Aggravating factors: Wrist and thumb motion Relieving factors: Unsure  FALLS: Has patient fallen in last 6 months? No  PLOF: Independent  PATIENT GOALS: To improve pain in the right wrist as well as limit triggering and pain through the fingers and increase functional ability  NEXT MD VISIT: As needed   OBJECTIVE: (All objective assessments below are from initial evaluation on: 05/01/24 unless otherwise specified.)    HAND DOMINANCE: Right   ADLs: Overall ADLs: States decreased ability to grab, hold  household objects, pain and difficulty to open containers, perform FMS tasks (manipulate fasteners on clothing)   FUNCTIONAL OUTCOME MEASURES: 06/10/24: Quick DASH: 22.7%  impairment today    Eval: Quick DASH 34% impairment today  (Higher % Score  =  More Impairment)     UPPER EXTREMITY ROM     Shoulder to Wrist AROM Right eval Left eval Rt 05/07/24 Rt 05/27/24 Rt 06/10/24 Rt 06/24/24 Rt 06/27/24 Rt 07/01/24 Rt 07/04/24  Forearm supination 67 pain   77 69      Forearm pronation  85   86 85      Wrist flexion 42 72 32 58 50 15 57 59 48  Wrist extension 60 69 50 56 56 49  60 63  (Blank rows = not tested)   Hand AROM Right eval Left eval  Full Fist Ability (or  Gap to Distal Palmar Crease) Full fist with notable triggering of digits 4 and 3 Full fist with notable triggering of digits 4 and 3  Thumb Opposition  (Kapandji Scale)  9/10 9/10  (Blank rows = not tested)   UPPER EXTREMITY MMT:      MMT Right 05/01/2024  Forearm supination 4 -/5 painful  Forearm pronation 4/5  Wrist flexion 4 -/5 painful  Wrist extension 5/5  Wrist ulnar deviation 5/5  Wrist radial deviation 4 -/5 painful  (Blank rows = not tested)  HAND FUNCTION: 07/04/24: Grip Rt: 80 #   06/24/24: Rt grip : 53.3# no pain    Eval: Observed weakness in affected bilateral hands due to triggering and pain.  Strong grip not advised, but this can be tested in the future if he feels better.   COORDINATION: 06/19/24: 9 Hole Peg Test Right: TBD sec (TBD sec is WFL)   Eval: Observed coordination impairments with affected right wrist and arm due to painful tenosynovitis of the thumb tendons.  Details will be tested as needed in upcoming sessions    OBSERVATIONS:   Eval: He has a positive Finkelstein's test on the right wrist and negative on the left wrist.  He is not tender to the [facility] joint of the right thumb.  Supination is tight and painful near the base of the thumb as well.  Today he presents as deQuervain's  Tenosynovitis of the right thumb extensor tendons and triggering (or stenosing tenosynovitis) of the bilateral middle fingers and ring fingers.    TODAY'S TREATMENT:  07/04/24: While on moist heat for approximately 4 minutes, we discussed his new strengthening isometric activities, and OT advises him to be more vigorous with wrist extension but still very cautious with wrist flexion.  His motion is not up today which is somewhat concerning, but his grip is much improved which is encouraging.  We reviewed his stretches and make some modifications and upgrades, also removing the superfluous pronation stretch.  OT educates for new putty activity-thumb press to target thumb extensors more.  We continue to discuss weaning from the orthosis and continued safety for the prevention of exacerbation of pain.  OT also reapplied his new Kinesiotape today to support the thumb and the wrist.  He states understanding all directions, states that it is working but he would still like to get in touch with the surgeon about trigger finger releases, as those are difficult to manage and tend to linger and not do well with only conservative treatment..  OT will send this note to this hand surgeon.    Exercises performed and reviewed today: - Forearm Supination Stretch  - 3-4 x daily - 3 reps - 15 sec hold - Wrist Flexion Stretch  - 4 x daily - 3-5 reps - 15 sec hold - Seated Wrist Extension Stretch  - 3-6 x daily - 3-5 reps - 15 hold - Stretch Thumb DOWNWARD  - 2-3 x daily - 3 reps - 15 sec hold - HOOK Stretch  - 4 x daily - 3-5 reps - 15-20 sec hold - Isometric Wrist Extension Pronated  - 2-3 x daily - 4-5 x weekly - 5 reps - 5-10 seconds hold - Seated Isometric Wrist Radial Deviation with Manual Resistance  - 4-6 x daily - 1 sets - 10-15 reps - Seated Isometric Wrist Flexion Supinated with Manual Resistance  - 4-6 x daily - 1 sets - 10-15 reps - Full Fist  - 2-3 x daily - 5 reps -  10 sec hold - Thumb Press  - 2-3  x daily - 5 reps     PATIENT EDUCATION: Education details: See tx section above for details  Person educated: Patient Education method: Verbal Instruction, Teach back, Handouts  Education comprehension: States and demonstrates understanding, Additional Education required    HOME EXERCISE PROGRAM: Access Code: [email or web address] URL: [email or web address] Date: 05/01/2024 Prepared by: Melvenia Ada   GOALS: Goals reviewed with patient? Yes   SHORT TERM GOALS: (STG required if POC>30 days) Target Date: 05/09/2024  Pt will obtain protective, custom orthotic. Goal status: 05/05/2024: Met  2.  Pt will demo/state understanding of initial HEP to improve pain levels and prerequisite motion. Goal status: 06/10/24: MET   LONG TERM GOALS: Target Date: 07/25/24  Pt will improve functional ability by decreased impairment per Quick DASH assessment from 34% to 15% or better, for better quality of life. Goal status: 06/10/24: improved to 22.7% now   2.  Pt will improve grip strength in right hand from weak and painful to at least nonpainful 30 lbs for functional use at home and in IADLs. Goal status: 06/10/24: NT due to exacerbation   3.  Pt will improve A/ROM in right wrist flexion from painful 42 degrees to at least nonpainful 60 degrees, to have functional motion for tasks like reach and grasp.  Goal status:06/10/24: improved to 50* now   4.  Pt will improve strength in right wrist radial deviation from painful 4 -/5 MMT to at least 4+/5 MMT to have increased functional ability to carry out selfcare and higher-level homecare tasks with less difficulty. Goal status: 06/10/24: NT due to exacerbation   5. Pt will decrease pain at worst from 10/10 to 4/10 or better to have better sleep and occupational participation in daily roles. Goal status:06/10/24: improving, but still having exacerbations    ASSESSMENT:  CLINICAL IMPRESSION: 07/04/24: Doing well and managing de Quervain's  tenosynovitis better and no signs of thumb arthritis now-but he would like to speak to the hand surgeon about scheduling trigger finger releases.     PLAN:  OT FREQUENCY: 1-2x/week  OT DURATION: 6 additional weeks from 06/13/2024 - 07/25/24 and up to 20 total visits as needed  PLANNED INTERVENTIONS: 97535 self care/ADL training, 02889 therapeutic exercise, 97530 therapeutic activity, 97112 neuromuscular re-education, 97140 manual therapy, 97035 ultrasound, 97760 Orthotic Initial, 97763 Orthotic/Prosthetic subsequent, Dry needling, coping strategies training, patient/family education, and DME and/or AE instructions  RECOMMENDED OTHER SERVICES: In PT for shoulder pain and problems currently  CONSULTED AND AGREED WITH PLAN OF CARE: Patient  PLAN FOR NEXT SESSION:   We will keep therapy twice a week for next week, then drop to 1 week, and also need a progress note.  In 2 weeks, he may be appropriate for discharge if Decore veins is managed and he sees the surgeon for trigger finger release  Melvenia Ada, OTR/L, CHT 07/04/2024, 12:41 PM

## 2024-07-04 ENCOUNTER — Encounter: Payer: Self-pay | Admitting: Rehabilitative and Restorative Service Providers"

## 2024-07-04 ENCOUNTER — Ambulatory Visit: Admitting: Rehabilitative and Restorative Service Providers"

## 2024-07-04 DIAGNOSIS — M6281 Muscle weakness (generalized): Secondary | ICD-10-CM | POA: Diagnosis not present

## 2024-07-04 DIAGNOSIS — M25521 Pain in right elbow: Secondary | ICD-10-CM | POA: Diagnosis not present

## 2024-07-04 DIAGNOSIS — M25631 Stiffness of right wrist, not elsewhere classified: Secondary | ICD-10-CM | POA: Diagnosis not present

## 2024-07-04 NOTE — Telephone Encounter (Signed)
 Patient on Eliquis  for diagnosis of HALT/HAM 06/2022.  Will need to defer to primary cardiologist for approval to hold.  Patient with diagnosis of HALT/HAM (06/2022) on Eliquis  for anticoagulation.    Procedure:  RIGHT RING & MIDDLE TRIGGER DIGIT RELEASE   Date of Surgery:  Clearance TBD  CrCl 50 Platelet count 129  Patient has not had an Afib/aflutter ablation within the last 3 months or DCCV within the last 30 days  As this diagnosis is not part of the pharmacy clearance protocol, will need to defer to primary cardiologist for approval.  **This guidance is not considered finalized until pre-operative APP has relayed final recommendations.**

## 2024-07-07 ENCOUNTER — Telehealth: Payer: Self-pay | Admitting: *Deleted

## 2024-07-07 ENCOUNTER — Ambulatory Visit (INDEPENDENT_AMBULATORY_CARE_PROVIDER_SITE_OTHER): Admitting: Rehabilitative and Restorative Service Providers"

## 2024-07-07 ENCOUNTER — Encounter: Payer: Self-pay | Admitting: Rehabilitative and Restorative Service Providers"

## 2024-07-07 DIAGNOSIS — M25631 Stiffness of right wrist, not elsewhere classified: Secondary | ICD-10-CM

## 2024-07-07 DIAGNOSIS — M6281 Muscle weakness (generalized): Secondary | ICD-10-CM | POA: Diagnosis not present

## 2024-07-07 DIAGNOSIS — M25521 Pain in right elbow: Secondary | ICD-10-CM | POA: Diagnosis not present

## 2024-07-07 NOTE — Telephone Encounter (Signed)
 Pt has been scheduled tele preop appt 07/11/24. Pt states surgeon office waiting on clearance before scheduling surgery.   Med rec and consent are done.

## 2024-07-07 NOTE — Telephone Encounter (Signed)
 Primary Cardiologist:Peter Delford, MD   Preoperative team, please contact this patient and set up a phone call appointment for further preoperative risk assessment. Please obtain consent and complete medication review. Thank you for your help.   I confirm that guidance regarding antiplatelet and oral anticoagulation therapy has been completed and, if necessary, noted below.  Per office protocol and pending no concerning cardiac symptoms at the time of the call, he may hold aspirin  for 5-7 days prior to procedure and should resume as soon as hemodynamically stable postoperatively.  Per office protocol, he may hold Eliquis  for 2 days prior to procedure and should resume as soon as hemodynamically stable postoperatively.  I also confirmed the patient resides in the state of [place] . As per [facility] Medical Board telemedicine laws, the patient must reside in the state in which the provider is licensed.   Rosaline EMERSON Bane, NP-C 07/07/2024, 8:00 AM [address], [address] [place], [place] [address] Office [phone] Fax [phone]

## 2024-07-07 NOTE — Telephone Encounter (Signed)
 Pt has been scheduled tele preop appt 07/11/24. Pt states surgeon office waiting on clearance before scheduling surgery.   Med rec and consent are done.     Patient Consent for Virtual Visit        Jose [name] has provided verbal consent on 07/07/2024 for a virtual visit (video or telephone).   CONSENT FOR VIRTUAL VISIT FOR:  Jose [name]  By participating in this virtual visit I agree to the following:  I hereby voluntarily request, consent and authorize [facility] and its employed or contracted physicians, physician assistants, nurse practitioners or other licensed health care professionals (the Practitioner), to provide me with telemedicine health care services (the "Services) as deemed necessary by the treating Practitioner. I acknowledge and consent to receive the Services by the Practitioner via telemedicine. I understand that the telemedicine visit will involve communicating with the Practitioner through live audiovisual communication technology and the disclosure of certain medical information by electronic transmission. I acknowledge that I have been given the opportunity to request an in-person assessment or other available alternative prior to the telemedicine visit and am voluntarily participating in the telemedicine visit.  I understand that I have the right to withhold or withdraw my consent to the use of telemedicine in the course of my care at any time, without affecting my right to future care or treatment, and that the Practitioner or I may terminate the telemedicine visit at any time. I understand that I have the right to inspect all information obtained and/or recorded in the course of the telemedicine visit and may receive copies of available information for a reasonable fee.  I understand that some of the potential risks of receiving the Services via telemedicine include:  Delay or interruption in medical evaluation due to technological equipment failure or  disruption; Information transmitted may not be sufficient (e.g. poor resolution of images) to allow for appropriate medical decision making by the Practitioner; and/or  In rare instances, security protocols could fail, causing a breach of personal health information.  Furthermore, I acknowledge that it is my responsibility to provide information about my medical history, conditions and care that is complete and accurate to the best of my ability. I acknowledge that Practitioner's advice, recommendations, and/or decision may be based on factors not within their control, such as incomplete or inaccurate data provided by me or distortions of diagnostic images or specimens that may result from electronic transmissions. I understand that the practice of medicine is not an exact science and that Practitioner makes no warranties or guarantees regarding treatment outcomes. I acknowledge that a copy of this consent can be made available to me via my patient portal [facility] MyChart), or I can request a printed copy by calling the office of [facility].    I understand that my insurance will be billed for this visit.   I have read or had this consent read to me. I understand the contents of this consent, which adequately explains the benefits and risks of the Services being provided via telemedicine.  I have been provided ample opportunity to ask questions regarding this consent and the Services and have had my questions answered to my satisfaction. I give my informed consent for the services to be provided through the use of telemedicine in my medical care

## 2024-07-07 NOTE — Therapy (Signed)
 OUTPATIENT OCCUPATIONAL THERAPY TREATMENT  NOTE  Patient Name: Jose [name] MRN: [id number] DOB:[birth year], 73 y.o., male Today's Date: 07/07/2024  PCP: Birda Ahumada, MD REFERRING PROVIDER: [name] Reiter, MD     END OF SESSION:  OT End of Session - 07/07/24 1102     Visit Number 13    Number of Visits 20    Date for OT Re-Evaluation 07/25/24    Authorization Type Medicare    OT Start Time 1102    OT Stop Time 1145    OT Time Calculation (min) 43 min    Activity Tolerance Patient tolerated treatment well;No increased pain;Patient limited by fatigue;Patient limited by pain    Behavior During Therapy [facility] for tasks assessed/performed             Past Medical History:  Diagnosis Date   Arthritis    R shoulder, great toes, hands- has gout & has injections in knees with Dr. [name]    Bicuspid aortic valve with ascending aorta 4.0 to 4.5 cm in diameter    CAD (coronary artery disease)    Cancer (HCC)    skin ca-    GERD (gastroesophageal reflux disease)    Headache    History of hiatal hernia    Hypertension    Prostate cancer (HCC)    S/P TAVR (transcatheter aortic valve replacement)    SDH (subdural hematoma) (HCC)    Severe aortic stenosis    Thoracic ascending aortic aneurysm (HCC)    Vertebral artery aneurysm [facility])    Past Surgical History:  Procedure Laterality Date   BRAIN SURGERY  2016   evacuation of SDH   CARDIAC CATHETERIZATION     CARPAL TUNNEL RELEASE Bilateral    embolization of left vertebral artery aneurysm with pipeline/coil  10/31/2022   GREEN LIGHT LASER TURP (TRANSURETHRAL RESECTION OF PROSTATE  04/27/2017   KNEE ARTHROPLASTY     LEFT HEART CATH AND CORONARY ANGIOGRAPHY N/A 10/08/2019   Procedure: LEFT HEART CATH AND CORONARY ANGIOGRAPHY;  Surgeon: Wonda Sharper, MD;  Location: [facility] INVASIVE CV LAB;  Service: Cardiovascular;  Laterality: N/A;   QUADRICEPS TENDON REPAIR Bilateral 06/15/2017   Procedure: REPAIR QUADRICEP TENDON;   Surgeon: Addie Cordella Glendia, MD;  Location: [facility] OR;  Service: Orthopedics;  Laterality: Bilateral;   SHOULDER SURGERY Right    TEE WITHOUT CARDIOVERSION N/A 07/03/2022   Procedure: TRANSESOPHAGEAL ECHOCARDIOGRAM (TEE);  Surgeon: Delford Maude BROCKS, MD;  Location: [facility] ENDOSCOPY;  Service: Cardiovascular;  Laterality: N/A;   TRANSCATHETER AORTIC VALVE REPLACEMENT, TRANSFEMORAL  12/23/2019   TRANSCATHETER AORTIC VALVE REPLACEMENT, TRANSFEMORAL N/A 12/23/2019   Procedure: TRANSCATHETER AORTIC VALVE REPLACEMENT, TRANSFEMORAL;  Surgeon: Wonda Sharper, MD;  Location: [facility] OR;  Service: Open Heart Surgery;  Laterality: N/A;   VASCULAR SURGERY     Patient Active Problem List   Diagnosis Date Noted   Hypertension    Severe aortic stenosis 10/08/2019   Candidiasis    Abnormality of gait    Hypoalbuminemia due to protein-calorie malnutrition [facility])    History of gout    Sleep disturbance    Constipation    Rupture of quadriceps tendon, right, sequela 06/19/2017   Quadriceps tendon rupture, left, sequela 06/19/2017   Acute blood loss anemia 06/19/2017   Fall    History of subdural hematoma    Post-operative pain    Recurrent falls    Quadriceps tendon rupture 06/15/2017   Primary osteoarthritis of both knees 02/28/2017   Primary osteoarthritis of both hands 02/28/2017   Uricacidemia 02/28/2017  Pain in joint of left knee 02/07/2017   Idiopathic chronic gout, unspecified site, without tophus (tophi) 11/29/2016   HEMATURIA UNSPECIFIED 01/25/2009   Abdominal pain 01/25/2009   XERODERMA 03/10/2008   UNS ADVRS EFF UNS RX MEDICINAL&BIOLOGICAL SBSTNC 03/10/2008   ADJUSTMENT REACTION WITH PHYSICAL SYMPTOMS 02/14/2008   MUSCLE SPASM 02/14/2008   ACUTE PROSTATITIS 12/16/2007   BREAST LUMP OR MASS, RIGHT 07/12/2007   H/O: RCT (rotator cuff tear) 01/13/2007   GOUT 01/08/2007    ONSET DATE: ~6 weeks onset pain  REFERRING DIAG:  M25.531 (ICD-10-CM) - Pain in right wrist  M25.521 (ICD-10-CM) - Pain  in right elbow  M65.30 (ICD-10-CM) - Trigger finger, unspecified finger, unspecified laterality    THERAPY DIAG:  Pain in right elbow  Stiffness of right wrist, not elsewhere classified  Muscle weakness (generalized)  Rationale for Evaluation and Treatment: Rehabilitation  PERTINENT HISTORY: having problems with: Rt wrist, Rt elbow and trigger finger  currently seeing PT for shoulder pain and problems on the right side he has pain near the base of the right thumb for about 6 weeks now.  He also has triggering in his fingers 3 and 4 of bilateral hands chronically.  He has received injections to the base of the thumb hand and the wrist to help with his pain.  He is wearing a prefabricated wrist and thumb spica brace today.  He does workout frequently, trying to stay active and he is going to the gym after this, he states   PRECAUTIONS: None  RED FLAGS: None   WEIGHT BEARING RESTRICTIONS: Yes recommended to limit weightbearing through the right wrist and hand to 5 pounds or less for the next few weeks if possible.    SUBJECTIVE:   SUBJECTIVE STATEMENT: He states 70% out of brace now.  He finds some stretches are difficult to do at home himself, but strengthening with therapy putty has gone well     PAIN:  Are you having pain? Yes: NPRS scale: 0/10 at rest now, still goes up to 6/10 at worst in past week, briefly limited.  Pain location: Rt thumb CMC J and MCPJs in  Pain description: Sharp and shooting Aggravating factors: Wrist and thumb motion Relieving factors: Unsure  FALLS: Has patient fallen in last 6 months? No  PLOF: Independent  PATIENT GOALS: To improve pain in the right wrist as well as limit triggering and pain through the fingers and increase functional ability  NEXT MD VISIT: As needed   OBJECTIVE: (All objective assessments below are from initial evaluation on: 05/01/24 unless otherwise specified.)    HAND DOMINANCE: Right   ADLs: Overall ADLs:  States decreased ability to grab, hold household objects, pain and difficulty to open containers, perform FMS tasks (manipulate fasteners on clothing)   FUNCTIONAL OUTCOME MEASURES: 06/10/24: Quick DASH: 22.7%  impairment today    Eval: Quick DASH 34% impairment today  (Higher % Score  =  More Impairment)     UPPER EXTREMITY ROM     Shoulder to Wrist AROM Right eval Left eval Rt 05/07/24 Rt 05/27/24 Rt 06/10/24 Rt 07/01/24 Rt 07/04/24 Rt  07/07/24  Forearm supination 67 pain   77 69     Forearm pronation  85   86 85     Wrist flexion 42 72 32 58 50 59 48 56  Wrist extension 60 69 50 56 56 60 63 56  (Blank rows = not tested)   Hand AROM Right eval Left eval  Full Fist Ability (or  Gap to Distal Palmar Crease) Full fist with notable triggering of digits 4 and 3 Full fist with notable triggering of digits 4 and 3  Thumb Opposition  (Kapandji Scale)  9/10 9/10  (Blank rows = not tested)   UPPER EXTREMITY MMT:      MMT Right 05/01/2024 Rt 07/07/24  Forearm supination 4 -/5 painful 4+/5  Forearm pronation 4/5 4+/5  Wrist flexion 4 -/5 painful 4/5 slight tenderness  Wrist extension 5/5 4+/5  Wrist ulnar deviation 5/5 5/5  Wrist radial deviation 4 -/5 painful 4/5 slightly tender  (Blank rows = not tested)  HAND FUNCTION: 07/04/24: Grip Rt: 80 #   06/24/24: Rt grip : 53.3# no pain    Eval: Observed weakness in affected bilateral hands due to triggering and pain.  Strong grip not advised, but this can be tested in the future if he feels better.   COORDINATION: 07/07/24: 9 Hole Peg Test Right: 25 sec (22 sec is WFL)    OBSERVATIONS:   Eval: He has a positive Finkelstein's test on the right wrist and negative on the left wrist.  He is not tender to the [facility] joint of the right thumb.  Supination is tight and painful near the base of the thumb as well.  Today he presents as deQuervain's Tenosynovitis of the right thumb extensor tendons and triggering (or stenosing tenosynovitis) of  the bilateral middle fingers and ring fingers.    TODAY'S TREATMENT:  07/07/24: He starts with active range of motion for exercise as well as new measures which shows some overall improvement compared to last week.  While he was on moist heat he reviews his new exercises/dynamic stabilization activities were reviewed including thumb press with putty as well as gripping, isometric stabilization activities for the wrist and thumb. Afterward,  OT performs  manual therapy soft tissue mobilization, as this has been helpful to reduce pain, symptoms, lengthen tissues, etc. he states that at home it can be difficult to get a good stretch himself, so he suggests that his significant other, Rose, come in in the next session and she can be educated on how to perform stretches with him.  At the end of the session he was reminded to continue to monitor symptoms throughout the day, monitor tenderness to palpation as well as not trying to perform too many activities or too much cumulative strengthening and motion to the arm which could still cause exacerbation even now.   He will receive a call from the surgeon regarding trigger finger releases which may come soon, and also may give him more time for rest for his de Quervain's tenosynovitis.  OT did start to educate for safety and self-care after that surgery as well, providing a list of recommendations.    Exercises performed and reviewed today: - Forearm Supination Stretch  - 3-4 x daily - 3 reps - 15 sec hold - Wrist Flexion Stretch  - 4 x daily - 3-5 reps - 15 sec hold - Seated Wrist Extension Stretch  - 3-6 x daily - 3-5 reps - 15 hold - Stretch Thumb DOWNWARD  - 2-3 x daily - 3 reps - 15 sec hold - HOOK Stretch  - 4 x daily - 3-5 reps - 15-20 sec hold - Isometric Wrist Extension Pronated  - 2-3 x daily - 4-5 x weekly - 5 reps - 5-10 seconds hold - Seated Isometric Wrist Radial Deviation with Manual Resistance  - 4-6 x daily - 1 sets - 10-15 reps -  Seated  Isometric Wrist Flexion Supinated with Manual Resistance  - 4-6 x daily - 1 sets - 10-15 reps - Full Fist  - 2-3 x daily - 5 reps - 10 sec hold - Thumb Press  - 2-3 x daily - 5 reps     PATIENT EDUCATION: Education details: See tx section above for details  Person educated: Patient Education method: Verbal Instruction, Teach back, Handouts  Education comprehension: States and demonstrates understanding, Additional Education required    HOME EXERCISE PROGRAM: Access Code: [email or web address] URL: [email or web address] Date: 05/01/2024 Prepared by: Melvenia Ada   GOALS: Goals reviewed with patient? Yes   SHORT TERM GOALS: (STG required if POC>30 days) Target Date: 05/09/2024  Pt will obtain protective, custom orthotic. Goal status: 05/05/2024: Met  2.  Pt will demo/state understanding of initial HEP to improve pain levels and prerequisite motion. Goal status: 06/10/24: MET   LONG TERM GOALS: Target Date: 07/25/24  Pt will improve functional ability by decreased impairment per Quick DASH assessment from 34% to 15% or better, for better quality of life. Goal status: 06/10/24: improved to 22.7% now   2.  Pt will improve grip strength in right hand from weak and painful to at least nonpainful 30 lbs for functional use at home and in IADLs. Goal status: 06/10/24: NT due to exacerbation   3.  Pt will improve A/ROM in right wrist flexion from painful 42 degrees to at least nonpainful 60 degrees, to have functional motion for tasks like reach and grasp.  Goal status:06/10/24: improved to 50* now   4.  Pt will improve strength in right wrist radial deviation from painful 4 -/5 MMT to at least 4+/5 MMT to have increased functional ability to carry out selfcare and higher-level homecare tasks with less difficulty. Goal status: 06/10/24: NT due to exacerbation   5. Pt will decrease pain at worst from 10/10 to 4/10 or better to have better sleep and occupational participation in daily  roles. Goal status:06/10/24: improving, but still having exacerbations    ASSESSMENT:  CLINICAL IMPRESSION: 07/07/24: Pain continues to be low at rest, he does still get some sharp spikes of pain but they are infrequent and very brief.  He needs to continue stretching, as he does admit to not stretching is much as moving actively.  Providing some caregiver training in the next session should be useful for that     PLAN:  OT FREQUENCY: 1-2x/week  OT DURATION: 6 additional weeks from 06/13/2024 - 07/25/24 and up to 20 total visits as needed  PLANNED INTERVENTIONS: 97535 self care/ADL training, 02889 therapeutic exercise, 97530 therapeutic activity, 97112 neuromuscular re-education, 97140 manual therapy, 97035 ultrasound, 97760 Orthotic Initial, 97763 Orthotic/Prosthetic subsequent, Dry needling, coping strategies training, patient/family education, and DME and/or AE instructions  RECOMMENDED OTHER SERVICES: In PT for shoulder pain and problems currently  CONSULTED AND AGREED WITH PLAN OF CARE: Patient  PLAN FOR NEXT SESSION:   Provide some brief caregiver training on how to perform stretches, attempt to upgrade strengthening to light: concentric strengthening at the wrist and arm as tolerated.   Melvenia Ada, OTR/L, CHT 07/07/2024, 12:08 PM

## 2024-07-08 NOTE — Therapy (Signed)
 OUTPATIENT OCCUPATIONAL THERAPY TREATMENT  NOTE  Patient Name: Jose [name] MRN: [id number] DOB:[birth year], 73 y.o., male Today's Date: 07/09/2024  PCP: Birda Ahumada, MD REFERRING PROVIDER: [name] Reiter, MD     END OF SESSION:  OT End of Session - 07/09/24 1056     Visit Number 14    Number of Visits 20    Date for OT Re-Evaluation 07/25/24    Authorization Type Medicare    OT Start Time 1058    OT Stop Time 1144    OT Time Calculation (min) 46 min    Activity Tolerance Patient tolerated treatment well;No increased pain;Patient limited by fatigue;Patient limited by pain    Behavior During Therapy [facility] for tasks assessed/performed              Past Medical History:  Diagnosis Date   Arthritis    R shoulder, great toes, hands- has gout & has injections in knees with Dr. [name]    Bicuspid aortic valve with ascending aorta 4.0 to 4.5 cm in diameter    CAD (coronary artery disease)    Cancer (HCC)    skin ca-    GERD (gastroesophageal reflux disease)    Headache    History of hiatal hernia    Hypertension    Prostate cancer (HCC)    S/P TAVR (transcatheter aortic valve replacement)    SDH (subdural hematoma) (HCC)    Severe aortic stenosis    Thoracic ascending aortic aneurysm (HCC)    Vertebral artery aneurysm [facility])    Past Surgical History:  Procedure Laterality Date   BRAIN SURGERY  2016   evacuation of SDH   CARDIAC CATHETERIZATION     CARPAL TUNNEL RELEASE Bilateral    embolization of left vertebral artery aneurysm with pipeline/coil  10/31/2022   GREEN LIGHT LASER TURP (TRANSURETHRAL RESECTION OF PROSTATE  04/27/2017   KNEE ARTHROPLASTY     LEFT HEART CATH AND CORONARY ANGIOGRAPHY N/A 10/08/2019   Procedure: LEFT HEART CATH AND CORONARY ANGIOGRAPHY;  Surgeon: Wonda Sharper, MD;  Location: [facility] INVASIVE CV LAB;  Service: Cardiovascular;  Laterality: N/A;   QUADRICEPS TENDON REPAIR Bilateral 06/15/2017   Procedure: REPAIR QUADRICEP TENDON;   Surgeon: Addie Cordella Glendia, MD;  Location: [facility] OR;  Service: Orthopedics;  Laterality: Bilateral;   SHOULDER SURGERY Right    TEE WITHOUT CARDIOVERSION N/A 07/03/2022   Procedure: TRANSESOPHAGEAL ECHOCARDIOGRAM (TEE);  Surgeon: Delford Maude BROCKS, MD;  Location: [facility] ENDOSCOPY;  Service: Cardiovascular;  Laterality: N/A;   TRANSCATHETER AORTIC VALVE REPLACEMENT, TRANSFEMORAL  12/23/2019   TRANSCATHETER AORTIC VALVE REPLACEMENT, TRANSFEMORAL N/A 12/23/2019   Procedure: TRANSCATHETER AORTIC VALVE REPLACEMENT, TRANSFEMORAL;  Surgeon: Wonda Sharper, MD;  Location: [facility] OR;  Service: Open Heart Surgery;  Laterality: N/A;   VASCULAR SURGERY     Patient Active Problem List   Diagnosis Date Noted   Hypertension    Severe aortic stenosis 10/08/2019   Candidiasis    Abnormality of gait    Hypoalbuminemia due to protein-calorie malnutrition [facility])    History of gout    Sleep disturbance    Constipation    Rupture of quadriceps tendon, right, sequela 06/19/2017   Quadriceps tendon rupture, left, sequela 06/19/2017   Acute blood loss anemia 06/19/2017   Fall    History of subdural hematoma    Post-operative pain    Recurrent falls    Quadriceps tendon rupture 06/15/2017   Primary osteoarthritis of both knees 02/28/2017   Primary osteoarthritis of both hands 02/28/2017   Uricacidemia  02/28/2017   Pain in joint of left knee 02/07/2017   Idiopathic chronic gout, unspecified site, without tophus (tophi) 11/29/2016   HEMATURIA UNSPECIFIED 01/25/2009   Abdominal pain 01/25/2009   XERODERMA 03/10/2008   UNS ADVRS EFF UNS RX MEDICINAL&BIOLOGICAL SBSTNC 03/10/2008   ADJUSTMENT REACTION WITH PHYSICAL SYMPTOMS 02/14/2008   MUSCLE SPASM 02/14/2008   ACUTE PROSTATITIS 12/16/2007   BREAST LUMP OR MASS, RIGHT 07/12/2007   H/O: RCT (rotator cuff tear) 01/13/2007   GOUT 01/08/2007    ONSET DATE: ~6 weeks onset pain  REFERRING DIAG:  M25.531 (ICD-10-CM) - Pain in right wrist  M25.521 (ICD-10-CM) - Pain  in right elbow  M65.30 (ICD-10-CM) - Trigger finger, unspecified finger, unspecified laterality    THERAPY DIAG:  Pain in right elbow  Stiffness of right wrist, not elsewhere classified  Muscle weakness (generalized)  Rationale for Evaluation and Treatment: Rehabilitation  PERTINENT HISTORY: having problems with: Rt wrist, Rt elbow and trigger finger  currently seeing PT for shoulder pain and problems on the right side he has pain near the base of the right thumb for about 6 weeks now.  He also has triggering in his fingers 3 and 4 of bilateral hands chronically.  He has received injections to the base of the thumb hand and the wrist to help with his pain.  He is wearing a prefabricated wrist and thumb spica brace today.  He does workout frequently, trying to stay active and he is going to the gym after this, he states   PRECAUTIONS: None  RED FLAGS: None   WEIGHT BEARING RESTRICTIONS: Yes recommended to limit weightbearing through the right wrist and hand to 5 pounds or less for the next few weeks if possible.    SUBJECTIVE:   SUBJECTIVE STATEMENT: He states doing well, no exacerbation, he brought his significant other to help him do his stretches at home.  He has had a hard time getting a good stretch himself      PAIN:  Are you having pain? Yes: NPRS scale:  0/10 at rest now Pain location: Rt thumb CMC J and MCPJs in  Pain description: Sharp and shooting Aggravating factors: Wrist and thumb motion Relieving factors: Unsure  FALLS: Has patient fallen in last 6 months? No  PLOF: Independent  PATIENT GOALS: To improve pain in the right wrist as well as limit triggering and pain through the fingers and increase functional ability  NEXT MD VISIT: As needed   OBJECTIVE: (All objective assessments below are from initial evaluation on: 05/01/24 unless otherwise specified.)    HAND DOMINANCE: Right   ADLs: Overall ADLs: States decreased ability to grab, hold  household objects, pain and difficulty to open containers, perform FMS tasks (manipulate fasteners on clothing)   FUNCTIONAL OUTCOME MEASURES: 06/10/24: Quick DASH: 22.7%  impairment today    Eval: Quick DASH 34% impairment today  (Higher % Score  =  More Impairment)     UPPER EXTREMITY ROM     Shoulder to Wrist AROM Right eval Left eval Rt 05/07/24 Rt 05/27/24 Rt 06/10/24 Rt 07/01/24 Rt 07/04/24 Rt  07/07/24 Rt 07/14/24  Forearm supination 67 pain   77 69      Forearm pronation  85   86 85      Wrist flexion 42 72 32 58 50 59 48 56   Wrist extension 60 69 50 56 56 60 63 56   (Blank rows = not tested)   Hand AROM Right eval Left eval  Full Fist  Ability (or Gap to Distal Palmar Crease) Full fist with notable triggering of digits 4 and 3 Full fist with notable triggering of digits 4 and 3  Thumb Opposition  (Kapandji Scale)  9/10 9/10  (Blank rows = not tested)   UPPER EXTREMITY MMT:      MMT Right 05/01/2024 Rt 07/07/24  Forearm supination 4 -/5 painful 4+/5  Forearm pronation 4/5 4+/5  Wrist flexion 4 -/5 painful 4/5 slight tenderness  Wrist extension 5/5 4+/5  Wrist ulnar deviation 5/5 5/5  Wrist radial deviation 4 -/5 painful 4/5 slightly tender  (Blank rows = not tested)  HAND FUNCTION: 07/04/24: Grip Rt: 80 #   06/24/24: Rt grip : 53.3# no pain    Eval: Observed weakness in affected bilateral hands due to triggering and pain.  Strong grip not advised, but this can be tested in the future if he feels better.   COORDINATION: 07/07/24: 9 Hole Peg Test Right: 25 sec (22 sec is WFL)    OBSERVATIONS:   Eval: He has a positive Finkelstein's test on the right wrist and negative on the left wrist.  He is not tender to the [facility] joint of the right thumb.  Supination is tight and painful near the base of the thumb as well.  Today he presents as deQuervain's Tenosynovitis of the right thumb extensor tendons and triggering (or stenosing tenosynovitis) of the bilateral middle  fingers and ring fingers.    TODAY'S TREATMENT:  07/09/24:  Today he arrives with his significant other-Rose, and she will be educated on performing stretches with the patient, as he finds difficulty feeling good stretches when self performing.  OT carefully does hand overhand assistance to help her help him, also providing stretches for him in her while educating on how the stretches should feel and proper hand and arm placements.  She states feeling comfortable with the stretches and wrist flexion/extension as well as thumb flexion and wrist ulnar deviation.  They were recommended to still stretch at least 3 times a day.  He should continue light therapy putty activities as well as isometric resistance, and we also trial isotonic resistance today with very light weight.  She states understanding how to provide stretches, he states feeling good stretches and they will be able to support each other at home.    Exercises performed and reviewed today: - Forearm Supination Stretch  - 3-4 x daily - 3 reps - 15 sec hold - Wrist Flexion Stretch  - 4 x daily - 3-5 reps - 15 sec hold - Seated Wrist Extension Stretch  - 3-6 x daily - 3-5 reps - 15 hold - Stretch Thumb DOWNWARD  - 2-3 x daily - 3 reps - 15 sec hold - HOOK Stretch  - 4 x daily - 3-5 reps - 15-20 sec hold - Isometric Wrist Extension Pronated  - 2-3 x daily - 4-5 x weekly - 5 reps - 5-10 seconds hold - Seated Isometric Wrist Radial Deviation with Manual Resistance  - 4-6 x daily - 1 sets - 10-15 reps - Seated Isometric Wrist Flexion Supinated with Manual Resistance  - 4-6 x daily - 1 sets - 10-15 reps - Full Fist  - 2-3 x daily - 5 reps - 10 sec hold - Thumb Press  - 2-3 x daily - 5 reps   PATIENT EDUCATION: Education details: See tx section above for details  Person educated: Patient Education method: Verbal Instruction, Teach back, Handouts  Education comprehension: States and demonstrates understanding,  Additional Education required     HOME EXERCISE PROGRAM: Access Code: [email or web address] URL: [email or web address] Date: 05/01/2024 Prepared by: Melvenia Ada   GOALS: Goals reviewed with patient? Yes   SHORT TERM GOALS: (STG required if POC>30 days) Target Date: 05/09/2024  Pt will obtain protective, custom orthotic. Goal status: 05/05/2024: Met  2.  Pt will demo/state understanding of initial HEP to improve pain levels and prerequisite motion. Goal status: 06/10/24: MET   LONG TERM GOALS: Target Date: 07/25/24  Pt will improve functional ability by decreased impairment per Quick DASH assessment from 34% to 15% or better, for better quality of life. Goal status: 06/10/24: improved to 22.7% now   2.  Pt will improve grip strength in right hand from weak and painful to at least nonpainful 30 lbs for functional use at home and in IADLs. Goal status: 06/10/24: NT due to exacerbation   3.  Pt will improve A/ROM in right wrist flexion from painful 42 degrees to at least nonpainful 60 degrees, to have functional motion for tasks like reach and grasp.  Goal status:06/10/24: improved to 50* now   4.  Pt will improve strength in right wrist radial deviation from painful 4 -/5 MMT to at least 4+/5 MMT to have increased functional ability to carry out selfcare and higher-level homecare tasks with less difficulty. Goal status: 06/10/24: NT due to exacerbation   5. Pt will decrease pain at worst from 10/10 to 4/10 or better to have better sleep and occupational participation in daily roles. Goal status:06/10/24: improving, but still having exacerbations    ASSESSMENT:  CLINICAL IMPRESSION: 07/09/24: They did well with caregiver training and for assisted stretching, he continues to improve strength and vigor without exacerbation of deQuervain's.  Trigger fingers will have surgical release, but he states managing fairly well until that happens   07/07/24: Pain continues to be low at rest, he does still get some sharp  spikes of pain but they are infrequent and very brief.  He needs to continue stretching, as he does admit to not stretching is much as moving actively.  Providing some caregiver training in the next session should be useful for that     PLAN:  OT FREQUENCY: 1-2x/week  OT DURATION: 6 additional weeks from 06/13/2024 - 07/25/24 and up to 20 total visits as needed  PLANNED INTERVENTIONS: 97535 self care/ADL training, 02889 therapeutic exercise, 97530 therapeutic activity, 97112 neuromuscular re-education, 97140 manual therapy, 97035 ultrasound, 97760 Orthotic Initial, 97763 Orthotic/Prosthetic subsequent, Dry needling, coping strategies training, patient/family education, and DME and/or AE instructions  RECOMMENDED OTHER SERVICES: In PT for shoulder pain and problems currently  CONSULTED AND AGREED WITH PLAN OF CARE: Patient  PLAN FOR NEXT SESSION:   Continue on with stretches and strengthening as tolerated, avoiding exacerbation  Floride Hutmacher, OTR/L, CHT 07/09/2024, 11:49 AM

## 2024-07-09 ENCOUNTER — Ambulatory Visit (INDEPENDENT_AMBULATORY_CARE_PROVIDER_SITE_OTHER): Admitting: Rehabilitative and Restorative Service Providers"

## 2024-07-09 ENCOUNTER — Encounter: Payer: Self-pay | Admitting: Rehabilitative and Restorative Service Providers"

## 2024-07-09 DIAGNOSIS — M6281 Muscle weakness (generalized): Secondary | ICD-10-CM | POA: Diagnosis not present

## 2024-07-09 DIAGNOSIS — M25631 Stiffness of right wrist, not elsewhere classified: Secondary | ICD-10-CM

## 2024-07-09 DIAGNOSIS — M25521 Pain in right elbow: Secondary | ICD-10-CM | POA: Diagnosis not present

## 2024-07-10 NOTE — Therapy (Signed)
 OUTPATIENT OCCUPATIONAL THERAPY TREATMENT  NOTE  Patient Name: Jose [name] MRN: [id number] DOB:December 11, 1951, 73 y.o., male Today's Date: 07/14/2024  PCP: Birda Ahumada, MD REFERRING PROVIDER: [name] Reiter, MD     END OF SESSION:  OT End of Session - 07/14/24 1106     Visit Number 15    Number of Visits 20    Date for OT Re-Evaluation 07/25/24    Authorization Type Medicare    OT Start Time 1106    OT Stop Time 1140    OT Time Calculation (min) 34 min    Activity Tolerance Patient tolerated treatment well;No increased pain;Patient limited by fatigue;Patient limited by pain    Behavior During Therapy [facility] for tasks assessed/performed           Past Medical History:  Diagnosis Date   Arthritis    R shoulder, great toes, hands- has gout & has injections in knees with Dr. [name]    Bicuspid aortic valve with ascending aorta 4.0 to 4.5 cm in diameter    CAD (coronary artery disease)    Cancer (HCC)    skin ca-    GERD (gastroesophageal reflux disease)    Headache    History of hiatal hernia    Hypertension    Prostate cancer (HCC)    S/P TAVR (transcatheter aortic valve replacement)    SDH (subdural hematoma) (HCC)    Severe aortic stenosis    Thoracic ascending aortic aneurysm (HCC)    Vertebral artery aneurysm [facility])    Past Surgical History:  Procedure Laterality Date   BRAIN SURGERY  2016   evacuation of SDH   CARDIAC CATHETERIZATION     CARPAL TUNNEL RELEASE Bilateral    embolization of left vertebral artery aneurysm with pipeline/coil  10/31/2022   GREEN LIGHT LASER TURP (TRANSURETHRAL RESECTION OF PROSTATE  04/27/2017   KNEE ARTHROPLASTY     LEFT HEART CATH AND CORONARY ANGIOGRAPHY N/A 10/08/2019   Procedure: LEFT HEART CATH AND CORONARY ANGIOGRAPHY;  Surgeon: Wonda Sharper, MD;  Location: [facility] INVASIVE CV LAB;  Service: Cardiovascular;  Laterality: N/A;   QUADRICEPS TENDON REPAIR Bilateral 06/15/2017   Procedure: REPAIR QUADRICEP TENDON;   Surgeon: Addie Cordella Glendia, MD;  Location: [facility] OR;  Service: Orthopedics;  Laterality: Bilateral;   SHOULDER SURGERY Right    TEE WITHOUT CARDIOVERSION N/A 07/03/2022   Procedure: TRANSESOPHAGEAL ECHOCARDIOGRAM (TEE);  Surgeon: Delford Maude BROCKS, MD;  Location: [facility] ENDOSCOPY;  Service: Cardiovascular;  Laterality: N/A;   TRANSCATHETER AORTIC VALVE REPLACEMENT, TRANSFEMORAL  12/23/2019   TRANSCATHETER AORTIC VALVE REPLACEMENT, TRANSFEMORAL N/A 12/23/2019   Procedure: TRANSCATHETER AORTIC VALVE REPLACEMENT, TRANSFEMORAL;  Surgeon: Wonda Sharper, MD;  Location: [facility] OR;  Service: Open Heart Surgery;  Laterality: N/A;   VASCULAR SURGERY     Patient Active Problem List   Diagnosis Date Noted   Hypertension    Severe aortic stenosis 10/08/2019   Candidiasis    Abnormality of gait    Hypoalbuminemia due to protein-calorie malnutrition [facility])    History of gout    Sleep disturbance    Constipation    Rupture of quadriceps tendon, right, sequela 06/19/2017   Quadriceps tendon rupture, left, sequela 06/19/2017   Acute blood loss anemia 06/19/2017   Fall    History of subdural hematoma    Post-operative pain    Recurrent falls    Quadriceps tendon rupture 06/15/2017   Primary osteoarthritis of both knees 02/28/2017   Primary osteoarthritis of both hands 02/28/2017   Uricacidemia 02/28/2017  Pain in joint of left knee 02/07/2017   Idiopathic chronic gout, unspecified site, without tophus (tophi) 11/29/2016   HEMATURIA UNSPECIFIED 01/25/2009   Abdominal pain 01/25/2009   XERODERMA 03/10/2008   UNS ADVRS EFF UNS RX MEDICINAL&BIOLOGICAL SBSTNC 03/10/2008   ADJUSTMENT REACTION WITH PHYSICAL SYMPTOMS 02/14/2008   MUSCLE SPASM 02/14/2008   ACUTE PROSTATITIS 12/16/2007   BREAST LUMP OR MASS, RIGHT 07/12/2007   H/O: RCT (rotator cuff tear) 01/13/2007   GOUT 01/08/2007    ONSET DATE: ~6 weeks onset pain  REFERRING DIAG:  M25.531 (ICD-10-CM) - Pain in right wrist  M25.521 (ICD-10-CM) - Pain  in right elbow  M65.30 (ICD-10-CM) - Trigger finger, unspecified finger, unspecified laterality    THERAPY DIAG:  Pain in right elbow  Stiffness of right wrist, not elsewhere classified  Muscle weakness (generalized)  Rationale for Evaluation and Treatment: Rehabilitation  PERTINENT HISTORY: having problems with: Rt wrist, Rt elbow and trigger finger  currently seeing PT for shoulder pain and problems on the right side he has pain near the base of the right thumb for about 6 weeks now.  He also has triggering in his fingers 3 and 4 of bilateral hands chronically.  He has received injections to the base of the thumb hand and the wrist to help with his pain.  He is wearing a prefabricated wrist and thumb spica brace today.  He does workout frequently, trying to stay active and he is going to the gym after this, he states   PRECAUTIONS: None  RED FLAGS: None   WEIGHT BEARING RESTRICTIONS: Yes recommended to limit weightbearing through the right wrist and hand to 5 pounds or less for the next few weeks if possible.    SUBJECTIVE:   SUBJECTIVE STATEMENT: He states having a slight exacerbation of pain today which may have been from organizing his office recently.  He has a compressive wrist wrap but was choosing not to wear it at that time.  He is asked to please wear something when weaning out of his rigid orthosis.    PAIN:  Are you having pain? Yes: NPRS scale:   2-3/10 at rest now Pain location: Rt thumb CMC J and MCPJs in  Pain description: Sharp and shooting Aggravating factors: Wrist and thumb motion Relieving factors: Unsure  FALLS: Has patient fallen in last 6 months? No  PLOF: Independent  PATIENT GOALS: To improve pain in the right wrist as well as limit triggering and pain through the fingers and increase functional ability  NEXT MD VISIT: As needed   OBJECTIVE: (All objective assessments below are from initial evaluation on: 05/01/24 unless otherwise  specified.)    HAND DOMINANCE: Right   ADLs: Overall ADLs: States decreased ability to grab, hold household objects, pain and difficulty to open containers, perform FMS tasks (manipulate fasteners on clothing)   FUNCTIONAL OUTCOME MEASURES: 06/10/24: Quick DASH: 22.7%  impairment today    Eval: Quick DASH 34% impairment today  (Higher % Score  =  More Impairment)     UPPER EXTREMITY ROM     Shoulder to Wrist AROM Right eval Left eval Rt 05/07/24 Rt 05/27/24 Rt 06/10/24 Rt 07/01/24 Rt 07/04/24 Rt  07/07/24 Rt 07/14/24  Forearm supination 67 pain   77 69      Forearm pronation  85   86 85      Wrist flexion 42 72 32 58 50 59 48 56 58  Wrist extension 60 69 50 56 56 60 63 56 64  (Blank  rows = not tested)   Hand AROM Right eval Left eval  Full Fist Ability (or Gap to Distal Palmar Crease) Full fist with notable triggering of digits 4 and 3 Full fist with notable triggering of digits 4 and 3  Thumb Opposition  (Kapandji Scale)  9/10 9/10  (Blank rows = not tested)   UPPER EXTREMITY MMT:      MMT Right 05/01/2024 Rt 07/07/24  Forearm supination 4 -/5 painful 4+/5  Forearm pronation 4/5 4+/5  Wrist flexion 4 -/5 painful 4/5 slight tenderness  Wrist extension 5/5 4+/5  Wrist ulnar deviation 5/5 5/5  Wrist radial deviation 4 -/5 painful 4/5 slightly tender  (Blank rows = not tested)  HAND FUNCTION: 07/04/24: Grip Rt: 80 #   06/24/24: Rt grip : 53.3# no pain    Eval: Observed weakness in affected bilateral hands due to triggering and pain.  Strong grip not advised, but this can be tested in the future if he feels better.   COORDINATION: 07/07/24: 9 Hole Peg Test Right: 25 sec (22 sec is WFL)    OBSERVATIONS:   Eval: He has a positive Finkelstein's test on the right wrist and negative on the left wrist.  He is not tender to the [facility] joint of the right thumb.  Supination is tight and painful near the base of the thumb as well.  Today he presents as deQuervain's  Tenosynovitis of the right thumb extensor tendons and triggering (or stenosing tenosynovitis) of the bilateral middle fingers and ring fingers.    TODAY'S TREATMENT:  07/14/24: Active range of motion exercises actually yields improving measurements today which is hopeful despite having slight exacerbation of pain.  He is advised for his safety to avoid strengthening portions when he has an exacerbation like this, focus on heat, self massage, stretching etc.  This education took place while he was on moist heat for approximately 4 minutes.  Afterwards, OT does manual therapy with vibration around the sore first dorsal compartment as well as percussion through the tight extensors to the forearm.  OT then perform stretches with him, reviewing these.  He was asked how caregiver assisted stretches are going at home, and he states these are going fairly well after the training session that we had last time.  OT tells him that if he feels better in the next 2 to 3 days he can definitely start doing light strengthening again and work back into this.    He has no pain at the end of the session, and is given the surgery schedulers phone number to contact about trigger finger release as this is not happened yet.     PATIENT EDUCATION: Education details: See tx section above for details  Person educated: Patient Education method: Verbal Instruction, Teach back, Handouts  Education comprehension: States and demonstrates understanding, Additional Education required    HOME EXERCISE PROGRAM: Access Code: [email or web address] URL: [email or web address] Date: 05/01/2024 Prepared by: Melvenia Ada   GOALS: Goals reviewed with patient? Yes   SHORT TERM GOALS: (STG required if POC>30 days) Target Date: 05/09/2024  Pt will obtain protective, custom orthotic. Goal status: 05/05/2024: Met  2.  Pt will demo/state understanding of initial HEP to improve pain levels and prerequisite motion. Goal status:  06/10/24: MET   LONG TERM GOALS: Target Date: 07/25/24  Pt will improve functional ability by decreased impairment per Quick DASH assessment from 34% to 15% or better, for better quality of life. Goal status: 06/10/24: improved to 22.7% now   2.  Pt will improve grip strength in right hand from weak and painful to at least nonpainful 30 lbs for functional use at home and in IADLs. Goal status: 06/10/24: NT due to exacerbation   3.  Pt will improve A/ROM in right wrist flexion from painful 42 degrees to at least nonpainful 60 degrees, to have functional motion for tasks like reach and grasp.  Goal status:06/10/24: improved to 50* now   4.  Pt will improve strength in right wrist radial deviation from painful 4 -/5 MMT to at least 4+/5 MMT to have increased functional ability to carry out selfcare and higher-level homecare tasks with less difficulty. Goal status: 06/10/24: NT due to exacerbation   5. Pt will decrease pain at worst from 10/10 to 4/10 or better to have better sleep and occupational participation in daily roles. Goal status:06/10/24: improving, but still having exacerbations    ASSESSMENT:  CLINICAL IMPRESSION: 07/14/24: Slight exacerbation was slightly concerning today, however motion is still doing well, he needs to be using his other brace when out of the big brace as long as he is being very active.  OT is still hopeful that postop will yield a time for him to rest and continue healing for the thumb and wrist as well.   07/09/24: They did well with caregiver training and for assisted stretching, he continues to improve strength and vigor without exacerbation of deQuervain's.  Trigger fingers will have surgical release, but he states managing fairly well until that happens   07/07/24: Pain continues to be low at rest, he does still get some sharp spikes of pain but they are infrequent and very brief.  He needs to continue stretching, as he does admit to not stretching is much as  moving actively.  Providing some caregiver training in the next session should be useful for that     PLAN:  OT FREQUENCY: 1-2x/week  OT DURATION: 6 additional weeks from 06/13/2024 - 07/25/24 and up to 20 total visits as needed  PLANNED INTERVENTIONS: 97535 self care/ADL training, 02889 therapeutic exercise, 97530 therapeutic activity, 97112 neuromuscular re-education, 97140 manual therapy, 97035 ultrasound, 97760 Orthotic Initial, 97763 Orthotic/Prosthetic subsequent, Dry needling, coping strategies training, patient/family education, and DME and/or AE instructions  RECOMMENDED OTHER SERVICES: In PT for shoulder pain and problems currently  CONSULTED AND AGREED WITH PLAN OF CARE: Patient  PLAN FOR NEXT SESSION:   Continue on and hopefully get back into some strengthening he will need a status check in a progress note next session.   Melvenia Ada, OTR/L, CHT 07/14/2024, 1:06 PM

## 2024-07-11 ENCOUNTER — Ambulatory Visit: Attending: Internal Medicine

## 2024-07-11 DIAGNOSIS — Z01810 Encounter for preprocedural cardiovascular examination: Secondary | ICD-10-CM | POA: Diagnosis present

## 2024-07-11 NOTE — Progress Notes (Signed)
 Virtual Visit via Telephone Note   Because of Audley A Cowdery co-morbid illnesses, he is at least at moderate risk for complications without adequate follow up.  This format is felt to be most appropriate for this patient at this time.  Due to technical limitations with video connection (technology), today's appointment will be conducted as an audio only telehealth visit, and Jose [name] verbally agreed to proceed in this manner.   All issues noted in this document were discussed and addressed.  No physical exam could be performed with this format.  Evaluation Performed:  Preoperative cardiovascular risk assessment _____________   Date:  07/11/2024   Patient ID:  Jose [name], DOB 1951/09/05, MRN [id number] Patient Location:  Home Provider location:   Office  Primary Care Provider:  Birda Ahumada, MD Primary Cardiologist:  Maude Emmer, MD  Chief Complaint / Patient Profile   73 y.o. y/o male with a h/o GERD, HTN, HLD, spontaneous CNS bleed with craniotomy in 2016 when living in [place] , bicuspid AV with severe AS and dilated aorta 4.3 cm, history of TAVR with 26 mm Edwards SAPIEN valve 12/23/2019 who is pending right ring and middle trigger digit release and presents today for telephonic preoperative cardiovascular risk assessment.  History of Present Illness    Jose [name] is a 73 y.o. male who presents via audio/video conferencing for a telehealth visit today.  Pt was last seen in cardiology clinic on 09/28/23 by Dr. Emmer.  At that time Jose [name] was doing well other than his usual issues with knee/shoulder pain and gout, arthritis, and neuropathy.  The patient is now pending procedure as outlined above. Since his last visit, he has overall been doing well.   He tore both quadricepts and had them fixe so this limits his activity.  No new issues with his heart.    Per office protocol, he may hold Eliquis  for 2 days prior to procedure and should resume as soon as  hemodynamically stable postoperatively.   Per office protocol he may hold aspirin  for 5-7 days prior to procedure and should resume as soon as hemodynamically stable postoperatively.    Past Medical History    Past Medical History:  Diagnosis Date   Arthritis    R shoulder, great toes, hands- has gout & has injections in knees with Dr. [name]    Bicuspid aortic valve with ascending aorta 4.0 to 4.5 cm in diameter    CAD (coronary artery disease)    Cancer (HCC)    skin ca-    GERD (gastroesophageal reflux disease)    Headache    History of hiatal hernia    Hypertension    Prostate cancer (HCC)    S/P TAVR (transcatheter aortic valve replacement)    SDH (subdural hematoma) (HCC)    Severe aortic stenosis    Thoracic ascending aortic aneurysm (HCC)    Vertebral artery aneurysm [name])    Past Surgical History:  Procedure Laterality Date   BRAIN SURGERY  2016   evacuation of SDH   CARDIAC CATHETERIZATION     CARPAL TUNNEL RELEASE Bilateral    embolization of left vertebral artery aneurysm with pipeline/coil  10/31/2022   GREEN LIGHT LASER TURP (TRANSURETHRAL RESECTION OF PROSTATE  04/27/2017   KNEE ARTHROPLASTY     LEFT HEART CATH AND CORONARY ANGIOGRAPHY N/A 10/08/2019   Procedure: LEFT HEART CATH AND CORONARY ANGIOGRAPHY;  Surgeon: Wonda Sharper, MD;  Location: [facility] INVASIVE CV LAB;  Service: Cardiovascular;  Laterality: N/A;  QUADRICEPS TENDON REPAIR Bilateral 06/15/2017   Procedure: REPAIR QUADRICEP TENDON;  Surgeon: Addie Cordella Hamilton, MD;  Location: [facility] OR;  Service: Orthopedics;  Laterality: Bilateral;   SHOULDER SURGERY Right    TEE WITHOUT CARDIOVERSION N/A 07/03/2022   Procedure: TRANSESOPHAGEAL ECHOCARDIOGRAM (TEE);  Surgeon: Delford Maude BROCKS, MD;  Location: [facility] ENDOSCOPY;  Service: Cardiovascular;  Laterality: N/A;   TRANSCATHETER AORTIC VALVE REPLACEMENT, TRANSFEMORAL  12/23/2019   TRANSCATHETER AORTIC VALVE REPLACEMENT, TRANSFEMORAL N/A 12/23/2019   Procedure:  TRANSCATHETER AORTIC VALVE REPLACEMENT, TRANSFEMORAL;  Surgeon: Wonda Sharper, MD;  Location: [name] OR;  Service: Open Heart Surgery;  Laterality: N/A;   VASCULAR SURGERY      Allergies  Allergies  Allergen Reactions   Ciprofloxacin Other (See Comments)    Patient states instructed not to take due to history of Aneurysms   Quinolones Other (See Comments)    Patient was warned about not using Cipro and similar antibiotics.  Recent studies have raised concern that fluoroquinolone antibiotics could be associated with an increased risk of aortic aneurysm  Fluoroquinolones have non-antimicrobial properties that might jeopardise the integrity of the extracellular matrix of the vascular wall  In a  propensity score matched cohort study in [place], there was a 66% increased rate of aortic aneurysm or dissection associated with oral fluoroquinolone use, compared wit  Blood clots and tendon tears  Patient was warned about not using Cipro and similar antibiotics. Recent studies have raised concern that fluoroquinolone antibiotics could be associated with an increased risk of aortic aneurysm Fluoroquinolones have non-antimicrobial properties that might jeopardise the integrity of the extracellular matrix of the vascular wall In a  propensity score matched cohort study in [place], there was a 66% increased rate of aortic aneurysm or dissection associated with oral fluoroquinolone use, compared wit    Patient states instructed not to take due to history of Aneurysms    Patient states instructed not to take due to history of Aneurysms Patient was warned about not using Cipro and similar antibiotics. Recent studies have raised concern that fluoroquinolone antibiotics could be associated with an increased risk of aortic aneurysm Fluoroquinolones have non-antimicrobial properties that might jeopardise the integrity of the extracellular matrix of the vascular wall In a  propensity score matched cohort study in  [place], there was a 66% increased rate of aortic aneurysm or dissection associated with oral fluoroquinolone use, compared wit Patient states instructed not to take due to history of Aneurysms    Home Medications    Prior to Admission medications   Medication Sig Start Date End Date Taking? Authorizing Provider  allopurinol  (ZYLOPRIM ) 300 MG tablet Take 1 tablet (300 mg total) by mouth daily. 06/25/24   Jose Waddell HERO, PA-C  amoxicillin  (AMOXIL ) 500 MG capsule amoxicillin  500 mg capsule  TAKE 4 CAPSULES BY MOUTH 1 HOUR BEFORE PROCEDURE Patient taking differently: Take 2,000 mg by mouth as directed. PT TAKES 1 HOUR PRIOR TO DENTAL PROCEDURE 03/03/22   [provider]  apixaban  (ELIQUIS ) 5 MG TABS tablet Take 1 tablet (5 mg total) by mouth 2 (two) times daily. 09/28/23   Delford Maude BROCKS, MD  aspirin  81 MG chewable tablet Chew 1 tablet (81 mg total) by mouth daily. 12/25/19   Barrett, Shona MATSU, PA-C  atorvastatin  (LIPITOR) 10 MG tablet     [provider]  clobetasol (TEMOVATE) 0.05 % external solution Apply topically.    [provider]  Colchicine  (MITIGARE ) 0.6 MG CAPS Take 1 capsule by mouth daily as needed (for gout  flares). *Please note grant information for Medicare and Mitigare  copay assistance* 11/19/20   [name] Reiter, MD  esomeprazole  (NEXIUM ) 20 MG capsule Take 20 mg by mouth daily at 12 noon.    [provider]  famotidine (PEPCID) 40 MG tablet Take 40 mg by mouth 2 (two) times daily. 04/04/24   [provider]  ketoconazole (NIZORAL) 2 % shampoo Apply 1 Application topically 2 (two) times a week.    [provider]  losartan  (COZAAR ) 25 MG tablet Take 1 tablet (25 mg total) by mouth daily. 06/17/24   Delford Maude BROCKS, MD  metoprolol  succinate (TOPROL -XL) 50 MG 24 hr tablet Take 1 tablet (50 mg total) by mouth daily. Take with or immediately following a meal. 06/17/24   Nishan, Peter C, MD  Multiple Vitamins-Minerals (MENS MULTIVITAMIN PO)  Take 1 tablet by mouth daily.    [provider]  rosuvastatin  (CRESTOR ) 40 MG tablet Take 1 tablet (40 mg total) by mouth daily. 09/28/23   Nishan, Peter C, MD  tamsulosin (FLOMAX) 0.4 MG CAPS capsule Take 0.4 mg by mouth daily. 08/03/23   [provider]  traMADol  (ULTRAM ) 50 MG tablet Take 1 tablet (50 mg total) by mouth every 8 (eight) hours as needed. Patient not taking: Reported on 07/07/2024 08/06/23   Brooks, Dana, DO  traMADol  (ULTRAM ) 50 MG tablet TAKE 1 TABLET BY MOUTH EVERY 12 HOURS AS NEEDED. Patient not taking: Reported on 07/07/2024 05/02/24   Addie Cordella Hamilton, MD    Physical Exam    Vital Signs:  Ashten A Wortmann does not have vital signs available for review today.  121/72 BP  Given telephonic nature of communication, physical exam is limited. AAOx3. NAD. Normal affect.  Speech and respirations are unlabored.  Accessory Clinical Findings    None  Assessment & Plan    1.  Preoperative Cardiovascular Risk Assessment:  Mr. Pollak's perioperative risk of a major cardiac event is 0.9% according to the Revised Cardiac Risk Index (RCRI).  Therefore, he is at low risk for perioperative complications.   His functional capacity is good at 5.62 METs according to the Duke Activity Status Index (DASI). Recommendations: According to ACC/AHA guidelines, no further cardiovascular testing needed.  The patient may proceed to surgery at acceptable risk.   Antiplatelet and/or Anticoagulation Recommendations: Patient may hold aspirin  for 5 to 7 days prior to procedure.  Please resume when medically safe to do so. Eliquis  (Apixaban ) can be held for 2 days prior to surgery.  Please resume post op when felt to be safe.     The patient was advised that if he develops new symptoms prior to surgery to contact our office to arrange for a follow-up visit, and he verbalized understanding.   A copy of this note will be routed to requesting surgeon.  Time:   Today, I have  spent 10 minutes with the patient with telehealth technology discussing medical history, symptoms, and management plan.     Jose LOISE Fabry, PA-C  07/11/2024, 8:10 AM

## 2024-07-14 ENCOUNTER — Telehealth: Payer: Self-pay

## 2024-07-14 ENCOUNTER — Encounter: Payer: Self-pay | Admitting: Rehabilitative and Restorative Service Providers"

## 2024-07-14 ENCOUNTER — Ambulatory Visit (INDEPENDENT_AMBULATORY_CARE_PROVIDER_SITE_OTHER): Admitting: Rehabilitative and Restorative Service Providers"

## 2024-07-14 DIAGNOSIS — M25631 Stiffness of right wrist, not elsewhere classified: Secondary | ICD-10-CM

## 2024-07-14 DIAGNOSIS — M6281 Muscle weakness (generalized): Secondary | ICD-10-CM | POA: Diagnosis not present

## 2024-07-14 DIAGNOSIS — M25521 Pain in right elbow: Secondary | ICD-10-CM

## 2024-07-14 NOTE — Telephone Encounter (Signed)
 I spoke with the patient today and advised him we have been waiting on a Cardiac clearance to schedule his surgery. Patient just had the Cardiac appointment on Friday 7/25. The patient has been scheduled for surgery on 07/24/24.

## 2024-07-14 NOTE — Telephone Encounter (Signed)
-----   Message from [facility] sent at 07/05/2024  9:12 AM EDT ----- Thanks Spurgeon, I'll make sure we reach out   [facility], can we make sure we contact Courtez to get his trigger releases scheduled.  I believe we made a sheet but we can redo it Monday if needed ----- Message ----- From: Georgina Limes, OT Sent: 07/04/2024  12:44 PM EDT To: Gildardo Alderton, MD  Hey Doc,   Arek would like to discuss/schedule trigger release with you... he hasn't gotten a call yet.   I told him I would reach out to you.     He is doing very well with therapy for deQuervain's and I hope in 2-3 weeks, we are finished managing that... but I did tell him that TFR is the best option for long-term management of the finger issues.   I haven't noticed any thumb OA symptoms, but it may be masked by D.Q.T.  Thanks

## 2024-07-14 NOTE — Telephone Encounter (Signed)
 Do you have a sx sheet for this pt? He was telling nate last week he hasn't been called yet

## 2024-07-16 ENCOUNTER — Encounter: Admitting: Rehabilitative and Restorative Service Providers"

## 2024-07-17 NOTE — Therapy (Signed)
 OUTPATIENT OCCUPATIONAL THERAPY TREATMENT & DISCHARGE NOTE  Patient Name: Jose [name] MRN: [id number] DOB:[birth year], 73 y.o., male Today's Date: 07/22/2024  PCP: Birda Ahumada, MD REFERRING PROVIDER: [name] Reiter, MD              OCCUPATIONAL THERAPY DISCHARGE SUMMARY  Visits from Start of Care: 52  Current functional level related to goals / functional outcomes: 07/21/24: He is now met 4 out of 5 of his long-term goals and will discharge ahead of his upcoming trigger finger release surgery to the right hand.  His lingering tenosynovitis is under control for now, and 4 weeks of rest after surgery will also help this rest.  He was carefully educated on how to move forward after surgery and how to also resume light stretches, light strengthening, light activities within 3 to 4 weeks postop.  He states understanding all directions, will try to manage these things on his own from here forward.  He does note that he can return with a new order in the future as needed for any therapeutic support.  Education / Equipment: Pt has all needed materials and education. Pt understands how to continue on with self-management. See tx notes for more details.   Patient agrees to discharge due to max benefits received from outpatient occupational therapy / hand therapy at this time.   Melvenia Ada, OTR/L, CHT 07/21/24                   END OF SESSION:  OT End of Session - 07/21/24 1103     Visit Number 16    Number of Visits 20    Date for OT Re-Evaluation 07/25/24    Authorization Type Medicare    OT Start Time 1103    OT Stop Time 1149    OT Time Calculation (min) 46 min    Activity Tolerance Patient tolerated treatment well;No increased pain;Patient limited by fatigue;Patient limited by pain    Behavior During Therapy [facility] for tasks assessed/performed            Past Medical History:  Diagnosis Date   Arthritis    R shoulder, great toes, hands- has  gout & has injections in knees with Dr. [name]    Bicuspid aortic valve with ascending aorta 4.0 to 4.5 cm in diameter    CAD (coronary artery disease)    Cancer (HCC)    skin ca-    GERD (gastroesophageal reflux disease)    Headache    History of hiatal hernia    Hypertension    Prostate cancer (HCC)    S/P TAVR (transcatheter aortic valve replacement)    SDH (subdural hematoma) (HCC)    Severe aortic stenosis    Thoracic ascending aortic aneurysm (HCC)    Vertebral artery aneurysm [facility])    Past Surgical History:  Procedure Laterality Date   BRAIN SURGERY  2016   evacuation of SDH   CARDIAC CATHETERIZATION     CARPAL TUNNEL RELEASE Bilateral    embolization of left vertebral artery aneurysm with pipeline/coil  10/31/2022   GREEN LIGHT LASER TURP (TRANSURETHRAL RESECTION OF PROSTATE  04/27/2017   KNEE ARTHROPLASTY     LEFT HEART CATH AND CORONARY ANGIOGRAPHY N/A 10/08/2019   Procedure: LEFT HEART CATH AND CORONARY ANGIOGRAPHY;  Surgeon: Wonda Sharper, MD;  Location: [facility] INVASIVE CV LAB;  Service: Cardiovascular;  Laterality: N/A;   QUADRICEPS TENDON REPAIR Bilateral 06/15/2017   Procedure: REPAIR QUADRICEP TENDON;  Surgeon: Addie Cordella Glendia, MD;  Location:  [facility] OR;  Service: Orthopedics;  Laterality: Bilateral;   SHOULDER SURGERY Right    TEE WITHOUT CARDIOVERSION N/A 07/03/2022   Procedure: TRANSESOPHAGEAL ECHOCARDIOGRAM (TEE);  Surgeon: Delford Maude BROCKS, MD;  Location: [facility] ENDOSCOPY;  Service: Cardiovascular;  Laterality: N/A;   TRANSCATHETER AORTIC VALVE REPLACEMENT, TRANSFEMORAL  12/23/2019   TRANSCATHETER AORTIC VALVE REPLACEMENT, TRANSFEMORAL N/A 12/23/2019   Procedure: TRANSCATHETER AORTIC VALVE REPLACEMENT, TRANSFEMORAL;  Surgeon: Wonda Sharper, MD;  Location: [facility] OR;  Service: Open Heart Surgery;  Laterality: N/A;   VASCULAR SURGERY     Patient Active Problem List   Diagnosis Date Noted   Hypertension    Severe aortic stenosis 10/08/2019   Candidiasis     Abnormality of gait    Hypoalbuminemia due to protein-calorie malnutrition (HCC)    History of gout    Sleep disturbance    Constipation    Rupture of quadriceps tendon, right, sequela 06/19/2017   Quadriceps tendon rupture, left, sequela 06/19/2017   Acute blood loss anemia 06/19/2017   Fall    History of subdural hematoma    Post-operative pain    Recurrent falls    Quadriceps tendon rupture 06/15/2017   Primary osteoarthritis of both knees 02/28/2017   Primary osteoarthritis of both hands 02/28/2017   Uricacidemia 02/28/2017   Pain in joint of left knee 02/07/2017   Idiopathic chronic gout, unspecified site, without tophus (tophi) 11/29/2016   HEMATURIA UNSPECIFIED 01/25/2009   Abdominal pain 01/25/2009   XERODERMA 03/10/2008   UNS ADVRS EFF UNS RX MEDICINAL&BIOLOGICAL SBSTNC 03/10/2008   ADJUSTMENT REACTION WITH PHYSICAL SYMPTOMS 02/14/2008   MUSCLE SPASM 02/14/2008   ACUTE PROSTATITIS 12/16/2007   BREAST LUMP OR MASS, RIGHT 07/12/2007   H/O: RCT (rotator cuff tear) 01/13/2007   GOUT 01/08/2007    ONSET DATE: ~6 weeks onset pain  REFERRING DIAG:  M25.531 (ICD-10-CM) - Pain in right wrist  M25.521 (ICD-10-CM) - Pain in right elbow  M65.30 (ICD-10-CM) - Trigger finger, unspecified finger, unspecified laterality    THERAPY DIAG:  Pain in right elbow  Stiffness of right wrist, not elsewhere classified  Muscle weakness (generalized)  Rationale for Evaluation and Treatment: Rehabilitation  PERTINENT HISTORY: having problems with: Rt wrist, Rt elbow and trigger finger  currently seeing PT for shoulder pain and problems on the right side he has pain near the base of the right thumb for about 6 weeks now.  He also has triggering in his fingers 3 and 4 of bilateral hands chronically.  He has received injections to the base of the thumb hand and the wrist to help with his pain.  He is wearing a prefabricated wrist and thumb spica brace today.  He does workout frequently,  trying to stay active and he is going to the gym after this, he states   PRECAUTIONS: None  RED FLAGS: None   WEIGHT BEARING RESTRICTIONS: Yes recommended to limit weightbearing through the right wrist and hand to 5 pounds or less for the next few weeks if possible.    SUBJECTIVE:   SUBJECTIVE STATEMENT: He states out of brace 75% of the time now and sx planned this Thursday for right hand trigger finger releases.  He states that he would be comfortable discharging therapy today and self-managing that/returning in the future as needed.    PAIN:  Are you having pain?  None now or significantly in the past week aside from triggering painful fingers  FALLS: Has patient fallen in last 6 months? No  PLOF: Independent  PATIENT GOALS:  To improve pain in the right wrist as well as limit triggering and pain through the fingers and increase functional ability  NEXT MD VISIT: As needed   OBJECTIVE: (All objective assessments below are from initial evaluation on: 05/01/24 unless otherwise specified.)    HAND DOMINANCE: Right   ADLs: Overall ADLs: States improving ability to open jars and containers-only having an 18% impairment rating now (down from 34% initially)   FUNCTIONAL OUTCOME MEASURES: 07/21/24: Quick DASH: 18% impairments today   06/10/24: Quick DASH: 22.7%  impairment today    Eval: Quick DASH 34% impairment today  (Higher % Score  =  More Impairment)     UPPER EXTREMITY ROM     Shoulder to Wrist AROM Right eval Left eval Rt 07/21/24  Forearm supination 67 pain  80  Forearm pronation  85  83  Wrist flexion 42 72 49  Wrist extension 60 69 51  (Blank rows = not tested)   Hand AROM Right eval Left eval  Full Fist Ability (or Gap to Distal Palmar Crease) Full fist with notable triggering of digits 4 and 3 Full fist with notable triggering of digits 4 and 3  Thumb Opposition  (Kapandji Scale)  9/10 9/10  (Blank rows = not tested)   UPPER EXTREMITY MMT:       MMT Right 05/01/2024 Rt 07/07/24 Rt 07/21/24  Forearm supination 4 -/5 painful 4+/5 4+/ 5  Forearm pronation 4/5 4+/5 5/ 5  Wrist flexion 4 -/5 painful 4/5 slight tenderness 4+/ 5  Wrist extension 5/5 4+/5 5/ 5  Wrist ulnar deviation 5/5 5/5 5/ 5  Wrist radial deviation 4 -/5 painful 4/5 slightly tender 5/ 5  (Blank rows = not tested)  HAND FUNCTION: 07/21/24: Grip: Rt: 76#    07/04/24: Grip Rt: 80 #   06/24/24: Rt grip : 53.3# no pain    COORDINATION: 07/07/24: 9 Hole Peg Test Right: 25 sec (22 sec is WFL)    OBSERVATIONS:   Eval: He has a positive Finkelstein's test on the right wrist and negative on the left wrist.  He is not tender to the [facility] joint of the right thumb.  Supination is tight and painful near the base of the thumb as well.  Today he presents as deQuervain's Tenosynovitis of the right thumb extensor tendons and triggering (or stenosing tenosynovitis) of the bilateral middle fingers and ring fingers.    TODAY'S TREATMENT:  07/21/24: Today he performs active range of motion, strength against therapist resistance, gripping activities, etc. for exercise as well as activity and for measurement of progress.  Additionally, we discussed home and functional activities with which he is doing much better and rates very little impairment now, and we also discussed continued safety and self-care not only for his de Quervain's tenosynovitis which has been improving, but also for his upcoming surgery for trigger finger releases for digits 3 and 4 of the right hand.  Again we discussed preop and postop care including wound care, exercises that should be done, avoiding weightbearing and repetitive activities for approximately 4 weeks.  This will also be time to help his tenosynovitis improve.  OT goes on to explain and discuss how to get into progressive resistance training carefully after 4 weeks for both the hand and the tenosynovitis.  OT emails these recommendations to him including  exercise progressions as well as postop care.  He states understanding all directions, exercises, safety concerns, and request discharge from therapy so that he can self manage these  things after his upcoming surgery and carry out this plan of care.  OT is in agreement but does state that he can return with a new order as needed in the future if self-management of any of these issues is too difficult.    Exercises reviewed today: Acute post-op HEP:  - Bend and Pull Back Wrist SLOWLY  - 4-6 x daily - 10-15 reps - Tendon Glides  - 4 x daily - 5 reps - 3 second hold - Scar Massage  After 2-3 weeks ost-op: - Full finger stretches   - 3-4 x daily - 3 reps - 15 second hold - Push your knuckles down, pull back hand to feel a stretch  - 3-4 x daily - 3 reps - 15 second hold  For continued management of tenosynovitis (to be withheld for 3 to 4 weeks after hand surgery) - Forearm Supination Stretch  - 3-4 x daily - 3 reps - 15 sec hold - Wrist Flexion Stretch  - 4 x daily - 3-5 reps - 15 sec hold - Seated Wrist Extension Stretch  - 3-6 x daily - 3-5 reps - 15 hold - Stretch Thumb DOWNWARD  - 2-3 x daily - 3 reps - 15 sec hold - Isometric Wrist Extension Pronated  - 2-3 x daily - 4-5 x weekly - 5 reps - 5-10 seconds hold - Seated Isometric Wrist Radial Deviation with Manual Resistance  - 4-6 x daily - 1 sets - 10-15 reps - Seated Isometric Wrist Flexion Supinated with Manual Resistance  - 4-6 x daily - 1 sets - 10-15 reps - Full Fist  - 2-3 x daily - 5 reps - 10 sec hold - Thumb Press  - 2-3 x daily - 5 reps   PATIENT EDUCATION: Education details: See tx section above for details  Person educated: Patient Education method: Verbal Instruction, Teach back, Handouts  Education comprehension: States and demonstrates understanding   HOME EXERCISE PROGRAM: Access Code: [email or web address] URL: [email or web address] Date: 05/01/2024 Prepared by: Melvenia Ada   GOALS: Goals reviewed  with patient? Yes   SHORT TERM GOALS: (STG required if POC>30 days) Target Date: 05/09/2024  Pt will obtain protective, custom orthotic. Goal status: 05/05/2024: Met  2.  Pt will demo/state understanding of initial HEP to improve pain levels and prerequisite motion. Goal status: 06/10/24: MET   LONG TERM GOALS: Target Date: 07/25/24  Pt will improve functional ability by decreased impairment per Quick DASH assessment from 34% to 15% or better, for better quality of life. Goal status: 07/22/24: Down to 18% now, considered met   2.  Pt will improve grip strength in right hand from weak and painful to at least nonpainful 30 lbs for functional use at home and in IADLs. Goal status: 07/22/24: Goal met   3.  Pt will improve A/ROM in right wrist flexion from painful 42 degrees to at least nonpainful 60 degrees, to have functional motion for tasks like reach and grasp.  Goal status:07/22/24: Not met, but improved to 49 degrees, he was advised to continue exercising and stretching in this regard   4.  Pt will improve strength in right wrist radial deviation from painful 4 -/5 MMT to at least 4+/5 MMT to have increased functional ability to carry out selfcare and higher-level homecare tasks with less difficulty. Goal status: 07/22/24: Goal met   5. Pt will decrease pain at worst from 10/10 to 4/10 or better to have better sleep and occupational participation in daily roles. Goal status:  07/22/24: Goal met    ASSESSMENT:  CLINICAL IMPRESSION: 07/21/24: He is now met 4 out of 5 of his long-term goals and will discharge ahead of his upcoming trigger finger release surgery to the right hand.  His lingering tenosynovitis is under control for now, and 4 weeks of rest after surgery will also help this rest.  He was carefully educated on how to move forward after surgery and how to also resume light stretches, light strengthening, light activities within 3 to 4 weeks postop.  He states understanding all  directions, will try to manage these things on his own from here forward.  He does note that he can return with a new order in the future as needed for any therapeutic support.   PLAN:  OT FREQUENCY: Discharge  OT DURATION: Discharge  PLANNED INTERVENTIONS: 97535 self care/ADL training, 02889 therapeutic exercise, 97530 therapeutic activity, 97112 neuromuscular re-education, 97140 manual therapy, 97035 ultrasound, 97760 Orthotic Initial, 97763 Orthotic/Prosthetic subsequent, Dry needling, coping strategies training, patient/family education, and DME and/or AE instructions  CONSULTED AND AGREED WITH PLAN OF CARE: Patient  PLAN FOR NEXT SESSION:   N/A/discharge   Melvenia Ada, OTR/L, CHT 07/22/2024, 4:45 PM

## 2024-07-21 ENCOUNTER — Encounter: Payer: Self-pay | Admitting: Rehabilitative and Restorative Service Providers"

## 2024-07-21 ENCOUNTER — Ambulatory Visit (INDEPENDENT_AMBULATORY_CARE_PROVIDER_SITE_OTHER): Admitting: Rehabilitative and Restorative Service Providers"

## 2024-07-21 DIAGNOSIS — M6281 Muscle weakness (generalized): Secondary | ICD-10-CM | POA: Diagnosis not present

## 2024-07-21 DIAGNOSIS — M25631 Stiffness of right wrist, not elsewhere classified: Secondary | ICD-10-CM | POA: Diagnosis not present

## 2024-07-21 DIAGNOSIS — M25521 Pain in right elbow: Secondary | ICD-10-CM

## 2024-07-22 ENCOUNTER — Encounter: Payer: Self-pay | Admitting: Rehabilitative and Restorative Service Providers"

## 2024-07-22 ENCOUNTER — Other Ambulatory Visit: Payer: Self-pay

## 2024-07-22 DIAGNOSIS — M65341 Trigger finger, right ring finger: Secondary | ICD-10-CM

## 2024-07-22 DIAGNOSIS — M65331 Trigger finger, right middle finger: Secondary | ICD-10-CM

## 2024-07-23 ENCOUNTER — Encounter: Admitting: Rehabilitative and Restorative Service Providers"

## 2024-07-24 ENCOUNTER — Other Ambulatory Visit: Payer: Self-pay | Admitting: Orthopedic Surgery

## 2024-07-24 MED ORDER — TRAMADOL HCL 50 MG PO TABS: 50.0000 mg | ORAL_TABLET | Freq: Four times a day (QID) | ORAL | 0 refills | Status: AC | PRN

## 2024-07-27 ENCOUNTER — Encounter: Payer: Self-pay | Admitting: Orthopedic Surgery

## 2024-07-27 ENCOUNTER — Other Ambulatory Visit: Payer: Self-pay | Admitting: Orthopaedic Surgery

## 2024-07-27 ENCOUNTER — Encounter: Payer: Self-pay | Admitting: Orthopaedic Surgery

## 2024-07-27 MED ORDER — OXYCODONE HCL 5 MG PO TABS: 5.0000 mg | ORAL_TABLET | ORAL | 0 refills | Status: AC | PRN

## 2024-07-28 ENCOUNTER — Telehealth (HOSPITAL_BASED_OUTPATIENT_CLINIC_OR_DEPARTMENT_OTHER): Payer: Self-pay

## 2024-07-28 ENCOUNTER — Ambulatory Visit (HOSPITAL_BASED_OUTPATIENT_CLINIC_OR_DEPARTMENT_OTHER): Admitting: Physician Assistant

## 2024-07-28 ENCOUNTER — Encounter (HOSPITAL_BASED_OUTPATIENT_CLINIC_OR_DEPARTMENT_OTHER): Payer: Self-pay | Admitting: Physician Assistant

## 2024-07-28 ENCOUNTER — Telehealth: Payer: Self-pay | Admitting: Orthopedic Surgery

## 2024-07-28 ENCOUNTER — Ambulatory Visit (HOSPITAL_BASED_OUTPATIENT_CLINIC_OR_DEPARTMENT_OTHER)

## 2024-07-28 DIAGNOSIS — G8929 Other chronic pain: Secondary | ICD-10-CM

## 2024-07-28 DIAGNOSIS — M25511 Pain in right shoulder: Secondary | ICD-10-CM

## 2024-07-28 MED ORDER — OXYCODONE-ACETAMINOPHEN 5-325 MG PO TABS: 1.0000 | ORAL_TABLET | ORAL | 0 refills | Status: AC | PRN

## 2024-07-28 NOTE — Telephone Encounter (Signed)
 Maryann FYI. Scheduled pt for this afternoon with you

## 2024-07-28 NOTE — Telephone Encounter (Signed)
 Seeing Jose [name] this afternoon.

## 2024-07-28 NOTE — Progress Notes (Signed)
 Office Visit Note   Patient: Jose [name]           Date of Birth: [birth year]           MRN: [id number] Visit Date: 07/28/2024              Requested by: Birda Ahumada, MD [address], [address] [place],  [place] [address] PCP: Birda Ahumada, MD  Chief Complaint  Patient presents with   Right Shoulder - Pain      HPI: Patient is a pleasant 73 year old gentleman who is a patient of Dr. Sedrick.  He has a history of advanced osteoarthritis of his right glenohumeral joint.  He has gotten good relief with glenohumeral injections.  He recently had surgery with Dr. Walt.  He is not sure whether how he was laying on the operating room table but he has increased pain.  No recent injury otherwise.  Requesting an injection today  Assessment & Plan: Visit Diagnoses:  1. Chronic right shoulder pain     Plan: Patient said he called the office states that they could not do an injection until August 25.  I will prescribe patient a small amount of oxycodone  which she is requesting.  Have made a request to see if he could get in sooner for an injection.  Follow-Up Instructions: No follow-ups on file.   Ortho Exam  Patient is alert, oriented, no adenopathy, well-dressed, normal affect, normal respiratory effort. Examination of his right shoulder he has very limited motion secondary to pain he is tender over the glenohumeral joint with external rotation.    Imaging: No results found. No images are attached to the encounter.  Labs: Lab Results  Component Value Date   HGBA1C 5.3 12/18/2019   HGBA1C 5.6 11/21/2018   HGBA1C 5.5 09/04/2017   ESRSEDRATE 77 (H) 01/25/2009   LABURIC 3.2 (L) 06/25/2024   LABURIC 3.5 (L) 12/27/2023   LABURIC 4.1 06/26/2023   REPTSTATUS 05/15/2017 FINAL 05/14/2017   CULT NO GROWTH 05/14/2017     Lab Results  Component Value Date   ALBUMIN 4.2 09/11/2023   ALBUMIN 4.3 05/25/2022   ALBUMIN 4.2 03/07/2022    Lab Results  Component Value Date    MG 1.6 (L) 12/24/2019   No results found for: VD25OH  No results found for: PREALBUMIN    Latest Ref Rng & Units 06/25/2024    1:30 PM 12/27/2023    1:52 PM 06/26/2023   11:37 AM  CBC EXTENDED  WBC 3.8 - 10.8 Thousand/uL 7.4  7.7  10.4   RBC 4.20 - 5.80 Million/uL 4.49  4.86  4.61   Hemoglobin 13.2 - 17.1 g/dL 85.7  85.1  85.4   HCT 38.5 - 50.0 % 42.9  45.1  43.7   Platelets 140 - 400 Thousand/uL 129  156  153   NEUT# 1,500 - 7,800 cells/uL 5,106  5,159  7,582   Lymph# 850 - 3,900 cells/uL   1,550      There is no height or weight on file to calculate BMI.  Orders:  Orders Placed This Encounter  Procedures   DG Shoulder Right   No orders of the defined types were placed in this encounter.    Procedures: No procedures performed  Clinical Data: No additional findings.  ROS:  All other systems negative, except as noted in the HPI. Review of Systems  Objective: Vital Signs: There were no vitals taken for this visit.  Specialty Comments:  No specialty comments available.  PMFS History: Patient Active Problem List   Diagnosis Date Noted   Hypertension    Severe aortic stenosis 10/08/2019   Candidiasis    Abnormality of gait    Hypoalbuminemia due to protein-calorie malnutrition [facility])    History of gout    Sleep disturbance    Constipation    Rupture of quadriceps tendon, right, sequela 06/19/2017   Quadriceps tendon rupture, left, sequela 06/19/2017   Acute blood loss anemia 06/19/2017   Fall    History of subdural hematoma    Post-operative pain    Recurrent falls    Quadriceps tendon rupture 06/15/2017   Primary osteoarthritis of both knees 02/28/2017   Primary osteoarthritis of both hands 02/28/2017   Uricacidemia 02/28/2017   Pain in joint of left knee 02/07/2017   Idiopathic chronic gout, unspecified site, without tophus (tophi) 11/29/2016   HEMATURIA UNSPECIFIED 01/25/2009   Abdominal pain 01/25/2009   XERODERMA 03/10/2008   UNS ADVRS EFF  UNS RX MEDICINAL&BIOLOGICAL SBSTNC 03/10/2008   ADJUSTMENT REACTION WITH PHYSICAL SYMPTOMS 02/14/2008   MUSCLE SPASM 02/14/2008   ACUTE PROSTATITIS 12/16/2007   BREAST LUMP OR MASS, RIGHT 07/12/2007   H/O: RCT (rotator cuff tear) 01/13/2007   GOUT 01/08/2007   Past Medical History:  Diagnosis Date   Arthritis    R shoulder, great toes, hands- has gout & has injections in knees with Dr. [name]    Bicuspid aortic valve with ascending aorta 4.0 to 4.5 cm in diameter    CAD (coronary artery disease)    Cancer (HCC)    skin ca-    GERD (gastroesophageal reflux disease)    Headache    History of hiatal hernia    Hypertension    Prostate cancer (HCC)    S/P TAVR (transcatheter aortic valve replacement)    SDH (subdural hematoma) (HCC)    Severe aortic stenosis    Thoracic ascending aortic aneurysm (HCC)    Vertebral artery aneurysm (HCC)     Family History  Problem Relation Age of Onset   Parkinson's disease Mother    Heart disease Father    Parkinson's disease Brother    Diabetes Brother     Past Surgical History:  Procedure Laterality Date   BRAIN SURGERY  2016   evacuation of SDH   CARDIAC CATHETERIZATION     CARPAL TUNNEL RELEASE Bilateral    embolization of left vertebral artery aneurysm with pipeline/coil  10/31/2022   GREEN LIGHT LASER TURP (TRANSURETHRAL RESECTION OF PROSTATE  04/27/2017   KNEE ARTHROPLASTY     LEFT HEART CATH AND CORONARY ANGIOGRAPHY N/A 10/08/2019   Procedure: LEFT HEART CATH AND CORONARY ANGIOGRAPHY;  Surgeon: Wonda Sharper, MD;  Location: [facility] INVASIVE CV LAB;  Service: Cardiovascular;  Laterality: N/A;   QUADRICEPS TENDON REPAIR Bilateral 06/15/2017   Procedure: REPAIR QUADRICEP TENDON;  Surgeon: Addie Cordella Hamilton, MD;  Location: [facility] OR;  Service: Orthopedics;  Laterality: Bilateral;   SHOULDER SURGERY Right    TEE WITHOUT CARDIOVERSION N/A 07/03/2022   Procedure: TRANSESOPHAGEAL ECHOCARDIOGRAM (TEE);  Surgeon: Delford Maude BROCKS, MD;   Location[facility] ENDOSCOPY;  Service: Cardiovascular;  Laterality: N/A;   TRANSCATHETER AORTIC VALVE REPLACEMENT, TRANSFEMORAL  12/23/2019   TRANSCATHETER AORTIC VALVE REPLACEMENT, TRANSFEMORAL N/A 12/23/2019   Procedure: TRANSCATHETER AORTIC VALVE REPLACEMENT, TRANSFEMORAL;  Surgeon: Wonda Sharper, MD;  Location: [facility] OR;  Service: Open Heart Surgery;  Laterality: N/A;   VASCULAR SURGERY     Social History   Occupational History   Not on file  Tobacco Use  Smoking status: Never    Passive exposure: Never   Smokeless tobacco: Never  Vaping Use   Vaping status: Never Used  Substance and Sexual Activity   Alcohol  use: Yes    Comment: 1 or 2 glasses of wine weekly   Drug use: No   Sexual activity: Not on file

## 2024-07-28 NOTE — Telephone Encounter (Signed)
 hey Jose [name] wanted to see if he Dr. Addie can get this patient in sooner that 25th for an injection. she couldnt do inj cause it was a glumeral.

## 2024-07-28 NOTE — Telephone Encounter (Signed)
 Pt called stating he need a sooner appt. Pt states he thinks his shoulder has a tear and needing a cortisone injection. Please call pt about this message at [phone]. Added pt to [facility] cancellation list.

## 2024-07-29 ENCOUNTER — Encounter: Payer: Self-pay | Admitting: Orthopedic Surgery

## 2024-07-29 NOTE — Telephone Encounter (Signed)
 scheduled

## 2024-07-30 ENCOUNTER — Other Ambulatory Visit: Payer: Self-pay

## 2024-07-30 ENCOUNTER — Ambulatory Visit (INDEPENDENT_AMBULATORY_CARE_PROVIDER_SITE_OTHER): Admitting: Orthopedic Surgery

## 2024-07-30 DIAGNOSIS — M25511 Pain in right shoulder: Secondary | ICD-10-CM

## 2024-07-30 DIAGNOSIS — G8929 Other chronic pain: Secondary | ICD-10-CM

## 2024-07-30 DIAGNOSIS — M19011 Primary osteoarthritis, right shoulder: Secondary | ICD-10-CM

## 2024-07-30 NOTE — Telephone Encounter (Signed)
 FYI told pt this was normal and to elevate hand

## 2024-07-30 NOTE — Telephone Encounter (Signed)
 SABRA

## 2024-07-31 ENCOUNTER — Encounter: Payer: Self-pay | Admitting: Orthopedic Surgery

## 2024-07-31 MED ORDER — LIDOCAINE HCL 1 % IJ SOLN
5.0000 mL | INTRAMUSCULAR | Status: AC | PRN
  Administered 2024-07-30: 5 mL

## 2024-07-31 MED ORDER — TRIAMCINOLONE ACETONIDE 40 MG/ML IJ SUSP
40.0000 mg | INTRAMUSCULAR | Status: AC | PRN
  Administered 2024-07-30: 40 mg via INTRA_ARTICULAR

## 2024-07-31 NOTE — Progress Notes (Signed)
 Office Visit Note   Patient: Jose [name]           Date of Birth: August 04, 1951           MRN: [id number] Visit Date: 07/30/2024 Requested by: Birda Ahumada, MD [address] [place],  [place] [address] PCP: Birda Ahumada, MD  Subjective: Chief Complaint  Patient presents with   Right Shoulder - Pain    Wants injection    HPI: Jose [name] is a 73 y.o. male who presents to the office reporting right shoulder pain.  Had trigger finger release several weeks ago.  Feels like after that procedure his arm may have been positioned in such a way that aggravated his pre-existing right shoulder arthritis.  Taking tramadol  which helps.  Has had prior injections in the glenohumeral joint which has helped him in the past.  Left shoulder functional but has been affected by polio..                ROS: All systems reviewed are negative as they relate to the chief complaint within the history of present illness.  Patient denies fevers or chills.  Assessment & Plan: Visit Diagnoses:  1. Chronic right shoulder pain   2. Primary osteoarthritis, right shoulder     Plan: Impression is right shoulder glenohumeral arthritis exacerbation of pain.  Glenohumeral joint injection performed today.  Will set him up with physical therapy with Delon and Grayce for range of motion and mobility.  2 times a week for 4 weeks.  I think Keng may be heading for shoulder replacement at some time in the future but for now I think we can calm it down enough with this injection.  Does have some limitation of motion but overall reasonably functional shoulder.  Follow-Up Instructions: No follow-ups on file.   Orders:  Orders Placed This Encounter  Procedures   US  Guided Needle Placement - No Linked Charges   Ambulatory referral to Physical Therapy   No orders of the defined types were placed in this encounter.     Procedures: Large Joint Inj: R glenohumeral on 07/30/2024 7:00 AM Indications:  diagnostic evaluation and pain Details: 22 G 3.5 in needle, ultrasound-guided posterior approach  Arthrogram: No  Medications: 5 mL lidocaine  1 %; 40 mg triamcinolone  acetonide 40 MG/ML Outcome: tolerated well, no immediate complications Procedure, treatment alternatives, risks and benefits explained, specific risks discussed. Consent was given by the patient. Immediately prior to procedure a time out was called to verify the correct patient, procedure, equipment, support staff and site/side marked as required. Patient was prepped and draped in the usual sterile fashion.       Clinical Data: No additional findings.  Objective: Vital Signs: There were no vitals taken for this visit.  Physical Exam:  Constitutional: Patient appears well-developed HEENT:  Head: Normocephalic Eyes:EOM are normal Neck: Normal range of motion Cardiovascular: Normal rate Pulmonary/chest: Effort normal Neurologic: Patient is alert Skin: Skin is warm Psychiatric: Patient has normal mood and affect  Ortho Exam: Ortho exam demonstrates functional deltoid on the right.  Has good rotator cuff strength to infraspinatus supraspinatus and subscap testing.  Range of motion is 50/85/120 on the right.  Does have some coarseness in grinding consistent with known radiographic diagnosis of glenohumeral joint arthritis.  Specialty Comments:  No specialty comments available.  Imaging: US  Guided Needle Placement - No Linked Charges Result Date: 07/31/2024 Ultrasound imaging demonstrates needle placement into the glenohumeral joint with injection of fluid into  the joint and no complicating features. Right shoulder    PMFS History: Patient Active Problem List   Diagnosis Date Noted   Hypertension    Severe aortic stenosis 10/08/2019   Candidiasis    Abnormality of gait    Hypoalbuminemia due to protein-calorie malnutrition [facility])    History of gout    Sleep disturbance    Constipation    Rupture of  quadriceps tendon, right, sequela 06/19/2017   Quadriceps tendon rupture, left, sequela 06/19/2017   Acute blood loss anemia 06/19/2017   Fall    History of subdural hematoma    Post-operative pain    Recurrent falls    Quadriceps tendon rupture 06/15/2017   Primary osteoarthritis of both knees 02/28/2017   Primary osteoarthritis of both hands 02/28/2017   Uricacidemia 02/28/2017   Pain in joint of left knee 02/07/2017   Idiopathic chronic gout, unspecified site, without tophus (tophi) 11/29/2016   HEMATURIA UNSPECIFIED 01/25/2009   Abdominal pain 01/25/2009   XERODERMA 03/10/2008   UNS ADVRS EFF UNS RX MEDICINAL&BIOLOGICAL SBSTNC 03/10/2008   ADJUSTMENT REACTION WITH PHYSICAL SYMPTOMS 02/14/2008   MUSCLE SPASM 02/14/2008   ACUTE PROSTATITIS 12/16/2007   BREAST LUMP OR MASS, RIGHT 07/12/2007   H/O: RCT (rotator cuff tear) 01/13/2007   GOUT 01/08/2007   Past Medical History:  Diagnosis Date   Arthritis    R shoulder, great toes, hands- has gout & has injections in knees with Dr. [name]    Bicuspid aortic valve with ascending aorta 4.0 to 4.5 cm in diameter    CAD (coronary artery disease)    Cancer (HCC)    skin ca-    GERD (gastroesophageal reflux disease)    Headache    History of hiatal hernia    Hypertension    Prostate cancer (HCC)    S/P TAVR (transcatheter aortic valve replacement)    SDH (subdural hematoma) (HCC)    Severe aortic stenosis    Thoracic ascending aortic aneurysm (HCC)    Vertebral artery aneurysm (HCC)     Family History  Problem Relation Age of Onset   Parkinson's disease Mother    Heart disease Father    Parkinson's disease Brother    Diabetes Brother     Past Surgical History:  Procedure Laterality Date   BRAIN SURGERY  2016   evacuation of SDH   CARDIAC CATHETERIZATION     CARPAL TUNNEL RELEASE Bilateral    embolization of left vertebral artery aneurysm with pipeline/coil  10/31/2022   GREEN LIGHT LASER TURP (TRANSURETHRAL RESECTION  OF PROSTATE  04/27/2017   KNEE ARTHROPLASTY     LEFT HEART CATH AND CORONARY ANGIOGRAPHY N/A 10/08/2019   Procedure: LEFT HEART CATH AND CORONARY ANGIOGRAPHY;  Surgeon: Wonda Sharper, MD;  Location: [facility] INVASIVE CV LAB;  Service: Cardiovascular;  Laterality: N/A;   QUADRICEPS TENDON REPAIR Bilateral 06/15/2017   Procedure: REPAIR QUADRICEP TENDON;  Surgeon: Addie Cordella Hamilton, MD;  Location: [facility] OR;  Service: Orthopedics;  Laterality: Bilateral;   SHOULDER SURGERY Right    TEE WITHOUT CARDIOVERSION N/A 07/03/2022   Procedure: TRANSESOPHAGEAL ECHOCARDIOGRAM (TEE);  Surgeon: Delford Maude BROCKS, MD;  Location: [facility] ENDOSCOPY;  Service: Cardiovascular;  Laterality: N/A;   TRANSCATHETER AORTIC VALVE REPLACEMENT, TRANSFEMORAL  12/23/2019   TRANSCATHETER AORTIC VALVE REPLACEMENT, TRANSFEMORAL N/A 12/23/2019   Procedure: TRANSCATHETER AORTIC VALVE REPLACEMENT, TRANSFEMORAL;  Surgeon: Wonda Sharper, MD;  Location: [facility] OR;  Service: Open Heart Surgery;  Laterality: N/A;   VASCULAR SURGERY     Social History  Occupational History   Not on file  Tobacco Use   Smoking status: Never    Passive exposure: Never   Smokeless tobacco: Never  Vaping Use   Vaping status: Never Used  Substance and Sexual Activity   Alcohol  use: Yes    Comment: 1 or 2 glasses of wine weekly   Drug use: No   Sexual activity: Not on file

## 2024-08-04 ENCOUNTER — Telehealth: Payer: Self-pay

## 2024-08-04 NOTE — Telephone Encounter (Signed)
 Patient called the office requesting a direct call from Waddell Craze. Please advise.

## 2024-08-05 ENCOUNTER — Telehealth: Payer: Self-pay

## 2024-08-05 NOTE — Telephone Encounter (Signed)
 Attempted to contact the patient and left a message to call the office back.

## 2024-08-05 NOTE — Telephone Encounter (Signed)
 Attempted to return the patient's call--please try to clarify what the concern is when he calls back since I will be with patients. I can try to return his call again at the end of the clinic day.

## 2024-08-06 ENCOUNTER — Ambulatory Visit: Admitting: Orthopedic Surgery

## 2024-08-06 DIAGNOSIS — M65341 Trigger finger, right ring finger: Secondary | ICD-10-CM

## 2024-08-06 DIAGNOSIS — M65331 Trigger finger, right middle finger: Secondary | ICD-10-CM

## 2024-08-06 NOTE — Progress Notes (Signed)
   Jose [name] - 73 y.o. male MRN [id number]  Date of birth: [birth year]  Office Visit Note: Visit Date: 08/06/2024 PCP: Birda Ahumada, MD Referred by: Birda Ahumada, MD  Subjective:  HPI: Jose [name] is a 73 y.o. male who presents today for follow up 2 weeks status post right long and ring finger trigger digit release.  He is doing well overall, triggering has resolved.  Pain in the hand is well-controlled.  Did have some shoulder pain after surgery which has responded well to a recent injection to the glenohumeral region.  Pertinent ROS were reviewed with the patient and found to be negative unless otherwise specified above in HPI.   Assessment & Plan: Visit Diagnoses:  1. Trigger finger, right middle finger   2. Trigger finger, right ring finger     Plan: He is doing quite well postoperatively.  Sutures removed today.  He will be seen by occupational therapy in the near future to begin exercise program with transition to home exercise when appropriate.  Follow-up with myself in approximate 1 month.  Follow-up: No follow-ups on file.   Meds & Orders: No orders of the defined types were placed in this encounter.   Orders Placed This Encounter  Procedures   Ambulatory referral to Occupational Therapy     Procedures: No procedures performed       Objective:   Vital Signs: There were no vitals taken for this visit.  Ortho Exam Right hand: - Well-healing incision at the base of the long and ring finger, skin is well-approximated, no erythema or drainage, sutures removed today - Able to perform full digital range of motion without residual clicking or locking - Sensation intact distally, hand is warm well-perfused   Imaging: No results found.   Anshul Afton Alderton, M.D. [facility], Hand Surgery

## 2024-08-07 NOTE — Telephone Encounter (Signed)
 Waddell called patient.

## 2024-08-11 ENCOUNTER — Ambulatory Visit: Admitting: Orthopedic Surgery

## 2024-08-13 NOTE — Therapy (Signed)
 OUTPATIENT OCCUPATIONAL THERAPY ORTHO EVALUATION AND TREATMENT NOTE  Patient Name: Jose [name] MRN: [id number] DOB:November 28, 1951, 73 y.o., male Today's Date: 08/14/2024  REFERRING PROVIDER: Arlinda Buster, MD   END OF SESSION:   Past Medical History:  Diagnosis Date   Arthritis    R shoulder, great toes, hands- has gout & has injections in knees with Dr. [name]    Bicuspid aortic valve with ascending aorta 4.0 to 4.5 cm in diameter    CAD (coronary artery disease)    Cancer (HCC)    skin ca-    GERD (gastroesophageal reflux disease)    Headache    History of hiatal hernia    Hypertension    Prostate cancer (HCC)    S/P TAVR (transcatheter aortic valve replacement)    SDH (subdural hematoma) (HCC)    Severe aortic stenosis    Thoracic ascending aortic aneurysm (HCC)    Vertebral artery aneurysm [facility])    Past Surgical History:  Procedure Laterality Date   BRAIN SURGERY  2016   evacuation of SDH   CARDIAC CATHETERIZATION     CARPAL TUNNEL RELEASE Bilateral    embolization of left vertebral artery aneurysm with pipeline/coil  10/31/2022   GREEN LIGHT LASER TURP (TRANSURETHRAL RESECTION OF PROSTATE  04/27/2017   KNEE ARTHROPLASTY     LEFT HEART CATH AND CORONARY ANGIOGRAPHY N/A 10/08/2019   Procedure: LEFT HEART CATH AND CORONARY ANGIOGRAPHY;  Surgeon: Wonda Sharper, MD;  Location: [facility] INVASIVE CV LAB;  Service: Cardiovascular;  Laterality: N/A;   QUADRICEPS TENDON REPAIR Bilateral 06/15/2017   Procedure: REPAIR QUADRICEP TENDON;  Surgeon: Addie Cordella Glendia, MD;  Location: [facility] OR;  Service: Orthopedics;  Laterality: Bilateral;   SHOULDER SURGERY Right    TEE WITHOUT CARDIOVERSION N/A 07/03/2022   Procedure: TRANSESOPHAGEAL ECHOCARDIOGRAM (TEE);  Surgeon: Delford Maude BROCKS, MD;  Location: [facility] ENDOSCOPY;  Service: Cardiovascular;  Laterality: N/A;   TRANSCATHETER AORTIC VALVE REPLACEMENT, TRANSFEMORAL  12/23/2019   TRANSCATHETER AORTIC VALVE REPLACEMENT, TRANSFEMORAL N/A  12/23/2019   Procedure: TRANSCATHETER AORTIC VALVE REPLACEMENT, TRANSFEMORAL;  Surgeon: Wonda Sharper, MD;  Location: [facility] OR;  Service: Open Heart Surgery;  Laterality: N/A;   VASCULAR SURGERY     Patient Active Problem List   Diagnosis Date Noted   Hypertension    Severe aortic stenosis 10/08/2019   Candidiasis    Abnormality of gait    Hypoalbuminemia due to protein-calorie malnutrition [facility])    History of gout    Sleep disturbance    Constipation    Rupture of quadriceps tendon, right, sequela 06/19/2017   Quadriceps tendon rupture, left, sequela 06/19/2017   Acute blood loss anemia 06/19/2017   Fall    History of subdural hematoma    Post-operative pain    Recurrent falls    Quadriceps tendon rupture 06/15/2017   Primary osteoarthritis of both knees 02/28/2017   Primary osteoarthritis of both hands 02/28/2017   Uricacidemia 02/28/2017   Pain in joint of left knee 02/07/2017   Idiopathic chronic gout, unspecified site, without tophus (tophi) 11/29/2016   HEMATURIA UNSPECIFIED 01/25/2009   Abdominal pain 01/25/2009   XERODERMA 03/10/2008   UNS ADVRS EFF UNS RX MEDICINAL&BIOLOGICAL SBSTNC 03/10/2008   ADJUSTMENT REACTION WITH PHYSICAL SYMPTOMS 02/14/2008   MUSCLE SPASM 02/14/2008   ACUTE PROSTATITIS 12/16/2007   BREAST LUMP OR MASS, RIGHT 07/12/2007   H/O: RCT (rotator cuff tear) 01/13/2007   GOUT 01/08/2007     ONSET DATE:  DOS 07/24/24  REFERRING DIAG:  F34.668 (ICD-10-CM) - Trigger finger,  right middle finger  M65.341 (ICD-10-CM) - Trigger finger, right ring finger    THERAPY DIAG:     Muscle weakness (generalized)  Other lack of coordination  Stiffness of right hand, not elsewhere classified  Pain in right wrist  Pain in right hand  Localized edema  Rationale for Evaluation and Treatment: Rehabilitation  SUBJECTIVE:   SUBJECTIVE STATEMENT: The patient states hx of triggering and pain in their hand and subsequent surgical release. The patient  states having some stiffness, pain, decreased ability to make a fist and perform I/ADLs.     PERTINENT HISTORY: The patient is now approx 2 weeks s/p Rt hand RF, MF finger TFRs.   PRECAUTIONS: None relative to this evaluation and episode of care.   RED FLAGS: None   WEIGHT BEARING RESTRICTIONS: Yes: caution with weightbearing for the next 4-6 weeks, recommended less than 5lbs for next 2 weeks with affected hand  PAIN:  Are you having pain? Yes: NPRS scale: between mild and moderate now at rest  Pain location:  sx area Pain description: aching and sore Aggravating factors: gripping/squeezing Relieving factors: rest  FALLS: Has patient fallen in last 6 months? No, not a fall risk  PLOF: Independent with I/ADLs  PATIENT GOALS: To improve motion, function with affected surgical hand  NEXT MD VISIT: PRN    OBJECTIVE MEASURES:   ADLs: Overall ADLs: States decreased ability to grab, hold household objects, pain and difficulty to open containers, perform FMS tasks (manipulate fasteners on clothing).     UPPER EXTREMITY ROM:     A/ROM Right eval  Wrist flexion Approx 60  Wrist extension Approx 55  (Blank rows = not tested)                    Hand A/ROM Right eval  Full Fist Ability (or Gap to Distal Palmar Crease) Fingers loosely touch palm, able to extend with some minimal lag at middle finger PIP joint  Thumb Opposition  (Kapandji Scale)  8/10, WFL  (Blank rows = not tested)   HAND STRENGTH & FUNCTION: Eval: Observed weakness in affected right hand/arm, grossly 3-/5 MMT, but specific gripping and resistance training contraindicated today.  Per protocols we will begin light grip training in about 2 weeks.   COORDINATION: Eval: No significant coordination impairments in the right hand at eval  SENSATION: Eval:  Light touch mildly diminished especially through sx area. Expected to improve with HEP and recommendations.   EDEMA:   Eval:  Mildly swollen in surgical  hand today.  Expected to improve with HEP and recommendations.   COGNITION: Eval: Overall cognitive status: WFL for evaluation today   OBSERVATIONS:   Eval: Surgical site is clean and no overt signs of infection, no drainage, signs of dehiscence, etc.  Tenderness and swelling is within normal limits for post-op timeframe.     TODAY'S TREATMENT:  Post-evaluation treatment:   The patient was given safety information for managing post-op wound, to start with gentle scar mobilizations, not allow it to become macerated by keeping it covered for too long-it should be healed over by next week and not need covered. The patient should also avoid any strong gripping, push, pull, weight bearing or repetitive motion for the next 2 weeks.  The patient  should not be doing painful activities, and also needs to monitor soreness through the first dorsal compartment to prevent de Quervain's tenosynovitis from coming back.   After a 2 to 3 weeks, the patient can progressively return  to all light, normal activities. Sports and heavy activities should be withheld for a total of 1 additional month.   The patient was also educated (explanation and demonstration) on the following home exercise program including tolerable range of motion, gentle passive range of motion, scar care, progressive desensitization, prevention of soft tissue contractures, etc. The patient states understanding all directions and feels comfortable with doing this at home, but would like a follow-up in a couple of weeks to upgrade to light hand strengthening and to check his status.  Exercises educated on performed and reviewed today: - Bend and Pull Back Wrist SLOWLY  - 4-6 x daily - 10-15 reps - Tendon Glides  - 3-4 x daily - 5 reps - 3 second hold - Wrist Prayer Stretch  - 3-4 x daily - 3 reps - 15 seconds hold - Full finger stretches   - 3-4 x daily - 3 reps - 15 second hold - Push your knuckles down, pull back hand to feel a stretch  - 3-4  x daily - 3 reps - 15 second hold - Stretch thumb toward base of small finger (put hand in LAP)  - 2-3 x daily - 3-5 reps - 15 sec hold Patient Education - Scar Massage     PATIENT EDUCATION: Education details: See tx section above for details  Person educated: Patient Education method: Verbal Instruction, Teach back, Handouts  Education comprehension: States and demonstrates understanding, additional education required   HOME EXERCISE PROGRAM: Access Code: [email or web address] URL: [email or web address] Date: 08/14/2024 Prepared by: Melvenia Ada   GOALS: Goals reviewed with patient? Yes   SHORT TERM GOALS: (STG required if POC>30 days) Target Date: 09/05/24  1.  Pt will demo/state understanding of initial HEP and therapist recommendations to improve pain levels, improve motions and ability and eventually return to normal activities.   Goal status: 08/14/24 MET  2.  In 2 weeks, patient will tolerate gripping and pinching with therapy putty and learned a home exercise program to improve his ability and status for IADLs.  GOAL STATUS: Initial    ASSESSMENT:  CLINICAL IMPRESSION: Patient is a 73 y.o. male who was seen today for occupational therapy evaluation for swelling, pain, weakness and decreased functional ability following trigger finger release procedure. The patient is appropriate for OT rehab services and benefited from treatment today. The patient got copious education/treatment today for self-care, wound management, exercises and how to transition to normal activities in the next 2-3 weeks.  The patient will benefit from outpatient occupational therapy services over the next 3 weeks most likely, and be able to discharge by the end of that time.    PERFORMANCE DEFICITS: in functional skills including ADLs, IADLs, coordination, dexterity, sensation, edema, ROM, strength, pain, fascial restrictions, flexibility, Fine motor control, body mechanics, endurance,  decreased knowledge of precautions, wound, and UE functional use, cognitive skills including problem solving and safety awareness, and psychosocial skills including coping strategies, environmental adaptation, and habits.   IMPAIRMENTS: are limiting patient from ADLs, IADLs, rest and sleep, leisure, and social participation.   COMORBIDITIES: may have co-morbidities  that affects occupational performance. Patient will benefit from skilled OT to address above impairments and improve overall function.  MODIFICATION OR ASSISTANCE TO COMPLETE EVALUATION: No modification of tasks or assist necessary to complete an evaluation.  OT OCCUPATIONAL PROFILE AND HISTORY: Problem focused assessment: Including review of records relating to presenting problem.  CLINICAL DECISION MAKING: LOW - limited treatment options, no task modification necessary  REHAB POTENTIAL: Excellent  EVALUATION COMPLEXITY: Low      PLAN:  OT FREQUENCY: Up to 1 time a week  OT DURATION: 4 total sessions as needed for 3 total weeks through 09/05/2024  PLANNED INTERVENTIONS: self care/ADL training, therapeutic exercise, therapeutic activity, neuromuscular re-education, manual therapy, scar mobilization, passive range of motion, splinting, ultrasound, fluidotherapy, compression bandaging, moist heat, cryotherapy, contrast bath, patient/family education, energy conservation, coping strategies training, and Re-evaluation  RECOMMENDED OTHER SERVICES: Is in physical therapy for recent right shoulder pain.  PT is working with OT to ensure no exacerbation of the right hand  CONSULTED AND AGREED WITH PLAN OF CARE: Patient  PLAN FOR NEXT SESSION:   Follow-up in 2 weeks to check status and to begin light grip training with therapy putty consider discharge when goals are met   Melvenia Ada, OTR/L, CHT 08/14/2024, 11:05 AM

## 2024-08-14 ENCOUNTER — Ambulatory Visit (INDEPENDENT_AMBULATORY_CARE_PROVIDER_SITE_OTHER): Admitting: Physical Therapy

## 2024-08-14 ENCOUNTER — Ambulatory Visit (INDEPENDENT_AMBULATORY_CARE_PROVIDER_SITE_OTHER): Admitting: Rehabilitative and Restorative Service Providers"

## 2024-08-14 ENCOUNTER — Encounter: Payer: Self-pay | Admitting: Rehabilitative and Restorative Service Providers"

## 2024-08-14 ENCOUNTER — Encounter: Payer: Self-pay | Admitting: Physical Therapy

## 2024-08-14 DIAGNOSIS — M79641 Pain in right hand: Secondary | ICD-10-CM | POA: Diagnosis not present

## 2024-08-14 DIAGNOSIS — M6281 Muscle weakness (generalized): Secondary | ICD-10-CM | POA: Diagnosis not present

## 2024-08-14 DIAGNOSIS — G8929 Other chronic pain: Secondary | ICD-10-CM

## 2024-08-14 DIAGNOSIS — R278 Other lack of coordination: Secondary | ICD-10-CM

## 2024-08-14 DIAGNOSIS — M25641 Stiffness of right hand, not elsewhere classified: Secondary | ICD-10-CM | POA: Diagnosis not present

## 2024-08-14 DIAGNOSIS — M25611 Stiffness of right shoulder, not elsewhere classified: Secondary | ICD-10-CM

## 2024-08-14 DIAGNOSIS — M25531 Pain in right wrist: Secondary | ICD-10-CM

## 2024-08-14 DIAGNOSIS — M25511 Pain in right shoulder: Secondary | ICD-10-CM | POA: Diagnosis not present

## 2024-08-14 DIAGNOSIS — R293 Abnormal posture: Secondary | ICD-10-CM

## 2024-08-14 DIAGNOSIS — R600 Localized edema: Secondary | ICD-10-CM

## 2024-08-14 NOTE — Therapy (Addendum)
 OUTPATIENT PHYSICAL THERAPY UPPER EXTREMITY EVALUATION   Patient Name: Jose [name] MRN: [id number] DOB:August 05, 1951, 73 y.o., male Today's Date: 08/14/2024  END OF SESSION:  PT End of Session - 08/14/24 1544     Visit Number 1    Number of Visits 16    Date for PT Re-Evaluation 10/09/24    Authorization Type Medicare A/B & BCBS supp    Progress Note Due on Visit 10    PT Start Time 0930    PT Stop Time 1013    PT Time Calculation (min) 43 min    Activity Tolerance Patient tolerated treatment well;Patient limited by pain    Behavior During Therapy WFL for tasks assessed/performed          Past Medical History:  Diagnosis Date   Arthritis    R shoulder, great toes, hands- has gout & has injections in knees with Dr. [name]    Bicuspid aortic valve with ascending aorta 4.0 to 4.5 cm in diameter    CAD (coronary artery disease)    Cancer (HCC)    skin ca-    GERD (gastroesophageal reflux disease)    Headache    History of hiatal hernia    Hypertension    Prostate cancer (HCC)    S/P TAVR (transcatheter aortic valve replacement)    SDH (subdural hematoma) (HCC)    Severe aortic stenosis    Thoracic ascending aortic aneurysm (HCC)    Vertebral artery aneurysm (HCC)    Past Surgical History:  Procedure Laterality Date   BRAIN SURGERY  2016   evacuation of SDH   CARDIAC CATHETERIZATION     CARPAL TUNNEL RELEASE Bilateral    embolization of left vertebral artery aneurysm with pipeline/coil  10/31/2022   GREEN LIGHT LASER TURP (TRANSURETHRAL RESECTION OF PROSTATE  04/27/2017   KNEE ARTHROPLASTY     LEFT HEART CATH AND CORONARY ANGIOGRAPHY N/A 10/08/2019   Procedure: LEFT HEART CATH AND CORONARY ANGIOGRAPHY;  Surgeon: Wonda Sharper, MD;  Location: [facility] INVASIVE CV LAB;  Service: Cardiovascular;  Laterality: N/A;   QUADRICEPS TENDON REPAIR Bilateral 06/15/2017   Procedure: REPAIR QUADRICEP TENDON;  Surgeon: Addie Cordella Glendia, MD;  Location: [facility] OR;  Service:  Orthopedics;  Laterality: Bilateral;   SHOULDER SURGERY Right    TEE WITHOUT CARDIOVERSION N/A 07/03/2022   Procedure: TRANSESOPHAGEAL ECHOCARDIOGRAM (TEE);  Surgeon: Delford Maude BROCKS, MD;  Location: [facility] ENDOSCOPY;  Service: Cardiovascular;  Laterality: N/A;   TRANSCATHETER AORTIC VALVE REPLACEMENT, TRANSFEMORAL  12/23/2019   TRANSCATHETER AORTIC VALVE REPLACEMENT, TRANSFEMORAL N/A 12/23/2019   Procedure: TRANSCATHETER AORTIC VALVE REPLACEMENT, TRANSFEMORAL;  Surgeon: Wonda Sharper, MD;  Location: [facility] OR;  Service: Open Heart Surgery;  Laterality: N/A;   VASCULAR SURGERY     Patient Active Problem List   Diagnosis Date Noted   Hypertension    Severe aortic stenosis 10/08/2019   Candidiasis    Abnormality of gait    Hypoalbuminemia due to protein-calorie malnutrition [facility])    History of gout    Sleep disturbance    Constipation    Rupture of quadriceps tendon, right, sequela 06/19/2017   Quadriceps tendon rupture, left, sequela 06/19/2017   Acute blood loss anemia 06/19/2017   Fall    History of subdural hematoma    Post-operative pain    Recurrent falls    Quadriceps tendon rupture 06/15/2017   Primary osteoarthritis of both knees 02/28/2017   Primary osteoarthritis of both hands 02/28/2017   Uricacidemia 02/28/2017   Pain in joint of left  knee 02/07/2017   Idiopathic chronic gout, unspecified site, without tophus (tophi) 11/29/2016   HEMATURIA UNSPECIFIED 01/25/2009   Abdominal pain 01/25/2009   XERODERMA 03/10/2008   UNS ADVRS EFF UNS RX MEDICINAL&BIOLOGICAL SBSTNC 03/10/2008   ADJUSTMENT REACTION WITH PHYSICAL SYMPTOMS 02/14/2008   MUSCLE SPASM 02/14/2008   ACUTE PROSTATITIS 12/16/2007   BREAST LUMP OR MASS, RIGHT 07/12/2007   H/O: RCT (rotator cuff tear) 01/13/2007   GOUT 01/08/2007    PCP: Birda Ahumada, MD  REFERRING PROVIDER: Addie Cordella Hamilton, MD  REFERRING DIAG: [phone] (ICD-10-CM) - Chronic right shoulder painM19.011 (ICD-10-CM) - Primary  osteoarthritis, right shoulder  THERAPY DIAG:  Muscle weakness (generalized)  Stiffness of right shoulder, not elsewhere classified  Chronic right shoulder pain  Abnormal posture  Rationale for Evaluation and Treatment: Rehabilitation  ONSET DATE: referral 07/30/2024, after trigger finger release on 8/72025  SUBJECTIVE:                                                                                                                                                                                      SUBJECTIVE STATEMENT: Patient is a 73 y.o. male who arrives to PT with Rt chronic shoulder pain. Patient has a history of arthritis in his Rt shoulder that may need replacement surgery in the future. He has been able to hold off the need for surgery with exercises for strength but has not been able to exercise due to trigger finger issues including surgery. Patient had surgery to release trigger finger and reports shoulder pain started after that. Patient had an injection that helped minimize his pain but is worried about weakness, instability, and overall function of his Rt shoulder.  Hand dominance: Right  PERTINENT HISTORY: polio, RUE trigger finger release 07/24/2024,  aortic valve replacement 2021, bil quad tendorn repair 2018 w/left reconstruction 2020, OA, gout, CAD, skin & prostate CA, right breast lump or mass, HTN, SDH with surgery 2016, Thoracic ascending aortic aneurysm, vertebral artery aneurysm, knee arthroplasty  DIAGNOSTIC FINDINGS: RIGHT SHOULDER - 2+ VIEW  IMPRESSION: 1. Advanced glenohumeral osteoarthritis. 2. Soft tissue calcifications in the region of the rotator cuff insertion, can be seen with calcific tendinopathy.  PAIN:  Are you having pain? Yes: NPRS scale: 2-3/10 Pain location: lateral shoulder, posterior, triceps  Pain description: achy and sharp, random Aggravating factors: movements Relieving factors: rest, heat, ice, medication  PRECAUTIONS: None  RED  FLAGS: None   WEIGHT BEARING RESTRICTIONS: No  FALLS:  Has patient fallen in last 6 months? No  LIVING ENVIRONMENT: Lives with: lives with partner Lives in: House Stairs: Yes: Internal: 17 steps; on left going up and External: 2 steps; none (office & gym  are upstairs;  master bedroom & bath on main level) Has following equipment at home: None  OCCUPATION: part-time consulting  PLOF: Independent  PATIENT GOALS: strengthening  NEXT MD VISIT: tbd  OBJECTIVE:  Note: Objective measures were completed at Evaluation unless otherwise noted.  Patient-Specific Activity Scoring Scheme  0 represents "unable to perform." 10 represents "able to perform at prior level. 0 1 2 3 4 5 6 7 8 9  10 (Date and Score)  Activity Eval     1. exercising  3    2. lifting 3    3. yardwork 0   4.    5.    Score 2    Total score = sum of the activity scores/number of activities Minimum detectable change (90%CI) for average score = 2 points Minimum detectable change (90%CI) for single activity score = 3 points  COGNITION: Overall cognitive status: Within functional limits for tasks assessed     SENSATION: WFL  POSTURE: round shoulder, forward head   UPPER EXTREMITY ROM:   ROM Right eval  Shoulder flexion Seated P: 142* painful A: 132*  Shoulder extension Standing P: 38* painful A: 36*  Shoulder abduction Seated P: 57* painful A: 43* painful  Shoulder horizontal adduction Supine: A: 12*  Shoulder internal rotation Supine A: 52* painful P: 57* painful  Shoulder external rotation Supine P: 37* painful Sidelying A:  painful  Elbow flexion   Elbow extension   Wrist flexion   Wrist extension   Wrist ulnar deviation   Wrist radial deviation   Wrist pronation   Wrist supination   (Blank rows = not tested)  UPPER EXTREMITY MMT:  MMT Right eval Left eval  Shoulder flexion 3-/5   Shoulder extension    Shoulder abduction 3-/5   Shoulder adduction    Shoulder  internal rotation 3-/5   Shoulder external rotation 3-/5   Middle trapezius    Lower trapezius    Elbow flexion    Elbow extension    Wrist flexion    Wrist extension    Wrist ulnar deviation    Wrist radial deviation    Wrist pronation    Wrist supination    Grip strength (lbs)    (Blank rows = not tested)  SHOULDER SPECIAL TESTS: not tested  JOINT MOBILITY TESTING:  Not tested  PALPATION:  Not tested  TODAY'S TREATMENT:                                                                                                       DATE: 08/14/2024 Therapeutic Exercise: HEP instruction/performance c cues for techniques, handout provided.  Trial set performed of each for comprehension and symptom assessment.  See below for exercise list   PATIENT EDUCATION: Education details: HEP, POC Person educated: Patient Education method: Explanation, Demonstration, Verbal cues, and Handouts Education comprehension: verbalized understanding, returned demonstration, and verbal cues required  HOME EXERCISE PROGRAM: Access Code: [email or web address] URL: [email or web address] Date: 08/14/2024 Prepared by: Ismael Theophilus Stallion  Exercises - Isometric Shoulder Flexion at Wall  - 1-2 x daily - 7 x weekly -  2 sets - 10 reps - 5 seconds hold - Isometric Shoulder Extension at Wall  - 1-2 x daily - 7 x weekly - 2 sets - 10 reps - 5 seconds hold - Isometric Shoulder Abduction at Wall  - 1-2 x daily - 7 x weekly - 2 sets - 10 reps - 5 seconds hold - Isometric Shoulder Adduction  - 1-2 x daily - 7 x weekly - 2 sets - 10 reps - 5 seconds hold - Standing Isometric Shoulder Internal Rotation at Doorway  - 1-2 x daily - 7 x weekly - 2 sets - 10 reps - 5 seconds hold - Standing Isometric Shoulder External Rotation with Doorway  - 1-2 x daily - 7 x weekly - 2 sets - 10 reps - 5 seconds hold  ASSESSMENT:  CLINICAL IMPRESSION: Patient is a 73 y.o. who comes to clinic with complaints of dominant Rt  shoulder pain with mobility, strength and movement coordination deficits that impair their ability to perform usual daily and recreational functional activities without increase difficulty/symptoms at this time. Patient had end range pain especially eccentric with AROM. Patient to benefit from skilled PT services to address impairments and limitations to improve to previous level of function without restriction secondary to condition.   OBJECTIVE IMPAIRMENTS: decreased activity tolerance, decreased coordination, decreased endurance, decreased mobility, decreased ROM, decreased strength, impaired flexibility, impaired UE functional use, postural dysfunction, and pain.   ACTIVITY LIMITATIONS: carrying, lifting, bed mobility, bathing, dressing, reach over head, and hygiene/grooming  PARTICIPATION LIMITATIONS: meal prep, cleaning, community activity, and yard work  PERSONAL FACTORS: Past/current experiences, Time since onset of injury/illness/exacerbation, and 3+ comorbidities: see PMH are also affecting patient's functional outcome.   REHAB POTENTIAL: Good  CLINICAL DECISION MAKING: Evolving/moderate complexity  EVALUATION COMPLEXITY: Moderate   GOALS: Goals reviewed with patient? Yes  SHORT TERM GOALS: (target date for Short term goals are 3 weeks: 09/04/2024)  1.Patient will demonstrate independent use of home exercise program to maintain progress from in clinic treatments. Baseline: SEE OBJECTIVE DATA Goal status: INITIAL  LONG TERM GOALS: (target dates for all long term goals are 8 weeks: 10/09/2024)   1. Patient will demonstrate/report at worst less than or equal to 1/10 to facilitate minimal limitation in daily activity secondary to pain symptoms. Baseline: SEE OBJECTIVE DATA Goal status: INITIAL   2. Patient will demonstrate independent use of home exercise program to facilitate ability to maintain/progress functional gains from skilled physical therapy services. Baseline: SEE  OBJECTIVE DATA Goal status: INITIAL   3.  Patient reports Patient-Specific Activity Score improved to >/= 5 to indicate improvement in functional activities.  Baseline: SEE OBJECTIVE DATA Goal status: INITIAL   4.  Patient will demonstrate Rt shoulder MMT >/=4/5 throughout to facilitate lifting, reaching, carrying at [facility] in daily activity.  Baseline: SEE OBJECTIVE DATA Goal status: INITIAL   5.  Patient will demonstrate Rt shoulder AROM WFL s symptoms to facilitate usual overhead reaching, self care, dressing at PLOF.  Baseline: SEE OBJECTIVE DATA Goal status: INITIAL   PLAN: PT FREQUENCY: 2x/week  PT DURATION: 8 weeks  PLANNED INTERVENTIONS: 97164- PT Re-evaluation, 97750- Physical Performance Testing, 97110-Therapeutic exercises, 97530- Therapeutic activity, 97112- Neuromuscular re-education, 97535- Self Care, [id number]- Manual therapy, Patient/Family education, and Joint mobilization  PLAN FOR NEXT SESSION: review HEP / coordinate with OT certified hand therapist, continue isometrics due to hand, prone strengthening, 2 weeks for theraband    Ismael Nap, Student-PT 08/14/2024, 4:09 PM  This entire session of physical therapy was  performed under the direct supervision of PT signing evaluation /treatment. PT reviewed note and agrees.   Grayce Spatz, PT, DPT 08/14/2024, 8:49 PM

## 2024-08-25 ENCOUNTER — Ambulatory Visit (INDEPENDENT_AMBULATORY_CARE_PROVIDER_SITE_OTHER): Admitting: Physical Therapy

## 2024-08-25 ENCOUNTER — Encounter: Payer: Self-pay | Admitting: Physical Therapy

## 2024-08-25 DIAGNOSIS — G8929 Other chronic pain: Secondary | ICD-10-CM

## 2024-08-25 DIAGNOSIS — R600 Localized edema: Secondary | ICD-10-CM

## 2024-08-25 DIAGNOSIS — M6281 Muscle weakness (generalized): Secondary | ICD-10-CM

## 2024-08-25 DIAGNOSIS — M25511 Pain in right shoulder: Secondary | ICD-10-CM | POA: Diagnosis not present

## 2024-08-25 DIAGNOSIS — M25611 Stiffness of right shoulder, not elsewhere classified: Secondary | ICD-10-CM | POA: Diagnosis not present

## 2024-08-25 NOTE — Therapy (Signed)
 OUTPATIENT OCCUPATIONAL THERAPY TREATMENT NOTE Patient Name: Jose [name] MRN: [id number] DOB:03/03/1951, 73 y.o., male Today's Date: 08/26/2024  REFERRING PROVIDER: Arlinda Buster, MD   END OF SESSION:   Past Medical History:  Diagnosis Date   Arthritis    R shoulder, great toes, hands- has gout & has injections in knees with Dr. [name]    Bicuspid aortic valve with ascending aorta 4.0 to 4.5 cm in diameter    CAD (coronary artery disease)    Cancer (HCC)    skin ca-    GERD (gastroesophageal reflux disease)    Headache    History of hiatal hernia    Hypertension    Prostate cancer (HCC)    S/P TAVR (transcatheter aortic valve replacement)    SDH (subdural hematoma) (HCC)    Severe aortic stenosis    Thoracic ascending aortic aneurysm (HCC)    Vertebral artery aneurysm [facility])    Past Surgical History:  Procedure Laterality Date   BRAIN SURGERY  2016   evacuation of SDH   CARDIAC CATHETERIZATION     CARPAL TUNNEL RELEASE Bilateral    embolization of left vertebral artery aneurysm with pipeline/coil  10/31/2022   GREEN LIGHT LASER TURP (TRANSURETHRAL RESECTION OF PROSTATE  04/27/2017   KNEE ARTHROPLASTY     LEFT HEART CATH AND CORONARY ANGIOGRAPHY N/A 10/08/2019   Procedure: LEFT HEART CATH AND CORONARY ANGIOGRAPHY;  Surgeon: Wonda Sharper, MD;  Location: [facility] INVASIVE CV LAB;  Service: Cardiovascular;  Laterality: N/A;   QUADRICEPS TENDON REPAIR Bilateral 06/15/2017   Procedure: REPAIR QUADRICEP TENDON;  Surgeon: Addie Cordella Glendia, MD;  Location: [facility] OR;  Service: Orthopedics;  Laterality: Bilateral;   SHOULDER SURGERY Right    TEE WITHOUT CARDIOVERSION N/A 07/03/2022   Procedure: TRANSESOPHAGEAL ECHOCARDIOGRAM (TEE);  Surgeon: Delford Maude BROCKS, MD;  Location: [name] ENDOSCOPY;  Service: Cardiovascular;  Laterality: N/A;   TRANSCATHETER AORTIC VALVE REPLACEMENT, TRANSFEMORAL  12/23/2019   TRANSCATHETER AORTIC VALVE REPLACEMENT, TRANSFEMORAL N/A 12/23/2019    Procedure: TRANSCATHETER AORTIC VALVE REPLACEMENT, TRANSFEMORAL;  Surgeon: Wonda Sharper, MD;  Location: [facility] OR;  Service: Open Heart Surgery;  Laterality: N/A;   VASCULAR SURGERY     Patient Active Problem List   Diagnosis Date Noted   Hypertension    Severe aortic stenosis 10/08/2019   Candidiasis    Abnormality of gait    Hypoalbuminemia due to protein-calorie malnutrition [facility])    History of gout    Sleep disturbance    Constipation    Rupture of quadriceps tendon, right, sequela 06/19/2017   Quadriceps tendon rupture, left, sequela 06/19/2017   Acute blood loss anemia 06/19/2017   Fall    History of subdural hematoma    Post-operative pain    Recurrent falls    Quadriceps tendon rupture 06/15/2017   Primary osteoarthritis of both knees 02/28/2017   Primary osteoarthritis of both hands 02/28/2017   Uricacidemia 02/28/2017   Pain in joint of left knee 02/07/2017   Idiopathic chronic gout, unspecified site, without tophus (tophi) 11/29/2016   HEMATURIA UNSPECIFIED 01/25/2009   Abdominal pain 01/25/2009   XERODERMA 03/10/2008   UNS ADVRS EFF UNS RX MEDICINAL&BIOLOGICAL SBSTNC 03/10/2008   ADJUSTMENT REACTION WITH PHYSICAL SYMPTOMS 02/14/2008   MUSCLE SPASM 02/14/2008   ACUTE PROSTATITIS 12/16/2007   BREAST LUMP OR MASS, RIGHT 07/12/2007   H/O: RCT (rotator cuff tear) 01/13/2007   GOUT 01/08/2007     ONSET DATE:  DOS 07/24/24  REFERRING DIAG:  F34.668 (ICD-10-CM) - Trigger finger, right middle finger  M65.341 (ICD-10-CM) - Trigger finger, right ring finger    THERAPY DIAG:     Muscle weakness (generalized)  Other lack of coordination  Stiffness of right hand, not elsewhere classified  Pain in right wrist  Rationale for Evaluation and Treatment: Rehabilitation  PERTINENT HISTORY:  The patient states hx of triggering and pain in their hand and subsequent surgical release. The patient states having some stiffness, pain, decreased ability to make a fist and  perform I/ADLs.     PRECAUTIONS: None relative to this evaluation and episode of care.   RED FLAGS: None   WEIGHT BEARING RESTRICTIONS: Yes: caution with weightbearing for the next 4-6 weeks, recommended less than 5lbs for next 2 weeks with affected hand   SUBJECTIVE:   SUBJECTIVE STATEMENT: The patient is now approx 4 weeks s/p Rt hand RF, MF finger TFRs.  He states only mildly sore in sx area and occasionally in 1st dorsal compartment.       PAIN:  Are you having pain?  None at rest now in thumb/TFR, etc.    PATIENT GOALS: To improve motion, function with affected surgical hand  NEXT MD VISIT: PRN    OBJECTIVE MEASURES:   ADLs: Overall ADLs: States decreased ability to grab, hold household objects, pain and difficulty to open containers, perform FMS tasks (manipulate fasteners on clothing).     UPPER EXTREMITY ROM:     A/ROM Right eval Rt 08/26/24  Wrist flexion Approx 60 49  Wrist extension Approx 55 69  (Blank rows = not tested)                    Hand A/ROM Right eval Rt 08/26/24  Full Fist Ability (or Gap to Distal Palmar Crease) Fingers loosely touch palm, able to extend with some minimal lag at middle finger PIP joint Full fist now   Thumb Opposition  (Kapandji Scale)  8/10, WFL 9/10  (Blank rows = not tested)   HAND STRENGTH & FUNCTION: 08/26/24 grip strength Rt: 40# no pain    Eval: Observed weakness in affected right hand/arm, grossly 3-/5 MMT, but specific gripping and resistance training contraindicated today.  Per protocols we will begin light grip training in about 2 weeks.   SENSATION: Eval:  Light touch mildly diminished especially through sx area. Expected to improve with HEP and recommendations.   EDEMA:   Eval:  Mildly swollen in surgical hand today.  Expected to improve with HEP and recommendations.   OBSERVATIONS:   Eval: Surgical site is clean and no overt signs of infection, no drainage, signs of dehiscence, etc.  Tenderness and  swelling is within normal limits for post-op timeframe.     TODAY'S TREATMENT:  08/26/24: He starts with active range of motion for exercise as well as new measures which shows he can now make a good fist, his wrist is moving better.  As he is now 4 weeks or more postop, he is safe to start doing gripping and squeezing activities which he was educated on how to do today with therapy putty.  We review his stretches while he is on moist heat, then he performs the exercises and activities listed below with light tension to medium tension and no added pain.  OT is careful to ensure that none of these things flareup his de Quervain's tenosynovitis.  States understanding all directions, not having much pain, but he also request to follow-up in 2 weeks to ensure that his grip strength is improving.  Exercises/activities performed today - Wrist Prayer Stretch  - 3-4 x daily - 3 reps - 15 seconds hold - Tendon Glides  - 3-4 x daily - 3 reps - 3 second hold - Full finger stretches   - 3-4 x daily - 3 reps - 15 second hold - Push your knuckles down, pull back hand to feel a stretch  - 3-4 x daily - 3 reps - 15 second hold - Stretch thumb toward base of small finger (put hand in LAP)  - 2-3 x daily - 3-5 reps - 15 sec hold - Full Fist  - 2-3 x daily - 3 reps - 10 sec hold - Seated Claw Fist with Putty  - 3 x daily - 3 reps - 10 sec hold - Duck Mouth Strength  - 3 x daily - 3 reps - 10 sec hold - Finger Extension Pizza!   - 2-3 x daily - 3 reps - 10 sec hold - Thumb Press  - 3 x daily - 3 reps - 10 sec hold      PATIENT EDUCATION: Education details: See tx section above for details  Person educated: Patient Education method: Verbal Instruction, Teach back, Handouts  Education comprehension: States and demonstrates understanding, additional education required   HOME EXERCISE PROGRAM: Access Code: [email or web address] URL: [email or web address] Date: 08/14/2024 Prepared by: Melvenia Ada   GOALS: Goals reviewed with patient? Yes   SHORT TERM GOALS: (STG required if POC>30 days) Target Date: 09/05/24  1.  Pt will demo/state understanding of initial HEP and therapist recommendations to improve pain levels, improve motions and ability and eventually return to normal activities.   Goal status: 08/14/24 MET  2.  In 2 weeks, patient will tolerate gripping and pinching with therapy putty and learned a home exercise program to improve his ability and status for IADLs.  GOAL STATUS: Initial    ASSESSMENT:  CLINICAL IMPRESSION: 08/26/24: Doing very well and improving.  Not having any significant pain and definitely no triggering.  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  He wishes to follow-up again in 2 weeks at which point he will need a progress note and likely discharge therapy.   PLAN:  OT FREQUENCY: Up to 1 time a week  OT DURATION: 4 total sessions as needed for 3 total weeks through 09/05/2024  PLANNED INTERVENTIONS: self care/ADL training, therapeutic exercise, therapeutic activity, neuromuscular re-education, manual therapy, scar mobilization, passive range of motion, splinting, ultrasound, fluidotherapy, compression bandaging, moist heat, cryotherapy, contrast bath, patient/family education, energy conservation, coping strategies training, and Re-evaluation  RECOMMENDED OTHER SERVICES: Is in physical therapy for recent right shoulder pain.  PT is working with OT to ensure no exacerbation of the right hand  CONSULTED AND AGREED WITH PLAN OF CARE: Patient  PLAN FOR NEXT SESSION:   Check status, perform progress note, check new therapy putty strengthening, likely discharge if all goals are met  Melvenia Ada, OTR/L, CHT 08/26/2024, 12:40 PM

## 2024-08-25 NOTE — Therapy (Signed)
 OUTPATIENT PHYSICAL THERAPY UPPER EXTREMITY TREATMENT   Patient Name: Jose [name] MRN: [id number] DOB:[birth year], 73 y.o., male Today's Date: 08/25/2024  END OF SESSION:  PT End of Session - 08/25/24 0932     Visit Number 2    Number of Visits 16    Date for PT Re-Evaluation 10/09/24    Authorization Type Medicare A/B & BCBS supp    Progress Note Due on Visit 10    PT Start Time 0930    PT Stop Time 1011    PT Time Calculation (min) 41 min    Activity Tolerance Patient tolerated treatment well;Patient limited by pain    Behavior During Therapy WFL for tasks assessed/performed           Past Medical History:  Diagnosis Date   Arthritis    R shoulder, great toes, hands- has gout & has injections in knees with Dr. [name]    Bicuspid aortic valve with ascending aorta 4.0 to 4.5 cm in diameter    CAD (coronary artery disease)    Cancer (HCC)    skin ca-    GERD (gastroesophageal reflux disease)    Headache    History of hiatal hernia    Hypertension    Prostate cancer (HCC)    S/P TAVR (transcatheter aortic valve replacement)    SDH (subdural hematoma) (HCC)    Severe aortic stenosis    Thoracic ascending aortic aneurysm (HCC)    Vertebral artery aneurysm (HCC)    Past Surgical History:  Procedure Laterality Date   BRAIN SURGERY  2016   evacuation of SDH   CARDIAC CATHETERIZATION     CARPAL TUNNEL RELEASE Bilateral    embolization of left vertebral artery aneurysm with pipeline/coil  10/31/2022   GREEN LIGHT LASER TURP (TRANSURETHRAL RESECTION OF PROSTATE  04/27/2017   KNEE ARTHROPLASTY     LEFT HEART CATH AND CORONARY ANGIOGRAPHY N/A 10/08/2019   Procedure: LEFT HEART CATH AND CORONARY ANGIOGRAPHY;  Surgeon: Wonda Sharper, MD;  Location: [facility] INVASIVE CV LAB;  Service: Cardiovascular;  Laterality: N/A;   QUADRICEPS TENDON REPAIR Bilateral 06/15/2017   Procedure: REPAIR QUADRICEP TENDON;  Surgeon: Addie Cordella Glendia, MD;  Location: [facility] OR;  Service:  Orthopedics;  Laterality: Bilateral;   SHOULDER SURGERY Right    TEE WITHOUT CARDIOVERSION N/A 07/03/2022   Procedure: TRANSESOPHAGEAL ECHOCARDIOGRAM (TEE);  Surgeon: Delford Maude BROCKS, MD;  Location: [facility] ENDOSCOPY;  Service: Cardiovascular;  Laterality: N/A;   TRANSCATHETER AORTIC VALVE REPLACEMENT, TRANSFEMORAL  12/23/2019   TRANSCATHETER AORTIC VALVE REPLACEMENT, TRANSFEMORAL N/A 12/23/2019   Procedure: TRANSCATHETER AORTIC VALVE REPLACEMENT, TRANSFEMORAL;  Surgeon: Wonda Sharper, MD;  Location: [facility] OR;  Service: Open Heart Surgery;  Laterality: N/A;   VASCULAR SURGERY     Patient Active Problem List   Diagnosis Date Noted   Hypertension    Severe aortic stenosis 10/08/2019   Candidiasis    Abnormality of gait    Hypoalbuminemia due to protein-calorie malnutrition [facility])    History of gout    Sleep disturbance    Constipation    Rupture of quadriceps tendon, right, sequela 06/19/2017   Quadriceps tendon rupture, left, sequela 06/19/2017   Acute blood loss anemia 06/19/2017   Fall    History of subdural hematoma    Post-operative pain    Recurrent falls    Quadriceps tendon rupture 06/15/2017   Primary osteoarthritis of both knees 02/28/2017   Primary osteoarthritis of both hands 02/28/2017   Uricacidemia 02/28/2017   Pain in joint of  left knee 02/07/2017   Idiopathic chronic gout, unspecified site, without tophus (tophi) 11/29/2016   HEMATURIA UNSPECIFIED 01/25/2009   Abdominal pain 01/25/2009   XERODERMA 03/10/2008   UNS ADVRS EFF UNS RX MEDICINAL&BIOLOGICAL SBSTNC 03/10/2008   ADJUSTMENT REACTION WITH PHYSICAL SYMPTOMS 02/14/2008   MUSCLE SPASM 02/14/2008   ACUTE PROSTATITIS 12/16/2007   BREAST LUMP OR MASS, RIGHT 07/12/2007   H/O: RCT (rotator cuff tear) 01/13/2007   GOUT 01/08/2007    PCP: Birda Ahumada, MD  REFERRING PROVIDER: Arlinda Buster, MD  REFERRING DIAG: M25.511,G89.29 (ICD-10-CM) - Chronic right shoulder painM19.011 (ICD-10-CM) - Primary  osteoarthritis, right shoulder  THERAPY DIAG:  Muscle weakness (generalized)  Stiffness of right shoulder, not elsewhere classified  Localized edema  Chronic right shoulder pain  Rationale for Evaluation and Treatment: Rehabilitation  ONSET DATE: referral 07/30/2024, after trigger finger release on 8/72025  SUBJECTIVE:                                                                                                                                                                                      SUBJECTIVE STATEMENT: His shoulder is better.  If he lays on right side, he feels the shoulder capsule moving.   Hand dominance: Right  PERTINENT HISTORY: polio, RUE trigger finger release 07/24/2024,  aortic valve replacement 2021, bil quad tendorn repair 2018 w/left reconstruction 2020, OA, gout, CAD, skin & prostate CA, right breast lump or mass, HTN, SDH with surgery 2016, Thoracic ascending aortic aneurysm, vertebral artery aneurysm, knee arthroplasty  DIAGNOSTIC FINDINGS: RIGHT SHOULDER - 2+ VIEW  IMPRESSION: 1. Advanced glenohumeral osteoarthritis. 2. Soft tissue calcifications in the region of the rotator cuff insertion, can be seen with calcific tendinopathy.  PAIN:  Are you having pain? Yes: NPRS scale: since PT up to 7/10 when laying on right shoulder and occasionally during day.   Pain location: lateral shoulder, posterior, triceps  Pain description: achy and sharp, random Aggravating factors: movements Relieving factors: rest, heat, ice, medication  PRECAUTIONS: None  RED FLAGS: None   WEIGHT BEARING RESTRICTIONS: No  FALLS:  Has patient fallen in last 6 months? No  LIVING ENVIRONMENT: Lives with: lives with partner Lives in: House Stairs: Yes: Internal: 17 steps; on left going up and External: 2 steps; none (office & gym are upstairs;  master bedroom & bath on main level) Has following equipment at home: None  OCCUPATION: part-time consulting  PLOF:  Independent  PATIENT GOALS: strengthening  NEXT MD VISIT: tbd  OBJECTIVE:  Note: Objective measures were completed at Evaluation unless otherwise noted.  Patient-Specific Activity Scoring Scheme  0 represents "unable to perform." 10 represents "able to perform at prior level. 0  1 2 3 4 5 6 7 8 9 10  (Date and Score)  Activity Eval     1. exercising  3    2. lifting 3    3. yardwork 0   4.    5.    Score 2    Total score = sum of the activity scores/number of activities Minimum detectable change (90%CI) for average score = 2 points Minimum detectable change (90%CI) for single activity score = 3 points  COGNITION: Overall cognitive status: Within functional limits for tasks assessed     SENSATION: WFL  POSTURE: round shoulder, forward head   UPPER EXTREMITY ROM:   ROM Right eval  Shoulder flexion Seated P: 142* painful A: 132*  Shoulder extension Standing P: 38* painful A: 36*  Shoulder abduction Seated P: 57* painful A: 43* painful  Shoulder horizontal adduction Supine: A: 12*  Shoulder internal rotation Supine A: 52* painful P: 57* painful  Shoulder external rotation Supine P: 37* painful Sidelying A:  painful  Elbow flexion   Elbow extension   Wrist flexion   Wrist extension   Wrist ulnar deviation   Wrist radial deviation   Wrist pronation   Wrist supination   (Blank rows = not tested)  UPPER EXTREMITY MMT:  MMT Right eval Left eval  Shoulder flexion 3-/5   Shoulder extension    Shoulder abduction 3-/5   Shoulder adduction    Shoulder internal rotation 3-/5   Shoulder external rotation 3-/5   Middle trapezius    Lower trapezius    Elbow flexion    Elbow extension    Wrist flexion    Wrist extension    Wrist ulnar deviation    Wrist radial deviation    Wrist pronation    Wrist supination    Grip strength (lbs)    (Blank rows = not tested)  SHOULDER SPECIAL TESTS: not tested  JOINT MOBILITY TESTING:  Not  tested  PALPATION:  Not tested  TODAY'S TREATMENT:                                                                                                       DATE: 08/25/2024 Therapeutic Exercise:  AAROM seated pulley flexion 2 minutes and abduction 2 minutes.  PT recommending for flexibility and using muscles in higher ranges. PT recommending seated on chair not Swiss ball like previously. So he does not over use hand if he has a balance loss.  Pt verbalized understanding.   Therapeutic Activities: Standing chin tucks / cervical retraction with mirror for visual feedback and PT cues 10 reps UE ranger for overhead reaching motion flexion 15 reps, scaption 15 reps. PT cues for motion. Shoulder ladder abduction 15 reps with PT cues for proper motion like reaching upward to side 15 reps.  Supine pronation / supination with shoulder IR / ER with straight arm 5 reps, then shoulder protraction with hand pronated / IR 5 reps, shoulder protraction with hand supinated / ER 5 reps.  Pt performed 1st set at 45* abduction, 2nd set at 60* abduction, 3rd set at  80* abduction.   Self-Care: PT demo & verbal cues on using pillows to position UEs in supine, left side lying and right side lying.  Pt return demo & verbalized understanding.  He reported RUE shoulder felt better.     TREATMENT:                                                                                                       DATE: 08/14/2024 Therapeutic Exercise: HEP instruction/performance c cues for techniques, handout provided.  Trial set performed of each for comprehension and symptom assessment.  See below for exercise list   PATIENT EDUCATION: Education details: HEP, POC Person educated: Patient Education method: Explanation, Demonstration, Verbal cues, and Handouts Education comprehension: verbalized understanding, returned demonstration, and verbal cues required  HOME EXERCISE PROGRAM: Access Code: [email or web address] URL:  [email or web address] Date: 08/14/2024 Prepared by: Ismael Theophilus Stallion  Exercises - Isometric Shoulder Flexion at Wall  - 1-2 x daily - 7 x weekly - 2 sets - 10 reps - 5 seconds hold - Isometric Shoulder Extension at Wall  - 1-2 x daily - 7 x weekly - 2 sets - 10 reps - 5 seconds hold - Isometric Shoulder Abduction at Wall  - 1-2 x daily - 7 x weekly - 2 sets - 10 reps - 5 seconds hold - Isometric Shoulder Adduction  - 1-2 x daily - 7 x weekly - 2 sets - 10 reps - 5 seconds hold - Standing Isometric Shoulder Internal Rotation at Doorway  - 1-2 x daily - 7 x weekly - 2 sets - 10 reps - 5 seconds hold - Standing Isometric Shoulder External Rotation with Doorway  - 1-2 x daily - 7 x weekly - 2 sets - 10 reps - 5 seconds hold  ASSESSMENT:  CLINICAL IMPRESSION: Patient improved shoulder mobility and function with PT instruction and repetition.  He appears to understand PT recommendations for positioning in bed which should decrease pain that he is reporting.  Patient to benefit from skilled PT services to address impairments and limitations to improve to previous level of function without restriction secondary to condition.   OBJECTIVE IMPAIRMENTS: decreased activity tolerance, decreased coordination, decreased endurance, decreased mobility, decreased ROM, decreased strength, impaired flexibility, impaired UE functional use, postural dysfunction, and pain.   ACTIVITY LIMITATIONS: carrying, lifting, bed mobility, bathing, dressing, reach over head, and hygiene/grooming  PARTICIPATION LIMITATIONS: meal prep, cleaning, community activity, and yard work  PERSONAL FACTORS: Past/current experiences, Time since onset of injury/illness/exacerbation, and 3+ comorbidities: see PMH are also affecting patient's functional outcome.   REHAB POTENTIAL: Good  CLINICAL DECISION MAKING: Evolving/moderate complexity  EVALUATION COMPLEXITY: Moderate   GOALS: Goals reviewed with  patient? Yes  SHORT TERM GOALS: (target date for Short term goals are 3 weeks: 09/04/2024)  1.Patient will demonstrate independent use of home exercise program to maintain progress from in clinic treatments. Baseline: SEE OBJECTIVE DATA Goal status:  Ongoing   08/25/2024  LONG TERM GOALS: (target dates for all long term goals are 8 weeks: 10/09/2024)   1. Patient will demonstrate/report at worst less than  or equal to 1/10 to facilitate minimal limitation in daily activity secondary to pain symptoms. Baseline: SEE OBJECTIVE DATA Goal status: Ongoing   08/25/2024   2. Patient will demonstrate independent use of home exercise program to facilitate ability to maintain/progress functional gains from skilled physical therapy services. Baseline: SEE OBJECTIVE DATA Goal status: Ongoing   08/25/2024   3.  Patient reports Patient-Specific Activity Score improved to >/= 5 to indicate improvement in functional activities.  Baseline: SEE OBJECTIVE DATA Goal status: Ongoing   08/25/2024   4.  Patient will demonstrate Rt shoulder MMT >/=4/5 throughout to facilitate lifting, reaching, carrying at [facility] in daily activity.  Baseline: SEE OBJECTIVE DATA Goal status: Ongoing   08/25/2024   5.  Patient will demonstrate Rt shoulder AROM WFL s symptoms to facilitate usual overhead reaching, self care, dressing at PLOF.  Baseline: SEE OBJECTIVE DATA Goal status: Ongoing   08/25/2024   PLAN: PT FREQUENCY: 2x/week  PT DURATION: 8 weeks  PLANNED INTERVENTIONS: 97164- PT Re-evaluation, 97750- Physical Performance Testing, 97110-Therapeutic exercises, 97530- Therapeutic activity, 97112- Neuromuscular re-education, 97535- Self Care, [id number]- Manual therapy, Patient/Family education, and Joint mobilization  PLAN FOR NEXT SESSION: check if positioning helped sleep,  continue with exercises and activities for shoulder function limiting resistance with hand for this week (check with OT / CHT before adding resistance to hand).     Grayce Spatz, PT, DPT 08/25/2024, 11:23 AM

## 2024-08-26 ENCOUNTER — Ambulatory Visit (INDEPENDENT_AMBULATORY_CARE_PROVIDER_SITE_OTHER): Admitting: Rehabilitative and Restorative Service Providers"

## 2024-08-26 ENCOUNTER — Encounter: Payer: Self-pay | Admitting: Rehabilitative and Restorative Service Providers"

## 2024-08-26 DIAGNOSIS — M25641 Stiffness of right hand, not elsewhere classified: Secondary | ICD-10-CM | POA: Diagnosis not present

## 2024-08-26 DIAGNOSIS — M6281 Muscle weakness (generalized): Secondary | ICD-10-CM

## 2024-08-26 DIAGNOSIS — R278 Other lack of coordination: Secondary | ICD-10-CM | POA: Diagnosis not present

## 2024-08-26 DIAGNOSIS — M25531 Pain in right wrist: Secondary | ICD-10-CM

## 2024-08-27 ENCOUNTER — Ambulatory Visit (INDEPENDENT_AMBULATORY_CARE_PROVIDER_SITE_OTHER): Admitting: Physical Therapy

## 2024-08-27 ENCOUNTER — Encounter: Payer: Self-pay | Admitting: Physical Therapy

## 2024-08-27 DIAGNOSIS — M6281 Muscle weakness (generalized): Secondary | ICD-10-CM | POA: Diagnosis not present

## 2024-08-27 DIAGNOSIS — R600 Localized edema: Secondary | ICD-10-CM | POA: Diagnosis not present

## 2024-08-27 DIAGNOSIS — M25611 Stiffness of right shoulder, not elsewhere classified: Secondary | ICD-10-CM

## 2024-08-27 NOTE — Therapy (Signed)
 OUTPATIENT PHYSICAL THERAPY UPPER EXTREMITY TREATMENT   Patient Name: Jose [name] MRN: [id number] DOB:02-28-1951, 73 y.o., male Today's Date: 08/27/2024  END OF SESSION:  PT End of Session - 08/27/24 1155     Visit Number 3    Number of Visits 16    Date for PT Re-Evaluation 10/09/24    Authorization Type Medicare A/B & BCBS supp    Progress Note Due on Visit 10    PT Start Time 1152    PT Stop Time 1230    PT Time Calculation (min) 38 min    Activity Tolerance Patient tolerated treatment well;Patient limited by pain    Behavior During Therapy WFL for tasks assessed/performed            Past Medical History:  Diagnosis Date   Arthritis    R shoulder, great toes, hands- has gout & has injections in knees with Dr. [name]    Bicuspid aortic valve with ascending aorta 4.0 to 4.5 cm in diameter    CAD (coronary artery disease)    Cancer (HCC)    skin ca-    GERD (gastroesophageal reflux disease)    Headache    History of hiatal hernia    Hypertension    Prostate cancer (HCC)    S/P TAVR (transcatheter aortic valve replacement)    SDH (subdural hematoma) (HCC)    Severe aortic stenosis    Thoracic ascending aortic aneurysm (HCC)    Vertebral artery aneurysm (HCC)    Past Surgical History:  Procedure Laterality Date   BRAIN SURGERY  2016   evacuation of SDH   CARDIAC CATHETERIZATION     CARPAL TUNNEL RELEASE Bilateral    embolization of left vertebral artery aneurysm with pipeline/coil  10/31/2022   GREEN LIGHT LASER TURP (TRANSURETHRAL RESECTION OF PROSTATE  04/27/2017   KNEE ARTHROPLASTY     LEFT HEART CATH AND CORONARY ANGIOGRAPHY N/A 10/08/2019   Procedure: LEFT HEART CATH AND CORONARY ANGIOGRAPHY;  Surgeon: Wonda Sharper, MD;  Location: [facility] INVASIVE CV LAB;  Service: Cardiovascular;  Laterality: N/A;   QUADRICEPS TENDON REPAIR Bilateral 06/15/2017   Procedure: REPAIR QUADRICEP TENDON;  Surgeon: Addie Cordella Glendia, MD;  Location: [facility] OR;  Service:  Orthopedics;  Laterality: Bilateral;   SHOULDER SURGERY Right    TEE WITHOUT CARDIOVERSION N/A 07/03/2022   Procedure: TRANSESOPHAGEAL ECHOCARDIOGRAM (TEE);  Surgeon: Delford Maude BROCKS, MD;  Location: [facility] ENDOSCOPY;  Service: Cardiovascular;  Laterality: N/A;   TRANSCATHETER AORTIC VALVE REPLACEMENT, TRANSFEMORAL  12/23/2019   TRANSCATHETER AORTIC VALVE REPLACEMENT, TRANSFEMORAL N/A 12/23/2019   Procedure: TRANSCATHETER AORTIC VALVE REPLACEMENT, TRANSFEMORAL;  Surgeon: Wonda Sharper, MD;  Location: [facility] OR;  Service: Open Heart Surgery;  Laterality: N/A;   VASCULAR SURGERY     Patient Active Problem List   Diagnosis Date Noted   Hypertension    Severe aortic stenosis 10/08/2019   Candidiasis    Abnormality of gait    Hypoalbuminemia due to protein-calorie malnutrition [facility])    History of gout    Sleep disturbance    Constipation    Rupture of quadriceps tendon, right, sequela 06/19/2017   Quadriceps tendon rupture, left, sequela 06/19/2017   Acute blood loss anemia 06/19/2017   Fall    History of subdural hematoma    Post-operative pain    Recurrent falls    Quadriceps tendon rupture 06/15/2017   Primary osteoarthritis of both knees 02/28/2017   Primary osteoarthritis of both hands 02/28/2017   Uricacidemia 02/28/2017   Pain in joint  of left knee 02/07/2017   Idiopathic chronic gout, unspecified site, without tophus (tophi) 11/29/2016   HEMATURIA UNSPECIFIED 01/25/2009   Abdominal pain 01/25/2009   XERODERMA 03/10/2008   UNS ADVRS EFF UNS RX MEDICINAL&BIOLOGICAL SBSTNC 03/10/2008   ADJUSTMENT REACTION WITH PHYSICAL SYMPTOMS 02/14/2008   MUSCLE SPASM 02/14/2008   ACUTE PROSTATITIS 12/16/2007   BREAST LUMP OR MASS, RIGHT 07/12/2007   H/O: RCT (rotator cuff tear) 01/13/2007   GOUT 01/08/2007    PCP: Birda Ahumada, MD  REFERRING PROVIDER: Arlinda Buster, MD  REFERRING DIAG: M25.511,G89.29 (ICD-10-CM) - Chronic right shoulder painM19.011 (ICD-10-CM) - Primary  osteoarthritis, right shoulder  THERAPY DIAG:  Muscle weakness (generalized)  Stiffness of right shoulder, not elsewhere classified  Localized edema  Rationale for Evaluation and Treatment: Rehabilitation  ONSET DATE: referral 07/30/2024, after trigger finger release on 8/72025  SUBJECTIVE:                                                                                                                                                                                      SUBJECTIVE STATEMENT: He has been doing his shoulder exercises without issues.  OT started some gripping yesterday and going okay also.  He tried positioning and helped some but when he moves to change positions it hurts.  Hand dominance: Right  PERTINENT HISTORY: polio, RUE trigger finger release 07/24/2024,  aortic valve replacement 2021, bil quad tendorn repair 2018 w/left reconstruction 2020, OA, gout, CAD, skin & prostate CA, right breast lump or mass, HTN, SDH with surgery 2016, Thoracic ascending aortic aneurysm, vertebral artery aneurysm, knee arthroplasty  DIAGNOSTIC FINDINGS: RIGHT SHOULDER - 2+ VIEW  IMPRESSION: 1. Advanced glenohumeral osteoarthritis. 2. Soft tissue calcifications in the region of the rotator cuff insertion, can be seen with calcific tendinopathy.  PAIN:  Are you having pain? Yes: NPRS scale: since PT up to 4/10 Pain location: lateral shoulder, posterior, triceps  Pain description: achy and sharp, random Aggravating factors: movements Relieving factors: rest, heat, ice, medication  PRECAUTIONS: None  RED FLAGS: None   WEIGHT BEARING RESTRICTIONS: No  FALLS:  Has patient fallen in last 6 months? No  LIVING ENVIRONMENT: Lives with: lives with partner Lives in: House Stairs: Yes: Internal: 17 steps; on left going up and External: 2 steps; none (office & gym are upstairs;  master bedroom & bath on main level) Has following equipment at home: None  OCCUPATION: part-time  consulting  PLOF: Independent  PATIENT GOALS: strengthening  NEXT MD VISIT: tbd  OBJECTIVE:  Note: Objective measures were completed at Evaluation unless otherwise noted.  Patient-Specific Activity Scoring Scheme  0 represents "unable to perform." 10 represents "able to perform at prior  level. 0 1 2 3 4 5 6 7 8 9  10 (Date and Score)  Activity Eval     1. exercising  3    2. lifting 3    3. yardwork 0   4.    5.    Score 2    Total score = sum of the activity scores/number of activities Minimum detectable change (90%CI) for average score = 2 points Minimum detectable change (90%CI) for single activity score = 3 points  COGNITION: Overall cognitive status: Within functional limits for tasks assessed     SENSATION: WFL  POSTURE: round shoulder, forward head   UPPER EXTREMITY ROM:   ROM Right eval  Shoulder flexion Seated P: 142* painful A: 132*  Shoulder extension Standing P: 38* painful A: 36*  Shoulder abduction Seated P: 57* painful A: 43* painful  Shoulder horizontal adduction Supine: A: 12*  Shoulder internal rotation Supine A: 52* painful P: 57* painful  Shoulder external rotation Supine P: 37* painful Sidelying A:  painful  Elbow flexion   Elbow extension   Wrist flexion   Wrist extension   Wrist ulnar deviation   Wrist radial deviation   Wrist pronation   Wrist supination   (Blank rows = not tested)  UPPER EXTREMITY MMT:  MMT Right eval Left eval  Shoulder flexion 3-/5   Shoulder extension    Shoulder abduction 3-/5   Shoulder adduction    Shoulder internal rotation 3-/5   Shoulder external rotation 3-/5   Middle trapezius    Lower trapezius    Elbow flexion    Elbow extension    Wrist flexion    Wrist extension    Wrist ulnar deviation    Wrist radial deviation    Wrist pronation    Wrist supination    Grip strength (lbs)    (Blank rows = not tested)  SHOULDER SPECIAL TESTS: not tested  JOINT MOBILITY TESTING:   Not tested  PALPATION:  Not tested  TODAY'S TREATMENT:                                                                                                       DATE: 08/27/2024 Therapeutic Exercise:  AAROM seated pulley flexion 2 minutes and abduction 2 minutes.  PT recommending for flexibility and using muscles in higher ranges. PT recommending seated on chair not Swiss ball like previously. So he does not over use hand if he has a balance loss.  Pt verbalized understanding.  Standing blue theraband (L4) PT cues for posture and motion including right grip:  punch / forward reach RUE only & rows BUEs 15 reps 2 sets Standing RUE reactive flex, ext, IR & ER blue theraband (L4) PT cues for posture and motion including right grip 10 reps 2 sets  PT updated HEP including HO, demo & verbal cues with above exercises.  Pt verbalized understanding.     TREATMENT:  DATE: 08/25/2024 Therapeutic Exercise:  AAROM seated pulley flexion 2 minutes and abduction 2 minutes.  PT recommending for flexibility and using muscles in higher ranges. PT recommending seated on chair not Swiss ball like previously. So he does not over use hand if he has a balance loss.  Pt verbalized understanding.   Therapeutic Activities: Standing chin tucks / cervical retraction with mirror for visual feedback and PT cues 10 reps UE ranger for overhead reaching motion flexion 15 reps, scaption 15 reps. PT cues for motion. Shoulder ladder abduction 15 reps with PT cues for proper motion like reaching upward to side 15 reps.  Supine pronation / supination with shoulder IR / ER with straight arm 5 reps, then shoulder protraction with hand pronated / IR 5 reps, shoulder protraction with hand supinated / ER 5 reps.  Pt performed 1st set at 45* abduction, 2nd set at 60* abduction, 3rd set at 80* abduction.   Self-Care: PT demo & verbal cues  on using pillows to position UEs in supine, left side lying and right side lying.  Pt return demo & verbalized understanding.  He reported RUE shoulder felt better.     TREATMENT:                                                                                                       DATE: 08/14/2024 Therapeutic Exercise: HEP instruction/performance c cues for techniques, handout provided.  Trial set performed of each for comprehension and symptom assessment.  See below for exercise list   PATIENT EDUCATION: Education details: HEP, POC Person educated: Patient Education method: Explanation, Demonstration, Verbal cues, and Handouts Education comprehension: verbalized understanding, returned demonstration, and verbal cues required  HOME EXERCISE PROGRAM: Access Code: [email or web address] URL: [email or web address] Date: 08/27/2024 Prepared by: Grayce Spatz  Exercises - Isometric Shoulder Flexion at Wall  - 1-2 x daily - 7 x weekly - 2 sets - 10 reps - 5 seconds hold - Isometric Shoulder Extension at Wall  - 1-2 x daily - 7 x weekly - 2 sets - 10 reps - 5 seconds hold - Isometric Shoulder Abduction at Wall  - 1-2 x daily - 7 x weekly - 2 sets - 10 reps - 5 seconds hold - Isometric Shoulder Adduction  - 1-2 x daily - 7 x weekly - 2 sets - 10 reps - 5 seconds hold - Standing Isometric Shoulder Internal Rotation at Doorway  - 1-2 x daily - 7 x weekly - 2 sets - 10 reps - 5 seconds hold - Standing Isometric Shoulder External Rotation with Doorway  - 1-2 x daily - 7 x weekly - 2 sets - 10 reps - 5 seconds hold - Standing Row with Anchored Resistance  - 1 x daily - 4-7 x weekly - 2 sets - 15 reps - 5 seconds hold - forward reach  - 1 x daily - 4-7 x weekly - 2 sets - 15 reps - 5 seconds hold - Shoulder External Rotation Walkouts with Anchored Resistance  - 1 x daily - 4-7 x weekly - 2 sets - 10  reps - 5 seconds hold - Shoulder Internal Rotation Reactive Isometrics  - 1 x daily - 4-7 x weekly - 2  sets - 10 reps - 5 seconds hold - walking balance with band behind you  - 1 x daily - 4-7 x weekly - 2 sets - 10 reps - 5 seconds hold - Standing Shoulder Row Reactive Isometric  - 1 x daily - 4-7 x weekly - 2 sets - 10 reps - 5 seconds hold - Seated Shoulder Flexion AAROM with Pulley Behind  - 1 x daily - 4-7 x weekly - 1 sets - 2-5 minuters hold - Seated Shoulder Abduction AAROM with Pulley Behind  - 1 x daily - 4-7 x weekly - 1 sets - 2-5 minutes hold    ASSESSMENT:  CLINICAL IMPRESSION: PT was able to progress HEP to include resistance and pulleys with light grip.  He appears to understand HEP including grip concerns.    Patient to benefit from skilled PT services to address impairments and limitations to improve to previous level of function without restriction secondary to condition.   OBJECTIVE IMPAIRMENTS: decreased activity tolerance, decreased coordination, decreased endurance, decreased mobility, decreased ROM, decreased strength, impaired flexibility, impaired UE functional use, postural dysfunction, and pain.   ACTIVITY LIMITATIONS: carrying, lifting, bed mobility, bathing, dressing, reach over head, and hygiene/grooming  PARTICIPATION LIMITATIONS: meal prep, cleaning, community activity, and yard work  PERSONAL FACTORS: Past/current experiences, Time since onset of injury/illness/exacerbation, and 3+ comorbidities: see PMH are also affecting patient's functional outcome.   REHAB POTENTIAL: Good  CLINICAL DECISION MAKING: Evolving/moderate complexity  EVALUATION COMPLEXITY: Moderate   GOALS: Goals reviewed with patient? Yes  SHORT TERM GOALS: (target date for Short term goals are 3 weeks: 09/04/2024)  1.Patient will demonstrate independent use of home exercise program to maintain progress from in clinic treatments. Baseline: SEE OBJECTIVE DATA Goal status:  Ongoing   08/25/2024  LONG TERM GOALS: (target dates for all long term goals are 8 weeks: 10/09/2024)   1.  Patient will demonstrate/report at worst less than or equal to 1/10 to facilitate minimal limitation in daily activity secondary to pain symptoms. Baseline: SEE OBJECTIVE DATA Goal status: Ongoing   08/25/2024   2. Patient will demonstrate independent use of home exercise program to facilitate ability to maintain/progress functional gains from skilled physical therapy services. Baseline: SEE OBJECTIVE DATA Goal status: Ongoing   08/25/2024   3.  Patient reports Patient-Specific Activity Score improved to >/= 5 to indicate improvement in functional activities.  Baseline: SEE OBJECTIVE DATA Goal status: Ongoing   08/25/2024   4.  Patient will demonstrate Rt shoulder MMT >/=4/5 throughout to facilitate lifting, reaching, carrying at [facility] in daily activity.  Baseline: SEE OBJECTIVE DATA Goal status: Ongoing   08/25/2024   5.  Patient will demonstrate Rt shoulder AROM WFL s symptoms to facilitate usual overhead reaching, self care, dressing at PLOF.  Baseline: SEE OBJECTIVE DATA Goal status: Ongoing   08/25/2024   PLAN: PT FREQUENCY: 2x/week  PT DURATION: 8 weeks  PLANNED INTERVENTIONS: [id number]- PT Re-evaluation, 97750- Physical Performance Testing, 97110-Therapeutic exercises, 97530- Therapeutic activity, [id number]- Neuromuscular re-education, 97535- Self Care, [id number]- Manual therapy, Patient/Family education, and Joint mobilization  PLAN FOR NEXT SESSION:  check and update HEP.     Grayce Spatz, PT, DPT 08/27/2024, 12:38 PM

## 2024-09-02 ENCOUNTER — Ambulatory Visit (INDEPENDENT_AMBULATORY_CARE_PROVIDER_SITE_OTHER): Admitting: Physical Therapy

## 2024-09-02 ENCOUNTER — Encounter: Payer: Self-pay | Admitting: Physical Therapy

## 2024-09-02 DIAGNOSIS — M25511 Pain in right shoulder: Secondary | ICD-10-CM

## 2024-09-02 DIAGNOSIS — M25611 Stiffness of right shoulder, not elsewhere classified: Secondary | ICD-10-CM

## 2024-09-02 DIAGNOSIS — R600 Localized edema: Secondary | ICD-10-CM

## 2024-09-02 DIAGNOSIS — M6281 Muscle weakness (generalized): Secondary | ICD-10-CM

## 2024-09-02 DIAGNOSIS — G8929 Other chronic pain: Secondary | ICD-10-CM

## 2024-09-02 NOTE — Therapy (Signed)
 OUTPATIENT PHYSICAL THERAPY UPPER EXTREMITY TREATMENT   Patient Name: Jose [name] MRN: [id number] DOB:03-Jan-1951, 73 y.o., male Today's Date: 09/02/2024  END OF SESSION:  PT End of Session - 09/02/24 0806     Visit Number 4    Number of Visits 16    Date for PT Re-Evaluation 10/09/24    Authorization Type Medicare A/B & BCBS supp    Progress Note Due on Visit 10    PT Start Time 0804    PT Stop Time 0842    PT Time Calculation (min) 38 min    Activity Tolerance Patient tolerated treatment well;Patient limited by pain    Behavior During Therapy WFL for tasks assessed/performed             Past Medical History:  Diagnosis Date   Arthritis    R shoulder, great toes, hands- has gout & has injections in knees with Dr. [name]    Bicuspid aortic valve with ascending aorta 4.0 to 4.5 cm in diameter    CAD (coronary artery disease)    Cancer (HCC)    skin ca-    GERD (gastroesophageal reflux disease)    Headache    History of hiatal hernia    Hypertension    Prostate cancer (HCC)    S/P TAVR (transcatheter aortic valve replacement)    SDH (subdural hematoma) (HCC)    Severe aortic stenosis    Thoracic ascending aortic aneurysm (HCC)    Vertebral artery aneurysm (HCC)    Past Surgical History:  Procedure Laterality Date   BRAIN SURGERY  2016   evacuation of SDH   CARDIAC CATHETERIZATION     CARPAL TUNNEL RELEASE Bilateral    embolization of left vertebral artery aneurysm with pipeline/coil  10/31/2022   GREEN LIGHT LASER TURP (TRANSURETHRAL RESECTION OF PROSTATE  04/27/2017   KNEE ARTHROPLASTY     LEFT HEART CATH AND CORONARY ANGIOGRAPHY N/A 10/08/2019   Procedure: LEFT HEART CATH AND CORONARY ANGIOGRAPHY;  Surgeon: Wonda Sharper, MD;  Location: [facility] INVASIVE CV LAB;  Service: Cardiovascular;  Laterality: N/A;   QUADRICEPS TENDON REPAIR Bilateral 06/15/2017   Procedure: REPAIR QUADRICEP TENDON;  Surgeon: Addie Cordella Glendia, MD;  Location: [facility] OR;  Service:  Orthopedics;  Laterality: Bilateral;   SHOULDER SURGERY Right    TEE WITHOUT CARDIOVERSION N/A 07/03/2022   Procedure: TRANSESOPHAGEAL ECHOCARDIOGRAM (TEE);  Surgeon: Delford Maude BROCKS, MD;  Location: [facility] ENDOSCOPY;  Service: Cardiovascular;  Laterality: N/A;   TRANSCATHETER AORTIC VALVE REPLACEMENT, TRANSFEMORAL  12/23/2019   TRANSCATHETER AORTIC VALVE REPLACEMENT, TRANSFEMORAL N/A 12/23/2019   Procedure: TRANSCATHETER AORTIC VALVE REPLACEMENT, TRANSFEMORAL;  Surgeon: Wonda Sharper, MD;  Location: [facility] OR;  Service: Open Heart Surgery;  Laterality: N/A;   VASCULAR SURGERY     Patient Active Problem List   Diagnosis Date Noted   Hypertension    Severe aortic stenosis 10/08/2019   Candidiasis    Abnormality of gait    Hypoalbuminemia due to protein-calorie malnutrition [facility])    History of gout    Sleep disturbance    Constipation    Rupture of quadriceps tendon, right, sequela 06/19/2017   Quadriceps tendon rupture, left, sequela 06/19/2017   Acute blood loss anemia 06/19/2017   Fall    History of subdural hematoma    Post-operative pain    Recurrent falls    Quadriceps tendon rupture 06/15/2017   Primary osteoarthritis of both knees 02/28/2017   Primary osteoarthritis of both hands 02/28/2017   Uricacidemia 02/28/2017   Pain in  joint of left knee 02/07/2017   Idiopathic chronic gout, unspecified site, without tophus (tophi) 11/29/2016   HEMATURIA UNSPECIFIED 01/25/2009   Abdominal pain 01/25/2009   XERODERMA 03/10/2008   UNS ADVRS EFF UNS RX MEDICINAL&BIOLOGICAL SBSTNC 03/10/2008   ADJUSTMENT REACTION WITH PHYSICAL SYMPTOMS 02/14/2008   MUSCLE SPASM 02/14/2008   ACUTE PROSTATITIS 12/16/2007   BREAST LUMP OR MASS, RIGHT 07/12/2007   H/O: RCT (rotator cuff tear) 01/13/2007   GOUT 01/08/2007    PCP: Birda Ahumada, MD  REFERRING PROVIDER: Addie Cordella Hamilton, MD  REFERRING DIAG: [phone] (ICD-10-CM) - Chronic right shoulder painM19.011 (ICD-10-CM) - Primary  osteoarthritis, right shoulder  THERAPY DIAG:  Muscle weakness (generalized)  Stiffness of right shoulder, not elsewhere classified  Localized edema  Chronic right shoulder pain  Rationale for Evaluation and Treatment: Rehabilitation  ONSET DATE: referral 07/30/2024, after trigger finger release on 8/72025  SUBJECTIVE:                                                                                                                                                                                      SUBJECTIVE STATEMENT: He has been doing the band exercises without issues to shoulder or hand.  He has not purchased a pulley for home yet.  Hand dominance: Right  PERTINENT HISTORY: polio, RUE trigger finger release 07/24/2024,  aortic valve replacement 2021, bil quad tendorn repair 2018 w/left reconstruction 2020, OA, gout, CAD, skin & prostate CA, right breast lump or mass, HTN, SDH with surgery 2016, Thoracic ascending aortic aneurysm, vertebral artery aneurysm, knee arthroplasty  DIAGNOSTIC FINDINGS: RIGHT SHOULDER - 2+ VIEW  IMPRESSION: 1. Advanced glenohumeral osteoarthritis. 2. Soft tissue calcifications in the region of the rotator cuff insertion, can be seen with calcific tendinopathy.  PAIN:  Are you having pain? Yes: NPRS scale: since PT up to 6/10 Pain location: right shoulder more joint anteriorly Pain description: achy and sharp, random Aggravating factors: movements Relieving factors: rest, heat, ice, medication  PRECAUTIONS: None  RED FLAGS: None   WEIGHT BEARING RESTRICTIONS: No  FALLS:  Has patient fallen in last 6 months? No  LIVING ENVIRONMENT: Lives with: lives with partner Lives in: House Stairs: Yes: Internal: 17 steps; on left going up and External: 2 steps; none (office & gym are upstairs;  master bedroom & bath on main level) Has following equipment at home: None  OCCUPATION: part-time consulting  PLOF: Independent  PATIENT GOALS:  strengthening  NEXT MD VISIT: tbd  OBJECTIVE:  Note: Objective measures were completed at Evaluation unless otherwise noted.  Patient-Specific Activity Scoring Scheme  0 represents "unable to perform." 10 represents "able to perform at prior level. 0 1 2 3  4 5 6 7 8 9 10  (Date and Score)  Activity Eval     1. exercising  3    2. lifting 3    3. yardwork 0   4.    5.    Score 2    Total score = sum of the activity scores/number of activities Minimum detectable change (90%CI) for average score = 2 points Minimum detectable change (90%CI) for single activity score = 3 points  COGNITION: Overall cognitive status: Within functional limits for tasks assessed     SENSATION: WFL  POSTURE: round shoulder, forward head   UPPER EXTREMITY ROM:   ROM Right eval  Shoulder flexion Seated P: 142* painful A: 132*  Shoulder extension Standing P: 38* painful A: 36*  Shoulder abduction Seated P: 57* painful A: 43* painful  Shoulder horizontal adduction Supine: A: 12*  Shoulder internal rotation Supine A: 52* painful P: 57* painful  Shoulder external rotation Supine P: 37* painful Sidelying A:  painful  Elbow flexion   Elbow extension   Wrist flexion   Wrist extension   Wrist ulnar deviation   Wrist radial deviation   Wrist pronation   Wrist supination   (Blank rows = not tested)  UPPER EXTREMITY MMT:  MMT Right eval Left eval  Shoulder flexion 3-/5   Shoulder extension    Shoulder abduction 3-/5   Shoulder adduction    Shoulder internal rotation 3-/5   Shoulder external rotation 3-/5   Middle trapezius    Lower trapezius    Elbow flexion    Elbow extension    Wrist flexion    Wrist extension    Wrist ulnar deviation    Wrist radial deviation    Wrist pronation    Wrist supination    Grip strength (lbs)    (Blank rows = not tested)  SHOULDER SPECIAL TESTS: not tested  JOINT MOBILITY TESTING:  Not tested  PALPATION:  Not tested  TODAY'S  TREATMENT:                                                                                                       DATE: 09/02/2024 Therapeutic Activities:  UBE BUEs only seat 11 level 3 for 3.5 min ea direction. Cues on posture Cervical retraction standing with pillow on door frame.   Scapular retraction with mirror for visual feedback to posture.  15 reps with tactile cues. Standing green then red theraband reactive ER 5 reps ea level and active ER red 5 reps PT cues for posture and motion including right grip.  PT palpating right shoulder with popcorn motion in anterior area.    Left side lying RUE ER against gravity 10 reps no weight, 10 reps 2 sets with 1#.  PT cueing shoulder posture.  Prone scapular retraction against gravity field goal position and arms at sides 10 reps 3 sets of each.  Supine with half foam roll along spine for stretch and larger range:  RUE & BUE chest press with end serratus punch 10 reps 2 sets.  PT cues on technique Supine with  half foam roll along spine for stretch and larger range: horizontal adduction with end range pectoralis stretch 10  reps 2 sets.  PT cues on technique  PT updated HEP including HO, demo & verbal cues with above exercises.  Pt verbalized understanding.    TREATMENT:                                                                                                       DATE: 08/27/2024 Therapeutic Exercise:  AAROM seated pulley flexion 2 minutes and abduction 2 minutes.  PT recommending for flexibility and using muscles in higher ranges. PT recommending seated on chair not Swiss ball like previously. So he does not over use hand if he has a balance loss.  Pt verbalized understanding.  Standing blue theraband (L4) PT cues for posture and motion including right grip:  punch / forward reach RUE only & rows BUEs 15 reps 2 sets Standing RUE reactive flex, ext, IR & ER blue theraband (L4) PT cues for posture and motion including right grip 10 reps 2  sets  PT updated HEP including HO, demo & verbal cues with above exercises.  Pt verbalized understanding.     TREATMENT:                                                                                                       DATE: 08/25/2024 Therapeutic Exercise:  AAROM seated pulley flexion 2 minutes and abduction 2 minutes.  PT recommending for flexibility and using muscles in higher ranges. PT recommending seated on chair not Swiss ball like previously. So he does not over use hand if he has a balance loss.  Pt verbalized understanding.   Therapeutic Activities: Standing chin tucks / cervical retraction with mirror for visual feedback and PT cues 10 reps UE ranger for overhead reaching motion flexion 15 reps, scaption 15 reps. PT cues for motion. Shoulder ladder abduction 15 reps with PT cues for proper motion like reaching upward to side 15 reps.  Supine pronation / supination with shoulder IR / ER with straight arm 5 reps, then shoulder protraction with hand pronated / IR 5 reps, shoulder protraction with hand supinated / ER 5 reps.  Pt performed 1st set at 45* abduction, 2nd set at 60* abduction, 3rd set at 80* abduction.   Self-Care: PT demo & verbal cues on using pillows to position UEs in supine, left side lying and right side lying.  Pt return demo & verbalized understanding.  He reported RUE shoulder felt better.     TREATMENT:  DATE: 08/14/2024 Therapeutic Exercise: HEP instruction/performance c cues for techniques, handout provided.  Trial set performed of each for comprehension and symptom assessment.  See below for exercise list   PATIENT EDUCATION: Education details: HEP, POC Person educated: Patient Education method: Explanation, Demonstration, Verbal cues, and Handouts Education comprehension: verbalized understanding, returned demonstration, and verbal cues  required  HOME EXERCISE PROGRAM: Access Code: [email or web address] URL: [email or web address] Date: 09/02/2024 Prepared by: Grayce Spatz  Exercises - Isometric Shoulder Flexion at Wall  - 1-2 x daily - 7 x weekly - 2 sets - 10 reps - 5 seconds hold - Isometric Shoulder Extension at Wall  - 1-2 x daily - 7 x weekly - 2 sets - 10 reps - 5 seconds hold - Isometric Shoulder Abduction at Wall  - 1-2 x daily - 7 x weekly - 2 sets - 10 reps - 5 seconds hold - Isometric Shoulder Adduction  - 1-2 x daily - 7 x weekly - 2 sets - 10 reps - 5 seconds hold - Standing Isometric Shoulder Internal Rotation at Doorway  - 1-2 x daily - 7 x weekly - 2 sets - 10 reps - 5 seconds hold - Standing Isometric Shoulder External Rotation with Doorway  - 1-2 x daily - 7 x weekly - 2 sets - 10 reps - 5 seconds hold - Standing Row with Anchored Resistance  - 1 x daily - 4-7 x weekly - 2 sets - 15 reps - 5 seconds hold - forward reach  - 1 x daily - 4-7 x weekly - 2 sets - 15 reps - 5 seconds hold - Shoulder External Rotation Walkouts with Anchored Resistance  - 1 x daily - 4-7 x weekly - 2 sets - 10 reps - 5 seconds hold - Shoulder Internal Rotation Reactive Isometrics  - 1 x daily - 4-7 x weekly - 2 sets - 10 reps - 5 seconds hold - walking balance with band behind you  - 1 x daily - 4-7 x weekly - 2 sets - 10 reps - 5 seconds hold - Standing Shoulder Row Reactive Isometric  - 1 x daily - 4-7 x weekly - 2 sets - 10 reps - 5 seconds hold - Seated Shoulder Flexion AAROM with Pulley Behind  - 1 x daily - 4-7 x weekly - 1 sets - 2-5 minuters hold - Seated Shoulder Abduction AAROM with Pulley Behind  - 1 x daily - 4-7 x weekly - 1 sets - 2-5 minutes hold - Sidelying Shoulder External Rotation  - 1 x daily - 4-7 x weekly - 2-3 sets - 10 reps - 5 seconds hold - Prone W Scapular Retraction  - 1 x daily - 4 x weekly - 2-3 sets - 10 reps - 5 seconds hold - Prone Scapular Slide with Shoulder Extension  - 1 x daily - 4 x weekly  - 2-3 sets - 10 reps - 5 seconds hold - Supine Alternating Arm Punches on Foam Roll  - 1 x daily - 4 x weekly - 2-3 sets - 10 reps - 5 seconds hold - supine chest press with punch upward at end  - 1 x daily - 4 x weekly - 2-3 sets - 10 reps - 5 seconds hold - Supine Shoulder Horizontal Abduction with Dumbbells  - 1 x daily - 4 x weekly - 2-3 sets - 10 reps - 5 seconds hold    ASSESSMENT:  CLINICAL IMPRESSION: PT session focused on upper body posture with shoulder activities.  He improved with antigravity activities with PT instruction and repetition.    OBJECTIVE IMPAIRMENTS: decreased activity tolerance, decreased coordination, decreased endurance, decreased mobility, decreased ROM, decreased strength, impaired flexibility, impaired UE functional use, postural dysfunction, and pain.   ACTIVITY LIMITATIONS: carrying, lifting, bed mobility, bathing, dressing, reach over head, and hygiene/grooming  PARTICIPATION LIMITATIONS: meal prep, cleaning, community activity, and yard work  PERSONAL FACTORS: Past/current experiences, Time since onset of injury/illness/exacerbation, and 3+ comorbidities: see PMH are also affecting patient's functional outcome.   REHAB POTENTIAL: Good  CLINICAL DECISION MAKING: Evolving/moderate complexity  EVALUATION COMPLEXITY: Moderate   GOALS: Goals reviewed with patient? Yes  SHORT TERM GOALS: (target date for Short term goals are 3 weeks: 09/04/2024)  1.Patient will demonstrate independent use of home exercise program to maintain progress from in clinic treatments. Baseline: SEE OBJECTIVE DATA Goal status:  Ongoing  09/02/2024  LONG TERM GOALS: (target dates for all long term goals are 8 weeks: 10/09/2024)   1. Patient will demonstrate/report at worst less than or equal to 1/10 to facilitate minimal limitation in daily activity secondary to pain symptoms. Baseline: SEE OBJECTIVE DATA Goal status: Ongoing   09/02/2024   2. Patient will demonstrate  independent use of home exercise program to facilitate ability to maintain/progress functional gains from skilled physical therapy services. Baseline: SEE OBJECTIVE DATA Goal status: Ongoing   09/02/2024   3.  Patient reports Patient-Specific Activity Score improved to >/= 5 to indicate improvement in functional activities.  Baseline: SEE OBJECTIVE DATA Goal status: Ongoing   09/02/2024   4.  Patient will demonstrate Rt shoulder MMT >/=4/5 throughout to facilitate lifting, reaching, carrying at [facility] in daily activity.  Baseline: SEE OBJECTIVE DATA Goal status: Ongoing   09/02/2024   5.  Patient will demonstrate Rt shoulder AROM WFL s symptoms to facilitate usual overhead reaching, self care, dressing at PLOF.  Baseline: SEE OBJECTIVE DATA Goal status: Ongoing   09/02/2024   PLAN: PT FREQUENCY: 2x/week  PT DURATION: 8 weeks  PLANNED INTERVENTIONS: [id number]- PT Re-evaluation, 97750- Physical Performance Testing, 97110-Therapeutic exercises, 97530- Therapeutic activity, [id number]- Neuromuscular re-education, 97535- Self Care, [id number]- Manual therapy, Patient/Family education, and Joint mobilization  PLAN FOR NEXT SESSION:  check updated HEP.     Grayce Spatz, PT, DPT 09/02/2024, 9:03 AM

## 2024-09-03 ENCOUNTER — Ambulatory Visit: Admitting: Orthopedic Surgery

## 2024-09-03 DIAGNOSIS — Z9889 Other specified postprocedural states: Secondary | ICD-10-CM

## 2024-09-03 NOTE — Progress Notes (Signed)
   Jose [name] - 73 y.o. male MRN [id number]  Date of birth: December 08, 1951  Office Visit Note: Visit Date: 09/03/2024 PCP: Birda Ahumada, MD Referred by: Birda Ahumada, MD  Subjective:  HPI: Jose [name] is a 73 y.o. male who presents today for follow up 5 weeks status post right long and ring finger trigger digit release.  He is doing well overall, is progressing with range of motion nicely with occupational therapy.  No residual clicking or locking.  Pertinent ROS were reviewed with the patient and found to be negative unless otherwise specified above in HPI.   Assessment & Plan: Visit Diagnoses:  1. S/P trigger finger release     Plan: He continues to do well postoperatively.  Continue with occupational therapy for the time being, transition to home exercise when appropriate.  Follow-up as needed.  Follow-up: No follow-ups on file.   Meds & Orders: No orders of the defined types were placed in this encounter.  No orders of the defined types were placed in this encounter.    Procedures: No procedures performed       Objective:   Vital Signs: There were no vitals taken for this visit.  Ortho Exam Right hand: - Well-healing incision at the base of the long and ring finger finger, skin is well-approximated, no erythema or drainage - Able to perform full digital range of motion without residual clicking or locking - Sensation intact distally, hand is warm well-perfused   Imaging: No results found.   Nghia Mcentee Afton Alderton, M.D. [facility], Hand Surgery

## 2024-09-04 ENCOUNTER — Ambulatory Visit (INDEPENDENT_AMBULATORY_CARE_PROVIDER_SITE_OTHER): Admitting: Physical Therapy

## 2024-09-04 ENCOUNTER — Encounter: Payer: Self-pay | Admitting: Physical Therapy

## 2024-09-04 DIAGNOSIS — M6281 Muscle weakness (generalized): Secondary | ICD-10-CM

## 2024-09-04 DIAGNOSIS — M25511 Pain in right shoulder: Secondary | ICD-10-CM

## 2024-09-04 DIAGNOSIS — R600 Localized edema: Secondary | ICD-10-CM | POA: Diagnosis not present

## 2024-09-04 DIAGNOSIS — M25611 Stiffness of right shoulder, not elsewhere classified: Secondary | ICD-10-CM | POA: Diagnosis not present

## 2024-09-04 DIAGNOSIS — G8929 Other chronic pain: Secondary | ICD-10-CM

## 2024-09-04 NOTE — Therapy (Signed)
 OUTPATIENT PHYSICAL THERAPY UPPER EXTREMITY TREATMENT   Patient Name: Jose [name] MRN: [id number] DOB:[birth year], 73 y.o., male Today's Date: 09/04/2024  END OF SESSION:  PT End of Session - 09/04/24 0759     Visit Number 5    Number of Visits 16    Date for PT Re-Evaluation 10/09/24    Authorization Type Medicare A/B & BCBS supp    Progress Note Due on Visit 10    PT Start Time 0759    PT Stop Time 0841    PT Time Calculation (min) 42 min    Activity Tolerance Patient tolerated treatment well;Patient limited by pain    Behavior During Therapy WFL for tasks assessed/performed              Past Medical History:  Diagnosis Date   Arthritis    R shoulder, great toes, hands- has gout & has injections in knees with Dr. [name]    Bicuspid aortic valve with ascending aorta 4.0 to 4.5 cm in diameter    CAD (coronary artery disease)    Cancer (HCC)    skin ca-    GERD (gastroesophageal reflux disease)    Headache    History of hiatal hernia    Hypertension    Prostate cancer (HCC)    S/P TAVR (transcatheter aortic valve replacement)    SDH (subdural hematoma) (HCC)    Severe aortic stenosis    Thoracic ascending aortic aneurysm (HCC)    Vertebral artery aneurysm (HCC)    Past Surgical History:  Procedure Laterality Date   BRAIN SURGERY  2016   evacuation of SDH   CARDIAC CATHETERIZATION     CARPAL TUNNEL RELEASE Bilateral    embolization of left vertebral artery aneurysm with pipeline/coil  10/31/2022   GREEN LIGHT LASER TURP (TRANSURETHRAL RESECTION OF PROSTATE  04/27/2017   KNEE ARTHROPLASTY     LEFT HEART CATH AND CORONARY ANGIOGRAPHY N/A 10/08/2019   Procedure: LEFT HEART CATH AND CORONARY ANGIOGRAPHY;  Surgeon: Wonda Sharper, MD;  Location: [facility] INVASIVE CV LAB;  Service: Cardiovascular;  Laterality: N/A;   QUADRICEPS TENDON REPAIR Bilateral 06/15/2017   Procedure: REPAIR QUADRICEP TENDON;  Surgeon: Addie Cordella Glendia, MD;  Location: [facility] OR;  Service:  Orthopedics;  Laterality: Bilateral;   SHOULDER SURGERY Right    TEE WITHOUT CARDIOVERSION N/A 07/03/2022   Procedure: TRANSESOPHAGEAL ECHOCARDIOGRAM (TEE);  Surgeon: Delford Maude BROCKS, MD;  Location: [facility] ENDOSCOPY;  Service: Cardiovascular;  Laterality: N/A;   TRANSCATHETER AORTIC VALVE REPLACEMENT, TRANSFEMORAL  12/23/2019   TRANSCATHETER AORTIC VALVE REPLACEMENT, TRANSFEMORAL N/A 12/23/2019   Procedure: TRANSCATHETER AORTIC VALVE REPLACEMENT, TRANSFEMORAL;  Surgeon: Wonda Sharper, MD;  Location: [facility] OR;  Service: Open Heart Surgery;  Laterality: N/A;   VASCULAR SURGERY     Patient Active Problem List   Diagnosis Date Noted   Hypertension    Severe aortic stenosis 10/08/2019   Candidiasis    Abnormality of gait    Hypoalbuminemia due to protein-calorie malnutrition [facility])    History of gout    Sleep disturbance    Constipation    Rupture of quadriceps tendon, right, sequela 06/19/2017   Quadriceps tendon rupture, left, sequela 06/19/2017   Acute blood loss anemia 06/19/2017   Fall    History of subdural hematoma    Post-operative pain    Recurrent falls    Quadriceps tendon rupture 06/15/2017   Primary osteoarthritis of both knees 02/28/2017   Primary osteoarthritis of both hands 02/28/2017   Uricacidemia 02/28/2017   Pain  in joint of left knee 02/07/2017   Idiopathic chronic gout, unspecified site, without tophus (tophi) 11/29/2016   HEMATURIA UNSPECIFIED 01/25/2009   Abdominal pain 01/25/2009   XERODERMA 03/10/2008   UNS ADVRS EFF UNS RX MEDICINAL&BIOLOGICAL SBSTNC 03/10/2008   ADJUSTMENT REACTION WITH PHYSICAL SYMPTOMS 02/14/2008   MUSCLE SPASM 02/14/2008   ACUTE PROSTATITIS 12/16/2007   BREAST LUMP OR MASS, RIGHT 07/12/2007   H/O: RCT (rotator cuff tear) 01/13/2007   GOUT 01/08/2007    PCP: Birda Ahumada, MD  REFERRING PROVIDER: Addie Cordella Hamilton, MD  REFERRING DIAG: [phone] (ICD-10-CM) - Chronic right shoulder painM19.011 (ICD-10-CM) - Primary  osteoarthritis, right shoulder  THERAPY DIAG:  Muscle weakness (generalized)  Stiffness of right shoulder, not elsewhere classified  Localized edema  Chronic right shoulder pain  Rationale for Evaluation and Treatment: Rehabilitation  ONSET DATE: referral 07/30/2024, after trigger finger release on 8/72025  SUBJECTIVE:                                                                                                                                                                                      SUBJECTIVE STATEMENT: He has been doing his updated HEP.  He woke up this morning with some soreness in shoulder joint but went away with movement.  Hand dominance: Right  PERTINENT HISTORY: polio, RUE trigger finger release 07/24/2024,  aortic valve replacement 2021, bil quad tendorn repair 2018 w/left reconstruction 2020, OA, gout, CAD, skin & prostate CA, right breast lump or mass, HTN, SDH with surgery 2016, Thoracic ascending aortic aneurysm, vertebral artery aneurysm, knee arthroplasty  DIAGNOSTIC FINDINGS: RIGHT SHOULDER - 2+ VIEW  IMPRESSION: 1. Advanced glenohumeral osteoarthritis. 2. Soft tissue calcifications in the region of the rotator cuff insertion, can be seen with calcific tendinopathy.  PAIN:  Are you having pain? Yes: NPRS scale: since last PT up to 4/10 Pain location: right shoulder more joint anteriorly Pain description: achy and sharp, random Aggravating factors: movements Relieving factors: rest, heat, ice, medication  PRECAUTIONS: None  RED FLAGS: None   WEIGHT BEARING RESTRICTIONS: No  FALLS:  Has patient fallen in last 6 months? No  LIVING ENVIRONMENT: Lives with: lives with partner Lives in: House Stairs: Yes: Internal: 17 steps; on left going up and External: 2 steps; none (office & gym are upstairs;  master bedroom & bath on main level) Has following equipment at home: None  OCCUPATION: part-time consulting  PLOF: Independent  PATIENT GOALS:  strengthening  NEXT MD VISIT: tbd  OBJECTIVE:  Note: Objective measures were completed at Evaluation unless otherwise noted.  Patient-Specific Activity Scoring Scheme  0 represents "unable to perform." 10 represents "able to perform at prior level. 0  1 2 3 4 5 6 7 8 9 10  (Date and Score)  Activity Eval   09/04/24  1. exercising  3  6.5  2. lifting 3  4  3. yardwork 0 0  4.    5.    Score 2 3.5   Total score = sum of the activity scores/number of activities Minimum detectable change (90%CI) for average score = 2 points Minimum detectable change (90%CI) for single activity score = 3 points  COGNITION: Overall cognitive status: Within functional limits for tasks assessed     SENSATION: WFL  POSTURE: round shoulder, forward head   UPPER EXTREMITY ROM:   ROM Right eval  Shoulder flexion Seated P: 142* painful A: 132*  Shoulder extension Standing P: 38* painful A: 36*  Shoulder abduction Seated P: 57* painful A: 43* painful  Shoulder horizontal adduction Supine: A: 12*  Shoulder internal rotation Supine A: 52* painful P: 57* painful  Shoulder external rotation Supine P: 37* painful Sidelying A:  painful  Elbow flexion   Elbow extension   Wrist flexion   Wrist extension   Wrist ulnar deviation   Wrist radial deviation   Wrist pronation   Wrist supination   (Blank rows = not tested)  UPPER EXTREMITY MMT:  MMT Right eval Left eval  Shoulder flexion 3-/5   Shoulder extension    Shoulder abduction 3-/5   Shoulder adduction    Shoulder internal rotation 3-/5   Shoulder external rotation 3-/5   Middle trapezius    Lower trapezius    Elbow flexion    Elbow extension    Wrist flexion    Wrist extension    Wrist ulnar deviation    Wrist radial deviation    Wrist pronation    Wrist supination    Grip strength (lbs)    (Blank rows = not tested)  SHOULDER SPECIAL TESTS: not tested  JOINT MOBILITY TESTING:  Not tested  PALPATION:  Not  tested  TODAY'S TREATMENT:                                                                                                       DATE: 09/04/2024 Therapeutic Activities:  AAROM seated pulley flexion 3 minutes and abduction 3 minutes. He ordered pulleys for HEP.  Left side lying RUE ER against gravity PT tactile & verbal cues on scapular retraction first to limit winging  10 reps no weight, 10 reps 2 sets with 1#.  PT cueing shoulder posture.  Left side lying RUE shoulder ext then internal rotation; he is only able to get hand to buttocks. 5 reps 3 sets with PT tactile cues for scapula / GH control Supine with half foam roll along spine for stretch and larger range:  RUE & BUE chest press with end serratus punch with RUE 1# 10 reps 2 sets.  PT cues on technique for scapula / GH control Supine with half foam roll along spine for stretch and larger range: horizontal adduction with end range pectoralis stretch RUE 1# 10  reps 2 sets.  PT cues on  technique  for scapula / GH control Supine with half foam roll along spine for stretch and larger range: wand BUE shoulder flexion 10  reps 2 sets.  PT cues on technique  for scapula / GH control Supine biceps curl 1# 10 reps 1st set palm up, 2nd set palm neutral, 3rd palm down.  PT cues on technique  for scapula / GH control especially at end of eccentric motion. Supine triceps press 1# 10 reps 2 sets. PT cues on technique  for scapula / GH control especially at end of eccentric motion. Standing green theraband rows 10 reps 2 sets. PT cues on technique  for scapula / GH control.   Pt verbalized understanding of HEP.     TREATMENT:                                                                                                       DATE: 09/02/2024 Therapeutic Activities:  UBE BUEs only seat 11 level 3 for 3.5 min ea direction. Cues on posture Cervical retraction standing with pillow on door frame.   Scapular retraction with mirror for visual feedback to  posture.  15 reps with tactile cues. Standing green then red theraband reactive ER 5 reps ea level and active ER red 5 reps PT cues for posture and motion including right grip.  PT palpating right shoulder with popcorn motion in anterior area.    Left side lying RUE ER against gravity 10 reps no weight, 10 reps 2 sets with 1#.  PT cueing shoulder posture.  Prone scapular retraction against gravity field goal position and arms at sides 10 reps 3 sets of each.  Supine with half foam roll along spine for stretch and larger range:  RUE & BUE chest press with end serratus punch 10 reps 2 sets.  PT cues on technique Supine with half foam roll along spine for stretch and larger range: horizontal adduction with end range pectoralis stretch 10  reps 2 sets.  PT cues on technique  PT updated HEP including HO, demo & verbal cues with above exercises.  Pt verbalized understanding.    TREATMENT:                                                                                                       DATE: 08/27/2024 Therapeutic Exercise:  AAROM seated pulley flexion 2 minutes and abduction 2 minutes.  PT recommending for flexibility and using muscles in higher ranges. PT recommending seated on chair not Swiss ball like previously. So he does not over use hand if he has a balance loss.  Pt verbalized understanding.  Standing blue theraband (L4) PT cues  for posture and motion including right grip:  punch / forward reach RUE only & rows BUEs 15 reps 2 sets Standing RUE reactive flex, ext, IR & ER blue theraband (L4) PT cues for posture and motion including right grip 10 reps 2 sets  PT updated HEP including HO, demo & verbal cues with above exercises.  Pt verbalized understanding.     TREATMENT:                                                                                                       DATE: 08/25/2024 Therapeutic Exercise:  AAROM seated pulley flexion 2 minutes and abduction 2 minutes.  PT  recommending for flexibility and using muscles in higher ranges. PT recommending seated on chair not Swiss ball like previously. So he does not over use hand if he has a balance loss.  Pt verbalized understanding.   Therapeutic Activities: Standing chin tucks / cervical retraction with mirror for visual feedback and PT cues 10 reps UE ranger for overhead reaching motion flexion 15 reps, scaption 15 reps. PT cues for motion. Shoulder ladder abduction 15 reps with PT cues for proper motion like reaching upward to side 15 reps.  Supine pronation / supination with shoulder IR / ER with straight arm 5 reps, then shoulder protraction with hand pronated / IR 5 reps, shoulder protraction with hand supinated / ER 5 reps.  Pt performed 1st set at 45* abduction, 2nd set at 60* abduction, 3rd set at 80* abduction.   Self-Care: PT demo & verbal cues on using pillows to position UEs in supine, left side lying and right side lying.  Pt return demo & verbalized understanding.  He reported RUE shoulder felt better.      PATIENT EDUCATION: Education details: HEP, POC Person educated: Patient Education method: Programmer, multimedia, Demonstration, Verbal cues, and Handouts Education comprehension: verbalized understanding, returned demonstration, and verbal cues required  HOME EXERCISE PROGRAM: Access Code: [email or web address] URL: [email or web address] Date: 09/02/2024 Prepared by: Grayce Spatz  Exercises - Isometric Shoulder Flexion at Wall  - 1-2 x daily - 7 x weekly - 2 sets - 10 reps - 5 seconds hold - Isometric Shoulder Extension at Wall  - 1-2 x daily - 7 x weekly - 2 sets - 10 reps - 5 seconds hold - Isometric Shoulder Abduction at Wall  - 1-2 x daily - 7 x weekly - 2 sets - 10 reps - 5 seconds hold - Isometric Shoulder Adduction  - 1-2 x daily - 7 x weekly - 2 sets - 10 reps - 5 seconds hold - Standing Isometric Shoulder Internal Rotation at Doorway  - 1-2 x daily - 7 x weekly - 2 sets - 10 reps - 5  seconds hold - Standing Isometric Shoulder External Rotation with Doorway  - 1-2 x daily - 7 x weekly - 2 sets - 10 reps - 5 seconds hold - Standing Row with Anchored Resistance  - 1 x daily - 4-7 x weekly - 2 sets - 15 reps - 5 seconds hold - forward reach  - 1 x  daily - 4-7 x weekly - 2 sets - 15 reps - 5 seconds hold - Shoulder External Rotation Walkouts with Anchored Resistance  - 1 x daily - 4-7 x weekly - 2 sets - 10 reps - 5 seconds hold - Shoulder Internal Rotation Reactive Isometrics  - 1 x daily - 4-7 x weekly - 2 sets - 10 reps - 5 seconds hold - walking balance with band behind you  - 1 x daily - 4-7 x weekly - 2 sets - 10 reps - 5 seconds hold - Standing Shoulder Row Reactive Isometric  - 1 x daily - 4-7 x weekly - 2 sets - 10 reps - 5 seconds hold - Seated Shoulder Flexion AAROM with Pulley Behind  - 1 x daily - 4-7 x weekly - 1 sets - 2-5 minuters hold - Seated Shoulder Abduction AAROM with Pulley Behind  - 1 x daily - 4-7 x weekly - 1 sets - 2-5 minutes hold - Sidelying Shoulder External Rotation  - 1 x daily - 4-7 x weekly - 2-3 sets - 10 reps - 5 seconds hold - Prone W Scapular Retraction  - 1 x daily - 4 x weekly - 2-3 sets - 10 reps - 5 seconds hold - Prone Scapular Slide with Shoulder Extension  - 1 x daily - 4 x weekly - 2-3 sets - 10 reps - 5 seconds hold - Supine Alternating Arm Punches on Foam Roll  - 1 x daily - 4 x weekly - 2-3 sets - 10 reps - 5 seconds hold - supine chest press with punch upward at end  - 1 x daily - 4 x weekly - 2-3 sets - 10 reps - 5 seconds hold - Supine Shoulder Horizontal Abduction with Dumbbells  - 1 x daily - 4 x weekly - 2-3 sets - 10 reps - 5 seconds hold    ASSESSMENT:  CLINICAL IMPRESSION: Patient is reporting less pain.  He is able to improve scapular control with shoulder motions with heavy focus & PT cues.    OBJECTIVE IMPAIRMENTS: decreased activity tolerance, decreased coordination, decreased endurance, decreased mobility,  decreased ROM, decreased strength, impaired flexibility, impaired UE functional use, postural dysfunction, and pain.   ACTIVITY LIMITATIONS: carrying, lifting, bed mobility, bathing, dressing, reach over head, and hygiene/grooming  PARTICIPATION LIMITATIONS: meal prep, cleaning, community activity, and yard work  PERSONAL FACTORS: Past/current experiences, Time since onset of injury/illness/exacerbation, and 3+ comorbidities: see PMH are also affecting patient's functional outcome.   REHAB POTENTIAL: Good  CLINICAL DECISION MAKING: Evolving/moderate complexity  EVALUATION COMPLEXITY: Moderate   GOALS: Goals reviewed with patient? Yes  SHORT TERM GOALS: (target date for Short term goals are 3 weeks: 09/04/2024)  1.Patient will demonstrate independent use of home exercise program to maintain progress from in clinic treatments. Baseline: SEE OBJECTIVE DATA Goal status:  MET 09/04/2024  LONG TERM GOALS: (target dates for all long term goals are 8 weeks: 10/09/2024)   1. Patient will demonstrate/report at worst less than or equal to 1/10 to facilitate minimal limitation in daily activity secondary to pain symptoms. Baseline: SEE OBJECTIVE DATA Goal status: Ongoing   09/02/2024   2. Patient will demonstrate independent use of home exercise program to facilitate ability to maintain/progress functional gains from skilled physical therapy services. Baseline: SEE OBJECTIVE DATA Goal status: Ongoing   09/02/2024   3.  Patient reports Patient-Specific Activity Score improved to >/= 5 to indicate improvement in functional activities.  Baseline: SEE OBJECTIVE DATA Goal status: Ongoing  09/02/2024   4.  Patient will demonstrate Rt shoulder MMT >/=4/5 throughout to facilitate lifting, reaching, carrying at [facility] in daily activity.  Baseline: SEE OBJECTIVE DATA Goal status: Ongoing   09/02/2024   5.  Patient will demonstrate Rt shoulder AROM WFL s symptoms to facilitate usual overhead reaching,  self care, dressing at PLOF.  Baseline: SEE OBJECTIVE DATA Goal status: Ongoing   09/02/2024   PLAN: PT FREQUENCY: 2x/week  PT DURATION: 8 weeks  PLANNED INTERVENTIONS: 97164- PT Re-evaluation, 97750- Physical Performance Testing, 97110-Therapeutic exercises, 97530- Therapeutic activity, [id number]- Neuromuscular re-education, 97535- Self Care, [id number]- Manual therapy, Patient/Family education, and Joint mobilization  PLAN FOR NEXT SESSION: work towards LTGs.  Continue to emphasize scapular control with shoulder motions.      Grayce Spatz, PT, DPT 09/04/2024, 8:52 AM

## 2024-09-08 NOTE — Therapy (Incomplete)
 OUTPATIENT OCCUPATIONAL THERAPY TREATMENT & *** NOTE Patient Name: Jose [name] MRN: [id number] DOB:Sep 10, 1951, 73 y.o., male Today's Date: 09/08/2024  REFERRING PROVIDER: Arlinda Buster, MD           ***             END OF SESSION:   Past Medical History:  Diagnosis Date   Arthritis    R shoulder, great toes, hands- has gout & has injections in knees with Dr. [name]    Bicuspid aortic valve with ascending aorta 4.0 to 4.5 cm in diameter    CAD (coronary artery disease)    Cancer (HCC)    skin ca-    GERD (gastroesophageal reflux disease)    Headache    History of hiatal hernia    Hypertension    Prostate cancer (HCC)    S/P TAVR (transcatheter aortic valve replacement)    SDH (subdural hematoma) (HCC)    Severe aortic stenosis    Thoracic ascending aortic aneurysm    Vertebral artery aneurysm    Past Surgical History:  Procedure Laterality Date   BRAIN SURGERY  2016   evacuation of SDH   CARDIAC CATHETERIZATION     CARPAL TUNNEL RELEASE Bilateral    embolization of left vertebral artery aneurysm with pipeline/coil  10/31/2022   GREEN LIGHT LASER TURP (TRANSURETHRAL RESECTION OF PROSTATE  04/27/2017   KNEE ARTHROPLASTY     LEFT HEART CATH AND CORONARY ANGIOGRAPHY N/A 10/08/2019   Procedure: LEFT HEART CATH AND CORONARY ANGIOGRAPHY;  Surgeon: Wonda Sharper, MD;  Location: [facility] INVASIVE CV LAB;  Service: Cardiovascular;  Laterality: N/A;   QUADRICEPS TENDON REPAIR Bilateral 06/15/2017   Procedure: REPAIR QUADRICEP TENDON;  Surgeon: Addie Cordella Glendia, MD;  Location: [facility] OR;  Service: Orthopedics;  Laterality: Bilateral;   SHOULDER SURGERY Right    TEE WITHOUT CARDIOVERSION N/A 07/03/2022   Procedure: TRANSESOPHAGEAL ECHOCARDIOGRAM (TEE);  Surgeon: Delford Maude BROCKS, MD;  Location: [facility] ENDOSCOPY;  Service: Cardiovascular;  Laterality: N/A;   TRANSCATHETER AORTIC VALVE REPLACEMENT, TRANSFEMORAL  12/23/2019   TRANSCATHETER AORTIC VALVE REPLACEMENT,  TRANSFEMORAL N/A 12/23/2019   Procedure: TRANSCATHETER AORTIC VALVE REPLACEMENT, TRANSFEMORAL;  Surgeon: Wonda Sharper, MD;  Location: [facility] OR;  Service: Open Heart Surgery;  Laterality: N/A;   VASCULAR SURGERY     Patient Active Problem List   Diagnosis Date Noted   Hypertension    Severe aortic stenosis 10/08/2019   Candidiasis    Abnormality of gait    Hypoalbuminemia due to protein-calorie malnutrition [facility])    History of gout    Sleep disturbance    Constipation    Rupture of quadriceps tendon, right, sequela 06/19/2017   Quadriceps tendon rupture, left, sequela 06/19/2017   Acute blood loss anemia 06/19/2017   Fall    History of subdural hematoma    Post-operative pain    Recurrent falls    Quadriceps tendon rupture 06/15/2017   Primary osteoarthritis of both knees 02/28/2017   Primary osteoarthritis of both hands 02/28/2017   Uricacidemia 02/28/2017   Pain in joint of left knee 02/07/2017   Idiopathic chronic gout, unspecified site, without tophus (tophi) 11/29/2016   HEMATURIA UNSPECIFIED 01/25/2009   Abdominal pain 01/25/2009   XERODERMA 03/10/2008   UNS ADVRS EFF UNS RX MEDICINAL&BIOLOGICAL SBSTNC 03/10/2008   ADJUSTMENT REACTION WITH PHYSICAL SYMPTOMS 02/14/2008   MUSCLE SPASM 02/14/2008   ACUTE PROSTATITIS 12/16/2007   BREAST LUMP OR MASS, RIGHT 07/12/2007   H/O: RCT (rotator cuff tear) 01/13/2007   GOUT 01/08/2007  ONSET DATE:  DOS 07/24/24  REFERRING DIAG:  F34.668 (ICD-10-CM) - Trigger finger, right middle finger  M65.341 (ICD-10-CM) - Trigger finger, right ring finger    THERAPY DIAG:     No diagnosis found.  Rationale for Evaluation and Treatment: Rehabilitation  PERTINENT HISTORY:  The patient states hx of triggering and pain in their hand and subsequent surgical release. The patient states having some stiffness, pain, decreased ability to make a fist and perform I/ADLs.     PRECAUTIONS: None relative to this evaluation and episode of care.    RED FLAGS: None   WEIGHT BEARING RESTRICTIONS: Yes: caution with weightbearing for the next 4-6 weeks, recommended less than 5lbs for next 2 weeks with affected hand   SUBJECTIVE:   SUBJECTIVE STATEMENT: The patient is now approx 7 weeks s/p Rt hand RF, MF finger TFRs.  He states ***    only mildly sore in sx area and occasionally in 1st dorsal compartment.       PAIN:  Are you having pain?  None at rest now in thumb/TFR, etc.    PATIENT GOALS: To improve motion, function with affected surgical hand  NEXT MD VISIT: PRN    OBJECTIVE MEASURES:   ADLs: Overall ADLs: States decreased ability to grab, hold household objects, pain and difficulty to open containers, perform FMS tasks (manipulate fasteners on clothing).     UPPER EXTREMITY ROM:     A/ROM Right eval Rt 08/26/24 Rt 09/10/24  Wrist flexion Approx 60 49 ***  Wrist extension Approx 55 69 ***  (Blank rows = not tested)                    Hand A/ROM Right eval Rt 08/26/24  Full Fist Ability (or Gap to Distal Palmar Crease) Fingers loosely touch palm, able to extend with some minimal lag at middle finger PIP joint Full fist now   Thumb Opposition  (Kapandji Scale)  8/10, WFL 9/10  (Blank rows = not tested)   HAND STRENGTH & FUNCTION: 08/26/24 grip strength Rt: 40# no pain    Eval: Observed weakness in affected right hand/arm, grossly 3-/5 MMT, but specific gripping and resistance training contraindicated today.  Per protocols we will begin light grip training in about 2 weeks.   SENSATION: Eval:  Light touch mildly diminished especially through sx area. Expected to improve with HEP and recommendations.   EDEMA:   Eval:  Mildly swollen in surgical hand today.  Expected to improve with HEP and recommendations.   OBSERVATIONS:   Eval: Surgical site is clean and no overt signs of infection, no drainage, signs of dehiscence, etc.  Tenderness and swelling is within normal limits for post-op timeframe.      TODAY'S TREATMENT:  09/10/24: *** Check status, perform progress note, check new therapy putty strengthening, likely discharge if all goals are met   08/26/24: He starts with active range of motion for exercise as well as new measures which shows he can now make a good fist, his wrist is moving better.  As he is now 4 weeks or more postop, he is safe to start doing gripping and squeezing activities which he was educated on how to do today with therapy putty.  We review his stretches while he is on moist heat, then he performs the exercises and activities listed below with light tension to medium tension and no added pain.  OT is careful to ensure that none of these things flareup his de Quervain's tenosynovitis.  States understanding all directions, not having much pain, but he also request to follow-up in 2 weeks to ensure that his grip strength is improving.    Exercises/activities performed today - Wrist Prayer Stretch  - 3-4 x daily - 3 reps - 15 seconds hold - Tendon Glides  - 3-4 x daily - 3 reps - 3 second hold - Full finger stretches   - 3-4 x daily - 3 reps - 15 second hold - Push your knuckles down, pull back hand to feel a stretch  - 3-4 x daily - 3 reps - 15 second hold - Stretch thumb toward base of small finger (put hand in LAP)  - 2-3 x daily - 3-5 reps - 15 sec hold - Full Fist  - 2-3 x daily - 3 reps - 10 sec hold - Seated Claw Fist with Putty  - 3 x daily - 3 reps - 10 sec hold - Duck Mouth Strength  - 3 x daily - 3 reps - 10 sec hold - Finger Extension Pizza!   - 2-3 x daily - 3 reps - 10 sec hold - Thumb Press  - 3 x daily - 3 reps - 10 sec hold      PATIENT EDUCATION: Education details: See tx section above for details  Person educated: Patient Education method: Verbal Instruction, Teach back, Handouts  Education comprehension: States and demonstrates understanding, additional education required   HOME EXERCISE PROGRAM: Access Code: [email or web address] URL:  [email or web address] Date: 08/14/2024 Prepared by: Melvenia Ada   GOALS: Goals reviewed with patient? Yes   SHORT TERM GOALS: (STG required if POC>30 days) Target Date: 09/05/24  1.  Pt will demo/state understanding of initial HEP and therapist recommendations to improve pain levels, improve motions and ability and eventually return to normal activities.   Goal status: 08/14/24 MET  2.  In 2 weeks, patient will tolerate gripping and pinching with therapy putty and learned a home exercise program to improve his ability and status for IADLs.  GOAL STATUS: 09/10/24: ***     ASSESSMENT:  CLINICAL IMPRESSION: 09/10/24: ***  08/26/24: Doing very well and improving.  Not having any significant pain and definitely no triggering.  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  He wishes to follow-up again in 2 weeks at which point he will need a progress note and likely discharge therapy.   PLAN:  OT FREQUENCY: Up to 1 time a week  OT DURATION: 4 total sessions as needed for 3 total weeks through 09/05/2024  PLANNED INTERVENTIONS: self care/ADL training, therapeutic exercise, therapeutic activity, neuromuscular re-education, manual therapy, scar mobilization, passive range of motion, splinting, ultrasound, fluidotherapy, compression bandaging, moist heat, cryotherapy, contrast bath, patient/family education, energy conservation, coping strategies training, and Re-evaluation  RECOMMENDED OTHER SERVICES: Is in physical therapy for recent right shoulder pain.  PT is working with OT to ensure no exacerbation of the right hand  CONSULTED AND AGREED WITH PLAN OF CARE: Patient  PLAN FOR NEXT SESSION:   ***   Melvenia Ada, OTR/L, CHT 09/08/2024, 4:44 PM

## 2024-09-09 ENCOUNTER — Encounter: Admitting: Physical Therapy

## 2024-09-09 NOTE — Therapy (Incomplete)
 OUTPATIENT PHYSICAL THERAPY UPPER EXTREMITY TREATMENT   Patient Name: Jose [name] MRN: [id number] DOB:19-Oct-1951, 73 y.o., male Today's Date: 09/09/2024  END OF SESSION:        Past Medical History:  Diagnosis Date   Arthritis    R shoulder, great toes, hands- has gout & has injections in knees with Dr. [name]    Bicuspid aortic valve with ascending aorta 4.0 to 4.5 cm in diameter    CAD (coronary artery disease)    Cancer (HCC)    skin ca-    GERD (gastroesophageal reflux disease)    Headache    History of hiatal hernia    Hypertension    Prostate cancer (HCC)    S/P TAVR (transcatheter aortic valve replacement)    SDH (subdural hematoma) (HCC)    Severe aortic stenosis    Thoracic ascending aortic aneurysm    Vertebral artery aneurysm    Past Surgical History:  Procedure Laterality Date   BRAIN SURGERY  2016   evacuation of SDH   CARDIAC CATHETERIZATION     CARPAL TUNNEL RELEASE Bilateral    embolization of left vertebral artery aneurysm with pipeline/coil  10/31/2022   GREEN LIGHT LASER TURP (TRANSURETHRAL RESECTION OF PROSTATE  04/27/2017   KNEE ARTHROPLASTY     LEFT HEART CATH AND CORONARY ANGIOGRAPHY N/A 10/08/2019   Procedure: LEFT HEART CATH AND CORONARY ANGIOGRAPHY;  Surgeon: Wonda Sharper, MD;  Location: [facility] INVASIVE CV LAB;  Service: Cardiovascular;  Laterality: N/A;   QUADRICEPS TENDON REPAIR Bilateral 06/15/2017   Procedure: REPAIR QUADRICEP TENDON;  Surgeon: Addie Cordella Glendia, MD;  Location: [facility] OR;  Service: Orthopedics;  Laterality: Bilateral;   SHOULDER SURGERY Right    TEE WITHOUT CARDIOVERSION N/A 07/03/2022   Procedure: TRANSESOPHAGEAL ECHOCARDIOGRAM (TEE);  Surgeon: Delford Maude BROCKS, MD;  Location: [facility] ENDOSCOPY;  Service: Cardiovascular;  Laterality: N/A;   TRANSCATHETER AORTIC VALVE REPLACEMENT, TRANSFEMORAL  12/23/2019   TRANSCATHETER AORTIC VALVE REPLACEMENT, TRANSFEMORAL N/A 12/23/2019   Procedure: TRANSCATHETER AORTIC VALVE  REPLACEMENT, TRANSFEMORAL;  Surgeon: Wonda Sharper, MD;  Location: [facility] OR;  Service: Open Heart Surgery;  Laterality: N/A;   VASCULAR SURGERY     Patient Active Problem List   Diagnosis Date Noted   Hypertension    Severe aortic stenosis 10/08/2019   Candidiasis    Abnormality of gait    Hypoalbuminemia due to protein-calorie malnutrition    History of gout    Sleep disturbance    Constipation    Rupture of quadriceps tendon, right, sequela 06/19/2017   Quadriceps tendon rupture, left, sequela 06/19/2017   Acute blood loss anemia 06/19/2017   Fall    History of subdural hematoma    Post-operative pain    Recurrent falls    Quadriceps tendon rupture 06/15/2017   Primary osteoarthritis of both knees 02/28/2017   Primary osteoarthritis of both hands 02/28/2017   Uricacidemia 02/28/2017   Pain in joint of left knee 02/07/2017   Idiopathic chronic gout, unspecified site, without tophus (tophi) 11/29/2016   HEMATURIA UNSPECIFIED 01/25/2009   Abdominal pain 01/25/2009   XERODERMA 03/10/2008   UNS ADVRS EFF UNS RX MEDICINAL&BIOLOGICAL SBSTNC 03/10/2008   ADJUSTMENT REACTION WITH PHYSICAL SYMPTOMS 02/14/2008   MUSCLE SPASM 02/14/2008   ACUTE PROSTATITIS 12/16/2007   BREAST LUMP OR MASS, RIGHT 07/12/2007   H/O: RCT (rotator cuff tear) 01/13/2007   GOUT 01/08/2007    PCP: Birda Ahumada, MD  REFERRING PROVIDER: Birda Ahumada, MD  REFERRING DIAG: [phone] (ICD-10-CM) - Chronic right shoulder painM19.011 (ICD-10-CM) -  Primary osteoarthritis, right shoulder  THERAPY DIAG:  No diagnosis found.  Rationale for Evaluation and Treatment: Rehabilitation  ONSET DATE: referral 07/30/2024, after trigger finger release on 8/72025  SUBJECTIVE:                                                                                                                                                                                      SUBJECTIVE STATEMENT: ***   He has been doing his updated  HEP.  He woke up this morning with some soreness in shoulder joint but went away with movement.  Hand dominance: Right  PERTINENT HISTORY: polio, RUE trigger finger release 07/24/2024,  aortic valve replacement 2021, bil quad tendorn repair 2018 w/left reconstruction 2020, OA, gout, CAD, skin & prostate CA, right breast lump or mass, HTN, SDH with surgery 2016, Thoracic ascending aortic aneurysm, vertebral artery aneurysm, knee arthroplasty  DIAGNOSTIC FINDINGS: RIGHT SHOULDER - 2+ VIEW  IMPRESSION: 1. Advanced glenohumeral osteoarthritis. 2. Soft tissue calcifications in the region of the rotator cuff insertion, can be seen with calcific tendinopathy.  PAIN:  Are you having pain? Yes: NPRS scale: since last PT up to ***   4/10 Pain location: right shoulder more joint anteriorly Pain description: achy and sharp, random Aggravating factors: movements Relieving factors: rest, heat, ice, medication  PRECAUTIONS: None  RED FLAGS: None   WEIGHT BEARING RESTRICTIONS: No  FALLS:  Has patient fallen in last 6 months? No  LIVING ENVIRONMENT: Lives with: lives with partner Lives in: House Stairs: Yes: Internal: 17 steps; on left going up and External: 2 steps; none (office & gym are upstairs;  master bedroom & bath on main level) Has following equipment at home: None  OCCUPATION: part-time consulting  PLOF: Independent  PATIENT GOALS: strengthening  NEXT MD VISIT: tbd  OBJECTIVE:  Note: Objective measures were completed at Evaluation unless otherwise noted.  Patient-Specific Activity Scoring Scheme  0 represents "unable to perform." 10 represents "able to perform at prior level. 0 1 2 3 4 5 6 7 8 9  10 (Date and Score)  Activity Eval   09/04/24  1. exercising  3  6.5  2. lifting 3  4  3. yardwork 0 0  4.    5.    Score 2 3.5   Total score = sum of the activity scores/number of activities Minimum detectable change (90%CI) for average score = 2 points Minimum  detectable change (90%CI) for single activity score = 3 points  COGNITION: Overall cognitive status: Within functional limits for tasks assessed     SENSATION: WFL  POSTURE: round shoulder, forward head   UPPER EXTREMITY ROM:   ROM Right eval  Shoulder flexion Seated P: 142* painful A: 132*  Shoulder extension Standing P: 38* painful A: 36*  Shoulder abduction Seated P: 57* painful A: 43* painful  Shoulder horizontal adduction Supine: A: 12*  Shoulder internal rotation Supine A: 52* painful P: 57* painful  Shoulder external rotation Supine P: 37* painful Sidelying A:  painful  Elbow flexion   Elbow extension   Wrist flexion   Wrist extension   Wrist ulnar deviation   Wrist radial deviation   Wrist pronation   Wrist supination   (Blank rows = not tested)  UPPER EXTREMITY MMT:  MMT Right eval Left eval  Shoulder flexion 3-/5   Shoulder extension    Shoulder abduction 3-/5   Shoulder adduction    Shoulder internal rotation 3-/5   Shoulder external rotation 3-/5   Middle trapezius    Lower trapezius    Elbow flexion    Elbow extension    Wrist flexion    Wrist extension    Wrist ulnar deviation    Wrist radial deviation    Wrist pronation    Wrist supination    Grip strength (lbs)    (Blank rows = not tested)  SHOULDER SPECIAL TESTS: not tested  JOINT MOBILITY TESTING:  Not tested  PALPATION:  Not tested  TODAY'S  TREATMENT:                                                                                                       DATE: 09/09/2024 Therapeutic Activities:  AAROM ***     TREATMENT:                                                                                                       DATE: 09/04/2024 Therapeutic Activities:  AAROM seated pulley flexion 3 minutes and abduction 3 minutes. He ordered pulleys for HEP.  Left side lying RUE ER against gravity PT tactile & verbal cues on scapular retraction first to limit winging  10  reps no weight, 10 reps 2 sets with 1#.  PT cueing shoulder posture.  Left side lying RUE shoulder ext then internal rotation; he is only able to get hand to buttocks. 5 reps 3 sets with PT tactile cues for scapula / GH control Supine with half foam roll along spine for stretch and larger range:  RUE & BUE chest press with end serratus punch with RUE 1# 10 reps 2 sets.  PT cues on technique for scapula / GH control Supine with half foam roll along spine for stretch and larger range: horizontal adduction with end range pectoralis stretch RUE 1# 10  reps 2 sets.  PT cues on technique  for scapula / GH control Supine with half foam roll along spine for stretch and larger range: wand BUE shoulder flexion 10  reps 2 sets.  PT cues on technique  for scapula / GH control Supine biceps curl 1# 10 reps 1st set palm up, 2nd set palm neutral, 3rd palm down.  PT cues on technique  for scapula / GH control especially at end of eccentric motion. Supine triceps press 1# 10 reps 2 sets. PT cues on technique  for scapula / GH control especially at end of eccentric motion. Standing green theraband rows 10 reps 2 sets. PT cues on technique  for scapula / GH control.   Pt verbalized understanding of HEP.     TREATMENT:                                                                                                       DATE: 09/02/2024 Therapeutic Activities:  UBE BUEs only seat 11 level 3 for 3.5 min ea direction. Cues on posture Cervical retraction standing with pillow on door frame.   Scapular retraction with mirror for visual feedback to posture.  15 reps with tactile cues. Standing green then red theraband reactive ER 5 reps ea level and active ER red 5 reps PT cues for posture and motion including right grip.  PT palpating right shoulder with popcorn motion in anterior area.    Left side lying RUE ER against gravity 10 reps no weight, 10 reps 2 sets with 1#.  PT cueing shoulder posture.  Prone scapular  retraction against gravity field goal position and arms at sides 10 reps 3 sets of each.  Supine with half foam roll along spine for stretch and larger range:  RUE & BUE chest press with end serratus punch 10 reps 2 sets.  PT cues on technique Supine with half foam roll along spine for stretch and larger range: horizontal adduction with end range pectoralis stretch 10  reps 2 sets.  PT cues on technique  PT updated HEP including HO, demo & verbal cues with above exercises.  Pt verbalized understanding.    TREATMENT:                                                                                                       DATE: 08/27/2024 Therapeutic Exercise:  AAROM seated pulley flexion 2 minutes and abduction 2 minutes.  PT recommending for flexibility and using muscles in higher ranges. PT recommending seated on chair not Swiss ball like previously. So he does not over use hand if he has a balance loss.  Pt verbalized understanding.  Standing blue theraband (L4) PT cues for posture  and motion including right grip:  punch / forward reach RUE only & rows BUEs 15 reps 2 sets Standing RUE reactive flex, ext, IR & ER blue theraband (L4) PT cues for posture and motion including right grip 10 reps 2 sets  PT updated HEP including HO, demo & verbal cues with above exercises.  Pt verbalized understanding.     PATIENT EDUCATION: Education details: HEP, POC Person educated: Patient Education method: Programmer, multimedia, Demonstration, Verbal cues, and Handouts Education comprehension: verbalized understanding, returned demonstration, and verbal cues required  HOME EXERCISE PROGRAM: Access Code: [email or web address] URL: [email or web address] Date: 09/02/2024 Prepared by: Grayce Spatz  Exercises - Isometric Shoulder Flexion at Wall  - 1-2 x daily - 7 x weekly - 2 sets - 10 reps - 5 seconds hold - Isometric Shoulder Extension at Wall  - 1-2 x daily - 7 x weekly - 2 sets - 10 reps - 5 seconds hold -  Isometric Shoulder Abduction at Wall  - 1-2 x daily - 7 x weekly - 2 sets - 10 reps - 5 seconds hold - Isometric Shoulder Adduction  - 1-2 x daily - 7 x weekly - 2 sets - 10 reps - 5 seconds hold - Standing Isometric Shoulder Internal Rotation at Doorway  - 1-2 x daily - 7 x weekly - 2 sets - 10 reps - 5 seconds hold - Standing Isometric Shoulder External Rotation with Doorway  - 1-2 x daily - 7 x weekly - 2 sets - 10 reps - 5 seconds hold - Standing Row with Anchored Resistance  - 1 x daily - 4-7 x weekly - 2 sets - 15 reps - 5 seconds hold - forward reach  - 1 x daily - 4-7 x weekly - 2 sets - 15 reps - 5 seconds hold - Shoulder External Rotation Walkouts with Anchored Resistance  - 1 x daily - 4-7 x weekly - 2 sets - 10 reps - 5 seconds hold - Shoulder Internal Rotation Reactive Isometrics  - 1 x daily - 4-7 x weekly - 2 sets - 10 reps - 5 seconds hold - walking balance with band behind you  - 1 x daily - 4-7 x weekly - 2 sets - 10 reps - 5 seconds hold - Standing Shoulder Row Reactive Isometric  - 1 x daily - 4-7 x weekly - 2 sets - 10 reps - 5 seconds hold - Seated Shoulder Flexion AAROM with Pulley Behind  - 1 x daily - 4-7 x weekly - 1 sets - 2-5 minuters hold - Seated Shoulder Abduction AAROM with Pulley Behind  - 1 x daily - 4-7 x weekly - 1 sets - 2-5 minutes hold - Sidelying Shoulder External Rotation  - 1 x daily - 4-7 x weekly - 2-3 sets - 10 reps - 5 seconds hold - Prone W Scapular Retraction  - 1 x daily - 4 x weekly - 2-3 sets - 10 reps - 5 seconds hold - Prone Scapular Slide with Shoulder Extension  - 1 x daily - 4 x weekly - 2-3 sets - 10 reps - 5 seconds hold - Supine Alternating Arm Punches on Foam Roll  - 1 x daily - 4 x weekly - 2-3 sets - 10 reps - 5 seconds hold - supine chest press with punch upward at end  - 1 x daily - 4 x weekly - 2-3 sets - 10 reps - 5 seconds hold - Supine Shoulder Horizontal Abduction with Dumbbells  - 1 x daily -  4 x weekly - 2-3 sets - 10 reps - 5  seconds hold    ASSESSMENT:  CLINICAL IMPRESSION: ***   Patient is reporting less pain.  He is able to improve scapular control with shoulder motions with heavy focus & PT cues.    OBJECTIVE IMPAIRMENTS: decreased activity tolerance, decreased coordination, decreased endurance, decreased mobility, decreased ROM, decreased strength, impaired flexibility, impaired UE functional use, postural dysfunction, and pain.   ACTIVITY LIMITATIONS: carrying, lifting, bed mobility, bathing, dressing, reach over head, and hygiene/grooming  PARTICIPATION LIMITATIONS: meal prep, cleaning, community activity, and yard work  PERSONAL FACTORS: Past/current experiences, Time since onset of injury/illness/exacerbation, and 3+ comorbidities: see PMH are also affecting patient's functional outcome.   REHAB POTENTIAL: Good  CLINICAL DECISION MAKING: Evolving/moderate complexity  EVALUATION COMPLEXITY: Moderate   GOALS: Goals reviewed with patient? Yes  SHORT TERM GOALS: (target date for Short term goals are 3 weeks: 09/04/2024)  1.Patient will demonstrate independent use of home exercise program to maintain progress from in clinic treatments. Baseline: SEE OBJECTIVE DATA Goal status:  MET 09/04/2024  LONG TERM GOALS: (target dates for all long term goals are 8 weeks: 10/09/2024)   1. Patient will demonstrate/report at worst less than or equal to 1/10 to facilitate minimal limitation in daily activity secondary to pain symptoms. Baseline: SEE OBJECTIVE DATA Goal status: Ongoing   09/09/2024   2. Patient will demonstrate independent use of home exercise program to facilitate ability to maintain/progress functional gains from skilled physical therapy services. Baseline: SEE OBJECTIVE DATA Goal status: Ongoing   09/09/2024   3.  Patient reports Patient-Specific Activity Score improved to >/= 5 to indicate improvement in functional activities.  Baseline: SEE OBJECTIVE DATA Goal status: Ongoing    09/09/2024   4.  Patient will demonstrate Rt shoulder MMT >/=4/5 throughout to facilitate lifting, reaching, carrying at [facility] in daily activity.  Baseline: SEE OBJECTIVE DATA Goal status: Ongoing   09/09/2024   5.  Patient will demonstrate Rt shoulder AROM WFL s symptoms to facilitate usual overhead reaching, self care, dressing at PLOF.  Baseline: SEE OBJECTIVE DATA Goal status: Ongoing   09/09/2024   PLAN: PT FREQUENCY: 2x/week  PT DURATION: 8 weeks  PLANNED INTERVENTIONS: 97164- PT Re-evaluation, 97750- Physical Performance Testing, 97110-Therapeutic exercises, 97530- Therapeutic activity, [id number]- Neuromuscular re-education, 97535- Self Care, [id number]- Manual therapy, Patient/Family education, and Joint mobilization  PLAN FOR NEXT SESSION: ***  work towards LTGs.  Continue to emphasize scapular control with shoulder motions.      Grayce Spatz, PT, DPT 09/09/2024, 7:02 AM

## 2024-09-10 ENCOUNTER — Telehealth: Payer: Self-pay | Admitting: Rehabilitative and Restorative Service Providers"

## 2024-09-10 ENCOUNTER — Encounter: Admitting: Rehabilitative and Restorative Service Providers"

## 2024-09-10 NOTE — Telephone Encounter (Signed)
 OT called patient to discuss missed appointment. OT left message with pt about having no future appointments and to ask about recent complaints of hand swelling.  He was asked to call back to schedule as needed to help with his issues/concerns.

## 2024-09-11 ENCOUNTER — Ambulatory Visit (INDEPENDENT_AMBULATORY_CARE_PROVIDER_SITE_OTHER): Admitting: Physical Therapy

## 2024-09-11 ENCOUNTER — Encounter: Payer: Self-pay | Admitting: Physical Therapy

## 2024-09-11 DIAGNOSIS — M6281 Muscle weakness (generalized): Secondary | ICD-10-CM

## 2024-09-11 DIAGNOSIS — M25511 Pain in right shoulder: Secondary | ICD-10-CM | POA: Diagnosis not present

## 2024-09-11 DIAGNOSIS — M25611 Stiffness of right shoulder, not elsewhere classified: Secondary | ICD-10-CM

## 2024-09-11 DIAGNOSIS — R600 Localized edema: Secondary | ICD-10-CM

## 2024-09-11 DIAGNOSIS — G8929 Other chronic pain: Secondary | ICD-10-CM

## 2024-09-11 NOTE — Therapy (Signed)
 OUTPATIENT PHYSICAL THERAPY UPPER EXTREMITY TREATMENT   Patient Name: Jose [name] MRN: [id number] DOB:1951-07-04, 73 y.o., male Today's Date: 09/11/2024  END OF SESSION:  PT End of Session - 09/11/24 0803     Visit Number 6    Number of Visits 16    Date for Recertification  10/09/24    Authorization Type Medicare A/B & BCBS supp    Progress Note Due on Visit 10    PT Start Time 0800    PT Stop Time 0838    PT Time Calculation (min) 38 min    Activity Tolerance Patient tolerated treatment well;Patient limited by pain    Behavior During Therapy WFL for tasks assessed/performed               Past Medical History:  Diagnosis Date   Arthritis    R shoulder, great toes, hands- has gout & has injections in knees with Jose [name]    Bicuspid aortic valve with ascending aorta 4.0 to 4.5 cm in diameter    CAD (coronary artery disease)    Cancer (HCC)    skin ca-    GERD (gastroesophageal reflux disease)    Headache    History of hiatal hernia    Hypertension    Prostate cancer (HCC)    S/P TAVR (transcatheter aortic valve replacement)    SDH (subdural hematoma) (HCC)    Severe aortic stenosis    Thoracic ascending aortic aneurysm    Vertebral artery aneurysm    Past Surgical History:  Procedure Laterality Date   BRAIN SURGERY  2016   evacuation of SDH   CARDIAC CATHETERIZATION     CARPAL TUNNEL RELEASE Bilateral    embolization of left vertebral artery aneurysm with pipeline/coil  10/31/2022   GREEN LIGHT LASER TURP (TRANSURETHRAL RESECTION OF PROSTATE  04/27/2017   KNEE ARTHROPLASTY     LEFT HEART CATH AND CORONARY ANGIOGRAPHY N/A 10/08/2019   Procedure: LEFT HEART CATH AND CORONARY ANGIOGRAPHY;  Surgeon: Jose Sharper, MD;  Location: [facility] INVASIVE CV LAB;  Service: Cardiovascular;  Laterality: N/A;   QUADRICEPS TENDON REPAIR Bilateral 06/15/2017   Procedure: REPAIR QUADRICEP TENDON;  Surgeon: Jose Cordella Glendia, MD;  Location: [facility] OR;  Service: Orthopedics;   Laterality: Bilateral;   SHOULDER SURGERY Right    TEE WITHOUT CARDIOVERSION N/A 07/03/2022   Procedure: TRANSESOPHAGEAL ECHOCARDIOGRAM (TEE);  Surgeon: Jose Maude BROCKS, MD;  Location: [facility] ENDOSCOPY;  Service: Cardiovascular;  Laterality: N/A;   TRANSCATHETER AORTIC VALVE REPLACEMENT, TRANSFEMORAL  12/23/2019   TRANSCATHETER AORTIC VALVE REPLACEMENT, TRANSFEMORAL N/A 12/23/2019   Procedure: TRANSCATHETER AORTIC VALVE REPLACEMENT, TRANSFEMORAL;  Surgeon: Jose Sharper, MD;  Location: [facility] OR;  Service: Open Heart Surgery;  Laterality: N/A;   VASCULAR SURGERY     Patient Active Problem List   Diagnosis Date Noted   Hypertension    Severe aortic stenosis 10/08/2019   Candidiasis    Abnormality of gait    Hypoalbuminemia due to protein-calorie malnutrition    History of gout    Sleep disturbance    Constipation    Rupture of quadriceps tendon, right, sequela 06/19/2017   Quadriceps tendon rupture, left, sequela 06/19/2017   Acute blood loss anemia 06/19/2017   Fall    History of subdural hematoma    Post-operative pain    Recurrent falls    Quadriceps tendon rupture 06/15/2017   Primary osteoarthritis of both knees 02/28/2017   Primary osteoarthritis of both hands 02/28/2017   Uricacidemia 02/28/2017   Pain in joint  of left knee 02/07/2017   Idiopathic chronic gout, unspecified site, without tophus (tophi) 11/29/2016   HEMATURIA UNSPECIFIED 01/25/2009   Abdominal pain 01/25/2009   XERODERMA 03/10/2008   UNS ADVRS EFF UNS RX MEDICINAL&BIOLOGICAL SBSTNC 03/10/2008   ADJUSTMENT REACTION WITH PHYSICAL SYMPTOMS 02/14/2008   MUSCLE SPASM 02/14/2008   ACUTE PROSTATITIS 12/16/2007   BREAST LUMP OR MASS, RIGHT 07/12/2007   H/O: RCT (rotator cuff tear) 01/13/2007   GOUT 01/08/2007    PCP: Jose Ahumada, MD  REFERRING PROVIDER: Arlinda Buster, MD  REFERRING DIAG: M25.511,G89.29 (ICD-10-CM) - Chronic right shoulder painM19.011 (ICD-10-CM) - Primary osteoarthritis, right  shoulder  THERAPY DIAG:  Muscle weakness (generalized)  Stiffness of right shoulder, not elsewhere classified  Localized edema  Chronic right shoulder pain  Rationale for Evaluation and Treatment: Rehabilitation  ONSET DATE: referral 07/30/2024, after trigger finger release on 8/72025  SUBJECTIVE:                                                                                                                                                                                      SUBJECTIVE STATEMENT: He cancelled appt 2 days ago due to swelling hand.  Monday (3 days ago) he drove to [place] 1.5 hrs both ways.  Because his left shoulder is weak from Polio he primarily uses right hand.  The swelling finally subsided last night.   Hand dominance: Right  PERTINENT HISTORY: polio, RUE trigger finger release 07/24/2024,  aortic valve replacement 2021, bil quad tendorn repair 2018 w/left reconstruction 2020, OA, gout, CAD, skin & prostate CA, right breast lump or mass, HTN, SDH with surgery 2016, Thoracic ascending aortic aneurysm, vertebral artery aneurysm, knee arthroplasty  DIAGNOSTIC FINDINGS: RIGHT SHOULDER - 2+ VIEW  IMPRESSION: 1. Advanced glenohumeral osteoarthritis. 2. Soft tissue calcifications in the region of the rotator cuff insertion, can be seen with calcific tendinopathy.  PAIN:  Are you having pain? Yes: NPRS scale: since last PT up to 3/10 shoulder, 0/10 hand Pain location: right shoulder more joint anteriorly Pain description:  static  Aggravating factors: movements Relieving factors: rest, heat, ice, medication  PRECAUTIONS: None  RED FLAGS: None   WEIGHT BEARING RESTRICTIONS: No  FALLS:  Has patient fallen in last 6 months? No  LIVING ENVIRONMENT: Lives with: lives with partner Lives in: House Stairs: Yes: Internal: 17 steps; on left going up and External: 2 steps; none (office & gym are upstairs;  master bedroom & bath on main level) Has following  equipment at home: None  OCCUPATION: part-time consulting  PLOF: Independent  PATIENT GOALS: strengthening  NEXT MD VISIT: tbd  OBJECTIVE:  Note: Objective measures were completed at Evaluation unless otherwise noted.  Patient-Specific Activity Scoring Scheme  0 represents "unable to perform." 10 represents "able to perform at prior level. 0 1 2 3 4 5 6 7 8 9  10 (Date and Score)  Activity Eval   09/04/24  1. exercising  3  6.5  2. lifting 3  4  3. yardwork 0 0  4.    5.    Score 2 3.5   Total score = sum of the activity scores/number of activities Minimum detectable change (90%CI) for average score = 2 points Minimum detectable change (90%CI) for single activity score = 3 points  COGNITION: Overall cognitive status: Within functional limits for tasks assessed     SENSATION: WFL  POSTURE: round shoulder, forward head   UPPER EXTREMITY ROM:   ROM Right eval  Shoulder flexion Seated P: 142* painful A: 132*  Shoulder extension Standing P: 38* painful A: 36*  Shoulder abduction Seated P: 57* painful A: 43* painful  Shoulder horizontal adduction Supine: A: 12*  Shoulder internal rotation Supine A: 52* painful P: 57* painful  Shoulder external rotation Supine P: 37* painful Sidelying A:  painful  Elbow flexion   Elbow extension   Wrist flexion   Wrist extension   Wrist ulnar deviation   Wrist radial deviation   Wrist pronation   Wrist supination   (Blank rows = not tested)  UPPER EXTREMITY MMT:  MMT Right eval Left eval  Shoulder flexion 3-/5   Shoulder extension    Shoulder abduction 3-/5   Shoulder adduction    Shoulder internal rotation 3-/5   Shoulder external rotation 3-/5   Middle trapezius    Lower trapezius    Elbow flexion    Elbow extension    Wrist flexion    Wrist extension    Wrist ulnar deviation    Wrist radial deviation    Wrist pronation    Wrist supination    Grip strength (lbs)    (Blank rows = not  tested)  SHOULDER SPECIAL TESTS: not tested  JOINT MOBILITY TESTING:  Not tested  PALPATION:  Not tested  TODAY'S  TREATMENT:                                                                                                       DATE: 09/11/2024 Therapeutic Activities:  AAROM seated pulley flexion 3 minutes and abduction 3 minutes. He ordered pulleys for HEP.  Walkout reactive isometric blue theraband 10 reps followed by resistive of same motion 10 reps.  ER, ext, IR, flex.  Pt verbalized understanding of rationale for stabilization followed by active strengthening to facilitate scapular stabilization with use.     Self-Care: PT demo & verbal cues on using Yoga block in car to position LUE to facilitate ability for LUE assistance to grip steering wheel for longer distance drives. Pt verbalized understanding. Spurgeon Ada, OT, CHT also spoke to pt regarding driving recommendations.      TREATMENT:  DATE: 09/04/2024 Therapeutic Activities:  AAROM seated pulley flexion 3 minutes and abduction 3 minutes. He ordered pulleys for HEP.  Left side lying RUE ER against gravity PT tactile & verbal cues on scapular retraction first to limit winging  10 reps no weight, 10 reps 2 sets with 1#.  PT cueing shoulder posture.  Left side lying RUE shoulder ext then internal rotation; he is only able to get hand to buttocks. 5 reps 3 sets with PT tactile cues for scapula / GH control Supine with half foam roll along spine for stretch and larger range:  RUE & BUE chest press with end serratus punch with RUE 1# 10 reps 2 sets.  PT cues on technique for scapula / GH control Supine with half foam roll along spine for stretch and larger range: horizontal adduction with end range pectoralis stretch RUE 1# 10  reps 2 sets.  PT cues on technique  for scapula / GH control Supine with half foam roll along spine for  stretch and larger range: wand BUE shoulder flexion 10  reps 2 sets.  PT cues on technique  for scapula / GH control Supine biceps curl 1# 10 reps 1st set palm up, 2nd set palm neutral, 3rd palm down.  PT cues on technique  for scapula / GH control especially at end of eccentric motion. Supine triceps press 1# 10 reps 2 sets. PT cues on technique  for scapula / GH control especially at end of eccentric motion. Standing green theraband rows 10 reps 2 sets. PT cues on technique  for scapula / GH control.   Pt verbalized understanding of HEP.     TREATMENT:                                                                                                       DATE: 09/02/2024 Therapeutic Activities:  UBE BUEs only seat 11 level 3 for 3.5 min ea direction. Cues on posture Cervical retraction standing with pillow on door frame.   Scapular retraction with mirror for visual feedback to posture.  15 reps with tactile cues. Standing green then red theraband reactive ER 5 reps ea level and active ER red 5 reps PT cues for posture and motion including right grip.  PT palpating right shoulder with popcorn motion in anterior area.    Left side lying RUE ER against gravity 10 reps no weight, 10 reps 2 sets with 1#.  PT cueing shoulder posture.  Prone scapular retraction against gravity field goal position and arms at sides 10 reps 3 sets of each.  Supine with half foam roll along spine for stretch and larger range:  RUE & BUE chest press with end serratus punch 10 reps 2 sets.  PT cues on technique Supine with half foam roll along spine for stretch and larger range: horizontal adduction with end range pectoralis stretch 10  reps 2 sets.  PT cues on technique  PT updated HEP including HO, demo & verbal cues with above exercises.  Pt verbalized understanding.    TREATMENT:  DATE: 08/27/2024 Therapeutic  Exercise:  AAROM seated pulley flexion 2 minutes and abduction 2 minutes.  PT recommending for flexibility and using muscles in higher ranges. PT recommending seated on chair not Swiss ball like previously. So he does not over use hand if he has a balance loss.  Pt verbalized understanding.  Standing blue theraband (L4) PT cues for posture and motion including right grip:  punch / forward reach RUE only & rows BUEs 15 reps 2 sets Standing RUE reactive flex, ext, IR & ER blue theraband (L4) PT cues for posture and motion including right grip 10 reps 2 sets  PT updated HEP including HO, demo & verbal cues with above exercises.  Pt verbalized understanding.     PATIENT EDUCATION: Education details: HEP, POC Person educated: Patient Education method: Programmer, multimedia, Demonstration, Verbal cues, and Handouts Education comprehension: verbalized understanding, returned demonstration, and verbal cues required  HOME EXERCISE PROGRAM: Access Code: [email or web address] URL: [email or web address] Date: 09/02/2024 Prepared by: Grayce Spatz  Exercises - Isometric Shoulder Flexion at Wall  - 1-2 x daily - 7 x weekly - 2 sets - 10 reps - 5 seconds hold - Isometric Shoulder Extension at Wall  - 1-2 x daily - 7 x weekly - 2 sets - 10 reps - 5 seconds hold - Isometric Shoulder Abduction at Wall  - 1-2 x daily - 7 x weekly - 2 sets - 10 reps - 5 seconds hold - Isometric Shoulder Adduction  - 1-2 x daily - 7 x weekly - 2 sets - 10 reps - 5 seconds hold - Standing Isometric Shoulder Internal Rotation at Doorway  - 1-2 x daily - 7 x weekly - 2 sets - 10 reps - 5 seconds hold - Standing Isometric Shoulder External Rotation with Doorway  - 1-2 x daily - 7 x weekly - 2 sets - 10 reps - 5 seconds hold - Standing Row with Anchored Resistance  - 1 x daily - 4-7 x weekly - 2 sets - 15 reps - 5 seconds hold - forward reach  - 1 x daily - 4-7 x weekly - 2 sets - 15 reps - 5 seconds hold - Shoulder External Rotation  Walkouts with Anchored Resistance  - 1 x daily - 4-7 x weekly - 2 sets - 10 reps - 5 seconds hold - Shoulder Internal Rotation Reactive Isometrics  - 1 x daily - 4-7 x weekly - 2 sets - 10 reps - 5 seconds hold - walking balance with band behind you  - 1 x daily - 4-7 x weekly - 2 sets - 10 reps - 5 seconds hold - Standing Shoulder Row Reactive Isometric  - 1 x daily - 4-7 x weekly - 2 sets - 10 reps - 5 seconds hold - Seated Shoulder Flexion AAROM with Pulley Behind  - 1 x daily - 4-7 x weekly - 1 sets - 2-5 minuters hold - Seated Shoulder Abduction AAROM with Pulley Behind  - 1 x daily - 4-7 x weekly - 1 sets - 2-5 minutes hold - Sidelying Shoulder External Rotation  - 1 x daily - 4-7 x weekly - 2-3 sets - 10 reps - 5 seconds hold - Prone W Scapular Retraction  - 1 x daily - 4 x weekly - 2-3 sets - 10 reps - 5 seconds hold - Prone Scapular Slide with Shoulder Extension  - 1 x daily - 4 x weekly - 2-3 sets - 10 reps - 5 seconds hold - Supine Alternating Arm  Punches on Foam Roll  - 1 x daily - 4 x weekly - 2-3 sets - 10 reps - 5 seconds hold - supine chest press with punch upward at end  - 1 x daily - 4 x weekly - 2-3 sets - 10 reps - 5 seconds hold - Supine Shoulder Horizontal Abduction with Dumbbells  - 1 x daily - 4 x weekly - 2-3 sets - 10 reps - 5 seconds hold    ASSESSMENT:  CLINICAL IMPRESSION: PT worked on scapula stabilization with walkouts followed by active resistance of same muscle group.  He had improved scapula control with shoulder movements.    OBJECTIVE IMPAIRMENTS: decreased activity tolerance, decreased coordination, decreased endurance, decreased mobility, decreased ROM, decreased strength, impaired flexibility, impaired UE functional use, postural dysfunction, and pain.   ACTIVITY LIMITATIONS: carrying, lifting, bed mobility, bathing, dressing, reach over head, and hygiene/grooming  PARTICIPATION LIMITATIONS: meal prep, cleaning, community activity, and yard  work  PERSONAL FACTORS: Past/current experiences, Time since onset of injury/illness/exacerbation, and 3+ comorbidities: see PMH are also affecting patient's functional outcome.   REHAB POTENTIAL: Good  CLINICAL DECISION MAKING: Evolving/moderate complexity  EVALUATION COMPLEXITY: Moderate   GOALS: Goals reviewed with patient? Yes  SHORT TERM GOALS: (target date for Short term goals are 3 weeks: 09/04/2024)  1.Patient will demonstrate independent use of home exercise program to maintain progress from in clinic treatments. Baseline: SEE OBJECTIVE DATA Goal status:  MET 09/04/2024  LONG TERM GOALS: (target dates for all long term goals are 8 weeks: 10/09/2024)   1. Patient will demonstrate/report at worst less than or equal to 1/10 to facilitate minimal limitation in daily activity secondary to pain symptoms. Baseline: SEE OBJECTIVE DATA Goal status: Ongoing   09/11/2024   2. Patient will demonstrate independent use of home exercise program to facilitate ability to maintain/progress functional gains from skilled physical therapy services. Baseline: SEE OBJECTIVE DATA Goal status: Ongoing   09/11/2024   3.  Patient reports Patient-Specific Activity Score improved to >/= 5 to indicate improvement in functional activities.  Baseline: SEE OBJECTIVE DATA Goal status: Ongoing   09/11/2024   4.  Patient will demonstrate Rt shoulder MMT >/=4/5 throughout to facilitate lifting, reaching, carrying at [facility] in daily activity.  Baseline: SEE OBJECTIVE DATA Goal status: Ongoing   09/11/2024   5.  Patient will demonstrate Rt shoulder AROM WFL s symptoms to facilitate usual overhead reaching, self care, dressing at PLOF.  Baseline: SEE OBJECTIVE DATA Goal status: Ongoing   09/11/2024   PLAN: PT FREQUENCY: 2x/week  PT DURATION: 8 weeks  PLANNED INTERVENTIONS: 97164- PT Re-evaluation, 97750- Physical Performance Testing, 97110-Therapeutic exercises, 97530- Therapeutic activity, 97112-  Neuromuscular re-education, 97535- Self Care, [id number]- Manual therapy, Patient/Family education, and Joint mobilization  PLAN FOR NEXT SESSION:  check how walkout followed by resistance exercise is going,  work towards LTGs.  Continue to emphasize scapular control with shoulder motions.      Casey Maxfield, PT, DPT 09/11/2024, 8:45 AM

## 2024-09-15 ENCOUNTER — Ambulatory Visit (INDEPENDENT_AMBULATORY_CARE_PROVIDER_SITE_OTHER): Payer: Self-pay | Admitting: Rehabilitative and Restorative Service Providers"

## 2024-09-15 ENCOUNTER — Encounter: Payer: Self-pay | Admitting: Rehabilitative and Restorative Service Providers"

## 2024-09-15 DIAGNOSIS — R600 Localized edema: Secondary | ICD-10-CM

## 2024-09-15 DIAGNOSIS — R278 Other lack of coordination: Secondary | ICD-10-CM

## 2024-09-15 DIAGNOSIS — M6281 Muscle weakness (generalized): Secondary | ICD-10-CM | POA: Diagnosis not present

## 2024-09-15 DIAGNOSIS — M25641 Stiffness of right hand, not elsewhere classified: Secondary | ICD-10-CM

## 2024-09-15 NOTE — Therapy (Signed)
 OUTPATIENT OCCUPATIONAL THERAPY TREATMENT & PROGRESS NOTE Patient Name: Jose [name] MRN: [id number] DOB:July 09, 1951, 73 y.o., male Today's Date: 09/15/2024  REFERRING PROVIDER: Arlinda Buster, MD                Progress Note Reporting Period 09/05/24 to 09/15/24  CLINICAL IMPRESSION: 09/15/24: Unfortunately the patient had an exacerbation of symptoms in his right hand postop.  About 3 weeks ago, he drove for hours on end, not feeling pain but eventually having swelling and pain in the right hand surgical area.  This has slowly gotten better-and he has not been able to return to therapy until today, but did discuss the symptoms with the occupational therapist when he was here working with the physical therapist.  Today his grip strength is overall improved and his range of motion is within functional limits, just tender in the surgical site.  He still shows some swelling.  We manage these things well together today-but he would like to have therapy plan of care left open for any future potential needs within the next month.  OT is in agreement, and will extend his plan of care to, as needed in the next month.  Hopefully he feels good with his home exercise program and does not feel the need to return.    PLAN:  OT FREQUENCY: Up to 1 time a week  OT DURATION: up to 6 additional weeks from 09/05/2024 through 10/17/2024 as needed, and up to 6 total visits.   See note below for Objective Data and Assessment of Progress/Goals.                 END OF SESSION:   Past Medical History:  Diagnosis Date   Arthritis    R shoulder, great toes, hands- has gout & has injections in knees with Dr. [name]    Bicuspid aortic valve with ascending aorta 4.0 to 4.5 cm in diameter    CAD (coronary artery disease)    Cancer (HCC)    skin ca-    GERD (gastroesophageal reflux disease)    Headache    History of hiatal hernia    Hypertension    Prostate cancer (HCC)    S/P  TAVR (transcatheter aortic valve replacement)    SDH (subdural hematoma) (HCC)    Severe aortic stenosis    Thoracic ascending aortic aneurysm    Vertebral artery aneurysm    Past Surgical History:  Procedure Laterality Date   BRAIN SURGERY  2016   evacuation of SDH   CARDIAC CATHETERIZATION     CARPAL TUNNEL RELEASE Bilateral    embolization of left vertebral artery aneurysm with pipeline/coil  10/31/2022   GREEN LIGHT LASER TURP (TRANSURETHRAL RESECTION OF PROSTATE  04/27/2017   KNEE ARTHROPLASTY     LEFT HEART CATH AND CORONARY ANGIOGRAPHY N/A 10/08/2019   Procedure: LEFT HEART CATH AND CORONARY ANGIOGRAPHY;  Surgeon: Wonda Sharper, MD;  Location: [facility] INVASIVE CV LAB;  Service: Cardiovascular;  Laterality: N/A;   QUADRICEPS TENDON REPAIR Bilateral 06/15/2017   Procedure: REPAIR QUADRICEP TENDON;  Surgeon: Addie Cordella Glendia, MD;  Location: [facility] OR;  Service: Orthopedics;  Laterality: Bilateral;   SHOULDER SURGERY Right    TEE WITHOUT CARDIOVERSION N/A 07/03/2022   Procedure: TRANSESOPHAGEAL ECHOCARDIOGRAM (TEE);  Surgeon: Delford Maude BROCKS, MD;  Location: [facility] ENDOSCOPY;  Service: Cardiovascular;  Laterality: N/A;   TRANSCATHETER AORTIC VALVE REPLACEMENT, TRANSFEMORAL  12/23/2019   TRANSCATHETER AORTIC VALVE REPLACEMENT, TRANSFEMORAL N/A 12/23/2019   Procedure: TRANSCATHETER AORTIC VALVE REPLACEMENT, TRANSFEMORAL;  Surgeon: Wonda Sharper, MD;  Location: [facility] OR;  Service: Open Heart Surgery;  Laterality: N/A;   VASCULAR SURGERY     Patient Active Problem List   Diagnosis Date Noted   Hypertension    Severe aortic stenosis 10/08/2019   Candidiasis    Abnormality of gait    Hypoalbuminemia due to protein-calorie malnutrition    History of gout    Sleep disturbance    Constipation    Rupture of quadriceps tendon, right, sequela 06/19/2017   Quadriceps tendon rupture, left, sequela 06/19/2017   Acute blood loss anemia 06/19/2017   Fall    History of subdural hematoma     Post-operative pain    Recurrent falls    Quadriceps tendon rupture 06/15/2017   Primary osteoarthritis of both knees 02/28/2017   Primary osteoarthritis of both hands 02/28/2017   Uricacidemia 02/28/2017   Pain in joint of left knee 02/07/2017   Idiopathic chronic gout, unspecified site, without tophus (tophi) 11/29/2016   HEMATURIA UNSPECIFIED 01/25/2009   Abdominal pain 01/25/2009   XERODERMA 03/10/2008   UNS ADVRS EFF UNS RX MEDICINAL&BIOLOGICAL SBSTNC 03/10/2008   ADJUSTMENT REACTION WITH PHYSICAL SYMPTOMS 02/14/2008   MUSCLE SPASM 02/14/2008   ACUTE PROSTATITIS 12/16/2007   BREAST LUMP OR MASS, RIGHT 07/12/2007   H/O: RCT (rotator cuff tear) 01/13/2007   GOUT 01/08/2007     ONSET DATE:  DOS 07/24/24  REFERRING DIAG:  F34.668 (ICD-10-CM) - Trigger finger, right middle finger  M65.341 (ICD-10-CM) - Trigger finger, right ring finger    THERAPY DIAG:     Muscle weakness (generalized)  Localized edema  Other lack of coordination  Stiffness of right hand, not elsewhere classified  Rationale for Evaluation and Treatment: Rehabilitation  PERTINENT HISTORY:  The patient states hx of triggering and pain in their hand and subsequent surgical release. The patient states having some stiffness, pain, decreased ability to make a fist and perform I/ADLs.     PRECAUTIONS: None relative to this evaluation and episode of care.   RED FLAGS: None   WEIGHT BEARING RESTRICTIONS: Yes: caution with weightbearing for the next 4-6 weeks, recommended less than 5lbs for next 2 weeks with affected hand   SUBJECTIVE:   SUBJECTIVE STATEMENT: The patient is now approx 8 weeks s/p Rt hand RF, MF finger TFRs.  He states approximately 2 to 3 weeks ago, he was driving for hours on end which caused his right surgical hand to swell extremely and become very painful.  He was unable to return to therapy immediately, but now has just returned.  He did discuss this with therapist outside of the  official therapy, and was given some recommendations.  Today he states that he is feeling better but still has some lingering stiffness, pain, swelling in the right hand.    PAIN:  Are you having pain?  None at rest now in thumb/TFR, etc.    PATIENT GOALS: To improve motion, function with affected surgical hand  NEXT MD VISIT: PRN    OBJECTIVE MEASURES:   ADLs: Overall ADLs: States decreased ability to grab, hold household objects, pain and difficulty to open containers, perform FMS tasks (manipulate fasteners on clothing).     UPPER EXTREMITY ROM:     A/ROM Right eval Rt 08/26/24 Rt 09/15/24  Wrist flexion Approx 60 49 67  Wrist extension Approx 55 69 61  (Blank rows = not tested)  Hand A/ROM Right eval Rt 08/26/24  Full Fist Ability (or Gap to Distal Palmar Crease) Fingers loosely touch palm, able to extend with some minimal lag at middle finger PIP joint Full fist now   Thumb Opposition  (Kapandji Scale)  8/10, WFL 9/10  (Blank rows = not tested)   HAND STRENGTH & FUNCTION: 09/15/24 Grip: 53#   08/26/24 grip strength Rt: 40# no pain    Eval: Observed weakness in affected right hand/arm, grossly 3-/5 MMT, but specific gripping and resistance training contraindicated today.  Per protocols we will begin light grip training in about 2 weeks.   SENSATION: Eval:  Light touch mildly diminished especially through sx area. Expected to improve with HEP and recommendations.   EDEMA:   Eval:  Mildly swollen in surgical hand today.  Expected to improve with HEP and recommendations.   OBSERVATIONS:   Eval: Surgical site is clean and no overt signs of infection, no drainage, signs of dehiscence, etc.  Tenderness and swelling is within normal limits for post-op timeframe.     TODAY'S TREATMENT:  09/15/24: He starts with active range of motion and gripping for exercise as well as new measures which shows improvements at the wrist and the hand.  He still states  some swelling and thick feeling through his right hand MCP region volarly digits 3 through 5.  For that, OT does manual therapy IASTM after he performs contrast bath for nerve desensitization and improvement of swelling.  OT also does manual therapy stretches to the fingers, mainly in finger extension.  OT upgrades his home exercise program to the list below, still having exercises to help manage thumb extensor tendinitis from a previous therapy episode, as well as management of this postsurgical hand stiffness, swelling and pain.  He states pain is reduced and stiffness is reduced at the end of the session-so he was recommended to do these things for himself as best he can twice or 3 times a day.  He was also encouraged that it may just take some additional time to finish healing postop.  He states he would like to be able to return in 2 or 3 weeks as needed for any lingering issues.  OT is in agreement and will upgrade his plan of care.    Exercises - Wrist Prayer Stretch  - 3-4 x daily - 3 reps - 15 seconds hold - Wrist Flexion Stretch  - 4 x daily - 3-5 reps - 15 sec hold - Stretch thumb toward base of small finger (put hand in LAP)  - 2-3 x daily - 3-5 reps - 15 sec hold - Hand PROM Finger Extension  - 2-3 x daily - 5 reps - 15 sec hold - HOOK Stretch  - 2-3 x daily - 3 reps - 15-20 sec hold - Full finger stretches   - 3-4 x daily - 3 reps - 15 second hold - Full Fist  - 2-3 x daily - 3 reps - 10 sec hold - Duck Mouth Strength  - 3 x daily - 3 reps - 10 sec hold - Thumb Press  - 3 x daily - 3 reps - 10 sec hold   PATIENT EDUCATION: Education details: See tx section above for details  Person educated: Patient Education method: Verbal Instruction, Teach back, Handouts  Education comprehension: States and demonstrates understanding, additional education required   HOME EXERCISE PROGRAM: Access Code: [email or web address] URL: [email or web address] Date: 08/14/2024 Prepared by:  Melvenia Ada   GOALS: Goals reviewed with  patient? Yes   Long TERM GOALS: (STG required if POC>30 days) Target Date: 10/17/24  1.  Pt will demo/state understanding of initial HEP and therapist recommendations to improve pain levels, improve motions and ability and eventually return to normal activities.   Goal status: 08/14/24 MET  2.  In 2 weeks, patient will tolerate gripping and pinching with therapy putty and learned a home exercise program to improve his ability and status for IADLs.  GOAL STATUS: 09/15/24: MET  3.  Patient will have 5/5 strength in right wrist and hand, states no significant pain or swelling in order to have full and robust IADLs.  GOAL STATUS:10/17/24: New goal today      ASSESSMENT:  CLINICAL IMPRESSION: 09/15/24: Unfortunately the patient had an exacerbation of symptoms in his right hand postop.  About 3 weeks ago, he drove for hours on end, not feeling pain but eventually having swelling and pain in the right hand surgical area.  This has slowly gotten better-and he has not been able to return to therapy until today, but did discuss the symptoms with the occupational therapist when he was here working with the physical therapist.  Today his grip strength is overall improved and his range of motion is within functional limits, just tender in the surgical site.  He still shows some swelling.  We manage these things well together today-but he would like to have therapy plan of care left open for any future potential needs within the next month.  OT is in agreement, and will extend his plan of care to, as needed in the next month.  Hopefully he feels good with his home exercise program and does not feel the need to return.    PLAN:  OT FREQUENCY: Up to 1 time a week  OT DURATION: up to 6 additional weeks from 09/05/2024 through 10/17/2024 as needed, and up to 6 total visits.  PLANNED INTERVENTIONS: self care/ADL training, therapeutic exercise, therapeutic  activity, neuromuscular re-education, manual therapy, scar mobilization, passive range of motion, splinting, ultrasound, fluidotherapy, compression bandaging, moist heat, cryotherapy, contrast bath, patient/family education, energy conservation, coping strategies training, and Re-evaluation  RECOMMENDED OTHER SERVICES: Is in physical therapy for recent right shoulder pain.  PT is working with OT to ensure no exacerbation of the right hand  CONSULTED AND AGREED WITH PLAN OF CARE: Patient  PLAN FOR NEXT SESSION:   Follow-up as needed in the next 3 or so weeks to help with any lingering stiffness or pain.   Melvenia Ada, OTR/L, CHT 09/15/2024, 12:47 PM

## 2024-09-16 ENCOUNTER — Encounter: Payer: Self-pay | Admitting: Physical Therapy

## 2024-09-16 ENCOUNTER — Ambulatory Visit (INDEPENDENT_AMBULATORY_CARE_PROVIDER_SITE_OTHER): Admitting: Physical Therapy

## 2024-09-16 DIAGNOSIS — R600 Localized edema: Secondary | ICD-10-CM

## 2024-09-16 DIAGNOSIS — M6281 Muscle weakness (generalized): Secondary | ICD-10-CM

## 2024-09-16 DIAGNOSIS — M25641 Stiffness of right hand, not elsewhere classified: Secondary | ICD-10-CM | POA: Diagnosis not present

## 2024-09-16 DIAGNOSIS — R278 Other lack of coordination: Secondary | ICD-10-CM | POA: Diagnosis not present

## 2024-09-16 NOTE — Therapy (Signed)
 OUTPATIENT PHYSICAL THERAPY UPPER EXTREMITY TREATMENT   Patient Name: Jose [name] MRN: [id number] DOB:[birth year], 73 y.o., male Today's Date: 09/16/2024  END OF SESSION:  PT End of Session - 09/16/24 1106     Visit Number 7    Number of Visits 16    Date for Recertification  10/09/24    Authorization Type Medicare A/B & BCBS supp    Progress Note Due on Visit 10    PT Start Time 0930    PT Stop Time 1015    PT Time Calculation (min) 45 min    Activity Tolerance Patient tolerated treatment well;Patient limited by pain                Past Medical History:  Diagnosis Date   Arthritis    R shoulder, great toes, hands- has gout & has injections in knees with Dr. [name]    Bicuspid aortic valve with ascending aorta 4.0 to 4.5 cm in diameter    CAD (coronary artery disease)    Cancer (HCC)    skin ca-    GERD (gastroesophageal reflux disease)    Headache    History of hiatal hernia    Hypertension    Prostate cancer (HCC)    S/P TAVR (transcatheter aortic valve replacement)    SDH (subdural hematoma) (HCC)    Severe aortic stenosis    Thoracic ascending aortic aneurysm    Vertebral artery aneurysm    Past Surgical History:  Procedure Laterality Date   BRAIN SURGERY  2016   evacuation of SDH   CARDIAC CATHETERIZATION     CARPAL TUNNEL RELEASE Bilateral    embolization of left vertebral artery aneurysm with pipeline/coil  10/31/2022   GREEN LIGHT LASER TURP (TRANSURETHRAL RESECTION OF PROSTATE  04/27/2017   KNEE ARTHROPLASTY     LEFT HEART CATH AND CORONARY ANGIOGRAPHY N/A 10/08/2019   Procedure: LEFT HEART CATH AND CORONARY ANGIOGRAPHY;  Surgeon: Wonda Sharper, MD;  Location: [facility]IVE [facility];  Service: Cardiovascular;  Laterality: N/A;   QUADRICEPS TENDON REPAIR Bilateral 06/15/2017   Procedure: REPAIR QUADRICEP TENDON;  Surgeon: Addie Cordella Glendia, MD;  Location: [facility] OR;  Service: Orthopedics;  Laterality: Bilateral;   SHOULDER SURGERY Right    TEE  WITHOUT CARDIOVERSION N/A 07/03/2022   Procedure: TRANSESOPHAGEAL ECHOCARDIOGRAM (TEE);  Surgeon: Delford Maude BROCKS, MD;  Location: [facility] ENDOSCOPY;  Service: Cardiovascular;  Laterality: N/A;   TRANSCATHETER AORTIC VALVE REPLACEMENT, TRANSFEMORAL  12/23/2019   TRANSCATHETER AORTIC VALVE REPLACEMENT, TRANSFEMORAL N/A 12/23/2019   Procedure: TRANSCATHETER AORTIC VALVE REPLACEMENT, TRANSFEMORAL;  Surgeon: Wonda Sharper, MD;  Location: [facility] OR;  Service: Open Heart Surgery;  Laterality: N/A;   VASCULAR SURGERY     Patient Active Problem List   Diagnosis Date Noted   Hypertension    Severe aortic stenosis 10/08/2019   Candidiasis    Abnormality of gait    Hypoalbuminemia due to protein-calorie malnutrition    History of gout    Sleep disturbance    Constipation    Rupture of quadriceps tendon, right, sequela 06/19/2017   Quadriceps tendon rupture, left, sequela 06/19/2017   Acute blood loss anemia 06/19/2017   Fall    History of subdural hematoma    Post-operative pain    Recurrent falls    Quadriceps tendon rupture 06/15/2017   Primary osteoarthritis of both knees 02/28/2017   Primary osteoarthritis of both hands 02/28/2017   Uricacidemia 02/28/2017   Pain in joint of left knee 02/07/2017   Idiopathic chronic gout,  unspecified site, without tophus (tophi) 11/29/2016   HEMATURIA UNSPECIFIED 01/25/2009   Abdominal pain 01/25/2009   XERODERMA 03/10/2008   UNS ADVRS EFF UNS RX MEDICINAL&BIOLOGICAL SBSTNC 03/10/2008   ADJUSTMENT REACTION WITH PHYSICAL SYMPTOMS 02/14/2008   MUSCLE SPASM 02/14/2008   ACUTE PROSTATITIS 12/16/2007   BREAST LUMP OR MASS, RIGHT 07/12/2007   H/O: RCT (rotator cuff tear) 01/13/2007   GOUT 01/08/2007    PCP: Birda Ahumada, MD  REFERRING PROVIDER: [name] Reiter, MD  REFERRING DIAG: [phone] (ICD-10-CM) - Chronic right shoulder painM19.011 (ICD-10-CM) - Primary osteoarthritis, right shoulder  THERAPY DIAG:  Muscle weakness  (generalized)  Localized edema  Other lack of coordination  Stiffness of right hand, not elsewhere classified  Rationale for Evaluation and Treatment: Rehabilitation  ONSET DATE: referral 07/30/2024, after trigger finger release on 8/72025  SUBJECTIVE:                                                                                                                                                                                      SUBJECTIVE STATEMENT: Pt still with limitations in ER of bilateral shoulder. Treatment focusing on scapular stability and strengthening to tolerance.     Hand dominance: Right  PERTINENT HISTORY: polio, RUE trigger finger release 07/24/2024,  aortic valve replacement 2021, bil quad tendorn repair 2018 w/left reconstruction 2020, OA, gout, CAD, skin & prostate CA, right breast lump or mass, HTN, SDH with surgery 2016, Thoracic ascending aortic aneurysm, vertebral artery aneurysm, knee arthroplasty  DIAGNOSTIC FINDINGS: RIGHT SHOULDER - 2+ VIEW  IMPRESSION: 1. Advanced glenohumeral osteoarthritis. 2. Soft tissue calcifications in the region of the rotator cuff insertion, can be seen with calcific tendinopathy.  PAIN:  Are you having pain? Yes: NPRS scale: since last PT up to 3/10 shoulder, 0/10 hand Pain location: right shoulder more joint anteriorly Pain description:  static  Aggravating factors: movements Relieving factors: rest, heat, ice, medication  PRECAUTIONS: None  RED FLAGS: None   WEIGHT BEARING RESTRICTIONS: No  FALLS:  Has patient fallen in last 6 months? No  LIVING ENVIRONMENT: Lives with: lives with partner Lives in: House Stairs: Yes: Internal: 17 steps; on left going up and External: 2 steps; none (office & gym are upstairs;  master bedroom & bath on main level) Has following equipment at home: None  OCCUPATION: part-time consulting  PLOF: Independent  PATIENT GOALS: strengthening  NEXT MD VISIT: tbd  OBJECTIVE:  Note:  Objective measures were completed at Evaluation unless otherwise noted.  Patient-Specific Activity Scoring Scheme  0 represents "unable to perform." 10 represents "able to perform at prior level. 0 1 2 3 4 5 6 7 8 9  10 (Date and Score)  Activity Eval   09/04/24  1. exercising  3  6.5  2. lifting 3  4  3. yardwork 0 0  4.    5.    Score 2 3.5   Total score = sum of the activity scores/number of activities Minimum detectable change (90%CI) for average score = 2 points Minimum detectable change (90%CI) for single activity score = 3 points  COGNITION: Overall cognitive status: Within functional limits for tasks assessed     SENSATION: WFL  POSTURE: round shoulder, forward head   UPPER EXTREMITY ROM:   ROM Right eval  Shoulder flexion Seated P: 142* painful A: 132*  Shoulder extension Standing P: 38* painful A: 36*  Shoulder abduction Seated P: 57* painful A: 43* painful  Shoulder horizontal adduction Supine: A: 12*  Shoulder internal rotation Supine A: 52* painful P: 57* painful  Shoulder external rotation Supine P: 37* painful Sidelying A:  painful  Elbow flexion   Elbow extension   Wrist flexion   Wrist extension   Wrist ulnar deviation   Wrist radial deviation   Wrist pronation   Wrist supination   (Blank rows = not tested)  UPPER EXTREMITY MMT:  MMT Right eval Left eval  Shoulder flexion 3-/5   Shoulder extension    Shoulder abduction 3-/5   Shoulder adduction    Shoulder internal rotation 3-/5   Shoulder external rotation 3-/5   Middle trapezius    Lower trapezius    Elbow flexion    Elbow extension    Wrist flexion    Wrist extension    Wrist ulnar deviation    Wrist radial deviation    Wrist pronation    Wrist supination    Grip strength (lbs)    (Blank rows = not tested)  SHOULDER SPECIAL TESTS: not tested  JOINT MOBILITY TESTING:  Not tested  PALPATION:  Not tested  TODAY'S  PT TREATMENT:                                                                                                       DATE:09/16/24 TherEx Reactive resistive ER walk outs 2 x 10 bil UE, RT using green TB and left using red TB  Neuro Re-Ed Scapular stabilization ball against the wall x 10 holding 10 sec c shoulder protraction  TherAct Reaching across high mat table using foam roller to assist c bil shoulder flexion x 15 repeated with red TB around bil wrist for added ER x 10  Scifit: UE/LE x 8 minutes, level 4 Dead carry: 10# bil UE 250 feet working     09/11/2024 Therapeutic Activities:  AAROM seated pulley flexion 3 minutes and abduction 3 minutes. He ordered pulleys for HEP.  Walkout reactive isometric blue theraband 10 reps followed by resistive of same motion 10 reps.  ER, ext, IR, flex.  Pt verbalized understanding of rationale for stabilization followed by active strengthening to facilitate scapular stabilization with use.     Self-Care: PT demo & verbal cues on using Yoga block in car to position LUE to facilitate ability for LUE  assistance to grip steering wheel for longer distance drives. Pt verbalized understanding. Spurgeon Ada, OT, CHT also spoke to pt regarding driving recommendations.      TREATMENT:                                                                                                       DATE: 09/04/2024 Therapeutic Activities:  AAROM seated pulley flexion 3 minutes and abduction 3 minutes. He ordered pulleys for HEP.  Left side lying RUE ER against gravity PT tactile & verbal cues on scapular retraction first to limit winging  10 reps no weight, 10 reps 2 sets with 1#.  PT cueing shoulder posture.  Left side lying RUE shoulder ext then internal rotation; he is only able to get hand to buttocks. 5 reps 3 sets with PT tactile cues for scapula / GH control Supine with half foam roll along spine for stretch and larger range:  RUE & BUE chest press with end serratus punch with RUE 1# 10 reps 2 sets.  PT cues  on technique for scapula / GH control Supine with half foam roll along spine for stretch and larger range: horizontal adduction with end range pectoralis stretch RUE 1# 10  reps 2 sets.  PT cues on technique  for scapula / GH control Supine with half foam roll along spine for stretch and larger range: wand BUE shoulder flexion 10  reps 2 sets.  PT cues on technique  for scapula / GH control Supine biceps curl 1# 10 reps 1st set palm up, 2nd set palm neutral, 3rd palm down.  PT cues on technique  for scapula / GH control especially at end of eccentric motion. Supine triceps press 1# 10 reps 2 sets. PT cues on technique  for scapula / GH control especially at end of eccentric motion. Standing green theraband rows 10 reps 2 sets. PT cues on technique  for scapula / GH control.   Pt verbalized understanding of HEP.     TREATMENT:                                                                                                       DATE: 09/02/2024 Therapeutic Activities:  UBE BUEs only seat 11 level 3 for 3.5 min ea direction. Cues on posture Cervical retraction standing with pillow on door frame.   Scapular retraction with mirror for visual feedback to posture.  15 reps with tactile cues. Standing green then red theraband reactive ER 5 reps ea level and active ER red 5 reps PT cues for posture and motion including right grip.  PT palpating right shoulder with popcorn motion in anterior area.  Left side lying RUE ER against gravity 10 reps no weight, 10 reps 2 sets with 1#.  PT cueing shoulder posture.  Prone scapular retraction against gravity field goal position and arms at sides 10 reps 3 sets of each.  Supine with half foam roll along spine for stretch and larger range:  RUE & BUE chest press with end serratus punch 10 reps 2 sets.  PT cues on technique Supine with half foam roll along spine for stretch and larger range: horizontal adduction with end range pectoralis stretch 10  reps 2  sets.  PT cues on technique  PT updated HEP including HO, demo & verbal cues with above exercises.  Pt verbalized understanding.      PATIENT EDUCATION: Education details: HEP, POC Person educated: Patient Education method: Programmer, multimedia, Demonstration, Verbal cues, and Handouts Education comprehension: verbalized understanding, returned demonstration, and verbal cues required  HOME EXERCISE PROGRAM: Access Code: [email or web address] URL: [email or web address] Date: 09/02/2024 Prepared by: Grayce Spatz  Exercises - Isometric Shoulder Flexion at Wall  - 1-2 x daily - 7 x weekly - 2 sets - 10 reps - 5 seconds hold - Isometric Shoulder Extension at Wall  - 1-2 x daily - 7 x weekly - 2 sets - 10 reps - 5 seconds hold - Isometric Shoulder Abduction at Wall  - 1-2 x daily - 7 x weekly - 2 sets - 10 reps - 5 seconds hold - Isometric Shoulder Adduction  - 1-2 x daily - 7 x weekly - 2 sets - 10 reps - 5 seconds hold - Standing Isometric Shoulder Internal Rotation at Doorway  - 1-2 x daily - 7 x weekly - 2 sets - 10 reps - 5 seconds hold - Standing Isometric Shoulder External Rotation with Doorway  - 1-2 x daily - 7 x weekly - 2 sets - 10 reps - 5 seconds hold - Standing Row with Anchored Resistance  - 1 x daily - 4-7 x weekly - 2 sets - 15 reps - 5 seconds hold - forward reach  - 1 x daily - 4-7 x weekly - 2 sets - 15 reps - 5 seconds hold - Shoulder External Rotation Walkouts with Anchored Resistance  - 1 x daily - 4-7 x weekly - 2 sets - 10 reps - 5 seconds hold - Shoulder Internal Rotation Reactive Isometrics  - 1 x daily - 4-7 x weekly - 2 sets - 10 reps - 5 seconds hold - walking balance with band behind you  - 1 x daily - 4-7 x weekly - 2 sets - 10 reps - 5 seconds hold - Standing Shoulder Row Reactive Isometric  - 1 x daily - 4-7 x weekly - 2 sets - 10 reps - 5 seconds hold - Seated Shoulder Flexion AAROM with Pulley Behind  - 1 x daily - 4-7 x weekly - 1 sets - 2-5 minuters hold -  Seated Shoulder Abduction AAROM with Pulley Behind  - 1 x daily - 4-7 x weekly - 1 sets - 2-5 minutes hold - Sidelying Shoulder External Rotation  - 1 x daily - 4-7 x weekly - 2-3 sets - 10 reps - 5 seconds hold - Prone W Scapular Retraction  - 1 x daily - 4 x weekly - 2-3 sets - 10 reps - 5 seconds hold - Prone Scapular Slide with Shoulder Extension  - 1 x daily - 4 x weekly - 2-3 sets - 10 reps - 5 seconds hold - Supine Alternating Arm Punches on  Foam Roll  - 1 x daily - 4 x weekly - 2-3 sets - 10 reps - 5 seconds hold - supine chest press with punch upward at end  - 1 x daily - 4 x weekly - 2-3 sets - 10 reps - 5 seconds hold - Supine Shoulder Horizontal Abduction with Dumbbells  - 1 x daily - 4 x weekly - 2-3 sets - 10 reps - 5 seconds hold    ASSESSMENT:  CLINICAL IMPRESSION: Treatment today focusing more on scapular stabilization and shoulder strengthening Recommending continued skilled PT interventions with continued progression toward LTG's set.     OBJECTIVE IMPAIRMENTS: decreased activity tolerance, decreased coordination, decreased endurance, decreased mobility, decreased ROM, decreased strength, impaired flexibility, impaired UE functional use, postural dysfunction, and pain.   ACTIVITY LIMITATIONS: carrying, lifting, bed mobility, bathing, dressing, reach over head, and hygiene/grooming  PARTICIPATION LIMITATIONS: meal prep, cleaning, community activity, and yard work  PERSONAL FACTORS: Past/current experiences, Time since onset of injury/illness/exacerbation, and 3+ comorbidities: see PMH are also affecting patient's functional outcome.   REHAB POTENTIAL: Good  CLINICAL DECISION MAKING: Evolving/moderate complexity  EVALUATION COMPLEXITY: Moderate   GOALS: Goals reviewed with patient? Yes  SHORT TERM GOALS: (target date for Short term goals are 3 weeks: 09/04/2024)  1.Patient will demonstrate independent use of home exercise program to maintain progress from in  clinic treatments. Baseline: SEE OBJECTIVE DATA Goal status:  MET 09/04/2024  LONG TERM GOALS: (target dates for all long term goals are 8 weeks: 10/09/2024)   1. Patient will demonstrate/report at worst less than or equal to 1/10 to facilitate minimal limitation in daily activity secondary to pain symptoms. Baseline: SEE OBJECTIVE DATA Goal status: Ongoing   09/11/2024   2. Patient will demonstrate independent use of home exercise program to facilitate ability to maintain/progress functional gains from skilled physical therapy services. Baseline: SEE OBJECTIVE DATA Goal status: Ongoing   09/11/2024   3.  Patient reports Patient-Specific Activity Score improved to >/= 5 to indicate improvement in functional activities.  Baseline: SEE OBJECTIVE DATA Goal status: Ongoing   09/11/2024   4.  Patient will demonstrate Rt shoulder MMT >/=4/5 throughout to facilitate lifting, reaching, carrying at [facility] in daily activity.  Baseline: SEE OBJECTIVE DATA Goal status: Ongoing   09/11/2024   5.  Patient will demonstrate Rt shoulder AROM WFL s symptoms to facilitate usual overhead reaching, self care, dressing at PLOF.  Baseline: SEE OBJECTIVE DATA Goal status: Ongoing   09/11/2024   PLAN: PT FREQUENCY: 2x/week  PT DURATION: 8 weeks  PLANNED INTERVENTIONS: 97164- PT Re-evaluation, 97750- Physical Performance Testing, 97110-Therapeutic exercises, 97530- Therapeutic activity, [id number]- Neuromuscular re-education, 97535- Self Care, [id number]- Manual therapy, Patient/Family education, and Joint mobilization  PLAN FOR NEXT SESSION:  Progress towards LTGs.  Continue to emphasize scapular control with shoulder motions.      Delon JONELLE Lunger, PT, MPT 09/16/2024, 11:07 AM

## 2024-09-16 NOTE — Progress Notes (Signed)
 History of Present Illness:    73 y.o. with history of GERD, HTN,HLD, spontaneous CNS bleed with craniotomy 2016 when living in [place] . Bicuspid AV with severe AS and dilated aortic root 4.3 cm. Had TAVR with 26 mm Edwards Sapien valve 12/23/19.  Cath prior to procedure showed only obstructive CAD in diagonal  Rx medically Post op TTE with normal EF mean gradient 11 mmHg no PVL. Plavix  now d/c due to history of ICH  TTE 12/22/20 had new mild central AR EF 60-65% mean gradient 14 peak 26 mmHg with DVI 0.30 EF 50-55%  TTE 12/28/21 EF 50-55% mean gradient 24 mmHg peak 38.4 mmHg DVI 0.28 mild PVL  TTE 05/24/22 new moderate to severe central AR mean gradient 28 peak 53 mmhg Ao 4.5 cm  CTA chest not cardiac stable 4.4 cm aneurysm The TAVR valve looks well positioned and not deep and leaflets look ok in single non gated phase with no HALT/HAM  LDL  86 03/07/22  on lipitor 20 mg He did not want to increase dose and NP then changed him to Crestor  40 mg daily   Diastolic pressures have been high Given low normal EF , aneurysm wanted patient to start on ARB he is already on a beta blocker   Has prostate cancer and having radioactive seeds placed in [place]    Reviewed his CTA and TTE with him He has developed higher gradients and central leak since implant CTA really shows relatively good position and not too deep to my eye Although CTA not gated leaflets looked normal with only minor thickening at base. Did have HALT/HAM  TEE done 07/03/22 after his trip to [place] showed moderate to severe central AR normal EF and no PVL  TTE 09/27/22 on eliquis  with much improvement mean gradient down to 15 mmHg and AR only mild EF appears 45-50% He feels more tires ? Due to eliquis  but doubt  TTE 09/27/93 reviewed still with mild/mod AR gradients ok stable on DOAC  10/31/22 Had coil embolization of brain aneurysm in [place].Dr Aida   Known since 2016 When he had his subdural  Seen in [facility] 11/05/22 for right  groin hematoma by CT told to hold eliquis  another 48 hours Hct stable 39.8 As far as I can tell he was told to take ASA/Plavix  and restart eliquis  in 5 days. Started 11/18 and noted more swelling/bruising in leg next day US  negative and leg healed fine   Lots of issues knee/shoulder with gout, arthritis and neuropathy On allopurinol  Had injection left knee   Doing a lot of landscaping in his new house   Past Medical History:  Diagnosis Date   Arthritis    R shoulder, great toes, hands- has gout & has injections in knees with Dr. [name]    Bicuspid aortic valve with ascending aorta 4.0 to 4.5 cm in diameter    CAD (coronary artery disease)    Cancer (HCC)    skin ca-    GERD (gastroesophageal reflux disease)    Headache    History of hiatal hernia    Hypertension    Prostate cancer (HCC)    S/P TAVR (transcatheter aortic valve replacement)    SDH (subdural hematoma) (HCC)    Severe aortic stenosis    Thoracic ascending aortic aneurysm    Vertebral artery aneurysm     Past Surgical History:  Procedure Laterality Date   BRAIN SURGERY  2016   evacuation of SDH   CARDIAC CATHETERIZATION  CARPAL TUNNEL RELEASE Bilateral    embolization of left vertebral artery aneurysm with pipeline/coil  10/31/2022   GREEN LIGHT LASER TURP (TRANSURETHRAL RESECTION OF PROSTATE  04/27/2017   KNEE ARTHROPLASTY     LEFT HEART CATH AND CORONARY ANGIOGRAPHY N/A 10/08/2019   Procedure: LEFT HEART CATH AND CORONARY ANGIOGRAPHY;  Surgeon: Wonda Sharper, MD;  Location: [facility] INVASIVE CV LAB;  Service: Cardiovascular;  Laterality: N/A;   QUADRICEPS TENDON REPAIR Bilateral 06/15/2017   Procedure: REPAIR QUADRICEP TENDON;  Surgeon: Addie Cordella Hamilton, MD;  Location: [facility] OR;  Service: Orthopedics;  Laterality: Bilateral;   SHOULDER SURGERY Right    TEE WITHOUT CARDIOVERSION N/A 07/03/2022   Procedure: TRANSESOPHAGEAL ECHOCARDIOGRAM (TEE);  Surgeon: Delford Maude BROCKS, MD;  Location: [facility] ENDOSCOPY;  Service:  Cardiovascular;  Laterality: N/A;   TRANSCATHETER AORTIC VALVE REPLACEMENT, TRANSFEMORAL  12/23/2019   TRANSCATHETER AORTIC VALVE REPLACEMENT, TRANSFEMORAL N/A 12/23/2019   Procedure: TRANSCATHETER AORTIC VALVE REPLACEMENT, TRANSFEMORAL;  Surgeon: Wonda Sharper, MD;  Location: [facility] OR;  Service: Open Heart Surgery;  Laterality: N/A;   VASCULAR SURGERY      Current Medications: Current Meds  Medication Sig   allopurinol  (ZYLOPRIM ) 300 MG tablet TAKE 1 TABLET BY MOUTH EVERY DAY   amoxicillin  (AMOXIL ) 500 MG capsule amoxicillin  500 mg capsule  TAKE 4 CAPSULES BY MOUTH 1 HOUR BEFORE PROCEDURE   apixaban  (ELIQUIS ) 5 MG TABS tablet Take 1 tablet (5 mg total) by mouth 2 (two) times daily.   aspirin  81 MG chewable tablet Chew 1 tablet (81 mg total) by mouth daily.   atorvastatin  (LIPITOR) 10 MG tablet    clobetasol (TEMOVATE) 0.05 % external solution Apply topically.   esomeprazole  (NEXIUM ) 20 MG capsule Take 20 mg by mouth daily at 12 noon.   famotidine (PEPCID) 40 MG tablet Take 40 mg by mouth 2 (two) times daily.   ketoconazole (NIZORAL) 2 % shampoo Apply 1 Application topically 2 (two) times a week.   losartan  (COZAAR ) 25 MG tablet TAKE 1 TABLET (25 MG TOTAL) BY MOUTH DAILY.   metoprolol  succinate (TOPROL -XL) 50 MG 24 hr tablet TAKE 1 TABLET BY MOUTH DAILY. TAKE WITH OR IMMEDIATELY FOLLOWING A MEAL.   Multiple Vitamins-Minerals (MENS MULTIVITAMIN PO) Take 1 tablet by mouth daily.   rosuvastatin  (CRESTOR ) 40 MG tablet Take 1 tablet (40 mg total) by mouth daily.   tamsulosin (FLOMAX) 0.4 MG CAPS capsule Take 0.4 mg by mouth daily.   traMADol  (ULTRAM ) 50 MG tablet Take 1 tablet (50 mg total) by mouth every 6 (six) hours as needed.     Allergies:   Ciprofloxacin and Quinolones   Social History   Socioeconomic History   Marital status: Divorced    Spouse name: Not on file   Number of children: Not on file   Years of education: Not on file   Highest education level: Not on file   Occupational History   Not on file  Tobacco Use   Smoking status: Never    Passive exposure: Never   Smokeless tobacco: Never  Vaping Use   Vaping status: Never Used  Substance and Sexual Activity   Alcohol  use: Yes    Comment: 1 or 2 glasses of wine weekly   Drug use: No   Sexual activity: Not on file  Other Topics Concern   Not on file  Social History Narrative   Not on file   Social Drivers of Health   Financial Resource Strain: Not on file  Food Insecurity: Not on file  Transportation  Needs: Not on file  Physical Activity: Not on file  Stress: No Stress Concern Present (01/18/2022)   Received from [facility] - Occupational Stress Questionnaire    Feeling of Stress : Not at all  Social Connections: Unknown (04/16/2022)   Received from [facility]   Social Network    Social Network: Not on file     Family History: The patient's family history includes Diabetes in his brother; Heart disease in his father; Parkinson's disease in his brother and mother.  ROS:   Please see the history of present illness.    All other systems reviewed and are negative.  EKGs/Labs/Other Studies Reviewed:    The following studies were reviewed today: TAVR OPERATIVE NOTE     Date of Procedure:                12/23/2019   Preoperative Diagnosis:      Severe Aortic Stenosis    Postoperative Diagnosis:    Same    Procedure:        Transcatheter Aortic Valve Replacement - Percutaneous Transfemoral Approach             Edwards Sapien 3 UltraTHV (size 26 mm, model # 9750TFX, serial # [id number])              Co-Surgeons:                        Dorise LOIS Fellers, MD and Ozell Fell, MD   Anesthesiologist:                  Lynwood Stabs, MD   Echocardiographer:              Maude Emmer, MD   Pre-operative Echo Findings: Severe bicuspid aortic stenosis Normal left ventricular systolic function   Post-operative Echo Findings: No  paravalvular leak Normal left ventricular systolic function   ______________   09/29/24  ECHO  IMPRESSIONS     1. Abnormal septal motion . Left ventricular ejection fraction, by  estimation, is 55 to 60%. The left ventricle has normal function. The left  ventricle has no regional wall motion abnormalities. Left ventricular  diastolic parameters are consistent with  Grade I diastolic dysfunction (impaired relaxation). The average left  ventricular global longitudinal strain is -14.2 %. The global longitudinal  strain is normal.   2. Right ventricular systolic function is normal. The right ventricular  size is normal.   3. Left atrial size was mildly dilated.   4. The mitral valve is abnormal. Trivial mitral valve regurgitation. No  evidence of mitral stenosis.   5. Prior TAVR with 26 mm Sapien valve mean gardient decreased from 20.5  to 17 mmH g and peak decreased from 37.2 mmHg to 33.2 mmHg Mild AR appears  central not PVL. The aortic valve has been repaired/replaced. Aortic valve  regurgitation is mild. No  aortic stenosis is present. Procedure Date: 12/23/2019.   6. Aortic dilatation noted. There is moderate dilatation of the ascending  aorta, measuring 45 mm.   7. The inferior vena cava is normal in size with greater than 50%  respiratory variability, suggesting right atrial pressure of 3 mmHg.  _______________  CT ANGIOGRAPHY CHEST WITH CONTRAST  06/03/21   MPRESSION: Unchanged size and appearance of ascending aorta, estimated 4.4 cm maximum dimension on the current CT. Recommend annual imaging followup by CTA or MRA. This recommendation follows 2010 ACCF/AHA/AATS/ACR/ASA/SCA/SCAI/SIR/STS/SVM Guidelines for  the Diagnosis and Management of Patients with Thoracic Aortic Disease. Circulation. 2010; 121: Z733-z630. Aortic aneurysm NOS (ICD10-I71.9)   Redemonstration of changes of prior TAVR   Aortic Atherosclerosis (ICD10-I70.0).   Signed,   Ami RAMAN. Wagner, DO,  RPVI __________________  Echo 12/28/21 IMPRESSIONS   1. Left ventricular ejection fraction, by estimation, is 50 to 55%. The  left ventricle has low normal function. The left ventricle demonstrates  global hypokinesis. There is mild left ventricular hypertrophy. Left  ventricular diastolic parameters are  consistent with Grade I diastolic dysfunction (impaired relaxation).   2. Right ventricular systolic function is mildly reduced. The right  ventricular size is normal. Tricuspid regurgitation signal is inadequate  for assessing PA pressure.   3. Left atrial size was mildly dilated.   4. The mitral valve is normal in structure. Trivial mitral valve  regurgitation. No evidence of mitral stenosis.   5. Bioprosthetic aortic valve s/p TAVR, 26 mm Edwards Sapien THV. Mean  gradient 24 mmHg which is elevated (22 mmHg on previous study).  Dimensionless index 0.3. Mild perivalvular leakage.   6. Aortic dilatation noted. There is moderate dilatation of the ascending  aorta, measuring 44 mm.   7. IVC not visualized.   Echo 12/22/20 IMPRESSIONS  1. Left ventricular ejection fraction, by estimation, is 60 to 65%. The  left ventricle has normal function. The left ventricle has no regional  wall motion abnormalities. There is mild left ventricular hypertrophy.  Left ventricular diastolic parameters  are consistent with Grade I diastolic dysfunction (impaired relaxation).   2. Right ventricular systolic function is normal. The right ventricular  size is mildly enlarged.   3. The mitral valve is normal in structure. Trivial mitral valve  regurgitation. No evidence of mitral stenosis.   4. Mild TAVR valve regurgitation. The aortic valve has been  repaired/replaced. Aortic valve regurgitation is mild. No aortic stenosis  is present. There is a 26 mm Sapien prosthetic (TAVR) valve present in the  aortic position. Procedure Date: 12/23/19.  Aortic regurgitation PHT measures 833 msec. Aortic valve mean  gradient  measures 14.0 mmHg. Aortic valve Vmax measures 2.56 m/s.   5. Aortic dilatation noted. There is moderate dilatation of the ascending  aorta, measuring 46 mm.   6. The inferior vena cava is normal in size with greater than 50%  respiratory variability, suggesting right atrial pressure of 3 mmHg.   Comparison(s): When compared to prior, there is a mild aortic  regurgitation signal.   TEE 07/03/2022  IMPRESSIONS     1. Left ventricular ejection fraction, by estimation, is 60 to 65%. The  left ventricle has normal function. The left ventricle has no regional  wall motion abnormalities.   2. Right ventricular systolic function is normal. The right ventricular  size is normal.   3. No left atrial/left atrial appendage thrombus was detected.   4. The mitral valve is normal in structure. No evidence of mitral valve  regurgitation. No evidence of mitral stenosis.   5. Post TAVR with 26 mm Sapien 3 turbulence and elevated systolic  gradient Moderate to severe central AR No PVL Valve may be under expanded  with V shape from annulus to root. Patient has some HALT/HAM as well on  CTA and leaflets appear thickened with  some systolic doming and restriction to motion . The aortic valve has been  repaired/replaced. Aortic valve regurgitation is not visualized. No aortic  stenosis is present.   6. The inferior vena cava is normal in size  with greater than 50%  respiratory variability, suggesting right atrial pressure of 3 mmHg.   Conclusion(s)/Recommendation(s): Normal biventricular function without  evidence of hemodynamically significant valvular heart disease.   EKG:     SR rate 64 LAD 01/01/20   Recent Labs: 09/25/2024: ALT 19; BUN 18; Creatinine, Ser 1.28; Hemoglobin 14.5; Platelets 149; Potassium 4.5; Sodium 143  Recent Lipid Panel    Component Value Date/Time   CHOL 154 09/25/2024 0911   TRIG 143 09/25/2024 0911   HDL 64 09/25/2024 0911   CHOLHDL 2.4 09/25/2024 0911    CHOLHDL 3.7 CALC 12/25/2007 1636   VLDL 25 12/25/2007 1636   LDLCALC 66 09/25/2024 0911   LDLDIRECT 164.4 12/25/2007 1636    Physical Exam:    VS:  BP 120/82 (BP Location: Left Arm, Patient Position: Sitting, Cuff Size: Normal)   Pulse 64   Ht 6' (1.829 m)   Wt 197 lb 12.8 oz (89.7 kg)   SpO2 99%   BMI 26.83 kg/m     Wt Readings from Last 3 Encounters:  09/29/24 197 lb 12.8 oz (89.7 kg)  06/25/24 199 lb (90.3 kg)  04/10/24 201 lb 6.4 oz (91.4 kg)     Affect appropriate Healthy:  appears stated age HEENT: normal Neck supple with no adenopathy JVP normal no bruits no thyromegaly Lungs clear with no wheezing and good diaphragmatic motion Heart:  S1/S2 SEM/AR  murmur, no rub, gallop or click PMI normal Abdomen: benighn, BS positve, no tenderness, no AAA no bruit.  No HSM or HJR  no hematoma   Distal pulses intact with no bruits No edema Neuro non-focal Skin warm and dry No muscular weakness    PLAN:    In order of problems listed above:  1.S/p TAVR: 12/23/19 with 26 mm Sapien 3 valve gradients  elevated with worsening central AR ? Deep deployment and under expansion not apparent on TTE/CT  HALT/HAM on CT and TEE 07/03/22 with moderate to severe central AR no PVL  Eliquis  started 07/04/22 echo 09/27/22 and gradients much better TTE 09/28/23 stable more mild/mod AR Echo today only mild AR and decreased gradients again   2. HTN:  On Toprol  ARB added f/u home monitoring normal   3. Aneursym: CT scan 06/23/22 4.4 cm ascending aortic root  History of bicuspid AV F/U gated chest CTA ordered on beta blocker and ARB   4. CAD: Cath prior to TAVR 10/08/19 with 75% small D1 and 50% mid LAD. No chest pain. Continue medical therapy with ASA, statin and BB. CM note indicates D1 not suitable for PCI  Continue beta blocker, baby asa, statin Plavix  d/c with history of ICH  LDL at goal   5. HLD:  now on crestor  repeat labs in 2 months   6. Brain Aneurysm:  Coil embolizaiton in [place]  10/31/22    7. Arthritis: f/u Dr Addie and [name] post injection left knee Trigger fingers in hands and chronic shoulder pain Allopurinol  and Ultram     Gated chest CTA for aneurysm BMET Cr was 1.67 06/25/24   F/U in 6 months   Signed, Maude Emmer, MD  09/29/2024 9:53 AM    [facility]

## 2024-09-18 ENCOUNTER — Encounter: Payer: Self-pay | Admitting: Physical Therapy

## 2024-09-18 ENCOUNTER — Ambulatory Visit: Admitting: Physical Therapy

## 2024-09-18 DIAGNOSIS — M25511 Pain in right shoulder: Secondary | ICD-10-CM | POA: Diagnosis not present

## 2024-09-18 DIAGNOSIS — M25611 Stiffness of right shoulder, not elsewhere classified: Secondary | ICD-10-CM

## 2024-09-18 DIAGNOSIS — R600 Localized edema: Secondary | ICD-10-CM | POA: Diagnosis not present

## 2024-09-18 DIAGNOSIS — M6281 Muscle weakness (generalized): Secondary | ICD-10-CM | POA: Diagnosis not present

## 2024-09-18 DIAGNOSIS — G8929 Other chronic pain: Secondary | ICD-10-CM

## 2024-09-18 NOTE — Therapy (Signed)
 OUTPATIENT PHYSICAL THERAPY UPPER EXTREMITY TREATMENT   Patient Name: Jose [name] MRN: [id number] DOB:17-Oct-1951, 73 y.o., male Today's Date: 09/18/2024  END OF SESSION:  PT End of Session - 09/18/24 0931     Visit Number 8    Number of Visits 16    Date for Recertification  10/09/24    Authorization Type Medicare A/B & BCBS supp    Progress Note Due on Visit 10    PT Start Time 0930    PT Stop Time 1008    PT Time Calculation (min) 38 min    Activity Tolerance Patient tolerated treatment well;Patient limited by pain                 Past Medical History:  Diagnosis Date   Arthritis    R shoulder, great toes, hands- has gout & has injections in knees with Dr. [name]    Bicuspid aortic valve with ascending aorta 4.0 to 4.5 cm in diameter    CAD (coronary artery disease)    Cancer (HCC)    skin ca-    GERD (gastroesophageal reflux disease)    Headache    History of hiatal hernia    Hypertension    Prostate cancer (HCC)    S/P TAVR (transcatheter aortic valve replacement)    SDH (subdural hematoma) (HCC)    Severe aortic stenosis    Thoracic ascending aortic aneurysm    Vertebral artery aneurysm    Past Surgical History:  Procedure Laterality Date   BRAIN SURGERY  2016   evacuation of SDH   CARDIAC CATHETERIZATION     CARPAL TUNNEL RELEASE Bilateral    embolization of left vertebral artery aneurysm with pipeline/coil  10/31/2022   GREEN LIGHT LASER TURP (TRANSURETHRAL RESECTION OF PROSTATE  04/27/2017   KNEE ARTHROPLASTY     LEFT HEART CATH AND CORONARY ANGIOGRAPHY N/A 10/08/2019   Procedure: LEFT HEART CATH AND CORONARY ANGIOGRAPHY;  Surgeon: Wonda Sharper, MD;  Location: [facility] INVASIVE CV LAB;  Service: Cardiovascular;  Laterality: N/A;   QUADRICEPS TENDON REPAIR Bilateral 06/15/2017   Procedure: REPAIR QUADRICEP TENDON;  Surgeon: Addie Cordella Glendia, MD;  Location: [facility] OR;  Service: Orthopedics;  Laterality: Bilateral;   SHOULDER SURGERY Right     TEE WITHOUT CARDIOVERSION N/A 07/03/2022   Procedure: TRANSESOPHAGEAL ECHOCARDIOGRAM (TEE);  Surgeon: Delford Maude BROCKS, MD;  Location: [facility] ENDOSCOPY;  Service: Cardiovascular;  Laterality: N/A;   TRANSCATHETER AORTIC VALVE REPLACEMENT, TRANSFEMORAL  12/23/2019   TRANSCATHETER AORTIC VALVE REPLACEMENT, TRANSFEMORAL N/A 12/23/2019   Procedure: TRANSCATHETER AORTIC VALVE REPLACEMENT, TRANSFEMORAL;  Surgeon: Wonda Sharper, MD;  Location: [facility] OR;  Service: Open Heart Surgery;  Laterality: N/A;   VASCULAR SURGERY     Patient Active Problem List   Diagnosis Date Noted   Hypertension    Severe aortic stenosis 10/08/2019   Candidiasis    Abnormality of gait    Hypoalbuminemia due to protein-calorie malnutrition    History of gout    Sleep disturbance    Constipation    Rupture of quadriceps tendon, right, sequela 06/19/2017   Quadriceps tendon rupture, left, sequela 06/19/2017   Acute blood loss anemia 06/19/2017   Fall    History of subdural hematoma    Post-operative pain    Recurrent falls    Quadriceps tendon rupture 06/15/2017   Primary osteoarthritis of both knees 02/28/2017   Primary osteoarthritis of both hands 02/28/2017   Uricacidemia 02/28/2017   Pain in joint of left knee 02/07/2017   Idiopathic chronic  gout, unspecified site, without tophus (tophi) 11/29/2016   HEMATURIA UNSPECIFIED 01/25/2009   Abdominal pain 01/25/2009   XERODERMA 03/10/2008   UNS ADVRS EFF UNS RX MEDICINAL&BIOLOGICAL SBSTNC 03/10/2008   ADJUSTMENT REACTION WITH PHYSICAL SYMPTOMS 02/14/2008   MUSCLE SPASM 02/14/2008   ACUTE PROSTATITIS 12/16/2007   BREAST LUMP OR MASS, RIGHT 07/12/2007   H/O: RCT (rotator cuff tear) 01/13/2007   GOUT 01/08/2007    PCP: Birda Ahumada, MD  REFERRING PROVIDER: Addie Cordella Hamilton, MD  REFERRING DIAG: [phone] (ICD-10-CM) - Chronic right shoulder painM19.011 (ICD-10-CM) - Primary osteoarthritis, right shoulder  THERAPY DIAG:  Muscle weakness  (generalized)  Localized edema  Stiffness of right shoulder, not elsewhere classified  Chronic right shoulder pain  Rationale for Evaluation and Treatment: Rehabilitation  ONSET DATE: referral 07/30/2024, after trigger finger release on 8/72025  SUBJECTIVE:                                                                                                                                                                                      SUBJECTIVE STATEMENT: He has been able to return to gym routine with no issues with hand or shoulder.   Hand dominance: Right  PERTINENT HISTORY: polio, RUE trigger finger release 07/24/2024,  aortic valve replacement 2021, bil quad tendorn repair 2018 w/left reconstruction 2020, OA, gout, CAD, skin & prostate CA, right breast lump or mass, HTN, SDH with surgery 2016, Thoracic ascending aortic aneurysm, vertebral artery aneurysm, knee arthroplasty  DIAGNOSTIC FINDINGS: RIGHT SHOULDER - 2+ VIEW  IMPRESSION: 1. Advanced glenohumeral osteoarthritis. 2. Soft tissue calcifications in the region of the rotator cuff insertion, can be seen with calcific tendinopathy.  PAIN:  Are you having pain? Yes: NPRS scale: since last PT up to  2/10 shoulder, 0/10 hand Pain location: right shoulder more joint anteriorly Pain description:  static  Aggravating factors: movements Relieving factors: rest, heat, ice, medication  PRECAUTIONS: None  RED FLAGS: None   WEIGHT BEARING RESTRICTIONS: No  FALLS:  Has patient fallen in last 6 months? No  LIVING ENVIRONMENT: Lives with: lives with partner Lives in: House Stairs: Yes: Internal: 17 steps; on left going up and External: 2 steps; none (office & gym are upstairs;  master bedroom & bath on main level) Has following equipment at home: None  OCCUPATION: part-time consulting  PLOF: Independent  PATIENT GOALS: strengthening  NEXT MD VISIT: tbd  OBJECTIVE:  Note: Objective measures were completed at  Evaluation unless otherwise noted.  Patient-Specific Activity Scoring Scheme  0 represents "unable to perform." 10 represents "able to perform at prior level. 0 1 2 3 4 5 6 7 8 9  10 (Date and Score)  Activity Eval   09/04/24  1. exercising  3  6.5  2. lifting 3  4  3. yardwork 0 0  4.    5.    Score 2 3.5   Total score = sum of the activity scores/number of activities Minimum detectable change (90%CI) for average score = 2 points Minimum detectable change (90%CI) for single activity score = 3 points  COGNITION: Overall cognitive status: Within functional limits for tasks assessed     SENSATION: WFL  POSTURE: round shoulder, forward head   UPPER EXTREMITY ROM:   ROM Right eval  Shoulder flexion Seated P: 142* painful A: 132*  Shoulder extension Standing P: 38* painful A: 36*  Shoulder abduction Seated P: 57* painful A: 43* painful  Shoulder horizontal adduction Supine: A: 12*  Shoulder internal rotation Supine A: 52* painful P: 57* painful  Shoulder external rotation Supine P: 37* painful Sidelying A:  painful  Elbow flexion   Elbow extension   Wrist flexion   Wrist extension   Wrist ulnar deviation   Wrist radial deviation   Wrist pronation   Wrist supination   (Blank rows = not tested)  UPPER EXTREMITY MMT:  MMT Right eval Left eval  Shoulder flexion 3-/5   Shoulder extension    Shoulder abduction 3-/5   Shoulder adduction    Shoulder internal rotation 3-/5   Shoulder external rotation 3-/5   Middle trapezius    Lower trapezius    Elbow flexion    Elbow extension    Wrist flexion    Wrist extension    Wrist ulnar deviation    Wrist radial deviation    Wrist pronation    Wrist supination    Grip strength (lbs)    (Blank rows = not tested)  SHOULDER SPECIAL TESTS: not tested  JOINT MOBILITY TESTING:  Not tested  PALPATION:  Not tested  TODAY'S  TREATMENT:                                                                                                        DATE: 09/18/2024 Therapeutic Exercise:  Scifit seat 10: UE/LE, level 3, 4 min forward & 4 min backward. PT cues on head posture Reactive resistance stabilization ER & IR green Theraband RUE & red Theraband LUE 10 reps ea.  Above activity followed by resisted active ER green Theraband RUE & no resistance AROM only LUE 10 reps ea.  Above activity followed by resisted active IR green Theraband RUE & red Theraband LUE 10 reps ea.  Machine row BUE 25# 15 reps, RUE 15# 15 reps, LUE 5# 15 reps, BUE 25# 15 reps PT cued on head posture while performing exercise Machine chest press BUE 15# 15 reps, RUE 5# 15 reps, LUE unable to do even without weight, BUE 15# 15 reps;  PT cued on head posture while performing exercise    PT TREATMENT:  DATE:09/16/24 TherEx Reactive resistive ER walk outs 2 x 10 bil UE, RT using green TB and left using red TB  Neuro Re-Ed Scapular stabilization ball against the wall x 10 holding 10 sec c shoulder protraction  TherAct Reaching across high mat table using foam roller to assist c bil shoulder flexion x 15 repeated with red TB around bil wrist for added ER x 10  Scifit: UE/LE x 8 minutes, level 4 Dead carry: 10# bil UE 250 feet working    TREATMENT:                                                                                                       DATE: 09/11/2024 Therapeutic Activities:  AAROM seated pulley flexion 3 minutes and abduction 3 minutes. He ordered pulleys for HEP.  Walkout reactive isometric blue theraband 10 reps followed by resistive of same motion 10 reps.  ER, ext, IR, flex.  Pt verbalized understanding of rationale for stabilization followed by active strengthening to facilitate scapular stabilization with use.     Self-Care: PT demo & verbal cues on using Yoga block in car to position LUE to facilitate ability for  LUE assistance to grip steering wheel for longer distance drives. Pt verbalized understanding. Spurgeon Ada, OT, CHT also spoke to pt regarding driving recommendations.      TREATMENT:                                                                                                       DATE: 09/04/2024 Therapeutic Activities:  AAROM seated pulley flexion 3 minutes and abduction 3 minutes. He ordered pulleys for HEP.  Left side lying RUE ER against gravity PT tactile & verbal cues on scapular retraction first to limit winging  10 reps no weight, 10 reps 2 sets with 1#.  PT cueing shoulder posture.  Left side lying RUE shoulder ext then internal rotation; he is only able to get hand to buttocks. 5 reps 3 sets with PT tactile cues for scapula / GH control Supine with half foam roll along spine for stretch and larger range:  RUE & BUE chest press with end serratus punch with RUE 1# 10 reps 2 sets.  PT cues on technique for scapula / GH control Supine with half foam roll along spine for stretch and larger range: horizontal adduction with end range pectoralis stretch RUE 1# 10  reps 2 sets.  PT cues on technique  for scapula / GH control Supine with half foam roll along spine for stretch and larger range: wand BUE shoulder flexion 10  reps 2 sets.  PT cues on technique  for scapula / GH  control Supine biceps curl 1# 10 reps 1st set palm up, 2nd set palm neutral, 3rd palm down.  PT cues on technique  for scapula / GH control especially at end of eccentric motion. Supine triceps press 1# 10 reps 2 sets. PT cues on technique  for scapula / GH control especially at end of eccentric motion. Standing green theraband rows 10 reps 2 sets. PT cues on technique  for scapula / GH control.   Pt verbalized understanding of HEP.     PATIENT EDUCATION: Education details: HEP, POC Person educated: Patient Education method: Programmer, multimedia, Demonstration, Verbal cues, and Handouts Education comprehension: verbalized  understanding, returned demonstration, and verbal cues required  HOME EXERCISE PROGRAM: Access Code: [email or web address] URL: [email or web address] Date: 09/02/2024 Prepared by: Grayce Spatz  Exercises - Isometric Shoulder Flexion at Wall  - 1-2 x daily - 7 x weekly - 2 sets - 10 reps - 5 seconds hold - Isometric Shoulder Extension at Wall  - 1-2 x daily - 7 x weekly - 2 sets - 10 reps - 5 seconds hold - Isometric Shoulder Abduction at Wall  - 1-2 x daily - 7 x weekly - 2 sets - 10 reps - 5 seconds hold - Isometric Shoulder Adduction  - 1-2 x daily - 7 x weekly - 2 sets - 10 reps - 5 seconds hold - Standing Isometric Shoulder Internal Rotation at Doorway  - 1-2 x daily - 7 x weekly - 2 sets - 10 reps - 5 seconds hold - Standing Isometric Shoulder External Rotation with Doorway  - 1-2 x daily - 7 x weekly - 2 sets - 10 reps - 5 seconds hold - Standing Row with Anchored Resistance  - 1 x daily - 4-7 x weekly - 2 sets - 15 reps - 5 seconds hold - forward reach  - 1 x daily - 4-7 x weekly - 2 sets - 15 reps - 5 seconds hold - Shoulder External Rotation Walkouts with Anchored Resistance  - 1 x daily - 4-7 x weekly - 2 sets - 10 reps - 5 seconds hold - Shoulder Internal Rotation Reactive Isometrics  - 1 x daily - 4-7 x weekly - 2 sets - 10 reps - 5 seconds hold - walking balance with band behind you  - 1 x daily - 4-7 x weekly - 2 sets - 10 reps - 5 seconds hold - Standing Shoulder Row Reactive Isometric  - 1 x daily - 4-7 x weekly - 2 sets - 10 reps - 5 seconds hold - Seated Shoulder Flexion AAROM with Pulley Behind  - 1 x daily - 4-7 x weekly - 1 sets - 2-5 minuters hold - Seated Shoulder Abduction AAROM with Pulley Behind  - 1 x daily - 4-7 x weekly - 1 sets - 2-5 minutes hold - Sidelying Shoulder External Rotation  - 1 x daily - 4-7 x weekly - 2-3 sets - 10 reps - 5 seconds hold - Prone W Scapular Retraction  - 1 x daily - 4 x weekly - 2-3 sets - 10 reps - 5 seconds hold - Prone Scapular  Slide with Shoulder Extension  - 1 x daily - 4 x weekly - 2-3 sets - 10 reps - 5 seconds hold - Supine Alternating Arm Punches on Foam Roll  - 1 x daily - 4 x weekly - 2-3 sets - 10 reps - 5 seconds hold - supine chest press with punch upward at end  - 1 x daily -  4 x weekly - 2-3 sets - 10 reps - 5 seconds hold - Supine Shoulder Horizontal Abduction with Dumbbells  - 1 x daily - 4 x weekly - 2-3 sets - 10 reps - 5 seconds hold    ASSESSMENT:  CLINICAL IMPRESSION: Patient appears that shoulder function is close to prior to hand surgery level.  He has returned to gym which he needs to maintain function strength and endurance for his shoulders.  He needs some PT cues for form to prevent other issues.    OBJECTIVE IMPAIRMENTS: decreased activity tolerance, decreased coordination, decreased endurance, decreased mobility, decreased ROM, decreased strength, impaired flexibility, impaired UE functional use, postural dysfunction, and pain.   ACTIVITY LIMITATIONS: carrying, lifting, bed mobility, bathing, dressing, reach over head, and hygiene/grooming  PARTICIPATION LIMITATIONS: meal prep, cleaning, community activity, and yard work  PERSONAL FACTORS: Past/current experiences, Time since onset of injury/illness/exacerbation, and 3+ comorbidities: see PMH are also affecting patient's functional outcome.   REHAB POTENTIAL: Good  CLINICAL DECISION MAKING: Evolving/moderate complexity  EVALUATION COMPLEXITY: Moderate   GOALS: Goals reviewed with patient? Yes  SHORT TERM GOALS: (target date for Short term goals are 3 weeks: 09/04/2024)  1.Patient will demonstrate independent use of home exercise program to maintain progress from in clinic treatments. Baseline: SEE OBJECTIVE DATA Goal status:  MET 09/04/2024  LONG TERM GOALS: (target dates for all long term goals are 8 weeks: 10/09/2024)   1. Patient will demonstrate/report at worst less than or equal to 1/10 to facilitate minimal limitation in  daily activity secondary to pain symptoms. Baseline: SEE OBJECTIVE DATA Goal status: Ongoing   09/18/2024   2. Patient will demonstrate independent use of home exercise program to facilitate ability to maintain/progress functional gains from skilled physical therapy services. Baseline: SEE OBJECTIVE DATA Goal status: Ongoing   09/18/2024   3.  Patient reports Patient-Specific Activity Score improved to >/= 5 to indicate improvement in functional activities.  Baseline: SEE OBJECTIVE DATA Goal status: Ongoing   09/18/2024   4.  Patient will demonstrate Rt shoulder MMT >/=4/5 throughout to facilitate lifting, reaching, carrying at [facility] in daily activity.  Baseline: SEE OBJECTIVE DATA Goal status: Ongoing   09/18/2024   5.  Patient will demonstrate Rt shoulder AROM WFL s symptoms to facilitate usual overhead reaching, self care, dressing at PLOF.  Baseline: SEE OBJECTIVE DATA Goal status: Ongoing   09/18/2024   PLAN: PT FREQUENCY: 2x/week  PT DURATION: 8 weeks  PLANNED INTERVENTIONS: [id number]- PT Re-evaluation, 97750- Physical Performance Testing, 97110-Therapeutic exercises, 97530- Therapeutic activity, [id number]- Neuromuscular re-education, 97535- Self Care, [id number]- Manual therapy, Patient/Family education, and Joint mobilization  PLAN FOR NEXT SESSION:  plan to discharge in next 1-2 visits.   Grayce Spatz, PT, DPT 09/18/2024, 11:08 AM

## 2024-09-19 ENCOUNTER — Other Ambulatory Visit: Payer: Self-pay | Admitting: Cardiovascular Disease

## 2024-09-22 NOTE — Therapy (Signed)
 OUTPATIENT OCCUPATIONAL THERAPY TREATMENT NOTE Patient Name: Jose [name] MRN: [id number] DOB:09/01/1951, 73 y.o., male Today's Date: 09/23/2024  REFERRING PROVIDER: Arlinda Buster, MD      END OF SESSION:   Past Medical History:  Diagnosis Date   Arthritis    R shoulder, great toes, hands- has gout & has injections in knees with Dr. [name]    Bicuspid aortic valve with ascending aorta 4.0 to 4.5 cm in diameter    CAD (coronary artery disease)    Cancer (HCC)    skin ca-    GERD (gastroesophageal reflux disease)    Headache    History of hiatal hernia    Hypertension    Prostate cancer (HCC)    S/P TAVR (transcatheter aortic valve replacement)    SDH (subdural hematoma) (HCC)    Severe aortic stenosis    Thoracic ascending aortic aneurysm    Vertebral artery aneurysm    Past Surgical History:  Procedure Laterality Date   BRAIN SURGERY  2016   evacuation of SDH   CARDIAC CATHETERIZATION     CARPAL TUNNEL RELEASE Bilateral    embolization of left vertebral artery aneurysm with pipeline/coil  10/31/2022   GREEN LIGHT LASER TURP (TRANSURETHRAL RESECTION OF PROSTATE  04/27/2017   KNEE ARTHROPLASTY     LEFT HEART CATH AND CORONARY ANGIOGRAPHY N/A 10/08/2019   Procedure: LEFT HEART CATH AND CORONARY ANGIOGRAPHY;  Surgeon: Wonda Sharper, MD;  Location: [facility] INVASIVE CV LAB;  Service: Cardiovascular;  Laterality: N/A;   QUADRICEPS TENDON REPAIR Bilateral 06/15/2017   Procedure: REPAIR QUADRICEP TENDON;  Surgeon: Addie Cordella Glendia, MD;  Location: [facility] OR;  Service: Orthopedics;  Laterality: Bilateral;   SHOULDER SURGERY Right    TEE WITHOUT CARDIOVERSION N/A 07/03/2022   Procedure: TRANSESOPHAGEAL ECHOCARDIOGRAM (TEE);  Surgeon: Delford Maude BROCKS, MD;  Location: [facility] ENDOSCOPY;  Service: Cardiovascular;  Laterality: N/A;   TRANSCATHETER AORTIC VALVE REPLACEMENT, TRANSFEMORAL  12/23/2019   TRANSCATHETER AORTIC VALVE REPLACEMENT, TRANSFEMORAL N/A 12/23/2019   Procedure:  TRANSCATHETER AORTIC VALVE REPLACEMENT, TRANSFEMORAL;  Surgeon: Wonda Sharper, MD;  Location: [facility] OR;  Service: Open Heart Surgery;  Laterality: N/A;   VASCULAR SURGERY     Patient Active Problem List   Diagnosis Date Noted   Hypertension    Severe aortic stenosis 10/08/2019   Candidiasis    Abnormality of gait    Hypoalbuminemia due to protein-calorie malnutrition    History of gout    Sleep disturbance    Constipation    Rupture of quadriceps tendon, right, sequela 06/19/2017   Quadriceps tendon rupture, left, sequela 06/19/2017   Acute blood loss anemia 06/19/2017   Fall    History of subdural hematoma    Post-operative pain    Recurrent falls    Quadriceps tendon rupture 06/15/2017   Primary osteoarthritis of both knees 02/28/2017   Primary osteoarthritis of both hands 02/28/2017   Uricacidemia 02/28/2017   Pain in joint of left knee 02/07/2017   Idiopathic chronic gout, unspecified site, without tophus (tophi) 11/29/2016   HEMATURIA UNSPECIFIED 01/25/2009   Abdominal pain 01/25/2009   XERODERMA 03/10/2008   UNS ADVRS EFF UNS RX MEDICINAL&BIOLOGICAL SBSTNC 03/10/2008   ADJUSTMENT REACTION WITH PHYSICAL SYMPTOMS 02/14/2008   MUSCLE SPASM 02/14/2008   ACUTE PROSTATITIS 12/16/2007   BREAST LUMP OR MASS, RIGHT 07/12/2007   H/O: RCT (rotator cuff tear) 01/13/2007   GOUT 01/08/2007     ONSET DATE:  DOS 07/24/24  REFERRING DIAG:  F34.668 (ICD-10-CM) - Trigger finger, right middle finger  M65.341 (ICD-10-CM) - Trigger finger, right ring finger    THERAPY DIAG:     Muscle weakness (generalized)  Localized edema  Rationale for Evaluation and Treatment: Rehabilitation  PERTINENT HISTORY:  The patient states hx of triggering and pain in their hand and subsequent surgical release. The patient states having some stiffness, pain, decreased ability to make a fist and perform I/ADLs.     PRECAUTIONS: None relative to this evaluation and episode of care.   RED FLAGS:  None   WEIGHT BEARING RESTRICTIONS: Yes: caution with weightbearing for the next 4-6 weeks, recommended less than 5lbs for next 2 weeks with affected hand   SUBJECTIVE:   SUBJECTIVE STATEMENT: The patient is now approx 10 weeks s/p Rt hand RF, MF finger TFRs.  He states having mild aching in RF.  Still feels 1st dorsal compartment with prayer stretches.     PAIN:  Are you having pain?  2/10 at rest ache, and simiar at worst in past week     PATIENT GOALS: To improve motion, function with affected surgical hand  NEXT MD VISIT: PRN    OBJECTIVE MEASURES:   ADLs: Overall ADLs: States decreased ability to grab, hold household objects, pain and difficulty to open containers, perform FMS tasks (manipulate fasteners on clothing).     UPPER EXTREMITY ROM:     A/ROM Right eval Rt 08/26/24 Rt 09/15/24 Rt 09/23/24  Wrist flexion Approx 60 49 67 64  Wrist extension Approx 55 69 61 65  (Blank rows = not tested)                    Hand A/ROM Right eval Rt 08/26/24  Full Fist Ability (or Gap to Distal Palmar Crease) Fingers loosely touch palm, able to extend with some minimal lag at middle finger PIP joint Full fist now   Thumb Opposition  (Kapandji Scale)  8/10, WFL 9/10  (Blank rows = not tested)   HAND STRENGTH & FUNCTION: 09/23/24: Grip Rt: 42#     09/15/24 Grip: 53#   08/26/24 grip strength Rt: 40# no pain    Eval: Observed weakness in affected right hand/arm, grossly 3-/5 MMT, but specific gripping and resistance training contraindicated today.  Per protocols we will begin light grip training in about 2 weeks.   SENSATION: Eval:  Light touch mildly diminished especially through sx area. Expected to improve with HEP and recommendations.   EDEMA:   09/23/24: 21cm bil circumferentially around MCP Js, but P1 of digits 3,4 are both 2-82mm more swollen than Lt hand    Eval:  Mildly swollen in surgical hand today.  Expected to improve with HEP and recommendations.     TODAY'S TREATMENT:  09/23/24: He starts by performing active range of motion, repeated gripping trials, discussing symptoms.  He still has some puffiness and tenderness through the MCPs in the surgical hand, so OT does manual therapy myofascial release techniques as well as cupping along the scar to help soften the scar and trigger some healing.  He tolerates this well with little tenderness or pain.  He states that the scar feels smoother and more pliable afterward.  OT also reviews manual stretches with him, isometric grip training.  We reviewed safety precautions including avoiding heavy and repetitive gripping, but also that he is fairly far out from surgery now and these things should be better tolerated in short order.  Some of the complications and tenderness may come from lingering first dorsal compartment pain which he has  kept in check mostly.  We discussed whether he has a need to return for additional therapy visits and he would like to keep the plan of care open and perhaps return as needed over the next few weeks.  OT is in agreement    PATIENT EDUCATION: Education details: See tx section above for details  Person educated: Patient Education method: Verbal Instruction, Teach back, Handouts  Education comprehension: States and demonstrates understanding   HOME EXERCISE PROGRAM: Access Code: [email or web address] URL: [email or web address] Date: 08/14/2024 Prepared by: Melvenia Ada   GOALS: Goals reviewed with patient? Yes   Long TERM GOALS: (STG required if POC>30 days) Target Date: 10/17/24  1.  Pt will demo/state understanding of initial HEP and therapist recommendations to improve pain levels, improve motions and ability and eventually return to normal activities.   Goal status: 08/14/24 MET  2.  In 2 weeks, patient will tolerate gripping and pinching with therapy putty and learned a home exercise program to improve his ability and status for IADLs.  GOAL  STATUS: 09/15/24: MET  3.  Patient will have 5/5 strength in right wrist and hand, states no significant pain or swelling in order to have full and robust IADLs.  GOAL STATUS:10/17/24: New goal today      ASSESSMENT:  CLINICAL IMPRESSION: 09/23/24: He still having small amount of swelling and tenderness, though relatively insignificant at the surgery site.  He is also still dealing with lingering tenosynovitis in the first dorsal compartment which is likely confounding the symptoms somewhat.  OT explains that it could just be a matter of time for healing, but he should be consistent with stretching and light isometric grip training.  He can return as needed over the next 3 weeks.    PLAN:  OT FREQUENCY: Up to 1 time a week  OT DURATION: up to 6 additional weeks from 09/05/2024 through 10/17/2024 as needed, and up to 6 total visits.  PLANNED INTERVENTIONS: self care/ADL training, therapeutic exercise, therapeutic activity, neuromuscular re-education, manual therapy, scar mobilization, passive range of motion, splinting, ultrasound, fluidotherapy, compression bandaging, moist heat, cryotherapy, contrast bath, patient/family education, energy conservation, coping strategies training, and Re-evaluation  RECOMMENDED OTHER SERVICES: Is in physical therapy for recent right shoulder pain.  PT is working with OT to ensure no exacerbation of the right hand  CONSULTED AND AGREED WITH PLAN OF CARE: Patient  PLAN FOR NEXT SESSION:    Follow-up as needed in the next 3 or so weeks to help with any lingering stiffness or pain.  Discharge if he does not return by the 31st   Melvenia Ada, OTR/L, CHT 09/23/2024, 5:57 PM

## 2024-09-23 ENCOUNTER — Ambulatory Visit: Admitting: Rehabilitative and Restorative Service Providers"

## 2024-09-23 ENCOUNTER — Encounter: Payer: Self-pay | Admitting: Rehabilitative and Restorative Service Providers"

## 2024-09-23 ENCOUNTER — Encounter: Payer: Self-pay | Admitting: Cardiovascular Disease

## 2024-09-23 DIAGNOSIS — R600 Localized edema: Secondary | ICD-10-CM

## 2024-09-23 DIAGNOSIS — I2510 Atherosclerotic heart disease of native coronary artery without angina pectoris: Secondary | ICD-10-CM

## 2024-09-23 DIAGNOSIS — E782 Mixed hyperlipidemia: Secondary | ICD-10-CM

## 2024-09-23 DIAGNOSIS — Z952 Presence of prosthetic heart valve: Secondary | ICD-10-CM

## 2024-09-23 DIAGNOSIS — M6281 Muscle weakness (generalized): Secondary | ICD-10-CM

## 2024-09-25 ENCOUNTER — Other Ambulatory Visit: Payer: Self-pay | Admitting: Physician Assistant

## 2024-09-25 ENCOUNTER — Other Ambulatory Visit: Payer: Self-pay | Admitting: Cardiovascular Disease

## 2024-09-25 LAB — CBC WITH DIFFERENTIAL/PLATELET

## 2024-09-25 NOTE — Telephone Encounter (Signed)
 Last Fill: 06/25/2024  Labs: 06/25/2024 Uric acid WNL-3.2.  continue current dose of allopurinol .  Platelet count is low-129K. Rest of CBC WNL Creatinine is elevated-1.67 and GFR is low-43.   Next Visit: 01/01/2025  Last Visit: 06/25/2024  DX: Idiopathic chronic gout of multiple sites without tophus   Current Dose per office note 06/25/2024: Allopurinol  300 mg 1 tablet daily.   Okay to refill Allopurinol ?

## 2024-09-26 ENCOUNTER — Ambulatory Visit: Payer: Self-pay | Admitting: Cardiovascular Disease

## 2024-09-26 LAB — CBC WITH DIFFERENTIAL/PLATELET
Basos: 1 %
EOS (ABSOLUTE): 0.1 x10E3/uL (ref 0.0–0.2)
Eos: 3 %
Hematocrit: 44.8 % (ref 37.5–51.0)
Hemoglobin: 14.5 g/dL (ref 13.0–17.7)
Immature Granulocytes: 0 %
Immature Granulocytes: 0 x10E3/uL (ref 0.0–0.1)
Lymphs: 19 %
MCH: 30.9 pg (ref 26.6–33.0)
MCHC: 32.4 g/dL (ref 31.5–35.7)
MCV: 95 fL (ref 79–97)
Monocytes Absolute: 0.2 x10E3/uL (ref 0.0–0.4)
Monocytes Absolute: 0.8 x10E3/uL (ref 0.1–0.9)
Monocytes: 10 %
Neutrophils Absolute: 1.6 x10E3/uL (ref 0.7–3.1)
Neutrophils Absolute: 5.5 x10E3/uL (ref 1.4–7.0)
Neutrophils: 67 %
Platelets: 149 x10E3/uL — AB (ref 150–450)
RBC: 4.7 x10E6/uL (ref 4.14–5.80)
RDW: 13.7 % (ref 11.6–15.4)
WBC: 8.1 x10E3/uL (ref 3.4–10.8)

## 2024-09-26 LAB — BASIC METABOLIC PANEL WITH GFR
BUN/Creatinine Ratio: 14 (ref 10–24)
BUN: 18 mg/dL (ref 8–27)
CO2: 21 mmol/L (ref 20–29)
Calcium: 9.6 mg/dL (ref 8.6–10.2)
Chloride: 107 mmol/L — ABNORMAL HIGH (ref 96–106)
Creatinine, Ser: 1.28 mg/dL — ABNORMAL HIGH (ref 0.76–1.27)
Glucose: 99 mg/dL (ref 70–99)
Potassium: 4.5 mmol/L (ref 3.5–5.2)
Sodium: 143 mmol/L (ref 134–144)
eGFR: 58 mL/min/1.73 — ABNORMAL LOW (ref >59)

## 2024-09-26 LAB — LIPID PANEL
Chol/HDL Ratio: 2.4 ratio (ref 0.0–5.0)
Cholesterol, Total: 154 mg/dL (ref 100–199)
HDL: 64 mg/dL (ref >39)
LDL Chol Calc (NIH): 66 mg/dL (ref 0–99)
Triglycerides: 143 mg/dL (ref 0–149)
VLDL Cholesterol Cal: 24 mg/dL (ref 5–40)

## 2024-09-26 LAB — HEPATIC FUNCTION PANEL
ALT: 19 IU/L (ref 0–44)
AST: 18 IU/L (ref 0–40)
Albumin: 4.2 g/dL (ref 3.8–4.8)
Alkaline Phosphatase: 76 IU/L (ref 47–123)
Bilirubin Total: 0.3 mg/dL (ref 0.0–1.2)
Bilirubin, Direct: 0.14 mg/dL (ref 0.00–0.40)
Total Protein: 6.4 g/dL (ref 6.0–8.5)

## 2024-09-29 ENCOUNTER — Ambulatory Visit (INDEPENDENT_AMBULATORY_CARE_PROVIDER_SITE_OTHER): Admitting: Cardiovascular Disease

## 2024-09-29 ENCOUNTER — Ambulatory Visit (HOSPITAL_COMMUNITY)
Admission: RE | Admit: 2024-09-29 | Discharge: 2024-09-29 | Disposition: A | Discharge status: Home / Self Care | Source: Ambulatory Visit | Attending: Cardiovascular Disease | Admitting: Cardiovascular Disease

## 2024-09-29 ENCOUNTER — Encounter: Payer: Self-pay | Admitting: Cardiovascular Disease

## 2024-09-29 VITALS — BP 120/82 | HR 64 | Ht 72.0 in | Wt 197.8 lb

## 2024-09-29 DIAGNOSIS — I350 Nonrheumatic aortic (valve) stenosis: Secondary | ICD-10-CM | POA: Insufficient documentation

## 2024-09-29 DIAGNOSIS — Z952 Presence of prosthetic heart valve: Secondary | ICD-10-CM | POA: Diagnosis present

## 2024-09-29 DIAGNOSIS — I340 Nonrheumatic mitral (valve) insufficiency: Secondary | ICD-10-CM | POA: Insufficient documentation

## 2024-09-29 DIAGNOSIS — I10 Essential (primary) hypertension: Secondary | ICD-10-CM | POA: Diagnosis not present

## 2024-09-29 DIAGNOSIS — I719 Aortic aneurysm of unspecified site, without rupture: Secondary | ICD-10-CM

## 2024-09-29 LAB — ECHOCARDIOGRAM COMPLETE
AR max vel: 1.63 cm2
AV Area VTI: 1.37 cm2
AV Area mean vel: 1.58 cm2
AV Mean grad: 17 mmHg
AV Peak grad: 33.2 mmHg
Ao pk vel: 2.88 m/s
Area-P 1/2: 1.91 cm2
P 1/2 time: 764 ms
S' Lateral: 3.2 cm

## 2024-09-29 NOTE — Patient Instructions (Addendum)
 Medication Instructions:  Your physician recommends that you continue on your current medications as directed. Please refer to the Current Medication list given to you today.  *If you need a refill on your cardiac medications before your next appointment, please call your pharmacy*  Lab Work: none If you have labs (blood work) drawn today and your tests are completely normal, you will receive your results only by: MyChart Message (if you have MyChart) OR A paper copy in the mail If you have any lab test that is abnormal or we need to change your treatment, we will call you to review the results.  Testing/Procedures: Non-Cardiac CT Angiography (CTA), is a special type of CT scan that uses a computer to produce multi-dimensional views of major blood vessels throughout the body. In CT angiography, a contrast material is injected through an IV to help visualize the blood vessels   Follow-Up: At [facility], you and your health needs are our priority.  As part of our continuing mission to provide you with exceptional heart care, our providers are all part of one team.  This team includes your primary Cardiologist (physician) and Advanced Practice Providers or APPs (Physician Assistants and Nurse Practitioners) who all work together to provide you with the care you need, when you need it.  Your next appointment:   6 month(s)  Provider:   Maude Emmer, MD    We recommend signing up for the patient portal called MyChart.  Sign up information is provided on this After Visit Summary.  MyChart is used to connect with patients for Virtual Visits (Telemedicine).  Patients are able to view lab/test results, encounter notes, upcoming appointments, etc.  Non-urgent messages can be sent to your provider as well.   To learn more about what you can do with MyChart, go to [email or web address].   Other Instructions none

## 2024-09-30 ENCOUNTER — Encounter: Payer: Self-pay | Admitting: Cardiovascular Disease

## 2024-10-06 ENCOUNTER — Encounter: Payer: Self-pay | Admitting: Orthopedic Surgery

## 2024-10-06 ENCOUNTER — Ambulatory Visit: Admitting: Orthopedic Surgery

## 2024-10-06 DIAGNOSIS — Z9889 Other specified postprocedural states: Secondary | ICD-10-CM

## 2024-10-06 NOTE — Progress Notes (Signed)
   Jose [name] - 73 y.o. male MRN [id number]  Date of birth: Oct 28, 1951  Office Visit Note: Visit Date: 10/06/2024 PCP: Birda Ahumada, MD Referred by: Birda Ahumada, MD  Subjective:  HPI: Jose [name] is a 73 y.o. male who presents today for follow up 10 weeks status post right long and ring finger trigger digit release.  Having some ongoing soreness and swelling.  He has resumed activities as tolerated.  Pertinent ROS were reviewed with the patient and found to be negative unless otherwise specified above in HPI.   Assessment & Plan: Visit Diagnoses:  1. S/P trigger finger release     Plan: He is progressing status post the right long and ring finger trigger digit releases.  Tendons are demonstrating appropriate gliding on examination today.  He does have some underlying scarring in this region, he also does have some Dupuytren's nodules within the palmar aspect of the hand.  He can resume with occupational therapy for tendon gliding exercises once again and continue with scar massage.  Follow-up myself as needed moving forward.  All questions were answered to his satisfaction today.  Follow-up: No follow-ups on file.   Meds & Orders: No orders of the defined types were placed in this encounter.  No orders of the defined types were placed in this encounter.    Procedures: No procedures performed       Objective:   Vital Signs: There were no vitals taken for this visit.  Ortho Exam Right hand: - Well-healing incision at the base of the long and ring finger, skin is well-approximated, no erythema or drainage - Able to perform full digital range of motion - Sensation intact distally, hand is warm well-perfused   Imaging: No results found.   Jose [name], M.D. [facility], Hand Surgery

## 2024-10-09 ENCOUNTER — Telehealth: Payer: Self-pay | Admitting: Rheumatology

## 2024-10-09 ENCOUNTER — Other Ambulatory Visit: Payer: Self-pay | Admitting: *Deleted

## 2024-10-09 DIAGNOSIS — I719 Aortic aneurysm of unspecified site, without rupture: Secondary | ICD-10-CM

## 2024-10-09 DIAGNOSIS — I350 Nonrheumatic aortic (valve) stenosis: Secondary | ICD-10-CM

## 2024-10-09 NOTE — Telephone Encounter (Signed)
Okay to schedule an appointment

## 2024-10-09 NOTE — Telephone Encounter (Signed)
 Attempted to contact the patient and left a message to call the office back.

## 2024-10-09 NOTE — Telephone Encounter (Signed)
 I cannot do an injection after the trigger finger release.  He should follow-up with the hand surgeon.

## 2024-10-09 NOTE — Telephone Encounter (Signed)
 Patient advised Dr. [name] cannot do an injection after the trigger finger release. He should follow-up with the hand surgeon. Patient states he is not very fond of his [employer]. Patient states his hand surgeon just told him he needs more time and physical therapy. Patient states he would just like someone to look at his hand and tell him where to go or what to do. Please advise.

## 2024-10-09 NOTE — Telephone Encounter (Signed)
 Patient called stating he is experiencing a lot of pain in his right hand middle and ring fingers.  Patient states he had trigger finger release surgery in August, but is still experiencing a lot of pain.  Patient requested an appointment with Dr. [name] to evaluate his fingers and possibly have an injection.  Please advise.

## 2024-10-14 NOTE — Therapy (Signed)
 OUTPATIENT OCCUPATIONAL THERAPY TREATMENT & PROGRESS NOTE Patient Name: Jose [name] MRN: [id number] DOB:[birth year], 73 y.o., male Today's Date: 10/15/2024  REFERRING PROVIDER: Arlinda Buster, MD                Progress Note Reporting Period 09/15/24 to 10/15/24 CLINICAL IMPRESSION: 10/15/24: Due to unforeseen circumstances, the patient has developed triggering and pain in his right hand again.  We have been trying to manage this conservatively, and OT has been adjusting the plan of care.  He has been recommended to allow for healing time, braces fingers, gently stretch, avoid heavy activities.  Due to this exacerbation, we are requesting additional therapy for 4 weeks to help the patient meet his goals and get relief.    PLAN:  OT FREQUENCY: Up to 1 time a week  OT DURATION: up to 4 additional weeks from 10/15/2024 through 11/14/2024 as needed, and up to 6 total visits.  See note below for Objective Data and Assessment of Progress/Goals.                  END OF SESSION:   Past Medical History:  Diagnosis Date   Arthritis    R shoulder, great toes, hands- has gout & has injections in knees with Dr. [name]    Bicuspid aortic valve with ascending aorta 4.0 to 4.5 cm in diameter    CAD (coronary artery disease)    Cancer (HCC)    skin ca-    GERD (gastroesophageal reflux disease)    Headache    History of hiatal hernia    Hypertension    Prostate cancer (HCC)    S/P TAVR (transcatheter aortic valve replacement)    SDH (subdural hematoma) (HCC)    Severe aortic stenosis    Thoracic ascending aortic aneurysm    Vertebral artery aneurysm    Past Surgical History:  Procedure Laterality Date   BRAIN SURGERY  2016   evacuation of SDH   CARDIAC CATHETERIZATION     CARPAL TUNNEL RELEASE Bilateral    embolization of left vertebral artery aneurysm with pipeline/coil  10/31/2022   GREEN LIGHT LASER TURP (TRANSURETHRAL RESECTION OF PROSTATE   04/27/2017   KNEE ARTHROPLASTY     LEFT HEART CATH AND CORONARY ANGIOGRAPHY N/A 10/08/2019   Procedure: LEFT HEART CATH AND CORONARY ANGIOGRAPHY;  Surgeon: Wonda Sharper, MD;  Location: [facility] INVASIVE CV LAB;  Service: Cardiovascular;  Laterality: N/A;   QUADRICEPS TENDON REPAIR Bilateral 06/15/2017   Procedure: REPAIR QUADRICEP TENDON;  Surgeon: Addie Cordella Glendia, MD;  Location: [facility] OR;  Service: Orthopedics;  Laterality: Bilateral;   SHOULDER SURGERY Right    TEE WITHOUT CARDIOVERSION N/A 07/03/2022   Procedure: TRANSESOPHAGEAL ECHOCARDIOGRAM (TEE);  Surgeon: Delford Maude BROCKS, MD;  Location: [facility] ENDOSCOPY;  Service: Cardiovascular;  Laterality: N/A;   TRANSCATHETER AORTIC VALVE REPLACEMENT, TRANSFEMORAL  12/23/2019   TRANSCATHETER AORTIC VALVE REPLACEMENT, TRANSFEMORAL N/A 12/23/2019   Procedure: TRANSCATHETER AORTIC VALVE REPLACEMENT, TRANSFEMORAL;  Surgeon: Wonda Sharper, MD;  Location: [facility] OR;  Service: Open Heart Surgery;  Laterality: N/A;   VASCULAR SURGERY     Patient Active Problem List   Diagnosis Date Noted   Hypertension    Severe aortic stenosis 10/08/2019   Candidiasis    Abnormality of gait    Hypoalbuminemia due to protein-calorie malnutrition    History of gout    Sleep disturbance    Constipation    Rupture of quadriceps tendon, right, sequela 06/19/2017   Quadriceps tendon rupture, left, sequela 06/19/2017  Acute blood loss anemia 06/19/2017   Fall    History of subdural hematoma    Post-operative pain    Recurrent falls    Quadriceps tendon rupture 06/15/2017   Primary osteoarthritis of both knees 02/28/2017   Primary osteoarthritis of both hands 02/28/2017   Uricacidemia 02/28/2017   Pain in joint of left knee 02/07/2017   Idiopathic chronic gout, unspecified site, without tophus (tophi) 11/29/2016   HEMATURIA UNSPECIFIED 01/25/2009   Abdominal pain 01/25/2009   XERODERMA 03/10/2008   UNS ADVRS EFF UNS RX MEDICINAL&BIOLOGICAL SBSTNC 03/10/2008    ADJUSTMENT REACTION WITH PHYSICAL SYMPTOMS 02/14/2008   MUSCLE SPASM 02/14/2008   ACUTE PROSTATITIS 12/16/2007   BREAST LUMP OR MASS, RIGHT 07/12/2007   H/O: RCT (rotator cuff tear) 01/13/2007   GOUT 01/08/2007     ONSET DATE:  DOS 07/24/24  REFERRING DIAG:  F34.668 (ICD-10-CM) - Trigger finger, right middle finger  M65.341 (ICD-10-CM) - Trigger finger, right ring finger    THERAPY DIAG:     Muscle weakness (generalized)  Localized edema  Pain in right hand  Rationale for Evaluation and Treatment: Rehabilitation  PERTINENT HISTORY:  The patient states hx of triggering and pain in their hand and subsequent surgical release. The patient states having some stiffness, pain, decreased ability to make a fist and perform I/ADLs.     PRECAUTIONS: None relative to this evaluation and episode of care.   RED FLAGS: None   WEIGHT BEARING RESTRICTIONS: Yes: caution with weightbearing for the next 4-6 weeks, recommended less than 5lbs for next 2 weeks with affected hand   SUBJECTIVE:   SUBJECTIVE STATEMENT: The patient is now approx 11 weeks s/p Rt hand RF, MF finger TFRs.  He states his hand is less swollen today and the triggering sensation that he feels is not so apparent, but it is still there in the middle and ring fingers of the right hand.  He does state trying to be cautious and not cause pain.  He also states some mild aching near the [facility] joint of the thumb volarly    PAIN:  Are you having pain?  2/10 at rest ache, and simiar at worst in past week, pain does go up to 4-5/10 at worst in the week with heavy activities.   PATIENT GOALS: To improve motion, function with affected surgical hand  NEXT MD VISIT: PRN    OBJECTIVE MEASURES:   ADLs: Overall ADLs: States decreased ability to grab, hold household objects, pain and difficulty to open containers, perform FMS tasks (manipulate fasteners on clothing).     UPPER EXTREMITY ROM:     A/ROM Right eval Rt 08/26/24  Rt 09/15/24 Rt 09/23/24  Wrist flexion Approx 60 49 67 64  Wrist extension Approx 55 69 61 65  (Blank rows = not tested)                    Hand A/ROM Right eval Rt 08/26/24  Full Fist Ability (or Gap to Distal Palmar Crease) Fingers loosely touch palm, able to extend with some minimal lag at middle finger PIP joint Full fist now   Thumb Opposition  (Kapandji Scale)  8/10, WFL 9/10  (Blank rows = not tested)   HAND STRENGTH & FUNCTION: 09/23/24: Grip Rt: 42#    09/15/24 Grip: 53#   08/26/24 grip strength Rt: 40# no pain    Eval: Observed weakness in affected right hand/arm, grossly 3-/5 MMT, but specific gripping and resistance training contraindicated today.  Per protocols  we will begin light grip training in about 2 weeks.   SENSATION: Eval:  Light touch mildly diminished especially through sx area. Expected to improve with HEP and recommendations.   EDEMA:   09/23/24: 21cm bil circumferentially around MCP Js, but P1 of digits 3,4 are both 2-26mm more swollen than Lt hand    Eval:  Mildly swollen in surgical hand today.  Expected to improve with HEP and recommendations.    Observations:  10/15/24: Jaiyon has palpable triggering in the right hand volar area near the MCP joints of the middle and ring fingers.  It is light, but it is present.  It is tender to him.  OT is unsure why he has triggering again after trigger finger release-though a theory is that he build up new scar tissue when he hurt his hand approximately 4 weeks postop after taking a long trip in the car.  Another potential theory could be that surgery was not 100% successful for him.  The surgeon also states that he has the gene for Dupuytren's contracture which may be causing extra tightness and cording through his palm leading to potential triggering.  OT is working to compensate and modify due to these factors to help give the patient some relief.  TODAY'S TREATMENT:  10/15/24:   Due to ongoing pain, tenderness,  triggering-OT educates the patient to treat his right hand as if he is having active trigger fingers in the middle and ring fingers.  Again he was recommended to use Band-Aids or double Band-Aids over the PIP joints to prevent a full tight fist and to prevent triggering.  He should limit rubbing and triggering within his palm by limiting heavy gripping and full range of motion into a fist.  He states understanding.  Next, he was highly recommended to continue doing scar mobilizations aggressively but in a nonpainful fashion.  OT does manual therapy scar mobilization cupping with him today which he tolerates fairly well, and after which triggering is somewhat improved in the ring finger but still slightly present in the middle finger.  We also quickly review his home exercise program which includes intrinsic minus or hook stretches which are probably the most important stretch for him other than prayer stretches.  He also states using therapy putty as a bolster to gently get a passive stretch through the fingers which OT agrees is a good strategy.    We briefly discussed his functional ability and he states that he would like to continue occupational therapy treatment due to these continued issues.  He states having a bit better functional ability, but still being limited with strong grip and full functional activities.  Due to that, we will continue occupational therapy at least for another 3 to 4 weeks to help symptoms resolve, if possible.    PATIENT EDUCATION: Education details: See tx section above for details  Person educated: Patient Education method: Verbal Instruction, Teach back, Handouts  Education comprehension: States and demonstrates understanding   HOME EXERCISE PROGRAM: Access Code: [email or web address] URL: [email or web address] Date: 08/14/2024 Prepared by: Melvenia Ada   GOALS: Goals reviewed with patient? Yes   Long TERM GOALS: (STG required if POC>30 days) Target  Date: 11/14/2024  1.  Pt will demo/state understanding of initial HEP and therapist recommendations to improve pain levels, improve motions and ability and eventually return to normal activities.   Goal status: 08/14/24 MET  2.  In 2 weeks, patient will tolerate gripping and pinching with therapy putty and learned a home exercise  program to improve his ability and status for IADLs.  GOAL STATUS: 09/15/24: MET  3.  Patient will have 5/5 strength in right wrist and hand, states no significant pain or swelling in order to have full and robust IADLs.  GOAL STATUS:10/15/24: improving, but not met... Still tender and painful in the volar palm and still slightly tender near the base of the thumb.  4.  Patient will have pain at worst in the past week less than 2/10, for better functional performance and quality of life  Goal status: New goal today (10/15/2024)        ASSESSMENT:  CLINICAL IMPRESSION: 10/15/24: Due to unforeseen circumstances, the patient has developed triggering and pain in his right hand again.  We have been trying to manage this conservatively, and OT has been adjusting the plan of care.  He has been recommended to allow for healing time, braces fingers, gently stretch, avoid heavy activities.  Due to this exacerbation, we are requesting additional therapy for 4 weeks to help the patient meet his goals and get relief.    PLAN:  OT FREQUENCY: Up to 1 time a week  OT DURATION: up to 4 additional weeks from 10/15/2024 through 11/14/2024 as needed, and up to 6 total visits.  PLANNED INTERVENTIONS: self care/ADL training, therapeutic exercise, therapeutic activity, neuromuscular re-education, manual therapy, scar mobilization, passive range of motion, splinting, ultrasound, fluidotherapy, compression bandaging, moist heat, cryotherapy, contrast bath, patient/family education, energy conservation, coping strategies training, and Re-evaluation  RECOMMENDED OTHER SERVICES: Is in  physical therapy for recent right shoulder pain.  PT is working with OT to ensure no exacerbation of the right hand  CONSULTED AND AGREED WITH PLAN OF CARE: Patient  PLAN FOR NEXT SESSION:   Continue to monitor triggering, work on scar mobility, help find bracing options to find relief.  Try to compensate for functional tasks    Melvenia Ada, OTR/L, CHT 10/15/2024, 12:43 PM

## 2024-10-15 ENCOUNTER — Encounter: Payer: Self-pay | Admitting: Rehabilitative and Restorative Service Providers"

## 2024-10-15 ENCOUNTER — Ambulatory Visit (INDEPENDENT_AMBULATORY_CARE_PROVIDER_SITE_OTHER): Payer: Self-pay | Admitting: Rehabilitative and Restorative Service Providers"

## 2024-10-15 DIAGNOSIS — M6281 Muscle weakness (generalized): Secondary | ICD-10-CM

## 2024-10-15 DIAGNOSIS — M79641 Pain in right hand: Secondary | ICD-10-CM | POA: Diagnosis not present

## 2024-10-15 DIAGNOSIS — R600 Localized edema: Secondary | ICD-10-CM

## 2024-10-17 ENCOUNTER — Ambulatory Visit (HOSPITAL_COMMUNITY)
Admission: RE | Admit: 2024-10-17 | Discharge: 2024-10-17 | Disposition: A | Discharge status: Home / Self Care | Source: Ambulatory Visit | Attending: Cardiovascular Disease | Admitting: Cardiovascular Disease

## 2024-10-17 ENCOUNTER — Ambulatory Visit (HOSPITAL_COMMUNITY)

## 2024-10-17 DIAGNOSIS — I350 Nonrheumatic aortic (valve) stenosis: Secondary | ICD-10-CM | POA: Diagnosis present

## 2024-10-17 DIAGNOSIS — I719 Aortic aneurysm of unspecified site, without rupture: Secondary | ICD-10-CM | POA: Diagnosis present

## 2024-10-17 MED ORDER — IOHEXOL 350 MG/ML SOLN
80.0000 mL | Freq: Once | INTRAVENOUS | Status: AC | PRN
  Administered 2024-10-17: 80 mL via INTRAVENOUS

## 2024-10-20 ENCOUNTER — Encounter: Payer: Self-pay | Admitting: Radiology

## 2024-10-23 ENCOUNTER — Ambulatory Visit: Payer: Self-pay | Admitting: Cardiovascular Disease

## 2024-10-23 NOTE — Therapy (Signed)
 OUTPATIENT OCCUPATIONAL THERAPY TREATMENT NOTE Patient Name: Jose [name] MRN: [id number] DOB:[birth year], 73 y.o., male Today's Date: 10/23/2024  REFERRING PROVIDER: Arlinda Buster, MD    END OF SESSION:   Past Medical History:  Diagnosis Date   Arthritis    R shoulder, great toes, hands- has gout & has injections in knees with Dr. [name]    Bicuspid aortic valve with ascending aorta 4.0 to 4.5 cm in diameter    CAD (coronary artery disease)    Cancer (HCC)    skin ca-    GERD (gastroesophageal reflux disease)    Headache    History of hiatal hernia    Hypertension    Prostate cancer (HCC)    S/P TAVR (transcatheter aortic valve replacement)    SDH (subdural hematoma) (HCC)    Severe aortic stenosis    Thoracic ascending aortic aneurysm    Vertebral artery aneurysm    Past Surgical History:  Procedure Laterality Date   BRAIN SURGERY  2016   evacuation of SDH   CARDIAC CATHETERIZATION     CARPAL TUNNEL RELEASE Bilateral    embolization of left vertebral artery aneurysm with pipeline/coil  10/31/2022   GREEN LIGHT LASER TURP (TRANSURETHRAL RESECTION OF PROSTATE  04/27/2017   KNEE ARTHROPLASTY     LEFT HEART CATH AND CORONARY ANGIOGRAPHY N/A 10/08/2019   Procedure: LEFT HEART CATH AND CORONARY ANGIOGRAPHY;  Surgeon: Wonda Sharper, MD;  Location: [facility] INVASIVE CV LAB;  Service: Cardiovascular;  Laterality: N/A;   QUADRICEPS TENDON REPAIR Bilateral 06/15/2017   Procedure: REPAIR QUADRICEP TENDON;  Surgeon: Addie Cordella Glendia, MD;  Location: [facility] OR;  Service: Orthopedics;  Laterality: Bilateral;   SHOULDER SURGERY Right    TEE WITHOUT CARDIOVERSION N/A 07/03/2022   Procedure: TRANSESOPHAGEAL ECHOCARDIOGRAM (TEE);  Surgeon: Delford Maude BROCKS, MD;  Location: [facility] ENDOSCOPY;  Service: Cardiovascular;  Laterality: N/A;   TRANSCATHETER AORTIC VALVE REPLACEMENT, TRANSFEMORAL  12/23/2019   TRANSCATHETER AORTIC VALVE REPLACEMENT, TRANSFEMORAL N/A 12/23/2019   Procedure:  TRANSCATHETER AORTIC VALVE REPLACEMENT, TRANSFEMORAL;  Surgeon: Wonda Sharper, MD;  Location: [facility] OR;  Service: Open Heart Surgery;  Laterality: N/A;   VASCULAR SURGERY     Patient Active Problem List   Diagnosis Date Noted   Hypertension    Severe aortic stenosis 10/08/2019   Candidiasis    Abnormality of gait    Hypoalbuminemia due to protein-calorie malnutrition    History of gout    Sleep disturbance    Constipation    Rupture of quadriceps tendon, right, sequela 06/19/2017   Quadriceps tendon rupture, left, sequela 06/19/2017   Acute blood loss anemia 06/19/2017   Fall    History of subdural hematoma    Post-operative pain    Recurrent falls    Quadriceps tendon rupture 06/15/2017   Primary osteoarthritis of both knees 02/28/2017   Primary osteoarthritis of both hands 02/28/2017   Uricacidemia 02/28/2017   Pain in joint of left knee 02/07/2017   Idiopathic chronic gout, unspecified site, without tophus (tophi) 11/29/2016   HEMATURIA UNSPECIFIED 01/25/2009   Abdominal pain 01/25/2009   XERODERMA 03/10/2008   UNS ADVRS EFF UNS RX MEDICINAL&BIOLOGICAL SBSTNC 03/10/2008   ADJUSTMENT REACTION WITH PHYSICAL SYMPTOMS 02/14/2008   MUSCLE SPASM 02/14/2008   ACUTE PROSTATITIS 12/16/2007   BREAST LUMP OR MASS, RIGHT 07/12/2007   H/O: RCT (rotator cuff tear) 01/13/2007   GOUT 01/08/2007     ONSET DATE:  DOS 07/24/24  REFERRING DIAG:  F34.668 (ICD-10-CM) - Trigger finger, right middle finger  M65.341 (ICD-10-CM) -  Trigger finger, right ring finger    THERAPY DIAG:     No diagnosis found.  Rationale for Evaluation and Treatment: Rehabilitation  PERTINENT HISTORY:  The patient states hx of triggering and pain in their hand and subsequent surgical release. The patient states having some stiffness, pain, decreased ability to make a fist and perform I/ADLs.     PRECAUTIONS: None relative to this evaluation and episode of care.   RED FLAGS: None   WEIGHT BEARING  RESTRICTIONS: WBAT now    SUBJECTIVE:   SUBJECTIVE STATEMENT: The patient is now approx 12+ weeks s/p Rt hand RF, MF finger TFRs.  He states ***     his hand is less swollen today and the triggering sensation that he feels is not so apparent, but it is still there in the middle and ring fingers of the right hand.  He does state trying to be cautious and not cause pain.  He also states some mild aching near the [facility] joint of the thumb volarly    PAIN:  Are you having pain?  ***  2/10 at rest ache, and simiar at worst in past week, pain does go up to 4-5/10 at worst in the week with heavy activities.   PATIENT GOALS: To improve motion, function with affected surgical hand  NEXT MD VISIT: PRN    OBJECTIVE MEASURES:   ADLs: Overall ADLs: States decreased ability to grab, hold household objects, pain and difficulty to open containers, perform FMS tasks (manipulate fasteners on clothing).     UPPER EXTREMITY ROM:     A/ROM Right eval Rt 08/26/24 Rt 09/15/24 Rt 09/23/24 Rt 10/24/24  Wrist flexion Approx 60 49 67 64 ***  Wrist extension Approx 55 69 61 65 ***  (Blank rows = not tested)                    Hand A/ROM Right eval Rt 08/26/24  Full Fist Ability (or Gap to Distal Palmar Crease) Fingers loosely touch palm, able to extend with some minimal lag at middle finger PIP joint Full fist now   Thumb Opposition  (Kapandji Scale)  8/10, WFL 9/10  (Blank rows = not tested)   HAND STRENGTH & FUNCTION: 10/24/24: Grip: Rt: ***#    09/23/24: Grip Rt: 42#    09/15/24 Grip: 53#   08/26/24 grip strength Rt: 40# no pain    Eval: Observed weakness in affected right hand/arm, grossly 3-/5 MMT, but specific gripping and resistance training contraindicated today.  Per protocols we will begin light grip training in about 2 weeks.   SENSATION: Eval:  Light touch mildly diminished especially through sx area. Expected to improve with HEP and recommendations.   EDEMA:   09/23/24: 21cm  bil circumferentially around MCP Js, but P1 of digits 3,4 are both 2-58mm more swollen than Lt hand    Eval:  Mildly swollen in surgical hand today.  Expected to improve with HEP and recommendations.    Observations:  10/15/24: Jose [name] has palpable triggering in the right hand volar area near the MCP joints of the middle and ring fingers.  It is light, but it is present.  It is tender to him.  OT is unsure why he has triggering again after trigger finger release-though a theory is that he build up new scar tissue when he hurt his hand approximately 4 weeks postop after taking a long trip in the car.  Another potential theory could be that surgery was not 100% successful  for him.  The surgeon also states that he has the gene for Dupuytren's contracture which may be causing extra tightness and cording through his palm leading to potential triggering.  OT is working to compensate and modify due to these factors to help give the patient some relief.  TODAY'S TREATMENT:  10/24/24: *** Continue to monitor triggering, work on scar mobility, help find bracing options to find relief.  Try to compensate for functional tasks       10/15/24:   Due to ongoing pain, tenderness, triggering-OT educates the patient to treat his right hand as if he is having active trigger fingers in the middle and ring fingers.  Again he was recommended to use Band-Aids or double Band-Aids over the PIP joints to prevent a full tight fist and to prevent triggering.  He should limit rubbing and triggering within his palm by limiting heavy gripping and full range of motion into a fist.  He states understanding.  Next, he was highly recommended to continue doing scar mobilizations aggressively but in a nonpainful fashion.  OT does manual therapy scar mobilization cupping with him today which he tolerates fairly well, and after which triggering is somewhat improved in the ring finger but still slightly present in the middle finger.  We  also quickly review his home exercise program which includes intrinsic minus or hook stretches which are probably the most important stretch for him other than prayer stretches.  He also states using therapy putty as a bolster to gently get a passive stretch through the fingers which OT agrees is a good strategy.    We briefly discussed his functional ability and he states that he would like to continue occupational therapy treatment due to these continued issues.  He states having a bit better functional ability, but still being limited with strong grip and full functional activities.  Due to that, we will continue occupational therapy at least for another 3 to 4 weeks to help symptoms resolve, if possible.    PATIENT EDUCATION: Education details: See tx section above for details  Person educated: Patient Education method: Verbal Instruction, Teach back, Handouts  Education comprehension: States and demonstrates understanding   HOME EXERCISE PROGRAM: Access Code: [email or web address] URL: [email or web address] Date: 08/14/2024 Prepared by: Melvenia Ada   GOALS: Goals reviewed with patient? Yes   Long TERM GOALS: (STG required if POC>30 days) Target Date: 11/14/2024  1.  Pt will demo/state understanding of initial HEP and therapist recommendations to improve pain levels, improve motions and ability and eventually return to normal activities.   Goal status: 08/14/24 MET  2.  In 2 weeks, patient will tolerate gripping and pinching with therapy putty and learned a home exercise program to improve his ability and status for IADLs.  GOAL STATUS: 09/15/24: MET  3.  Patient will have 5/5 strength in right wrist and hand, states no significant pain or swelling in order to have full and robust IADLs.  GOAL STATUS:10/15/24: improving, but not met... Still tender and painful in the volar palm and still slightly tender near the base of the thumb.  4.  Patient will have pain at worst in  the past week less than 2/10, for better functional performance and quality of life  Goal status: New goal today (10/15/2024)        ASSESSMENT:  CLINICAL IMPRESSION: 10/24/24: ***  10/15/24: Due to unforeseen circumstances, the patient has developed triggering and pain in his right hand again.  We have been trying to manage this  conservatively, and OT has been adjusting the plan of care.  He has been recommended to allow for healing time, braces fingers, gently stretch, avoid heavy activities.  Due to this exacerbation, we are requesting additional therapy for 4 weeks to help the patient meet his goals and get relief.    PLAN:  OT FREQUENCY: Up to 1 time a week  OT DURATION: up to 4 additional weeks from 10/15/2024 through 11/14/2024 as needed, and up to 6 total visits.  PLANNED INTERVENTIONS: self care/ADL training, therapeutic exercise, therapeutic activity, neuromuscular re-education, manual therapy, scar mobilization, passive range of motion, splinting, ultrasound, fluidotherapy, compression bandaging, moist heat, cryotherapy, contrast bath, patient/family education, energy conservation, coping strategies training, and Re-evaluation  RECOMMENDED OTHER SERVICES: Is in physical therapy for recent right shoulder pain.  PT is working with OT to ensure no exacerbation of the right hand  CONSULTED AND AGREED WITH PLAN OF CARE: Patient  PLAN FOR NEXT SESSION:   ***  Melvenia Ada, OTR/L, CHT 10/23/2024, 4:27 PM

## 2024-10-24 ENCOUNTER — Encounter: Payer: Self-pay | Admitting: Rehabilitative and Restorative Service Providers"

## 2024-10-24 ENCOUNTER — Ambulatory Visit (INDEPENDENT_AMBULATORY_CARE_PROVIDER_SITE_OTHER): Admitting: Rehabilitative and Restorative Service Providers"

## 2024-10-24 DIAGNOSIS — M6281 Muscle weakness (generalized): Secondary | ICD-10-CM

## 2024-10-24 DIAGNOSIS — M79641 Pain in right hand: Secondary | ICD-10-CM | POA: Diagnosis not present

## 2024-10-24 DIAGNOSIS — R600 Localized edema: Secondary | ICD-10-CM

## 2024-10-24 NOTE — Progress Notes (Signed)
 Office Visit Note  Patient: Jose [name]             Date of Birth: 27-Jan-1951           MRN: [id number]             PCP: Birda Ahumada, MD Referring: Birda Ahumada, MD Visit Date: 11/04/2024 Occupation: Data Unavailable  Subjective:  Right middle finger pain  History of Present Illness: Jose [name] is a 73 y.o. male with gout and osteoarthritis.  He returns today after his last visit in July 2025.  He underwent a right middle and ring finger release by Dr. Arlinda in August.  He states he had physical therapy after that.  He has noticed improvement in his right ring finger but no improvement in the right middle finger.  He has been experiencing nocturnal pain in his right middle finger.  He is unable to take NSAIDs due to his underlying heart disease.  He has not had any gout flares since the last visit    Activities of Daily Living:  Patient reports morning stiffness for 15 minutes.   Patient Reports nocturnal pain.  Difficulty dressing/grooming: Denies Difficulty climbing stairs: Reports Difficulty getting out of chair: Denies Difficulty using hands for taps, buttons, cutlery, and/or writing: Denies  Review of Systems  Constitutional:  Positive for fatigue.  HENT:  Negative for mouth sores and mouth dryness.   Eyes:  Negative for dryness.  Respiratory:  Negative for shortness of breath.   Cardiovascular:  Negative for chest pain and palpitations.  Gastrointestinal:  Negative for blood in stool, constipation and diarrhea.  Endocrine: Negative for increased urination.  Genitourinary:  Negative for involuntary urination.  Musculoskeletal:  Positive for joint pain, joint pain and morning stiffness. Negative for gait problem, joint swelling, myalgias, muscle weakness, muscle tenderness and myalgias.  Skin:  Negative for color change, rash and sensitivity to sunlight.  Allergic/Immunologic: Negative for susceptible to infections.  Neurological:  Negative for dizziness  and headaches.  Hematological:  Positive for bruising/bleeding tendency. Negative for swollen glands.  Psychiatric/Behavioral:  Negative for depressed mood and sleep disturbance. The patient is not nervous/anxious.     PMFS History:  Patient Active Problem List   Diagnosis Date Noted   Hypertension    Severe aortic stenosis 10/08/2019   Candidiasis    Abnormality of gait    Hypoalbuminemia due to protein-calorie malnutrition    History of gout    Sleep disturbance    Constipation    Rupture of quadriceps tendon, right, sequela 06/19/2017   Quadriceps tendon rupture, left, sequela 06/19/2017   Acute blood loss anemia 06/19/2017   Fall    History of subdural hematoma    Post-operative pain    Recurrent falls    Quadriceps tendon rupture 06/15/2017   Primary osteoarthritis of both knees 02/28/2017   Primary osteoarthritis of both hands 02/28/2017   Uricacidemia 02/28/2017   Pain in joint of left knee 02/07/2017   Idiopathic chronic gout, unspecified site, without tophus (tophi) 11/29/2016   HEMATURIA UNSPECIFIED 01/25/2009   Abdominal pain 01/25/2009   XERODERMA 03/10/2008   UNS ADVRS EFF UNS RX MEDICINAL&BIOLOGICAL SBSTNC 03/10/2008   ADJUSTMENT REACTION WITH PHYSICAL SYMPTOMS 02/14/2008   MUSCLE SPASM 02/14/2008   ACUTE PROSTATITIS 12/16/2007   BREAST LUMP OR MASS, RIGHT 07/12/2007   H/O: RCT (rotator cuff tear) 01/13/2007   GOUT 01/08/2007    Past Medical History:  Diagnosis Date   Arthritis    R shoulder, great  toes, hands- has gout & has injections in knees with Dr. [name]    Bicuspid aortic valve with ascending aorta 4.0 to 4.5 cm in diameter    CAD (coronary artery disease)    Cancer (HCC)    skin ca-    GERD (gastroesophageal reflux disease)    Headache    History of hiatal hernia    Hypertension    Prostate cancer (HCC)    S/P TAVR (transcatheter aortic valve replacement)    SDH (subdural hematoma) (HCC)    Severe aortic stenosis    Thoracic ascending  aortic aneurysm    Vertebral artery aneurysm     Family History  Problem Relation Age of Onset   Parkinson's disease Mother    Heart disease Father    Parkinson's disease Brother    Diabetes Brother    Past Surgical History:  Procedure Laterality Date   BRAIN SURGERY  2016   evacuation of SDH   CARDIAC CATHETERIZATION     CARPAL TUNNEL RELEASE Bilateral    embolization of left vertebral artery aneurysm with pipeline/coil  10/31/2022   GREEN LIGHT LASER TURP (TRANSURETHRAL RESECTION OF PROSTATE  04/27/2017   KNEE ARTHROPLASTY     LEFT HEART CATH AND CORONARY ANGIOGRAPHY N/A 10/08/2019   Procedure: LEFT HEART CATH AND CORONARY ANGIOGRAPHY;  Surgeon: Wonda Sharper, MD;  Location: [facility] INVASIVE CV LAB;  Service: Cardiovascular;  Laterality: N/A;   QUADRICEPS TENDON REPAIR Bilateral 06/15/2017   Procedure: REPAIR QUADRICEP TENDON;  Surgeon: Addie Cordella Hamilton, MD;  Location: [facility] OR;  Service: Orthopedics;  Laterality: Bilateral;   SHOULDER SURGERY Right    TEE WITHOUT CARDIOVERSION N/A 07/03/2022   Procedure: TRANSESOPHAGEAL ECHOCARDIOGRAM (TEE);  Surgeon: Delford Maude BROCKS, MD;  Location: [facility] ENDOSCOPY;  Service: Cardiovascular;  Laterality: N/A;   TRANSCATHETER AORTIC VALVE REPLACEMENT, TRANSFEMORAL  12/23/2019   TRANSCATHETER AORTIC VALVE REPLACEMENT, TRANSFEMORAL N/A 12/23/2019   Procedure: TRANSCATHETER AORTIC VALVE REPLACEMENT, TRANSFEMORAL;  Surgeon: Wonda Sharper, MD;  Location: [facility] OR;  Service: Open Heart Surgery;  Laterality: N/A;   TRIGGER FINGER RELEASE Right 2025   3rd and 4th digit   VASCULAR SURGERY     Social History   Tobacco Use   Smoking status: Never    Passive exposure: Never   Smokeless tobacco: Never  Vaping Use   Vaping status: Never Used  Substance Use Topics   Alcohol  use: Yes    Comment: 1 or 2 glasses of wine weekly   Drug use: No   Social History   Social History Narrative   Not on file     Immunization History  Administered Date(s)  Administered   PFIZER(Purple Top)SARS-COV-2 Vaccination 02/19/2020, 03/17/2020, 10/25/2020, 08/30/2021   Pfizer Comirnaty Covid-19 vaccine 47yrs-79yrs 10/04/2022   Pfizer(Comirnaty)Fall Seasonal Vaccine 12 years and older 09/04/2024     Objective: Vital Signs: BP 105/73   Pulse (!) 57   Temp 97.8 F (36.6 C)   Resp 16   Ht 6' (1.829 m)   Wt 203 lb 3.2 oz (92.2 kg)   BMI 27.56 kg/m    Physical Exam Vitals and nursing note reviewed.  Constitutional:      Appearance: He is well-developed.  HENT:     Head: Normocephalic and atraumatic.  Eyes:     Conjunctiva/sclera: Conjunctivae normal.     Pupils: Pupils are equal, round, and reactive to light.  Cardiovascular:     Rate and Rhythm: Normal rate and regular rhythm.     Heart sounds: Normal heart sounds.  Pulmonary:     Effort: Pulmonary effort is normal.     Breath sounds: Normal breath sounds.  Abdominal:     General: Bowel sounds are normal.     Palpations: Abdomen is soft.  Musculoskeletal:     Cervical back: Normal range of motion and neck supple.  Skin:    General: Skin is warm and dry.     Capillary Refill: Capillary refill takes less than 2 seconds.  Neurological:     Mental Status: He is alert and oriented to person, place, and time.  Psychiatric:        Behavior: Behavior normal.      Musculoskeletal Exam: He had good range of cervical spine.  Shoulder joint abduction was limited to about 60 degrees bilaterally.  Elbow joints and wrist joints with good range of motion.  Had bilateral CMC PIP and DIP thickening.  Scar tissue was noted over the flexor aspect of his right middle and ring finger where he had surgery.  No synovitis was noted.  Hip joints and knee joints in good range of motion.  There was no tenderness over ankles or MTPs.  CDAI Exam: CDAI Score: -- Patient Global: --; Provider Global: -- Swollen: --; Tender: -- Joint Exam 11/04/2024   No joint exam has been documented for this visit   There is  currently no information documented on the homunculus. Go to the Rheumatology activity and complete the homunculus joint exam.  Investigation: No additional findings.  Imaging: CT ANGIO CHEST AORTA W/ & OR WO/CM & GATING (HEART & VASCULAR TOWER ONLY) Result Date: 10/22/2024 CLINICAL DATA:  Ascending thoracic aortic aneurysm EXAM: CT ANGIOGRAPHY CHEST WITH CONTRAST TECHNIQUE: Multidetector CT imaging of the chest was performed using the standard protocol during bolus administration of intravenous contrast. Multiplanar CT image reconstructions and MIPs were obtained to evaluate the vascular anatomy. RADIATION DOSE REDUCTION: This exam was performed according to the departmental dose-optimization program which includes automated exposure control, adjustment of the mA and/or kV according to patient size and/or use of iterative reconstruction technique. CONTRAST:  80mL OMNIPAQUE  IOHEXOL  350 MG/ML SOLN COMPARISON:  05/24/2022 FINDINGS: Cardiovascular: There is a 4.3 cm ascending thoracic aortic aneurysm. No evidence of dissection. Atherosclerosis of the aortic arch. The heart is unremarkable, with aortic valve prosthesis identified. Coronary artery atherosclerosis. There is technically adequate opacification of the pulmonary vasculature. No filling defects or pulmonary emboli. Mediastinum/Nodes: No enlarged mediastinal, hilar, or axillary lymph nodes. Thyroid  gland, trachea, and esophagus demonstrate no significant findings. Lungs/Pleura: No acute airspace disease, effusion, or pneumothorax. Central airways are patent. Upper Abdomen: No acute abnormality. Musculoskeletal: No acute or destructive bony abnormalities. Severe osteoarthritis of the right glenohumeral joint. Reconstructed images demonstrate no additional findings. Review of the MIP images confirms the above findings. IMPRESSION: 1. 4.3 cm ascending thoracic aortic aneurysm. No evidence of dissection. Recommend annual imaging followup by CTA or MRA. This  recommendation follows 2010 ACCF/AHA/AATS/ACR/ASA/SCA/SCAI/SIR/STS/SVM Guidelines for the Diagnosis and Management of Patients with Thoracic Aortic Disease. Circulation. 2010; 121: Z733-z630. Aortic aneurysm NOS (ICD10-I71.9) 2. Aortic valve prosthesis. 3. Aortic Atherosclerosis (ICD10-I70.0). Coronary artery atherosclerosis. Electronically Signed   By: Ozell Daring M.D.   On: 10/22/2024 21:57    Recent Labs: Lab Results  Component Value Date   WBC 8.1 09/25/2024   HGB 14.5 09/25/2024   PLT 149 (L) 09/25/2024   NA 143 09/25/2024   K 4.5 09/25/2024   CL 107 (H) 09/25/2024   CO2 21 09/25/2024   GLUCOSE 99 09/25/2024  BUN 18 09/25/2024   CREATININE 1.28 (H) 09/25/2024   BILITOT 0.3 09/25/2024   ALKPHOS 76 09/25/2024   AST 18 09/25/2024   ALT 19 09/25/2024   PROT 6.4 09/25/2024   ALBUMIN 4.2 09/25/2024   CALCIUM  9.6 09/25/2024   GFRAA 70 05/05/2021    Speciality Comments: No specialty comments available.  Procedures:  No procedures performed Allergies: Ciprofloxacin and Quinolones   Assessment / Plan:     Visit Diagnoses: Idiopathic chronic gout of multiple sites without tophus -he has not had gout flares since the last visit.  He has been on allopurinol  300 mg p.o. daily colchicine  as needed.  Uric acid was 3.2 on June 25, 2024.  Will check labs again prior to the next visit.  Chronic pain of both shoulders - Severe osteoarthritis involving both shoulders.  He has limited range of motion without much discomfort currently.  Primary osteoarthritis of both hands - Right CMC injection March 21, 2024.  He complains of discomfort in the right third PIP joint.  He has a handout on hand exercises from physical therapy which were encouraged.  Medication monitoring encounter-September 25, 2024 CBC showed platelets 149 and creatinine elevated at 1.28.  Trigger middle finger of right hand-he had right middle finger release in August.  He continues to have some discomfort and intermittent  triggering.  Use of topical Voltaren  gel was advised.  He states his finger gets locked at night.  I advised him to try splinting his finger at nighttime.  Trigger finger, right ring finger-good response to the trigger finger release.  Primary osteoarthritis of both knees-he has intermittent discomfort.  No warmth swelling or effusion was noted.  Quadriceps tendon rupture, left, sequela  Rupture of quadriceps tendon, right, sequela  Primary osteoarthritis of both feet  Neuropathy  Subdural hematoma (HCC)  H/O aortic valve replacement  [place]  Orders: No orders of the defined types were placed in this encounter.  No orders of the defined types were placed in this encounter.  .  Follow-Up Instructions: Return in about 6 months (around 05/04/2025) for Gout, Osteoarthritis.   Maya Nash, MD  Note - This record has been created using Animal nutritionist.  Chart creation errors have been sought, but may not always  have been located. Such creation errors do not reflect on  the standard of medical care.

## 2024-10-28 NOTE — Therapy (Signed)
 OUTPATIENT OCCUPATIONAL THERAPY TREATMENT NOTE Patient Name: Jose [name] MRN: [id number] DOB:1951-06-07, 73 y.o., male Today's Date: 10/29/2024  REFERRING PROVIDER: Arlinda Buster, MD       END OF SESSION:   Past Medical History:  Diagnosis Date   Arthritis    R shoulder, great toes, hands- has gout & has injections in knees with Dr. [name]    Bicuspid aortic valve with ascending aorta 4.0 to 4.5 cm in diameter    CAD (coronary artery disease)    Cancer (HCC)    skin ca-    GERD (gastroesophageal reflux disease)    Headache    History of hiatal hernia    Hypertension    Prostate cancer (HCC)    S/P TAVR (transcatheter aortic valve replacement)    SDH (subdural hematoma) (HCC)    Severe aortic stenosis    Thoracic ascending aortic aneurysm    Vertebral artery aneurysm    Past Surgical History:  Procedure Laterality Date   BRAIN SURGERY  2016   evacuation of SDH   CARDIAC CATHETERIZATION     CARPAL TUNNEL RELEASE Bilateral    embolization of left vertebral artery aneurysm with pipeline/coil  10/31/2022   GREEN LIGHT LASER TURP (TRANSURETHRAL RESECTION OF PROSTATE  04/27/2017   KNEE ARTHROPLASTY     LEFT HEART CATH AND CORONARY ANGIOGRAPHY N/A 10/08/2019   Procedure: LEFT HEART CATH AND CORONARY ANGIOGRAPHY;  Surgeon: Jose Sharper, MD;  Location: [facility] INVASIVE CV LAB;  Service: Cardiovascular;  Laterality: N/A;   QUADRICEPS TENDON REPAIR Bilateral 06/15/2017   Procedure: REPAIR QUADRICEP TENDON;  Surgeon: Jose Cordella Glendia, MD;  Location: [facility] OR;  Service: Orthopedics;  Laterality: Bilateral;   SHOULDER SURGERY Right    TEE WITHOUT CARDIOVERSION N/A 07/03/2022   Procedure: TRANSESOPHAGEAL ECHOCARDIOGRAM (TEE);  Surgeon: Jose Maude BROCKS, MD;  Location: [facility] ENDOSCOPY;  Service: Cardiovascular;  Laterality: N/A;   TRANSCATHETER AORTIC VALVE REPLACEMENT, TRANSFEMORAL  12/23/2019   TRANSCATHETER AORTIC VALVE REPLACEMENT, TRANSFEMORAL N/A 12/23/2019   Procedure:  TRANSCATHETER AORTIC VALVE REPLACEMENT, TRANSFEMORAL;  Surgeon: Jose Sharper, MD;  Location: [facility] OR;  Service: Open Heart Surgery;  Laterality: N/A;   VASCULAR SURGERY     Patient Active Problem List   Diagnosis Date Noted   Hypertension    Severe aortic stenosis 10/08/2019   Candidiasis    Abnormality of gait    Hypoalbuminemia due to protein-calorie malnutrition    History of gout    Sleep disturbance    Constipation    Rupture of quadriceps tendon, right, sequela 06/19/2017   Quadriceps tendon rupture, left, sequela 06/19/2017   Acute blood loss anemia 06/19/2017   Fall    History of subdural hematoma    Post-operative pain    Recurrent falls    Quadriceps tendon rupture 06/15/2017   Primary osteoarthritis of both knees 02/28/2017   Primary osteoarthritis of both hands 02/28/2017   Uricacidemia 02/28/2017   Pain in joint of left knee 02/07/2017   Idiopathic chronic gout, unspecified site, without tophus (tophi) 11/29/2016   HEMATURIA UNSPECIFIED 01/25/2009   Abdominal pain 01/25/2009   XERODERMA 03/10/2008   UNS ADVRS EFF UNS RX MEDICINAL&BIOLOGICAL SBSTNC 03/10/2008   ADJUSTMENT REACTION WITH PHYSICAL SYMPTOMS 02/14/2008   MUSCLE SPASM 02/14/2008   ACUTE PROSTATITIS 12/16/2007   BREAST LUMP OR MASS, RIGHT 07/12/2007   H/O: RCT (rotator cuff tear) 01/13/2007   GOUT 01/08/2007     ONSET DATE:  DOS 07/24/24  REFERRING DIAG:  F34.668 (ICD-10-CM) - Trigger finger, right middle finger  M65.341 (ICD-10-CM) - Trigger finger, right ring finger    THERAPY DIAG:     Muscle weakness (generalized)  Localized edema  Pain in right hand  Rationale for Evaluation and Treatment: Rehabilitation  PERTINENT HISTORY:  The patient states hx of triggering and pain in their hand and subsequent surgical release. The patient states having some stiffness, pain, decreased ability to make a fist and perform I/ADLs.     PRECAUTIONS: None relative to this evaluation and episode of  care.   RED FLAGS: None   WEIGHT BEARING RESTRICTIONS: WBAT now    SUBJECTIVE:   SUBJECTIVE STATEMENT: The patient is now approx 12+ weeks s/p Rt hand RF, MF finger TFRs.  He states no pain at rest, still feels very light triggering internally that feels more proximal in his palm near forming cord that may be indicative of early stage Dupuytren's contracture.     PAIN:  Are you having pain?  0/10 at rest   PATIENT GOALS: To improve motion, function with affected surgical hand  NEXT MD VISIT: PRN    OBJECTIVE MEASURES:   ADLs: Overall ADLs: States decreased ability to grab, hold household objects, pain and difficulty to open containers, perform FMS tasks (manipulate fasteners on clothing).     UPPER EXTREMITY ROM:     A/ROM Right eval Rt 08/26/24 Rt 09/15/24 Rt 09/23/24  Wrist flexion Approx 60 49 67 64  Wrist extension Approx 55 69 61 65  (Blank rows = not tested)                    Hand A/ROM Right eval Rt 08/26/24  Full Fist Ability (or Gap to Distal Palmar Crease) Fingers loosely touch palm, able to extend with some minimal lag at middle finger PIP joint Full fist now   Thumb Opposition  (Kapandji Scale)  8/10, WFL 9/10  (Blank rows = not tested)   HAND STRENGTH & FUNCTION: 10/29/24: Grip Rt: 59#    10/24/24: Grip: Rt: 58.3#    09/23/24: Grip Rt: 42#    09/15/24 Grip: 53#   08/26/24 grip strength Rt: 40# no pain    Eval: Observed weakness in affected right hand/arm, grossly 3-/5 MMT, but specific gripping and resistance training contraindicated today.  Per protocols we will begin light grip training in about 2 weeks.   SENSATION: Eval:  Light touch mildly diminished especially through sx area. Expected to improve with HEP and recommendations.   EDEMA:   09/23/24: 21cm bil circumferentially around MCP Js, but P1 of digits 3,4 are both 2-78mm more swollen than Lt hand    Eval:  Mildly swollen in surgical hand today.  Expected to improve with HEP and  recommendations.    Observations:  10/15/24: Jose [name] has palpable triggering in the right hand volar area near the MCP joints of the middle and ring fingers.  It is light, but it is present.  It is tender to him.  OT is unsure why he has triggering again after trigger finger release-though a theory is that he build up new scar tissue when he hurt his hand approximately 4 weeks postop after taking a long trip in the car.  Another potential theory could be that surgery was not 100% successful for him.  The surgeon also states that he has the gene for Dupuytren's contracture which may be causing extra tightness and cording through his palm leading to potential triggering.  OT is working to compensate and modify due to these factors to help  give the patient some relief.  TODAY'S TREATMENT:  10/29/24: Today his scar looks smoother and flatter and less swollen, however he has an area standing out more proximally in his palm that concerns him.  This does appear to be Dupuytren's contracture forming in the palmar fascia, and it is largely not painful for him.  He has no pain today, so we upgraded again to isometric strengthening as tolerated.  He is taught to do this in a way that limits pain and any possible triggering.  He states the triggering is still mildly present but no longer painful at all.  He does demonstrate a full fist and has no glove or brace on today.  We reviewed his home exercises which he states understanding with the addition of new isometric grip training.  He is told to continue to maintain this and when he returns next week, if his symptoms are still very mild or improved we will likely discharge therapy to his own self-care.  He states understanding and is in agreement    PATIENT EDUCATION: Education details: See tx section above for details  Person educated: Patient Education method: Verbal Instruction, Teach back, Handouts  Education comprehension: States and demonstrates  understanding   HOME EXERCISE PROGRAM: Access Code: [email or web address] URL: [email or web address] Date: 08/14/2024 Prepared by: Melvenia Ada   GOALS: Goals reviewed with patient? Yes   Long TERM GOALS: (STG required if POC>30 days) Target Date: 11/14/2024  1.  Pt will demo/state understanding of initial HEP and therapist recommendations to improve pain levels, improve motions and ability and eventually return to normal activities.   Goal status: 08/14/24 MET  2.  In 2 weeks, patient will tolerate gripping and pinching with therapy putty and learned a home exercise program to improve his ability and status for IADLs.  GOAL STATUS: 09/15/24: MET  3.  Patient will have 5/5 strength in right wrist and hand, states no significant pain or swelling in order to have full and robust IADLs.  GOAL STATUS:10/15/24: improving, but not met... Still tender and painful in the volar palm and still slightly tender near the base of the thumb.  4.  Patient will have pain at worst in the past week less than 2/10, for better functional performance and quality of life  Goal status: New goal today (10/15/2024)        ASSESSMENT:  CLINICAL IMPRESSION: 10/29/24: Doing excellent now, no significant pain, full fist, improving grip strength, discharge next week if all goals are meant and symptoms remain low   10/24/24: His symptoms are fortunately resolving and he is doing better, however he would still like to continue therapy until symptoms have fully resolved. OT will request additional visits today, but timeframe is still good.  10/15/24: Due to unforeseen circumstances, the patient has developed triggering and pain in his right hand again.  We have been trying to manage this conservatively, and OT has been adjusting the plan of care.  He has been recommended to allow for healing time, braces fingers, gently stretch, avoid heavy activities.  Due to this exacerbation, we are requesting additional  therapy for 4 weeks to help the patient meet his goals and get relief.    PLAN:  OT FREQUENCY: Up to 1 time a week  OT DURATION:  4 weeks from 10/15/2024 through 11/14/2024 as needed, and up to 10 total visits.  PLANNED INTERVENTIONS: self care/ADL training, therapeutic exercise, therapeutic activity, neuromuscular re-education, manual therapy, scar mobilization, passive range of motion, splinting, ultrasound, fluidotherapy, compression  bandaging, moist heat, cryotherapy, contrast bath, patient/family education, energy conservation, coping strategies training, and Re-evaluation  RECOMMENDED OTHER SERVICES: Is in physical therapy for recent right shoulder pain.  PT is working with OT to ensure no exacerbation of the right hand  CONSULTED AND AGREED WITH PLAN OF CARE: Patient  PLAN FOR NEXT SESSION:   Consider discharge   Melvenia Ada, OTR/L, CHT 10/29/2024, 2:15 PM

## 2024-10-29 ENCOUNTER — Ambulatory Visit: Admitting: Rehabilitative and Restorative Service Providers"

## 2024-10-29 ENCOUNTER — Encounter: Payer: Self-pay | Admitting: Rehabilitative and Restorative Service Providers"

## 2024-10-29 DIAGNOSIS — R600 Localized edema: Secondary | ICD-10-CM

## 2024-10-29 DIAGNOSIS — M6281 Muscle weakness (generalized): Secondary | ICD-10-CM | POA: Diagnosis not present

## 2024-10-29 DIAGNOSIS — M79641 Pain in right hand: Secondary | ICD-10-CM

## 2024-11-03 ENCOUNTER — Encounter: Admitting: Rehabilitative and Restorative Service Providers"

## 2024-11-03 NOTE — Therapy (Signed)
 OUTPATIENT OCCUPATIONAL THERAPY TREATMENT & ***  NOTE Patient Name: [name] MRN: [id number] DOB:03-24-1951, 73 y.o., male Today's Date: 11/03/2024  REFERRING PROVIDER: Arlinda Buster, MD                 ***              END OF SESSION:   Past Medical History:  Diagnosis Date   Arthritis    R shoulder, great toes, hands- has gout & has injections in knees with Dr. [name]    Bicuspid aortic valve with ascending aorta 4.0 to 4.5 cm in diameter    CAD (coronary artery disease)    Cancer (HCC)    skin ca-    GERD (gastroesophageal reflux disease)    Headache    History of hiatal hernia    Hypertension    Prostate cancer (HCC)    S/P TAVR (transcatheter aortic valve replacement)    SDH (subdural hematoma) (HCC)    Severe aortic stenosis    Thoracic ascending aortic aneurysm    Vertebral artery aneurysm    Past Surgical History:  Procedure Laterality Date   BRAIN SURGERY  2016   evacuation of SDH   CARDIAC CATHETERIZATION     CARPAL TUNNEL RELEASE Bilateral    embolization of left vertebral artery aneurysm with pipeline/coil  10/31/2022   GREEN LIGHT LASER TURP (TRANSURETHRAL RESECTION OF PROSTATE  04/27/2017   KNEE ARTHROPLASTY     LEFT HEART CATH AND CORONARY ANGIOGRAPHY N/A 10/08/2019   Procedure: LEFT HEART CATH AND CORONARY ANGIOGRAPHY;  Surgeon: Jose Sharper, MD;  Location: [name] INVASIVE CV LAB;  Service: Cardiovascular;  Laterality: N/A;   QUADRICEPS TENDON REPAIR Bilateral 06/15/2017   Procedure: REPAIR QUADRICEP TENDON;  Surgeon: Jose Cordella Glendia, MD;  Location: [name];  Service: Orthopedics;  Laterality: Bilateral;   SHOULDER SURGERY Right    TEE WITHOUT CARDIOVERSION N/A 07/03/2022   Procedure: TRANSESOPHAGEAL ECHOCARDIOGRAM (TEE);  Surgeon: Jose Maude BROCKS, MD;  Location: [name] ENDOSCOPY;  Service: Cardiovascular;  Laterality: N/A;   TRANSCATHETER AORTIC VALVE REPLACEMENT, TRANSFEMORAL  12/23/2019   TRANSCATHETER AORTIC  VALVE REPLACEMENT, TRANSFEMORAL N/A 12/23/2019   Procedure: TRANSCATHETER AORTIC VALVE REPLACEMENT, TRANSFEMORAL;  Surgeon: Jose Sharper, MD;  Location: [name] OR;  Service: Open Heart Surgery;  Laterality: N/A;   VASCULAR SURGERY     Patient Active Problem List   Diagnosis Date Noted   Hypertension    Severe aortic stenosis 10/08/2019   Candidiasis    Abnormality of gait    Hypoalbuminemia due to protein-calorie malnutrition    History of gout    Sleep disturbance    Constipation    Rupture of quadriceps tendon, right, sequela 06/19/2017   Quadriceps tendon rupture, left, sequela 06/19/2017   Acute blood loss anemia 06/19/2017   Fall    History of subdural hematoma    Post-operative pain    Recurrent falls    Quadriceps tendon rupture 06/15/2017   Primary osteoarthritis of both knees 02/28/2017   Primary osteoarthritis of both hands 02/28/2017   Uricacidemia 02/28/2017   Pain in joint of left knee 02/07/2017   Idiopathic chronic gout, unspecified site, without tophus (tophi) 11/29/2016   HEMATURIA UNSPECIFIED 01/25/2009   Abdominal pain 01/25/2009   XERODERMA 03/10/2008   UNS ADVRS EFF UNS RX MEDICINAL&BIOLOGICAL SBSTNC 03/10/2008   ADJUSTMENT REACTION WITH PHYSICAL SYMPTOMS 02/14/2008   MUSCLE SPASM 02/14/2008   ACUTE PROSTATITIS 12/16/2007   BREAST LUMP OR MASS, RIGHT 07/12/2007   H/O: RCT (rotator cuff  tear) 01/13/2007   GOUT 01/08/2007     ONSET DATE:  DOS 07/24/24  REFERRING DIAG:  F34.668 (ICD-10-CM) - Trigger finger, right middle finger  M65.341 (ICD-10-CM) - Trigger finger, right ring finger    THERAPY DIAG:     No diagnosis found.  Rationale for Evaluation and Treatment: Rehabilitation  PERTINENT HISTORY:  The patient states hx of triggering and pain in their hand and subsequent surgical release. The patient states having some stiffness, pain, decreased ability to make a fist and perform I/ADLs.     PRECAUTIONS: None relative to this evaluation and  episode of care.   RED FLAGS: None   WEIGHT BEARING RESTRICTIONS: WBAT now    SUBJECTIVE:   SUBJECTIVE STATEMENT: The patient is now approx 13+ weeks s/p Rt hand RF, MF finger TFRs.  He states ***     no pain at rest, still feels very light triggering internally that feels more proximal in his palm near forming cord that may be indicative of early stage Dupuytren's contracture.     PAIN:  Are you having pain?  *** 0/10 at rest   PATIENT GOALS: To improve motion, function with affected surgical hand  NEXT MD VISIT: PRN    OBJECTIVE MEASURES:   ADLs: Overall ADLs: States decreased ability to grab, hold household objects, pain and difficulty to open containers, perform FMS tasks (manipulate fasteners on clothing).     UPPER EXTREMITY ROM:     A/ROM Right eval Rt 08/26/24 Rt 09/15/24 Rt 09/23/24 Rt 11/05/24  Wrist flexion Approx 60 49 67 64 ***  Wrist extension Approx 55 69 61 65 ***  (Blank rows = not tested)                    Hand A/ROM Right eval Rt 08/26/24  Full Fist Ability (or Gap to Distal Palmar Crease) Fingers loosely touch palm, able to extend with some minimal lag at middle finger PIP joint Full fist now   Thumb Opposition  (Kapandji Scale)  8/10, WFL 9/10  (Blank rows = not tested)   HAND STRENGTH & FUNCTION: 11/05/24: Grip Rt: *** #     10/29/24: Grip Rt: 59#    10/24/24: Grip: Rt: 58.3#    09/23/24: Grip Rt: 42#    09/15/24 Grip: 53#   08/26/24 grip strength Rt: 40# no pain    Eval: Observed weakness in affected right hand/arm, grossly 3-/5 MMT, but specific gripping and resistance training contraindicated today.  Per protocols we will begin light grip training in about 2 weeks.   SENSATION: Eval:  Light touch mildly diminished especially through sx area. Expected to improve with HEP and recommendations.   EDEMA:   09/23/24: 21cm bil circumferentially around MCP Js, but P1 of digits 3,4 are both 2-56mm more swollen than Lt hand     Eval:  Mildly swollen in surgical hand today.  Expected to improve with HEP and recommendations.    Observations:  10/15/24: [name] has palpable triggering in the right hand volar area near the MCP joints of the middle and ring fingers.  It is light, but it is present.  It is tender to him.  OT is unsure why he has triggering again after trigger finger release-though a theory is that he build up new scar tissue when he hurt his hand approximately 4 weeks postop after taking a long trip in the car.  Another potential theory could be that surgery was not 100% successful for him.  The surgeon  also states that he has the gene for Dupuytren's contracture which may be causing extra tightness and cording through his palm leading to potential triggering.  OT is working to compensate and modify due to these factors to help give the patient some relief.  TODAY'S TREATMENT:  11/05/24: *** D/C?     10/29/24: Today his scar looks smoother and flatter and less swollen, however he has an area standing out more proximally in his palm that concerns him.  This does appear to be Dupuytren's contracture forming in the palmar fascia, and it is largely not painful for him.  He has no pain today, so we upgraded again to isometric strengthening as tolerated.  He is taught to do this in a way that limits pain and any possible triggering.  He states the triggering is still mildly present but no longer painful at all.  He does demonstrate a full fist and has no glove or brace on today.  We reviewed his home exercises which he states understanding with the addition of new isometric grip training.  He is told to continue to maintain this and when he returns next week, if his symptoms are still very mild or improved we will likely discharge therapy to his own self-care.  He states understanding and is in agreement    PATIENT EDUCATION: Education details: See tx section above for details  Person educated: Patient Education  method: Verbal Instruction, Teach back, Handouts  Education comprehension: States and demonstrates understanding   HOME EXERCISE PROGRAM: Access Code: [email or web address] URL: [email or web address] Date: 08/14/2024 Prepared by: Melvenia Ada   GOALS: Goals reviewed with patient? Yes   Long TERM GOALS: (STG required if POC>30 days) Target Date: 11/14/2024  1.  Pt will demo/state understanding of initial HEP and therapist recommendations to improve pain levels, improve motions and ability and eventually return to normal activities.   Goal status: 08/14/24 MET  2.  In 2 weeks, patient will tolerate gripping and pinching with therapy putty and learned a home exercise program to improve his ability and status for IADLs.  GOAL STATUS: 09/15/24: MET  3.  Patient will have 5/5 strength in right wrist and hand, states no significant pain or swelling in order to have full and robust IADLs.  GOAL STATUS:10/15/24: improving, but not met... Still tender and painful in the volar palm and still slightly tender near the base of the thumb.  4.  Patient will have pain at worst in the past week less than 2/10, for better functional performance and quality of life  Goal status: New goal today (10/15/2024)        ASSESSMENT:  CLINICAL IMPRESSION: 11/05/24: ***   10/29/24: Doing excellent now, no significant pain, full fist, improving grip strength, discharge next week if all goals are meant and symptoms remain low     PLAN:  OT FREQUENCY: Up to 1 time a week  OT DURATION:  4 weeks from 10/15/2024 through 11/14/2024 as needed, and up to 10 total visits.  PLANNED INTERVENTIONS: self care/ADL training, therapeutic exercise, therapeutic activity, neuromuscular re-education, manual therapy, scar mobilization, passive range of motion, splinting, ultrasound, fluidotherapy, compression bandaging, moist heat, cryotherapy, contrast bath, patient/family education, energy conservation, coping  strategies training, and Re-evaluation  RECOMMENDED OTHER SERVICES: Is in physical therapy for recent right shoulder pain.  PT is working with OT to ensure no exacerbation of the right hand  CONSULTED AND AGREED WITH PLAN OF CARE: Patient  PLAN FOR NEXT SESSION:   *** discharge  Melvenia Ada, OTR/L, CHT 11/03/2024, 3:09 PM

## 2024-11-04 ENCOUNTER — Ambulatory Visit: Attending: Rheumatology | Admitting: Rheumatology

## 2024-11-04 ENCOUNTER — Encounter: Payer: Self-pay | Admitting: Rheumatology

## 2024-11-04 VITALS — BP 105/73 | HR 57 | Temp 97.8°F | Resp 16 | Ht 72.0 in | Wt 203.2 lb

## 2024-11-04 DIAGNOSIS — M1A09X0 Idiopathic chronic gout, multiple sites, without tophus (tophi): Secondary | ICD-10-CM | POA: Insufficient documentation

## 2024-11-04 DIAGNOSIS — M19042 Primary osteoarthritis, left hand: Secondary | ICD-10-CM | POA: Insufficient documentation

## 2024-11-04 DIAGNOSIS — M25512 Pain in left shoulder: Secondary | ICD-10-CM | POA: Insufficient documentation

## 2024-11-04 DIAGNOSIS — M19041 Primary osteoarthritis, right hand: Secondary | ICD-10-CM | POA: Diagnosis present

## 2024-11-04 DIAGNOSIS — M19072 Primary osteoarthritis, left ankle and foot: Secondary | ICD-10-CM | POA: Insufficient documentation

## 2024-11-04 DIAGNOSIS — Z5181 Encounter for therapeutic drug level monitoring: Secondary | ICD-10-CM | POA: Diagnosis present

## 2024-11-04 DIAGNOSIS — M19071 Primary osteoarthritis, right ankle and foot: Secondary | ICD-10-CM | POA: Insufficient documentation

## 2024-11-04 DIAGNOSIS — M25511 Pain in right shoulder: Secondary | ICD-10-CM | POA: Diagnosis not present

## 2024-11-04 DIAGNOSIS — A809 Acute poliomyelitis, unspecified: Secondary | ICD-10-CM | POA: Insufficient documentation

## 2024-11-04 DIAGNOSIS — S76112S Strain of left quadriceps muscle, fascia and tendon, sequela: Secondary | ICD-10-CM | POA: Insufficient documentation

## 2024-11-04 DIAGNOSIS — G8929 Other chronic pain: Secondary | ICD-10-CM | POA: Diagnosis present

## 2024-11-04 DIAGNOSIS — G629 Polyneuropathy, unspecified: Secondary | ICD-10-CM | POA: Diagnosis present

## 2024-11-04 DIAGNOSIS — M170 Bilateral primary osteoarthritis of knee: Secondary | ICD-10-CM | POA: Diagnosis present

## 2024-11-04 DIAGNOSIS — M65341 Trigger finger, right ring finger: Secondary | ICD-10-CM | POA: Insufficient documentation

## 2024-11-04 DIAGNOSIS — S76111S Strain of right quadriceps muscle, fascia and tendon, sequela: Secondary | ICD-10-CM | POA: Diagnosis present

## 2024-11-04 DIAGNOSIS — Z952 Presence of prosthetic heart valve: Secondary | ICD-10-CM | POA: Diagnosis present

## 2024-11-04 DIAGNOSIS — M65331 Trigger finger, right middle finger: Secondary | ICD-10-CM | POA: Insufficient documentation

## 2024-11-04 DIAGNOSIS — M65351 Trigger finger, right little finger: Secondary | ICD-10-CM

## 2024-11-04 DIAGNOSIS — S065XAA Traumatic subdural hemorrhage with loss of consciousness status unknown, initial encounter: Secondary | ICD-10-CM | POA: Insufficient documentation

## 2024-11-05 ENCOUNTER — Ambulatory Visit (INDEPENDENT_AMBULATORY_CARE_PROVIDER_SITE_OTHER): Admitting: Rehabilitative and Restorative Service Providers"

## 2024-11-05 ENCOUNTER — Encounter: Payer: Self-pay | Admitting: Rehabilitative and Restorative Service Providers"

## 2024-11-05 DIAGNOSIS — M79641 Pain in right hand: Secondary | ICD-10-CM

## 2024-11-05 DIAGNOSIS — M6281 Muscle weakness (generalized): Secondary | ICD-10-CM

## 2024-11-05 DIAGNOSIS — R600 Localized edema: Secondary | ICD-10-CM | POA: Diagnosis not present

## 2024-11-21 ENCOUNTER — Encounter (HOSPITAL_BASED_OUTPATIENT_CLINIC_OR_DEPARTMENT_OTHER): Payer: Self-pay

## 2024-12-08 ENCOUNTER — Encounter: Payer: Self-pay | Admitting: Orthopedic Surgery

## 2024-12-20 ENCOUNTER — Other Ambulatory Visit: Payer: Self-pay | Admitting: Cardiovascular Disease

## 2024-12-22 ENCOUNTER — Other Ambulatory Visit: Payer: Self-pay | Admitting: Cardiovascular Disease

## 2024-12-22 NOTE — Telephone Encounter (Signed)
 Pt last saw Dr Delford 09/29/24, last labs 09/25/24 Creat 1.28, age 74, weight 92.2kg, based on specified criteria pt is on appropriate dosage of Eliquis  5mg  BID s/p TAVR.  Will refill rx.

## 2024-12-23 ENCOUNTER — Other Ambulatory Visit: Payer: Self-pay | Admitting: Orthopedic Surgery

## 2024-12-23 ENCOUNTER — Other Ambulatory Visit: Payer: Self-pay | Admitting: Physician Assistant

## 2024-12-23 NOTE — Telephone Encounter (Signed)
 Last Fill: 09/25/2024  Labs: 09/25/2024 Platelets 149, Creat 1.28, GFR 58, Chloride 107  Next Visit: 01/01/2025  Last Visit: 11/04/2024  DX:  Idiopathic chronic gout of multiple sites without tophus   Current Dose per office note 11/04/2024: allopurinol  300 mg p.o. daily    Okay to refill Allopurinol ?

## 2024-12-23 NOTE — Progress Notes (Unsigned)
 "  Office Visit Note  Patient: [name]             Date of Birth: 02-19-1951           MRN: [id number]             PCP: Birda Ahumada, MD Referring: Birda Ahumada, MD Visit Date: 01/01/2025 Occupation: Data Unavailable  Subjective:  No chief complaint on file.   History of Present Illness: [name] is a 74 y.o. male ***     Activities of Daily Living:  Patient reports morning stiffness for *** {minute/hour:19697}.   Patient {ACTIONS;DENIES/REPORTS:21021675::Denies} nocturnal pain.  Difficulty dressing/grooming: {ACTIONS;DENIES/REPORTS:21021675::Denies} Difficulty climbing stairs: {ACTIONS;DENIES/REPORTS:21021675::Denies} Difficulty getting out of chair: {ACTIONS;DENIES/REPORTS:21021675::Denies} Difficulty using hands for taps, buttons, cutlery, and/or writing: {ACTIONS;DENIES/REPORTS:21021675::Denies}  No Rheumatology ROS completed.   PMFS History:  Patient Active Problem List   Diagnosis Date Noted   Hypertension    Severe aortic stenosis 10/08/2019   Candidiasis    Abnormality of gait    Hypoalbuminemia due to protein-calorie malnutrition    History of gout    Sleep disturbance    Constipation    Rupture of quadriceps tendon, right, sequela 06/19/2017   Quadriceps tendon rupture, left, sequela 06/19/2017   Acute blood loss anemia 06/19/2017   Fall    History of subdural hematoma    Post-operative pain    Recurrent falls    Quadriceps tendon rupture 06/15/2017   Primary osteoarthritis of both knees 02/28/2017   Primary osteoarthritis of both hands 02/28/2017   Uricacidemia 02/28/2017   Pain in joint of left knee 02/07/2017   Idiopathic chronic gout, unspecified site, without tophus (tophi) 11/29/2016   HEMATURIA UNSPECIFIED 01/25/2009   Abdominal pain 01/25/2009   XERODERMA 03/10/2008   UNS ADVRS EFF UNS RX MEDICINAL&BIOLOGICAL SBSTNC 03/10/2008   ADJUSTMENT REACTION WITH PHYSICAL SYMPTOMS 02/14/2008    MUSCLE SPASM 02/14/2008   ACUTE PROSTATITIS 12/16/2007   BREAST LUMP OR MASS, RIGHT 07/12/2007   H/O: RCT (rotator cuff tear) 01/13/2007   GOUT 01/08/2007    Past Medical History:  Diagnosis Date   Arthritis    R shoulder, great toes, hands- has gout & has injections in knees with Dr. [name]    Bicuspid aortic valve with ascending aorta 4.0 to 4.5 cm in diameter    CAD (coronary artery disease)    Cancer (HCC)    skin ca-    GERD (gastroesophageal reflux disease)    Headache    History of hiatal hernia    Hypertension    Prostate cancer (HCC)    S/P TAVR (transcatheter aortic valve replacement)    SDH (subdural hematoma) (HCC)    Severe aortic stenosis    Thoracic ascending aortic aneurysm    Vertebral artery aneurysm     Family History  Problem Relation Age of Onset   Parkinson's disease Mother    Heart disease Father    Parkinson's disease Brother    Diabetes Brother    Past Surgical History:  Procedure Laterality Date   BRAIN SURGERY  2016   evacuation of SDH   CARDIAC CATHETERIZATION     CARPAL TUNNEL RELEASE Bilateral    embolization of left vertebral artery aneurysm with pipeline/coil  10/31/2022   GREEN LIGHT LASER TURP (TRANSURETHRAL RESECTION OF PROSTATE  04/27/2017   KNEE ARTHROPLASTY     LEFT HEART CATH AND CORONARY ANGIOGRAPHY N/A 10/08/2019   Procedure: LEFT HEART CATH AND CORONARY ANGIOGRAPHY;  Surgeon: Wonda Sharper, MD;  Location: [facility] INVASIVE  CV LAB;  Service: Cardiovascular;  Laterality: N/A;   QUADRICEPS TENDON REPAIR Bilateral 06/15/2017   Procedure: REPAIR QUADRICEP TENDON;  Surgeon: Addie Cordella Hamilton, MD;  Location: [facility] OR;  Service: Orthopedics;  Laterality: Bilateral;   SHOULDER SURGERY Right    TEE WITHOUT CARDIOVERSION N/A 07/03/2022   Procedure: TRANSESOPHAGEAL ECHOCARDIOGRAM (TEE);  Surgeon: Delford Maude BROCKS, MD;  Location: [facility] ENDOSCOPY;  Service: Cardiovascular;  Laterality: N/A;   TRANSCATHETER AORTIC  VALVE REPLACEMENT, TRANSFEMORAL  12/23/2019   TRANSCATHETER AORTIC VALVE REPLACEMENT, TRANSFEMORAL N/A 12/23/2019   Procedure: TRANSCATHETER AORTIC VALVE REPLACEMENT, TRANSFEMORAL;  Surgeon: Wonda Sharper, MD;  Location[facility] OR;  Service: Open Heart Surgery;  Laterality: N/A;   TRIGGER FINGER RELEASE Right 2025   3rd and 4th digit   VASCULAR SURGERY     Social History[1] Social History   Social History Narrative   Not on file     Immunization History  Administered Date(s) Administered   PFIZER(Purple Top)SARS-COV-2 Vaccination 02/19/2020, 03/17/2020, 10/25/2020, 08/30/2021   Pfizer Comirnaty Covid-19 vaccine 22yrs-92yrs 10/04/2022   Pfizer(Comirnaty)Fall Seasonal Vaccine 12 years and older 09/04/2024     Objective: Vital Signs: There were no vitals taken for this visit.   Physical Exam   Musculoskeletal Exam: ***  CDAI Exam: CDAI Score: -- Patient Global: --; Provider Global: -- Swollen: --; Tender: -- Joint Exam 01/01/2025   No joint exam has been documented for this visit   There is currently no information documented on the homunculus. Go to the Rheumatology activity and complete the homunculus joint exam.  Investigation: No additional findings.  Imaging: No results found.  Recent Labs: Lab Results  Component Value Date   WBC 8.1 09/25/2024   HGB 14.5 09/25/2024   PLT 149 (L) 09/25/2024   NA 143 09/25/2024   K 4.5 09/25/2024   CL 107 (H) 09/25/2024   CO2 21 09/25/2024   GLUCOSE 99 09/25/2024   BUN 18 09/25/2024   CREATININE 1.28 (H) 09/25/2024   BILITOT 0.3 09/25/2024   ALKPHOS 76 09/25/2024   AST 18 09/25/2024   ALT 19 09/25/2024   PROT 6.4 09/25/2024   ALBUMIN 4.2 09/25/2024   CALCIUM  9.6 09/25/2024   GFRAA 70 05/05/2021    Speciality Comments: No specialty comments available.  Procedures:  No procedures performed Allergies: Ciprofloxacin and Quinolones   Assessment / Plan:     Visit Diagnoses: No diagnosis found.  Orders: No  orders of the defined types were placed in this encounter.  No orders of the defined types were placed in this encounter.   Face-to-face time spent with patient was *** minutes. Greater than 50% of time was spent in counseling and coordination of care.  Follow-Up Instructions: No follow-ups on file.   Daved BROCKS Gavel, CMA  Note - This record has been created using Animal nutritionist.  Chart creation errors have been sought, but may not always  have been located. Such creation errors do not reflect on  the standard of medical care.     [1] Social History Tobacco Use   Smoking status: Never    Passive exposure: Never   Smokeless tobacco: Never  Vaping Use   Vaping status: Never Used  Substance Use Topics   Alcohol  use: Yes    Comment: 1 or 2 glasses of wine weekly   Drug use: No  "

## 2024-12-24 MED ORDER — ROSUVASTATIN CALCIUM 40 MG PO TABS: 40.0000 mg | ORAL_TABLET | Freq: Every day | ORAL | 2 refills | Status: AC

## 2024-12-29 ENCOUNTER — Other Ambulatory Visit: Payer: Self-pay

## 2024-12-29 ENCOUNTER — Encounter: Payer: Self-pay | Admitting: Orthopedic Surgery

## 2024-12-29 ENCOUNTER — Ambulatory Visit: Admitting: Orthopedic Surgery

## 2024-12-29 DIAGNOSIS — M19011 Primary osteoarthritis, right shoulder: Secondary | ICD-10-CM

## 2024-12-29 DIAGNOSIS — M25511 Pain in right shoulder: Secondary | ICD-10-CM

## 2024-12-29 NOTE — Progress Notes (Unsigned)
 "  Office Visit Note   Patient: Jose [name]           Date of Birth: 1951/09/25           MRN: [id number] Visit Date: 12/29/2024 Requested by: Birda Ahumada, MD [address], [address] [place],  [place] [address] PCP: Birda Ahumada, MD  Subjective: Chief Complaint  Patient presents with   Right Shoulder - Pain    HPI: Jose [name] is a 74 y.o. male who presents to the office reporting continued right shoulder pain.  Has known history of right shoulder arthritis.  Did have a glenohumeral joint injection in March and August of last year which helped.  He has tried physical therapy and Occupational Therapy upstairs.  He was actually doing very well until 4 weeks ago.  Takes occasional pain medication but not on a daily basis..                ROS: All systems reviewed are negative as they relate to the chief complaint within the history of present illness.  Patient denies fevers or chills.  Assessment & Plan: Visit Diagnoses:  1. Right shoulder pain, unspecified chronicity     Plan: Impression is symptomatic right shoulder arthritis with slight limitation of motion but overall maintained strength.  Tramadol  prescribed.  Ultrasound-guided injection performed today.  I think he could do 1 or possibly 2 more injections this year.  He may or may not be heading for shoulder replacement depending on how he is doing clinically.  Follow-up with us  as needed.  Follow-Up Instructions: No follow-ups on file.   Orders:  Orders Placed This Encounter  Procedures   US  Guided Needle Placement - No Linked Charges   No orders of the defined types were placed in this encounter.     Procedures: Large Joint Inj: R glenohumeral on 12/29/2024 10:37 PM Indications: diagnostic evaluation and pain Details: 22 G 3.5 in needle, ultrasound-guided posterior approach  Arthrogram: No  Medications: 9 mL bupivacaine  0.5 %; 5 mL lidocaine  1 %; 40 mg triamcinolone  acetonide 40 MG/ML Outcome: tolerated  well, no immediate complications Procedure, treatment alternatives, risks and benefits explained, specific risks discussed. Consent was given by the patient. Immediately prior to procedure a time out was called to verify the correct patient, procedure, equipment, support staff and site/side marked as required. Patient was prepped and draped in the usual sterile fashion.       Clinical Data: No additional findings.  Objective: Vital Signs: There were no vitals taken for this visit.  Physical Exam:  Constitutional: Patient appears well-developed HEENT:  Head: Normocephalic Eyes:EOM are normal Neck: Normal range of motion Cardiovascular: Normal rate Pulmonary/chest: Effort normal Neurologic: Patient is alert Skin: Skin is warm Psychiatric: Patient has normal mood and affect  Ortho Exam: Ortho exam demonstrates range of motion on the right of 45/95/135.  Rotator cuff strength intact infraspinatus supraspinatus and subscap muscle testing.  Does have a little bit of crepitus consistent with bone-on-bone arthritis and not necessarily rotator cuff pathology.  No discrete AC joint tenderness on the right.  Motor or sensory function in the hand is intact.  Specialty Comments:  No specialty comments available.  Imaging: US  Guided Needle Placement - No Linked Charges Result Date: 12/29/2024 Ultrasound imaging demonstrates needle placement into the glenohumeral joint with injection of fluid into the joint and no complicating features.     PMFS History: Patient Active Problem List   Diagnosis Date Noted   Hypertension  Severe aortic stenosis 10/08/2019   Candidiasis    Abnormality of gait    Hypoalbuminemia due to protein-calorie malnutrition    History of gout    Sleep disturbance    Constipation    Rupture of quadriceps tendon, right, sequela 06/19/2017   Quadriceps tendon rupture, left, sequela 06/19/2017   Acute blood loss anemia 06/19/2017   Fall    History of subdural  hematoma    Post-operative pain    Recurrent falls    Quadriceps tendon rupture 06/15/2017   Primary osteoarthritis of both knees 02/28/2017   Primary osteoarthritis of both hands 02/28/2017   Uricacidemia 02/28/2017   Pain in joint of left knee 02/07/2017   Idiopathic chronic gout, unspecified site, without tophus (tophi) 11/29/2016   HEMATURIA UNSPECIFIED 01/25/2009   Abdominal pain 01/25/2009   XERODERMA 03/10/2008   UNS ADVRS EFF UNS RX MEDICINAL&BIOLOGICAL SBSTNC 03/10/2008   ADJUSTMENT REACTION WITH PHYSICAL SYMPTOMS 02/14/2008   MUSCLE SPASM 02/14/2008   ACUTE PROSTATITIS 12/16/2007   BREAST LUMP OR MASS, RIGHT 07/12/2007   H/O: RCT (rotator cuff tear) 01/13/2007   GOUT 01/08/2007   Past Medical History:  Diagnosis Date   Arthritis    R shoulder, great toes, hands- has gout & has injections in knees with Dr. [name]    Bicuspid aortic valve with ascending aorta 4.0 to 4.5 cm in diameter    CAD (coronary artery disease)    Cancer (HCC)    skin ca-    GERD (gastroesophageal reflux disease)    Headache    History of hiatal hernia    Hypertension    Prostate cancer (HCC)    S/P TAVR (transcatheter aortic valve replacement)    SDH (subdural hematoma) (HCC)    Severe aortic stenosis    Thoracic ascending aortic aneurysm    Vertebral artery aneurysm     Family History  Problem Relation Age of Onset   Parkinson's disease Mother    Heart disease Father    Parkinson's disease Brother    Diabetes Brother     Past Surgical History:  Procedure Laterality Date   BRAIN SURGERY  2016   evacuation of SDH   CARDIAC CATHETERIZATION     CARPAL TUNNEL RELEASE Bilateral    embolization of left vertebral artery aneurysm with pipeline/coil  10/31/2022   GREEN LIGHT LASER TURP (TRANSURETHRAL RESECTION OF PROSTATE  04/27/2017   KNEE ARTHROPLASTY     LEFT HEART CATH AND CORONARY ANGIOGRAPHY N/A 10/08/2019   Procedure: LEFT HEART CATH AND CORONARY ANGIOGRAPHY;  Surgeon: Wonda Sharper, MD;  Location: [facility] INVASIVE CV LAB;  Service: Cardiovascular;  Laterality: N/A;   QUADRICEPS TENDON REPAIR Bilateral 06/15/2017   Procedure: REPAIR QUADRICEP TENDON;  Surgeon: Addie Cordella Hamilton, MD;  Location: [facility];  Service: Orthopedics;  Laterality: Bilateral;   SHOULDER SURGERY Right    TEE WITHOUT CARDIOVERSION N/A 07/03/2022   Procedure: TRANSESOPHAGEAL ECHOCARDIOGRAM (TEE);  Surgeon: Delford Maude BROCKS, MD;  Location: [facility] ENDOSCOPY;  Service: Cardiovascular;  Laterality: N/A;   TRANSCATHETER AORTIC VALVE REPLACEMENT, TRANSFEMORAL  12/23/2019   TRANSCATHETER AORTIC VALVE REPLACEMENT, TRANSFEMORAL N/A 12/23/2019   Procedure: TRANSCATHETER AORTIC VALVE REPLACEMENT, TRANSFEMORAL;  Surgeon: Wonda Sharper, MD;  Location: [facility] OR;  Service: Open Heart Surgery;  Laterality: N/A;   TRIGGER FINGER RELEASE Right 2025   3rd and 4th digit   VASCULAR SURGERY     Social History   Occupational History   Not on file  Tobacco Use   Smoking status: Never  Passive exposure: Never   Smokeless tobacco: Never  Vaping Use   Vaping status: Never Used  Substance and Sexual Activity   Alcohol  use: Yes    Comment: 1 or 2 glasses of wine weekly   Drug use: No   Sexual activity: Not on file        "

## 2024-12-30 ENCOUNTER — Other Ambulatory Visit: Payer: Self-pay | Admitting: Orthopedic Surgery

## 2024-12-30 ENCOUNTER — Encounter: Payer: Self-pay | Admitting: Orthopedic Surgery

## 2024-12-30 MED ORDER — TRAMADOL HCL 50 MG PO TABS: 50.0000 mg | ORAL_TABLET | Freq: Four times a day (QID) | ORAL | 0 refills | Status: AC | PRN

## 2025-01-01 ENCOUNTER — Ambulatory Visit: Admitting: Rheumatology

## 2025-01-01 DIAGNOSIS — S76111S Strain of right quadriceps muscle, fascia and tendon, sequela: Secondary | ICD-10-CM

## 2025-01-01 DIAGNOSIS — G8929 Other chronic pain: Secondary | ICD-10-CM

## 2025-01-01 DIAGNOSIS — M1A09X0 Idiopathic chronic gout, multiple sites, without tophus (tophi): Secondary | ICD-10-CM

## 2025-01-01 DIAGNOSIS — G629 Polyneuropathy, unspecified: Secondary | ICD-10-CM

## 2025-01-01 DIAGNOSIS — M65341 Trigger finger, right ring finger: Secondary | ICD-10-CM

## 2025-01-01 DIAGNOSIS — M170 Bilateral primary osteoarthritis of knee: Secondary | ICD-10-CM

## 2025-01-01 DIAGNOSIS — M65331 Trigger finger, right middle finger: Secondary | ICD-10-CM

## 2025-01-01 DIAGNOSIS — Z952 Presence of prosthetic heart valve: Secondary | ICD-10-CM

## 2025-01-01 DIAGNOSIS — S76112S Strain of left quadriceps muscle, fascia and tendon, sequela: Secondary | ICD-10-CM

## 2025-01-01 DIAGNOSIS — M19071 Primary osteoarthritis, right ankle and foot: Secondary | ICD-10-CM

## 2025-01-01 DIAGNOSIS — A809 Acute poliomyelitis, unspecified: Secondary | ICD-10-CM

## 2025-01-01 DIAGNOSIS — Z5181 Encounter for therapeutic drug level monitoring: Secondary | ICD-10-CM

## 2025-01-01 DIAGNOSIS — S065XAA Traumatic subdural hemorrhage with loss of consciousness status unknown, initial encounter: Secondary | ICD-10-CM

## 2025-01-01 DIAGNOSIS — M19042 Primary osteoarthritis, left hand: Secondary | ICD-10-CM

## 2025-01-02 MED ORDER — BUPIVACAINE HCL 0.5 % IJ SOLN
9.0000 mL | INTRAMUSCULAR | Status: AC | PRN
  Administered 2024-12-29: 9 mL via INTRA_ARTICULAR

## 2025-01-02 MED ORDER — LIDOCAINE HCL 1 % IJ SOLN
5.0000 mL | INTRAMUSCULAR | Status: AC | PRN
  Administered 2024-12-29: 5 mL

## 2025-01-02 MED ORDER — TRIAMCINOLONE ACETONIDE 40 MG/ML IJ SUSP
40.0000 mg | INTRAMUSCULAR | Status: AC | PRN
  Administered 2024-12-29: 40 mg via INTRA_ARTICULAR

## 2025-01-05 ENCOUNTER — Ambulatory Visit: Admitting: Orthopedic Surgery

## 2025-02-12 ENCOUNTER — Ambulatory Visit: Admitting: Rheumatology
